# Patient Record
Sex: Female | Born: 1945 | Race: White | Hispanic: No | Smoking: Never smoker
Health system: Southern US, Community
[De-identification: ages and names within clinical notes are randomized; demographics above are authoritative.]

## PROBLEM LIST (undated history)

## (undated) DIAGNOSIS — I471 Supraventricular tachycardia, unspecified: Secondary | ICD-10-CM

## (undated) DIAGNOSIS — N390 Urinary tract infection, site not specified: Secondary | ICD-10-CM

## (undated) DIAGNOSIS — E349 Endocrine disorder, unspecified: Secondary | ICD-10-CM

## (undated) DIAGNOSIS — K5792 Diverticulitis of intestine, part unspecified, without perforation or abscess without bleeding: Secondary | ICD-10-CM

## (undated) DIAGNOSIS — E039 Hypothyroidism, unspecified: Secondary | ICD-10-CM

## (undated) DIAGNOSIS — I340 Nonrheumatic mitral (valve) insufficiency: Secondary | ICD-10-CM

## (undated) DIAGNOSIS — U071 COVID-19: Secondary | ICD-10-CM

## (undated) DIAGNOSIS — A419 Sepsis, unspecified organism: Secondary | ICD-10-CM

## (undated) DIAGNOSIS — B019 Varicella without complication: Secondary | ICD-10-CM

## (undated) DIAGNOSIS — I517 Cardiomegaly: Secondary | ICD-10-CM

## (undated) DIAGNOSIS — I491 Atrial premature depolarization: Secondary | ICD-10-CM

## (undated) DIAGNOSIS — M810 Age-related osteoporosis without current pathological fracture: Secondary | ICD-10-CM

## (undated) DIAGNOSIS — Z923 Personal history of irradiation: Secondary | ICD-10-CM

## (undated) DIAGNOSIS — I493 Ventricular premature depolarization: Secondary | ICD-10-CM

## (undated) DIAGNOSIS — K219 Gastro-esophageal reflux disease without esophagitis: Secondary | ICD-10-CM

## (undated) DIAGNOSIS — E079 Disorder of thyroid, unspecified: Secondary | ICD-10-CM

## (undated) DIAGNOSIS — K209 Esophagitis, unspecified without bleeding: Secondary | ICD-10-CM

## (undated) DIAGNOSIS — E785 Hyperlipidemia, unspecified: Secondary | ICD-10-CM

## (undated) DIAGNOSIS — M1990 Unspecified osteoarthritis, unspecified site: Secondary | ICD-10-CM

## (undated) DIAGNOSIS — C50919 Malignant neoplasm of unspecified site of unspecified female breast: Secondary | ICD-10-CM

## (undated) DIAGNOSIS — I10 Essential (primary) hypertension: Secondary | ICD-10-CM

## (undated) DIAGNOSIS — Z87442 Personal history of urinary calculi: Secondary | ICD-10-CM

## (undated) DIAGNOSIS — K5790 Diverticulosis of intestine, part unspecified, without perforation or abscess without bleeding: Secondary | ICD-10-CM

## (undated) HISTORY — DX: COVID-19: U07.1

## (undated) HISTORY — DX: Urinary tract infection, site not specified: N39.0

## (undated) HISTORY — PX: BONE GRAFT HIP ILIAC CREST: SUR159

## (undated) HISTORY — PX: TONSILLECTOMY: SUR1361

## (undated) HISTORY — DX: Endocrine disorder, unspecified: E34.9

## (undated) HISTORY — PX: HERNIA REPAIR: SHX51

## (undated) HISTORY — PX: TUBAL LIGATION: SHX77

## (undated) HISTORY — DX: Disorder of thyroid, unspecified: E07.9

## (undated) HISTORY — PX: JOINT REPLACEMENT: SHX530

## (undated) HISTORY — DX: Diverticulosis of intestine, part unspecified, without perforation or abscess without bleeding: K57.90

## (undated) HISTORY — DX: Essential (primary) hypertension: I10

## (undated) HISTORY — PX: CARDIAC CATHETERIZATION: SHX172

## (undated) HISTORY — PX: SPINAL FUSION: SHX223

## (undated) HISTORY — PX: SHOULDER SURGERY: SHX246

## (undated) HISTORY — DX: Hyperlipidemia, unspecified: E78.5

## (undated) HISTORY — DX: Esophagitis, unspecified without bleeding: K20.90

## (undated) HISTORY — DX: Gastro-esophageal reflux disease without esophagitis: K21.9

## (undated) HISTORY — DX: Malignant neoplasm of unspecified site of unspecified female breast: C50.919

## (undated) HISTORY — DX: Unspecified osteoarthritis, unspecified site: M19.90

## (undated) HISTORY — PX: OTHER SURGICAL HISTORY: SHX169

## (undated) HISTORY — DX: Varicella without complication: B01.9

## (undated) HISTORY — PX: SPINE SURGERY: SHX786

## (undated) HISTORY — PX: TOTAL SHOULDER REPLACEMENT: SUR1217

## (undated) HISTORY — PX: EYE SURGERY: SHX253

---

## 1999-11-26 HISTORY — PX: CARDIAC CATHETERIZATION: SHX172

## 2007-11-26 DIAGNOSIS — Z923 Personal history of irradiation: Secondary | ICD-10-CM

## 2007-11-26 DIAGNOSIS — C50919 Malignant neoplasm of unspecified site of unspecified female breast: Secondary | ICD-10-CM

## 2007-11-26 HISTORY — PX: BREAST LUMPECTOMY: SHX2

## 2007-11-26 HISTORY — DX: Personal history of irradiation: Z92.3

## 2007-11-26 HISTORY — DX: Malignant neoplasm of unspecified site of unspecified female breast: C50.919

## 2007-11-26 HISTORY — PX: BREAST BIOPSY: SHX20

## 2010-11-25 HISTORY — PX: TOTAL SHOULDER REPLACEMENT: SUR1217

## 2012-06-03 DIAGNOSIS — Z833 Family history of diabetes mellitus: Secondary | ICD-10-CM | POA: Insufficient documentation

## 2012-06-03 DIAGNOSIS — Z8269 Family history of other diseases of the musculoskeletal system and connective tissue: Secondary | ICD-10-CM | POA: Insufficient documentation

## 2012-06-03 DIAGNOSIS — M4310 Spondylolisthesis, site unspecified: Secondary | ICD-10-CM | POA: Insufficient documentation

## 2012-06-03 DIAGNOSIS — C801 Malignant (primary) neoplasm, unspecified: Secondary | ICD-10-CM | POA: Insufficient documentation

## 2012-06-03 DIAGNOSIS — M48061 Spinal stenosis, lumbar region without neurogenic claudication: Secondary | ICD-10-CM | POA: Insufficient documentation

## 2013-02-15 DIAGNOSIS — I10 Essential (primary) hypertension: Secondary | ICD-10-CM | POA: Insufficient documentation

## 2013-02-15 DIAGNOSIS — E039 Hypothyroidism, unspecified: Secondary | ICD-10-CM | POA: Insufficient documentation

## 2013-02-15 DIAGNOSIS — E785 Hyperlipidemia, unspecified: Secondary | ICD-10-CM | POA: Insufficient documentation

## 2013-02-15 DIAGNOSIS — J309 Allergic rhinitis, unspecified: Secondary | ICD-10-CM | POA: Insufficient documentation

## 2014-05-17 LAB — HM COLONOSCOPY

## 2014-06-14 DIAGNOSIS — G609 Hereditary and idiopathic neuropathy, unspecified: Secondary | ICD-10-CM | POA: Insufficient documentation

## 2015-03-29 DIAGNOSIS — N200 Calculus of kidney: Secondary | ICD-10-CM | POA: Insufficient documentation

## 2015-11-08 DIAGNOSIS — M5417 Radiculopathy, lumbosacral region: Secondary | ICD-10-CM | POA: Insufficient documentation

## 2015-11-08 DIAGNOSIS — M47817 Spondylosis without myelopathy or radiculopathy, lumbosacral region: Secondary | ICD-10-CM | POA: Insufficient documentation

## 2015-11-08 DIAGNOSIS — M51369 Other intervertebral disc degeneration, lumbar region without mention of lumbar back pain or lower extremity pain: Secondary | ICD-10-CM | POA: Insufficient documentation

## 2015-11-08 DIAGNOSIS — M5136 Other intervertebral disc degeneration, lumbar region: Secondary | ICD-10-CM | POA: Insufficient documentation

## 2016-11-25 HISTORY — PX: BONE GRAFT HIP ILIAC CREST: SUR159

## 2016-12-06 DIAGNOSIS — I10 Essential (primary) hypertension: Secondary | ICD-10-CM | POA: Diagnosis not present

## 2016-12-06 DIAGNOSIS — R197 Diarrhea, unspecified: Secondary | ICD-10-CM | POA: Diagnosis not present

## 2016-12-06 DIAGNOSIS — K4020 Bilateral inguinal hernia, without obstruction or gangrene, not specified as recurrent: Secondary | ICD-10-CM | POA: Diagnosis not present

## 2016-12-06 DIAGNOSIS — K5790 Diverticulosis of intestine, part unspecified, without perforation or abscess without bleeding: Secondary | ICD-10-CM | POA: Diagnosis not present

## 2016-12-06 DIAGNOSIS — R1030 Lower abdominal pain, unspecified: Secondary | ICD-10-CM | POA: Diagnosis not present

## 2016-12-06 DIAGNOSIS — R1031 Right lower quadrant pain: Secondary | ICD-10-CM | POA: Diagnosis not present

## 2016-12-10 DIAGNOSIS — R1031 Right lower quadrant pain: Secondary | ICD-10-CM | POA: Diagnosis not present

## 2016-12-10 DIAGNOSIS — R197 Diarrhea, unspecified: Secondary | ICD-10-CM | POA: Diagnosis not present

## 2016-12-10 DIAGNOSIS — R1030 Lower abdominal pain, unspecified: Secondary | ICD-10-CM | POA: Diagnosis not present

## 2016-12-11 DIAGNOSIS — S52515D Nondisplaced fracture of left radial styloid process, subsequent encounter for closed fracture with routine healing: Secondary | ICD-10-CM | POA: Diagnosis not present

## 2016-12-11 DIAGNOSIS — S63522D Sprain of radiocarpal joint of left wrist, subsequent encounter: Secondary | ICD-10-CM | POA: Diagnosis not present

## 2016-12-11 DIAGNOSIS — M25532 Pain in left wrist: Secondary | ICD-10-CM | POA: Diagnosis not present

## 2016-12-26 DIAGNOSIS — M545 Low back pain: Secondary | ICD-10-CM | POA: Diagnosis not present

## 2016-12-26 DIAGNOSIS — M542 Cervicalgia: Secondary | ICD-10-CM | POA: Diagnosis not present

## 2016-12-26 DIAGNOSIS — M4186 Other forms of scoliosis, lumbar region: Secondary | ICD-10-CM | POA: Diagnosis not present

## 2016-12-31 DIAGNOSIS — M5416 Radiculopathy, lumbar region: Secondary | ICD-10-CM | POA: Diagnosis not present

## 2016-12-31 DIAGNOSIS — M4720 Other spondylosis with radiculopathy, site unspecified: Secondary | ICD-10-CM | POA: Diagnosis not present

## 2016-12-31 DIAGNOSIS — M5137 Other intervertebral disc degeneration, lumbosacral region: Secondary | ICD-10-CM | POA: Diagnosis not present

## 2017-01-06 DIAGNOSIS — S63522D Sprain of radiocarpal joint of left wrist, subsequent encounter: Secondary | ICD-10-CM | POA: Diagnosis not present

## 2017-01-06 DIAGNOSIS — S52515D Nondisplaced fracture of left radial styloid process, subsequent encounter for closed fracture with routine healing: Secondary | ICD-10-CM | POA: Diagnosis not present

## 2017-01-06 DIAGNOSIS — M1812 Unilateral primary osteoarthritis of first carpometacarpal joint, left hand: Secondary | ICD-10-CM | POA: Diagnosis not present

## 2017-01-08 DIAGNOSIS — L538 Other specified erythematous conditions: Secondary | ICD-10-CM | POA: Diagnosis not present

## 2017-01-08 DIAGNOSIS — D2239 Melanocytic nevi of other parts of face: Secondary | ICD-10-CM | POA: Diagnosis not present

## 2017-01-08 DIAGNOSIS — L814 Other melanin hyperpigmentation: Secondary | ICD-10-CM | POA: Diagnosis not present

## 2017-01-08 DIAGNOSIS — R208 Other disturbances of skin sensation: Secondary | ICD-10-CM | POA: Diagnosis not present

## 2017-01-08 DIAGNOSIS — L218 Other seborrheic dermatitis: Secondary | ICD-10-CM | POA: Diagnosis not present

## 2017-01-08 DIAGNOSIS — L820 Inflamed seborrheic keratosis: Secondary | ICD-10-CM | POA: Diagnosis not present

## 2017-01-08 DIAGNOSIS — L821 Other seborrheic keratosis: Secondary | ICD-10-CM | POA: Diagnosis not present

## 2017-01-16 DIAGNOSIS — M4186 Other forms of scoliosis, lumbar region: Secondary | ICD-10-CM | POA: Diagnosis not present

## 2017-02-14 DIAGNOSIS — M654 Radial styloid tenosynovitis [de Quervain]: Secondary | ICD-10-CM | POA: Diagnosis not present

## 2017-02-28 DIAGNOSIS — E039 Hypothyroidism, unspecified: Secondary | ICD-10-CM | POA: Diagnosis not present

## 2017-02-28 DIAGNOSIS — R1032 Left lower quadrant pain: Secondary | ICD-10-CM | POA: Diagnosis not present

## 2017-02-28 DIAGNOSIS — I10 Essential (primary) hypertension: Secondary | ICD-10-CM | POA: Diagnosis not present

## 2017-02-28 DIAGNOSIS — E785 Hyperlipidemia, unspecified: Secondary | ICD-10-CM | POA: Diagnosis not present

## 2017-03-11 DIAGNOSIS — M545 Low back pain: Secondary | ICD-10-CM | POA: Diagnosis not present

## 2017-03-11 DIAGNOSIS — Z0000 Encounter for general adult medical examination without abnormal findings: Secondary | ICD-10-CM | POA: Diagnosis not present

## 2017-03-11 DIAGNOSIS — E785 Hyperlipidemia, unspecified: Secondary | ICD-10-CM | POA: Diagnosis not present

## 2017-03-11 DIAGNOSIS — I10 Essential (primary) hypertension: Secondary | ICD-10-CM | POA: Diagnosis not present

## 2017-03-11 DIAGNOSIS — E039 Hypothyroidism, unspecified: Secondary | ICD-10-CM | POA: Diagnosis not present

## 2017-03-11 DIAGNOSIS — Z6828 Body mass index (BMI) 28.0-28.9, adult: Secondary | ICD-10-CM | POA: Diagnosis not present

## 2017-03-11 DIAGNOSIS — Z1231 Encounter for screening mammogram for malignant neoplasm of breast: Secondary | ICD-10-CM | POA: Diagnosis not present

## 2017-04-01 DIAGNOSIS — I10 Essential (primary) hypertension: Secondary | ICD-10-CM | POA: Diagnosis not present

## 2017-04-01 DIAGNOSIS — Z7951 Long term (current) use of inhaled steroids: Secondary | ICD-10-CM | POA: Diagnosis not present

## 2017-04-01 DIAGNOSIS — M4316 Spondylolisthesis, lumbar region: Secondary | ICD-10-CM | POA: Diagnosis not present

## 2017-04-01 DIAGNOSIS — M79651 Pain in right thigh: Secondary | ICD-10-CM | POA: Diagnosis not present

## 2017-04-01 DIAGNOSIS — Z79891 Long term (current) use of opiate analgesic: Secondary | ICD-10-CM | POA: Diagnosis not present

## 2017-04-01 DIAGNOSIS — M4184 Other forms of scoliosis, thoracic region: Secondary | ICD-10-CM | POA: Diagnosis not present

## 2017-04-01 DIAGNOSIS — Z8249 Family history of ischemic heart disease and other diseases of the circulatory system: Secondary | ICD-10-CM | POA: Diagnosis not present

## 2017-04-01 DIAGNOSIS — M5136 Other intervertebral disc degeneration, lumbar region: Secondary | ICD-10-CM | POA: Diagnosis not present

## 2017-04-01 DIAGNOSIS — M1990 Unspecified osteoarthritis, unspecified site: Secondary | ICD-10-CM | POA: Diagnosis not present

## 2017-04-01 DIAGNOSIS — M4185 Other forms of scoliosis, thoracolumbar region: Secondary | ICD-10-CM | POA: Diagnosis not present

## 2017-04-01 DIAGNOSIS — Z79899 Other long term (current) drug therapy: Secondary | ICD-10-CM | POA: Diagnosis not present

## 2017-04-01 DIAGNOSIS — Z8262 Family history of osteoporosis: Secondary | ICD-10-CM | POA: Diagnosis not present

## 2017-04-01 DIAGNOSIS — Z87440 Personal history of urinary (tract) infections: Secondary | ICD-10-CM | POA: Diagnosis not present

## 2017-04-01 DIAGNOSIS — M79652 Pain in left thigh: Secondary | ICD-10-CM | POA: Diagnosis not present

## 2017-04-01 DIAGNOSIS — Z809 Family history of malignant neoplasm, unspecified: Secondary | ICD-10-CM | POA: Diagnosis not present

## 2017-04-01 DIAGNOSIS — Z882 Allergy status to sulfonamides status: Secondary | ICD-10-CM | POA: Diagnosis not present

## 2017-04-01 DIAGNOSIS — Z6828 Body mass index (BMI) 28.0-28.9, adult: Secondary | ICD-10-CM | POA: Diagnosis not present

## 2017-04-01 DIAGNOSIS — E079 Disorder of thyroid, unspecified: Secondary | ICD-10-CM | POA: Diagnosis not present

## 2017-04-01 DIAGNOSIS — M545 Low back pain: Secondary | ICD-10-CM | POA: Diagnosis not present

## 2017-04-01 DIAGNOSIS — Z859 Personal history of malignant neoplasm, unspecified: Secondary | ICD-10-CM | POA: Diagnosis not present

## 2017-04-01 DIAGNOSIS — C9001 Multiple myeloma in remission: Secondary | ICD-10-CM | POA: Diagnosis not present

## 2017-04-04 DIAGNOSIS — N390 Urinary tract infection, site not specified: Secondary | ICD-10-CM | POA: Diagnosis not present

## 2017-04-04 DIAGNOSIS — R109 Unspecified abdominal pain: Secondary | ICD-10-CM | POA: Diagnosis not present

## 2017-04-10 DIAGNOSIS — Z1231 Encounter for screening mammogram for malignant neoplasm of breast: Secondary | ICD-10-CM | POA: Diagnosis not present

## 2017-04-10 LAB — HM MAMMOGRAPHY

## 2017-04-29 DIAGNOSIS — Z01419 Encounter for gynecological examination (general) (routine) without abnormal findings: Secondary | ICD-10-CM | POA: Diagnosis not present

## 2017-04-29 DIAGNOSIS — N8110 Cystocele, unspecified: Secondary | ICD-10-CM | POA: Diagnosis not present

## 2017-04-29 DIAGNOSIS — N390 Urinary tract infection, site not specified: Secondary | ICD-10-CM | POA: Diagnosis not present

## 2017-04-29 DIAGNOSIS — Z6828 Body mass index (BMI) 28.0-28.9, adult: Secondary | ICD-10-CM | POA: Diagnosis not present

## 2017-05-05 DIAGNOSIS — N390 Urinary tract infection, site not specified: Secondary | ICD-10-CM | POA: Diagnosis not present

## 2017-05-29 DIAGNOSIS — M48061 Spinal stenosis, lumbar region without neurogenic claudication: Secondary | ICD-10-CM | POA: Diagnosis not present

## 2017-05-29 DIAGNOSIS — G8929 Other chronic pain: Secondary | ICD-10-CM | POA: Diagnosis not present

## 2017-05-29 DIAGNOSIS — M5417 Radiculopathy, lumbosacral region: Secondary | ICD-10-CM | POA: Diagnosis not present

## 2017-05-29 DIAGNOSIS — M47817 Spondylosis without myelopathy or radiculopathy, lumbosacral region: Secondary | ICD-10-CM | POA: Diagnosis not present

## 2017-06-03 DIAGNOSIS — N390 Urinary tract infection, site not specified: Secondary | ICD-10-CM | POA: Diagnosis not present

## 2017-06-16 DIAGNOSIS — M48061 Spinal stenosis, lumbar region without neurogenic claudication: Secondary | ICD-10-CM | POA: Diagnosis not present

## 2017-07-15 DIAGNOSIS — R300 Dysuria: Secondary | ICD-10-CM | POA: Diagnosis not present

## 2017-07-15 DIAGNOSIS — Z87442 Personal history of urinary calculi: Secondary | ICD-10-CM | POA: Diagnosis not present

## 2017-07-15 DIAGNOSIS — N200 Calculus of kidney: Secondary | ICD-10-CM | POA: Diagnosis not present

## 2017-07-15 DIAGNOSIS — Z87440 Personal history of urinary (tract) infections: Secondary | ICD-10-CM | POA: Diagnosis not present

## 2017-07-21 DIAGNOSIS — N390 Urinary tract infection, site not specified: Secondary | ICD-10-CM | POA: Diagnosis not present

## 2017-07-21 DIAGNOSIS — N200 Calculus of kidney: Secondary | ICD-10-CM | POA: Diagnosis not present

## 2017-07-21 DIAGNOSIS — Z87440 Personal history of urinary (tract) infections: Secondary | ICD-10-CM | POA: Diagnosis not present

## 2017-07-21 DIAGNOSIS — R300 Dysuria: Secondary | ICD-10-CM | POA: Diagnosis not present

## 2017-07-30 DIAGNOSIS — N898 Other specified noninflammatory disorders of vagina: Secondary | ICD-10-CM | POA: Diagnosis not present

## 2017-07-30 DIAGNOSIS — Z87440 Personal history of urinary (tract) infections: Secondary | ICD-10-CM | POA: Diagnosis not present

## 2017-07-30 DIAGNOSIS — R8290 Unspecified abnormal findings in urine: Secondary | ICD-10-CM | POA: Diagnosis not present

## 2017-08-02 DIAGNOSIS — Z23 Encounter for immunization: Secondary | ICD-10-CM | POA: Diagnosis not present

## 2017-08-03 IMAGING — US US EXTREM LOW VENOUS*L*
1 series · 13 of 24 positions shown · non-contrast
Comparison: None.

CLINICAL DATA: Left lower extremity swelling and pain



[Series 1: us extrem low venous*left* · 0.06mm/px · 13 of 42 slices shown]
[im 1/42]
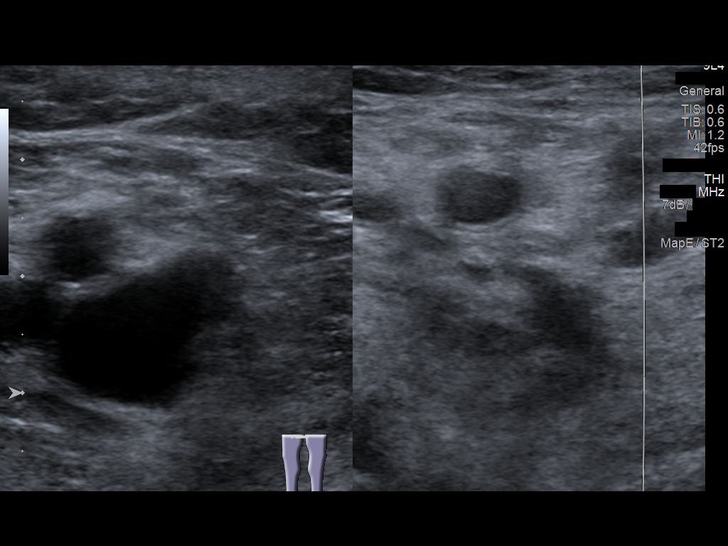
[im 4/42]
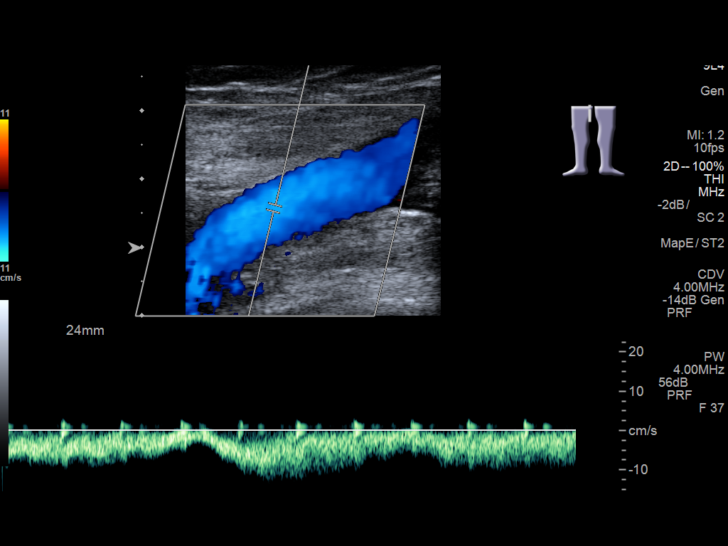
[im 8/42]
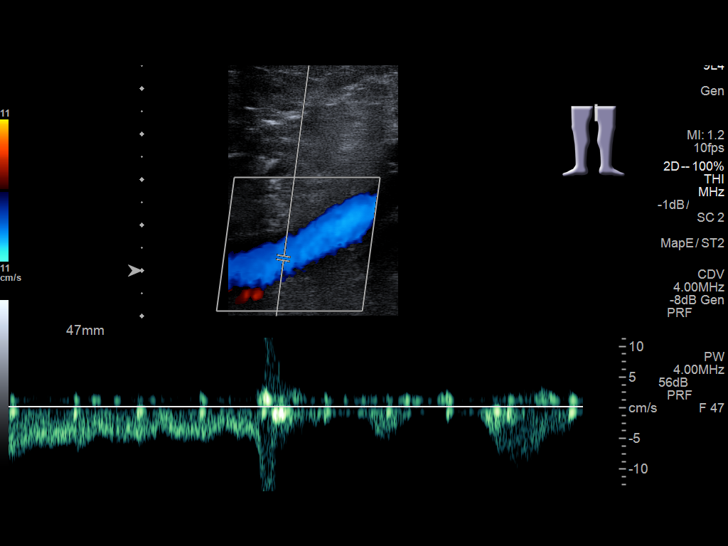
[im 11/42]
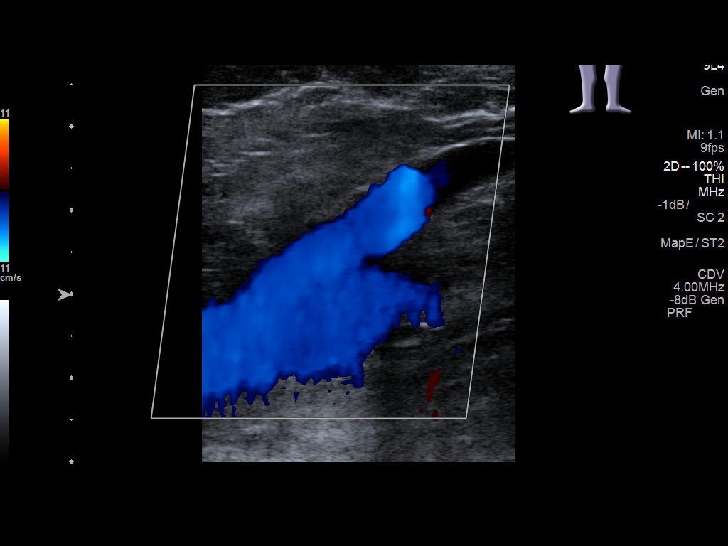
[im 15/42]
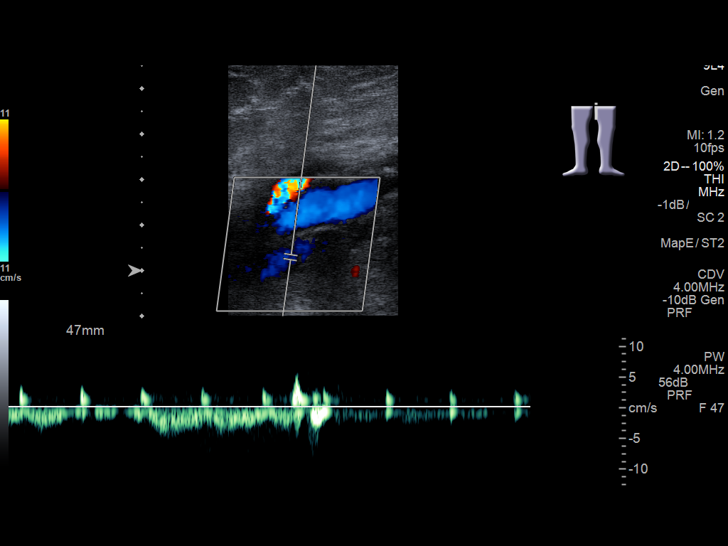
[im 18/42]
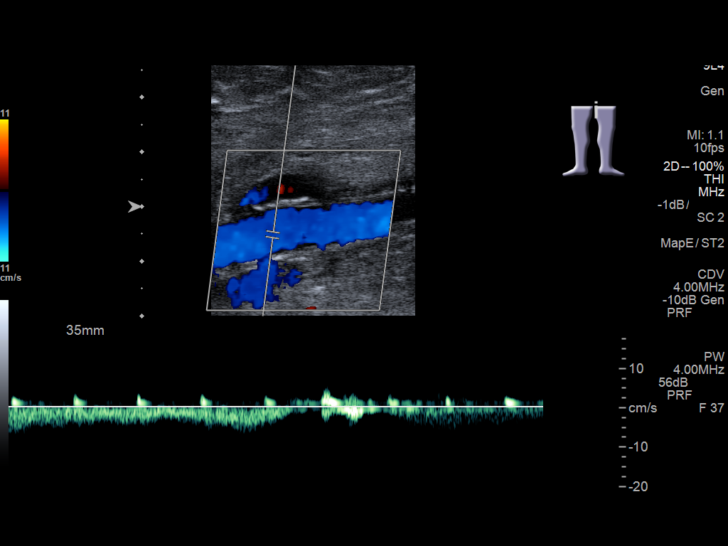
[im 22/42]
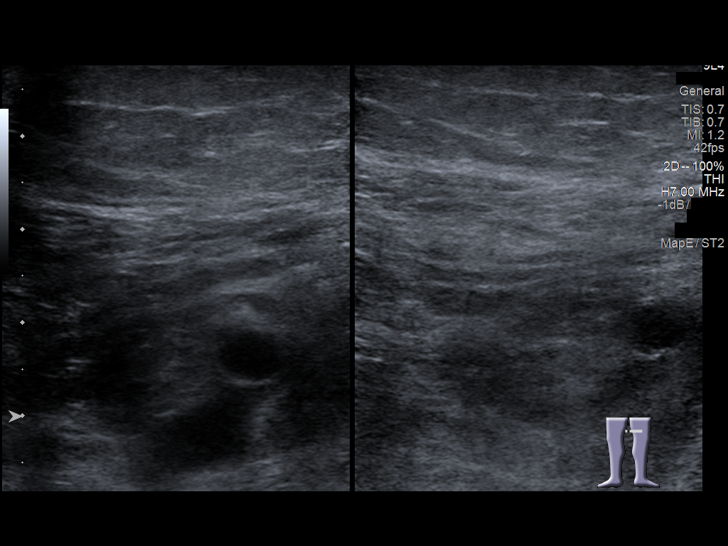
[im 24/42]
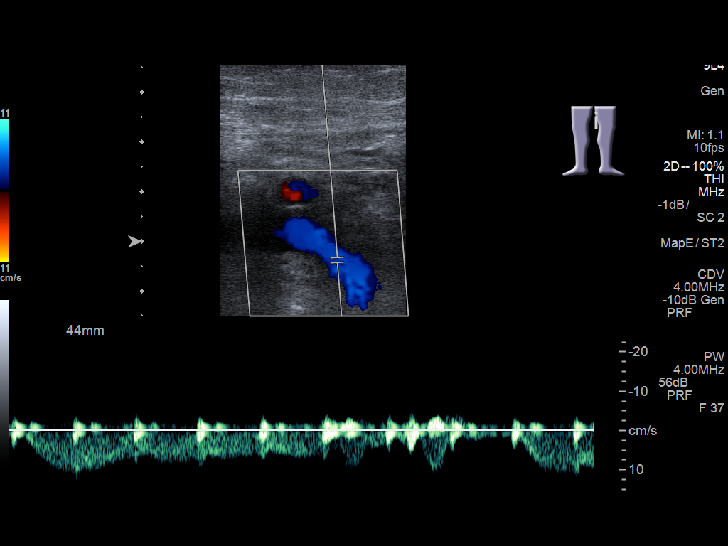
[im 27/42]
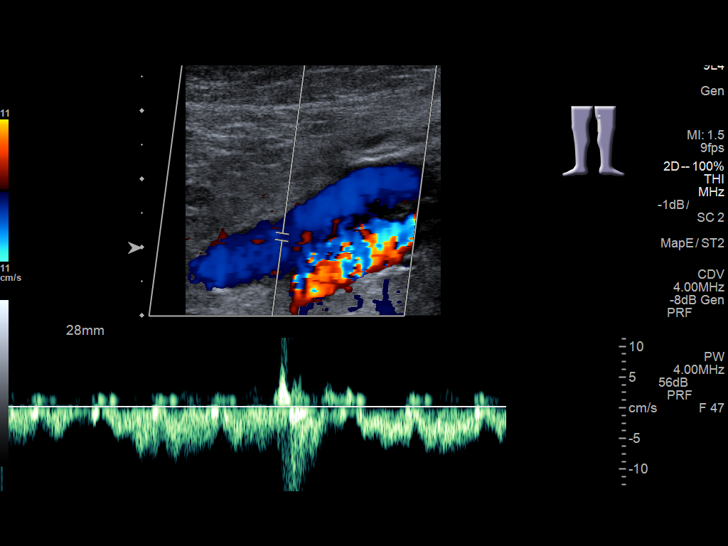
[im 31/42]
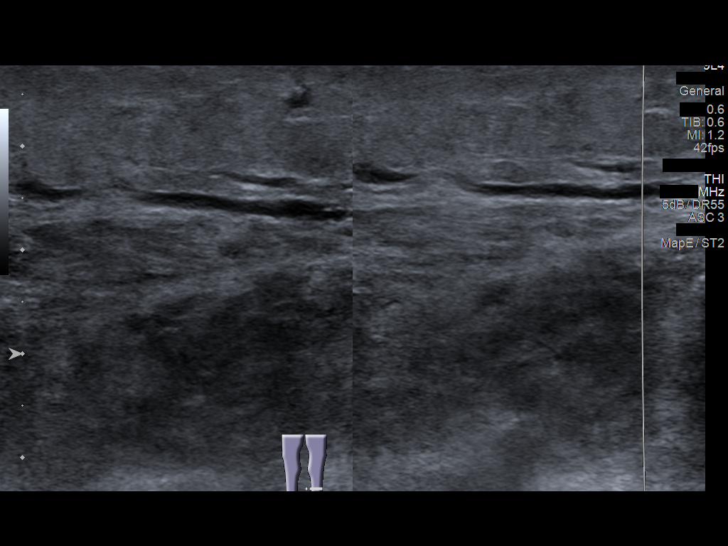
[im 34/42]
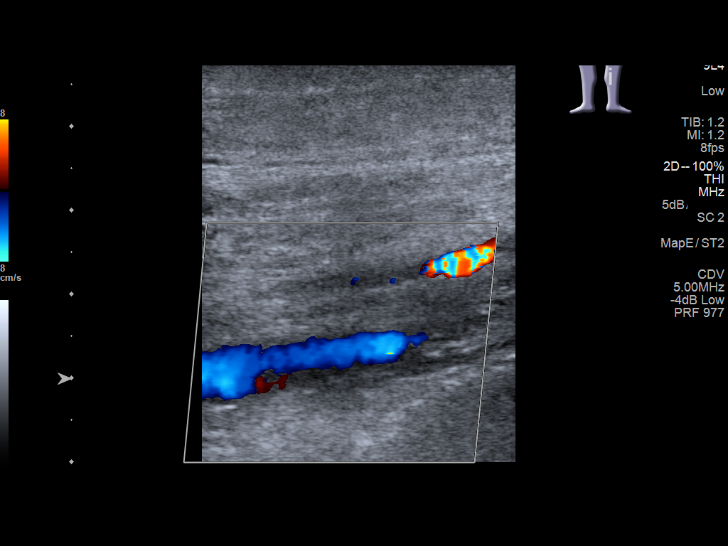
[im 38/42]
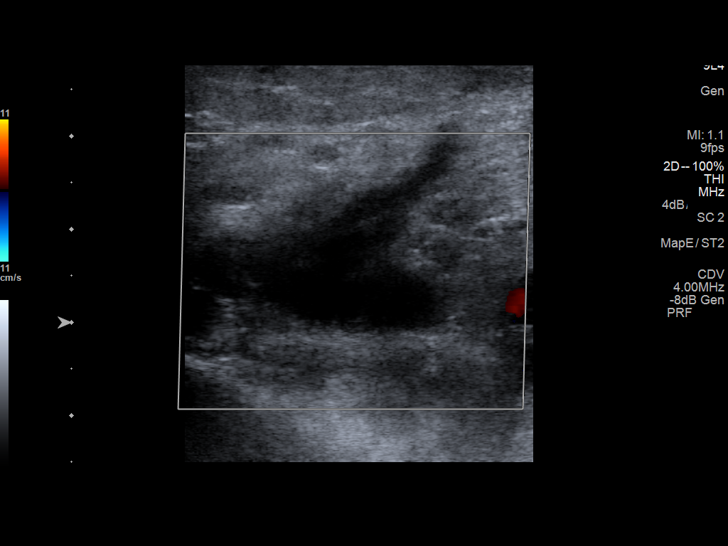
[im 42/42]
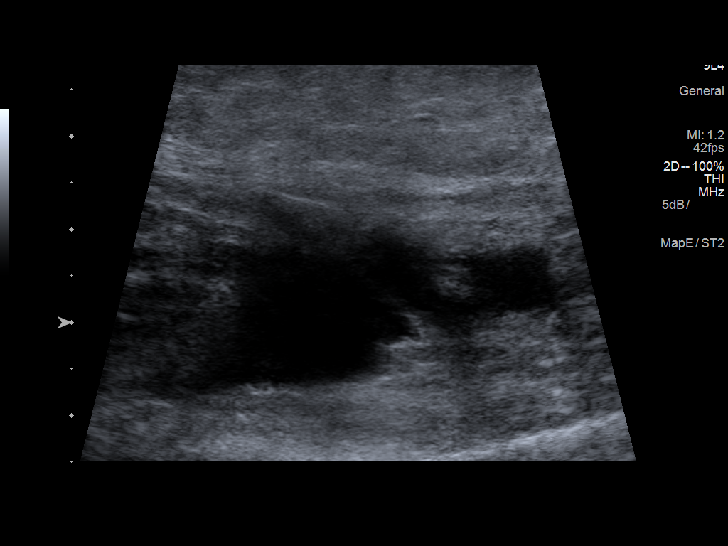

[13 of 24 positions shown; findings below may reference images not displayed]

FINDINGS: Contralateral Common Femoral Vein: Respiratory phasicity is normal
and symmetric with the symptomatic side. No evidence of thrombus.
Normal compressibility.

Common Femoral Vein: No evidence of thrombus. Normal
compressibility, respiratory phasicity and response to augmentation.

Saphenofemoral Junction: No evidence of thrombus. Normal
compressibility and flow on color Doppler imaging.

Profunda Femoral Vein: No evidence of thrombus. Normal
compressibility and flow on color Doppler imaging.

Femoral Vein: No evidence of thrombus. Normal compressibility,
respiratory phasicity and response to augmentation.

Popliteal Vein: No evidence of thrombus. Normal compressibility,
respiratory phasicity and response to augmentation.

Calf Veins: No evidence of thrombus. Normal compressibility and flow
on color Doppler imaging.

Superficial Great Saphenous Vein: No evidence of thrombus. Normal
compressibility.

Venous Reflux:  None.

Other Findings: 4.1 x 1.3 x 2.5 cm complex thick-walled cystic area
in the popliteal fossa compatible with a Bakers cyst.
IMPRESSION: Negative for significant occlusive left lower extremity DVT.

4.1 cm complex left Baker's cyst

## 2017-08-10 DIAGNOSIS — L509 Urticaria, unspecified: Secondary | ICD-10-CM | POA: Diagnosis not present

## 2017-08-12 DIAGNOSIS — E7800 Pure hypercholesterolemia, unspecified: Secondary | ICD-10-CM | POA: Diagnosis not present

## 2017-08-12 DIAGNOSIS — L509 Urticaria, unspecified: Secondary | ICD-10-CM | POA: Diagnosis not present

## 2017-08-12 DIAGNOSIS — R0789 Other chest pain: Secondary | ICD-10-CM | POA: Diagnosis not present

## 2017-08-12 DIAGNOSIS — I10 Essential (primary) hypertension: Secondary | ICD-10-CM | POA: Diagnosis not present

## 2017-09-09 DIAGNOSIS — N200 Calculus of kidney: Secondary | ICD-10-CM | POA: Diagnosis not present

## 2017-09-09 DIAGNOSIS — Z87440 Personal history of urinary (tract) infections: Secondary | ICD-10-CM | POA: Diagnosis not present

## 2017-09-09 DIAGNOSIS — R8290 Unspecified abnormal findings in urine: Secondary | ICD-10-CM | POA: Diagnosis not present

## 2017-09-18 DIAGNOSIS — M4152 Other secondary scoliosis, cervical region: Secondary | ICD-10-CM | POA: Insufficient documentation

## 2017-09-18 DIAGNOSIS — M4182 Other forms of scoliosis, cervical region: Secondary | ICD-10-CM | POA: Insufficient documentation

## 2017-09-29 ENCOUNTER — Ambulatory Visit (INDEPENDENT_AMBULATORY_CARE_PROVIDER_SITE_OTHER): Payer: Medicare Other | Admitting: Urology

## 2017-09-29 ENCOUNTER — Encounter: Payer: Self-pay | Admitting: Urology

## 2017-09-29 VITALS — BP 150/90 | HR 92 | Ht 59.0 in | Wt 132.6 lb

## 2017-09-29 DIAGNOSIS — N390 Urinary tract infection, site not specified: Secondary | ICD-10-CM

## 2017-09-29 LAB — URINALYSIS, COMPLETE
Bilirubin, UA: NEGATIVE
Glucose, UA: NEGATIVE
Ketones, UA: NEGATIVE
Leukocytes, UA: NEGATIVE
Nitrite, UA: NEGATIVE
Protein, UA: NEGATIVE
RBC, UA: NEGATIVE
Specific Gravity, UA: 1.01 (ref 1.005–1.030)
Urobilinogen, Ur: 0.2 mg/dL (ref 0.2–1.0)
pH, UA: 6 (ref 5.0–7.5)

## 2017-09-29 MED ORDER — NITROFURANTOIN MONOHYD MACRO 100 MG PO CAPS
100.0000 mg | ORAL_CAPSULE | Freq: Every day | ORAL | 3 refills | Status: DC
Start: 2017-09-29 — End: 2018-05-28

## 2017-09-29 NOTE — Progress Notes (Signed)
09/29/2017 11:28 AM   Maria Spencer 1945/12/20 [id number]  Referring provider: No referring provider defined for this encounter.  Chief Complaint  Patient presents with  . Recurrent UTI    HPI: The patient was consulted to [place] for recurrent urinary tract infections.  I went through some of her medical records.  She has had positive cultures resistant to antibiotics.  She had a kidney stone in 2016.  I believe in 2015 she had a cystoscopy and renal ultrasound.  She had been placed on trimethoprim for 90 days and had a sling 25 years ago.  She had a negative CAT scan in May 2016.  She is noted to have a small cystocele.  The patient describes pressure and bloating and small volume frequency that generally respond to antibiotics.  She can get an infection every several weeks.  She was on Macrodantin daily for 30 days.  10 days after stopping it she once again had another infection  At baseline she voids every 2 hours and is continent.  She gets up 3-4 times a night and denies ankle edema and does not take Lasix.  She is having back surgery on her lower back at the end of the month  Modifying factors: There are no other modifying factors  Associated signs and symptoms: There are no other associated signs and symptoms Aggravating and relieving factors: There are no other aggravating or relieving factors Severity: Moderate Duration: Persistent     PMH: Past Medical History:  Diagnosis Date  . Arthritis   . Breast cancer ([place])   . GERD (gastroesophageal reflux disease)   . Hormone disorder   . Hyperlipidemia   . Hypertension   . Thyroid disease     Surgical History: Past Surgical History:  Procedure Laterality Date  . BREAST LUMPECTOMY    . TOTAL SHOULDER REPLACEMENT      Home Medications:  Allergies as of 09/29/2017      Reactions   Sulfa Antibiotics Itching      Medication List        Accurate as of 09/29/17 11:28 AM. Always use your most recent med list.          atorvastatin 20 MG tablet Commonly known as:  LIPITOR   fluconazole 150 MG tablet Commonly known as:  DIFLUCAN   levothyroxine 88 MCG tablet Commonly known as:  SYNTHROID, LEVOTHROID Take by mouth.   lisinopril 20 MG tablet Commonly known as:  PRINIVIL,ZESTRIL Take by mouth.   MULTI-VITAMINS Tabs Take by mouth.   saccharomyces boulardii 250 MG capsule Commonly known as:  FLORASTOR Take 250 mg 2 (two) times daily by mouth.   zolpidem 5 MG tablet Commonly known as:  AMBIEN Take by mouth.       Allergies:  Allergies  Allergen Reactions  . Sulfa Antibiotics Itching    Family History: Family History  Problem Relation Age of Onset  . Bladder Cancer Neg Hx   . Kidney cancer Neg Hx     Social History:  reports that  has never smoked. she has never used smokeless tobacco. She reports that she drinks alcohol. She reports that she does not use drugs.  ROS: UROLOGY Frequent Urination?: No Hard to postpone urination?: No Burning/pain with urination?: Yes Get up at night to urinate?: Yes Leakage of urine?: No Urine stream starts and stops?: No Trouble starting stream?: Yes Do you have to strain to urinate?: No Blood in urine?: No Urinary tract infection?: Yes Sexually transmitted disease?: No Injury  to kidneys or bladder?: No Painful intercourse?: No Weak stream?: No Currently pregnant?: No Vaginal bleeding?: No Last menstrual period?: n  Gastrointestinal Nausea?: Yes Vomiting?: No Indigestion/heartburn?: Yes Diarrhea?: Yes Constipation?: No  Constitutional Fever: No Night sweats?: No Weight loss?: No Fatigue?: Yes  Skin Skin rash/lesions?: No Itching?: No  Eyes Blurred vision?: No Double vision?: No  Ears/Nose/Throat Sore throat?: No Sinus problems?: Yes  Hematologic/Lymphatic Swollen glands?: No Easy bruising?: No  Cardiovascular Leg swelling?: No Chest pain?: No  Respiratory Cough?: No Shortness of breath?:  No  Endocrine Excessive thirst?: No  Musculoskeletal Back pain?: Yes Joint pain?: Yes  Neurological Headaches?: Yes Dizziness?: No  Psychologic Depression?: No Anxiety?: No  Physical Exam: BP (!) 150/90 (BP Location: Left Arm, Patient Position: Sitting, Cuff Size: Normal)   Pulse 92   Ht 4\' 11"  (1.499 m)   Wt 132 lb 9.6 oz (60.1 kg)   BMI 26.78 kg/m   Constitutional:  Alert and oriented, No acute distress. HEENT: [place] AT, moist mucus membranes.  Trachea midline, no masses. Cardiovascular: No clubbing, cyanosis, or edema. Respiratory: Normal respiratory effort, no increased work of breathing. GI: Abdomen is soft, nontender, nondistended, no abdominal masses GU: Grade 2 cystocele; cervix descended approximately 2 cm; no stress incontinence Skin: No rashes, bruises or suspicious lesions. Lymph: No cervical or inguinal adenopathy. Neurologic: Grossly intact, no focal deficits, moving all 4 extremities. Psychiatric: Normal mood and affect.  Laboratory Data:  Urinalysis No results found for: COLORURINE, APPEARANCEUR, LABSPEC, PHURINE, GLUCOSEU, HGBUR, BILIRUBINUR, KETONESUR, PROTEINUR, UROBILINOGEN, NITRITE, LEUKOCYTESUR  Pertinent Imaging: none  Assessment & Plan: The pathophysiology of recurrent urinary tract infections in the need for long-term prophylaxis on a daily basis discussed.  I will send her urine for culture today.  In the future I would re-baseline her nighttime frequency and treatment goals.  We talked about resistance issues.  It has been over one year but I do not think she needs another kidney x-ray.  The patient has little to no symptoms and possibly some mild fullness from her cystocele.  The patient thought that maybe trimethoprim did not work as well but I think she was self treating herself as opposed to taking it prophylactically.  Having said that we gave her 75-month supply of daily Macrodantin with 3 refills and I will reassess her in about 10 weeks  after her back surgery.  I forgot to mention estrogen cream but I did mention probiotics   1. Recurrent UTI 2.  Nighttime frequency  - Urinalysis, Complete - CULTURE, URINE COMPREHENSIVE   No Follow-up on file.  Reece Packer, MD  [facility] [address], [place], [place] [address] [phone]

## 2017-10-02 LAB — CULTURE, URINE COMPREHENSIVE

## 2017-10-06 ENCOUNTER — Telehealth: Payer: Self-pay | Admitting: Urology

## 2017-10-06 ENCOUNTER — Other Ambulatory Visit: Payer: Self-pay

## 2017-10-06 NOTE — Telephone Encounter (Signed)
Pt called office stating that her mail order insurance will not cover Macrobid on a long term basis as discussed at her last visit with Dr. Matilde Sprang. Pt asks if the other alternative that was discussed at appointment can be called in to her mail order.  Please call patient @ [phone]. Thanks.

## 2017-10-07 DIAGNOSIS — I10 Essential (primary) hypertension: Secondary | ICD-10-CM | POA: Diagnosis not present

## 2017-10-07 DIAGNOSIS — Z01818 Encounter for other preprocedural examination: Secondary | ICD-10-CM | POA: Diagnosis not present

## 2017-10-07 DIAGNOSIS — M4185 Other forms of scoliosis, thoracolumbar region: Secondary | ICD-10-CM | POA: Diagnosis not present

## 2017-10-07 DIAGNOSIS — Z792 Long term (current) use of antibiotics: Secondary | ICD-10-CM | POA: Diagnosis not present

## 2017-10-07 DIAGNOSIS — M5135 Other intervertebral disc degeneration, thoracolumbar region: Secondary | ICD-10-CM | POA: Diagnosis not present

## 2017-10-07 DIAGNOSIS — Z6827 Body mass index (BMI) 27.0-27.9, adult: Secondary | ICD-10-CM | POA: Diagnosis not present

## 2017-10-07 DIAGNOSIS — E785 Hyperlipidemia, unspecified: Secondary | ICD-10-CM | POA: Diagnosis not present

## 2017-10-07 DIAGNOSIS — M8580 Other specified disorders of bone density and structure, unspecified site: Secondary | ICD-10-CM | POA: Diagnosis not present

## 2017-10-07 DIAGNOSIS — E039 Hypothyroidism, unspecified: Secondary | ICD-10-CM | POA: Diagnosis not present

## 2017-10-07 DIAGNOSIS — Z419 Encounter for procedure for purposes other than remedying health state, unspecified: Secondary | ICD-10-CM | POA: Diagnosis not present

## 2017-10-07 DIAGNOSIS — K219 Gastro-esophageal reflux disease without esophagitis: Secondary | ICD-10-CM | POA: Diagnosis not present

## 2017-10-07 DIAGNOSIS — N390 Urinary tract infection, site not specified: Secondary | ICD-10-CM | POA: Diagnosis not present

## 2017-10-07 DIAGNOSIS — M4605 Spinal enthesopathy, thoracolumbar region: Secondary | ICD-10-CM | POA: Diagnosis not present

## 2017-10-07 DIAGNOSIS — M4152 Other secondary scoliosis, cervical region: Secondary | ICD-10-CM | POA: Diagnosis not present

## 2017-10-08 NOTE — Telephone Encounter (Signed)
Spoke with pt in reference to [place] and pharmacy concerns. Pt voiced understanding.

## 2017-10-20 DIAGNOSIS — Z853 Personal history of malignant neoplasm of breast: Secondary | ICD-10-CM | POA: Diagnosis not present

## 2017-10-20 DIAGNOSIS — M4184 Other forms of scoliosis, thoracic region: Secondary | ICD-10-CM | POA: Diagnosis not present

## 2017-10-20 DIAGNOSIS — I10 Essential (primary) hypertension: Secondary | ICD-10-CM | POA: Diagnosis present

## 2017-10-20 DIAGNOSIS — E785 Hyperlipidemia, unspecified: Secondary | ICD-10-CM | POA: Diagnosis present

## 2017-10-20 DIAGNOSIS — M5416 Radiculopathy, lumbar region: Secondary | ICD-10-CM | POA: Diagnosis not present

## 2017-10-20 DIAGNOSIS — Z981 Arthrodesis status: Secondary | ICD-10-CM | POA: Diagnosis not present

## 2017-10-20 DIAGNOSIS — Z79899 Other long term (current) drug therapy: Secondary | ICD-10-CM | POA: Diagnosis not present

## 2017-10-20 DIAGNOSIS — E039 Hypothyroidism, unspecified: Secondary | ICD-10-CM | POA: Diagnosis present

## 2017-10-20 DIAGNOSIS — M545 Low back pain: Secondary | ICD-10-CM | POA: Diagnosis not present

## 2017-10-20 DIAGNOSIS — K219 Gastro-esophageal reflux disease without esophagitis: Secondary | ICD-10-CM | POA: Diagnosis present

## 2017-10-20 DIAGNOSIS — D649 Anemia, unspecified: Secondary | ICD-10-CM | POA: Diagnosis not present

## 2017-10-20 DIAGNOSIS — M419 Scoliosis, unspecified: Secondary | ICD-10-CM | POA: Diagnosis not present

## 2017-10-20 DIAGNOSIS — M4185 Other forms of scoliosis, thoracolumbar region: Secondary | ICD-10-CM | POA: Diagnosis not present

## 2017-10-20 DIAGNOSIS — M8580 Other specified disorders of bone density and structure, unspecified site: Secondary | ICD-10-CM | POA: Diagnosis present

## 2017-10-20 DIAGNOSIS — N390 Urinary tract infection, site not specified: Secondary | ICD-10-CM | POA: Diagnosis present

## 2017-10-20 DIAGNOSIS — R2689 Other abnormalities of gait and mobility: Secondary | ICD-10-CM | POA: Diagnosis not present

## 2017-10-23 HISTORY — PX: SPINE SURGERY: SHX786

## 2017-10-29 ENCOUNTER — Encounter: Payer: Self-pay | Admitting: Emergency Medicine

## 2017-10-29 ENCOUNTER — Emergency Department
Admission: EM | Admit: 2017-10-29 | Discharge: 2017-10-29 | Disposition: A | Discharge status: Home / Self Care | Payer: Medicare Other | Attending: Emergency Medicine | Admitting: Emergency Medicine

## 2017-10-29 ENCOUNTER — Emergency Department: Payer: Medicare Other

## 2017-10-29 DIAGNOSIS — R600 Localized edema: Secondary | ICD-10-CM | POA: Diagnosis not present

## 2017-10-29 DIAGNOSIS — M79605 Pain in left leg: Secondary | ICD-10-CM | POA: Diagnosis present

## 2017-10-29 DIAGNOSIS — M7122 Synovial cyst of popliteal space [Baker], left knee: Secondary | ICD-10-CM | POA: Diagnosis not present

## 2017-10-29 DIAGNOSIS — I10 Essential (primary) hypertension: Secondary | ICD-10-CM | POA: Diagnosis not present

## 2017-10-29 DIAGNOSIS — Z853 Personal history of malignant neoplasm of breast: Secondary | ICD-10-CM | POA: Insufficient documentation

## 2017-10-29 LAB — BASIC METABOLIC PANEL
Anion gap: 9 (ref 5–15)
BUN: 11 mg/dL (ref 6–20)
CO2: 28 mmol/L (ref 22–32)
Calcium: 8.4 mg/dL — ABNORMAL LOW (ref 8.9–10.3)
Chloride: 99 mmol/L — ABNORMAL LOW (ref 101–111)
Creatinine, Ser: 0.48 mg/dL (ref 0.44–1.00)
GFR calc Af Amer: >60 mL/min (ref >60)
GFR calc non Af Amer: >60 mL/min (ref >60)
Glucose, Bld: 124 mg/dL — ABNORMAL HIGH (ref 65–99)
Potassium: 3.7 mmol/L (ref 3.5–5.1)
Sodium: 136 mmol/L (ref 135–145)

## 2017-10-29 LAB — CBC
HCT: 25.8 % — ABNORMAL LOW (ref 35.0–47.0)
Hemoglobin: 8.5 g/dL — ABNORMAL LOW (ref 12.0–16.0)
MCH: 27.7 pg (ref 26.0–34.0)
MCHC: 32.8 g/dL (ref 32.0–36.0)
MCV: 84.5 fL (ref 80.0–100.0)
Platelets: 404 10*3/uL (ref 150–440)
RBC: 3.06 MIL/uL — ABNORMAL LOW (ref 3.80–5.20)
RDW: 14.9 % — ABNORMAL HIGH (ref 11.5–14.5)
WBC: 7.4 10*3/uL (ref 3.6–11.0)

## 2017-10-29 MED ORDER — OXYCODONE-ACETAMINOPHEN 5-325 MG PO TABS
2.0000 | ORAL_TABLET | Freq: Once | ORAL | Status: AC
  Administered 2017-10-29: 2 via ORAL
  Filled 2017-10-29: qty 2

## 2017-10-29 NOTE — ED Triage Notes (Signed)
Patient is having Left leg pain. Typo in initial triage note.

## 2017-10-29 NOTE — ED Notes (Signed)
Spoke with MD Jimmye Norman about pt presentation, verbal orders for blood work and [place]

## 2017-10-29 NOTE — ED Provider Notes (Signed)
[facility] Emergency Department Provider Note       Time seen: ----------------------------------------- 3:53 PM on 10/29/2017 -----------------------------------------   I have reviewed the triage vital signs and the nursing notes.  HISTORY   Chief Complaint Leg Pain    HPI Maria Spencer is a 71 y.o. female with a history of arthritis, breast cancer, GERD, hypertension and recent spinal fusion surgery who presents to the ED for left leg pain and swelling.  Patient had spinal fusion on 26 November and a bone graft from the left hip.  She denies fevers, chills, chest pain, shortness of breath, vomiting or diarrhea.  Pain is 5 out of 10 in the left leg that she describes as an ache.  Past Medical History:  Diagnosis Date  . Arthritis   . Breast cancer ([place])   . GERD (gastroesophageal reflux disease)   . Hormone disorder   . Hyperlipidemia   . Hypertension   . Thyroid disease     There are no active problems to display for this patient.   Past Surgical History:  Procedure Laterality Date  . BREAST LUMPECTOMY    . TOTAL SHOULDER REPLACEMENT      Allergies Sulfa antibiotics  Social History Social History   Tobacco Use  . Smoking status: Never Smoker  . Smokeless tobacco: Never Used  Substance Use Topics  . Alcohol use: Yes  . Drug use: No    Review of Systems Constitutional: Negative for fever. Cardiovascular: Negative for chest pain. Respiratory: Negative for shortness of breath. Gastrointestinal: Negative for abdominal pain, vomiting and diarrhea. Genitourinary: Negative for dysuria. Musculoskeletal: Positive for left leg pain Skin: Negative for rash. Neurological: Negative for headaches, focal weakness or numbness.  All systems negative/normal/unremarkable except as stated in the HPI  ____________________________________________   PHYSICAL EXAM:  VITAL SIGNS: ED Triage Vitals  Enc Vitals Group     BP 10/29/17 1441  132/82     Pulse Rate 10/29/17 1441 99     Resp 10/29/17 1441 16     Temp 10/29/17 1441 97.6 F (36.4 C)     Temp Source 10/29/17 1441 Oral     SpO2 10/29/17 1441 100 %     Weight 10/29/17 1441 141 lb (64 kg)     Height 10/29/17 1441 4\' 11"  (1.499 m)     Head Circumference --      Peak Flow --      Pain Score 10/29/17 1445 5     Pain Loc --      Pain Edu? --      Excl. in [place]? --     Constitutional: Alert and oriented. Well appearing and in no distress. Eyes: Conjunctivae are normal. Normal extraocular movements. ENT   Head: Normocephalic and atraumatic.   Nose: No congestion/rhinnorhea.   Mouth/Throat: Mucous membranes are moist.   Neck: No stridor. Cardiovascular: Normal rate, regular rhythm. No murmurs, rubs, or gallops. Respiratory: Normal respiratory effort without tachypnea nor retractions. Breath sounds are clear and equal bilaterally. No wheezes/rales/rhonchi. Gastrointestinal: Soft and nontender. Normal bowel sounds Musculoskeletal: Left lower extremity edema compared to right.  Good peripheral pulses Neurologic:  Normal speech and language. No gross focal neurologic deficits are appreciated.  Skin:  Skin is warm, dry and surgical incision sites are clean dry and intact. Psychiatric: Mood and affect are normal. Speech and behavior are normal.  ____________________________________________  ED COURSE:  Pertinent labs & imaging results that were available during my care of the patient were reviewed by  me and considered in my medical decision making (see chart for details). Patient presents for left leg pain post surgery, we will assess with labs and imaging as indicated.   Procedures ____________________________________________   LABS (pertinent positives/negatives)  Labs Reviewed  BASIC METABOLIC PANEL - Abnormal; Notable for the following components:      Result Value   Chloride 99 (*)    Glucose, Bld 124 (*)    Calcium 8.4 (*)    All other  components within normal limits  CBC - Abnormal; Notable for the following components:   RBC 3.06 (*)    Hemoglobin 8.5 (*)    HCT 25.8 (*)    RDW 14.9 (*)    All other components within normal limits    RADIOLOGY Images were viewed by me  Left lower extremity ultrasound is negative for DVT but positive for a Baker's cyst  ____________________________________________  DIFFERENTIAL DIAGNOSIS   DVT, Baker's cyst, cellulitis, surgical disruption of the lymphatic system, immobility  FINAL ASSESSMENT AND PLAN  Edema, Baker's cyst   Plan: Patient had presented for left leg pain postoperatively. Patient's labs are at her baseline with a normal white blood cell count. Patient's imaging did reveal a Baker's cyst.  I will advise compression stockings for her and follow-up with her surgeon for recheck.   Earleen Newport, MD   Note: This note was generated in part or whole with voice recognition software. Voice recognition is usually quite accurate but there are transcription errors that can and very often do occur. I apologize for any typographical errors that were not detected and corrected.     Earleen Newport, MD 10/29/17 1556

## 2017-10-29 NOTE — ED Triage Notes (Signed)
PT to ED via POV with c/o RT leg pain and swelling, pt recently had spinal fusion on 11/26 and bone graft on 11/29. Pt denies SOB. VS stable

## 2017-11-10 DIAGNOSIS — Z4802 Encounter for removal of sutures: Secondary | ICD-10-CM | POA: Diagnosis not present

## 2017-11-10 DIAGNOSIS — Z09 Encounter for follow-up examination after completed treatment for conditions other than malignant neoplasm: Secondary | ICD-10-CM | POA: Diagnosis not present

## 2017-11-12 ENCOUNTER — Encounter: Payer: Self-pay | Admitting: Physical Therapy

## 2017-11-12 ENCOUNTER — Ambulatory Visit: Payer: Medicare Other | Attending: Neurosurgery | Admitting: Physical Therapy

## 2017-11-12 DIAGNOSIS — G8929 Other chronic pain: Secondary | ICD-10-CM

## 2017-11-12 DIAGNOSIS — R293 Abnormal posture: Secondary | ICD-10-CM | POA: Diagnosis not present

## 2017-11-12 DIAGNOSIS — M545 Low back pain, unspecified: Secondary | ICD-10-CM

## 2017-11-12 DIAGNOSIS — R262 Difficulty in walking, not elsewhere classified: Secondary | ICD-10-CM | POA: Diagnosis not present

## 2017-11-12 DIAGNOSIS — M6281 Muscle weakness (generalized): Secondary | ICD-10-CM | POA: Diagnosis not present

## 2017-11-12 NOTE — Therapy (Signed)
[place] MAIN [facility] SERVICES [address] [place], [place], [address] Phone: [phone]   Fax:  [phone]  Physical Therapy Evaluation  Patient Details  Name: Maria Spencer MRN: [id number] Date of Birth: [birth year] Referring Provider: Dr. Glade Nurse   Encounter Date: 11/12/2017  PT End of Session - 11/12/17 1516    Visit Number  1    Number of Visits  25    Date for PT Re-Evaluation  02/04/18    Authorization Type  gcode 1    Authorization Time Period  10    PT Start Time  1350    PT Stop Time  1445    PT Time Calculation (min)  55 min    Equipment Utilized During Treatment  Gait belt    Activity Tolerance  Patient limited by pain    Behavior During Therapy  [facility] for tasks assessed/performed       Past Medical History:  Diagnosis Date  . Arthritis    neck, back, left knee;   . Breast cancer ([place]) 2009  . GERD (gastroesophageal reflux disease)   . Hormone disorder   . Hyperlipidemia   . Hypertension    controlled well with medication;   . Thyroid disease     Past Surgical History:  Procedure Laterality Date  . BREAST LUMPECTOMY    . TOTAL SHOULDER REPLACEMENT      There were no vitals filed for this visit.   Subjective Assessment - 11/12/17 1358    Subjective  "My back is very stiff and I can't bend/twist so I have a hard time doing most of my daily tasks."     Pertinent History  71 yo Female s/p scoliosis surgery with rod/screw placement from T10 to illium; Patient reports having increased pain in back since surgery; Her surgery was on 10/20/17 and on 10/23/17 she had a bone graft; Patient went and got stitches removed on 11/10/17; Patient goes back for a follow up on Januray 15; She presents to therapy with walking stick; Patient reports using walking stick most of the time; However at home she can walk to bedroom/bathroom without the walking stick; She reports needing help with some ADLs including difficulty cleaning  self, lower body dressing; denies any recent falls; She reports having some numbness in anterior left hip which the nurse practioner believes is related to bone graft; She reports having numbness in BLE prior to surgery but since surgery she is having numbness on anterior left leg which limits her ability to lift leg; Patient reports approximately 4 sleep disturbances per night related to back pain;     How long can you sit comfortably?  30 min;     How long can you stand comfortably?  10-15 min;     How long can you walk comfortably?  about 300-500 feet;     Diagnostic tests  Did x-rays which looked good after rod placement;     Patient Stated Goals  "To reduce pain and improve strength/mobility"    Currently in Pain?  Yes    Pain Score  4     Pain Location  Back    Pain Orientation  Upper;Mid    Pain Descriptors / Indicators  Throbbing    Pain Type  Acute pain    Pain Radiating Towards  radiates down low back;     Pain Onset  1 to 4 weeks ago    Pain Frequency  Constant    Aggravating Factors  prolonged sitting/standing; worse in the evening;     Pain Relieving Factors  pain medicine helps;     Effect of Pain on Daily Activities  decreased activity;     Multiple Pain Sites  No         [facility] PT Assessment - 11/12/17 0001      Assessment   Medical Diagnosis  s/p back fusion due to Scoliosis    Referring Provider  Dr. Glade Nurse    Onset Date/Surgical Date  10/20/17    Hand Dominance  Right    Next MD Visit  January15, 2019    Prior Therapy  Denies any PT for this condition; has had back injections, and other treatment;       Precautions   Precautions  Back    Precaution Comments  no lifting, twisting no lifting >10#    Required Braces or Orthoses  -- none;       Restrictions   Weight Bearing Restrictions  No      Balance Screen   Has the patient fallen in the past 6 months  No    Has the patient had a decrease in activity level because of a fear of falling?   No    Is the  patient reluctant to leave their home because of a fear of falling?   No      Home Environment   Additional Comments  lives in single story home, no stairs to enter; husband is there; does need help with cleaning, hygiene, lower body dressing etc;       Prior Function   Level of Independence  Independent    Vocation  Retired    Leisure  hike, fish, likes to stay active;       Cognition   Overall Cognitive Status  Within Functional Limits for tasks assessed      Observation/Other Assessments   Observations  in standing: patient exhibits "S" curve in thoracolumbar spine with right hip slightly higher than left; she reports that the curve is significantly better since surgery;     Modified Oswertry  68% (extreme disability)      Sensation   Light Touch  Appears Intact    Stereognosis  Appears Intact    Proprioception  Appears Intact      Coordination   Gross Motor Movements are Fluid and Coordinated  Yes    Fine Motor Movements are Fluid and Coordinated  Yes      Posture/Postural Control   Posture Comments  sits with erect posture; stands with erect posture; does have "S" curve in thoracolumbar spine;       AROM   Overall AROM Comments  BUE are WFL however decreased overhead lift from past RCR; RLE is WFL; LLE decreased hip flexion due to weakness/pain;       Strength   Overall Strength Comments  BUE grossly 4/5, RLE grossly 4/5 with exception of right hip abductors grossly 3-/5, LLE: hip 3-/5, knee 4-/5, ankle 4-/5      Palpation   Spinal mobility  unable to assess; however exhibits increased stiffness from recent fusion;     Palpation comment  severe tenderness to palpation with very light pressure along upper thoracic throughout lumbar spine; moderate tenderness to left gluteals/piriformis;       Transfers   Comments  able to transfer without pushing on chair;       Ambulation/Gait   Gait Comments  ambulates with walking stick with moderate trendelenburg gait on right  side;  slower gait speed;       Standardized Balance Assessment   Five times sit to stand comments   16.67 sec without HHA (>15 sec indicates increased risk for falls)    10 Meter Walk  1.05 m/s with walking stick; community ambulator;       High Level Balance   High Level Balance Comments  SLS: unable to hold each LE; static standing balance is fair, dynamic standing balance is fair;              Objective measurements completed on examination: See above findings.   TREATMENT: Educated patient in safe toilet assistive device to use to help with cleaning and hygiene; Also educated patient in proper use of reacher to help with ADLs around home without bending over;  Instructed patient in sitting LE strengthening exercise: Red tband around BLE: Hip flexion x10 Hip abduction x10 LAQ x10 All bilaterally; Patient required min-moderate verbal/tactile cues for correct exercise technique.            PT Education - 11/12/17 1515    Education provided  Yes    Education Details  HEP initiated, educated on safe ADL equipment; recommendations;     Person(s) Educated  Patient    Methods  Explanation;Demonstration;Verbal cues;Handout    Comprehension  Verbalized understanding;Returned demonstration;Verbal cues required;Need further instruction       PT Short Term Goals - 11/12/17 1521      PT SHORT TERM GOAL #1   Title  Patient will reduce modified Oswestry score to <40% as to demonstrate less disability with ADLs including improved sleeping tolerance, walking/sitting tolerance etc for better mobility with ADLs.     Time  4    Period  Weeks    Status  New    Target Date  12/10/17      PT SHORT TERM GOAL #2   Title  Patient will increase BLE gross hip strength to 4/5 as to improve functional strength for independent gait, increased standing tolerance and increased ADL ability.    Time  4    Period  Weeks    Status  New    Target Date  12/10/17      PT SHORT TERM GOAL #3    Title  Patient (> 23 years old) will complete five times sit to stand test in < 15 seconds indicating an increased LE strength and improved balance.    Time  4    Period  Weeks    Status  New    Target Date  12/10/17        PT Long Term Goals - 11/12/17 1523      PT LONG TERM GOAL #1   Title  Patient will be independent in home exercise program to improve strength/mobility for better functional independence with ADLs.    Time  12    Period  Weeks    Status  New    Target Date  02/04/18      PT LONG TERM GOAL #2   Title  Patient will tolerate 5 seconds of single leg stance without loss of balance to improve ability to get in and out of shower safely.    Time  12    Period  Weeks    Status  New    Target Date  02/04/18      PT LONG TERM GOAL #3   Title  Patient will increase BLE gross strength to 4+/5 as to improve functional strength  for independent gait, increased standing tolerance and increased ADL ability.    Time  12    Period  Weeks    Status  New    Target Date  02/04/18      PT LONG TERM GOAL #4   Title  Patient will report a worst pain of 3/10 on VAS in low back            to improve tolerance with ADLs and reduced symptoms with activities.     Time  12    Period  Weeks    Status  New    Target Date  02/04/18      PT LONG TERM GOAL #5   Title  Patient will reduce modified Oswestry score to <20 as to demonstrate minimal disability with ADLs including improved sleeping tolerance, walking/sitting tolerance etc for better mobility with ADLs.     Time  12    Period  Weeks    Status  New    Target Date  02/04/18             Plan - 11/18/17 1517    Clinical Impression Statement  71 yo Female s/p recent thoraclumbar fusion to treat thoracolumbar scoliosis on 10/20/17 reports increased stiffness and back pain; Patient presents with walking stick which she states she has been using since surgery; She is on back restrictions of no lifting >10#, no twisting/bending;  Patient reports needing help from husband for some ADLs including hygiene, lower body dressing etc; Patient exhibits increased weakness in BLE particularly in hips. She ambulates with right trendelenburg. She tested as slight risk for falls and exhibits fair standing balance: Patient would benefit from skilled PT intervention to improve core strength, LE strength and overall functional mobility while reducing back pain;     History and Personal Factors relevant to plan of care:  lives in single story home with good caregiver support; negative: older age, severity of deficits, OA progressive condition, increased risk for falls;     Clinical Presentation  Evolving    Clinical Presentation due to:  low back pain which radiates into LE hip pain with increased weakness in LE;     Clinical Decision Making  Moderate    Rehab Potential  Good    Clinical Impairments Affecting Rehab Potential  motivated, recent onset of deficits, negative: chronic pain;     PT Frequency  2x / week    PT Duration  12 weeks    PT Treatment/Interventions  Aquatic Therapy;Cryotherapy;Electrical Stimulation;Moist Heat;DME Instruction;Gait training;Stair training;Functional mobility training;Therapeutic activities;Therapeutic exercise;Manual techniques;Patient/family education;Neuromuscular re-education;Balance training;Passive range of motion;Energy conservation    PT Next Visit Plan  advance HEP    PT Home Exercise Plan  initiated- see patient instructions;     Consulted and Agree with Plan of Care  Patient       Patient will benefit from skilled therapeutic intervention in order to improve the following deficits and impairments:  Abnormal gait, Decreased endurance, Hypomobility, Decreased activity tolerance, Decreased knowledge of use of DME, Decreased strength, Pain, Increased fascial restricitons, Increased muscle spasms, Difficulty walking, Decreased mobility, Decreased balance, Decreased range of motion, Improper body  mechanics, Postural dysfunction, Impaired flexibility  Visit Diagnosis: Chronic midline low back pain without sciatica  Abnormal posture  Difficulty in walking, not elsewhere classified  Muscle weakness (generalized)  G-Codes - 11/18/17 1524    Functional Assessment Tool Used (Outpatient Only)  Modified oswestry, 10 meter walk, 5 times sit<>stand, clinical judgement;  Functional Limitation  Mobility: Walking and moving around    Mobility: Walking and Moving Around Current Status [phone])  At least 40 percent but less than 60 percent impaired, limited or restricted    Mobility: Walking and Moving Around Goal Status [phone])  At least 1 percent but less than 20 percent impaired, limited or restricted        Problem List There are no active problems to display for this patient.   Trotter,Margaret PT, DPT 11/12/2017, 3:25 PM  [place] MAIN [facility] SERVICES [address] [place], [place], [address] Phone: [phone]   Fax:  [phone]  Name: Ireland Virrueta MRN: [id number] Date of Birth: 1946/04/25

## 2017-11-12 NOTE — Patient Instructions (Signed)
ABDUCTION: Sitting - Exercise Ball: Resistance Band (Active)   Sit with feet flat. With band tied around both legs, Lift right leg slightly and, against resistance band, draw it out to side. Complete __2_ sets of __10_ repetitions. Perform _2__ sessions per day.  Copyright  VHI. All rights reserved.  FLEXION: Sitting - Resistance Band (Active)   Sit, both feet flat. Have band tied around both legs above knees, lift right knee toward ceiling.Repeat with other knee Complete _2__ sets of _10__ repetitions. Perform _2__ sessions per day.  http://gtsc.exer.us/21   Knee Extension: Resisted (Sitting)   With band looped around right ankle and under other foot, straighten leg with ankle loop. Keep other leg bent to increase resistance. Repeat _10___ times per set. Do __2__ sets per session. Do _2___ sessions per day.  http://orth.exer.us/691   Copyright  VHI. All rights reserved.   Copyright  VHI. All rights reserved.  Toe / Heel Raise (Sitting)   Sitting, raise heels, then rock back on heels and raise toes. Repeat _10___ times.  Copyright  VHI. All rights reserved.  HIP / KNEE: Extension - Sit to Stand   Sitting, lean chest forward, raise hips up from surface. Straighten hips and knees. Weight bear equally on left and right sides. Backs of legs should not push off surface. __5_ reps per set, __2_ sets per day, _5__ days per week Use assistive device as needed.

## 2017-11-26 ENCOUNTER — Encounter: Payer: Self-pay | Admitting: Physical Therapy

## 2017-11-26 ENCOUNTER — Ambulatory Visit: Payer: Medicare Other | Attending: Neurosurgery | Admitting: Physical Therapy

## 2017-11-26 DIAGNOSIS — M545 Low back pain, unspecified: Secondary | ICD-10-CM

## 2017-11-26 DIAGNOSIS — M6281 Muscle weakness (generalized): Secondary | ICD-10-CM | POA: Insufficient documentation

## 2017-11-26 DIAGNOSIS — G8929 Other chronic pain: Secondary | ICD-10-CM

## 2017-11-26 DIAGNOSIS — R262 Difficulty in walking, not elsewhere classified: Secondary | ICD-10-CM | POA: Diagnosis not present

## 2017-11-26 DIAGNOSIS — R293 Abnormal posture: Secondary | ICD-10-CM | POA: Insufficient documentation

## 2017-11-26 NOTE — Therapy (Signed)
[place] MAIN [facility] SERVICES [address] [place], [place], [address] Phone: [phone]   Fax:  [phone]  Physical Therapy Treatment  Patient Details  Name: Maria Spencer MRN: [id number] Date of Birth: June 23, 1946 Referring Provider: Dr. Glade Nurse   Encounter Date: 11/26/2017  PT End of Session - 11/26/17 0909    Visit Number  2    Number of Visits  25    Date for PT Re-Evaluation  02/04/18    Authorization Type  gcode 2    Authorization Time Period  10    PT Start Time  0900    PT Stop Time  0945    PT Time Calculation (min)  45 min    Equipment Utilized During Treatment  Gait belt    Activity Tolerance  Patient limited by pain    Behavior During Therapy  [facility] for tasks assessed/performed       Past Medical History:  Diagnosis Date  . Arthritis    neck, back, left knee;   . Breast cancer ([place]) 2009  . GERD (gastroesophageal reflux disease)   . Hormone disorder   . Hyperlipidemia   . Hypertension    controlled well with medication;   . Thyroid disease     Past Surgical History:  Procedure Laterality Date  . BREAST LUMPECTOMY    . TOTAL SHOULDER REPLACEMENT      There were no vitals filed for this visit.  Subjective Assessment - 11/26/17 0907    Subjective  patient reports increased pain after last session; She reports, "I think that it was just too agressive last time. I was really sore for a few days. I tried to walk outside but I just couldn't do that. I realized that I can't walk on pavement and have to walk on the rubber track. I have been going to [facility] to walk on the rubber track. She reports going a few times a week.     Pertinent History  72 yo Female s/p scoliosis surgery with rod/screw placement from T10 to illium; Patient reports having increased pain in back since surgery; Her surgery was on 10/20/17 and on 10/23/17 she had a bone graft; Patient went and got stitches removed on 11/10/17; Patient goes back for  a follow up on Januray 15; She presents to therapy with walking stick; Patient reports using walking stick most of the time; However at home she can walk to bedroom/bathroom without the walking stick; She reports needing help with some ADLs including difficulty cleaning self, lower body dressing; denies any recent falls; She reports having some numbness in anterior left hip which the nurse practioner believes is related to bone graft; She reports having numbness in BLE prior to surgery but since surgery she is having numbness on anterior left leg which limits her ability to lift leg; Patient reports approximately 4 sleep disturbances per night related to back pain;     How long can you sit comfortably?  30 min;     How long can you stand comfortably?  10-15 min;     How long can you walk comfortably?  about 300-500 feet;     Diagnostic tests  Did x-rays which looked good after rod placement;     Patient Stated Goals  "To reduce pain and improve strength/mobility"    Currently in Pain?  Yes    Pain Score  2     Pain Location  Back    Pain Orientation  Lower  Pain Descriptors / Indicators  Throbbing    Pain Type  Acute pain;Surgical pain    Pain Radiating Towards  radiates down low back and into right hip;     Pain Onset  1 to 4 weeks ago    Pain Frequency  Intermittent    Aggravating Factors   prolonged sitting/standing; worse in the evening;     Pain Relieving Factors  pain medicine helps;     Effect of Pain on Daily Activities  decreased activity;     Multiple Pain Sites  No           TREATMENT: Warm up on Nustep BUE/BLE level 1 x5 min (unbilled) concurrent with moist heat to low back  Sitting: Diaphragmatic breathing with lower pelvic posterior/anterior rotation to tolerance; Required min Vcs for correct technique and positioning x10 reps; Alternate march with lower abdominal stabilization 2x5 reps; Hip adduction ball squeeze 5 sec hold x10 reps with cues for core stabilization;  Patient reports slight pull in right hip but denies any back pain;   Red tband around BLE ankles: LAQ x10 bilaterally with cues to avoid painful ROM and to keep contralateral limb back for better pull; Hamstring curl, red tband BLE x10 reps each LE;  Sitting: Red tband BUE shoulder scapular retraction 2x10 reps, with cues to increase scapular retraction and reduce shoulder extension for less discomfort;  Posterior shoulder rolls x10 reps;  patient in left sidelying; PT performed soft tissue massage to right posterior hip and lateral LE (gluteals/IT band) with rolling stick to reduce tightness and discomfort x8 min; Patient tolerated well with less tightness and less pain at end of session;   Patient ambulated around gym x80 feet unsupported with right hip trendelenburg due to left hip weakness; Patient reports less hip pain and discomfort when walking as compared to start of session;  Advanced HEP- see patient instructions;                    PT Education - 11/26/17 0909    Education provided  Yes    Education Details  HEP reinforced, LE strengthening, stretches;     Person(s) Educated  Patient    Methods  Explanation;Demonstration;Verbal cues    Comprehension  Verbalized understanding;Returned demonstration;Verbal cues required;Need further instruction       PT Short Term Goals - 11/12/17 1521      PT SHORT TERM GOAL #1   Title  Patient will reduce modified Oswestry score to <40% as to demonstrate less disability with ADLs including improved sleeping tolerance, walking/sitting tolerance etc for better mobility with ADLs.     Time  4    Period  Weeks    Status  New    Target Date  12/10/17      PT SHORT TERM GOAL #2   Title  Patient will increase BLE gross hip strength to 4/5 as to improve functional strength for independent gait, increased standing tolerance and increased ADL ability.    Time  4    Period  Weeks    Status  New    Target Date  12/10/17       PT SHORT TERM GOAL #3   Title  Patient (> 78 years old) will complete five times sit to stand test in < 15 seconds indicating an increased LE strength and improved balance.    Time  4    Period  Weeks    Status  New    Target Date  12/10/17  PT Long Term Goals - 11/12/17 1523      PT LONG TERM GOAL #1   Title  Patient will be independent in home exercise program to improve strength/mobility for better functional independence with ADLs.    Time  12    Period  Weeks    Status  New    Target Date  02/04/18      PT LONG TERM GOAL #2   Title  Patient will tolerate 5 seconds of single leg stance without loss of balance to improve ability to get in and out of shower safely.    Time  12    Period  Weeks    Status  New    Target Date  02/04/18      PT LONG TERM GOAL #3   Title  Patient will increase BLE gross strength to 4+/5 as to improve functional strength for independent gait, increased standing tolerance and increased ADL ability.    Time  12    Period  Weeks    Status  New    Target Date  02/04/18      PT LONG TERM GOAL #4   Title  Patient will report a worst pain of 3/10 on VAS in low back            to improve tolerance with ADLs and reduced symptoms with activities.     Time  12    Period  Weeks    Status  New    Target Date  02/04/18      PT LONG TERM GOAL #5   Title  Patient will reduce modified Oswestry score to <20 as to demonstrate minimal disability with ADLs including improved sleeping tolerance, walking/sitting tolerance etc for better mobility with ADLs.     Time  12    Period  Weeks    Status  New    Target Date  02/04/18            Plan - 11/26/17 0956    Clinical Impression Statement  Patient instructed in advanced LE strengthening and core stabilization exercise; Patient required cues for diaphragmatic breathing to improve lower abdominal support and core stabilization; Patient is hesitant to do a lot of exercise due to increased soreness  after last session; Revised HEP to reduce difficulty; patient asked about aquatic therapy and she declined due to risk of UTIs. Patient would benefit from additional skilled PT intervention to improve core stabilization and reduce back pain;     Rehab Potential  Good    Clinical Impairments Affecting Rehab Potential  motivated, recent onset of deficits, negative: chronic pain;     PT Frequency  2x / week    PT Duration  12 weeks    PT Treatment/Interventions  Aquatic Therapy;Cryotherapy;Electrical Stimulation;Moist Heat;DME Instruction;Gait training;Stair training;Functional mobility training;Therapeutic activities;Therapeutic exercise;Manual techniques;Patient/family education;Neuromuscular re-education;Balance training;Passive range of motion;Energy conservation    PT Next Visit Plan  advance HEP    PT Home Exercise Plan  revised/advanced, see patient instructions;     Consulted and Agree with Plan of Care  Patient       Patient will benefit from skilled therapeutic intervention in order to improve the following deficits and impairments:  Abnormal gait, Decreased endurance, Hypomobility, Decreased activity tolerance, Decreased knowledge of use of DME, Decreased strength, Pain, Increased fascial restricitons, Increased muscle spasms, Difficulty walking, Decreased mobility, Decreased balance, Decreased range of motion, Improper body mechanics, Postural dysfunction, Impaired flexibility  Visit Diagnosis: Chronic midline low back pain  without sciatica  Abnormal posture  Difficulty in walking, not elsewhere classified  Muscle weakness (generalized)     Problem List There are no active problems to display for this patient.   Trotter,Margaret PT, DPT 11/26/2017, 10:25 AM  [place] MAIN [facility] SERVICES [address] [place], [place], [address] Phone: [phone]   Fax:  [phone]  Name: Maria Spencer MRN: [id number] Date of Birth:  10-15-1946

## 2017-11-26 NOTE — Patient Instructions (Addendum)
Posture - Sitting    Sitting in good chair with support: Try to take 10 deep breaths, Inhale- try to expand your belly and arch your back (not a big motion) exhale, pull in abdominal muscles and flatten back into chair (like a "C" curve)- again not a big motion;  DO this 2-3 times a day; Work on slowing down breathing to avoid dizziness and focus on relaxing as you take each breath; Copyright  [employer]. All rights reserved.    Still do marches 2 sets of 5 each leg, each day;   Adduction: Hip - Knees Together (Sitting)    Sit with towel roll/small pillow between knees. Push knees together. Hold for __5_ seconds. Rest for __2_ seconds. Repeat 10___ times. Do _2__ times a day.  Copyright  VHI. All rights reserved.

## 2017-12-01 ENCOUNTER — Encounter: Payer: Self-pay | Admitting: Physical Therapy

## 2017-12-01 ENCOUNTER — Ambulatory Visit: Payer: Medicare Other | Admitting: Physical Therapy

## 2017-12-01 DIAGNOSIS — G8929 Other chronic pain: Secondary | ICD-10-CM | POA: Diagnosis not present

## 2017-12-01 DIAGNOSIS — M545 Low back pain, unspecified: Secondary | ICD-10-CM

## 2017-12-01 DIAGNOSIS — R293 Abnormal posture: Secondary | ICD-10-CM | POA: Diagnosis not present

## 2017-12-01 DIAGNOSIS — M6281 Muscle weakness (generalized): Secondary | ICD-10-CM

## 2017-12-01 DIAGNOSIS — R262 Difficulty in walking, not elsewhere classified: Secondary | ICD-10-CM

## 2017-12-01 NOTE — Therapy (Signed)
[place] MAIN [facility] SERVICES [address] [place], [place], [address] Phone: [phone]   Fax:  [phone]  Physical Therapy Treatment  Patient Details  Name: Maria Spencer MRN: [id number] Date of Birth: Dec 12, 1945 Referring Provider: Dr. Glade Nurse   Encounter Date: 12/01/2017  PT End of Session - 12/01/17 1442    Visit Number  3    Number of Visits  25    Date for PT Re-Evaluation  02/04/18    PT Start Time  1430    PT Stop Time  1515    PT Time Calculation (min)  45 min    Equipment Utilized During Treatment  Gait belt    Activity Tolerance  Patient limited by pain    Behavior During Therapy  [facility] for tasks assessed/performed       Past Medical History:  Diagnosis Date  . Arthritis    neck, back, left knee;   . Breast cancer ([place]) 2009  . GERD (gastroesophageal reflux disease)   . Hormone disorder   . Hyperlipidemia   . Hypertension    controlled well with medication;   . Thyroid disease     Past Surgical History:  Procedure Laterality Date  . BREAST LUMPECTOMY    . TOTAL SHOULDER REPLACEMENT      There were no vitals filed for this visit.  Subjective Assessment - 12/01/17 1440    Subjective  Patient reports feeling good after last session. She reports, "I think the rolling stick really helped." She reports walking on the rubber track. she reports compliance with HEP; Reports feeling a little left hip pain today. "I think its because of how I slept."     Pertinent History  72 yo Female s/p scoliosis surgery with rod/screw placement from T10 to illium; Patient reports having increased pain in back since surgery; Her surgery was on 10/20/17 and on 10/23/17 she had a bone graft; Patient went and got stitches removed on 11/10/17; Patient goes back for a follow up on Januray 15; She presents to therapy with walking stick; Patient reports using walking stick most of the time; However at home she can walk to bedroom/bathroom  without the walking stick; She reports needing help with some ADLs including difficulty cleaning self, lower body dressing; denies any recent falls; She reports having some numbness in anterior left hip which the nurse practioner believes is related to bone graft; She reports having numbness in BLE prior to surgery but since surgery she is having numbness on anterior left leg which limits her ability to lift leg; Patient reports approximately 4 sleep disturbances per night related to back pain;     How long can you sit comfortably?  30 min;     How long can you stand comfortably?  10-15 min;     How long can you walk comfortably?  about 300-500 feet;     Diagnostic tests  Did x-rays which looked good after rod placement;     Patient Stated Goals  "To reduce pain and improve strength/mobility"    Currently in Pain?  Yes    Pain Score  4     Pain Location  Back    Pain Orientation  Left;Lower    Pain Descriptors / Indicators  Aching;Sore;Throbbing    Pain Type  Acute pain;Surgical pain    Pain Onset  1 to 4 weeks ago    Pain Frequency  Intermittent    Aggravating Factors   prolonged sitting/standing; worse in the  evening;     Pain Relieving Factors  pain medicine helps    Effect of Pain on Daily Activities  decreased activity;     Multiple Pain Sites  No          TREATMENT: Warm up on Nustep BUE/BLE level 1 x7 min (unbilled) concurrent with moist heat to low back  Sitting: Diaphragmatic breathing with lower pelvic posterior/anterior rotation to tolerance; Required min Vcs for correct technique and positioning x10 reps; Alternate march with lower abdominal stabilization x10 reps; with cues to increase diaphragmatic breathing for better hip flexion ROM with core activation;  Hip adduction ball squeeze 5 sec hold x15 reps with cues for core stabilization; Patient reports slight pull in right hip but denies any back pain;   Red tband around BLE ankles: Hamstring curl, red tband BLE x12  reps each LE;  Standing: Yellow tband around BLE: Hip abduction x5 reps bilaterally with min A and mod VCs to improve hip positioning for better hip strengthening and stabilization; Patient had significant difficulty lifting LLE due to weakness; Hip extension x5 reps bilaterally with minimal difficulty;  patient in left/right sidelying; PT performed soft tissue massage to right/left posterior hip and lateral LE (gluteals/IT band) with rolling stick to reduce tightness and discomfort x10 min total; Patient tolerated well with less tightness and less pain at end of session;   Patient ambulated around gym x80 feet unsupported with right hip trendelenburg due to left hip weakness; Patient reports less hip pain and discomfort when walking as compared to start of session;                     PT Education - 12/01/17 1442    Education provided  Yes    Education Details  HEP reinforced, LE strengtheing, stretches;     Person(s) Educated  Patient    Methods  Explanation;Demonstration;Verbal cues    Comprehension  Verbalized understanding;Returned demonstration;Verbal cues required;Need further instruction       PT Short Term Goals - 11/12/17 1521      PT SHORT TERM GOAL #1   Title  Patient will reduce modified Oswestry score to <40% as to demonstrate less disability with ADLs including improved sleeping tolerance, walking/sitting tolerance etc for better mobility with ADLs.     Time  4    Period  Weeks    Status  New    Target Date  12/10/17      PT SHORT TERM GOAL #2   Title  Patient will increase BLE gross hip strength to 4/5 as to improve functional strength for independent gait, increased standing tolerance and increased ADL ability.    Time  4    Period  Weeks    Status  New    Target Date  12/10/17      PT SHORT TERM GOAL #3   Title  Patient (> 34 years old) will complete five times sit to stand test in < 15 seconds indicating an increased LE strength and  improved balance.    Time  4    Period  Weeks    Status  New    Target Date  12/10/17        PT Long Term Goals - 11/12/17 1523      PT LONG TERM GOAL #1   Title  Patient will be independent in home exercise program to improve strength/mobility for better functional independence with ADLs.    Time  12    Period  Weeks    Status  New    Target Date  02/04/18      PT LONG TERM GOAL #2   Title  Patient will tolerate 5 seconds of single leg stance without loss of balance to improve ability to get in and out of shower safely.    Time  12    Period  Weeks    Status  New    Target Date  02/04/18      PT LONG TERM GOAL #3   Title  Patient will increase BLE gross strength to 4+/5 as to improve functional strength for independent gait, increased standing tolerance and increased ADL ability.    Time  12    Period  Weeks    Status  New    Target Date  02/04/18      PT LONG TERM GOAL #4   Title  Patient will report a worst pain of 3/10 on VAS in low back            to improve tolerance with ADLs and reduced symptoms with activities.     Time  12    Period  Weeks    Status  New    Target Date  02/04/18      PT LONG TERM GOAL #5   Title  Patient will reduce modified Oswestry score to <20 as to demonstrate minimal disability with ADLs including improved sleeping tolerance, walking/sitting tolerance etc for better mobility with ADLs.     Time  12    Period  Weeks    Status  New    Target Date  02/04/18            Plan - 12/01/17 1640    Clinical Impression Statement  Patient is progressing well. Instructed in her in advanced LE strengthening exercise, progressing to standing exercise. patient had significant difficulty with standing hip abduction due to severe weakness. However she reports less pain at end of session following soft tissue massage with rolling stick. She would benefit from additional skilled PT intervention to improve hip strength and reduce back pain;     Rehab  Potential  Good    Clinical Impairments Affecting Rehab Potential  motivated, recent onset of deficits, negative: chronic pain;     PT Frequency  2x / week    PT Duration  12 weeks    PT Treatment/Interventions  Aquatic Therapy;Cryotherapy;Electrical Stimulation;Moist Heat;DME Instruction;Gait training;Stair training;Functional mobility training;Therapeutic activities;Therapeutic exercise;Manual techniques;Patient/family education;Neuromuscular re-education;Balance training;Passive range of motion;Energy conservation    PT Next Visit Plan  advance HEP    PT Home Exercise Plan  continue as given;     Consulted and Agree with Plan of Care  Patient       Patient will benefit from skilled therapeutic intervention in order to improve the following deficits and impairments:  Abnormal gait, Decreased endurance, Hypomobility, Decreased activity tolerance, Decreased knowledge of use of DME, Decreased strength, Pain, Increased fascial restricitons, Increased muscle spasms, Difficulty walking, Decreased mobility, Decreased balance, Decreased range of motion, Improper body mechanics, Postural dysfunction, Impaired flexibility  Visit Diagnosis: Chronic midline low back pain without sciatica  Abnormal posture  Difficulty in walking, not elsewhere classified  Muscle weakness (generalized)     Problem List There are no active problems to display for this patient.   Trotter,Margaret PT, DPT 12/01/2017, 4:42 PM  [place] MAIN [facility] SERVICES [address] [place], [place], [address] Phone: [phone]   Fax:  [phone]  Name: Maria Spencer MRN: [id number] Date of Birth: 1946-05-11

## 2017-12-04 ENCOUNTER — Ambulatory Visit: Payer: Medicare Other | Admitting: Physical Therapy

## 2017-12-04 ENCOUNTER — Encounter: Payer: Self-pay | Admitting: Physical Therapy

## 2017-12-04 DIAGNOSIS — R262 Difficulty in walking, not elsewhere classified: Secondary | ICD-10-CM | POA: Diagnosis not present

## 2017-12-04 DIAGNOSIS — M6281 Muscle weakness (generalized): Secondary | ICD-10-CM | POA: Diagnosis not present

## 2017-12-04 DIAGNOSIS — G8929 Other chronic pain: Secondary | ICD-10-CM | POA: Diagnosis not present

## 2017-12-04 DIAGNOSIS — R293 Abnormal posture: Secondary | ICD-10-CM | POA: Diagnosis not present

## 2017-12-04 DIAGNOSIS — M545 Low back pain, unspecified: Secondary | ICD-10-CM

## 2017-12-04 NOTE — Therapy (Signed)
[place] MAIN [facility] SERVICES [address] [place], [place], [address] Phone: [phone]   Fax:  [phone]  Physical Therapy Treatment  Patient Details  Name: Maria Spencer MRN: [id number] Date of Birth: 01-20-1946 Referring Provider: Dr. Glade Nurse   Encounter Date: 12/04/2017  PT End of Session - 12/04/17 1437    Visit Number  4    Number of Visits  25    Date for PT Re-Evaluation  02/04/18    PT Start Time  1432    PT Stop Time  1515    PT Time Calculation (min)  43 min    Equipment Utilized During Treatment  Gait belt    Activity Tolerance  Patient limited by pain    Behavior During Therapy  [facility] for tasks assessed/performed       Past Medical History:  Diagnosis Date  . Arthritis    neck, back, left knee;   . Breast cancer ([place]) 2009  . GERD (gastroesophageal reflux disease)   . Hormone disorder   . Hyperlipidemia   . Hypertension    controlled well with medication;   . Thyroid disease     Past Surgical History:  Procedure Laterality Date  . BREAST LUMPECTOMY    . TOTAL SHOULDER REPLACEMENT      There were no vitals filed for this visit.  Subjective Assessment - 12/04/17 1436    Subjective  Patient reports feeling increased soreness in right posterior hip, but states, "Its not too bad." She reports doing well with HEP and has been walking better; She has been walking without the cane;     Pertinent History  72 yo Female s/p scoliosis surgery with rod/screw placement from T10 to illium; Patient reports having increased pain in back since surgery; Her surgery was on 10/20/17 and on 10/23/17 she had a bone graft; Patient went and got stitches removed on 11/10/17; Patient goes back for a follow up on Januray 15; She presents to therapy with walking stick; Patient reports using walking stick most of the time; However at home she can walk to bedroom/bathroom without the walking stick; She reports needing help with some ADLs  including difficulty cleaning self, lower body dressing; denies any recent falls; She reports having some numbness in anterior left hip which the nurse practioner believes is related to bone graft; She reports having numbness in BLE prior to surgery but since surgery she is having numbness on anterior left leg which limits her ability to lift leg; Patient reports approximately 4 sleep disturbances per night related to back pain;     How long can you sit comfortably?  30 min;     How long can you stand comfortably?  10-15 min;     How long can you walk comfortably?  about 300-500 feet;     Diagnostic tests  Did x-rays which looked good after rod placement;     Patient Stated Goals  "To reduce pain and improve strength/mobility"    Currently in Pain?  Yes    Pain Score  3     Pain Location  Hip    Pain Orientation  Right;Lower;Posterior    Pain Descriptors / Indicators  Aching;Sore    Pain Type  Acute pain;Surgical pain    Pain Onset  1 to 4 weeks ago    Pain Frequency  Intermittent    Aggravating Factors   worse with prolonged sitting/standing;     Pain Relieving Factors  pain medicine helps  Effect of Pain on Daily Activities  decreased activity;     Multiple Pain Sites  No        TREATMENT: Warm up on Nustep BUE/BLE level 2x5 min (unbilled) concurrent with moist heat to low back  Sitting with small towel roll behind back: Diaphragmatic breathing with lower pelvic posterior/anterior rotation to tolerance; Required min Vcs for correct technique and positioning x10 reps; Alternate march with lower abdominal stabilization tband x10 reps; with cues to increase diaphragmatic breathing for better hip flexion ROM with core activation;  Hip adduction ball squeeze 5 sec hold x15 reps with cues for core stabilization; Patient reports slight pull in right hip but denies any back pain; Hip abduction red tband 2x5 reps BLE with cues to avoid excessive ROM for better tolerance;    Red tband  around BLE ankles: Hamstring curl, red tband BLE x12 reps each LE;  Standing: Yellow tband around BLE: Hip abduction 2x5 reps bilaterally with min A and mod VCs to improve hip positioning for better hip strengthening and stabilization; Patient had significant difficulty lifting LLE due to weakness; Hip extension 2x5 reps bilaterally with minimal difficulty;  patient in left/right sidelying; PT performed soft tissue massage to right/left posterior hip and lateral LE (gluteals/IT band) with rolling stick to reduce tightness and discomfort x10 min total; Patient tolerated well with less tightness and less pain at end of session;   Sidelying: Clamshells AROM x10 reps bilaterally with min Vcs for positioning to improve hip strengthening;   Patient ambulated around gym x80 feet unsupported with right hip trendelenburg due to left hip weakness; Patient reports less hip pain and discomfort when walking as compared to start of session;                       PT Education - 12/04/17 1437    Education provided  Yes    Education Details  HEP reinforced, LE strengthening, stretches;     Person(s) Educated  Patient    Methods  Explanation;Demonstration;Verbal cues    Comprehension  Verbalized understanding;Returned demonstration;Verbal cues required;Need further instruction       PT Short Term Goals - 11/12/17 1521      PT SHORT TERM GOAL #1   Title  Patient will reduce modified Oswestry score to <40% as to demonstrate less disability with ADLs including improved sleeping tolerance, walking/sitting tolerance etc for better mobility with ADLs.     Time  4    Period  Weeks    Status  New    Target Date  12/10/17      PT SHORT TERM GOAL #2   Title  Patient will increase BLE gross hip strength to 4/5 as to improve functional strength for independent gait, increased standing tolerance and increased ADL ability.    Time  4    Period  Weeks    Status  New    Target Date   12/10/17      PT SHORT TERM GOAL #3   Title  Patient (> 39 years old) will complete five times sit to stand test in < 15 seconds indicating an increased LE strength and improved balance.    Time  4    Period  Weeks    Status  New    Target Date  12/10/17        PT Long Term Goals - 11/12/17 1523      PT LONG TERM GOAL #1   Title  Patient will  be independent in home exercise program to improve strength/mobility for better functional independence with ADLs.    Time  12    Period  Weeks    Status  New    Target Date  02/04/18      PT LONG TERM GOAL #2   Title  Patient will tolerate 5 seconds of single leg stance without loss of balance to improve ability to get in and out of shower safely.    Time  12    Period  Weeks    Status  New    Target Date  02/04/18      PT LONG TERM GOAL #3   Title  Patient will increase BLE gross strength to 4+/5 as to improve functional strength for independent gait, increased standing tolerance and increased ADL ability.    Time  12    Period  Weeks    Status  New    Target Date  02/04/18      PT LONG TERM GOAL #4   Title  Patient will report a worst pain of 3/10 on VAS in low back            to improve tolerance with ADLs and reduced symptoms with activities.     Time  12    Period  Weeks    Status  New    Target Date  02/04/18      PT LONG TERM GOAL #5   Title  Patient will reduce modified Oswestry score to <20 as to demonstrate minimal disability with ADLs including improved sleeping tolerance, walking/sitting tolerance etc for better mobility with ADLs.     Time  12    Period  Weeks    Status  New    Target Date  02/04/18            Plan - 12/04/17 1445    Clinical Impression Statement  Advanced LE strengthening with increased resistance/repetition of LE exercise. Patient requires cues for core stabilization during LE movement for better back support; Patient is progressing well; She is now walking without SPC but still has  trendelenburg gait due to hip weakness; Patient responds well to soft tissue massage with less hip/back pain after exercise. She would benefit from additional skilled PT intervention to improve strength, core stabilization and reduce back pain with ADLs;     Rehab Potential  Good    Clinical Impairments Affecting Rehab Potential  motivated, recent onset of deficits, negative: chronic pain;     PT Frequency  2x / week    PT Duration  12 weeks    PT Treatment/Interventions  Aquatic Therapy;Cryotherapy;Electrical Stimulation;Moist Heat;DME Instruction;Gait training;Stair training;Functional mobility training;Therapeutic activities;Therapeutic exercise;Manual techniques;Patient/family education;Neuromuscular re-education;Balance training;Passive range of motion;Energy conservation    PT Next Visit Plan  advance HEP    PT Home Exercise Plan  continue as given;     Consulted and Agree with Plan of Care  Patient       Patient will benefit from skilled therapeutic intervention in order to improve the following deficits and impairments:  Abnormal gait, Decreased endurance, Hypomobility, Decreased activity tolerance, Decreased knowledge of use of DME, Decreased strength, Pain, Increased fascial restricitons, Increased muscle spasms, Difficulty walking, Decreased mobility, Decreased balance, Decreased range of motion, Improper body mechanics, Postural dysfunction, Impaired flexibility  Visit Diagnosis: Chronic midline low back pain without sciatica  Abnormal posture  Difficulty in walking, not elsewhere classified  Muscle weakness (generalized)     Problem List There are no  active problems to display for this patient.   Trotter,Margaret PT, DPT 12/04/2017, 3:11 PM  [place] MAIN [facility] SERVICES [address] [place], [place], [address] Phone: [phone]   Fax:  [phone]  Name: Emry Tobin MRN: [id number] Date of Birth: [birth year]

## 2017-12-08 ENCOUNTER — Ambulatory Visit: Payer: Medicare Other

## 2017-12-09 ENCOUNTER — Encounter: Payer: Medicare Other | Admitting: Physical Therapy

## 2017-12-09 DIAGNOSIS — Z79899 Other long term (current) drug therapy: Secondary | ICD-10-CM | POA: Diagnosis not present

## 2017-12-09 DIAGNOSIS — Z981 Arthrodesis status: Secondary | ICD-10-CM | POA: Diagnosis not present

## 2017-12-09 DIAGNOSIS — M4185 Other forms of scoliosis, thoracolumbar region: Secondary | ICD-10-CM | POA: Diagnosis not present

## 2017-12-09 DIAGNOSIS — Z882 Allergy status to sulfonamides status: Secondary | ICD-10-CM | POA: Diagnosis not present

## 2017-12-09 DIAGNOSIS — M4317 Spondylolisthesis, lumbosacral region: Secondary | ICD-10-CM | POA: Diagnosis not present

## 2017-12-09 DIAGNOSIS — M50322 Other cervical disc degeneration at C5-C6 level: Secondary | ICD-10-CM | POA: Diagnosis not present

## 2017-12-09 DIAGNOSIS — M25551 Pain in right hip: Secondary | ICD-10-CM | POA: Diagnosis not present

## 2017-12-11 ENCOUNTER — Ambulatory Visit: Payer: Medicare Other | Admitting: Physical Therapy

## 2017-12-11 ENCOUNTER — Encounter: Payer: Self-pay | Admitting: Physical Therapy

## 2017-12-11 DIAGNOSIS — R293 Abnormal posture: Secondary | ICD-10-CM | POA: Diagnosis not present

## 2017-12-11 DIAGNOSIS — R262 Difficulty in walking, not elsewhere classified: Secondary | ICD-10-CM

## 2017-12-11 DIAGNOSIS — G8929 Other chronic pain: Secondary | ICD-10-CM

## 2017-12-11 DIAGNOSIS — M6281 Muscle weakness (generalized): Secondary | ICD-10-CM

## 2017-12-11 DIAGNOSIS — M545 Low back pain, unspecified: Secondary | ICD-10-CM

## 2017-12-11 NOTE — Therapy (Signed)
[place] MAIN [facility] SERVICES [address] [place], [place], [address] Phone: [phone]   Fax:  [phone]  Physical Therapy Treatment  Patient Details  Name: Maria Spencer MRN: [id number] Date of Birth: 07-05-1946 Referring Provider: Dr. Glade Nurse   Encounter Date: 12/11/2017  PT End of Session - 12/11/17 1522    Visit Number  5    Number of Visits  25    Date for PT Re-Evaluation  02/04/18    PT Start Time  4431    PT Stop Time  1600    PT Time Calculation (min)  45 min    Equipment Utilized During Treatment  Gait belt    Activity Tolerance  Patient limited by pain    Behavior During Therapy  [facility] for tasks assessed/performed       Past Medical History:  Diagnosis Date  . Arthritis    neck, back, left knee;   . Breast cancer ([place]) 2009  . GERD (gastroesophageal reflux disease)   . Hormone disorder   . Hyperlipidemia   . Hypertension    controlled well with medication;   . Thyroid disease     Past Surgical History:  Procedure Laterality Date  . BREAST LUMPECTOMY    . TOTAL SHOULDER REPLACEMENT      There were no vitals filed for this visit.  Subjective Assessment - 12/11/17 1521    Subjective  Patient reports doing okay today; She reports some soreness with the cooler weather. Patient to MD who said that everything looks good, the spine is stable. He did recommend that she continue working on hip strengthening;     Pertinent History  72 yo Female s/p scoliosis surgery with rod/screw placement from T10 to illium; Patient reports having increased pain in back since surgery; Her surgery was on 10/20/17 and on 10/23/17 she had a bone graft; Patient went and got stitches removed on 11/10/17; Patient goes back for a follow up on Januray 15; She presents to therapy with walking stick; Patient reports using walking stick most of the time; However at home she can walk to bedroom/bathroom without the walking stick; She reports needing  help with some ADLs including difficulty cleaning self, lower body dressing; denies any recent falls; She reports having some numbness in anterior left hip which the nurse practioner believes is related to bone graft; She reports having numbness in BLE prior to surgery but since surgery she is having numbness on anterior left leg which limits her ability to lift leg; Patient reports approximately 4 sleep disturbances per night related to back pain;     How long can you sit comfortably?  30 min;     How long can you stand comfortably?  10-15 min;     How long can you walk comfortably?  about 300-500 feet;     Diagnostic tests  Did x-rays which looked good after rod placement;     Patient Stated Goals  "To reduce pain and improve strength/mobility"    Currently in Pain?  Yes    Pain Score  3     Pain Location  Back    Pain Orientation  Lower    Pain Descriptors / Indicators  Aching;Sore    Pain Type  Acute pain;Surgical pain    Pain Onset  1 to 4 weeks ago    Pain Frequency  Intermittent    Aggravating Factors   worse with prolonged standing/walking; housework;     Pain Relieving Factors  pain medicine helps    Effect of Pain on Daily Activities  decreased activity;     Multiple Pain Sites  No           TREATMENT: Warm up on Nustep BUE/BLE level 2x34min (unbilled) concurrent with moist heat to low back  Sitting with small towel roll behind back: Diaphragmatic breathing with lower pelvic posterior/anterior rotation to tolerance; Required min Vcs for correct technique and positioning x5 reps; Alternate march with lower abdominal stabilization tband 2x10reps;with cues to increase diaphragmatic breathing for better hip flexion ROM with core activation; Hip adduction ball squeeze 5 sec hold x15reps with cues for core stabilization; Patient reports slight pull in right hip but denies any back pain; Hip abduction green tband 2x10 reps BLE with cues to avoid excessive ROM for better  tolerance;   Standing: Yellow tband around BLE: Hip abduction x10 reps bilaterally with min A and mod VCs to improve hip positioning for better hip strengthening and stabilization; Patient had significant difficulty lifting LLE due to weakness; Hip extension x10 reps bilaterally with minimal difficulty;  patient in left/rightsidelying; PT performed soft tissue massage to right/leftposterior hip and lateral LE (gluteals/IT band) with rolling stick to reduce tightness and discomfort x36min total; Patient tolerated well with less tightness and less pain at end of session;   Sidelying: Clamshells yellow tband AROM 2x5 reps bilaterally with min Vcs for positioning to improve hip strengthening;   Patient ambulated around gym x80 feet unsupported with right hip trendelenburg due to hip weakness; Patient reports less hip pain and discomfort when walking as compared to start of session;                    PT Education - 12/11/17 1522    Education provided  Yes    Education Details  HEP reinforced, LE strengthening, stretches;     Person(s) Educated  Patient    Methods  Explanation;Demonstration;Verbal cues    Comprehension  Verbalized understanding;Returned demonstration;Verbal cues required;Need further instruction       PT Short Term Goals - 11/12/17 1521      PT SHORT TERM GOAL #1   Title  Patient will reduce modified Oswestry score to <40% as to demonstrate less disability with ADLs including improved sleeping tolerance, walking/sitting tolerance etc for better mobility with ADLs.     Time  4    Period  Weeks    Status  New    Target Date  12/10/17      PT SHORT TERM GOAL #2   Title  Patient will increase BLE gross hip strength to 4/5 as to improve functional strength for independent gait, increased standing tolerance and increased ADL ability.    Time  4    Period  Weeks    Status  New    Target Date  12/10/17      PT SHORT TERM GOAL #3   Title  Patient  (> 48 years old) will complete five times sit to stand test in < 15 seconds indicating an increased LE strength and improved balance.    Time  4    Period  Weeks    Status  New    Target Date  12/10/17        PT Long Term Goals - 11/12/17 1523      PT LONG TERM GOAL #1   Title  Patient will be independent in home exercise program to improve strength/mobility for better functional independence with ADLs.  Time  12    Period  Weeks    Status  New    Target Date  02/04/18      PT LONG TERM GOAL #2   Title  Patient will tolerate 5 seconds of single leg stance without loss of balance to improve ability to get in and out of shower safely.    Time  12    Period  Weeks    Status  New    Target Date  02/04/18      PT LONG TERM GOAL #3   Title  Patient will increase BLE gross strength to 4+/5 as to improve functional strength for independent gait, increased standing tolerance and increased ADL ability.    Time  12    Period  Weeks    Status  New    Target Date  02/04/18      PT LONG TERM GOAL #4   Title  Patient will report a worst pain of 3/10 on VAS in low back            to improve tolerance with ADLs and reduced symptoms with activities.     Time  12    Period  Weeks    Status  New    Target Date  02/04/18      PT LONG TERM GOAL #5   Title  Patient will reduce modified Oswestry score to <20 as to demonstrate minimal disability with ADLs including improved sleeping tolerance, walking/sitting tolerance etc for better mobility with ADLs.     Time  12    Period  Weeks    Status  New    Target Date  02/04/18            Plan - 12/11/17 1531    Clinical Impression Statement  Patient instructed in advanced LE strengthening with increased resistance/repetition; patient tolerated well; She was able to exhibit better hip abduction activation in standing; She does continue to have some discomfort with advanced exercise but reports less pain upon sitting; Patient reports no  increase in pain at end of session; She would benefit from additional skilled PT intervention to improve strength, balance and gait safety while reducing back pain;     Rehab Potential  Good    Clinical Impairments Affecting Rehab Potential  motivated, recent onset of deficits, negative: chronic pain;     PT Frequency  2x / week    PT Duration  12 weeks    PT Treatment/Interventions  Aquatic Therapy;Cryotherapy;Electrical Stimulation;Moist Heat;DME Instruction;Gait training;Stair training;Functional mobility training;Therapeutic activities;Therapeutic exercise;Manual techniques;Patient/family education;Neuromuscular re-education;Balance training;Passive range of motion;Energy conservation    PT Next Visit Plan  advance HEP    PT Home Exercise Plan  continue as given;     Consulted and Agree with Plan of Care  Patient       Patient will benefit from skilled therapeutic intervention in order to improve the following deficits and impairments:  Abnormal gait, Decreased endurance, Hypomobility, Decreased activity tolerance, Decreased knowledge of use of DME, Decreased strength, Pain, Increased fascial restricitons, Increased muscle spasms, Difficulty walking, Decreased mobility, Decreased balance, Decreased range of motion, Improper body mechanics, Postural dysfunction, Impaired flexibility  Visit Diagnosis: Chronic midline low back pain without sciatica  Abnormal posture  Difficulty in walking, not elsewhere classified  Muscle weakness (generalized)     Problem List There are no active problems to display for this patient.   Trotter,Margaret PT, DPT 12/11/2017, 4:31 PM  [place] MAIN  [place], [place], [address] Phone: [phone]   Fax:  [phone]  Name: Zakyla Tonche MRN: [id number] Date of Birth: 1946-10-23

## 2017-12-11 NOTE — Patient Instructions (Addendum)
Abduction: Clam (Eccentric) - Side-Lying    Lie on side with knees bent. Lift top knee, keeping feet together. Keep trunk steady. Slowly lower . __10_ reps per set, _1__ sets per day, __5_ days per week. Add ___ lbs when you achieve ___ repetitions.  http://ecce.exer.us/65   Copyright  VHI. All rights reserved.   Balance, Proprioception: Hip Abduction With Tubing   With yellow tubing attached to both ankles, Standing holding onto counter, kick one leg out to side and then Return.  Repeat _10___ times  On each side.  Do ___2_ sessions per day.  http://cc.exer.us/20    Balance, Proprioception: Hip Extension With Tubing   With yellow tubing tied around both legs, holding onto kitchen counter, swing leg back. Return. Repeat _10___ times . Do __2__ sessions per day.  http://cc.exer.us/19    USE GREEN BAND FOR SITTING EXERCISE - DO 1 set of 10-15

## 2017-12-15 ENCOUNTER — Ambulatory Visit: Payer: Medicare Other | Admitting: Physical Therapy

## 2017-12-15 ENCOUNTER — Encounter: Payer: Self-pay | Admitting: Physical Therapy

## 2017-12-15 DIAGNOSIS — M545 Low back pain, unspecified: Secondary | ICD-10-CM

## 2017-12-15 DIAGNOSIS — M6281 Muscle weakness (generalized): Secondary | ICD-10-CM

## 2017-12-15 DIAGNOSIS — R293 Abnormal posture: Secondary | ICD-10-CM | POA: Diagnosis not present

## 2017-12-15 DIAGNOSIS — G8929 Other chronic pain: Secondary | ICD-10-CM

## 2017-12-15 DIAGNOSIS — R262 Difficulty in walking, not elsewhere classified: Secondary | ICD-10-CM

## 2017-12-15 NOTE — Therapy (Signed)
[place] MAIN [facility] SERVICES [address] [place], [place], [address] Phone: [phone]   Fax:  [phone]  Physical Therapy Treatment  Patient Details  Name: Maria Spencer MRN: [id number] Date of Birth: 16-Oct-1946 Referring Provider: Dr. Glade Nurse   Encounter Date: 12/15/2017  PT End of Session - 12/15/17 1122    Visit Number  6    Number of Visits  25    Date for PT Re-Evaluation  02/04/18    PT Start Time  1115    PT Stop Time  1200    PT Time Calculation (min)  45 min    Equipment Utilized During Treatment  Gait belt    Activity Tolerance  Patient limited by pain    Behavior During Therapy  [facility] for tasks assessed/performed       Past Medical History:  Diagnosis Date  . Arthritis    neck, back, left knee;   . Breast cancer ([place]) 2009  . GERD (gastroesophageal reflux disease)   . Hormone disorder   . Hyperlipidemia   . Hypertension    controlled well with medication;   . Thyroid disease     Past Surgical History:  Procedure Laterality Date  . BREAST LUMPECTOMY    . TOTAL SHOULDER REPLACEMENT      There were no vitals filed for this visit.  Subjective Assessment - 12/15/17 1119    Subjective  Patient reports doing well; she states, "I am having a little soreness in my low back. I have noticed more sciatica and that is a little frustrating."     Pertinent History  72 yo Female s/p scoliosis surgery with rod/screw placement from T10 to illium; Patient reports having increased pain in back since surgery; Her surgery was on 10/20/17 and on 10/23/17 she had a bone graft; Patient went and got stitches removed on 11/10/17; Patient goes back for a follow up on Januray 15; She presents to therapy with walking stick; Patient reports using walking stick most of the time; However at home she can walk to bedroom/bathroom without the walking stick; She reports needing help with some ADLs including difficulty cleaning self, lower body  dressing; denies any recent falls; She reports having some numbness in anterior left hip which the nurse practioner believes is related to bone graft; She reports having numbness in BLE prior to surgery but since surgery she is having numbness on anterior left leg which limits her ability to lift leg; Patient reports approximately 4 sleep disturbances per night related to back pain;     How long can you sit comfortably?  30 min;     How long can you stand comfortably?  10-15 min;     How long can you walk comfortably?  about 300-500 feet;     Diagnostic tests  Did x-rays which looked good after rod placement;     Patient Stated Goals  "To reduce pain and improve strength/mobility"    Currently in Pain?  Yes    Pain Score  2     Pain Location  Hip    Pain Orientation  Left;Lower    Pain Descriptors / Indicators  Aching;Sore    Pain Type  Acute pain;Surgical pain    Pain Onset  1 to 4 weeks ago    Pain Frequency  Intermittent    Aggravating Factors   worse with prolonged satnding/walking; housework;     Pain Relieving Factors  pain medicine helps    Effect of Pain  on Daily Activities  decreased activity;     Multiple Pain Sites  No          TREATMENT: Warm up on Nustep BUE/BLE level2x10min (unbilled) concurrent with moist heat to low back  Sittingwith small towel roll behind back: Diaphragmatic breathing with lower pelvic posterior/anterior rotation to tolerance; Required min Vcs for correct technique and positioning x10 reps; Alternate UE lift 1# x10 bilaterally with cues for core stabilization and to avoid painful ROM for better lumbar support;  Alternate march with lower abdominalstabilization tband x12reps;with cues to increase diaphragmatic breathing for better hip flexion ROM with core activation; Hip adduction ball squeeze 5 sec hold x15reps with cues for core stabilization; Patient reports slight pull in right hip but denies any back pain; Hip abduction green tband x12  reps BLE with cues to avoid excessive ROM for better tolerance;  Standing: Yellow tband around BLE: Hip abductionx10 reps bilaterally with min A and mod VCs to improve hip positioning for better hip strengthening and stabilization; Patient had significant difficulty lifting LLE due to weakness; Hip extensionx10 reps bilaterally with minimal difficulty; Hip flexion SLR x10 reps bilaterally with cues for positioning to improve hip strengthening;   patient in left/rightsidelying; PT performed soft tissue massage to right/leftposterior hip and lateral LE (gluteals/IT band) with rolling stick and tennis ball to reduce tightness and discomfort x74min total; Patient tolerated well with less tightness and less pain at end of session;   Sidelying: Clamshells 2x10 reps bilaterally with min Vcs for positioning to improve hip strengthening; UE shoulder ER x10 reps 1# handweight to facilitate better UE strengthening and scapular control;    Patient tolerated session well; She did have some discomfort with weight bearing tasks but reports less pain at rest;                     PT Education - 12/15/17 1122    Education provided  Yes    Education Details  HEP reinforced, LE strengthening, stretches;     Person(s) Educated  Patient    Methods  Explanation;Demonstration;Verbal cues    Comprehension  Verbalized understanding;Returned demonstration;Verbal cues required;Need further instruction       PT Short Term Goals - 11/12/17 1521      PT SHORT TERM GOAL #1   Title  Patient will reduce modified Oswestry score to <40% as to demonstrate less disability with ADLs including improved sleeping tolerance, walking/sitting tolerance etc for better mobility with ADLs.     Time  4    Period  Weeks    Status  New    Target Date  12/10/17      PT SHORT TERM GOAL #2   Title  Patient will increase BLE gross hip strength to 4/5 as to improve functional strength for independent gait,  increased standing tolerance and increased ADL ability.    Time  4    Period  Weeks    Status  New    Target Date  12/10/17      PT SHORT TERM GOAL #3   Title  Patient (> 22 years old) will complete five times sit to stand test in < 15 seconds indicating an increased LE strength and improved balance.    Time  4    Period  Weeks    Status  New    Target Date  12/10/17        PT Long Term Goals - 11/12/17 1523      PT LONG  TERM GOAL #1   Title  Patient will be independent in home exercise program to improve strength/mobility for better functional independence with ADLs.    Time  12    Period  Weeks    Status  New    Target Date  02/04/18      PT LONG TERM GOAL #2   Title  Patient will tolerate 5 seconds of single leg stance without loss of balance to improve ability to get in and out of shower safely.    Time  12    Period  Weeks    Status  New    Target Date  02/04/18      PT LONG TERM GOAL #3   Title  Patient will increase BLE gross strength to 4+/5 as to improve functional strength for independent gait, increased standing tolerance and increased ADL ability.    Time  12    Period  Weeks    Status  New    Target Date  02/04/18      PT LONG TERM GOAL #4   Title  Patient will report a worst pain of 3/10 on VAS in low back            to improve tolerance with ADLs and reduced symptoms with activities.     Time  12    Period  Weeks    Status  New    Target Date  02/04/18      PT LONG TERM GOAL #5   Title  Patient will reduce modified Oswestry score to <20 as to demonstrate minimal disability with ADLs including improved sleeping tolerance, walking/sitting tolerance etc for better mobility with ADLs.     Time  12    Period  Weeks    Status  New    Target Date  02/04/18            Plan - 12/15/17 1200    Clinical Impression Statement  Patient instructed in advanced LE strengthening exercise. Patient reports increased discomfort along posterior hip (especially  right) with increased weight bearing and hip strengthening exercise. However at rest reports less discomfort. PT identified increased tendenress along PSIS bilaterally; Patient educated on importance of soft tissue massage along posterior hip to help reduce sensitivity and tightness. Patient reports less pain at end of session;     Rehab Potential  Good    Clinical Impairments Affecting Rehab Potential  motivated, recent onset of deficits, negative: chronic pain;     PT Frequency  2x / week    PT Duration  12 weeks    PT Treatment/Interventions  Aquatic Therapy;Cryotherapy;Electrical Stimulation;Moist Heat;DME Instruction;Gait training;Stair training;Functional mobility training;Therapeutic activities;Therapeutic exercise;Manual techniques;Patient/family education;Neuromuscular re-education;Balance training;Passive range of motion;Energy conservation    PT Next Visit Plan  advance HEP    PT Home Exercise Plan  continue as given;     Consulted and Agree with Plan of Care  Patient       Patient will benefit from skilled therapeutic intervention in order to improve the following deficits and impairments:  Abnormal gait, Decreased endurance, Hypomobility, Decreased activity tolerance, Decreased knowledge of use of DME, Decreased strength, Pain, Increased fascial restricitons, Increased muscle spasms, Difficulty walking, Decreased mobility, Decreased balance, Decreased range of motion, Improper body mechanics, Postural dysfunction, Impaired flexibility  Visit Diagnosis: Chronic midline low back pain without sciatica  Abnormal posture  Difficulty in walking, not elsewhere classified  Muscle weakness (generalized)     Problem List There are no active problems  to display for this patient.   Trotter,Margaret PT DPT 12/15/2017, 12:02 PM  [place] MAIN [facility] SERVICES [address] [place], [place], [address] Phone: [phone]   Fax:   [phone]  Name: Maria Spencer MRN: [id number] Date of Birth: August 03, 1946

## 2017-12-18 ENCOUNTER — Encounter: Payer: Medicare Other | Admitting: Physical Therapy

## 2017-12-22 ENCOUNTER — Ambulatory Visit: Payer: Medicare Other | Admitting: Physical Therapy

## 2017-12-22 ENCOUNTER — Encounter: Payer: Self-pay | Admitting: Physical Therapy

## 2017-12-22 DIAGNOSIS — R293 Abnormal posture: Secondary | ICD-10-CM

## 2017-12-22 DIAGNOSIS — G8929 Other chronic pain: Secondary | ICD-10-CM | POA: Diagnosis not present

## 2017-12-22 DIAGNOSIS — M6281 Muscle weakness (generalized): Secondary | ICD-10-CM | POA: Diagnosis not present

## 2017-12-22 DIAGNOSIS — M545 Low back pain, unspecified: Secondary | ICD-10-CM

## 2017-12-22 DIAGNOSIS — R262 Difficulty in walking, not elsewhere classified: Secondary | ICD-10-CM | POA: Diagnosis not present

## 2017-12-22 NOTE — Therapy (Signed)
[place] MAIN [facility] SERVICES [address] [place], [place], [address] Phone: [phone]   Fax:  [phone]  Physical Therapy Treatment  Patient Details  Name: Maria Spencer MRN: [id number] Date of Birth: 05-May-1946 Referring Provider: Dr. Glade Nurse   Encounter Date: 12/22/2017  PT End of Session - 12/22/17 1119    Visit Number  7    Number of Visits  25    Date for PT Re-Evaluation  02/04/18    PT Start Time  1115    PT Stop Time  1200    PT Time Calculation (min)  45 min    Equipment Utilized During Treatment  Gait belt    Activity Tolerance  Patient limited by pain    Behavior During Therapy  [facility] for tasks assessed/performed       Past Medical History:  Diagnosis Date  . Arthritis    neck, back, left knee;   . Breast cancer ([place]) 2009  . GERD (gastroesophageal reflux disease)   . Hormone disorder   . Hyperlipidemia   . Hypertension    controlled well with medication;   . Thyroid disease     Past Surgical History:  Procedure Laterality Date  . BREAST LUMPECTOMY    . TOTAL SHOULDER REPLACEMENT      There were no vitals filed for this visit.  Subjective Assessment - 12/22/17 1118    Subjective  Patient reports a little stiffness in low back/right hip; She states, "I hung some curtains this weekend and that could have done it." She reports that she has been walking more but states that she is getting discouraged because she is still leaning to the side due to hip weakness;     Pertinent History  72 yo Female s/p scoliosis surgery with rod/screw placement from T10 to illium; Patient reports having increased pain in back since surgery; Her surgery was on 10/20/17 and on 10/23/17 she had a bone graft; Patient went and got stitches removed on 11/10/17; Patient goes back for a follow up on Januray 15; She presents to therapy with walking stick; Patient reports using walking stick most of the time; However at home she can walk to  bedroom/bathroom without the walking stick; She reports needing help with some ADLs including difficulty cleaning self, lower body dressing; denies any recent falls; She reports having some numbness in anterior left hip which the nurse practioner believes is related to bone graft; She reports having numbness in BLE prior to surgery but since surgery she is having numbness on anterior left leg which limits her ability to lift leg; Patient reports approximately 4 sleep disturbances per night related to back pain;     How long can you sit comfortably?  30 min;     How long can you stand comfortably?  10-15 min;     How long can you walk comfortably?  about 300-500 feet;     Diagnostic tests  Did x-rays which looked good after rod placement;     Patient Stated Goals  "To reduce pain and improve strength/mobility"    Currently in Pain?  Yes    Pain Score  3     Pain Location  Hip    Pain Orientation  Right;Posterior    Pain Descriptors / Indicators  Aching;Sore    Pain Type  Acute pain;Surgical pain    Pain Radiating Towards  radiates into low back;     Pain Onset  1 to 4 weeks ago  Pain Frequency  Intermittent    Aggravating Factors   worse with prolonged standing/walking;     Pain Relieving Factors  pain meds    Effect of Pain on Daily Activities  decreased activity tolerance;     Multiple Pain Sites  No           TREATMENT: Warm up on Nustep BUE/BLE level2x2min (unbilled) concurrent with moist heat to low back  Sittingwith small towel roll behind back: Diaphragmatic breathing with lower pelvic posterior/anterior rotation to tolerance; Required min Vcs for correct technique and positioning x5 reps; Alternate UE lift 1# x10 bilaterally with cues for core stabilization and to avoid painful ROM for better lumbar support;  Alternate march with lower abdominalstabilization green tband x12reps;with cues to increase diaphragmatic breathing for better hip flexion ROM with core  activation; Hip abductiongreentbandx12reps BLE with cues to avoid excessive ROM for better tolerance; Hamstring curl green tband x10 reps bilaterally with cues to avoid painful ROM;   Standing: Yellow tband around BLE: Hip abductionx10reps bilaterally with min A and mod VCs to improve hip positioning for better hip strengthening and stabilization; Patient had significant difficulty lifting LLE due to weakness; Hip extensionx10reps bilaterally with minimal difficulty; Side stepping x10 feet x2 laps each direction with cues to improve hip abduction for better strengthening;   Standing: LLE hip elevation to challenge RLE hip abduction position patient in left/rightsidelying;  Hooklying: Alternate march with yellow tband x10 bilaterally with cues to keep core abdominal stabilization for less pull on low back; Pball double knee to chest stretch 5 sec hold x5 reps to improve lumbopelvic stretch;   PT performed soft tissue massage to right/leftposterior hip and lateral LE (gluteals/IT band) with rolling stick and tennis ball to reduce tightness and discomfort x71min total; Patient tolerated well with less tightness and less pain at end of session;   Sidelying: Clamshellsyellow tband x10 reps bilaterally with min Vcs for positioning to improve hip strengthening;   Patient tolerated session well; She did have some discomfort with weight bearing tasks but reports less pain at rest;                      PT Education - 12/22/17 1119    Education provided  Yes    Education Details  HEP reinforced, LE strengthening, stretches;     Person(s) Educated  Patient    Methods  Explanation;Demonstration;Verbal cues    Comprehension  Verbalized understanding;Returned demonstration;Verbal cues required;Need further instruction       PT Short Term Goals - 11/12/17 1521      PT SHORT TERM GOAL #1   Title  Patient will reduce modified Oswestry score to <40% as to  demonstrate less disability with ADLs including improved sleeping tolerance, walking/sitting tolerance etc for better mobility with ADLs.     Time  4    Period  Weeks    Status  New    Target Date  12/10/17      PT SHORT TERM GOAL #2   Title  Patient will increase BLE gross hip strength to 4/5 as to improve functional strength for independent gait, increased standing tolerance and increased ADL ability.    Time  4    Period  Weeks    Status  New    Target Date  12/10/17      PT SHORT TERM GOAL #3   Title  Patient (> 75 years old) will complete five times sit to stand test in < 15  seconds indicating an increased LE strength and improved balance.    Time  4    Period  Weeks    Status  New    Target Date  12/10/17        PT Long Term Goals - 11/12/17 1523      PT LONG TERM GOAL #1   Title  Patient will be independent in home exercise program to improve strength/mobility for better functional independence with ADLs.    Time  12    Period  Weeks    Status  New    Target Date  02/04/18      PT LONG TERM GOAL #2   Title  Patient will tolerate 5 seconds of single leg stance without loss of balance to improve ability to get in and out of shower safely.    Time  12    Period  Weeks    Status  New    Target Date  02/04/18      PT LONG TERM GOAL #3   Title  Patient will increase BLE gross strength to 4+/5 as to improve functional strength for independent gait, increased standing tolerance and increased ADL ability.    Time  12    Period  Weeks    Status  New    Target Date  02/04/18      PT LONG TERM GOAL #4   Title  Patient will report a worst pain of 3/10 on VAS in low back            to improve tolerance with ADLs and reduced symptoms with activities.     Time  12    Period  Weeks    Status  New    Target Date  02/04/18      PT LONG TERM GOAL #5   Title  Patient will reduce modified Oswestry score to <20 as to demonstrate minimal disability with ADLs including improved  sleeping tolerance, walking/sitting tolerance etc for better mobility with ADLs.     Time  12    Period  Weeks    Status  New    Target Date  02/04/18            Plan - 12/22/17 1236    Clinical Impression Statement  Patient instructed in advanced LE hip strengthening exercise. Increased HEP with standing hip elevation to strengthening hip control and reduce trendelenburg. Patient able to tolerate hooklying position better today with LE exercise. She denies any increase in pain. patient would benefit from additional skilled PT intervention to improe strength, balance and gait safety;     Rehab Potential  Good    Clinical Impairments Affecting Rehab Potential  motivated, recent onset of deficits, negative: chronic pain;     PT Frequency  2x / week    PT Duration  12 weeks    PT Treatment/Interventions  Aquatic Therapy;Cryotherapy;Electrical Stimulation;Moist Heat;DME Instruction;Gait training;Stair training;Functional mobility training;Therapeutic activities;Therapeutic exercise;Manual techniques;Patient/family education;Neuromuscular re-education;Balance training;Passive range of motion;Energy conservation    PT Next Visit Plan  advance HEP    PT Home Exercise Plan  continue as given;     Consulted and Agree with Plan of Care  Patient       Patient will benefit from skilled therapeutic intervention in order to improve the following deficits and impairments:  Abnormal gait, Decreased endurance, Hypomobility, Decreased activity tolerance, Decreased knowledge of use of DME, Decreased strength, Pain, Increased fascial restricitons, Increased muscle spasms, Difficulty walking, Decreased mobility, Decreased balance,  Decreased range of motion, Improper body mechanics, Postural dysfunction, Impaired flexibility  Visit Diagnosis: Chronic midline low back pain without sciatica  Abnormal posture  Difficulty in walking, not elsewhere classified  Muscle weakness (generalized)     Problem  List There are no active problems to display for this patient.   Trotter,Margaret PT, DPT 12/22/2017, 12:40 PM  [place] MAIN [facility] SERVICES [address] [place], [place], [address] Phone: [phone]   Fax:  [phone]  Name: Alauna Hayden MRN: [id number] Date of Birth: Apr 03, 1946

## 2017-12-24 ENCOUNTER — Ambulatory Visit: Payer: Medicare Other | Admitting: Physical Therapy

## 2017-12-24 ENCOUNTER — Encounter: Payer: Self-pay | Admitting: Physical Therapy

## 2017-12-24 DIAGNOSIS — R293 Abnormal posture: Secondary | ICD-10-CM

## 2017-12-24 DIAGNOSIS — R262 Difficulty in walking, not elsewhere classified: Secondary | ICD-10-CM

## 2017-12-24 DIAGNOSIS — M545 Low back pain, unspecified: Secondary | ICD-10-CM

## 2017-12-24 DIAGNOSIS — M6281 Muscle weakness (generalized): Secondary | ICD-10-CM

## 2017-12-24 DIAGNOSIS — G8929 Other chronic pain: Secondary | ICD-10-CM

## 2017-12-24 NOTE — Therapy (Signed)
[place] MAIN [facility] SERVICES [address] [place], [place], [address] Phone: [phone]   Fax:  [phone]  Physical Therapy Treatment  Patient Details  Name: Maria Spencer MRN: [id number] Date of Birth: 1946/10/28 Referring Provider: Dr. Glade Nurse   Encounter Date: 12/24/2017  PT End of Session - 12/24/17 0931    Visit Number  8    Number of Visits  25    Date for PT Re-Evaluation  02/04/18    PT Start Time  0928    PT Stop Time  1010    PT Time Calculation (min)  42 min    Activity Tolerance  Patient tolerated treatment well;No increased pain    Behavior During Therapy  WFL for tasks assessed/performed       Past Medical History:  Diagnosis Date  . Arthritis    neck, back, left knee;   . Breast cancer ([place]) 2009  . GERD (gastroesophageal reflux disease)   . Hormone disorder   . Hyperlipidemia   . Hypertension    controlled well with medication;   . Thyroid disease     Past Surgical History:  Procedure Laterality Date  . BREAST LUMPECTOMY    . TOTAL SHOULDER REPLACEMENT      There were no vitals filed for this visit.  Subjective Assessment - 12/24/17 0926    Subjective  Patient reports doing pretty well. She denies any new changes. She is still having some hip discomfort and low back pain which is worse with prolonged standing/walking; Patient reports feeling increased soreness after last session; She reports doing a lot of housework yesterday but was able to do the standing hip lift exercise;     Pertinent History  72 yo Female s/p scoliosis surgery with rod/screw placement from T10 to illium; Patient reports having increased pain in back since surgery; Her surgery was on 10/20/17 and on 10/23/17 she had a bone graft; Patient went and got stitches removed on 11/10/17; Patient goes back for a follow up on Januray 15; She presents to therapy with walking stick; Patient reports using walking stick most of the time; However at  home she can walk to bedroom/bathroom without the walking stick; She reports needing help with some ADLs including difficulty cleaning self, lower body dressing; denies any recent falls; She reports having some numbness in anterior left hip which the nurse practioner believes is related to bone graft; She reports having numbness in BLE prior to surgery but since surgery she is having numbness on anterior left leg which limits her ability to lift leg; Patient reports approximately 4 sleep disturbances per night related to back pain;     How long can you sit comfortably?  30 min;     How long can you stand comfortably?  10-15 min;     How long can you walk comfortably?  about 300-500 feet;     Diagnostic tests  Did x-rays which looked good after rod placement;     Patient Stated Goals  "To reduce pain and improve strength/mobility"    Currently in Pain?  Yes    Pain Score  3     Pain Location  Back    Pain Orientation  Lower    Pain Descriptors / Indicators  Aching;Sore    Pain Type  Acute pain;Surgical pain    Pain Onset  1 to 4 weeks ago    Pain Frequency  Intermittent    Aggravating Factors   worse with prolonged standing/walking  Pain Relieving Factors  pain meds    Effect of Pain on Daily Activities  decreased activity tolerance;     Multiple Pain Sites  No              TREATMENT: Warm up on Nustep BUE/BLE level2x74min (unbilled) concurrent with moist heat to low back  Sittingwith small towel roll behind back: Diaphragmatic breathing with lower pelvic posterior/anterior rotation to tolerance; Required min Vcs for correct technique and positioning x5 reps; Alternate UE lift 1# x10 bilaterally with cues for core stabilization and to avoid painful ROM for better lumbar support; BUE shoulder flexion 1# x5 reps with cues for core stabilization and to avoid lumbar extension for better tolerance with arm lift;  Alternate UE/LE lift combo with cues for core stabilization to  challenge lumbar control and to improve core strength x10 reps each side;  Hip abductiongreentbandx12reps BLE with cues to avoid excessive ROM for better tolerance;  Standing: Yellow tband around BLE: Hip abductionx10reps bilaterally with min A and mod VCs to improve hip positioning for better hip strengthening and stabilization; Patient had significant difficulty lifting LLE due to weakness; Hip extensionx10reps bilaterally with minimal difficulty; Hip flexion x10 reps bilaterally with cues to improve postural control for better hip flexor strengthening;  Side stepping x10 feet x2 laps each direction with cues to improve hip abduction , larger step out, for better strengthening;   Standing: LLE hip elevation to challenge RLE hip abduction positionx10 reps LLE hip elevation with LLE toe taps to airex pad to facilitate better RLE hip abductor strengthening/motor control x10 reps;   Hooklying: Alternate march with yellow tband x10 bilaterally with cues to keep core abdominal stabilization for less pull on low back; Pball double knee to chest stretch 5 sec hold x5 reps to improve lumbopelvic stretch;   PT performed soft tissue massage to right/leftposterior hip and lateral LE (gluteals/IT band) with rolling stickand tennis ballto reduce tightness and discomfort; also performed soft tissue massage along quadratus lumborum to reduce tightness x80min total; Patient tolerated well with less tightness and less pain at end of session; She was able to exhibit better tissue mobility and less tenderness to palpation;   Sidelying: Clamshellsyellow tband x10 reps bilaterally with min Vcs for positioning to improve hip strengthening;   Patient tolerated session well; She did have some discomfort with weight bearing tasks but reports less pain at rest;                  PT Education - 12/24/17 0931    Education provided  Yes    Education Details  HEP reinforced,  LE strengthening, stretches;     Person(s) Educated  Patient    Methods  Explanation;Demonstration;Verbal cues    Comprehension  Verbalized understanding;Returned demonstration;Verbal cues required;Need further instruction       PT Short Term Goals - 11/12/17 1521      PT SHORT TERM GOAL #1   Title  Patient will reduce modified Oswestry score to <40% as to demonstrate less disability with ADLs including improved sleeping tolerance, walking/sitting tolerance etc for better mobility with ADLs.     Time  4    Period  Weeks    Status  New    Target Date  12/10/17      PT SHORT TERM GOAL #2   Title  Patient will increase BLE gross hip strength to 4/5 as to improve functional strength for independent gait, increased standing tolerance and increased ADL ability.  Time  4    Period  Weeks    Status  New    Target Date  12/10/17      PT SHORT TERM GOAL #3   Title  Patient (> 10 years old) will complete five times sit to stand test in < 15 seconds indicating an increased LE strength and improved balance.    Time  4    Period  Weeks    Status  New    Target Date  12/10/17        PT Long Term Goals - 11/12/17 1523      PT LONG TERM GOAL #1   Title  Patient will be independent in home exercise program to improve strength/mobility for better functional independence with ADLs.    Time  12    Period  Weeks    Status  New    Target Date  02/04/18      PT LONG TERM GOAL #2   Title  Patient will tolerate 5 seconds of single leg stance without loss of balance to improve ability to get in and out of shower safely.    Time  12    Period  Weeks    Status  New    Target Date  02/04/18      PT LONG TERM GOAL #3   Title  Patient will increase BLE gross strength to 4+/5 as to improve functional strength for independent gait, increased standing tolerance and increased ADL ability.    Time  12    Period  Weeks    Status  New    Target Date  02/04/18      PT LONG TERM GOAL #4   Title   Patient will report a worst pain of 3/10 on VAS in low back            to improve tolerance with ADLs and reduced symptoms with activities.     Time  12    Period  Weeks    Status  New    Target Date  02/04/18      PT LONG TERM GOAL #5   Title  Patient will reduce modified Oswestry score to <20 as to demonstrate minimal disability with ADLs including improved sleeping tolerance, walking/sitting tolerance etc for better mobility with ADLs.     Time  12    Period  Weeks    Status  New    Target Date  02/04/18            Plan - 12/24/17 1013    Clinical Impression Statement  Patient instructed in advanced LE hip strengthening exercise. Patient able to tolerate increased repetition and resistance today with less discomfort. She was able to exhibit better hip control with less trendelenburg in her hip during standing tband exercise. Patient does fatigue quickly. She responded well to manual therapy with less tenderness and better tissue extensibility. She would benefit from additional skilled PT intervention to improve strength, balance and gait safety while reducing back pain;     Rehab Potential  Good    Clinical Impairments Affecting Rehab Potential  motivated, recent onset of deficits, negative: chronic pain;     PT Frequency  2x / week    PT Duration  12 weeks    PT Treatment/Interventions  Aquatic Therapy;Cryotherapy;Electrical Stimulation;Moist Heat;DME Instruction;Gait training;Stair training;Functional mobility training;Therapeutic activities;Therapeutic exercise;Manual techniques;Patient/family education;Neuromuscular re-education;Balance training;Passive range of motion;Energy conservation    PT Next Visit Plan  advance HEP  PT Home Exercise Plan  continue as given;     Consulted and Agree with Plan of Care  Patient       Patient will benefit from skilled therapeutic intervention in order to improve the following deficits and impairments:  Abnormal gait, Decreased endurance,  Hypomobility, Decreased activity tolerance, Decreased knowledge of use of DME, Decreased strength, Pain, Increased fascial restricitons, Increased muscle spasms, Difficulty walking, Decreased mobility, Decreased balance, Decreased range of motion, Improper body mechanics, Postural dysfunction, Impaired flexibility  Visit Diagnosis: Chronic midline low back pain without sciatica  Abnormal posture  Difficulty in walking, not elsewhere classified  Muscle weakness (generalized)     Problem List There are no active problems to display for this patient.   Trotter,Margaret PT, DPT 12/24/2017, 10:14 AM  [place] MAIN [facility] SERVICES [address] [place], [place], [address] Phone: [phone]   Fax:  [phone]  Name: Derotha Fishbaugh MRN: [id number] Date of Birth: [birth year]

## 2017-12-25 ENCOUNTER — Encounter: Payer: Medicare Other | Admitting: Physical Therapy

## 2017-12-29 ENCOUNTER — Encounter: Payer: Self-pay | Admitting: Physical Therapy

## 2017-12-29 ENCOUNTER — Encounter: Payer: Self-pay | Admitting: Internal Medicine

## 2017-12-29 ENCOUNTER — Ambulatory Visit: Payer: Medicare Other | Attending: Neurosurgery | Admitting: Physical Therapy

## 2017-12-29 ENCOUNTER — Ambulatory Visit (INDEPENDENT_AMBULATORY_CARE_PROVIDER_SITE_OTHER): Payer: Medicare Other | Admitting: Internal Medicine

## 2017-12-29 VITALS — BP 126/86 | HR 67 | Temp 98.1°F | Ht 59.0 in | Wt 133.0 lb

## 2017-12-29 DIAGNOSIS — K589 Irritable bowel syndrome without diarrhea: Secondary | ICD-10-CM | POA: Insufficient documentation

## 2017-12-29 DIAGNOSIS — K582 Mixed irritable bowel syndrome: Secondary | ICD-10-CM

## 2017-12-29 DIAGNOSIS — E785 Hyperlipidemia, unspecified: Secondary | ICD-10-CM | POA: Diagnosis not present

## 2017-12-29 DIAGNOSIS — G8929 Other chronic pain: Secondary | ICD-10-CM | POA: Diagnosis not present

## 2017-12-29 DIAGNOSIS — R739 Hyperglycemia, unspecified: Secondary | ICD-10-CM | POA: Diagnosis not present

## 2017-12-29 DIAGNOSIS — E039 Hypothyroidism, unspecified: Secondary | ICD-10-CM | POA: Diagnosis not present

## 2017-12-29 DIAGNOSIS — R262 Difficulty in walking, not elsewhere classified: Secondary | ICD-10-CM | POA: Insufficient documentation

## 2017-12-29 DIAGNOSIS — M545 Low back pain, unspecified: Secondary | ICD-10-CM

## 2017-12-29 DIAGNOSIS — R293 Abnormal posture: Secondary | ICD-10-CM | POA: Diagnosis not present

## 2017-12-29 DIAGNOSIS — N390 Urinary tract infection, site not specified: Secondary | ICD-10-CM

## 2017-12-29 DIAGNOSIS — R110 Nausea: Secondary | ICD-10-CM | POA: Diagnosis not present

## 2017-12-29 DIAGNOSIS — Z853 Personal history of malignant neoplasm of breast: Secondary | ICD-10-CM | POA: Diagnosis not present

## 2017-12-29 DIAGNOSIS — Z23 Encounter for immunization: Secondary | ICD-10-CM | POA: Diagnosis not present

## 2017-12-29 DIAGNOSIS — Z1159 Encounter for screening for other viral diseases: Secondary | ICD-10-CM

## 2017-12-29 DIAGNOSIS — K580 Irritable bowel syndrome with diarrhea: Secondary | ICD-10-CM | POA: Diagnosis not present

## 2017-12-29 DIAGNOSIS — Z13818 Encounter for screening for other digestive system disorders: Secondary | ICD-10-CM

## 2017-12-29 DIAGNOSIS — M7122 Synovial cyst of popliteal space [Baker], left knee: Secondary | ICD-10-CM

## 2017-12-29 DIAGNOSIS — I10 Essential (primary) hypertension: Secondary | ICD-10-CM

## 2017-12-29 DIAGNOSIS — R109 Unspecified abdominal pain: Secondary | ICD-10-CM | POA: Insufficient documentation

## 2017-12-29 DIAGNOSIS — M6281 Muscle weakness (generalized): Secondary | ICD-10-CM

## 2017-12-29 DIAGNOSIS — R1032 Left lower quadrant pain: Secondary | ICD-10-CM | POA: Diagnosis not present

## 2017-12-29 MED ORDER — ATORVASTATIN CALCIUM 20 MG PO TABS: 10.0000 mg | ORAL_TABLET | Freq: Every day | ORAL | 0 refills | Status: DC

## 2017-12-29 MED ORDER — LISINOPRIL 20 MG PO TABS: 20.0000 mg | ORAL_TABLET | Freq: Every day | ORAL | 3 refills | Status: DC

## 2017-12-29 MED ORDER — LEVOTHYROXINE SODIUM 88 MCG PO TABS
88.0000 ug | ORAL_TABLET | Freq: Every day | ORAL | 3 refills | Status: DC
Start: 2017-12-29 — End: 2019-02-12

## 2017-12-29 NOTE — Therapy (Signed)
[place] MAIN [facility] SERVICES [address] [place], [place], [address] Phone: [phone]   Fax:  [phone]  Physical Therapy Treatment/Progress Note  Patient Details  Name: Maria Spencer MRN: [id number] Date of Birth: 1946/10/26 Referring Provider: Dr. Glade Nurse   Encounter Date: 12/29/2017  PT End of Session - 12/29/17 1435    Visit Number  9    Number of Visits  25    Date for PT Re-Evaluation  02/04/18    PT Start Time  2426    PT Stop Time  1515    PT Time Calculation (min)  44 min    Activity Tolerance  Patient tolerated treatment well;No increased pain    Behavior During Therapy  WFL for tasks assessed/performed       Past Medical History:  Diagnosis Date  . Arthritis    neck, back, left knee;   . Breast cancer ([place]) 2009  . GERD (gastroesophageal reflux disease)   . Hormone disorder   . Hyperlipidemia   . Hypertension    controlled well with medication;   . Thyroid disease     Past Surgical History:  Procedure Laterality Date  . BREAST LUMPECTOMY    . TOTAL SHOULDER REPLACEMENT      There were no vitals filed for this visit.  Subjective Assessment - 12/29/17 1433    Subjective  Patient reports doing a lot of exercise over the weekend and being more sore. She states, "I rode the nustep and walked 1/2 a mile and then I did the hip abductor/adductor machine and the 1# handweights and boy was I feeling it."     Pertinent History  72 yo Female s/p scoliosis surgery with rod/screw placement from T10 to illium; Patient reports having increased pain in back since surgery; Her surgery was on 10/20/17 and on 10/23/17 she had a bone graft; Patient went and got stitches removed on 11/10/17; Patient goes back for a follow up on Januray 15; She presents to therapy with walking stick; Patient reports using walking stick most of the time; However at home she can walk to bedroom/bathroom without the walking stick; She reports needing  help with some ADLs including difficulty cleaning self, lower body dressing; denies any recent falls; She reports having some numbness in anterior left hip which the nurse practioner believes is related to bone graft; She reports having numbness in BLE prior to surgery but since surgery she is having numbness on anterior left leg which limits her ability to lift leg; Patient reports approximately 4 sleep disturbances per night related to back pain;     How long can you sit comfortably?  30 min;     How long can you stand comfortably?  10-15 min;     How long can you walk comfortably?  about 300-500 feet;     Diagnostic tests  Did x-rays which looked good after rod placement;     Patient Stated Goals  "To reduce pain and improve strength/mobility"    Currently in Pain?  Yes    Pain Score  2     Pain Location  Back    Pain Orientation  Lower    Pain Descriptors / Indicators  Aching;Sore    Pain Type  Acute pain;Surgical pain    Pain Radiating Towards  radiates across low back;     Pain Onset  1 to 4 weeks ago    Pain Frequency  Intermittent    Aggravating Factors   worse  with prolonged standing/walking    Pain Relieving Factors  pain meds    Effect of Pain on Daily Activities  decreased activity tolerance;     Multiple Pain Sites  No         [facility] PT Assessment - 12/29/17 0001      Observation/Other Assessments   Modified Oswertry  36% (moderate disability) improved fro 68% on 11/12/17      Strength   Right Hip Flexion  4/5    Right Hip ABduction  3-/5    Right Hip ADduction  3+/5    Left Hip Flexion  4/5    Left Hip ABduction  3+/5    Left Hip ADduction  3+/5    Right Knee Flexion  4/5    Right Knee Extension  4/5    Left Knee Flexion  4/5    Left Knee Extension  4/5    Right Ankle Dorsiflexion  4/5    Right Ankle Plantar Flexion  4/5      Standardized Balance Assessment   Five times sit to stand comments   7 sec without HHA on chair; improved from 16.67 sec; low fall risk         TREATMENT: Warm up on Nustep BUE/BLE level2x47mn (unbilled) Instructed patient in 5 times sit<>Stand, modified oswestry and strength assessment to address goals, see above;  Sittingwith small towel roll behind back: Diaphragmatic breathing with lower pelvic posterior/anterior rotation to tolerance; Required min Vcs for correct technique and positioning x5reps; Alternate UE/LE lift combo with cues for core stabilization to challenge lumbar control and to improve core strength x10 reps each side;  Hip abductiongreentbandx15reps single LE, bilaterally with cues to avoid excessive ROM for better tolerance;  Standing: Yellow tband around BLE: Hip abductionx12reps bilaterally with min A and mod VCs to improve hip positioning for better hip strengthening and stabilization; Patient had significant difficulty lifting LLE due to weakness; Hip extensionx10reps bilaterally with minimal difficulty; Hip flexion x10 reps bilaterally with cues to improve postural control for better hip flexor strengthening;  Side stepping x10 feet x2 laps each direction with cues to improve hip abduction , larger step out, for better strengthening;   Standing: LLE hip elevation with LLE toe taps to airex pad to facilitate better RLE hip abductor strengthening/motor control x10 reps;   Sidelying: PT performed soft tissue massage to right/leftposterior hip and lateral LE (gluteals/IT band) with rolling stickand tennis ballto reduce tightness and discomfort; also performed soft tissue massage along quadratus lumborum to reduce tightness x12 min total (6 min each leg); Patient tolerated well with less tightness and less pain at end of session; She was able to exhibit better tissue mobility and less tenderness to palpation;   Sidelying: Clamshellsyellow tband 2x10reps bilaterally with min Vcs for positioning to improve hip strengthening;   Patient tolerated session well; Reports less pain at  end of session;                          PT Education - 12/29/17 1434    Education provided  Yes    Education Details  HEP reinforced, LE strengthening, stretches;     Person(s) Educated  Patient    Methods  Explanation;Demonstration;Verbal cues    Comprehension  Verbalized understanding;Returned demonstration;Verbal cues required;Need further instruction       PT Short Term Goals - 12/29/17 1436      PT SHORT TERM GOAL #1   Title  Patient will reduce  modified Oswestry score to <40% as to demonstrate less disability with ADLs including improved sleeping tolerance, walking/sitting tolerance etc for better mobility with ADLs.     Time  4    Period  Weeks    Status  Achieved    Target Date  12/10/17      PT SHORT TERM GOAL #2   Title  Patient will increase BLE gross hip strength to 4/5 as to improve functional strength for independent gait, increased standing tolerance and increased ADL ability.    Time  4    Period  Weeks    Status  Partially Met    Target Date  12/10/17      PT SHORT TERM GOAL #3   Title  Patient (> 87 years old) will complete five times sit to stand test in < 15 seconds indicating an increased LE strength and improved balance.    Time  4    Period  Weeks    Status  Achieved    Target Date  12/10/17        PT Long Term Goals - 12/29/17 1438      PT LONG TERM GOAL #1   Title  Patient will be independent in home exercise program to improve strength/mobility for better functional independence with ADLs.    Time  12    Period  Weeks    Status  On-going    Target Date  02/04/18      PT LONG TERM GOAL #2   Title  Patient will tolerate 5 seconds of single leg stance without loss of balance to improve ability to get in and out of shower safely.    Time  12    Period  Weeks    Status  Achieved    Target Date  02/04/18      PT LONG TERM GOAL #3   Title  Patient will increase BLE gross strength to 4+/5 as to improve functional  strength for independent gait, increased standing tolerance and increased ADL ability.    Time  12    Period  Weeks    Status  Partially Met    Target Date  02/04/18      PT LONG TERM GOAL #4   Title  Patient will report a worst pain of 3/10 on VAS in low back            to improve tolerance with ADLs and reduced symptoms with activities.     Time  12    Period  Weeks    Status  Partially Met    Target Date  02/04/18      PT LONG TERM GOAL #5   Title  Patient will reduce modified Oswestry score to <20 as to demonstrate minimal disability with ADLs including improved sleeping tolerance, walking/sitting tolerance etc for better mobility with ADLs.     Time  12    Period  Weeks    Status  Partially Met    Target Date  02/04/18            Plan - 12/29/17 1517    Clinical Impression Statement  Patient is progressing well. She exhibits improved strength and overall improved mobility. She has met most short term goals and has made progress towards her long term goals. Patient contineus to have weakness in hip abductors. Advanced LE strengthening with increased repetitions/resistance. Patient continues to ambulate with trendelenburg gait pattern but this is less than at initial eval. Patient would  benefit from additional skilled PT intervention to improve strength, balance and gait safety;     Rehab Potential  Good    Clinical Impairments Affecting Rehab Potential  motivated, recent onset of deficits, negative: chronic pain;     PT Frequency  2x / week    PT Duration  12 weeks    PT Treatment/Interventions  Aquatic Therapy;Cryotherapy;Electrical Stimulation;Moist Heat;DME Instruction;Gait training;Stair training;Functional mobility training;Therapeutic activities;Therapeutic exercise;Manual techniques;Patient/family education;Neuromuscular re-education;Balance training;Passive range of motion;Energy conservation    PT Next Visit Plan  advance HEP    PT Home Exercise Plan  continue as  given;     Consulted and Agree with Plan of Care  Patient       Patient will benefit from skilled therapeutic intervention in order to improve the following deficits and impairments:  Abnormal gait, Decreased endurance, Hypomobility, Decreased activity tolerance, Decreased knowledge of use of DME, Decreased strength, Pain, Increased fascial restricitons, Increased muscle spasms, Difficulty walking, Decreased mobility, Decreased balance, Decreased range of motion, Improper body mechanics, Postural dysfunction, Impaired flexibility  Visit Diagnosis: Chronic midline low back pain without sciatica  Abnormal posture  Difficulty in walking, not elsewhere classified  Muscle weakness (generalized)     Problem List There are no active problems to display for this patient.   Trotter,Margaret PT, DPT 12/29/2017, 3:25 PM  [place] MAIN [facility] SERVICES [address] [place], [place], [address] Phone: [phone]   Fax:  [phone]  Name: Justene Jensen MRN: [id number] Date of Birth: [birth year]

## 2017-12-29 NOTE — Patient Instructions (Signed)
Please sch fasting labs in the am and follow up in 02/2018 for your physical  Take care   Diet for Irritable Bowel Syndrome When you have irritable bowel syndrome (IBS), the foods you eat and your eating habits are very important. IBS may cause various symptoms, such as abdominal pain, constipation, or diarrhea. Choosing the right foods can help ease discomfort caused by these symptoms. Work with your health care provider and dietitian to find the best eating plan to help control your symptoms. What general guidelines do I need to follow?  Keep a food diary. This will help you identify foods that cause symptoms. Write down: ? What you eat and when. ? What symptoms you have. ? When symptoms occur in relation to your meals.  Avoid foods that cause symptoms. Talk with your dietitian about other ways to get the same nutrients that are in these foods.  Eat more foods that contain fiber. Take a fiber supplement if directed by your dietitian.  Eat your meals slowly, in a relaxed setting.  Aim to eat 5-6 small meals per day. Do not skip meals.  Drink enough fluids to keep your urine clear or pale yellow.  Ask your health care provider if you should take an over-the-counter probiotic during flare-ups to help restore healthy gut bacteria.  If you have cramping or diarrhea, try making your meals low in fat and high in carbohydrates. Examples of carbohydrates are pasta, rice, whole grain breads and cereals, fruits, and vegetables.  If dairy products cause your symptoms to flare up, try eating less of them. You might be able to handle yogurt better than other dairy products because it contains bacteria that help with digestion. What foods are not recommended? The following are some foods and drinks that may worsen your symptoms:  Fatty foods, such as [place] fries.  Milk products, such as cheese or ice cream.  Chocolate.  Alcohol.  Products with caffeine, such as coffee.  Carbonated drinks,  such as soda.  The items listed above may not be a complete list of foods and beverages to avoid. Contact your dietitian for more information. What foods are good sources of fiber? Your health care provider or dietitian may recommend that you eat more foods that contain fiber. Fiber can help reduce constipation and other IBS symptoms. Add foods with fiber to your diet a little at a time so that your body can get used to them. Too much fiber at once might cause gas and swelling of your abdomen. The following are some foods that are good sources of fiber:  Apples.  Peaches.  Pears.  Berries.  Figs.  Broccoli (raw).  Cabbage.  Carrots.  Raw peas.  Kidney beans.  Lima beans.  Whole grain bread.  Whole grain cereal.  Where to find more information: [employer] for Functional Gastrointestinal Disorders: www.iffgd.[employer] of Diabetes and Digestive and Kidney Diseases: NetworkAffair.co.za.aspx This information is not intended to replace advice given to you by your health care provider. Make sure you discuss any questions you have with your health care provider. Document Released: 02/01/2004 Document Revised: 04/18/2016 Document Reviewed: 02/11/2014 Elsevier Interactive Patient Education  2018 [place].  Irritable Bowel Syndrome, Adult Irritable bowel syndrome (IBS) is not one specific disease. It is a group of symptoms that affects the organs responsible for digestion (gastrointestinal or GI tract). To regulate how your GI tract works, your body sends signals back and forth between your intestines and your brain. If you have IBS,  there may be a problem with these signals. As a result, your GI tract does not function normally. Your intestines may become more sensitive and overreact to certain things. This is especially true when you eat certain foods or when you are under stress. There  are four types of IBS. These may be determined based on the consistency of your stool:  IBS with diarrhea.  IBS with constipation.  Mixed IBS.  Unsubtyped IBS.  It is important to know which type of IBS you have. Some treatments are more likely to be helpful for certain types of IBS. What are the causes? The exact cause of IBS is not known. What increases the risk? You may have a higher risk of IBS if:  You are a woman.  You are younger than 72 years old.  You have a family history of IBS.  You have mental health problems.  You have had bacterial infection of your GI tract.  What are the signs or symptoms? Symptoms of IBS vary from person to person. The main symptom is abdominal pain or discomfort. Additional symptoms usually include one or more of the following:  Diarrhea, constipation, or both.  Abdominal swelling or bloating.  Feeling full or sick after eating a small or regular-size meal.  Frequent gas.  Mucus in the stool.  A feeling of having more stool left after a bowel movement.  Symptoms tend to come and go. They may be associated with stress, psychiatric conditions, or nothing at all. How is this diagnosed? There is no specific test to diagnose IBS. Your health care provider will make a diagnosis based on a physical exam, medical history, and your symptoms. You may have other tests to rule out other conditions that may be causing your symptoms. These may include:  Blood tests.  X-rays.  CT scan.  Endoscopy and colonoscopy. This is a test in which your GI tract is viewed with a long, thin, flexible tube.  How is this treated? There is no cure for IBS, but treatment can help relieve symptoms. IBS treatment often includes:  Changes to your diet, such as: ? Eating more fiber. ? Avoiding foods that cause symptoms. ? Drinking more water. ? Eating regular, medium-sized portioned meals.  Medicines. These may include: ? Fiber supplements if you have  constipation. ? Medicine to control diarrhea (antidiarrheal medicines). ? Medicine to help control muscle spasms in your GI tract (antispasmodic medicines). ? Medicines to help with any mental health issues, such as antidepressants or tranquilizers.  Therapy. ? Talk therapy may help with anxiety, depression, or other mental health issues that can make IBS symptoms worse.  Stress reduction. ? Managing your stress can help keep symptoms under control.  Follow these instructions at home:  Take medicines only as directed by your health care provider.  Eat a healthy diet. ? Avoid foods and drinks with added sugar. ? Include more whole grains, fruits, and vegetables gradually into your diet. This may be especially helpful if you have IBS with constipation. ? Avoid any foods and drinks that make your symptoms worse. These may include dairy products and caffeinated or carbonated drinks. ? Do not eat large meals. ? Drink enough fluid to keep your urine clear or pale yellow.  Exercise regularly. Ask your health care provider for recommendations of good activities for you.  Keep all follow-up visits as directed by your health care provider. This is important. Contact a health care provider if:  You have constant pain.  You have  trouble or pain with swallowing.  You have worsening diarrhea. Get help right away if:  You have severe and worsening abdominal pain.  You have diarrhea and: ? You have a rash, stiff neck, or severe headache. ? You are irritable, sleepy, or difficult to awaken. ? You are weak, dizzy, or extremely thirsty.  You have bright red blood in your stool or you have black tarry stools.  You have unusual abdominal swelling that is painful.  You vomit continuously.  You vomit blood (hematemesis).  You have both abdominal pain and a fever. This information is not intended to replace advice given to you by your health care provider. Make sure you discuss any  questions you have with your health care provider. Document Released: 11/11/2005 Document Revised: 04/12/2016 Document Reviewed: 07/29/2014 Elsevier Interactive Patient Education  2018 [employer].

## 2017-12-29 NOTE — Progress Notes (Signed)
Pre visit review using our clinic review tool, if applicable. No additional management support is needed unless otherwise documented below in the visit note. 

## 2017-12-30 ENCOUNTER — Other Ambulatory Visit (INDEPENDENT_AMBULATORY_CARE_PROVIDER_SITE_OTHER): Payer: Medicare Other

## 2017-12-30 DIAGNOSIS — E039 Hypothyroidism, unspecified: Secondary | ICD-10-CM | POA: Diagnosis not present

## 2017-12-30 DIAGNOSIS — R739 Hyperglycemia, unspecified: Secondary | ICD-10-CM | POA: Diagnosis not present

## 2017-12-30 DIAGNOSIS — Z13818 Encounter for screening for other digestive system disorders: Secondary | ICD-10-CM | POA: Diagnosis not present

## 2017-12-30 DIAGNOSIS — E785 Hyperlipidemia, unspecified: Secondary | ICD-10-CM | POA: Diagnosis not present

## 2017-12-30 DIAGNOSIS — Z1159 Encounter for screening for other viral diseases: Secondary | ICD-10-CM | POA: Diagnosis not present

## 2017-12-30 DIAGNOSIS — I10 Essential (primary) hypertension: Secondary | ICD-10-CM

## 2017-12-30 LAB — HEPATIC FUNCTION PANEL
ALT: 9 U/L (ref 0–35)
AST: 15 U/L (ref 0–37)
Albumin: 4.1 g/dL (ref 3.5–5.2)
Alkaline Phosphatase: 119 U/L — ABNORMAL HIGH (ref 39–117)
Bilirubin, Direct: 0.1 mg/dL (ref 0.0–0.3)
Total Bilirubin: 0.6 mg/dL (ref 0.2–1.2)
Total Protein: 7.5 g/dL (ref 6.0–8.3)

## 2017-12-30 LAB — CBC WITH DIFFERENTIAL/PLATELET
Basophils Absolute: 0.1 10*3/uL (ref 0.0–0.1)
Basophils Relative: 0.9 % (ref 0.0–3.0)
Eosinophils Absolute: 0.2 10*3/uL (ref 0.0–0.7)
Eosinophils Relative: 2.9 % (ref 0.0–5.0)
HCT: 37.5 % (ref 36.0–46.0)
Hemoglobin: 11.9 g/dL — ABNORMAL LOW (ref 12.0–15.0)
Lymphocytes Relative: 14 % (ref 12.0–46.0)
Lymphs Abs: 0.9 10*3/uL (ref 0.7–4.0)
MCHC: 31.7 g/dL (ref 30.0–36.0)
MCV: 79.1 fl (ref 78.0–100.0)
Monocytes Absolute: 0.4 10*3/uL (ref 0.1–1.0)
Monocytes Relative: 6 % (ref 3.0–12.0)
Neutro Abs: 5 10*3/uL (ref 1.4–7.7)
Neutrophils Relative %: 76.2 % (ref 43.0–77.0)
Platelets: 306 10*3/uL (ref 150.0–400.0)
RBC: 4.74 Mil/uL (ref 3.87–5.11)
RDW: 16.2 % — ABNORMAL HIGH (ref 11.5–15.5)
WBC: 6.6 10*3/uL (ref 4.0–10.5)

## 2017-12-30 LAB — LIPID PANEL
Cholesterol: 208 mg/dL — ABNORMAL HIGH (ref 0–200)
HDL: 75.8 mg/dL (ref >39.00)
LDL Cholesterol: 109 mg/dL — ABNORMAL HIGH (ref 0–99)
NonHDL: 132.03
Total CHOL/HDL Ratio: 3
Triglycerides: 115 mg/dL (ref 0.0–149.0)
VLDL: 23 mg/dL (ref 0.0–40.0)

## 2017-12-30 LAB — HEMOGLOBIN A1C: Hgb A1c MFr Bld: 5.8 % (ref 4.6–6.5)

## 2017-12-30 LAB — TSH: TSH: 3.56 u[IU]/mL (ref 0.35–4.50)

## 2017-12-31 ENCOUNTER — Encounter: Payer: Self-pay | Admitting: Internal Medicine

## 2017-12-31 DIAGNOSIS — N390 Urinary tract infection, site not specified: Secondary | ICD-10-CM | POA: Insufficient documentation

## 2017-12-31 LAB — HEPATITIS B SURFACE ANTIGEN: Hepatitis B Surface Ag: NONREACTIVE

## 2017-12-31 LAB — HEPATITIS C ANTIBODY
Hepatitis C Ab: NONREACTIVE
SIGNAL TO CUT-OFF: 0.02 (ref <1.00)

## 2017-12-31 LAB — HEPATITIS B SURFACE ANTIBODY, QUANTITATIVE: Hepatitis B-Post: <5 m[IU]/mL — ABNORMAL LOW (ref >=10)

## 2017-12-31 NOTE — Progress Notes (Signed)
Chief Complaint  Patient presents with  . Establish Care   New patients [place]. [place] doctors. She will be due for physical 02/2018   1. HTN controlled on Lis 20 mg qd  2. HLD unable to tolerate lipitor 20 mg so taking 10 mg qhs  3. 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 She tries Immodium prn and when constipated Miralax and Senokot when she was on narcotics. She also c/o chronic nausea and reduced appetite.   4. s/p back surgery 09/2017 with 9 vertebra fused, 2 rods and 20 screws at [facility] 5. Personal hx of breast cancer s/p right lumpectomy no chemo s/p radiation.   6. H/o recurrent uti saw urology sx's controlled on Macrobid for now     Review of Systems  Constitutional: Negative for weight loss.  HENT: Negative for hearing loss.   Eyes:       No vision problems   Respiratory: Negative for shortness of breath.   Cardiovascular: Negative for chest pain.  Gastrointestinal: Positive for constipation, diarrhea and nausea. Negative for abdominal pain.       +reduced appetite   Genitourinary: Negative for dysuria.  Musculoskeletal: Negative for falls.  Skin: Negative for rash.  Neurological: Negative for headaches.  Psychiatric/Behavioral: Negative for memory loss.   Past Medical History:  Diagnosis Date  . Arthritis    neck, back, left knee;   . Breast cancer ([place]) 2009  . GERD (gastroesophageal reflux disease)   . Hormone disorder   . Hyperlipidemia   . Hypertension    controlled well with medication;   . Thyroid disease    Past Surgical History:  Procedure Laterality Date  . BREAST LUMPECTOMY    . TOTAL SHOULDER REPLACEMENT     Family History  Problem Relation Age of Onset  . Bladder Cancer Neg Hx   . Kidney cancer Neg Hx    Social History   Socioeconomic History  . Marital status: Married    Spouse name: Not on file  . Number of children: Not on file  . Years of education: Not on file  .  Highest education level: Not on file  Social Needs  . Financial resource strain: Not on file  . Food insecurity - worry: Not on file  . Food insecurity - inability: Not on file  . Transportation needs - medical: Not on file  . Transportation needs - non-medical: Not on file  Occupational History  . Not on file  Tobacco Use  . Smoking status: Never Smoker  . Smokeless tobacco: Never Used  Substance and Sexual Activity  . Alcohol use: Yes  . Drug use: No  . Sexual activity: Not on file  Other Topics Concern  . Not on file  Social History Narrative  . Not on file   Current Meds  Medication Sig  . atorvastatin (LIPITOR) 20 MG tablet Take 0.5 tablets (10 mg total) by mouth daily at 6 PM.  . levothyroxine (SYNTHROID, LEVOTHROID) 88 MCG tablet Take 1 tablet (88 mcg total) by mouth daily before breakfast.  . lisinopril (PRINIVIL,ZESTRIL) 20 MG tablet Take 1 tablet (20 mg total) by mouth daily.  . Multiple Vitamin (MULTI-VITAMINS) TABS Take by mouth.  . nitrofurantoin, macrocrystal-monohydrate, (MACROBID) 100 MG capsule Take 1 capsule (100 mg total) daily by mouth.  Marland Kitchen oxycodone (OXY-IR) 5 MG capsule Take 5 mg by mouth every 4 (four) hours as needed.  . saccharomyces boulardii (FLORASTOR) 250 MG  capsule Take 250 mg 2 (two) times daily by mouth.  . zolpidem (AMBIEN) 5 MG tablet Take by mouth.  . [DISCONTINUED] atorvastatin (LIPITOR) 20 MG tablet   . [DISCONTINUED] atorvastatin (LIPITOR) 20 MG tablet Take 0.5 tablets (10 mg total) by mouth daily at 6 PM.  . [DISCONTINUED] levothyroxine (SYNTHROID, LEVOTHROID) 88 MCG tablet Take by mouth.  . [DISCONTINUED] lisinopril (PRINIVIL,ZESTRIL) 20 MG tablet Take by mouth.   Allergies  Allergen Reactions  . Sulfa Antibiotics Itching    Hives anaphylaxis   Recent Results (from the past 2160 hour(s))  Basic metabolic panel     Status: Abnormal   Collection Time: 10/29/17  2:45 PM  Result Value Ref Range   Sodium 136 135 - 145 mmol/L   Potassium  3.7 3.5 - 5.1 mmol/L   Chloride 99 (L) 101 - 111 mmol/L   CO2 28 22 - 32 mmol/L   Glucose, Bld 124 (H) 65 - 99 mg/dL   BUN 11 6 - 20 mg/dL   Creatinine, Ser 0.48 0.44 - 1.00 mg/dL   Calcium 8.4 (L) 8.9 - 10.3 mg/dL   GFR calc non Af Amer >60 >60 mL/min   GFR calc Af Amer >60 >60 mL/min    Comment: (NOTE) The eGFR has been calculated using the CKD EPI equation. This calculation has not been validated in all clinical situations. eGFR's persistently <60 mL/min signify possible Chronic Kidney Disease.    Anion gap 9 5 - 15  CBC     Status: Abnormal   Collection Time: 10/29/17  2:45 PM  Result Value Ref Range   WBC 7.4 3.6 - 11.0 K/uL   RBC 3.06 (L) 3.80 - 5.20 MIL/uL   Hemoglobin 8.5 (L) 12.0 - 16.0 g/dL   HCT 25.8 (L) 35.0 - 47.0 %   MCV 84.5 80.0 - 100.0 fL   MCH 27.7 26.0 - 34.0 pg   MCHC 32.8 32.0 - 36.0 g/dL   RDW 14.9 (H) 11.5 - 14.5 %   Platelets 404 150 - 440 K/uL  Hepatitis C antibody     Status: None   Collection Time: 12/30/17  8:36 AM  Result Value Ref Range   Hepatitis C Ab NON-REACTIVE NON-REACTI   SIGNAL TO CUT-OFF 0.02 <1.00  Hepatitis B surface antigen     Status: None   Collection Time: 12/30/17  8:36 AM  Result Value Ref Range   Hepatitis B Surface Ag NON-REACTIVE NON-REACTI  Hepatitis B surface antibody     Status: Abnormal   Collection Time: 12/30/17  8:36 AM  Result Value Ref Range   Hepatitis B-Post <5 (L) > OR = 10 mIU/mL    Comment: . Patient does not have immunity to hepatitis B virus. . For additional information, please refer to http://education.[facility].com/faq/FAQ105 (This link is being provided for informational/ educational purposes only).   Lipid panel     Status: Abnormal   Collection Time: 12/30/17  8:36 AM  Result Value Ref Range   Cholesterol 208 (H) 0 - 200 mg/dL    Comment: ATP III Classification       Desirable:  < 200 mg/dL               Borderline High:  200 - 239 mg/dL          High:  > = 240 mg/dL   Triglycerides  115.0 0.0 - 149.0 mg/dL    Comment: Normal:  <150 mg/dLBorderline High:  150 - 199 mg/dL   HDL 75.80 >39.00 mg/dL  VLDL 23.0 0.0 - 40.0 mg/dL   LDL Cholesterol 109 (H) 0 - 99 mg/dL   Total CHOL/HDL Ratio 3     Comment:                Men          Women1/2 Average Risk     3.4          3.3Average Risk          5.0          4.42X Average Risk          9.6          7.13X Average Risk          15.0          11.0                       NonHDL 132.03     Comment: NOTE:  Non-HDL goal should be 30 mg/dL higher than patient's LDL goal (i.e. LDL goal of < 70 mg/dL, would have non-HDL goal of < 100 mg/dL)  Hemoglobin A1c     Status: None   Collection Time: 12/30/17  8:36 AM  Result Value Ref Range   Hgb A1c MFr Bld 5.8 4.6 - 6.5 %    Comment: Glycemic Control Guidelines for People with Diabetes:Non Diabetic:  <6%Goal of Therapy: <7%Additional Action Suggested:  >8%   TSH     Status: None   Collection Time: 12/30/17  8:36 AM  Result Value Ref Range   TSH 3.56 0.35 - 4.50 uIU/mL  Hepatic function panel     Status: Abnormal   Collection Time: 12/30/17  8:36 AM  Result Value Ref Range   Total Bilirubin 0.6 0.2 - 1.2 mg/dL   Bilirubin, Direct 0.1 0.0 - 0.3 mg/dL   Alkaline Phosphatase 119 (H) 39 - 117 U/L   AST 15 0 - 37 U/L   ALT 9 0 - 35 U/L   Total Protein 7.5 6.0 - 8.3 g/dL   Albumin 4.1 3.5 - 5.2 g/dL  CBC with Differential/Platelet     Status: Abnormal   Collection Time: 12/30/17  8:36 AM  Result Value Ref Range   WBC 6.6 4.0 - 10.5 K/uL   RBC 4.74 3.87 - 5.11 Mil/uL   Hemoglobin 11.9 (L) 12.0 - 15.0 g/dL   HCT 37.5 36.0 - 46.0 %   MCV 79.1 78.0 - 100.0 fl   MCHC 31.7 30.0 - 36.0 g/dL   RDW 16.2 (H) 11.5 - 15.5 %   Platelets 306.0 150.0 - 400.0 K/uL   Neutrophils Relative % 76.2 43.0 - 77.0 %   Lymphocytes Relative 14.0 12.0 - 46.0 %   Monocytes Relative 6.0 3.0 - 12.0 %   Eosinophils Relative 2.9 0.0 - 5.0 %   Basophils Relative 0.9 0.0 - 3.0 %   Neutro Abs 5.0 1.4 - 7.7 K/uL    Lymphs Abs 0.9 0.7 - 4.0 K/uL   Monocytes Absolute 0.4 0.1 - 1.0 K/uL   Eosinophils Absolute 0.2 0.0 - 0.7 K/uL   Basophils Absolute 0.1 0.0 - 0.1 K/uL   Objective  Body mass index is 26.86 kg/m. Wt Readings from Last 3 Encounters:  12/29/17 133 lb (60.3 kg)  10/29/17 141 lb (64 kg)  09/29/17 132 lb 9.6 oz (60.1 kg)   Temp Readings from Last 3 Encounters:  12/29/17 98.1 F (36.7 C) (Oral)  10/29/17 97.6 F (36.4 C) (Oral)  BP Readings from Last 3 Encounters:  12/29/17 126/86  10/29/17 (!) 118/92  09/29/17 (!) 150/90   Pulse Readings from Last 3 Encounters:  12/29/17 67  10/29/17 91  09/29/17 92   O2 sat room air 98%  Physical Exam  Constitutional: She is oriented to person, place, and time and well-developed, well-nourished, and in no distress. Vital signs are normal.  HENT:  Head: Normocephalic and atraumatic.  Mouth/Throat: Oropharynx is clear and moist and mucous membranes are normal.  Eyes: Conjunctivae are normal. Pupils are equal, round, and reactive to light.  Cardiovascular: Normal rate, regular rhythm and normal heart sounds.  Pulmonary/Chest: Effort normal and breath sounds normal.  Abdominal: Soft. Bowel sounds are normal. There is tenderness in the left lower quadrant.  Neurological: She is alert and oriented to person, place, and time. Gait normal. Gait normal.  Skin: Skin is warm and dry.     Psychiatric: Mood, memory, affect and judgment normal.  Nursing note and vitals reviewed.   Assessment   1. HTN 2. HLD 3. IBS with diarrhea mostly and constipation. LLQ ab pain, chronic nausea 4.h/o breast cancer dx'ed 2009 s/p right lumpectomy  5. Hypothyroidism  6. H/o recurrent UTI  7. HM  Plan   1. Cont Lis 20 refilled today  Check lfts, CBC, TSH, A1C, lipid, hep B/C status  2. Cont lipitor 10 mg qhs  3. Refer to GI Dr. Allen Norris  4. mammo due 03/2018 has not had f/u H/o  5. Check TSH cont levothyroxine  6. Doing well on macrobid  7.   Flu shot had  08/25/17  prevnar 12/29/17  pna 23 last ? 2017 per pt need to find record  Need to disc shingrix Tdap and find record per pt last 2017 Tdap  zostavax had 2012   No h/o abnormal pap DEXA h/o osteopenia wait on repeat  Colonoscopy pending records last 2015 per pt FH colon cancer in father  mammo due 03/2018  Never smoker   Pending records [place], Dr. Phill Mutter [place] PCP of 20 years and colonoscopy  Provider: Dr. Olivia Mackie McLean-Scocuzza-[facility]

## 2018-01-01 ENCOUNTER — Encounter: Payer: Self-pay | Admitting: Physical Therapy

## 2018-01-01 ENCOUNTER — Ambulatory Visit: Payer: Medicare Other | Admitting: Physical Therapy

## 2018-01-01 DIAGNOSIS — M6281 Muscle weakness (generalized): Secondary | ICD-10-CM | POA: Diagnosis not present

## 2018-01-01 DIAGNOSIS — R262 Difficulty in walking, not elsewhere classified: Secondary | ICD-10-CM | POA: Diagnosis not present

## 2018-01-01 DIAGNOSIS — G8929 Other chronic pain: Secondary | ICD-10-CM

## 2018-01-01 DIAGNOSIS — R293 Abnormal posture: Secondary | ICD-10-CM | POA: Diagnosis not present

## 2018-01-01 DIAGNOSIS — M545 Low back pain, unspecified: Secondary | ICD-10-CM

## 2018-01-01 NOTE — Therapy (Signed)
[place] MAIN [facility] SERVICES [address] [place], [place], [address] Phone: [phone]   Fax:  [phone]  Physical Therapy Treatment  Patient Details  Name: Maria Spencer MRN: [id number] Date of Birth: 26-Jul-1946 Referring Provider: Dr. Glade Nurse   Encounter Date: 01/01/2018  PT End of Session - 01/01/18 1441    Visit Number  10    Number of Visits  25    Date for PT Re-Evaluation  02/04/18    PT Start Time  0932    PT Stop Time  1515    PT Time Calculation (min)  41 min    Activity Tolerance  Patient tolerated treatment well;No increased pain    Behavior During Therapy  WFL for tasks assessed/performed       Past Medical History:  Diagnosis Date  . Arthritis    neck, back, left knee;   . Breast cancer [facility]) 2009   right lumpectomy s/p radiation   . Chicken pox   . Chronic UTI    established with urology   . Diverticular disease    -osis and -itis   . GERD (gastroesophageal reflux disease)   . Hormone disorder   . Hyperlipidemia   . Hypertension    controlled well with medication;   . Thyroid disease    hypothyroidism     Past Surgical History:  Procedure Laterality Date  . BONE GRAFT HIP ILIAC CREST     + cage left hip 10/23/17   . BREAST LUMPECTOMY     2009 lumpectomy   . SHOULDER SURGERY     x2 surgeries both shoulders, right shoulder replacement last in 2012   . SPINE SURGERY     spinal 09/2017 h/o scoliosis   . TONSILLECTOMY     age 1 y.o.   . TOTAL SHOULDER REPLACEMENT      There were no vitals filed for this visit.  Subjective Assessment - 01/01/18 1439    Subjective  Patient reports a little soreness today; She reports, "I went hiking yesterday and after a mile my hip was really hurting." She reports not using ice afterwards; Reports compliance with HEP;     Pertinent History  72 yo Female s/p scoliosis surgery with rod/screw placement from T10 to illium; Patient reports having increased pain in back  since surgery; Her surgery was on 10/20/17 and on 10/23/17 she had a bone graft; Patient went and got stitches removed on 11/10/17; Patient goes back for a follow up on Januray 15; She presents to therapy with walking stick; Patient reports using walking stick most of the time; However at home she can walk to bedroom/bathroom without the walking stick; She reports needing help with some ADLs including difficulty cleaning self, lower body dressing; denies any recent falls; She reports having some numbness in anterior left hip which the nurse practioner believes is related to bone graft; She reports having numbness in BLE prior to surgery but since surgery she is having numbness on anterior left leg which limits her ability to lift leg; Patient reports approximately 4 sleep disturbances per night related to back pain;     How long can you sit comfortably?  30 min;     How long can you stand comfortably?  10-15 min;     How long can you walk comfortably?  about 300-500 feet;     Diagnostic tests  Did x-rays which looked good after rod placement;     Patient Stated Goals  "To reduce  pain and improve strength/mobility"    Currently in Pain?  Yes    Pain Score  2     Pain Location  Back    Pain Orientation  Lower    Pain Descriptors / Indicators  Aching;Sore    Pain Type  Acute pain;Surgical pain    Pain Radiating Towards  radiates across low back;     Pain Frequency  Intermittent    Aggravating Factors   worse with prlonged standing/walking    Pain Relieving Factors  pain meds    Effect of Pain on Daily Activities  decreased activity tolerance;     Multiple Pain Sites  No            TREATMENT: Warm up on Nustep BUE/BLE level2x39mn (unbilled)  Sitting: Diaphragmatic breathing with lower pelvic posterior/anterior rotation to tolerance; Required min Vcs for correct technique and positioning x5reps; Alternate UE lift 2# x10 bilaterally with cues for core stabilization and to avoid painful  ROM for better lumbar support;  Alternate UE/LE lift combo (2# ankle/hands) with cues for core stabilization to challenge lumbar control and to improve core strength x10 reps each side;   Standing: Red tband around BLE: Hip abductionx10reps bilaterally with min A and mod VCs to improve hip positioning for better hip strengthening and stabilization; Patient had significant difficulty lifting LLE due to weakness; Hip extensionx10reps bilaterally with minimal difficulty; Side stepping x10 feet x2 laps each direction with cues to improve hip abduction , larger step out, for better strengthening;   Standing: LLE hip elevation to challenge RLE hip abduction positionx15 reps LLE hip elevation with LLE toe taps to airex pad to facilitate better RLE hip abductor strengthening/motor control x10 reps;    PT performed soft tissue massage to right/leftposterior hip and lateral LE (gluteals/IT band) with rolling stickand tennis ballto reduce tightness and discomfort; also performed soft tissue massage along quadratus lumborum to reduce tightness x132m total; PT also performed ice massage to gluteal soft tissue x2 min each side to reduce inflammation and improve tissue extensibility; Patient tolerated well with less tightness and less pain at end of session; She was able to exhibit better tissue mobility and less tenderness to palpation; She did have some discomfort with ice massage; Will assess tolerance to ice next session;   Sidelying: Clamshellsyellow tband 2x12reps bilaterally with min Vcs for positioning to improve hip strengthening;   Patient tolerated session well; She did have some discomfort with weight bearing tasks but reports less pain at rest;                   PT Education - 01/01/18 1440    Education provided  Yes    Education Details  HEP reinforced, LE strengthening, stretches;     Person(s) Educated  Patient    Methods   Explanation;Demonstration;Verbal cues    Comprehension  Verbalized understanding;Returned demonstration;Verbal cues required;Need further instruction       PT Short Term Goals - 12/29/17 1436      PT SHORT TERM GOAL #1   Title  Patient will reduce modified Oswestry score to <40% as to demonstrate less disability with ADLs including improved sleeping tolerance, walking/sitting tolerance etc for better mobility with ADLs.     Time  4    Period  Weeks    Status  Achieved    Target Date  12/10/17      PT SHORT TERM GOAL #2   Title  Patient will increase BLE gross hip strength to  4/5 as to improve functional strength for independent gait, increased standing tolerance and increased ADL ability.    Time  4    Period  Weeks    Status  Partially Met    Target Date  12/10/17      PT SHORT TERM GOAL #3   Title  Patient (> 20 years old) will complete five times sit to stand test in < 15 seconds indicating an increased LE strength and improved balance.    Time  4    Period  Weeks    Status  Achieved    Target Date  12/10/17        PT Long Term Goals - 12/29/17 1438      PT LONG TERM GOAL #1   Title  Patient will be independent in home exercise program to improve strength/mobility for better functional independence with ADLs.    Time  12    Period  Weeks    Status  On-going    Target Date  02/04/18      PT LONG TERM GOAL #2   Title  Patient will tolerate 5 seconds of single leg stance without loss of balance to improve ability to get in and out of shower safely.    Time  12    Period  Weeks    Status  Achieved    Target Date  02/04/18      PT LONG TERM GOAL #3   Title  Patient will increase BLE gross strength to 4+/5 as to improve functional strength for independent gait, increased standing tolerance and increased ADL ability.    Time  12    Period  Weeks    Status  Partially Met    Target Date  02/04/18      PT LONG TERM GOAL #4   Title  Patient will report a worst pain of  3/10 on VAS in low back            to improve tolerance with ADLs and reduced symptoms with activities.     Time  12    Period  Weeks    Status  Partially Met    Target Date  02/04/18      PT LONG TERM GOAL #5   Title  Patient will reduce modified Oswestry score to <20 as to demonstrate minimal disability with ADLs including improved sleeping tolerance, walking/sitting tolerance etc for better mobility with ADLs.     Time  12    Period  Weeks    Status  Partially Met    Target Date  02/04/18            Plan - 01/01/18 1608    Clinical Impression Statement  Patient instructed in advanced LE strengthening exercise. Progressed strengthening to increase resistance and repetition. Patient able to exhibit better hip control with less trendelenburg in stance with hip abductor exercise, but does report increased fatigue with red tband. PT applied ice massage to gluteals to reduce delayed onset muscle soreness x2 min; Patient typically doesn't tolerate cold well but has been very active lately and is having more soreness in hip soft tissue. Patient reports less soreness at end of session; She would benefit from additional skilled PT intervention to improve strength, balance and gait safety;     Rehab Potential  Good    Clinical Impairments Affecting Rehab Potential  motivated, recent onset of deficits, negative: chronic pain;     PT Frequency  2x / week  PT Duration  12 weeks    PT Treatment/Interventions  Aquatic Therapy;Cryotherapy;Electrical Stimulation;Moist Heat;DME Instruction;Gait training;Stair training;Functional mobility training;Therapeutic activities;Therapeutic exercise;Manual techniques;Patient/family education;Neuromuscular re-education;Balance training;Passive range of motion;Energy conservation    PT Next Visit Plan  advance HEP    PT Home Exercise Plan  continue as given;     Consulted and Agree with Plan of Care  Patient       Patient will benefit from skilled therapeutic  intervention in order to improve the following deficits and impairments:  Abnormal gait, Decreased endurance, Hypomobility, Decreased activity tolerance, Decreased knowledge of use of DME, Decreased strength, Pain, Increased fascial restricitons, Increased muscle spasms, Difficulty walking, Decreased mobility, Decreased balance, Decreased range of motion, Improper body mechanics, Postural dysfunction, Impaired flexibility  Visit Diagnosis: Chronic midline low back pain without sciatica  Abnormal posture  Difficulty in walking, not elsewhere classified  Muscle weakness (generalized)     Problem List Patient Active Problem List   Diagnosis Date Noted  . Chronic UTI 12/31/2017  . IBS (irritable bowel syndrome) 12/29/2017  . History of breast cancer 12/29/2017  . Hypothyroidism 12/29/2017  . Baker's cyst of knee, left 12/29/2017  . HTN (hypertension) 12/29/2017  . HLD (hyperlipidemia) 12/29/2017  . Nausea 12/29/2017  . Abdominal pain 12/29/2017    Trotter,Margaret PT, DPT 01/01/2018, 4:10 PM  [place] MAIN [facility] SERVICES [address] [place], [place], [address] Phone: [phone]   Fax:  [phone]  Name: Maria Spencer MRN: [id number] Date of Birth: [birth year]

## 2018-01-05 ENCOUNTER — Encounter: Payer: Self-pay | Admitting: Physical Therapy

## 2018-01-05 ENCOUNTER — Ambulatory Visit: Payer: Medicare Other | Admitting: Physical Therapy

## 2018-01-05 DIAGNOSIS — G8929 Other chronic pain: Secondary | ICD-10-CM | POA: Diagnosis not present

## 2018-01-05 DIAGNOSIS — M6281 Muscle weakness (generalized): Secondary | ICD-10-CM

## 2018-01-05 DIAGNOSIS — R293 Abnormal posture: Secondary | ICD-10-CM

## 2018-01-05 DIAGNOSIS — M545 Low back pain, unspecified: Secondary | ICD-10-CM

## 2018-01-05 DIAGNOSIS — R262 Difficulty in walking, not elsewhere classified: Secondary | ICD-10-CM | POA: Diagnosis not present

## 2018-01-05 NOTE — Therapy (Signed)
[place] MAIN [facility] SERVICES [address] [place], [place], [address] Phone: [phone]   Fax:  [phone]  Physical Therapy Treatment  Patient Details  Name: Maria Spencer MRN: [id number] Date of Birth: [birth year] Referring Provider: Dr. Glade Nurse   Encounter Date: 01/05/2018  PT End of Session - 01/05/18 1441    Visit Number  11    Number of Visits  25    Date for PT Re-Evaluation  02/04/18    PT Start Time  2836    PT Stop Time  1515    PT Time Calculation (min)  43 min    Activity Tolerance  Patient tolerated treatment well;No increased pain    Behavior During Therapy  WFL for tasks assessed/performed       Past Medical History:  Diagnosis Date  . Arthritis    neck, back, left knee;   . Breast cancer [facility]) 2009   right lumpectomy s/p radiation   . Chicken pox   . Chronic UTI    established with urology   . Diverticular disease    -osis and -itis   . GERD (gastroesophageal reflux disease)   . Hormone disorder   . Hyperlipidemia   . Hypertension    controlled well with medication;   . Thyroid disease    hypothyroidism     Past Surgical History:  Procedure Laterality Date  . BONE GRAFT HIP ILIAC CREST     + cage left hip 10/23/17   . BREAST LUMPECTOMY     2009 lumpectomy   . SHOULDER SURGERY     x2 surgeries both shoulders, right shoulder replacement last in 2012   . SPINE SURGERY     spinal 09/2017 h/o scoliosis   . TONSILLECTOMY     age 42 y.o.   . TOTAL SHOULDER REPLACEMENT      There were no vitals filed for this visit.  Subjective Assessment - 01/05/18 1439    Subjective  Patient reports increased stiffness in low back today; She denies any severe pain; She reports a lot of soreness after last session but states that it was better the next day ;     Pertinent History  72 yo Female s/p scoliosis surgery with rod/screw placement from T10 to illium; Patient reports having increased pain in back since  surgery; Her surgery was on 10/20/17 and on 10/23/17 she had a bone graft; Patient went and got stitches removed on 11/10/17; Patient goes back for a follow up on Januray 15; She presents to therapy with walking stick; Patient reports using walking stick most of the time; However at home she can walk to bedroom/bathroom without the walking stick; She reports needing help with some ADLs including difficulty cleaning self, lower body dressing; denies any recent falls; She reports having some numbness in anterior left hip which the nurse practioner believes is related to bone graft; She reports having numbness in BLE prior to surgery but since surgery she is having numbness on anterior left leg which limits her ability to lift leg; Patient reports approximately 4 sleep disturbances per night related to back pain;     How long can you sit comfortably?  30 min;     How long can you stand comfortably?  10-15 min;     How long can you walk comfortably?  about 300-500 feet;     Diagnostic tests  Did x-rays which looked good after rod placement;     Patient Stated Goals  "To  reduce pain and improve strength/mobility"    Currently in Pain?  Yes    Pain Score  2     Pain Location  Back    Pain Orientation  Lower    Pain Descriptors / Indicators  Aching;Sore    Pain Type  Acute pain;Surgical pain    Pain Onset  1 to 4 weeks ago    Pain Frequency  Intermittent    Aggravating Factors   worse with prolonged standing/walkin    Pain Relieving Factors  pain meds    Effect of Pain on Daily Activities  decreased activity tolerance;     Multiple Pain Sites  No         TREATMENT: Warm up on Nustep BUE/BLE level2x69mn (unbilled)  Sitting: Diaphragmatic breathing with lower pelvic posterior/anterior rotation to tolerance; Required min Vcs for correct technique and positioning x10reps; Alternate UE lift 2# x10 bilaterally with cues for core stabilization and to avoid painful ROM for better lumbar  support;  Green tband around BLE: Hip flexion march x15 bilaterally; Hip abduction x15 bilaterally; Patient required min-moderate verbal/tactile cues for correct exercise technique including cues to improve core stabilization with LE lift;  Standing: Red tband around BLE: Hip abductionx10reps bilaterally with min A and mod VCs to improve hip positioning for better hip strengthening and stabilization; Patient had significant difficulty lifting LLE due to weakness; Hip extensionx10reps bilaterally with minimal difficulty; Side stepping x10 feet x2 laps each direction with cues to improve hip abduction, larger step out, for better strengthening;   Standing: LLE hip elevation to challenge RLE hip abduction positionx10 reps LLE hip elevation with LLE stepping over and back 1/2 bolster to challenge RLE hip abduction x10 reps;    PT performed soft tissue massage to right/leftposterior hip and lateral LE (gluteals/IT band) with rolling stickto reduce tightness and discomfort; also performed soft tissue massage along quadratus lumborum to reduce tightnessx112m total;  Patient tolerated well with less tightness and less pain at end of session; She was able to exhibit better tissue mobility and less tenderness to palpation;  Sidelying: Clamshellsyellow tband 2x12reps bilaterally with min Vcs for positioning to improve hip strengthening;  Hooklying: With pball under LEs: -posterior pelvic rock with diaphragmatic breathing x10 reps; with cues to avoid lifting up for less lumbar extension;  -pball double knee to chest to improve lumbar flexion stretch x10 reps;   Patient tolerated session well; reports less stiffness and no pain at end of session;                     PT Education - 01/05/18 1441    Education provided  Yes    Education Details  HEP reinforced, LE strengthening, stretches;     Person(s) Educated  Patient    Methods   Explanation;Demonstration;Verbal cues    Comprehension  Verbalized understanding;Returned demonstration;Verbal cues required;Need further instruction       PT Short Term Goals - 12/29/17 1436      PT SHORT TERM GOAL #1   Title  Patient will reduce modified Oswestry score to <40% as to demonstrate less disability with ADLs including improved sleeping tolerance, walking/sitting tolerance etc for better mobility with ADLs.     Time  4    Period  Weeks    Status  Achieved    Target Date  12/10/17      PT SHORT TERM GOAL #2   Title  Patient will increase BLE gross hip strength to 4/5 as to improve  functional strength for independent gait, increased standing tolerance and increased ADL ability.    Time  4    Period  Weeks    Status  Partially Met    Target Date  12/10/17      PT SHORT TERM GOAL #3   Title  Patient (> 64 years old) will complete five times sit to stand test in < 15 seconds indicating an increased LE strength and improved balance.    Time  4    Period  Weeks    Status  Achieved    Target Date  12/10/17        PT Long Term Goals - 12/29/17 1438      PT LONG TERM GOAL #1   Title  Patient will be independent in home exercise program to improve strength/mobility for better functional independence with ADLs.    Time  12    Period  Weeks    Status  On-going    Target Date  02/04/18      PT LONG TERM GOAL #2   Title  Patient will tolerate 5 seconds of single leg stance without loss of balance to improve ability to get in and out of shower safely.    Time  12    Period  Weeks    Status  Achieved    Target Date  02/04/18      PT LONG TERM GOAL #3   Title  Patient will increase BLE gross strength to 4+/5 as to improve functional strength for independent gait, increased standing tolerance and increased ADL ability.    Time  12    Period  Weeks    Status  Partially Met    Target Date  02/04/18      PT LONG TERM GOAL #4   Title  Patient will report a worst pain of  3/10 on VAS in low back            to improve tolerance with ADLs and reduced symptoms with activities.     Time  12    Period  Weeks    Status  Partially Met    Target Date  02/04/18      PT LONG TERM GOAL #5   Title  Patient will reduce modified Oswestry score to <20 as to demonstrate minimal disability with ADLs including improved sleeping tolerance, walking/sitting tolerance etc for better mobility with ADLs.     Time  12    Period  Weeks    Status  Partially Met    Target Date  02/04/18            Plan - 01/05/18 1518    Clinical Impression Statement  Patient instructed in advanced LE strengthening exercise. Patient able to tolerate better strengthening with improved hip control and less trendelenburg. She does report continued soreness with prolonged SLS. However when walking she exhibits better hip control and less trendelenburg as compared to a few weeks ago. She would benefit from additional skilled PT intervention to improve strength, core abdominal control and reduce back pain;     Rehab Potential  Good    Clinical Impairments Affecting Rehab Potential  motivated, recent onset of deficits, negative: chronic pain;     PT Frequency  2x / week    PT Duration  12 weeks    PT Treatment/Interventions  Aquatic Therapy;Cryotherapy;Electrical Stimulation;Moist Heat;DME Instruction;Gait training;Stair training;Functional mobility training;Therapeutic activities;Therapeutic exercise;Manual techniques;Patient/family education;Neuromuscular re-education;Balance training;Passive range of motion;Energy conservation    PT  Next Visit Plan  advance HEP    PT Home Exercise Plan  continue as given;     Consulted and Agree with Plan of Care  Patient       Patient will benefit from skilled therapeutic intervention in order to improve the following deficits and impairments:  Abnormal gait, Decreased endurance, Hypomobility, Decreased activity tolerance, Decreased knowledge of use of DME,  Decreased strength, Pain, Increased fascial restricitons, Increased muscle spasms, Difficulty walking, Decreased mobility, Decreased balance, Decreased range of motion, Improper body mechanics, Postural dysfunction, Impaired flexibility  Visit Diagnosis: Chronic midline low back pain without sciatica  Abnormal posture  Difficulty in walking, not elsewhere classified  Muscle weakness (generalized)     Problem List Patient Active Problem List   Diagnosis Date Noted  . Chronic UTI 12/31/2017  . IBS (irritable bowel syndrome) 12/29/2017  . History of breast cancer 12/29/2017  . Hypothyroidism 12/29/2017  . Baker's cyst of knee, left 12/29/2017  . HTN (hypertension) 12/29/2017  . HLD (hyperlipidemia) 12/29/2017  . Nausea 12/29/2017  . Abdominal pain 12/29/2017     Trotter,Margaret PT, DPT 01/05/2018, 4:09 PM  [place] MAIN [facility] SERVICES [address] [place], [place], [address] Phone: [phone]   Fax:  [phone]  Name: Maria Spencer MRN: [id number] Date of Birth: [birth year]

## 2018-01-07 ENCOUNTER — Ambulatory Visit (INDEPENDENT_AMBULATORY_CARE_PROVIDER_SITE_OTHER): Payer: Medicare Other | Admitting: Gastroenterology

## 2018-01-07 ENCOUNTER — Other Ambulatory Visit
Admission: RE | Admit: 2018-01-07 | Discharge: 2018-01-07 | Disposition: A | Discharge status: Home / Self Care | Payer: Medicare Other | Source: Ambulatory Visit | Attending: Gastroenterology | Admitting: Gastroenterology

## 2018-01-07 ENCOUNTER — Encounter: Payer: Self-pay | Admitting: Gastroenterology

## 2018-01-07 VITALS — BP 133/78 | HR 98 | Temp 98.2°F | Ht 59.0 in | Wt 133.2 lb

## 2018-01-07 DIAGNOSIS — D649 Anemia, unspecified: Secondary | ICD-10-CM | POA: Diagnosis not present

## 2018-01-07 DIAGNOSIS — C50919 Malignant neoplasm of unspecified site of unspecified female breast: Secondary | ICD-10-CM | POA: Insufficient documentation

## 2018-01-07 DIAGNOSIS — D519 Vitamin B12 deficiency anemia, unspecified: Secondary | ICD-10-CM

## 2018-01-07 DIAGNOSIS — G43909 Migraine, unspecified, not intractable, without status migrainosus: Secondary | ICD-10-CM | POA: Insufficient documentation

## 2018-01-07 DIAGNOSIS — K219 Gastro-esophageal reflux disease without esophagitis: Secondary | ICD-10-CM | POA: Diagnosis not present

## 2018-01-07 DIAGNOSIS — K589 Irritable bowel syndrome without diarrhea: Secondary | ICD-10-CM | POA: Diagnosis not present

## 2018-01-07 DIAGNOSIS — M8580 Other specified disorders of bone density and structure, unspecified site: Secondary | ICD-10-CM | POA: Insufficient documentation

## 2018-01-07 DIAGNOSIS — R110 Nausea: Secondary | ICD-10-CM | POA: Diagnosis not present

## 2018-01-07 DIAGNOSIS — M1990 Unspecified osteoarthritis, unspecified site: Secondary | ICD-10-CM | POA: Insufficient documentation

## 2018-01-07 DIAGNOSIS — G4700 Insomnia, unspecified: Secondary | ICD-10-CM | POA: Insufficient documentation

## 2018-01-07 DIAGNOSIS — D509 Iron deficiency anemia, unspecified: Secondary | ICD-10-CM

## 2018-01-07 LAB — IRON AND TIBC
Iron: 24 ug/dL — ABNORMAL LOW (ref 28–170)
Saturation Ratios: 7 % — ABNORMAL LOW (ref 10.4–31.8)
TIBC: 326 ug/dL (ref 250–450)
UIBC: 302 ug/dL

## 2018-01-07 LAB — VITAMIN B12: Vitamin B-12: 261 pg/mL (ref 180–914)

## 2018-01-07 LAB — FERRITIN: Ferritin: 31 ng/mL (ref 11–307)

## 2018-01-07 MED ORDER — AMITRIPTYLINE HCL 25 MG PO TABS: 25.0000 mg | ORAL_TABLET | Freq: Every day | ORAL | 1 refills | Status: DC

## 2018-01-07 MED ORDER — DICYCLOMINE HCL 10 MG PO CAPS: 10.0000 mg | ORAL_CAPSULE | Freq: Three times a day (TID) | ORAL | 0 refills | Status: DC

## 2018-01-07 MED ORDER — DICYCLOMINE HCL 10 MG PO CAPS: 10.0000 mg | ORAL_CAPSULE | Freq: Three times a day (TID) | ORAL | 0 refills | Status: DC | PRN

## 2018-01-07 NOTE — Progress Notes (Signed)
Cephas Darby, MD [address]  [place]  [place], [place] [address]  Main: [phone]  Fax: [phone]    Gastroenterology Consultation  Referring Provider:     McLean-Scocuzza, Olivia Mackie * Primary Care Physician:  McLean-Scocuzza, Nino Glow, MD Primary Gastroenterologist:  Dr. Cephas Darby Reason for Consultation:     IBS        HPI:   Maria Spencer is a 72 y.o. female referred by Dr. Terese Door, Nino Glow, MD  for consultation & management of IBS. Patient reports having long-standing history of irritable bowel syndrome. She underwent back surgery in November since then she has been experiencing worsening of her IBS symptoms including abdominal cramps, severe gas/bloating which is mostly postprandial, cramps relieved after having a bowel movement. She reports having about 2 bowel movements daily without any blood. She does have bowel movement at night it is associated with straining, usually with flatus, although stools are not hard. She reports that she has been stressed recently with back surgery and also her husband with new diagnosis of metastatic bladder bladder cancer. She became tearful talking about her husband and about the stress. She reports that she tried probiotics which did not help. She tries to eat healthy, avoids red meat, carbonated beverages. She avoids gas producing foods as well. She also reports that she had an attack of diverticulitis last year and was given antibiotics. Her weight has been overall stable. Her hemoglobin was mildly low on most recent labs, normal MCV. She denies nausea, vomiting but reports loss of appetite and early satiety. She denies recent travel or antibiotic use or viral illness  NSAIDs: none  Antiplts/Anticoagulants/Anti thrombotics: none  GI Procedures: she reports having had a colonoscopy in [place] in 2015, was told that she has diverticulosis. Report not available. She was told that there was also stool in her  colon.  Past Medical History:  Diagnosis Date  . Arthritis    neck, back, left knee;   . Breast cancer [facility]) 2009   right lumpectomy s/p radiation   . Chicken pox   . Chronic UTI    established with urology   . Diverticular disease    -osis and -itis   . GERD (gastroesophageal reflux disease)   . Hormone disorder   . Hyperlipidemia   . Hypertension    controlled well with medication;   . Thyroid disease    hypothyroidism     Past Surgical History:  Procedure Laterality Date  . BONE GRAFT HIP ILIAC CREST     + cage left hip 10/23/17   . BREAST LUMPECTOMY     2009 lumpectomy   . SHOULDER SURGERY     x2 surgeries both shoulders, right shoulder replacement last in 2012   . SPINE SURGERY     spinal 09/2017 h/o scoliosis   . TONSILLECTOMY     age 58 y.o.   . TOTAL SHOULDER REPLACEMENT      Prior to Admission medications   Medication Sig Start Date End Date Taking? Authorizing Provider  atorvastatin (LIPITOR) 20 MG tablet Take 0.5 tablets (10 mg total) by mouth daily at 6 PM. 12/29/17  Yes McLean-Scocuzza, Nino Glow, MD  levothyroxine (SYNTHROID, LEVOTHROID) 88 MCG tablet Take 1 tablet (88 mcg total) by mouth daily before breakfast. 12/29/17  Yes McLean-Scocuzza, Nino Glow, MD  lisinopril (PRINIVIL,ZESTRIL) 20 MG tablet Take 1 tablet (20 mg total) by mouth daily. 12/29/17  Yes McLean-Scocuzza, Nino Glow, MD  Multiple Vitamin (MULTI-VITAMINS) TABS Take by  mouth.   Yes [provider]  nitrofurantoin, macrocrystal-monohydrate, (MACROBID) 100 MG capsule Take 1 capsule (100 mg total) daily by mouth. 09/29/17  Yes MacDiarmid, Nicki Reaper, MD  zolpidem (AMBIEN) 5 MG tablet Take by mouth.    [provider]    Family History  Problem Relation Age of Onset  . Arthritis Mother   . Heart disease Mother   . Hyperlipidemia Mother   . Hypertension Mother   . Arthritis Father   . Diabetes Father   . Cancer Father        colon  . Arthritis Sister   . Hyperlipidemia Sister   .  Hypertension Sister   . Cancer Brother        lung, smoker  . Mental retardation Brother   . Drug abuse Daughter   . Arthritis Sister   . Hyperlipidemia Sister   . Bladder Cancer Neg Hx   . Kidney cancer Neg Hx      Social History   Tobacco Use  . Smoking status: Never Smoker  . Smokeless tobacco: Never Used  Substance Use Topics  . Alcohol use: Yes  . Drug use: No    Allergies as of 01/07/2018 - Review Complete 01/07/2018  Allergen Reaction Noted  . Sulfa antibiotics Itching 06/13/2015    Review of Systems:    All systems reviewed and negative except where noted in HPI.   Physical Exam:  BP 133/78   Pulse 98   Temp 98.2 F (36.8 C) (Oral)   Ht 4\' 11"  (1.499 m)   Wt 133 lb 3.2 oz (60.4 kg)   BMI 26.90 kg/m  No LMP recorded. Patient is postmenopausal.  General:   Alert,  Well-developed, well-nourished, pleasant and cooperative in NAD Head:  Normocephalic and atraumatic. Eyes:  Sclera clear, no icterus.   Conjunctiva pink. Ears:  Normal auditory acuity. Nose:  No deformity, discharge, or lesions. Mouth:  No deformity or lesions,oropharynx pink & moist. Neck:  Supple; no masses or thyromegaly. Lungs:  Respirations even and unlabored.  Clear throughout to auscultation.   No wheezes, crackles, or rhonchi. No acute distress. Heart:  Regular rate and rhythm; no murmurs, clicks, rubs, or gallops. Abdomen:  Normal bowel sounds. Soft, mild suprapubic tenderness and non-distended without masses, hepatosplenomegaly or hernias noted.  No guarding or rebound tenderness.   Rectal: Not performed Msk:  Symmetrical without gross deformities. Good, equal movement & strength bilaterally. Pulses:  Normal pulses noted. Extremities:  No clubbing or edema.  No cyanosis. Neurologic:  Alert and oriented x3;  grossly normal neurologically. Skin:  Intact without significant lesions or rashes. No jaundice. Lymph Nodes:  No significant cervical adenopathy. Psych:  Alert and cooperative.  Normal mood and affect.  Imaging Studies: No abdominal imaging  Assessment and Plan:   Maria Spencer is a 72 y.o. female with long-standing history of IBS, recent back surgery presents with abdominal cramps, severe gas/bloating since 09/2017 after back surgery associated with loss of appetite, early satiety and weight loss. She is also going through tremendous stress with her husband's medical condition. She did have a colonoscopy in 2015 which revealed diverticulosis. However, report is not available. We will try to obtain the colonoscopy report. I suspect that she is going to flare up of IBS given significant amount of stress in her life.    Abdominal cramps and bloating: - Recommend amitriptyline 25 mg at bedtime - Recommend trial of IB guard - Recommend dicyclomine 10 mg every 6 hours as needed for abdominal  cramps - low threshold to repeat colonoscopy and perform EGD if her symptoms persist for next 1-2 months despite above measures  Normocytic anemia: Her hemoglobin levels are improving within last 2 months - Check ferritin, B12, iron, TIBC   Follow up in 4 weeks   Cephas Darby, MD

## 2018-01-08 ENCOUNTER — Ambulatory Visit: Payer: Medicare Other | Admitting: Physical Therapy

## 2018-01-08 ENCOUNTER — Encounter: Payer: Self-pay | Admitting: Physical Therapy

## 2018-01-08 DIAGNOSIS — M545 Low back pain, unspecified: Secondary | ICD-10-CM

## 2018-01-08 DIAGNOSIS — M6281 Muscle weakness (generalized): Secondary | ICD-10-CM

## 2018-01-08 DIAGNOSIS — G8929 Other chronic pain: Secondary | ICD-10-CM | POA: Diagnosis not present

## 2018-01-08 DIAGNOSIS — R293 Abnormal posture: Secondary | ICD-10-CM | POA: Diagnosis not present

## 2018-01-08 DIAGNOSIS — R262 Difficulty in walking, not elsewhere classified: Secondary | ICD-10-CM

## 2018-01-08 NOTE — Therapy (Signed)
[place] MAIN [facility] SERVICES [address] [place], [place], [address] Phone: [phone]   Fax:  [phone]  Physical Therapy Treatment  Patient Details  Name: Maria Spencer MRN: [id number] Date of Birth: Jun 06, 1946 Referring Provider: Dr. Glade Nurse   Encounter Date: 01/08/2018  PT End of Session - 01/08/18 1443    Visit Number  12    Number of Visits  25    Date for PT Re-Evaluation  02/04/18    PT Start Time  7893    PT Stop Time  1515    PT Time Calculation (min)  43 min    Activity Tolerance  Patient tolerated treatment well;No increased pain    Behavior During Therapy  WFL for tasks assessed/performed       Past Medical History:  Diagnosis Date  . Arthritis    neck, back, left knee;   . Breast cancer [facility]) 2009   right lumpectomy s/p radiation   . Chicken pox   . Chronic UTI    established with urology   . Diverticular disease    -osis and -itis   . GERD (gastroesophageal reflux disease)   . Hormone disorder   . Hyperlipidemia   . Hypertension    controlled well with medication;   . Thyroid disease    hypothyroidism     Past Surgical History:  Procedure Laterality Date  . BONE GRAFT HIP ILIAC CREST     + cage left hip 10/23/17   . BREAST LUMPECTOMY     2009 lumpectomy   . SHOULDER SURGERY     x2 surgeries both shoulders, right shoulder replacement last in 2012   . SPINE SURGERY     spinal 09/2017 h/o scoliosis   . TONSILLECTOMY     age 72 y.o.   . TOTAL SHOULDER REPLACEMENT      There were no vitals filed for this visit.  Subjective Assessment - 01/08/18 1441    Subjective  Patient reports walking 3/4 of a mile today; She reports doing better with walking and has been exercising more. She does feel increased stiffness today but denies any pain;     Pertinent History  72 yo Female s/p scoliosis surgery with rod/screw placement from T10 to illium; Patient reports having increased pain in back since surgery;  Her surgery was on 10/20/17 and on 10/23/17 she had a bone graft; Patient went and got stitches removed on 11/10/17; Patient goes back for a follow up on Januray 15; She presents to therapy with walking stick; Patient reports using walking stick most of the time; However at home she can walk to bedroom/bathroom without the walking stick; She reports needing help with some ADLs including difficulty cleaning self, lower body dressing; denies any recent falls; She reports having some numbness in anterior left hip which the nurse practioner believes is related to bone graft; She reports having numbness in BLE prior to surgery but since surgery she is having numbness on anterior left leg which limits her ability to lift leg; Patient reports approximately 4 sleep disturbances per night related to back pain;     How long can you sit comfortably?  30 min;     How long can you stand comfortably?  10-15 min;     How long can you walk comfortably?  about 300-500 feet;     Diagnostic tests  Did x-rays which looked good after rod placement;     Patient Stated Goals  "To reduce pain and  improve strength/mobility"    Currently in Pain?  No/denies         TREATMENT: Warm up on Nustep BUE/BLE level2x4mn (unbilled)  Sitting on pball: Gentle bounce x1 min with cues to improve posture for better core stabilization; Posterior/anterior pelvic rocks x10 reps; Side/side rocks x10 reps with cues to avoid painful ROM; Alternate march x10 bilaterally with cues to improve core stabilization for better postural control and balance on pball;  Standing: Redtband around BLE: Hip abductionx12reps bilaterally with min A and mod VCs to improve hip positioning for better hip strengthening and stabilization; Patient had significant difficulty lifting LLE due to weakness; Hip extensionx12reps bilaterally with minimal difficulty; Side stepping x10 feet x2 laps each direction with cues to improve hip abduction, larger  step out, for better strengthening;   Standing: LLE hip elevation with LLE stepping to airex pad, to challenge RLE hip abduction x10 reps;    PT performed soft tissue massage to right/leftposterior hip and lateral LE (gluteals/IT band) with rolling stickto reduce tightness and discomfort; also performed soft tissue massage along quadratus lumborum to reduce tightnessx154m total; Patient tolerated well with less tightness and less pain at end of session; She was able to exhibit better tissue mobility and less tenderness to palpation;  Sidelying: Clamshellsred tband 2x10 reps bilaterally with min Vcs for positioning to improve hip strengthening;  Hooklying: With pball under LEs: -posterior pelvic rock with diaphragmatic breathing x10 reps; with cues to avoid lifting up for less lumbar extension;  -pball double knee to chest to improve lumbar flexion stretch x10 reps;  -Posterior pelvic rocks x5 reps with cues for positioning to improve core abdominal stabilization;   Patient tolerated session well; reports less stiffness and no pain at end of session;                        PT Education - 01/08/18 1441    Education provided  Yes    Education Details  HEP reinforced, LE strengthening, stretches;     Person(s) Educated  Patient    Methods  Explanation;Demonstration;Verbal cues    Comprehension  Verbalized understanding;Returned demonstration;Verbal cues required;Need further instruction       PT Short Term Goals - 12/29/17 1436      PT SHORT TERM GOAL #1   Title  Patient will reduce modified Oswestry score to <40% as to demonstrate less disability with ADLs including improved sleeping tolerance, walking/sitting tolerance etc for better mobility with ADLs.     Time  4    Period  Weeks    Status  Achieved    Target Date  12/10/17      PT SHORT TERM GOAL #2   Title  Patient will increase BLE gross hip strength to 4/5 as to improve functional strength  for independent gait, increased standing tolerance and increased ADL ability.    Time  4    Period  Weeks    Status  Partially Met    Target Date  12/10/17      PT SHORT TERM GOAL #3   Title  Patient (> [age]ears old) will complete five times sit to stand test in < 15 seconds indicating an increased LE strength and improved balance.    Time  4    Period  Weeks    Status  Achieved    Target Date  12/10/17        PT Long Term Goals - 12/29/17 1438  PT LONG TERM GOAL #1   Title  Patient will be independent in home exercise program to improve strength/mobility for better functional independence with ADLs.    Time  12    Period  Weeks    Status  On-going    Target Date  02/04/18      PT LONG TERM GOAL #2   Title  Patient will tolerate 5 seconds of single leg stance without loss of balance to improve ability to get in and out of shower safely.    Time  12    Period  Weeks    Status  Achieved    Target Date  02/04/18      PT LONG TERM GOAL #3   Title  Patient will increase BLE gross strength to 4+/5 as to improve functional strength for independent gait, increased standing tolerance and increased ADL ability.    Time  12    Period  Weeks    Status  Partially Met    Target Date  02/04/18      PT LONG TERM GOAL #4   Title  Patient will report a worst pain of 3/10 on VAS in low back            to improve tolerance with ADLs and reduced symptoms with activities.     Time  12    Period  Weeks    Status  Partially Met    Target Date  02/04/18      PT LONG TERM GOAL #5   Title  Patient will reduce modified Oswestry score to <20 as to demonstrate minimal disability with ADLs including improved sleeping tolerance, walking/sitting tolerance etc for better mobility with ADLs.     Time  12    Period  Weeks    Status  Partially Met    Target Date  02/04/18            Plan - 01/08/18 1607    Clinical Impression Statement  Patient instructed in advanced core abdominal and LE  strengthening. She required cues for correct positioning and to improve weight shift for improved strength. Patient able to exhibit less trendelenburg with tband exercise but does fatigue quickly. Patient reports increased fatigue and soreness with pball exercise. She would benefit from additional skilled PT intervention to improve strength, and reduce back pain;     Rehab Potential  Good    Clinical Impairments Affecting Rehab Potential  motivated, recent onset of deficits, negative: chronic pain;     PT Frequency  2x / week    PT Duration  12 weeks    PT Treatment/Interventions  Aquatic Therapy;Cryotherapy;Electrical Stimulation;Moist Heat;DME Instruction;Gait training;Stair training;Functional mobility training;Therapeutic activities;Therapeutic exercise;Manual techniques;Patient/family education;Neuromuscular re-education;Balance training;Passive range of motion;Energy conservation    PT Next Visit Plan  advance HEP    PT Home Exercise Plan  continue as given;     Consulted and Agree with Plan of Care  Patient       Patient will benefit from skilled therapeutic intervention in order to improve the following deficits and impairments:  Abnormal gait, Decreased endurance, Hypomobility, Decreased activity tolerance, Decreased knowledge of use of DME, Decreased strength, Pain, Increased fascial restricitons, Increased muscle spasms, Difficulty walking, Decreased mobility, Decreased balance, Decreased range of motion, Improper body mechanics, Postural dysfunction, Impaired flexibility  Visit Diagnosis: Chronic midline low back pain without sciatica  Abnormal posture  Difficulty in walking, not elsewhere classified  Muscle weakness (generalized)     Problem List Patient Active  Problem List   Diagnosis Date Noted  . Breast cancer ([place]) 01/07/2018  . GERD (gastroesophageal reflux disease) 01/07/2018  . Insomnia 01/07/2018  . Migraine 01/07/2018  . Osteopenia 01/07/2018  . Arthritis  01/07/2018  . Chronic UTI 12/31/2017  . IBS (irritable bowel syndrome) 12/29/2017  . History of breast cancer 12/29/2017  . Hypothyroidism 12/29/2017  . Baker's cyst of knee, left 12/29/2017  . HTN (hypertension) 12/29/2017  . HLD (hyperlipidemia) 12/29/2017  . Nausea 12/29/2017  . Abdominal pain 12/29/2017  . Scoliosis of cervical region due to degenerative disease of spine in adult 09/18/2017  . Kidney stones 03/29/2015    Trotter,Margaret PT, DPT 01/08/2018, 4:13 PM  [place] MAIN [facility] SERVICES [address] [place], [place], [address] Phone: [phone]   Fax:  [phone]  Name: Maria Spencer MRN: [id number] Date of Birth: 1946-04-12

## 2018-01-12 ENCOUNTER — Other Ambulatory Visit: Payer: Self-pay | Admitting: Internal Medicine

## 2018-01-12 ENCOUNTER — Encounter: Payer: Self-pay | Admitting: Physical Therapy

## 2018-01-12 ENCOUNTER — Ambulatory Visit: Payer: Medicare Other | Admitting: Physical Therapy

## 2018-01-12 DIAGNOSIS — R262 Difficulty in walking, not elsewhere classified: Secondary | ICD-10-CM | POA: Diagnosis not present

## 2018-01-12 DIAGNOSIS — G8929 Other chronic pain: Secondary | ICD-10-CM

## 2018-01-12 DIAGNOSIS — M545 Low back pain, unspecified: Secondary | ICD-10-CM

## 2018-01-12 DIAGNOSIS — M6281 Muscle weakness (generalized): Secondary | ICD-10-CM

## 2018-01-12 DIAGNOSIS — R293 Abnormal posture: Secondary | ICD-10-CM | POA: Diagnosis not present

## 2018-01-12 NOTE — Progress Notes (Signed)
Reviewed prior records Dr. David Stall 02/07/14 cervical median branch nerve block.  01/28/14 occipital nerve block  H/o UTI  Last pap 12/01/12, last mammogram 04/11/17  H/o CT 12/06/16 with left diverticulitis + on CT ab/pelvis  Colonoscopy 05/17/14 severe diverticulosis desc. 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

## 2018-01-12 NOTE — Patient Instructions (Signed)
    Copyright  VHI. All rights reserved. Knee to Chest (Flexion)   Pull knee toward chest. Feel stretch in lower back or buttock area. Breathing deeply, Hold __15__ seconds. Repeat with other knee. Repeat _2-3___ times. Do _2-3___ sessions per day.  http://gt2.exer.us/225   Copyright  VHI. All rights reserved.   Lower Trunk Rotation Stretch  Lying on back with knees bent, Keeping back flat and feet together, rotate knees side to side slowly and in pain free range of motion.  Hold _2___ seconds. Repeat for 1-2 minutes. Do __1__ sets per session. Do __2-3__ sessions per day.  http://orth.exer.us/122

## 2018-01-12 NOTE — Therapy (Signed)
[place] [employer] MAIN [facility] SERVICES [address] [place], [place], [address] Phone: [phone]   Fax:  [phone]  Physical Therapy Treatment  Patient Details  Name: Maria Spencer MRN: [id number] Date of Birth: December 25, 1945 Referring Provider: Dr. Glade Nurse   Encounter Date: 01/12/2018  PT End of Session - 01/12/18 1434    Visit Number  13    Number of Visits  25    Date for PT Re-Evaluation  02/04/18    PT Start Time  0174    PT Stop Time  1515    PT Time Calculation (min)  43 min    Activity Tolerance  Patient tolerated treatment well;No increased pain    Behavior During Therapy  WFL for tasks assessed/performed       Past Medical History:  Diagnosis Date  . Arthritis    neck, back, left knee;   . Breast cancer [facility]) 2009   right lumpectomy s/p radiation   . Chicken pox   . Chronic UTI    established with urology   . Diverticular disease    -osis and -itis   . GERD (gastroesophageal reflux disease)   . Hormone disorder   . Hyperlipidemia   . Hypertension    controlled well with medication;   . Thyroid disease    hypothyroidism     Past Surgical History:  Procedure Laterality Date  . BONE GRAFT HIP ILIAC CREST     + cage left hip 10/23/17   . BREAST LUMPECTOMY     2009 lumpectomy   . SHOULDER SURGERY     x2 surgeries both shoulders, right shoulder replacement last in 2012   . SPINE SURGERY     spinal 09/2017 h/o scoliosis   . TONSILLECTOMY     age 28 y.o.   . TOTAL SHOULDER REPLACEMENT      There were no vitals filed for this visit.  Subjective Assessment - 01/12/18 1433    Subjective  Patient reports doing okay. She reports no back/hip pain but just stiffness. "It feels kind of numb." Patient reports walking 3/4 of a mile today;     Pertinent History  72 yo Female s/p scoliosis surgery with rod/screw placement from T10 to illium; Patient reports having increased pain in back since surgery; Her surgery was on 10/20/17  and on 10/23/17 she had a bone graft; Patient went and got stitches removed on 11/10/17; Patient goes back for a follow up on Januray 15; She presents to therapy with walking stick; Patient reports using walking stick most of the time; However at home she can walk to bedroom/bathroom without the walking stick; She reports needing help with some ADLs including difficulty cleaning self, lower body dressing; denies any recent falls; She reports having some numbness in anterior left hip which the nurse practioner believes is related to bone graft; She reports having numbness in BLE prior to surgery but since surgery she is having numbness on anterior left leg which limits her ability to lift leg; Patient reports approximately 4 sleep disturbances per night related to back pain;     How long can you sit comfortably?  30 min;     How long can you stand comfortably?  10-15 min;     How long can you walk comfortably?  about 300-500 feet;     Diagnostic tests  Did x-rays which looked good after rod placement;     Patient Stated Goals  "To reduce pain and improve strength/mobility"  Currently in Pain?  No/denies    Multiple Pain Sites  No           TREATMENT: Warm up on Nustep BUE/BLE level2x82mn (unbilled)  Sitting on pball: Gentle bounce 2x1 min with cues to improve posture for better core stabilization; Posterior/anterior pelvic rocks x10 reps; Side/side rocks x10 reps with cues to avoid painful ROM; Alternate march/UE lift combo x10 bilaterally with cues to improve core stabilization for better postural control and balance on pball; Alternate LE LAQ with ankle DF for hamstring stretch x10 reps bilaterally with cues to improve core stabilization for less rolling on pball;   Standing: Redtband around BLE: Hip abductionx12reps bilaterally with min A and mod VCs to improve hip positioning for better hip strengthening and stabilization; Patient had significant difficulty lifting LLE due to  weakness; Hip extensionx12reps bilaterally with minimal difficulty;able to exhibit less hip drop as compared to previous sessions;  Side stepping x10 feet x2 laps each direction with cues to improve hip abduction, larger step out, for better strengthening;   Standing; Stepping over 1/2 bolster unsupported forward/backward with cues to avoid RLE hip trendelenburg x10 reps each direction; requires supervision for safety;   Standing: LLE hip elevation, to challenge RLE hip abduction x15 reps;   PT performed soft tissue massage to right/leftposterior hip and lateral LE (gluteals/IT band) with rolling stickto reduce tightness and discomfort; also performed soft tissue massage along quadratus lumborum to reduce tightnessx126m total; Patient tolerated well with less tightness and less pain at end of session; She was able to exhibit better tissue mobility and less tenderness to palpation;  Sidelying: Clamshellsred tband 2x15 reps bilaterally with min Vcs for positioning to improve hip strengthening;  Hooklying: With pball under LEs: -lumbar trunk rotation x1 min each direction with cues to avoid painful ROM;  -pball double knee to chest to improve lumbar flexion stretch x10 reps;  Advanced HEP with hooklying stretches to reduce stiffness at night and in the morning;  Patient tolerated session well;reports less stiffness and no pain at end of session;                    PT Education - 01/12/18 1433    Education provided  Yes    Education Details  HEP reinforced, LE strengthening, core strengthening;     Person(s) Educated  Patient    Methods  Explanation;Demonstration;Verbal cues    Comprehension  Verbalized understanding;Returned demonstration;Verbal cues required;Need further instruction       PT Short Term Goals - 12/29/17 1436      PT SHORT TERM GOAL #1   Title  Patient will reduce modified Oswestry score to <40% as to demonstrate less disability  with ADLs including improved sleeping tolerance, walking/sitting tolerance etc for better mobility with ADLs.     Time  4    Period  Weeks    Status  Achieved    Target Date  12/10/17      PT SHORT TERM GOAL #2   Title  Patient will increase BLE gross hip strength to 4/5 as to improve functional strength for independent gait, increased standing tolerance and increased ADL ability.    Time  4    Period  Weeks    Status  Partially Met    Target Date  12/10/17      PT SHORT TERM GOAL #3   Title  Patient (> 6068ears old) will complete five times sit to stand test in < 15 seconds indicating an  increased LE strength and improved balance.    Time  4    Period  Weeks    Status  Achieved    Target Date  12/10/17        PT Long Term Goals - 12/29/17 1438      PT LONG TERM GOAL #1   Title  Patient will be independent in home exercise program to improve strength/mobility for better functional independence with ADLs.    Time  12    Period  Weeks    Status  On-going    Target Date  02/04/18      PT LONG TERM GOAL #2   Title  Patient will tolerate 5 seconds of single leg stance without loss of balance to improve ability to get in and out of shower safely.    Time  12    Period  Weeks    Status  Achieved    Target Date  02/04/18      PT LONG TERM GOAL #3   Title  Patient will increase BLE gross strength to 4+/5 as to improve functional strength for independent gait, increased standing tolerance and increased ADL ability.    Time  12    Period  Weeks    Status  Partially Met    Target Date  02/04/18      PT LONG TERM GOAL #4   Title  Patient will report a worst pain of 3/10 on VAS in low back            to improve tolerance with ADLs and reduced symptoms with activities.     Time  12    Period  Weeks    Status  Partially Met    Target Date  02/04/18      PT LONG TERM GOAL #5   Title  Patient will reduce modified Oswestry score to <20 as to demonstrate minimal disability with ADLs  including improved sleeping tolerance, walking/sitting tolerance etc for better mobility with ADLs.     Time  12    Period  Weeks    Status  Partially Met    Target Date  02/04/18            Plan - 01/12/18 1459    Clinical Impression Statement  Patient instructed in advanced core abdominal and LE strengthening. She required cues for correct positioning and to improve weight shift for improved strength. Patient able to exhibit less trendelenburg with tband exercise but does fatigue quickly. She required increased cues to improve core stabilization while sitting on pball for better sitting control;  Patient reports increased fatigue and soreness with pball exercise. She would benefit from additional skilled PT intervention to improve strength, and reduce back pain;     Rehab Potential  Good    Clinical Impairments Affecting Rehab Potential  motivated, recent onset of deficits, negative: chronic pain;     PT Frequency  2x / week    PT Duration  12 weeks    PT Treatment/Interventions  Aquatic Therapy;Cryotherapy;Electrical Stimulation;Moist Heat;DME Instruction;Gait training;Stair training;Functional mobility training;Therapeutic activities;Therapeutic exercise;Manual techniques;Patient/family education;Neuromuscular re-education;Balance training;Passive range of motion;Energy conservation    PT Next Visit Plan  advance HEP    PT Home Exercise Plan  continue as given;     Consulted and Agree with Plan of Care  Patient       Patient will benefit from skilled therapeutic intervention in order to improve the following deficits and impairments:  Abnormal gait, Decreased endurance, Hypomobility,  Decreased activity tolerance, Decreased knowledge of use of DME, Decreased strength, Pain, Increased fascial restricitons, Increased muscle spasms, Difficulty walking, Decreased mobility, Decreased balance, Decreased range of motion, Improper body mechanics, Postural dysfunction, Impaired  flexibility  Visit Diagnosis: Chronic midline low back pain without sciatica  Abnormal posture  Difficulty in walking, not elsewhere classified  Muscle weakness (generalized)     Problem List Patient Active Problem List   Diagnosis Date Noted  . Breast cancer ([place]) 01/07/2018  . GERD (gastroesophageal reflux disease) 01/07/2018  . Insomnia 01/07/2018  . Migraine 01/07/2018  . Osteopenia 01/07/2018  . Arthritis 01/07/2018  . Chronic UTI 12/31/2017  . IBS (irritable bowel syndrome) 12/29/2017  . History of breast cancer 12/29/2017  . Hypothyroidism 12/29/2017  . Baker's cyst of knee, left 12/29/2017  . HTN (hypertension) 12/29/2017  . HLD (hyperlipidemia) 12/29/2017  . Nausea 12/29/2017  . Abdominal pain 12/29/2017  . Scoliosis of cervical region due to degenerative disease of spine in adult 09/18/2017  . Kidney stones 03/29/2015    Giorgi Debruin PT, DPT 01/12/2018, 3:21 PM  [place] MAIN [facility] SERVICES [address] [place], [place], [address] Phone: [phone]   Fax:  [phone]  Name: Nimah Uphoff MRN: [id number] Date of Birth: [birth year]

## 2018-01-15 ENCOUNTER — Encounter: Payer: Medicare Other | Admitting: Physical Therapy

## 2018-01-19 ENCOUNTER — Ambulatory Visit: Payer: Medicare Other | Admitting: Physical Therapy

## 2018-01-19 ENCOUNTER — Encounter: Payer: Self-pay | Admitting: Physical Therapy

## 2018-01-19 DIAGNOSIS — M6281 Muscle weakness (generalized): Secondary | ICD-10-CM

## 2018-01-19 DIAGNOSIS — M545 Low back pain, unspecified: Secondary | ICD-10-CM

## 2018-01-19 DIAGNOSIS — R262 Difficulty in walking, not elsewhere classified: Secondary | ICD-10-CM

## 2018-01-19 DIAGNOSIS — G8929 Other chronic pain: Secondary | ICD-10-CM

## 2018-01-19 DIAGNOSIS — R293 Abnormal posture: Secondary | ICD-10-CM

## 2018-01-19 NOTE — Therapy (Signed)
[place] MAIN [facility] SERVICES [address] [place], [place], [address] Phone: [phone]   Fax:  [phone]  Physical Therapy Treatment/Progress Note  Patient Details  Name: Maria Spencer MRN: [id number] Date of Birth: October 08, 1946 Referring Provider: Dr. Glade Nurse   Encounter Date: 01/19/2018  PT End of Session - 01/19/18 1424    Visit Number  14    Number of Visits  25    Date for PT Re-Evaluation  02/04/18    PT Start Time  5638    PT Stop Time  1510    PT Time Calculation (min)  45 min    Activity Tolerance  Patient tolerated treatment well;No increased pain    Behavior During Therapy  WFL for tasks assessed/performed       Past Medical History:  Diagnosis Date  . Arthritis    neck, back, left knee;   . Breast cancer [facility]) 2009   right lumpectomy s/p radiation   . Chicken pox   . Chronic UTI    established with urology   . Diverticular disease    -osis and -itis   . GERD (gastroesophageal reflux disease)   . Hormone disorder   . Hyperlipidemia   . Hypertension    controlled well with medication;   . Thyroid disease    hypothyroidism     Past Surgical History:  Procedure Laterality Date  . BONE GRAFT HIP ILIAC CREST     + cage left hip 10/23/17   . BREAST LUMPECTOMY     2009 lumpectomy   . SHOULDER SURGERY     x2 surgeries both shoulders, right shoulder replacement last in 2012   . SPINE SURGERY     spinal 09/2017 h/o scoliosis   . TONSILLECTOMY     age 36 y.o.   . TOTAL SHOULDER REPLACEMENT      There were no vitals filed for this visit.  Subjective Assessment - 01/19/18 1423    Subjective  Patient reports increased stiffness today; She reports no back/hip pain but just stiffness. She reports that her husband is currently in the hospital with a wound infection. She has been busy trying to help care for him;     Pertinent History  72 yo Female s/p scoliosis surgery with rod/screw placement from T10 to illium;  Patient reports having increased pain in back since surgery; Her surgery was on 10/20/17 and on 10/23/17 she had a bone graft; Patient went and got stitches removed on 11/10/17; Patient goes back for a follow up on Januray 15; She presents to therapy with walking stick; Patient reports using walking stick most of the time; However at home she can walk to bedroom/bathroom without the walking stick; She reports needing help with some ADLs including difficulty cleaning self, lower body dressing; denies any recent falls; She reports having some numbness in anterior left hip which the nurse practioner believes is related to bone graft; She reports having numbness in BLE prior to surgery but since surgery she is having numbness on anterior left leg which limits her ability to lift leg; Patient reports approximately 4 sleep disturbances per night related to back pain;     How long can you sit comfortably?  30 min;     How long can you stand comfortably?  10-15 min;     How long can you walk comfortably?  about 300-500 feet;     Diagnostic tests  Did x-rays which looked good after rod placement;  Patient Stated Goals  "To reduce pain and improve strength/mobility"    Currently in Pain?  No/denies         [facility] PT Assessment - 01/19/18 0001      Observation/Other Assessments   Modified Oswertry  22% (moderate disability, improved from 12/29/17 which was 36%)      Strength   Right Hip Flexion  4/5    Right Hip ABduction  3+/5    Right Hip ADduction  3+/5    Left Hip Flexion  4/5    Left Hip ABduction  4-/5    Left Hip ADduction  4-/5    Right Knee Flexion  4+/5    Right Knee Extension  4+/5    Left Knee Flexion  4+/5    Left Knee Extension  4+/5    Right Ankle Dorsiflexion  4+/5    Right Ankle Plantar Flexion  4+/5        TREATMENT: Warm up on Nustep BUE/BLE level2x27mn (unbilled) PT assessed LE strength and Modified oswestry to address goals, see above;  Standing: Redtband around  BLE: Hip abduction2x10 LLE while standing on foam with RLE to facilitate better RLE hip control, mod VCs to improve hip positioning for better hip strengthening and stabilization; Patient had significant difficulty lifting LLE due to weakness; Hip extensionx10reps bilaterally with single leg on foam to facilitate better hip control and stabilization, with minimal difficulty;able to exhibit less hip drop as compared to previous sessions;  Side stepping x10 feet x2 laps each direction with cues to improve hip abduction, larger step out, for better strengthening;   Standing: LLE hip elevation, with toe tap to foamto challenge RLE hip abduction x15 reps;  Standing at tower: Single leg donkey kick, (gluteal max hip extension) 5# 2x10 bilaterally with chair to hold onto for balance and min VCs for positioning for better hip strengthening;  PT performed soft tissue massage to right/leftposterior hip and lateral LE (gluteals/IT band) with rolling stickto reduce tightness and discomfort; also performed soft tissue massage along quadratus lumborum to reduce tightnessx156m total; Patient tolerated well with less tightness and less pain at end of session; She was able to exhibit better tissue mobility and less tenderness to palpation;  Sidelying: Clamshellsred tband 2x15 reps bilaterally with min Vcs for positioning to improve hip strengthening;  Hooklying: Red tband hip abduction/ER RLE/LLE single leg x15 reps;  Patient tolerated session well;reports less stiffness and no pain at end of session;                     PT Education - 01/19/18 1423    Education provided  Yes    Education Details  HEP reinforced, LE strengthening, core strengthening;     Person(s) Educated  Patient    Methods  Explanation;Demonstration;Verbal cues    Comprehension  Verbalized understanding;Returned demonstration;Verbal cues required;Need further instruction       PT Short Term  Goals - 01/19/18 1429      PT SHORT TERM GOAL #1   Title  Patient will reduce modified Oswestry score to <40% as to demonstrate less disability with ADLs including improved sleeping tolerance, walking/sitting tolerance etc for better mobility with ADLs.     Time  4    Period  Weeks    Status  Achieved      PT SHORT TERM GOAL #2   Title  Patient will increase BLE gross hip strength to 4/5 as to improve functional strength for independent gait, increased standing tolerance  and increased ADL ability.    Time  4    Period  Weeks    Status  Partially Met    Target Date  12/10/17      PT SHORT TERM GOAL #3   Title  Patient (> 56 years old) will complete five times sit to stand test in < 15 seconds indicating an increased LE strength and improved balance.    Time  4    Period  Weeks    Status  Achieved        PT Long Term Goals - 01/19/18 1429      PT LONG TERM GOAL #1   Title  Patient will be independent in home exercise program to improve strength/mobility for better functional independence with ADLs.    Time  12    Period  Weeks    Status  On-going    Target Date  02/04/18      PT LONG TERM GOAL #2   Title  Patient will tolerate 5 seconds of single leg stance without loss of balance to improve ability to get in and out of shower safely.    Time  12    Period  Weeks    Status  Achieved      PT LONG TERM GOAL #3   Title  Patient will increase BLE gross strength to 4+/5 as to improve functional strength for independent gait, increased standing tolerance and increased ADL ability.    Time  12    Period  Weeks    Status  Partially Met    Target Date  02/04/18      PT LONG TERM GOAL #4   Title  Patient will report a worst pain of 3/10 on VAS in low back            to improve tolerance with ADLs and reduced symptoms with activities.     Baseline  2/10 worse pain;     Time  12    Period  Weeks    Status  Achieved      PT LONG TERM GOAL #5   Title  Patient will reduce  modified Oswestry score to <20 as to demonstrate minimal disability with ADLs including improved sleeping tolerance, walking/sitting tolerance etc for better mobility with ADLs.     Time  12    Period  Weeks    Status  Partially Met    Target Date  02/04/18            Plan - 01/19/18 1509    Clinical Impression Statement  Patient exhibits improved LE strength and mobility. However she continues to have weakness in hip abductors/adductors which limits postural control and hip control in standing. Patient continues to have trendelenburg gait due to hip weakness. Overall her back pain has significantly improved. Patient instructed in advanced LE strengthening; She would benefit from additional skilled PT intervention to improve strength and core control;     Rehab Potential  Good    Clinical Impairments Affecting Rehab Potential  motivated, recent onset of deficits, negative: chronic pain;     PT Frequency  2x / week    PT Duration  12 weeks    PT Treatment/Interventions  Aquatic Therapy;Cryotherapy;Electrical Stimulation;Moist Heat;DME Instruction;Gait training;Stair training;Functional mobility training;Therapeutic activities;Therapeutic exercise;Manual techniques;Patient/family education;Neuromuscular re-education;Balance training;Passive range of motion;Energy conservation    PT Next Visit Plan  advance HEP    PT Home Exercise Plan  continue as given;     Consulted and  Agree with Plan of Care  Patient       Patient will benefit from skilled therapeutic intervention in order to improve the following deficits and impairments:  Abnormal gait, Decreased endurance, Hypomobility, Decreased activity tolerance, Decreased knowledge of use of DME, Decreased strength, Pain, Increased fascial restricitons, Increased muscle spasms, Difficulty walking, Decreased mobility, Decreased balance, Decreased range of motion, Improper body mechanics, Postural dysfunction, Impaired flexibility  Visit  Diagnosis: Chronic midline low back pain without sciatica  Abnormal posture  Difficulty in walking, not elsewhere classified  Muscle weakness (generalized)     Problem List Patient Active Problem List   Diagnosis Date Noted  . Breast cancer ([place]) 01/07/2018  . GERD (gastroesophageal reflux disease) 01/07/2018  . Insomnia 01/07/2018  . Migraine 01/07/2018  . Osteopenia 01/07/2018  . Arthritis 01/07/2018  . Chronic UTI 12/31/2017  . IBS (irritable bowel syndrome) 12/29/2017  . History of breast cancer 12/29/2017  . Hypothyroidism 12/29/2017  . Baker's cyst of knee, left 12/29/2017  . HTN (hypertension) 12/29/2017  . HLD (hyperlipidemia) 12/29/2017  . Nausea 12/29/2017  . Abdominal pain 12/29/2017  . Scoliosis of cervical region due to degenerative disease of spine in adult 09/18/2017  . Kidney stones 03/29/2015    Trotter,Margaret 01/19/2018, 3:16 PM  [place] MAIN [facility] SERVICES [address] [place], [place], [address] Phone: [phone]   Fax:  [phone]  Name: Maria Spencer MRN: [id number] Date of Birth: 1946-03-22

## 2018-01-20 DIAGNOSIS — M4185 Other forms of scoliosis, thoracolumbar region: Secondary | ICD-10-CM | POA: Diagnosis not present

## 2018-01-20 DIAGNOSIS — Z981 Arthrodesis status: Secondary | ICD-10-CM | POA: Diagnosis not present

## 2018-01-20 DIAGNOSIS — Z882 Allergy status to sulfonamides status: Secondary | ICD-10-CM | POA: Diagnosis not present

## 2018-01-20 DIAGNOSIS — Z79899 Other long term (current) drug therapy: Secondary | ICD-10-CM | POA: Diagnosis not present

## 2018-01-20 DIAGNOSIS — M419 Scoliosis, unspecified: Secondary | ICD-10-CM | POA: Diagnosis not present

## 2018-01-22 ENCOUNTER — Ambulatory Visit: Payer: Medicare Other | Admitting: Physical Therapy

## 2018-01-26 ENCOUNTER — Encounter: Payer: Self-pay | Admitting: Physical Therapy

## 2018-01-26 ENCOUNTER — Ambulatory Visit: Payer: Medicare Other | Attending: Neurosurgery | Admitting: Physical Therapy

## 2018-01-26 DIAGNOSIS — R293 Abnormal posture: Secondary | ICD-10-CM | POA: Insufficient documentation

## 2018-01-26 DIAGNOSIS — G8929 Other chronic pain: Secondary | ICD-10-CM | POA: Diagnosis not present

## 2018-01-26 DIAGNOSIS — M6281 Muscle weakness (generalized): Secondary | ICD-10-CM | POA: Diagnosis not present

## 2018-01-26 DIAGNOSIS — M545 Low back pain, unspecified: Secondary | ICD-10-CM

## 2018-01-26 DIAGNOSIS — R262 Difficulty in walking, not elsewhere classified: Secondary | ICD-10-CM | POA: Diagnosis not present

## 2018-01-26 NOTE — Patient Instructions (Addendum)
Band Walk: Zig Zag   Tie green band around legs, just above knees. Walk forward _both__ feet in a zig zag pattern. Without turning walk backward to start for one zig zag. Repeat _2-3__ zig zags per session.   http://plyo.exer.us/80   Copyright  VHI. All rights reserved.    Pelvic Tilt  Lying on back with knees bent, Flatten back by tightening stomach muscles and rocking hips back Hold for 5 sec, Repeat __10__ times per set. Do __1__ sets per session. Do __2__ sessions per day.  http://orth.exer.us/134     Hip Flexion - Supine    Lying on back, knees bent, feet on bed, tighten stomach muscles, alternate lifting one knee up and then the other (alternate march) while keeping stomach muscles engaged;  Repeat _10-15__ times. Do __1-2_ times per day.  Copyright  VHI. All rights reserved.  Sit-Up    While lying on back with ARMS BY SIDE.....  Take a deep breath in, when exhaling, push down through arms and curl up slightly (small motion)  Repeat 10 times, 1-2 times a day;  Copyright  VHI. All rights reserved.

## 2018-01-26 NOTE — Therapy (Signed)
[place] MAIN [facility] SERVICES [address] [place], [place], [address] Phone: [phone]   Fax:  [phone]  Physical Therapy Treatment  Patient Details  Name: Maria Spencer MRN: [id number] Date of Birth: Dec 27, 1945 Referring Provider: Dr. Glade Nurse   Encounter Date: 01/26/2018  PT End of Session - 01/26/18 0932    Visit Number  15    Number of Visits  25    Date for PT Re-Evaluation  02/04/18    PT Start Time  0925    PT Stop Time  1010    PT Time Calculation (min)  45 min    Activity Tolerance  Patient tolerated treatment well;No increased pain    Behavior During Therapy  WFL for tasks assessed/performed       Past Medical History:  Diagnosis Date  . Arthritis    neck, back, left knee;   . Breast cancer [facility]) 2009   right lumpectomy s/p radiation   . Chicken pox   . Chronic UTI    established with urology   . Diverticular disease    -osis and -itis   . GERD (gastroesophageal reflux disease)   . Hormone disorder   . Hyperlipidemia   . Hypertension    controlled well with medication;   . Thyroid disease    hypothyroidism     Past Surgical History:  Procedure Laterality Date  . BONE GRAFT HIP ILIAC CREST     + cage left hip 10/23/17   . BREAST LUMPECTOMY     2009 lumpectomy   . SHOULDER SURGERY     x2 surgeries both shoulders, right shoulder replacement last in 2012   . SPINE SURGERY     spinal 09/2017 h/o scoliosis   . TONSILLECTOMY     age 46 y.o.   . TOTAL SHOULDER REPLACEMENT      There were no vitals filed for this visit.  Subjective Assessment - 01/26/18 0929    Subjective  Patient reports continued right hip discomfort. She reports going to MD and has been cleared of all restrictions. She reports, "My husband is back home but he is needing home health services and we just have a lot going on."     Pertinent History  72 yo Female s/p scoliosis surgery with rod/screw placement from T10 to illium; Patient  reports having increased pain in back since surgery; Her surgery was on 10/20/17 and on 10/23/17 she had a bone graft; Patient went and got stitches removed on 11/10/17; Patient goes back for a follow up on Januray 15; She presents to therapy with walking stick; Patient reports using walking stick most of the time; However at home she can walk to bedroom/bathroom without the walking stick; She reports needing help with some ADLs including difficulty cleaning self, lower body dressing; denies any recent falls; She reports having some numbness in anterior left hip which the nurse practioner believes is related to bone graft; She reports having numbness in BLE prior to surgery but since surgery she is having numbness on anterior left leg which limits her ability to lift leg; Patient reports approximately 4 sleep disturbances per night related to back pain;     How long can you sit comfortably?  30 min;     How long can you stand comfortably?  10-15 min;     How long can you walk comfortably?  about 300-500 feet;     Diagnostic tests  Did x-rays which looked good after rod placement;  Patient Stated Goals  "To reduce pain and improve strength/mobility"    Currently in Pain?  Yes    Pain Score  2     Pain Location  Hip    Pain Orientation  Right    Pain Descriptors / Indicators  Aching;Sore    Pain Type  Surgical pain;Acute pain    Pain Onset  1 to 4 weeks ago    Pain Frequency  Intermittent    Aggravating Factors   worse with prolonged standing/walking    Pain Relieving Factors  pain meds    Effect of Pain on Daily Activities  decreased activity tolerance;     Multiple Pain Sites  No           TREATMENT: Warm up on Nustep BUE/BLE level2x62mn (unbilled)  Instructed patient in correct body mechanics and lifting techniques including: sweeping, vacuuming, floor to waist, floor to shoulder height lifting; Patient able to follow instruction with cues to avoid stooping, twisting and to improve  position with lifting;   Standing: Redtband around BLE: Hip abduction x12 bilaterally mod VCs to improve hip positioning for better hip strengthening and stabilization; Patient had significant difficulty lifting LLE due to weakness; Hip extensionx12reps bilaterally able to exhibit less hip drop as compared to previous sessions; Diagonal stepping red tband around BLE ankles x10 feet x2 sets forward/backward with mod VCs for positioning and to reduce right hip trendelenburg with LLE stepping.   Standing: LLE hip elevation, with toe tap to foamto challenge RLE hip abduction x15reps;  PT performed soft tissue massage to right/leftposterior hip and lateral LE (gluteals/IT band) with rolling stickto reduce tightness and discomfort; also performed soft tissue massage along quadratus lumborum to reduce tightnessx84m total; Patient tolerated well with less tightness and less pain at end of session; She was able to exhibit better tissue mobility and less tenderness to palpation;  Sidelying: Clamshells green tband 2x12reps bilaterally with min Vcs for positioning to improve hip strengthening;  Hooklying: Single knee to chest stretch 20 sec hold x2 bilaterally;  Posterior pelvic rock 5 sec hold x10 reps; Posterior pelvic rock with alternate march x10 reps bilaterally with cues to avoid trunk rotation for better core stabilization; Arms by side, push down through arms curl up x10 reps with min Vcs for positioning to improve core stabilization;   Patient tolerated session well;reports less stiffness and no pain at end of session;                    PT Education - 01/26/18 0931    Education provided  Yes    Education Details  HEP reinforced, LE strengthening, lifting techniques;     Person(s) Educated  Patient    Methods  Explanation;Demonstration;Verbal cues    Comprehension  Verbalized understanding;Returned demonstration;Verbal cues required;Need further  instruction       PT Short Term Goals - 01/19/18 1429      PT SHORT TERM GOAL #1   Title  Patient will reduce modified Oswestry score to <40% as to demonstrate less disability with ADLs including improved sleeping tolerance, walking/sitting tolerance etc for better mobility with ADLs.     Time  4    Period  Weeks    Status  Achieved      PT SHORT TERM GOAL #2   Title  Patient will increase BLE gross hip strength to 4/5 as to improve functional strength for independent gait, increased standing tolerance and increased ADL ability.    Time  4  Period  Weeks    Status  Partially Met    Target Date  12/10/17      PT SHORT TERM GOAL #3   Title  Patient (> 63 years old) will complete five times sit to stand test in < 15 seconds indicating an increased LE strength and improved balance.    Time  4    Period  Weeks    Status  Achieved        PT Long Term Goals - 01/19/18 1429      PT LONG TERM GOAL #1   Title  Patient will be independent in home exercise program to improve strength/mobility for better functional independence with ADLs.    Time  12    Period  Weeks    Status  On-going    Target Date  02/04/18      PT LONG TERM GOAL #2   Title  Patient will tolerate 5 seconds of single leg stance without loss of balance to improve ability to get in and out of shower safely.    Time  12    Period  Weeks    Status  Achieved      PT LONG TERM GOAL #3   Title  Patient will increase BLE gross strength to 4+/5 as to improve functional strength for independent gait, increased standing tolerance and increased ADL ability.    Time  12    Period  Weeks    Status  Partially Met    Target Date  02/04/18      PT LONG TERM GOAL #4   Title  Patient will report a worst pain of 3/10 on VAS in low back            to improve tolerance with ADLs and reduced symptoms with activities.     Baseline  2/10 worse pain;     Time  12    Period  Weeks    Status  Achieved      PT LONG TERM GOAL #5    Title  Patient will reduce modified Oswestry score to <20 as to demonstrate minimal disability with ADLs including improved sleeping tolerance, walking/sitting tolerance etc for better mobility with ADLs.     Time  12    Period  Weeks    Status  Partially Met    Target Date  02/04/18            Plan - 01/26/18 1008    Clinical Impression Statement  Patient educated in correct body mechanics and lifting techniques; Patient able to exhibit better positioning with instruction; Provided written handout for better adherence. Instructed patient in advanced core abdominal and hip strengthening. She requires min Vcs for correct position and exercise technique. She responded well to cues with better abdominal control; Patient would benefit from additional skilled PT intervention to improve strength, postural control and reduce hip discomfort;     Rehab Potential  Good    Clinical Impairments Affecting Rehab Potential  motivated, recent onset of deficits, negative: chronic pain;     PT Frequency  2x / week    PT Duration  12 weeks    PT Treatment/Interventions  Aquatic Therapy;Cryotherapy;Electrical Stimulation;Moist Heat;DME Instruction;Gait training;Stair training;Functional mobility training;Therapeutic activities;Therapeutic exercise;Manual techniques;Patient/family education;Neuromuscular re-education;Balance training;Passive range of motion;Energy conservation    PT Next Visit Plan  advance HEP    PT Home Exercise Plan  advanced- see patient instructions;     Consulted and Agree with Plan of Care  Patient       Patient will benefit from skilled therapeutic intervention in order to improve the following deficits and impairments:  Abnormal gait, Decreased endurance, Hypomobility, Decreased activity tolerance, Decreased knowledge of use of DME, Decreased strength, Pain, Increased fascial restricitons, Increased muscle spasms, Difficulty walking, Decreased mobility, Decreased balance, Decreased  range of motion, Improper body mechanics, Postural dysfunction, Impaired flexibility  Visit Diagnosis: Chronic midline low back pain without sciatica  Abnormal posture  Difficulty in walking, not elsewhere classified  Muscle weakness (generalized)     Problem List Patient Active Problem List   Diagnosis Date Noted  . Breast cancer ([place]) 01/07/2018  . GERD (gastroesophageal reflux disease) 01/07/2018  . Insomnia 01/07/2018  . Migraine 01/07/2018  . Osteopenia 01/07/2018  . Arthritis 01/07/2018  . Chronic UTI 12/31/2017  . IBS (irritable bowel syndrome) 12/29/2017  . History of breast cancer 12/29/2017  . Hypothyroidism 12/29/2017  . Baker's cyst of knee, left 12/29/2017  . HTN (hypertension) 12/29/2017  . HLD (hyperlipidemia) 12/29/2017  . Nausea 12/29/2017  . Abdominal pain 12/29/2017  . Scoliosis of cervical region due to degenerative disease of spine in adult 09/18/2017  . Kidney stones 03/29/2015    Trotter,Margaret PT, DPT 01/26/2018, 10:16 AM  [place] MAIN [facility] SERVICES [address] [place], [place], [address] Phone: [phone]   Fax:  [phone]  Name: Maria Spencer MRN: [id number] Date of Birth: Dec 08, 1945

## 2018-02-02 ENCOUNTER — Encounter: Payer: Self-pay | Admitting: Physical Therapy

## 2018-02-02 ENCOUNTER — Ambulatory Visit: Payer: Medicare Other | Admitting: Physical Therapy

## 2018-02-02 DIAGNOSIS — M545 Low back pain, unspecified: Secondary | ICD-10-CM

## 2018-02-02 DIAGNOSIS — G8929 Other chronic pain: Secondary | ICD-10-CM

## 2018-02-02 DIAGNOSIS — R262 Difficulty in walking, not elsewhere classified: Secondary | ICD-10-CM

## 2018-02-02 DIAGNOSIS — R293 Abnormal posture: Secondary | ICD-10-CM | POA: Diagnosis not present

## 2018-02-02 DIAGNOSIS — M6281 Muscle weakness (generalized): Secondary | ICD-10-CM | POA: Diagnosis not present

## 2018-02-02 NOTE — Therapy (Signed)
[place] MAIN [facility] SERVICES [address] [place], [place], [address] Phone: [phone]   Fax:  [phone]  Physical Therapy Treatment and Discharge  Patient Details  Name: Maria Spencer MRN: [id number] Date of Birth: Dec 28, 1945 Referring Provider: Dr. Glade Nurse   Encounter Date: 02/02/2018  PT End of Session - 02/02/18 0843    Visit Number  16    Number of Visits  25    Date for PT Re-Evaluation  02/04/18    PT Start Time  0845    PT Stop Time  0929    PT Time Calculation (min)  44 min    Activity Tolerance  Patient tolerated treatment well;No increased pain    Behavior During Therapy  WFL for tasks assessed/performed       Past Medical History:  Diagnosis Date  . Arthritis    neck, back, left knee;   . Breast cancer [facility]) 2009   right lumpectomy s/p radiation   . Chicken pox   . Chronic UTI    established with urology   . Diverticular disease    -osis and -itis   . GERD (gastroesophageal reflux disease)   . Hormone disorder   . Hyperlipidemia   . Hypertension    controlled well with medication;   . Thyroid disease    hypothyroidism     Past Surgical History:  Procedure Laterality Date  . BONE GRAFT HIP ILIAC CREST     + cage left hip 10/23/17   . BREAST LUMPECTOMY     2009 lumpectomy   . SHOULDER SURGERY     x2 surgeries both shoulders, right shoulder replacement last in 2012   . SPINE SURGERY     spinal 09/2017 h/o scoliosis   . TONSILLECTOMY     age 79 y.o.   . TOTAL SHOULDER REPLACEMENT      There were no vitals filed for this visit.  Subjective Assessment - 02/02/18 0854    Subjective  Pt reports that one of her exercises is too hard (clamshell) and causes pain down RLE which kept her up last night, she would like to review this.  Pt would like today to be her last day due to time constraints.     Pertinent History  72 yo Female s/p scoliosis surgery with rod/screw placement from T10 to illium; Patient  reports having increased pain in back since surgery; Her surgery was on 10/20/17 and on 10/23/17 she had a bone graft; Patient went and got stitches removed on 11/10/17; Patient goes back for a follow up on Januray 15; She presents to therapy with walking stick; Patient reports using walking stick most of the time; However at home she can walk to bedroom/bathroom without the walking stick; She reports needing help with some ADLs including difficulty cleaning self, lower body dressing; denies any recent falls; She reports having some numbness in anterior left hip which the nurse practioner believes is related to bone graft; She reports having numbness in BLE prior to surgery but since surgery she is having numbness on anterior left leg which limits her ability to lift leg; Patient reports approximately 4 sleep disturbances per night related to back pain;     How long can you sit comfortably?  30 min;     How long can you stand comfortably?  10-15 min;     How long can you walk comfortably?  about 300-500 feet;     Diagnostic tests  Did x-rays which looked good  after rod placement;     Patient Stated Goals  "To reduce pain and improve strength/mobility"    Currently in Pain?  No/denies    Pain Onset  --         [facility] PT Assessment - 02/02/18 0901      Strength   Right Hip Flexion  4/5    Right Hip ABduction  4-/5    Right Hip ADduction  4+/5    Left Hip Flexion  4/5    Left Hip ABduction  4/5    Left Hip ADduction  4+/5    Right Knee Flexion  5/5    Right Knee Extension  5/5    Left Knee Flexion  5/5    Left Knee Extension  5/5    Right Ankle Dorsiflexion  4+/5    Right Ankle Plantar Flexion  4+/5       TREATMENT:  Reviewed HEP:  RLE Clamshells with GTB. Cues for proper placement of Bil feet and cues to prevent hip stacking with technique of sensory feedback from hand. Pt performed 1st set x15 reps and 2nd set x10 reps only due to fatigue. Instructed pt to only perform 10 reps at a  time.  LLE clamshells with GTB with cues to use hand for feedback. 2x10.  Hooklying breathing exercises with activation and relaxation of TA.  Upper rectus abdominal curls with 5 second holds x10. Cues to lift chest rather than just her neck to properly activate core.  Single knee to chest with cues to activate core in addition to stretch. x10 each LE.  Modified piriformis stretch with 10 second holds x1 each LE.  Seated single leg hip ER/Abd x10 each direction with GTB. Noticed pt with compensatory trunk leaning at end range. Instructed pt in completing exercise through range in which she does not need to perform compensatory movements. 2x10 each LE.  Marching in sitting with GTB resistance x10 each LE with cues to avoid compensatory trunk lean.  RTB around ankles hip Abd x10 each LE.  Pt verbalized remainder of her HEP exercises and denies any difficulty or questions with these.                     PT Education - 02/02/18 0843    Education provided  Yes    Education Details  Exercise technique; continue HEP as prescribed and modified this session; D/c this date.    Person(s) Educated  Patient    Methods  Explanation;Demonstration;Verbal cues    Comprehension  Verbalized understanding;Returned demonstration;Verbal cues required       PT Short Term Goals - 02/02/18 1126      PT SHORT TERM GOAL #1   Title  Patient will reduce modified Oswestry score to <40% as to demonstrate less disability with ADLs including improved sleeping tolerance, walking/sitting tolerance etc for better mobility with ADLs.     Time  4    Period  Weeks    Status  Achieved      PT SHORT TERM GOAL #2   Title  Patient will increase BLE gross hip strength to 4/5 as to improve functional strength for independent gait, increased standing tolerance and increased ADL ability.    Time  4    Period  Weeks    Status  Partially Met      PT SHORT TERM GOAL #3   Title  Patient (> 72 years old) will  complete five times sit to stand test in <  15 seconds indicating an increased LE strength and improved balance.    Time  4    Period  Weeks    Status  Achieved        PT Long Term Goals - 02/02/18 1025      PT LONG TERM GOAL #1   Title  Patient will be independent in home exercise program to improve strength/mobility for better functional independence with ADLs.    Baseline  Pt has been completing her HEP 1x/day.     Time  12    Period  Weeks    Status  Achieved      PT LONG TERM GOAL #2   Title  Patient will tolerate 5 seconds of single leg stance without loss of balance to improve ability to get in and out of shower safely.    Time  12    Period  Weeks    Status  Achieved      PT LONG TERM GOAL #3   Title  Patient will increase BLE gross strength to 4+/5 as to improve functional strength for independent gait, increased standing tolerance and increased ADL ability.    Baseline  3/11: Improved since eval, see notes.    Time  12    Period  Weeks    Status  Partially Met      PT LONG TERM GOAL #4   Title  Patient will report a worst pain of 3/10 on VAS in low back to improve tolerance with ADLs and reduced symptoms with activities.     Baseline  2/10 worse pain;     Time  12    Period  Weeks    Status  Achieved      PT LONG TERM GOAL #5   Title  Patient will reduce modified Oswestry score to <20 as to demonstrate minimal disability with ADLs including improved sleeping tolerance, walking/sitting tolerance etc for better mobility with ADLs.     Baseline  3/11: 6%    Time  12    Period  Weeks    Status  Achieved            Plan - 02/02/18 0857    Clinical Impression Statement  Pt has met almost all therapy goals and demonstrates compliance with HEP. She did not completely meet her strength goals but demonstrates significant improvement with BLE strength.  Her pain, SLS balance, and strength has improved since starting therapy.  Her mODI suggests a decreased impact of  pt's back pain on her daily activities and showed a significant improvement since inititial evaluation.  Pt completed HEP this session.  She reported that her clamshell at home was causing her some pain.  This was modified and cues provided for proper technique for decreased pain.  Provided intermittent cues with remaining exercises for proper technique.  Pt has met most of her goals and has made significant gains in strength since beginning therapy.  Pt will be discharged at this time.     Rehab Potential  Good    Clinical Impairments Affecting Rehab Potential  motivated, recent onset of deficits, negative: chronic pain;     PT Frequency  -- n/a    PT Duration  -- d/c this visit    PT Treatment/Interventions  Aquatic Therapy;Cryotherapy;Electrical Stimulation;Moist Heat;DME Instruction;Gait training;Stair training;Functional mobility training;Therapeutic activities;Therapeutic exercise;Manual techniques;Patient/family education;Neuromuscular re-education;Balance training;Passive range of motion;Energy conservation    PT Next Visit Plan  d/c this date    PT Home Exercise Plan  continue as given and modified this session    Consulted and Agree with Plan of Care  Patient       Patient will benefit from skilled therapeutic intervention in order to improve the following deficits and impairments:  Abnormal gait, Decreased endurance, Hypomobility, Decreased activity tolerance, Decreased knowledge of use of DME, Decreased strength, Pain, Increased fascial restricitons, Increased muscle spasms, Difficulty walking, Decreased mobility, Decreased balance, Decreased range of motion, Improper body mechanics, Postural dysfunction, Impaired flexibility  Visit Diagnosis: Chronic midline low back pain without sciatica  Abnormal posture  Difficulty in walking, not elsewhere classified  Muscle weakness (generalized)     Problem List Patient Active Problem List   Diagnosis Date Noted  . Breast cancer ([place])  01/07/2018  . GERD (gastroesophageal reflux disease) 01/07/2018  . Insomnia 01/07/2018  . Migraine 01/07/2018  . Osteopenia 01/07/2018  . Arthritis 01/07/2018  . Chronic UTI 12/31/2017  . IBS (irritable bowel syndrome) 12/29/2017  . History of breast cancer 12/29/2017  . Hypothyroidism 12/29/2017  . Baker's cyst of knee, left 12/29/2017  . HTN (hypertension) 12/29/2017  . HLD (hyperlipidemia) 12/29/2017  . Nausea 12/29/2017  . Abdominal pain 12/29/2017  . Scoliosis of cervical region due to degenerative disease of spine in adult 09/18/2017  . Kidney stones 03/29/2015    Collie Siad PT, DPT 02/02/2018, 11:26 AM  [place] MAIN [facility] SERVICES [address] [place], [place], [address] Phone: [phone]   Fax:  [phone]  Name: Maria Spencer MRN: [id number] Date of Birth: January 29, 1946

## 2018-02-04 ENCOUNTER — Ambulatory Visit: Payer: Medicare Other | Admitting: Gastroenterology

## 2018-02-05 ENCOUNTER — Encounter: Payer: Medicare Other | Admitting: Physical Therapy

## 2018-02-09 ENCOUNTER — Encounter: Payer: Medicare Other | Admitting: Physical Therapy

## 2018-02-11 ENCOUNTER — Encounter: Payer: Medicare Other | Admitting: Physical Therapy

## 2018-02-16 ENCOUNTER — Ambulatory Visit: Payer: Medicare Other | Admitting: Physical Therapy

## 2018-02-19 ENCOUNTER — Encounter: Payer: Medicare Other | Admitting: Physical Therapy

## 2018-02-24 ENCOUNTER — Encounter: Payer: Medicare Other | Admitting: Internal Medicine

## 2018-03-02 DIAGNOSIS — M533 Sacrococcygeal disorders, not elsewhere classified: Secondary | ICD-10-CM | POA: Diagnosis not present

## 2018-03-02 DIAGNOSIS — M5431 Sciatica, right side: Secondary | ICD-10-CM | POA: Diagnosis not present

## 2018-03-26 ENCOUNTER — Ambulatory Visit (INDEPENDENT_AMBULATORY_CARE_PROVIDER_SITE_OTHER): Payer: Medicare Other | Admitting: Internal Medicine

## 2018-03-26 ENCOUNTER — Encounter: Payer: Self-pay | Admitting: Internal Medicine

## 2018-03-26 VITALS — BP 140/76 | HR 84 | Temp 98.6°F | Ht 59.0 in | Wt 130.8 lb

## 2018-03-26 DIAGNOSIS — K4090 Unilateral inguinal hernia, without obstruction or gangrene, not specified as recurrent: Secondary | ICD-10-CM | POA: Diagnosis not present

## 2018-03-26 DIAGNOSIS — I10 Essential (primary) hypertension: Secondary | ICD-10-CM | POA: Diagnosis not present

## 2018-03-26 DIAGNOSIS — K469 Unspecified abdominal hernia without obstruction or gangrene: Secondary | ICD-10-CM | POA: Diagnosis not present

## 2018-03-26 DIAGNOSIS — E039 Hypothyroidism, unspecified: Secondary | ICD-10-CM | POA: Diagnosis not present

## 2018-03-26 DIAGNOSIS — D509 Iron deficiency anemia, unspecified: Secondary | ICD-10-CM | POA: Diagnosis not present

## 2018-03-26 DIAGNOSIS — E785 Hyperlipidemia, unspecified: Secondary | ICD-10-CM | POA: Diagnosis not present

## 2018-03-26 DIAGNOSIS — Z0001 Encounter for general adult medical examination with abnormal findings: Secondary | ICD-10-CM | POA: Diagnosis not present

## 2018-03-26 DIAGNOSIS — C50911 Malignant neoplasm of unspecified site of right female breast: Secondary | ICD-10-CM | POA: Diagnosis not present

## 2018-03-26 DIAGNOSIS — R7303 Prediabetes: Secondary | ICD-10-CM

## 2018-03-26 DIAGNOSIS — Z853 Personal history of malignant neoplasm of breast: Secondary | ICD-10-CM | POA: Diagnosis not present

## 2018-03-26 DIAGNOSIS — K582 Mixed irritable bowel syndrome: Secondary | ICD-10-CM

## 2018-03-26 NOTE — Progress Notes (Addendum)
Chief Complaint  Patient presents with  . Annual Exam   Annual physical  1. C/o hernia to left lateral abdomen and left inguinal region with bulge and twing of pain since back surgery end 09/2018. Able to have stools normally no significant pain in abdomen. She does report black stools but she is taking peptobismol  2. Saw GI sx'ed IBS and tried elavil 25 mg qd, and bentyl w/o help  3. Due for mammogram h/o right breast cancer  4. HTN controlled on lis 20 mg   Review of Systems  Constitutional: Positive for weight loss.       Down 3 lbs   HENT: Negative for hearing loss.   Eyes: Negative for blurred vision.  Respiratory: Negative for shortness of breath.   Cardiovascular: Negative for chest pain.  Gastrointestinal:       +bulge in abdomen   Musculoskeletal: Negative for falls.  Skin: Negative for rash.  Neurological: Negative for headaches.  Psychiatric/Behavioral: Negative for depression.   Past Medical History:  Diagnosis Date  . Arthritis    neck, back, left knee;   . Breast cancer [facility] 2009   right lumpectomy s/p radiation   . Chicken pox   . Chronic UTI    established with urology   . Diverticular disease    -osis and -itis   . GERD (gastroesophageal reflux disease)   . Hormone disorder   . Hyperlipidemia   . Hypertension    controlled well with medication;   . Thyroid disease    hypothyroidism    Past Surgical History:  Procedure Laterality Date  . BONE GRAFT HIP ILIAC CREST     + cage left hip 10/23/17   . BREAST LUMPECTOMY     2009 lumpectomy   . SHOULDER SURGERY     x2 surgeries both shoulders, right shoulder replacement last in 2012   . SPINE SURGERY     spinal 09/2017 h/o scoliosis   . TONSILLECTOMY     age 23 y.o.   . TOTAL SHOULDER REPLACEMENT     Family History  Problem Relation Age of Onset  . Arthritis Mother   . Heart disease Mother   . Hyperlipidemia Mother   . Hypertension Mother   . Arthritis Father   . Diabetes Father   . Cancer  Father        colon  . Arthritis Sister   . Hyperlipidemia Sister   . Hypertension Sister   . Cancer Brother        lung, smoker  . Mental retardation Brother   . Drug abuse Daughter   . Arthritis Sister   . Hyperlipidemia Sister   . Bladder Cancer Neg Hx   . Kidney cancer Neg Hx    Social History   Socioeconomic History  . Marital status: Married    Spouse name: Not on file  . Number of children: Not on file  . Years of education: Not on file  . Highest education level: Not on file  Occupational History  . Not on file  Social Needs  . Financial resource strain: Not on file  . Food insecurity:    Worry: Not on file    Inability: Not on file  . Transportation needs:    Medical: Not on file    Non-medical: Not on file  Tobacco Use  . Smoking status: Never Smoker  . Smokeless tobacco: Never Used  Substance and Sexual Activity  . Alcohol use: Yes  . Drug use: No  .  Sexual activity: Not on file  Lifestyle  . Physical activity:    Days per week: Not on file    Minutes per session: Not on file  . Stress: Not on file  Relationships  . Social connections:    Talks on phone: Not on file    Gets together: Not on file    Attends religious service: Not on file    Active member of club or organization: Not on file    Attends meetings of clubs or organizations: Not on file    Relationship status: Not on file  . Intimate partner violence:    Fear of current or ex partner: Not on file    Emotionally abused: Not on file    Physically abused: Not on file    Forced sexual activity: Not on file  Other Topics Concern  . Not on file  Social History Narrative   College grad   Married    Wears seatbelt and safe in relationship    3 kids       Current Meds  Medication Sig  . atorvastatin (LIPITOR) 20 MG tablet Take 0.5 tablets (10 mg total) by mouth daily at 6 PM.  . levothyroxine (SYNTHROID, LEVOTHROID) 88 MCG tablet Take 1 tablet (88 mcg total) by mouth daily before  breakfast.  . lisinopril (PRINIVIL,ZESTRIL) 20 MG tablet Take 1 tablet (20 mg total) by mouth daily.  . Multiple Vitamin (MULTI-VITAMINS) TABS Take by mouth.  . nitrofurantoin, macrocrystal-monohydrate, (MACROBID) 100 MG capsule Take 1 capsule (100 mg total) daily by mouth.  . zolpidem (AMBIEN) 5 MG tablet Take by mouth.  . [DISCONTINUED] amitriptyline (ELAVIL) 25 MG tablet Take 1 tablet (25 mg total) by mouth at bedtime.  . [DISCONTINUED] dicyclomine (BENTYL) 10 MG capsule Take 1 capsule (10 mg total) by mouth 4 (four) times daily -  before meals and at bedtime.   Allergies  Allergen Reactions  . Sulfa Antibiotics Itching    Hives anaphylaxis   Recent Results (from the past 2160 hour(s))  Hepatitis C antibody     Status: None   Collection Time: 12/30/17  8:36 AM  Result Value Ref Range   Hepatitis C Ab NON-REACTIVE NON-REACTI   SIGNAL TO CUT-OFF 0.02 <1.00  Hepatitis B surface antigen     Status: None   Collection Time: 12/30/17  8:36 AM  Result Value Ref Range   Hepatitis B Surface Ag NON-REACTIVE NON-REACTI  Hepatitis B surface antibody     Status: Abnormal   Collection Time: 12/30/17  8:36 AM  Result Value Ref Range   Hepatitis B-Post <5 (L) > OR = 10 mIU/mL    Comment: . Patient does not have immunity to hepatitis B virus. . For additional information, please refer to http://education.questdiagnostics.com/faq/FAQ105 (This link is being provided for informational/ educational purposes only).   Lipid panel     Status: Abnormal   Collection Time: 12/30/17  8:36 AM  Result Value Ref Range   Cholesterol 208 (H) 0 - 200 mg/dL    Comment: ATP III Classification       Desirable:  < 200 mg/dL               Borderline High:  200 - 239 mg/dL          High:  > = 240 mg/dL   Triglycerides 115.0 0.0 - 149.0 mg/dL    Comment: Normal:  <150 mg/dLBorderline High:  150 - 199 mg/dL   HDL 75.80 >39.00 mg/dL   VLDL  23.0 0.0 - 40.0 mg/dL   LDL Cholesterol 109 (H) 0 - 99 mg/dL   Total  CHOL/HDL Ratio 3     Comment:                Men          Women1/2 Average Risk     3.4          3.3Average Risk          5.0          4.42X Average Risk          9.6          7.13X Average Risk          15.0          11.0                       NonHDL 132.03     Comment: NOTE:  Non-HDL goal should be 30 mg/dL higher than patient's LDL goal (i.e. LDL goal of < 70 mg/dL, would have non-HDL goal of < 100 mg/dL)  Hemoglobin A1c     Status: None   Collection Time: 12/30/17  8:36 AM  Result Value Ref Range   Hgb A1c MFr Bld 5.8 4.6 - 6.5 %    Comment: Glycemic Control Guidelines for People with Diabetes:Non Diabetic:  <6%Goal of Therapy: <7%Additional Action Suggested:  >8%   TSH     Status: None   Collection Time: 12/30/17  8:36 AM  Result Value Ref Range   TSH 3.56 0.35 - 4.50 uIU/mL  Hepatic function panel     Status: Abnormal   Collection Time: 12/30/17  8:36 AM  Result Value Ref Range   Total Bilirubin 0.6 0.2 - 1.2 mg/dL   Bilirubin, Direct 0.1 0.0 - 0.3 mg/dL   Alkaline Phosphatase 119 (H) 39 - 117 U/L   AST 15 0 - 37 U/L   ALT 9 0 - 35 U/L   Total Protein 7.5 6.0 - 8.3 g/dL   Albumin 4.1 3.5 - 5.2 g/dL  CBC with Differential/Platelet     Status: Abnormal   Collection Time: 12/30/17  8:36 AM  Result Value Ref Range   WBC 6.6 4.0 - 10.5 K/uL   RBC 4.74 3.87 - 5.11 Mil/uL   Hemoglobin 11.9 (L) 12.0 - 15.0 g/dL   HCT 37.5 36.0 - 46.0 %   MCV 79.1 78.0 - 100.0 fl   MCHC 31.7 30.0 - 36.0 g/dL   RDW 16.2 (H) 11.5 - 15.5 %   Platelets 306.0 150.0 - 400.0 K/uL   Neutrophils Relative % 76.2 43.0 - 77.0 %   Lymphocytes Relative 14.0 12.0 - 46.0 %   Monocytes Relative 6.0 3.0 - 12.0 %   Eosinophils Relative 2.9 0.0 - 5.0 %   Basophils Relative 0.9 0.0 - 3.0 %   Neutro Abs 5.0 1.4 - 7.7 K/uL   Lymphs Abs 0.9 0.7 - 4.0 K/uL   Monocytes Absolute 0.4 0.1 - 1.0 K/uL   Eosinophils Absolute 0.2 0.0 - 0.7 K/uL   Basophils Absolute 0.1 0.0 - 0.1 K/uL  B12     Status: None   Collection Time:  01/07/18  3:38 PM  Result Value Ref Range   Vitamin B-12 261 180 - 914 pg/mL    Comment: (NOTE) This assay is not validated for testing neonatal or myeloproliferative syndrome specimens for Vitamin B12 levels. Performed at [facility], [place]  [address] [place], [place] [address]   Iron and TIBC     Status: Abnormal   Collection Time: 01/07/18  3:38 PM  Result Value Ref Range   Iron 24 (L) 28 - 170 ug/dL   TIBC 326 250 - 450 ug/dL   Saturation Ratios 7 (L) 10.4 - 31.8 %   UIBC 302 ug/dL    Comment: Performed at [facility], [address] [place], [place] [address]  Ferritin     Status: None   Collection Time: 01/07/18  3:38 PM  Result Value Ref Range   Ferritin 31 11 - 307 ng/mL    Comment: Performed at [facility] [address] [place], [place] [address]   Objective  Body mass index is 26.42 kg/m. Wt Readings from Last 3 Encounters:  03/26/18 130 lb 12.8 oz (59.3 kg)  01/07/18 133 lb 3.2 oz (60.4 kg)  12/29/17 133 lb (60.3 kg)   Temp Readings from Last 3 Encounters:  03/26/18 98.6 F (37 C) (Oral)  01/07/18 98.2 F (36.8 C) (Oral)  12/29/17 98.1 F (36.7 C) (Oral)   BP Readings from Last 3 Encounters:  03/26/18 140/76  01/07/18 133/78  12/29/17 126/86   Pulse Readings from Last 3 Encounters:  03/26/18 84  01/07/18 98  12/29/17 67    Physical Exam  Constitutional: She is oriented to person, place, and time. Vital signs are normal. She appears well-developed and well-nourished. She is cooperative.  HENT:  Head: Normocephalic and atraumatic.  Mouth/Throat: Oropharynx is clear and moist and mucous membranes are normal.  Eyes: Pupils are equal, round, and reactive to light. Conjunctivae are normal.  Cardiovascular: Normal rate, regular rhythm and normal heart sounds.  Pulmonary/Chest: Effort normal and breath sounds normal. Right breast exhibits mass. Right breast exhibits no inverted nipple, no nipple discharge, no skin change and  no tenderness. Left breast exhibits no inverted nipple, no mass, no nipple discharge, no skin change and no tenderness. No breast swelling, tenderness, discharge or bleeding. Breasts are symmetrical.    Abdominal: Soft. Bowel sounds are normal. There is tenderness. A hernia is present. Hernia confirmed positive in the left inguinal area.    Bulge in left upper abdomen  Bulge in left inguinal region   Neurological: She is alert and oriented to person, place, and time. Gait normal.  Skin: Skin is warm, dry and intact.  No concerning skin moles    Psychiatric: She has a normal mood and affect. Her speech is normal and behavior is normal. Judgment and thought content normal. Cognition and memory are normal.  Nursing note and vitals reviewed.   Assessment   1. Annual exam  2. C/w hernia lateral abdominal wall left and left inguinal  3. Iron def anemia  4. HTN controlled/HLD/prediabetes 5. H/o right breast cancer  Plan  1.  Breast exam today with 12 oclock scar and ? Scar tissue right breast Referred diagnostic mammo b/l s/p right breast lumpectomy 2009 and radiation only  Get report Kimberly  phone [phone] mammogram last  Pap out of age window no h/o abnormal pap  DEXA h/o osteopenia wait on repeat in future offer  Colonoscopy copy obtained reviewed 01/10/09 grade 1 IH and diverticulosis  rec healthy diet and exercise  Had flu shot  prevnar had  Declines Tdap, shingrix, hep B vaccine for now  zostervax had 2012  Never smoker   2. CT ab/pelvis with contrast  3. Rec take iron otc 325 mg qd  4. Cont lis 20 mg qd, lipitor 20 mg qd  Given cholesterol handout  rec healthy diet and exercise  5. See #1   Reviewed NS notes 05/05/18 [facility] rec add gabapentin 100 tid and consider inc to 300 tid consider pmr referral and f/u in 6 months repeat Xrays  Provider: Dr. Olivia Mackie McLean-Scocuzza-Internal Medicine

## 2018-03-26 NOTE — Patient Instructions (Addendum)
Please call to schedule your mammogram but they may want you to come sign a release of records as well or [place] to get your old mammogram from [facility]   Please take ferrous sulfate 325 mg daily (65 Fe) or make sure its in your multivitamin   Please see me in 3 months    Iron Deficiency Anemia, Adult Iron-deficiency anemia is when you have a low amount of red blood cells or hemoglobin. This happens because you have too little iron in your body. Hemoglobin carries oxygen to parts of the body. Anemia can cause your body to not get enough oxygen. It may or may not cause symptoms. Follow these instructions at home: Medicines  Take over-the-counter and prescription medicines only as told by your doctor. This includes iron pills (supplements) and vitamins.  If you cannot handle taking iron pills by mouth, ask your doctor about getting iron through: ? A vein (intravenously). ? A shot (injection) into a muscle.  Take iron pills when your stomach is empty. If you cannot handle this, take them with food.  Do not drink milk or take antacids at the same time as your iron pills.  To prevent trouble pooping (constipation), eat fiber or take medicine (stool softener) as told by your doctor. Eating and drinking  Talk with your doctor before changing the foods you eat. He or she may tell you to eat foods that have a lot of iron, such as: ? Liver. ? Lowfat (lean) beef. ? Breads and cereals that have iron added to them (fortified breads and cereals). ? Eggs. ? Dried fruit. ? Dark green, leafy vegetables.  Drink enough fluid to keep your pee (urine) clear or pale yellow.  Eat fresh fruits and vegetables that are high in vitamin C. They help your body to use iron. Foods with a lot of vitamin C include: ? Oranges. ? Peppers. ? Tomatoes. ? Mangoes. General instructions  Return to your normal activities as told by your doctor. Ask your doctor what activities are safe for you.  Keep yourself clean,  and keep things clean around you (your surroundings). Anemia can make you get sick more easily.  Keep all follow-up visits as told by your doctor. This is important. Contact a doctor if:  You feel sick to your stomach (nauseous).  You throw up (vomit).  You feel weak.  You are sweating for no clear reason.  You have trouble pooping, such as: ? Pooping (having a bowel movement) less than 3 times a week. ? Straining to poop. ? Having poop that is hard, dry, or larger than normal. ? Feeling full or bloated. ? Pain in the lower belly. ? Not feeling better after pooping. Get help right away if:  You pass out (faint). If this happens, do not drive yourself to the hospital. Call your local emergency services (911 in the [place]  You have chest pain.  You have shortness of breath that: ? Is very bad. ? Gets worse with physical activity.  You have a fast heartbeat.  You get light-headed when getting up from sitting or lying down. This information is not intended to replace advice given to you by your health care provider. Make sure you discuss any questions you have with your health care provider. Document Released: 12/14/2010 Document Revised: 07/31/2016 Document Reviewed: 07/31/2016 Elsevier Interactive Patient Education  2017 [employer], Adult A hernia is the bulging of an organ or tissue through a weak spot in the muscles of the  abdomen (abdominal wall). Hernias develop most often near the navel or groin. There are many kinds of hernias. Common kinds include:  Femoral hernia. This kind of hernia develops under the groin in the upper thigh area.  Inguinal hernia. This kind of hernia develops in the groin or scrotum.  Umbilical hernia. This kind of hernia develops near the navel.  Hiatal hernia. This kind of hernia causes part of the stomach to be pushed up into the chest.  Incisional hernia. This kind of hernia bulges through a scar from an abdominal  surgery.  What are the causes? This condition may be caused by:  Heavy lifting.  Coughing over a long period of time.  Straining to have a bowel movement.  An incision made during an abdominal surgery.  A birth defect (congenital defect).  Excess weight or obesity.  Smoking.  Poor nutrition.  Cystic fibrosis.  Excess fluid in the abdomen.  Undescended testicles.  What are the signs or symptoms? Symptoms of a hernia include:  A lump on the abdomen. This is the first sign of a hernia. The lump may become more obvious with standing, straining, or coughing. It may get bigger over time if it is not treated or if the condition causing it is not treated.  Pain. A hernia is usually painless, but it may become painful over time if treatment is delayed. The pain is usually dull and may get worse with standing or lifting heavy objects.  Sometimes a hernia gets tightly squeezed in the weak spot (strangulated) or stuck there (incarcerated) and causes additional symptoms. These symptoms may include:  Vomiting.  Nausea.  Constipation.  Irritability.  How is this diagnosed? A hernia may be diagnosed with:  A physical exam. During the exam your health care provider may ask you to cough or to make a specific movement, because a hernia is usually more visible when you move.  Imaging tests. These can include: ? X-rays. ? Ultrasound. ? CT scan.  How is this treated? A hernia that is small and painless may not need to be treated. A hernia that is large or painful may be treated with surgery. Inguinal hernias may be treated with surgery to prevent incarceration or strangulation. Strangulated hernias are always treated with surgery, because lack of blood to the trapped organ or tissue can cause it to die. Surgery to treat a hernia involves pushing the bulge back into place and repairing the weak part of the abdomen. Follow these instructions at home:  Avoid straining.  Do not  lift anything heavier than 10 lb (4.5 kg).  Lift with your leg muscles, not your back muscles. This helps avoid strain.  When coughing, try to cough gently.  Prevent constipation. Constipation leads to straining with bowel movements, which can make a hernia worse or cause a hernia repair to break down. You can prevent constipation by: ? Eating a high-fiber diet that includes plenty of fruits and vegetables. ? Drinking enough fluids to keep your urine clear or pale yellow. Aim to drink 6-8 glasses of water per day. ? Using a stool softener as directed by your health care provider.  Lose weight, if you are overweight.  Do not use any tobacco products, including cigarettes, chewing tobacco, or electronic cigarettes. If you need help quitting, ask your health care provider.  Keep all follow-up visits as directed by your health care provider. This is important. Your health care provider may need to monitor your condition. Contact a health care provider if:  You have swelling, redness, and pain in the affected area.  Your bowel habits change. Get help right away if:  You have a fever.  You have abdominal pain that is getting worse.  You feel nauseous or you vomit.  You cannot push the hernia back in place by gently pressing on it while you are lying down.  The hernia: ? Changes in shape or size. ? Is stuck outside the abdomen. ? Becomes discolored. ? Feels hard or tender. This information is not intended to replace advice given to you by your health care provider. Make sure you discuss any questions you have with your health care provider. Document Released: 11/11/2005 Document Revised: 04/10/2016 Document Reviewed: 09/21/2014 Elsevier Interactive Patient Education  2017 [employer].

## 2018-03-26 NOTE — Progress Notes (Signed)
Pre visit review using our clinic review tool, if applicable. No additional management support is needed unless otherwise documented below in the visit note. 

## 2018-03-27 ENCOUNTER — Telehealth: Payer: Self-pay | Admitting: Internal Medicine

## 2018-03-27 ENCOUNTER — Encounter (INDEPENDENT_AMBULATORY_CARE_PROVIDER_SITE_OTHER): Payer: Self-pay

## 2018-03-27 NOTE — Telephone Encounter (Signed)
Pt is scheduled for her CT with contrast on 5/10. She states last time she had to drink the contrast she got severe diarrhea while she was waiting to get her CT. She wants to know if there is something that she can take prior to keep this from upsetting her stomach. Pt cb [phone]

## 2018-03-27 NOTE — Telephone Encounter (Signed)
Please advise 

## 2018-04-01 ENCOUNTER — Encounter: Payer: Self-pay | Admitting: Internal Medicine

## 2018-04-01 NOTE — Telephone Encounter (Signed)
Patient stated she was informed she could take imodium.  Patient does not want anything for nausea.

## 2018-04-01 NOTE — Telephone Encounter (Signed)
She may want to call [facility] CT department and ask this question Does she want me to Rx medication for nausea?    [place]

## 2018-04-03 ENCOUNTER — Ambulatory Visit
Admission: RE | Admit: 2018-04-03 | Discharge: 2018-04-03 | Disposition: A | Discharge status: Home / Self Care | Payer: Medicare Other | Source: Ambulatory Visit | Attending: Internal Medicine | Admitting: Internal Medicine

## 2018-04-03 DIAGNOSIS — K432 Incisional hernia without obstruction or gangrene: Secondary | ICD-10-CM | POA: Diagnosis not present

## 2018-04-03 DIAGNOSIS — K429 Umbilical hernia without obstruction or gangrene: Secondary | ICD-10-CM | POA: Diagnosis not present

## 2018-04-03 DIAGNOSIS — K4090 Unilateral inguinal hernia, without obstruction or gangrene, not specified as recurrent: Secondary | ICD-10-CM | POA: Insufficient documentation

## 2018-04-03 DIAGNOSIS — R921 Mammographic calcification found on diagnostic imaging of breast: Secondary | ICD-10-CM | POA: Diagnosis not present

## 2018-04-03 DIAGNOSIS — K469 Unspecified abdominal hernia without obstruction or gangrene: Secondary | ICD-10-CM | POA: Diagnosis not present

## 2018-04-03 DIAGNOSIS — K5730 Diverticulosis of large intestine without perforation or abscess without bleeding: Secondary | ICD-10-CM | POA: Diagnosis not present

## 2018-04-03 LAB — POCT I-STAT CREATININE: Creatinine, Ser: 0.6 mg/dL (ref 0.44–1.00)

## 2018-04-03 IMAGING — CT CT ABD-PELV W/ CM
2 of 5 series · 16 of 46 positions shown, 18 images · IV contrast (iopamidol)
Comparison: None.

CLINICAL DATA: Abdominal hernia without obstruction.

EXAM:
CT ABDOMEN AND PELVIS WITH CONTRAST
TECHNIQUE: Multidetector CT imaging of the abdomen and pelvis was performed
using the standard protocol following bolus administration of
intravenous contrast.
CONTRAST:  100mL [VI] IOPAMIDOL ([VI]) INJECTION 61%

[Series 2: abd pelvis (person_name) · axial · 0.61mm/px · z∈[-1450,-1045]mm · 13 of 93 slices shown, 15 images (1 of 2)]
[im 6/93  soft-tissue]
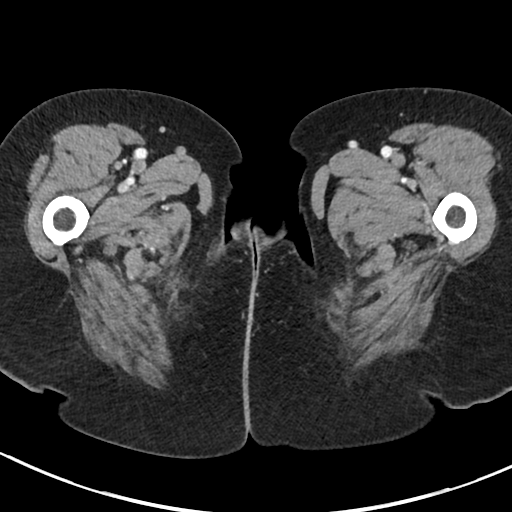
[im 6/93  bone]
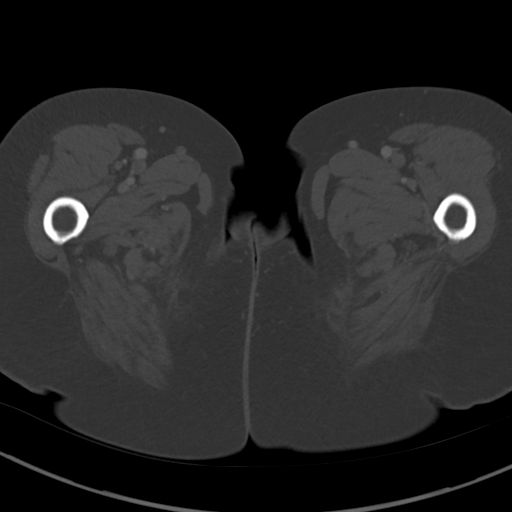
[im 11/93  soft-tissue]
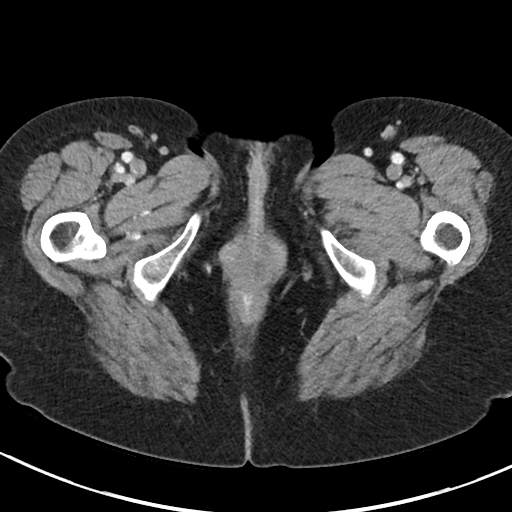
[im 22/93  soft-tissue]
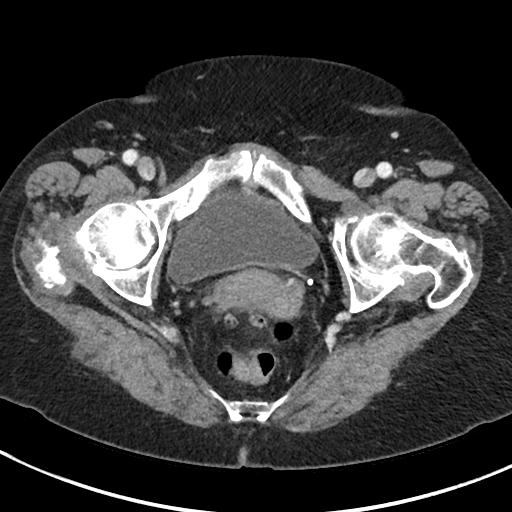
[im 28/93  soft-tissue]
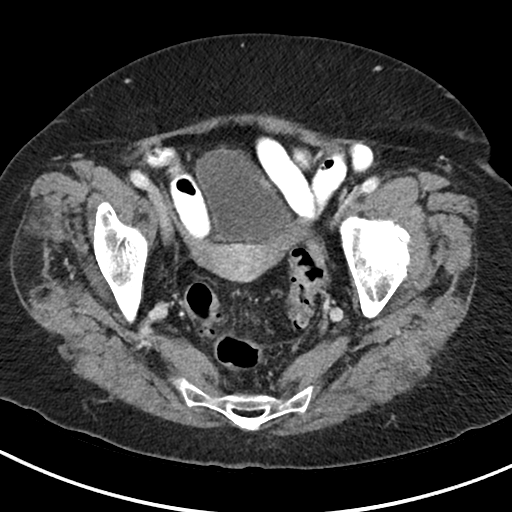
[im 33/93  soft-tissue]
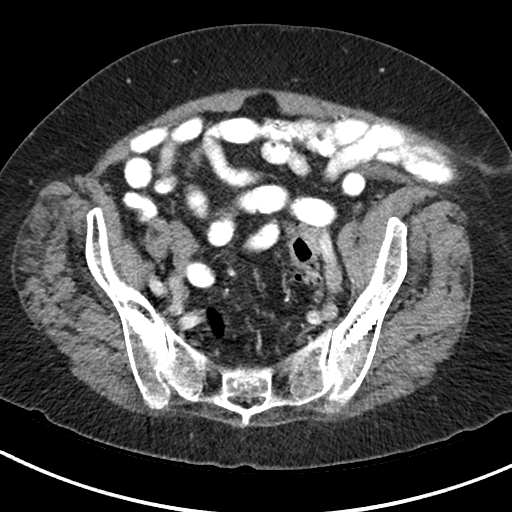
[im 38/93  soft-tissue]
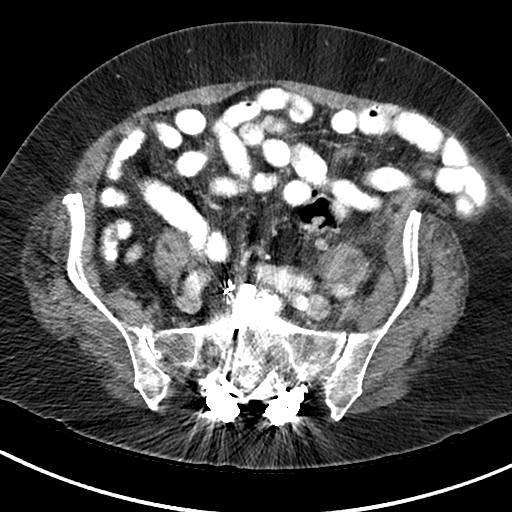
[im 49/93  soft-tissue]
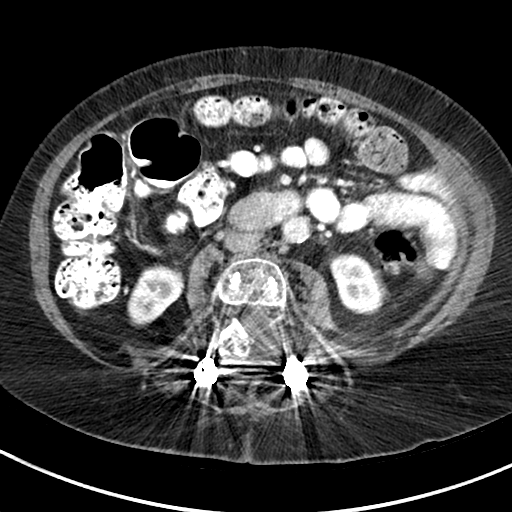
[im 55/93  soft-tissue]
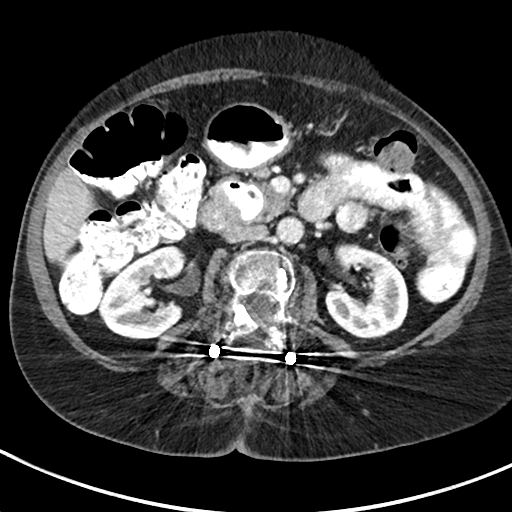
[im 60/93  soft-tissue]
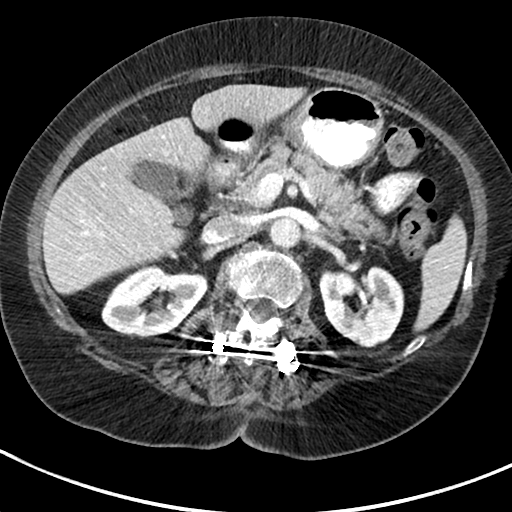
[im 60/93  bone]
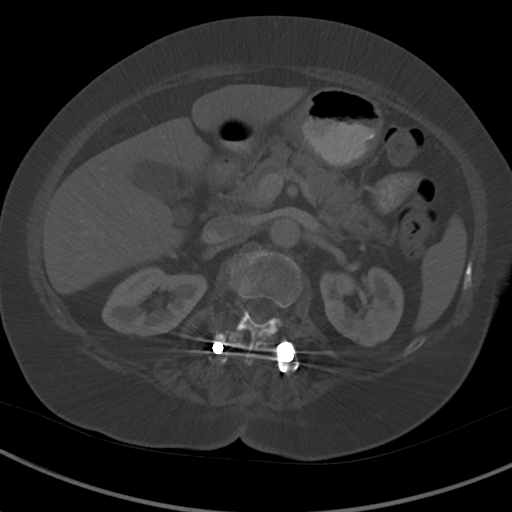
[im 65/93  soft-tissue]
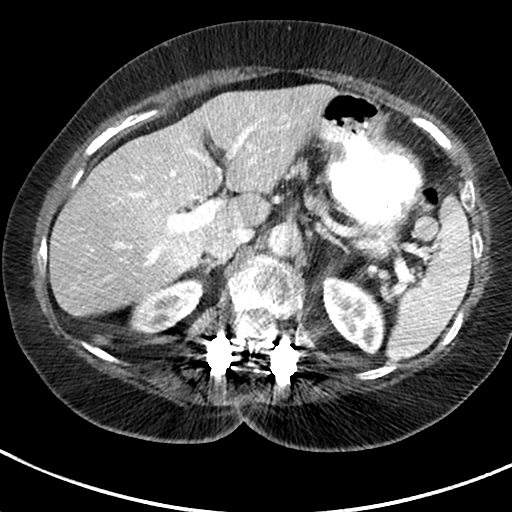
[im 71/93  soft-tissue]
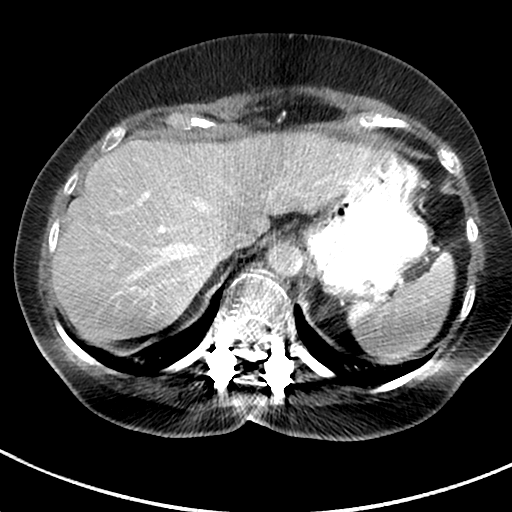
[im 82/93  soft-tissue]
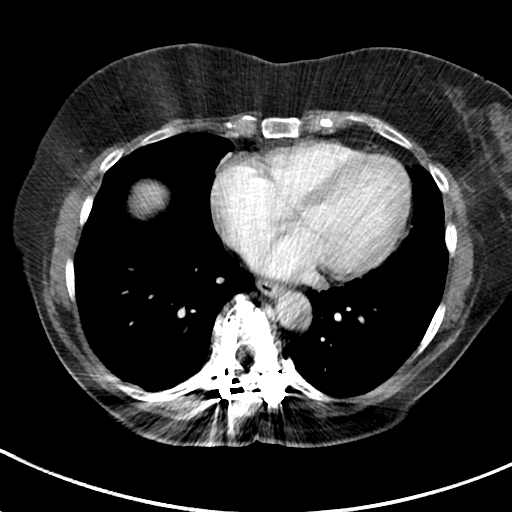
[im 87/93  soft-tissue]
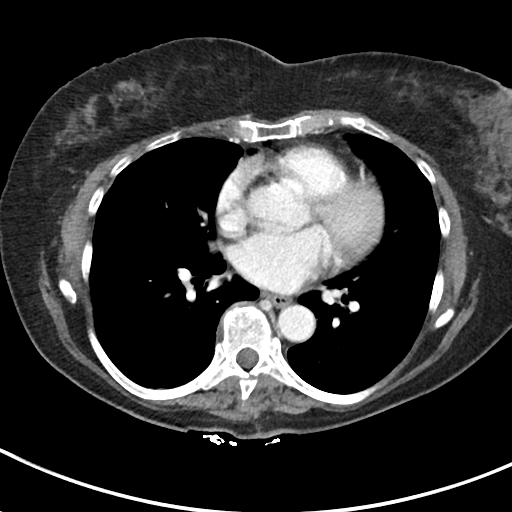

[Series 4: abd pelvis (person_name) · coronal · 0.72mm/px · 3 of 140 slices shown (2 of 2)]
[im 47/140  soft-tissue]
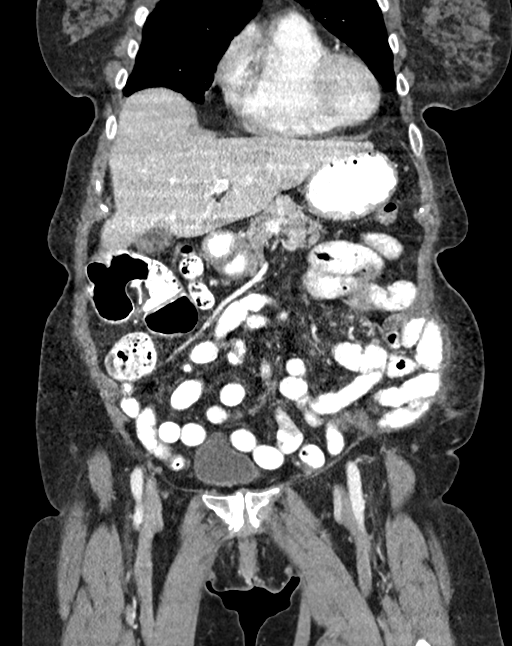
[im 62/140  soft-tissue]
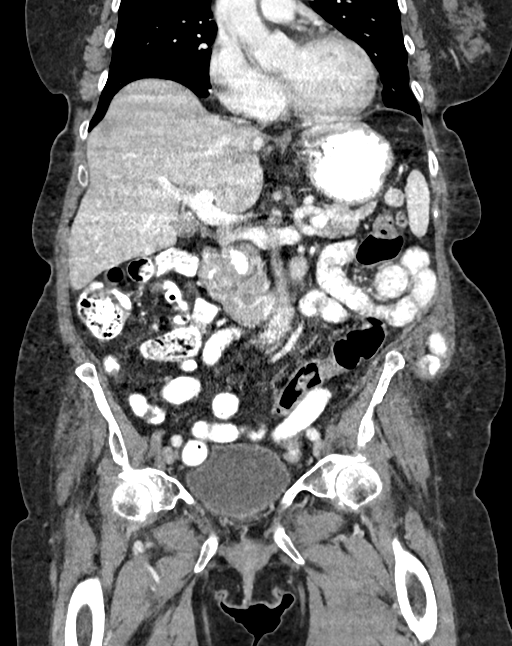
[im 78/140  soft-tissue]
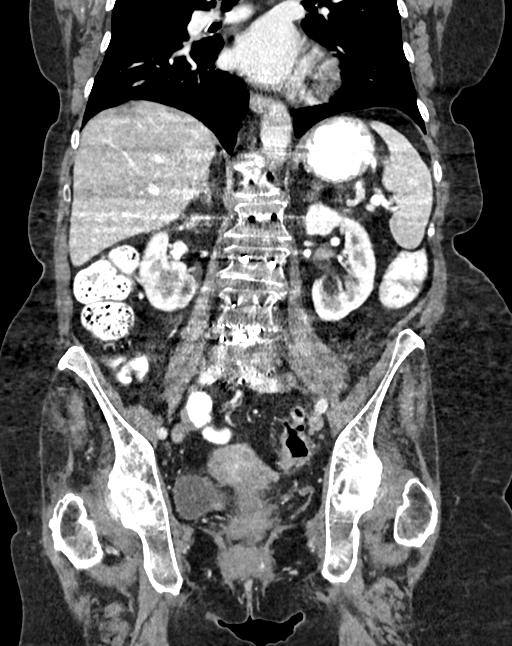

[16 of 46 positions shown; findings below may reference images not displayed]

FINDINGS: Lower chest: No evidence of active disease. There is a small fatty
Bochdalek's hernia on the right. Coarse calcification in the right
breast.

Hepatobiliary: No focal liver abnormality.No evidence of biliary
obstruction or stone.

Pancreas: Unremarkable.

Spleen: Unremarkable.

Adrenals/Urinary Tract: Negative adrenals. No hydronephrosis or
stone. Unremarkable bladder.

Stomach/Bowel: No obstruction. No appendicitis. Duodenal
diverticulum that is uncomplicated, projecting medial from the
second portion. Colonic diverticulosis at the descending and sigmoid
segments.

Vascular/Lymphatic: Overall mild atherosclerotic calcification .
Mild retroperitoneal scarring the level of the pelvis, status post
ALIF.

Reproductive:No pathologic findings.

Other: Small fatty umbilical hernia.

Shallow hernia along the left lateral abdominal wall associated with
a subcutaneous scar. Small bowel bulges through this defect. This
was likely for ALIF access.

Bowel containing left groin hernia, favored inguinal rather than
femoral given proximity to the femoral vessels and medial pointing.

Musculoskeletal: L4-5 and L5-S1 ALIF with solid arthrodesis. There
is posterior fixation hardware from T10 to the pelvis without
visible hardware failure. Bilateral hip osteoarthritis. Osteitis
pubis.
IMPRESSION: 1. Broad, shallow incisional hernia in the left flank containing
small bowel.
2. Shallow small bowel containing left groin hernia, favored
inguinal rather than femoral.
3. Small fatty umbilical hernia.
4. Distal colonic diverticulosis.
5. Coarse calcifications in the right breast, usually benign at this
size. Please ensure patient has continued mammographic follow-up
after remote lumpectomy.

## 2018-04-03 MED ORDER — IOPAMIDOL (ISOVUE-300) INJECTION 61%
100.0000 mL | Freq: Once | INTRAVENOUS | Status: AC | PRN
  Administered 2018-04-03: 100 mL via INTRAVENOUS

## 2018-04-08 ENCOUNTER — Telehealth: Payer: Self-pay | Admitting: Internal Medicine

## 2018-04-08 NOTE — Telephone Encounter (Signed)
FYI

## 2018-04-08 NOTE — Telephone Encounter (Signed)
Please advise 

## 2018-04-08 NOTE — Telephone Encounter (Signed)
Reviewed CT results and physician's note with patient. She is agreeable for surgical consult. She would like to referral to go to [facility].

## 2018-04-09 ENCOUNTER — Other Ambulatory Visit: Payer: Self-pay | Admitting: Internal Medicine

## 2018-04-09 DIAGNOSIS — K469 Unspecified abdominal hernia without obstruction or gangrene: Secondary | ICD-10-CM

## 2018-04-13 ENCOUNTER — Other Ambulatory Visit: Payer: Self-pay | Admitting: *Deleted

## 2018-04-13 ENCOUNTER — Other Ambulatory Visit: Payer: Self-pay | Admitting: Internal Medicine

## 2018-04-13 ENCOUNTER — Inpatient Hospital Stay
Admission: RE | Admit: 2018-04-13 | Discharge: 2018-04-13 | Disposition: A | Discharge status: Home / Self Care | Payer: Self-pay | Source: Ambulatory Visit | Attending: *Deleted | Admitting: *Deleted

## 2018-04-13 DIAGNOSIS — Z9289 Personal history of other medical treatment: Secondary | ICD-10-CM

## 2018-04-13 DIAGNOSIS — Z1231 Encounter for screening mammogram for malignant neoplasm of breast: Secondary | ICD-10-CM

## 2018-04-29 ENCOUNTER — Ambulatory Visit
Admission: RE | Admit: 2018-04-29 | Discharge: 2018-04-29 | Disposition: A | Discharge status: Home / Self Care | Payer: Medicare Other | Source: Ambulatory Visit | Attending: Internal Medicine | Admitting: Internal Medicine

## 2018-04-29 DIAGNOSIS — Z1231 Encounter for screening mammogram for malignant neoplasm of breast: Secondary | ICD-10-CM | POA: Diagnosis not present

## 2018-04-29 HISTORY — DX: Personal history of irradiation: Z92.3

## 2018-04-29 IMAGING — MG MM DIGITAL SCREENING BILAT W/ TOMO W/ CAD
8 series · 8 of 24 positions shown · non-contrast
Comparison: Previous exam(s).

CLINICAL DATA: Screening.

EXAM:
DIGITAL SCREENING BILATERAL MAMMOGRAM WITH TOMO AND CAD

[R CC synth-2D]
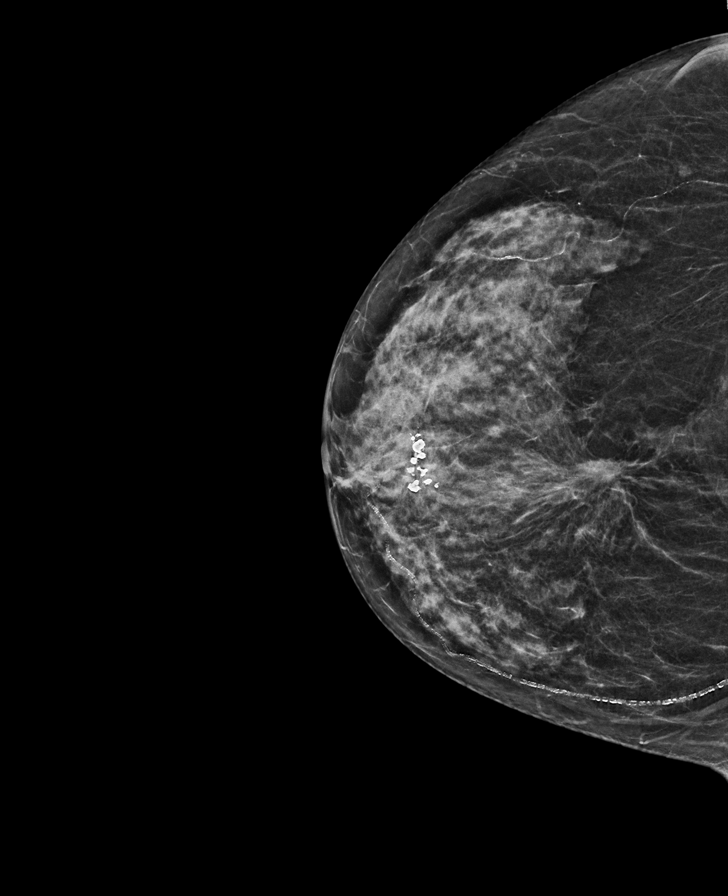

[L CC synth-2D]
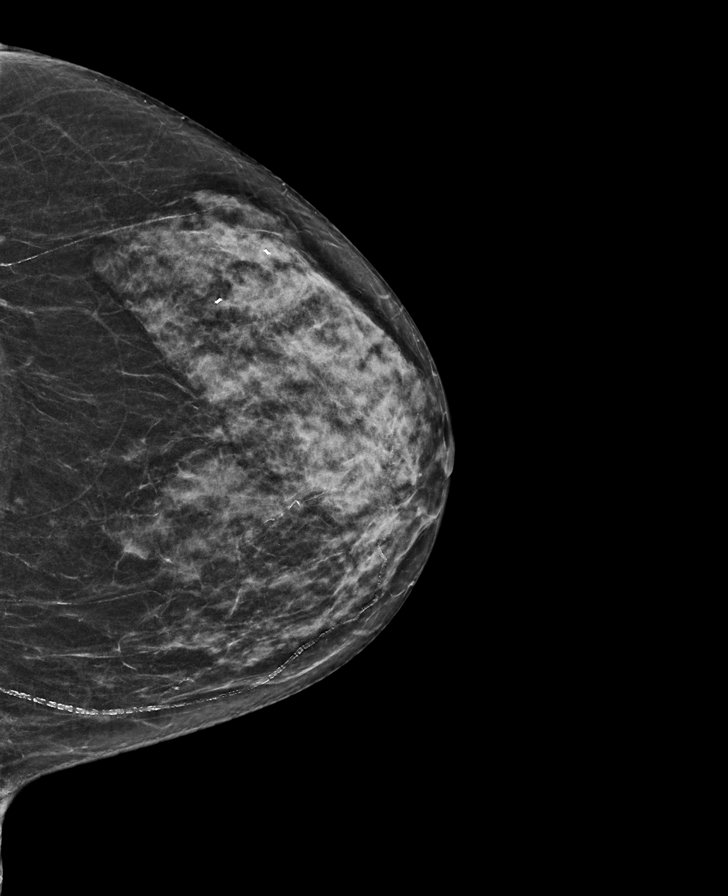

[L MLO synth-2D]
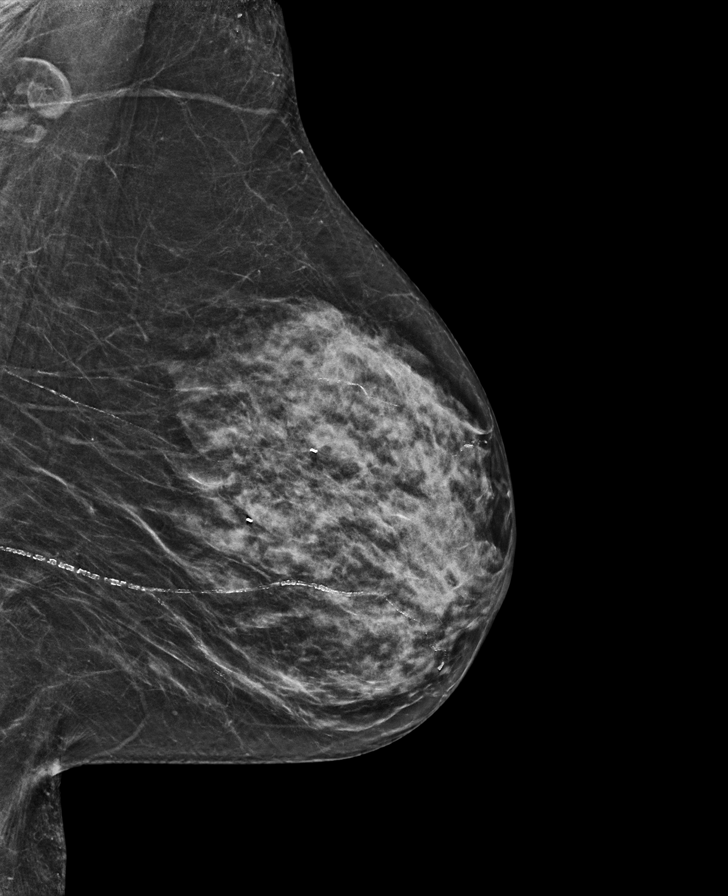

[R MLO synth-2D]
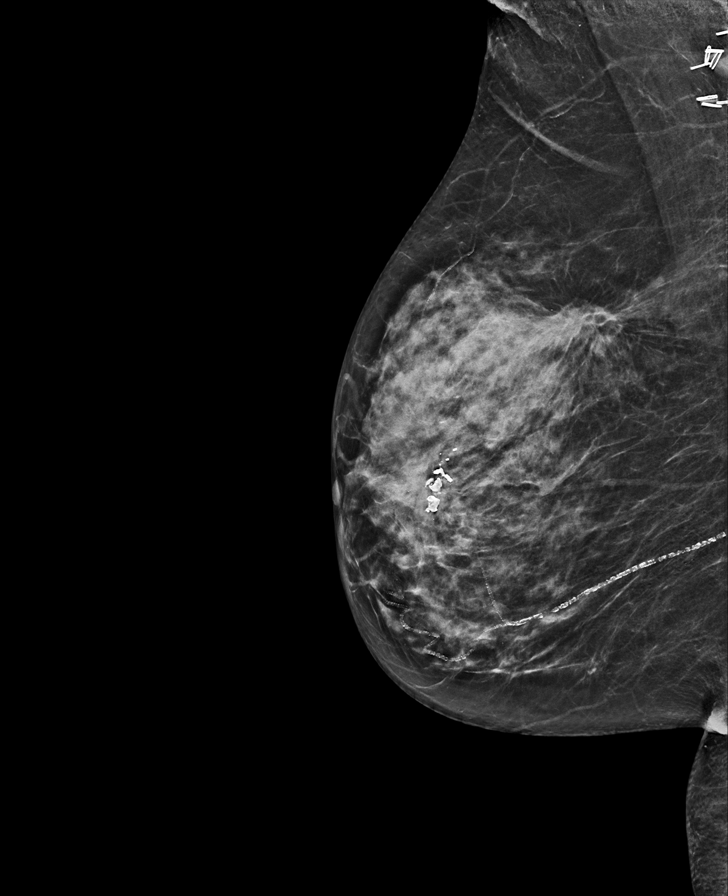

[L CC tomo · tomo slice 29/57.0]
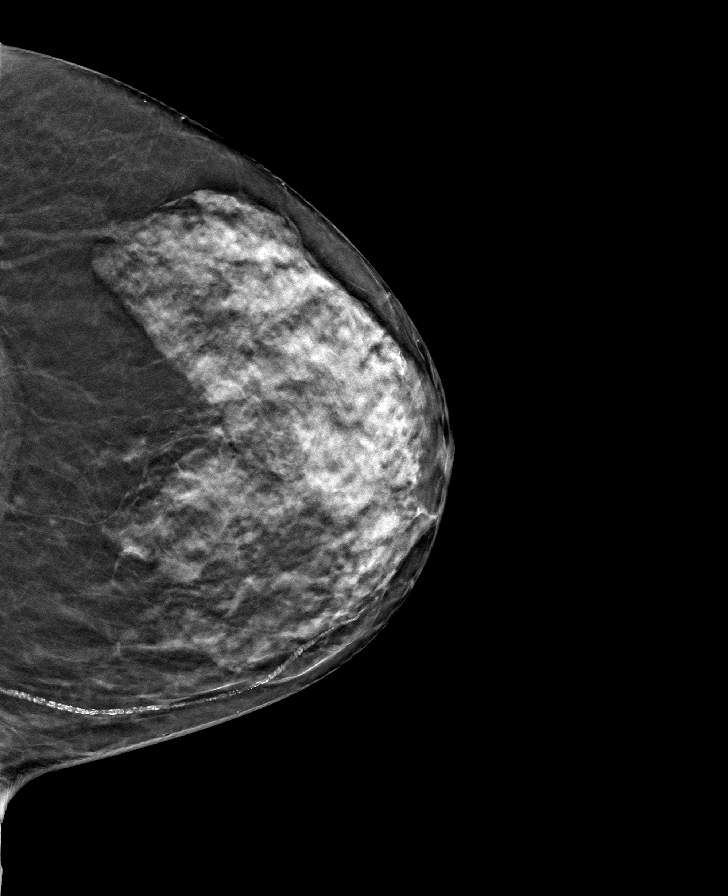

[L MLO tomo · tomo slice 29/56.0]
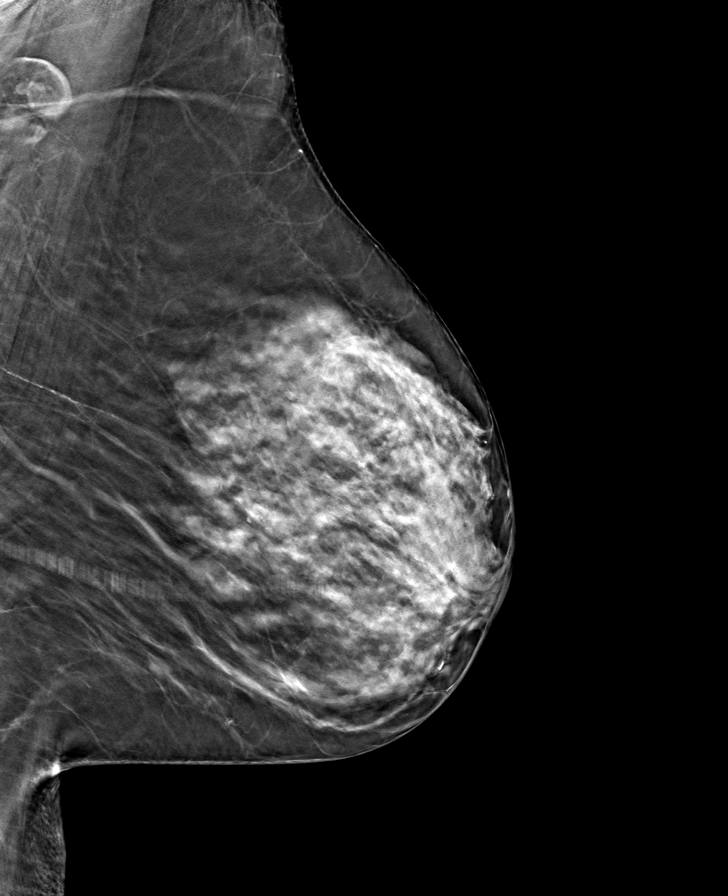

[R CC tomo · tomo slice 27/53.0]
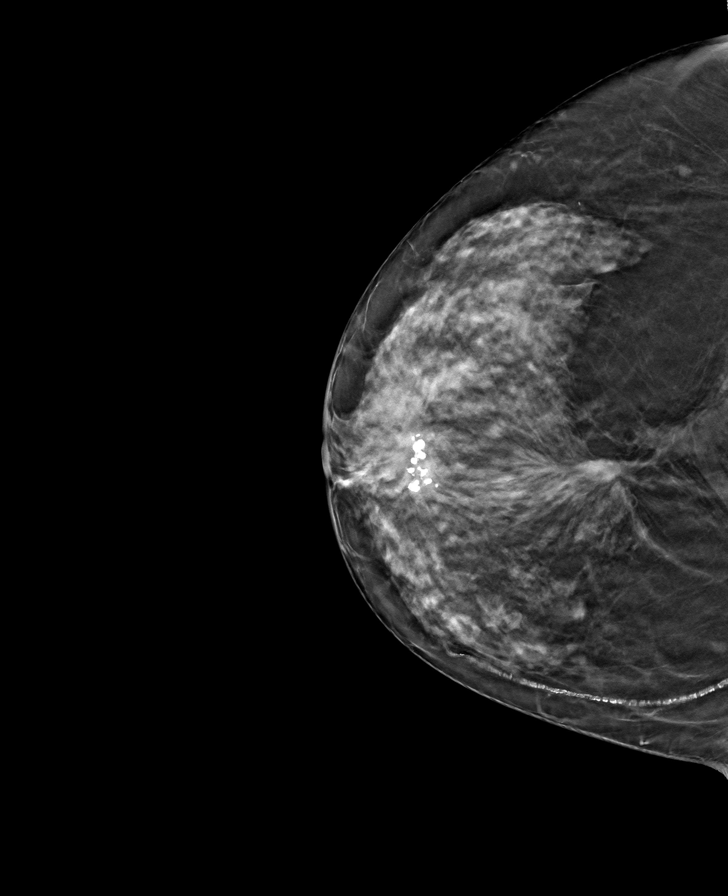

[R MLO tomo · tomo slice 28/55.0]
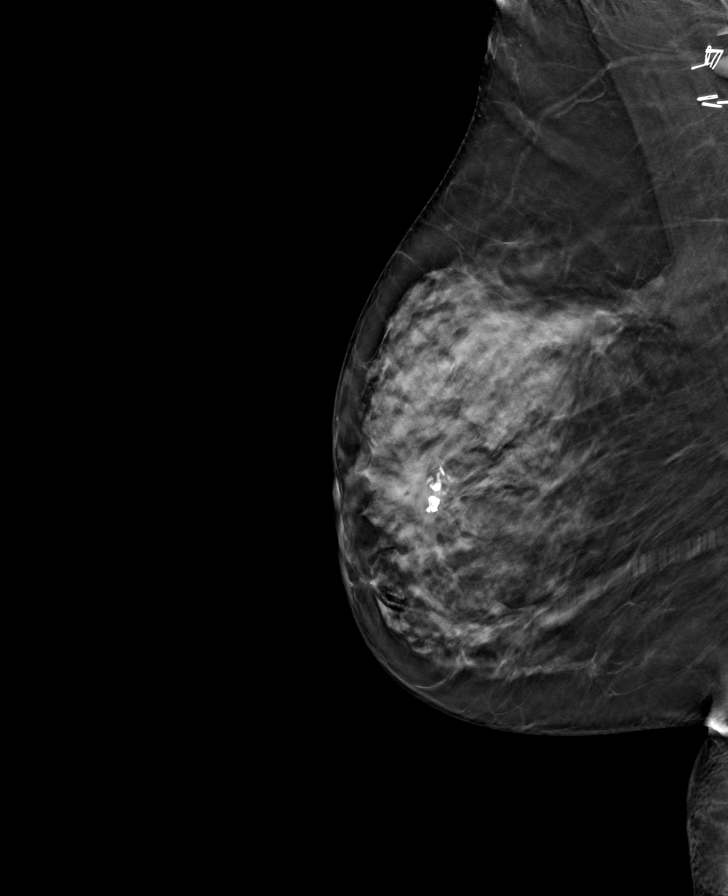

[8 of 24 positions shown; findings below may reference images not displayed]

ACR Breast Density Category c: The breast tissue is heterogeneously
dense, which may obscure small masses.
FINDINGS: There are no findings suspicious for malignancy. Images were
processed with CAD.
IMPRESSION: No mammographic evidence of malignancy. A result letter of this
screening mammogram will be mailed directly to the patient.

RECOMMENDATION:
Screening mammogram in one year. (Code:[5V])

BI-RADS CATEGORY  1: Negative.

## 2018-04-30 DIAGNOSIS — K4090 Unilateral inguinal hernia, without obstruction or gangrene, not specified as recurrent: Secondary | ICD-10-CM | POA: Diagnosis not present

## 2018-04-30 DIAGNOSIS — K458 Other specified abdominal hernia without obstruction or gangrene: Secondary | ICD-10-CM | POA: Diagnosis not present

## 2018-05-05 DIAGNOSIS — Z6826 Body mass index (BMI) 26.0-26.9, adult: Secondary | ICD-10-CM | POA: Diagnosis not present

## 2018-05-05 DIAGNOSIS — Z981 Arthrodesis status: Secondary | ICD-10-CM | POA: Diagnosis not present

## 2018-05-05 DIAGNOSIS — Z79899 Other long term (current) drug therapy: Secondary | ICD-10-CM | POA: Diagnosis not present

## 2018-05-05 DIAGNOSIS — M25511 Pain in right shoulder: Secondary | ICD-10-CM | POA: Diagnosis not present

## 2018-05-05 DIAGNOSIS — M419 Scoliosis, unspecified: Secondary | ICD-10-CM | POA: Diagnosis not present

## 2018-05-05 DIAGNOSIS — Z882 Allergy status to sulfonamides status: Secondary | ICD-10-CM | POA: Diagnosis not present

## 2018-05-05 DIAGNOSIS — M4152 Other secondary scoliosis, cervical region: Secondary | ICD-10-CM | POA: Diagnosis not present

## 2018-05-25 DIAGNOSIS — K458 Other specified abdominal hernia without obstruction or gangrene: Secondary | ICD-10-CM | POA: Diagnosis not present

## 2018-05-26 NOTE — Patient Instructions (Addendum)
Maria Spencer  05/26/2018   Your procedure is scheduled on: 05-29-18   Report to [facility] Main  Entrance    Report to Admitting at 8:00 AM    Call this number if you have problems the morning of surgery [phone]    Remember: NO SOLID FOOD AFTER MIDNIGHT THE NIGHT PRIOR TO SURGERY. NOTHING BY MOUTH EXCEPT CLEAR LIQUIDS UNTIL 3 HOURS PRIOR TO SCHEDULED SURGERY. PLEASE FINISH ENSURE DRINK PER SURGEON ORDER 3 HOURS PRIOR TO SCHEDULED SURGERY TIME WHICH NEEDS TO BE COMPLETED AT 7:00 AM.      CLEAR LIQUID DIET   Foods Allowed                                                                     Foods Excluded  Coffee and tea, regular and decaf                             liquids that you cannot  Plain Jell-O in any flavor                                             see through such as: Fruit ices (not with fruit pulp)                                     milk, soups, orange juice  Iced Popsicles                                    All solid food Carbonated beverages, regular and diet                                    Cranberry, grape and apple juices Sports drinks like Gatorade Lightly seasoned clear broth or consume(fat free) Sugar, honey syrup  Sample Menu Breakfast                                Lunch                                     Supper Cranberry juice                    Beef broth                            Chicken broth Jell-O                                     Grape juice  Apple juice Coffee or tea                        Jell-O                                      Popsicle                                                Coffee or tea                        Coffee or tea  _____________________________________________________________________    Take these medicines the morning of surgery with A SIP OF WATER: Levothyroxine                                 You may not have any metal on your body including hair pins and               piercings  Do not wear jewelry, make-up, lotions, powders or perfumes, deodorant             Do not wear nail polish.  Do not shave  48 hours prior to surgery.               Do not bring valuables to the hospital. [place].  Contacts, dentures or bridgework may not be worn into surgery.  Leave suitcase in the car. After surgery it may be brought to your room.    Special Instructions: N/A              Please read over the following fact sheets you were given: _____________________________________________________________________          [facility] - Preparing for Surgery Before surgery, you can play an important role.  Because skin is not sterile, your skin needs to be as free of germs as possible.  You can reduce the number of germs on your skin by washing with CHG (chlorahexidine gluconate) soap before surgery.  CHG is an antiseptic cleaner which kills germs and bonds with the skin to continue killing germs even after washing. Please DO NOT use if you have an allergy to CHG or antibacterial soaps.  If your skin becomes reddened/irritated stop using the CHG and inform your nurse when you arrive at Short Stay. Do not shave (including legs and underarms) for at least 48 hours prior to the first CHG shower.  You may shave your face/neck. Please follow these instructions carefully:  1.  Shower with CHG Soap the night before surgery and the  morning of Surgery.  2.  If you choose to wash your hair, wash your hair first as usual with your  normal  shampoo.  3.  After you shampoo, rinse your hair and body thoroughly to remove the  shampoo.                           4.  Use CHG as you would any other liquid soap.  You can apply chg directly  to the skin and wash                       Gently with a scrungie or clean washcloth.  5.  Apply the CHG Soap to your body ONLY FROM THE NECK DOWN.   Do not use on face/ open                            Wound or open sores. Avoid contact with eyes, ears mouth and genitals (private parts).                       Wash face,  Genitals (private parts) with your normal soap.             6.  Wash thoroughly, paying special attention to the area where your surgery  will be performed.  7.  Thoroughly rinse your body with warm water from the neck down.  8.  DO NOT shower/wash with your normal soap after using and rinsing off  the CHG Soap.                9.  Pat yourself dry with a clean towel.            10.  Wear clean pajamas.            11.  Place clean sheets on your bed the night of your first shower and do not  sleep with pets. Day of Surgery : Do not apply any lotions/deodorants the morning of surgery.  Please wear clean clothes to the hospital/surgery center.  FAILURE TO FOLLOW THESE INSTRUCTIONS MAY RESULT IN THE CANCELLATION OF YOUR SURGERY PATIENT SIGNATURE_________________________________  NURSE SIGNATURE__________________________________  ________________________________________________________________________

## 2018-05-27 ENCOUNTER — Other Ambulatory Visit: Payer: Self-pay

## 2018-05-27 ENCOUNTER — Encounter (HOSPITAL_COMMUNITY)
Admission: RE | Admit: 2018-05-27 | Discharge: 2018-05-27 | Disposition: A | Discharge status: Home / Self Care | Payer: Medicare Other | Source: Ambulatory Visit | Attending: Surgery | Admitting: Surgery

## 2018-05-27 ENCOUNTER — Encounter (HOSPITAL_COMMUNITY): Payer: Self-pay

## 2018-05-27 DIAGNOSIS — E785 Hyperlipidemia, unspecified: Secondary | ICD-10-CM | POA: Diagnosis not present

## 2018-05-27 DIAGNOSIS — I10 Essential (primary) hypertension: Secondary | ICD-10-CM | POA: Diagnosis not present

## 2018-05-27 DIAGNOSIS — E039 Hypothyroidism, unspecified: Secondary | ICD-10-CM | POA: Diagnosis not present

## 2018-05-27 DIAGNOSIS — Z853 Personal history of malignant neoplasm of breast: Secondary | ICD-10-CM | POA: Diagnosis not present

## 2018-05-27 DIAGNOSIS — Z981 Arthrodesis status: Secondary | ICD-10-CM | POA: Diagnosis not present

## 2018-05-27 DIAGNOSIS — K430 Incisional hernia with obstruction, without gangrene: Secondary | ICD-10-CM | POA: Diagnosis not present

## 2018-05-27 DIAGNOSIS — Z96611 Presence of right artificial shoulder joint: Secondary | ICD-10-CM | POA: Diagnosis not present

## 2018-05-27 DIAGNOSIS — K432 Incisional hernia without obstruction or gangrene: Secondary | ICD-10-CM | POA: Diagnosis not present

## 2018-05-27 DIAGNOSIS — Z79899 Other long term (current) drug therapy: Secondary | ICD-10-CM | POA: Diagnosis not present

## 2018-05-27 DIAGNOSIS — K219 Gastro-esophageal reflux disease without esophagitis: Secondary | ICD-10-CM | POA: Diagnosis not present

## 2018-05-27 DIAGNOSIS — G4700 Insomnia, unspecified: Secondary | ICD-10-CM | POA: Diagnosis not present

## 2018-05-27 DIAGNOSIS — K589 Irritable bowel syndrome without diarrhea: Secondary | ICD-10-CM | POA: Diagnosis not present

## 2018-05-27 DIAGNOSIS — K56609 Unspecified intestinal obstruction, unspecified as to partial versus complete obstruction: Secondary | ICD-10-CM | POA: Diagnosis not present

## 2018-05-27 DIAGNOSIS — Z923 Personal history of irradiation: Secondary | ICD-10-CM | POA: Diagnosis not present

## 2018-05-27 LAB — BASIC METABOLIC PANEL
Anion gap: 7 (ref 5–15)
BUN: 14 mg/dL (ref 8–23)
CO2: 29 mmol/L (ref 22–32)
Calcium: 9.2 mg/dL (ref 8.9–10.3)
Chloride: 103 mmol/L (ref 98–111)
Creatinine, Ser: 0.6 mg/dL (ref 0.44–1.00)
GFR calc Af Amer: >60 mL/min (ref >60)
GFR calc non Af Amer: >60 mL/min (ref >60)
Glucose, Bld: 99 mg/dL (ref 70–99)
Potassium: 4 mmol/L (ref 3.5–5.1)
Sodium: 139 mmol/L (ref 135–145)

## 2018-05-27 LAB — CBC
HCT: 39.7 % (ref 36.0–46.0)
Hemoglobin: 12.8 g/dL (ref 12.0–15.0)
MCH: 27.9 pg (ref 26.0–34.0)
MCHC: 32.2 g/dL (ref 30.0–36.0)
MCV: 86.7 fL (ref 78.0–100.0)
Platelets: 252 10*3/uL (ref 150–400)
RBC: 4.58 MIL/uL (ref 3.87–5.11)
RDW: 14.7 % (ref 11.5–15.5)
WBC: 5.9 10*3/uL (ref 4.0–10.5)

## 2018-05-27 LAB — HEMOGLOBIN A1C
Hgb A1c MFr Bld: 5.8 % — ABNORMAL HIGH (ref 4.8–5.6)
Mean Plasma Glucose: 119.76 mg/dL

## 2018-05-28 ENCOUNTER — Other Ambulatory Visit: Payer: Self-pay

## 2018-05-28 ENCOUNTER — Encounter (HOSPITAL_COMMUNITY): Payer: Self-pay

## 2018-05-28 ENCOUNTER — Inpatient Hospital Stay (HOSPITAL_COMMUNITY)
Admission: EM | Admit: 2018-05-28 | Discharge: 2018-05-30 | DRG: 352 | Disposition: A | Discharge status: Home / Self Care | Payer: Medicare Other | Attending: Surgery | Admitting: Surgery

## 2018-05-28 ENCOUNTER — Emergency Department (HOSPITAL_COMMUNITY): Payer: Medicare Other

## 2018-05-28 DIAGNOSIS — I10 Essential (primary) hypertension: Secondary | ICD-10-CM | POA: Diagnosis present

## 2018-05-28 DIAGNOSIS — K430 Incisional hernia with obstruction, without gangrene: Secondary | ICD-10-CM | POA: Diagnosis not present

## 2018-05-28 DIAGNOSIS — Z96611 Presence of right artificial shoulder joint: Secondary | ICD-10-CM | POA: Diagnosis present

## 2018-05-28 DIAGNOSIS — Z853 Personal history of malignant neoplasm of breast: Secondary | ICD-10-CM

## 2018-05-28 DIAGNOSIS — Z923 Personal history of irradiation: Secondary | ICD-10-CM | POA: Diagnosis not present

## 2018-05-28 DIAGNOSIS — Z79899 Other long term (current) drug therapy: Secondary | ICD-10-CM

## 2018-05-28 DIAGNOSIS — E039 Hypothyroidism, unspecified: Secondary | ICD-10-CM | POA: Diagnosis not present

## 2018-05-28 DIAGNOSIS — K219 Gastro-esophageal reflux disease without esophagitis: Secondary | ICD-10-CM | POA: Diagnosis present

## 2018-05-28 DIAGNOSIS — M419 Scoliosis, unspecified: Secondary | ICD-10-CM

## 2018-05-28 DIAGNOSIS — K4090 Unilateral inguinal hernia, without obstruction or gangrene, not specified as recurrent: Secondary | ICD-10-CM | POA: Diagnosis not present

## 2018-05-28 DIAGNOSIS — K4030 Unilateral inguinal hernia, with obstruction, without gangrene, not specified as recurrent: Secondary | ICD-10-CM | POA: Diagnosis not present

## 2018-05-28 DIAGNOSIS — K432 Incisional hernia without obstruction or gangrene: Secondary | ICD-10-CM | POA: Diagnosis not present

## 2018-05-28 DIAGNOSIS — E785 Hyperlipidemia, unspecified: Secondary | ICD-10-CM | POA: Diagnosis present

## 2018-05-28 DIAGNOSIS — Z981 Arthrodesis status: Secondary | ICD-10-CM

## 2018-05-28 DIAGNOSIS — G4700 Insomnia, unspecified: Secondary | ICD-10-CM | POA: Diagnosis present

## 2018-05-28 DIAGNOSIS — K589 Irritable bowel syndrome without diarrhea: Secondary | ICD-10-CM | POA: Diagnosis present

## 2018-05-28 DIAGNOSIS — K56609 Unspecified intestinal obstruction, unspecified as to partial versus complete obstruction: Secondary | ICD-10-CM | POA: Diagnosis not present

## 2018-05-28 DIAGNOSIS — K450 Other specified abdominal hernia with obstruction, without gangrene: Secondary | ICD-10-CM | POA: Diagnosis not present

## 2018-05-28 LAB — COMPREHENSIVE METABOLIC PANEL
ALT: 22 U/L (ref 0–44)
AST: 30 U/L (ref 15–41)
Albumin: 4.3 g/dL (ref 3.5–5.0)
Alkaline Phosphatase: 94 U/L (ref 38–126)
Anion gap: 14 (ref 5–15)
BUN: 19 mg/dL (ref 8–23)
CO2: 29 mmol/L (ref 22–32)
Calcium: 9.9 mg/dL (ref 8.9–10.3)
Chloride: 101 mmol/L (ref 98–111)
Creatinine, Ser: 0.66 mg/dL (ref 0.44–1.00)
GFR calc Af Amer: >60 mL/min (ref >60)
GFR calc non Af Amer: >60 mL/min (ref >60)
Glucose, Bld: 143 mg/dL — ABNORMAL HIGH (ref 70–99)
Potassium: 4.1 mmol/L (ref 3.5–5.1)
Sodium: 144 mmol/L (ref 135–145)
Total Bilirubin: 0.8 mg/dL (ref 0.3–1.2)
Total Protein: 7.4 g/dL (ref 6.5–8.1)

## 2018-05-28 LAB — LIPASE, BLOOD: Lipase: 23 U/L (ref 11–51)

## 2018-05-28 LAB — SURGICAL PCR SCREEN
MRSA, PCR: NEGATIVE
Staphylococcus aureus: NEGATIVE

## 2018-05-28 LAB — URINALYSIS, ROUTINE W REFLEX MICROSCOPIC
Bacteria, UA: NONE SEEN
Bilirubin Urine: NEGATIVE
Glucose, UA: NEGATIVE mg/dL
Hgb urine dipstick: NEGATIVE
Ketones, ur: 20 mg/dL — AB
Nitrite: POSITIVE — AB
Protein, ur: NEGATIVE mg/dL
Specific Gravity, Urine: >1.046 — ABNORMAL HIGH (ref 1.005–1.030)
pH: 6 (ref 5.0–8.0)

## 2018-05-28 LAB — CBC
HCT: 45 % (ref 36.0–46.0)
Hemoglobin: 14.8 g/dL (ref 12.0–15.0)
MCH: 28.4 pg (ref 26.0–34.0)
MCHC: 32.9 g/dL (ref 30.0–36.0)
MCV: 86.4 fL (ref 78.0–100.0)
Platelets: 257 10*3/uL (ref 150–400)
RBC: 5.21 MIL/uL — ABNORMAL HIGH (ref 3.87–5.11)
RDW: 14.6 % (ref 11.5–15.5)
WBC: 13.4 10*3/uL — ABNORMAL HIGH (ref 4.0–10.5)

## 2018-05-28 IMAGING — CT CT ABD-PELV W/ CM
2 of 5 series · 15 of 46 positions shown, 17 images · IV contrast (ISOVUE)
Comparison: [HOSPITAL] CT Abdomen and Pelvis [DATE].

CLINICAL DATA: 71-year-old female with 2 weeks of progressive left
lower quadrant abdominal pain. Left lower quadrant small bowel
containing hernia. Nausea vomiting. Remote history of breast cancer
([MW]).

EXAM:
CT ABDOMEN AND PELVIS WITH CONTRAST
TECHNIQUE: Multidetector CT imaging of the abdomen and pelvis was performed
using the standard protocol following bolus administration of
intravenous contrast.
CONTRAST:  100mL [MW] IOPAMIDOL ([MW]) INJECTION 61%

[Series 2: axial st · axial · 0.71mm/px · z∈[-334,-4]mm · 12 of 78 slices shown, 14 images]
[im 6/78  soft-tissue]
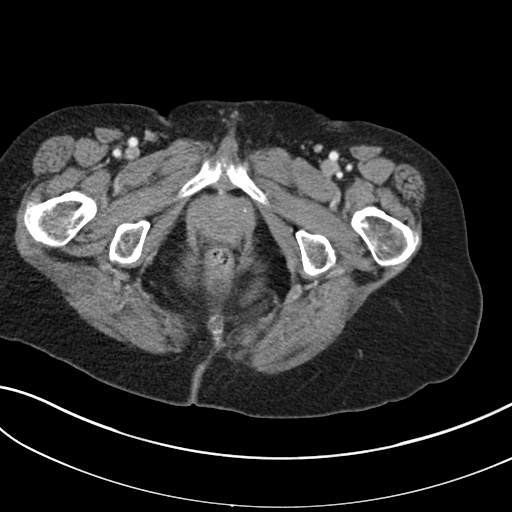
[im 6/78  bone]
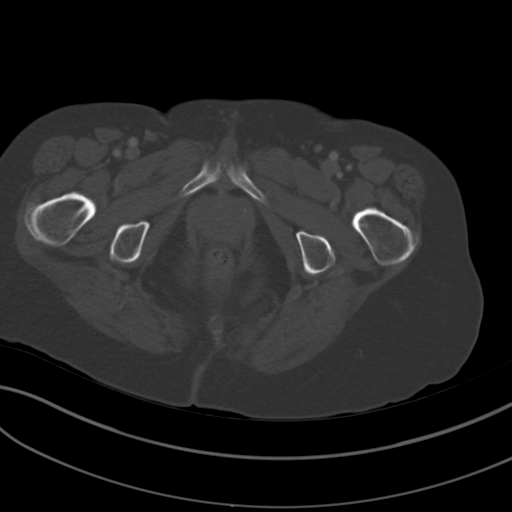
[im 11/78  soft-tissue]
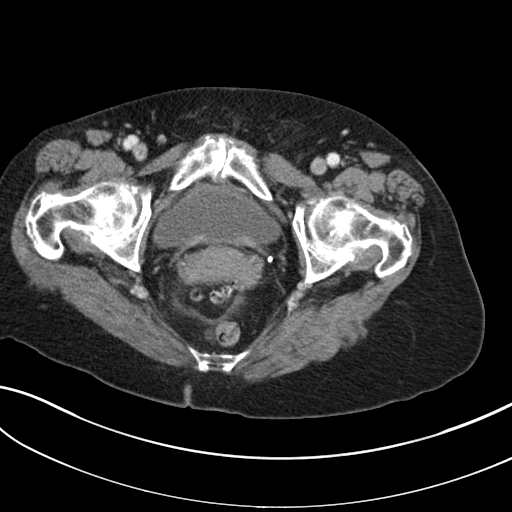
[im 16/78  soft-tissue]
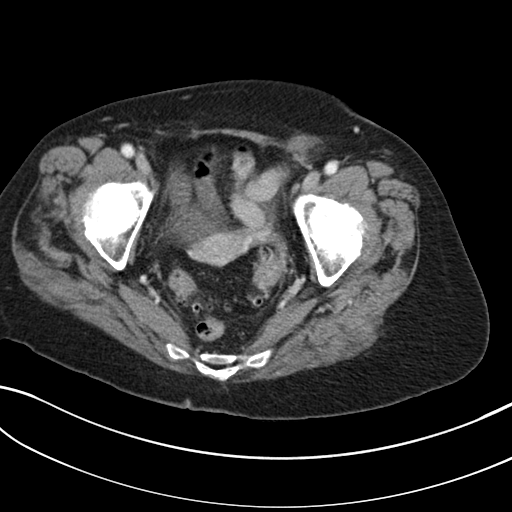
[im 26/78  soft-tissue]
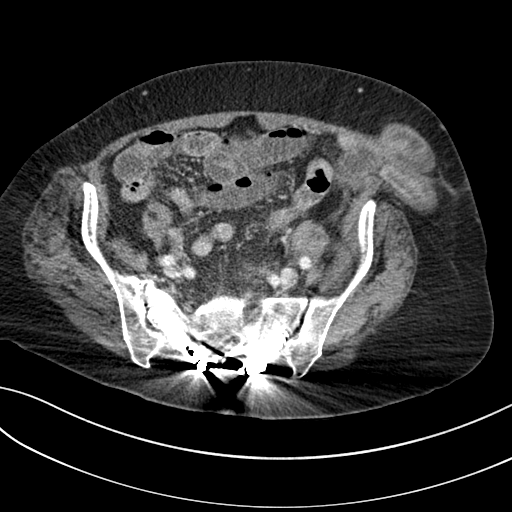
[im 31/78  soft-tissue]
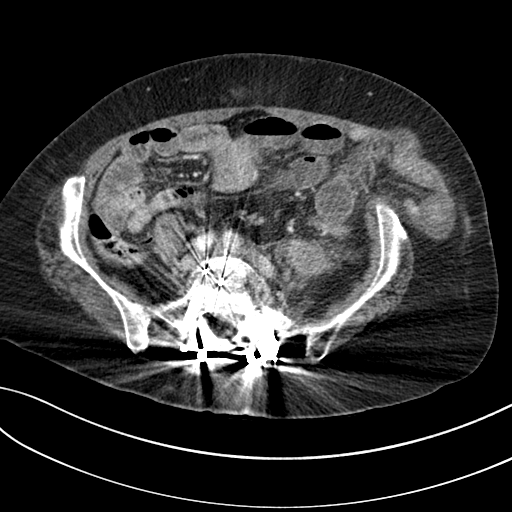
[im 36/78  soft-tissue]
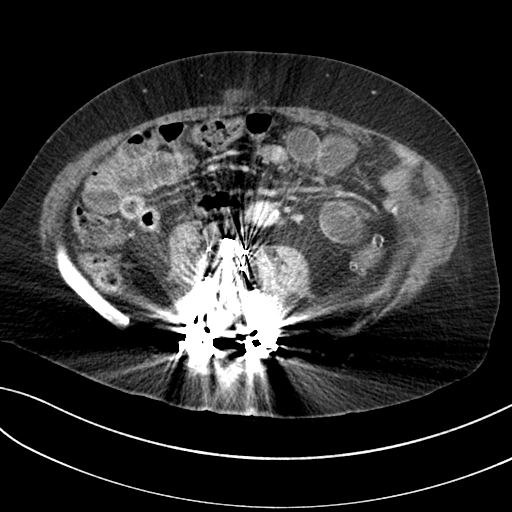
[im 42/78  soft-tissue]
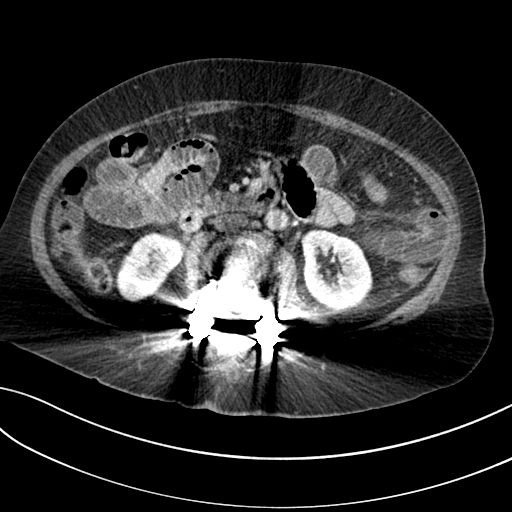
[im 47/78  soft-tissue]
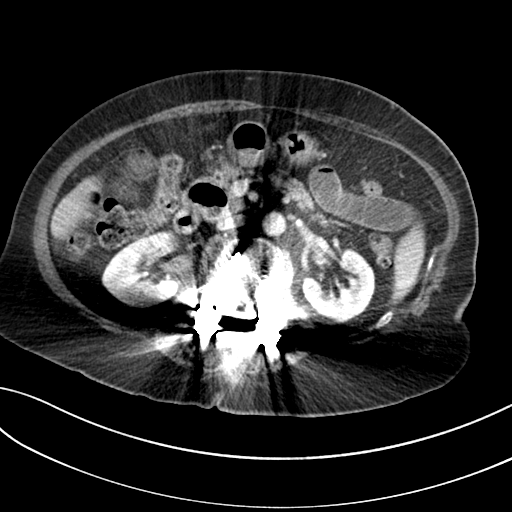
[im 52/78  soft-tissue]
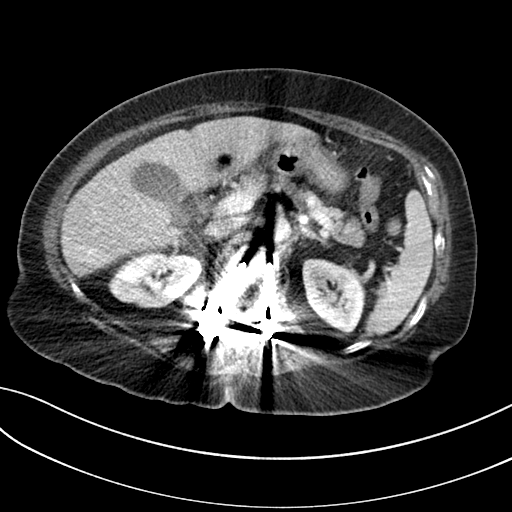
[im 52/78  bone]
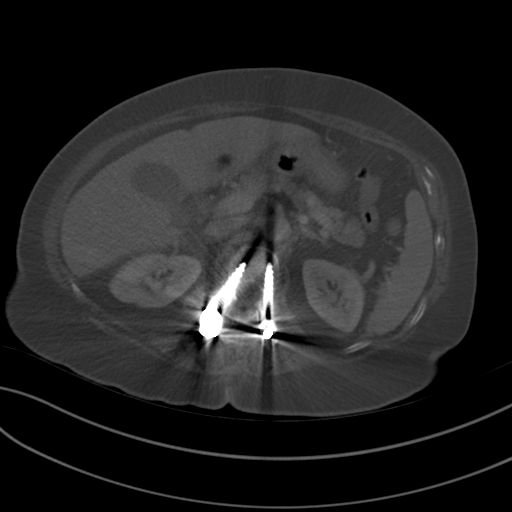
[im 62/78  soft-tissue]
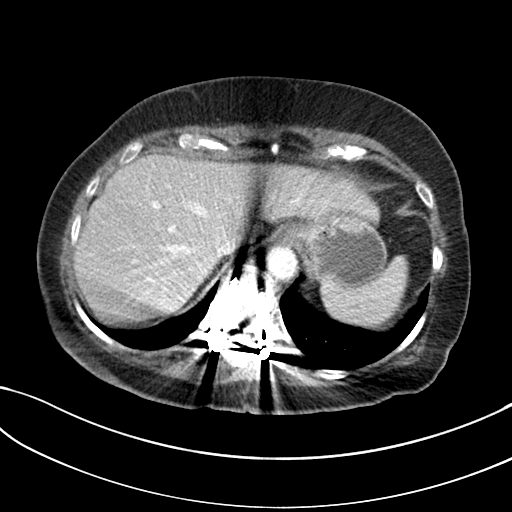
[im 67/78  soft-tissue]
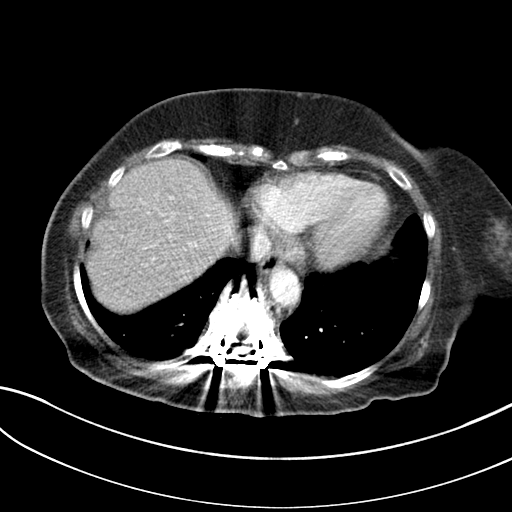
[im 72/78  soft-tissue]
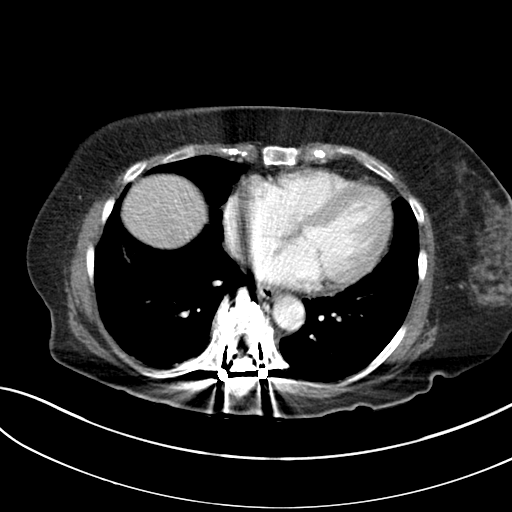

[Series 4: coronal st · coronal · 0.74mm/px · 3 of 81 slices shown]
[im 27/81  soft-tissue]
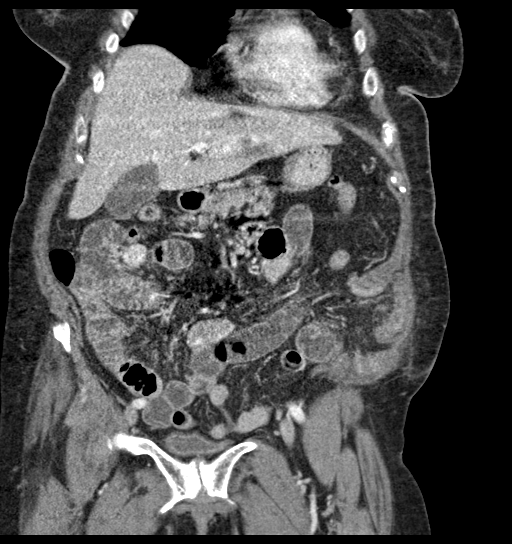
[im 36/81  soft-tissue]
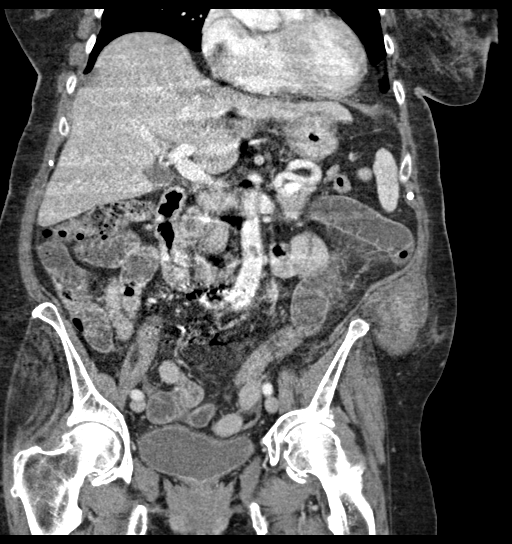
[im 45/81  soft-tissue]
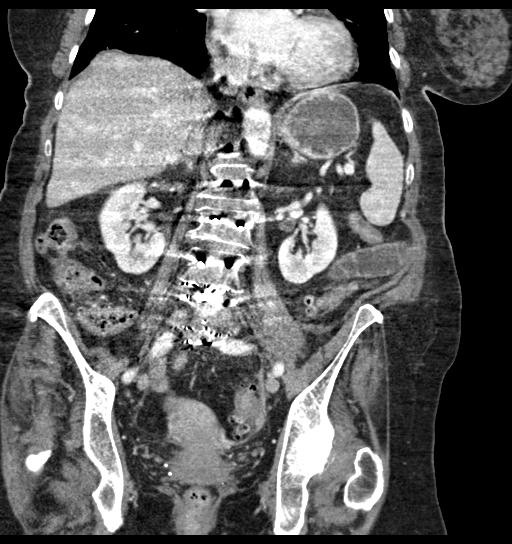

[15 of 46 positions shown; findings below may reference images not displayed]

FINDINGS: Lower chest: No pericardial or pleural effusion. Mild lung base
atelectasis or scarring is stable.

Hepatobiliary: Negative liver and gallbladder.

Pancreas: Negative.

Spleen: Negative.

Adrenals/Urinary Tract: Adrenal glands and kidneys appear stable and
negative with symmetric renal contrast enhancement and excretion.

Diminutive and unremarkable urinary bladder.

Stomach/Bowel: Decompressed rectosigmoid colon. Severe
diverticulosis of the sigmoid, and moderate diverticulosis of the
descending colon. There is trace free fluid in the left pericolic
gutter (series 2, image 35). The distal transverse colon is also
decompressed with mild diverticulosis.

The proximal transverse and right colon are normal. The appendix is
normal on series 2, image 49.

The distal small bowel is mostly decompressed. No oral contrast was
administered. The stomach, duodenum, and proximal jejunum are
decompressed.

Along the left lateral flank there is an abdominal wall spigelian
hernia containing small bowel loops which appear incarcerated and
inflamed (series 2, image 50, coronal image 21 and 34). Loops
upstream of this hernia are mildly dilated and fluid-filled. The
immediate downstream loops are decompressed. The hernia appears
incarcerated despite a relatively broad neck. Mesentery extends into
the hernia as seen on coronal image 29.

A 2nd hernia at the left inguinal level no longer contains small
bowel, but does contain a small volume of fluid or stranding on
series 2, image 65.

There is a 3rd small fat containing infraumbilical hernia which is
stable.

No abdominal free air. No other abdominal free fluid.

Vascular/Lymphatic: Aortoiliac calcified atherosclerosis. Major
arterial structures are patent. Portal venous system is patent.

No lymphadenopathy.

Reproductive: Negative.

Other: Trace pelvic free fluid (series 2, image 68).

Musculoskeletal: Extensive thoracic, lumbar and sacral/pelvic
posterior spinal fusion hardware with streak artifact. Previous
lumbar posterior decompression. Stable visualized osseous
structures.
IMPRESSION: 1. Acute Small Bowel Obstruction due to a left lateral flank hernia
containing small bowel which appear incarcerated/inflamed.
2. Trace free fluid in the left gutter and pelvis.  No free air.
3. No other acute or inflammatory process. Small left inguinal
hernia which no longer contains small bowel. Small fat containing
infraumbilical hernia. Extensive diverticulosis of the distal colon.

## 2018-05-28 MED ORDER — CEFAZOLIN SODIUM-DEXTROSE 2-4 GM/100ML-% IV SOLN
2.0000 g | INTRAVENOUS | Status: DC
  Filled 2018-05-28: qty 100

## 2018-05-28 MED ORDER — METOPROLOL TARTRATE 5 MG/5ML IV SOLN: 5.0000 mg | Freq: Four times a day (QID) | INTRAVENOUS | Status: DC | PRN

## 2018-05-28 MED ORDER — CHLORHEXIDINE GLUCONATE CLOTH 2 % EX PADS: 6.0000 | MEDICATED_PAD | Freq: Once | CUTANEOUS | Status: DC

## 2018-05-28 MED ORDER — DIPHENHYDRAMINE HCL 50 MG/ML IJ SOLN: 12.5000 mg | Freq: Four times a day (QID) | INTRAMUSCULAR | Status: DC | PRN

## 2018-05-28 MED ORDER — HEPARIN SODIUM (PORCINE) 5000 UNIT/ML IJ SOLN: 5000.0000 [IU] | INTRAMUSCULAR | Status: DC

## 2018-05-28 MED ORDER — LACTATED RINGERS IV BOLUS: 1000.0000 mL | Freq: Three times a day (TID) | INTRAVENOUS | Status: DC | PRN

## 2018-05-28 MED ORDER — HYDROCORTISONE 2.5 % RE CREA: 1.0000 "application " | TOPICAL_CREAM | Freq: Four times a day (QID) | RECTAL | Status: DC | PRN

## 2018-05-28 MED ORDER — MORPHINE SULFATE (PF) 4 MG/ML IV SOLN
4.0000 mg | Freq: Once | INTRAVENOUS | Status: AC
  Administered 2018-05-28: 4 mg via INTRAVENOUS
  Filled 2018-05-28: qty 1

## 2018-05-28 MED ORDER — ALUM & MAG HYDROXIDE-SIMETH 200-200-20 MG/5ML PO SUSP: 30.0000 mL | Freq: Four times a day (QID) | ORAL | Status: DC | PRN

## 2018-05-28 MED ORDER — ONDANSETRON 4 MG PO TBDP
4.0000 mg | ORAL_TABLET | Freq: Four times a day (QID) | ORAL | Status: DC | PRN
  Administered 2018-05-30: 4 mg via ORAL
  Filled 2018-05-28: qty 1

## 2018-05-28 MED ORDER — ONDANSETRON HCL 4 MG/2ML IJ SOLN
4.0000 mg | Freq: Once | INTRAMUSCULAR | Status: AC
  Administered 2018-05-28: 4 mg via INTRAVENOUS
  Filled 2018-05-28: qty 2

## 2018-05-28 MED ORDER — ATORVASTATIN CALCIUM 10 MG PO TABS: 10.0000 mg | ORAL_TABLET | Freq: Every day | ORAL | Status: DC

## 2018-05-28 MED ORDER — GABAPENTIN 300 MG PO CAPS: 300.0000 mg | ORAL_CAPSULE | ORAL | Status: DC

## 2018-05-28 MED ORDER — SIMETHICONE 80 MG PO CHEW: 40.0000 mg | CHEWABLE_TABLET | Freq: Four times a day (QID) | ORAL | Status: DC | PRN

## 2018-05-28 MED ORDER — LACTATED RINGERS IV BOLUS: 1000.0000 mL | Freq: Once | INTRAVENOUS | Status: DC

## 2018-05-28 MED ORDER — IOPAMIDOL (ISOVUE-300) INJECTION 61%
100.0000 mL | Freq: Once | INTRAVENOUS | Status: AC | PRN
  Administered 2018-05-28: 100 mL via INTRAVENOUS

## 2018-05-28 MED ORDER — LISINOPRIL 20 MG PO TABS
20.0000 mg | ORAL_TABLET | Freq: Every day | ORAL | Status: DC
  Administered 2018-05-28 – 2018-05-30 (×2): 20 mg via ORAL
  Filled 2018-05-28 (×2): qty 1

## 2018-05-28 MED ORDER — OXYCODONE HCL 5 MG PO TABS
5.0000 mg | ORAL_TABLET | Freq: Two times a day (BID) | ORAL | Status: DC | PRN
  Administered 2018-05-30: 5 mg via ORAL
  Filled 2018-05-28: qty 1

## 2018-05-28 MED ORDER — BUPIVACAINE LIPOSOME 1.3 % IJ SUSP
20.0000 mL | Freq: Once | INTRAMUSCULAR | Status: DC
  Filled 2018-05-28: qty 20

## 2018-05-28 MED ORDER — ONDANSETRON HCL 4 MG/2ML IJ SOLN: 4.0000 mg | Freq: Four times a day (QID) | INTRAMUSCULAR | Status: DC | PRN

## 2018-05-28 MED ORDER — SODIUM CHLORIDE 0.9 % IV BOLUS
1000.0000 mL | Freq: Once | INTRAVENOUS | Status: AC
  Administered 2018-05-28: 1000 mL via INTRAVENOUS

## 2018-05-28 MED ORDER — FLUTICASONE PROPIONATE 50 MCG/ACT NA SUSP: 1.0000 | Freq: Every day | NASAL | Status: DC | PRN

## 2018-05-28 MED ORDER — BISACODYL 10 MG RE SUPP: 10.0000 mg | Freq: Two times a day (BID) | RECTAL | Status: DC | PRN

## 2018-05-28 MED ORDER — ADULT MULTIVITAMIN W/MINERALS CH: 1.0000 | ORAL_TABLET | Freq: Every day | ORAL | Status: DC

## 2018-05-28 MED ORDER — LEVOTHYROXINE SODIUM 88 MCG PO TABS
88.0000 ug | ORAL_TABLET | Freq: Every day | ORAL | Status: DC
  Administered 2018-05-30: 88 ug via ORAL
  Filled 2018-05-28: qty 1

## 2018-05-28 MED ORDER — ENOXAPARIN SODIUM 40 MG/0.4ML ~~LOC~~ SOLN: 40.0000 mg | SUBCUTANEOUS | Status: DC

## 2018-05-28 MED ORDER — ACETAMINOPHEN 325 MG PO TABS: 650.0000 mg | ORAL_TABLET | Freq: Four times a day (QID) | ORAL | Status: DC | PRN

## 2018-05-28 MED ORDER — HYDROCORTISONE 1 % EX CREA: 1.0000 "application " | TOPICAL_CREAM | Freq: Three times a day (TID) | CUTANEOUS | Status: DC | PRN

## 2018-05-28 MED ORDER — LACTATED RINGERS IV SOLN
INTRAVENOUS | Status: DC
  Administered 2018-05-28 – 2018-05-29 (×3): via INTRAVENOUS

## 2018-05-28 MED ORDER — ACETAMINOPHEN 500 MG PO TABS: 1000.0000 mg | ORAL_TABLET | ORAL | Status: DC

## 2018-05-28 MED ORDER — MAGIC MOUTHWASH: 15.0000 mL | Freq: Four times a day (QID) | ORAL | Status: DC | PRN

## 2018-05-28 MED ORDER — IOPAMIDOL (ISOVUE-300) INJECTION 61%
INTRAVENOUS | Status: AC
  Filled 2018-05-28: qty 100

## 2018-05-28 MED ORDER — DIPHENHYDRAMINE HCL 12.5 MG/5ML PO ELIX: 12.5000 mg | ORAL_SOLUTION | Freq: Four times a day (QID) | ORAL | Status: DC | PRN

## 2018-05-28 MED ORDER — HEPARIN SODIUM (PORCINE) 5000 UNIT/ML IJ SOLN
5000.0000 [IU] | Freq: Once | INTRAMUSCULAR | Status: DC
  Filled 2018-05-28: qty 1

## 2018-05-28 MED ORDER — ACETAMINOPHEN 650 MG RE SUPP: 650.0000 mg | Freq: Four times a day (QID) | RECTAL | Status: DC | PRN

## 2018-05-28 MED ORDER — ENALAPRILAT 1.25 MG/ML IV SOLN
0.6250 mg | Freq: Four times a day (QID) | INTRAVENOUS | Status: DC | PRN
  Filled 2018-05-28: qty 1

## 2018-05-28 MED ORDER — PHENOL 1.4 % MT LIQD: 1.0000 | OROMUCOSAL | Status: DC | PRN

## 2018-05-28 MED ORDER — METOCLOPRAMIDE HCL 5 MG/ML IJ SOLN: 10.0000 mg | Freq: Four times a day (QID) | INTRAMUSCULAR | Status: DC | PRN

## 2018-05-28 MED ORDER — LIP MEDEX EX OINT: 1.0000 "application " | TOPICAL_OINTMENT | Freq: Two times a day (BID) | CUTANEOUS | Status: DC

## 2018-05-28 MED ORDER — MENTHOL 3 MG MT LOZG: 1.0000 | LOZENGE | OROMUCOSAL | Status: DC | PRN

## 2018-05-28 MED ORDER — METHOCARBAMOL 1000 MG/10ML IJ SOLN
1000.0000 mg | Freq: Four times a day (QID) | INTRAMUSCULAR | Status: DC | PRN
  Filled 2018-05-28: qty 10

## 2018-05-28 MED ORDER — ZOLPIDEM TARTRATE 5 MG PO TABS
5.0000 mg | ORAL_TABLET | Freq: Every evening | ORAL | Status: DC | PRN
  Administered 2018-05-28: 5 mg via ORAL
  Filled 2018-05-28: qty 1

## 2018-05-28 MED ORDER — PROCHLORPERAZINE EDISYLATE 10 MG/2ML IJ SOLN: 5.0000 mg | INTRAMUSCULAR | Status: DC | PRN

## 2018-05-28 MED ORDER — GUAIFENESIN-DM 100-10 MG/5ML PO SYRP: 10.0000 mL | ORAL_SOLUTION | ORAL | Status: DC | PRN

## 2018-05-28 NOTE — ED Provider Notes (Signed)
[facility] DEPT Provider Note   CSN: [id number] Arrival date & time: 05/28/18  1304     History   Chief Complaint Chief Complaint  Patient presents with  . Emesis  . Abdominal Pain    HPI Maria Spencer is a 72 y.o. female with history of breast cancer, UTI, diverticulosis, GERD, HLD, HTN, hypothyroidism, and arthritis presents today for evaluation of acute onset, per aggressively worsening left-sided abdominal pain since yesterday.  Patient has known hernias to the left side of the abdomen; one is an incisional hernia on the left flank, the other is a small bowel containing left groin hernia thought to be inguinal rather than femoral.  She is scheduled for repair with Dr. Hassell Done tomorrow.  She states that she has had worsening pain of the left side of the abdomen over the past month but the pain acutely worsened last night and she was unable to sleep due to the pain which is unusual for her.  She states that pain is aching and throbbing at baseline but will sometimes be sharp and stabbing specifically with position changes and movement.  Pain radiates to the low back.  This morning when she awoke she had 10-15 episodes of nonbloody nonbilious emesis.  Last vomited 1 hour ago.  Endorses ongoing nausea.  She denies fevers, chills, chest pain, shortness of breath, or urinary symptoms.  She denies any constipation but states that she has been dealing with loose stools for the past month.  Has not had a bowel movement today.  She took 2 tablets of oxycodone last night without relief of her symptoms.  The history is provided by the patient.    Past Medical History:  Diagnosis Date  . Arthritis    neck, back, left knee;   . Breast cancer [facility]) 2009   right lumpectomy s/p radiation   . Chicken pox   . Chronic UTI    established with urology   . Diverticular disease    -osis and -itis   . GERD (gastroesophageal reflux disease)   . Hormone disorder   .  Hyperlipidemia   . Hypertension    controlled well with medication;   . Personal history of radiation therapy 2009   F/U right breast cancer  . Thyroid disease    hypothyroidism     Patient Active Problem List   Diagnosis Date Noted  . Prediabetes 03/26/2018  . Annual visit for general adult medical examination with abnormal findings 03/26/2018  . Iron deficiency anemia 03/26/2018  . Unilateral inguinal hernia without obstruction or gangrene 03/26/2018  . Abdominal hernia without obstruction and without gangrene 03/26/2018  . Breast cancer ([facility]) 01/07/2018  . GERD (gastroesophageal reflux disease) 01/07/2018  . Insomnia 01/07/2018  . Migraine 01/07/2018  . Osteopenia 01/07/2018  . Arthritis 01/07/2018  . Chronic UTI 12/31/2017  . IBS (irritable bowel syndrome) 12/29/2017  . History of breast cancer 12/29/2017  . Hypothyroidism 12/29/2017  . Baker's cyst of knee, left 12/29/2017  . HTN (hypertension) 12/29/2017  . HLD (hyperlipidemia) 12/29/2017  . Nausea 12/29/2017  . Abdominal pain 12/29/2017  . Scoliosis of cervical region due to degenerative disease of spine in adult 09/18/2017  . Kidney stones 03/29/2015    Past Surgical History:  Procedure Laterality Date  . BONE GRAFT HIP ILIAC CREST     + cage left hip 10/23/17   . BREAST BIOPSY Left 2009   clip,benign  . BREAST BIOPSY Right 2009   +  . BREAST LUMPECTOMY  Right 2009   2009 lumpectomy   . SHOULDER SURGERY     x2 surgeries both shoulders, right shoulder replacement last in 2012   . SPINE SURGERY     spinal 09/2017 h/o scoliosis   . TONSILLECTOMY     age 37 y.o.   . TOTAL SHOULDER REPLACEMENT       OB History   None      Home Medications    Prior to Admission medications   Medication Sig Start Date End Date Taking? Authorizing Provider  atorvastatin (LIPITOR) 20 MG tablet Take 0.5 tablets (10 mg total) by mouth daily at 6 PM. 12/29/17  Yes McLean-Scocuzza, Nino Glow, MD  Cholecalciferol (VITAMIN D3 PO)  Take 1 capsule by mouth daily.   Yes [provider]  fluticasone (FLONASE) 50 MCG/ACT nasal spray Place 1 spray into both nostrils as needed for allergies or rhinitis.   Yes [provider]  levothyroxine (SYNTHROID, LEVOTHROID) 88 MCG tablet Take 1 tablet (88 mcg total) by mouth daily before breakfast. 12/29/17  Yes McLean-Scocuzza, Nino Glow, MD  lisinopril (PRINIVIL,ZESTRIL) 20 MG tablet Take 1 tablet (20 mg total) by mouth daily. 12/29/17  Yes McLean-Scocuzza, Nino Glow, MD  Multiple Vitamin (MULTI-VITAMINS) TABS Take 1 tablet by mouth daily.    Yes [provider]  oxyCODONE (OXY IR/ROXICODONE) 5 MG immediate release tablet Take 5 mg by mouth 2 (two) times daily as needed for severe pain.   Yes [provider]  zolpidem (AMBIEN) 5 MG tablet Take 5 mg by mouth at bedtime as needed for sleep.    Yes [provider]  nitrofurantoin, macrocrystal-monohydrate, (MACROBID) 100 MG capsule Take 1 capsule (100 mg total) daily by mouth. Patient not taking: Reported on 05/25/2018 09/29/17   Bjorn Loser, MD    Family History Family History  Problem Relation Age of Onset  . Arthritis Mother   . Heart disease Mother   . Hyperlipidemia Mother   . Hypertension Mother   . Arthritis Father   . Diabetes Father   . Cancer Father        colon  . Arthritis Sister   . Hyperlipidemia Sister   . Hypertension Sister   . Cancer Brother        lung, smoker  . Mental retardation Brother   . Drug abuse Daughter   . Arthritis Sister   . Hyperlipidemia Sister   . Bladder Cancer Neg Hx   . Kidney cancer Neg Hx     Social History Social History   Tobacco Use  . Smoking status: Never Smoker  . Smokeless tobacco: Never Used  Substance Use Topics  . Alcohol use: Yes    Alcohol/week: 0.6 oz    Types: 1 Glasses of wine per week    Comment: nightly  . Drug use: No     Allergies   Sulfa antibiotics   Review of Systems Review of Systems  Constitutional:  Negative for chills and fever.  Respiratory: Negative for shortness of breath.   Cardiovascular: Negative for chest pain.  Gastrointestinal: Positive for abdominal pain, diarrhea, nausea and vomiting. Negative for blood in stool and constipation.  Genitourinary: Negative for dysuria, frequency, hematuria and urgency.  All other systems reviewed and are negative.    Physical Exam Updated Vital Signs BP (!) 142/73 (BP Location: Left Arm)   Pulse 72   Temp 98.1 F (36.7 C) (Oral)   Resp 16   Ht 4\' 11"  (1.499 m)   Wt 59 kg (130  lb)   SpO2 100%   BMI 26.26 kg/m   Physical Exam  Constitutional: She appears well-developed and well-nourished. No distress.  HENT:  Head: Normocephalic and atraumatic.  Eyes: Conjunctivae are normal. Right eye exhibits no discharge. Left eye exhibits no discharge.  Neck: No JVD present. No tracheal deviation present.  Cardiovascular: Normal rate, regular rhythm and normal heart sounds.  Pulmonary/Chest: Effort normal and breath sounds normal.  Abdominal: Soft. Bowel sounds are normal. There is tenderness in the suprapubic area, left upper quadrant and left lower quadrant. There is guarding. There is no rigidity, no rebound, no CVA tenderness, no tenderness at McBurney's point and negative Murphy's sign.  Well-healed surgical incisions to the abdomen.  Slightly decreased bowel sounds on the left, there is a palpable left-sided hernia, unable to reduce secondary to patient's pain.  Musculoskeletal: She exhibits no edema.  Well-healed midline surgical incision.  No midline spine tenderness, bilateral paralumbar muscle tenderness noted.  No deformity, crepitus, or step-off noted.  Neurological: She is alert.  Skin: No erythema.  Psychiatric: She has a normal mood and affect. Her behavior is normal.  Nursing note and vitals reviewed.    ED Treatments / Results  Labs (all labs ordered are listed, but only abnormal results are displayed) Labs Reviewed    COMPREHENSIVE METABOLIC PANEL - Abnormal; Notable for the following components:      Result Value   Glucose, Bld 143 (*)    All other components within normal limits  CBC - Abnormal; Notable for the following components:   WBC 13.4 (*)    RBC 5.21 (*)    All other components within normal limits  LIPASE, BLOOD  URINALYSIS, ROUTINE W REFLEX MICROSCOPIC    EKG None  Radiology Ct Abdomen Pelvis W Contrast  Result Date: 05/28/2018 CLINICAL DATA:  72 year old female with 2 weeks of progressive left lower quadrant abdominal pain. Left lower quadrant small bowel containing hernia. Nausea vomiting. Remote history of breast cancer (2009). EXAM: CT ABDOMEN AND PELVIS WITH CONTRAST TECHNIQUE: Multidetector CT imaging of the abdomen and pelvis was performed using the standard protocol following bolus administration of intravenous contrast. CONTRAST:  192mL ISOVUE-300 IOPAMIDOL (ISOVUE-300) INJECTION 61% COMPARISON:  [facility] CT Abdomen and Pelvis 04/03/2018. FINDINGS: Lower chest: No pericardial or pleural effusion. Mild lung base atelectasis or scarring is stable. Hepatobiliary: Negative liver and gallbladder. Pancreas: Negative. Spleen: Negative. Adrenals/Urinary Tract: Adrenal glands and kidneys appear stable and negative with symmetric renal contrast enhancement and excretion. Diminutive and unremarkable urinary bladder. Stomach/Bowel: Decompressed rectosigmoid colon. Severe diverticulosis of the sigmoid, and moderate diverticulosis of the descending colon. There is trace free fluid in the left pericolic gutter (series 2, image 35). The distal transverse colon is also decompressed with mild diverticulosis. The proximal transverse and right colon are normal. The appendix is normal on series 2, image 49. The distal small bowel is mostly decompressed. No oral contrast was administered. The stomach, duodenum, and proximal jejunum are decompressed. Along the left lateral flank there is an abdominal  wall spigelian hernia containing small bowel loops which appear incarcerated and inflamed (series 2, image 50, coronal image 21 and 34). Loops upstream of this hernia are mildly dilated and fluid-filled. The immediate downstream loops are decompressed. The hernia appears incarcerated despite a relatively broad neck. Mesentery extends into the hernia as seen on coronal image 29. A 2nd hernia at the left inguinal level no longer contains small bowel, but does contain a small volume of fluid or stranding on series  2, image 65. There is a 3rd small fat containing infraumbilical hernia which is stable. No abdominal free air. No other abdominal free fluid. Vascular/Lymphatic: Aortoiliac calcified atherosclerosis. Major arterial structures are patent. Portal venous system is patent. No lymphadenopathy. Reproductive: Negative. Other: Trace pelvic free fluid (series 2, image 68). Musculoskeletal: Extensive thoracic, lumbar and sacral/pelvic posterior spinal fusion hardware with streak artifact. Previous lumbar posterior decompression. Stable visualized osseous structures. IMPRESSION: 1. Acute Small Bowel Obstruction due to a left lateral flank hernia containing small bowel which appear incarcerated/inflamed. 2. Trace free fluid in the left gutter and pelvis.  No free air. 3. No other acute or inflammatory process. Small left inguinal hernia which no longer contains small bowel. Small fat containing infraumbilical hernia. Extensive diverticulosis of the distal colon. Electronically Signed   By: Genevie Ann M.D.   On: 05/28/2018 15:22    Procedures Hernia reduction Date/Time: 05/28/2018 5:03 PM Performed by: Renita Papa, PA-C Authorized by: Renita Papa, PA-C  Consent: Verbal consent obtained. Consent given by: patient Patient understanding: patient states understanding of the procedure being performed Patient consent: the patient's understanding of the procedure matches consent given Relevant documents: relevant  documents present and verified Imaging studies: imaging studies available Required items: required blood products, implants, devices, and special equipment available Patient identity confirmed: verbally with patient Local anesthesia used: no  Anesthesia: Local anesthesia used: no  Sedation: Patient sedated: no  Patient tolerance: Patient tolerated the procedure well with no immediate complications Comments: Ice pack was applied to the abdomen 30 minutes prior to attempt to reduce the hernia.  Patient was put in Trendelenburg position and 4 mg of morphine were given just prior to hernia reduction attempt.  Attempt was performed by myself and Jeannett Senior, PA-C.  There was palpable movement of the incarcerated bowel with possible successful reduction of the hernia.    (including critical care time)  Medications Ordered in ED Medications  iopamidol (ISOVUE-300) 61 % injection (has no administration in time range)  morphine 4 MG/ML injection 4 mg (4 mg Intravenous Given 05/28/18 1430)  ondansetron (ZOFRAN) injection 4 mg (4 mg Intravenous Given 05/28/18 1429)  sodium chloride 0.9 % bolus 1,000 mL (0 mLs Intravenous Stopped 05/28/18 1531)  iopamidol (ISOVUE-300) 61 % injection 100 mL (100 mLs Intravenous Contrast Given 05/28/18 1455)  morphine 4 MG/ML injection 4 mg (4 mg Intravenous Given 05/28/18 1647)     Initial Impression / Assessment and Plan / ED Course  I have reviewed the triage vital signs and the nursing notes.  Pertinent labs & imaging results that were available during my care of the patient were reviewed by me and considered in my medical decision making (see chart for details).     Patient with known left lateral incisional hernia and left inguinal hernia presents for evaluation of acute worsening of abdominal pain and sudden onset persistent nausea and vomiting.  She is afebrile, vital signs are stable.  She is nontoxic in appearance.  No peritoneal signs on examination of  the abdomen although there is a palpable hernia.  Lab work reviewed by me shows mild leukocytosis.  No significant electrolyte abnormalities.  LFTs, creatinine, and lipase are within normal limits.  CT of the abdomen and pelvis shows acute small bowel obstruction due to left lateral flank hernia containing small bowel which appears incarcerated.  No free air.  Her small left inguinal hernia no longer contains small bowel.  Patient notes significant improvement in her pain with morphine. 4:11  PM Spoke with RN for Dr. Johney Maine, he recommends attempting to reduce the hernia and will assess patient shortly.  5:05 PM Hernia reduction was attempted in conjunction with PA Kirichenko. Patient tolerated procedure well and the hernia may have been reduced. Signed out to Carter.  Awaiting UA results and further evaluation recommendations from Dr. Johney Maine.  Final Clinical Impressions(s) / ED Diagnoses   Final diagnoses:  Incarcerated incisional hernia    ED Discharge Orders    None       Renita Papa, PA-C 05/28/18 1707    Fredia Sorrow, MD 05/29/18 [phone]

## 2018-05-28 NOTE — ED Triage Notes (Signed)
Patient reports that she is scheduled for hernia surgery tomorrow. Patient c/o vomiting and increased abdominal pain today.

## 2018-05-28 NOTE — ED Notes (Signed)
ED TO INPATIENT HANDOFF REPORT  Name/Age/Gender Maria Spencer 72 y.o. female  Code Status    Code Status Orders  (From admission, onward)        Start     Ordered   05/28/18 1806  Full code  Continuous     05/28/18 1807    Code Status History    This patient has a current code status but no historical code status.      Home/SNF/Other Home  Chief Complaint Emesis  Level of Care/Admitting Diagnosis ED Disposition    ED Disposition Condition Comment   Admit  Hospital Area: [facility] [[id number]]  Level of Care: Med-Surg [16]  Diagnosis: Incarcerated incisional hernia [[id number]]  Admitting Physician: [place], [place]  Attending Physician: Johnathan Hausen [[id number]]  Estimated length of stay: 3 - 4 days  Certification:: I certify this patient will need inpatient services for at least 2 midnights  Bed request comments: Surgery floor preferred  PT Class (Do Not Modify): Inpatient [101]  PT Acc Code (Do Not Modify): Private [1]       Medical History Past Medical History:  Diagnosis Date  . Arthritis    neck, back, left knee;   . Breast cancer [facility]) 2009   right lumpectomy s/p radiation   . Chicken pox   . Chronic UTI    established with urology   . Diverticular disease    -osis and -itis   . GERD (gastroesophageal reflux disease)   . Hormone disorder   . Hyperlipidemia   . Hypertension    controlled well with medication;   . Personal history of radiation therapy 2009   F/U right breast cancer  . Thyroid disease    hypothyroidism     Allergies Allergies  Allergen Reactions  . Sulfa Antibiotics Anaphylaxis, Hives and Itching    IV Location/Drains/Wounds Patient Lines/Drains/Airways Status   Active Line/Drains/Airways    Name:   Placement date:   Placement time:   Site:   Days:   Peripheral IV 05/28/18 Left Hand   05/28/18    1350    Hand   less than 1          Labs/Imaging Results for orders placed or performed during the  hospital encounter of 05/28/18 (from the past 48 hour(s))  Lipase, blood     Status: None   Collection Time: 05/28/18  1:53 PM  Result Value Ref Range   Lipase 23 11 - 51 U/L    Comment: Performed at [facility], [place] [address], [place], [place] [address]  Comprehensive metabolic panel     Status: Abnormal   Collection Time: 05/28/18  1:53 PM  Result Value Ref Range   Sodium 144 135 - 145 mmol/L   Potassium 4.1 3.5 - 5.1 mmol/L   Chloride 101 98 - 111 mmol/L    Comment: Please note change in reference range.   CO2 29 22 - 32 mmol/L   Glucose, Bld 143 (H) 70 - 99 mg/dL    Comment: Please note change in reference range.   BUN 19 8 - 23 mg/dL    Comment: Please note change in reference range.   Creatinine, Ser 0.66 0.44 - 1.00 mg/dL   Calcium 9.9 8.9 - 10.3 mg/dL   Total Protein 7.4 6.5 - 8.1 g/dL   Albumin 4.3 3.5 - 5.0 g/dL   AST 30 15 - 41 U/L   ALT 22 0 - 44 U/L    Comment: Please note  change in reference range.   Alkaline Phosphatase 94 38 - 126 U/L   Total Bilirubin 0.8 0.3 - 1.2 mg/dL   GFR calc non Af Amer >60 >60 mL/min   GFR calc Af Amer >60 >60 mL/min    Comment: (NOTE) The eGFR has been calculated using the CKD EPI equation. This calculation has not been validated in all clinical situations. eGFR's persistently <60 mL/min signify possible Chronic Kidney Disease.    Anion gap 14 5 - 15    Comment: Performed at [facility], [place] [address], [place], [place] [address]  CBC     Status: Abnormal   Collection Time: 05/28/18  1:53 PM  Result Value Ref Range   WBC 13.4 (H) 4.0 - 10.5 K/uL   RBC 5.21 (H) 3.87 - 5.11 MIL/uL   Hemoglobin 14.8 12.0 - 15.0 g/dL   HCT 45.0 36.0 - 46.0 %   MCV 86.4 78.0 - 100.0 fL   MCH 28.4 26.0 - 34.0 pg   MCHC 32.9 30.0 - 36.0 g/dL   RDW 14.6 11.5 - 15.5 %   Platelets 257 150 - 400 K/uL    Comment: Performed at [facility], [place] [address] [place], [place] [address]  Urinalysis,  Routine w reflex microscopic     Status: Abnormal   Collection Time: 05/28/18  4:45 PM  Result Value Ref Range   Color, Urine YELLOW YELLOW   APPearance CLEAR CLEAR   Specific Gravity, Urine >1.046 (H) 1.005 - 1.030   pH 6.0 5.0 - 8.0   Glucose, UA NEGATIVE NEGATIVE mg/dL   Hgb urine dipstick NEGATIVE NEGATIVE   Bilirubin Urine NEGATIVE NEGATIVE   Ketones, ur 20 (A) NEGATIVE mg/dL   Protein, ur NEGATIVE NEGATIVE mg/dL   Nitrite POSITIVE (A) NEGATIVE   Leukocytes, UA MODERATE (A) NEGATIVE   RBC / HPF 0-5 0 - 5 RBC/hpf   WBC, UA 21-50 0 - 5 WBC/hpf   Bacteria, UA NONE SEEN NONE SEEN   Squamous Epithelial / LPF 0-5 0 - 5   Mucus PRESENT     Comment: Performed at [facility], [place] [address] [place], [place] [address]   Ct Abdomen Pelvis W Contrast  Result Date: 05/28/2018 CLINICAL DATA:  72 year old female with 2 weeks of progressive left lower quadrant abdominal pain. Left lower quadrant small bowel containing hernia. Nausea vomiting. Remote history of breast cancer (2009). EXAM: CT ABDOMEN AND PELVIS WITH CONTRAST TECHNIQUE: Multidetector CT imaging of the abdomen and pelvis was performed using the standard protocol following bolus administration of intravenous contrast. CONTRAST:  115m ISOVUE-300 IOPAMIDOL (ISOVUE-300) INJECTION 61% COMPARISON:  [facility] CT Abdomen and Pelvis 04/03/2018. FINDINGS: Lower chest: No pericardial or pleural effusion. Mild lung base atelectasis or scarring is stable. Hepatobiliary: Negative liver and gallbladder. Pancreas: Negative. Spleen: Negative. Adrenals/Urinary Tract: Adrenal glands and kidneys appear stable and negative with symmetric renal contrast enhancement and excretion. Diminutive and unremarkable urinary bladder. Stomach/Bowel: Decompressed rectosigmoid colon. Severe diverticulosis of the sigmoid, and moderate diverticulosis of the descending colon. There is trace free fluid in the left pericolic gutter (series 2, image  35). The distal transverse colon is also decompressed with mild diverticulosis. The proximal transverse and right colon are normal. The appendix is normal on series 2, image 49. The distal small bowel is mostly decompressed. No oral contrast was administered. The stomach, duodenum, and proximal jejunum are decompressed. Along the left lateral flank there is an abdominal wall spigelian hernia containing small bowel  loops which appear incarcerated and inflamed (series 2, image 50, coronal image 21 and 34). Loops upstream of this hernia are mildly dilated and fluid-filled. The immediate downstream loops are decompressed. The hernia appears incarcerated despite a relatively broad neck. Mesentery extends into the hernia as seen on coronal image 29. A 2nd hernia at the left inguinal level no longer contains small bowel, but does contain a small volume of fluid or stranding on series 2, image 65. There is a 3rd small fat containing infraumbilical hernia which is stable. No abdominal free air. No other abdominal free fluid. Vascular/Lymphatic: Aortoiliac calcified atherosclerosis. Major arterial structures are patent. Portal venous system is patent. No lymphadenopathy. Reproductive: Negative. Other: Trace pelvic free fluid (series 2, image 68). Musculoskeletal: Extensive thoracic, lumbar and sacral/pelvic posterior spinal fusion hardware with streak artifact. Previous lumbar posterior decompression. Stable visualized osseous structures. IMPRESSION: 1. Acute Small Bowel Obstruction due to a left lateral flank hernia containing small bowel which appear incarcerated/inflamed. 2. Trace free fluid in the left gutter and pelvis.  No free air. 3. No other acute or inflammatory process. Small left inguinal hernia which no longer contains small bowel. Small fat containing infraumbilical hernia. Extensive diverticulosis of the distal colon. Electronically Signed   By: Genevie Ann M.D.   On: 05/28/2018 15:22    Pending Labs Unresulted  Labs (From admission, onward)   Start     Ordered   06/04/18 0500  Creatinine, serum  (enoxaparin (LOVENOX)    CrCl >/= 30 ml/min)  Weekly,   R    Comments:  while on enoxaparin therapy    05/28/18 1807      Vitals/Pain Today's Vitals   05/28/18 1315 05/28/18 1316 05/28/18 1713  BP: (!) 142/73    Pulse: 72    Resp: 16    Temp: 98.1 F (36.7 C)    TempSrc: Oral    SpO2: 100%    Weight:  130 lb (59 kg)   Height:  '4\' 11"'$  (1.499 m)   PainSc:  10-Worst pain ever 5     Isolation Precautions No active isolations  Medications Medications  iopamidol (ISOVUE-300) 61 % injection (has no administration in time range)  enoxaparin (LOVENOX) injection 40 mg (has no administration in time range)  lactated ringers infusion (has no administration in time range)  acetaminophen (TYLENOL) tablet 650 mg (has no administration in time range)    Or  acetaminophen (TYLENOL) suppository 650 mg (has no administration in time range)  diphenhydrAMINE (BENADRYL) 12.5 MG/5ML elixir 12.5 mg (has no administration in time range)    Or  diphenhydrAMINE (BENADRYL) injection 12.5 mg (has no administration in time range)  ondansetron (ZOFRAN-ODT) disintegrating tablet 4 mg (has no administration in time range)    Or  ondansetron (ZOFRAN) injection 4 mg (has no administration in time range)  simethicone (MYLICON) chewable tablet 40 mg (has no administration in time range)  Chlorhexidine Gluconate Cloth 2 % PADS 6 each (has no administration in time range)    And  Chlorhexidine Gluconate Cloth 2 % PADS 6 each (has no administration in time range)  acetaminophen (TYLENOL) tablet 1,000 mg (has no administration in time range)  ceFAZolin (ANCEF) IVPB 2g/100 mL premix (has no administration in time range)  gabapentin (NEURONTIN) capsule 300 mg (has no administration in time range)  heparin injection 5,000 Units (has no administration in time range)  atorvastatin (LIPITOR) tablet 10 mg (has no administration in  time range)  fluticasone (FLONASE) 50 MCG/ACT nasal spray 1  spray (has no administration in time range)  levothyroxine (SYNTHROID, LEVOTHROID) tablet 88 mcg (has no administration in time range)  lisinopril (PRINIVIL,ZESTRIL) tablet 20 mg (has no administration in time range)  MULTI-VITAMINS TABS 1 tablet (has no administration in time range)  zolpidem (AMBIEN) tablet 5 mg (has no administration in time range)  oxyCODONE (Oxy IR/ROXICODONE) immediate release tablet 5 mg (has no administration in time range)  lactated ringers bolus 1,000 mL (has no administration in time range)  lactated ringers bolus 1,000 mL (has no administration in time range)  methocarbamol (ROBAXIN) 1,000 mg in dextrose 5 % 50 mL IVPB (has no administration in time range)  prochlorperazine (COMPAZINE) injection 5-10 mg (has no administration in time range)  metoCLOPramide (REGLAN) injection 10 mg (has no administration in time range)  lip balm (CARMEX) ointment 1 application (has no administration in time range)  magic mouthwash (has no administration in time range)  bisacodyl (DULCOLAX) suppository 10 mg (has no administration in time range)  guaiFENesin-dextromethorphan (ROBITUSSIN DM) 100-10 MG/5ML syrup 10 mL (has no administration in time range)  hydrocortisone (ANUSOL-HC) 2.5 % rectal cream 1 application (has no administration in time range)  alum & mag hydroxide-simeth (MAALOX/MYLANTA) 200-200-20 MG/5ML suspension 30 mL (has no administration in time range)  hydrocortisone cream 1 % 1 application (has no administration in time range)  menthol-cetylpyridinium (CEPACOL) lozenge 3 mg (has no administration in time range)  phenol (CHLORASEPTIC) mouth spray 1-2 spray (has no administration in time range)  enalaprilat (VASOTEC) injection 0.625-1.25 mg (has no administration in time range)  metoprolol tartrate (LOPRESSOR) injection 5 mg (has no administration in time range)  morphine 4 MG/ML injection 4 mg (4 mg  Intravenous Given 05/28/18 1430)  ondansetron (ZOFRAN) injection 4 mg (4 mg Intravenous Given 05/28/18 1429)  sodium chloride 0.9 % bolus 1,000 mL (0 mLs Intravenous Stopped 05/28/18 1531)  iopamidol (ISOVUE-300) 61 % injection 100 mL (100 mLs Intravenous Contrast Given 05/28/18 1455)  morphine 4 MG/ML injection 4 mg (4 mg Intravenous Given 05/28/18 1647)    Mobility walks

## 2018-05-28 NOTE — ED Provider Notes (Signed)
Medical screening examination/treatment/procedure(s) were conducted as a shared visit with non-physician practitioner(s) and myself.  I personally evaluated the patient during the encounter.  None   Patient seen by me along with physician assistant.  Patient has a known history of multiple hernias on the left side of her abdomen.  Plans were for admission tomorrow for elective repair of these.  Patient's had increased abdominal pain and vomiting multiple episodes of vomiting today all highly suggestive of perhaps an incarcerated hernia.  Will get CT of abdomen.  Will discuss either way with general surgery for consideration for admission tonight since they were planning to admit her tomorrow anyways.  CT scan will be used to determine whether there is incarcerated hernia or not.  Patient's abdomen a little bit distended.  Several palpable hernias not reducible no erythema to the skin.  Generalized tenderness to palpation over the hernias.   Fredia Sorrow, MD 05/28/18 [phone]
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
## 2018-05-29 ENCOUNTER — Inpatient Hospital Stay (HOSPITAL_COMMUNITY): Admission: RE | Admit: 2018-05-29 | Payer: Medicare Other | Source: Ambulatory Visit | Admitting: Surgery

## 2018-05-29 ENCOUNTER — Inpatient Hospital Stay (HOSPITAL_COMMUNITY): Payer: Medicare Other | Admitting: Anesthesiology

## 2018-05-29 ENCOUNTER — Encounter (HOSPITAL_COMMUNITY)
Admission: EM | Disposition: A | Discharge status: Home / Self Care | Payer: Self-pay | Source: Home / Self Care | Attending: Surgery

## 2018-05-29 ENCOUNTER — Encounter (HOSPITAL_COMMUNITY): Payer: Self-pay | Admitting: Emergency Medicine

## 2018-05-29 HISTORY — PX: INSERTION OF MESH: SHX5868

## 2018-05-29 HISTORY — PX: VENTRAL HERNIA REPAIR: SHX424

## 2018-05-29 LAB — CBC
HCT: 40.1 % (ref 36.0–46.0)
Hemoglobin: 12.8 g/dL (ref 12.0–15.0)
MCH: 27.6 pg (ref 26.0–34.0)
MCHC: 31.9 g/dL (ref 30.0–36.0)
MCV: 86.6 fL (ref 78.0–100.0)
Platelets: 264 10*3/uL (ref 150–400)
RBC: 4.63 MIL/uL (ref 3.87–5.11)
RDW: 14.9 % (ref 11.5–15.5)
WBC: 10.9 10*3/uL — ABNORMAL HIGH (ref 4.0–10.5)

## 2018-05-29 LAB — CREATININE, SERUM
Creatinine, Ser: 0.59 mg/dL (ref 0.44–1.00)
GFR calc Af Amer: >60 mL/min (ref >60)
GFR calc non Af Amer: >60 mL/min (ref >60)

## 2018-05-29 SURGERY — REPAIR, HERNIA, VENTRAL, LAPAROSCOPIC
Anesthesia: General | Site: Abdomen

## 2018-05-29 MED ORDER — PROPOFOL 10 MG/ML IV BOLUS
INTRAVENOUS | Status: DC | PRN
  Administered 2018-05-29: 150 mg via INTRAVENOUS

## 2018-05-29 MED ORDER — HEPARIN SODIUM (PORCINE) 5000 UNIT/ML IJ SOLN
5000.0000 [IU] | Freq: Three times a day (TID) | INTRAMUSCULAR | Status: DC
  Administered 2018-05-29 – 2018-05-30 (×2): 5000 [IU] via SUBCUTANEOUS
  Filled 2018-05-29 (×2): qty 1

## 2018-05-29 MED ORDER — ONDANSETRON HCL 4 MG/2ML IJ SOLN
INTRAMUSCULAR | Status: DC | PRN
  Administered 2018-05-29: 4 mg via INTRAVENOUS

## 2018-05-29 MED ORDER — DEXAMETHASONE SODIUM PHOSPHATE 10 MG/ML IJ SOLN
INTRAMUSCULAR | Status: DC | PRN
  Administered 2018-05-29: 10 mg via INTRAVENOUS

## 2018-05-29 MED ORDER — PROMETHAZINE HCL 25 MG/ML IJ SOLN: 6.2500 mg | INTRAMUSCULAR | Status: DC | PRN

## 2018-05-29 MED ORDER — 0.9 % SODIUM CHLORIDE (POUR BTL) OPTIME
TOPICAL | Status: DC | PRN
  Administered 2018-05-29 (×2): 1000 mL

## 2018-05-29 MED ORDER — ACETAMINOPHEN 500 MG PO TABS: 1000.0000 mg | ORAL_TABLET | ORAL | Status: DC

## 2018-05-29 MED ORDER — LIDOCAINE 2% (20 MG/ML) 5 ML SYRINGE
INTRAMUSCULAR | Status: DC | PRN
  Administered 2018-05-29: 1.5 mg/kg/h via INTRAVENOUS

## 2018-05-29 MED ORDER — ROCURONIUM BROMIDE 100 MG/10ML IV SOLN
INTRAVENOUS | Status: DC | PRN
  Administered 2018-05-29 (×2): 10 mg via INTRAVENOUS
  Administered 2018-05-29: 30 mg via INTRAVENOUS
  Administered 2018-05-29: 10 mg via INTRAVENOUS

## 2018-05-29 MED ORDER — BUPIVACAINE LIPOSOME 1.3 % IJ SUSP
INTRAMUSCULAR | Status: DC | PRN
  Administered 2018-05-29: 20 mL

## 2018-05-29 MED ORDER — KCL IN DEXTROSE-NACL 20-5-0.45 MEQ/L-%-% IV SOLN
INTRAVENOUS | Status: DC
  Administered 2018-05-29: 15:00:00 via INTRAVENOUS
  Filled 2018-05-29 (×3): qty 1000

## 2018-05-29 MED ORDER — SCOPOLAMINE 1 MG/3DAYS TD PT72
MEDICATED_PATCH | TRANSDERMAL | Status: AC
  Filled 2018-05-29: qty 1

## 2018-05-29 MED ORDER — CHLORHEXIDINE GLUCONATE CLOTH 2 % EX PADS
6.0000 | MEDICATED_PAD | Freq: Once | CUTANEOUS | Status: AC
  Administered 2018-05-29: 6 via TOPICAL

## 2018-05-29 MED ORDER — HYDRALAZINE HCL 20 MG/ML IJ SOLN: 10.0000 mg | INTRAMUSCULAR | Status: DC | PRN

## 2018-05-29 MED ORDER — SUGAMMADEX SODIUM 200 MG/2ML IV SOLN
INTRAVENOUS | Status: DC | PRN
  Administered 2018-05-29: 200 mg via INTRAVENOUS

## 2018-05-29 MED ORDER — GABAPENTIN 300 MG PO CAPS: 300.0000 mg | ORAL_CAPSULE | ORAL | Status: DC

## 2018-05-29 MED ORDER — KETOROLAC TROMETHAMINE 30 MG/ML IJ SOLN
30.0000 mg | Freq: Once | INTRAMUSCULAR | Status: AC | PRN
  Administered 2018-05-29: 30 mg via INTRAVENOUS

## 2018-05-29 MED ORDER — FENTANYL CITRATE (PF) 100 MCG/2ML IJ SOLN
INTRAMUSCULAR | Status: DC | PRN
  Administered 2018-05-29 (×6): 50 ug via INTRAVENOUS

## 2018-05-29 MED ORDER — KETOROLAC TROMETHAMINE 30 MG/ML IJ SOLN
INTRAMUSCULAR | Status: AC
  Filled 2018-05-29: qty 1

## 2018-05-29 MED ORDER — FAMOTIDINE IN NACL 20-0.9 MG/50ML-% IV SOLN
20.0000 mg | Freq: Two times a day (BID) | INTRAVENOUS | Status: DC
  Administered 2018-05-29 – 2018-05-30 (×3): 20 mg via INTRAVENOUS
  Filled 2018-05-29 (×3): qty 50

## 2018-05-29 MED ORDER — OXYCODONE-ACETAMINOPHEN 5-325 MG PO TABS
1.0000 | ORAL_TABLET | ORAL | Status: DC | PRN
  Administered 2018-05-29 – 2018-05-30 (×2): 2 via ORAL
  Filled 2018-05-29 (×2): qty 2

## 2018-05-29 MED ORDER — SCOPOLAMINE 1 MG/3DAYS TD PT72
1.0000 | MEDICATED_PATCH | TRANSDERMAL | Status: DC
  Administered 2018-05-29: 1 via TRANSDERMAL

## 2018-05-29 MED ORDER — HYDROMORPHONE HCL 1 MG/ML IJ SOLN
INTRAMUSCULAR | Status: AC
  Filled 2018-05-29: qty 2

## 2018-05-29 MED ORDER — LIDOCAINE 2% (20 MG/ML) 5 ML SYRINGE
INTRAMUSCULAR | Status: DC | PRN
  Administered 2018-05-29: 100 mg via INTRAVENOUS

## 2018-05-29 MED ORDER — CEFOTETAN DISODIUM 2 G IJ SOLR
2.0000 g | INTRAMUSCULAR | Status: AC
  Administered 2018-05-29: 2 g via INTRAVENOUS
  Filled 2018-05-29: qty 2

## 2018-05-29 MED ORDER — CHLORHEXIDINE GLUCONATE CLOTH 2 % EX PADS: 6.0000 | MEDICATED_PAD | Freq: Once | CUTANEOUS | Status: DC

## 2018-05-29 MED ORDER — SUCCINYLCHOLINE CHLORIDE 200 MG/10ML IV SOSY
PREFILLED_SYRINGE | INTRAVENOUS | Status: DC | PRN
  Administered 2018-05-29: 100 mg via INTRAVENOUS

## 2018-05-29 MED ORDER — HEPARIN SODIUM (PORCINE) 5000 UNIT/ML IJ SOLN
5000.0000 [IU] | Freq: Once | INTRAMUSCULAR | Status: AC
  Administered 2018-05-29: 5000 [IU] via SUBCUTANEOUS
  Filled 2018-05-29: qty 1

## 2018-05-29 MED ORDER — HYDROMORPHONE HCL 1 MG/ML IJ SOLN
0.5000 mg | INTRAMUSCULAR | Status: DC | PRN
  Administered 2018-05-29: 0.5 mg via INTRAVENOUS
  Filled 2018-05-29: qty 0.5

## 2018-05-29 MED ORDER — SODIUM CHLORIDE 0.9 % IV SOLN
2.0000 g | Freq: Two times a day (BID) | INTRAVENOUS | Status: AC
  Administered 2018-05-29: 2 g via INTRAVENOUS
  Filled 2018-05-29: qty 2

## 2018-05-29 MED ORDER — BUPIVACAINE-EPINEPHRINE 0.25% -1:200000 IJ SOLN
INTRAMUSCULAR | Status: DC | PRN
  Administered 2018-05-29: 20 mL

## 2018-05-29 MED ORDER — HYDROMORPHONE HCL 1 MG/ML IJ SOLN
0.2500 mg | INTRAMUSCULAR | Status: DC | PRN
  Administered 2018-05-29 (×4): 0.5 mg via INTRAVENOUS

## 2018-05-29 MED ORDER — BUPIVACAINE LIPOSOME 1.3 % IJ SUSP
20.0000 mL | Freq: Once | INTRAMUSCULAR | Status: DC
  Filled 2018-05-29: qty 20

## 2018-05-29 MED ORDER — LACTATED RINGERS IV SOLN
INTRAVENOUS | Status: DC
  Administered 2018-05-29: 10:00:00 via INTRAVENOUS

## 2018-05-29 SURGICAL SUPPLY — 46 items
BINDER ABDOMINAL 12 ML 46-62 (SOFTGOODS) ×2 IMPLANT
CABLE HIGH FREQUENCY MONO STRZ (ELECTRODE) ×2 IMPLANT
COVER SURGICAL LIGHT HANDLE (MISCELLANEOUS) ×2 IMPLANT
DECANTER SPIKE VIAL GLASS SM (MISCELLANEOUS) IMPLANT
DERMABOND ADVANCED (GAUZE/BANDAGES/DRESSINGS) ×1
DERMABOND ADVANCED .7 DNX12 (GAUZE/BANDAGES/DRESSINGS) ×1 IMPLANT
DEVICE SECURE STRAP 25 ABSORB (INSTRUMENTS) IMPLANT
DEVICE TROCAR PUNCTURE CLOSURE (ENDOMECHANICALS) ×2 IMPLANT
DISSECTOR BLUNT TIP ENDO 5MM (MISCELLANEOUS) IMPLANT
DRAPE LAPAROSCOPIC ABDOMINAL (DRAPES) ×2 IMPLANT
DRSG OPSITE POSTOP 3X4 (GAUZE/BANDAGES/DRESSINGS) ×2 IMPLANT
DRSG OPSITE POSTOP 4X8 (GAUZE/BANDAGES/DRESSINGS) ×2 IMPLANT
DRSG TEGADERM 2-3/8X2-3/4 SM (GAUZE/BANDAGES/DRESSINGS) ×4 IMPLANT
DRSG TEGADERM 4X4.75 (GAUZE/BANDAGES/DRESSINGS) ×2 IMPLANT
ELECT PENCIL ROCKER SW 15FT (MISCELLANEOUS) ×2 IMPLANT
ELECT REM PT RETURN 15FT ADLT (MISCELLANEOUS) ×2 IMPLANT
GLOVE BIO SURGEON STRL SZ7.5 (GLOVE) ×2 IMPLANT
GLOVE BIOGEL M 8.0 STRL (GLOVE) ×2 IMPLANT
GLOVE BIOGEL PI IND STRL 6.5 (GLOVE) ×2 IMPLANT
GLOVE BIOGEL PI IND STRL 7.0 (GLOVE) ×2 IMPLANT
GLOVE BIOGEL PI INDICATOR 6.5 (GLOVE) ×2
GLOVE BIOGEL PI INDICATOR 7.0 (GLOVE) ×2
GOWN STRL REUS W/TWL XL LVL3 (GOWN DISPOSABLE) ×8 IMPLANT
KIT BASIN OR (CUSTOM PROCEDURE TRAY) ×2 IMPLANT
MARKER SKIN DUAL TIP RULER LAB (MISCELLANEOUS) ×2 IMPLANT
MESH HERNIA 6X6 BARD (Mesh General) ×1 IMPLANT
MESH HERNIA BARD 6X6 (Mesh General) ×1 IMPLANT
NEEDLE SPNL 22GX3.5 QUINCKE BK (NEEDLE) ×2 IMPLANT
SCRUB TECHNI CARE 4 OZ NO DYE (MISCELLANEOUS) ×2 IMPLANT
SET IRRIG TUBING LAPAROSCOPIC (IRRIGATION / IRRIGATOR) IMPLANT
SHEARS HARMONIC ACE PLUS 45CM (MISCELLANEOUS) IMPLANT
SLEEVE XCEL OPT CAN 5 100 (ENDOMECHANICALS) ×4 IMPLANT
STAPLER VISISTAT 35W (STAPLE) ×2 IMPLANT
SUT MNCRL AB 4-0 PS2 18 (SUTURE) ×4 IMPLANT
SUT NOVA NAB DX-16 0-1 5-0 T12 (SUTURE) ×14 IMPLANT
SUT VIC AB 3-0 SH 18 (SUTURE) ×2 IMPLANT
SUT VIC AB 4-0 SH 18 (SUTURE) ×2 IMPLANT
TACKER 5MM HERNIA 3.5CML NAB (ENDOMECHANICALS) IMPLANT
TOWEL OR 17X26 10 PK STRL BLUE (TOWEL DISPOSABLE) ×2 IMPLANT
TOWEL OR NON WOVEN STRL DISP B (DISPOSABLE) ×2 IMPLANT
TRAY FOLEY W/BAG SLVR 14FR (SET/KITS/TRAYS/PACK) ×2 IMPLANT
TRAY LAPAROSCOPIC (CUSTOM PROCEDURE TRAY) ×2 IMPLANT
TROCAR BLADELESS OPT 5 100 (ENDOMECHANICALS) ×2 IMPLANT
TROCAR XCEL NON-BLD 11X100MML (ENDOMECHANICALS) IMPLANT
TUBING INSUF HEATED (TUBING) ×2 IMPLANT
YANKAUER SUCT BULB TIP 10FT TU (MISCELLANEOUS) ×2 IMPLANT

## 2018-05-29 NOTE — Op Note (Signed)
Maria Spencer  [birth year] 29 May 2018   PCP:  Maria Spencer, Maria Glow, MD   Surgeon: Kaylyn Lim, MD, FACS  Asst:  Star Age, MD, FACS  Anes:  general  Preop Dx: Multiple flank herniae on the left after bone graft harvest Postop Dx: Indirect inguinal left, large left flank hernia  Procedure: Laparoscopic assisted left inguinal hernia repair and repair left flank hernia with marlex type mesh placed exterior to fascia as an onlay Location Surgery: [facility] OR 4 Complications: None noted  EBL:   10 cc  Drains: none  Description of Procedure:  The patient was taken to OR 4 .  After anesthesia was administered and the patient was prepped a timeout was performed.  Access to the abdomen was achieved to the right upper quadrant with a 5 mm Optiview without difficulty.  Patient was placed in head down and rotated slightly to the right and there were 3 defects visualized as reported on the CT scan.  There was a left indirect inguinal hernia which we had a narrow neck and was pretty deep and could have been the side of her bowel obstruction at the time of her presentation and then there was a large flank flank hernia with a hernia inside that hernia.  We elected to address inguinal hernia first making a transverse incision over that hernia cutting down to the subcutaneous tissue and then mobilizing and freeing the hernia sac completely down to the fascia.  At that point the sac was excised with the Bovie, the peritoneum was then closed with a running 3-0 Vicryl and then the internal ring and then the external oblique was closed with interrupted #1 Novafil's.  The ring was obliterated first internal ring was closed and then the fascia was closed over this to completely obliterate that hernia.  We put the scope back in and looked at it in the we did not impinge any bowel and look like we had a good closure.  Next we did address the large flank hernias.  We checked and it lined up pretty well with the  previous incision that been used to harvest her bone.  I excised the scar and then cut down to the subcutaneous tissue until we encountered the actual hernia sac.  We dissected that free from the perimeter of this to hernia defect as best we could and we could feel the edge.  We could feel some fascia really stuck closely to the bone inferiorly which was the most worrisome part for recurrence.  Once we had that freed up I then repaired the hernia within the hernia by just inverting that but the goal was to leave the sac intact and protect the bowel from any mesh that was a bridge for this defect.  We took a piece of 6 x 6 Marlex mesh and then began with horizontal mattress sutures of #1 Novafil placed around the perimeter and we threaded those up through the mesh and once we completely gone around the whole perimeter of this oblique defect then we began tying them down and when we had completed this we look back at it laparoscopically and and there was a good closure.  It did not significantly reduce the size of the flank hernia and there were no other defects noted.  The excess mesh was removed by trimming.  The fascia was injected with some Exparel which been diluted with some Marcaine to 40 cc and this was injected in this area.  The area was  irrigated.  I tacked the excess skin down laterally and I did not place a drain.  Once everything was closed both I closed to the both the inferior and the flank incisions with staples and after decompression with the 5 mm port in the right upper quadrant closed over the staple as well.  Abdominal binder was placed in the patient.  The patient tolerated the procedure well and was taken to the PACU in stable condition.     Matt B. Hassell Done, [place], [address] [facility], [place]

## 2018-05-29 NOTE — Anesthesia Procedure Notes (Signed)
Procedure Name: Intubation Date/Time: 05/29/2018 10:42 AM Performed by: Dione Booze, CRNA Pre-anesthesia Checklist: Suction available, Patient being monitored, Emergency Drugs available and Patient identified Patient Re-evaluated:Patient Re-evaluated prior to induction Oxygen Delivery Method: Circle system utilized Preoxygenation: Pre-oxygenation with 100% oxygen Induction Type: IV induction, Cricoid Pressure applied and Rapid sequence Laryngoscope Size: Mac and 4 Grade View: Grade II Tube type: Oral Tube size: 7.5 mm Number of attempts: 1 Airway Equipment and Method: Stylet Placement Confirmation: ETT inserted through vocal cords under direct vision,  positive ETCO2 and breath sounds checked- equal and bilateral Secured at: 21 cm Tube secured with: Tape Dental Injury: Teeth and Oropharynx as per pre-operative assessment

## 2018-05-29 NOTE — Transfer of Care (Signed)
Immediate Anesthesia Transfer of Care Note  Patient: Maria Spencer  Procedure(s) Performed: LAPAROSCOPIC VENTRAL / LATERAL HERNIA REPAIR (N/A Abdomen) INSERTION OF MESH (N/A Abdomen)  Patient Location: PACU  Anesthesia Type:General  Level of Consciousness: awake, alert  and patient cooperative  Airway & Oxygen Therapy: Patient Spontanous Breathing and Patient connected to face mask oxygen  Post-op Assessment: Report given to RN and Post -op Vital signs reviewed and stable  Post vital signs: Reviewed and stable  Last Vitals:  Vitals Value Taken Time  BP 158/75 05/29/2018  1:03 PM  Temp    Pulse 77 05/29/2018  1:05 PM  Resp 22 05/29/2018  1:05 PM  SpO2 100 % 05/29/2018  1:05 PM  Vitals shown include unvalidated device data.  Last Pain:  Vitals:   05/29/18 0950  TempSrc: Oral  PainSc: 3       Patients Stated Pain Goal: 4 (00/45/99 7741)  Complications: No apparent anesthesia complications

## 2018-05-29 NOTE — Progress Notes (Signed)
Initial Nutrition Assessment  DOCUMENTATION CODES:   (Will assess for malnutrition at follow-up. )  INTERVENTION:  - Diet advancement as medically feasible. - Will complete NFPE at follow-up. - Will assess for need for ONS at follow-up.   NUTRITION DIAGNOSIS:   Inadequate oral intake related to inability to eat as evidenced by NPO status.  GOAL:   Patient will meet greater than or equal to 90% of their needs  MONITOR:   Diet advancement, Weight trends, Labs, Skin  REASON FOR ASSESSMENT:   Malnutrition Screening Tool  ASSESSMENT:   72 y.o. female with history of breast cancer, UTI, diverticulosis, GERD, HLD, HTN, hypothyroidism, and arthritis. She presented to the ED for evaluation of aggressively worsening left-sided abdominal pain since 7/3.  Patient has known hernias to the left side of the abdomen; one is an incisional hernia on the left flank, the other is a small bowel containing left groin hernia thought to be inguinal rather than femoral. She states that she has had worsening pain of the left side of the abdomen over the past month but the pain acutely worsened 7/3 PM and she was unable to sleep due to the pain. The pain is aching and throbbing at baseline but will sometimes be sharp and stabbing specifically with position changes and movement and radiates to the low back. On 7/4 AM she awoke and had 10-15 episodes of nonbloody nonbilious emesis. Last vomited 1 hour PTA and endorses ongoing nausea. She denies any constipation; states that she has been dealing with loose stools for the past month.    BMI indicates overweight status. Pt was on CLD yesterday from 6:00 PM until midnight and then transitioned to NPO for surgery today. Patient has been out of her room, in pre-op/OR since this AM and is now in PACU. Unable to obtain any nutrition-related information at this time.   Per chart review, weight has been stable over the past 2 months. She has lost 3 lbs (2.2% body weight)  since February 2019. This is not significant for time frame.   Medications reviewed; 88 mcg oral Synthroid/day, daily multivitamin with minerals. Labs reviewed. IVF: LR @ 100 mL/hr.      NUTRITION - FOCUSED PHYSICAL EXAM:  Unable to complete; will attempt at follow-up.  Diet Order:   Diet Order           Diet NPO time specified Except for: Sips with Meds, Ice Chips  Diet effective 0500          EDUCATION NEEDS:   No education needs have been identified at this time  Skin:  Skin Assessment: Skin Integrity Issues: Skin Integrity Issues:: Incisions Incisions: abdomen (7/5)  Last BM:  7/3 (PTA)  Height:   Ht Readings from Last 1 Encounters:  05/29/18 4\' 11"  (1.499 m)    Weight:   Wt Readings from Last 1 Encounters:  05/29/18 130 lb (59 kg)    Ideal Body Weight:  42.91 kg  BMI:  Body mass index is 26.26 kg/m.  Estimated Nutritional Needs:   Kcal:  1770-1950 (30-33 kcal/kg)  Protein:  72-83 grams (1.2-1.4 grams/kg)  Fluid:  >/= 1.8 L/day     Jarome Matin, MS, RD, LDN, [facility] Inpatient Clinical Dietitian Pager # [phone] After hours/weekend pager # [phone]

## 2018-05-29 NOTE — Anesthesia Preprocedure Evaluation (Addendum)
Anesthesia Evaluation  Patient identified by MRN, date of birth, ID band Patient awake    Reviewed: Allergy & Precautions, NPO status , Patient's Chart, lab work & pertinent test results  Airway Mallampati: II  TM Distance: >3 FB Neck ROM: Full    Dental no notable dental hx.    Pulmonary neg pulmonary ROS,    Pulmonary exam normal breath sounds clear to auscultation       Cardiovascular hypertension, Normal cardiovascular exam Rhythm:Regular Rate:Normal     Neuro/Psych negative neurological ROS  negative psych ROS   GI/Hepatic Neg liver ROS, GERD  ,  Endo/Other  Hypothyroidism   Renal/GU negative Renal ROS  negative genitourinary   Musculoskeletal negative musculoskeletal ROS (+)   Abdominal   Peds negative pediatric ROS (+)  Hematology negative hematology ROS (+)   Anesthesia Other Findings   Reproductive/Obstetrics negative OB ROS                             Anesthesia Physical Anesthesia Plan  ASA: II  Anesthesia Plan: General   Post-op Pain Management:    Induction: Intravenous, Rapid sequence and Cricoid pressure planned  PONV Risk Score and Plan: 3 and Ondansetron, Dexamethasone, Treatment may vary due to age or medical condition and Scopolamine patch - Pre-op  Airway Management Planned: Oral ETT  Additional Equipment:   Intra-op Plan:   Post-operative Plan: Extubation in OR  Informed Consent: I have reviewed the patients History and Physical, chart, labs and discussed the procedure including the risks, benefits and alternatives for the proposed anesthesia with the patient or authorized representative who has indicated his/her understanding and acceptance.   Dental advisory given  Plan Discussed with: CRNA and Surgeon  Anesthesia Plan Comments:        Anesthesia Quick Evaluation

## 2018-05-30 LAB — COMPREHENSIVE METABOLIC PANEL
ALT: 16 U/L (ref 0–44)
AST: 21 U/L (ref 15–41)
Albumin: 2.9 g/dL — ABNORMAL LOW (ref 3.5–5.0)
Alkaline Phosphatase: 58 U/L (ref 38–126)
Anion gap: 8 (ref 5–15)
BUN: 12 mg/dL (ref 8–23)
CO2: 26 mmol/L (ref 22–32)
Calcium: 8.2 mg/dL — ABNORMAL LOW (ref 8.9–10.3)
Chloride: 101 mmol/L (ref 98–111)
Creatinine, Ser: 0.69 mg/dL (ref 0.44–1.00)
GFR calc Af Amer: >60 mL/min (ref >60)
GFR calc non Af Amer: >60 mL/min (ref >60)
Glucose, Bld: 154 mg/dL — ABNORMAL HIGH (ref 70–99)
Potassium: 4.1 mmol/L (ref 3.5–5.1)
Sodium: 135 mmol/L (ref 135–145)
Total Bilirubin: 0.3 mg/dL (ref 0.3–1.2)
Total Protein: 5.3 g/dL — ABNORMAL LOW (ref 6.5–8.1)

## 2018-05-30 LAB — CBC
HCT: 33 % — ABNORMAL LOW (ref 36.0–46.0)
Hemoglobin: 10.8 g/dL — ABNORMAL LOW (ref 12.0–15.0)
MCH: 28.1 pg (ref 26.0–34.0)
MCHC: 32.7 g/dL (ref 30.0–36.0)
MCV: 85.7 fL (ref 78.0–100.0)
Platelets: 226 10*3/uL (ref 150–400)
RBC: 3.85 MIL/uL — ABNORMAL LOW (ref 3.87–5.11)
RDW: 15.1 % (ref 11.5–15.5)
WBC: 9.3 10*3/uL (ref 4.0–10.5)

## 2018-05-30 MED ORDER — OXYCODONE-ACETAMINOPHEN 5-325 MG PO TABS: 1.0000 | ORAL_TABLET | ORAL | 0 refills | Status: DC | PRN

## 2018-05-30 NOTE — Progress Notes (Signed)
Discharge instructions discussed with patient and family, verbalized agreement and understanding 

## 2018-05-30 NOTE — Discharge Summary (Signed)
Physician Discharge Summary  Patient ID: Maria Spencer MRN: [id number] DOB/AGE: [birth year] 72 y.o.  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 Johney Maine on July 4th and had previously scheduled hernia repair on Friday the 5th.  This was accomplished and the patient was ready for discharge on Saturday.    Consults: none  Significant Diagnostic Studies: none    Discharge Exam: Blood pressure 110/70, pulse (!) 57, temperature (!) 97.3 F (36.3 C), temperature source Oral, resp. rate 16, height 4\' 11"  (1.499 m), weight 59 kg (130 lb), SpO2 95 %. Incisions with staples in place  Disposition: Discharge disposition: 01-Home or Self Care       Discharge Instructions    Call MD for:  persistant nausea and vomiting   Complete by:  As directed    Call MD for:  redness, tenderness, or signs of infection (pain, swelling, redness, odor or green/yellow discharge around incision site)   Complete by:  As directed    Diet - low sodium heart healthy   Complete by:  As directed    Increase activity slowly   Complete by:  As directed      Allergies as of 05/30/2018      Reactions   Sulfa Antibiotics Anaphylaxis, Hives, Itching      Medication List    TAKE these medications   atorvastatin 20 MG tablet Commonly known as:  LIPITOR Take 0.5 tablets (10 mg total) by mouth  daily at 6 PM.   fluticasone 50 MCG/ACT nasal spray Commonly known as:  FLONASE Place 1 spray into both nostrils as needed for allergies or rhinitis.   levothyroxine 88 MCG tablet Commonly known as:  SYNTHROID, LEVOTHROID Take 1 tablet (88 mcg total) by mouth daily before breakfast.   lisinopril 20 MG tablet Commonly known as:  PRINIVIL,ZESTRIL Take 1 tablet (20 mg total) by mouth daily.   MULTI-VITAMINS Tabs Take 1 tablet by mouth daily.   oxyCODONE 5 MG immediate release tablet Commonly known as:  Oxy IR/ROXICODONE Take 5 mg by mouth 2 (two) times daily as needed for severe pain.   oxyCODONE-acetaminophen 5-325 MG tablet Commonly known as:  PERCOCET/ROXICET Take 1-2 tablets by mouth every 4 (four) hours as needed for moderate pain.   VITAMIN D3 PO Take 1 capsule by mouth daily.   zolpidem 5 MG tablet Commonly known as:  AMBIEN Take 5 mg by mouth at bedtime as needed for sleep.        Signed: Pedro Earls 05/30/2018, 10:24 AM

## 2018-05-30 NOTE — Discharge Instructions (Signed)
 Ventral Hernia A ventral hernia is a bulge of tissue from inside the abdomen that pushes through a weak area of the muscles that form the front wall of the abdomen. The tissues inside the abdomen are inside a sac (peritoneum). These tissues include the small intestine, large intestine, and the fatty tissue that covers the intestines (omentum). Sometimes, the bulge that forms a hernia contains intestines. Other hernias contain only fat. Ventral hernias do not go away without surgical treatment. There are several types of ventral hernias. You may have:  A hernia at an incision site from previous abdominal surgery (incisional hernia).  A hernia just above the belly button (epigastric hernia), or at the belly button (umbilical hernia). These types of hernias can develop from heavy lifting or straining.  A hernia that comes and goes (reducible hernia). It may be visible only when you lift or strain. This type of hernia can be pushed back into the abdomen (reduced).  A hernia that traps abdominal tissue inside the hernia (incarcerated hernia). This type of hernia does not reduce.  A hernia that cuts off blood flow to the tissues inside the hernia (strangulated hernia). The tissues can start to die if this happens. This is a very painful bulge that cannot be reduced. A strangulated hernia is a medical emergency.  What are the causes? This condition is caused by abdominal tissue putting pressure on an area of weakness in the abdominal muscles. What increases the risk? The following factors may make you more likely to develop this condition:  Being female.  Being 60 or older.  Being overweight or obese.  Having had previous abdominal surgery, especially if there was an infection after surgery.  Having had an injury to the abdominal wall.  Having had several pregnancies.  Having a buildup of fluid inside the abdomen (ascites).  What are the signs or symptoms? The only symptom of a ventral  hernia may be a painless bulge in the abdomen. A reducible hernia may be visible only when you strain, cough, or lift. Other symptoms may include:  Dull pain.  A feeling of pressure.  Signs and symptoms of a strangulated hernia may include:  Increasing pain.  Nausea and vomiting.  Pain when pressing on the hernia.  The skin over the hernia turning red or purple.  Constipation.  Blood in the stool (feces).  How is this diagnosed? This condition may be diagnosed based on:  Your symptoms.  Your medical history.  A physical exam. You may be asked to cough or strain while standing. These actions increase the pressure inside your abdomen and force the hernia through the opening in your muscles. Your health care provider may try to reduce the hernia by pressing on it.  Imaging studies, such as an ultrasound or CT scan.  How is this treated? This condition is treated with surgery. If you have a strangulated hernia, surgery is done as soon as possible. If your hernia is small and not incarcerated, you may be asked to lose some weight before surgery. Follow these instructions at home:  Follow instructions from your health care provider about eating or drinking restrictions.  If you are overweight, your health care provider may recommend that you increase your activity level and eat a healthier diet.  Do not lift anything that is heavier than 10 lb (4.5 kg).  Return to your normal activities as told by your health care provider. Ask your health care provider what activities are safe for you. You   may need to avoid activities that increase pressure on your hernia.  Take over-the-counter and prescription medicines only as told by your health care provider.  Keep all follow-up visits as told by your health care provider. This is important. Contact a health care provider if:  Your hernia gets larger.  Your hernia becomes painful. Get help right away if:  Your hernia becomes  increasingly painful.  You have pain along with any of the following: ? Changes in skin color in the area of the hernia. ? Nausea. ? Vomiting. ? Fever. Summary  A ventral hernia is a bulge of tissue from inside the abdomen that pushes through a weak area of the muscles that form the front wall of the abdomen.  This condition is treated with surgery, which may be urgent depending on your hernia.  Do not lift anything that is heavier than 10 lb (4.5 kg), and follow activity instructions from your health care provider. This information is not intended to replace advice given to you by your health care provider. Make sure you discuss any questions you have with your health care provider. Document Released: 10/28/2012 Document Revised: 06/28/2016 Document Reviewed: 06/28/2016 Elsevier Interactive Patient Education  2018 [employer]  

## 2018-06-01 ENCOUNTER — Encounter (HOSPITAL_COMMUNITY): Payer: Self-pay | Admitting: Surgery

## 2018-06-02 NOTE — Anesthesia Postprocedure Evaluation (Signed)
Anesthesia Post Note  Patient: Jacquelynne Guedes  Procedure(s) Performed: LAPAROSCOPIC VENTRAL / LATERAL HERNIA REPAIR (N/A Abdomen) INSERTION OF MESH (N/A Abdomen)     Patient location during evaluation: PACU Anesthesia Type: General Level of consciousness: awake and alert Pain management: pain level controlled Vital Signs Assessment: post-procedure vital signs reviewed and stable Respiratory status: spontaneous breathing, nonlabored ventilation, respiratory function stable and patient connected to nasal cannula oxygen Cardiovascular status: blood pressure returned to baseline and stable Postop Assessment: no apparent nausea or vomiting Anesthetic complications: no    Last Vitals:  Vitals:   05/30/18 1042 05/30/18 1322  BP: (!) 150/64 (!) 154/75  Pulse: (!) 59 62  Resp: 14 12  Temp: 37.2 C 36.8 C  SpO2: 97% 100%    Last Pain:  Vitals:   05/30/18 1322  TempSrc: Oral  PainSc:                  ROSE,GEORGE S

## 2018-06-17 ENCOUNTER — Telehealth: Payer: Self-pay | Admitting: Internal Medicine

## 2018-06-17 NOTE — Telephone Encounter (Signed)
Patient states that she went to her post op today and the surgeon was concerned about the sight and wanted to patient to have some labs drawn ( those listed), he asked her if her PCP could do it or he could send her to the hospital to have them done. I informed her that I would ask Dr. Aundra Dubin and someone will get back with her. She states she does have an up coming lab appt and would like to see if these labs could be added.

## 2018-06-17 NOTE — Telephone Encounter (Signed)
Copied from [place] [id number]. Topic: Appointment Scheduling - Scheduling Inquiry for Clinic >> Jun 17, 2018  4:05 PM Mylinda Latina, NT wrote: Reason for CRM: Patient called and states that she seen Dr. Zigmund Daniel after she had surgery ( hernia surgery) she states that she is having swelling around her incision site and she had a fever all weekend. The surgeon wanted her to get labs drawn by her PCP to see what is going on.  Consulted Tri[facility] nurse at [facility] advised to send message to office since surgeon already seen patient He wanted a CBC, Platelet count, and a AUT/ DIFF , Please call patient is you have further questions in reference to the lab orders and to schedule if possible [phone]# [phone]

## 2018-06-17 NOTE — Telephone Encounter (Signed)
Please advise 

## 2018-06-18 ENCOUNTER — Other Ambulatory Visit: Payer: Self-pay | Admitting: Internal Medicine

## 2018-06-18 DIAGNOSIS — R5082 Postprocedural fever: Secondary | ICD-10-CM

## 2018-06-18 NOTE — Telephone Encounter (Signed)
I have ordered urine cultures, blood cultures and CBC Please sch lab visit asap Friday   [place]

## 2018-06-19 NOTE — Telephone Encounter (Signed)
My chart message sent to inform patient.

## 2018-06-23 DIAGNOSIS — Z9889 Other specified postprocedural states: Secondary | ICD-10-CM | POA: Diagnosis not present

## 2018-06-26 ENCOUNTER — Ambulatory Visit: Payer: Medicare Other | Admitting: Internal Medicine

## 2018-08-07 DIAGNOSIS — Z23 Encounter for immunization: Secondary | ICD-10-CM | POA: Diagnosis not present

## 2018-09-08 ENCOUNTER — Encounter: Payer: Self-pay | Admitting: Internal Medicine

## 2018-09-08 ENCOUNTER — Ambulatory Visit (INDEPENDENT_AMBULATORY_CARE_PROVIDER_SITE_OTHER): Payer: Medicare Other | Admitting: Internal Medicine

## 2018-09-08 ENCOUNTER — Ambulatory Visit (INDEPENDENT_AMBULATORY_CARE_PROVIDER_SITE_OTHER): Payer: Medicare Other

## 2018-09-08 ENCOUNTER — Encounter

## 2018-09-08 VITALS — BP 130/72 | HR 83 | Temp 98.3°F | Resp 15 | Ht 59.2 in | Wt 127.8 lb

## 2018-09-08 VITALS — BP 130/72 | HR 83 | Temp 98.3°F | Resp 15 | Ht 59.2 in | Wt 127.0 lb

## 2018-09-08 DIAGNOSIS — H6991 Unspecified Eustachian tube disorder, right ear: Secondary | ICD-10-CM | POA: Diagnosis not present

## 2018-09-08 DIAGNOSIS — Z1231 Encounter for screening mammogram for malignant neoplasm of breast: Secondary | ICD-10-CM

## 2018-09-08 DIAGNOSIS — E559 Vitamin D deficiency, unspecified: Secondary | ICD-10-CM | POA: Diagnosis not present

## 2018-09-08 DIAGNOSIS — E785 Hyperlipidemia, unspecified: Secondary | ICD-10-CM | POA: Diagnosis not present

## 2018-09-08 DIAGNOSIS — Z853 Personal history of malignant neoplasm of breast: Secondary | ICD-10-CM

## 2018-09-08 DIAGNOSIS — E039 Hypothyroidism, unspecified: Secondary | ICD-10-CM

## 2018-09-08 DIAGNOSIS — R300 Dysuria: Secondary | ICD-10-CM | POA: Diagnosis not present

## 2018-09-08 DIAGNOSIS — I10 Essential (primary) hypertension: Secondary | ICD-10-CM

## 2018-09-08 DIAGNOSIS — Z0000 Encounter for general adult medical examination without abnormal findings: Secondary | ICD-10-CM

## 2018-09-08 DIAGNOSIS — N390 Urinary tract infection, site not specified: Secondary | ICD-10-CM

## 2018-09-08 DIAGNOSIS — G4700 Insomnia, unspecified: Secondary | ICD-10-CM | POA: Diagnosis not present

## 2018-09-08 MED ORDER — ZOLPIDEM TARTRATE 5 MG PO TABS: 2.5000 mg | ORAL_TABLET | Freq: Every evening | ORAL | 0 refills | Status: DC | PRN

## 2018-09-08 NOTE — Progress Notes (Signed)
Agree with below note does not want to try antidepressants for now   [place]

## 2018-09-08 NOTE — Progress Notes (Signed)
Agree with below   TMS 

## 2018-09-08 NOTE — Patient Instructions (Addendum)
  Ms. Meenan , Thank you for taking time to come for your Medicare Wellness Visit. I appreciate your ongoing commitment to your health goals. Please review the following plan we discussed and let me know if I can assist you in the future.   These are the goals we discussed: Goals      Patient Stated   . Maintain Healthy Lifestyle (pt-stated)     Stay active Eat a healthy diet        This is a list of the screening recommended for you and due dates:  Health Maintenance  Topic Date Due  . DEXA scan (bone density measurement)  11/25/2011  . Tetanus Vaccine  03/27/2019*  . Pneumonia vaccines (2 of 2 - PPSV23) 12/29/2018  . Colon Cancer Screening  05/18/2019  . Mammogram  04/29/2020  . Flu Shot  Completed  .  Hepatitis C: One time screening is recommended by Center for Disease Control  (CDC) for  adults born from [birth year] through 1965.   Completed  *Topic was postponed. The date shown is not the original due date.

## 2018-09-08 NOTE — Progress Notes (Signed)
Subjective:   Maria Spencer is a 72 y.o. female who presents for an Initial Medicare Annual Wellness Visit.  Review of Systems    No ROS.  Medicare Wellness Visit. Additional risk factors are reflected in the social history.  Cardiac Risk Factors include: advanced age (>29men, >70 women);hypertension     Objective:    Today's Vitals   09/08/18 1334  BP: 130/72  Pulse: 83  Resp: 15  Temp: 98.3 F (36.8 C)  TempSrc: Oral  SpO2: 98%  Weight: 127 lb 12.8 oz (58 kg)  Height: 4' 11.2" (1.504 m)   Body mass index is 25.64 kg/m.  Advanced Directives 09/08/2018 05/28/2018 05/28/2018 05/27/2018 11/12/2017 10/29/2017  Does Patient Have a Medical Advance Directive? Yes - Yes Yes Yes Yes  Type of Paramedic of [place];Living will [place];Living will - [place];Living will Living will Living will  Does patient want to make changes to medical advance directive? No - Patient declined No - Patient declined - No - Patient declined No - Patient declined -  Copy of [place] in Chart? No - copy requested No - copy requested - - - -    Current Medications (verified) Outpatient Encounter Medications as of 09/08/2018  Medication Sig  . atorvastatin (LIPITOR) 20 MG tablet Take 0.5 tablets (10 mg total) by mouth daily at 6 PM.  . Cholecalciferol (VITAMIN D3 PO) Take 1 capsule by mouth daily.  . fluticasone (FLONASE) 50 MCG/ACT nasal spray Place 1 spray into both nostrils as needed for allergies or rhinitis.  Marland Kitchen levothyroxine (SYNTHROID, LEVOTHROID) 88 MCG tablet Take 1 tablet (88 mcg total) by mouth daily before breakfast.  . lisinopril (PRINIVIL,ZESTRIL) 20 MG tablet Take 1 tablet (20 mg total) by mouth daily.  . Multiple Vitamin (MULTI-VITAMINS) TABS Take 1 tablet by mouth daily.   . [DISCONTINUED] oxyCODONE (OXY IR/ROXICODONE) 5 MG immediate release tablet Take 5 mg by mouth 2 (two) times daily as needed for severe  pain.  . [DISCONTINUED] oxyCODONE-acetaminophen (PERCOCET/ROXICET) 5-325 MG tablet Take 1-2 tablets by mouth every 4 (four) hours as needed for moderate pain.  . [DISCONTINUED] zolpidem (AMBIEN) 5 MG tablet Take 2.5 mg by mouth at bedtime as needed for sleep.    No facility-administered encounter medications on file as of 09/08/2018.     Allergies (verified) Sulfa antibiotics   History: Past Medical History:  Diagnosis Date  . Arthritis    neck, back, left knee;   . Breast cancer [facility]) 2009   right lumpectomy s/p radiation   . Chicken pox   . Chronic UTI    established with urology   . Diverticular disease    -osis and -itis   . GERD (gastroesophageal reflux disease)   . Hormone disorder   . Hyperlipidemia   . Hypertension    controlled well with medication;   . Personal history of radiation therapy 2009   F/U right breast cancer  . Thyroid disease    hypothyroidism    Past Surgical History:  Procedure Laterality Date  . BONE GRAFT HIP ILIAC CREST     + cage left hip 10/23/17   . BREAST BIOPSY Left 2009   clip,benign  . BREAST BIOPSY Right 2009   +  . BREAST LUMPECTOMY Right 2009   2009 lumpectomy   . INSERTION OF MESH N/A 05/29/2018   Procedure: INSERTION OF MESH;  Surgeon: Johnathan Hausen, MD;  Location: [facility];  Service: General;  Laterality:  N/A;  . SHOULDER SURGERY     x2 surgeries both shoulders, right shoulder replacement last in 2012   . SPINE SURGERY     spinal 09/2017 h/o scoliosis   . TONSILLECTOMY     age 73 y.o.   . TOTAL SHOULDER REPLACEMENT    . VENTRAL HERNIA REPAIR N/A 05/29/2018   Procedure: LAPAROSCOPIC VENTRAL / [place];  Surgeon: Johnathan Hausen, MD;  Location: [facility];  Service: General;  Laterality: N/A;   Family History  Problem Relation Age of Onset  . Arthritis Mother   . Heart disease Mother   . Hyperlipidemia Mother   . Hypertension Mother   . Arthritis Father   . Diabetes Father   . Cancer Father        colon  .  Arthritis Sister   . Hyperlipidemia Sister   . Hypertension Sister   . Cancer Brother        lung, smoker  . Mental retardation Brother   . Drug abuse Daughter   . Arthritis Sister   . Hyperlipidemia Sister   . Bladder Cancer Neg Hx   . Kidney cancer Neg Hx    Social History   Socioeconomic History  . Marital status: Married    Spouse name: Not on file  . Number of children: Not on file  . Years of education: Not on file  . Highest education level: Not on file  Occupational History  . Not on file  Social Needs  . Financial resource strain: Not hard at all  . Food insecurity:    Worry: Never true    Inability: Never true  . Transportation needs:    Medical: No    Non-medical: No  Tobacco Use  . Smoking status: Never Smoker  . Smokeless tobacco: Never Used  Substance and Sexual Activity  . Alcohol use: Yes    Alcohol/week: 1.0 standard drinks    Types: 1 Glasses of wine per week    Comment: nightly  . Drug use: No  . Sexual activity: Not on file  Lifestyle  . Physical activity:    Days per week: 5 days    Minutes per session: 30 min  . Stress: Rather much  Relationships  . Social connections:    Talks on phone: Not on file    Gets together: Not on file    Attends religious service: Not on file    Active member of club or organization: Not on file    Attends meetings of clubs or organizations: Not on file    Relationship status: Married  Other Topics Concern  . Not on file  Social History Narrative   College grad   Married    Wears seatbelt and safe in relationship    3 kids        Tobacco Counseling Counseling given: Not Answered   Clinical Intake:  Pre-visit preparation completed: Yes  Pain : No/denies pain     Nutritional Status: BMI 25 -29 Overweight Diabetes: No  How often do you need to have someone help you when you read instructions, pamphlets, or other written materials from your doctor or pharmacy?: 1 - Never  Interpreter Needed?:  No      Activities of Daily Living In your present state of health, do you have any difficulty performing the following activities: 09/08/2018 05/28/2018  Hearing? N N  Vision? N N  Difficulty concentrating or making decisions? N N  Walking or climbing stairs? N N  Dressing  or bathing? N N  Doing errands, shopping? N N  Preparing Food and eating ? N -  Using the Toilet? N -  In the past six months, have you accidently leaked urine? N -  Do you have problems with loss of bowel control? N -  Managing your Medications? N -  Managing your Finances? N -  Housekeeping or managing your Housekeeping? N -  Some recent data might be hidden     Immunizations and Health Maintenance Immunization History  Administered Date(s) Administered  . Influenza Split 11/25/2005  . Influenza, High Dose Seasonal PF 08/02/2017, 08/07/2018  . Influenza-Unspecified 08/25/2017  . Pneumococcal Conjugate-13 12/29/2017   Health Maintenance Due  Topic Date Due  . DEXA SCAN  11/25/2011    Patient Care Team: McLean-Scocuzza, Nino Glow, MD as PCP - General (Internal Medicine) Johnathan Hausen, MD as Consulting Physician (General Surgery) Kaleen Mask, NP as Nurse Practitioner (Neurosurgery) [facility], Sonda Rumble, MD as Consulting Physician (Neurosurgery) Earl Many, MD as Consulting Physician (Urology)  Indicate any recent Medical Services you may have received from other than [facility] providers in the past year (date may be approximate).     Assessment:   This is a routine wellness examination for Maria Spencer.  The goal of the wellness visit is to assist the patient how to close the gaps in care and create a preventative care plan for the patient.   The roster of all physicians providing medical care to patient is listed in the Snapshot section of the chart.  Osteopenia. Taking calcium VIT D as appropriate/Osteoporosis risk reviewed.    Safety issues reviewed; Smoke and carbon monoxide detectors in the  home. No firearms in the home. Wears seatbelts when driving or riding with others. No violence in the home.  They do not have excessive sun exposure.  Discussed the need for sun protection: hats, long sleeves and the use of sunscreen if there is significant sun exposure.  Depression- PHQ 2 &9 complete.  Sad regarding husbands health and recent diagnosis. She is the caretaker. Difficulty sleeping, loss of appetite and depressed most times. Verbal report given to CMA; same day appointment with pcp scheduled.    No new identified risk were noted.  No failures at ADL's or IADL's.    BMI- discussed the importance of a healthy diet, water intake and the benefits of aerobic exercise.   Dental- every 12 months.  Eye- Visual acuity not assessed per patient preference since they have regular follow up with the ophthalmologist.  Wears corrective lenses.  Sleep patterns- Sleep is broken through the night. Not resting well.  Deferred to pcp for follow up.   Dexa Scan discussed. She believes it has been more than 3 years since last.  Deferred for follow up with pcp per patient preference.   Hearing/Vision screen Hearing Screening Comments: Patient is able to hear conversational tones without difficulty.  No issues reported.  Followed by Visits every 4 months  Hearing aid, bilateral Vision Screening Comments: Followed by Dr. Silverio Lay Wears corrective lenses Last OV 04/2017 Visual acuity not assessed per patient preference since they have regular follow up with the ophthalmologist  Dietary issues and exercise activities discussed: Current Exercise Habits: Home exercise routine, Type of exercise: walking;yoga, Time (Minutes): 30, Frequency (Times/Week): 5, Weekly Exercise (Minutes/Week): 150, Intensity: Mild  Goals      Patient Stated   . Maintain Healthy Lifestyle (pt-stated)     Stay active Eat a healthy diet  Depression Screen PHQ 2/9 Scores 09/08/2018 03/26/2018 12/29/2017  PHQ - 2  Score 2 0 0  PHQ- 9 Score 11 - -    Fall Risk Fall Risk  09/08/2018 03/26/2018 12/29/2017  Falls in the past year? No No No   Cognitive Function:     6CIT Screen 09/08/2018  What Year? 0 points  What month? 0 points  What time? 0 points  Count back from 20 0 points  Months in reverse 0 points  Repeat phrase 0 points  Total Score 0    Screening Tests Health Maintenance  Topic Date Due  . DEXA SCAN  11/25/2011  . TETANUS/TDAP  03/27/2019 (Originally 11/24/1965)  . PNA vac Low Risk Adult (2 of 2 - PPSV23) 12/29/2018  . COLONOSCOPY  05/18/2019  . MAMMOGRAM  04/29/2020  . INFLUENZA VACCINE  Completed  . Hepatitis C Screening  Completed     Plan:    End of life planning; Advance aging; Advanced directives discussed. Copy of current HCPOA/Living Will requested.    I have personally reviewed and noted the following in the patient's chart:   . Medical and social history . Use of alcohol, tobacco or illicit drugs  . Current medications and supplements . Functional ability and status . Nutritional status . Physical activity . Advanced directives . List of other physicians . Hospitalizations, surgeries, and ER visits in previous 12 months . Vitals . Screenings to include cognitive, depression, and falls . Referrals and appointments  In addition, I have reviewed and discussed with patient certain preventive protocols, quality metrics, and best practice recommendations. A written personalized care plan for preventive services as well as general preventive health recommendations were provided to patient.     Varney Biles, LPN   [id number]

## 2018-09-08 NOTE — Patient Instructions (Addendum)
Consider iron 325 mg daily with food  Vitamin D3 at least 1000 IU daily  Calcium at least 600 mg but rec 600 mg 2x per day   Try Cranberry with D mannose   Try Allegra, claritin, zyrtec or Xyzal at night to see if this helps your ears with Flonase   Eustachian Tube Dysfunction The eustachian tube connects the middle ear to the back of the nose. It regulates air pressure in the middle ear by allowing air to move between the ear and nose. It also helps to drain fluid from the middle ear space. When the eustachian tube does not function properly, air pressure, fluid, or both can build up in the middle ear. Eustachian tube dysfunction can affect one or both ears. What are the causes? This condition happens when the eustachian tube becomes blocked or cannot open normally. This may result from:  Ear infections.  Colds and other upper respiratory infections.  Allergies.  Irritation, such as from cigarette smoke or acid from the stomach coming up into the esophagus (gastroesophageal reflux).  Sudden changes in air pressure, such as from descending in an airplane.  Abnormal growths in the nose or throat, such as nasal polyps, tumors, or enlarged tissue at the back of the throat (adenoids).  What increases the risk? This condition may be more likely to develop in people who smoke and people who are overweight. Eustachian tube dysfunction may also be more likely to develop in children, especially children who have:  Certain birth defects of the mouth, such as cleft palate.  Large tonsils and adenoids.  What are the signs or symptoms? Symptoms of this condition may include:  A feeling of fullness in the ear.  Ear pain.  Clicking or popping noises in the ear.  Ringing in the ear.  Hearing loss.  Loss of balance.  Symptoms may get worse when the air pressure around you changes, such as when you travel to an area of high elevation or fly on an airplane. How is this diagnosed? This  condition may be diagnosed based on:  Your symptoms.  A physical exam of your ear, nose, and throat.  Tests, such as those that measure: ? The movement of your eardrum (tympanogram). ? Your hearing (audiometry).  How is this treated? Treatment depends on the cause and severity of your condition. If your symptoms are mild, you may be able to relieve your symptoms by moving air into ("popping") your ears. If you have symptoms of fluid in your ears, treatment may include:  Decongestants.  Antihistamines.  Nasal sprays or ear drops that contain medicines that reduce swelling (steroids).  In some cases, you may need to have a procedure to drain the fluid in your eardrum (myringotomy). In this procedure, a small tube is placed in the eardrum to:  Drain the fluid.  Restore the air in the middle ear space.  Follow these instructions at home:  Take over-the-counter and prescription medicines only as told by your health care provider.  Use techniques to help pop your ears as recommended by your health care provider. These may include: ? Chewing gum. ? Yawning. ? Frequent, forceful swallowing. ? Closing your mouth, holding your nose closed, and gently blowing as if you are trying to blow air out of your nose.  Do not do any of the following until your health care provider approves: ? Travel to high altitudes. ? Fly in airplanes. ? Work in a Pension scheme manager or room. ? Scuba dive.  Keep your ears dry. Dry your ears completely after showering or bathing.  Do not smoke.  Keep all follow-up visits as told by your health care provider. This is important. Contact a health care provider if:  Your symptoms do not go away after treatment.  Your symptoms come back after treatment.  You are unable to pop your ears.  You have: ? A fever. ? Pain in your ear. ? Pain in your head or neck. ? Fluid draining from your ear.  Your hearing suddenly changes.  You become very  dizzy.  You lose your balance. This information is not intended to replace advice given to you by your health care provider. Make sure you discuss any questions you have with your health care provider. Document Released: 12/08/2015 Document Revised: 04/18/2016 Document Reviewed: 11/30/2014 Elsevier Interactive Patient Education  2018 [employer].  Urinary Tract Infection, Adult A urinary tract infection (UTI) is an infection of any part of the urinary tract, which includes the kidneys, ureters, bladder, and urethra. These organs make, store, and get rid of urine in the body. UTI can be a bladder infection (cystitis) or kidney infection (pyelonephritis). What are the causes? This infection may be caused by fungi, viruses, or bacteria. Bacteria are the most common cause of UTIs. This condition can also be caused by repeated incomplete emptying of the bladder during urination. What increases the risk? This condition is more likely to develop if:  You ignore your need to urinate or hold urine for long periods of time.  You do not empty your bladder completely during urination.  You wipe back to front after urinating or having a bowel movement, if you are female.  You are uncircumcised, if you are female.  You are constipated.  You have a urinary catheter that stays in place (indwelling).  You have a weak defense (immune) system.  You have a medical condition that affects your bowels, kidneys, or bladder.  You have diabetes.  You take antibiotic medicines frequently or for long periods of time, and the antibiotics no longer work well against certain types of infections (antibiotic resistance).  You take medicines that irritate your urinary tract.  You are exposed to chemicals that irritate your urinary tract.  You are female.  What are the signs or symptoms? Symptoms of this condition include:  Fever.  Frequent urination or passing small amounts of urine frequently.  Needing  to urinate urgently.  Pain or burning with urination.  Urine that smells bad or unusual.  Cloudy urine.  Pain in the lower abdomen or back.  Trouble urinating.  Blood in the urine.  Vomiting or being less hungry than normal.  Diarrhea or abdominal pain.  Vaginal discharge, if you are female.  How is this diagnosed? This condition is diagnosed with a medical history and physical exam. You will also need to provide a urine sample to test your urine. Other tests may be done, including:  Blood tests.  Sexually transmitted disease (STD) testing.  If you have had more than one UTI, a cystoscopy or imaging studies may be done to determine the cause of the infections. How is this treated? Treatment for this condition often includes a combination of two or more of the following:  Antibiotic medicine.  Other medicines to treat less common causes of UTI.  Over-the-counter medicines to treat pain.  Drinking enough water to stay hydrated.  Follow these instructions at home:  Take over-the-counter and prescription medicines only as told by your health  care provider.  If you were prescribed an antibiotic, take it as told by your health care provider. Do not stop taking the antibiotic even if you start to feel better.  Avoid alcohol, caffeine, tea, and carbonated beverages. They can irritate your bladder.  Drink enough fluid to keep your urine clear or pale yellow.  Keep all follow-up visits as told by your health care provider. This is important.  Make sure to: ? Empty your bladder often and completely. Do not hold urine for long periods of time. ? Empty your bladder before and after sex. ? Wipe from front to back after a bowel movement if you are female. Use each tissue one time when you wipe. Contact a health care provider if:  You have back pain.  You have a fever.  You feel nauseous or vomit.  Your symptoms do not get better after 3 days.  Your symptoms go away  and then return. Get help right away if:  You have severe back pain or lower abdominal pain.  You are vomiting and cannot keep down any medicines or water. This information is not intended to replace advice given to you by your health care provider. Make sure you discuss any questions you have with your health care provider. Document Released: 08/21/2005 Document Revised: 04/24/2016 Document Reviewed: 10/02/2015 Elsevier Interactive Patient Education  [employer].

## 2018-09-08 NOTE — Progress Notes (Signed)
Chief Complaint  Patient presents with  . Follow-up   F/u  1. Depression + but declines medications due to having to take care of husband. Insomnia wants refill of ambien 2.5 mg qd prn  2. Right ear abnormal sensation h/o allergies  3. C/o dysuria h/o UTI check urine today  4. HTN controlled on liso 20 mg qd   Review of Systems  Constitutional: Negative for weight loss.  HENT: Negative for ear pain and hearing loss.   Eyes: Negative for blurred vision.  Respiratory: Negative for shortness of breath.   Cardiovascular: Negative for chest pain.  Gastrointestinal: Negative for abdominal pain.  Skin: Negative for rash.  Neurological: Negative for headaches.  Psychiatric/Behavioral: Positive for depression. The patient has insomnia.    Past Medical History:  Diagnosis Date  . Arthritis    neck, back, left knee;   . Breast cancer [facility]) 2009   right lumpectomy s/p radiation   . Chicken pox   . Chronic UTI    established with urology   . Diverticular disease    -osis and -itis   . GERD (gastroesophageal reflux disease)   . Hormone disorder   . Hyperlipidemia   . Hypertension    controlled well with medication;   . Personal history of radiation therapy 2009   F/U right breast cancer  . Thyroid disease    hypothyroidism    Past Surgical History:  Procedure Laterality Date  . BONE GRAFT HIP ILIAC CREST     + cage left hip 10/23/17   . BREAST BIOPSY Left 2009   clip,benign  . BREAST BIOPSY Right 2009   +  . BREAST LUMPECTOMY Right 2009   2009 lumpectomy   . INSERTION OF MESH N/A 05/29/2018   Procedure: INSERTION OF MESH;  Surgeon: Johnathan Hausen, MD;  Location: [facility];  Service: General;  Laterality: N/A;  . SHOULDER SURGERY     x2 surgeries both shoulders, right shoulder replacement last in 2012   . SPINE SURGERY     spinal 09/2017 h/o scoliosis   . TONSILLECTOMY     age 47 y.o.   . TOTAL SHOULDER REPLACEMENT    . VENTRAL HERNIA REPAIR N/A 05/29/2018   Procedure:  LAPAROSCOPIC VENTRAL / [place];  Surgeon: Johnathan Hausen, MD;  Location: [facility];  Service: General;  Laterality: N/A;   Family History  Problem Relation Age of Onset  . Arthritis Mother   . Heart disease Mother   . Hyperlipidemia Mother   . Hypertension Mother   . Arthritis Father   . Diabetes Father   . Cancer Father        colon  . Arthritis Sister   . Hyperlipidemia Sister   . Hypertension Sister   . Cancer Brother        lung, smoker  . Mental retardation Brother   . Drug abuse Daughter   . Arthritis Sister   . Hyperlipidemia Sister   . Bladder Cancer Neg Hx   . Kidney cancer Neg Hx    Social History   Socioeconomic History  . Marital status: Married    Spouse name: Not on file  . Number of children: Not on file  . Years of education: Not on file  . Highest education level: Not on file  Occupational History  . Not on file  Social Needs  . Financial resource strain: Not hard at all  . Food insecurity:    Worry: Never true    Inability: Never  true  . Transportation needs:    Medical: No    Non-medical: No  Tobacco Use  . Smoking status: Never Smoker  . Smokeless tobacco: Never Used  Substance and Sexual Activity  . Alcohol use: Yes    Alcohol/week: 1.0 standard drinks    Types: 1 Glasses of wine per week    Comment: nightly  . Drug use: No  . Sexual activity: Not on file  Lifestyle  . Physical activity:    Days per week: 5 days    Minutes per session: 30 min  . Stress: Rather much  Relationships  . Social connections:    Talks on phone: Not on file    Gets together: Not on file    Attends religious service: Not on file    Active member of club or organization: Not on file    Attends meetings of clubs or organizations: Not on file    Relationship status: Married  . Intimate partner violence:    Fear of current or ex partner: No    Emotionally abused: No    Physically abused: No    Forced sexual activity: No  Other Topics Concern   . Not on file  Social History Narrative   College grad   Married    Wears seatbelt and safe in relationship    3 kids       Current Meds  Medication Sig  . atorvastatin (LIPITOR) 20 MG tablet Take 0.5 tablets (10 mg total) by mouth daily at 6 PM.  . Cholecalciferol (VITAMIN D3 PO) Take 1 capsule by mouth daily.  . fluticasone (FLONASE) 50 MCG/ACT nasal spray Place 1 spray into both nostrils as needed for allergies or rhinitis.  Marland Kitchen levothyroxine (SYNTHROID, LEVOTHROID) 88 MCG tablet Take 1 tablet (88 mcg total) by mouth daily before breakfast.  . lisinopril (PRINIVIL,ZESTRIL) 20 MG tablet Take 1 tablet (20 mg total) by mouth daily.  . Multiple Vitamin (MULTI-VITAMINS) TABS Take 1 tablet by mouth daily.   Marland Kitchen zolpidem (AMBIEN) 5 MG tablet Take 2.5 mg by mouth at bedtime as needed for sleep.    Allergies  Allergen Reactions  . Sulfa Antibiotics Anaphylaxis, Hives and Itching   No results found for this or any previous visit (from the past 2160 hour(s)). Objective  There is no height or weight on file to calculate BMI. Wt Readings from Last 3 Encounters:  09/08/18 127 lb 12.8 oz (58 kg)  05/29/18 130 lb (59 kg)  05/27/18 130 lb 8 oz (59.2 kg)   Temp Readings from Last 3 Encounters:  09/08/18 98.3 F (36.8 C) (Oral)  05/30/18 98.2 F (36.8 C) (Oral)  05/27/18 98.1 F (36.7 C) (Oral)   BP Readings from Last 3 Encounters:  09/08/18 130/72  05/30/18 (!) 154/75  05/27/18 (!) 163/77   Pulse Readings from Last 3 Encounters:  09/08/18 83  05/30/18 62  05/27/18 73    Physical Exam  Constitutional: She is oriented to person, place, and time. Vital signs are normal. She appears well-developed and well-nourished. She is cooperative.  HENT:  Head: Normocephalic and atraumatic.  Mouth/Throat: Oropharynx is clear and moist and mucous membranes are normal.  Fluid in ears b/l mild   Eyes: Pupils are equal, round, and reactive to light. Conjunctivae are normal.  Cardiovascular:  Normal rate, regular rhythm and normal heart sounds.  Pulmonary/Chest: Effort normal and breath sounds normal.  Neurological: She is alert and oriented to person, place, and time. Gait normal.  Skin: Skin is  warm, dry and intact.  Psychiatric: She has a normal mood and affect. Her speech is normal and behavior is normal. Judgment and thought content normal. Cognition and memory are normal.  Nursing note and vitals reviewed.   Assessment   1. Depression  2. Eustachian tube dysfunction  3. H/o UTI  4. HTN controlled  5. HM Plan   1. Declines medications for now  2. Disc otc AH and flonase  3. UA and culture today  4. Cont lis 20 mg qd  5.  Had flu shot 08/07/18  prevnar had  Declines Tdap, shingrix, hep B vaccine for now  zostervax had 2012  Never smoker    s/p right breast lumpectomy 2009 and radiation only  -mammogram 04/29/18 negative  Pap out of age window no h/o abnormal pap  DEXA h/o osteopenia wait on repeat in future offer disc'ed again today  Colonoscopy copy obtained reviewed 01/10/09 grade 1 IH and diverticulosis  -colonoscopy due at f/u  rec healthy diet and exercise   Of note changing to [facility] 11/2018  Provider: Dr. Olivia Mackie McLean-Scocuzza-[facility] Medicine

## 2018-09-08 NOTE — Progress Notes (Signed)
Pre visit review using our clinic review tool, if applicable. No additional management support is needed unless otherwise documented below in the visit note. 

## 2018-09-10 ENCOUNTER — Other Ambulatory Visit: Payer: Self-pay | Admitting: Internal Medicine

## 2018-09-10 DIAGNOSIS — N3000 Acute cystitis without hematuria: Secondary | ICD-10-CM

## 2018-09-10 LAB — URINALYSIS, ROUTINE W REFLEX MICROSCOPIC
Bilirubin Urine: NEGATIVE
Glucose, UA: NEGATIVE
Hgb urine dipstick: NEGATIVE
Hyaline Cast: NONE SEEN /LPF
Ketones, ur: NEGATIVE
Nitrite: POSITIVE — AB
Protein, ur: NEGATIVE
Specific Gravity, Urine: 1.019 (ref 1.001–1.03)
WBC, UA: >=60 /HPF — AB (ref 0–5)
pH: 6 (ref 5.0–8.0)

## 2018-09-10 LAB — URINE CULTURE
MICRO NUMBER:: 91238539
SPECIMEN QUALITY:: ADEQUATE

## 2018-09-10 MED ORDER — CIPROFLOXACIN HCL 500 MG PO TABS: 500.0000 mg | ORAL_TABLET | Freq: Two times a day (BID) | ORAL | 0 refills | Status: DC

## 2018-09-15 ENCOUNTER — Other Ambulatory Visit (INDEPENDENT_AMBULATORY_CARE_PROVIDER_SITE_OTHER): Payer: Medicare Other

## 2018-09-15 ENCOUNTER — Other Ambulatory Visit: Payer: Self-pay | Admitting: Internal Medicine

## 2018-09-15 DIAGNOSIS — E559 Vitamin D deficiency, unspecified: Secondary | ICD-10-CM | POA: Diagnosis not present

## 2018-09-15 DIAGNOSIS — E039 Hypothyroidism, unspecified: Secondary | ICD-10-CM

## 2018-09-15 DIAGNOSIS — E785 Hyperlipidemia, unspecified: Secondary | ICD-10-CM | POA: Diagnosis not present

## 2018-09-15 DIAGNOSIS — N3000 Acute cystitis without hematuria: Secondary | ICD-10-CM

## 2018-09-15 DIAGNOSIS — I10 Essential (primary) hypertension: Secondary | ICD-10-CM | POA: Diagnosis not present

## 2018-09-15 DIAGNOSIS — N179 Acute kidney failure, unspecified: Secondary | ICD-10-CM

## 2018-09-15 LAB — COMPREHENSIVE METABOLIC PANEL
ALT: 13 U/L (ref 0–35)
AST: 17 U/L (ref 0–37)
Albumin: 4.1 g/dL (ref 3.5–5.2)
Alkaline Phosphatase: 77 U/L (ref 39–117)
BUN: 52 mg/dL — ABNORMAL HIGH (ref 6–23)
CO2: 27 mEq/L (ref 19–32)
Calcium: 9.2 mg/dL (ref 8.4–10.5)
Chloride: 106 mEq/L (ref 96–112)
Creatinine, Ser: 2.3 mg/dL — ABNORMAL HIGH (ref 0.40–1.20)
GFR: 22.17 mL/min — ABNORMAL LOW (ref >60.00)
Glucose, Bld: 106 mg/dL — ABNORMAL HIGH (ref 70–99)
Potassium: 4 mEq/L (ref 3.5–5.1)
Sodium: 142 mEq/L (ref 135–145)
Total Bilirubin: 0.6 mg/dL (ref 0.2–1.2)
Total Protein: 6.9 g/dL (ref 6.0–8.3)

## 2018-09-15 LAB — CBC WITH DIFFERENTIAL/PLATELET
Basophils Absolute: 0.1 10*3/uL (ref 0.0–0.1)
Basophils Relative: 0.9 % (ref 0.0–3.0)
Eosinophils Absolute: 0.2 10*3/uL (ref 0.0–0.7)
Eosinophils Relative: 3.3 % (ref 0.0–5.0)
HCT: 39.6 % (ref 36.0–46.0)
Hemoglobin: 13 g/dL (ref 12.0–15.0)
Lymphocytes Relative: 12.7 % (ref 12.0–46.0)
Lymphs Abs: 0.8 10*3/uL (ref 0.7–4.0)
MCHC: 32.8 g/dL (ref 30.0–36.0)
MCV: 83.3 fl (ref 78.0–100.0)
Monocytes Absolute: 0.5 10*3/uL (ref 0.1–1.0)
Monocytes Relative: 7.6 % (ref 3.0–12.0)
Neutro Abs: 5 10*3/uL (ref 1.4–7.7)
Neutrophils Relative %: 75.5 % (ref 43.0–77.0)
Platelets: 209 10*3/uL (ref 150.0–400.0)
RBC: 4.75 Mil/uL (ref 3.87–5.11)
RDW: 17.6 % — ABNORMAL HIGH (ref 11.5–15.5)
WBC: 6.6 10*3/uL (ref 4.0–10.5)

## 2018-09-15 LAB — LIPID PANEL
Cholesterol: 169 mg/dL (ref 0–200)
HDL: 70.1 mg/dL (ref >39.00)
LDL Cholesterol: 83 mg/dL (ref 0–99)
NonHDL: 98.56
Total CHOL/HDL Ratio: 2
Triglycerides: 78 mg/dL (ref 0.0–149.0)
VLDL: 15.6 mg/dL (ref 0.0–40.0)

## 2018-09-15 LAB — VITAMIN D 25 HYDROXY (VIT D DEFICIENCY, FRACTURES): VITD: 43.23 ng/mL (ref 30.00–100.00)

## 2018-09-15 LAB — TSH: TSH: 2.43 u[IU]/mL (ref 0.35–4.50)

## 2018-09-22 ENCOUNTER — Other Ambulatory Visit (INDEPENDENT_AMBULATORY_CARE_PROVIDER_SITE_OTHER): Payer: Medicare Other

## 2018-09-22 ENCOUNTER — Other Ambulatory Visit: Payer: Medicare Other

## 2018-09-22 DIAGNOSIS — N3000 Acute cystitis without hematuria: Secondary | ICD-10-CM

## 2018-09-22 DIAGNOSIS — N179 Acute kidney failure, unspecified: Secondary | ICD-10-CM

## 2018-09-22 LAB — URINALYSIS, ROUTINE W REFLEX MICROSCOPIC
Bilirubin Urine: NEGATIVE
Hgb urine dipstick: NEGATIVE
Ketones, ur: NEGATIVE
Leukocytes, UA: NEGATIVE
Nitrite: NEGATIVE
RBC / HPF: NONE SEEN (ref 0–?)
Specific Gravity, Urine: 1.015 (ref 1.000–1.030)
Total Protein, Urine: NEGATIVE
Urine Glucose: NEGATIVE
Urobilinogen, UA: 0.2 (ref 0.0–1.0)
pH: 5.5 (ref 5.0–8.0)

## 2018-09-22 LAB — BASIC METABOLIC PANEL
BUN: 18 mg/dL (ref 6–23)
CO2: 28 mEq/L (ref 19–32)
Calcium: 9.4 mg/dL (ref 8.4–10.5)
Chloride: 103 mEq/L (ref 96–112)
Creatinine, Ser: 0.92 mg/dL (ref 0.40–1.20)
GFR: 63.81 mL/min (ref >60.00)
Glucose, Bld: 113 mg/dL — ABNORMAL HIGH (ref 70–99)
Potassium: 3.5 mEq/L (ref 3.5–5.1)
Sodium: 139 mEq/L (ref 135–145)

## 2018-09-23 LAB — URINE CULTURE
MICRO NUMBER:: 91300246
SPECIMEN QUALITY:: ADEQUATE

## 2018-09-24 ENCOUNTER — Other Ambulatory Visit: Payer: Self-pay | Admitting: Internal Medicine

## 2018-09-24 DIAGNOSIS — I10 Essential (primary) hypertension: Secondary | ICD-10-CM

## 2018-09-24 MED ORDER — AMLODIPINE BESYLATE 5 MG PO TABS: 5.0000 mg | ORAL_TABLET | Freq: Every day | ORAL | 1 refills | Status: DC

## 2018-10-09 ENCOUNTER — Encounter: Payer: Self-pay | Admitting: Radiology

## 2018-10-09 ENCOUNTER — Observation Stay
Admission: EM | Admit: 2018-10-09 | Discharge: 2018-10-10 | Disposition: A | Discharge status: Home / Self Care | Payer: Medicare Other | Attending: Internal Medicine | Admitting: Internal Medicine

## 2018-10-09 ENCOUNTER — Other Ambulatory Visit: Payer: Self-pay

## 2018-10-09 ENCOUNTER — Emergency Department: Payer: Medicare Other

## 2018-10-09 DIAGNOSIS — Z79890 Hormone replacement therapy (postmenopausal): Secondary | ICD-10-CM | POA: Insufficient documentation

## 2018-10-09 DIAGNOSIS — Z923 Personal history of irradiation: Secondary | ICD-10-CM | POA: Insufficient documentation

## 2018-10-09 DIAGNOSIS — C50919 Malignant neoplasm of unspecified site of unspecified female breast: Secondary | ICD-10-CM | POA: Diagnosis present

## 2018-10-09 DIAGNOSIS — Z79899 Other long term (current) drug therapy: Secondary | ICD-10-CM | POA: Diagnosis not present

## 2018-10-09 DIAGNOSIS — Z853 Personal history of malignant neoplasm of breast: Secondary | ICD-10-CM | POA: Diagnosis not present

## 2018-10-09 DIAGNOSIS — I10 Essential (primary) hypertension: Secondary | ICD-10-CM | POA: Diagnosis not present

## 2018-10-09 DIAGNOSIS — R0789 Other chest pain: Secondary | ICD-10-CM | POA: Diagnosis not present

## 2018-10-09 DIAGNOSIS — E785 Hyperlipidemia, unspecified: Secondary | ICD-10-CM | POA: Diagnosis not present

## 2018-10-09 DIAGNOSIS — J329 Chronic sinusitis, unspecified: Secondary | ICD-10-CM | POA: Diagnosis not present

## 2018-10-09 DIAGNOSIS — Z96611 Presence of right artificial shoulder joint: Secondary | ICD-10-CM | POA: Insufficient documentation

## 2018-10-09 DIAGNOSIS — R079 Chest pain, unspecified: Secondary | ICD-10-CM | POA: Diagnosis not present

## 2018-10-09 DIAGNOSIS — N390 Urinary tract infection, site not specified: Secondary | ICD-10-CM | POA: Diagnosis not present

## 2018-10-09 DIAGNOSIS — E876 Hypokalemia: Secondary | ICD-10-CM | POA: Insufficient documentation

## 2018-10-09 DIAGNOSIS — Z23 Encounter for immunization: Secondary | ICD-10-CM | POA: Diagnosis not present

## 2018-10-09 DIAGNOSIS — M940 Chondrocostal junction syndrome [Tietze]: Secondary | ICD-10-CM | POA: Diagnosis present

## 2018-10-09 DIAGNOSIS — E039 Hypothyroidism, unspecified: Secondary | ICD-10-CM | POA: Insufficient documentation

## 2018-10-09 LAB — BASIC METABOLIC PANEL
Anion gap: 9 (ref 5–15)
BUN: 14 mg/dL (ref 8–23)
CO2: 29 mmol/L (ref 22–32)
Calcium: 9.5 mg/dL (ref 8.9–10.3)
Chloride: 102 mmol/L (ref 98–111)
Creatinine, Ser: 0.54 mg/dL (ref 0.44–1.00)
GFR calc Af Amer: >60 mL/min (ref >60)
GFR calc non Af Amer: >60 mL/min (ref >60)
Glucose, Bld: 111 mg/dL — ABNORMAL HIGH (ref 70–99)
Potassium: 3.4 mmol/L — ABNORMAL LOW (ref 3.5–5.1)
Sodium: 140 mmol/L (ref 135–145)

## 2018-10-09 LAB — CBC
HCT: 43.2 % (ref 36.0–46.0)
Hemoglobin: 13.6 g/dL (ref 12.0–15.0)
MCH: 26.9 pg (ref 26.0–34.0)
MCHC: 31.5 g/dL (ref 30.0–36.0)
MCV: 85.5 fL (ref 80.0–100.0)
Platelets: 208 10*3/uL (ref 150–400)
RBC: 5.05 MIL/uL (ref 3.87–5.11)
RDW: 15.9 % — ABNORMAL HIGH (ref 11.5–15.5)
WBC: 5.7 10*3/uL (ref 4.0–10.5)
nRBC: 0 % (ref 0.0–0.2)

## 2018-10-09 LAB — TROPONIN I
Troponin I: <0.03 ng/mL (ref <0.03)
Troponin I: <0.03 ng/mL (ref <0.03)

## 2018-10-09 IMAGING — CT CT ANGIO CHEST
2 of 6 series · 18 of 46 positions shown · IV contrast (APPLIED)
Comparison: CXR [DATE]

CLINICAL DATA: Chest pressure and discomfort for 3 weeks

EXAM:
CT ANGIOGRAPHY CHEST WITH CONTRAST
TECHNIQUE: Multidetector CT imaging of the chest was performed using the
standard protocol during bolus administration of intravenous
contrast. Multiplanar CT image reconstructions and MIPs were
obtained to evaluate the vascular anatomy.
CONTRAST:  75mL OMNIPAQUE IOHEXOL 350 MG/ML SOLN

[Series 5: thins · axial · 0.61mm/px · z∈[-656,-442]mm · 16 of 236 slices shown]
[im 11/236  lung]
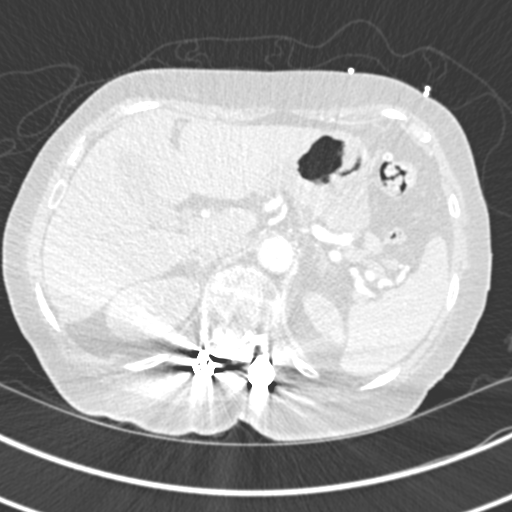
[im 31/236  soft-tissue]
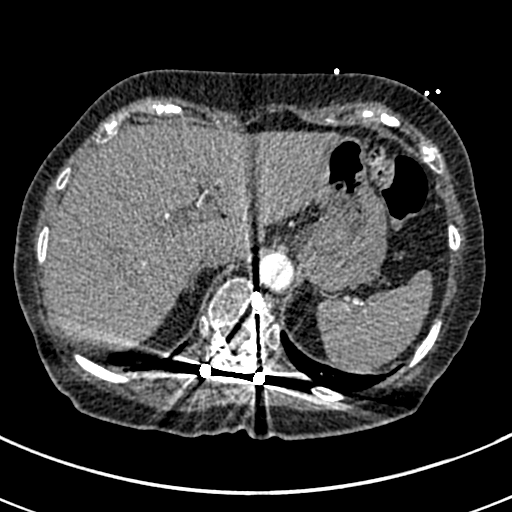
[im 41/236  lung]
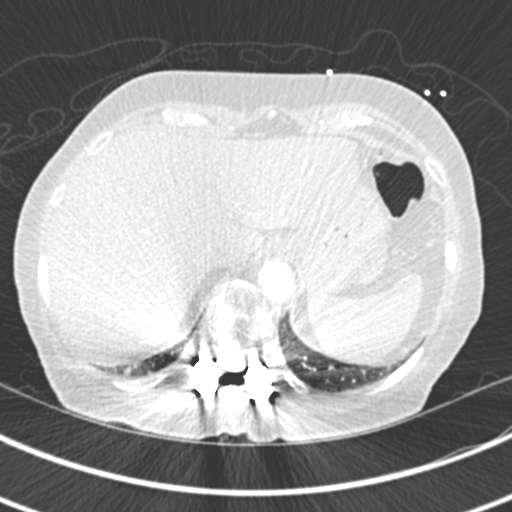
[im 52/236  soft-tissue]
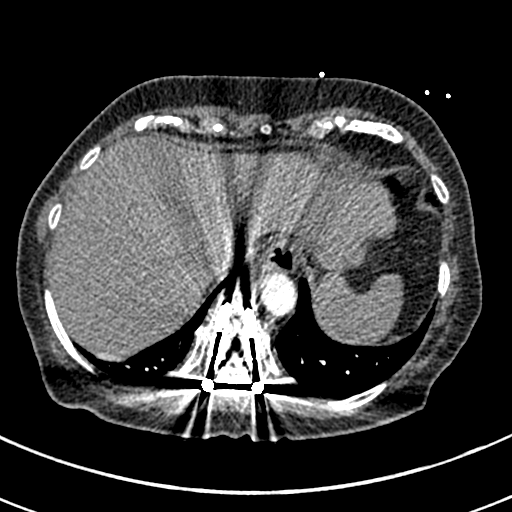
[im 72/236  lung]
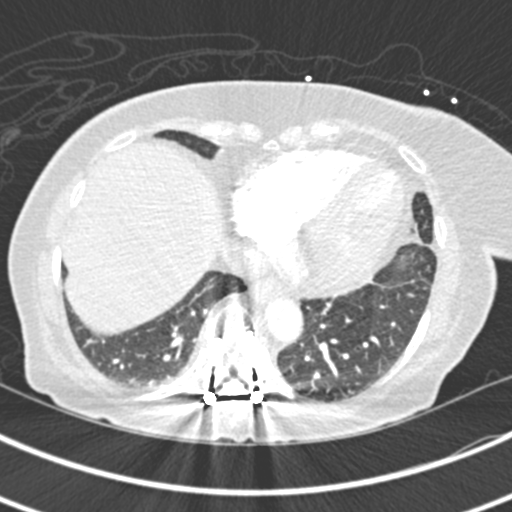
[im 82/236  soft-tissue]
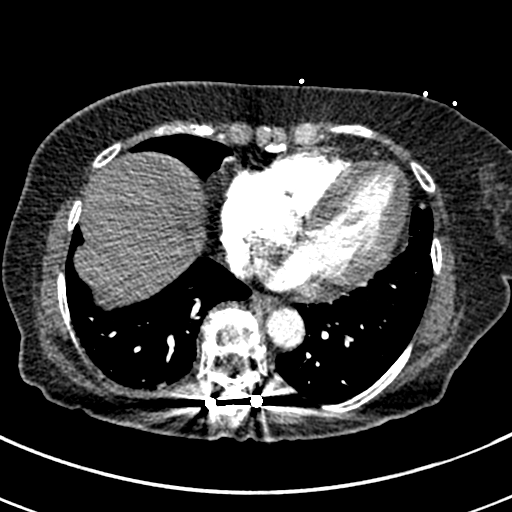
[im 92/236  lung]
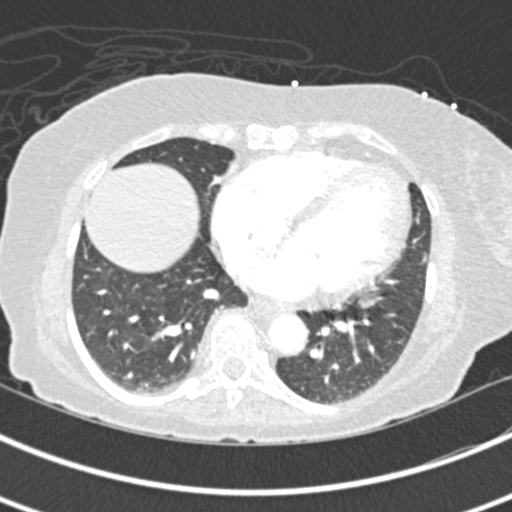
[im 113/236  soft-tissue]
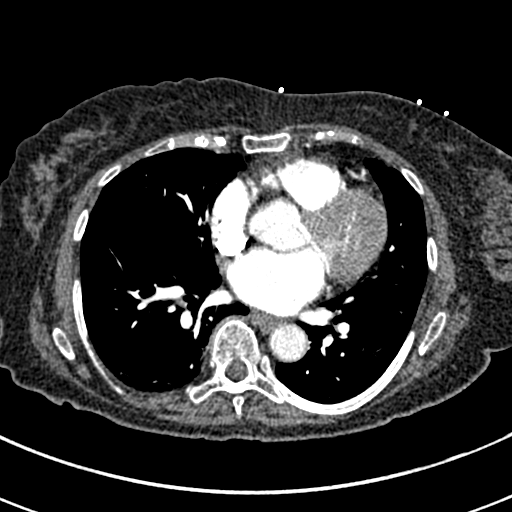
[im 123/236  lung]
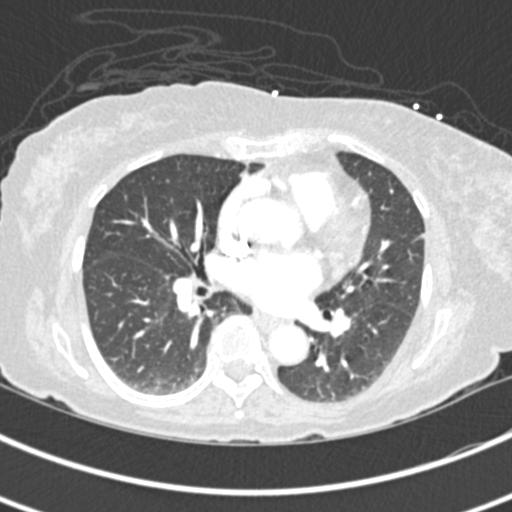
[im 144/236  soft-tissue]
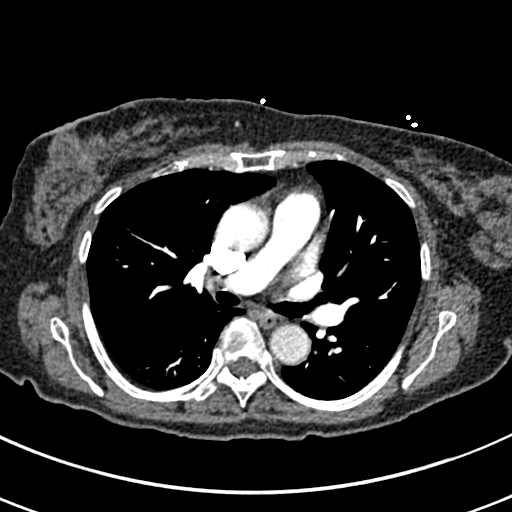
[im 154/236  lung]
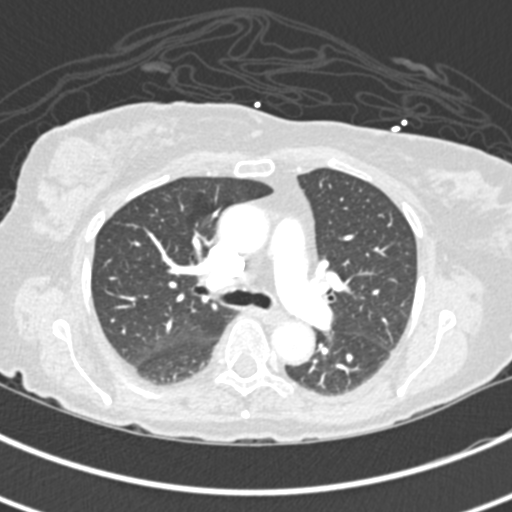
[im 164/236  soft-tissue]
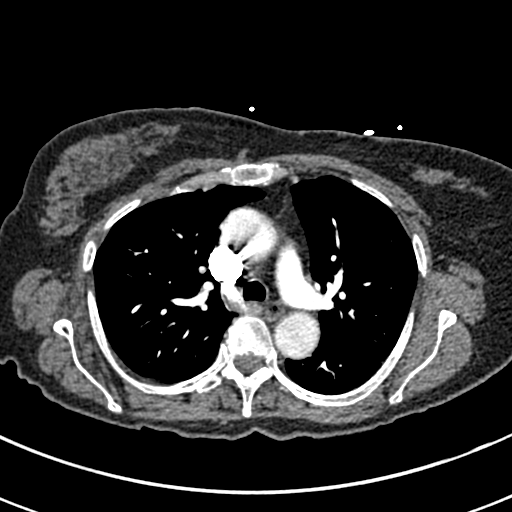
[im 184/236  lung]
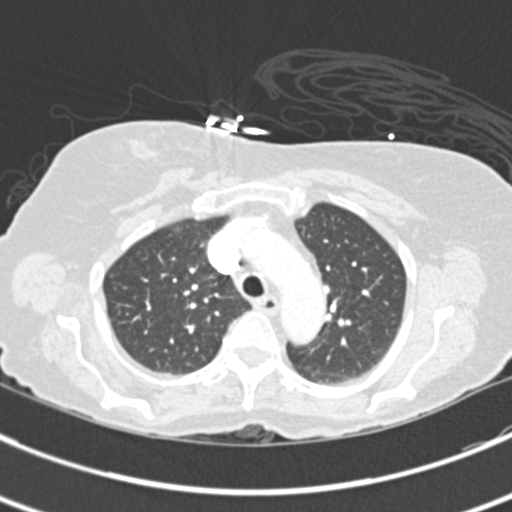
[im 195/236  soft-tissue]
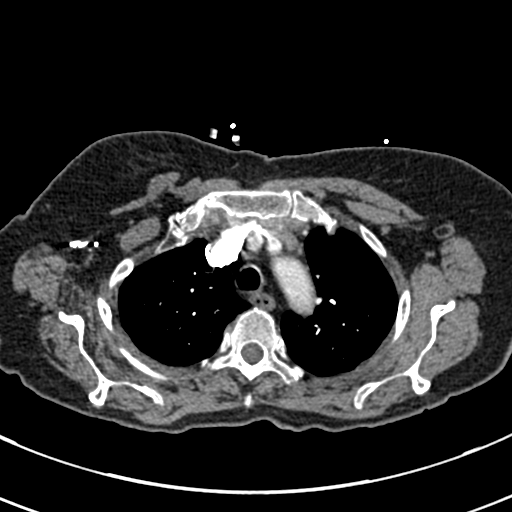
[im 205/236  lung]
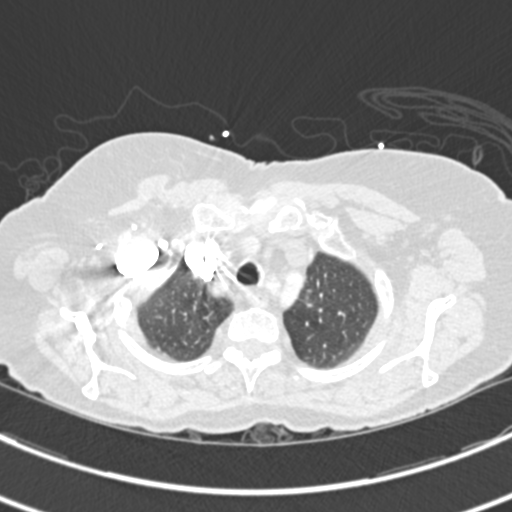
[im 225/236  soft-tissue]
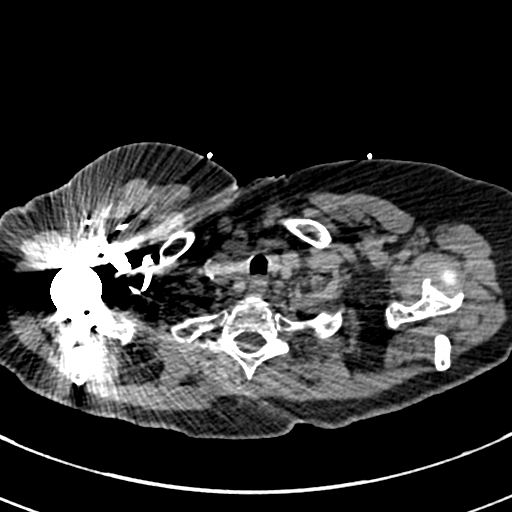

[Series 7: coronal mpr · coronal · 0.49mm/px · 2 of 69 slices shown]
[im 23/69  soft-tissue]
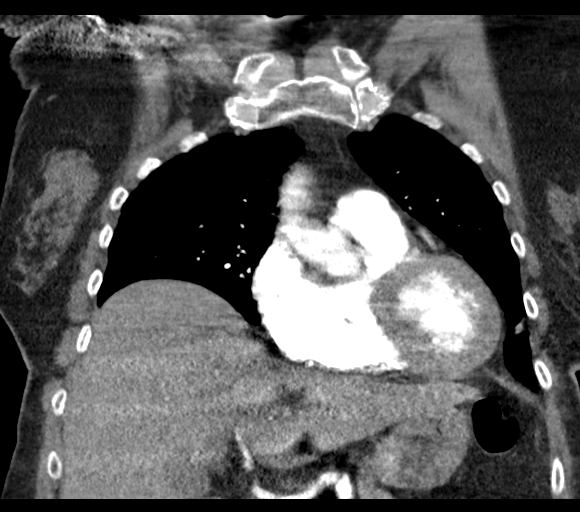
[im 46/69  soft-tissue]
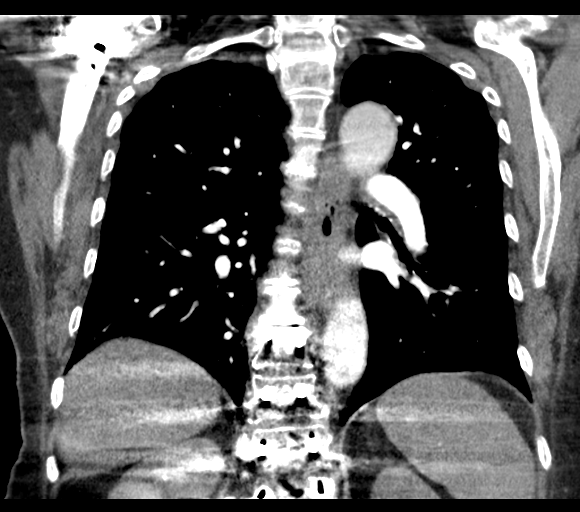

[18 of 46 positions shown; findings below may reference images not displayed]

FINDINGS: Cardiovascular: Borderline cardiomegaly without pericardial
effusion. Nonaneurysmal atherosclerotic aorta without dissection.
Conventional branch pattern of the great vessels without stenosis.
Satisfactory opacification of pulmonary arteries without acute
pulmonary embolus.

Mediastinum/Nodes: No enlarged mediastinal, hilar, or axillary lymph
nodes. Thyroid gland, trachea, and esophagus demonstrate no
significant findings.

Lungs/Pleura: Minimal right apical pleuroparenchymal scarring. No
dominant mass, pulmonary consolidation or pneumothorax. No effusion.
Dependent atelectasis at the right base. Subsegmental atelectasis
noted in the lingula and left lower lobe.

Upper Abdomen: No acute abnormality.

Musculoskeletal: Posterior spinal fusion hardware noted of the lower
thoracic spine extending caudad into the lumbar spine. There is
thoracolumbar spondylosis noted without acute osseous appearing
abnormality. Right shoulder arthroplasty is partially included.
Macroscopic calcifications noted within the the right breast
possibly representing stigmata of degenerating fibroadenomas.
Correlation with the patient's mammograms recommended for further
disposition.

Review of the MIP images confirms the above findings.
IMPRESSION: 1. No acute pulmonary embolus, aortic aneurysm or dissection.
2. No active pulmonary disease.

Aortic Atherosclerosis ([EY]-[EY]).

## 2018-10-09 IMAGING — CR DG CHEST 2V
2 series · 2 of 2 positions shown · non-contrast
Comparison: None.

CLINICAL DATA: One month history of chest pain. Sinus congestion
since [REDACTED]. History of hypertension, nonsmoker.

EXAM:
CHEST - 2 VIEW

[chest pa]
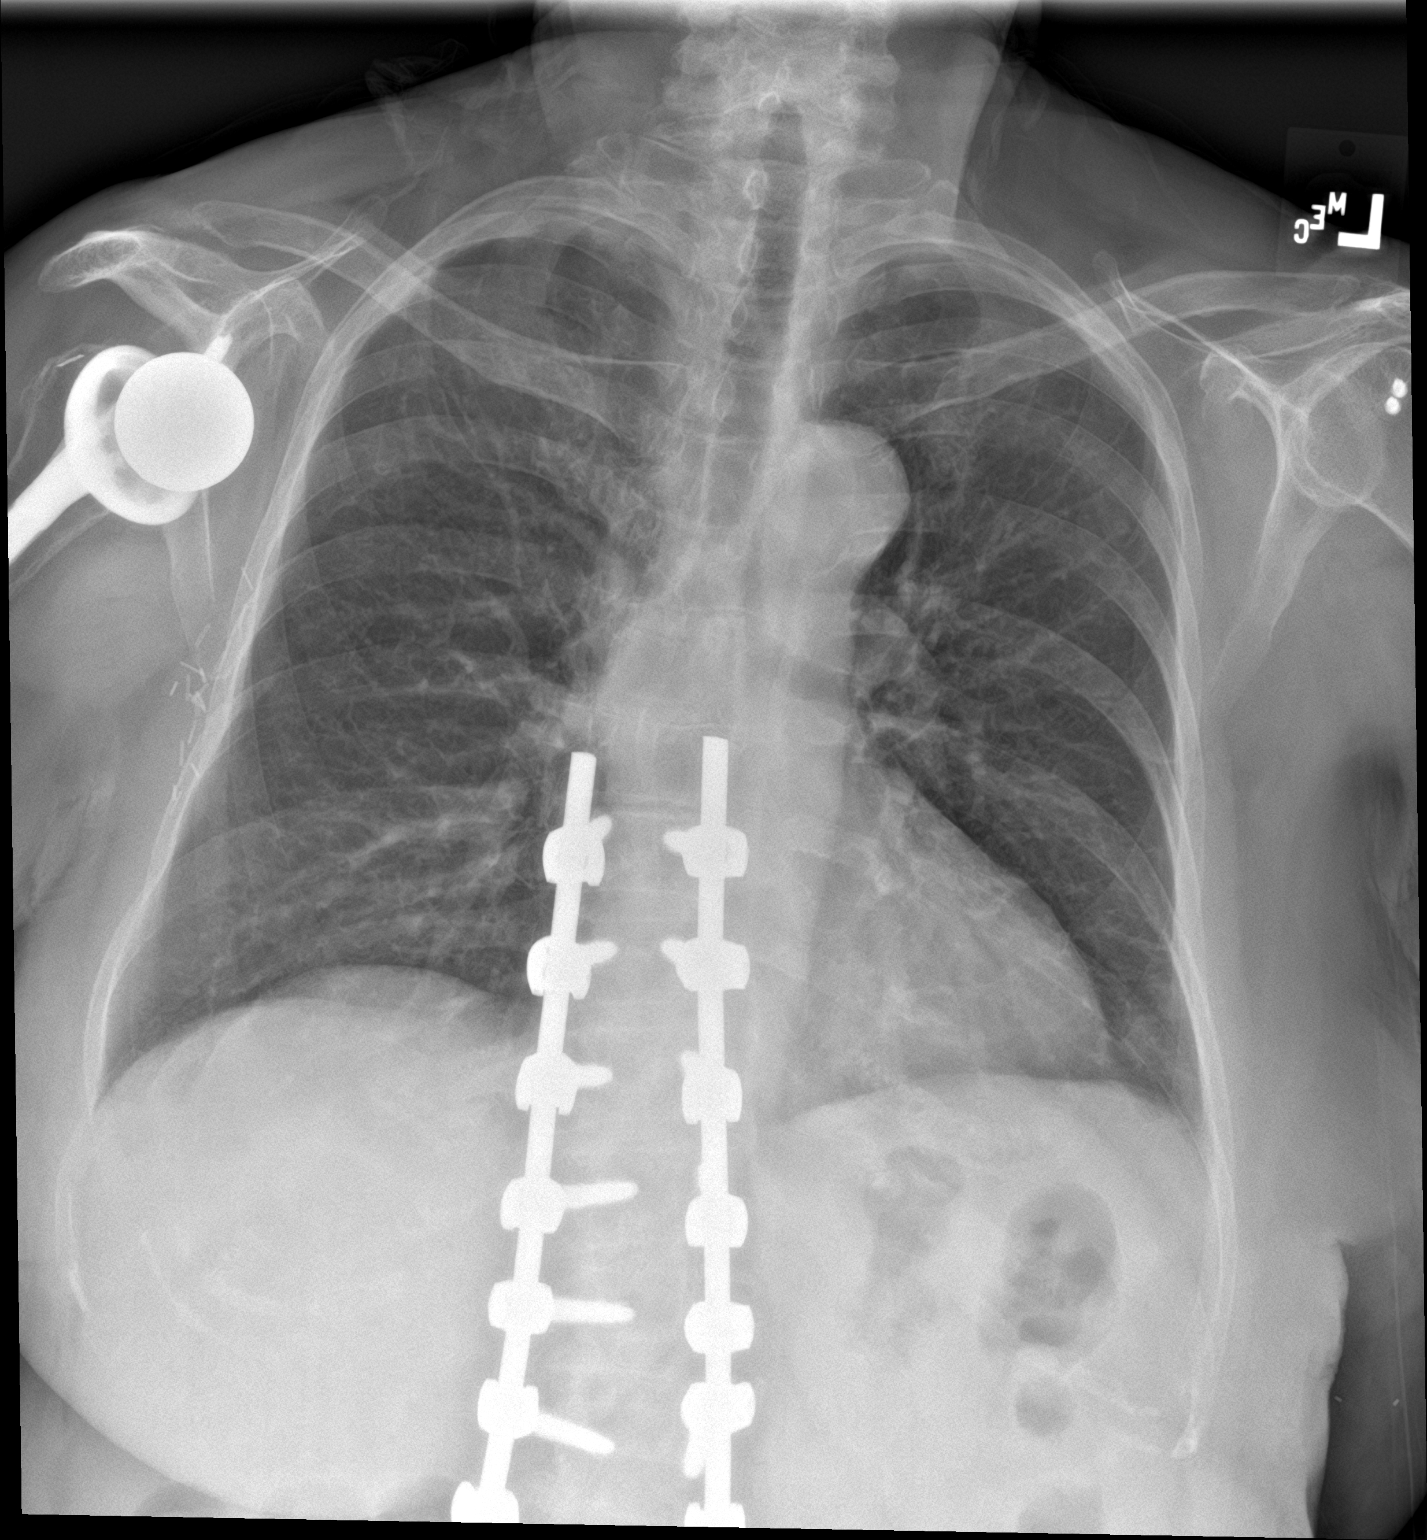

[chest lat]
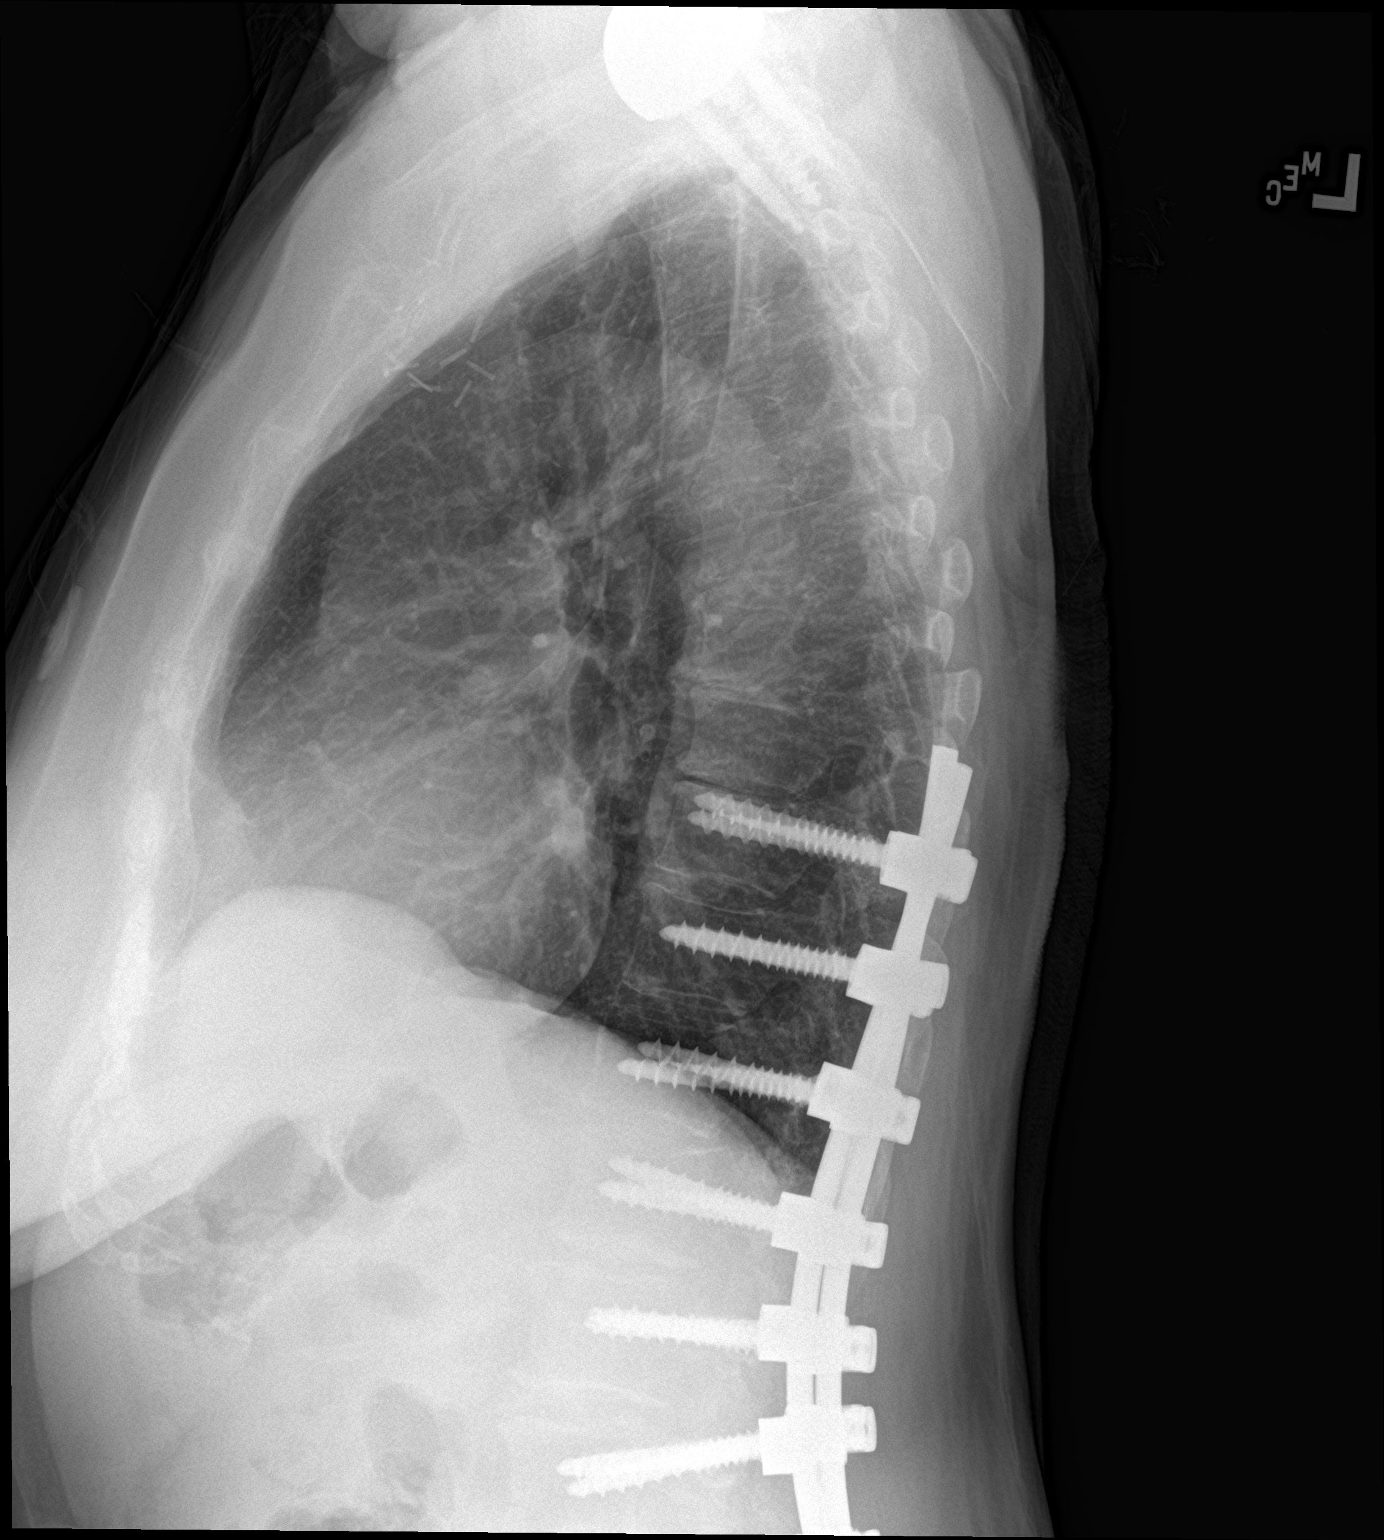

[2 of 2 positions shown; findings below may reference images not displayed]

FINDINGS: The lungs are adequately inflated. There is no focal infiltrate.
There is no pleural effusion. The heart and pulmonary vascularity
are normal. The mediastinum is normal in width. There is
calcification in the wall of the aortic arch. There is a prosthetic
right shoulder joint. The patient has undergone posterior fusion
from T9 inferiorly.
IMPRESSION: There is no acute cardiopulmonary abnormality.

Thoracic aortic atherosclerosis.

## 2018-10-09 MED ORDER — PNEUMOCOCCAL VAC POLYVALENT 25 MCG/0.5ML IJ INJ
0.5000 mL | INJECTION | INTRAMUSCULAR | Status: AC
  Administered 2018-10-10: 0.5 mL via INTRAMUSCULAR
  Filled 2018-10-09: qty 0.5

## 2018-10-09 MED ORDER — KETOROLAC TROMETHAMINE 30 MG/ML IJ SOLN
30.0000 mg | Freq: Once | INTRAMUSCULAR | Status: AC
  Administered 2018-10-09: 30 mg via INTRAVENOUS
  Filled 2018-10-09: qty 1

## 2018-10-09 MED ORDER — ATORVASTATIN CALCIUM 10 MG PO TABS: 10.0000 mg | ORAL_TABLET | Freq: Every day | ORAL | Status: DC

## 2018-10-09 MED ORDER — ZOLPIDEM TARTRATE 5 MG PO TABS
2.5000 mg | ORAL_TABLET | Freq: Every evening | ORAL | Status: DC | PRN
  Administered 2018-10-09: 2.5 mg via ORAL
  Filled 2018-10-09: qty 1

## 2018-10-09 MED ORDER — POTASSIUM CHLORIDE CRYS ER 20 MEQ PO TBCR
40.0000 meq | EXTENDED_RELEASE_TABLET | Freq: Once | ORAL | Status: AC
  Administered 2018-10-09: 40 meq via ORAL
  Filled 2018-10-09: qty 2

## 2018-10-09 MED ORDER — AMLODIPINE BESYLATE 5 MG PO TABS
5.0000 mg | ORAL_TABLET | Freq: Every day | ORAL | Status: DC
  Administered 2018-10-10: 5 mg via ORAL
  Filled 2018-10-09: qty 1

## 2018-10-09 MED ORDER — NITROGLYCERIN 0.4 MG SL SUBL
0.4000 mg | SUBLINGUAL_TABLET | SUBLINGUAL | Status: DC | PRN
  Administered 2018-10-09: 0.4 mg via SUBLINGUAL
  Filled 2018-10-09: qty 1

## 2018-10-09 MED ORDER — ACETAMINOPHEN 325 MG PO TABS: 650.0000 mg | ORAL_TABLET | ORAL | Status: DC | PRN

## 2018-10-09 MED ORDER — FLUTICASONE PROPIONATE 50 MCG/ACT NA SUSP
1.0000 | NASAL | Status: DC | PRN
  Filled 2018-10-09: qty 16

## 2018-10-09 MED ORDER — IOHEXOL 350 MG/ML SOLN
75.0000 mL | Freq: Once | INTRAVENOUS | Status: AC | PRN
  Administered 2018-10-09: 75 mL via INTRAVENOUS

## 2018-10-09 MED ORDER — ASPIRIN EC 325 MG PO TBEC
325.0000 mg | DELAYED_RELEASE_TABLET | Freq: Every day | ORAL | Status: DC
  Administered 2018-10-09 – 2018-10-10 (×2): 325 mg via ORAL
  Filled 2018-10-09 (×2): qty 1

## 2018-10-09 MED ORDER — NITROGLYCERIN 0.4 MG SL SUBL
0.4000 mg | SUBLINGUAL_TABLET | Freq: Once | SUBLINGUAL | Status: AC
  Administered 2018-10-09: 0.4 mg via SUBLINGUAL
  Filled 2018-10-09: qty 1

## 2018-10-09 MED ORDER — ONDANSETRON HCL 4 MG/2ML IJ SOLN: 4.0000 mg | Freq: Four times a day (QID) | INTRAMUSCULAR | Status: DC | PRN

## 2018-10-09 MED ORDER — CIPROFLOXACIN HCL 500 MG PO TABS
500.0000 mg | ORAL_TABLET | Freq: Two times a day (BID) | ORAL | Status: DC
  Administered 2018-10-09 – 2018-10-10 (×2): 500 mg via ORAL
  Filled 2018-10-09 (×3): qty 1

## 2018-10-09 MED ORDER — ENOXAPARIN SODIUM 40 MG/0.4ML ~~LOC~~ SOLN
40.0000 mg | SUBCUTANEOUS | Status: DC
  Administered 2018-10-09: 40 mg via SUBCUTANEOUS
  Filled 2018-10-09: qty 0.4

## 2018-10-09 MED ORDER — LEVOTHYROXINE SODIUM 88 MCG PO TABS
88.0000 ug | ORAL_TABLET | Freq: Every day | ORAL | Status: DC
  Administered 2018-10-10: 88 ug via ORAL
  Filled 2018-10-09: qty 1

## 2018-10-09 MED ORDER — ALUM & MAG HYDROXIDE-SIMETH 200-200-20 MG/5ML PO SUSP: 30.0000 mL | Freq: Once | ORAL | Status: DC

## 2018-10-09 MED ORDER — LIDOCAINE VISCOUS HCL 2 % MT SOLN: 15.0000 mL | Freq: Once | OROMUCOSAL | Status: DC

## 2018-10-09 NOTE — ED Triage Notes (Signed)
Pt states intermittent midsternal pressure the past few weeks, husband diagnosed with a brain tumor and the past few months have been stressful, tearful in triage.

## 2018-10-09 NOTE — H&P (Addendum)
PCP:   McLean-Scocuzza, Nino Glow, MD   Chief Complaint:  Chest pains  HPI: this is a 72 y/o female who presents with c/o chest pain for several weeks. She is under a lot of stress because her husband has metastatic brain tumor. She believes her stress finally got her plus she has this head infection going on.  Her chest pain is located on the left side. Her chest pain has been ongoing apporoximately one month and has been getting pretty intense lately. Her chest pain radiates occasionally to right breast. She has a hard time laying on her left breast over the last month. She denies GERD type symptoms. She denies SOB, LE edema, diaphoresis with the chest pains or nausea. She reports occasional heart palpitations and light headedness. Her chest pain has been continuous over the last month especially at night. It  Is not affected by movement. She help her ill husband with physical assistance when needed. She describes the pain as heavy. A its worse her pain is 6/10, currently 4/10.  She has also been fighting a sinus infection since Wednesday. She reports cough, stuffiness, ears are clogged. She denies feves or chills.  Her last stress test was in the remote past it was positive. She had a normal LHC.  History provided by the patient   Review of Systems:   anorexia, fever, weight loss, sinus congestion, vision loss, decreased hearing, hoarseness, chest pain, syncope, dyspnea on exertion, cough, peripheral edema, balance deficits, hemoptysis, abdominal pain, melena, hematochezia, severe indigestion/heartburn, hematuria, incontinence, genital sores, muscle weakness, suspicious skin lesions, transient blindness, difficulty walking, depression, unusual weight change, abnormal bleeding, enlarged lymph nodes, angioedema, and breast masses.  Past Medical History: Past Medical History:  Diagnosis Date  . Arthritis    neck, back, left knee;   . Breast cancer [facility]) 2009   right lumpectomy s/p radiation    . Chicken pox   . Chronic UTI    established with urology   . Diverticular disease    -osis and -itis   . GERD (gastroesophageal reflux disease)   . Hormone disorder   . Hyperlipidemia   . Hypertension    controlled well with medication;   . Personal history of radiation therapy 2009   F/U right breast cancer  . Thyroid disease    hypothyroidism    Past Surgical History:  Procedure Laterality Date  . BONE GRAFT HIP ILIAC CREST     + cage left hip 10/23/17   . BREAST BIOPSY Left 2009   clip,benign  . BREAST BIOPSY Right 2009   +  . BREAST LUMPECTOMY Right 2009   2009 lumpectomy   . INSERTION OF MESH N/A 05/29/2018   Procedure: INSERTION OF MESH;  Surgeon: Johnathan Hausen, MD;  Location: [facility];  Service: General;  Laterality: N/A;  . SHOULDER SURGERY     x2 surgeries both shoulders, right shoulder replacement last in 2012   . SPINE SURGERY     spinal 09/2017 h/o scoliosis   . TONSILLECTOMY     age 78 y.o.   . TOTAL SHOULDER REPLACEMENT    . VENTRAL HERNIA REPAIR N/A 05/29/2018   Procedure: LAPAROSCOPIC VENTRAL / [place];  Surgeon: Johnathan Hausen, MD;  Location: [facility];  Service: General;  Laterality: N/A;    Medications: Prior to Admission medications   Medication Sig Start Date End Date Taking? Authorizing Provider  amLODipine (NORVASC) 5 MG tablet Take 1 tablet (5 mg total) by mouth daily. 09/24/18  McLean-Scocuzza, Nino Glow, MD  atorvastatin (LIPITOR) 20 MG tablet Take 0.5 tablets (10 mg total) by mouth daily at 6 PM. 12/29/17   McLean-Scocuzza, Nino Glow, MD  Cholecalciferol (VITAMIN D3 PO) Take 1 capsule by mouth daily.    [provider]  ciprofloxacin (CIPRO) 500 MG tablet Take 1 tablet (500 mg total) by mouth 2 (two) times daily. With food 09/10/18   McLean-Scocuzza, Nino Glow, MD  fluticasone (FLONASE) 50 MCG/ACT nasal spray Place 1 spray into both nostrils as needed for allergies or rhinitis.    [provider]  levothyroxine (SYNTHROID,  LEVOTHROID) 88 MCG tablet Take 1 tablet (88 mcg total) by mouth daily before breakfast. 12/29/17   McLean-Scocuzza, Nino Glow, MD  Multiple Vitamin (MULTI-VITAMINS) TABS Take 1 tablet by mouth daily.     [provider]  zolpidem (AMBIEN) 5 MG tablet Take 0.5 tablets (2.5 mg total) by mouth at bedtime as needed for sleep. 09/08/18   McLean-Scocuzza, Nino Glow, MD    Allergies:   Allergies  Allergen Reactions  . Sulfa Antibiotics Anaphylaxis, Hives and Itching    Social History:  reports that she has never smoked. She has never used smokeless tobacco. She reports that she drinks about 1.0 standard drinks of alcohol per week. She reports that she does not use drugs.  Family History: Family History  Problem Relation Age of Onset  . Arthritis Mother   . Heart disease Mother   . Hyperlipidemia Mother   . Hypertension Mother   . Arthritis Father   . Diabetes Father   . Cancer Father        colon  . Arthritis Sister   . Hyperlipidemia Sister   . Hypertension Sister   . Cancer Brother        lung, smoker  . Mental retardation Brother   . Drug abuse Daughter   . Arthritis Sister   . Hyperlipidemia Sister   . Bladder Cancer Neg Hx   . Kidney cancer Neg Hx     Physical Exam: Vitals:   10/09/18 1610 10/09/18 1715 10/09/18 1745 10/09/18 1800  BP: (!) 169/87     Pulse: 100 (!) 110 78 79  Resp: 18 13 14 19   Temp: 98.3 F (36.8 C)     TempSrc: Oral     SpO2: 100% 95% 98% 97%  Weight:      Height:        General:  Alert and oriented times three, well developed and nourished, no acute distress Eyes: PERRLA, pink conjunctiva, no scleral icterus ENT: Moist oral mucosa, neck supple, no thyromegaly Lungs: clear to ascultation, no wheeze, no crackles, no use of accessory muscles Cardiovascular: regular rate and rhythm, no regurgitation, no gallops, no murmurs. No carotid bruits, no JVD Abdomen: soft, positive BS, non-tender, non-distended, no organomegaly, not an acute  abdomen GU: not examined Neuro: CN II - XII grossly intact, sensation intact Musculoskeletal: strength 5/5 all extremities, no clubbing, cyanosis or edema Skin: no rash, no subcutaneous crepitation, no decubitus Psych: appropriate patient Chest wall: Reproducible chest wall pain left-sided   Labs on Admission:  Recent Labs    10/09/18 1613  NA 140  K 3.4*  CL 102  CO2 29  GLUCOSE 111*  BUN 14  CREATININE 0.54  CALCIUM 9.5   No results for input(s): AST, ALT, ALKPHOS, BILITOT, PROT, ALBUMIN in the last 72 hours. No results for input(s): LIPASE, AMYLASE in the last 72 hours. Recent Labs    10/09/18 1613  WBC 5.7  HGB 13.6  HCT 43.2  MCV 85.5  PLT 208   Recent Labs    10/09/18 1613  TROPONINI <0.03   Invalid input(s): POCBNP No results for input(s): DDIMER in the last 72 hours. No results for input(s): HGBA1C in the last 72 hours. No results for input(s): CHOL, HDL, LDLCALC, TRIG, CHOLHDL, LDLDIRECT in the last 72 hours. No results for input(s): TSH, T4TOTAL, T3FREE, THYROIDAB in the last 72 hours.  Invalid input(s): FREET3 No results for input(s): VITAMINB12, FOLATE, FERRITIN, TIBC, IRON, RETICCTPCT in the last 72 hours.  Micro Results: No results found for this or any previous visit (from the past 240 hour(s)).   Radiological Exams on Admission: Dg Chest 2 View  Result Date: 10/09/2018 CLINICAL DATA:  One month history of chest pain. Sinus congestion since Wednesday. History of hypertension, nonsmoker. EXAM: CHEST - 2 VIEW COMPARISON:  None. FINDINGS: The lungs are adequately inflated. There is no focal infiltrate. There is no pleural effusion. The heart and pulmonary vascularity are normal. The mediastinum is normal in width. There is calcification in the wall of the aortic arch. There is a prosthetic right shoulder joint. The patient has undergone posterior fusion from T9 inferiorly. IMPRESSION: There is no acute cardiopulmonary abnormality. Thoracic aortic  atherosclerosis. Electronically Signed   By: David  Martinique M.D.   On: 10/09/2018 16:36   Ct Angio Chest Pe W And/or Wo Contrast  Result Date: 10/09/2018 CLINICAL DATA:  Chest pressure and discomfort for 3 weeks EXAM: CT ANGIOGRAPHY CHEST WITH CONTRAST TECHNIQUE: Multidetector CT imaging of the chest was performed using the standard protocol during bolus administration of intravenous contrast. Multiplanar CT image reconstructions and MIPs were obtained to evaluate the vascular anatomy. CONTRAST:  26mL OMNIPAQUE IOHEXOL 350 MG/ML SOLN COMPARISON:  CXR 10/09/2018 FINDINGS: Cardiovascular: Borderline cardiomegaly without pericardial effusion. Nonaneurysmal atherosclerotic aorta without dissection. Conventional branch pattern of the great vessels without stenosis. Satisfactory opacification of pulmonary arteries without acute pulmonary embolus. Mediastinum/Nodes: No enlarged mediastinal, hilar, or axillary lymph nodes. Thyroid gland, trachea, and esophagus demonstrate no significant findings. Lungs/Pleura: Minimal right apical pleuroparenchymal scarring. No dominant mass, pulmonary consolidation or pneumothorax. No effusion. Dependent atelectasis at the right base. Subsegmental atelectasis noted in the lingula and left lower lobe. Upper Abdomen: No acute abnormality. Musculoskeletal: Posterior spinal fusion hardware noted of the lower thoracic spine extending caudad into the lumbar spine. There is thoracolumbar spondylosis noted without acute osseous appearing abnormality. Right shoulder arthroplasty is partially included. Macroscopic calcifications noted within the the right breast possibly representing stigmata of degenerating fibroadenomas. Correlation with the patient's mammograms recommended for further disposition. Review of the MIP images confirms the above findings. IMPRESSION: 1. No acute pulmonary embolus, aortic aneurysm or dissection. 2. No active pulmonary disease. Aortic Atherosclerosis (ICD10-I70.0).  Electronically Signed   By: Ashley Royalty M.D.   On: 10/09/2018 18:01    Assessment/Plan Present on Admission: . Chest pain -Plan for overnight observation with telemetry -Chest pain orderset initiated, second cardiac enzymes, aspirin daily -IV Toradol x1 -2D echo in a.m., lilkely outpatient stress test given the fact that this weekend -Suspect costochondritis given the reproducibility of her chest pains but patient does have risk factors  Sinus infection -P.o. antibiotics and decongestions will be ordered  Mild hypokalemia -Replete p.o., BMP in a.m.  . HLD (hyperlipidemia) -Stable, home meds resumed  . HTN (hypertension) -Stable, home meds resumed  . Hypothyroidism -Stable, home meds resumed  . h/o Breast cancer ([place]) [place]  . Chronic UTI -Aware  CROSLEY, DEBBY 10/09/2018, 6:30 PM

## 2018-10-09 NOTE — ED Notes (Signed)
Unable to call report due to protected time. Will call at [facility]

## 2018-10-09 NOTE — ED Provider Notes (Signed)
[facility] Emergency Department Provider Note   ____________________________________________    I have reviewed the triage vital signs and the nursing notes.   HISTORY  Chief Complaint Chest Pain     HPI Maria Spencer is a 72 y.o. female who presents with complaints of intermittent chest pressure which is apparently become more constant over the last week.  Is not worse with exertion.  Her chest is tender to palpation.  She reports over the last 2 days she has developed a sinus infection with postnasal drainage as well.  Denies fevers or chills.  No shortness of breath.  No calf pain or swelling.  No nausea vomiting or diaphoresis.  Has been caring for her sick husband   Past Medical History:  Diagnosis Date  . Arthritis    neck, back, left knee;   . Breast cancer [facility]) 2009   right lumpectomy s/p radiation   . Chicken pox   . Chronic UTI    established with urology   . Diverticular disease    -osis and -itis   . GERD (gastroesophageal reflux disease)   . Hormone disorder   . Hyperlipidemia   . Hypertension    controlled well with medication;   . Personal history of radiation therapy 2009   F/U right breast cancer  . Thyroid disease    hypothyroidism     Patient Active Problem List   Diagnosis Date Noted  . Chest pain 10/09/2018  . 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 Scoliosis of thoracolumbar spine s/p T10-ilium fusion 10/20/2017 05/28/2018  . Prediabetes 03/26/2018  . Annual visit for general adult medical examination with abnormal findings 03/26/2018  . Iron deficiency anemia 03/26/2018  . Unilateral inguinal hernia without obstruction or gangrene 03/26/2018  . Abdominal hernia without obstruction and without gangrene 03/26/2018  . Breast cancer ([place]) 01/07/2018  . GERD (gastroesophageal reflux disease) 01/07/2018  . Insomnia 01/07/2018  . Migraine 01/07/2018  . Osteopenia  01/07/2018  . Arthritis 01/07/2018  . Chronic UTI 12/31/2017  . IBS (irritable bowel syndrome) 12/29/2017  . History of breast cancer 12/29/2017  . Hypothyroidism 12/29/2017  . Baker's cyst of knee, left 12/29/2017  . HTN (hypertension) 12/29/2017  . HLD (hyperlipidemia) 12/29/2017  . Nausea 12/29/2017  . Abdominal pain 12/29/2017  . Scoliosis of cervical region due to degenerative disease of spine in adult 09/18/2017  . Kidney stones 03/29/2015    Past Surgical History:  Procedure Laterality Date  . BONE GRAFT HIP ILIAC CREST     + cage left hip 10/23/17   . BREAST BIOPSY Left 2009   clip,benign  . BREAST BIOPSY Right 2009   +  . BREAST LUMPECTOMY Right 2009   2009 lumpectomy   . INSERTION OF MESH N/A 05/29/2018   Procedure: INSERTION OF MESH;  Surgeon: Johnathan Hausen, MD;  Location: [facility];  Service: General;  Laterality: N/A;  . SHOULDER SURGERY     x2 surgeries both shoulders, right shoulder replacement last in 2012   . SPINE SURGERY     spinal 09/2017 h/o scoliosis   . TONSILLECTOMY     age 89 y.o.   . TOTAL SHOULDER REPLACEMENT    . VENTRAL HERNIA REPAIR N/A 05/29/2018   Procedure: LAPAROSCOPIC VENTRAL / [place];  Surgeon: Johnathan Hausen, MD;  Location: [facility];  Service: General;  Laterality: N/A;    Prior to Admission medications   Medication Sig Start Date End Date Taking?  Authorizing Provider  amLODipine (NORVASC) 5 MG tablet Take 1 tablet (5 mg total) by mouth daily. 09/24/18   McLean-Scocuzza, Nino Glow, MD  atorvastatin (LIPITOR) 20 MG tablet Take 0.5 tablets (10 mg total) by mouth daily at 6 PM. 12/29/17   McLean-Scocuzza, Nino Glow, MD  Cholecalciferol (VITAMIN D3 PO) Take 1 capsule by mouth daily.    [provider]  ciprofloxacin (CIPRO) 500 MG tablet Take 1 tablet (500 mg total) by mouth 2 (two) times daily. With food 09/10/18   McLean-Scocuzza, Nino Glow, MD  fluticasone (FLONASE) 50 MCG/ACT nasal spray Place 1 spray into both nostrils as  needed for allergies or rhinitis.    [provider]  levothyroxine (SYNTHROID, LEVOTHROID) 88 MCG tablet Take 1 tablet (88 mcg total) by mouth daily before breakfast. 12/29/17   McLean-Scocuzza, Nino Glow, MD  Multiple Vitamin (MULTI-VITAMINS) TABS Take 1 tablet by mouth daily.     [provider]  zolpidem (AMBIEN) 5 MG tablet Take 0.5 tablets (2.5 mg total) by mouth at bedtime as needed for sleep. 09/08/18   McLean-Scocuzza, Nino Glow, MD     Allergies Sulfa antibiotics  Family History  Problem Relation Age of Onset  . Arthritis Mother   . Heart disease Mother   . Hyperlipidemia Mother   . Hypertension Mother   . Arthritis Father   . Diabetes Father   . Cancer Father        colon  . Arthritis Sister   . Hyperlipidemia Sister   . Hypertension Sister   . Cancer Brother        lung, smoker  . Mental retardation Brother   . Drug abuse Daughter   . Arthritis Sister   . Hyperlipidemia Sister   . Bladder Cancer Neg Hx   . Kidney cancer Neg Hx     Social History Social History   Tobacco Use  . Smoking status: Never Smoker  . Smokeless tobacco: Never Used  Substance Use Topics  . Alcohol use: Yes    Alcohol/week: 1.0 standard drinks    Types: 1 Glasses of wine per week    Comment: nightly  . Drug use: No    Review of Systems  Constitutional: No fever/chills Eyes: No visual changes.  ENT: Postnasal drip Cardiovascular: As above Respiratory: Denies shortness of breath.  No cough Gastrointestinal: No abdominal pain.  No nausea, no vomiting.   Genitourinary: Negative for dysuria. Musculoskeletal: Chronic back pain Skin: Negative for rash. Neurological: Negative for headaches    ____________________________________________   PHYSICAL EXAM:  VITAL SIGNS: ED Triage Vitals  Enc Vitals Group     BP 10/09/18 1610 (!) 169/87     Pulse Rate 10/09/18 1610 100     Resp 10/09/18 1608 18     Temp 10/09/18 1608 98.3 F (36.8 C)     Temp Source 10/09/18  1608 Oral     SpO2 10/09/18 1610 100 %     Weight 10/09/18 1608 57.2 kg (126 lb)     Height 10/09/18 1608 1.499 m (4\' 11" )     Head Circumference --      Peak Flow --      Pain Score 10/09/18 1611 6     Pain Loc --      Pain Edu? --      Excl. in [place]? --     Constitutional: Alert and oriented Eyes: Conjunctivae are normal.   Nose: No congestion/rhinnorhea. Mouth/Throat: Mucous membranes are moist.    Cardiovascular: Normal rate,  regular rhythm. Grossly normal heart sounds.  Good peripheral circulation.  There is palpation along the inferior bilateral sternum, no rash or erythema Respiratory: Normal respiratory effort.  No retractions. Lungs CTAB. Gastrointestinal: Soft and nontender. No distention.  No CVA tenderness.  Musculoskeletal: Back: No vertebral tenderness to palpation warm and well perfused Neurologic:  Normal speech and language. No gross focal neurologic deficits are appreciated.  Skin:  Skin is warm, dry and intact.  Psychiatric: Mood and affect are normal. Speech and behavior are normal.  ____________________________________________   LABS (all labs ordered are listed, but only abnormal results are displayed)  Labs Reviewed  BASIC METABOLIC PANEL - Abnormal; Notable for the following components:      Result Value   Potassium 3.4 (*)    Glucose, Bld 111 (*)    All other components within normal limits  CBC - Abnormal; Notable for the following components:   RDW 15.9 (*)    All other components within normal limits  TROPONIN I   ____________________________________________  EKG  ED ECG REPORT I, Lavonia Drafts, the attending physician, personally viewed and interpreted this ECG.  Date: 10/09/2018  Rhythm: normal sinus rhythm QRS Axis: normal Intervals: normal ST/T Wave abnormalities: Nonspecific ST changes Narrative Interpretation: No significant change from prior  ____________________________________________  RADIOLOGY  Chest x-ray no acute  distress CT angiography unremarkable ____________________________________________   PROCEDURES  Procedure(s) performed: No  Procedures   Critical Care performed: No ____________________________________________   INITIAL IMPRESSION / ASSESSMENT AND PLAN / ED COURSE  Pertinent labs & imaging results that were available during my care of the patient were reviewed by me and considered in my medical decision making (see chart for details).  Patient presents with chest pressure initially intermittent, now more constant.  She has had this for nearly a week.  She is tender to palpation of the central chest wall.  EKG is unchanged from prior  CT angiography is reassuring.  Patient continues to have intermittent chest "heaviness ".  Will admit to the hospital service for further evaluation    ____________________________________________   FINAL CLINICAL IMPRESSION(S) / ED DIAGNOSES  Final diagnoses:  Chest pain, unspecified type        Note:  This document was prepared using Dragon voice recognition software and may include unintentional dictation errors.    Lavonia Drafts, MD 10/09/18 [phone]

## 2018-10-09 NOTE — ED Notes (Addendum)
FIRST NURSE NOTE:  Received call from [place] PA at [facility], pt has had c/o of chest pressure and discomfort x [redacted] weeks along with sinus congestion, pt has hx of HTN and takes Lisinopril daily.  EKG at [facility] showed L ventricular hypertrophy  BP at [facility] - 171/92. Pt did not have any aspirin today.

## 2018-10-09 NOTE — ED Notes (Signed)
Patient transported to X-ray 

## 2018-10-10 ENCOUNTER — Observation Stay
Admit: 2018-10-10 | Discharge: 2018-10-10 | Disposition: A | Discharge status: Home / Self Care | Payer: Medicare Other | Attending: Cardiovascular Disease | Admitting: Cardiovascular Disease

## 2018-10-10 DIAGNOSIS — I340 Nonrheumatic mitral (valve) insufficiency: Secondary | ICD-10-CM

## 2018-10-10 DIAGNOSIS — E039 Hypothyroidism, unspecified: Secondary | ICD-10-CM | POA: Diagnosis not present

## 2018-10-10 DIAGNOSIS — E785 Hyperlipidemia, unspecified: Secondary | ICD-10-CM | POA: Diagnosis not present

## 2018-10-10 DIAGNOSIS — I10 Essential (primary) hypertension: Secondary | ICD-10-CM | POA: Diagnosis not present

## 2018-10-10 DIAGNOSIS — R9431 Abnormal electrocardiogram [ECG] [EKG]: Secondary | ICD-10-CM | POA: Diagnosis not present

## 2018-10-10 DIAGNOSIS — M940 Chondrocostal junction syndrome [Tietze]: Secondary | ICD-10-CM | POA: Diagnosis present

## 2018-10-10 DIAGNOSIS — R079 Chest pain, unspecified: Secondary | ICD-10-CM | POA: Diagnosis not present

## 2018-10-10 LAB — BASIC METABOLIC PANEL
Anion gap: 7 (ref 5–15)
BUN: 15 mg/dL (ref 8–23)
CO2: 27 mmol/L (ref 22–32)
Calcium: 8.7 mg/dL — ABNORMAL LOW (ref 8.9–10.3)
Chloride: 105 mmol/L (ref 98–111)
Creatinine, Ser: 0.66 mg/dL (ref 0.44–1.00)
GFR calc Af Amer: >60 mL/min (ref >60)
GFR calc non Af Amer: >60 mL/min (ref >60)
Glucose, Bld: 135 mg/dL — ABNORMAL HIGH (ref 70–99)
Potassium: 3.5 mmol/L (ref 3.5–5.1)
Sodium: 139 mmol/L (ref 135–145)

## 2018-10-10 LAB — CBC
HCT: 36.7 % (ref 36.0–46.0)
Hemoglobin: 11.6 g/dL — ABNORMAL LOW (ref 12.0–15.0)
MCH: 26.9 pg (ref 26.0–34.0)
MCHC: 31.6 g/dL (ref 30.0–36.0)
MCV: 85 fL (ref 80.0–100.0)
Platelets: 154 10*3/uL (ref 150–400)
RBC: 4.32 MIL/uL (ref 3.87–5.11)
RDW: 15.8 % — ABNORMAL HIGH (ref 11.5–15.5)
WBC: 4 10*3/uL (ref 4.0–10.5)
nRBC: 0 % (ref 0.0–0.2)

## 2018-10-10 LAB — ECHOCARDIOGRAM COMPLETE
Height: 57 in
Weight: 2016 oz

## 2018-10-10 LAB — MAGNESIUM: Magnesium: 2.1 mg/dL (ref 1.7–2.4)

## 2018-10-10 LAB — LIPID PANEL
Cholesterol: 156 mg/dL (ref 0–200)
HDL: 58 mg/dL (ref >40)
LDL Cholesterol: 74 mg/dL (ref 0–99)
Total CHOL/HDL Ratio: 2.7 RATIO
Triglycerides: 122 mg/dL (ref <150)
VLDL: 24 mg/dL (ref 0–40)

## 2018-10-10 LAB — TROPONIN I: Troponin I: <0.03 ng/mL (ref <0.03)

## 2018-10-10 MED ORDER — IBUPROFEN 200 MG PO TABS: 400.0000 mg | ORAL_TABLET | Freq: Three times a day (TID) | ORAL | Status: AC

## 2018-10-10 MED ORDER — ASPIRIN EC 81 MG PO TBEC: 81.0000 mg | DELAYED_RELEASE_TABLET | Freq: Every day | ORAL | Status: DC

## 2018-10-10 NOTE — Care Management Obs Status (Signed)
[place] NOTIFICATION   Patient Details  Name: Avaley Coop MRN: [id number] Date of Birth: 1946/04/14   Medicare Observation Status Notification Given:  Yes    Josh A Simser, RN 10/10/2018, 10:02 AM

## 2018-10-14 ENCOUNTER — Other Ambulatory Visit: Payer: Self-pay | Admitting: Internal Medicine

## 2018-10-14 ENCOUNTER — Ambulatory Visit: Payer: Self-pay | Admitting: *Deleted

## 2018-10-14 DIAGNOSIS — F329 Major depressive disorder, single episode, unspecified: Secondary | ICD-10-CM

## 2018-10-14 DIAGNOSIS — F32A Depression, unspecified: Secondary | ICD-10-CM

## 2018-10-14 DIAGNOSIS — F419 Anxiety disorder, unspecified: Secondary | ICD-10-CM

## 2018-10-14 MED ORDER — SERTRALINE HCL 50 MG PO TABS: 50.0000 mg | ORAL_TABLET | Freq: Every day | ORAL | 0 refills | Status: DC

## 2018-10-14 MED ORDER — LORAZEPAM 0.5 MG PO TABS: 0.2500 mg | ORAL_TABLET | Freq: Two times a day (BID) | ORAL | 2 refills | Status: DC | PRN

## 2018-10-14 NOTE — Telephone Encounter (Signed)
Sent zoloft to take daily in am and ativan 1/2 pill 1-2x as needed per day  Please inform pt   tMS

## 2018-10-14 NOTE — Telephone Encounter (Signed)
Maria Spencer is dealing with her husband's brain cancer and continued medical complications. She is requesting for an anti anxiety prescription to help her deal with the anxiousness of the situation. She feels her anxiety lever has increased since her most recent OV with the Dr. On 09/08/18 which she was offered an anti anxiety medication and declined at that time. Pharmacy-[facility]. Routing to PCP for consideration.   Reason for Disposition . Symptoms interfere with sleep  Answer Assessment - Initial Assessment Questions 1. CONCERN: "What happened that made you call today?"     Feeling stressed with her husband's cancer condition. 2. ANXIETY SYMPTOM SCREENING: "Can you describe how you have been feeling?"  (e.g., tense, restless, panicky, anxious, keyed up, trouble sleeping, trouble concentrating)     Feels stressed, anxious and trouble sleeping. 3. ONSET: "How long have you been feeling this way?"    A couple of weeks 4. RECURRENT: "Have you felt this way before?"  If yes: "What happened that time?" "What helped these feelings go away in the past?"      Anxiety has increased with her husband's medical condition of brain cancer. 5. RISK OF HARM - SUICIDAL IDEATION:  "Do you ever have thoughts of hurting or killing yourself?"  (e.g., yes, no, no but preoccupation with thoughts about death) NO   - INTENT:  "Do you have thoughts of hurting or killing yourself right NOW?" (e.g., yes, no, N/A) NO   - PLAN: "Do you have a specific plan for how you would do this?" (e.g., gun, knife, overdose, no plan, N/A)     NA 6. RISK OF HARM - HOMICIDAL IDEATION:  "Do you ever have thoughts of hurting or killing someone else?"  (e.g., yes, no, no but preoccupation with thoughts about death) NO   - INTENT:  "Do you have thoughts of hurting or killing someone right NOW?" (e.g., yes, no, N/A)  NO   - PLAN: "Do you have a specific plan for how you would do this?" (e.g., gun, knife, no plan, N/A)      NA 7.  FUNCTIONAL IMPAIRMENT: "How have things been going for you overall in your life? Have you had any more difficulties than usual doing your normal daily activities?"  (e.g., better, same, worse; self-care, school, work, interactions)    Continued complications with her husband's cancer 8. SUPPORT: "Who is with you now?" "Who do you live with?" "Do you have family or friends nearby who you can talk to?"      It is just herself and husband. 9. THERAPIST: "Do you have a counselor or therapist? Name?"     no 10. STRESSORS: "Has there been any new stress or recent changes in your life?"       Husband's cancer 11. CAFFEINE ABUSE: "Do you drink caffeinated beverages, and how much each day?" (e.g., coffee, tea, colas)       no 12. SUBSTANCE ABUSE: "Do you use any illegal drugs or alcohol?"       no 13. OTHER SYMPTOMS: "Do you have any other physical symptoms right now?" (e.g., chest pain, palpitations, difficulty breathing, fever)       none 14. PREGNANCY: "Is there any chance you are pregnant?" "When was your last menstrual period?"       no  Protocols used: ANXIETY AND PANIC ATTACK-A-AH

## 2018-10-14 NOTE — Telephone Encounter (Signed)
Please advise 

## 2018-10-24 NOTE — Discharge Summary (Signed)
[place] [facility] [place] NAME: Maria Spencer    MR#:  [id number]  DATE OF BIRTH:  1946-07-17  DATE OF ADMISSION:  10/09/2018 ADMITTING PHYSICIAN: Quintella Baton, MD  DATE OF DISCHARGE: 10/10/2018  1:42 PM  PRIMARY CARE PHYSICIAN: McLean-Scocuzza, Nino Glow, MD   ADMISSION DIAGNOSIS:  Chest pain, unspecified type [R07.9]  DISCHARGE DIAGNOSIS:  Principal Problem:   Chest pain Active Problems:   History of breast cancer   Hypothyroidism   Chronic UTI   HTN (hypertension)   HLD (hyperlipidemia)   Breast cancer (HCC)   Costochondritis   SECONDARY DIAGNOSIS:   Past Medical History:  Diagnosis Date  . Arthritis    neck, back, left knee;   . Breast cancer [facility]) 2009   right lumpectomy s/p radiation   . Chicken pox   . Chronic UTI    established with urology   . Diverticular disease    -osis and -itis   . Hormone disorder   . Hyperlipidemia   . Hypertension    controlled well with medication;   . Personal history of radiation therapy 2009   F/U right breast cancer  . Thyroid disease    hypothyroidism      ADMITTING HISTORY  HPI: this is a 72 y/o female who presents with c/o chest pain for several weeks. She is under a lot of stress because her husband has metastatic brain tumor. She believes her stress finally got her plus she has this head infection going on.  Her chest pain is located on the left side. Her chest pain has been ongoing apporoximately one month and has been getting pretty intense lately. Her chest pain radiates occasionally to right breast. She has a hard time laying on her left breast over the last month. She denies GERD type symptoms. She denies SOB, LE edema, diaphoresis with the chest pains or nausea. She reports occasional heart palpitations and light headedness. Her chest pain has been continuous over the last month especially at night. It  Is not affected by movement. She help her ill husband with physical  assistance when needed. She describes the pain as heavy. A its worse her pain is 6/10, currently 4/10.  She has also been fighting a sinus infection since Wednesday. She reports cough, stuffiness, ears are clogged. She denies feves or chills.  Her last stress test was in the remote past it was positive. She had a normal LHC.  History provided by the patient.  HOSPITAL COURSE:   *Chest pain, atypical.  Admitted to telemetry floor.  Troponin checked 3 times and remained normal.  No arrhythmias on cardiac monitoring.  Patient had CTA of the chest which showed no pulmonary embolism.  An echocardiogram was checked which showed normal ejection fraction with no acute abnormalities.  With atypical chest pain which has improved patient is being discharged home with NSAIDs.  Advised patient to call her physician or return to ED if any worsening symptoms.  CONSULTS OBTAINED:  Treatment Team:  Nicholes Mango, MD  DRUG ALLERGIES:   Allergies  Allergen Reactions  . Sulfa Antibiotics Anaphylaxis, Hives and Itching    DISCHARGE MEDICATIONS:   Allergies as of 10/10/2018      Reactions   Sulfa Antibiotics Anaphylaxis, Hives, Itching      Medication List    STOP taking these medications   ciprofloxacin 500 MG tablet Commonly known as:  CIPRO     TAKE these medications   amLODipine 5 MG tablet  Commonly known as:  NORVASC Take 1 tablet (5 mg total) by mouth daily.   aspirin EC 81 MG tablet Take 1 tablet (81 mg total) by mouth daily.   atorvastatin 20 MG tablet Commonly known as:  LIPITOR Take 0.5 tablets (10 mg total) by mouth daily at 6 PM.   fluticasone 50 MCG/ACT nasal spray Commonly known as:  FLONASE Place 1 spray into both nostrils as needed for allergies or rhinitis.   levothyroxine 88 MCG tablet Commonly known as:  SYNTHROID, LEVOTHROID Take 1 tablet (88 mcg total) by mouth daily before breakfast.   lisinopril 20 MG tablet Commonly known as:  PRINIVIL,ZESTRIL Take 20  mg by mouth daily.   MULTI-VITAMINS Tabs Take 1 tablet by mouth daily.   VITAMIN D3 PO Take 1 capsule by mouth daily.   zolpidem 5 MG tablet Commonly known as:  AMBIEN Take 0.5 tablets (2.5 mg total) by mouth at bedtime as needed for sleep.     ASK your doctor about these medications   ibuprofen 200 MG tablet Commonly known as:  ADVIL,MOTRIN Take 2 tablets (400 mg total) by mouth 3 (three) times daily for 3 days. Ask about: Should I take this medication?       Today   VITAL SIGNS:  Blood pressure 131/76, pulse 80, temperature 98 F (36.7 C), temperature source Oral, resp. rate 18, height 4\' 9"  (1.448 m), weight 57.2 kg, SpO2 97 %.  I/O:  No intake or output data in the 24 hours ending 10/24/18 1347  PHYSICAL EXAMINATION:  Physical Exam  GENERAL:  72 y.o.-year-old patient lying in the bed with no acute distress.  LUNGS: Normal breath sounds bilaterally, no wheezing, rales,rhonchi or crepitation. No use of accessory muscles of respiration.  CARDIOVASCULAR: S1, S2 normal. No murmurs, rubs, or gallops.  ABDOMEN: Soft, non-tender, non-distended. Bowel sounds present. No organomegaly or mass.  NEUROLOGIC: Moves all 4 extremities. PSYCHIATRIC: The patient is alert and oriented x 3.  SKIN: No obvious rash, lesion, or ulcer.   DATA REVIEW:   CBC No results for input(s): WBC, HGB, HCT, PLT in the last 168 hours.  Chemistries  No results for input(s): NA, K, CL, CO2, GLUCOSE, BUN, CREATININE, CALCIUM, MG, AST, ALT, ALKPHOS, BILITOT in the last 168 hours.  Invalid input(s): GFRCGP  Cardiac Enzymes No results for input(s): TROPONINI in the last 168 hours.  Microbiology Results  Results for orders placed or performed in visit on 09/22/18  Urine Culture     Status: None   Collection Time: 09/22/18  2:16 PM  Result Value Ref Range Status   MICRO NUMBER: [id number]  Final   SPECIMEN QUALITY: ADEQUATE  Final   Sample Source URINE  Final   STATUS: FINAL  Final   ISOLATE 1:    Final    Three or more organisms present, each greater than 10,000 cu/mL. May represent normal flora contamination from external genitalia. No further testing is required.    RADIOLOGY:  No results found.  Follow up with PCP in 1 week.  Management plans discussed with the patient, family and they are in agreement.  CODE STATUS:  Code Status History    Date Active Date Inactive Code Status Order ID Comments User Context   10/09/2018 2012 10/10/2018 1653 Full Code [id number]  Quintella Baton, MD Inpatient   05/28/2018 1807 05/30/2018 1717 Full Code [id number]  Michael Boston, MD ED    Advance Directive Documentation     Most Recent Value  Type of Advance Directive  Living will, Healthcare Power of Attorney  Pre-existing out of facility DNR order (yellow form or pink MOST form)  -  "MOST" Form in Place?  -      TOTAL TIME TAKING CARE OF THIS PATIENT ON DAY OF DISCHARGE: more than 30 minutes.   Leia Alf Sudini M.D on 10/24/2018 at 1:47 PM  Between 7am to 6pm - Pager - [phone]  After 6pm go to [email or web address] - password [email or web address] [facility]  Office  [phone]  CC: Primary care physician; McLean-Scocuzza, Nino Glow, MD  Note: This dictation was prepared with Dragon dictation along with smaller phrase technology. Any transcriptional errors that result from this process are unintentional.

## 2018-11-10 ENCOUNTER — Other Ambulatory Visit: Payer: Self-pay | Admitting: Internal Medicine

## 2018-11-10 DIAGNOSIS — G4700 Insomnia, unspecified: Secondary | ICD-10-CM

## 2018-11-10 MED ORDER — ZOLPIDEM TARTRATE 5 MG PO TABS: 2.5000 mg | ORAL_TABLET | Freq: Every evening | ORAL | 2 refills | Status: DC | PRN

## 2018-12-15 DIAGNOSIS — M419 Scoliosis, unspecified: Secondary | ICD-10-CM | POA: Diagnosis not present

## 2018-12-15 DIAGNOSIS — Z6825 Body mass index (BMI) 25.0-25.9, adult: Secondary | ICD-10-CM | POA: Diagnosis not present

## 2018-12-15 DIAGNOSIS — M4152 Other secondary scoliosis, cervical region: Secondary | ICD-10-CM | POA: Diagnosis not present

## 2018-12-15 DIAGNOSIS — Z981 Arthrodesis status: Secondary | ICD-10-CM | POA: Diagnosis not present

## 2018-12-15 DIAGNOSIS — Z882 Allergy status to sulfonamides status: Secondary | ICD-10-CM | POA: Diagnosis not present

## 2019-01-10 ENCOUNTER — Other Ambulatory Visit: Payer: Self-pay | Admitting: Internal Medicine

## 2019-01-10 DIAGNOSIS — F419 Anxiety disorder, unspecified: Secondary | ICD-10-CM

## 2019-01-10 DIAGNOSIS — F329 Major depressive disorder, single episode, unspecified: Secondary | ICD-10-CM

## 2019-01-10 DIAGNOSIS — F32A Depression, unspecified: Secondary | ICD-10-CM

## 2019-01-10 MED ORDER — SERTRALINE HCL 50 MG PO TABS: 50.0000 mg | ORAL_TABLET | Freq: Every day | ORAL | 3 refills | Status: DC

## 2019-01-12 ENCOUNTER — Ambulatory Visit: Payer: Medicare Other | Admitting: Internal Medicine

## 2019-01-20 ENCOUNTER — Ambulatory Visit: Payer: Medicare Other | Admitting: Internal Medicine

## 2019-01-26 ENCOUNTER — Telehealth: Payer: Self-pay | Admitting: Internal Medicine

## 2019-01-26 ENCOUNTER — Other Ambulatory Visit: Payer: Self-pay | Admitting: Internal Medicine

## 2019-01-26 DIAGNOSIS — G4700 Insomnia, unspecified: Secondary | ICD-10-CM

## 2019-01-26 MED ORDER — ZOLPIDEM TARTRATE 5 MG PO TABS: 5.0000 mg | ORAL_TABLET | Freq: Every evening | ORAL | 0 refills | Status: DC | PRN

## 2019-01-26 NOTE — Telephone Encounter (Unsigned)
Copied from [place] [id number]. Topic: Quick Communication - Rx Refill/Question >> Jan 26, 2019 11:14 AM Maria Spencer wrote: Medication: zolpidem (AMBIEN) 5 MG tablet - Pt takes a whole tablet instead of half due to situation with her husband and not being able to sleep   Has the patient contacted their pharmacy? Pt switched to [facility]  Preferred Pharmacy (with phone number or street name): [facility], [place] [phone] (Phone) [phone] (Fax)    Agent: Please be advised that RX refills may take up to 3 business days. We ask that you follow-up with your pharmacy.

## 2019-01-27 ENCOUNTER — Ambulatory Visit (INDEPENDENT_AMBULATORY_CARE_PROVIDER_SITE_OTHER): Payer: Medicare Other

## 2019-01-27 ENCOUNTER — Ambulatory Visit: Payer: Medicare Other | Admitting: Internal Medicine

## 2019-01-27 ENCOUNTER — Encounter: Payer: Self-pay | Admitting: Internal Medicine

## 2019-01-27 ENCOUNTER — Other Ambulatory Visit: Payer: Self-pay | Admitting: Internal Medicine

## 2019-01-27 ENCOUNTER — Ambulatory Visit (INDEPENDENT_AMBULATORY_CARE_PROVIDER_SITE_OTHER): Payer: Medicare Other | Admitting: Internal Medicine

## 2019-01-27 VITALS — BP 115/80 | HR 88 | Temp 98.1°F | Ht 59.0 in | Wt 122.4 lb

## 2019-01-27 DIAGNOSIS — R0781 Pleurodynia: Secondary | ICD-10-CM

## 2019-01-27 DIAGNOSIS — N3000 Acute cystitis without hematuria: Secondary | ICD-10-CM

## 2019-01-27 DIAGNOSIS — S299XXA Unspecified injury of thorax, initial encounter: Secondary | ICD-10-CM | POA: Diagnosis not present

## 2019-01-27 IMAGING — DX DG RIBS 2V*L*
2 series · 2 of 2 positions shown · non-contrast
Comparison: CT [DATE]

CLINICAL DATA: Left rib pain.  Injury 1 week ago.

EXAM:
LEFT RIBS - 2 VIEW

[chest pa]
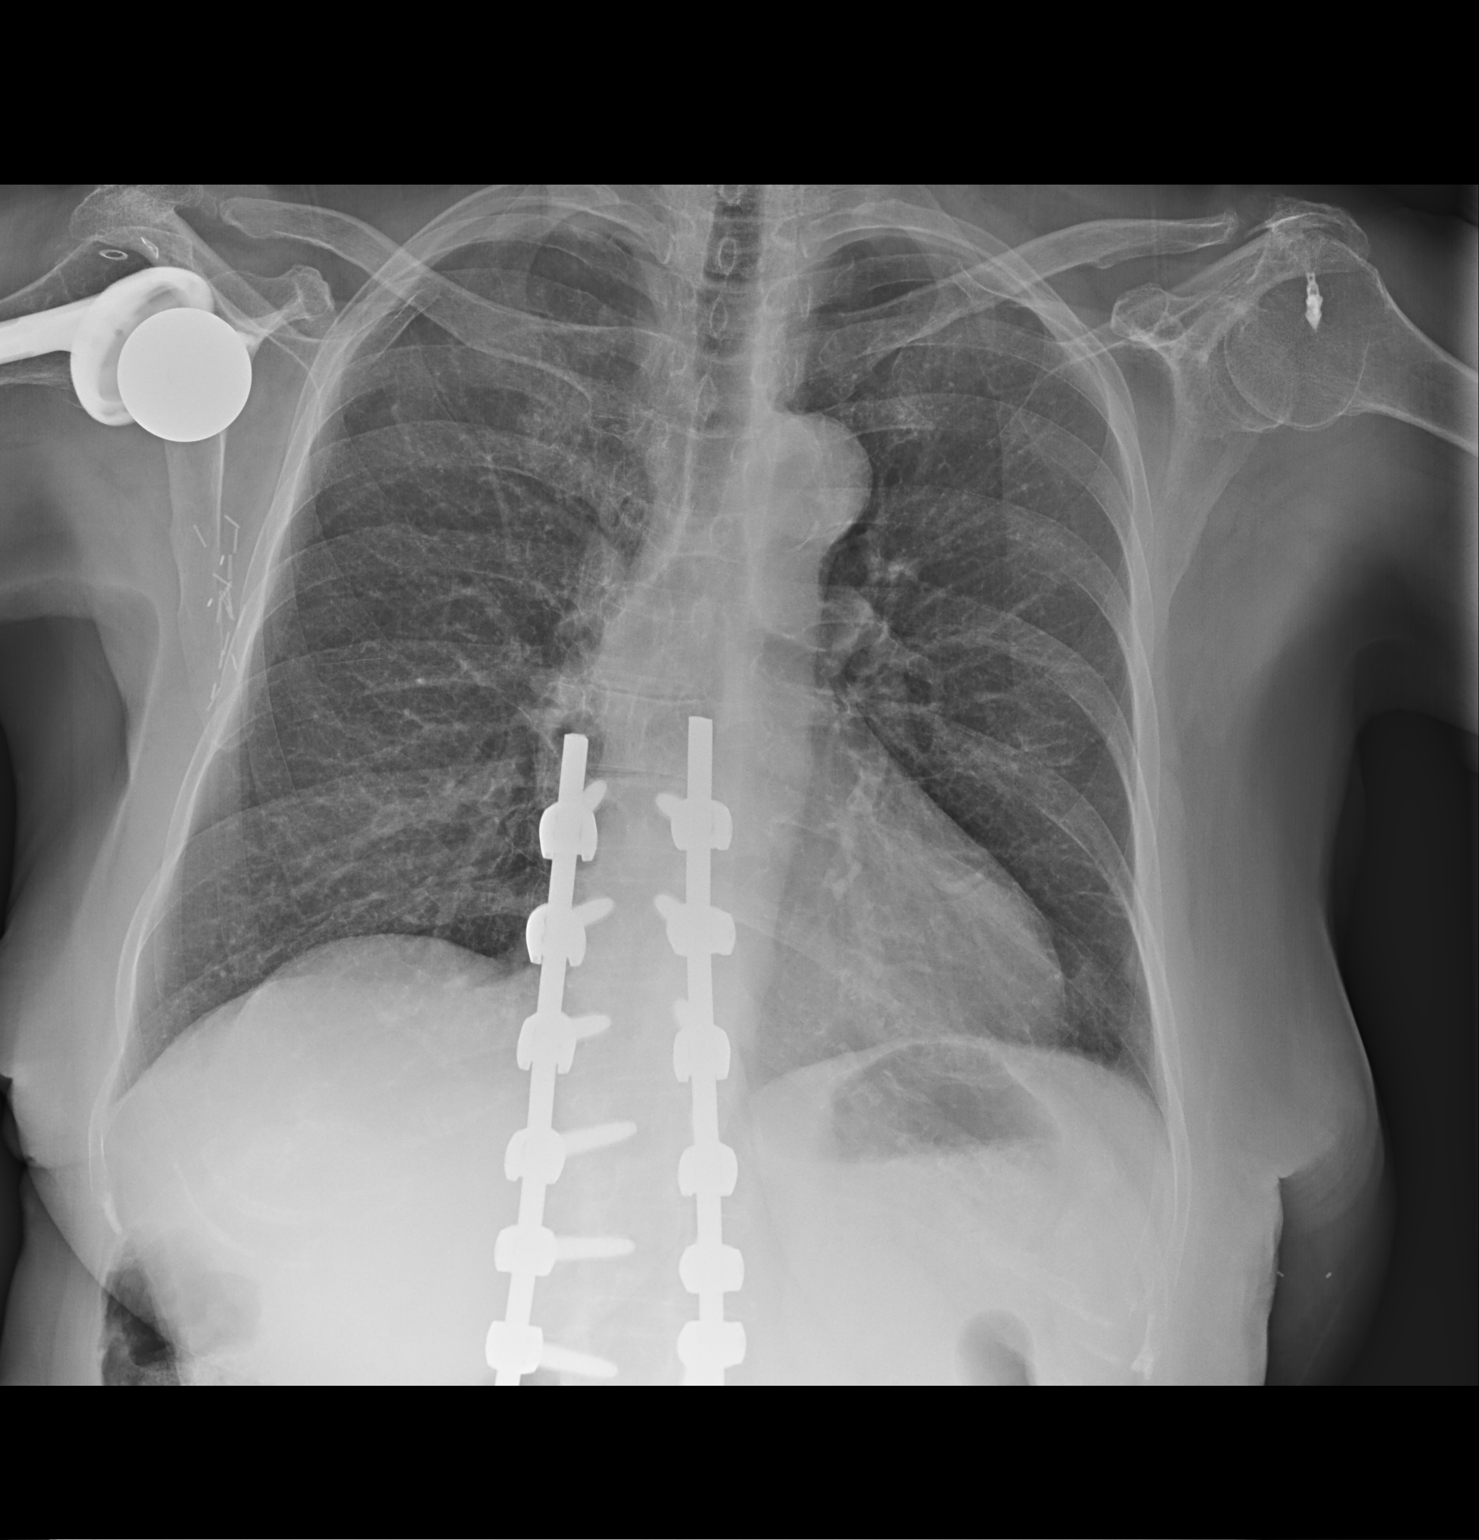

[oblique ribs obl (oblique)]
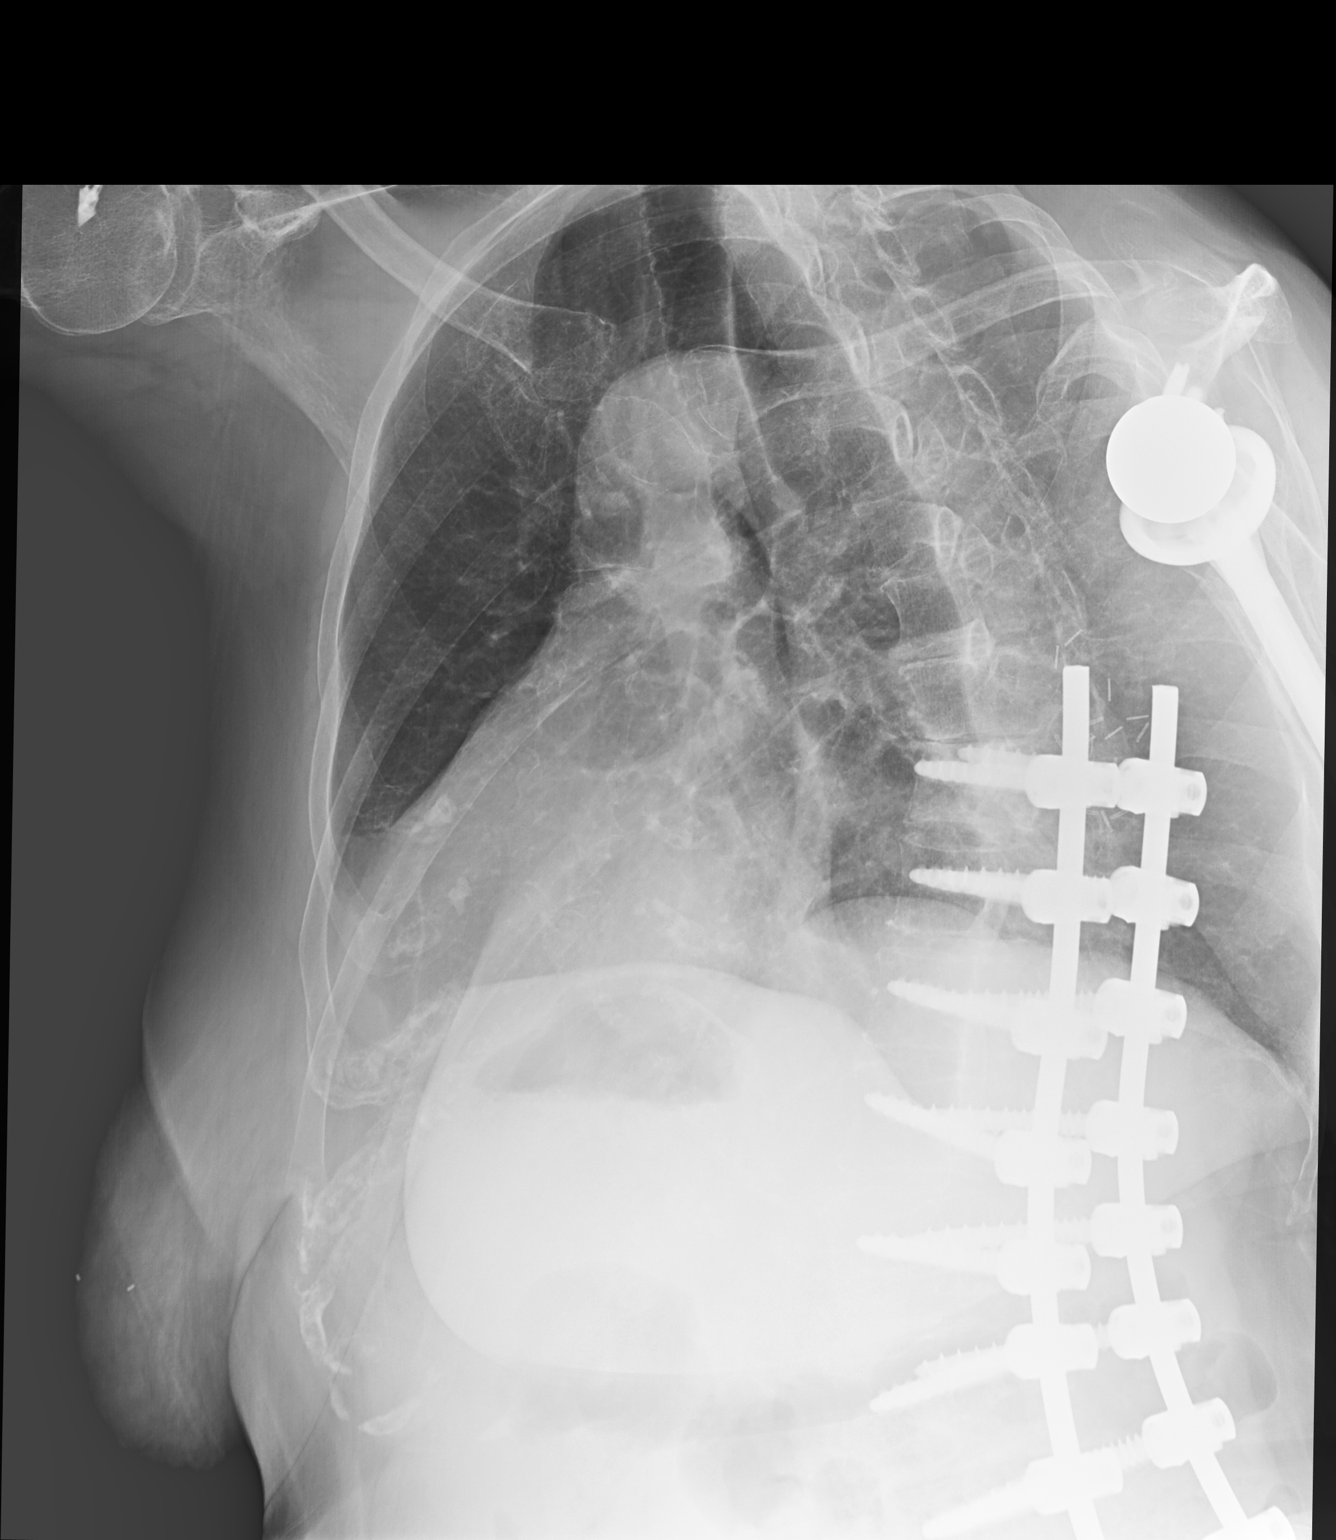

[2 of 2 positions shown; findings below may reference images not displayed]

FINDINGS: Heart is normal size. No confluent airspace opacities, effusions or
pneumothorax. No acute bony abnormality. No visible left rib
fracture. Postoperative changes from right shoulder replacement and
posterior spinal fusion.
IMPRESSION: No active cardiopulmonary disease. No visible rib fracture or
pneumothorax.

## 2019-01-27 MED ORDER — CIPROFLOXACIN HCL 500 MG PO TABS: 500.0000 mg | ORAL_TABLET | Freq: Two times a day (BID) | ORAL | 0 refills | Status: DC

## 2019-01-27 NOTE — Addendum Note (Signed)
Addended by: Leeanne Rio on: 01/27/2019 04:53 PM   Modules accepted: Orders

## 2019-01-27 NOTE — Progress Notes (Addendum)
Chief Complaint  Patient presents with  . Acute Visit    pain with urination   Acute visit with husband  1.c/o suprapubic pain x 2-4 days and burning with urination tried AZO stopped macrobid at night qhs weeks ago b/c was not helping. H/o recurrent uti she does wear a pad  2. Left rib pain x 1-2 weeks after lifting heavy hospital bed and equipment. It hurts to move and take a deep breath nothing tried.    Review of Systems  Constitutional: Negative for weight loss.  HENT: Negative for hearing loss.   Eyes: Negative for blurred vision.  Respiratory: Negative for shortness of breath.   Cardiovascular: Negative for chest pain.  Gastrointestinal: Negative for abdominal pain.  Genitourinary: Positive for dysuria.       +hesistancy    Musculoskeletal:       Left rib pain   Skin: Negative for rash.   Past Medical History:  Diagnosis Date  . Arthritis    neck, back, left knee;   . Breast cancer [facility]) 2009   right lumpectomy s/p radiation   . Chicken pox   . Chronic UTI    established with urology   . Diverticular disease    -osis and -itis   . Hormone disorder   . Hyperlipidemia   . Hypertension    controlled well with medication;   . Personal history of radiation therapy 2009   F/U right breast cancer  . Thyroid disease    hypothyroidism    Past Surgical History:  Procedure Laterality Date  . BONE GRAFT HIP ILIAC CREST     + cage left hip 10/23/17   . BREAST BIOPSY Left 2009   clip,benign  . BREAST BIOPSY Right 2009   +  . BREAST LUMPECTOMY Right 2009   2009 lumpectomy   . INSERTION OF MESH N/A 05/29/2018   Procedure: INSERTION OF MESH;  Surgeon: Johnathan Hausen, MD;  Location: [facility];  Service: General;  Laterality: N/A;  . SHOULDER SURGERY     x2 surgeries both shoulders, right shoulder replacement last in 2012   . SPINE SURGERY     spinal 09/2017 h/o scoliosis   . TONSILLECTOMY     age 83 y.o.   . TOTAL SHOULDER REPLACEMENT    . VENTRAL HERNIA REPAIR N/A  05/29/2018   Procedure: LAPAROSCOPIC VENTRAL / [place];  Surgeon: Johnathan Hausen, MD;  Location: [facility];  Service: General;  Laterality: N/A;   Family History  Problem Relation Age of Onset  . Arthritis Mother   . Heart disease Mother   . Hyperlipidemia Mother   . Hypertension Mother   . Arthritis Father   . Diabetes Father   . Cancer Father        colon  . Arthritis Sister   . Hyperlipidemia Sister   . Hypertension Sister   . Cancer Brother        lung, smoker  . Mental retardation Brother   . Drug abuse Daughter   . Arthritis Sister   . Hyperlipidemia Sister   . Bladder Cancer Neg Hx   . Kidney cancer Neg Hx    Social History   Socioeconomic History  . Marital status: Married    Spouse name: Not on file  . Number of children: Not on file  . Years of education: Not on file  . Highest education level: Not on file  Occupational History  . Not on file  Social Needs  . 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 Food insecurity:    Worry: Never true    Inability: Never true  . Transportation needs:    Medical: No    Non-medical: No  Tobacco Use  . Smoking status: Never Smoker  . Smokeless tobacco: Never Used  Substance and Sexual Activity  . Alcohol use: Not Currently    Alcohol/week: 1.0 standard drinks    Types: 1 Glasses of wine per week    Comment: nightly  . Drug use: No  . Sexual activity: Not on file  Lifestyle  . Physical activity:    Days per week: 5 days    Minutes per session: 30 min  . Stress: Rather much  Relationships  . Social connections:    Talks on phone: Not on file    Gets together: Not on file    Attends religious service: Not on file    Active member of club or organization: Not on file    Attends meetings of clubs or organizations: Not on file    Relationship status: Married  . Intimate partner violence:    Fear of current or ex partner: No    Emotionally abused: No    Physically abused: No    Forced sexual  activity: No  Other Topics Concern  . Not on file  Social History Narrative   College grad   Married    Wears seatbelt and safe in relationship    3 kids       No outpatient medications have been marked as taking for the 01/27/19 encounter (Office Visit) with McLean-Scocuzza, Nino Glow, MD.   Allergies  Allergen Reactions  . Sulfa Antibiotics Anaphylaxis, Hives and Itching   No results found for this or any previous visit (from the past 2160 hour(s)). Objective  Body mass index is 24.72 kg/m. Wt Readings from Last 3 Encounters:  01/27/19 122 lb 6.4 oz (55.5 kg)  10/09/18 126 lb (57.2 kg)  09/08/18 127 lb (57.6 kg)   Temp Readings from Last 3 Encounters:  01/27/19 98.1 F (36.7 C) (Oral)  10/10/18 98 F (36.7 C) (Oral)  09/08/18 98.3 F (36.8 C)   BP Readings from Last 3 Encounters:  01/27/19 115/80  10/10/18 131/76  09/08/18 130/72   Pulse Readings from Last 3 Encounters:  01/27/19 88  10/10/18 80  09/08/18 83    Physical Exam Vitals signs and nursing note reviewed.  Constitutional:      Appearance: Normal appearance. She is well-developed and well-groomed.  HENT:     Head: Normocephalic and atraumatic.     Nose: Nose normal.     Mouth/Throat:     Mouth: Mucous membranes are moist.     Pharynx: Oropharynx is clear.  Eyes:     Conjunctiva/sclera: Conjunctivae normal.     Pupils: Pupils are equal, round, and reactive to light.  Cardiovascular:     Rate and Rhythm: Normal rate and regular rhythm.     Heart sounds: Murmur present.  Abdominal:     Tenderness: There is abdominal tenderness in the suprapubic area. There is no right CVA tenderness or left CVA tenderness.     Hernia: A hernia is present.  Skin:    General: Skin is warm and dry.  Neurological:     General: No focal deficit present.     Mental Status: She is alert and oriented to person, place, and time. Mental status is at baseline.     Gait: Gait normal.  Psychiatric:  Attention and  Perception: Attention and perception normal.        Mood and Affect: Mood and affect normal.        Speech: Speech normal.        Behavior: Behavior normal. Behavior is cooperative.        Thought Content: Thought content normal.        Cognition and Memory: Cognition and memory normal.        Judgment: Judgment normal.     Assessment   1. Suspected UTI 2. Left rib pain pleuritic with deep breath likely MSK r/o rib fracture 3. HM Plan  1.  UA and culture today  Cipro 500 mg bid  Disc replens  2.  Xray left rib Prn Tylenol until Xray resulted  3. Had flu shot 08/07/18  prevnar had  pna 23 utd  Tdap utd  Declines shingrix, hep B vaccine for now  zostervax had 2012  Never smoker    s/p right breast lumpectomy 2009 and radiation only  -mammogram 04/29/18 negative ordered for 04/2019  Pap out of age window no h/o abnormal pap  DEXA h/o osteopenia wait on repeat in future offer disc'ed again today  Colonoscopy copy obtained reviewed 01/10/09 grade 1 IH and diverticulosis  -colonoscopy due at f/u  rec healthy diet and exercise  Provider: Dr. Olivia Mackie McLean-Scocuzza-Internal Medicine

## 2019-01-27 NOTE — Patient Instructions (Signed)
Try Replens otc for vaginal dryness   Urinary Tract Infection, Adult  A urinary tract infection (UTI) is an infection of any part of the urinary tract. The urinary tract includes the kidneys, ureters, bladder, and urethra. These organs make, store, and get rid of urine in the body. Your health care provider may use other names to describe the infection. An upper UTI affects the ureters and kidneys (pyelonephritis). A lower UTI affects the bladder (cystitis) and urethra (urethritis). What are the causes? Most urinary tract infections are caused by bacteria in your genital area, around the entrance to your urinary tract (urethra). These bacteria grow and cause inflammation of your urinary tract. What increases the risk? You are more likely to develop this condition if:  You have a urinary catheter that stays in place (indwelling).  You are not able to control when you urinate or have a bowel movement (you have incontinence).  You are female and you: ? Use a spermicide or diaphragm for birth control. ? Have low estrogen levels. ? Are pregnant.  You have certain genes that increase your risk (genetics).  You are sexually active.  You take antibiotic medicines.  You have a condition that causes your flow of urine to slow down, such as: ? An enlarged prostate, if you are female. ? Blockage in your urethra (stricture). ? A kidney stone. ? A nerve condition that affects your bladder control (neurogenic bladder). ? Not getting enough to drink, or not urinating often.  You have certain medical conditions, such as: ? Diabetes. ? A weak disease-fighting system (immunesystem). ? Sickle cell disease. ? Gout. ? Spinal cord injury. What are the signs or symptoms? Symptoms of this condition include:  Needing to urinate right away (urgently).  Frequent urination or passing small amounts of urine frequently.  Pain or burning with urination.  Blood in the urine.  Urine that smells bad or  unusual.  Trouble urinating.  Cloudy urine.  Vaginal discharge, if you are female.  Pain in the abdomen or the lower back. You may also have:  Vomiting or a decreased appetite.  Confusion.  Irritability or tiredness.  A fever.  Diarrhea. The first symptom in older adults may be confusion. In some cases, they may not have any symptoms until the infection has worsened. How is this diagnosed? This condition is diagnosed based on your medical history and a physical exam. You may also have other tests, including:  Urine tests.  Blood tests.  Tests for sexually transmitted infections (STIs). If you have had more than one UTI, a cystoscopy or imaging studies may be done to determine the cause of the infections. How is this treated? Treatment for this condition includes:  Antibiotic medicine.  Over-the-counter medicines to treat discomfort.  Drinking enough water to stay hydrated. If you have frequent infections or have other conditions such as a kidney stone, you may need to see a health care provider who specializes in the urinary tract (urologist). In rare cases, urinary tract infections can cause sepsis. Sepsis is a life-threatening condition that occurs when the body responds to an infection. Sepsis is treated in the hospital with IV antibiotics, fluids, and other medicines. Follow these instructions at home:  Medicines  Take over-the-counter and prescription medicines only as told by your health care provider.  If you were prescribed an antibiotic medicine, take it as told by your health care provider. Do not stop using the antibiotic even if you start to feel better. General instructions  Make  sure you: ? Empty your bladder often and completely. Do not hold urine for long periods of time. ? Empty your bladder after sex. ? Wipe from front to back after a bowel movement if you are female. Use each tissue one time when you wipe.  Drink enough fluid to keep your urine  pale yellow.  Keep all follow-up visits as told by your health care provider. This is important. Contact a health care provider if:  Your symptoms do not get better after 1-2 days.  Your symptoms go away and then return. Get help right away if you have:  Severe pain in your back or your lower abdomen.  A fever.  Nausea or vomiting. Summary  A urinary tract infection (UTI) is an infection of any part of the urinary tract, which includes the kidneys, ureters, bladder, and urethra.  Most urinary tract infections are caused by bacteria in your genital area, around the entrance to your urinary tract (urethra).  Treatment for this condition often includes antibiotic medicines.  If you were prescribed an antibiotic medicine, take it as told by your health care provider. Do not stop using the antibiotic even if you start to feel better.  Keep all follow-up visits as told by your health care provider. This is important. This information is not intended to replace advice given to you by your health care provider. Make sure you discuss any questions you have with your health care provider. Document Released: 08/21/2005 Document Revised: 05/21/2018 Document Reviewed: 05/21/2018 Elsevier Interactive Patient Education  2019 [employer].

## 2019-01-28 LAB — URINALYSIS, ROUTINE W REFLEX MICROSCOPIC
Bilirubin, UA: NEGATIVE
Glucose, UA: NEGATIVE
Ketones, UA: NEGATIVE
Nitrite, UA: NEGATIVE
Protein, UA: NEGATIVE
RBC, UA: NEGATIVE
Specific Gravity, UA: 1.028 (ref 1.005–1.030)
Urobilinogen, Ur: 0.2 mg/dL (ref 0.2–1.0)
pH, UA: 5 (ref 5.0–7.5)

## 2019-01-28 LAB — MICROSCOPIC EXAMINATION
Casts: NONE SEEN /lpf
WBC, UA: >30 /hpf — AB (ref 0–5)

## 2019-01-30 LAB — URINE CULTURE

## 2019-02-12 ENCOUNTER — Other Ambulatory Visit: Payer: Self-pay | Admitting: Internal Medicine

## 2019-02-12 DIAGNOSIS — I10 Essential (primary) hypertension: Secondary | ICD-10-CM

## 2019-02-12 DIAGNOSIS — E039 Hypothyroidism, unspecified: Secondary | ICD-10-CM

## 2019-02-12 MED ORDER — LEVOTHYROXINE SODIUM 88 MCG PO TABS: 88.0000 ug | ORAL_TABLET | Freq: Every day | ORAL | 3 refills | Status: DC

## 2019-02-12 MED ORDER — ATORVASTATIN CALCIUM 20 MG PO TABS: 10.0000 mg | ORAL_TABLET | Freq: Every day | ORAL | 0 refills | Status: DC

## 2019-02-12 MED ORDER — AMLODIPINE BESYLATE 5 MG PO TABS: 5.0000 mg | ORAL_TABLET | Freq: Every day | ORAL | 1 refills | Status: DC

## 2019-02-12 NOTE — Telephone Encounter (Signed)
Copied from [place] [id number]. Topic: Quick Communication - Rx Refill/Question >> Feb 12, 2019  9:33 AM Leward Quan A wrote: Medication: amLODipine (NORVASC) 5 MG tablet, levothyroxine (SYNTHROID, LEVOTHROID) 88 MCG tablet, atorvastatin (LIPITOR) 20 MG tablet  Has the patient contacted their pharmacy? Yes.   (Agent: If no, request that the patient contact the pharmacy for the refill.) (Agent: If yes, when and what did the pharmacy advise?)  Preferred Pharmacy (with phone number or street name): [place], [place] [phone] (Phone) [phone] (Fax)   Agent: Please be advised that RX refills may take up to 3 business days. We ask that you follow-up with your pharmacy.

## 2019-02-16 ENCOUNTER — Ambulatory Visit: Payer: Medicare Other | Admitting: Internal Medicine

## 2019-03-24 ENCOUNTER — Ambulatory Visit: Payer: Medicare Other | Admitting: Internal Medicine

## 2019-05-04 ENCOUNTER — Ambulatory Visit: Payer: Medicare Other

## 2019-05-14 ENCOUNTER — Other Ambulatory Visit: Payer: Self-pay | Admitting: Internal Medicine

## 2019-05-14 ENCOUNTER — Telehealth: Payer: Self-pay | Admitting: Internal Medicine

## 2019-05-14 ENCOUNTER — Ambulatory Visit: Payer: Self-pay | Admitting: Internal Medicine

## 2019-05-14 ENCOUNTER — Telehealth (INDEPENDENT_AMBULATORY_CARE_PROVIDER_SITE_OTHER): Payer: Medicare Other | Admitting: Internal Medicine

## 2019-05-14 DIAGNOSIS — H109 Unspecified conjunctivitis: Secondary | ICD-10-CM

## 2019-05-14 DIAGNOSIS — H1033 Unspecified acute conjunctivitis, bilateral: Secondary | ICD-10-CM

## 2019-05-14 DIAGNOSIS — J329 Chronic sinusitis, unspecified: Secondary | ICD-10-CM

## 2019-05-14 DIAGNOSIS — J321 Chronic frontal sinusitis: Secondary | ICD-10-CM | POA: Diagnosis not present

## 2019-05-14 MED ORDER — DOXYCYCLINE HYCLATE 100 MG PO TABS: 100.0000 mg | ORAL_TABLET | Freq: Two times a day (BID) | ORAL | 0 refills | Status: DC

## 2019-05-14 NOTE — Telephone Encounter (Signed)
Pt called wanting to set up a Doxy appt for Pink eye There are no appt available today here or at [facility] pt stated that she cant go to Urgent Care because her husband is on Hospice. Please advise pt if we are available to work her in

## 2019-05-14 NOTE — Telephone Encounter (Signed)
Pt no longer needs eye drop. Pt was able to get rx call into her pharm by eye doctor her brother

## 2019-05-14 NOTE — Telephone Encounter (Signed)
Husband was in [facility] in [place] was there and now home due to [place] care at home now lungs filling with fluid, more brain tumors and legs swollen   Patient had L red eye and c/w COVID 19, right eye is swollen underneath and both eyes slightly swollen, sensitive to light. Eye is crusty and feels like sand left, right eye underneath is swollen and feels like leather, appears she has big red bags under eyes  Brother in law eye MD in [place] he called tobramyacin/dexamethasone qid b/l x 9 days  No new cosmetic products  She changes husband due to him being incontinence ? 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 Conjunctivitis L>R  Tobramycin/DMS drops  Add Doxycycline 100 mg bid x 7-10 days with food  No COVID 19 sx's at this time   [place] Time spent 10 minutes telephone

## 2019-05-14 NOTE — Telephone Encounter (Signed)
If agreeable I can send medication for eye and charge a mychart/telephone fee to her insurance since no appts today  Would she like this?   [place]

## 2019-05-14 NOTE — Telephone Encounter (Signed)
Left message for patient to return call back. P[facility] may give information.  

## 2019-05-14 NOTE — Telephone Encounter (Signed)
  Pt. Reports she had had eye symptoms, redness, drainage x 1 week. Concerned about COVID 19. Husband is on hospice and does not want to endanger him. Warm transfer to [place] in the practice for virtual visit. Answer Assessment - Initial Assessment Questions 1. COVID-19 DIAGNOSIS: "Who made your Coronavirus (COVID-19) diagnosis?" "Was it confirmed by a positive lab test?" If not diagnosed by a HCP, ask "Are there lots of cases (community spread) where you live?" (See public health department website, if unsure)     No test 2. ONSET: "When did the COVID-19 symptoms start?"      Started 1 week ago 3. WORST SYMPTOM: "What is your worst symptom?" (e.g., cough, fever, shortness of breath, muscle aches)     Eyes drainage, redness 4. COUGH: "Do you have a cough?" If so, ask: "How bad is the cough?"       No 5. FEVER: "Do you have a fever?" If so, ask: "What is your temperature, how was it measured, and when did it start?"     No 6. RESPIRATORY STATUS: "Describe your breathing?" (e.g., shortness of breath, wheezing, unable to speak)      No 7. BETTER-SAME-WORSE: "Are you getting better, staying the same or getting worse compared to yesterday?"  If getting worse, ask, "In what way?"     Same 8. HIGH RISK DISEASE: "Do you have any chronic medical problems?" (e.g., asthma, heart or lung disease, weak immune system, etc.)     No 9. PREGNANCY: "Is there any chance you are pregnant?" "When was your last menstrual period?"     No 10. OTHER SYMPTOMS: "Do you have any other symptoms?"  (e.g., chills, fatigue, headache, loss of smell or taste, muscle pain, sore throat)       No  Protocols used: CORONAVIRUS (COVID-19) DIAGNOSED OR SUSPECTED-A-AH

## 2019-05-14 NOTE — Telephone Encounter (Signed)
  Telephone note   Pt informed me husband Maria Spencer was in Hospice center in [place] was there and now home due to [place] care at home now lungs filling with fluid, more brain tumors and legs swollen so will not be long   Patient had L red eye and c/w COVID 19 today, right eye is swollen underneath and both eyes slightly swollen, sensitive to light. Eye is crusty and feels like sand left, right eye underneath is swollen and feels like leather, appears she has big red bags under both eyse  Brother in law eye MD in [place] he called tobramyacin/dexamethasone qid b/l x 9 days  No new cosmetic products  She changes husband due to him being incontinent? If bacterial infection near her eye as he gets UTI chronically   She tried warm water washing her eye and compress on her eyes    She thinks she has pressure h/a in eye and had chronic sinusitis   A/P  1. Conjunctivitis L>R/sinusitis  Tobramycin/DMS drops  Add Doxycycline 100 mg bid x 7-10 days with food  No COVID 19 sx's at this time   [place] Time spent 10 minutes telephone  Pt agrees to billing

## 2019-05-17 ENCOUNTER — Telehealth: Payer: Self-pay | Admitting: Internal Medicine

## 2019-05-17 ENCOUNTER — Other Ambulatory Visit: Payer: Self-pay | Admitting: Internal Medicine

## 2019-05-17 ENCOUNTER — Telehealth: Payer: Self-pay

## 2019-05-17 DIAGNOSIS — G4700 Insomnia, unspecified: Secondary | ICD-10-CM

## 2019-05-17 MED ORDER — ATORVASTATIN CALCIUM 10 MG PO TABS: 10.0000 mg | ORAL_TABLET | Freq: Every day | ORAL | 3 refills | Status: DC

## 2019-05-17 MED ORDER — ZOLPIDEM TARTRATE 5 MG PO TABS: 5.0000 mg | ORAL_TABLET | Freq: Every evening | ORAL | 1 refills | Status: DC | PRN

## 2019-05-17 NOTE — Telephone Encounter (Signed)
Copied from [place] [id number]. Topic: Quick Communication - Appointment Cancellation >> May 17, 2019  9:36 AM Rainey Pines A wrote: Patient called to cancel appointment scheduled for 05/18/2019. Patient spoke with provider last week.

## 2019-05-17 NOTE — Telephone Encounter (Signed)
Last OV:  09/08/18  Last Refill:  01/26/19

## 2019-05-17 NOTE — Telephone Encounter (Signed)
Medication Refill - Medication:  atorvastatin (LIPITOR) 20 MG tablet ,zolpidem (AMBIEN) 5 MG tablet    Has the patient contacted their pharmacy? Yes (Agent: If no, request that the patient contact the pharmacy for the refill.) (Agent: If yes, when and what did the pharmacy advise?)Contact PCP  Preferred Pharmacy (with phone number or street name):  [facility] [place] [phone] (Phone) [phone] (Fax)     Agent: Please be advised that RX refills may take up to 3 business days. We ask that you follow-up with your pharmacy.

## 2019-05-17 NOTE — Telephone Encounter (Signed)
Called and spoke to patient.  Patient said that she spoke to Dr. Aundra Dubin last week who called in a Rx and told patient that if Rx worked that she could cancel her appt.  Pt said that Rx was working and requested to cancel appt.  Appt for 05/18/19 was cancelled.

## 2019-05-18 ENCOUNTER — Ambulatory Visit: Payer: Medicare Other | Admitting: Internal Medicine

## 2019-05-31 ENCOUNTER — Ambulatory Visit
Admission: RE | Admit: 2019-05-31 | Discharge: 2019-05-31 | Disposition: A | Discharge status: Home / Self Care | Payer: Medicare Other | Source: Ambulatory Visit | Attending: Internal Medicine | Admitting: Internal Medicine

## 2019-05-31 DIAGNOSIS — Z1231 Encounter for screening mammogram for malignant neoplasm of breast: Secondary | ICD-10-CM | POA: Diagnosis not present

## 2019-05-31 IMAGING — MG DIGITAL SCREENING BILATERAL MAMMOGRAM WITH TOMO AND CAD
8 series · 8 of 24 positions shown · non-contrast
Comparison: Previous exam(s).

CLINICAL DATA: Screening.

EXAM:
DIGITAL SCREENING BILATERAL MAMMOGRAM WITH TOMO AND CAD

[L CC synth-2D]
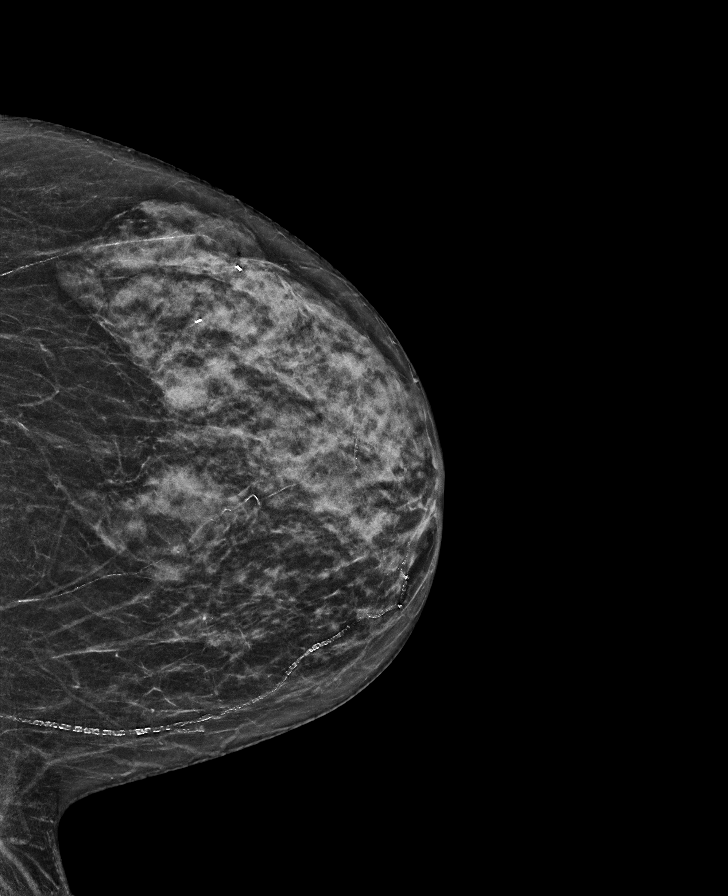

[R CC synth-2D]
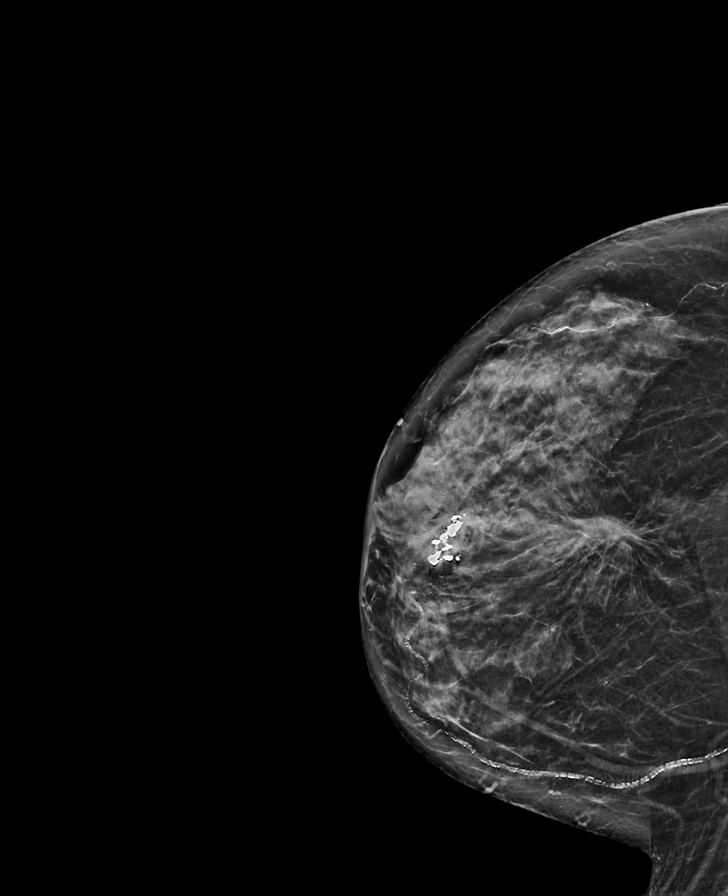

[R MLO synth-2D]
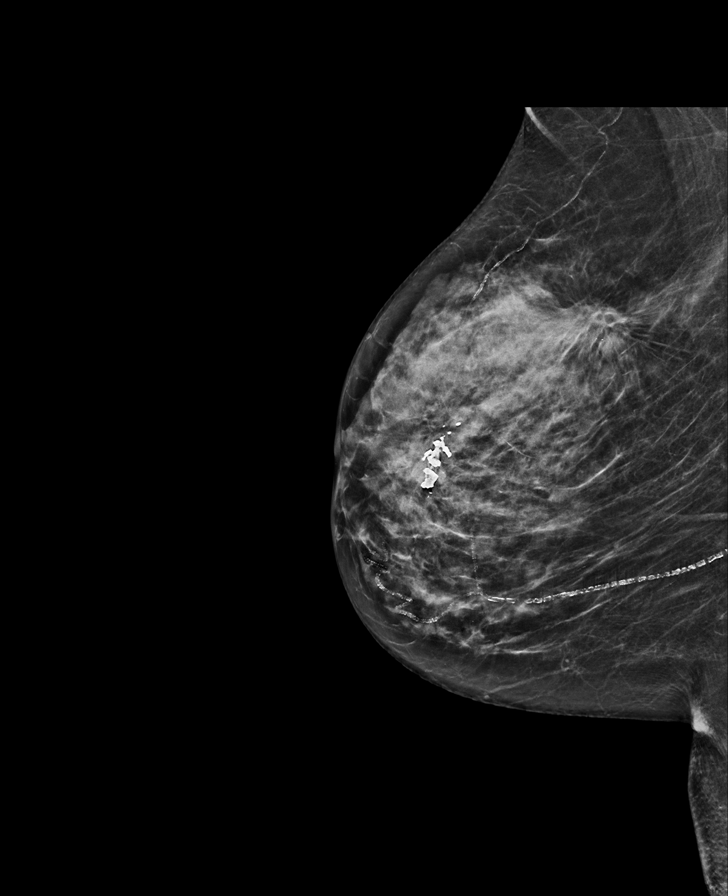

[L MLO synth-2D]
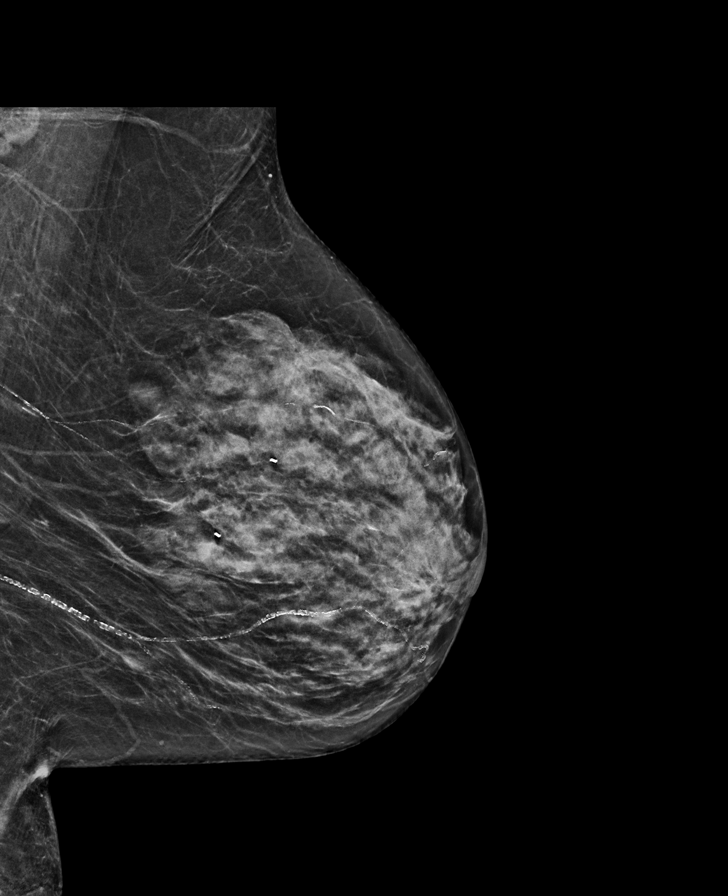

[L MLO tomo · tomo slice 30/59.0]
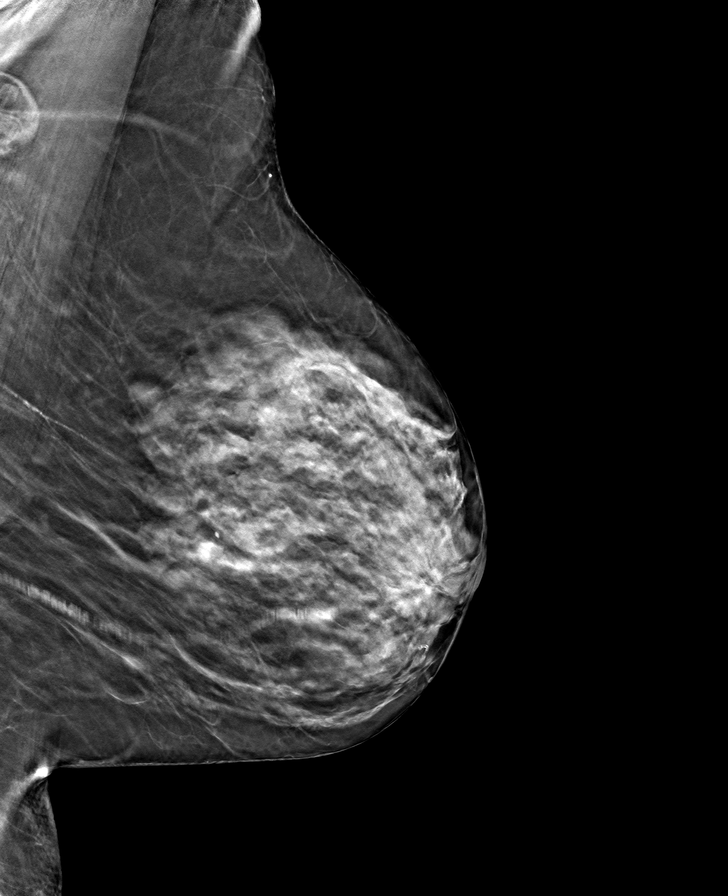

[R MLO tomo · tomo slice 29/58.0]
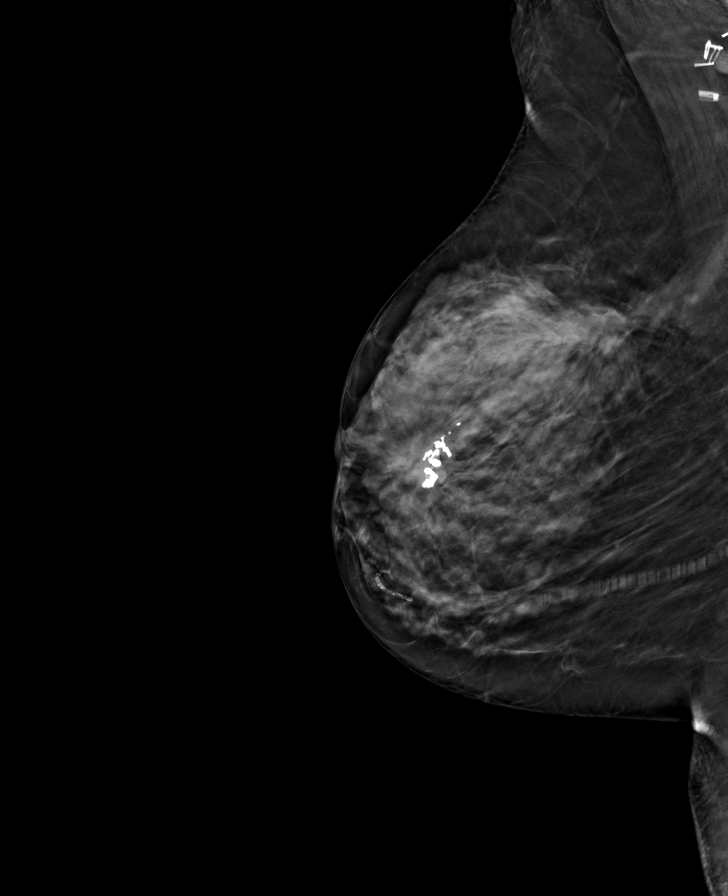

[R CC tomo · tomo slice 35/68.0]
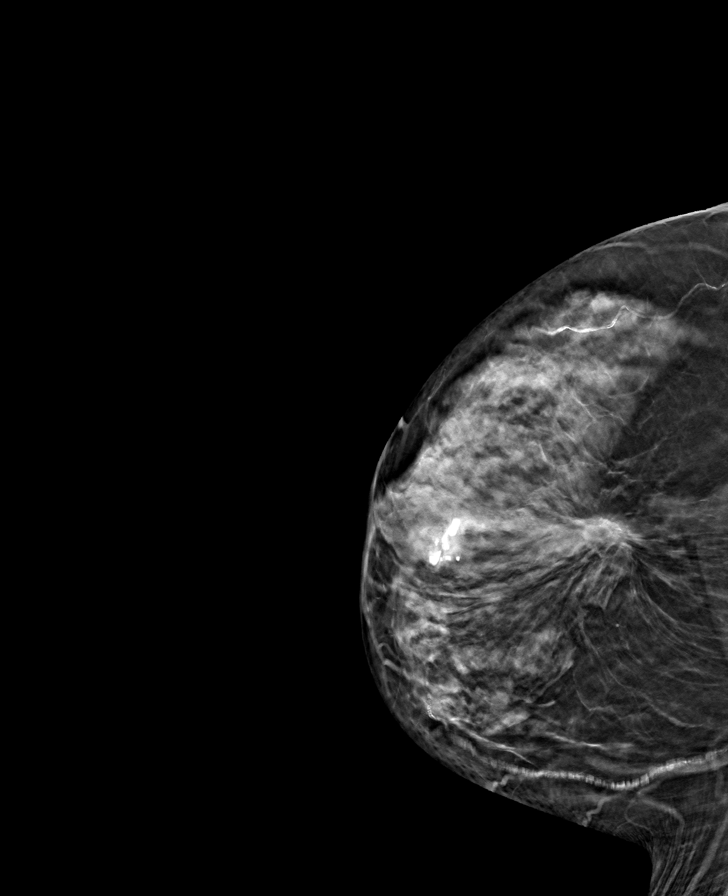

[L CC tomo · tomo slice 28/55.0]
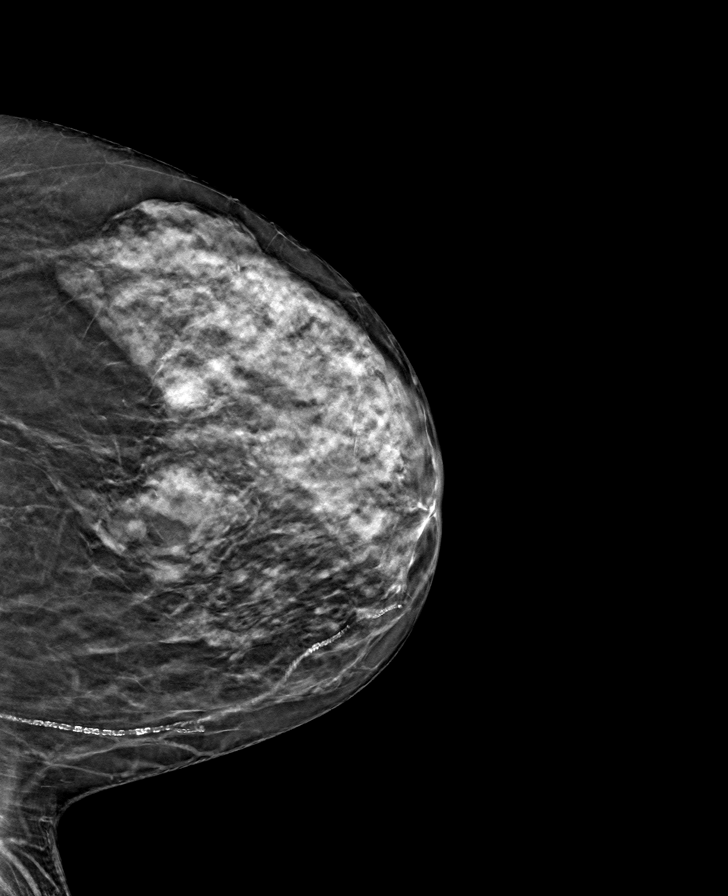

[8 of 24 positions shown; findings below may reference images not displayed]

ACR Breast Density Category c: The breast tissue is heterogeneously
dense, which may obscure small masses.
FINDINGS: There are no findings suspicious for malignancy. Images were
processed with CAD.
IMPRESSION: No mammographic evidence of malignancy. A result letter of this
screening mammogram will be mailed directly to the patient.

RECOMMENDATION:
Screening mammogram in one year. (Code:[5V])

BI-RADS CATEGORY  1: Negative.

## 2019-06-09 ENCOUNTER — Telehealth: Payer: Self-pay

## 2019-06-09 ENCOUNTER — Other Ambulatory Visit: Payer: Self-pay | Admitting: Internal Medicine

## 2019-06-09 DIAGNOSIS — H109 Unspecified conjunctivitis: Secondary | ICD-10-CM

## 2019-06-09 DIAGNOSIS — H5789 Other specified disorders of eye and adnexa: Secondary | ICD-10-CM

## 2019-06-09 NOTE — Telephone Encounter (Signed)
mychart sent to inform patient.

## 2019-06-09 NOTE — Telephone Encounter (Signed)
She needs to see an eye doctor Would she like referral?  [place]

## 2019-06-09 NOTE — Telephone Encounter (Signed)
Copied from [place] [id number]. Topic: General - Other >> Jun 09, 2019 10:01 AM Jodie Echevaria wrote: Reason for CRM: Patient called to ask Dr Olivia Mackie if she can send an Rx to the Pharmacy for her due to eyes being infected again. She stated that she may have Eye lid dermatitis she say she may have gotten too close to a bug spray she been using. Please call patient at Ph# [phone]

## 2019-07-20 ENCOUNTER — Ambulatory Visit (INDEPENDENT_AMBULATORY_CARE_PROVIDER_SITE_OTHER): Payer: Medicare Other | Admitting: Internal Medicine

## 2019-07-20 ENCOUNTER — Other Ambulatory Visit: Payer: Self-pay

## 2019-07-20 ENCOUNTER — Encounter: Payer: Self-pay | Admitting: Internal Medicine

## 2019-07-20 DIAGNOSIS — H60331 Swimmer's ear, right ear: Secondary | ICD-10-CM | POA: Diagnosis not present

## 2019-07-20 MED ORDER — CIPROFLOXACIN HCL 500 MG PO TABS: 500.0000 mg | ORAL_TABLET | Freq: Two times a day (BID) | ORAL | 0 refills | Status: DC

## 2019-07-20 MED ORDER — CIPRODEX 0.3-0.1 % OT SUSP: 4.0000 [drp] | Freq: Two times a day (BID) | OTIC | 0 refills | Status: DC

## 2019-07-20 NOTE — Progress Notes (Signed)
Virtual Visit via Video Note  I connected with Maria Spencer  on 07/20/19 at 11:30 AM EDT by a video enabled telemedicine application and verified that I am speaking with the correct person using two identifiers.  Location patient: home Location provider:work or home office Persons participating in the virtual visit: patient, provider  I discussed the limitations of evaluation and management by telemedicine and the availability of in person appointments. The patient expressed understanding and agreed to proceed.   HPI: C/o right ear pain since last week when she and her son from [facility] went swimming in the pool, ear is mildly painful warm and prev had ear infection after swimming which took 1 month to resolve years ago. Denies fever but she does have some PND+. She feels like water is in her ear and has tried manuevers I.e beating her head to help.   Grief husband recently died going to [place] 09[place]07-[place] to take his ashes and have memorial appt with hospice 07/26/19 she is dealing with this but death is new and has a hard time at times    B/l Eye redness and irritation eye lids she thinks 2/2 crying a lot using hc otc to help per her brother in law who is eye MD in [facility] appt with [facility] not had yet but has another appt pending   ROS: See pertinent positives and negatives per HPI.  Past Medical History:  Diagnosis Date  . Arthritis    neck, back, left knee;   . Breast cancer [facility]) 2009   right lumpectomy s/p radiation   . Chicken pox   . Chronic UTI    established with urology   . Diverticular disease    -osis and -itis   . Hormone disorder   . Hyperlipidemia   . Hypertension    controlled well with medication;   . Personal history of radiation therapy 2009   F/U right breast cancer  . Thyroid disease    hypothyroidism     Past Surgical History:  Procedure Laterality Date  . BONE GRAFT HIP ILIAC CREST     + cage left hip 10/23/17   . BREAST BIOPSY Left 2009   clip,benign  . BREAST  BIOPSY Right 2009   +  . BREAST LUMPECTOMY Right 2009   2009 lumpectomy   . INSERTION OF MESH N/A 05/29/2018   Procedure: INSERTION OF MESH;  Surgeon: Johnathan Hausen, MD;  Location: [facility];  Service: General;  Laterality: N/A;  . SHOULDER SURGERY     x2 surgeries both shoulders, right shoulder replacement last in 2012   . SPINE SURGERY     spinal 09/2017 h/o scoliosis   . TONSILLECTOMY     age 73 y.o.   . TOTAL SHOULDER REPLACEMENT    . VENTRAL HERNIA REPAIR N/A 05/29/2018   Procedure: LAPAROSCOPIC VENTRAL / [place];  Surgeon: Johnathan Hausen, MD;  Location: [facility];  Service: General;  Laterality: N/A;    Family History  Problem Relation Age of Onset  . Arthritis Mother   . Heart disease Mother   . Hyperlipidemia Mother   . Hypertension Mother   . Arthritis Father   . Diabetes Father   . Cancer Father        colon  . Arthritis Sister   . Hyperlipidemia Sister   . Hypertension Sister   . Cancer Brother        lung, smoker  . Mental retardation Brother   . Drug abuse Daughter   .  Arthritis Sister   . Hyperlipidemia Sister   . Bladder Cancer Neg Hx   . Kidney cancer Neg Hx     SOCIAL HX: widowed    Current Outpatient Medications:  .  amLODipine (NORVASC) 5 MG tablet, Take 1 tablet (5 mg total) by mouth daily., Disp: 90 tablet, Rfl: 1 .  aspirin EC 81 MG tablet, Take 1 tablet (81 mg total) by mouth daily., Disp: , Rfl:  .  atorvastatin (LIPITOR) 10 MG tablet, Take 1 tablet (10 mg total) by mouth daily at 6 PM. Note 10 mg no need to cut in 1/2 as before you have 20 mg taking 1/2 pill =10 mg, Disp: 90 tablet, Rfl: 3 .  Cholecalciferol (VITAMIN D3 PO), Take 1 capsule by mouth daily., Disp: , Rfl:  .  ciprofloxacin (CIPRO) 500 MG tablet, Take 1 tablet (500 mg total) by mouth 2 (two) times daily. With food, Disp: 14 tablet, Rfl: 0 .  ciprofloxacin-dexamethasone (CIPRODEX) OTIC suspension, Place 4 drops into the right ear 2 (two) times daily. X 1 week, Disp: 7.5 mL,  Rfl: 0 .  doxycycline (VIBRA-TABS) 100 MG tablet, Take 1 tablet (100 mg total) by mouth 2 (two) times daily. X 7 to 10 days with food, Disp: 20 tablet, Rfl: 0 .  fluticasone (FLONASE) 50 MCG/ACT nasal spray, Place 1 spray into both nostrils as needed for allergies or rhinitis., Disp: , Rfl:  .  levothyroxine (SYNTHROID, LEVOTHROID) 88 MCG tablet, Take 1 tablet (88 mcg total) by mouth daily before breakfast., Disp: 90 tablet, Rfl: 3 .  lisinopril (PRINIVIL,ZESTRIL) 20 MG tablet, Take 20 mg by mouth daily., Disp: , Rfl:  .  LORazepam (ATIVAN) 0.5 MG tablet, Take 0.5 tablets (0.25 mg total) by mouth 2 (two) times daily as needed for anxiety., Disp: 15 tablet, Rfl: 2 .  Multiple Vitamin (MULTI-VITAMINS) TABS, Take 1 tablet by mouth daily. , Disp: , Rfl:  .  sertraline (ZOLOFT) 50 MG tablet, Take 1 tablet (50 mg total) by mouth daily. In am, Disp: 90 tablet, Rfl: 3 .  zolpidem (AMBIEN) 5 MG tablet, Take 1 tablet (5 mg total) by mouth at bedtime as needed for sleep., Disp: 90 tablet, Rfl: 1  EXAM:  VITALS per patient if applicable:  GENERAL: alert, oriented, appears well and in no acute distress  HEENT: atraumatic, conjunttiva clear, no obvious abnormalities on inspection of external nose and ears  NECK: normal movements of the head and neck  LUNGS: on inspection no signs of respiratory distress, breathing rate appears normal, no obvious gross SOB, gasping or wheezing  CV: no obvious cyanosis  MS: moves all visible extremities without noticeable abnormality  PSYCH/NEURO: pleasant and cooperative, no obvious depression or anxiety, speech and thought processing grossly intact  ASSESSMENT AND PLAN:  Discussed the following assessment and plan:  Acute swimmer's ear of right side - Plan: ciprofloxacin-dexamethasone (CIPRODEX) OTIC suspension, ciprofloxacin (CIPRO) 500 MG tablet Also dis debrox for swimmers ear otc [facility] to use in future if not better  HM Had flu shot 08/07/18  prevnar had   Declines Tdap, shingrix, hep B vaccine for now  zostervax had 2012  Never smoker    s/p right breast lumpectomy 2009 and radiation only  -mammogram 05/31/19 negative  Pap out of age window no h/o abnormal pap  DEXA h/o osteopenia wait on repeat in future offer disc'ed again at f/u appt  Colonoscopy copy obtained reviewed 01/10/09 grade 1 IH and diverticulosis  -colonoscopy due at f/u  rec healthy  diet and exercise    I discussed the assessment and treatment plan with the patient. The patient was provided an opportunity to ask questions and all were answered. The patient agreed with the plan and demonstrated an understanding of the instructions.   The patient was advised to call back or seek an in-person evaluation if the symptoms worsen or if the condition fails to improve as anticipated.  Time spent 15 minutes  Delorise Jackson, MD

## 2019-07-20 NOTE — Addendum Note (Signed)
Addended by: Orland Mustard on: 07/20/2019 05:54 PM   Modules accepted: Orders

## 2019-07-21 ENCOUNTER — Other Ambulatory Visit: Payer: Self-pay | Admitting: Internal Medicine

## 2019-07-21 ENCOUNTER — Telehealth: Payer: Self-pay

## 2019-07-21 DIAGNOSIS — E039 Hypothyroidism, unspecified: Secondary | ICD-10-CM

## 2019-07-21 DIAGNOSIS — H9201 Otalgia, right ear: Secondary | ICD-10-CM

## 2019-07-21 MED ORDER — NEOMYCIN-POLYMYXIN-HC 3.5-10000-1 OT SOLN: 4.0000 [drp] | Freq: Four times a day (QID) | OTIC | 0 refills | Status: DC

## 2019-07-21 MED ORDER — LEVOTHYROXINE SODIUM 88 MCG PO TABS: 88.0000 ug | ORAL_TABLET | Freq: Every day | ORAL | 3 refills | Status: DC

## 2019-07-21 NOTE — Telephone Encounter (Signed)
The generic is $211. Pt would like something different sent in

## 2019-07-21 NOTE — Telephone Encounter (Signed)
Copied from [place] [id number]. Topic: General - Other >> Jul 21, 2019  9:09 AM Leward Quan A wrote: Reason for CRM: Patient called to say that the Rx sent to the pharmacy for ciprofloxacin-dexamethasone (CIPRODEX) OTIC suspension because it is $240 and she cannot afford that. She stated thatt the pharmacist told her that there was a generic brand available. Patient would like a new Rx sent to the pharmacy please. Call back # [phone]

## 2019-07-21 NOTE — Telephone Encounter (Signed)
Call pharmacy ok to dispense generic   Maria Spencer

## 2019-08-09 DIAGNOSIS — Z23 Encounter for immunization: Secondary | ICD-10-CM | POA: Diagnosis not present

## 2019-08-10 DIAGNOSIS — M7061 Trochanteric bursitis, right hip: Secondary | ICD-10-CM | POA: Insufficient documentation

## 2019-08-10 DIAGNOSIS — M545 Low back pain: Secondary | ICD-10-CM | POA: Diagnosis not present

## 2019-08-17 DIAGNOSIS — H40033 Anatomical narrow angle, bilateral: Secondary | ICD-10-CM | POA: Diagnosis not present

## 2019-08-17 DIAGNOSIS — H26493 Other secondary cataract, bilateral: Secondary | ICD-10-CM | POA: Diagnosis not present

## 2019-08-19 DIAGNOSIS — H2512 Age-related nuclear cataract, left eye: Secondary | ICD-10-CM | POA: Insufficient documentation

## 2019-08-19 DIAGNOSIS — Z9889 Other specified postprocedural states: Secondary | ICD-10-CM | POA: Diagnosis not present

## 2019-08-19 DIAGNOSIS — H2511 Age-related nuclear cataract, right eye: Secondary | ICD-10-CM | POA: Insufficient documentation

## 2019-08-19 DIAGNOSIS — H2513 Age-related nuclear cataract, bilateral: Secondary | ICD-10-CM | POA: Diagnosis not present

## 2019-08-27 ENCOUNTER — Telehealth: Payer: Self-pay | Admitting: Internal Medicine

## 2019-08-27 ENCOUNTER — Other Ambulatory Visit: Payer: Self-pay | Admitting: Internal Medicine

## 2019-08-27 DIAGNOSIS — F419 Anxiety disorder, unspecified: Secondary | ICD-10-CM

## 2019-08-27 MED ORDER — LORAZEPAM 0.5 MG PO TABS: 0.2500 mg | ORAL_TABLET | Freq: Two times a day (BID) | ORAL | 2 refills | Status: DC | PRN

## 2019-08-27 NOTE — Telephone Encounter (Signed)
Pt husband passed away on 06-29-2019 and pt would like a refill on lorazepam. [facility] [address]. Pt did not take med in nov when it was prescribed

## 2019-08-27 NOTE — Telephone Encounter (Signed)
Sent ativan 0.25-0.5 qd to bid prn    TMS

## 2019-09-01 DIAGNOSIS — M25559 Pain in unspecified hip: Secondary | ICD-10-CM | POA: Diagnosis not present

## 2019-09-01 DIAGNOSIS — E168 Other specified disorders of pancreatic internal secretion: Secondary | ICD-10-CM | POA: Diagnosis not present

## 2019-09-01 DIAGNOSIS — Z981 Arthrodesis status: Secondary | ICD-10-CM | POA: Diagnosis not present

## 2019-09-01 DIAGNOSIS — M5416 Radiculopathy, lumbar region: Secondary | ICD-10-CM | POA: Diagnosis not present

## 2019-09-01 DIAGNOSIS — Z6825 Body mass index (BMI) 25.0-25.9, adult: Secondary | ICD-10-CM | POA: Diagnosis not present

## 2019-09-01 DIAGNOSIS — M419 Scoliosis, unspecified: Secondary | ICD-10-CM | POA: Diagnosis not present

## 2019-09-01 DIAGNOSIS — M4185 Other forms of scoliosis, thoracolumbar region: Secondary | ICD-10-CM | POA: Diagnosis not present

## 2019-09-01 DIAGNOSIS — M25551 Pain in right hip: Secondary | ICD-10-CM | POA: Diagnosis not present

## 2019-09-02 ENCOUNTER — Other Ambulatory Visit: Payer: Self-pay | Admitting: Nurse Practitioner

## 2019-09-02 DIAGNOSIS — M5416 Radiculopathy, lumbar region: Secondary | ICD-10-CM

## 2019-09-08 ENCOUNTER — Other Ambulatory Visit: Payer: Self-pay

## 2019-09-10 ENCOUNTER — Ambulatory Visit (INDEPENDENT_AMBULATORY_CARE_PROVIDER_SITE_OTHER): Payer: Medicare Other

## 2019-09-10 ENCOUNTER — Ambulatory Visit (INDEPENDENT_AMBULATORY_CARE_PROVIDER_SITE_OTHER): Payer: Medicare Other | Admitting: Internal Medicine

## 2019-09-10 ENCOUNTER — Other Ambulatory Visit: Payer: Self-pay

## 2019-09-10 VITALS — BP 152/90 | HR 78 | Temp 98.1°F | Ht 59.0 in | Wt 126.4 lb

## 2019-09-10 DIAGNOSIS — M546 Pain in thoracic spine: Secondary | ICD-10-CM | POA: Diagnosis not present

## 2019-09-10 DIAGNOSIS — E2839 Other primary ovarian failure: Secondary | ICD-10-CM | POA: Diagnosis not present

## 2019-09-10 DIAGNOSIS — M25551 Pain in right hip: Secondary | ICD-10-CM

## 2019-09-10 DIAGNOSIS — Z0000 Encounter for general adult medical examination without abnormal findings: Secondary | ICD-10-CM

## 2019-09-10 DIAGNOSIS — R079 Chest pain, unspecified: Secondary | ICD-10-CM

## 2019-09-10 DIAGNOSIS — H269 Unspecified cataract: Secondary | ICD-10-CM | POA: Diagnosis not present

## 2019-09-10 DIAGNOSIS — K219 Gastro-esophageal reflux disease without esophagitis: Secondary | ICD-10-CM | POA: Diagnosis not present

## 2019-09-10 DIAGNOSIS — F419 Anxiety disorder, unspecified: Secondary | ICD-10-CM | POA: Diagnosis not present

## 2019-09-10 DIAGNOSIS — F4321 Adjustment disorder with depressed mood: Secondary | ICD-10-CM | POA: Diagnosis not present

## 2019-09-10 DIAGNOSIS — M549 Dorsalgia, unspecified: Secondary | ICD-10-CM | POA: Diagnosis not present

## 2019-09-10 DIAGNOSIS — R739 Hyperglycemia, unspecified: Secondary | ICD-10-CM | POA: Diagnosis not present

## 2019-09-10 DIAGNOSIS — R002 Palpitations: Secondary | ICD-10-CM

## 2019-09-10 DIAGNOSIS — K209 Esophagitis, unspecified without bleeding: Secondary | ICD-10-CM

## 2019-09-10 DIAGNOSIS — I10 Essential (primary) hypertension: Secondary | ICD-10-CM | POA: Diagnosis not present

## 2019-09-10 DIAGNOSIS — Z1329 Encounter for screening for other suspected endocrine disorder: Secondary | ICD-10-CM | POA: Diagnosis not present

## 2019-09-10 IMAGING — DX DG THORACIC SPINE 2V
3 series · 3 of 3 positions shown · non-contrast
Comparison: CTA chest [DATE] multilevel discogenic and facet
degenerative changes are present in thoracic spine. Upper thoracic
levels

CLINICAL DATA: Mid back pain post surgery

EXAM:
THORACIC SPINE 2 VIEWS

[thoracic spine ap]
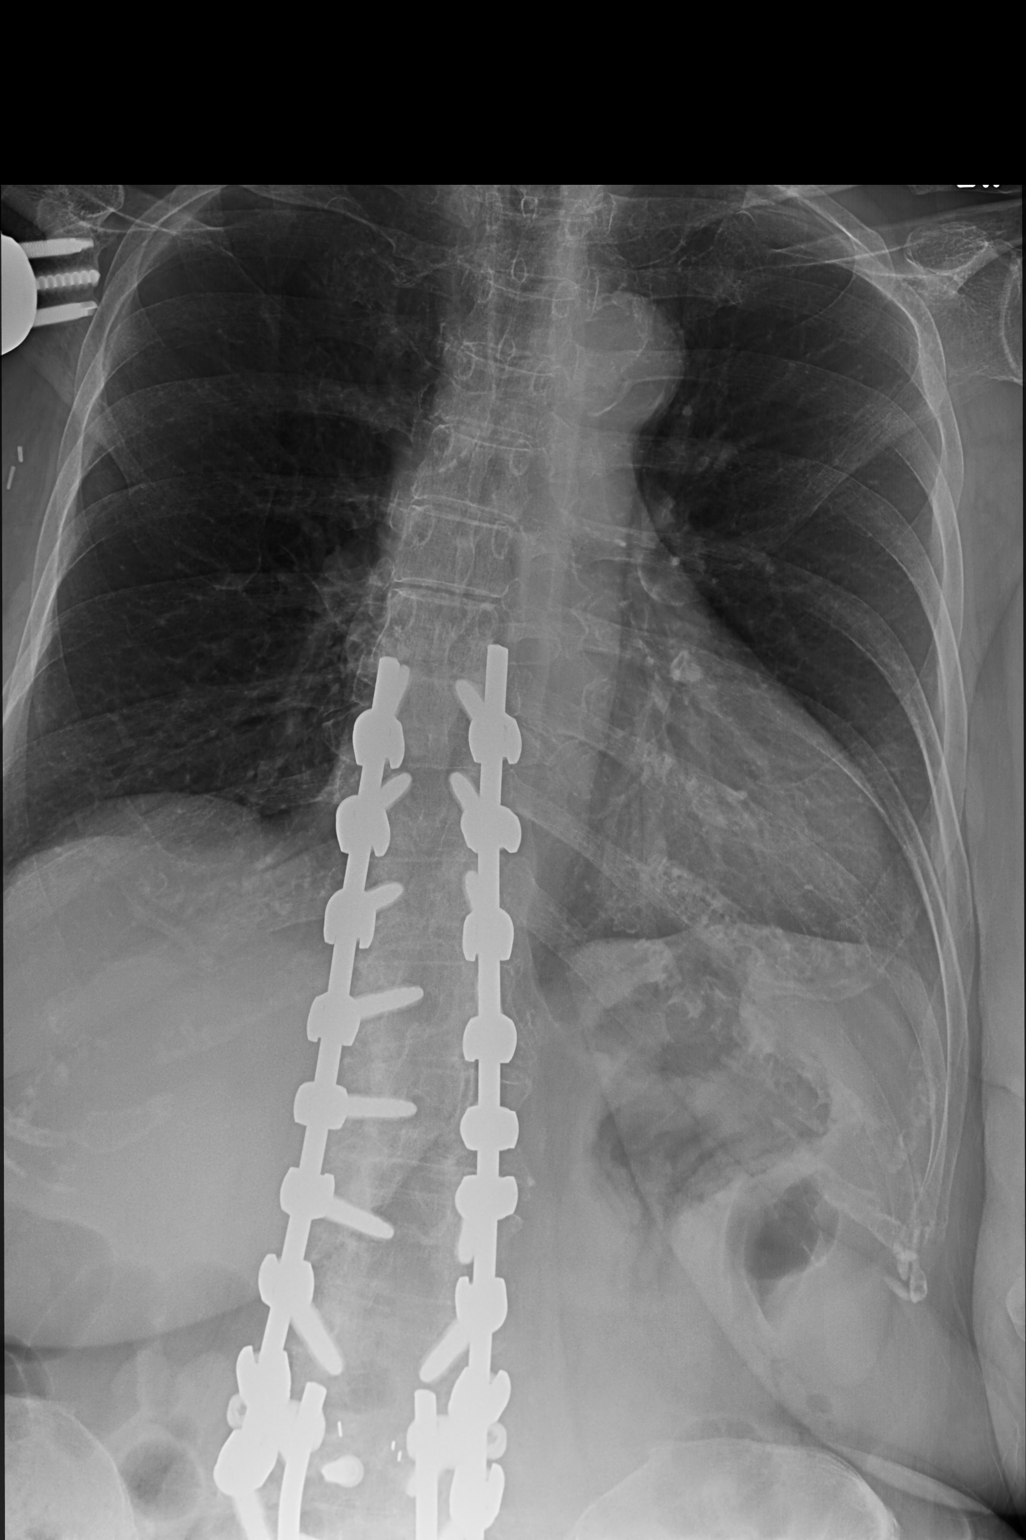

[thoracic spine lat]
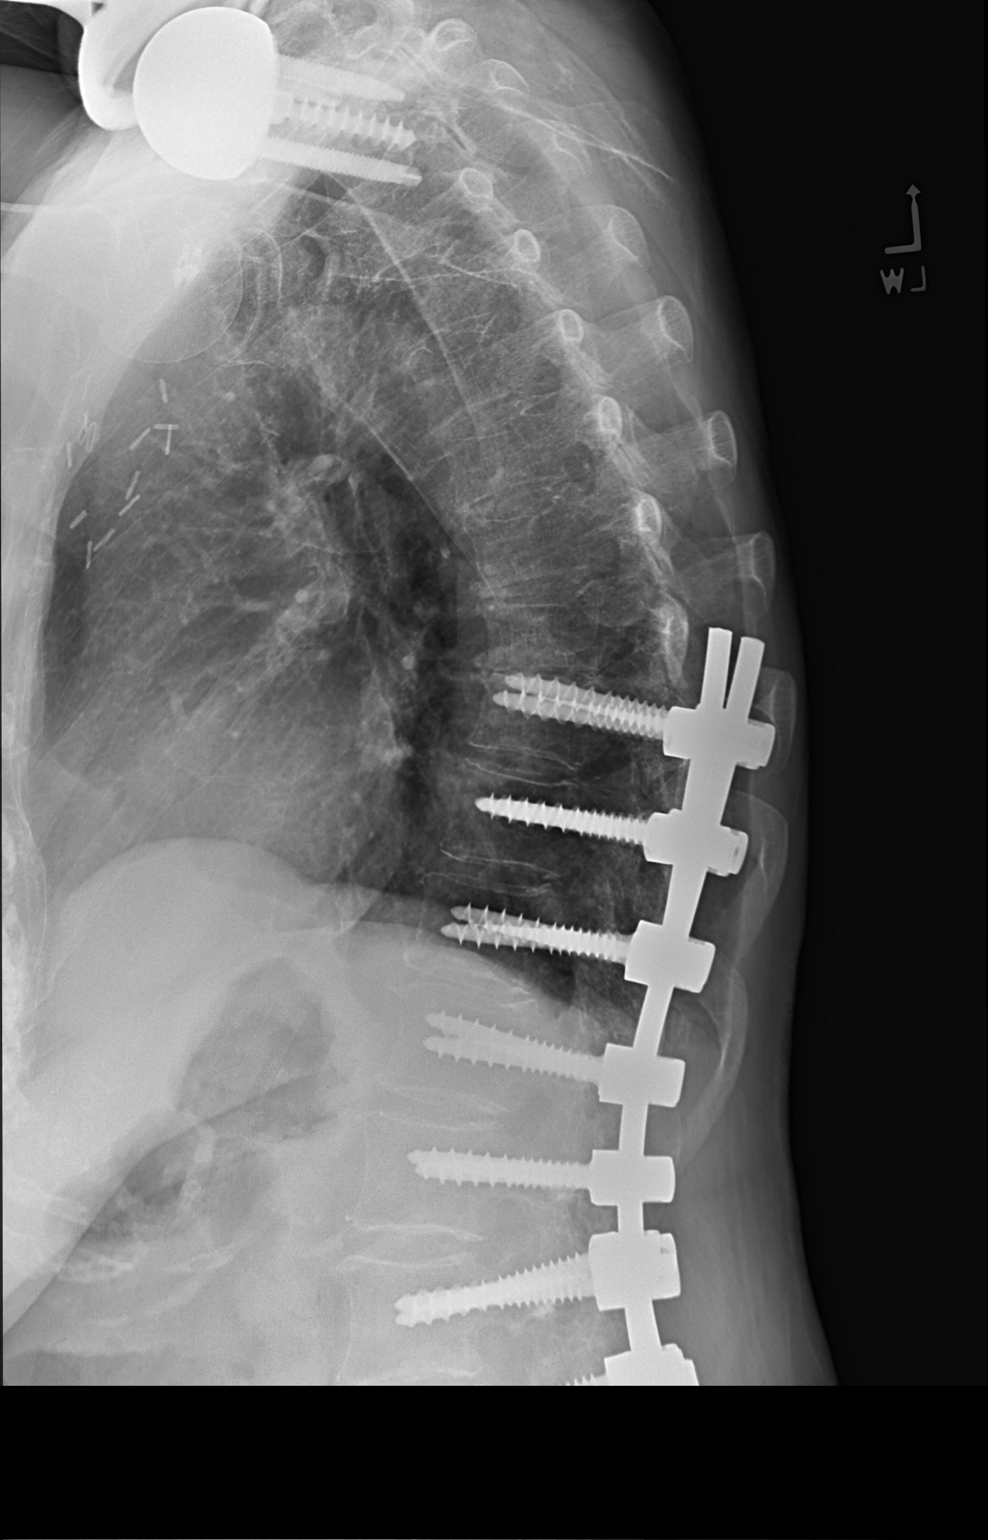

[swimmers lat]
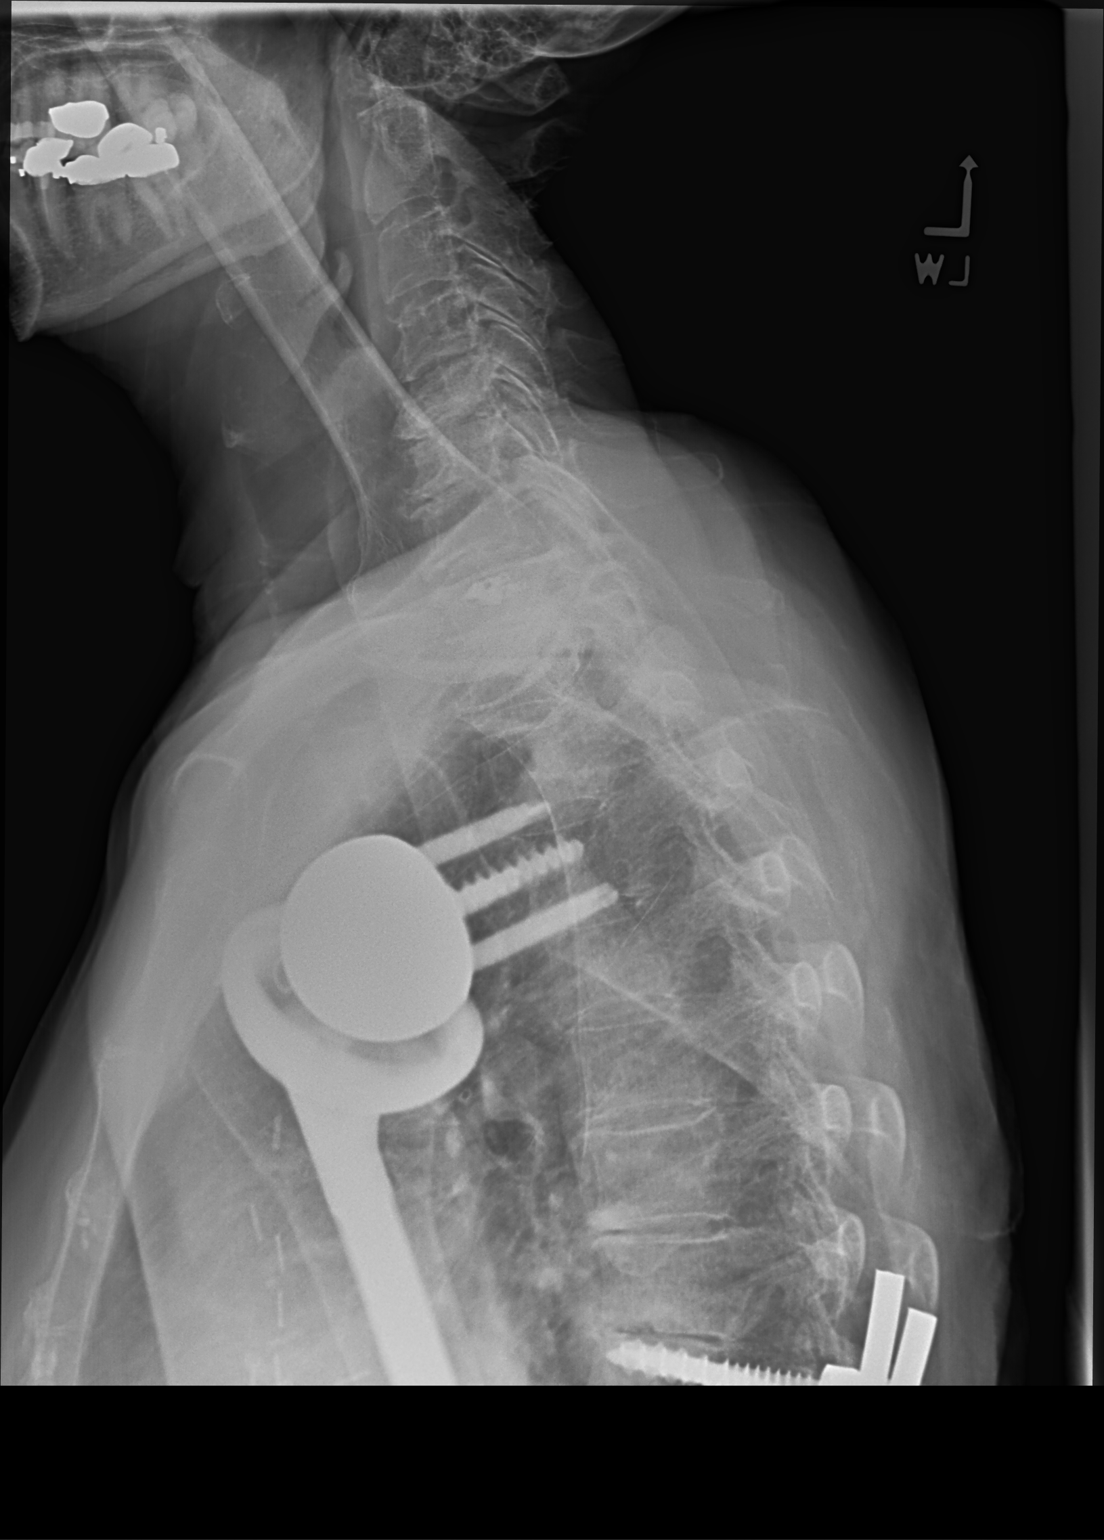

[3 of 3 positions shown; findings below may reference images not displayed]

FINDINGS: Partially imaged T10-S1 long segment spinal fusion is visualized. No
evidence of acute hardware fracture or complication in the included
portions of the hardware. There are postsurgical changes from
reverse right shoulder hemiarthroplasty with additional surgical
clips in the right axilla. No acute fracture or vertebral body
height loss is evident. Multilevel discogenic changes are present in
the thoracic spine at the non instrumented levels. Mild adjacent
segment disease at T9.
IMPRESSION: 1. Partially imaged T10-S1 long segment spinal fusion without
evidence of hardware complication.
2. Mild adjacent segment disease at T9-10.
3. No acute fracture or traumatic malalignment.
4. Multilevel discogenic changes elsewhere throughout the thoracic
spine.

## 2019-09-10 IMAGING — DX DG HIP (WITH OR WITHOUT PELVIS) 2-3V*R*
3 series · 3 of 3 positions shown · non-contrast
Comparison: CT abdomen pelvis [DATE]

CLINICAL DATA: Right hip pain post surgery

EXAM:
DG HIP (WITH OR WITHOUT PELVIS) 2-3V RIGHT

[pelvis ap]
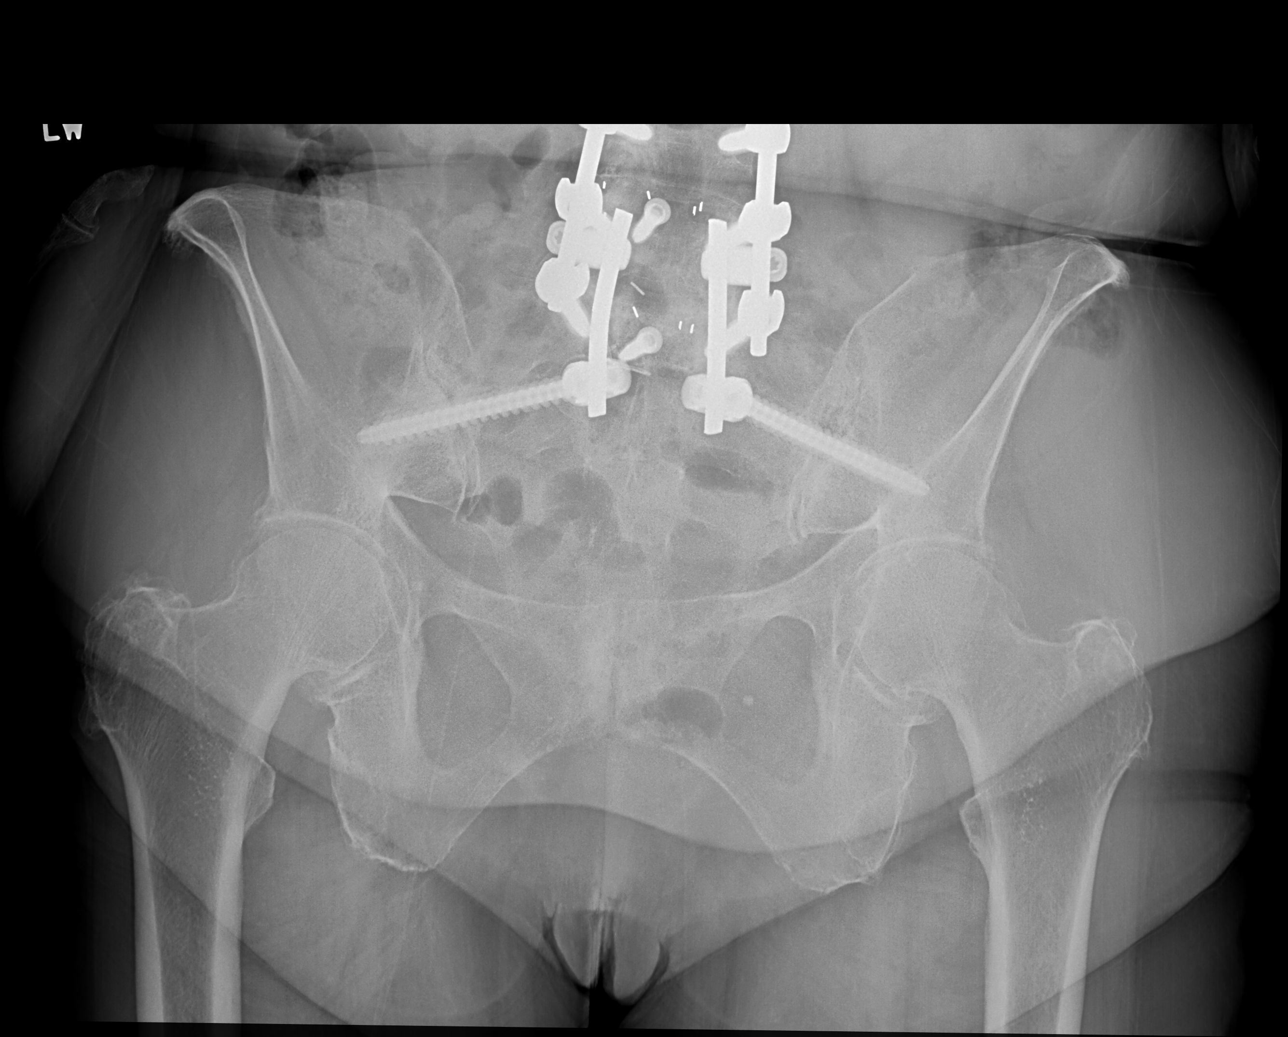

[hip joint ap]
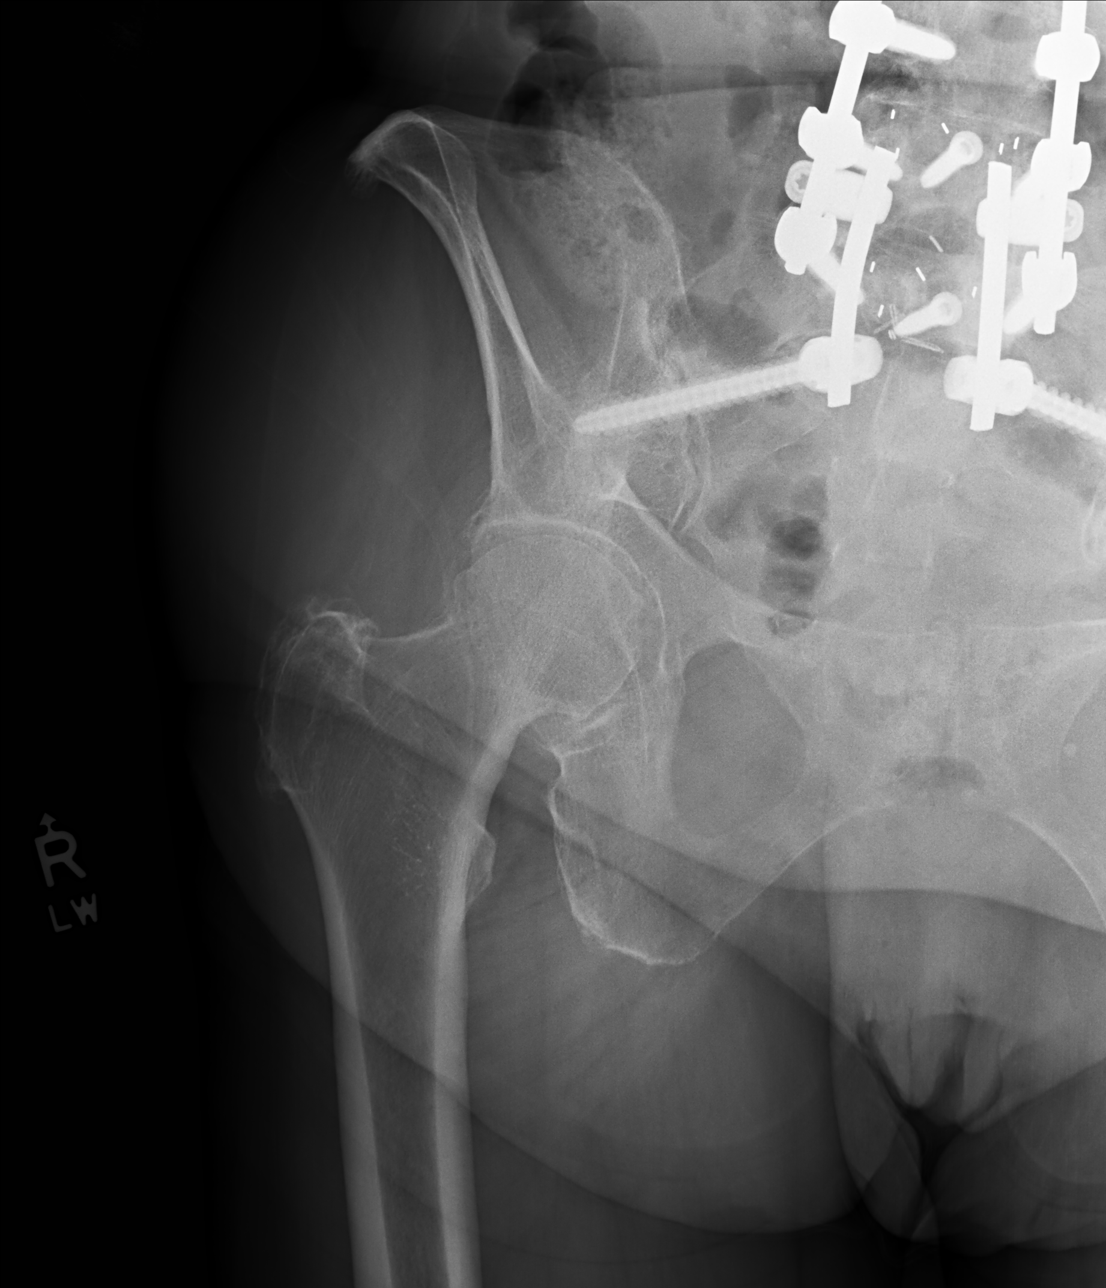

[ap frog obl (oblique)]
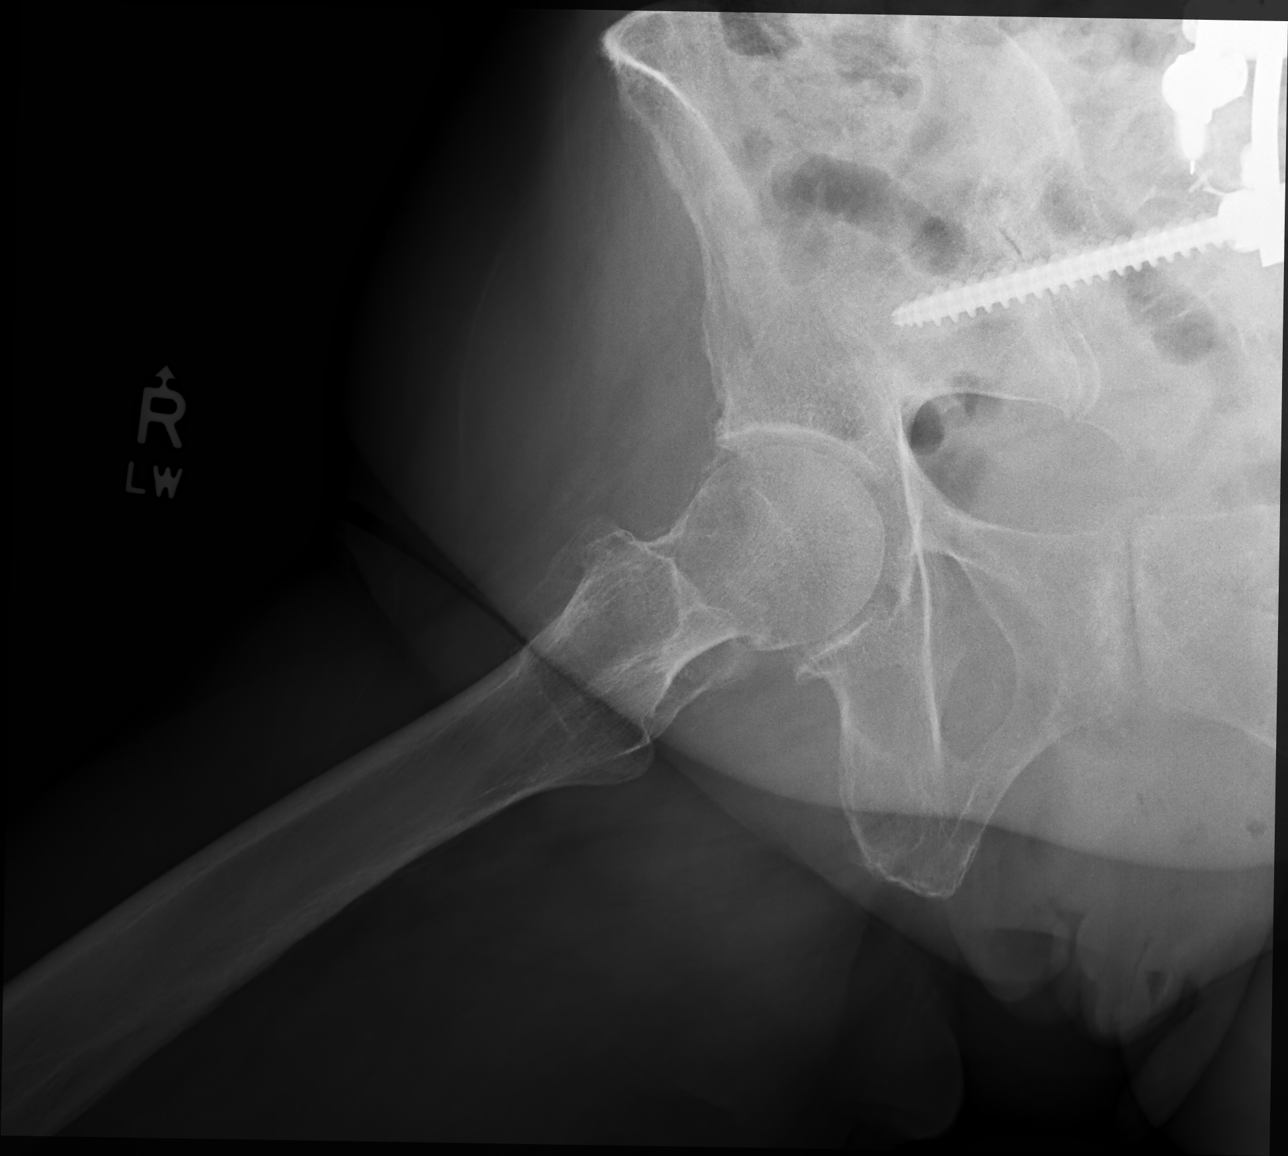

[3 of 3 positions shown; findings below may reference images not displayed]

FINDINGS: Inferior extent of the patient's along segment thoracic to sacral
fusion is seen. There is some periprosthetic lucency about the
sacral fusion screws not clearly present on comparison CT of the
abdomen and pelvis [DATE]. Degenerative changes are noted at the
SI joints and both hips and at the symphysis pubis. No acute
fracture or or traumatic malalignment of the hips or pelvis are
seen. Soft tissues are unremarkable.
IMPRESSION: Inferior extent of the patient's thoracic to sacral fusion is seen.
No hardware fracture is seen.

Question increasing periprosthetic lucency about the sacral fusion
screws, not clearly present on comparison CT of the abdomen and
pelvis [DATE]. could reflect early features of hardware
loosening.

No acute fracture or traumatic malalignment.

## 2019-09-10 MED ORDER — OMEPRAZOLE 40 MG PO CPDR: 40.0000 mg | DELAYED_RELEASE_CAPSULE | Freq: Every day | ORAL | 3 refills | Status: DC

## 2019-09-10 MED ORDER — LISINOPRIL 30 MG PO TABS: 30.0000 mg | ORAL_TABLET | Freq: Every day | ORAL | 3 refills | Status: DC

## 2019-09-10 MED ORDER — FLUTICASONE PROPIONATE 50 MCG/ACT NA SUSP: 2.0000 | Freq: Every day | NASAL | 4 refills | Status: DC

## 2019-09-10 NOTE — Progress Notes (Signed)
No chief complaint on file.  F/u multiple issues  1. Grief x 2 months after death of husband and feels guilty he was in hospice and she was not at bedside when he died. She has had increased anxiety since his death but has not tried ativan due to her daughter died of an overdose 2 years ago and another relative died of an overdose recently. She is undecided if will move back to [place] closer to family or back to [place] where she came before [place]. Only close relatives she has are her BIL in [place] who she has been less in contact with since husbands death.    2. C/o heartburn and chest pressure to mid back at times associated with sob with exertion and palpitations.  Of note she has a h/o esophagitis noted 05/17/14 egd and is not taking prilosec or any ppi  Sxs worse since her husband died--> explained today could be uncontrolled anxiety, heartburn need to r/o cardiac cause   3. HTN elevated today on lis 20 mg qd  4. C/o mid back pain s/p lumbar spine surgery MRI of L spine upcoming 09/13/19 will Xray mid back today. Pain radiates to right hip and affect her gait and unable to exercise. 6/10 constant lidocaine topically does not help, tylenol no help  Recent visit with [facility] referred to ortho and PM&R in [facility] but pt prefers to go locally   5. Cataracts b/l eyes surgery 10/2019 at [facility] in [place] with coordinated help of another BIL who is eye doctor in [place]  Constitutional: Negative for weight loss.  HENT: Negative for hearing loss.   Eyes: Negative for blurred vision.  Respiratory: Positive for shortness of breath.   Cardiovascular: Positive for chest pain and palpitations.  Gastrointestinal: Positive for heartburn.  Musculoskeletal: Positive for back pain and joint pain.       +abnormal gait    Skin: Negative for rash.  Neurological: Negative for headaches.  Psychiatric/Behavioral: The patient is nervous/anxious.        +grief    Past Medical History:   Diagnosis Date  . Arthritis    neck, back, left knee;   . Breast cancer [facility]) 2009   right lumpectomy s/p radiation   . Chicken pox   . Chronic UTI    established with urology   . Diverticular disease    -osis and -itis   . Esophagitis    egd 05/17/14 see report scanned into chart  . Hormone disorder   . Hyperlipidemia   . Hypertension    controlled well with medication;   . Personal history of radiation therapy 2009   F/U right breast cancer  . Thyroid disease    hypothyroidism    Past Surgical History:  Procedure Laterality Date  . BONE GRAFT HIP ILIAC CREST     + cage left hip 10/23/17   . BREAST BIOPSY Left 2009   clip,benign  . BREAST BIOPSY Right 2009   +  . BREAST LUMPECTOMY Right 2009   2009 lumpectomy   . EYE SURGERY     b/l cataracts sch 10/2019 in [place]  . INSERTION OF MESH N/A 05/29/2018   Procedure: INSERTION OF MESH;  Surgeon: Johnathan Hausen, MD;  [facility];  Service: General;  Laterality: N/A;  . SHOULDER SURGERY     x2 surgeries both shoulders, right shoulder replacement last in 2012   . SPINE SURGERY  10/23/2017   spinal 09/2017 h/o  scoliosis [facility] L4-S1 OLIF T10 to ililum fusion  . TONSILLECTOMY     age 29 y.o.   . TOTAL SHOULDER REPLACEMENT    . VENTRAL HERNIA REPAIR N/A 05/29/2018   Procedure: LAPAROSCOPIC VENTRAL / [place];  Surgeon: Johnathan Hausen, MD;  Location: [facility];  Service: General;  Laterality: N/A;   Family History  Problem Relation Age of Onset  . Arthritis Mother   . Heart disease Mother   . Hyperlipidemia Mother   . Hypertension Mother   . Arthritis Father   . Diabetes Father   . Cancer Father        colon  . Arthritis Sister   . Hyperlipidemia Sister   . Hypertension Sister   . Cancer Brother        lung, smoker  . Mental retardation Brother   . Drug abuse Daughter        overdose in 2018   . Arthritis Sister   . Hyperlipidemia Sister   . Bladder Cancer Neg Hx   . Kidney cancer Neg Hx    Social  History   Socioeconomic History  . Marital status: Widowed    Spouse name: Not on file  . Number of children: Not on file  . Years of education: Not on file  . Highest education level: Not on file  Occupational History  . Not on file  Social Needs  . Financial resource strain: Not hard at all  . Food insecurity    Worry: Never true    Inability: Never true  . Transportation needs    Medical: No    Non-medical: No  Tobacco Use  . Smoking status: Never Smoker  . Smokeless tobacco: Never Used  Substance and Sexual Activity  . Alcohol use: Not Currently    Alcohol/week: 1.0 standard drinks    Types: 1 Glasses of wine per week    Comment: nightly  . Drug use: No  . Sexual activity: Not on file  Lifestyle  . Physical activity    Days per week: 5 days    Minutes per session: 30 min  . Stress: Rather much  Relationships  . Social Herbalist on phone: Not on file    Gets together: Not on file    Attends religious service: Not on file    Active member of club or organization: Not on file    Attends meetings of clubs or organizations: Not on file    Relationship status: Widowed  . Intimate partner violence    Fear of current or ex partner: No    Emotionally abused: No    Physically abused: No    Forced sexual activity: No  Other Topics Concern  . Not on file  Social History Narrative   College grad   Married husband died 2019/08/18    Wears seatbelt and safe in relationship    3 kids       [place]   Current Meds  Medication Sig  . aspirin EC 81 MG tablet Take 1 tablet (81 mg total) by mouth daily.  Marland Kitchen atorvastatin (LIPITOR) 10 MG tablet Take 1 tablet (10 mg total) by mouth daily at 6 PM. Note 10 mg no need to cut in 1/2 as before you have 20 mg taking 1/2 pill =10 mg  . Cholecalciferol (VITAMIN D3 PO) Take 1 capsule by mouth daily.  . fluticasone (FLONASE) 50 MCG/ACT nasal spray Place 2 sprays into both nostrils daily. Max  b/l nostrils  .  levothyroxine (SYNTHROID) 88 MCG tablet Take 1 tablet (88 mcg total) by mouth daily before breakfast. 30 minutes  . lisinopril (ZESTRIL) 30 MG tablet Take 1 tablet (30 mg total) by mouth daily.  . Multiple Vitamin (MULTI-VITAMINS) TABS Take 1 tablet by mouth daily.   Marland Kitchen neomycin-polymyxin-hydrocortisone (CORTISPORIN) OTIC solution Place 4 drops into the right ear 4 (four) times daily. X 1 week  . zolpidem (AMBIEN) 5 MG tablet Take 1 tablet (5 mg total) by mouth at bedtime as needed for sleep.  . [DISCONTINUED] fluticasone (FLONASE) 50 MCG/ACT nasal spray Place 1 spray into both nostrils as needed for allergies or rhinitis.  . [DISCONTINUED] lisinopril (PRINIVIL,ZESTRIL) 20 MG tablet Take 20 mg by mouth daily.  . [DISCONTINUED] LORazepam (ATIVAN) 0.5 MG tablet Take 0.5-1 tablets (0.25-0.5 mg total) by mouth 2 (two) times daily as needed for anxiety.   Allergies  Allergen Reactions  . Sulfa Antibiotics Anaphylaxis, Hives and Itching  . Norvasc [Amlodipine Besylate]     Gas, bloating     Recent Results (from the past 2160 hour(s))  Comprehensive metabolic panel     Status: Abnormal   Collection Time: 09/10/19  2:56 PM  Result Value Ref Range   Glucose, Bld 90 65 - 99 mg/dL    Comment: .            Fasting reference interval .    BUN 16 7 - 25 mg/dL   Creat 0.57 (L) 0.60 - 0.93 mg/dL    Comment: For patients >64 years of age, the reference limit for Creatinine is approximately 13% higher for people identified as African-American. .    BUN/Creatinine Ratio 28 (H) 6 - 22 (calc)   Sodium 137 135 - 146 mmol/L   Potassium 4.1 3.5 - 5.3 mmol/L   Chloride 99 98 - 110 mmol/L   CO2 26 20 - 32 mmol/L   Calcium 9.6 8.6 - 10.4 mg/dL   Total Protein 6.7 6.1 - 8.1 g/dL   Albumin 4.3 3.6 - 5.1 g/dL   Globulin 2.4 1.9 - 3.7 g/dL (calc)   AG Ratio 1.8 1.0 - 2.5 (calc)   Total Bilirubin 0.6 0.2 - 1.2 mg/dL   Alkaline phosphatase (APISO) 92 37 - 153 U/L   AST 20 10 - 35 U/L   ALT 15 6 - 29 U/L   CBC with Differential/Platelet     Status: None   Collection Time: 09/10/19  2:56 PM  Result Value Ref Range   WBC 5.8 3.8 - 10.8 Thousand/uL   RBC 4.60 3.80 - 5.10 Million/uL   Hemoglobin 13.4 11.7 - 15.5 g/dL   HCT 40.2 35.0 - 45.0 %   MCV 87.4 80.0 - 100.0 fL   MCH 29.1 27.0 - 33.0 pg   MCHC 33.3 32.0 - 36.0 g/dL   RDW 13.1 11.0 - 15.0 %   Platelets 250 140 - 400 Thousand/uL   MPV 10.6 7.5 - 12.5 fL   Neutro Abs 4,077 1,500 - 7,800 cells/uL   Lymphs Abs 1,235 850 - 3,900 cells/uL   Absolute Monocytes 319 200 - 950 cells/uL   Eosinophils Absolute 139 15 - 500 cells/uL   Basophils Absolute 29 0 - 200 cells/uL   Neutrophils Relative % 70.3 %   Total Lymphocyte 21.3 %   Monocytes Relative 5.5 %   Eosinophils Relative 2.4 %   Basophils Relative 0.5 %  Lipid panel     Status: Abnormal   Collection Time: 09/10/19  2:56  PM  Result Value Ref Range   Cholesterol 241 (H) <200 mg/dL   HDL 82 > OR = 50 mg/dL   Triglycerides 106 <150 mg/dL   LDL Cholesterol (Calc) 138 (H) mg/dL (calc)    Comment: Reference range: <100 . Desirable range <100 mg/dL for primary prevention;   <70 mg/dL for patients with CHD or diabetic patients  with > or = 2 CHD risk factors. Marland Kitchen LDL-C is now calculated using the Martin-Hopkins  calculation, which is a validated novel method providing  better accuracy than the Friedewald equation in the  estimation of LDL-C.  Cresenciano Genre et al. Annamaria Helling. [id number]): 2061-2068  (http://education.[employer].com/faq/FAQ164)    Total CHOL/HDL Ratio 2.9 <5.0 (calc)   Non-HDL Cholesterol (Calc) 159 (H) <130 mg/dL (calc)    Comment: For patients with diabetes plus 1 major ASCVD risk  factor, treating to a non-HDL-C goal of <100 mg/dL  (LDL-C of <70 mg/dL) is considered a therapeutic  option.   TSH     Status: None   Collection Time: 09/10/19  2:56 PM  Result Value Ref Range   TSH 2.63 0.40 - 4.50 mIU/L   Objective  Body mass index is 25.53 kg/m. Wt Readings  from Last 3 Encounters:  09/10/19 126 lb 6.4 oz (57.3 kg)  01/27/19 122 lb 6.4 oz (55.5 kg)  10/09/18 126 lb (57.2 kg)   Temp Readings from Last 3 Encounters:  09/10/19 98.1 F (36.7 C) (Oral)  01/27/19 98.1 F (36.7 C) (Oral)  10/10/18 98 F (36.7 C) (Oral)   BP Readings from Last 3 Encounters:  09/10/19 (!) 152/90  01/27/19 115/80  10/10/18 131/76   Pulse Readings from Last 3 Encounters:  09/10/19 78  01/27/19 88  10/10/18 80    Physical Exam Vitals signs and nursing note reviewed.  Constitutional:      Appearance: Normal appearance. She is well-developed and well-groomed.  HENT:     Head: Normocephalic and atraumatic.     Comments: +mask on   Eyes:     Conjunctiva/sclera: Conjunctivae normal.     Pupils: Pupils are equal, round, and reactive to light.  Cardiovascular:     Rate and Rhythm: Normal rate and regular rhythm.     Heart sounds: Normal heart sounds. No murmur.  Pulmonary:     Effort: Pulmonary effort is normal.     Breath sounds: Normal breath sounds.  Musculoskeletal:     Right hip: She exhibits tenderness.     Thoracic back: She exhibits tenderness.  Skin:    General: Skin is warm and dry.  Neurological:     General: No focal deficit present.     Mental Status: She is alert and oriented to person, place, and time. Mental status is at baseline.     Gait: Gait abnormal.     Comments: Slowed gait today w/o walking device and limping    Psychiatric:        Attention and Perception: Attention and perception normal.        Mood and Affect: Mood and affect normal.        Speech: Speech normal.        Behavior: Behavior normal. Behavior is cooperative.        Thought Content: Thought content normal.        Cognition and Memory: Cognition and memory normal.        Judgment: Judgment normal.     Assessment  Plan  Essential hypertension - Plan: Comprehensive metabolic panel, CBC with  Differential/Platelet, Lipid panel Increase lis to 30 mg qd f/u  12/2019   Esophagitis - Plan: omeprazole (PRILOSEC) 40 MG capsule rec take if continues to have heartburn since last egd 05/17/14 rec GI referral in future  Gastroesophageal reflux disease  Right hip pain - Plan: DG Hip Unilat W OR W/O Pelvis 2-3 Views Right, Xray mid back today Ambulatory referral to Physical Medicine Rehab Dr. Sharlet Salina, Ambulatory referral to Orthopedic Surgery Dr. Loralyn Freshwater  Mid back pain - Plan: DG Thoracic Spine 2 View, Ambulatory referral to Physical Medicine Rehab Dr. Sharlet Salina  Pt prefers to stay locally instead of [facility] network advised to cancel ortho [facility] and pm&R referrals by [facility] [place] [facility]  Grief/Anxiety -consider low dose celexa 10 mg never tried Ativan due to her daughters history  Will sent to pharmacy if pt wants to get and given info to read  Chest pain, unspecified type Palpitations -refer to cards r/o cardiac etiology may need holter  Control anxiety and GERD  Cataract of both eyes, unspecified cataract type Pending surgery 10/2019 [facility] in [place]   HM Had flu shot9/14/20 prevnar had 12/29/17, pna 23 10/10/18  Tdap had 12/09/11 Declines shingrix, hep B vaccine prev.  zostervax had 2012  Never smoker   s/p right breast lumpectomy 2009 and radiation only  -mammogram 05/31/19 negative  Pap out of age window no h/o abnormal pap  DEXA had 04/28/13 h/o osteopenia wait on repeat in future offereddisc'ed again 09/10/19 will hold for now Colonoscopy copy obtained reviewed 01/10/09 grade 1 IH and diverticulosis -colonoscopy due at f/uwill hold for now with other multiple appts  rec healthy diet and exercise  Provider: Dr. Olivia Mackie McLean-Scocuzza-Internal Medicine

## 2019-09-10 NOTE — Patient Instructions (Addendum)
[name] , Thank you for taking time to come for your Medicare Wellness Visit. I appreciate your ongoing commitment to your health goals. Please review the following plan we discussed and let me know if I can assist you in the future.   These are the goals we discussed: Goals      Patient Stated   . Maintain Healthy Lifestyle (pt-stated)     Stay active Eat a healthy diet        This is a list of the screening recommended for you and due dates:  Health Maintenance  Topic Date Due  . DEXA scan (bone density measurement)  11/25/2011  . Flu Shot  02/23/2020*  . Colon Cancer Screening  09/09/2020*  . Mammogram  05/30/2021  . Tetanus Vaccine  12/08/2021  .  Hepatitis C: One time screening is recommended by Center for Disease Control  (CDC) for  adults born from [birth year] through 1965.   Completed  . Pneumonia vaccines  Completed  *Topic was postponed. The date shown is not the original due date.    Bone Density Test The bone density test uses a special type of X-ray to measure the amount of calcium and other minerals in your bones. It can measure bone density in the hip and the spine. The test procedure is similar to having a regular X-ray. This test may also be called:  Bone densitometry.  Bone mineral density test.  Dual-energy X-ray absorptiometry (DEXA). You may have this test to:  Diagnose a condition that causes weak or thin bones (osteoporosis).  Screen you for osteoporosis.  Predict your risk for a broken bone (fracture).  Determine how well your osteoporosis treatment is working. Tell a health care provider about:  Any allergies you have.  All medicines you are taking, including vitamins, herbs, eye drops, creams, and over-the-counter medicines.  Any problems you or family members have had with anesthetic medicines.  Any blood disorders you have.  Any surgeries you have had.  Any medical conditions you have.  Whether you are pregnant or may be pregnant.   Any medical tests you have had within the past 14 days that used contrast material. What are the risks? Generally, this is a safe procedure. However, it does expose you to a small amount of radiation, which can slightly increase your cancer risk. What happens before the procedure?  Do not take any calcium supplements starting 24 hours before your test.  Remove all metal jewelry, eyeglasses, dental appliances, and any other metal objects. What happens during the procedure?   You will lie down on an exam table. There will be an X-ray generator below you and an imaging device above you.  Other devices, such as boxes or braces, may be used to position your body properly for the scan.  The machine will slowly scan your body. You will need to keep still.  The images will show up on a screen in the room. Images will be examined by a specialist after your test is done. The procedure may vary among health care providers and hospitals. What happens after the procedure?  It is up to you to get your test results. Ask your health care provider, or the department that is doing the test, when your results will be ready. Summary  A bone density test is an imaging test that uses a type of X-ray to measure the amount of calcium and other minerals in your bones.  The test may be used to diagnose or  screen you for a condition that causes weak or thin bones (osteoporosis), predict your risk for a broken bone (fracture), or determine how well your osteoporosis treatment is working.  Do not take any calcium supplements starting 24 hours before your test.  Ask your health care provider, or the department that is doing the test, when your results will be ready. This information is not intended to replace advice given to you by your health care provider. Make sure you discuss any questions you have with your health care provider. Document Released: 12/03/2004 Document Revised: 11/27/2017 Document Reviewed:  09/15/2017 Elsevier Patient Education  2020 [employer].

## 2019-09-10 NOTE — Patient Instructions (Addendum)
10/12/2019 Office Visit Orthopedic Surgery Laqueta Jean, [place]  [address], [place] [address]  [phone]  [phone] (Fax)    10/29/2019 Office Visit Physical Medicine and Rehabilitation Raymondo Band, MD  [address]  [place] [id number] [place], [place] [address]  [phone]  [phone] (Fax)      Call to cancel appts  Citalopram tablets What is this medicine? CITALOPRAM (sye TAL oh pram) is a medicine for depression. This medicine may be used for other purposes; ask your health care provider or pharmacist if you have questions. COMMON BRAND NAME(S): Celexa What should I tell my health care provider before I take this medicine? They need to know if you have any of these conditions:  bleeding disorders  bipolar disorder or a family history of bipolar disorder  glaucoma  heart disease  history of irregular heartbeat  kidney disease  liver disease  low levels of magnesium or potassium in the blood  receiving electroconvulsive therapy  seizures  suicidal thoughts, plans, or attempt; a previous suicide attempt by you or a family member  take medicines that treat or prevent blood clots  thyroid disease  an unusual or allergic reaction to citalopram, escitalopram, other medicines, foods, dyes, or preservatives  pregnant or trying to become pregnant  breast-feeding How should I use this medicine? Take this medicine by mouth with a glass of water. Follow the directions on the prescription label. You can take it with or without food. Take your medicine at regular intervals. Do not take your medicine more often than directed. Do not stop taking this medicine suddenly except upon the advice of your doctor. Stopping this medicine too quickly may cause serious side effects or your condition may worsen. A special MedGuide will be given to you by the pharmacist with each prescription and refill. Be sure to  read this information carefully each time. Talk to your pediatrician regarding the use of this medicine in children. Special care may be needed. Patients over 38 years old may have a stronger reaction and need a smaller dose. Overdosage: If you think you have taken too much of this medicine contact a poison control center or emergency room at once. NOTE: This medicine is only for you. Do not share this medicine with others. What if I miss a dose? If you miss a dose, take it as soon as you can. If it is almost time for your next dose, take only that dose. Do not take double or extra doses. What may interact with this medicine? Do not take this medicine with any of the following medications:  certain medicines for fungal infections like fluconazole, itraconazole, ketoconazole, posaconazole, voriconazole  cisapride  dronedarone  escitalopram  linezolid  MAOIs like Carbex, Eldepryl, Marplan, Nardil, and Parnate  methylene blue (injected into a vein)  pimozide  thioridazine This medicine may also interact with the following medications:  alcohol  amphetamines  aspirin and aspirin-like medicines  carbamazepine  certain medicines for depression, anxiety, or psychotic disturbances  certain medicines for infections like chloroquine, clarithromycin, erythromycin, furazolidone, isoniazid, pentamidine  certain medicines for migraine headaches like almotriptan, eletriptan, frovatriptan, naratriptan, rizatriptan, sumatriptan, zolmitriptan  certain medicines for sleep  certain medicines that treat or prevent blood clots like dalteparin, enoxaparin, warfarin  cimetidine  diuretics  dofetilide  fentanyl  lithium  methadone  metoprolol  NSAIDs, medicines for pain and inflammation, like ibuprofen or naproxen  omeprazole  other medicines that prolong the  QT interval (cause an abnormal heart rhythm)  procarbazine  rasagiline  supplements like St. John's wort, kava  kava, valerian  tramadol  tryptophan  ziprasidone This list may not describe all possible interactions. Give your health care provider a list of all the medicines, herbs, non-prescription drugs, or dietary supplements you use. Also tell them if you smoke, drink alcohol, or use illegal drugs. Some items may interact with your medicine. What should I watch for while using this medicine? Tell your doctor if your symptoms do not get better or if they get worse. Visit your doctor or health care professional for regular checks on your progress. Because it may take several weeks to see the full effects of this medicine, it is important to continue your treatment as prescribed by your doctor. Patients and their families should watch out for new or worsening thoughts of suicide or depression. Also watch out for sudden changes in feelings such as feeling anxious, agitated, panicky, irritable, hostile, aggressive, impulsive, severely restless, overly excited and hyperactive, or not being able to sleep. If this happens, especially at the beginning of treatment or after a change in dose, call your health care professional. Dennis Bast may get drowsy or dizzy. Do not drive, use machinery, or do anything that needs mental alertness until you know how this medicine affects you. Do not stand or sit up quickly, especially if you are an older patient. This reduces the risk of dizzy or fainting spells. Alcohol may interfere with the effect of this medicine. Avoid alcoholic drinks. Your mouth may get dry. Chewing sugarless gum or sucking hard candy, and drinking plenty of water will help. Contact your doctor if the problem does not go away or is severe. What side effects may I notice from receiving this medicine? Side effects that you should report to your doctor or health care professional as soon as possible:  allergic reactions like skin rash, itching or hives, swelling of the face, lips, or tongue  anxious  black, tarry  stools  breathing problems  changes in vision  chest pain  confusion  elevated mood, decreased need for sleep, racing thoughts, impulsive behavior  eye pain  fast, irregular heartbeat  feeling faint or lightheaded, falls  feeling agitated, angry, or irritable  hallucination, loss of contact with reality  loss of balance or coordination  loss of memory  painful or prolonged erections  restlessness, pacing, inability to keep still  seizures  stiff muscles  suicidal thoughts or other mood changes  trouble sleeping  unusual bleeding or bruising  unusually weak or tired  vomiting Side effects that usually do not require medical attention (report to your doctor or health care professional if they continue or are bothersome):  change in appetite or weight  change in sex drive or performance  dizziness  headache  increased sweating  indigestion, nausea  tremors This list may not describe all possible side effects. Call your doctor for medical advice about side effects. You may report side effects to FDA at 1-800-FDA-1088. Where should I keep my medicine? Keep out of reach of children. Store at room temperature between 15 and 30 degrees C (59 and 86 degrees F). Throw away any unused medicine after the expiration date. NOTE: This sheet is a summary. It may not cover all possible information. If you have questions about this medicine, talk to your doctor, pharmacist, or health care provider.  2020 Elsevier/Gold Standard (2018-11-02 09:05:36) Citalopram tablets What is this medicine? CITALOPRAM (sye TAL oh pram)  is a medicine for depression. This medicine may be used for other purposes; ask your health care provider or pharmacist if you have questions. COMMON BRAND NAME(S): Celexa What should I tell my health care provider before I take this medicine? They need to know if you have any of these conditions:  bleeding disorders  bipolar disorder or a family  history of bipolar disorder  glaucoma  heart disease  history of irregular heartbeat  kidney disease  liver disease  low levels of magnesium or potassium in the blood  receiving electroconvulsive therapy  seizures  suicidal thoughts, plans, or attempt; a previous suicide attempt by you or a family member  take medicines that treat or prevent blood clots  thyroid disease  an unusual or allergic reaction to citalopram, escitalopram, other medicines, foods, dyes, or preservatives  pregnant or trying to become pregnant  breast-feeding How should I use this medicine? Take this medicine by mouth with a glass of water. Follow the directions on the prescription label. You can take it with or without food. Take your medicine at regular intervals. Do not take your medicine more often than directed. Do not stop taking this medicine suddenly except upon the advice of your doctor. Stopping this medicine too quickly may cause serious side effects or your condition may worsen. A special MedGuide will be given to you by the pharmacist with each prescription and refill. Be sure to read this information carefully each time. Talk to your pediatrician regarding the use of this medicine in children. Special care may be needed. Patients over 58 years old may have a stronger reaction and need a smaller dose. Overdosage: If you think you have taken too much of this medicine contact a poison control center or emergency room at once. NOTE: This medicine is only for you. Do not share this medicine with others. What if I miss a dose? If you miss a dose, take it as soon as you can. If it is almost time for your next dose, take only that dose. Do not take double or extra doses. What may interact with this medicine? Do not take this medicine with any of the following medications:  certain medicines for fungal infections like fluconazole, itraconazole, ketoconazole, posaconazole,  voriconazole  cisapride  dronedarone  escitalopram  linezolid  MAOIs like Carbex, Eldepryl, Marplan, Nardil, and Parnate  methylene blue (injected into a vein)  pimozide  thioridazine This medicine may also interact with the following medications:  alcohol  amphetamines  aspirin and aspirin-like medicines  carbamazepine  certain medicines for depression, anxiety, or psychotic disturbances  certain medicines for infections like chloroquine, clarithromycin, erythromycin, furazolidone, isoniazid, pentamidine  certain medicines for migraine headaches like almotriptan, eletriptan, frovatriptan, naratriptan, rizatriptan, sumatriptan, zolmitriptan  certain medicines for sleep  certain medicines that treat or prevent blood clots like dalteparin, enoxaparin, warfarin  cimetidine  diuretics  dofetilide  fentanyl  lithium  methadone  metoprolol  NSAIDs, medicines for pain and inflammation, like ibuprofen or naproxen  omeprazole  other medicines that prolong the QT interval (cause an abnormal heart rhythm)  procarbazine  rasagiline  supplements like St. John's wort, kava kava, valerian  tramadol  tryptophan  ziprasidone This list may not describe all possible interactions. Give your health care provider a list of all the medicines, herbs, non-prescription drugs, or dietary supplements you use. Also tell them if you smoke, drink alcohol, or use illegal drugs. Some items may interact with your medicine. What should I watch for while using this  medicine? Tell your doctor if your symptoms do not get better or if they get worse. Visit your doctor or health care professional for regular checks on your progress. Because it may take several weeks to see the full effects of this medicine, it is important to continue your treatment as prescribed by your doctor. Patients and their families should watch out for new or worsening thoughts of suicide or depression. Also  watch out for sudden changes in feelings such as feeling anxious, agitated, panicky, irritable, hostile, aggressive, impulsive, severely restless, overly excited and hyperactive, or not being able to sleep. If this happens, especially at the beginning of treatment or after a change in dose, call your health care professional. Dennis Bast may get drowsy or dizzy. Do not drive, use machinery, or do anything that needs mental alertness until you know how this medicine affects you. Do not stand or sit up quickly, especially if you are an older patient. This reduces the risk of dizzy or fainting spells. Alcohol may interfere with the effect of this medicine. Avoid alcoholic drinks. Your mouth may get dry. Chewing sugarless gum or sucking hard candy, and drinking plenty of water will help. Contact your doctor if the problem does not go away or is severe. What side effects may I notice from receiving this medicine? Side effects that you should report to your doctor or health care professional as soon as possible:  allergic reactions like skin rash, itching or hives, swelling of the face, lips, or tongue  anxious  black, tarry stools  breathing problems  changes in vision  chest pain  confusion  elevated mood, decreased need for sleep, racing thoughts, impulsive behavior  eye pain  fast, irregular heartbeat  feeling faint or lightheaded, falls  feeling agitated, angry, or irritable  hallucination, loss of contact with reality  loss of balance or coordination  loss of memory  painful or prolonged erections  restlessness, pacing, inability to keep still  seizures  stiff muscles  suicidal thoughts or other mood changes  trouble sleeping  unusual bleeding or bruising  unusually weak or tired  vomiting Side effects that usually do not require medical attention (report to your doctor or health care professional if they continue or are bothersome):  change in appetite or  weight  change in sex drive or performance  dizziness  headache  increased sweating  indigestion, nausea  tremors This list may not describe all possible side effects. Call your doctor for medical advice about side effects. You may report side effects to FDA at 1-800-FDA-1088. Where should I keep my medicine? Keep out of reach of children. Store at room temperature between 15 and 30 degrees C (59 and 86 degrees F). Throw away any unused medicine after the expiration date. NOTE: This sheet is a summary. It may not cover all possible information. If you have questions about this medicine, talk to your doctor, pharmacist, or health care provider.  2020 Elsevier/Gold Standard (2018-11-02 09:05:36)

## 2019-09-10 NOTE — Progress Notes (Addendum)
Subjective:   Maria Spencer is a 73 y.o. female who presents for Medicare Annual (Subsequent) preventive examination.  Review of Systems:  No ROS.  Medicare Wellness Virtual Visit.  Visual/audio telehealth visit, UTA vital signs.   See social history for additional risk factors.   Cardiac Risk Factors include: advanced age (>39men, >85 women);hypertension     Objective:     Vitals: There were no vitals taken for this visit.  There is no height or weight on file to calculate BMI.  Advanced Directives 09/10/2019 10/09/2018 10/09/2018 10/09/2018 10/09/2018 09/08/2018 05/28/2018  Does Patient Have a Medical Advance Directive? Yes Yes - Yes No Yes -  Type of Advance Directive [place];Living will - Living will;Healthcare Power of Attorney Living will;Healthcare Power of [place];Living will [place];Living will  Does patient want to make changes to medical advance directive? No - Patient declined No - Patient declined - - - No - Patient declined No - Patient declined  Copy of [place] in Chart? No - copy requested - No - copy requested No - copy requested - No - copy requested No - copy requested    Tobacco Social History   Tobacco Use  Smoking Status Never Smoker  Smokeless Tobacco Never Used     Counseling given: Not Answered   Clinical Intake:  Pre-visit preparation completed: Yes        Diabetes: No  How often do you need to have someone help you when you read instructions, pamphlets, or other written materials from your doctor or pharmacy?: 1 - Never  Interpreter Needed?: No     Past Medical History:  Diagnosis Date  . Arthritis    neck, back, left knee;   . Breast cancer [facility]) 2009   right lumpectomy s/p radiation   . Chicken pox   . Chronic UTI    established with urology   . Diverticular disease    -osis and -itis   . Hormone disorder   . Hyperlipidemia   .  Hypertension    controlled well with medication;   . Personal history of radiation therapy 2009   F/U right breast cancer  . Thyroid disease    hypothyroidism    Past Surgical History:  Procedure Laterality Date  . BONE GRAFT HIP ILIAC CREST     + cage left hip 10/23/17   . BREAST BIOPSY Left 2009   clip,benign  . BREAST BIOPSY Right 2009   +  . BREAST LUMPECTOMY Right 2009   2009 lumpectomy   . INSERTION OF MESH N/A 05/29/2018   Procedure: INSERTION OF MESH;  Surgeon: Johnathan Hausen, MD;  Location: [facility];  Service: General;  Laterality: N/A;  . SHOULDER SURGERY     x2 surgeries both shoulders, right shoulder replacement last in 2012   . SPINE SURGERY     spinal 09/2017 h/o scoliosis   . TONSILLECTOMY     age 71 y.o.   . TOTAL SHOULDER REPLACEMENT    . VENTRAL HERNIA REPAIR N/A 05/29/2018   Procedure: LAPAROSCOPIC VENTRAL / [place];  Surgeon: Johnathan Hausen, MD;  Location: [facility];  Service: General;  Laterality: N/A;   Family History  Problem Relation Age of Onset  . Arthritis Mother   . Heart disease Mother   . Hyperlipidemia Mother   . Hypertension Mother   . Arthritis Father   . Diabetes Father   . Cancer Father  colon  . Arthritis Sister   . Hyperlipidemia Sister   . Hypertension Sister   . Cancer Brother        lung, smoker  . Mental retardation Brother   . Drug abuse Daughter   . Arthritis Sister   . Hyperlipidemia Sister   . Bladder Cancer Neg Hx   . Kidney cancer Neg Hx    Social History   Socioeconomic History  . Marital status: Widowed    Spouse name: Not on file  . Number of children: Not on file  . Years of education: Not on file  . Highest education level: Not on file  Occupational History  . Not on file  Social Needs  . Financial resource strain: Not hard at all  . Food insecurity    Worry: Never true    Inability: Never true  . Transportation needs    Medical: No    Non-medical: No  Tobacco Use  . Smoking status:  Never Smoker  . Smokeless tobacco: Never Used  Substance and Sexual Activity  . Alcohol use: Not Currently    Alcohol/week: 1.0 standard drinks    Types: 1 Glasses of wine per week    Comment: nightly  . Drug use: No  . Sexual activity: Not on file  Lifestyle  . Physical activity    Days per week: 5 days    Minutes per session: 30 min  . Stress: Rather much  Relationships  . Social Herbalist on phone: Not on file    Gets together: Not on file    Attends religious service: Not on file    Active member of club or organization: Not on file    Attends meetings of clubs or organizations: Not on file    Relationship status: Widowed  Other Topics Concern  . Not on file  Social History Narrative   College grad   Married husband died 07/27/19    Wears seatbelt and safe in relationship    3 kids       [place]    Outpatient Encounter Medications as of 09/10/2019  Medication Sig  . aspirin EC 81 MG tablet Take 1 tablet (81 mg total) by mouth daily.  Marland Kitchen atorvastatin (LIPITOR) 10 MG tablet Take 1 tablet (10 mg total) by mouth daily at 6 PM. Note 10 mg no need to cut in 1/2 as before you have 20 mg taking 1/2 pill =10 mg  . Cholecalciferol (VITAMIN D3 PO) Take 1 capsule by mouth daily.  . fluticasone (FLONASE) 50 MCG/ACT nasal spray Place 1 spray into both nostrils as needed for allergies or rhinitis.  Marland Kitchen levothyroxine (SYNTHROID) 88 MCG tablet Take 1 tablet (88 mcg total) by mouth daily before breakfast. 30 minutes  . lisinopril (PRINIVIL,ZESTRIL) 20 MG tablet Take 20 mg by mouth daily.  . Multiple Vitamin (MULTI-VITAMINS) TABS Take 1 tablet by mouth daily.   Marland Kitchen zolpidem (AMBIEN) 5 MG tablet Take 1 tablet (5 mg total) by mouth at bedtime as needed for sleep.  Marland Kitchen LORazepam (ATIVAN) 0.5 MG tablet Take 0.5-1 tablets (0.25-0.5 mg total) by mouth 2 (two) times daily as needed for anxiety. (Patient not taking: Reported on 09/10/2019)  . neomycin-polymyxin-hydrocortisone  (CORTISPORIN) OTIC solution Place 4 drops into the right ear 4 (four) times daily. X 1 week (Patient not taking: Reported on 09/10/2019)  . [DISCONTINUED] amLODipine (NORVASC) 5 MG tablet Take 1 tablet (5 mg total) by mouth daily.  . [DISCONTINUED] ciprofloxacin (  CIPRO) 500 MG tablet Take 1 tablet (500 mg total) by mouth 2 (two) times daily. With food  . [DISCONTINUED] sertraline (ZOLOFT) 50 MG tablet Take 1 tablet (50 mg total) by mouth daily. In am   No facility-administered encounter medications on file as of 09/10/2019.     Activities of Daily Living In your present state of health, do you have any difficulty performing the following activities: 09/10/2019 10/09/2018  Hearing? N N  Vision? Y N  Comment She has cataract surgery scheduled -  Difficulty concentrating or making decisions? N N  Walking or climbing stairs? Y N  Dressing or bathing? N N  Doing errands, shopping? N N  Preparing Food and eating ? N -  Using the Toilet? N -  In the past six months, have you accidently leaked urine? N -  Do you have problems with loss of bowel control? N -  Managing your Medications? N -  Managing your Finances? N -  Housekeeping or managing your Housekeeping? N -  Some recent data might be hidden    Patient Care Team: McLean-Scocuzza, Nino Glow, MD as PCP - General (Internal Medicine) Johnathan Hausen, MD as Consulting Physician (General Surgery) Kaleen Mask, NP as Nurse Practitioner (Neurosurgery) [facility], Sonda Rumble, MD as Consulting Physician (Neurosurgery) Earl Many, MD as Consulting Physician (Urology)    Assessment:   This is a routine wellness examination for Keishana.  I connected with patient 09/10/19 at  9:30 AM EDT by an audio enabled telemedicine application and verified that I am speaking with the correct person using two identifiers. Patient stated full name and DOB. Patient gave permission to continue with virtual visit. Patient's location was at home and Nurse's  location was at [place] office.   Health Maintenance Due: -Dexa Scan- ordered but declined for now 09/10/2019  -Colonoscopy- postponed per patient preference Update all pending maintenance due as appropriate.   See completed HM at the end of note.   Eye: Visual acuity not assessed. Virtual visit. Wears corrective lenses. Followed by their ophthalmologist every 12 months. Cataract surgery scheduled.   Dental: Visits every 6 months.    Hearing: Demonstrates normal hearing during visit.  Safety:  Patient feels safe at home- yes Patient does have smoke detectors at home- yes Patient does wear sunscreen or protective clothing when in direct sunlight - yes Patient does wear seat belt when in a moving vehicle - yes Patient drives- yes Adequate lighting in walkways free from debris- yes Grab bars and handrails used as appropriate- yes Ambulates with no assistive device Cell phone on person when ambulating outside of the home- yes  Social: Alcohol intake - no    Smoking history- never   Smokers in home? none Illicit drug use? none  Depression: PHQ 2 &9 complete. See screening below. Hospice bereavement counselor every other week.   Falls: See screening below.    Medication: Amlodipine removed  Covid-19: Precautions and sickness symptoms discussed. Wears mask, social distancing, hand hygiene as appropriate.   Activities of Daily Living Patient denies needing assistance with: household chores, feeding themselves, getting from bed to chair, getting to the toilet, bathing/showering, dressing, managing money, or preparing meals.   Memory: Patient is alert. Patient likes to read, and complete puzzles for brain stimulation.  BMI- discussed the importance of a healthy diet, water intake and the benefits of aerobic exercise.  Educational material provided.  Physical activity-  Walking, yoga  Diet: regular Water: good intake Caffeine: 2 cups of coffee  Other Providers  Patient Care Team: McLean-Scocuzza, Nino Glow, MD as PCP - General (Internal Medicine) Johnathan Hausen, MD as Consulting Physician (General Surgery) Kaleen Mask, NP as Nurse Practitioner (Neurosurgery) [facility], Sonda Rumble, MD as Consulting Physician (Neurosurgery) Earl Many, MD as Consulting Physician (Urology)  Exercise Activities and Dietary recommendations Current Exercise Habits: Home exercise routine, Type of exercise: yoga;walking, Intensity: Mild  Goals      Patient Stated   . Maintain Healthy Lifestyle (pt-stated)     Stay active Eat a healthy diet        Fall Risk Fall Risk  09/10/2019 09/08/2018 03/26/2018 12/29/2017  Falls in the past year? 0 No No No   Timed Get Up and Go performed: no, virtual visit  Depression Screen PHQ 2/9 Scores 09/10/2019 09/08/2018 03/26/2018 12/29/2017  PHQ - 2 Score 0 2 0 0  PHQ- 9 Score - 11 - -     Cognitive Function     6CIT Screen 09/08/2018  What Year? 0 points  What month? 0 points  What time? 0 points  Count back from 20 0 points  Months in reverse 0 points  Repeat phrase 0 points  Total Score 0    Immunization History  Administered Date(s) Administered  . Influenza Split 11/25/2005, 08/25/2012  . Influenza, High Dose Seasonal PF 08/13/2016, 08/02/2017, 08/07/2018, 08/09/2019  . Influenza,inj,Quad PF,6+ Mos 08/25/2014, 08/26/2015  . Influenza-Unspecified 08/25/2017  . Pneumococcal Conjugate-13 01/30/2015, 12/29/2017  . Pneumococcal Polysaccharide-23 11/26/2011, 12/09/2011, 10/10/2018  . Tdap 12/09/2011  . Zoster 11/26/2010   Screening Tests Health Maintenance  Topic Date Due  . DEXA SCAN  11/25/2011  . INFLUENZA VACCINE  02/23/2020 (Originally 06/26/2019)  . COLONOSCOPY  09/09/2020 (Originally 05/18/2019)  . MAMMOGRAM  05/30/2021  . TETANUS/TDAP  12/08/2021  . Hepatitis C Screening  Completed  . PNA vac Low Risk Adult  Completed      Plan:   Keep all routine maintenance appointments.   Follow up today  at 2:00 pm  Medicare Attestation I have personally reviewed: The patient's medical and social history Their use of alcohol, tobacco or illicit drugs Their current medications and supplements The patient's functional ability including ADLs,fall risks, home safety risks, cognitive, and hearing and visual impairment Diet and physical activities Evidence for depression   In addition, I have reviewed and discussed with patient certain preventive protocols, quality metrics, and best practice recommendations. A written personalized care plan for preventive services as well as general preventive health recommendations were provided to patient via mail.     Varney Biles, LPN  [id number]  Agree  TMS

## 2019-09-13 ENCOUNTER — Other Ambulatory Visit: Payer: Self-pay | Admitting: Internal Medicine

## 2019-09-13 ENCOUNTER — Ambulatory Visit
Admission: RE | Admit: 2019-09-13 | Discharge: 2019-09-13 | Disposition: A | Discharge status: Home / Self Care | Payer: Medicare Other | Source: Ambulatory Visit | Attending: Nurse Practitioner | Admitting: Nurse Practitioner

## 2019-09-13 ENCOUNTER — Encounter: Payer: Self-pay | Admitting: Internal Medicine

## 2019-09-13 ENCOUNTER — Other Ambulatory Visit: Payer: Self-pay

## 2019-09-13 DIAGNOSIS — M5416 Radiculopathy, lumbar region: Secondary | ICD-10-CM | POA: Insufficient documentation

## 2019-09-13 DIAGNOSIS — R002 Palpitations: Secondary | ICD-10-CM | POA: Insufficient documentation

## 2019-09-13 DIAGNOSIS — G8929 Other chronic pain: Secondary | ICD-10-CM | POA: Insufficient documentation

## 2019-09-13 DIAGNOSIS — F419 Anxiety disorder, unspecified: Secondary | ICD-10-CM | POA: Insufficient documentation

## 2019-09-13 DIAGNOSIS — R739 Hyperglycemia, unspecified: Secondary | ICD-10-CM

## 2019-09-13 DIAGNOSIS — M545 Low back pain: Secondary | ICD-10-CM | POA: Diagnosis not present

## 2019-09-13 DIAGNOSIS — M25551 Pain in right hip: Secondary | ICD-10-CM | POA: Insufficient documentation

## 2019-09-13 DIAGNOSIS — M549 Dorsalgia, unspecified: Secondary | ICD-10-CM | POA: Insufficient documentation

## 2019-09-13 DIAGNOSIS — H269 Unspecified cataract: Secondary | ICD-10-CM | POA: Insufficient documentation

## 2019-09-13 DIAGNOSIS — F4321 Adjustment disorder with depressed mood: Secondary | ICD-10-CM | POA: Insufficient documentation

## 2019-09-13 IMAGING — CT CT L SPINE W/O CM
3 of 4 series · 9 of 33 positions shown, 11 images · non-contrast
Comparison: CT abdomen and pelvis [DATE]

CLINICAL DATA: Worsening right low back pain radiating down the
right leg. History of lumbar fusion and breast cancer.

EXAM:
CT LUMBAR SPINE WITHOUT CONTRAST
TECHNIQUE: Multidetector CT imaging of the lumbar spine was performed without
intravenous contrast administration. Multiplanar CT image
reconstructions were also generated.

[Series 3: (person_name) · axial · 0.35mm/px · z∈[-1172,-1172]mm · 1 of 153 slices shown, 2 images]
[im 77/153  soft-tissue]
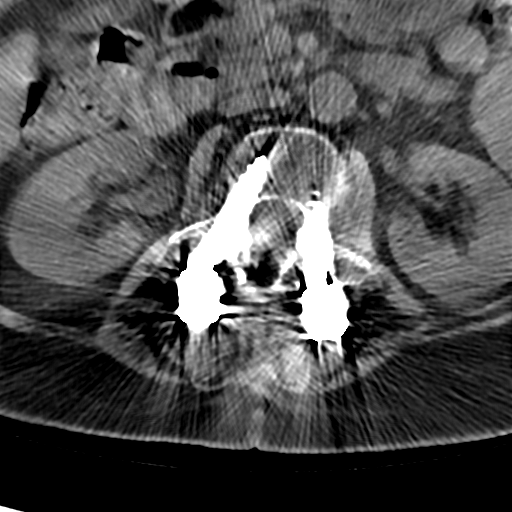
[im 77/153  bone]
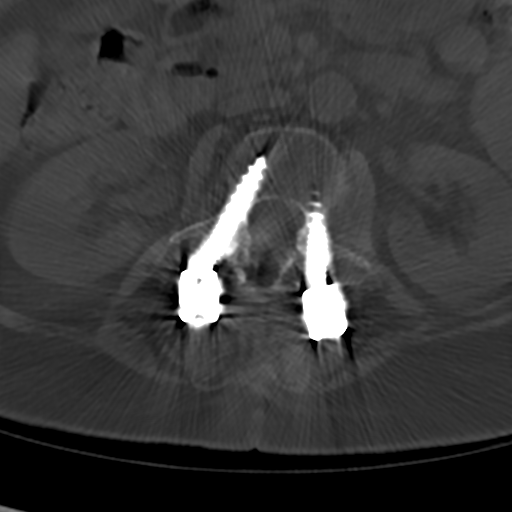

[Series 5: sag bone l-spine · sagittal · 0.29mm/px · 5 of 89 slices shown, 6 images]
[im 30/89  bone]
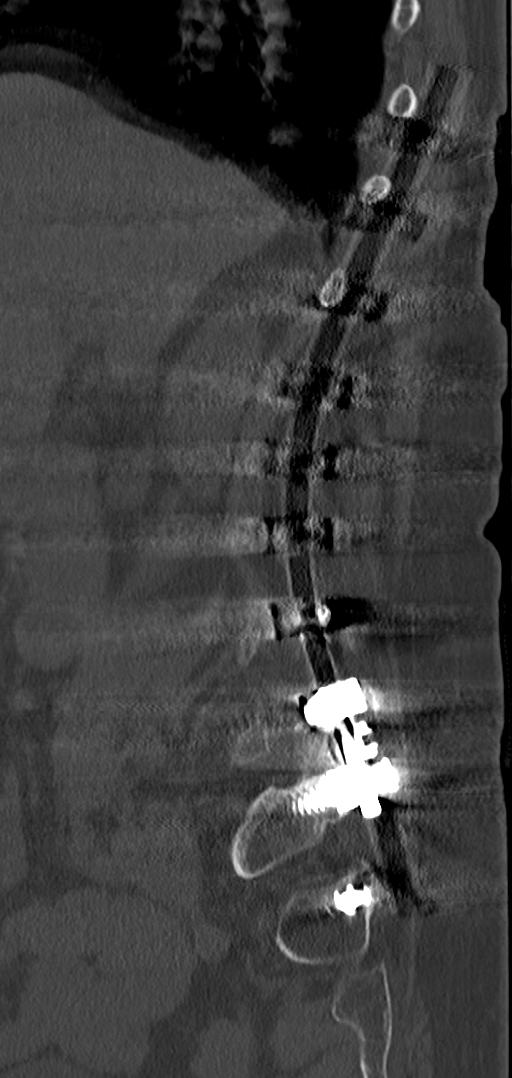
[im 37/89  bone]
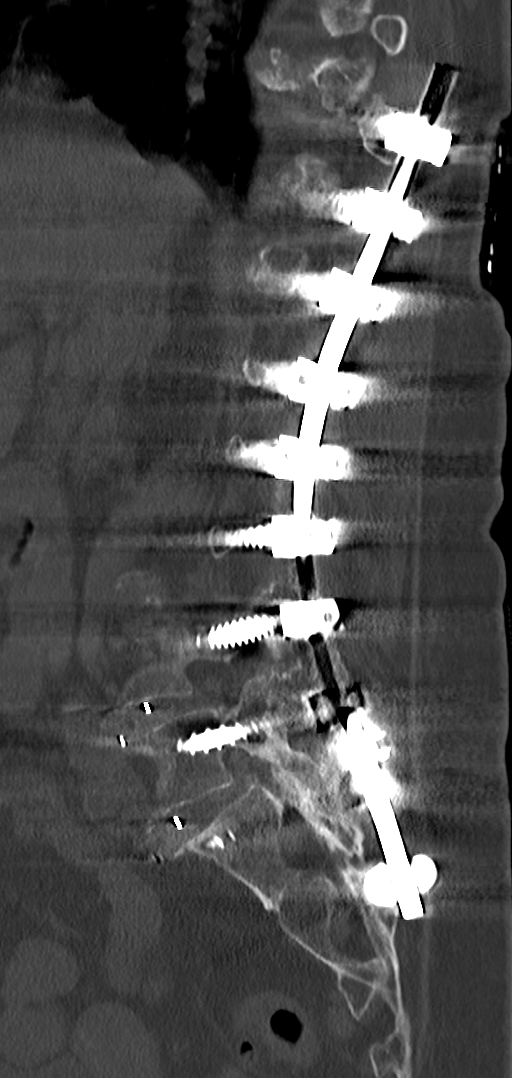
[im 45/89  soft-tissue]
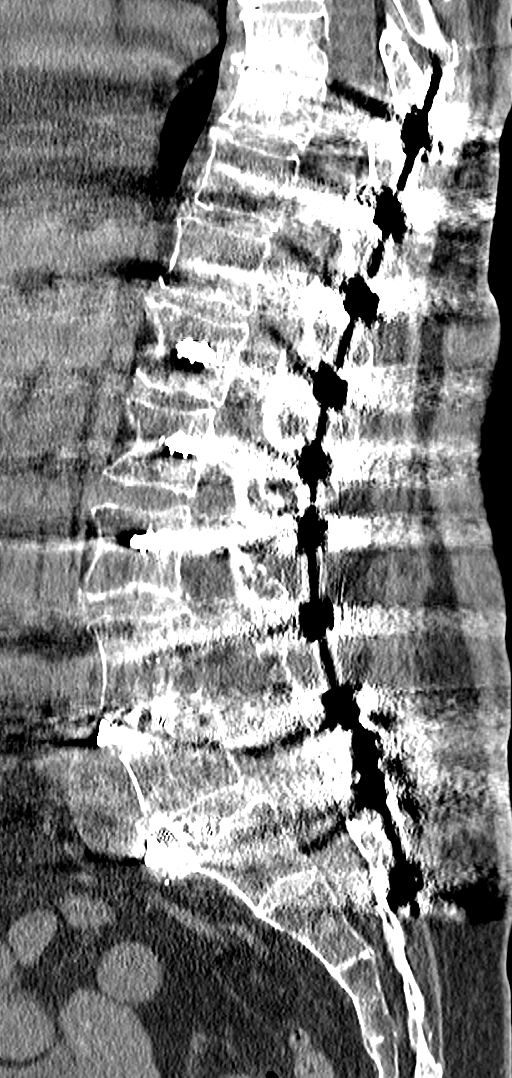
[im 45/89  bone]
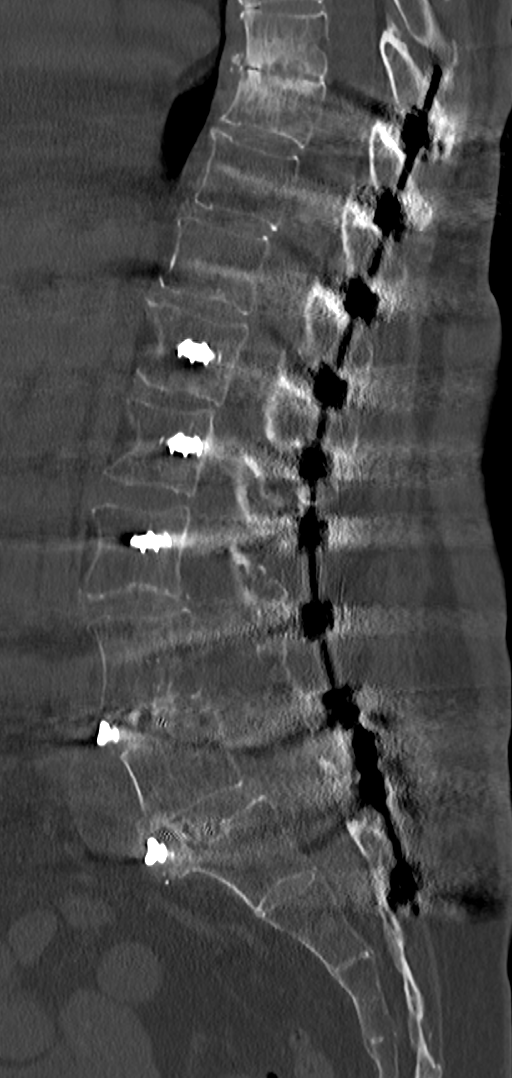
[im 52/89  bone]
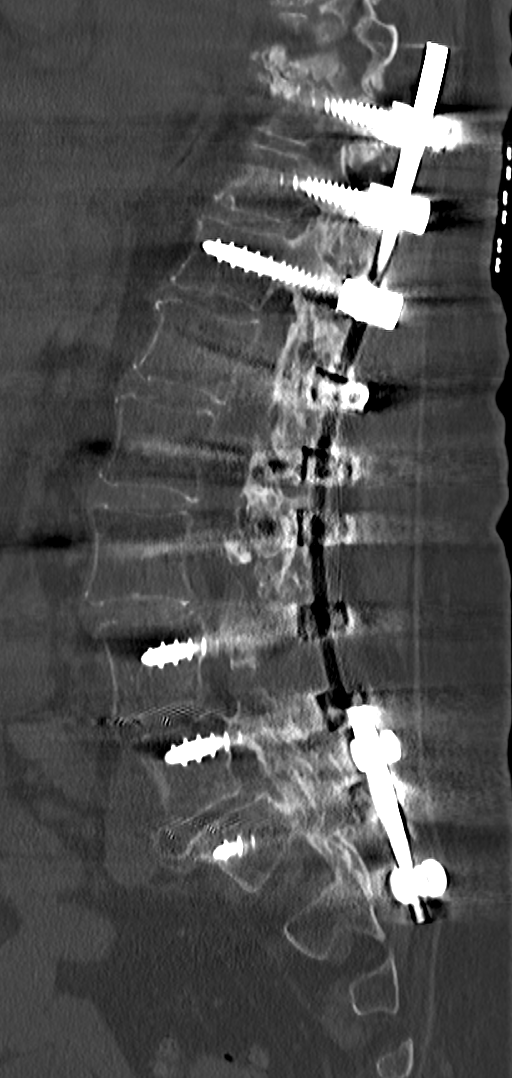
[im 59/89  bone]
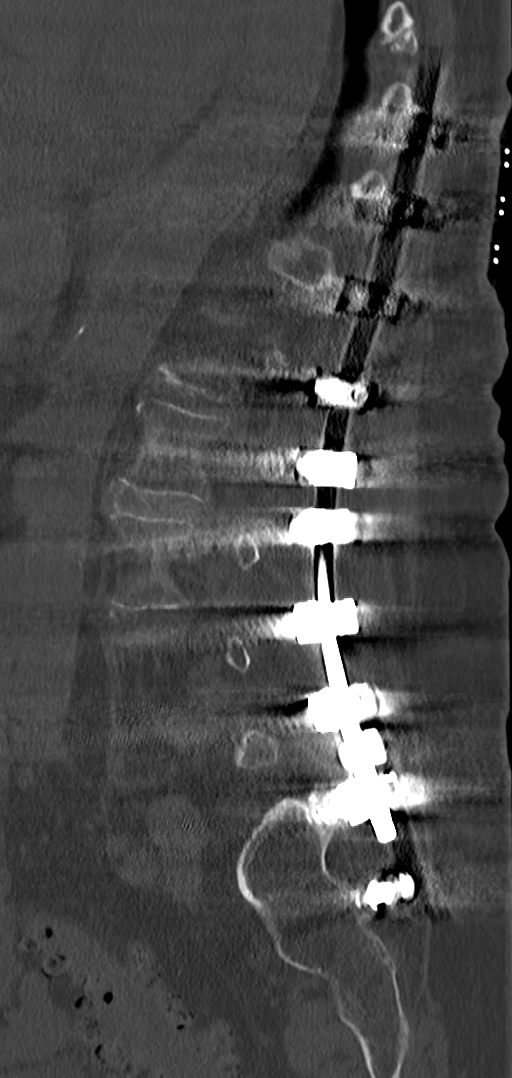

[Series 7: cor bone l-spine · coronal · 0.35mm/px · 3 of 73 slices shown]
[im 15/73  bone]
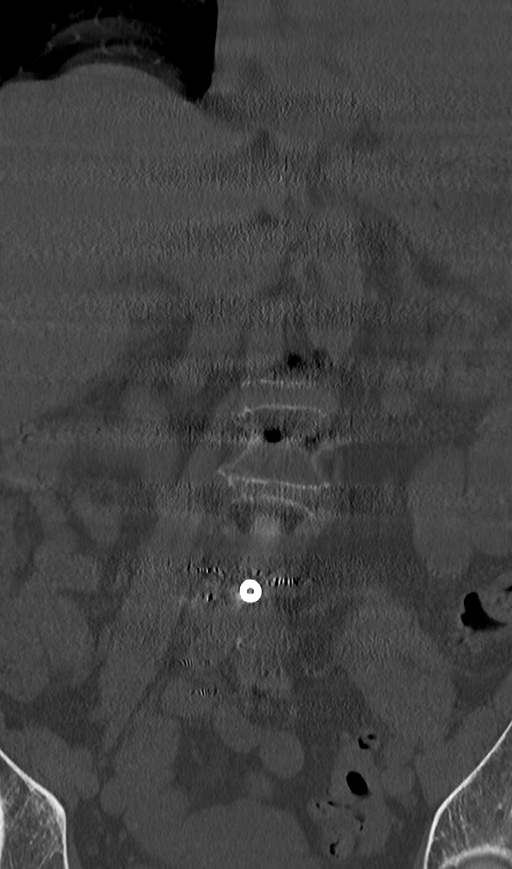
[im 29/73  bone]
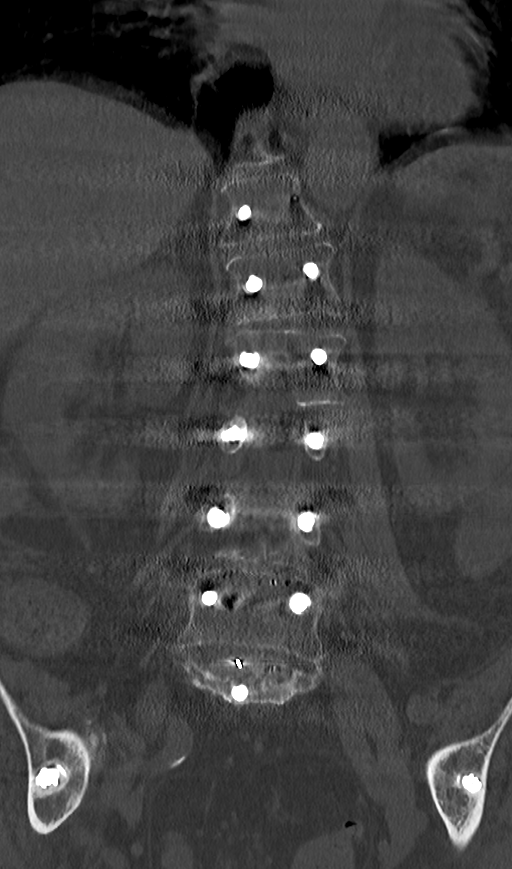
[im 44/73  bone]
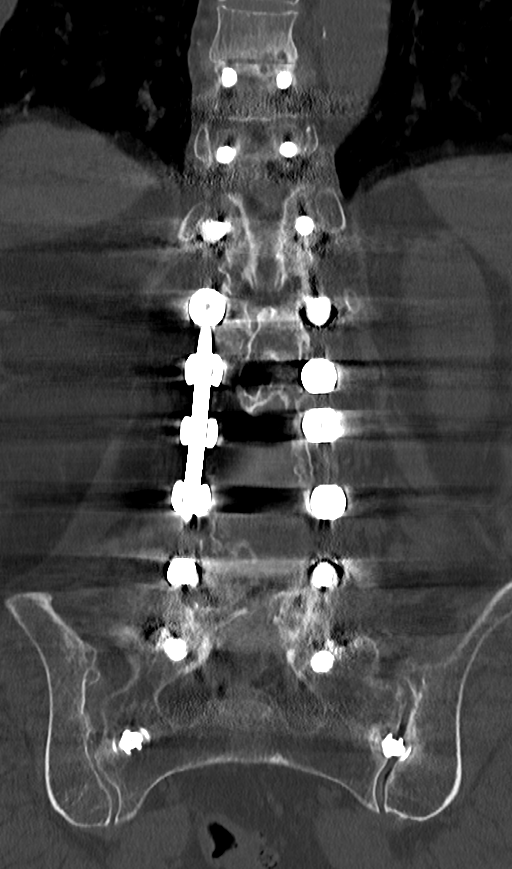

[9 of 33 positions shown; findings below may reference images not displayed]

FINDINGS: Segmentation: 5 lumbar type vertebrae.

Alignment: Mild lumbar dextroscoliosis crash that mild lumbar
levoscoliosis. Trace retrolisthesis of T11 on T12. Trace
anterolisthesis of L3 on L4, L4 on L5, and L5 on S1.

Vertebrae: Sequelae of posterior fusion are again identified
extending from T10 to the sacrum with evidence of solid posterior
osseous fusion at each level. Pedicle screws are present bilaterally
from T10-S1 without evidence of loosening. Screws also extend across
the SI joints bilaterally. There has also been prior anterior fusion
at L4-5 and L5-S1 with anterior screws and interbody cages in place
and evidence of solid arthrodesis at both levels. No acute fracture
or suspicious osseous lesion is identified.

Paraspinal and other soft tissues: Partially visualized left-sided
colonic diverticulosis. Mild aortic atherosclerosis.

Disc levels:

T9-10: New severe disc space narrowing with prominent degenerative
endplate irregularity and sclerosis. No stenosis.

T10-11 through L5-S1: Previous posterior fusion with multilevel
posterior decompression in the lumbar spine. Spinal canal
intermittently obscured by streak artifact. No significant stenosis
identified.
IMPRESSION: 1. Solid posterior fusion from T10 to the sacrum. No evidence of
hardware complication.
2. New severe adjacent segment disc degeneration at T9-10 without
evidence of stenosis.
3. Aortic Atherosclerosis ([EQ]-[EQ]).

## 2019-09-13 MED ORDER — CITALOPRAM HYDROBROMIDE 10 MG PO TABS: 10.0000 mg | ORAL_TABLET | Freq: Every day | ORAL | 3 refills | Status: DC

## 2019-09-13 MED ORDER — ATORVASTATIN CALCIUM 10 MG PO TABS: 10.0000 mg | ORAL_TABLET | ORAL | 3 refills | Status: DC

## 2019-09-13 NOTE — Progress Notes (Signed)
Dr Suzi Roots Glade Nurse, [place], [facility]. Phone [phone]. Patient would like the xrays. She is having MRI on back.

## 2019-09-14 LAB — COMPREHENSIVE METABOLIC PANEL
AG Ratio: 1.8 (calc) (ref 1.0–2.5)
ALT: 15 U/L (ref 6–29)
AST: 20 U/L (ref 10–35)
Albumin: 4.3 g/dL (ref 3.6–5.1)
Alkaline phosphatase (APISO): 92 U/L (ref 37–153)
BUN/Creatinine Ratio: 28 (calc) — ABNORMAL HIGH (ref 6–22)
BUN: 16 mg/dL (ref 7–25)
CO2: 26 mmol/L (ref 20–32)
Calcium: 9.6 mg/dL (ref 8.6–10.4)
Chloride: 99 mmol/L (ref 98–110)
Creat: 0.57 mg/dL — ABNORMAL LOW (ref 0.60–0.93)
Globulin: 2.4 g/dL (calc) (ref 1.9–3.7)
Glucose, Bld: 90 mg/dL (ref 65–99)
Potassium: 4.1 mmol/L (ref 3.5–5.3)
Sodium: 137 mmol/L (ref 135–146)
Total Bilirubin: 0.6 mg/dL (ref 0.2–1.2)
Total Protein: 6.7 g/dL (ref 6.1–8.1)

## 2019-09-14 LAB — CBC WITH DIFFERENTIAL/PLATELET
Absolute Monocytes: 319 cells/uL (ref 200–950)
Basophils Absolute: 29 cells/uL (ref 0–200)
Basophils Relative: 0.5 %
Eosinophils Absolute: 139 cells/uL (ref 15–500)
Eosinophils Relative: 2.4 %
HCT: 40.2 % (ref 35.0–45.0)
Hemoglobin: 13.4 g/dL (ref 11.7–15.5)
Lymphs Abs: 1235 cells/uL (ref 850–3900)
MCH: 29.1 pg (ref 27.0–33.0)
MCHC: 33.3 g/dL (ref 32.0–36.0)
MCV: 87.4 fL (ref 80.0–100.0)
MPV: 10.6 fL (ref 7.5–12.5)
Monocytes Relative: 5.5 %
Neutro Abs: 4077 cells/uL (ref 1500–7800)
Neutrophils Relative %: 70.3 %
Platelets: 250 10*3/uL (ref 140–400)
RBC: 4.6 10*6/uL (ref 3.80–5.10)
RDW: 13.1 % (ref 11.0–15.0)
Total Lymphocyte: 21.3 %
WBC: 5.8 10*3/uL (ref 3.8–10.8)

## 2019-09-14 LAB — HEMOGLOBIN A1C
Hgb A1c MFr Bld: 5.6 % of total Hgb (ref <5.7)
Mean Plasma Glucose: 114 (calc)
eAG (mmol/L): 6.3 (calc)

## 2019-09-14 LAB — LIPID PANEL
Cholesterol: 241 mg/dL — ABNORMAL HIGH (ref <200)
HDL: 82 mg/dL (ref >=50)
LDL Cholesterol (Calc): 138 mg/dL (calc) — ABNORMAL HIGH
Non-HDL Cholesterol (Calc): 159 mg/dL (calc) — ABNORMAL HIGH (ref <130)
Total CHOL/HDL Ratio: 2.9 (calc) (ref <5.0)
Triglycerides: 106 mg/dL (ref <150)

## 2019-09-14 LAB — TEST AUTHORIZATION

## 2019-09-14 LAB — TSH: TSH: 2.63 mIU/L (ref 0.40–4.50)

## 2019-09-17 ENCOUNTER — Encounter: Payer: Self-pay | Admitting: Internal Medicine

## 2019-09-17 ENCOUNTER — Other Ambulatory Visit: Payer: Self-pay

## 2019-09-17 ENCOUNTER — Ambulatory Visit (INDEPENDENT_AMBULATORY_CARE_PROVIDER_SITE_OTHER): Payer: Medicare Other | Admitting: Internal Medicine

## 2019-09-17 VITALS — BP 160/80 | HR 86 | Ht 59.0 in | Wt 125.2 lb

## 2019-09-17 DIAGNOSIS — R002 Palpitations: Secondary | ICD-10-CM

## 2019-09-17 DIAGNOSIS — I10 Essential (primary) hypertension: Secondary | ICD-10-CM

## 2019-09-17 DIAGNOSIS — R079 Chest pain, unspecified: Secondary | ICD-10-CM | POA: Diagnosis not present

## 2019-09-17 DIAGNOSIS — R072 Precordial pain: Secondary | ICD-10-CM

## 2019-09-17 DIAGNOSIS — I700 Atherosclerosis of aorta: Secondary | ICD-10-CM | POA: Diagnosis not present

## 2019-09-17 DIAGNOSIS — E785 Hyperlipidemia, unspecified: Secondary | ICD-10-CM | POA: Diagnosis not present

## 2019-09-17 DIAGNOSIS — R0609 Other forms of dyspnea: Secondary | ICD-10-CM

## 2019-09-17 DIAGNOSIS — R0600 Dyspnea, unspecified: Secondary | ICD-10-CM

## 2019-09-17 MED ORDER — METOPROLOL TARTRATE 25 MG PO TABS: 25.0000 mg | ORAL_TABLET | Freq: Two times a day (BID) | ORAL | 3 refills | Status: DC

## 2019-09-17 MED ORDER — NITROGLYCERIN 0.4 MG SL SUBL: 0.4000 mg | SUBLINGUAL_TABLET | SUBLINGUAL | 3 refills | Status: DC | PRN

## 2019-09-17 NOTE — Patient Instructions (Addendum)
Medication Instructions:  - Your physician has recommended you make the following change in your medication:   1) Start metoprolol tartrate 25 mg- take 1 tablet by mouth twice daily  2) Start nitroglycerin 0.4 mg sublingual tablets- place 1 tablet under the tongue every 5 minutes up to 3 doses at a time as needed for chest pain/pressure  *If you need a refill on your cardiac medications before your next appointment, please call your pharmacy*  Lab Work: - none ordered  If you have labs (blood work) drawn today and your tests are completely normal, you will receive your results only by: Marland Kitchen MyChart Message (if you have MyChart) OR . A paper copy in the mail If you have any lab test that is abnormal or we need to change your treatment, we will call you to review the results.  Testing/Procedures: - Your physician has recommended that you have a Cardiac CT scan  - Your cardiac CT will be scheduled at one of the below locations:   [facility] [address] [place], [place] [address] (336) [place] [address] [employer] [place],  [address] [phone]  If scheduled at [facility], please arrive at the [facility] main entrance of [facility] 30-45 minutes prior to test start time. Proceed to the [facility] Radiology Department (first floor) to check-in and test prep.  If scheduled at [facility], please arrive 15 mins early for check-in and test prep.  Please follow these instructions carefully (unless otherwise directed):  Hold all erectile dysfunction medications at least 3 days (72 hrs) prior to test.  On the Night Before the Test: . Be sure to Drink plenty of water. . Do not consume any caffeinated/decaffeinated beverages or chocolate 12 hours prior to your test. . Do not take any antihistamines 12 hours prior to your test.  On the Day of the Test: . Drink  plenty of water. Do not drink any water within one hour of the test. . Do not eat any food 4 hours prior to the test. . You may take your regular medications prior to the test.  . Take metoprolol (Lopressor) two hours prior to test. . HOLD Furosemide/Hydrochlorothiazide morning of the test. . FEMALES- please wear underwire-free bra if available       After the Test: . Drink plenty of water. . After receiving IV contrast, you may experience a mild flushed feeling. This is normal. . On occasion, you may experience a mild rash up to 24 hours after the test. This is not dangerous. If this occurs, you can take Benadryl 25 mg and increase your fluid intake. . If you experience trouble breathing, this can be serious. If it is severe call 911 IMMEDIATELY. If it is mild, please call our office. . If you take any of these medications: Glipizide/Metformin, Avandament, Glucavance, please do not take 48 hours after completing test unless otherwise instructed.   Once we have confirmed authorization from your insurance company, we will call you to set up a date and time for your test.   For non-scheduling related questions, please contact the cardiac imaging nurse navigator should you have any questions/concerns: Marchia Bond, RN Navigator Cardiac Imaging [facility] [phone] Office     Follow-Up: At [facility], you and your health needs are our priority.  As part of our continuing mission to provide you with exceptional heart care, we  have created designated Provider Care Teams.  These Care Teams include your primary Cardiologist (physician) and Advanced Practice Providers (APPs -  Physician Assistants and Nurse Practitioners) who all work together to provide you with the care you need, when you need it.  Your next appointment:   1 month  The format for your next appointment:   In Person  Provider:    You may see [name], MD or one of the following  Advanced Practice Providers on your designated Care Team:    Murray Hodgkins, NP  Christell Faith, PA-C  Marrianne Mood, PA-C   Other Instructions  - If your symptoms worsen at any time, please report to the ER for further evaluation and possible treatment.

## 2019-09-17 NOTE — Progress Notes (Signed)
New Outpatient Visit Date: 09/17/2019  Referring Provider: McLean-Scocuzza, Nino Glow, MD [place],  [place] [address]  Chief Complaint: Chest pain, shortness of breath, and palpitations  HPI:  Ms. Hagemeister is a 73 y.o. female who is being seen today for the evaluation of chest pain, shortness of breath, and palpitations at the request of Dr. Terese Door. She has a history of hypertension, hyperlipidemia, right breast cancer status post lumpectomy and radiation, hypothyroidism, and esophagitis.  She saw Dr. Terese Door last week and noted intermittent chest discomfort, shortness of breath, and palpitations.  Of note, Ms. Henrine Screws is husband passed away 2 months ago.  Today, Ms. Rebeck reports that she has been feeling a number of symptoms over the last year.  These include chest heaviness that comes on randomly and lasts about 15 minutes.  There are no exacerbating or alleviating factors.  She also experiences palpitations in which it feels like her heart races.  These episodes often happen at rest and last about 15 minutes before subsiding on their own.  There are no associated symptoms either.  The chest pain and palpitations do not correspond temporally.  She has tried walking some recently and feels like she gets more out of breath with modest exertion.  She denies orthopnea and edema.  Ms. Staber underwent stress testing in [place], [place], 10 to 15 years ago.  She does not know exactly what the reason for the test was but ultimately required catheterization due to an abnormality on the stress test.  She reports that the catheterization was clean.  She has not had any other cardiac testing.  She presented to the [facility] emergency department last November with similar chest discomfort.  She was admitted and ruled out by enzymes.  CTA of the chest was negative for PE.  No ischemia evaluation was performed at that time.  Ms. Prior was recently placed on omeprazole by her PCP but does not  think that she has been on it long enough to tell a difference in any of her symptoms.  --------------------------------------------------------------------------------------------------  Cardiovascular History & Procedures: Cardiovascular Problems:  Chest pain  Shortness of breath  Palpitations  Risk Factors:  Hypertension, hyperlipidemia, age greater than 39  Cath/PCI:  None available.  Patient reports clean catheterization 10 to 15 years ago in [place], [place].  CV Surgery:  None  EP Procedures and Devices:  None  Non-Invasive Evaluation(s):  None available.  Patient reports abnormal stress test attributed to breast attenuation 10 to 15 years ago in [place], [place].  Recent CV Pertinent Labs: Lab Results  Component Value Date   CHOL 241 (H) 09/10/2019   HDL 82 09/10/2019   LDLCALC 138 (H) 09/10/2019   TRIG 106 09/10/2019   CHOLHDL 2.9 09/10/2019   K 4.1 09/10/2019   MG 2.1 10/10/2018   BUN 16 09/10/2019   CREATININE 0.57 (L) 09/10/2019    --------------------------------------------------------------------------------------------------  Past Medical History:  Diagnosis Date   Arthritis    neck, back, left knee;    Breast cancer ([facility]) 2009   right lumpectomy s/p radiation    Chicken pox    Chronic UTI    established with urology    Diverticular disease    -osis and -itis    Esophagitis    egd 05/17/14 see report scanned into chart   Hormone disorder    Hyperlipidemia    Hypertension    controlled well with medication;    Personal history of radiation therapy 2009   F/U right breast  cancer   Thyroid disease    hypothyroidism     Past Surgical History:  Procedure Laterality Date   BONE GRAFT HIP ILIAC CREST     + cage left hip 10/23/17    BREAST BIOPSY Left 2009   clip,benign   BREAST BIOPSY Right 2009   +   BREAST LUMPECTOMY Right 2009   2009 lumpectomy    CARDIAC CATHETERIZATION     No stents; [facility] doesn't recall  facility +20yrs ago   EYE SURGERY     b/l cataracts sch 10/2019 in [place] N/A 05/29/2018   Procedure: INSERTION OF MESH;  Surgeon: Johnathan Hausen, MD;  Location: [facility];  Service: General;  Laterality: N/A;   SHOULDER SURGERY     x2 surgeries both shoulders, right shoulder replacement last in 2012    [place]  10/23/2017   spinal 09/2017 h/o scoliosis [facility] L4-S1 OLIF T10 to ililum fusion   TONSILLECTOMY     age 59 y.o.    TOTAL SHOULDER REPLACEMENT     VENTRAL HERNIA REPAIR N/A 05/29/2018   Procedure: LAPAROSCOPIC VENTRAL / LATERAL HERNIA REPAIR;  Surgeon: Johnathan Hausen, MD;  Location: [facility];  Service: General;  Laterality: N/A;    Current Meds  Medication Sig   aspirin EC 81 MG tablet Take 1 tablet (81 mg total) by mouth daily.   atorvastatin (LIPITOR) 10 MG tablet Take 1 tablet (10 mg total) by mouth every other day. Note 10 mg no need to cut in 1/2 as before you have 20 mg taking 1/2 pill =10 mg   Cholecalciferol (VITAMIN D3 PO) Take 1 capsule by mouth daily.   fluticasone (FLONASE) 50 MCG/ACT nasal spray Place 2 sprays into both nostrils daily. Max b/l nostrils   levothyroxine (SYNTHROID) 88 MCG tablet Take 1 tablet (88 mcg total) by mouth daily before breakfast. 30 minutes   lisinopril (ZESTRIL) 30 MG tablet Take 1 tablet (30 mg total) by mouth daily.   Multiple Vitamin (MULTI-VITAMINS) TABS Take 1 tablet by mouth daily.    neomycin-polymyxin-hydrocortisone (CORTISPORIN) OTIC solution Place 4 drops into the right ear 4 (four) times daily. X 1 week   omeprazole (PRILOSEC) 40 MG capsule Take 1 capsule (40 mg total) by mouth daily. 30 minutes before food   zolpidem (AMBIEN) 5 MG tablet Take 1 tablet (5 mg total) by mouth at bedtime as needed for sleep.    Allergies: Sulfa antibiotics and Norvasc [amlodipine besylate]  Social History   Tobacco Use   Smoking status: Never Smoker   Smokeless tobacco: Never Used  Substance Use Topics    Alcohol use: Not Currently    Alcohol/week: 1.0 standard drinks    Types: 1 Glasses of wine per week    Comment: nightly   Drug use: No    Family History  Problem Relation Age of Onset   Arthritis Mother    Heart disease Mother    Hyperlipidemia Mother    Hypertension Mother    Arthritis Father    Diabetes Father    Cancer Father        colon   Arthritis Sister    Hyperlipidemia Sister    Hypertension Sister    Cancer Brother        lung, smoker   Mental retardation Brother    Drug abuse Daughter        overdose in 2018    Arthritis Sister    Hyperlipidemia Sister    Bladder Cancer  Neg Hx    Kidney cancer Neg Hx     Review of Systems: A 12-system review of systems was performed and was negative except as noted in the HPI.  --------------------------------------------------------------------------------------------------  Physical Exam: BP (!) 160/80 (BP Location: Left Arm, Patient Position: Sitting, Cuff Size: Normal)    Pulse 86    Ht 4\' 11"  (1.499 m)    Wt 125 lb 4 oz (56.8 kg)    SpO2 98%    BMI 25.30 kg/m   General: Mildly anxious appearing. HEENT: No conjunctival pallor or scleral icterus.  Facemask in place. Neck: Supple without lymphadenopathy, thyromegaly, JVD, or HJR. No carotid bruit. Lungs: Normal work of breathing. Clear to auscultation bilaterally without wheezes or crackles. Heart: Regular rate and rhythm without murmurs, rubs, or gallops. Non-displaced PMI. Abd: Bowel sounds present. Soft, NT/ND without hepatosplenomegaly Ext: No lower extremity edema. Radial, PT, and DP pulses are 2+ bilaterally Skin: Warm and dry without rash. Neuro: CNIII-XII intact. Strength and fine-touch sensation intact in upper and lower extremities bilaterally. Psych: Normal mood and affect.  EKG: Normal sinus rhythm with nonspecific ST changes, less pronounced than prior EKG from 10/09/2018.  Lab Results  Component Value Date   WBC 5.8 09/10/2019   HGB  13.4 09/10/2019   HCT 40.2 09/10/2019   MCV 87.4 09/10/2019   PLT 250 09/10/2019    Lab Results  Component Value Date   NA 137 09/10/2019   K 4.1 09/10/2019   CL 99 09/10/2019   CO2 26 09/10/2019   BUN 16 09/10/2019   CREATININE 0.57 (L) 09/10/2019   GLUCOSE 90 09/10/2019   ALT 15 09/10/2019    Lab Results  Component Value Date   CHOL 241 (H) 09/10/2019   HDL 82 09/10/2019   LDLCALC 138 (H) 09/10/2019   TRIG 106 09/10/2019   CHOLHDL 2.9 09/10/2019     --------------------------------------------------------------------------------------------------  ASSESSMENT AND PLAN: Chest pain and dyspnea on exertion: Chest pain has both typical and atypical consult opponents, as it is a heavy sensation but unrelated to activity.  Exertional dyspnea is notable but could be due to a number of factors.  While it is possible that stress is playing into her symptoms, she has several cardiac risk factors.  Given that she has been having some heaviness even today, I recommended evaluation in the emergency department.  However, she declines.  I advised her to seek immediate medical attention should her chest pain persist or become worse.  In the meantime, we will start metoprolol tartrate 25 mg daily.  I have also given her prescription for sublingual nitroglycerin to be used as needed for chest pain.  She should continue aspirin 81 mg daily and atorvastatin 10 mg daily, though further escalation will likely need to be performed in the future.  We will make arrangements for a cardiac CTA for further assessment of her chest pain.  I have also asked her to continue taking omeprazole for possible component of GERD.  Palpitations: Rapid heartbeat seem to be independent of aforementioned chest pain and shortness of breath.  We may need to consider ambulatory cardiac monitoring but will defer this until after aforementioned ischemia evaluation.  It is possible that metoprolol may help with some of her  palpitations as well.  Recent electrolytes and TSH were normal.  Hypertension: Blood pressure poorly controlled today.  We will continue lisinopril 30 mg daily and add metoprolol tartrate 25 mg twice daily.  Hyperlipidemia: LDL suboptimally controlled in the setting of aortic atherosclerosis  noted on prior CT of the chest as well as concern for possible CAD.  We will need to consider escalation of atorvastatin, though we will defer medication changes for now.  Aortic atherosclerosis: Incidentally noted on recent lumbar spine CT.  Ms. Ciraolo will need blood pressure and lipid control, as outlined above.  We will continue low-dose aspirin as well.  Follow-up: Return to clinic in 1 month.  [name], MD 09/17/2019 10:08 AM

## 2019-09-24 ENCOUNTER — Encounter: Payer: Self-pay | Admitting: Internal Medicine

## 2019-09-24 ENCOUNTER — Telehealth: Payer: Self-pay | Admitting: Internal Medicine

## 2019-09-24 NOTE — Telephone Encounter (Signed)
err

## 2019-09-29 NOTE — Addendum Note (Signed)
Addended by: Verlon Au on: 09/29/2019 02:51 PM   Modules accepted: Orders

## 2019-10-11 ENCOUNTER — Telehealth (HOSPITAL_COMMUNITY): Payer: Self-pay | Admitting: Emergency Medicine

## 2019-10-11 NOTE — Telephone Encounter (Signed)
Left message on voicemail with name and callback number Sara Wallace RN Navigator Cardiac Imaging [facility] [phone] Office [phone] Cell  

## 2019-10-13 ENCOUNTER — Ambulatory Visit
Admission: RE | Admit: 2019-10-13 | Discharge: 2019-10-13 | Disposition: A | Discharge status: Home / Self Care | Payer: Medicare Other | Source: Ambulatory Visit | Attending: Internal Medicine | Admitting: Internal Medicine

## 2019-10-13 ENCOUNTER — Other Ambulatory Visit: Payer: Self-pay

## 2019-10-13 DIAGNOSIS — R072 Precordial pain: Secondary | ICD-10-CM | POA: Insufficient documentation

## 2019-10-13 IMAGING — CT CT HEART MORP W/ CTA COR W/ SCORE W/ CA W/CM &/OR W/O CM
2 of 9 series · 9 of 20 positions shown, 11 images · non-contrast
Comparison: [DATE].

Addendum:
CLINICAL DATA: Chest pain

EXAM:
Cardiac CTA
MEDICATIONS:
Sub lingual nitro. 4mg and lopressor 5mg
TECHNIQUE: The patient was scanned on a Siemens Force [REDACTED]ice scanner. Gantry
rotation speed was 250 msecs. Collimation was .6 mm. A 100 kV
prospective scan was triggered in the ascending thoracic aorta at
140 HU's Full mA was used between 35% and 75% of the R-R interval.
Average HR during the scan was 59 bpm. The 3D data set was
interpreted on a dedicated work station using MPR, MIP and VRT
modes. A total of 80cc of contrast was used.

[Series 21: multiphase % cta coronary (person_name) 0.60 · axial · 0.33mm/px · z∈[-1459,-1388]mm · 4 of 2051 slices shown]
[im 411/2051  vessel]
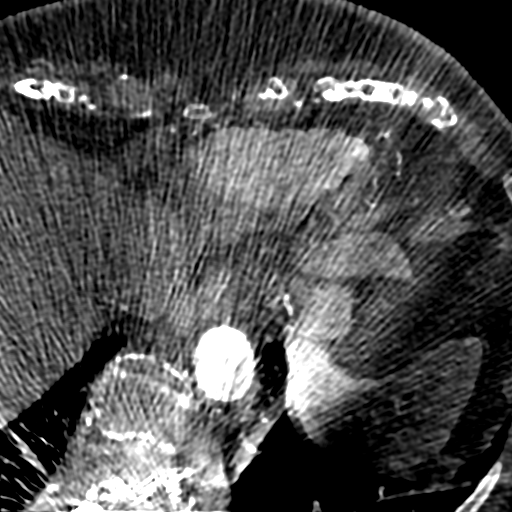
[im 821/2051  vessel]
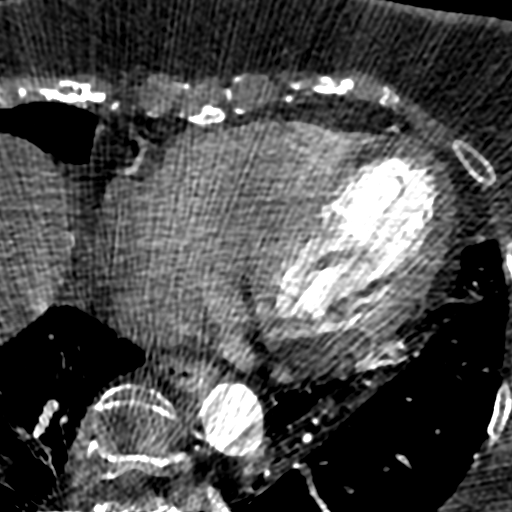
[im 1231/2051  vessel]
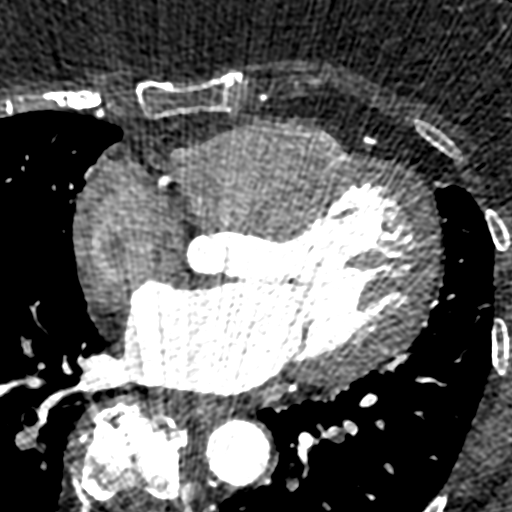
[im 1641/2051  vessel]
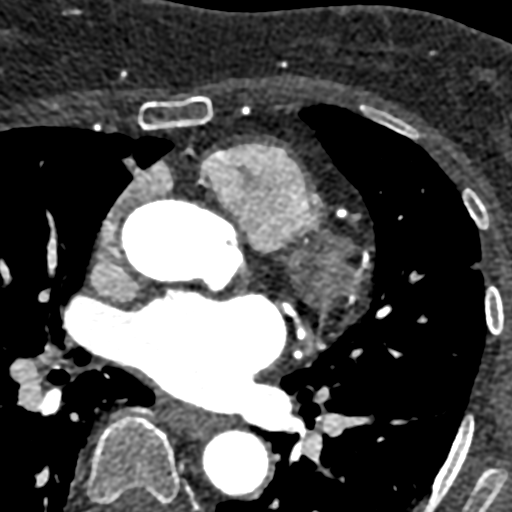

[Series 25: ms multiphase cta coronary (person_name) 0.60 · axial · 0.33mm/px · z∈[-1463,-1384]mm · 5 of 2051 slices shown, 7 images]
[im 342/2051  vessel]
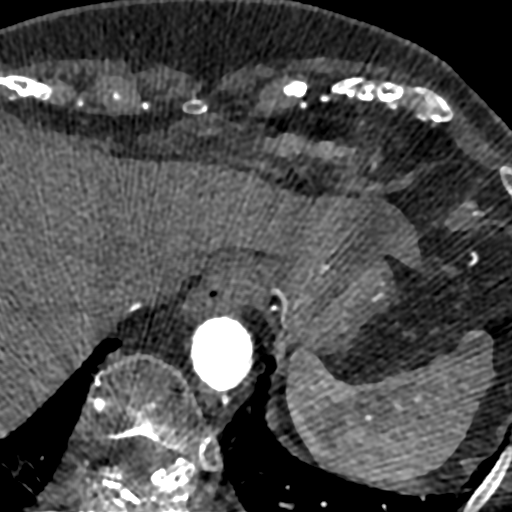
[im 342/2051  lung]
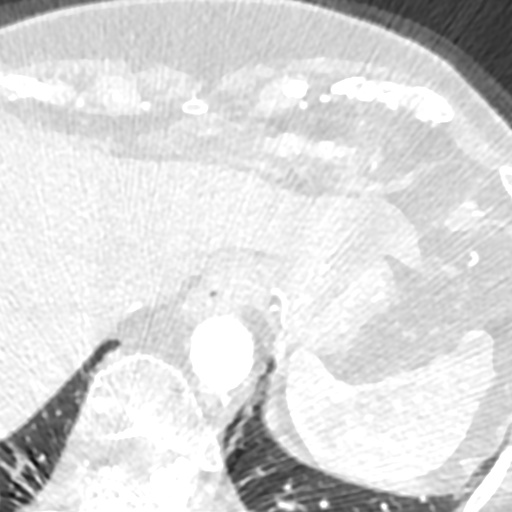
[im 684/2051  vessel]
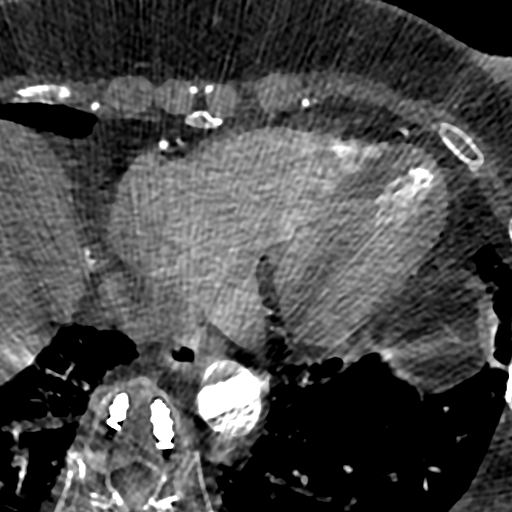
[im 1026/2051  vessel]
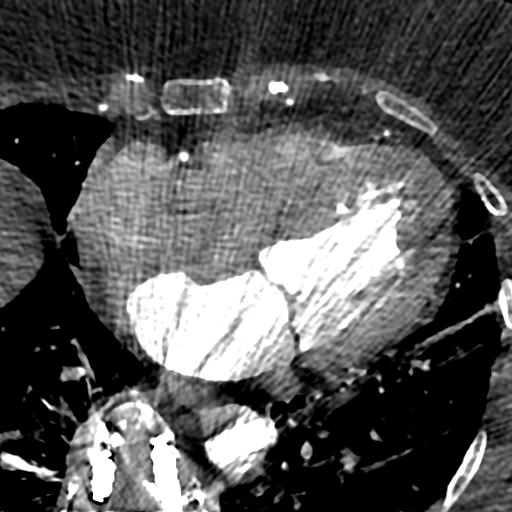
[im 1367/2051  vessel]
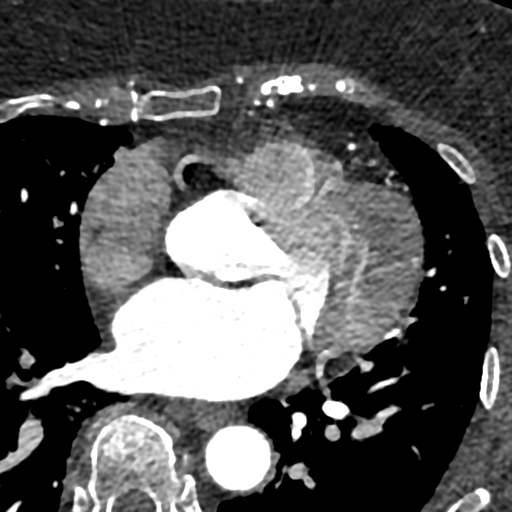
[im 1709/2051  vessel]
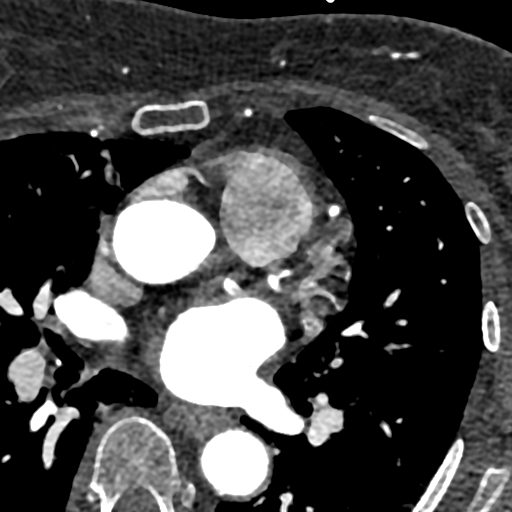
[im 1709/2051  lung]
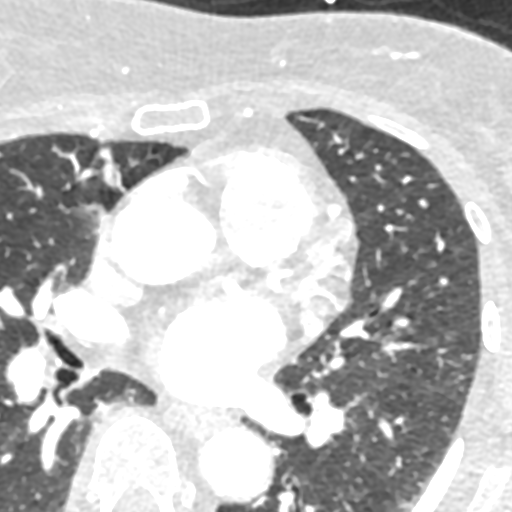

[9 of 20 positions shown; findings below may reference images not displayed]

FINDINGS: Non-cardiac: See separate report from [REDACTED]. No
significant findings on limited lung and soft tissue windows.

Calcium Score: No calcium noted

Coronary Arteries: Right dominant with no anomalies

LM: Normal

LAD:  Normal

IM: Normal

D1: Normal

D2: Normal

Circumflex: Normal

OM1: Normal

OM2: Normal

RCA: Normal

PDA: Normal

PLA: Normal
IMPRESSION: 1. Normal aortic root 3.0 cm

2.  Calcium score 0

3. Normal right dominant coronary arteries Significant motion
artifact

IOKUAN

EXAM:
OVER-READ INTERPRETATION  CT CHEST

The following report is an over-read performed by radiologist Dr.
IOKUAN [REDACTED] on [DATE]. This
over-read does not include interpretation of cardiac or coronary
anatomy or pathology. The interpretation by the cardiologist is
attached.
FINDINGS: There is extensive streak artifact from thoracolumbar spinal
hardware. Mild central bronchiectasis. There is a new rounded,
possibly fat density, subpleural nodule in the posteroinferior right
lower lobe, measuring at least 0.8 x 1.6 cm, incompletely imaged. 4
mm peripheral right lower lobe nodule (7/32), unchanged and
considered benign. Mild scarring in the right middle lobe, lingula
and left lower lobe. No pleural fluid. Visualized upper abdomen and
bones are grossly unremarkable.
IMPRESSION: New rounded subpleural nodule in the inferior right lower lobe,
possibly fat in density but incompletely imaged and somewhat
obscured by streak artifact from adjacent spinal hardware. Chest CT
without contrast may be helpful in further evaluation, as clinically
indicated.

*** End of Addendum ***
FINDINGS: Non-cardiac: See separate report from [REDACTED]. No
significant findings on limited lung and soft tissue windows.

Calcium Score: No calcium noted

Coronary Arteries: Right dominant with no anomalies

LM: Normal

LAD:  Normal

IM: Normal

D1: Normal

D2: Normal

Circumflex: Normal

OM1: Normal

OM2: Normal

RCA: Normal

PDA: Normal

PLA: Normal
IMPRESSION: 1. Normal aortic root 3.0 cm

2.  Calcium score 0

3. Normal right dominant coronary arteries Significant motion
artifact

IOKUAN

## 2019-10-13 MED ORDER — IOHEXOL 350 MG/ML SOLN
75.0000 mL | Freq: Once | INTRAVENOUS | Status: AC | PRN
  Administered 2019-10-13: 75 mL via INTRAVENOUS

## 2019-10-13 MED ORDER — NITROGLYCERIN 0.4 MG SL SUBL
0.8000 mg | SUBLINGUAL_TABLET | Freq: Once | SUBLINGUAL | Status: AC
  Administered 2019-10-13: 0.8 mg via SUBLINGUAL

## 2019-10-13 MED ORDER — METOPROLOL TARTRATE 5 MG/5ML IV SOLN: 5.0000 mg | INTRAVENOUS | Status: DC | PRN

## 2019-10-13 NOTE — Progress Notes (Signed)
Patient tolerated CT without incident. Patient drank a soda after. Ambulatory to exit.

## 2019-10-14 ENCOUNTER — Telehealth: Payer: Self-pay

## 2019-10-14 NOTE — Telephone Encounter (Signed)
Call to patient to discuss results and POC following cardiac CT.   Pt verbalized understanding and will reach out to PCP regarding lung nodule.   Advised pt to call for any further questions or concerns.   Upcoming appt confirmed.

## 2019-10-14 NOTE — Telephone Encounter (Signed)
-----   Message from [name], MD sent at 10/14/2019 10:15 AM EST ----- Please let Ms. Frith know that her cardiac CTA shows normal coronary arteries without evidence of blockage.  Incidental note was made of a small nodule in her right lower lobe; the radiologist recommended that a dedicated chest CT may be helpful to further assess this.  I will defer ordering this to her PCP, Dr. Terese Door (copied on this message).  We will follow-up as planned to reassess her symptoms.

## 2019-10-17 ENCOUNTER — Other Ambulatory Visit: Payer: Self-pay | Admitting: Internal Medicine

## 2019-10-17 DIAGNOSIS — R931 Abnormal findings on diagnostic imaging of heart and coronary circulation: Secondary | ICD-10-CM

## 2019-10-17 DIAGNOSIS — R918 Other nonspecific abnormal finding of lung field: Secondary | ICD-10-CM

## 2019-10-17 DIAGNOSIS — R911 Solitary pulmonary nodule: Secondary | ICD-10-CM

## 2019-10-20 ENCOUNTER — Telehealth (INDEPENDENT_AMBULATORY_CARE_PROVIDER_SITE_OTHER): Payer: Medicare Other | Admitting: Internal Medicine

## 2019-10-20 ENCOUNTER — Telehealth: Payer: Self-pay | Admitting: Internal Medicine

## 2019-10-20 ENCOUNTER — Other Ambulatory Visit (INDEPENDENT_AMBULATORY_CARE_PROVIDER_SITE_OTHER): Payer: Medicare Other

## 2019-10-20 ENCOUNTER — Other Ambulatory Visit: Payer: Self-pay | Admitting: Internal Medicine

## 2019-10-20 ENCOUNTER — Other Ambulatory Visit: Payer: Self-pay

## 2019-10-20 DIAGNOSIS — N3000 Acute cystitis without hematuria: Secondary | ICD-10-CM

## 2019-10-20 MED ORDER — CIPROFLOXACIN HCL 500 MG PO TABS: 500.0000 mg | ORAL_TABLET | Freq: Two times a day (BID) | ORAL | 0 refills | Status: DC

## 2019-10-20 NOTE — Telephone Encounter (Signed)
Copied from [place] [id number]. Topic: Appointment Scheduling - Scheduling Inquiry for Clinic >> Oct 20, 2019 10:15 AM Yvette Rack wrote: Reason for CRM: Pt would like to be seen today for possible UTI. Attempted to transfer pt to the office but was asked to send a message. Pt requests call back

## 2019-10-20 NOTE — Telephone Encounter (Signed)
Please ad labs and patient agreeable to treatment Via My chart please ad labs.

## 2019-10-20 NOTE — Telephone Encounter (Addendum)
Please ad labs and patient agreeable to treatment Via My chart please ad labs.      Documentation      She needs an appt or we can address via my chart for fee  -agreeable?   She will need a urine labs test  Does want to go here or [facility]?       Documentation      Patient say she gets UTIs alot has not see Urology in year and half has been taking some Macrobid thaT she has had from past infection has been taking some year and half old Macrobid  100 mg once daily that has not helped , increased water intake . Still having symptoms .  Patient does not want to  Go to UC for fear of COVID .      Documentation      Copied from [place] [id number]. Topic: Appointment Scheduling - Scheduling Inquiry for Clinic >> Oct 20, 2019 10:15 AM Yvette Rack wrote: Reason for CRM: Pt would like to be seen today for possible UTI. Attempted to transfer pt to the office but was asked to send a message. Pt requests call back     S: Friday of Sat had UTI like sxs with trouble emptying increased urgency, pain with urination though not painful to pee and abdomen feels hard this is similar to previous UTI   A/P UA and culture  Rx cipro 500 mg bid x 5-7 days  Increase hydration call back if not better  If not better let me know  Time spent 5-10 minutes pt agreeable to fee telephone consult  [place] (see documentation below as well    Telephone Note  I connected with Mrs. Babic   on 10/20/19 at 7:00 pm  by a telephone and verified that I am speaking with the correct person using two identifiers.  Location patient: home Location provider:work or home office Persons participating in the virtual visit: patient, provider  I discussed the limitations of evaluation and management by telemedicine and the availability of in person appointments. The patient expressed understanding and agreed to proceed.   HPI: Friday of Sat had UTI like sxs with trouble emptying increased urgency, pain with urination though  not painful to pee and abdomen feels hard this is similar to previous UTI      ROS: See pertinent positives and negatives per HPI.  Past Medical History:  Diagnosis Date  . Arthritis    neck, back, left knee;   . Breast cancer [facility]) 2009   right lumpectomy s/p radiation   . Chicken pox   . Chronic UTI    established with urology   . Diverticular disease    -osis and -itis   . Esophagitis    egd 05/17/14 see report scanned into chart  . Hormone disorder   . Hyperlipidemia   . Hypertension    controlled well with medication;   . Personal history of radiation therapy 2009   F/U right breast cancer  . Thyroid disease    hypothyroidism     Past Surgical History:  Procedure Laterality Date  . BONE GRAFT HIP ILIAC CREST     + cage left hip 10/23/17   . BREAST BIOPSY Left 2009   clip,benign  . BREAST BIOPSY Right 2009   +  . BREAST LUMPECTOMY Right 2009   2009 lumpectomy   . CARDIAC CATHETERIZATION     No stents; [facility] doesn't recall facility +69yrs ago  . EYE SURGERY  b/l cataracts sch 10/2019 in [place]  . INSERTION OF MESH N/A 05/29/2018   Procedure: INSERTION OF MESH;  Surgeon: Johnathan Hausen, MD;  Location: [facility];  Service: General;  Laterality: N/A;  . SHOULDER SURGERY     x2 surgeries both shoulders, right shoulder replacement last in 2012   . SPINE SURGERY  10/23/2017   spinal 09/2017 h/o scoliosis [facility] L4-S1 OLIF T10 to ililum fusion  . TONSILLECTOMY     age 74 y.o.   . TOTAL SHOULDER REPLACEMENT    . VENTRAL HERNIA REPAIR N/A 05/29/2018   Procedure: LAPAROSCOPIC VENTRAL / [place];  Surgeon: Johnathan Hausen, MD;  Location: [facility];  Service: General;  Laterality: N/A;    Family History  Problem Relation Age of Onset  . Arthritis Mother   . Heart disease Mother 19  . Hyperlipidemia Mother   . Hypertension Mother   . Coronary artery disease Mother 62  . Arthritis Father   . Diabetes Father   . Cancer Father        colon  . AAA  (abdominal aortic aneurysm) Father   . Arthritis Sister   . Hyperlipidemia Sister   . Hypertension Sister   . Cancer Brother        lung, smoker  . Mental retardation Brother   . Drug abuse Daughter        overdose in 2018   . Arthritis Sister   . Hyperlipidemia Sister   . Bladder Cancer Neg Hx   . Kidney cancer Neg Hx     SOCIAL HX: widowed lives alone    Current Outpatient Medications:  .  amoxicillin-clavulanate (AUGMENTIN) 875-125 MG tablet, Take 1 tablet by mouth 2 (two) times daily. With food stop cipro, Disp: 14 tablet, Rfl: 0 .  aspirin EC 81 MG tablet, Take 1 tablet (81 mg total) by mouth daily., Disp: , Rfl:  .  atorvastatin (LIPITOR) 10 MG tablet, Take 1 tablet (10 mg total) by mouth every other day. Note 10 mg no need to cut in 1/2 as before you have 20 mg taking 1/2 pill =10 mg, Disp: 90 tablet, Rfl: 3 .  Cholecalciferol (VITAMIN D3 PO), Take 1 capsule by mouth daily., Disp: , Rfl:  .  ciprofloxacin (CIPRO) 500 MG tablet, Take 1 tablet (500 mg total) by mouth 2 (two) times daily. X 5-7 days with food, Disp: 14 tablet, Rfl: 0 .  fluticasone (FLONASE) 50 MCG/ACT nasal spray, Place 2 sprays into both nostrils daily. Max b/l nostrils, Disp: 48 g, Rfl: 4 .  levothyroxine (SYNTHROID) 88 MCG tablet, Take 1 tablet (88 mcg total) by mouth daily before breakfast. 30 minutes, Disp: 90 tablet, Rfl: 3 .  lisinopril (ZESTRIL) 30 MG tablet, Take 1 tablet (30 mg total) by mouth daily., Disp: 90 tablet, Rfl: 3 .  metoprolol tartrate (LOPRESSOR) 25 MG tablet, Take 1 tablet (25 mg total) by mouth 2 (two) times daily., Disp: 180 tablet, Rfl: 3 .  Multiple Vitamin (MULTI-VITAMINS) TABS, Take 1 tablet by mouth daily. , Disp: , Rfl:  .  nitroGLYCERIN (NITROSTAT) 0.4 MG SL tablet, Place 1 tablet (0.4 mg total) under the tongue every 5 (five) minutes as needed for chest pain., Disp: 25 tablet, Rfl: 3 .  omeprazole (PRILOSEC) 40 MG capsule, Take 1 capsule (40 mg total) by mouth daily. 30 minutes  before food, Disp: 90 capsule, Rfl: 3 .  zolpidem (AMBIEN) 5 MG tablet, Take 1 tablet (5 mg total) by mouth at bedtime as  needed for sleep., Disp: 90 tablet, Rfl: 1  EXAM:  VITALS per patient if applicable:  GENERAL: alert, oriented, appears well and in no acute distress  PSYCH/NEURO: pleasant and cooperative, no obvious depression or anxiety, speech and thought processing grossly intact  ASSESSMENT AND PLAN:  Discussed the following assessment and plan:  Acute cystitis without hematuria - Plan: ciprofloxacin (CIPRO) 500 MG tablet UA and culture  Rx cipro 500 mg bid x 5-7 days culture came back resistant change to Augmentin 10/25/2019  Increase hydration call back if not better  If not better let me know  rec f/u urology Dr. Allen Kell if keeps getting sx's or uti/urology   Time spent 5-10 minutes pt agreeable to fee telephone consult   -we discussed possible serious and likely etiologies, options for evaluation and workup, limitations of telemedicine visit vs in person visit, treatment, treatment risks and precautions. Pt prefers to treat via telemedicine empirically rather then risking or undertaking an in person visit at this moment. Patient agrees to seek prompt in person care if worsening, new symptoms arise, or if is not improving with treatment.   I discussed the assessment and treatment plan with the patient. The patient was provided an opportunity to ask questions and all were answered. The patient agreed with the plan and demonstrated an understanding of the instructions.   The patient was advised to call back or seek an in-person evaluation if the symptoms worsen or if the condition fails to improve as anticipated.  Time spent 5-10 min telephone Delorise Jackson, MD

## 2019-10-20 NOTE — Telephone Encounter (Signed)
Copied from [place] [id number]. Topic: Appointment Scheduling - Scheduling Inquiry for Clinic >> Oct 20, 2019 10:15 AM Yvette Rack wrote: Reason for CRM: Pt would like to be seen today for possible UTI. Attempted to transfer pt to the office but was asked to send a message. Pt requests call back

## 2019-10-20 NOTE — Telephone Encounter (Signed)
She needs an appt or we can address via my chart for fee  -agreeable?   She will need a urine labs test  Does want to go here or [facility]?

## 2019-10-20 NOTE — Telephone Encounter (Signed)
Patient say she gets UTIs alot has not see Urology in year and half has been taking some Macrobid thaT she has had from past infection has been taking some year and half old Macrobid  100 mg once daily that has not helped , increased water intake . Still having symptoms .  Patient does not want to  Go to UC for fear of COVID .

## 2019-10-22 LAB — URINALYSIS, ROUTINE W REFLEX MICROSCOPIC
Bilirubin Urine: NEGATIVE
Glucose, UA: NEGATIVE
Nitrite: POSITIVE — AB
Specific Gravity, Urine: 1.018 (ref 1.001–1.03)
pH: 5.5 (ref 5.0–8.0)

## 2019-10-22 LAB — URINE CULTURE
MICRO NUMBER:: 1139431
SPECIMEN QUALITY:: ADEQUATE

## 2019-10-25 ENCOUNTER — Other Ambulatory Visit: Payer: Self-pay | Admitting: Internal Medicine

## 2019-10-25 DIAGNOSIS — N3000 Acute cystitis without hematuria: Secondary | ICD-10-CM

## 2019-10-25 MED ORDER — AMOXICILLIN-POT CLAVULANATE 875-125 MG PO TABS: 1.0000 | ORAL_TABLET | Freq: Two times a day (BID) | ORAL | 0 refills | Status: DC

## 2019-10-26 ENCOUNTER — Telehealth: Payer: Self-pay

## 2019-10-26 ENCOUNTER — Other Ambulatory Visit: Payer: Self-pay | Admitting: Internal Medicine

## 2019-10-26 DIAGNOSIS — B373 Candidiasis of vulva and vagina: Secondary | ICD-10-CM

## 2019-10-26 DIAGNOSIS — B3731 Acute candidiasis of vulva and vagina: Secondary | ICD-10-CM

## 2019-10-26 HISTORY — PX: CATARACT EXTRACTION, BILATERAL: SHX1313

## 2019-10-26 MED ORDER — FLUCONAZOLE 150 MG PO TABS: 150.0000 mg | ORAL_TABLET | Freq: Once | ORAL | 0 refills | Status: AC

## 2019-10-26 NOTE — Telephone Encounter (Signed)
Patient stated she believe she has a yeast infection due to her ABX.

## 2019-10-26 NOTE — Telephone Encounter (Signed)
Diflucan sent

## 2019-10-27 ENCOUNTER — Ambulatory Visit (INDEPENDENT_AMBULATORY_CARE_PROVIDER_SITE_OTHER): Payer: Medicare Other | Admitting: Internal Medicine

## 2019-10-27 ENCOUNTER — Encounter: Payer: Self-pay | Admitting: Internal Medicine

## 2019-10-27 ENCOUNTER — Other Ambulatory Visit: Payer: Self-pay

## 2019-10-27 VITALS — BP 148/80 | HR 88 | Ht 59.0 in | Wt 129.5 lb

## 2019-10-27 DIAGNOSIS — R0789 Other chest pain: Secondary | ICD-10-CM

## 2019-10-27 DIAGNOSIS — R002 Palpitations: Secondary | ICD-10-CM | POA: Diagnosis not present

## 2019-10-27 DIAGNOSIS — I10 Essential (primary) hypertension: Secondary | ICD-10-CM | POA: Diagnosis not present

## 2019-10-27 DIAGNOSIS — Z79899 Other long term (current) drug therapy: Secondary | ICD-10-CM | POA: Diagnosis not present

## 2019-10-27 MED ORDER — FAMOTIDINE 20 MG PO TABS: 20.0000 mg | ORAL_TABLET | Freq: Two times a day (BID) | ORAL | Status: DC

## 2019-10-27 MED ORDER — FAMOTIDINE 20 MG PO TABS: 20.0000 mg | ORAL_TABLET | Freq: Two times a day (BID) | ORAL | 3 refills | Status: DC

## 2019-10-27 MED ORDER — LISINOPRIL 40 MG PO TABS: 40.0000 mg | ORAL_TABLET | Freq: Every day | ORAL | 3 refills | Status: DC

## 2019-10-27 NOTE — Patient Instructions (Addendum)
Medication Instructions:  Your physician has recommended you make the following change in your medication:  1- STOP Aspirin. 2- STOP Omeprazole. 3- START Famotidine 20 mg by mouth two times a day. 4- INCREASE Lisinopril to 40 mg by mouth once a day.  *If you need a refill on your cardiac medications before your next appointment, please call your pharmacy*  Lab Work: Your physician recommends that you return for lab work in: 1 week (12/9 or 12/10). BMET - Please go to the [facility]. You will check in at the front desk to the right as you walk into the atrium. Valet Parking is offered if needed. - No appointment needed. You may go any day between 7 am and 6 pm.  If you have labs (blood work) drawn today and your tests are completely normal, you will receive your results only by: Marland Kitchen MyChart Message (if you have MyChart) OR . A paper copy in the mail If you have any lab test that is abnormal or we need to change your treatment, we will call you to review the results.  Testing/Procedures: Your physician has recommended that you wear an 14 day ZIO event monitor. Event monitors are medical devices that record the heart's electrical activity. Doctors most often [place] these monitors to diagnose arrhythmias. Arrhythmias are problems with the speed or rhythm of the heartbeat. The monitor is a small, portable device. You can wear one while you do your normal daily activities. This is usually used to diagnose what is causing palpitations/syncope (passing out). This will be directly shipped to you from [facility]- they will call you just prior to receipt of the monitor or when you actually receive the monitor.  If you get a call from an 800# or a 224 area code in the next 2-5 business days, then please answer the call.    Follow-Up: At [facility], you and your health needs are our priority.  As part of our continuing mission to provide you with exceptional heart care, we have created designated  Provider Care Teams.  These Care Teams include your primary Cardiologist (physician) and Advanced Practice Providers (APPs -  Physician Assistants and Nurse Practitioners) who all work together to provide you with the care you need, when you need it.  Your next appointment:   2 month(s)  The format for your next appointment:   Either In Person or Virtual  Provider:    You may see DR Harrell Gave END or one of the following Advanced Practice Providers on your designated Care Team:    Murray Hodgkins, NP  Christell Faith, PA-C  Marrianne Mood, PA-C

## 2019-10-27 NOTE — Progress Notes (Addendum)
Follow-up Outpatient Visit Date: 10/27/2019  Primary Care Provider: McLean-Scocuzza, Nino Glow, MD [place] [address]  Chief Complaint: Palpitations and chest pain  HPI:  Ms. Nienhuis is a 73 y.o. female with history of hypertension, hyperlipidemia, right breast cancer status post lumpectomy and radiation, hypothyroidism, and esophagitis, who presents for follow-up of chest pain.  I met her in late October, at which time Ms. Courter complained of intermittent chest heaviness as well as palpitations.  Subsequent cardiac CTA showed no significant CAD.  Incidental note of a new right lower lobe subpleural nodule was made.  Dedicated chest CT ordered by her PCP is pending for next week.  Today, Ms. Aramburo reports that she feels about the same as at our last visit.  She still has almost daily chest heaviness accompanied by palpitations.  This most often occurs later in the day when she sits or lies down.  She does not have any significant dyspnea.  She felt as though her palpitations may have improved a little bit with metoprolol, though she did not tolerate this medication due to gas and upset stomach.  She notes that multiple medications in the past have upset her stomach, including amlodipine and omeprazole.  Ms. Wimbish is scheduled for cataract surgery later this month at [facility] and will be spending several weeks with her parents in [place].  --------------------------------------------------------------------------------------------------  Cardiovascular History & Procedures: Cardiovascular Problems:  Chest pain  Shortness of breath  Palpitations  Risk Factors:  Hypertension, hyperlipidemia, age greater than 2  Cath/PCI:  None available.  Patient reports clean catheterization 10 to 15 years ago in [place], [place].  CV Surgery:  None  EP Procedures and Devices:  None  Non-Invasive Evaluation(s):  Cardiac CTA (10/13/2019): No significant CAD.  CAC score  = 0.  Incidental note made of incompletely imaged subpleural nodule in the right lower lobe.  TTE (10/10/2018): Mild focal basal hypertrophy of the septum.  Normal LV size with LVEF 65%.  Grade 1 diastolic dysfunction. Mild MR and TR.  Mild LAE.  Patient reports abnormal stress test attributed to breast attenuation 10 to 15 years ago in [place], [place].  Recent CV Pertinent Labs: Lab Results  Component Value Date   CHOL 241 (H) 09/10/2019   HDL 82 09/10/2019   LDLCALC 138 (H) 09/10/2019   TRIG 106 09/10/2019   CHOLHDL 2.9 09/10/2019   K 4.1 09/10/2019   MG 2.1 10/10/2018   BUN 16 09/10/2019   CREATININE 0.57 (L) 09/10/2019    Past medical and surgical history were reviewed and updated in EPIC.  Current Meds  Medication Sig  . amoxicillin-clavulanate (AUGMENTIN) 875-125 MG tablet Take 1 tablet by mouth 2 (two) times daily. With food stop cipro  . aspirin EC 81 MG tablet Take 1 tablet (81 mg total) by mouth daily.  Marland Kitchen atorvastatin (LIPITOR) 10 MG tablet Take 1 tablet (10 mg total) by mouth every other day. Note 10 mg no need to cut in 1/2 as before you have 20 mg taking 1/2 pill =10 mg  . Cholecalciferol (VITAMIN D3 PO) Take 1 capsule by mouth daily.  . fluticasone (FLONASE) 50 MCG/ACT nasal spray Place 2 sprays into both nostrils daily. Max b/l nostrils  . levothyroxine (SYNTHROID) 88 MCG tablet Take 1 tablet (88 mcg total) by mouth daily before breakfast. 30 minutes  . lisinopril (ZESTRIL) 30 MG tablet Take 1 tablet (30 mg total) by mouth daily.  . Multiple Vitamin (MULTI-VITAMINS) TABS Take 1 tablet by mouth  daily.   . nitroGLYCERIN (NITROSTAT) 0.4 MG SL tablet Place 1 tablet (0.4 mg total) under the tongue every 5 (five) minutes as needed for chest pain.  Marland Kitchen zolpidem (AMBIEN) 5 MG tablet Take 1 tablet (5 mg total) by mouth at bedtime as needed for sleep.    Allergies: Sulfa antibiotics and Norvasc [amlodipine besylate]  Social History   Tobacco Use  . Smoking status: Never  Smoker  . Smokeless tobacco: Never Used  Substance Use Topics  . Alcohol use: Not Currently    Alcohol/week: 7.0 standard drinks    Types: 7 Glasses of wine per week  . Drug use: No    Family History  Problem Relation Age of Onset  . Arthritis Mother   . Heart disease Mother 86  . Hyperlipidemia Mother   . Hypertension Mother   . Coronary artery disease Mother 80  . Arthritis Father   . Diabetes Father   . Cancer Father        colon  . AAA (abdominal aortic aneurysm) Father   . Arthritis Sister   . Hyperlipidemia Sister   . Hypertension Sister   . Cancer Brother        lung, smoker  . Mental retardation Brother   . Drug abuse Daughter        overdose in 2018   . Arthritis Sister   . Hyperlipidemia Sister   . Bladder Cancer Neg Hx   . Kidney cancer Neg Hx     Review of Systems: A 12-system review of systems was performed and was negative except as noted in the HPI.  --------------------------------------------------------------------------------------------------  Physical Exam: BP (!) 148/80 (BP Location: Left Arm, Patient Position: Sitting, Cuff Size: Normal)   Pulse 88   Ht '4\' 11"'$  (1.499 m)   Wt 129 lb 8 oz (58.7 kg)   SpO2 99%   BMI 26.16 kg/m   General: NAD. HEENT: No conjunctival pallor or scleral icterus. Facemask in place. Neck: Supple without lymphadenopathy, thyromegaly, JVD, or HJR. Lungs: Normal work of breathing. Clear to auscultation bilaterally without wheezes or crackles. Heart: Regular rate and rhythm without murmurs, rubs, or gallops. Non-displaced PMI. Abd: Bowel sounds present. Soft, NT/ND without hepatosplenomegaly Ext: No lower extremity edema. Skin: Warm and dry without rash.  EKG: Normal sinus rhythm with nonspecific ST segment changes.  Lab Results  Component Value Date   WBC 5.8 09/10/2019   HGB 13.4 09/10/2019   HCT 40.2 09/10/2019   MCV 87.4 09/10/2019   PLT 250 09/10/2019    Lab Results  Component Value Date   NA 137  09/10/2019   K 4.1 09/10/2019   CL 99 09/10/2019   CO2 26 09/10/2019   BUN 16 09/10/2019   CREATININE 0.57 (L) 09/10/2019   GLUCOSE 90 09/10/2019   ALT 15 09/10/2019    Lab Results  Component Value Date   CHOL 241 (H) 09/10/2019   HDL 82 09/10/2019   LDLCALC 138 (H) 09/10/2019   TRIG 106 09/10/2019   CHOLHDL 2.9 09/10/2019    --------------------------------------------------------------------------------------------------  ASSESSMENT AND PLAN: Atypical chest pain and palpitations: Chest pain is unchanged since our last visit and seems to be worsened by sitting/lying down towards the end of the day.  I wonder if there could be a component of GERD.  Recent CTA of the chest showed no significant CAD.  Given associated palpitations, I have recommended that we obtain a 14-day event monitor for further assessment.  I have also recommended an empiric trial of  famotidine 20 mg twice daily, as Ms. Dehne did not tolerate omeprazole in the past.  If symptoms persist an event monitor is unrevealing, it may be worthwhile to pursue GI consultation.  Hypertension: Blood pressure suboptimally controlled today.  We will increase lisinopril to 40 mg daily and obtain a basic metabolic panel in about 1 week.  Follow-up: Return visit (virtual or in person) in 2 months.  [name], MD 10/27/2019 8:19 AM

## 2019-10-29 ENCOUNTER — Ambulatory Visit: Payer: Medicare Other | Admitting: Internal Medicine

## 2019-11-01 ENCOUNTER — Other Ambulatory Visit
Admission: RE | Admit: 2019-11-01 | Discharge: 2019-11-01 | Disposition: A | Discharge status: Home / Self Care | Payer: Medicare Other | Source: Ambulatory Visit | Attending: Internal Medicine | Admitting: Internal Medicine

## 2019-11-01 ENCOUNTER — Other Ambulatory Visit: Payer: Self-pay

## 2019-11-01 ENCOUNTER — Ambulatory Visit
Admission: RE | Admit: 2019-11-01 | Discharge: 2019-11-01 | Disposition: A | Discharge status: Home / Self Care | Payer: Medicare Other | Source: Ambulatory Visit | Attending: Internal Medicine | Admitting: Internal Medicine

## 2019-11-01 DIAGNOSIS — R918 Other nonspecific abnormal finding of lung field: Secondary | ICD-10-CM

## 2019-11-01 DIAGNOSIS — R931 Abnormal findings on diagnostic imaging of heart and coronary circulation: Secondary | ICD-10-CM | POA: Insufficient documentation

## 2019-11-01 DIAGNOSIS — R911 Solitary pulmonary nodule: Secondary | ICD-10-CM | POA: Insufficient documentation

## 2019-11-01 DIAGNOSIS — Z9889 Other specified postprocedural states: Secondary | ICD-10-CM | POA: Insufficient documentation

## 2019-11-01 LAB — BASIC METABOLIC PANEL
Anion gap: 8 (ref 5–15)
BUN: 23 mg/dL (ref 8–23)
CO2: 26 mmol/L (ref 22–32)
Calcium: 9.1 mg/dL (ref 8.9–10.3)
Chloride: 104 mmol/L (ref 98–111)
Creatinine, Ser: 0.51 mg/dL (ref 0.44–1.00)
GFR calc Af Amer: >60 mL/min (ref >60)
GFR calc non Af Amer: >60 mL/min (ref >60)
Glucose, Bld: 99 mg/dL (ref 70–99)
Potassium: 3.7 mmol/L (ref 3.5–5.1)
Sodium: 138 mmol/L (ref 135–145)

## 2019-11-01 IMAGING — CT CT CHEST W/O CM
2 of 4 series · 15 of 36 positions shown, 18 images · non-contrast
Comparison: Correlation made with cardiac CT [DATE]

CLINICAL DATA: Follow-up lung nodule

EXAM:
CT CHEST WITHOUT CONTRAST
TECHNIQUE: Multidetector CT imaging of the chest was performed following the
standard protocol without IV contrast.

[Series 2: chest (person_name) 2.00 · axial · 0.57mm/px · z∈[-1151,-909]mm · 12 of 145 slices shown, 15 images]
[im 12/145  mediastinal]
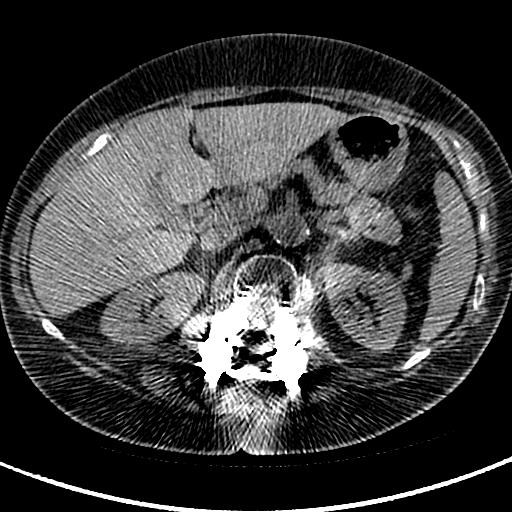
[im 12/145  lung]
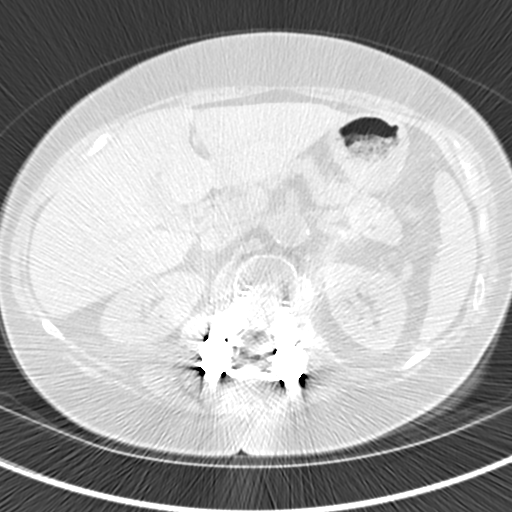
[im 23/145  lung]
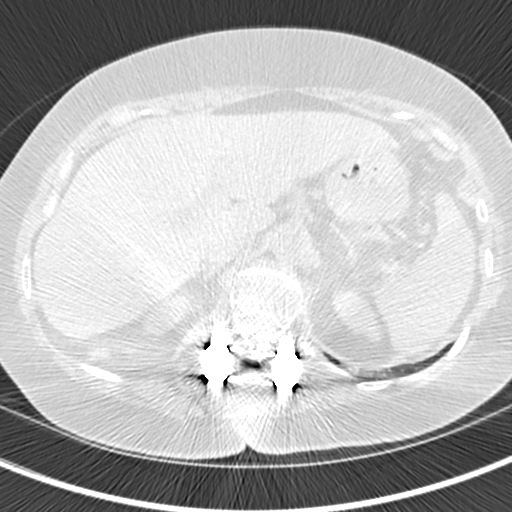
[im 34/145  lung]
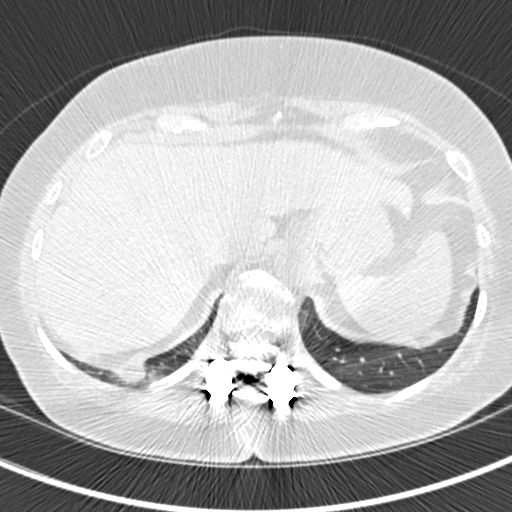
[im 45/145  lung]
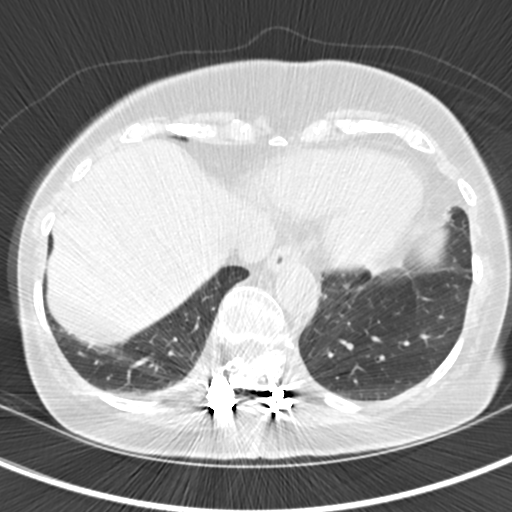
[im 56/145  mediastinal]
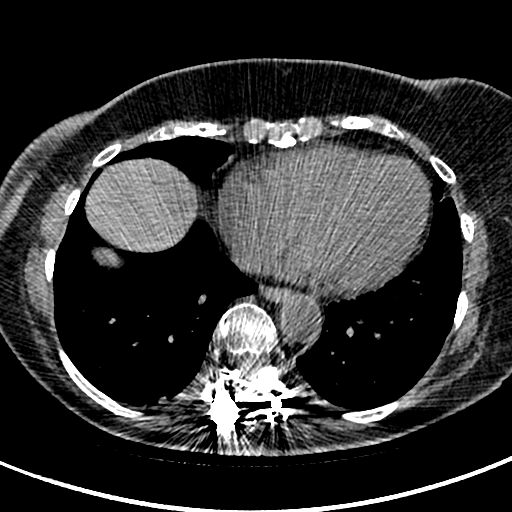
[im 56/145  lung]
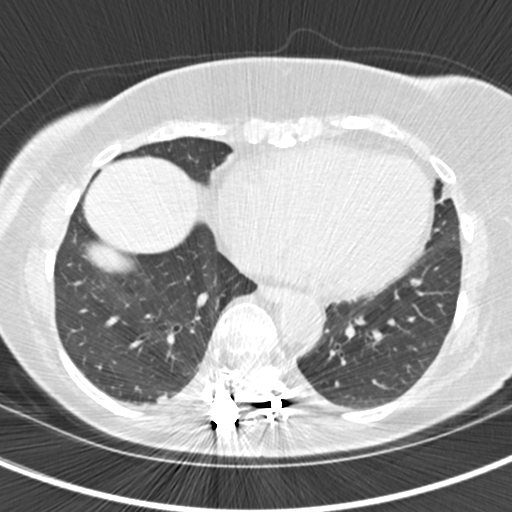
[im 67/145  lung]
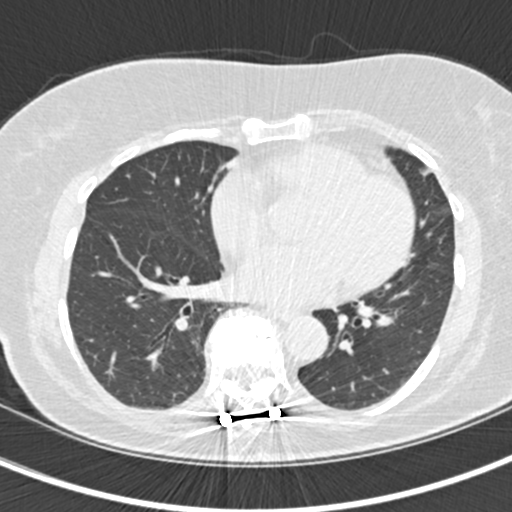
[im 78/145  lung]
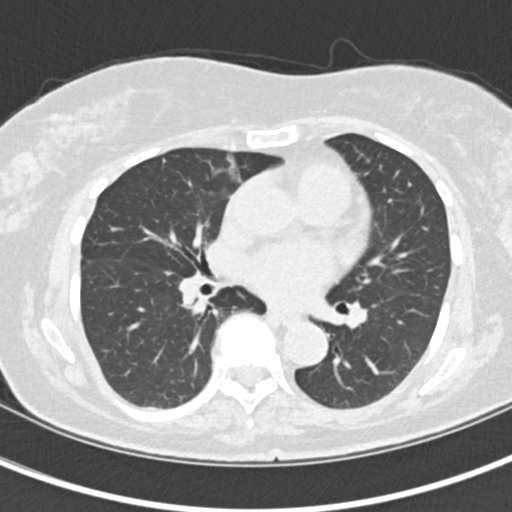
[im 89/145  lung]
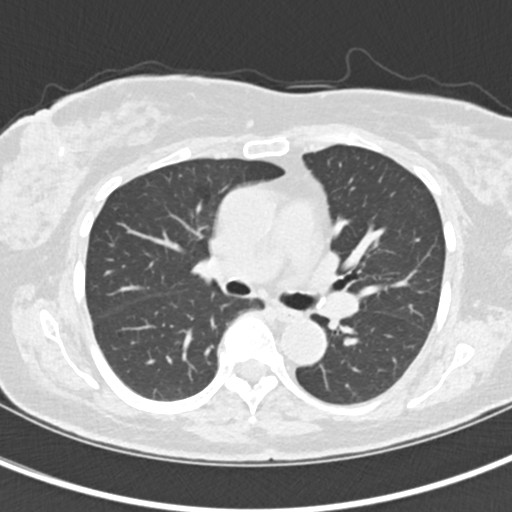
[im 100/145  mediastinal]
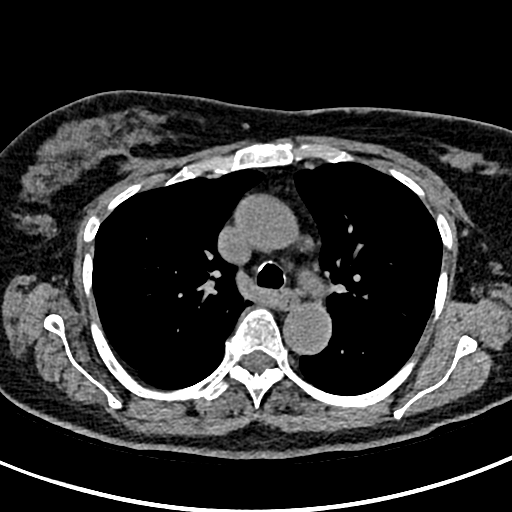
[im 100/145  lung]
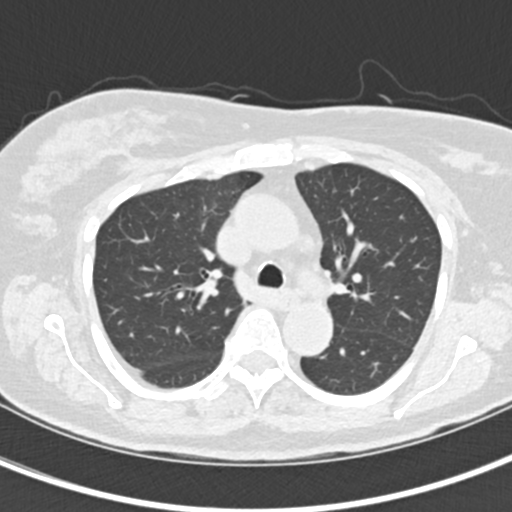
[im 111/145  lung]
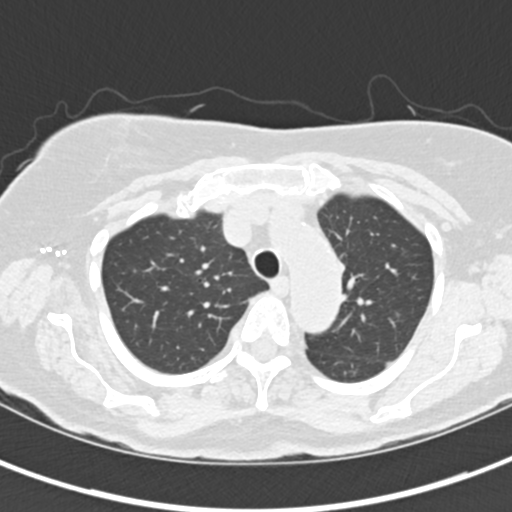
[im 122/145  lung]
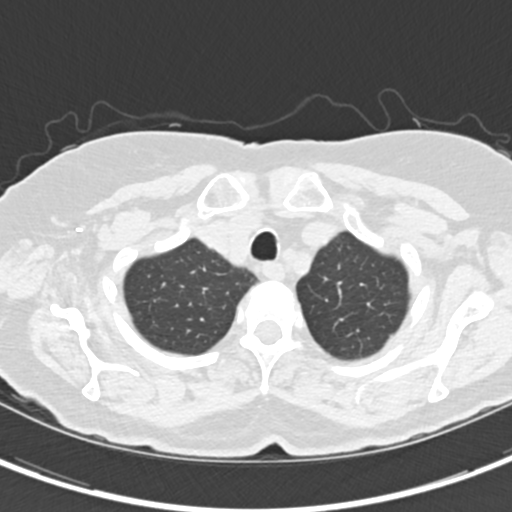
[im 133/145  lung]
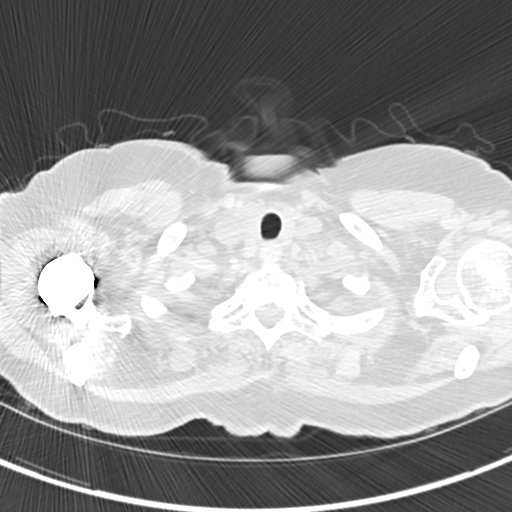

[Series 5: coronals chest (person_name) 2.00 cor · coronal · 0.57mm/px · 3 of 123 slices shown]
[im 25/123  lung]
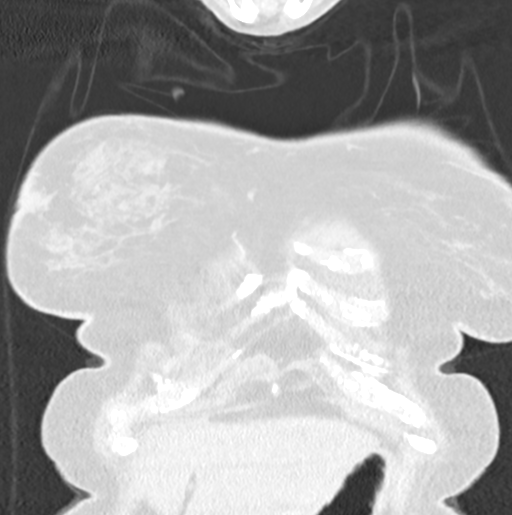
[im 49/123  lung]
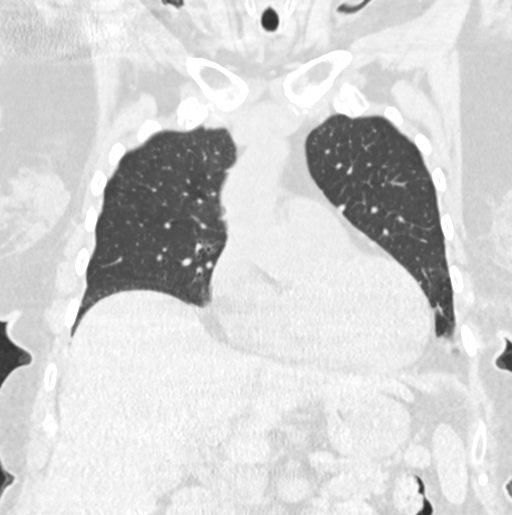
[im 74/123  lung]
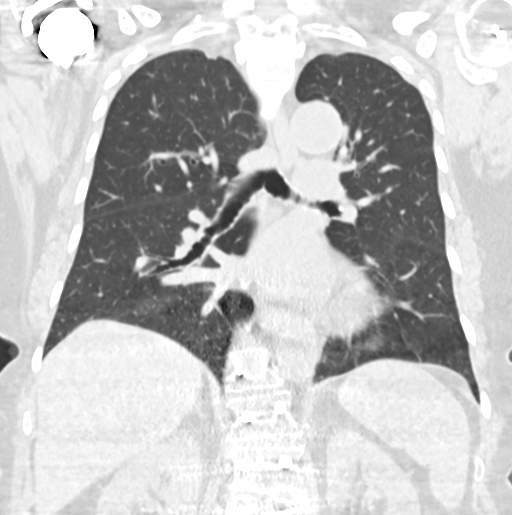

[15 of 36 positions shown; findings below may reference images not displayed]

FINDINGS: Cardiovascular: Minimal calcified along the thoracic aorta. Normal
heart size. No pericardial effusion.

Mediastinum/Nodes: No enlarged lymph nodes. Thyroid is unremarkable.

Lungs/Pleura: Stable 4 mm peripheral right lower lobe, which is
considered benign. Questioned rounded subpleural nodule at the right
lung base corresponds to a small fat containing Bochdalek hernia.
Minimal bilateral scarring/atelectasis. No pleural effusion.

Upper Abdomen: No acute abnormality.

Musculoskeletal: Partially imaged thoracolumbar fusion construct and
right shoulder arthroplasty with associated streak artifact.
IMPRESSION: Questioned right lung base nodule on prior study corresponds to a
small fat containing Bochdalek hernia. No new findings.

## 2019-11-04 DIAGNOSIS — Z01818 Encounter for other preprocedural examination: Secondary | ICD-10-CM | POA: Diagnosis not present

## 2019-11-15 DIAGNOSIS — H2511 Age-related nuclear cataract, right eye: Secondary | ICD-10-CM | POA: Diagnosis not present

## 2019-11-20 ENCOUNTER — Ambulatory Visit (INDEPENDENT_AMBULATORY_CARE_PROVIDER_SITE_OTHER): Payer: Medicare Other

## 2019-11-20 DIAGNOSIS — R002 Palpitations: Secondary | ICD-10-CM | POA: Diagnosis not present

## 2019-11-30 ENCOUNTER — Telehealth: Payer: Self-pay | Admitting: Internal Medicine

## 2019-11-30 NOTE — Telephone Encounter (Signed)
Pt called about needing a refill for zolpidem (AMBIEN) 5 MG tablet no refills avail. Pt has a appt scheduled for 01/14/20. Please advise and Thank you!  Pharmacy is [facility] [place]  Call pt @ [phone]

## 2019-12-01 ENCOUNTER — Other Ambulatory Visit: Payer: Self-pay | Admitting: Internal Medicine

## 2019-12-01 ENCOUNTER — Other Ambulatory Visit: Payer: Self-pay

## 2019-12-01 DIAGNOSIS — G4700 Insomnia, unspecified: Secondary | ICD-10-CM

## 2019-12-01 MED ORDER — ZOLPIDEM TARTRATE 5 MG PO TABS: 5.0000 mg | ORAL_TABLET | Freq: Every evening | ORAL | 1 refills | Status: DC | PRN

## 2019-12-01 MED ORDER — LISINOPRIL 40 MG PO TABS: 40.0000 mg | ORAL_TABLET | Freq: Every day | ORAL | 0 refills | Status: DC

## 2019-12-09 ENCOUNTER — Telehealth: Payer: Self-pay | Admitting: Internal Medicine

## 2019-12-09 NOTE — Telephone Encounter (Signed)
instruct patient to see UC? Please advise.

## 2019-12-09 NOTE — Telephone Encounter (Signed)
Pt thinks she has another UTI. No appointments at this time are available with any provider.

## 2019-12-09 NOTE — Telephone Encounter (Signed)
Does [facility] have anything?  Or urology  -recurrent UTI  She really needs to see urology    [place]

## 2019-12-10 NOTE — Telephone Encounter (Signed)
Patient has appointment Monday @ [place].

## 2019-12-10 NOTE — Telephone Encounter (Signed)
Please advise patient.  

## 2019-12-13 ENCOUNTER — Ambulatory Visit: Payer: Medicare Other | Admitting: Family Medicine

## 2019-12-13 DIAGNOSIS — R002 Palpitations: Secondary | ICD-10-CM | POA: Diagnosis not present

## 2019-12-13 DIAGNOSIS — M4327 Fusion of spine, lumbosacral region: Secondary | ICD-10-CM | POA: Diagnosis not present

## 2019-12-13 DIAGNOSIS — M4727 Other spondylosis with radiculopathy, lumbosacral region: Secondary | ICD-10-CM | POA: Insufficient documentation

## 2019-12-17 ENCOUNTER — Telehealth: Payer: Self-pay

## 2019-12-17 NOTE — Telephone Encounter (Signed)
Call to patient to discuss zio results. No further questions or orders at this time. She did request that I cancel upcoming feb appt because she has another appt for 2nd covid shot. She will return call to the office to reschedule.   Advised pt to call for any further questions or concerns.

## 2019-12-17 NOTE — Telephone Encounter (Signed)
-----   Message from [name], MD sent at 12/17/2019 11:07 AM EST ----- Please let [name] know that her event monitor showed rare extra beats but no significant arrhythmia to explain her symptoms.  I do not recommend further testing at this time.  We will follow-up as previously discussed.

## 2019-12-23 DIAGNOSIS — M419 Scoliosis, unspecified: Secondary | ICD-10-CM | POA: Diagnosis not present

## 2019-12-23 DIAGNOSIS — M8580 Other specified disorders of bone density and structure, unspecified site: Secondary | ICD-10-CM | POA: Diagnosis not present

## 2019-12-23 DIAGNOSIS — M519 Unspecified thoracic, thoracolumbar and lumbosacral intervertebral disc disorder: Secondary | ICD-10-CM | POA: Diagnosis not present

## 2019-12-23 DIAGNOSIS — M2170 Unequal limb length (acquired), unspecified site: Secondary | ICD-10-CM | POA: Diagnosis not present

## 2019-12-23 DIAGNOSIS — M4807 Spinal stenosis, lumbosacral region: Secondary | ICD-10-CM | POA: Diagnosis not present

## 2019-12-23 DIAGNOSIS — M818 Other osteoporosis without current pathological fracture: Secondary | ICD-10-CM | POA: Diagnosis not present

## 2019-12-23 DIAGNOSIS — H43813 Vitreous degeneration, bilateral: Secondary | ICD-10-CM | POA: Diagnosis not present

## 2019-12-24 ENCOUNTER — Other Ambulatory Visit: Payer: Self-pay

## 2019-12-24 ENCOUNTER — Ambulatory Visit
Admission: RE | Admit: 2019-12-24 | Discharge: 2019-12-24 | Disposition: A | Discharge status: Home / Self Care | Payer: Medicare Other | Source: Ambulatory Visit | Attending: Neurosurgery | Admitting: Neurosurgery

## 2019-12-24 ENCOUNTER — Other Ambulatory Visit: Payer: Self-pay | Admitting: Neurosurgery

## 2019-12-24 ENCOUNTER — Ambulatory Visit
Admission: RE | Admit: 2019-12-24 | Discharge: 2019-12-24 | Disposition: A | Discharge status: Home / Self Care | Payer: Medicare Other | Attending: Neurosurgery | Admitting: Neurosurgery

## 2019-12-24 DIAGNOSIS — M2170 Unequal limb length (acquired), unspecified site: Secondary | ICD-10-CM

## 2019-12-24 DIAGNOSIS — M4184 Other forms of scoliosis, thoracic region: Secondary | ICD-10-CM | POA: Diagnosis not present

## 2019-12-24 DIAGNOSIS — M419 Scoliosis, unspecified: Secondary | ICD-10-CM

## 2019-12-24 DIAGNOSIS — M955 Acquired deformity of pelvis: Secondary | ICD-10-CM | POA: Diagnosis not present

## 2019-12-24 DIAGNOSIS — M1712 Unilateral primary osteoarthritis, left knee: Secondary | ICD-10-CM | POA: Diagnosis not present

## 2019-12-24 DIAGNOSIS — M810 Age-related osteoporosis without current pathological fracture: Secondary | ICD-10-CM | POA: Diagnosis not present

## 2019-12-24 IMAGING — CR DG BONE LENGTH
1 series · 4 of 4 positions shown · non-contrast
Comparison: None.

CLINICAL DATA: Leg length discrepancy.  Scoliosis.

EXAM:
BONE LENGTH

[Series 1: long bone ap · 0.14mm/px · 4 of 4 slices shown]
[im 1/4]
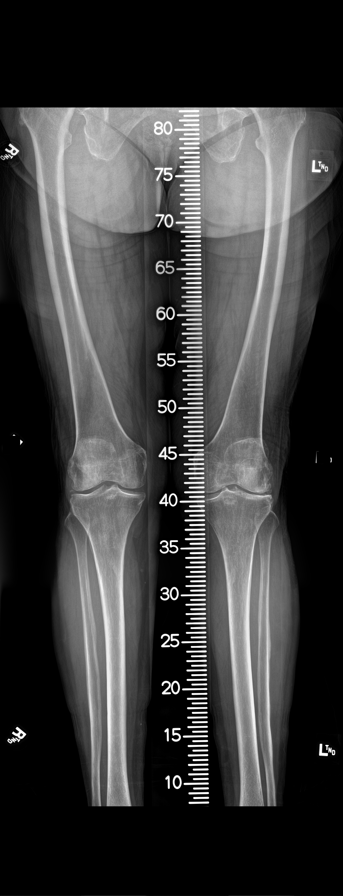
[im 2/4]
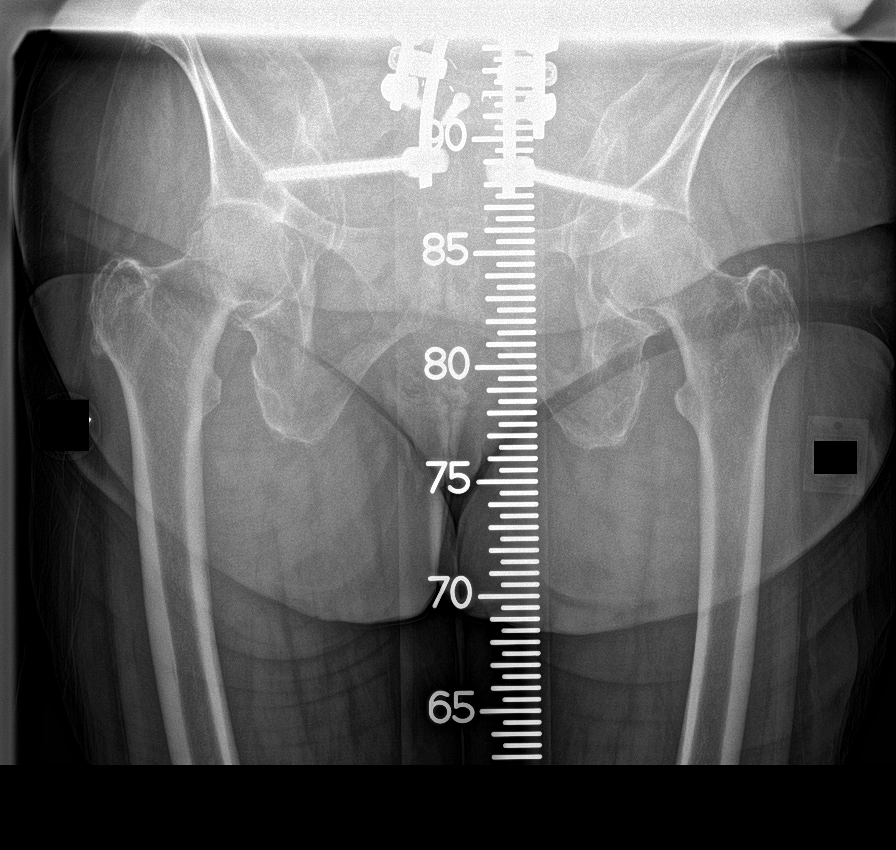
[im 3/4]
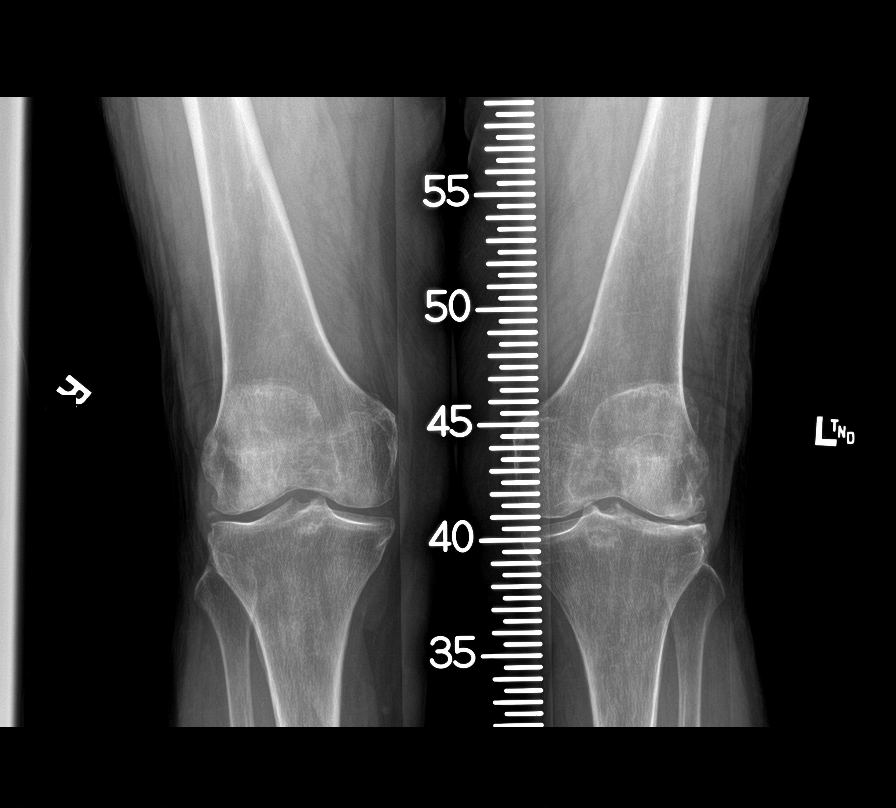
[im 4/4]
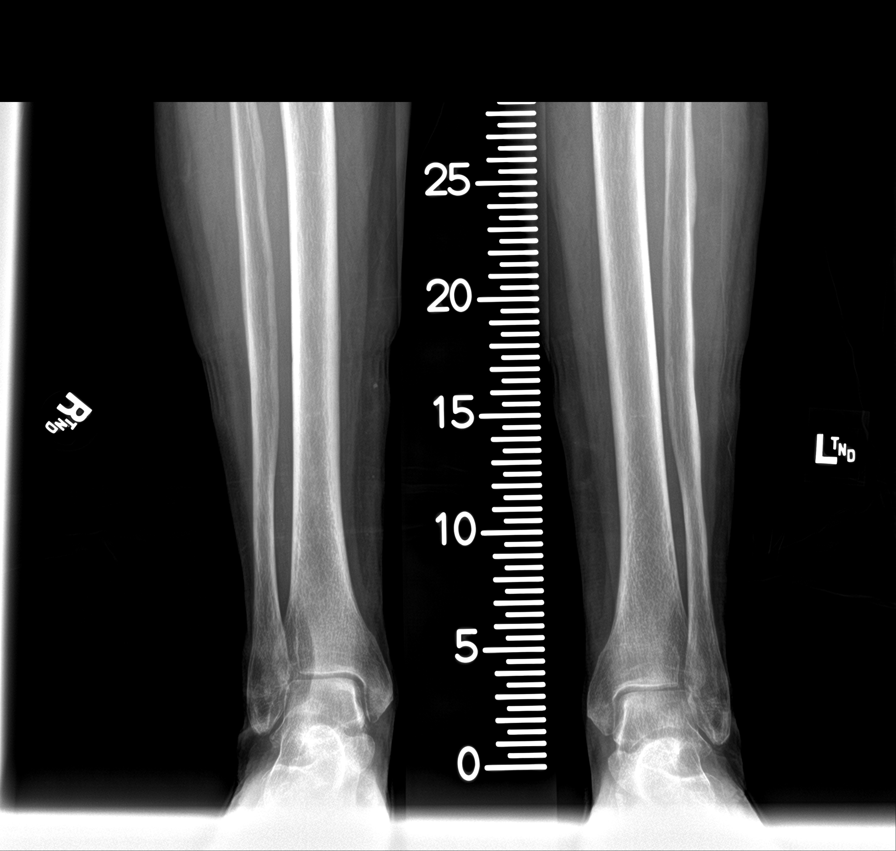

[4 of 4 positions shown; findings below may reference images not displayed]

FINDINGS: The femurs are of the same length, 50 cm bilaterally. The right
tibia is shorter than the left tibia by 3 mm. Asymmetric
degenerative changes are noted in the left knee.
IMPRESSION: 1. Leg length discrepancy.
2. The right leg is 3 mm longer than the left, entirely in the
tibia.
3. Left pelvic downward tilt.

## 2019-12-24 IMAGING — CR DG SCOLIOSIS EVAL COMPLETE SPINE 2-3V
2 series · 6 of 6 positions shown · non-contrast
Comparison: In [DZ]

CLINICAL DATA: Acquired scoliosis, surgery

EXAM:
DG SCOLIOSIS EVAL COMPLETE SPINE 2-3V

[Series 1: whole body ap · 0.14mm/px · 3 of 3 slices shown]
[im 1/3]
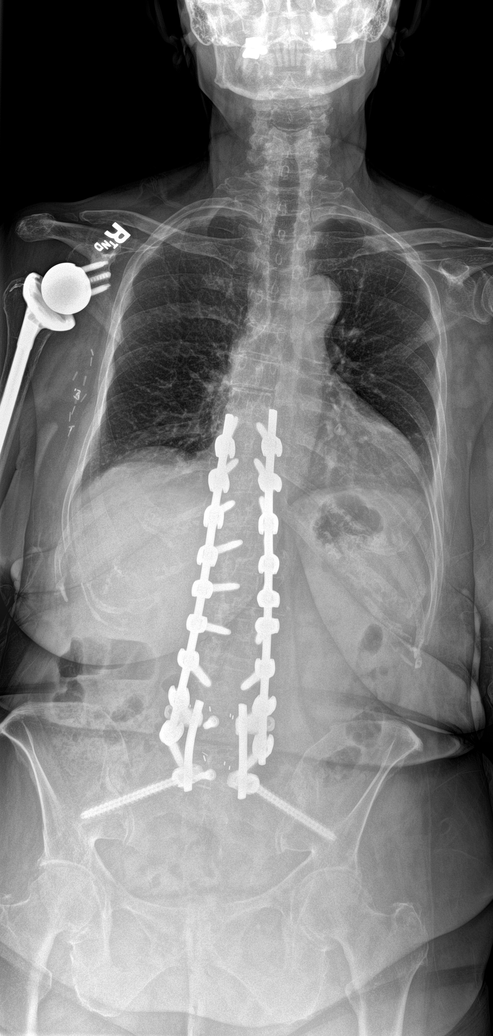
[im 2/3]
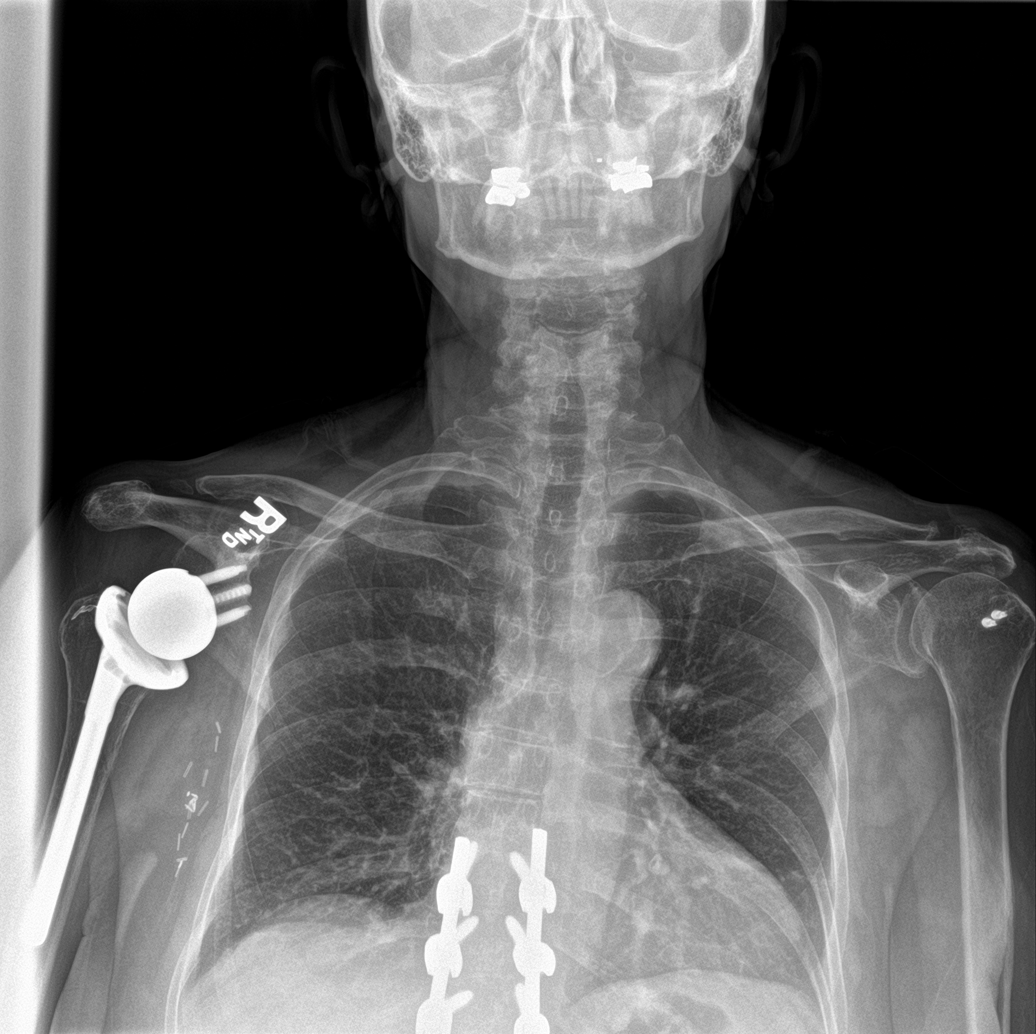
[im 3/3]
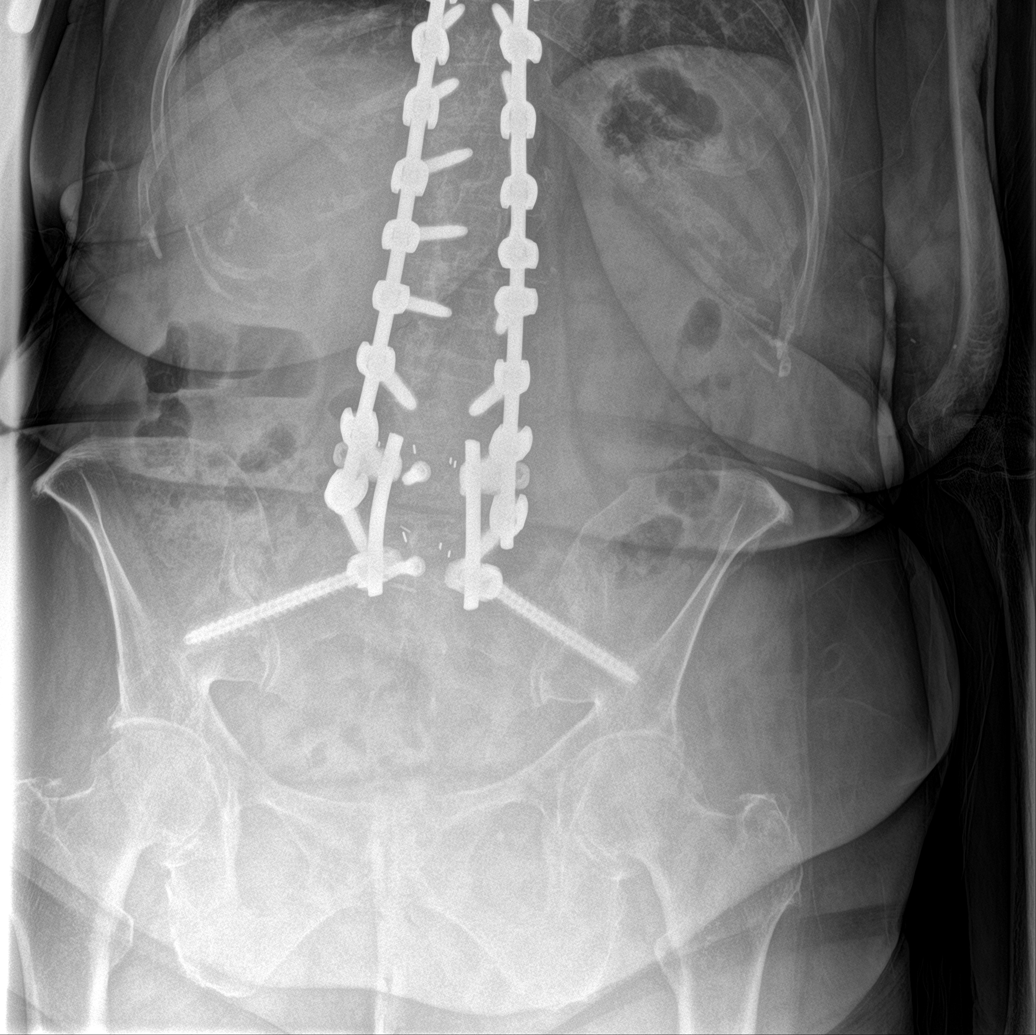

[Series 2: whole body lat · 0.14mm/px · 3 of 3 slices shown]
[im 1/3]
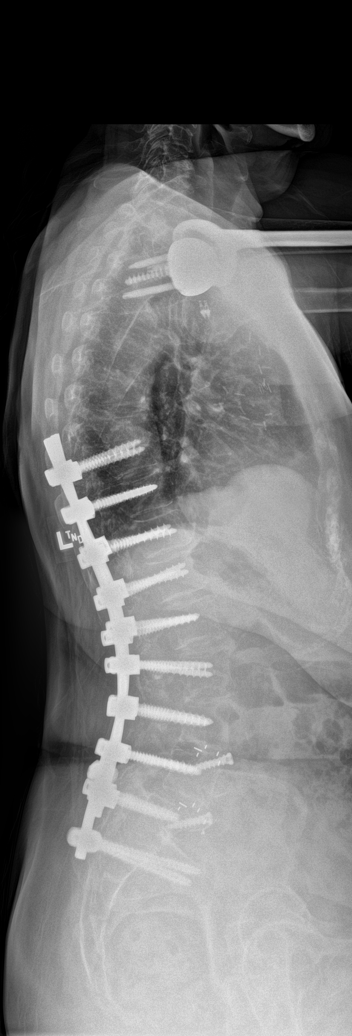
[im 2/3]
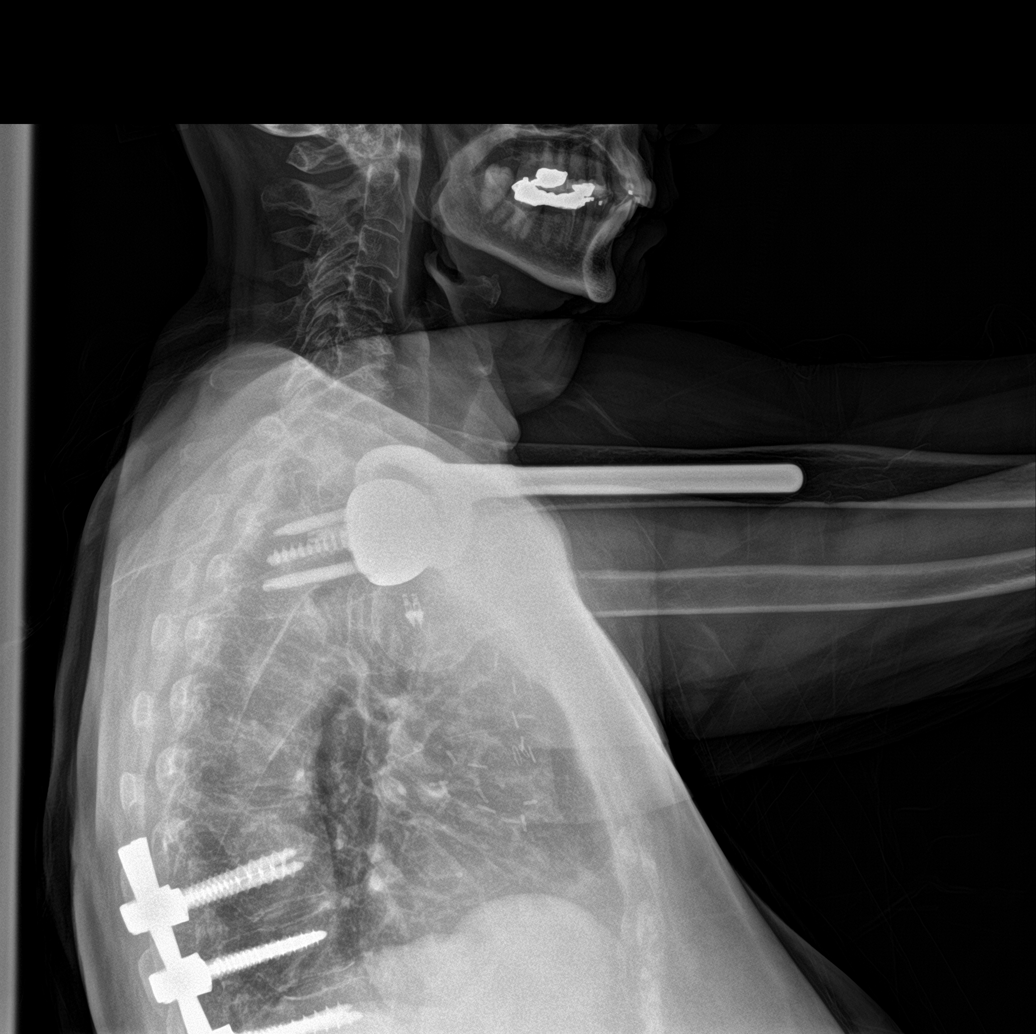
[im 3/3]
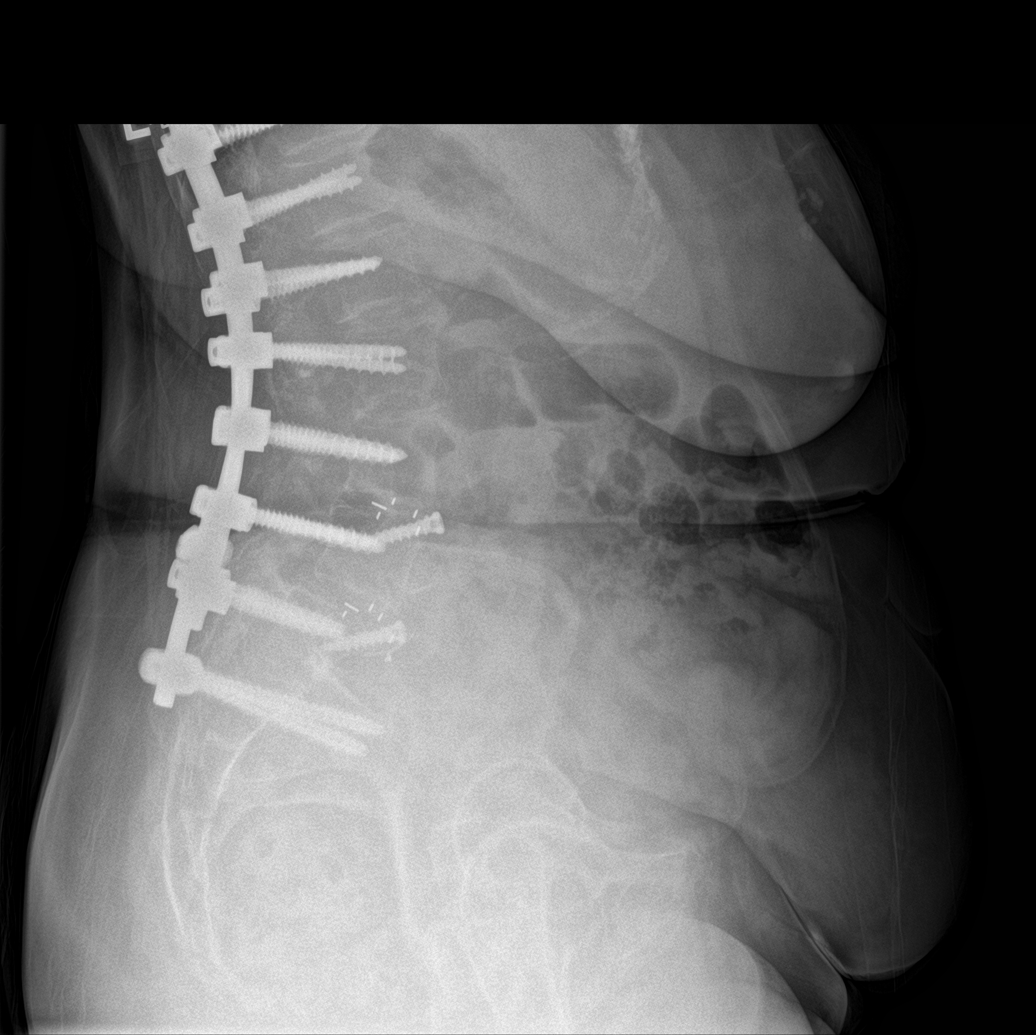

[6 of 6 positions shown; findings below may reference images not displayed]

FINDINGS: Osseous demineralization.

Eleven pairs of ribs.

She spinal fixation rods and BILATERAL pedicle screws extend from T9
through sacrum.

Screws traverse the SI joints bilaterally.

Vertebral body heights maintained without fracture or subluxation.

Minimal levoconvex upper thoracic scoliosis measured at 5 degrees
superior T2 to superior T8.

No vertebral fractures identified.

Disc prostheses at adjacent levels of the lower lumbar spine.

Narrowing of the hip joints bilaterally.

Atherosclerotic calcification aorta.
IMPRESSION: Minimal levoconvex upper thoracic scoliosis of 5 degrees from T2-T8.

Prior thoracolumbar spinal fixation.

Osseous demineralization without acute bony abnormalities.

## 2019-12-28 ENCOUNTER — Other Ambulatory Visit: Payer: Self-pay | Admitting: Neurosurgery

## 2019-12-28 DIAGNOSIS — M419 Scoliosis, unspecified: Secondary | ICD-10-CM

## 2019-12-28 DIAGNOSIS — M4807 Spinal stenosis, lumbosacral region: Secondary | ICD-10-CM

## 2019-12-28 DIAGNOSIS — M519 Unspecified thoracic, thoracolumbar and lumbosacral intervertebral disc disorder: Secondary | ICD-10-CM

## 2019-12-31 DIAGNOSIS — M419 Scoliosis, unspecified: Secondary | ICD-10-CM | POA: Diagnosis not present

## 2019-12-31 DIAGNOSIS — M545 Low back pain: Secondary | ICD-10-CM | POA: Diagnosis not present

## 2019-12-31 DIAGNOSIS — M519 Unspecified thoracic, thoracolumbar and lumbosacral intervertebral disc disorder: Secondary | ICD-10-CM | POA: Diagnosis not present

## 2019-12-31 DIAGNOSIS — M546 Pain in thoracic spine: Secondary | ICD-10-CM | POA: Diagnosis not present

## 2020-01-05 ENCOUNTER — Ambulatory Visit
Admission: RE | Admit: 2020-01-05 | Discharge: 2020-01-05 | Disposition: A | Discharge status: Home / Self Care | Payer: Medicare Other | Source: Ambulatory Visit | Attending: Neurosurgery | Admitting: Neurosurgery

## 2020-01-05 ENCOUNTER — Other Ambulatory Visit: Payer: Self-pay

## 2020-01-05 DIAGNOSIS — M419 Scoliosis, unspecified: Secondary | ICD-10-CM

## 2020-01-05 DIAGNOSIS — M545 Low back pain: Secondary | ICD-10-CM | POA: Diagnosis not present

## 2020-01-05 DIAGNOSIS — M519 Unspecified thoracic, thoracolumbar and lumbosacral intervertebral disc disorder: Secondary | ICD-10-CM

## 2020-01-05 DIAGNOSIS — G8929 Other chronic pain: Secondary | ICD-10-CM | POA: Diagnosis not present

## 2020-01-05 DIAGNOSIS — Z981 Arthrodesis status: Secondary | ICD-10-CM | POA: Diagnosis not present

## 2020-01-05 DIAGNOSIS — M4807 Spinal stenosis, lumbosacral region: Secondary | ICD-10-CM

## 2020-01-05 DIAGNOSIS — M5124 Other intervertebral disc displacement, thoracic region: Secondary | ICD-10-CM | POA: Diagnosis not present

## 2020-01-05 DIAGNOSIS — M546 Pain in thoracic spine: Secondary | ICD-10-CM | POA: Diagnosis not present

## 2020-01-05 LAB — CBC WITH DIFFERENTIAL/PLATELET
Abs Immature Granulocytes: 0.01 10*3/uL (ref 0.00–0.07)
Basophils Absolute: 0 10*3/uL (ref 0.0–0.1)
Basophils Relative: 1 %
Eosinophils Absolute: 0.2 10*3/uL (ref 0.0–0.5)
Eosinophils Relative: 4 %
HCT: 43.1 % (ref 36.0–46.0)
Hemoglobin: 13.7 g/dL (ref 12.0–15.0)
Immature Granulocytes: 0 %
Lymphocytes Relative: 20 %
Lymphs Abs: 1 10*3/uL (ref 0.7–4.0)
MCH: 28 pg (ref 26.0–34.0)
MCHC: 31.8 g/dL (ref 30.0–36.0)
MCV: 88 fL (ref 80.0–100.0)
Monocytes Absolute: 0.3 10*3/uL (ref 0.1–1.0)
Monocytes Relative: 6 %
Neutro Abs: 3.3 10*3/uL (ref 1.7–7.7)
Neutrophils Relative %: 69 %
Platelets: 211 10*3/uL (ref 150–400)
RBC: 4.9 MIL/uL (ref 3.87–5.11)
RDW: 13.6 % (ref 11.5–15.5)
WBC: 4.8 10*3/uL (ref 4.0–10.5)
nRBC: 0 % (ref 0.0–0.2)

## 2020-01-05 LAB — PROTIME-INR
INR: 0.9 (ref 0.8–1.2)
Prothrombin Time: 11.9 seconds (ref 11.4–15.2)

## 2020-01-05 LAB — APTT: aPTT: 27 seconds (ref 24–36)

## 2020-01-05 IMAGING — CT CT T SPINE W/ CM
3 series · 9 of 33 positions shown, 10 images · non-contrast
Comparison: [DATE] and previous

CLINICAL DATA: Back pain, scoliosis, postop. History of breast
carcinoma.

EXAM:
CT MYELOGRAPHY THORACIC SPINE
CT MYELOGRAPHY LUMBAR SPINE
TECHNIQUE: CT imaging of the lumbar and thoracic spine was performed after
intrathecal contrast administration by radiologist Dr. ABDULGHAFOR.
Multiplanar CT image reconstructions were also generated.

[Series 4: t spine soft (person_name) · axial · 0.28mm/px · z∈[-175,-175]mm · 1 of 127 slices shown, 2 images]
[im 68/127  soft-tissue]
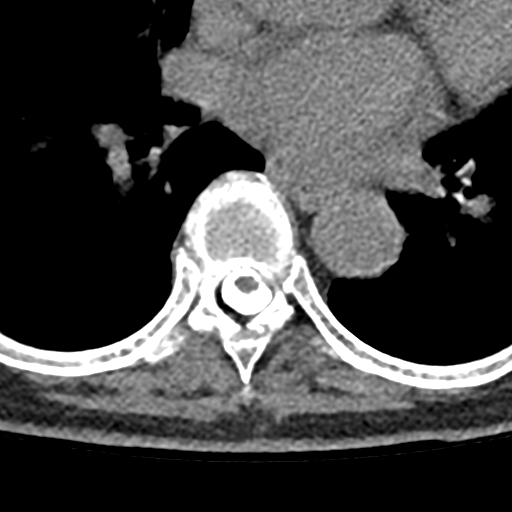
[im 68/127  bone]
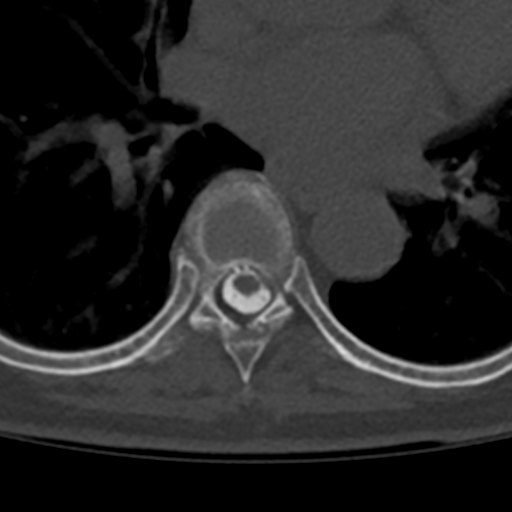

[Series 6: sagittal bone · sagittal · 0.26mm/px · 5 of 55 slices shown]
[im 19/55  bone]
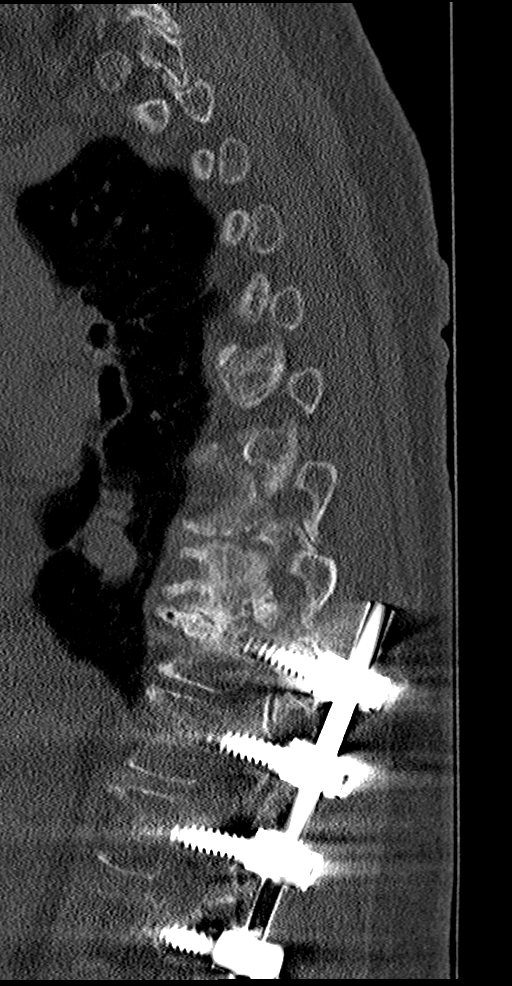
[im 23/55  bone]
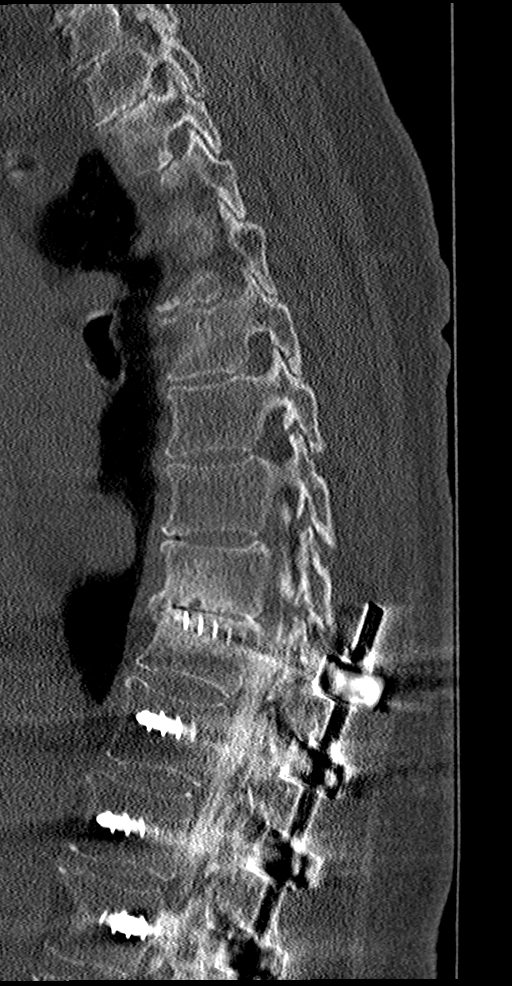
[im 28/55  bone]
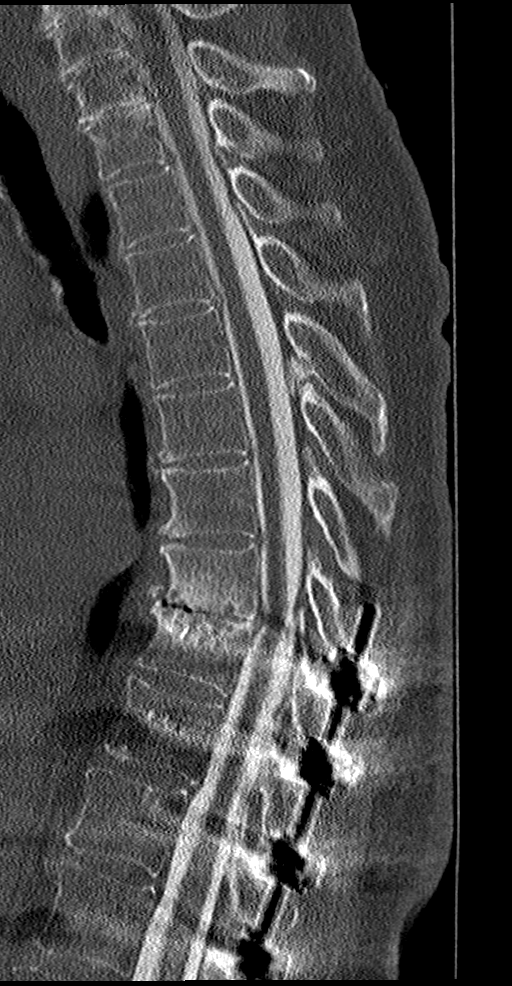
[im 32/55  bone]
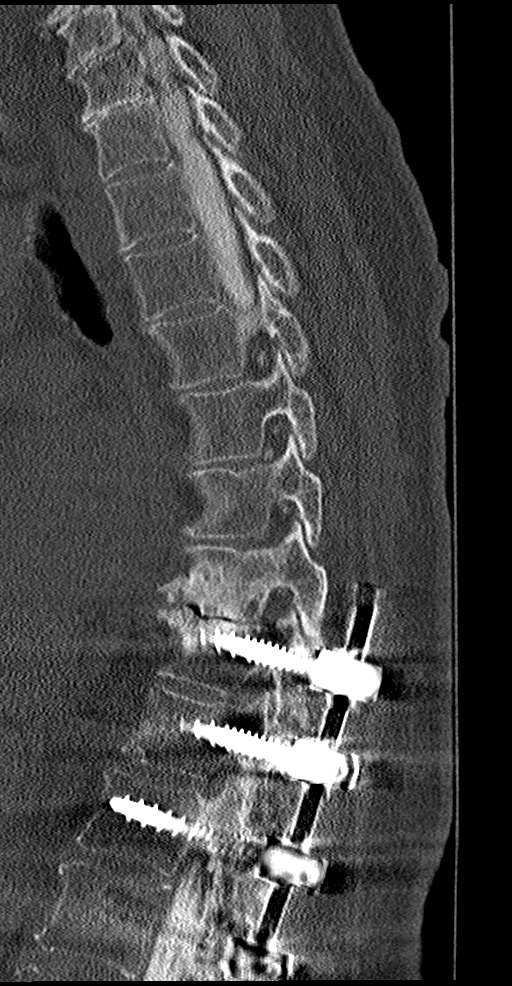
[im 37/55  bone]
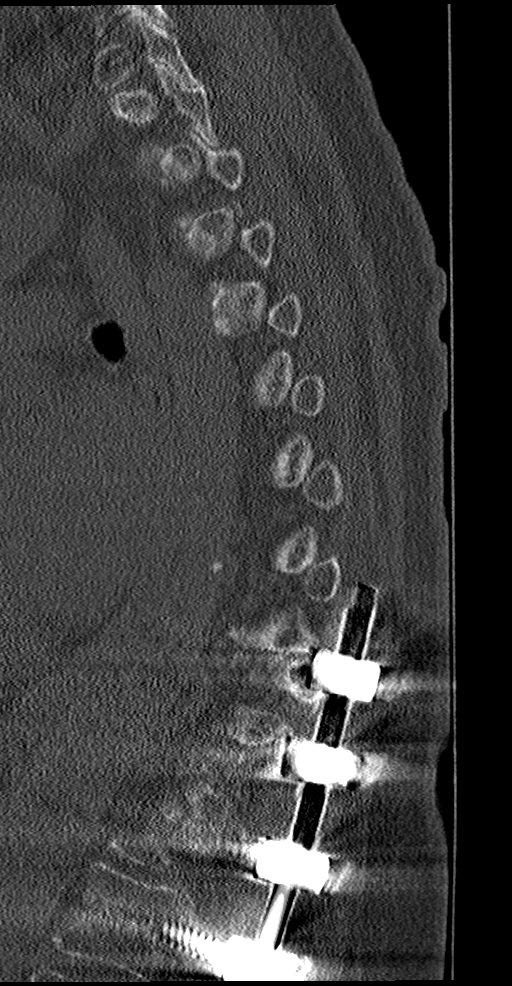

[Series 7: coronal bone · coronal · 0.21mm/px · 3 of 67 slices shown]
[im 14/67  bone]
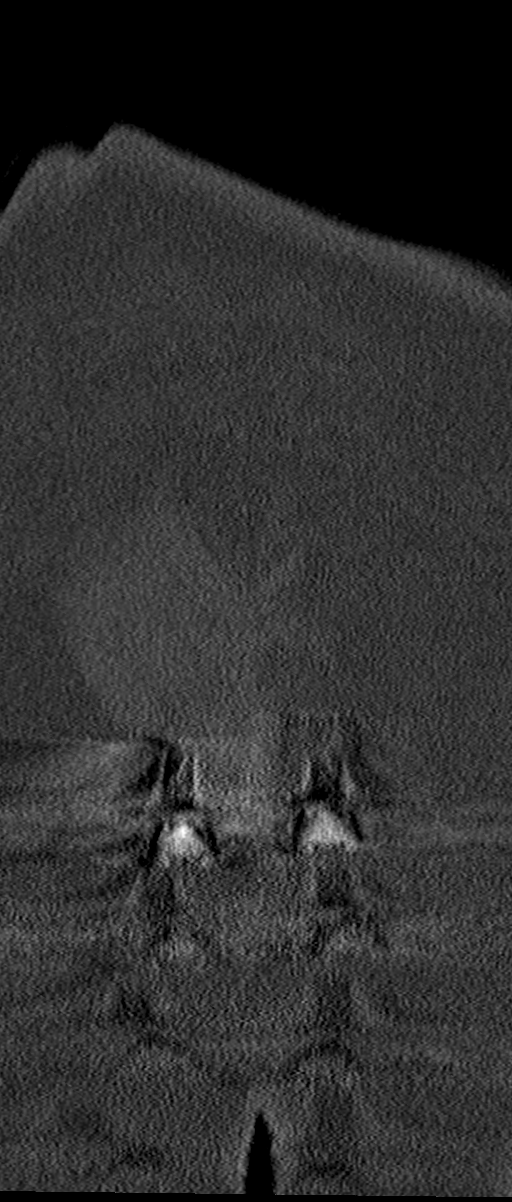
[im 27/67  bone]
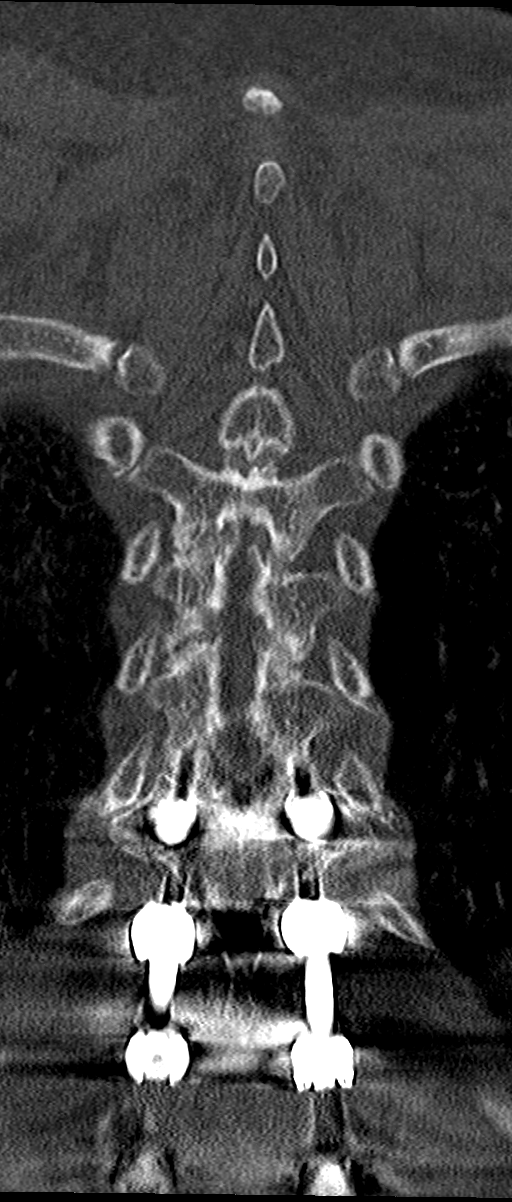
[im 40/67  bone]
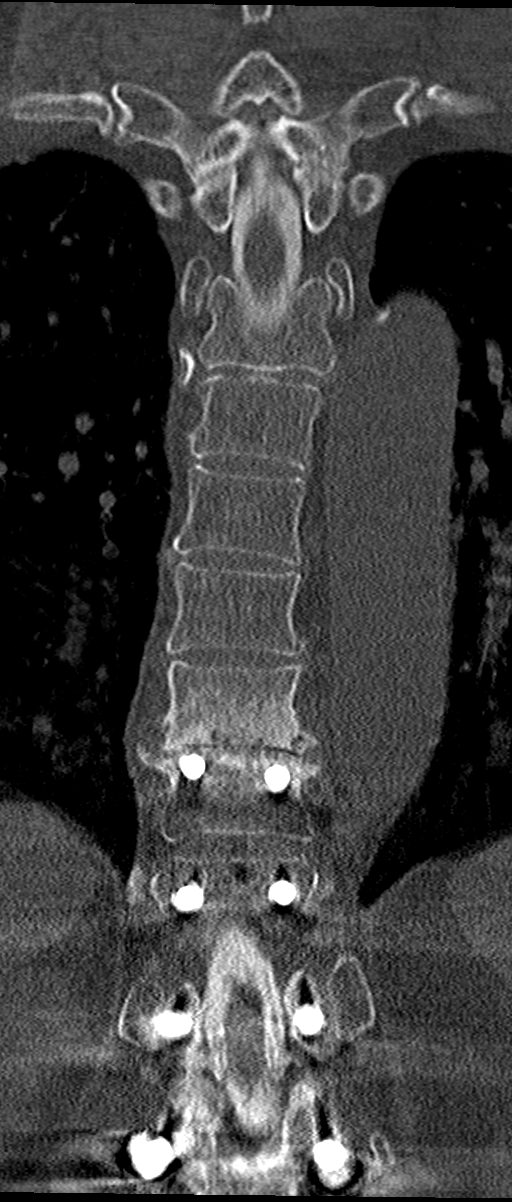

[9 of 33 positions shown; findings below may reference images not displayed]

FINDINGS: Segmentation: There are 11 rib-bearing thoracic segments and 5
non-rib-bearing lumbar segments assigned L1-L5 as before.

Alignment: There is a mild thoracic dextroscoliosis apex T8, and a
mild compensatory lumbar levoscoliosis apex L2. There is a mild
focal kyphosis at T8-9.

Vertebrae: No fracture, suggestion of discitis, or focal bone
lesion. Posterior spinal fixation hardware with bilateral pedicle
screws T9-S1, with intact interconnecting hardware. Additional
fixation hardware extends across bilateral sacroiliac joints.
Anterior fixation hardware at L4-5 and L5-S1 with interbody graft at
both levels, solid-appearing fusion. No significant surrounding
lucency.

Conus medullaris: Extends to the L2 level and appears normal.

Paraspinal and other soft tissues: Aortic Atherosclerosis
([BI]-170.0). Linear metallic fragment anterior to the L5 vertebral
body just inferior to the aortic bifurcation, stable since
[DATE] otherwise unremarkable.

Disc levels:

C6-7: Incompletely visualized, moderate narrowing with anterior
endplate spurring.

C7-T1: Moderate narrowing with anterior endplate spurring. No spinal
stenosis.

T1-2: Moderate narrowing of the interspace with small anterior
posterior endplate spurs. No spinal stenosis or cord distortion.

T2-3: Shallow broad posterior disc bulge without stenosis or cord
distortion or contact.

T3-4: Unremarkable

T4-5: Small posterior disc bulge with minimal anterior cord
flattening. No spinal stenosis.

T5-6: Unremarkable

T6-7: Left posterolateral disc protrusion without cord distortion or
spinal stenosis.

T7-8: Mild disc bulge without cord distortion or stenosis.

T8-9: Marked narrowing of the interspace with discogenic sclerosis
in small Schmorl's nodes in the adjacent vertebral bodies. There is
a mild wedge deformity of the T8 vertebral body which had developed
since [DATE], stable since [DATE]. There is a moderate broad
disc protrusion which contacts but does not displace or distort the
thoracic cord.

T9-10: Unremarkable post surgical fixation.

T10-11: Unremarkable post surgical fixation.

T11-T1: Unremarkable, post surgical fixation.

L1-2: Unremarkable, post surgical fixation.

L2-3: Unremarkable, solid-appearing surgical fixation.

L3-4: Solid-appearing posterior fusion

L4-5: Posterior decompression with solid-appearing instrumented
anterior and posterior fusion.

L5-S1: Solid-appearing instrumented anterior posterior spinal
fusion.
IMPRESSION: 1. Endplate spurring and disc bulges C6-T3 without compressive
pathology. Solid-appearing instrumented fusion from T9 to the
pelvis.
2. Small disc bulge T4-5 with minimal anterior cord flattening.
3. Left posterolateral disc protrusion T6-7.
4. Chronic mild T8 compression deformity, with advanced degenerative
disc disease and broad posterior disc protrusion T8-9.
5. Posterior instrumented spinal fusion from T9 to the pelvis,
without apparent complication.

## 2020-01-05 IMAGING — RF DG MYELOGRAM LUMBAR
9 series · 9 of 9 positions shown · non-contrast
Comparison: none

CLINICAL DATA: Back pain

[Series 1: fluoro_myelogram_singleshot_bw · 0.09mm/px · 1 of 1 slices shown (1 of 9)]
[im 1/1]
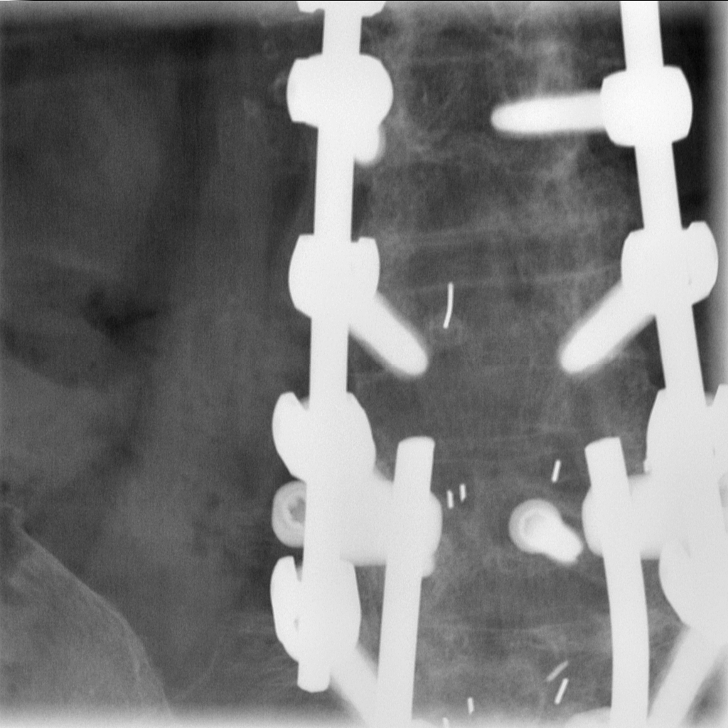

[Series 2: fluoro_myelogram_singleshot_bw · 0.09mm/px · 1 of 1 slices shown (2 of 9)]
[im 1/1]
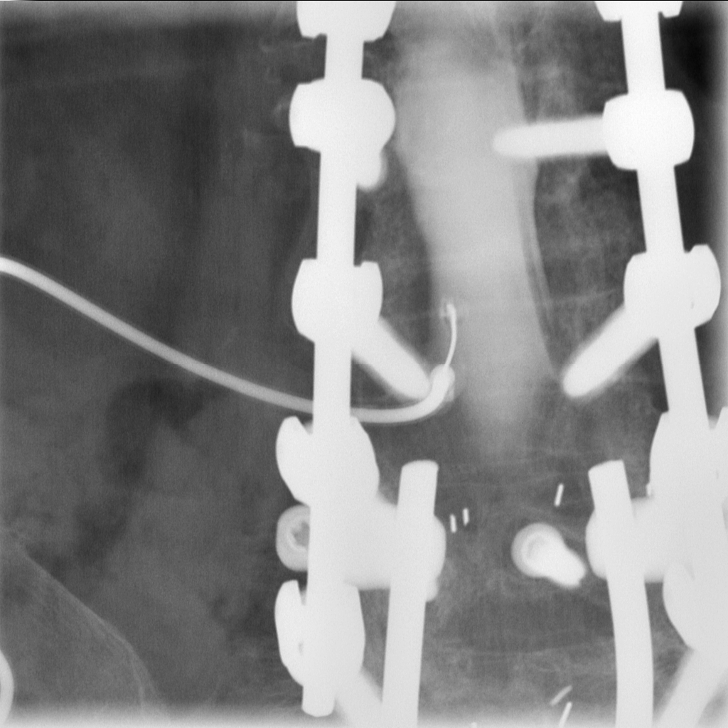

[Series 3: fluoro_myelogram_singleshot_bw · 0.19mm/px · 1 of 1 slices shown (3 of 9)]
[im 1/1]
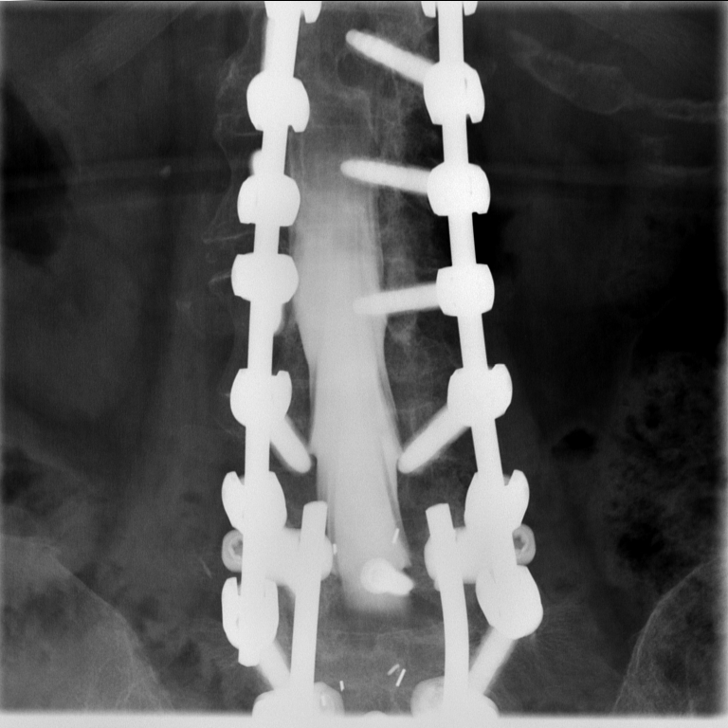

[Series 4: fluoro_myelogram_singleshot_bw · 0.19mm/px · 1 of 1 slices shown (4 of 9)]
[im 1/1]
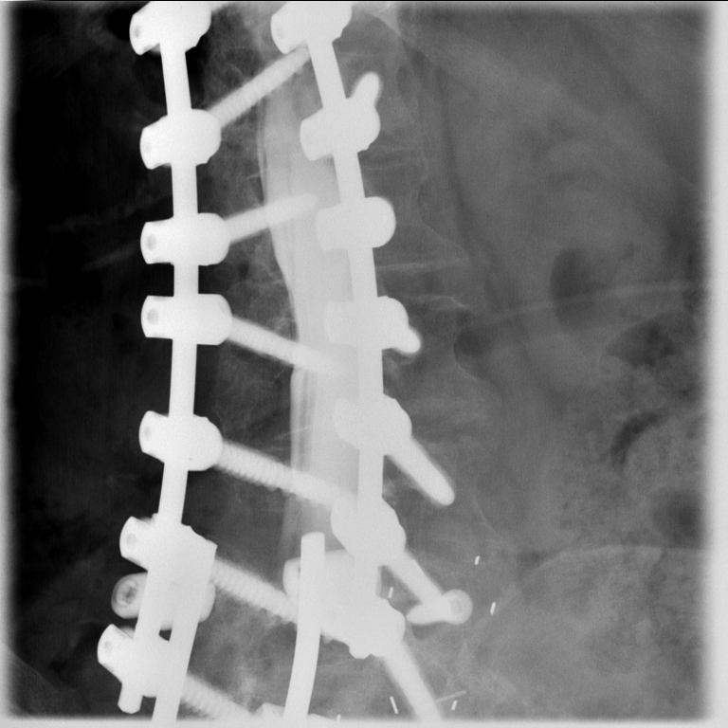

[Series 5: fluoro_myelogram_singleshot_bw · 0.19mm/px · 1 of 1 slices shown (5 of 9)]
[im 1/1]
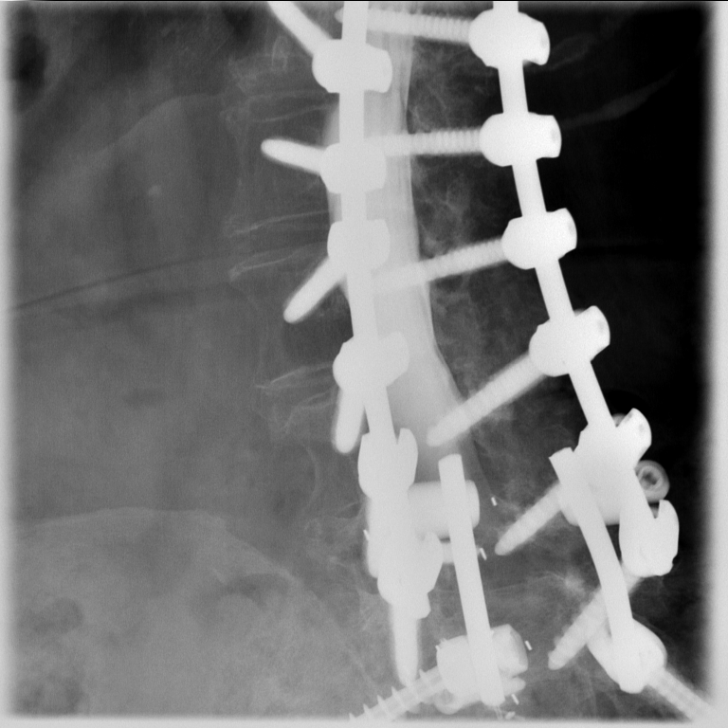

[Series 6: fluoro_myelogram_singleshot_bw · 0.18mm/px · 1 of 1 slices shown (6 of 9)]
[im 1/1]
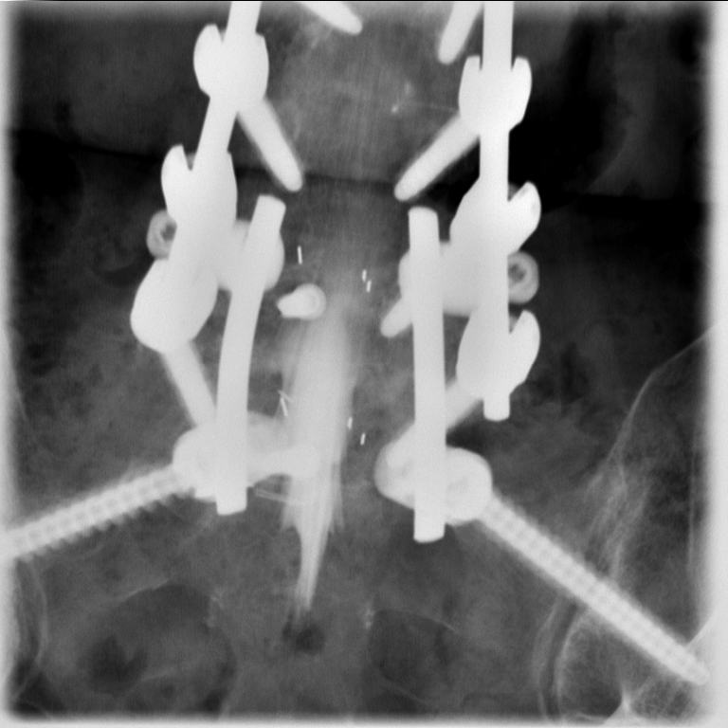

[Series 7: fluoro_myelogram_singleshot_bw · 0.19mm/px · 1 of 1 slices shown (7 of 9)]
[im 1/1]
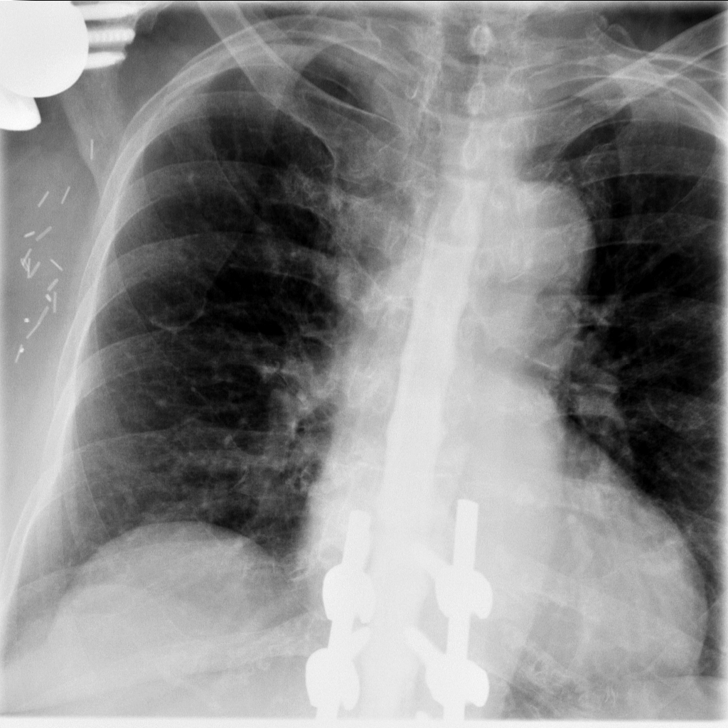

[Series 8: fluoro_myelogram_singleshot_bw · 0.19mm/px · 1 of 1 slices shown (8 of 9)]
[im 1/1]
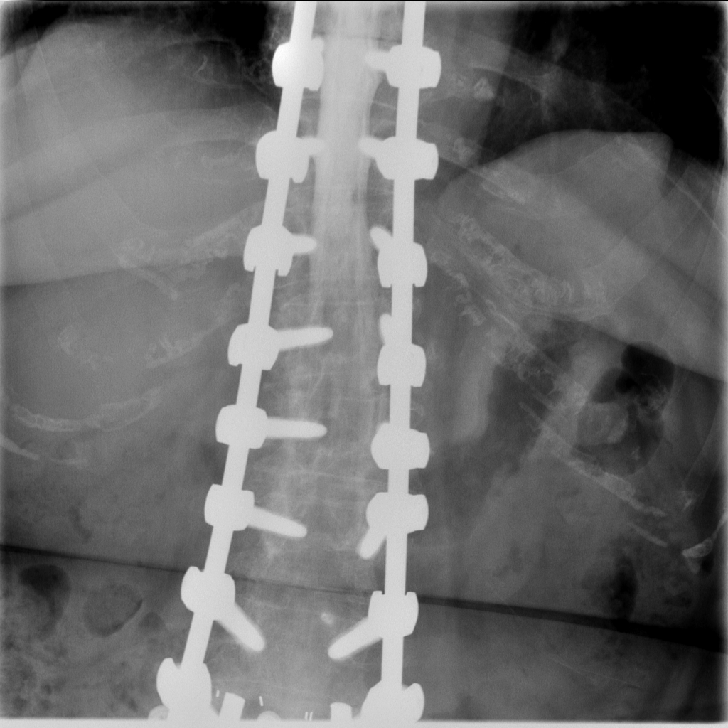

[Series 9: fluoro_myelogram_singleshot_bw · 0.19mm/px · 1 of 1 slices shown (9 of 9)]
[im 1/1]
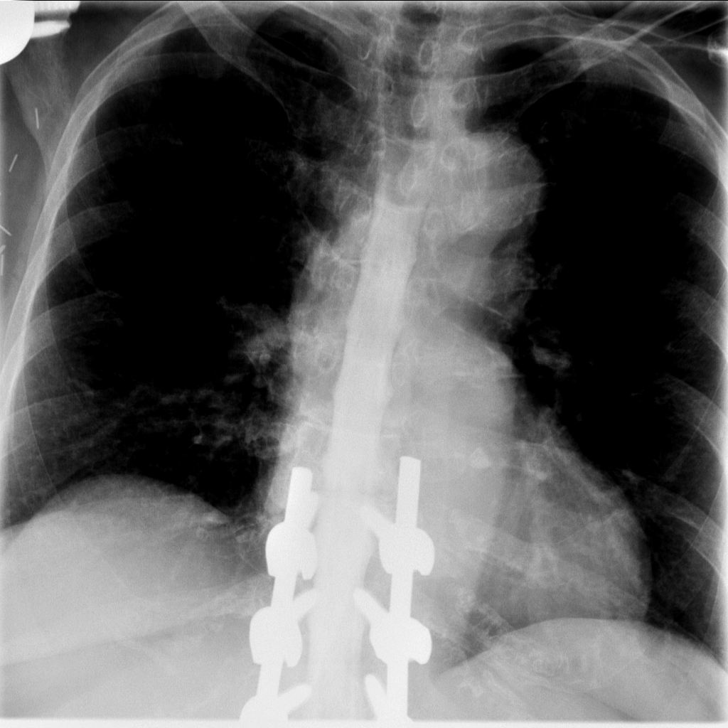

[9 of 9 positions shown; findings below may reference images not displayed]

FLUOROSCOPY TIME:  2 minutes 6 seconds

PROCEDURE:
LUMBAR PUNCTURE FOR THORACIC AND LUMBAR MYELOGRAM

After thorough discussion of risks and benefits of the procedure
including bleeding, infection, injury to nerves, blood vessels,
adjacent structures as well as headache and CSF leak, written and
oral informed consent was obtained. Consent was obtained by Dr. CEESAY
CEESAY.

Patient was positioned prone on the fluoroscopy table. Local
anesthesia was provided with 1% lidocaine without epinephrine after
prepped and draped in the usual sterile fashion. Puncture was
performed at L4 using a 3 1/2 inch 22-gauge spinal needle via
paramedian approach. Using a single pass through the dura, the
needle was placed within the thecal sac, with return of clear CSF.
13 mL of Omnipaque 300 was injected into the thecal sac, with normal
opacification of the nerve roots and cauda equina consistent with
free flow within the subarachnoid space. The patient was then moved
to the trendelenburg position and contrast flowed into the Thoracic
spine region.

I personally performed the lumbar puncture and administered the
intrathecal contrast. I also personally supervised acquisition of
the myelogram images.
FINDINGS: Free flow contrast material is noted into the lumbar thecal sac. No
significant spinal stenosis is noted.

Contrast material flowed freely into the thoracic thecal sac. No
central canal stenosis is noted.

Orthopedic hardware is noted extending from T10-S2. No hardware
failure is noted.
IMPRESSION: No evidence of significant central canal stenosis. Nerve root
impingement will be better assessed on upcoming CT.

## 2020-01-05 IMAGING — CT CT L SPINE W/ CM
3 series · 9 of 33 positions shown, 11 images · non-contrast
Comparison: [DATE] and previous

CLINICAL DATA: Back pain, scoliosis, postop. History of breast
carcinoma.

EXAM:
CT MYELOGRAPHY THORACIC SPINE
CT MYELOGRAPHY LUMBAR SPINE
TECHNIQUE: CT imaging of the lumbar and thoracic spine was performed after
intrathecal contrast administration by radiologist Dr. ABDULGHAFOR.
Multiplanar CT image reconstructions were also generated.

[Series 5: l spine soft (person_name) · axial · 0.28mm/px · z∈[-368,-368]mm · 1 of 124 slices shown, 2 images]
[im 67/124  soft-tissue]
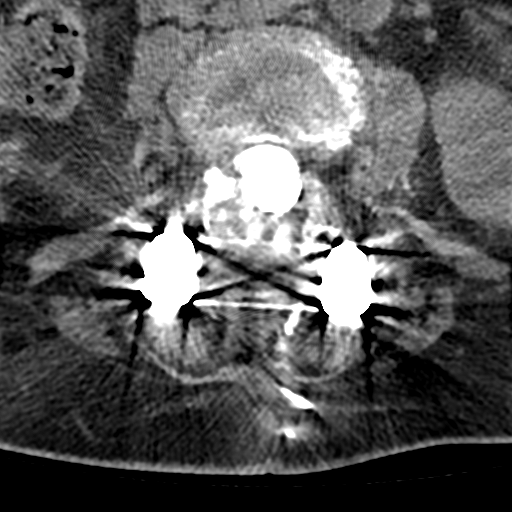
[im 67/124  bone]
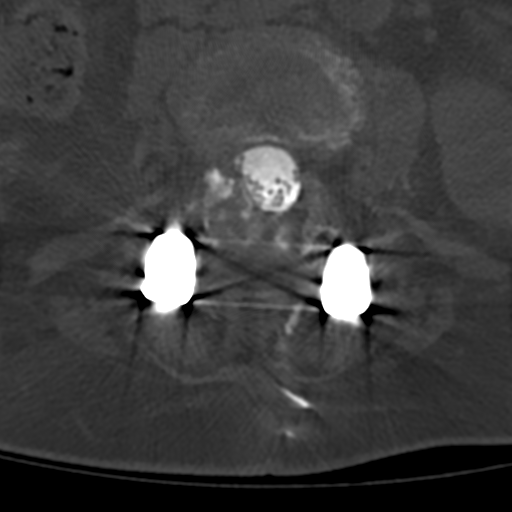

[Series 8: sagittal bone · sagittal · 0.29mm/px · 5 of 74 slices shown, 6 images]
[im 25/74  bone]
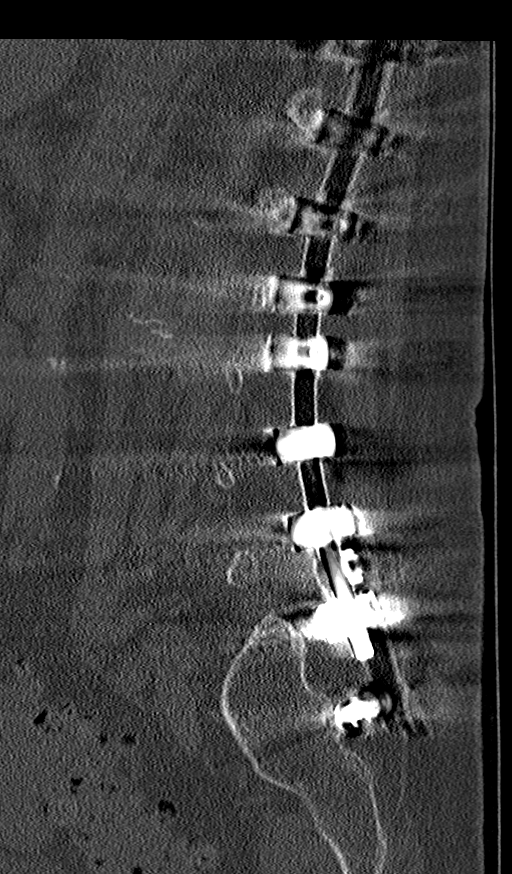
[im 31/74  bone]
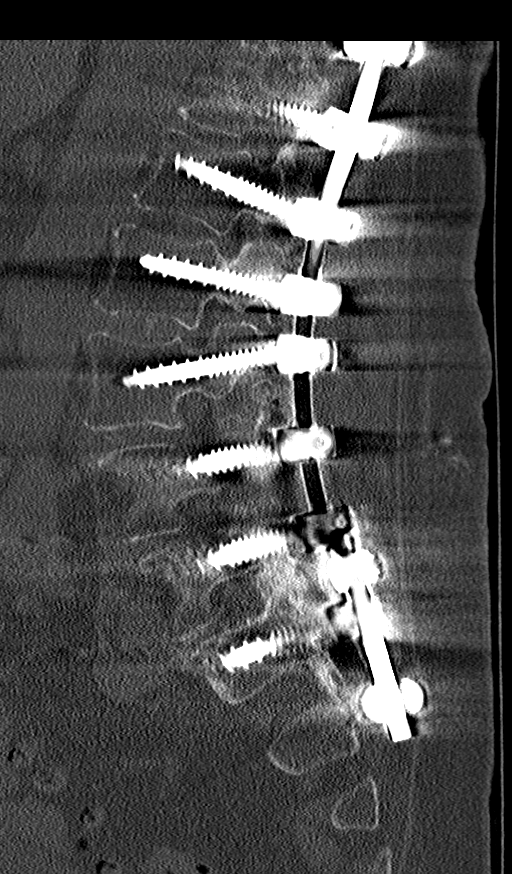
[im 37/74  soft-tissue]
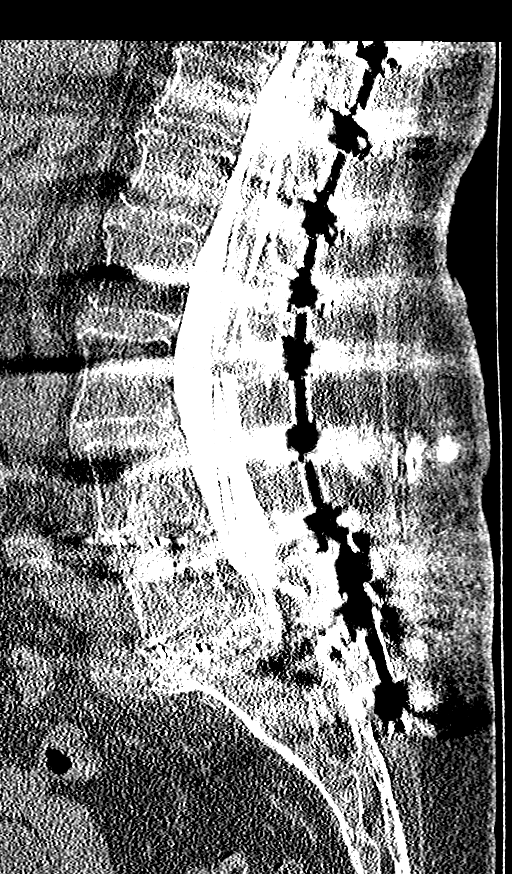
[im 37/74  bone]
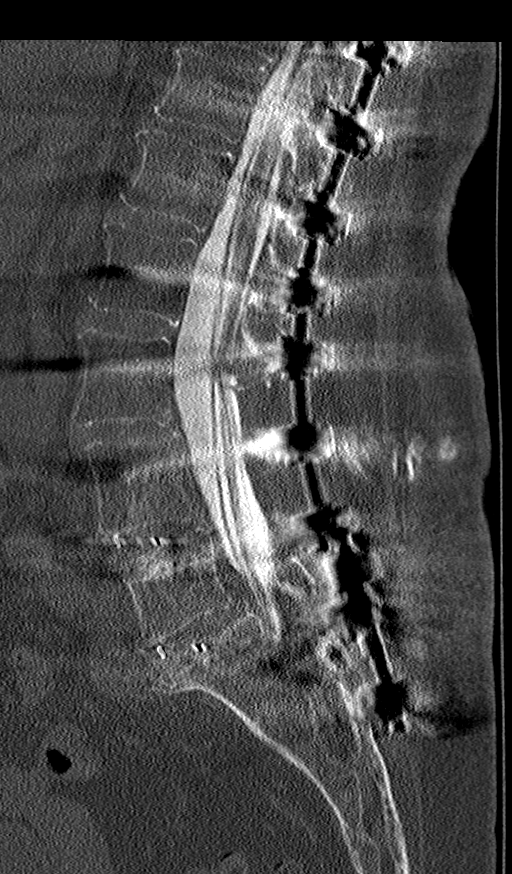
[im 43/74  bone]
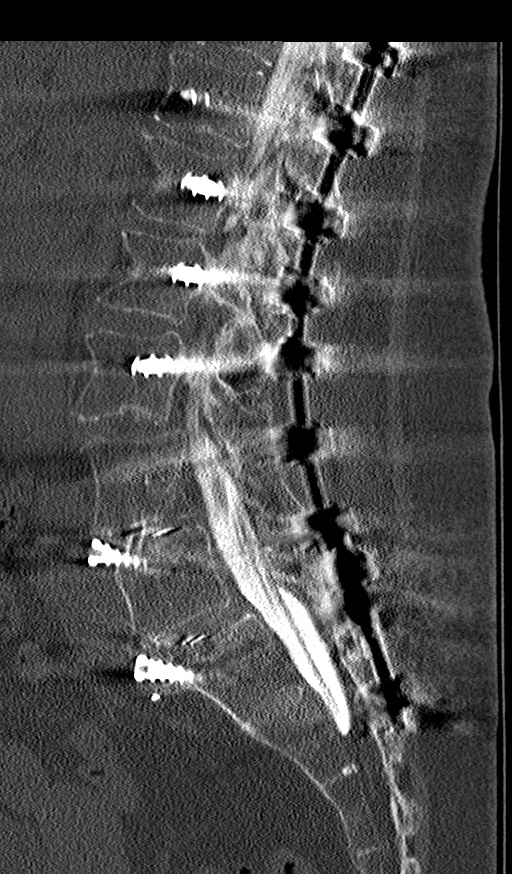
[im 49/74  bone]
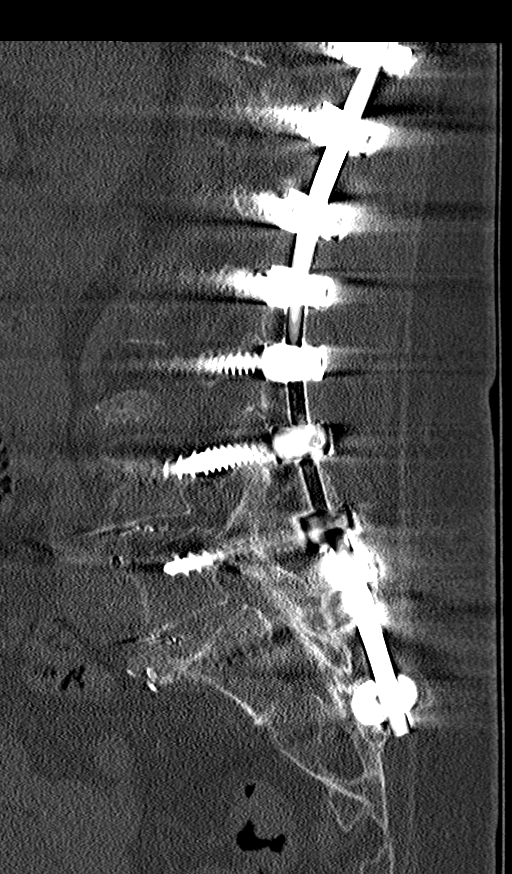

[Series 9: coronal bone · coronal · 0.29mm/px · 3 of 76 slices shown]
[im 16/76  bone]
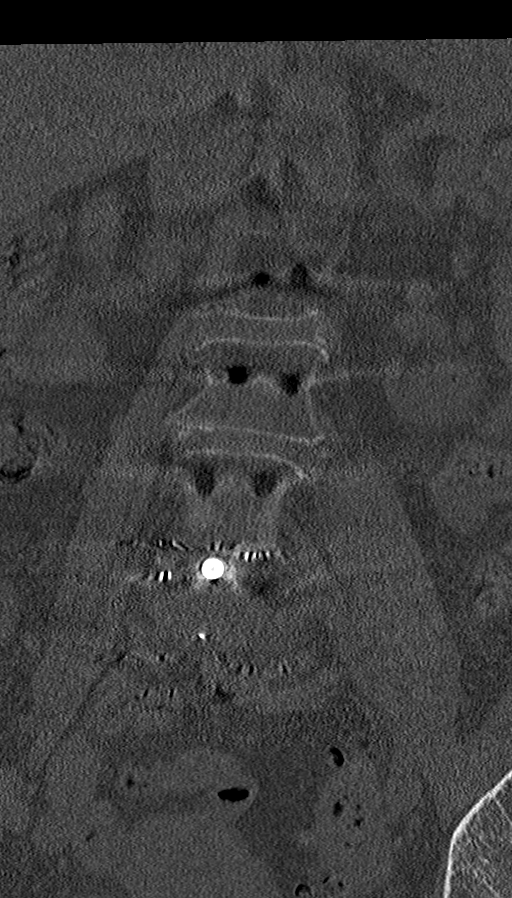
[im 31/76  bone]
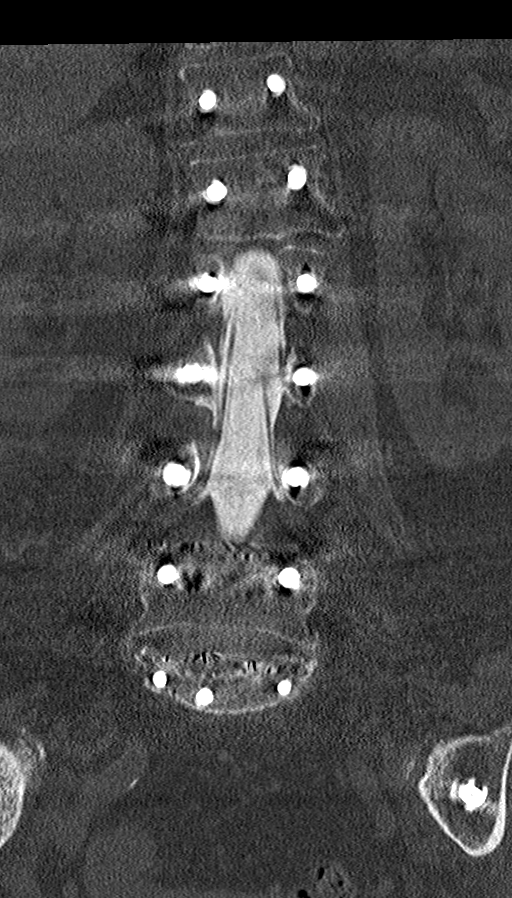
[im 46/76  bone]
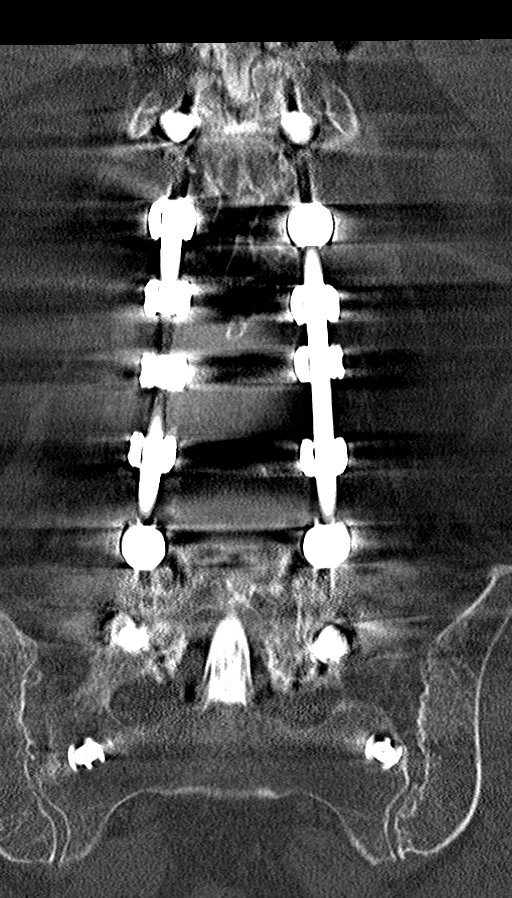

[9 of 33 positions shown; findings below may reference images not displayed]

FINDINGS: Segmentation: There are 11 rib-bearing thoracic segments and 5
non-rib-bearing lumbar segments assigned L1-L5 as before.

Alignment: There is a mild thoracic dextroscoliosis apex T8, and a
mild compensatory lumbar levoscoliosis apex L2. There is a mild
focal kyphosis at T8-9.

Vertebrae: No fracture, suggestion of discitis, or focal bone
lesion. Posterior spinal fixation hardware with bilateral pedicle
screws T9-S1, with intact interconnecting hardware. Additional
fixation hardware extends across bilateral sacroiliac joints.
Anterior fixation hardware at L4-5 and L5-S1 with interbody graft at
both levels, solid-appearing fusion. No significant surrounding
lucency.

Conus medullaris: Extends to the L2 level and appears normal.

Paraspinal and other soft tissues: Aortic Atherosclerosis
([BI]-170.0). Linear metallic fragment anterior to the L5 vertebral
body just inferior to the aortic bifurcation, stable since
[DATE] otherwise unremarkable.

Disc levels:

C6-7: Incompletely visualized, moderate narrowing with anterior
endplate spurring.

C7-T1: Moderate narrowing with anterior endplate spurring. No spinal
stenosis.

T1-2: Moderate narrowing of the interspace with small anterior
posterior endplate spurs. No spinal stenosis or cord distortion.

T2-3: Shallow broad posterior disc bulge without stenosis or cord
distortion or contact.

T3-4: Unremarkable

T4-5: Small posterior disc bulge with minimal anterior cord
flattening. No spinal stenosis.

T5-6: Unremarkable

T6-7: Left posterolateral disc protrusion without cord distortion or
spinal stenosis.

T7-8: Mild disc bulge without cord distortion or stenosis.

T8-9: Marked narrowing of the interspace with discogenic sclerosis
in small Schmorl's nodes in the adjacent vertebral bodies. There is
a mild wedge deformity of the T8 vertebral body which had developed
since [DATE], stable since [DATE]. There is a moderate broad
disc protrusion which contacts but does not displace or distort the
thoracic cord.

T9-10: Unremarkable post surgical fixation.

T10-11: Unremarkable post surgical fixation.

T11-T1: Unremarkable, post surgical fixation.

L1-2: Unremarkable, post surgical fixation.

L2-3: Unremarkable, solid-appearing surgical fixation.

L3-4: Solid-appearing posterior fusion

L4-5: Posterior decompression with solid-appearing instrumented
anterior and posterior fusion.

L5-S1: Solid-appearing instrumented anterior posterior spinal
fusion.
IMPRESSION: 1. Endplate spurring and disc bulges C6-T3 without compressive
pathology. Solid-appearing instrumented fusion from T9 to the
pelvis.
2. Small disc bulge T4-5 with minimal anterior cord flattening.
3. Left posterolateral disc protrusion T6-7.
4. Chronic mild T8 compression deformity, with advanced degenerative
disc disease and broad posterior disc protrusion T8-9.
5. Posterior instrumented spinal fusion from T9 to the pelvis,
without apparent complication.

## 2020-01-05 MED ORDER — LIDOCAINE HCL (PF) 1 % IJ SOLN
5.0000 mL | Freq: Once | INTRAMUSCULAR | Status: AC
  Administered 2020-01-05: 5 mL
  Filled 2020-01-05: qty 5

## 2020-01-05 MED ORDER — IOHEXOL 300 MG/ML  SOLN
50.0000 mL | Freq: Once | INTRAMUSCULAR | Status: AC | PRN
  Administered 2020-01-05: 20 mL

## 2020-01-05 MED ORDER — SODIUM CHLORIDE (PF) 0.9 % IJ SOLN: 10.0000 mL | INTRAMUSCULAR | Status: DC | PRN

## 2020-01-05 NOTE — Discharge Instructions (Signed)
Myelogram  A myelogram is an imaging test. This test checks for problems in the spinal cord and the places where nerves attach to the spinal cord (nerve roots). A dye (contrast material) is put into your spine before the X-ray. This provides a clearer image for your doctor to see. You may need this test if you have a spinal cord problem that cannot be diagnosed with other imaging tests. You may also have this test to check your spine after surgery. Tell a doctor about:  Any allergies you have, especially to iodine.  All medicines you are taking, including vitamins, herbs, eye drops, creams, and over-the-counter medicines.  Any problems you or family members have had with anesthetic medicines or dye.  Any blood disorders you have.  Any surgeries you have had.  Any medical conditions you have or have had, including asthma.  Whether you are pregnant or may be pregnant. What are the risks? Generally, this is a safe procedure. However, problems may occur, including:  Infection.  Bleeding.  Allergic reaction to medicines or dyes.  Damage to your spinal cord or nerves.  Leaking of spinal fluid. This can cause a headache.  Damage to kidneys.  Seizures. This is rare. What happens before the procedure?  Follow instructions from your doctor about what you cannot eat or drink. You may be asked to drink more fluids.  Ask your doctor about changing or stopping your normal medicines. This is important if you take diabetes medicines or blood thinners.  Plan to have someone take you home from the hospital or clinic.  If you will be going home right after the procedure, plan to have someone with you for 24 hours. What happens during the procedure?  You will lie face down on a table.  Your doctor will find the best injection site on your spine. This is most often in the lower back.  This area will be washed with soap.  You will be given a medicine to numb the area (local  anesthetic).  Your doctor will place a long needle into the space around your spinal cord.  A sample of spinal fluid may be taken. This may be sent to the lab for testing.  The dye will be injected into the space around your spinal cord.  The exam table may be tilted. This helps the dye flow up or down your spine.  The X-ray will take images of your spinal cord.  A bandage (dressing) may be placed over the area where the dye was injected. The procedure may vary among doctors and hospitals. What can I expect after the procedure?  You may be monitored until you leave the hospital or clinic. This includes checking your blood pressure, heart rate, breathing rate, and blood oxygen level.  You may feel sore at the injection site. You may have a mild headache.  You will be told to lie flat with your head raised (elevated). This lowers the risk of a headache.  It is up to you to get the results of your procedure. Ask your doctor, or the department that is doing the procedure, when your results will be ready. Follow these instructions at home:   Rest as told by your doctor. Lie flat with your head slightly elevated.  Do not bend, lift, or do hard work for 24-48 hours, or as told by your doctor.  Take over-the-counter and prescription medicines only as told by your doctor.  Take care of your bandage as told by your   doctor.  Drink enough fluid to keep your pee (urine) pale yellow.  Bathe or shower as told by your doctor. Contact a doctor if:  You have a fever.  You have a headache that lasts longer than 24 hours.  You feel sick to your stomach (nauseous).  You vomit.  Your neck is stiff.  Your legs feel numb.  You cannot pee.  You cannot poop (no bowel movement).  You have a rash.  You are itchy or sneezing. Get help right away if:  You have new symptoms or your symptoms get worse.  You have a seizure.  You have trouble breathing. Summary  A myelogram is an  imaging test that checks for problems in the spinal cord and the places where nerves attach to the spinal cord (nerve roots).  Before the procedure, follow instructions from your doctor. You will be told what not to eat or drink, or what medicines to change or stop.  After the procedure, you will be told to lie flat with your head raised (elevated). This will lower your risk of a headache.  Do not bend, lift, or do any hard work for 24-48 hours, or as told by your doctor.  Contact a doctor if you have a stiff neck or numb legs. Get help right away if your symptoms get worse, or you have a seizure or trouble breathing. This information is not intended to replace advice given to you by your health care provider. Make sure you discuss any questions you have with your health care provider. Document Revised: 01/20/2019 Document Reviewed: 01/21/2019 Elsevier Patient Education  2020 [employer]  

## 2020-01-05 NOTE — Procedures (Signed)
T and L myelogram performed without difficulty following single stick at L4  Complications:  None  Blood Loss: none  See dictation in canopy pacs

## 2020-01-07 ENCOUNTER — Telehealth: Payer: Medicare Other | Admitting: Internal Medicine

## 2020-01-07 DIAGNOSIS — M545 Low back pain: Secondary | ICD-10-CM | POA: Diagnosis not present

## 2020-01-07 DIAGNOSIS — M546 Pain in thoracic spine: Secondary | ICD-10-CM | POA: Diagnosis not present

## 2020-01-13 ENCOUNTER — Telehealth (INDEPENDENT_AMBULATORY_CARE_PROVIDER_SITE_OTHER): Payer: Medicare Other | Admitting: Internal Medicine

## 2020-01-13 ENCOUNTER — Telehealth: Payer: Self-pay

## 2020-01-13 ENCOUNTER — Other Ambulatory Visit: Payer: Self-pay

## 2020-01-13 ENCOUNTER — Encounter: Payer: Self-pay | Admitting: Internal Medicine

## 2020-01-13 VITALS — BP 157/84 | HR 77 | Ht 59.0 in | Wt 126.0 lb

## 2020-01-13 DIAGNOSIS — I10 Essential (primary) hypertension: Secondary | ICD-10-CM

## 2020-01-13 DIAGNOSIS — R0789 Other chest pain: Secondary | ICD-10-CM | POA: Diagnosis not present

## 2020-01-13 DIAGNOSIS — R0600 Dyspnea, unspecified: Secondary | ICD-10-CM | POA: Diagnosis not present

## 2020-01-13 DIAGNOSIS — R0609 Other forms of dyspnea: Secondary | ICD-10-CM

## 2020-01-13 MED ORDER — CARVEDILOL 6.25 MG PO TABS: 6.2500 mg | ORAL_TABLET | Freq: Two times a day (BID) | ORAL | 3 refills | Status: DC

## 2020-01-13 NOTE — Telephone Encounter (Signed)

## 2020-01-13 NOTE — Patient Instructions (Signed)
Medication Instructions:  Your physician has recommended you make the following change in your medication:  1- START Carvedilol 6.25 mg by mouth two times a day.  *If you need a refill on your cardiac medications before your next appointment, please call your pharmacy*  Lab Work: none If you have labs (blood work) drawn today and your tests are completely normal, you will receive your results only by: Marland Kitchen MyChart Message (if you have MyChart) OR . A paper copy in the mail If you have any lab test that is abnormal or we need to change your treatment, we will call you to review the results.  Testing/Procedures: none  Follow-Up: At [facility], you and your health needs are our priority.  As part of our continuing mission to provide you with exceptional heart care, we have created designated Provider Care Teams.  These Care Teams include your primary Cardiologist (physician) and Advanced Practice Providers (APPs -  Physician Assistants and Nurse Practitioners) who all work together to provide you with the care you need, when you need it.  Your next appointment:   4-6 week(s)   Appointment scheduled.   The format for your next appointment:   In Person  Provider:    You may see DR Harrell Gave END or one of the following Advanced Practice Providers on your designated Care Team:    Murray Hodgkins, NP  Christell Faith, PA-C  Marrianne Mood, PA-C

## 2020-01-13 NOTE — Progress Notes (Signed)
Virtual Visit via Video Note   This visit type was conducted due to national recommendations for restrictions regarding the COVID-19 Pandemic (e.g. social distancing) in an effort to limit this patient's exposure and mitigate transmission in our community.  Due to her co-morbid illnesses, this patient is at least at moderate risk for complications without adequate follow up.  This format is felt to be most appropriate for this patient at this time.  All issues noted in this document were discussed and addressed.  A limited physical exam was performed with this format.  Please refer to the patient's chart for her consent to telehealth for [facility].   Date:  01/13/2020   ID:  Maria Spencer, DOB [birth year], MRN [id number]  Patient Location: Home Provider Location: Home  PCP:  McLean-Scocuzza, Nino Glow, MD  Cardiologist:  [name], MD Electrophysiologist:  None   Evaluation Performed:  Follow-Up Visit  Chief Complaint: Shortness of breath and elevated blood pressure  History of Present Illness:    Maria Spencer is a 74 y.o. female with history of hypertension, hyperlipidemia, right breast cancer status post lumpectomy and radiation, hypothyroidism, and esophagitis.  I last saw Maria Spencer in 10/2019 for follow-up of chest pain and palpitations.  Preceding cardiac CTA showed no significant CAD.  She reported continued daily chest heaviness as well as palpitations.  Symptoms improved a little with addition of metoprolol.  We agreed to start famotidine 20 mg BID and increase lisinopril to 40 mg daily, given suboptimal blood pressure control.  Today, Maria Spencer reports that she continues to have exertional dyspnea with modest activity.  Her blood pressure has also been labile with some readings up to 170/110.  She is tolerating lisinopril well but has had issues with other medications including metoprolol and omeprazole, both of which were stopped due to GI intolerance.  She notes that  she has a "sensitive stomach."  Maria Spencer endorses occasional pain in the left breast, though this has improved from prior visits.  She denies edema, palpitations, and lightheadedness.  The patient does not have symptoms concerning for COVID-19 infection (fever, chills, cough, or new shortness of breath).    Past Medical History:  Diagnosis Date  . Arthritis    neck, back, left knee;   . Breast cancer [facility]) 2009   right lumpectomy s/p radiation   . Chicken pox   . Chronic UTI    established with urology   . Diverticular disease    -osis and -itis   . Esophagitis    egd 05/17/14 see report scanned into chart  . Hormone disorder   . Hyperlipidemia   . Hypertension    controlled well with medication;   . Personal history of radiation therapy 2009   F/U right breast cancer  . Thyroid disease    hypothyroidism    Past Surgical History:  Procedure Laterality Date  . BONE GRAFT HIP ILIAC CREST     + cage left hip 10/23/17   . BREAST BIOPSY Left 2009   clip,benign  . BREAST BIOPSY Right 2009   +  . BREAST LUMPECTOMY Right 2009   2009 lumpectomy   . CARDIAC CATHETERIZATION     No stents; [place] doesn't recall facility +61yrs ago  . EYE SURGERY     b/l cataracts sch 10/2019 in [place]  . INSERTION OF MESH N/A 05/29/2018   Procedure: INSERTION OF MESH;  Surgeon: Johnathan Hausen, MD;  Location: [facility] ORS;  Service: General;  Laterality: N/A;  .  SHOULDER SURGERY     x2 surgeries both shoulders, right shoulder replacement last in 2012   . SPINE SURGERY  10/23/2017   spinal 09/2017 h/o scoliosis [facility] L4-S1 OLIF T10 to ililum fusion  . TONSILLECTOMY     age 65 y.o.   . TOTAL SHOULDER REPLACEMENT    . VENTRAL HERNIA REPAIR N/A 05/29/2018   Procedure: LAPAROSCOPIC VENTRAL / LATERAL HERNIA REPAIR;  Surgeon: Johnathan Hausen, MD;  Location: [facility] ORS;  Service: General;  Laterality: N/A;     Current Meds  Medication Sig  . atorvastatin (LIPITOR) 10 MG tablet Take 1 tablet (10 mg total) by  mouth every other day. Note 10 mg no need to cut in 1/2 as before you have 20 mg taking 1/2 pill =10 mg  . Cholecalciferol (VITAMIN D3 PO) Take 1 capsule by mouth daily.  . fluticasone (FLONASE) 50 MCG/ACT nasal spray Place 2 sprays into both nostrils daily. Max b/l nostrils  . levothyroxine (SYNTHROID) 88 MCG tablet Take 1 tablet (88 mcg total) by mouth daily before breakfast. 30 minutes  . lisinopril (ZESTRIL) 40 MG tablet Take 1 tablet (40 mg total) by mouth daily.  . Multiple Vitamin (MULTI-VITAMINS) TABS Take 1 tablet by mouth daily.   . nitroGLYCERIN (NITROSTAT) 0.4 MG SL tablet Place 1 tablet (0.4 mg total) under the tongue every 5 (five) minutes as needed for chest pain.  Marland Kitchen zolpidem (AMBIEN) 5 MG tablet Take 1 tablet (5 mg total) by mouth at bedtime as needed for sleep.     Allergies:   Sulfa antibiotics, Nitrofuran derivatives, Amlodipine besylate, and Nitrofurantoin   Social History   Tobacco Use  . Smoking status: Never Smoker  . Smokeless tobacco: Never Used  Substance Use Topics  . Alcohol use: Not Currently    Alcohol/week: 7.0 standard drinks    Types: 7 Glasses of wine per week  . Drug use: No     Family Hx: The patient's family history includes AAA (abdominal aortic aneurysm) in her father; Arthritis in her father, mother, sister, and sister; Cancer in her brother and father; Coronary artery disease (age of onset: 78) in her mother; Diabetes in her father; Drug abuse in her daughter; Heart disease (age of onset: 87) in her mother; Hyperlipidemia in her mother, sister, and sister; Hypertension in her mother and sister; Mental retardation in her brother. There is no history of Bladder Cancer or Kidney cancer.  ROS:   Please see the history of present illness.   All other systems reviewed and are negative.   Prior CV studies:   The following studies were reviewed today:   Cardiac CTA (10/13/2019): No significant CAD.  CAC score = 0.  Incidental note made of  incompletely imaged subpleural nodule in the right lower lobe.  TTE (10/10/2018): Mild focal basal hypertrophy of the septum.  Normal LV size with LVEF 65%.  Grade 1 diastolic dysfunction. Mild MR and TR.  Mild LAE.  Patient reports abnormal stress test attributed to breast attenuation 10 to 15 years ago in [place], [place].  Labs/Other Tests and Data Reviewed:    EKG:  No ECG reviewed.  Recent Labs: 09/10/2019: ALT 15; TSH 2.63 11/01/2019: BUN 23; Creatinine, Ser 0.51; Potassium 3.7; Sodium 138 01/05/2020: Hemoglobin 13.7; Platelets 211   Recent Lipid Panel Lab Results  Component Value Date/Time   CHOL 241 (H) 09/10/2019 02:56 PM   TRIG 106 09/10/2019 02:56 PM   HDL 82 09/10/2019 02:56 PM   CHOLHDL 2.9 09/10/2019 02:56 PM  [place] 138 (H) 09/10/2019 02:56 PM    Wt Readings from Last 3 Encounters:  01/13/20 126 lb (57.2 kg)  01/05/20 127 lb (57.6 kg)  10/27/19 129 lb 8 oz (58.7 kg)     Objective:    Vital Signs:  BP (!) 157/84   Pulse 77   Ht 4\' 11"  (1.499 m)   Wt 126 lb (57.2 kg)   BMI 25.45 kg/m    VITAL SIGNS:  reviewed GEN:  no acute distress  ASSESSMENT & PLAN:    Dyspnea on exertion: This is likely multifactorial, including components of diastolic dysfunction and uncontrolled hypertension.  Underlying pulmonary etiology cannot be excluded, given the fact that echo and cardiac CTA were largely unrevealing except for grade 1 diastolic dysfunction.  Given her medication intolerances, it may be somewhat challenging to optimize her medical therapy.  However, we have agreed to a trial of carvedilol 6.25 mg twice daily.  We could consider adding a thiazide diuretic, but given her history of severe sulfa allergy, I am somewhat reluctant to do this.  Atypical chest pain: Improved from prior visits.  Cardiac CTA was notable for no coronary artery disease.  No further work-up at this time.  Hypertension: Blood pressure suboptimally controlled.  We will add carvedilol 6.25  mg twice daily.  Maria Spencer should continue on lisinopril 40 mg daily.  I encouraged her to continue with a low-sodium diet.  Time:   Today, I have spent 18 minutes with the patient with telehealth technology discussing the above problems.     Medication Adjustments/Labs and Tests Ordered: Current medicines are reviewed at length with the patient today.  Concerns regarding medicines are outlined above.   Tests Ordered: None.  Medication Changes: Meds ordered this encounter  Medications  . carvedilol (COREG) 6.25 MG tablet    Sig: Take 1 tablet (6.25 mg total) by mouth 2 (two) times daily.    Dispense:  60 tablet    Refill:  3    Follow Up:  In Person in 4-6 week(s)  Signed, [name], MD  01/13/2020 2:49 PM    [place]

## 2020-01-14 ENCOUNTER — Ambulatory Visit (INDEPENDENT_AMBULATORY_CARE_PROVIDER_SITE_OTHER): Payer: Medicare Other | Admitting: Internal Medicine

## 2020-01-14 ENCOUNTER — Other Ambulatory Visit: Payer: Self-pay

## 2020-01-14 ENCOUNTER — Encounter: Payer: Self-pay | Admitting: Internal Medicine

## 2020-01-14 VITALS — BP 157/84 | HR 77 | Ht 59.0 in | Wt 126.0 lb

## 2020-01-14 DIAGNOSIS — M546 Pain in thoracic spine: Secondary | ICD-10-CM | POA: Diagnosis not present

## 2020-01-14 DIAGNOSIS — F4321 Adjustment disorder with depressed mood: Secondary | ICD-10-CM

## 2020-01-14 DIAGNOSIS — N3000 Acute cystitis without hematuria: Secondary | ICD-10-CM

## 2020-01-14 DIAGNOSIS — I10 Essential (primary) hypertension: Secondary | ICD-10-CM | POA: Diagnosis not present

## 2020-01-14 DIAGNOSIS — M810 Age-related osteoporosis without current pathological fracture: Secondary | ICD-10-CM | POA: Diagnosis not present

## 2020-01-14 DIAGNOSIS — M5440 Lumbago with sciatica, unspecified side: Secondary | ICD-10-CM | POA: Diagnosis not present

## 2020-01-14 DIAGNOSIS — G8929 Other chronic pain: Secondary | ICD-10-CM

## 2020-01-14 DIAGNOSIS — E039 Hypothyroidism, unspecified: Secondary | ICD-10-CM | POA: Diagnosis not present

## 2020-01-14 DIAGNOSIS — E785 Hyperlipidemia, unspecified: Secondary | ICD-10-CM | POA: Diagnosis not present

## 2020-01-14 NOTE — Progress Notes (Signed)
Virtual Visit via Video Note  I connected with Maria Spencer   on 01/14/20 at  2:20 PM EST by a video enabled telemedicine application and verified that I am speaking with the correct person using two identifiers.  Location patient: home Location provider:work or home office Persons participating in the virtual visit: patient, provider  I discussed the limitations of evaluation and management by telemedicine and the availability of in person appointments. The patient expressed understanding and agreed to proceed.   HPI: 1. dexa +osteoporosis and endocrine kc appt 02/07/20 she may need back surgery and Dr.Yarbourough wants her on meds prior to surgery   2. Chronic pain pain s/p back surgery 6/10 at times trouble walking and pain down legs  She declines narcotics due to 02/01/2017 daughter died of drug overdose and this is coming up on the anniversary of death tried advil/aleve PT made back pain worse but she is still doing some exercises for her mid and low back at home and using a heat shawl  Abnormal CT myelogram with changes at T8/T9 and will need surgery with Dr. Daun Peacock but see # 1  3. HTN still elevated 157/84, 166/92, 213/84, 173/106 HR 67, 77 today and 86  On lis 40 mg qd but has not picked up coreg 6.25 bid sent yesterday by cards to pharmacy   4. Bereavement since death of husband and family lives in [place] her son may move to [place] in 1 year and she does not have a lot of friends in the area. In grief therapy with hospice individual Q3 weeks and tried a group therapy which is helping declines meds for now  Her sister lives in [place] and dad 39 living in [place]   5. H/o UTI no UTI sx's today or in 1.5 months tries D Mannose with cranberry qd    ROS: See pertinent positives and negatives per HPI.  Past Medical History:  Diagnosis Date  . Arthritis    neck, back, left knee;   . Breast cancer [facility]) 2009   right lumpectomy s/p radiation   . Chicken pox   . Chronic UTI    established with  urology   . Diverticular disease    -osis and -itis   . Esophagitis    egd 05/17/14 see report scanned into chart  . Hormone disorder   . Hyperlipidemia   . Hypertension    controlled well with medication;   . Personal history of radiation therapy 2009   F/U right breast cancer  . Thyroid disease    hypothyroidism     Past Surgical History:  Procedure Laterality Date  . BONE GRAFT HIP ILIAC CREST     + cage left hip 10/23/17   . BREAST BIOPSY Left 2009   clip,benign  . BREAST BIOPSY Right 2009   +  . BREAST LUMPECTOMY Right 2009   2009 lumpectomy   . CARDIAC CATHETERIZATION     No stents; [facility] doesn't recall facility +73yrs ago  . EYE SURGERY     b/l cataracts sch 10/2019 in [place]  . INSERTION OF MESH N/A 05/29/2018   Procedure: INSERTION OF MESH;  Surgeon: Johnathan Hausen, MD;  Location: [facility];  Service: General;  Laterality: N/A;  . SHOULDER SURGERY     x2 surgeries both shoulders, right shoulder replacement last in 2012   . SPINE SURGERY  10/23/2017   spinal 09/2017 h/o scoliosis [facility] L4-S1 OLIF T10 to ililum fusion  . TONSILLECTOMY     age  44 y.o.   Marland Kitchen TOTAL SHOULDER REPLACEMENT    . VENTRAL HERNIA REPAIR N/A 05/29/2018   Procedure: LAPAROSCOPIC VENTRAL / [place];  Surgeon: Johnathan Hausen, MD;  Location: [facility];  Service: General;  Laterality: N/A;    Family History  Problem Relation Age of Onset  . Arthritis Mother   . Heart disease Mother 63  . Hyperlipidemia Mother   . Hypertension Mother   . Coronary artery disease Mother 54  . Arthritis Father   . Diabetes Father   . Cancer Father        colon  . AAA (abdominal aortic aneurysm) Father   . Arthritis Sister   . Hyperlipidemia Sister   . Hypertension Sister   . Cancer Brother        lung, smoker  . Mental retardation Brother   . Drug abuse Daughter        overdose in 2018   . Arthritis Sister   . Hyperlipidemia Sister   . Bladder Cancer Neg Hx   . Kidney cancer Neg Hx      SOCIAL HX:  College grad widowed husband died 2019/08/11  Wears seatbelt and safe in relationship  3 kids  [place]   Current Outpatient Medications:  .  atorvastatin (LIPITOR) 10 MG tablet, Take 1 tablet (10 mg total) by mouth every other day. Note 10 mg no need to cut in 1/2 as before you have 20 mg taking 1/2 pill =10 mg, Disp: 90 tablet, Rfl: 3 .  carvedilol (COREG) 6.25 MG tablet, Take 1 tablet (6.25 mg total) by mouth 2 (two) times daily., Disp: 60 tablet, Rfl: 3 .  Cholecalciferol (VITAMIN D3 PO), Take 1 capsule by mouth daily., Disp: , Rfl:  .  fluticasone (FLONASE) 50 MCG/ACT nasal spray, Place 2 sprays into both nostrils daily. Max b/l nostrils, Disp: 48 g, Rfl: 4 .  levothyroxine (SYNTHROID) 88 MCG tablet, Take 1 tablet (88 mcg total) by mouth daily before breakfast. 30 minutes, Disp: 90 tablet, Rfl: 3 .  lisinopril (ZESTRIL) 40 MG tablet, Take 1 tablet (40 mg total) by mouth daily., Disp: 90 tablet, Rfl: 0 .  Multiple Vitamin (MULTI-VITAMINS) TABS, Take 1 tablet by mouth daily. , Disp: , Rfl:  .  zolpidem (AMBIEN) 5 MG tablet, Take 1 tablet (5 mg total) by mouth at bedtime as needed for sleep., Disp: 90 tablet, Rfl: 1 .  nitroGLYCERIN (NITROSTAT) 0.4 MG SL tablet, Place 1 tablet (0.4 mg total) under the tongue every 5 (five) minutes as needed for chest pain., Disp: 25 tablet, Rfl: 3  EXAM:  VITALS per patient if applicable:  GENERAL: alert, oriented, appears well and in no acute distress  HEENT: atraumatic, conjunttiva clear, no obvious abnormalities on inspection of external nose and ears  NECK: normal movements of the head and neck  LUNGS: on inspection no signs of respiratory distress, breathing rate appears normal, no obvious gross SOB, gasping or wheezing  CV: no obvious cyanosis  MS: moves all visible extremities without noticeable abnormality  PSYCH/NEURO: pleasant and cooperative, no obvious depression or anxiety, speech and thought processing  grossly intact  ASSESSMENT AND PLAN:  Discussed the following assessment and plan:  Uncontrolled hypertension Encouraged pt to pickup coreg 6.25 mg bid sent cards to pharm 01/13/20  Cont lis 40 mg qd  Consider renal US in future if BP cont to elevated  Fasting labs 6 or 05/2020   Hypothyroidism, unspecified type Cont meds  Labs as above  Hyperlipidemia LDL goal <70  Chronic low back pain with sciatica, sciatica laterality unspecified s/p prior back surgery in [facility] Chronic thoracic back pain with abnormal CT myelogram T8/9 abnormalities  -f/u Dr. Daun Peacock will likely need surgery in future but tx osteoporosis 1st at least x 3 months   Osteoporosis, unspecified osteoporosis type, unspecified pathological fracture presence Appt 02/07/20 with [facility] endocrine disc tx could be forteo, reclast, evenity transition to prolia  Calcium and vitamin D3 rec calcium 600 mg 2x per day, vitamin D3 2000 Iu daily   Grief F/u with hospice grief therapy   Acute cystitis without hematuria Urine culture in future and consider urology if has ongoing UTIs   HM Had flu shot9/14/20 prevnar had 12/29/17, pna 23 10/10/18  Tdap had 12/09/11 Declines shingrix, hep B vaccine prev.  zostervax had 2012  covid 19 vx pfizer 2/2 had last 01/07/20 Never smoker   s/p right breast lumpectomy 2009 and radiation only  -mammogram7/6/20 negative Pap out of age window no h/o abnormal pap  DEXA had 04/28/13 h/o osteopenia, +osteoporosis 12/27/19  Colonoscopy copy obtained reviewed 01/10/09 grade 1 IH and diverticulosis -colonoscopy due at f/uwill hold for now with other multiple appts  rec healthy diet and exercise  -we discussed possible serious and likely etiologies, options for evaluation and workup, limitations of telemedicine visit vs in person visit, treatment, treatment risks and precautions. Pt prefers to treat via telemedicine empirically rather then risking or undertaking an in person visit at this  moment. Patient agrees to seek prompt in person care if worsening, new symptoms arise, or if is not improving with treatment.   I discussed the assessment and treatment plan with the patient. The patient was provided an opportunity to ask questions and all were answered. The patient agreed with the plan and demonstrated an understanding of the instructions.   The patient was advised to call back or seek an in-person evaluation if the symptoms worsen or if the condition fails to improve as anticipated.  Time spent 20 minutes  Delorise Jackson, MD

## 2020-01-24 ENCOUNTER — Telehealth: Payer: Self-pay | Admitting: Internal Medicine

## 2020-01-24 ENCOUNTER — Ambulatory Visit (INDEPENDENT_AMBULATORY_CARE_PROVIDER_SITE_OTHER): Payer: Medicare Other | Admitting: Family Medicine

## 2020-01-24 ENCOUNTER — Ambulatory Visit
Admission: RE | Admit: 2020-01-24 | Discharge: 2020-01-24 | Disposition: A | Discharge status: Home / Self Care | Payer: Medicare Other | Source: Ambulatory Visit | Attending: Family Medicine | Admitting: Family Medicine

## 2020-01-24 ENCOUNTER — Other Ambulatory Visit: Payer: Self-pay

## 2020-01-24 ENCOUNTER — Encounter: Payer: Self-pay | Admitting: Family Medicine

## 2020-01-24 VITALS — BP 124/82 | HR 89 | Temp 98.0°F | Wt 131.8 lb

## 2020-01-24 DIAGNOSIS — K5732 Diverticulitis of large intestine without perforation or abscess without bleeding: Secondary | ICD-10-CM | POA: Diagnosis not present

## 2020-01-24 DIAGNOSIS — K429 Umbilical hernia without obstruction or gangrene: Secondary | ICD-10-CM | POA: Diagnosis not present

## 2020-01-24 DIAGNOSIS — R1011 Right upper quadrant pain: Secondary | ICD-10-CM | POA: Diagnosis not present

## 2020-01-24 DIAGNOSIS — N3090 Cystitis, unspecified without hematuria: Secondary | ICD-10-CM

## 2020-01-24 DIAGNOSIS — R109 Unspecified abdominal pain: Secondary | ICD-10-CM | POA: Insufficient documentation

## 2020-01-24 DIAGNOSIS — R3915 Urgency of urination: Secondary | ICD-10-CM | POA: Diagnosis not present

## 2020-01-24 DIAGNOSIS — N200 Calculus of kidney: Secondary | ICD-10-CM | POA: Diagnosis not present

## 2020-01-24 DIAGNOSIS — Z87442 Personal history of urinary calculi: Secondary | ICD-10-CM

## 2020-01-24 DIAGNOSIS — R3129 Other microscopic hematuria: Secondary | ICD-10-CM | POA: Diagnosis not present

## 2020-01-24 LAB — POCT URINALYSIS DIPSTICK
Bilirubin, UA: NEGATIVE
Glucose, UA: NEGATIVE
Ketones, UA: NEGATIVE
Protein, UA: NEGATIVE
Spec Grav, UA: 1.015 (ref 1.010–1.025)
Urobilinogen, UA: 1 E.U./dL
pH, UA: 6 (ref 5.0–8.0)

## 2020-01-24 IMAGING — CT CT RENAL STONE PROTOCOL
2 of 3 series · 12 of 36 positions shown, 15 images · non-contrast
Comparison: [DATE] from [HOSPITAL]

CLINICAL DATA: Severe right-sided flank and abdominal pain for 3
days. Microscopic hematuria. Nausea.

EXAM:
CT ABDOMEN AND PELVIS WITHOUT CONTRAST
TECHNIQUE: Multidetector CT imaging of the abdomen and pelvis was performed
following the standard protocol without IV contrast.

[Series 2: axial st · axial · 0.52mm/px · z∈[-886,-531]mm · 11 of 82 slices shown, 13 images]
[im 7/82  soft-tissue]
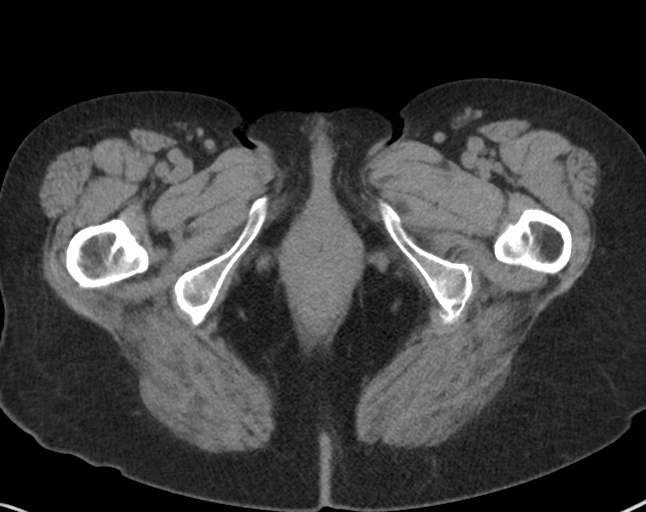
[im 7/82  bone]
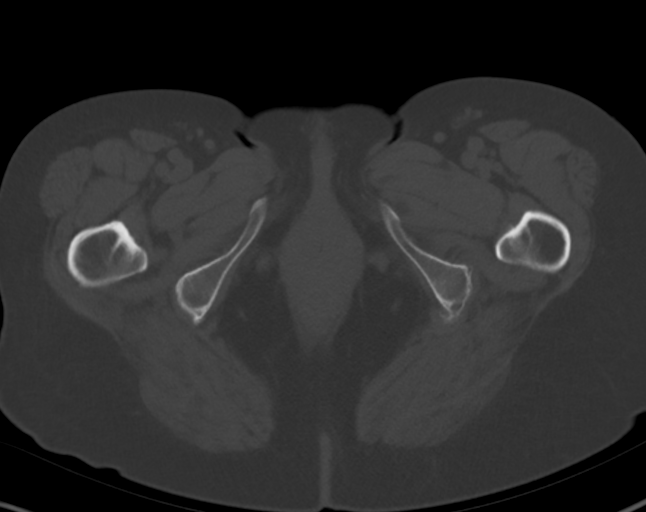
[im 17/82  soft-tissue]
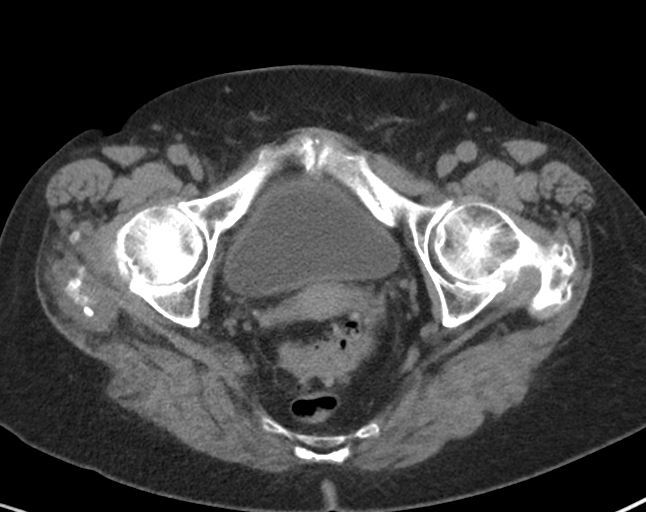
[im 26/82  soft-tissue]
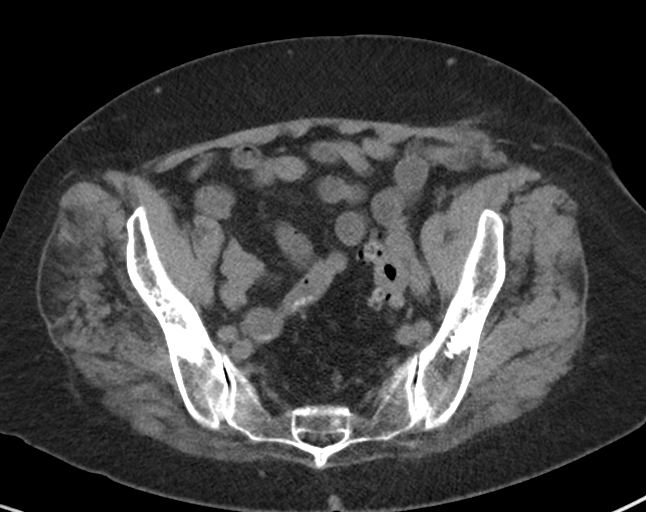
[im 36/82  soft-tissue]
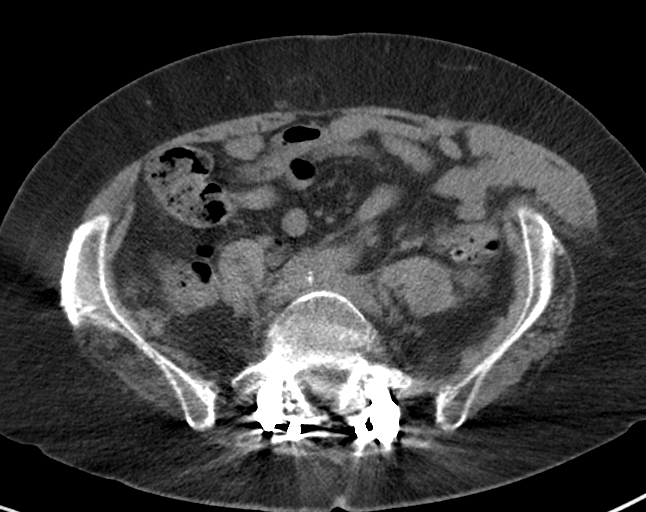
[im 46/82  soft-tissue]
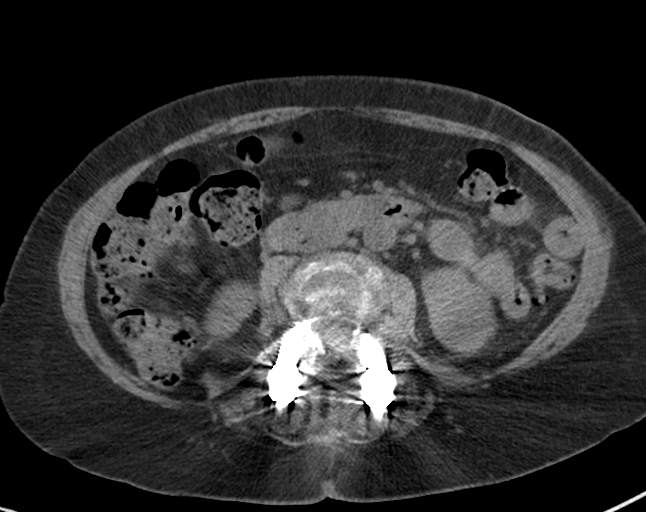
[im 56/82  soft-tissue]
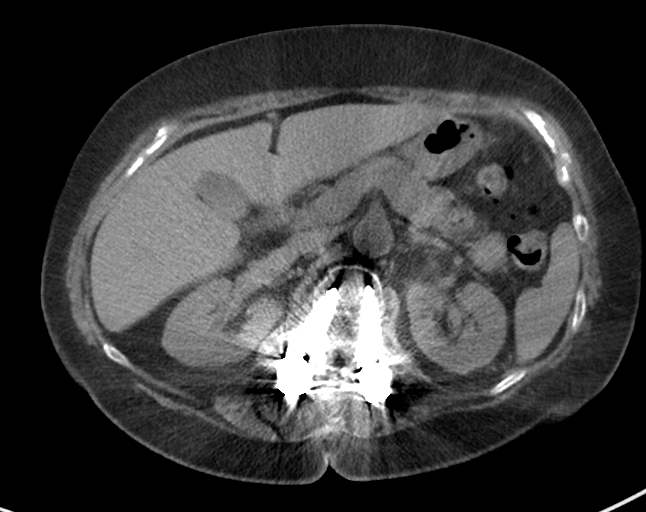
[im 65/82  soft-tissue]
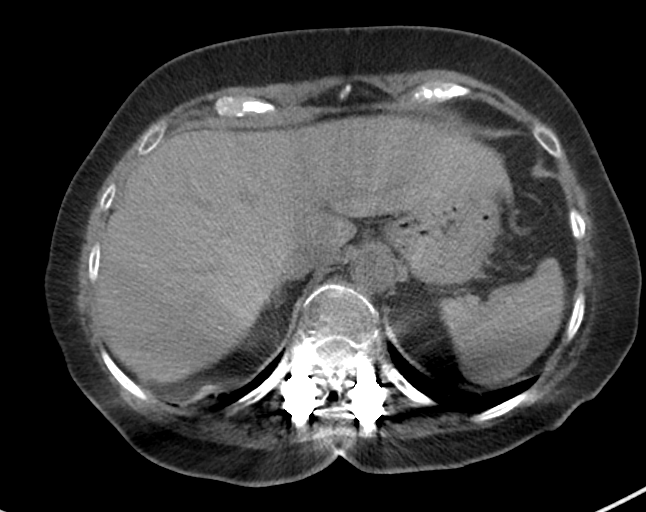
[im 69/82  lung]
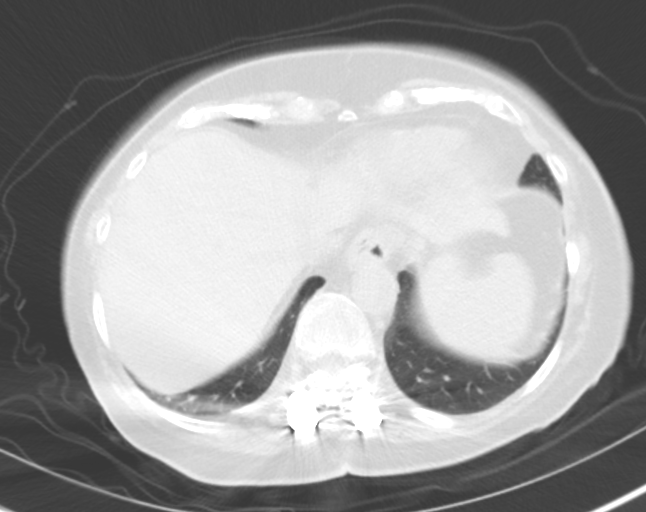
[im 72/82  lung]
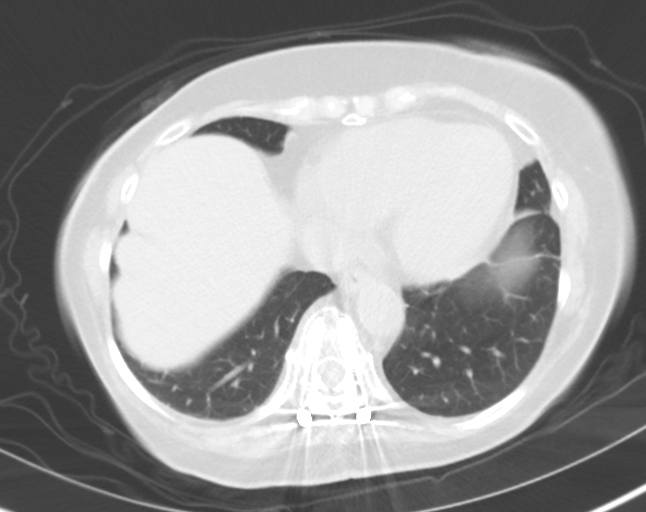
[im 75/82  soft-tissue]
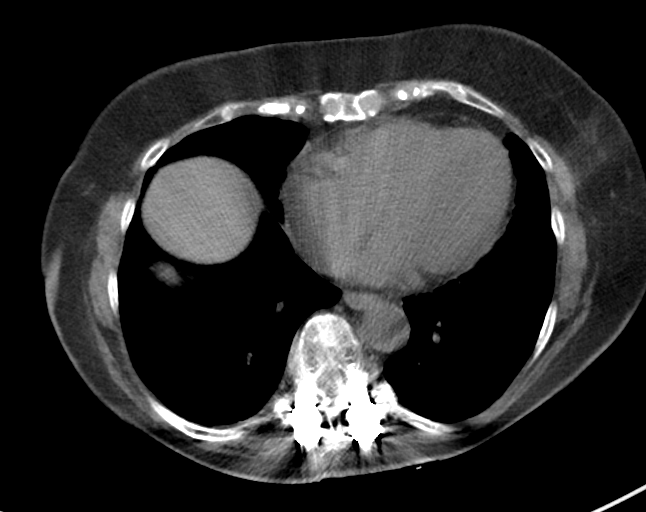
[im 75/82  lung]
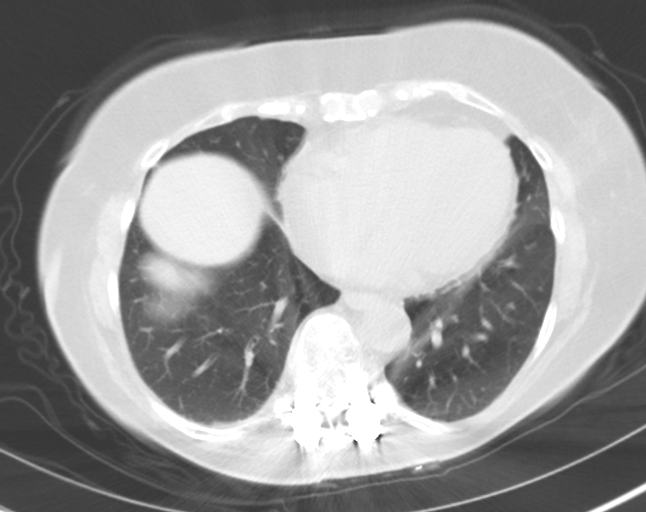
[im 78/82  lung]
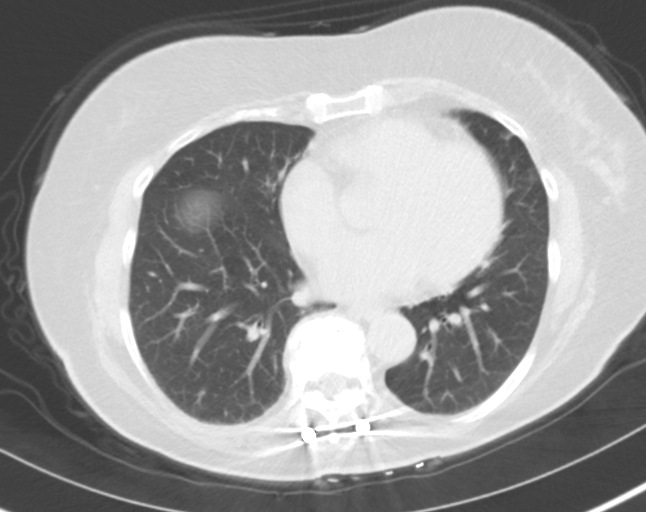

[Series 6: sagittal st · sagittal · 0.61mm/px · 1 of 105 slices shown, 2 images]
[im 35/105  soft-tissue]
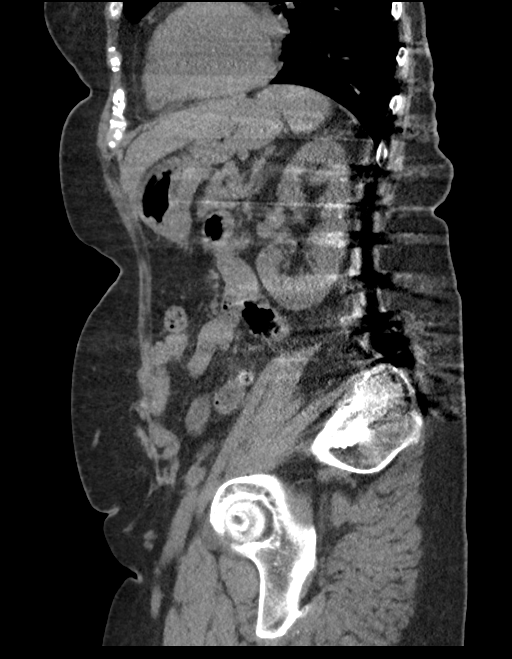
[im 35/105  bone]
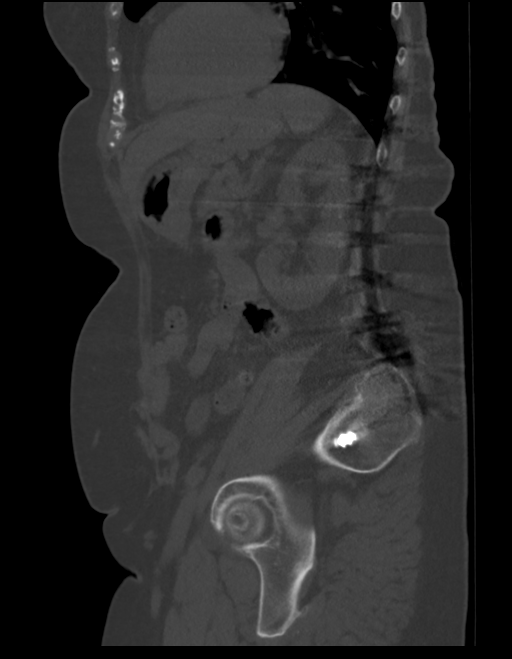

[12 of 36 positions shown; findings below may reference images not displayed]

FINDINGS: Lower chest: No acute findings.

Hepatobiliary: No mass visualized on this unenhanced exam.
Gallbladder is unremarkable. No evidence of biliary ductal
dilatation.

Pancreas: No mass or inflammatory process visualized on this
unenhanced exam.

Spleen:  Within normal limits in size.

Adrenals/Urinary tract: No evidence of urolithiasis or
hydronephrosis.

Stomach/Bowel: Normal appendix visualized. Colonic diverticulosis is
again seen. Mild pericolonic inflammatory changes are seen in the
left lower quadrant near the junction of the descending and sigmoid
colon, consistent with mild diverticulitis. No evidence of abscess
or extraluminal air. No evidence of bowel obstruction.

Vascular/Lymphatic: No pathologically enlarged lymph nodes
identified. No evidence of abdominal aortic aneurysm.

Reproductive:  No mass or other significant abnormality.

Other: A tiny right paraumbilical abdominal wall hernia is again
seen which contains only fat, without significant change.

Musculoskeletal: No suspicious bone lesions identified. Posterior
spinal fixation rods again noted.
IMPRESSION: Mild diverticulitis involving the junction of the descending and
sigmoid colon. No evidence of abscess or other complication.

Stable small right paraumbilical hernia containing only fat.

## 2020-01-24 MED ORDER — CEPHALEXIN 500 MG PO CAPS: 500.0000 mg | ORAL_CAPSULE | Freq: Two times a day (BID) | ORAL | 0 refills | Status: DC

## 2020-01-24 MED ORDER — FLUCONAZOLE 150 MG PO TABS: 150.0000 mg | ORAL_TABLET | Freq: Once | ORAL | 0 refills | Status: AC

## 2020-01-24 NOTE — Progress Notes (Signed)
Subjective:    Patient ID: Maria Spencer, female    DOB: May 16, 1946, 74 y.o.   MRN: [id number]  HPI Chief Complaint  Patient presents with  . Urinary Tract Infection    Urology cannot see patient until 02/07/20. Pt believes that she passed a kidney stone Saturday night. Urinary issues started on Thursday night 2/25 - Pt has been using AZO, drinking Cranberry juice. Pt c/o urgency, frequency, right flank pain and lower abdominal discomfort. Pt cannot take Macrobid (does not work as well as it used to) and last UTI was given Amox. Pt notes having a lot of diarrhea after passing the stone.    This is a 74 yo female who presents today with above cc. Has had urinary frequency, lower abdominal pain x 5 days. Diarrhea x 2 days, resolved now. Saw urology several years ago.  Right sided back pain and right sided cramping. Had episode of severe pain and now with more crampy pain. No unusual stream. Saw black thing in toilet 3 days ago and thinks it was a kidney stone. No fever, some nausea, no vomiting. Tylenol for pain with some improvement.    Review of Systems Per HPI    Objective:   Physical Exam Vitals reviewed.  Constitutional:      General: She is not in acute distress.    Appearance: Normal appearance. She is normal weight. She is not ill-appearing, toxic-appearing or diaphoretic.  HENT:     Head: Normocephalic and atraumatic.  Cardiovascular:     Rate and Rhythm: Normal rate and regular rhythm.     Heart sounds: Normal heart sounds.  Pulmonary:     Effort: Pulmonary effort is normal.     Breath sounds: Normal breath sounds.  Abdominal:     General: Abdomen is flat. Bowel sounds are normal. There is no distension.     Palpations: Abdomen is soft. There is no mass.     Tenderness: There is abdominal tenderness in the suprapubic area. There is no right CVA tenderness, left CVA tenderness, guarding or rebound.     Hernia: No hernia is present.  Musculoskeletal:     Cervical back:  Normal range of motion and neck supple.  Skin:    General: Skin is warm and dry.  Neurological:     Mental Status: She is alert and oriented to person, place, and time.       BP 124/82 (BP Location: Left Arm, Patient Position: Sitting, Cuff Size: Normal)   Pulse 89   Temp 98 F (36.7 C) (Temporal)   Wt 131 lb 12.8 oz (59.8 kg)   SpO2 97%   BMI 26.62 kg/m  Wt Readings from Last 3 Encounters:  01/24/20 131 lb 12.8 oz (59.8 kg)  01/14/20 126 lb (57.2 kg)  01/13/20 126 lb (57.2 kg)       Assessment & Plan:  1. Urgency of urination - POC Urinalysis Dipstick - Urine Culture  2. Flank pain - POC Urinalysis Dipstick - Urine Culture - CT RENAL STONE STUDY; Future  3. Kidney stone - POC Urinalysis Dipstick - Urine Culture  4. Right upper quadrant abdominal pain - CT RENAL STONE STUDY; Future  5. Cystitis - cephALEXin (KEFLEX) 500 MG capsule; Take 1 capsule (500 mg total) by mouth 2 (two) times daily.  Dispense: 14 capsule; Refill: 0 - fluconazole (DIFLUCAN) 150 MG tablet; Take 1 tablet (150 mg total) by mouth once for 1 dose. Repeat if needed  Dispense: 2 tablet; Refill: 0  6.  History of nephrolithiasis - CT RENAL STONE STUDY; Future   Clarene Reamer, FNP-BC  [facility]  01/26/2020 5:56 AM

## 2020-01-24 NOTE — Patient Instructions (Signed)
I have sent in an antibiotic and diflucan to your pharmacy We will notify you of urine culture results  Kidney Stones Kidney stones are rock-like masses that form inside of the kidneys. Kidneys are organs that make pee (urine). A kidney stone may move into other parts of the urinary tract, including:  The tubes that connect the kidneys to the bladder (ureters).  The bladder.  The tube that carries urine out of the body (urethra). Kidney stones can cause very bad pain and can block the flow of pee. The stone usually leaves your body (passes) through your pee. You may need to have a doctor take out the stone. What are the causes? Kidney stones may be caused by:  A condition in which certain glands make too much parathyroid hormone (primary hyperparathyroidism).  A buildup of a type of crystals in the bladder made of a chemical called uric acid. The body makes uric acid when you eat certain foods.  Narrowing (stricture) of one or both of the ureters.  A kidney blockage that you were born with.  Past surgery on the kidney or the ureters, such as gastric bypass surgery. What increases the risk? You are more likely to develop this condition if:  You have had a kidney stone in the past.  You have a family history of kidney stones.  You do not drink enough water.  You eat a diet that is high in protein, salt (sodium), or sugar.  You are overweight or very overweight (obese). What are the signs or symptoms? Symptoms of a kidney stone may include:  Pain in the side of the belly, right below the ribs (flank pain). Pain usually spreads (radiates) to the groin.  Needing to pee often or right away (urgently).  Pain when going pee (urinating).  Blood in your pee (hematuria).  Feeling like you may vomit (nauseous).  Vomiting.  Fever and chills. How is this treated? Treatment depends on the size, location, and makeup of the kidney stones. The stones will often pass out of the body  through peeing. You may need to:  Drink more fluid to help pass the stone. In some cases, you may be given fluids through an IV tube put into one of your veins at the hospital.  Take medicine for pain.  Make changes in your diet to help keep kidney stones from coming back. Sometimes, medical procedures are needed to remove a kidney stone. This may involve:  A procedure to break up kidney stones using a beam of light (laser) or shock waves.  Surgery to remove the kidney stones. Follow these instructions at home: Medicines  Take over-the-counter and prescription medicines only as told by your doctor.  Ask your doctor if the medicine prescribed to you requires you to avoid driving or using heavy machinery. Eating and drinking  Drink enough fluid to keep your pee pale yellow. You may be told to drink at least 8-10 glasses of water each day. This will help you pass the stone.  If told by your doctor, change your diet. This may include: ? Limiting how much salt you eat. ? Eating more fruits and vegetables. ? Limiting how much meat, poultry, fish, and eggs you eat.  Follow instructions from your doctor about eating or drinking restrictions. General instructions  Collect pee samples as told by your doctor. You may need to collect a pee sample: ? 24 hours after a stone comes out. ? 8-12 weeks after a stone comes out, and every  6-12 months after that.  Strain your pee every time you pee (urinate), for as long as told. Use the strainer that your doctor recommends.  Do not throw out the stone. Keep it so that it can be tested by your doctor.  Keep all follow-up visits as told by your doctor. This is important. You may need follow-up tests. How is this prevented? To prevent another kidney stone:  Drink enough fluid to keep your pee pale yellow. This is the best way to prevent kidney stones.  Eat healthy foods.  Avoid certain foods as told by your doctor. You may be told to eat less  protein.  Stay at a healthy weight. Where to find more information  [place] ([facility]): www.kidney.[facility]): www.urologyhealth.org Contact a doctor if:  You have pain that gets worse or does not get better with medicine. Get help right away if:  You have a fever or chills.  You get very bad pain.  You get new pain in your belly (abdomen).  You pass out (faint).  You cannot pee. Summary  Kidney stones are rock-like masses that form inside of the kidneys.  Kidney stones can cause very bad pain and can block the flow of pee.  The stones will often pass out of the body through peeing.  Drink enough fluid to keep your pee pale yellow. This information is not intended to replace advice given to you by your health care provider. Make sure you discuss any questions you have with your health care provider. Document Revised: 03/30/2019 Document Reviewed: 03/30/2019 Elsevier Patient Education  [place].

## 2020-01-24 NOTE — Telephone Encounter (Signed)
Patient thinks she passed a kidney stone over weekend. She tried to get into her urologist, it will be until March 15th until she can get in. She also thinks she has a UTI and would like to do a urine sample. No appointments available in office today.

## 2020-01-24 NOTE — Telephone Encounter (Signed)
Made appointment @ [facility] with Gessner,FNP for today.

## 2020-01-26 LAB — URINE CULTURE
MICRO NUMBER:: 10199564
SPECIMEN QUALITY:: ADEQUATE

## 2020-02-07 ENCOUNTER — Other Ambulatory Visit: Payer: Self-pay

## 2020-02-07 ENCOUNTER — Ambulatory Visit (INDEPENDENT_AMBULATORY_CARE_PROVIDER_SITE_OTHER): Payer: Medicare Other | Admitting: Urology

## 2020-02-07 ENCOUNTER — Encounter: Payer: Self-pay | Admitting: Urology

## 2020-02-07 VITALS — BP 169/87 | HR 92 | Ht 59.0 in | Wt 131.0 lb

## 2020-02-07 DIAGNOSIS — M810 Age-related osteoporosis without current pathological fracture: Secondary | ICD-10-CM | POA: Diagnosis not present

## 2020-02-07 DIAGNOSIS — N3020 Other chronic cystitis without hematuria: Secondary | ICD-10-CM | POA: Diagnosis not present

## 2020-02-07 DIAGNOSIS — N390 Urinary tract infection, site not specified: Secondary | ICD-10-CM

## 2020-02-07 DIAGNOSIS — K219 Gastro-esophageal reflux disease without esophagitis: Secondary | ICD-10-CM | POA: Diagnosis not present

## 2020-02-07 DIAGNOSIS — E039 Hypothyroidism, unspecified: Secondary | ICD-10-CM | POA: Diagnosis not present

## 2020-02-07 DIAGNOSIS — E559 Vitamin D deficiency, unspecified: Secondary | ICD-10-CM | POA: Diagnosis not present

## 2020-02-07 LAB — BLADDER SCAN AMB NON-IMAGING: Scan Result: 45

## 2020-02-07 MED ORDER — CEPHALEXIN 250 MG PO CAPS: 250.0000 mg | ORAL_CAPSULE | Freq: Every day | ORAL | 11 refills | Status: DC

## 2020-02-07 NOTE — Progress Notes (Signed)
02/07/2020 2:16 PM   Maria Spencer 1946-07-18 [id number]  Referring provider: McLean-Scocuzza, Nino Glow, MD [place],  [place] [address]  No chief complaint on file.   HPI: The patient was consulted to [place] for recurrent urinary tract infections.  I went through some of her medical records.  She has had positive cultures resistant to antibiotics.   She had been placed on trimethoprim for 90 days and had a sling 25 years ago.  She had a negative CAT scan in May 2016.  She is noted to have a small cystocele.  The patient describes pressure and bloating and small volume frequency that generally respond to antibiotics.  She can get an infection every several weeks.  She was on Macrodantin daily for 30 days.  10 days after stopping it she once again had another infection  At baseline she voids every 2 hours and is continent.  She gets up 3-4 times a night and denies ankle edema and does not take Lasix.  The pathophysiology of recurrent urinary tract infections in the need for long-term prophylaxis on a daily basis discussed.  I will send her urine for culture today.  In the future I would re-baseline her nighttime frequency and treatment goals.    The patient has little to no symptoms and possibly some mild fullness from her cystocele.  The patient thought that maybe trimethoprim did not work as well but I think she was self treating herself as opposed to taking it prophylactically.  Having said that we gave her 57-month supply of daily Macrodantin with 3 refills and I will reassess her in about 10 weeks after her back surgery.  I forgot to mention estrogen cream but I did mention probiotics  Today Patient describes 6 or 7 bladder infections in the last year.  She will get difficulty voiding and burning and heaviness that respond usually to antibiotics.  She has finished an antibiotic and she is not for certain if it is totally cleared.  She describes breakthrough infections on the  daily Macrodantin so was asked to stop it due to breakthrough resistant organisms  I reviewed medical records and she has had multiple positive cultures.  She had a recent CT scan of the abdomen and normal kidneys  She has a severe reaction to sulfa drug and I think is best not to try trimethoprim which may have worked in the past but she was not necessarily taking it daily.  And now looks like she might be allergic to Macrodantin when I reviewed her allergies.  She understands her relative treatment options and we talked about daily Keflex.  I also mentioned local estrogen cream probiotics and cranberry       PMH: Past Medical History:  Diagnosis Date  . Arthritis    neck, back, left knee;   . Breast cancer [facility]) 2009   right lumpectomy s/p radiation   . Chicken pox   . Chronic UTI    established with urology   . Diverticular disease    -osis and -itis   . Esophagitis    egd 05/17/14 see report scanned into chart  . Hormone disorder   . Hyperlipidemia   . Hypertension    controlled well with medication;   . Personal history of radiation therapy 2009   F/U right breast cancer  . Thyroid disease    hypothyroidism     Surgical History: Past Surgical History:  Procedure Laterality Date  . BONE GRAFT HIP ILIAC CREST     +  cage left hip 10/23/17   . BREAST BIOPSY Left 2009   clip,benign  . BREAST BIOPSY Right 2009   +  . BREAST LUMPECTOMY Right 2009   2009 lumpectomy   . CARDIAC CATHETERIZATION     No stents; [facility] doesn't recall facility +37yrs ago  . EYE SURGERY     b/l cataracts sch 10/2019 in [place]  . INSERTION OF MESH N/A 05/29/2018   Procedure: INSERTION OF MESH;  Surgeon: Johnathan Hausen, MD;  Location: [facility];  Service: General;  Laterality: N/A;  . SHOULDER SURGERY     x2 surgeries both shoulders, right shoulder replacement last in 2012   . SPINE SURGERY  10/23/2017   spinal 09/2017 h/o scoliosis [facility] L4-S1 OLIF T10 to ililum fusion  . TONSILLECTOMY       age 14 y.o.   . TOTAL SHOULDER REPLACEMENT    . VENTRAL HERNIA REPAIR N/A 05/29/2018   Procedure: LAPAROSCOPIC VENTRAL / [place];  Surgeon: Johnathan Hausen, MD;  Location: [facility];  Service: General;  Laterality: N/A;    Home Medications:  Allergies as of 02/07/2020      Reactions   Sulfa Antibiotics Anaphylaxis, Hives, Itching, Shortness Of Breath, Swelling   Lips & throat swelled   Nitrofuran Derivatives Swelling   Lip swelling; "eyes get red and puffy"   Amlodipine Besylate Other (See Comments)   Gas, bloating  Gas, bloating    Nitrofurantoin Hives, Other (See Comments)      Medication List       Accurate as of February 07, 2020  2:16 PM. If you have any questions, ask your nurse or doctor.        atorvastatin 10 MG tablet Commonly known as: LIPITOR Take 1 tablet (10 mg total) by mouth every other day. Note 10 mg no need to cut in 1/2 as before you have 20 mg taking 1/2 pill =10 mg   carvedilol 6.25 MG tablet Commonly known as: COREG Take 1 tablet (6.25 mg total) by mouth 2 (two) times daily.   cephALEXin 500 MG capsule Commonly known as: KEFLEX Take 1 capsule (500 mg total) by mouth 2 (two) times daily.   fluticasone 50 MCG/ACT nasal spray Commonly known as: FLONASE Place 2 sprays into both nostrils daily. Max b/l nostrils   levothyroxine 88 MCG tablet Commonly known as: SYNTHROID Take 1 tablet (88 mcg total) by mouth daily before breakfast. 30 minutes   lisinopril 40 MG tablet Commonly known as: ZESTRIL Take 1 tablet (40 mg total) by mouth daily.   Multi-Vitamins Tabs Take 1 tablet by mouth daily.   nitroGLYCERIN 0.4 MG SL tablet Commonly known as: NITROSTAT Place 1 tablet (0.4 mg total) under the tongue every 5 (five) minutes as needed for chest pain.   VITAMIN D3 PO Take 1 capsule by mouth daily.   zolpidem 5 MG tablet Commonly known as: AMBIEN Take 1 tablet (5 mg total) by mouth at bedtime as needed for sleep.       Allergies:   Allergies  Allergen Reactions  . Sulfa Antibiotics Anaphylaxis, Hives, Itching, Shortness Of Breath and Swelling    Lips & throat swelled  . Nitrofuran Derivatives Swelling    Lip swelling; "eyes get red and puffy"  . Amlodipine Besylate Other (See Comments)    Gas, bloating   Gas, bloating   . Nitrofurantoin Hives and Other (See Comments)    Family History: Family History  Problem Relation Age of Onset  . Arthritis Mother   .  Heart disease Mother 85  . Hyperlipidemia Mother   . Hypertension Mother   . Coronary artery disease Mother 20  . Arthritis Father   . Diabetes Father   . Cancer Father        colon  . AAA (abdominal aortic aneurysm) Father   . Arthritis Sister   . Hyperlipidemia Sister   . Hypertension Sister   . Cancer Brother        lung, smoker  . Mental retardation Brother   . Drug abuse Daughter        overdose in 2018   . Arthritis Sister   . Hyperlipidemia Sister   . Bladder Cancer Neg Hx   . Kidney cancer Neg Hx     Social History:  reports that she has never smoked. She has never used smokeless tobacco. She reports previous alcohol use of about 7.0 standard drinks of alcohol per week. She reports that she does not use drugs.  ROS:                                        Physical Exam: There were no vitals taken for this visit.  Constitutional:  Alert and oriented, No acute distress.   Laboratory Data: Lab Results  Component Value Date   WBC 4.8 01/05/2020   HGB 13.7 01/05/2020   HCT 43.1 01/05/2020   MCV 88.0 01/05/2020   PLT 211 01/05/2020    Lab Results  Component Value Date   CREATININE 0.51 11/01/2019    No results found for: PSA  No results found for: TESTOSTERONE  Lab Results  Component Value Date   HGBA1C 5.6 09/10/2019    Urinalysis    Component Value Date/Time   COLORURINE YELLOW 10/20/2019 1400   APPEARANCEUR CLEAR 10/20/2019 1400   APPEARANCEUR Turbid (A) 01/27/2019 1653   LABSPEC 1.018  10/20/2019 1400   PHURINE 5.5 10/20/2019 1400   GLUCOSEU NEGATIVE 10/20/2019 1400   GLUCOSEU NEGATIVE 09/22/2018 1416   HGBUR TRACE (A) 10/20/2019 1400   BILIRUBINUR neg 01/24/2020 1107   BILIRUBINUR Negative 01/27/2019 1653   KETONESUR TRACE (A) 10/20/2019 1400   PROTEINUR Negative 01/24/2020 1107   PROTEINUR TRACE (A) 10/20/2019 1400   UROBILINOGEN 1.0 01/24/2020 1107   UROBILINOGEN 0.2 09/22/2018 1416   NITRITE + 01/24/2020 1107   NITRITE POSITIVE (A) 10/20/2019 1400   LEUKOCYTESUR Moderate (2+) (A) 01/24/2020 1107   LEUKOCYTESUR 2+ (A) 10/20/2019 1400    Pertinent Imaging:   Assessment & Plan: Reassess in 8 weeks on daily Keflex.  Call if urine culture is positive.  She might be interested in taking estrogen cream but will speak to her breast cancer provider before every doing so  1. Recurrent UTI  - Urinalysis, Complete - BLADDER SCAN AMB NON-IMAGING   No follow-ups on file.  Reece Packer, MD  [facility] [address] [place], [place] [address] [phone]

## 2020-02-08 LAB — URINALYSIS, COMPLETE
Bilirubin, UA: NEGATIVE
Glucose, UA: NEGATIVE
Ketones, UA: NEGATIVE
Nitrite, UA: NEGATIVE
Protein,UA: NEGATIVE
Specific Gravity, UA: 1.025 (ref 1.005–1.030)
Urobilinogen, Ur: 0.2 mg/dL (ref 0.2–1.0)
pH, UA: 5 (ref 5.0–7.5)

## 2020-02-08 LAB — MICROSCOPIC EXAMINATION

## 2020-02-09 LAB — CULTURE, URINE COMPREHENSIVE

## 2020-02-11 IMAGING — CR DG SI JOINTS 3+V
3 series · 3 of 3 positions shown · non-contrast
Comparison: None.

CLINICAL DATA: Right hip pain

EXAM:
BILATERAL SACROILIAC JOINTS - 3+ VIEW

[si joints ap]
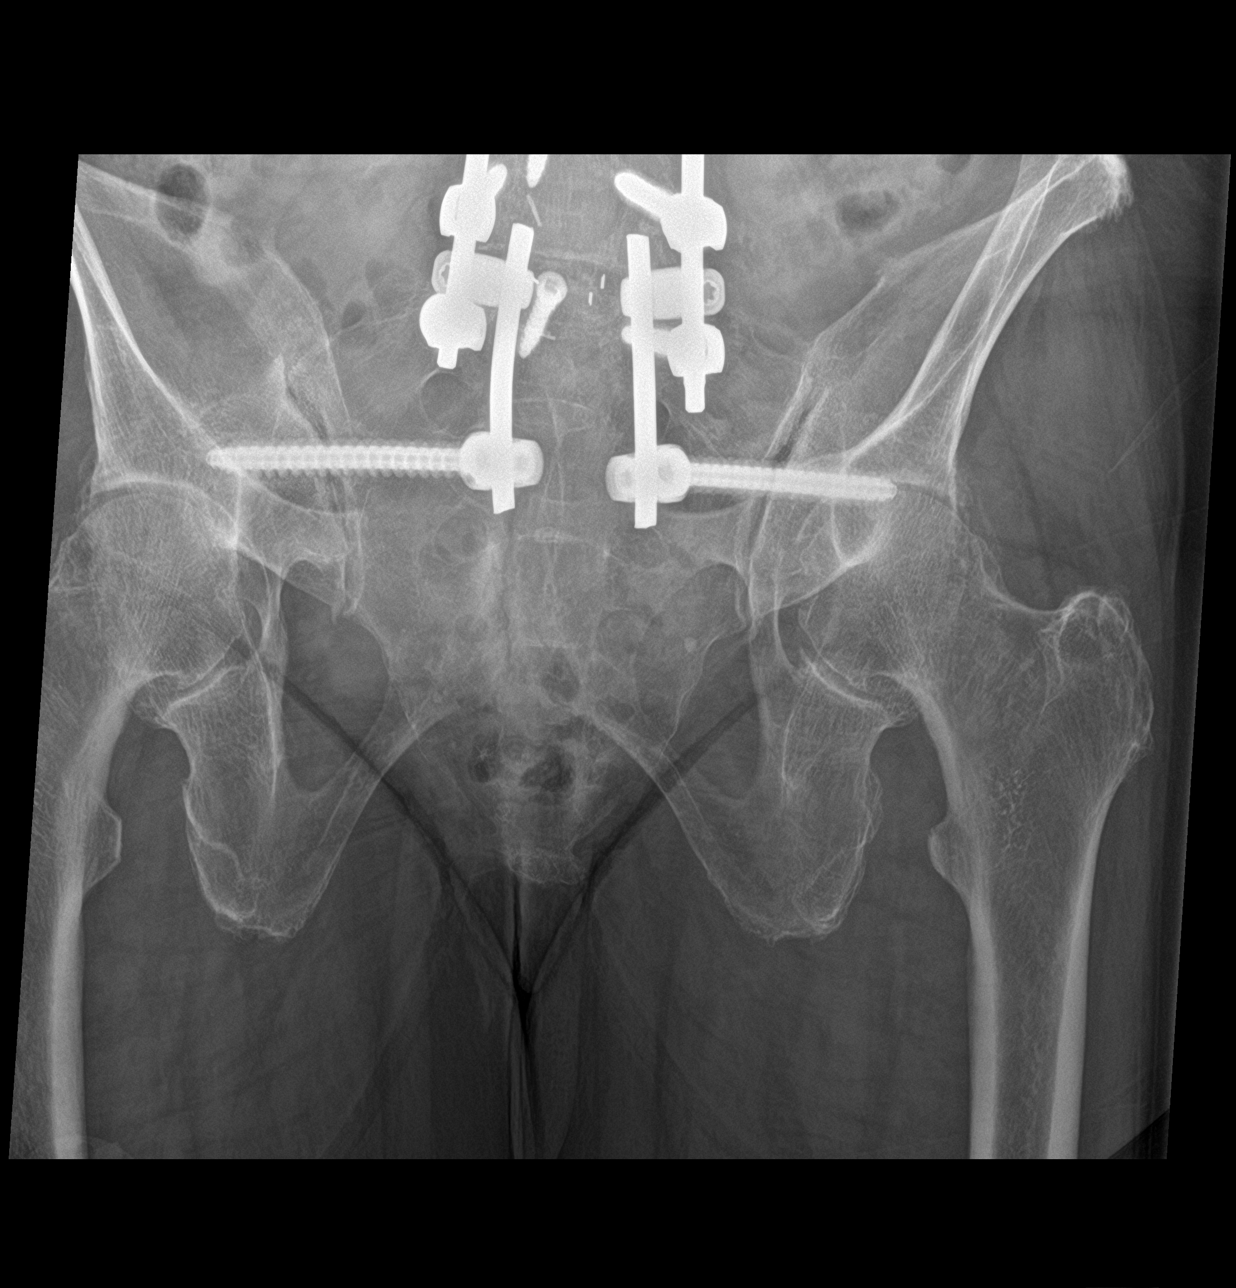

[si joints obl (1 of 2)]
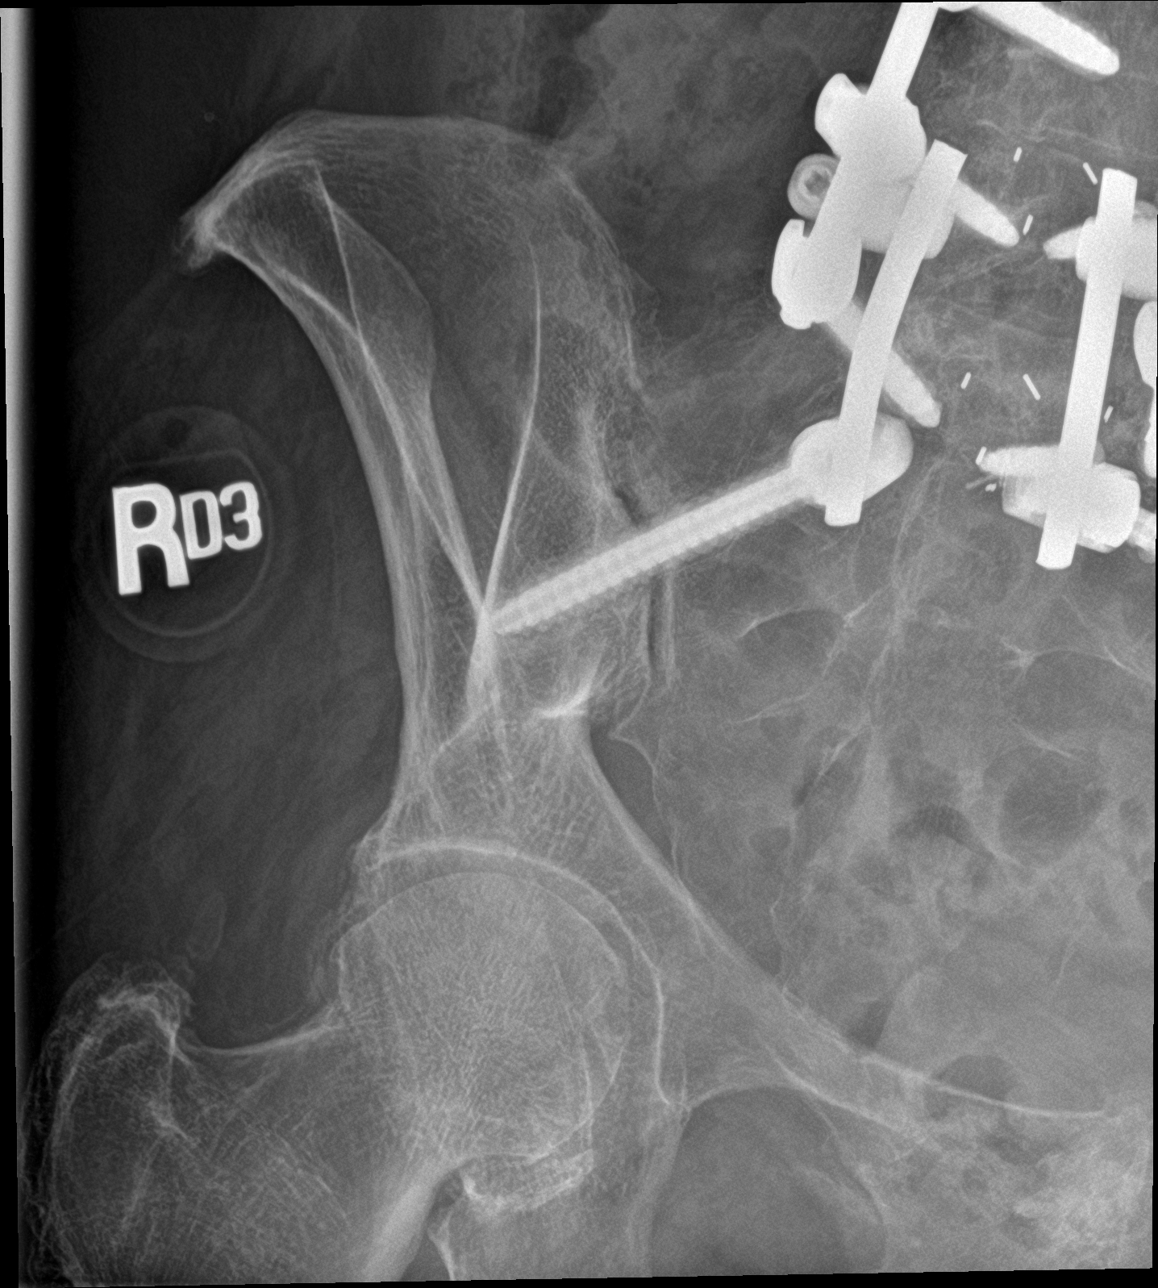

[si joints obl (2 of 2)]
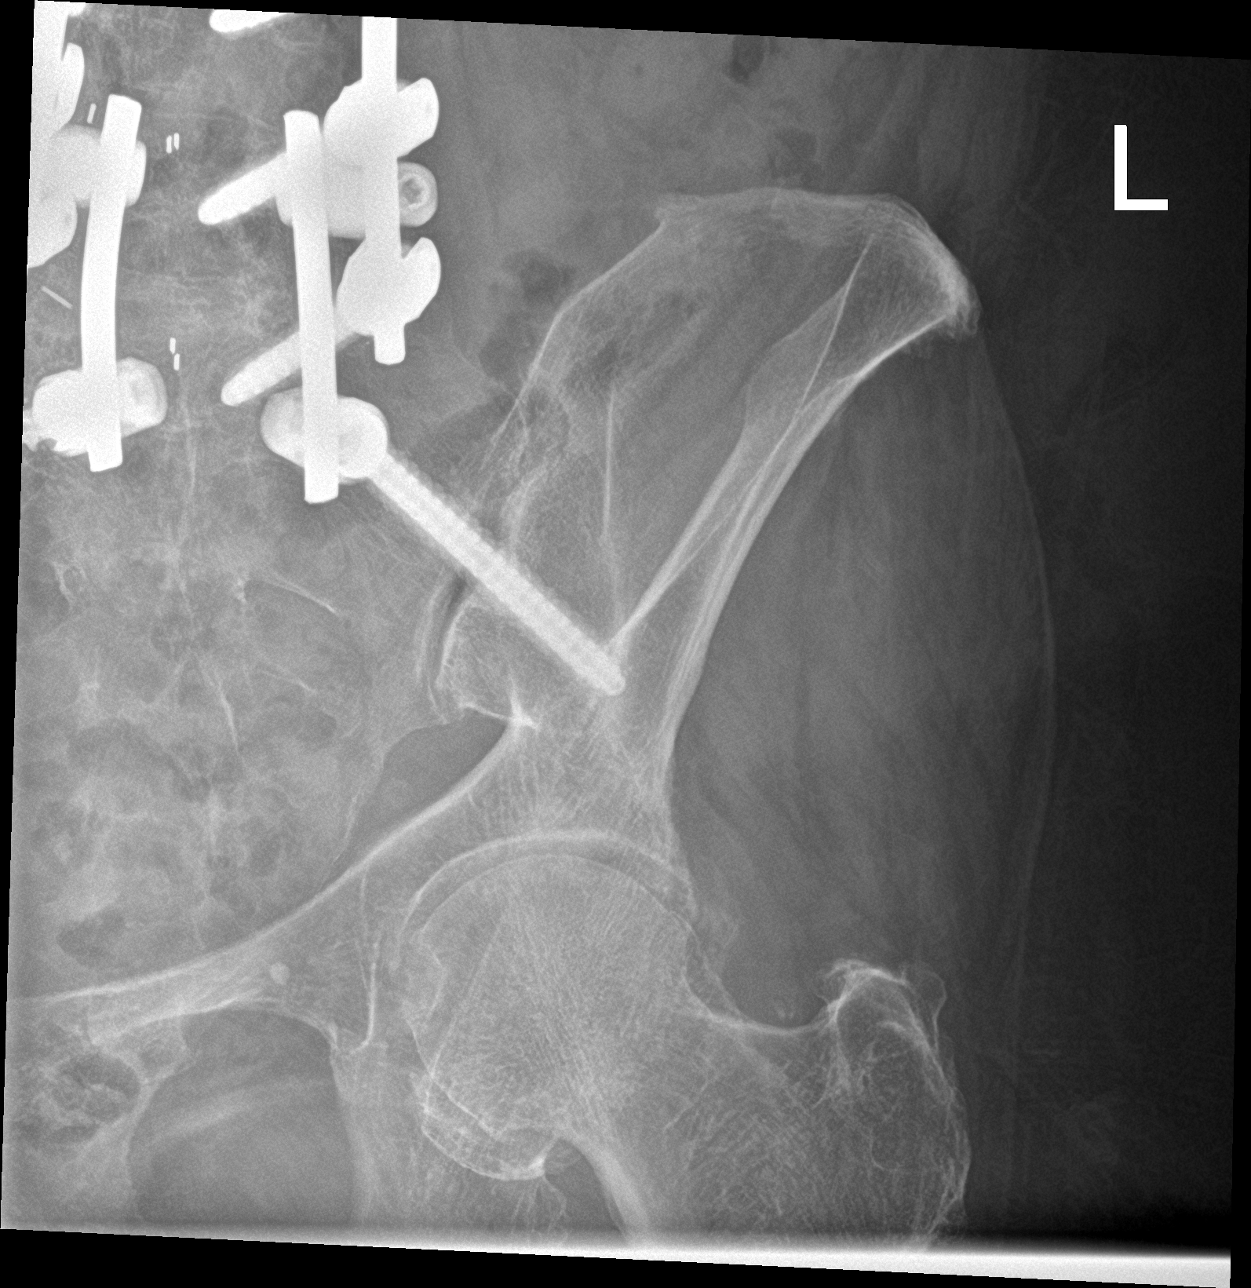

[3 of 3 positions shown; findings below may reference images not displayed]

FINDINGS: Postoperative changes in the lower lumbar spine with bilateral SI
screws. Degenerative changes in the hips bilaterally. No acute bony
abnormality. Specifically, no fracture, subluxation, or dislocation.
IMPRESSION: No acute bony abnormality.

## 2020-02-18 ENCOUNTER — Ambulatory Visit: Payer: Medicare Other | Admitting: Internal Medicine

## 2020-02-22 ENCOUNTER — Other Ambulatory Visit: Payer: Self-pay

## 2020-02-22 ENCOUNTER — Encounter: Payer: Self-pay | Admitting: Gastroenterology

## 2020-02-22 ENCOUNTER — Ambulatory Visit (INDEPENDENT_AMBULATORY_CARE_PROVIDER_SITE_OTHER): Payer: Medicare Other | Admitting: Gastroenterology

## 2020-02-22 VITALS — BP 132/78 | HR 92 | Temp 98.9°F | Ht 59.0 in | Wt 132.2 lb

## 2020-02-22 DIAGNOSIS — R1032 Left lower quadrant pain: Secondary | ICD-10-CM | POA: Diagnosis not present

## 2020-02-22 DIAGNOSIS — K5792 Diverticulitis of intestine, part unspecified, without perforation or abscess without bleeding: Secondary | ICD-10-CM | POA: Diagnosis not present

## 2020-02-22 DIAGNOSIS — Z1211 Encounter for screening for malignant neoplasm of colon: Secondary | ICD-10-CM | POA: Diagnosis not present

## 2020-02-22 DIAGNOSIS — G8929 Other chronic pain: Secondary | ICD-10-CM

## 2020-02-22 MED ORDER — METRONIDAZOLE 500 MG PO TABS: 500.0000 mg | ORAL_TABLET | Freq: Two times a day (BID) | ORAL | 0 refills | Status: AC

## 2020-02-22 MED ORDER — CIPROFLOXACIN HCL 500 MG PO TABS: 500.0000 mg | ORAL_TABLET | Freq: Two times a day (BID) | ORAL | 0 refills | Status: AC

## 2020-02-22 NOTE — Progress Notes (Signed)
Cephas Darby, MD [address]  [place]  [place], [place] [address]  Main: [phone]  Fax: [phone]    Gastroenterology Consultation  Referring Provider:     McLean-Scocuzza, Olivia Mackie * Primary Care Physician:  McLean-Scocuzza, Nino Glow, MD Primary Gastroenterologist:  Dr. Cephas Darby Reason for Consultation:     IBS, left lower quadrant pain        HPI:   Maria Spencer is a 74 y.o. female referred by Dr. Terese Door, Nino Glow, MD  for consultation & management of IBS. Patient reports having long-standing history of irritable bowel syndrome. She underwent back surgery in November since then she has been experiencing worsening of her IBS symptoms including abdominal cramps, severe gas/bloating which is mostly postprandial, cramps relieved after having a bowel movement. She reports having about 2 bowel movements daily without any blood. She does have bowel movement at night it is associated with straining, usually with flatus, although stools are not hard. She reports that she has been stressed recently with back surgery and also her husband with new diagnosis of metastatic bladder bladder cancer. She became tearful talking about her husband and about the stress. She reports that she tried probiotics which did not help. She tries to eat healthy, avoids red meat, carbonated beverages. She avoids gas producing foods as well. She also reports that she had an attack of diverticulitis last year and was given antibiotics. Her weight has been overall stable. Her hemoglobin was mildly low on most recent labs, normal MCV. She denies nausea, vomiting but reports loss of appetite and early satiety. She denies recent travel or antibiotic use or viral illness  Follow-up visit 02/22/2020 Patient reports that for last 6 months to 1 year, she has been experiencing postprandial abdominal cramps, bloating and urgency.  She reports having soft bowel movements to the abdomen after she eats.  Patient underwent CT renal stone study on 01/24/2020 for right-sided abdominal pain, microscopic hematuria and was found to have mild left-sided diverticulitis.  As she was not experiencing any symptoms, treatment was deferred at that time.  Today, she complains of left lower quadrant pain.  She denies nausea, vomiting.  Patient's husband passed away from cancer last year.  Currently, she lives alone in [place].  Her children live out of state.  NSAIDs: none  Antiplts/Anticoagulants/Anti thrombotics: none  GI Procedures:  Upper endoscopy 05/14/2014, LA grade A esophagitis, gastric erythema, biopsies of the GE junction and duodenum were performed Biopsies were unremarkable  Colonoscopy 01/10/2009 Diverticulosis, hemorrhoids Normal colon mucosa, biopsies performed Pathology report not available  Past Medical History:  Diagnosis Date  . Arthritis    neck, back, left knee;   . Breast cancer [facility]) 2009   right lumpectomy s/p radiation   . Chicken pox   . Chronic UTI    established with urology   . Diverticular disease    -osis and -itis   . Esophagitis    egd 05/17/14 see report scanned into chart  . Hormone disorder   . Hyperlipidemia   . Hypertension    controlled well with medication;   . Personal history of radiation therapy 2009   F/U right breast cancer  . Thyroid disease    hypothyroidism     Past Surgical History:  Procedure Laterality Date  . BONE GRAFT HIP ILIAC CREST     + cage left hip 10/23/17   . BREAST BIOPSY Left 2009   clip,benign  . BREAST BIOPSY Right 2009   +  .  BREAST LUMPECTOMY Right 2009   2009 lumpectomy   . CARDIAC CATHETERIZATION     No stents; [facility] doesn't recall facility +59yrs ago  . EYE SURGERY     b/l cataracts sch 10/2019 in [place]  . INSERTION OF MESH N/A 05/29/2018   Procedure: INSERTION OF MESH;  Surgeon: Johnathan Hausen, MD;  Location: [facility];  Service: General;  Laterality: N/A;  . SHOULDER SURGERY     x2 surgeries both  shoulders, right shoulder replacement last in 2012   . SPINE SURGERY  10/23/2017   spinal 09/2017 h/o scoliosis [facility] L4-S1 OLIF T10 to ililum fusion  . TONSILLECTOMY     age 66 y.o.   . TOTAL SHOULDER REPLACEMENT    . VENTRAL HERNIA REPAIR N/A 05/29/2018   Procedure: LAPAROSCOPIC VENTRAL / [place];  Surgeon: Johnathan Hausen, MD;  Location: [facility];  Service: General;  Laterality: N/A;    Current Outpatient Medications:  .  atorvastatin (LIPITOR) 10 MG tablet, Take 1 tablet (10 mg total) by mouth every other day. Note 10 mg no need to cut in 1/2 as before you have 20 mg taking 1/2 pill =10 mg, Disp: 90 tablet, Rfl: 3 .  Cholecalciferol (VITAMIN D3 PO), Take 1 capsule by mouth daily., Disp: , Rfl:  .  fluticasone (FLONASE) 50 MCG/ACT nasal spray, Place 2 sprays into both nostrils daily. Max b/l nostrils, Disp: 48 g, Rfl: 4 .  levothyroxine (SYNTHROID) 88 MCG tablet, Take 1 tablet (88 mcg total) by mouth daily before breakfast. 30 minutes, Disp: 90 tablet, Rfl: 3 .  lisinopril (ZESTRIL) 40 MG tablet, Take 1 tablet (40 mg total) by mouth daily., Disp: 90 tablet, Rfl: 0 .  Multiple Vitamin (MULTI-VITAMINS) TABS, Take 1 tablet by mouth daily. , Disp: , Rfl:  .  zolpidem (AMBIEN) 5 MG tablet, Take 1 tablet (5 mg total) by mouth at bedtime as needed for sleep., Disp: 90 tablet, Rfl: 1 .  ciprofloxacin (CIPRO) 500 MG tablet, Take 1 tablet (500 mg total) by mouth 2 (two) times daily for 14 days., Disp: 28 tablet, Rfl: 0 .  metroNIDAZOLE (FLAGYL) 500 MG tablet, Take 1 tablet (500 mg total) by mouth 2 (two) times daily for 14 days., Disp: 28 tablet, Rfl: 0 .  nitroGLYCERIN (NITROSTAT) 0.4 MG SL tablet, Place 1 tablet (0.4 mg total) under the tongue every 5 (five) minutes as needed for chest pain., Disp: 25 tablet, Rfl: 3   Family History  Problem Relation Age of Onset  . Arthritis Mother   . Heart disease Mother 100  . Hyperlipidemia Mother   . Hypertension Mother   . Coronary artery disease  Mother 37  . Arthritis Father   . Diabetes Father   . Cancer Father        colon  . AAA (abdominal aortic aneurysm) Father   . Arthritis Sister   . Hyperlipidemia Sister   . Hypertension Sister   . Cancer Brother        lung, smoker  . Mental retardation Brother   . Drug abuse Daughter        overdose in 2018   . Arthritis Sister   . Hyperlipidemia Sister   . Bladder Cancer Neg Hx   . Kidney cancer Neg Hx      Social History   Tobacco Use  . Smoking status: Never Smoker  . Smokeless tobacco: Never Used  Substance Use Topics  . Alcohol use: Not Currently    Alcohol/week: 7.0 standard  drinks    Types: 7 Glasses of wine per week  . Drug use: No    Allergies as of 02/22/2020 - Review Complete 02/07/2020  Allergen Reaction Noted  . Sulfa antibiotics Anaphylaxis, Hives, Itching, Shortness Of Breath, and Swelling 06/13/2015  . Nitrofuran derivatives Swelling 01/05/2020  . Amlodipine besylate Other (See Comments) 09/10/2019  . Nitrofurantoin Hives and Other (See Comments) 06/13/2011    Review of Systems:    All systems reviewed and negative except where noted in HPI.   Physical Exam:  BP 132/78   Pulse 92   Temp 98.9 F (37.2 C) (Temporal)   Ht 4\' 11"  (1.499 m)   Wt 132 lb 3.2 oz (60 kg)   BMI 26.70 kg/m  No LMP recorded. Patient is postmenopausal.  General:   Alert,  Well-developed, well-nourished, pleasant and cooperative in NAD Head:  Normocephalic and atraumatic. Eyes:  Sclera clear, no icterus.   Conjunctiva pink. Ears:  Normal auditory acuity. Nose:  No deformity, discharge, or lesions. Mouth:  No deformity or lesions,oropharynx pink & moist. Neck:  Supple; no masses or thyromegaly. Lungs:  Respirations even and unlabored.  Clear throughout to auscultation.   No wheezes, crackles, or rhonchi. No acute distress. Heart:  Regular rate and rhythm; no murmurs, clicks, rubs, or gallops. Abdomen:  Normal bowel sounds. Soft, mild suprapubic tenderness and  non-distended without masses, hepatosplenomegaly or hernias noted.  No guarding or rebound tenderness.   Rectal: Not performed Msk:  Symmetrical without gross deformities. Good, equal movement & strength bilaterally. Pulses:  Normal pulses noted. Extremities:  No clubbing or edema.  No cyanosis. Neurologic:  Alert and oriented x3;  grossly normal neurologically. Skin:  Intact without significant lesions or rashes. No jaundice. Lymph Nodes:  No significant cervical adenopathy. Psych:  Alert and cooperative. Normal mood and affect.  Imaging Studies: No abdominal imaging  Assessment and Plan:   Ashleylynn Bezanson is a 74 y.o. female with long-standing history of IBS, back surgery presents with postprandial abdominal cramps, severe gas/bloating, left lower quadrant pain, postprandial urgency.  Recent CT revealed left-sided diverticulitis  Recommend GI profile PCR to evaluate for any infectious etiology Will start 2 weeks course of Cipro and Flagyl to treat left-sided diverticulitis since patient is symptomatic after patient provides stool sample Recommend screening colonoscopy end of April or early May after treatment of diverticulitis  Recheck iron studies and B12 placed folate during next visit  Follow up in 2 months   Cephas Darby, MD

## 2020-02-23 ENCOUNTER — Other Ambulatory Visit: Payer: Self-pay

## 2020-02-23 DIAGNOSIS — R1013 Epigastric pain: Secondary | ICD-10-CM | POA: Diagnosis not present

## 2020-02-23 DIAGNOSIS — Z1211 Encounter for screening for malignant neoplasm of colon: Secondary | ICD-10-CM

## 2020-02-23 DIAGNOSIS — K5792 Diverticulitis of intestine, part unspecified, without perforation or abscess without bleeding: Secondary | ICD-10-CM | POA: Diagnosis not present

## 2020-02-25 LAB — GI PROFILE, STOOL, PCR

## 2020-03-01 DIAGNOSIS — M25512 Pain in left shoulder: Secondary | ICD-10-CM | POA: Diagnosis not present

## 2020-03-01 DIAGNOSIS — M1712 Unilateral primary osteoarthritis, left knee: Secondary | ICD-10-CM | POA: Diagnosis not present

## 2020-03-12 DIAGNOSIS — M25551 Pain in right hip: Secondary | ICD-10-CM | POA: Diagnosis not present

## 2020-03-12 DIAGNOSIS — S7001XA Contusion of right hip, initial encounter: Secondary | ICD-10-CM | POA: Diagnosis not present

## 2020-03-12 DIAGNOSIS — M1611 Unilateral primary osteoarthritis, right hip: Secondary | ICD-10-CM | POA: Diagnosis not present

## 2020-03-20 ENCOUNTER — Ambulatory Visit: Payer: Self-pay | Admitting: Urology

## 2020-03-27 ENCOUNTER — Other Ambulatory Visit: Payer: Self-pay

## 2020-03-27 ENCOUNTER — Ambulatory Visit (INDEPENDENT_AMBULATORY_CARE_PROVIDER_SITE_OTHER): Payer: Medicare Other | Admitting: Urology

## 2020-03-27 ENCOUNTER — Encounter: Payer: Self-pay | Admitting: Urology

## 2020-03-27 VITALS — BP 156/82 | HR 86 | Ht 59.0 in | Wt 132.0 lb

## 2020-03-27 DIAGNOSIS — N3020 Other chronic cystitis without hematuria: Secondary | ICD-10-CM

## 2020-03-27 LAB — URINALYSIS, COMPLETE
Bilirubin, UA: NEGATIVE
Glucose, UA: NEGATIVE
Ketones, UA: NEGATIVE
Nitrite, UA: NEGATIVE
Protein,UA: NEGATIVE
Specific Gravity, UA: 1.025 (ref 1.005–1.030)
Urobilinogen, Ur: 0.2 mg/dL (ref 0.2–1.0)
pH, UA: 7 (ref 5.0–7.5)

## 2020-03-27 LAB — MICROSCOPIC EXAMINATION
Bacteria, UA: NONE SEEN
WBC, UA: >30 /hpf — AB (ref 0–5)

## 2020-03-27 NOTE — Progress Notes (Signed)
03/27/2020 9:33 AM   Maria Spencer 12/12/1945 [id number]  Referring provider: McLean-Scocuzza, Nino Glow, MD [place],  [place] [address]  Chief Complaint  Patient presents with  . Follow-up    HPI: The patient was consulted to [place] for recurrent urinary tract infections. I went through some of her medical records. She has had positive cultures resistant to antibiotics. She had been placed on trimethoprim for 90 days and had a sling 25 years ago. She had a negative CAT scan in May 2016. She is noted to have a small cystocele.  The patient describes pressure and bloating and small volume frequency that generally respond to antibiotics. She can get an infection every several weeks. She was on Macrodantin daily for 30 days. 10 days after stopping it she once again had another infection  At baseline she voids every 2 hours and is continent. She gets up 3-4 times a night and denies ankle edema and does not take Lasix.  The pathophysiology of recurrent urinary tract infections in the need for long-term prophylaxis on a daily basis discussed. I will send her urine for culture today. In the future I would re-baseline her nighttime frequency and treatment goals.   The patient has little to no symptoms and possibly some mild fullness from her cystocele. The patient thought that maybe trimethoprim did not work as well but I think she was self treating herself as opposed to taking it prophylactically. Having said that we gave her 80-month supply of daily Macrodantin with 3 refills and I will reassess her in about 10 weeks after her back surgery.I forgot to mention estrogen cream but I did mention probiotics  Today Patient describes 6 or 7 bladder infections in the last year.  She will get difficulty voiding and burning and heaviness that respond usually to antibiotics.  She has finished an antibiotic and she is not for certain if it is totally cleared.  She describes  breakthrough infections on the daily Macrodantin so was asked to stop it due to breakthrough resistant organisms  I reviewed medical records and she has had multiple positive cultures.  She had a recent CT scan of the abdomen and normal kidneys  She has a severe reaction to sulfa drug and I think is best not to try trimethoprim which may have worked in the past but she was not necessarily taking it daily.  And now looks like she might be allergic to Macrodantin when I reviewed her allergies.  She understands her relative treatment options and we talked about daily Keflex.  I also mentioned local estrogen cream probiotics and cranberry  Reassess in 8 weeks on daily Keflex.  Call if urine culture is positive.  She might be interested in taking estrogen cream but will speak to her breast cancer provider before every doing so  Today Frequency stable.  Last urine culture negative Clinically was infection free on Keflex.  She developed thrush and treated herself with diet changes and mouthwash.  Last night she thought she was getting infected and went back on the daily Keflex and feels good today.  If she is able to give a sample we will send it for culture.  Otherwise I will check on her in 4 months.  Asymptomatic today for infection    PMH: Past Medical History:  Diagnosis Date  . Arthritis    neck, back, left knee;   . Breast cancer [facility]) 2009   right lumpectomy s/p radiation   . Chicken pox   .  Chronic UTI    established with urology   . Diverticular disease    -osis and -itis   . Esophagitis    egd 05/17/14 see report scanned into chart  . Hormone disorder   . Hyperlipidemia   . Hypertension    controlled well with medication;   . Personal history of radiation therapy 2009   F/U right breast cancer  . Thyroid disease    hypothyroidism     Surgical History: Past Surgical History:  Procedure Laterality Date  . BONE GRAFT HIP ILIAC CREST     + cage left hip 10/23/17   . BREAST  BIOPSY Left 2009   clip,benign  . BREAST BIOPSY Right 2009   +  . BREAST LUMPECTOMY Right 2009   2009 lumpectomy   . CARDIAC CATHETERIZATION     No stents; [facility] doesn't recall facility +32yrs ago  . EYE SURGERY     b/l cataracts sch 10/2019 in [place]  . INSERTION OF MESH N/A 05/29/2018   Procedure: INSERTION OF MESH;  Surgeon: Johnathan Hausen, MD;  Location: [facility];  Service: General;  Laterality: N/A;  . SHOULDER SURGERY     x2 surgeries both shoulders, right shoulder replacement last in 2012   . SPINE SURGERY  10/23/2017   spinal 09/2017 h/o scoliosis [facility] L4-S1 OLIF T10 to ililum fusion  . TONSILLECTOMY     age 36 y.o.   . TOTAL SHOULDER REPLACEMENT    . VENTRAL HERNIA REPAIR N/A 05/29/2018   Procedure: LAPAROSCOPIC VENTRAL / [employer];  Surgeon: Johnathan Hausen, MD;  Location: [facility];  Service: General;  Laterality: N/A;    Home Medications:  Allergies as of 03/27/2020      Reactions   Sulfa Antibiotics Anaphylaxis, Hives, Itching, Shortness Of Breath, Swelling   Lips & throat swelled   Nitrofuran Derivatives Swelling   Lip swelling; "eyes get red and puffy"   Amlodipine Besylate Other (See Comments)   Gas, bloating  Gas, bloating    Nitrofurantoin Hives, Other (See Comments)      Medication List       Accurate as of Mar 27, 2020  9:33 AM. If you have any questions, ask your nurse or doctor.        STOP taking these medications   nitroGLYCERIN 0.4 MG SL tablet Commonly known as: NITROSTAT Stopped by: Reece Packer, MD   Tymlos 3120 MCG/1.56ML Sopn Generic drug: Abaloparatide Stopped by: Reece Packer, MD     TAKE these medications   atorvastatin 10 MG tablet Commonly known as: LIPITOR Take 1 tablet (10 mg total) by mouth every other day. Note 10 mg no need to cut in 1/2 as before you have 20 mg taking 1/2 pill =10 mg   cephALEXin 250 MG capsule Commonly known as: KEFLEX Take 250 mg by mouth daily.   fluticasone 50 MCG/ACT nasal  spray Commonly known as: FLONASE Place 2 sprays into both nostrils daily. Max b/l nostrils   Forteo 600 MCG/2.4ML Sopn Generic drug: Teriparatide (Recombinant) What changed: Another medication with the same name was removed. Continue taking this medication, and follow the directions you see here. Changed by: Reece Packer, MD   levothyroxine 88 MCG tablet Commonly known as: SYNTHROID Take 1 tablet (88 mcg total) by mouth daily before breakfast. 30 minutes   lisinopril 40 MG tablet Commonly known as: ZESTRIL Take 1 tablet (40 mg total) by mouth daily.   Multi-Vitamins Tabs Take 1 tablet by mouth daily.  VITAMIN D3 PO Take 1 capsule by mouth daily.   zolpidem 5 MG tablet Commonly known as: AMBIEN Take 1 tablet (5 mg total) by mouth at bedtime as needed for sleep.       Allergies:  Allergies  Allergen Reactions  . Sulfa Antibiotics Anaphylaxis, Hives, Itching, Shortness Of Breath and Swelling    Lips & throat swelled  . Nitrofuran Derivatives Swelling    Lip swelling; "eyes get red and puffy"  . Amlodipine Besylate Other (See Comments)    Gas, bloating   Gas, bloating   . Nitrofurantoin Hives and Other (See Comments)    Family History: Family History  Problem Relation Age of Onset  . Arthritis Mother   . Heart disease Mother 58  . Hyperlipidemia Mother   . Hypertension Mother   . Coronary artery disease Mother 11  . Arthritis Father   . Diabetes Father   . Cancer Father        colon  . AAA (abdominal aortic aneurysm) Father   . Arthritis Sister   . Hyperlipidemia Sister   . Hypertension Sister   . Cancer Brother        lung, smoker  . Mental retardation Brother   . Drug abuse Daughter        overdose in 2018   . Arthritis Sister   . Hyperlipidemia Sister   . Bladder Cancer Neg Hx   . Kidney cancer Neg Hx     Social History:  reports that she has never smoked. She has never used smokeless tobacco. She reports previous alcohol use of about 7.0  standard drinks of alcohol per week. She reports that she does not use drugs.  ROS:                                        Physical Exam: BP (!) 156/82   Pulse 86   Ht 4\' 11"  (1.499 m)   Wt 59.9 kg   BMI 26.66 kg/m   Constitutional:  Alert and oriented, No acute distress.   Laboratory Data: Lab Results  Component Value Date   WBC 4.8 01/05/2020   HGB 13.7 01/05/2020   HCT 43.1 01/05/2020   MCV 88.0 01/05/2020   PLT 211 01/05/2020    Lab Results  Component Value Date   CREATININE 0.51 11/01/2019    No results found for: PSA  No results found for: TESTOSTERONE  Lab Results  Component Value Date   HGBA1C 5.6 09/10/2019    Urinalysis    Component Value Date/Time   COLORURINE YELLOW 10/20/2019 1400   APPEARANCEUR Hazy (A) 02/07/2020 1421   LABSPEC 1.018 10/20/2019 1400   PHURINE 5.5 10/20/2019 1400   GLUCOSEU Negative 02/07/2020 1421   GLUCOSEU NEGATIVE 09/22/2018 1416   HGBUR TRACE (A) 10/20/2019 1400   BILIRUBINUR Negative 02/07/2020 1421   KETONESUR TRACE (A) 10/20/2019 1400   PROTEINUR Negative 02/07/2020 1421   PROTEINUR TRACE (A) 10/20/2019 1400   UROBILINOGEN 1.0 01/24/2020 1107   UROBILINOGEN 0.2 09/22/2018 1416   NITRITE Negative 02/07/2020 1421   NITRITE POSITIVE (A) 10/20/2019 1400   LEUKOCYTESUR Trace (A) 02/07/2020 1421   LEUKOCYTESUR 2+ (A) 10/20/2019 1400    Pertinent Imaging:   Assessment & Plan: Reassess 4 months  There are no diagnoses linked to this encounter.  No follow-ups on file.  Reece Packer, MD  [place] [address],  [place], [place] [address] [phone]

## 2020-03-27 NOTE — Progress Notes (Signed)
BP elevated  Is she agreeable to beta blocker to control BP further?  Please sch f/u in 3 months  [place]

## 2020-03-28 ENCOUNTER — Other Ambulatory Visit: Payer: Self-pay | Admitting: Internal Medicine

## 2020-03-28 ENCOUNTER — Telehealth: Payer: Self-pay | Admitting: Internal Medicine

## 2020-03-28 DIAGNOSIS — I10 Essential (primary) hypertension: Secondary | ICD-10-CM

## 2020-03-28 MED ORDER — METOPROLOL SUCCINATE ER 25 MG PO TB24: 25.0000 mg | ORAL_TABLET | Freq: Every day | ORAL | 3 refills | Status: DC

## 2020-03-28 NOTE — Telephone Encounter (Signed)
Sent toprol 25 mg xl daily  [place]

## 2020-03-28 NOTE — Telephone Encounter (Signed)
-----   Message from Delorise Jackson, MD sent at 03/27/2020 10:22 AM EDT -----   ----- Message ----- From: Bjorn Loser, MD Sent: 03/27/2020   9:45 AM EDT To: Nino Glow McLean-Scocuzza, MD

## 2020-03-28 NOTE — Telephone Encounter (Signed)
BP elevated  Is she agreeable to beta blocker to control BP further?  Please sch f/u in 3 months  [place]  Left message to return call.

## 2020-03-28 NOTE — Telephone Encounter (Signed)
Pt is agreeable to take a Beta blocker along with her other BP medication. She would like a90 day supply sent to her mail order pharmacy.   Pt scheduled for 8/17 at 9:30 am.

## 2020-03-29 LAB — CULTURE, URINE COMPREHENSIVE

## 2020-04-01 ENCOUNTER — Other Ambulatory Visit: Payer: Self-pay | Admitting: Internal Medicine

## 2020-04-14 ENCOUNTER — Other Ambulatory Visit: Payer: Self-pay | Admitting: Internal Medicine

## 2020-04-14 MED ORDER — ATORVASTATIN CALCIUM 10 MG PO TABS: 10.0000 mg | ORAL_TABLET | ORAL | 3 refills | Status: DC

## 2020-04-18 ENCOUNTER — Ambulatory Visit: Payer: Medicare Other | Admitting: Gastroenterology

## 2020-04-21 DIAGNOSIS — M25551 Pain in right hip: Secondary | ICD-10-CM | POA: Diagnosis not present

## 2020-04-21 DIAGNOSIS — M7071 Other bursitis of hip, right hip: Secondary | ICD-10-CM | POA: Diagnosis not present

## 2020-04-25 ENCOUNTER — Other Ambulatory Visit: Payer: Self-pay | Admitting: Internal Medicine

## 2020-04-25 ENCOUNTER — Telehealth: Payer: Self-pay | Admitting: Internal Medicine

## 2020-04-25 DIAGNOSIS — G4700 Insomnia, unspecified: Secondary | ICD-10-CM

## 2020-04-25 DIAGNOSIS — Z1231 Encounter for screening mammogram for malignant neoplasm of breast: Secondary | ICD-10-CM

## 2020-04-25 MED ORDER — ZOLPIDEM TARTRATE 5 MG PO TABS: 5.0000 mg | ORAL_TABLET | Freq: Every evening | ORAL | 1 refills | Status: DC | PRN

## 2020-04-25 NOTE — Telephone Encounter (Signed)
Refill request for Maria Spencer, last seen 01-14-20, last filled 12-01-19.  Please advise.

## 2020-04-25 NOTE — Telephone Encounter (Signed)
Pt needs a refill on zolpidem (AMBIEN) 5 MG tablet sent [facility]

## 2020-05-07 NOTE — Progress Notes (Signed)
Patient: Maria Spencer  Service Category: E/M  Provider: Gaspar Cola, MD  DOB: [birth year]  DOS: 05/08/2020  Referring Provider: Meade Maw, MD  MRN: [id number]  Setting: Ambulatory outpatient  PCP: McLean-Scocuzza, Nino Glow, MD  Type: New Patient  Specialty: Interventional Pain Management    Location: Office  Delivery: Face-to-face     Primary Reason(s) for Visit: Encounter for initial evaluation of one or more chronic problems (new to examiner) potentially causing chronic pain, and posing a threat to normal musculoskeletal function. (Level of risk: High) CC: New Patient (Initial Visit)  HPI  Maria Spencer is a 74 y.o. year old, female patient, who comes today to see [place] for the first time for an initial evaluation of her chronic pain. 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/p reduction; Scoliosis of thoracolumbar spine s/p T10-ilium fusion 10/20/2017; Chronic hip pain (2ry area of Pain) (Bilateral) (R>L); Mid back pain; Aortic atherosclerosis ([place]); Osteoporosis; Chronic low back pain with sciatica; Chronic thoracic back pain (1ry area of Pain) (Bilateral) (R>L); Age-related nuclear cataract of right eye; Fusion of spine of lumbosacral region; Lumbosacral spondylosis with radiculopathy; S/P LASIK (laser assisted in situ keratomileusis) of both eyes; Age-related nuclear cataract of left eye; Chronic pain syndrome; Pharmacologic therapy; Disorder of skeletal system; Problems influencing health status; Elevated blood pressure reading with diagnosis of hypertension; Allergic rhinitis; Essential hypertension; Chronic  lower extremity pain (3ry area of Pain) (Right); Chronic knee pain (4th area of Pain) (Posterior) (Left); Closed wedge compression fracture of T8 vertebra ([place]); Thoracic facet syndrome; and Chronic sacroiliac joint pain (Bilateral) on their problem list. Today she comes in for evaluation of her New Patient (Initial Visit)  Pain Assessment: Location: Mid Back Radiating: From lower back into right leg/side of right leg to ankle. Onset: More than a month ago Duration: Chronic pain Quality: Burning, Constant, Discomfort, Other (Comment) (exhausting, feeling of constriction) Severity: 6 /10 (subjective, self-reported pain score)  Note: Reported level is compatible with observation.                         When using our objective Pain Scale, levels between 6 and 10/10 are said to belong in an emergency room, as it progressively worsens from a 6/10, described as severely limiting, requiring emergency care not usually available at an outpatient pain management facility. At a 6/10 level, communication becomes difficult and requires great effort. Assistance to reach the emergency department may be required. Facial flushing and profuse sweating along with potentially dangerous increases in heart rate and blood pressure will be evident. Effect on ADL: "pretty bad, unable too do anything anymore. trip over my feet when walking" Timing: Constant Modifying factors: streching BP: (!) 147/71  HR: 87  Onset and Duration: Gradual Cause of pain: Unknown Severity: Getting worse, NAS-11 at its worse: 7/10, NAS-11 at its best: 3/10, NAS-11 now: 5/10 and NAS-11 on the average: 4/10 Timing: Not influenced by the time of the day Aggravating Factors: Bowel movements, Climbing, Surgery made it worse, Twisting and Walking Alleviating Factors: Stretching Associated Problems: Fatigue, Weakness and Pain that wakes patient up Quality of Pain: Constant, Deep, Disabling, Exhausting and Feeling of constriction Previous  Examinations or  Tests: Bone scan, CT scan, Ct-Myelogram and Orthopedic evaluation Previous Treatments: Epidural steroid injections  The patient comes into the clinics today for the first time for a chronic pain management evaluation.  According to the patient the primary area of pain is that of the mid thoracic region, just below her bra.  She describes the pain to be bilateral (R>L).  The patient does admit to a right thoracic radicular pain that seems to be sporadic.  The patient admits to thoracolumbar surgery for the purpose of correcting her scoliosis.  She denies any interventional pain management treatments.  Her history is significant for having mid thoracic back pain, low back pain, right lower extremity pain, left knee pain.  She indicates having to start using a cane approximately 1-1/2 months ago due to balance problems.  She refers currently awaiting for surgery to improve her back pain further.  However, she was found to have some severe osteoporosis where she is apparently -3.7 and they need her to be -2.0.  Her surgeon is Dr. Dixie Dials and she has been getting daily injections for the treatment of her osteoporosis.  She thinks that she might have messed her disc just above the fusion and she has been told that she will need to have an extension of her current fusion which to start with is rather extensive.  This initial fusion was done for the purpose of correcting her scoliosis.  She did try some physical therapy, but it did not work and she was given a back brace which had to be refitted 3 times and still does not work well for her secondary to her size.  She indicates not wanting to use any type of pain medication since her daughter apparently died of an overdose using fentanyl.  She does admit having had some shots done in [facility] by [facility] but according to her they "hurt like hell".  However, she does indicate that the first 1 did help for a while but the second 1 did not  and that when she had the surgery.  The surgery was done at [facility] but she states that she does not want to go back to that surgeon.  That particular surgery was done in 2018 and she refers having done well after that surgery.  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  According to the patient because of her extensive hardware in the back, she could not have an MRI and ended up having a lumbar CT myelogram.  The patient's secondary area pain is that of the right hip and buttocks.  She denies any hip surgery and approximately 1 month ago she had an injection done by [facility] urgent care on a Sunday.  She states that it did not help.  The patient's third area pain is that of the right lower extremity where the pain goes down but does not reach her foot.  It apparently goes down to the ankle through the back of the knee then it wraps around to the lateral aspect of the distal leg down to the ankle where it stops.  The patient's fourth area pain is that of the left knee with the pain being strictly behind the knee and she was told that she has a Bakers cyst.  The patient also indicates having had a left knee injection done recently by urgent care [facility] [address].  She states that  that one seem to have helped considerably.  Provocative exams done today show the patient to have pain coming from both hips when performing the [facility] maneuver with significant decreased range of motion of both hips.  She denies any type of hip arthropathies.  In addition, the patient has decreased straight leg raise bilaterally negative for radiculopathy but positive for pain in the area of the hips.  DTRs were within normal limits and equal bilaterally for the Achilles tendon and the patellar tendon.  Hyperextension on rotation proved to be positive with exact reproduction of the pain, bilaterally  and the area of the mid back.  In reviewing the patient's x-rays I noticed that she had x-rays of the thoracolumbar spine where he was diagnosed as the patient not having any vertebral body fractures however, 4 months later on the patient's CT scan they describe a T8 vertebral body fracture, that they initially described as chronic however findings that can be adjudicated to a chronic versus acute fracture usually seen better on MRI than on CT.  The fact that there was no description of a T8 fracture or any other type of fractures in the thoracic spine on the x-rays and then there later described on the CT would suggest that the T8 vertebral body fracture is actually fairly new.  This would explain some of the patient's pain in that area and if this is the case, she may not necessarily need a fusion of the area but more so a T8 kyphoplasty.  In any case, the patient is looking for an alternative treatment for her thoracic back pain and at this point I will start by doing some diagnostic thoracic facet blocks and if that does not help, we will consider a thoracic epidural steroid injection.  However, I do believe that the patient should also have this reevaluated by neurosurgery to see if they want to go ahead and do a kyphoplasty of T8 which should help stabilize that fracture and at the same time the exothermic reaction from the methylmethacrylate may help eliminate her pain.  Today I took the time to provide the patient with information regarding my pain practice. The patient was informed that my practice is divided into two sections: an interventional pain management section, as well as a completely separate and distinct medication management section. I explained that I have procedure days for my interventional therapies, and evaluation days for follow-ups and medication management. Because of the amount of documentation required during both, they are kept separated. This means that there is the possibility  that she may be scheduled for a procedure on one day, and medication management the next. I have also informed her that because of staffing and facility limitations, I no longer take patients for medication management only. To illustrate the reasons for this, I gave the patient the example of surgeons, and how inappropriate it would be to refer a patient to his/her care, just to write for the post-surgical antibiotics on a surgery done by a different surgeon.   Because interventional pain management is my board-certified specialty, the patient was informed that joining my practice means that they are open to any and all interventional therapies. I made it clear that this does not mean that they will be forced to have any procedures done. What this means is that I believe interventional therapies to be essential part of the diagnosis and proper management of chronic pain conditions. Therefore, patients not interested in these interventional alternatives will be better  served under the care of a different practitioner.  The patient was also made aware of my Comprehensive Pain Management Safety Guidelines where by joining my practice, they limit all of their nerve blocks and joint injections to those done by our practice, for as long as we are retained to manage their care.   Historic Controlled Substance Pharmacotherapy Review  PMP and historical list of controlled substances: Zolpidem 5 mg; lorazepam 0.5 mg; oxycodone/APAP 5/325 Highest opioid analgesic regimen found: Oxycodone/APAP 5/325, 10 tablets/day x 3 days #30 (50 mg/day of oxycodone) (75 MME/day) (last prescribed on 05/30/2018)  Most recent opioid analgesic: Oxycodone/APAP 5/325, 10 tablets/day x 3 days #30 (50 mg/day of oxycodone) (75 MME/day) (last prescribed on 05/30/2018)   Current opioid analgesics:  None Highest recorded MME/day: 75 mg/day MME/day: 0 mg/day  Medications: The patient did not bring the medication(s) to the appointment, as  requested in our "New Patient Package" Pharmacodynamics: Desired effects: Analgesia: The patient reports >50% benefit. Reported improvement in function: The patient reports medication allows her to accomplish basic ADLs. Clinically meaningful improvement in function (CMIF): Sustained CMIF goals met Perceived effectiveness: Described as relatively effective, allowing for increase in activities of daily living (ADL) Undesirable effects: Side-effects or Adverse reactions: None reported Historical Monitoring: The patient  reports no history of drug use. List of all UDS Test(s): No results found for: MDMA, COCAINSCRNUR, [place], [place], CANNABQUANT, THCU, [place] List of other Serum/Urine Drug Screening Test(s):  No results found for: AMPHSCRSER, BARBSCRSER, BENZOSCRSER, COCAINSCRSER, COCAINSCRNUR, PCPSCRSER, PCPQUANT, THCSCRSER, THCU, CANNABQUANT, OPIATESCRSER, OXYSCRSER, PROPOXSCRSER, ETH Historical Background Evaluation: [place] PMP: PDMP reviewed during this encounter. Six (6) year initial data search conducted.             PMP NARX Score Report:  Narcotic: 110 Sedative: 181 Stimulant: 000  Department of public safety, offender search: Editor, commissioning Information) Non-contributory Risk Assessment Profile: Aberrant behavior: None observed or detected today Risk factors for fatal opioid overdose: None identified today PMP NARX Overdose Risk Score: 370 Fatal overdose hazard ratio (HR): Calculation deferred Non-fatal overdose hazard ratio (HR): Calculation deferred Risk of opioid abuse or dependence: 0.7-3.0% with doses ? 36 MME/day and 6.1-26% with doses ? 120 MME/day. Substance use disorder (SUD) risk level: See below Personal History of Substance Abuse (SUD-Substance use disorder):  Alcohol: Negative  Illegal Drugs: Negative  Rx Drugs: Negative  ORT Risk Level calculation: Low Risk  Opioid Risk Tool - 05/08/20 1004      Family History of Substance Abuse   Alcohol Negative    Illegal Drugs  Positive Female    Rx Drugs Negative      Personal History of Substance Abuse   Alcohol Negative    Illegal Drugs Negative    Rx Drugs Negative      Age   Age between 38-45 years  No      History of Preadolescent Sexual Abuse   History of Preadolescent Sexual Abuse Negative or Female      Psychological Disease   Psychological Disease Negative    Depression Negative      Total Score   Opioid Risk Tool Scoring 2    Opioid Risk Interpretation Low Risk          ORT Scoring interpretation table:  Score <3 = Low Risk for SUD  Score between 4-7 = Moderate Risk for SUD  Score >8 = High Risk for Opioid Abuse   PHQ-2 Depression Scale:  Total score: 0  PHQ-2 Scoring interpretation table: (Score and probability of  major depressive disorder)  Score 0 = No depression  Score 1 = 15.4% Probability  Score 2 = 21.1% Probability  Score 3 = 38.4% Probability  Score 4 = 45.5% Probability  Score 5 = 56.4% Probability  Score 6 = 78.6% Probability   PHQ-9 Depression Scale:  Total score: 0  PHQ-9 Scoring interpretation table:  Score 0-4 = No depression  Score 5-9 = Mild depression  Score 10-14 = Moderate depression  Score 15-19 = Moderately severe depression  Score 20-27 = Severe depression (2.4 times higher risk of SUD and 2.89 times higher risk of overuse)   Pharmacologic Plan: As per protocol, I have not taken over any controlled substance management, pending the results of ordered tests and/or consults.            Initial impression: Pending review of available data and ordered tests.  Meds   Current Outpatient Medications:  .  atorvastatin (LIPITOR) 10 MG tablet, Take 1 tablet (10 mg total) by mouth every other day. Note 10 mg no need to cut in 1/2 as before you have 20 mg taking 1/2 pill =10 mg, Disp: 90 tablet, Rfl: 3 .  cephALEXin (KEFLEX) 250 MG capsule, Take 250 mg by mouth daily., Disp: , Rfl:  .  Cholecalciferol (VITAMIN D3 PO), Take 1 capsule by mouth daily., Disp: , Rfl:   .  fluticasone (FLONASE) 50 MCG/ACT nasal spray, Place 2 sprays into both nostrils daily. Max b/l nostrils, Disp: 48 g, Rfl: 4 .  FORTEO 600 MCG/2.4ML SOPN, , Disp: , Rfl:  .  levothyroxine (SYNTHROID) 88 MCG tablet, Take 1 tablet (88 mcg total) by mouth daily before breakfast. 30 minutes, Disp: 90 tablet, Rfl: 3 .  lisinopril (ZESTRIL) 40 MG tablet, TAKE 1 TABLET DAILY, Disp: 90 tablet, Rfl: 0 .  metoprolol succinate (TOPROL-XL) 25 MG 24 hr tablet, Take 1 tablet (25 mg total) by mouth daily., Disp: 90 tablet, Rfl: 3 .  Multiple Vitamin (MULTI-VITAMINS) TABS, Take 1 tablet by mouth daily. , Disp: , Rfl:  .  nitroGLYCERIN (NITROSTAT) 0.4 MG SL tablet, Place under the tongue., Disp: , Rfl:  .  zolpidem (AMBIEN) 5 MG tablet, Take 1 tablet (5 mg total) by mouth at bedtime as needed for sleep., Disp: 90 tablet, Rfl: 1 .  SUPREP BOWEL PREP KIT 17.5-3.13-1.6 GM/177ML SOLN, SMARTSIG:354 Milliliter(s) By Mouth As Directed, Disp: , Rfl:   Imaging Review  Cervical Imaging: Cervical MR wo contrast: No results found for this or any previous visit.  Cervical MR wo contrast: No valid procedures specified. Cervical MR w/wo contrast: No results found for this or any previous visit.  Cervical MR w contrast: No results found for this or any previous visit.  Cervical CT wo contrast: No results found for this or any previous visit.  Cervical CT w/wo contrast: No results found for this or any previous visit.  Cervical CT w/wo contrast: No results found for this or any previous visit.  Cervical CT w contrast: No results found for this or any previous visit.  Cervical CT outside: No results found for this or any previous visit.  Cervical DG 1 view: No results found for this or any previous visit.  Cervical DG 2-3 views: No results found for this or any previous visit.  Cervical DG F/E views: No results found for this or any previous visit.  Cervical DG 2-3 clearing views: No results found for this or  any previous visit.  Cervical DG Bending/F/E views: No results found  for this or any previous visit.  Cervical DG complete: No results found for this or any previous visit.  Cervical DG Myelogram views: No results found for this or any previous visit.  Cervical DG Myelogram views: No results found for this or any previous visit.  Cervical Discogram views: No results found for this or any previous visit.   Shoulder Imaging: Shoulder-R MR w contrast: No results found for this or any previous visit.  Shoulder-L MR w contrast: No results found for this or any previous visit.  Shoulder-R MR w/wo contrast: No results found for this or any previous visit.  Shoulder-L MR w/wo contrast: No results found for this or any previous visit.  Shoulder-R MR wo contrast: No results found for this or any previous visit.  Shoulder-L MR wo contrast: No results found for this or any previous visit.  Shoulder-R CT w contrast: No results found for this or any previous visit.  Shoulder-L CT w contrast: No results found for this or any previous visit.  Shoulder-R CT w/wo contrast: No results found for this or any previous visit.  Shoulder-L CT w/wo contrast: No results found for this or any previous visit.  Shoulder-R CT wo contrast: No results found for this or any previous visit.  Shoulder-L CT wo contrast: No results found for this or any previous visit.  Shoulder-R DG Arthrogram: No results found for this or any previous visit.  Shoulder-L DG Arthrogram: No results found for this or any previous visit.  Shoulder-R DG 1 view: No results found for this or any previous visit.  Shoulder-L DG 1 view: No results found for this or any previous visit.  Shoulder-R DG: No results found for this or any previous visit.  Shoulder-L DG: No results found for this or any previous visit.   Thoracic Imaging: Thoracic MR wo contrast: No results found for this or any previous visit.  Thoracic MR wo  contrast: No valid procedures specified. Thoracic MR w/wo contrast: No results found for this or any previous visit.  Thoracic MR w contrast: No results found for this or any previous visit.  Thoracic CT wo contrast: No results found for this or any previous visit.  Thoracic CT w/wo contrast: No results found for this or any previous visit.  Thoracic CT w/wo contrast: No results found for this or any previous visit.  Thoracic CT w contrast: Results for orders placed during the hospital encounter of 01/05/20  CT THORACIC SPINE W CONTRAST  Narrative CLINICAL DATA:  Back pain, scoliosis, postop. History of breast carcinoma.  EXAM: CT MYELOGRAPHY THORACIC SPINE  CT MYELOGRAPHY LUMBAR SPINE  TECHNIQUE: CT imaging of the lumbar and thoracic spine was performed after intrathecal contrast administration by radiologist Dr. Golden Circle. Multiplanar CT image reconstructions were also generated.  COMPARISON:  09/13/2019 and previous  FINDINGS: Segmentation: There are 11 rib-bearing thoracic segments and 5 non-rib-bearing lumbar segments assigned L1-L5 as before.  Alignment: There is a mild thoracic dextroscoliosis apex T8, and a mild compensatory lumbar levoscoliosis apex L2. There is a mild focal kyphosis at T8-9.  Vertebrae: No fracture, suggestion of discitis, or focal bone lesion. Posterior spinal fixation hardware with bilateral pedicle screws T9-S1, with intact interconnecting hardware. Additional fixation hardware extends across bilateral sacroiliac joints. Anterior fixation hardware at L4-5 and L5-S1 with interbody graft at both levels, solid-appearing fusion. No significant surrounding lucency.  Conus medullaris: Extends to the L2 level and appears normal.  Paraspinal and other soft tissues: Aortic Atherosclerosis (ICD10-170.0). Linear metallic  fragment anterior to the L5 vertebral body just inferior to the aortic bifurcation, stable since 05/28/2018 otherwise  unremarkable.  Disc levels:  C6-7: Incompletely visualized, moderate narrowing with anterior endplate spurring.  C7-T1: Moderate narrowing with anterior endplate spurring. No spinal stenosis.  T1-2: Moderate narrowing of the interspace with small anterior posterior endplate spurs. No spinal stenosis or cord distortion.  T2-3: Shallow broad posterior disc bulge without stenosis or cord distortion or contact.  T3-4: Unremarkable  T4-5: Small posterior disc bulge with minimal anterior cord flattening. No spinal stenosis.  T5-6: Unremarkable  T6-7: Left posterolateral disc protrusion without cord distortion or spinal stenosis.  T7-8: Mild disc bulge without cord distortion or stenosis.  T8-9: Marked narrowing of the interspace with discogenic sclerosis in small Schmorl's nodes in the adjacent vertebral bodies. There is a mild wedge deformity of the T8 vertebral body which had developed since 10/09/2018, stable since 09/13/2019. There is a moderate broad disc protrusion which contacts but does not displace or distort the thoracic cord.  T9-10: Unremarkable post surgical fixation.  T10-11: Unremarkable post surgical fixation.  T11-T1: Unremarkable, post surgical fixation.  L1-2: Unremarkable, post surgical fixation.  L2-3: Unremarkable, solid-appearing surgical fixation.  L3-4: Solid-appearing posterior fusion  L4-5: Posterior decompression with solid-appearing instrumented anterior and posterior fusion.  L5-S1: Solid-appearing instrumented anterior posterior spinal fusion.  IMPRESSION: 1. Endplate spurring and disc bulges C6-T3 without compressive pathology. Solid-appearing instrumented fusion from T9 to the pelvis. 2. Small disc bulge T4-5 with minimal anterior cord flattening. 3. Left posterolateral disc protrusion T6-7. 4. Chronic mild T8 compression deformity, with advanced degenerative disc disease and broad posterior disc protrusion T8-9. 5. Posterior  instrumented spinal fusion from T9 to the pelvis, without apparent complication.   Electronically Signed By: Lucrezia Europe M.D. On: 01/05/2020 15:17  Thoracic DG 2-3 views: Results for orders placed in visit on 09/10/19  DG Thoracic Spine 2 View  Narrative CLINICAL DATA:  Mid back pain post surgery  EXAM: THORACIC SPINE 2 VIEWS  COMPARISON:  CTA chest 10/09/2018 multilevel discogenic and facet degenerative changes are present in thoracic spine. Upper thoracic levels  FINDINGS: Partially imaged T10-S1 long segment spinal fusion is visualized. No evidence of acute hardware fracture or complication in the included portions of the hardware. There are postsurgical changes from reverse right shoulder hemiarthroplasty with additional surgical clips in the right axilla. No acute fracture or vertebral body height loss is evident. Multilevel discogenic changes are present in the thoracic spine at the non instrumented levels. Mild adjacent segment disease at T9.  IMPRESSION: 1. Partially imaged T10-S1 long segment spinal fusion without evidence of hardware complication. 2. Mild adjacent segment disease at T9-10. 3. No acute fracture or traumatic malalignment. 4. Multilevel discogenic changes elsewhere throughout the thoracic spine.   Electronically Signed By: Lovena Le M.D. On: 09/10/2019 20:24  Thoracic DG 4 views: No results found for this or any previous visit.  Thoracic DG: No results found for this or any previous visit.  Thoracic DG w/swimmers view: No results found for this or any previous visit.  Thoracic DG Myelogram views: Results for orders placed during the hospital encounter of 01/05/20  DG Myelogram Thoracic  Narrative CLINICAL DATA:  Back pain  FLUOROSCOPY TIME:  2 minutes 6 seconds  PROCEDURE: LUMBAR PUNCTURE FOR THORACIC AND LUMBAR MYELOGRAM  After thorough discussion of risks and benefits of the procedure including bleeding, infection, injury to  nerves, blood vessels, adjacent structures as well as headache and CSF leak, written and oral  informed consent was obtained. Consent was obtained by Dr. Inez Catalina.  Patient was positioned prone on the fluoroscopy table. Local anesthesia was provided with 1% lidocaine without epinephrine after prepped and draped in the usual sterile fashion. Puncture was performed at L4 using a 3 1/2 inch 22-gauge spinal needle via paramedian approach. Using a single pass through the dura, the needle was placed within the thecal sac, with return of clear CSF. 13 mL of Omnipaque 300 was injected into the thecal sac, with normal opacification of the nerve roots and cauda equina consistent with free flow within the subarachnoid space. The patient was then moved to the trendelenburg position and contrast flowed into the Thoracic spine region.  I personally performed the lumbar puncture and administered the intrathecal contrast. I also personally supervised acquisition of the myelogram images.  FINDINGS: Free flow contrast material is noted into the lumbar thecal sac. No significant spinal stenosis is noted.  Contrast material flowed freely into the thoracic thecal sac. No central canal stenosis is noted.  Orthopedic hardware is noted extending from T10-S2. No hardware failure is noted.  IMPRESSION: No evidence of significant central canal stenosis. Nerve root impingement will be better assessed on upcoming CT.   Electronically Signed By: Inez Catalina M.D. On: 01/05/2020 10:17  Thoracic DG Myelogram views: No results found for this or any previous visit.   Lumbosacral Imaging: Lumbar MR wo contrast: No results found for this or any previous visit.  Lumbar MR wo contrast: No valid procedures specified. Lumbar MR w/wo contrast: No results found for this or any previous visit.  Lumbar MR w/wo contrast: No results found for this or any previous visit.  Lumbar MR w contrast: No results found  for this or any previous visit.  Lumbar CT wo contrast: Results for orders placed during the hospital encounter of 09/13/19  CT LUMBAR SPINE WO CONTRAST  Narrative CLINICAL DATA:  Worsening right low back pain radiating down the right leg. History of lumbar fusion and breast cancer.  EXAM: CT LUMBAR SPINE WITHOUT CONTRAST  TECHNIQUE: Multidetector CT imaging of the lumbar spine was performed without intravenous contrast administration. Multiplanar CT image reconstructions were also generated.  COMPARISON:  CT abdomen and pelvis 05/28/2018  FINDINGS: Segmentation: 5 lumbar type vertebrae.  Alignment: Mild lumbar dextroscoliosis crash that mild lumbar levoscoliosis. Trace retrolisthesis of T11 on T12. Trace anterolisthesis of L3 on L4, L4 on L5, and L5 on S1.  Vertebrae: Sequelae of posterior fusion are again identified extending from T10 to the sacrum with evidence of solid posterior osseous fusion at each level. Pedicle screws are present bilaterally from T10-S1 without evidence of loosening. Screws also extend across the SI joints bilaterally. There has also been prior anterior fusion at L4-5 and L5-S1 with anterior screws and interbody cages in place and evidence of solid arthrodesis at both levels. No acute fracture or suspicious osseous lesion is identified.  Paraspinal and other soft tissues: Partially visualized left-sided colonic diverticulosis. Mild aortic atherosclerosis.  Disc levels:  T9-10: New severe disc space narrowing with prominent degenerative endplate irregularity and sclerosis. No stenosis.  T10-11 through L5-S1: Previous posterior fusion with multilevel posterior decompression in the lumbar spine. Spinal canal intermittently obscured by streak artifact. No significant stenosis identified.  IMPRESSION: 1. Solid posterior fusion from T10 to the sacrum. No evidence of hardware complication. 2. New severe adjacent segment disc degeneration at T9-10  without evidence of stenosis. 3. Aortic Atherosclerosis (ICD10-I70.0).   Electronically Signed By: Seymour Bars.D.  On: 09/13/2019 14:10  Lumbar CT w/wo contrast: No results found for this or any previous visit.  Lumbar CT w/wo contrast: No results found for this or any previous visit.  Lumbar CT w contrast: Results for orders placed during the hospital encounter of 01/05/20  CT LUMBAR SPINE W CONTRAST  Narrative CLINICAL DATA:  Back pain, scoliosis, postop. History of breast carcinoma.  EXAM: CT MYELOGRAPHY THORACIC SPINE  CT MYELOGRAPHY LUMBAR SPINE  TECHNIQUE: CT imaging of the lumbar and thoracic spine was performed after intrathecal contrast administration by radiologist Dr. Golden Circle. Multiplanar CT image reconstructions were also generated.  COMPARISON:  09/13/2019 and previous  FINDINGS: Segmentation: There are 11 rib-bearing thoracic segments and 5 non-rib-bearing lumbar segments assigned L1-L5 as before.  Alignment: There is a mild thoracic dextroscoliosis apex T8, and a mild compensatory lumbar levoscoliosis apex L2. There is a mild focal kyphosis at T8-9.  Vertebrae: No fracture, suggestion of discitis, or focal bone lesion. Posterior spinal fixation hardware with bilateral pedicle screws T9-S1, with intact interconnecting hardware. Additional fixation hardware extends across bilateral sacroiliac joints. Anterior fixation hardware at L4-5 and L5-S1 with interbody graft at both levels, solid-appearing fusion. No significant surrounding lucency.  Conus medullaris: Extends to the L2 level and appears normal.  Paraspinal and other soft tissues: Aortic Atherosclerosis (ICD10-170.0). Linear metallic fragment anterior to the L5 vertebral body just inferior to the aortic bifurcation, stable since 05/28/2018 otherwise unremarkable.  Disc levels:  C6-7: Incompletely visualized, moderate narrowing with anterior endplate spurring.  C7-T1: Moderate narrowing  with anterior endplate spurring. No spinal stenosis.  T1-2: Moderate narrowing of the interspace with small anterior posterior endplate spurs. No spinal stenosis or cord distortion.  T2-3: Shallow broad posterior disc bulge without stenosis or cord distortion or contact.  T3-4: Unremarkable  T4-5: Small posterior disc bulge with minimal anterior cord flattening. No spinal stenosis.  T5-6: Unremarkable  T6-7: Left posterolateral disc protrusion without cord distortion or spinal stenosis.  T7-8: Mild disc bulge without cord distortion or stenosis.  T8-9: Marked narrowing of the interspace with discogenic sclerosis in small Schmorl's nodes in the adjacent vertebral bodies. There is a mild wedge deformity of the T8 vertebral body which had developed since 10/09/2018, stable since 09/13/2019. There is a moderate broad disc protrusion which contacts but does not displace or distort the thoracic cord.  T9-10: Unremarkable post surgical fixation.  T10-11: Unremarkable post surgical fixation.  T11-T1: Unremarkable, post surgical fixation.  L1-2: Unremarkable, post surgical fixation.  L2-3: Unremarkable, solid-appearing surgical fixation.  L3-4: Solid-appearing posterior fusion  L4-5: Posterior decompression with solid-appearing instrumented anterior and posterior fusion.  L5-S1: Solid-appearing instrumented anterior posterior spinal fusion.  IMPRESSION: 1. Endplate spurring and disc bulges C6-T3 without compressive pathology. Solid-appearing instrumented fusion from T9 to the pelvis. 2. Small disc bulge T4-5 with minimal anterior cord flattening. 3. Left posterolateral disc protrusion T6-7. 4. Chronic mild T8 compression deformity, with advanced degenerative disc disease and broad posterior disc protrusion T8-9. 5. Posterior instrumented spinal fusion from T9 to the pelvis, without apparent complication.   Electronically Signed By: Lucrezia Europe M.D. On: 01/05/2020  15:17  Lumbar DG 1V: No results found for this or any previous visit.  Lumbar DG 1V (Clearing): No results found for this or any previous visit.  Lumbar DG 2-3V (Clearing): No results found for this or any previous visit.  Lumbar DG 2-3 views: No results found for this or any previous visit.  Lumbar DG (Complete) 4+V: No results found for this or any  previous visit.        Lumbar DG F/E views: No results found for this or any previous visit.        Lumbar DG Bending views: No results found for this or any previous visit.        Lumbar DG Myelogram views: Results for orders placed during the hospital encounter of 01/05/20  DG Myelogram Lumbar  Narrative CLINICAL DATA:  Back pain  FLUOROSCOPY TIME:  2 minutes 6 seconds  PROCEDURE: LUMBAR PUNCTURE FOR THORACIC AND LUMBAR MYELOGRAM  After thorough discussion of risks and benefits of the procedure including bleeding, infection, injury to nerves, blood vessels, adjacent structures as well as headache and CSF leak, written and oral informed consent was obtained. Consent was obtained by Dr. Inez Catalina.  Patient was positioned prone on the fluoroscopy table. Local anesthesia was provided with 1% lidocaine without epinephrine after prepped and draped in the usual sterile fashion. Puncture was performed at L4 using a 3 1/2 inch 22-gauge spinal needle via paramedian approach. Using a single pass through the dura, the needle was placed within the thecal sac, with return of clear CSF. 13 mL of Omnipaque 300 was injected into the thecal sac, with normal opacification of the nerve roots and cauda equina consistent with free flow within the subarachnoid space. The patient was then moved to the trendelenburg position and contrast flowed into the Thoracic spine region.  I personally performed the lumbar puncture and administered the intrathecal contrast. I also personally supervised acquisition of the myelogram  images.  FINDINGS: Free flow contrast material is noted into the lumbar thecal sac. No significant spinal stenosis is noted.  Contrast material flowed freely into the thoracic thecal sac. No central canal stenosis is noted.  Orthopedic hardware is noted extending from T10-S2. No hardware failure is noted.  IMPRESSION: No evidence of significant central canal stenosis. Nerve root impingement will be better assessed on upcoming CT.   Electronically Signed By: Inez Catalina M.D. On: 01/05/2020 10:17  Lumbar DG Myelogram: No results found for this or any previous visit.  Lumbar DG Myelogram: No results found for this or any previous visit.  Lumbar DG Myelogram: No results found for this or any previous visit.  Lumbar DG Myelogram Lumbosacral: No results found for this or any previous visit.  Lumbar DG Diskogram views: No results found for this or any previous visit.  Lumbar DG Diskogram views: No results found for this or any previous visit.  Lumbar DG Epidurogram OP: No results found for this or any previous visit.  Lumbar DG Epidurogram IP: No results found for this or any previous visit.   Sacroiliac Joint Imaging: Sacroiliac Joint DG: No results found for this or any previous visit.  Sacroiliac Joint MR w/wo contrast: No results found for this or any previous visit.  Sacroiliac Joint MR wo contrast: No results found for this or any previous visit.   Spine Imaging: Whole Spine DG Myelogram views: No results found for this or any previous visit.  Whole Spine MR Mets screen: No results found for this or any previous visit.  Whole Spine MR Mets screen: No results found for this or any previous visit.  Whole Spine MR w/wo: No results found for this or any previous visit.  MRA Spinal Canal w/ cm: No results found for this or any previous visit.  MRA Spinal Canal wo/ cm: No valid procedures specified. MRA Spinal Canal w/wo cm: No results found for this or any  previous  visit.  Spine Outside MR Films: No results found for this or any previous visit.  Spine Outside CT Films: No results found for this or any previous visit.  CT-Guided Biopsy: No results found for this or any previous visit.  CT-Guided Needle Placement: No results found for this or any previous visit.  DG Spine outside: No results found for this or any previous visit.  IR Spine outside: No results found for this or any previous visit.  NM Spine outside: No results found for this or any previous visit.   Hip Imaging: Hip-R MR w contrast: No results found for this or any previous visit.  Hip-L MR w contrast: No results found for this or any previous visit.  Hip-R MR w/wo contrast: No results found for this or any previous visit.  Hip-L MR w/wo contrast: No results found for this or any previous visit.  Hip-R MR wo contrast: No results found for this or any previous visit.  Hip-L MR wo contrast: No results found for this or any previous visit.  Hip-R CT w contrast: No results found for this or any previous visit.  Hip-L CT w contrast: No results found for this or any previous visit.  Hip-R CT w/wo contrast: No results found for this or any previous visit.  Hip-L CT w/wo contrast: No results found for this or any previous visit.  Hip-R CT wo contrast: No results found for this or any previous visit.  Hip-L CT wo contrast: No results found for this or any previous visit.  Hip-R DG 2-3 views: Results for orders placed in visit on 09/10/19  DG Hip Unilat W OR W/O Pelvis 2-3 Views Right  Narrative CLINICAL DATA:  Right hip pain post surgery  EXAM: DG HIP (WITH OR WITHOUT PELVIS) 2-3V RIGHT  COMPARISON:  CT abdomen pelvis 05/28/2018  FINDINGS: Inferior extent of the patient's along segment thoracic to sacral fusion is seen. There is some periprosthetic lucency about the sacral fusion screws not clearly present on comparison CT of the abdomen and pelvis  05/28/2018. Degenerative changes are noted at the SI joints and both hips and at the symphysis pubis. No acute fracture or or traumatic malalignment of the hips or pelvis are seen. Soft tissues are unremarkable.  IMPRESSION: Inferior extent of the patient's thoracic to sacral fusion is seen. No hardware fracture is seen.  Question increasing periprosthetic lucency about the sacral fusion screws, not clearly present on comparison CT of the abdomen and pelvis 05/28/2018. could reflect early features of hardware loosening.  No acute fracture or traumatic malalignment.   Electronically Signed By: Lovena Le M.D. On: 09/10/2019 20:26  Hip-L DG 2-3 views: No results found for this or any previous visit.  Hip-R DG Arthrogram: No results found for this or any previous visit.  Hip-L DG Arthrogram: No results found for this or any previous visit.  Hip-B DG Bilateral: No results found for this or any previous visit.   Knee Imaging: Knee-R MR w contrast: No results found for this or any previous visit.  Knee-L MR w contrast: No results found for this or any previous visit.  Knee-R MR w/wo contrast: No results found for this or any previous visit.  Knee-L MR w/wo contrast: No results found for this or any previous visit.  Knee-R MR wo contrast: No results found for this or any previous visit.  Knee-L MR wo contrast: No results found for this or any previous visit.  Knee-R CT w contrast: No results  found for this or any previous visit.  Knee-L CT w contrast: No results found for this or any previous visit.  Knee-R CT w/wo contrast: No results found for this or any previous visit.  Knee-L CT w/wo contrast: No results found for this or any previous visit.  Knee-R CT wo contrast: No results found for this or any previous visit.  Knee-L CT wo contrast: No results found for this or any previous visit.  Knee-R DG 1-2 views: No results found for this or any previous  visit.  Knee-L DG 1-2 views: No results found for this or any previous visit.  Knee-R DG 3 views: No results found for this or any previous visit.  Knee-L DG 3 views: No results found for this or any previous visit.  Knee-R DG 4 views: No results found for this or any previous visit.  Knee-L DG 4 views: No results found for this or any previous visit.  Knee-R DG Arthrogram: No results found for this or any previous visit.  Knee-L DG Arthrogram: No results found for this or any previous visit.   Ankle Imaging: Ankle-R DG Complete: No results found for this or any previous visit.  Ankle-L DG Complete: No results found for this or any previous visit.   Foot Imaging: Foot-R DG Complete: No results found for this or any previous visit.  Foot-L DG Complete: No results found for this or any previous visit.   Elbow Imaging: Elbow-R DG Complete: No results found for this or any previous visit.  Elbow-L DG Complete: No results found for this or any previous visit.   Wrist Imaging: Wrist-R DG Complete: No results found for this or any previous visit.  Wrist-L DG Complete: No results found for this or any previous visit.   Hand Imaging: Hand-R DG Complete: No results found for this or any previous visit.  Hand-L DG Complete: No results found for this or any previous visit.   Complexity Note: Imaging results reviewed. Results shared with Maria Spencer, using Layman's terms.                         ROS  Cardiovascular: Daily Aspirin intake and High blood pressure Pulmonary or Respiratory: No reported pulmonary signs or symptoms such as wheezing and difficulty taking a deep full breath (Asthma), difficulty blowing air out (Emphysema), coughing up mucus (Bronchitis), persistent dry cough, or temporary stoppage of breathing during sleep Neurological: Curved spine Psychological-Psychiatric: No reported psychological or psychiatric signs or symptoms such as difficulty sleeping,  anxiety, depression, delusions or hallucinations (schizophrenial), mood swings (bipolar disorders) or suicidal ideations or attempts Gastrointestinal: Reflux or heatburn and Alternating episodes iof diarrhea and constipation (IBS-Irritable bowe syndrome) Genitourinary: Recurrent Urinary Tract infections Hematological: No reported hematological signs or symptoms such as prolonged bleeding, low or poor functioning platelets, bruising or bleeding easily, hereditary bleeding problems, low energy levels due to low hemoglobin or being anemic Endocrine: Slow thyroid Rheumatologic: Joint aches and or swelling due to excess weight (Osteoarthritis) Musculoskeletal: Negative for myasthenia gravis, muscular dystrophy, multiple sclerosis or malignant hyperthermia Work History: Retired  Allergies  Maria Spencer is allergic to sulfa antibiotics, nitrofuran derivatives, amlodipine besylate, and nitrofurantoin.  Laboratory Chemistry Profile   Renal Lab Results  Component Value Date   BUN 24 (H) 05/08/2020   CREATININE 0.54 05/08/2020   BCR 28 (H) 09/10/2019   GFR 63.81 09/22/2018   GFRAA >60 05/08/2020   GFRNONAA >60 05/08/2020   SPECGRAV 1.025 03/27/2020  PHUR 7.0 03/27/2020   PROTEINUR Negative 03/27/2020     Electrolytes Lab Results  Component Value Date   NA 138 05/08/2020   K 4.0 05/08/2020   CL 102 05/08/2020   CALCIUM 9.5 05/08/2020   MG 2.0 05/08/2020     Hepatic Lab Results  Component Value Date   AST 19 05/08/2020   ALT 15 05/08/2020   ALBUMIN 4.4 05/08/2020   ALKPHOS 97 05/08/2020   LIPASE 23 05/28/2018     ID Lab Results  Component Value Date   STAPHAUREUS NEGATIVE 05/28/2018   MRSAPCR NEGATIVE 05/28/2018     Bone Lab Results  Component Value Date   VD25OH 43.23 09/15/2018     Endocrine Lab Results  Component Value Date   GLUCOSE 102 (H) 05/08/2020   GLUCOSEU Negative 03/27/2020   HGBA1C 5.6 09/10/2019   TSH 2.63 09/10/2019     Neuropathy Lab Results   Component Value Date   VITAMINB12 261 01/07/2018   HGBA1C 5.6 09/10/2019     CNS No results found for: COLORCSF, APPEARCSF, RBCCOUNTCSF, WBCCSF, POLYSCSF, LYMPHSCSF, EOSCSF, PROTEINCSF, GLUCCSF, JCVIRUS, CSFOLI, IGGCSF, LABACHR, ACETBL, LABACHR, ACETBL   Inflammation (CRP: Acute  ESR: Chronic) Lab Results  Component Value Date   ESRSEDRATE 5 05/08/2020     Rheumatology No results found for: RF, ANA, LABURIC, URICUR, LYMEIGGIGMAB, LYMEABIGMQN, HLAB27   Coagulation Lab Results  Component Value Date   INR 0.9 01/05/2020   LABPROT 11.9 01/05/2020   APTT 27 01/05/2020   PLT 211 01/05/2020     Cardiovascular Lab Results  Component Value Date   TROPONINI <0.03 10/10/2018   HGB 13.7 01/05/2020   HCT 43.1 01/05/2020     Screening Lab Results  Component Value Date   STAPHAUREUS NEGATIVE 05/28/2018   MRSAPCR NEGATIVE 05/28/2018     Cancer No results found for: CEA, CA125, LABCA2   Allergens No results found for: ALMOND, APPLE, ASPARAGUS, AVOCADO, BANANA, BARLEY, BASIL, BAYLEAF, GREENBEAN, LIMABEAN, WHITEBEAN, BEEFIGE, REDBEET, BLUEBERRY, BROCCOLI, CABBAGE, MELON, CARROT, CASEIN, CASHEWNUT, CAULIFLOWER, CELERY     Note: Lab results reviewed.   PFSH  Drug: Maria Spencer  reports no history of drug use. Alcohol:  reports previous alcohol use of about 7.0 standard drinks of alcohol per week. Tobacco:  reports that she has never smoked. She has never used smokeless tobacco. Medical:  has a past medical history of Arthritis, Breast cancer ([place]) (2009), Chicken pox, Chronic UTI, Diverticular disease, Esophagitis, Hormone disorder, Hyperlipidemia, Hypertension, Personal history of radiation therapy (2009), and Thyroid disease. Family: family history includes AAA (abdominal aortic aneurysm) in her father; Arthritis in her father, mother, sister, and sister; Cancer in her brother and father; Coronary artery disease (age of onset: 71) in her mother; Diabetes in her father; Drug abuse in  her daughter; Heart disease (age of onset: 76) in her mother; Hyperlipidemia in her mother, sister, and sister; Hypertension in her mother and sister; Mental retardation in her brother.  Past Surgical History:  Procedure Laterality Date  . BONE GRAFT HIP ILIAC CREST     + cage left hip 10/23/17   . BREAST BIOPSY Left 2009   clip,benign  . BREAST BIOPSY Right 2009   +  . BREAST LUMPECTOMY Right 2009   2009 lumpectomy   . CARDIAC CATHETERIZATION     No stents; [facility] doesn't recall facility +78yr ago  . EYE SURGERY     [place] [facility] . INSERTION OF MESH N/A 05/29/2018   Procedure: INSERTION  OF MESH;  Surgeon: Johnathan Hausen, MD;  Location: [facility];  Service: General;  Laterality: N/A;  . SHOULDER SURGERY     x2 surgeries both shoulders, right shoulder replacement last in 2012   . SPINE SURGERY  10/23/2017   spinal 09/2017 h/o scoliosis [facility] L4-S1 OLIF T10 to ililum fusion  . TONSILLECTOMY     age 37 y.o.   . TOTAL SHOULDER REPLACEMENT    . VENTRAL HERNIA REPAIR N/A 05/29/2018   Procedure: LAPAROSCOPIC VENTRAL / [place];  Surgeon: Johnathan Hausen, MD;  Location: [facility];  Service: General;  Laterality: N/A;   Active Ambulatory Problems    Diagnosis Date Noted  . IBS (irritable bowel syndrome) 12/29/2017  . History of breast cancer 12/29/2017  . Hypothyroidism 02/15/2013  . Chronic UTI 12/31/2017  . Baker's cyst of knee, left 12/29/2017  . Hyperlipidemia 02/15/2013  . Abdominal pain 12/29/2017  . Kidney stones 03/29/2015  . Scoliosis of cervical region due to degenerative disease of spine in adult 09/18/2017  . Arthritis 01/07/2018  . Unilateral inguinal hernia without obstruction or gangrene 03/26/2018  . Abdominal hernia without obstruction and without gangrene 03/26/2018  . 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 Scoliosis of thoracolumbar spine s/p T10-ilium fusion 10/20/2017 05/28/2018  . Chronic  hip pain (2ry area of Pain) (Bilateral) (R>L) 09/13/2019  . Mid back pain 09/13/2019  . Aortic atherosclerosis ([place]) 09/17/2019  . Osteoporosis 01/14/2020  . Chronic low back pain with sciatica 01/14/2020  . Chronic thoracic back pain (1ry area of Pain) (Bilateral) (R>L) 01/14/2020  . Age-related nuclear cataract of right eye 08/19/2019  . Fusion of spine of lumbosacral region 12/13/2019  . Lumbosacral spondylosis with radiculopathy 12/13/2019  . S/P LASIK (laser assisted in situ keratomileusis) of both eyes 11/01/2019  . Age-related nuclear cataract of left eye 08/19/2019  . Chronic pain syndrome 05/08/2020  . Pharmacologic therapy 05/08/2020  . Disorder of skeletal system 05/08/2020  . Problems influencing health status 05/08/2020  . Elevated blood pressure reading with diagnosis of hypertension 02/15/2013  . Allergic rhinitis 02/15/2013  . Essential hypertension 02/15/2013  . Chronic lower extremity pain (3ry area of Pain) (Right) 05/08/2020  . Chronic knee pain (4th area of Pain) (Posterior) (Left) 05/08/2020  . Closed wedge compression fracture of T8 vertebra ([place]) 05/08/2020  . Thoracic facet syndrome 05/08/2020  . Chronic sacroiliac joint pain (Bilateral) 05/08/2020   Resolved Ambulatory Problems    Diagnosis Date Noted  . Incarcerated incisional hernia 05/28/2018   Past Medical History:  Diagnosis Date  . Breast cancer ([place]) 2009  . Chicken pox   . Diverticular disease   . Esophagitis   . Hormone disorder   . Hypertension   . Personal history of radiation therapy 2009  . Thyroid disease    Constitutional Exam  General appearance: Well nourished, well developed, and well hydrated. In no apparent acute distress Vitals:   05/08/20 0952  BP: (!) 147/71  Pulse: 87  Resp: 16  Temp: 97.9 F (36.6 C)  SpO2: 98%  Weight: 125 lb (56.7 kg)  Height: '4\' 11"'  (1.499 m)   BMI Assessment: Estimated body mass index is 25.25 kg/m as calculated from the following:   Height  as of this encounter: '4\' 11"'  (1.499 m).   Weight as of this encounter: 125 lb (56.7 kg).  BMI interpretation table: BMI level Category Range association with higher incidence of chronic pain  <18 kg/m2 Underweight   18.5-24.9 kg/m2 Ideal  body weight   25-29.9 kg/m2 Overweight Increased incidence by 20%  30-34.9 kg/m2 Obese (Class I) Increased incidence by 68%  35-39.9 kg/m2 Severe obesity (Class II) Increased incidence by 136%  >40 kg/m2 Extreme obesity (Class III) Increased incidence by 254%   Patient's current BMI Ideal Body weight  Body mass index is 25.25 kg/m. Patient must be at least 60 in tall to calculate ideal body weight   BMI Readings from Last 4 Encounters:  05/08/20 25.25 kg/m  03/27/20 26.66 kg/m  02/22/20 26.70 kg/m  02/07/20 26.46 kg/m   Wt Readings from Last 4 Encounters:  05/08/20 125 lb (56.7 kg)  03/27/20 132 lb (59.9 kg)  02/22/20 132 lb 3.2 oz (60 kg)  02/07/20 131 lb (59.4 kg)    Psych/Mental status: Alert, oriented x 3 (person, place, & time)       Eyes: PERLA Respiratory: No evidence of acute respiratory distress  Cervical Spine Exam  Skin & Axial Inspection: No masses, redness, edema, swelling, or associated skin lesions Alignment: Symmetrical Functional ROM: Unrestricted ROM      Stability: No instability detected Muscle Tone/Strength: Functionally intact. No obvious neuro-muscular anomalies detected. Sensory (Neurological): Unimpaired Palpation: No palpable anomalies              Upper Extremity (UE) Exam    Side: Right upper extremity  Side: Left upper extremity  Skin & Extremity Inspection: Skin color, temperature, and hair growth are WNL. No peripheral edema or cyanosis. No masses, redness, swelling, asymmetry, or associated skin lesions. No contractures.  Skin & Extremity Inspection: Skin color, temperature, and hair growth are WNL. No peripheral edema or cyanosis. No masses, redness, swelling, asymmetry, or associated skin lesions. No  contractures.  Functional ROM: Unrestricted ROM          Functional ROM: Unrestricted ROM          Muscle Tone/Strength: Functionally intact. No obvious neuro-muscular anomalies detected.  Muscle Tone/Strength: Functionally intact. No obvious neuro-muscular anomalies detected.  Sensory (Neurological): Unimpaired          Sensory (Neurological): Unimpaired          Palpation: No palpable anomalies              Palpation: No palpable anomalies              Provocative Test(s):  Phalen's test: deferred Tinel's test: deferred Apley's scratch test (touch opposite shoulder):  Action 1 (Across chest): deferred Action 2 (Overhead): deferred Action 3 (LB reach): deferred   Provocative Test(s):  Phalen's test: deferred Tinel's test: deferred Apley's scratch test (touch opposite shoulder):  Action 1 (Across chest): deferred Action 2 (Overhead): deferred Action 3 (LB reach): deferred    Thoracic Spine Area Exam  Skin & Axial Inspection: No masses, redness, or swelling Alignment: Symmetrical Functional ROM: Unrestricted ROM Stability: No instability detected Muscle Tone/Strength: Functionally intact. No obvious neuro-muscular anomalies detected. Sensory (Neurological): Unimpaired Muscle strength & Tone: No palpable anomalies  Lumbar Exam  Skin & Axial Inspection: No masses, redness, or swelling Alignment: Symmetrical Functional ROM: Unrestricted ROM       Stability: No instability detected Muscle Tone/Strength: Functionally intact. No obvious neuro-muscular anomalies detected. Sensory (Neurological): Unimpaired Palpation: No palpable anomalies       Provocative Tests: Hyperextension/rotation test: deferred today       Lumbar quadrant test (Kemp's test): deferred today       Lateral bending test: deferred today       Patrick's Maneuver: deferred today  FABER* test: deferred today                   S-I anterior distraction/compression test: deferred today         S-I  lateral compression test: deferred today         S-I Thigh-thrust test: deferred today         S-I Gaenslen's test: deferred today         *(Flexion, ABduction and External Rotation)  Gait & Posture Assessment  Ambulation: Unassisted Gait: Relatively normal for age and body habitus Posture: WNL   Lower Extremity Exam    Side: Right lower extremity  Side: Left lower extremity  Stability: No instability observed          Stability: No instability observed          Skin & Extremity Inspection: Skin color, temperature, and hair growth are WNL. No peripheral edema or cyanosis. No masses, redness, swelling, asymmetry, or associated skin lesions. No contractures.  Skin & Extremity Inspection: Skin color, temperature, and hair growth are WNL. No peripheral edema or cyanosis. No masses, redness, swelling, asymmetry, or associated skin lesions. No contractures.  Functional ROM: Unrestricted ROM                  Functional ROM: Unrestricted ROM                  Muscle Tone/Strength: Functionally intact. No obvious neuro-muscular anomalies detected.  Muscle Tone/Strength: Functionally intact. No obvious neuro-muscular anomalies detected.  Sensory (Neurological): Unimpaired        Sensory (Neurological): Unimpaired        DTR: Patellar: deferred today Achilles: deferred today Plantar: deferred today  DTR: Patellar: deferred today Achilles: deferred today Plantar: deferred today  Palpation: No palpable anomalies  Palpation: No palpable anomalies   Assessment  Primary Diagnosis & Pertinent Problem List: The primary encounter diagnosis was Chronic pain syndrome. Diagnoses of Pharmacologic therapy, Disorder of skeletal system, Problems influencing health status, Chronic thoracic back pain (1ry area of Pain) (Bilateral) (R>L), Chronic hip pain (2ry area of Pain) (Bilateral) (R>L), Chronic lower extremity pain (3ry area of Pain) (Right), Chronic knee pain (4th area of Pain) (Posterior) (Left), Closed  wedge compression fracture of T8 vertebra, sequela, Thoracic facet syndrome, and Chronic sacroiliac joint pain (Bilateral) were also pertinent to this visit.  Visit Diagnosis (New problems to examiner): 1. Chronic pain syndrome   2. Pharmacologic therapy   3. Disorder of skeletal system   4. Problems influencing health status   5. Chronic thoracic back pain (1ry area of Pain) (Bilateral) (R>L)   6. Chronic hip pain (2ry area of Pain) (Bilateral) (R>L)   7. Chronic lower extremity pain (3ry area of Pain) (Right)   8. Chronic knee pain (4th area of Pain) (Posterior) (Left)   9. Closed wedge compression fracture of T8 vertebra, sequela   10. Thoracic facet syndrome   11. Chronic sacroiliac joint pain (Bilateral)    Plan of Care (Initial workup plan)  Note: Maria Spencer was reminded that as per protocol, today's visit has been an evaluation only. We have not taken over the patient's controlled substance management.  Problem-specific plan: No problem-specific Assessment & Plan notes found for this encounter.   Lab Orders     Comp. Metabolic Panel (12)     Magnesium     Vitamin B12     Sedimentation rate     25-Hydroxy vitamin D Lcms D2+D3  C-reactive protein  Imaging Orders     DG HIP UNILAT W OR W/O PELVIS 2-3 VIEWS RIGHT     DG HIP UNILAT W OR W/O PELVIS 2-3 VIEWS LEFT     DG Si Joints Referral Orders  No referral(s) requested today    Procedure Orders     THORACIC FACET BLOCK Pharmacotherapy (current): Medications ordered:  No orders of the defined types were placed in this encounter.  Medications administered during this visit: Maria Dross "Maria Spencer" had no medications administered during this visit.   Pharmacological management options:  Opioid Analgesics: The patient was informed that there is no guarantee that she would be a candidate for opioid analgesics. The decision will be made following CDC guidelines. This decision will be based on the results of diagnostic  studies, as well as Ms. Schoeneck's risk profile.   Membrane stabilizer: To be determined at a later time  Muscle relaxant: To be determined at a later time  NSAID: To be determined at a later time  Other analgesic(s): To be determined at a later time   Interventional management options: Maria Spencer was informed that there is no guarantee that she would be a candidate for interventional therapies. The decision will be based on the results of diagnostic studies, as well as Maria Spencer's risk profile.  Procedure(s) under consideration:  Diagnostic bilateral thoracic facet block #1 (levels to be determined based on the patient's pain pattern and x-rays delineating the upper and lower borders of the pain) Further consideration should be given at a possible kyphoplasty of T8    Provider-requested follow-up: Return for Procedure (w/ sedation): (B) T-FCT BLK #1.  Future Appointments  Date Time Provider [place]  05/11/2020  9:15 AM Milinda Pointer, MD [facility] None  06/05/2020  9:20 AM [facility]  06/06/2020  2:15 PM Lin Landsman, MD [facility] None  07/11/2020  9:30 AM McLean-Scocuzza, Nino Glow, MD [facility]  08/07/2020  9:00 AM Bjorn Loser, MD [facility] None  09/12/2020  9:30 AM O'Brien-Blaney, Bryson Corona, LPN [facility]    Note by: Gaspar Cola, MD Date: 05/08/2020; Time: 3:42 PM

## 2020-05-08 ENCOUNTER — Other Ambulatory Visit: Payer: Self-pay

## 2020-05-08 ENCOUNTER — Ambulatory Visit (HOSPITAL_BASED_OUTPATIENT_CLINIC_OR_DEPARTMENT_OTHER): Payer: Medicare Other | Admitting: Pain Medicine

## 2020-05-08 ENCOUNTER — Ambulatory Visit
Admission: RE | Admit: 2020-05-08 | Discharge: 2020-05-08 | Disposition: A | Discharge status: Home / Self Care | Payer: Medicare Other | Source: Ambulatory Visit | Attending: Pain Medicine | Admitting: Pain Medicine

## 2020-05-08 ENCOUNTER — Other Ambulatory Visit
Admission: RE | Admit: 2020-05-08 | Discharge: 2020-05-08 | Disposition: A | Discharge status: Home / Self Care | Payer: Medicare Other | Source: Home / Self Care | Attending: Pain Medicine | Admitting: Pain Medicine

## 2020-05-08 ENCOUNTER — Ambulatory Visit
Admission: RE | Admit: 2020-05-08 | Discharge: 2020-05-08 | Disposition: A | Discharge status: Home / Self Care | Payer: Medicare Other | Attending: Pain Medicine | Admitting: Pain Medicine

## 2020-05-08 ENCOUNTER — Encounter: Payer: Self-pay | Admitting: Pain Medicine

## 2020-05-08 VITALS — BP 147/71 | HR 87 | Temp 97.9°F | Resp 16 | Ht 59.0 in | Wt 125.0 lb

## 2020-05-08 DIAGNOSIS — M899 Disorder of bone, unspecified: Secondary | ICD-10-CM | POA: Diagnosis not present

## 2020-05-08 DIAGNOSIS — M25551 Pain in right hip: Secondary | ICD-10-CM | POA: Insufficient documentation

## 2020-05-08 DIAGNOSIS — M546 Pain in thoracic spine: Secondary | ICD-10-CM

## 2020-05-08 DIAGNOSIS — Z79899 Other long term (current) drug therapy: Secondary | ICD-10-CM | POA: Diagnosis not present

## 2020-05-08 DIAGNOSIS — M25552 Pain in left hip: Secondary | ICD-10-CM | POA: Insufficient documentation

## 2020-05-08 DIAGNOSIS — G894 Chronic pain syndrome: Secondary | ICD-10-CM | POA: Insufficient documentation

## 2020-05-08 DIAGNOSIS — M160 Bilateral primary osteoarthritis of hip: Secondary | ICD-10-CM | POA: Diagnosis not present

## 2020-05-08 DIAGNOSIS — M25562 Pain in left knee: Secondary | ICD-10-CM

## 2020-05-08 DIAGNOSIS — G8929 Other chronic pain: Secondary | ICD-10-CM | POA: Insufficient documentation

## 2020-05-08 DIAGNOSIS — M79604 Pain in right leg: Secondary | ICD-10-CM

## 2020-05-08 DIAGNOSIS — M533 Sacrococcygeal disorders, not elsewhere classified: Secondary | ICD-10-CM | POA: Diagnosis not present

## 2020-05-08 DIAGNOSIS — M47894 Other spondylosis, thoracic region: Secondary | ICD-10-CM | POA: Insufficient documentation

## 2020-05-08 DIAGNOSIS — Z1211 Encounter for screening for malignant neoplasm of colon: Secondary | ICD-10-CM | POA: Insufficient documentation

## 2020-05-08 DIAGNOSIS — Z789 Other specified health status: Secondary | ICD-10-CM | POA: Insufficient documentation

## 2020-05-08 DIAGNOSIS — S22060S Wedge compression fracture of T7-T8 vertebra, sequela: Secondary | ICD-10-CM | POA: Diagnosis not present

## 2020-05-08 LAB — COMPREHENSIVE METABOLIC PANEL
ALT: 15 U/L (ref 0–44)
AST: 19 U/L (ref 15–41)
Albumin: 4.4 g/dL (ref 3.5–5.0)
Alkaline Phosphatase: 97 U/L (ref 38–126)
Anion gap: 10 (ref 5–15)
BUN: 24 mg/dL — ABNORMAL HIGH (ref 8–23)
CO2: 26 mmol/L (ref 22–32)
Calcium: 9.5 mg/dL (ref 8.9–10.3)
Chloride: 102 mmol/L (ref 98–111)
Creatinine, Ser: 0.54 mg/dL (ref 0.44–1.00)
GFR calc Af Amer: >60 mL/min (ref >60)
GFR calc non Af Amer: >60 mL/min (ref >60)
Glucose, Bld: 102 mg/dL — ABNORMAL HIGH (ref 70–99)
Potassium: 4 mmol/L (ref 3.5–5.1)
Sodium: 138 mmol/L (ref 135–145)
Total Bilirubin: 0.9 mg/dL (ref 0.3–1.2)
Total Protein: 7.2 g/dL (ref 6.5–8.1)

## 2020-05-08 LAB — C-REACTIVE PROTEIN: CRP: 0.6 mg/dL (ref <1.0)

## 2020-05-08 LAB — SEDIMENTATION RATE: Sed Rate: 5 mm/hr (ref 0–30)

## 2020-05-08 LAB — VITAMIN D 25 HYDROXY (VIT D DEFICIENCY, FRACTURES): Vit D, 25-Hydroxy: 19.57 ng/mL — ABNORMAL LOW (ref 30–100)

## 2020-05-08 LAB — MAGNESIUM: Magnesium: 2 mg/dL (ref 1.7–2.4)

## 2020-05-08 LAB — VITAMIN B12: Vitamin B-12: 278 pg/mL (ref 180–914)

## 2020-05-08 IMAGING — CR DG HIP (WITH OR WITHOUT PELVIS) 2-3V*R*
3 series · 3 of 3 positions shown · non-contrast
Comparison: None.

CLINICAL DATA: Chronic bilateral hip pain

EXAM:
DG HIP (WITH OR WITHOUT PELVIS) 2-3V RIGHT

[pelvis ap]
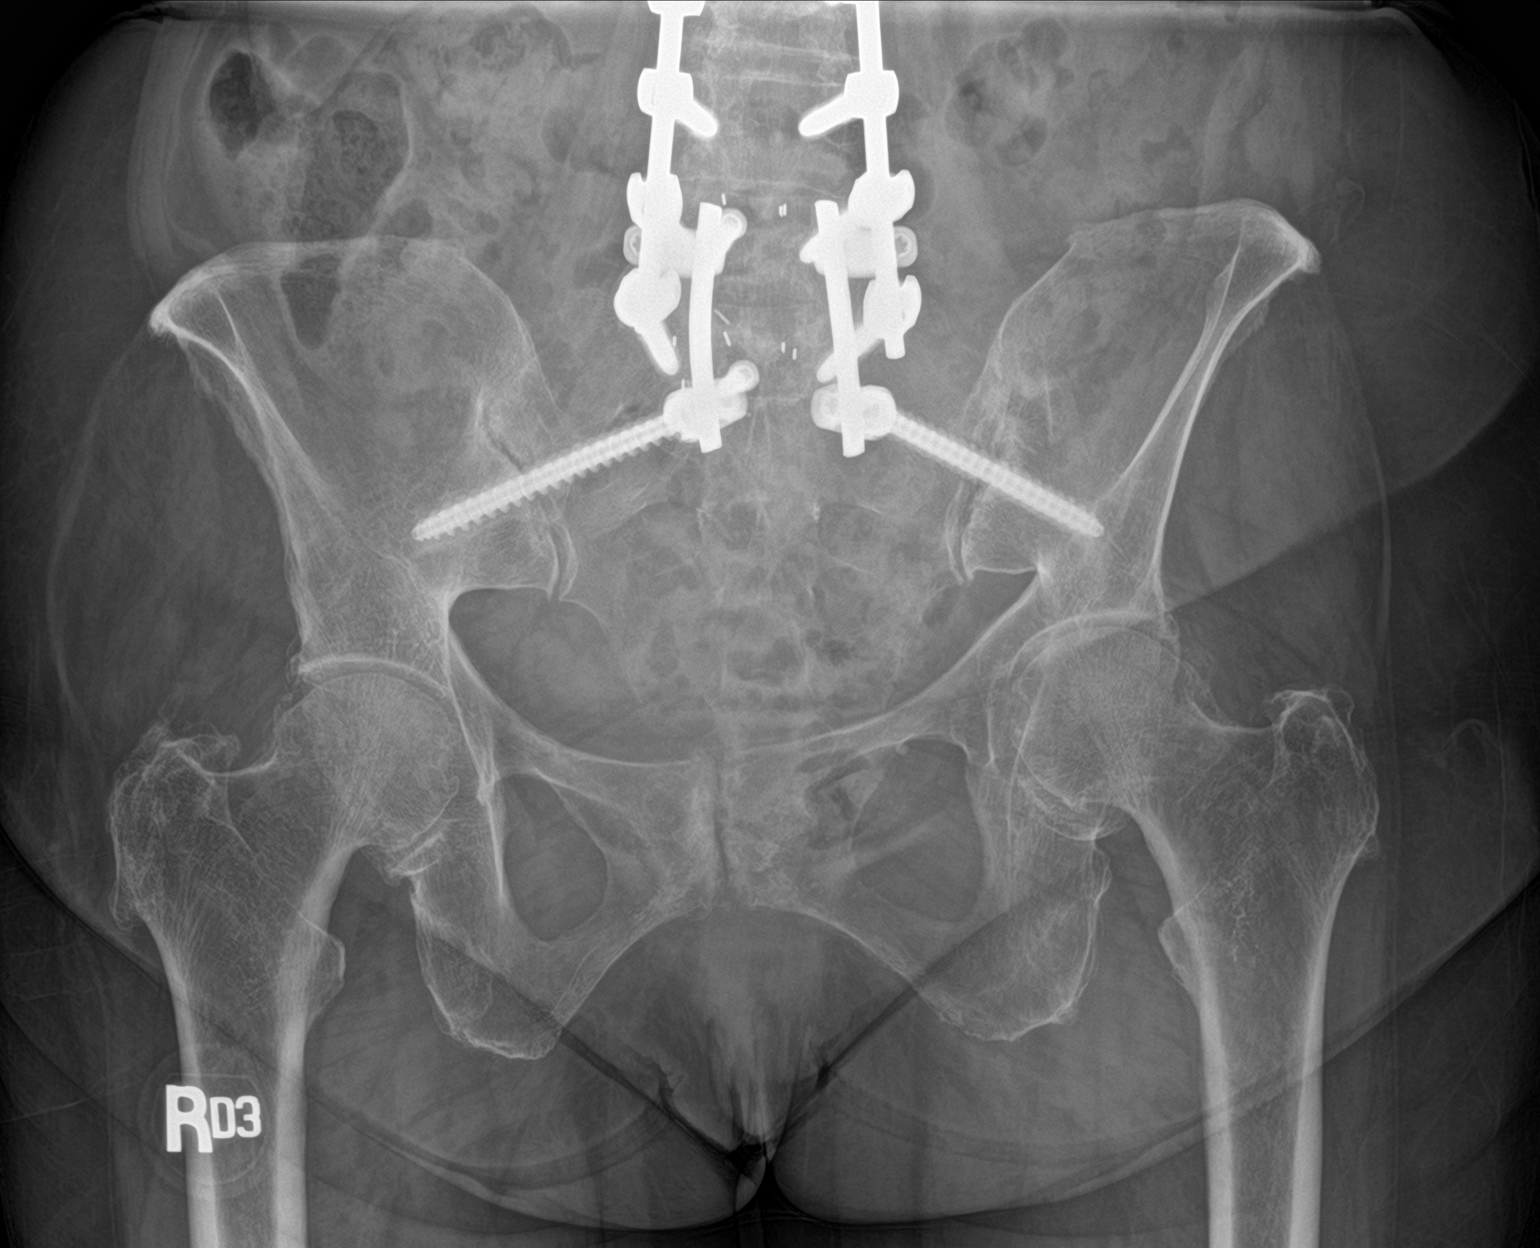

[hip ap]
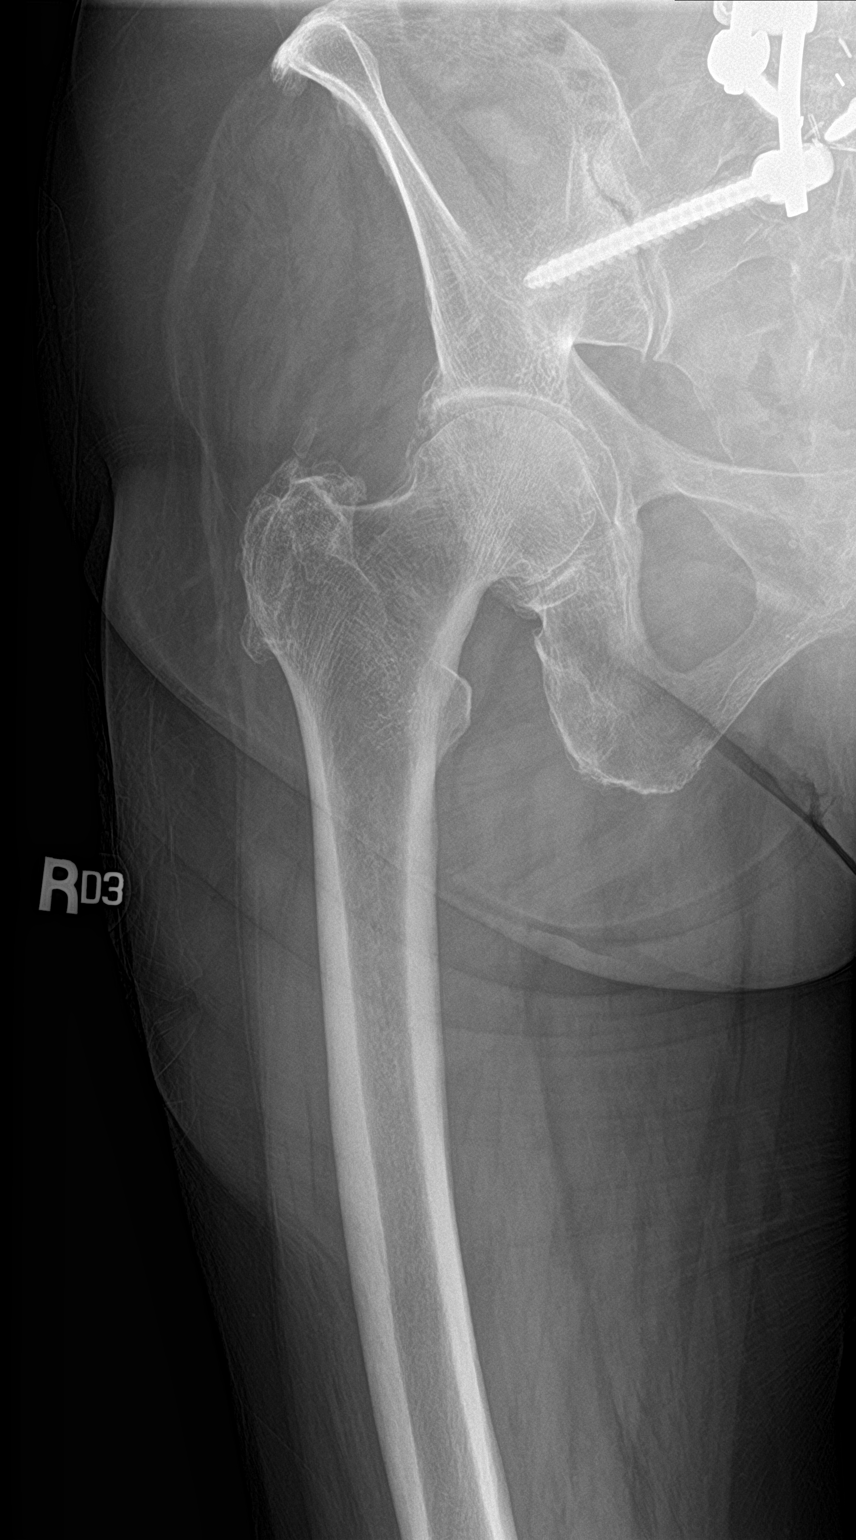

[hip lat]
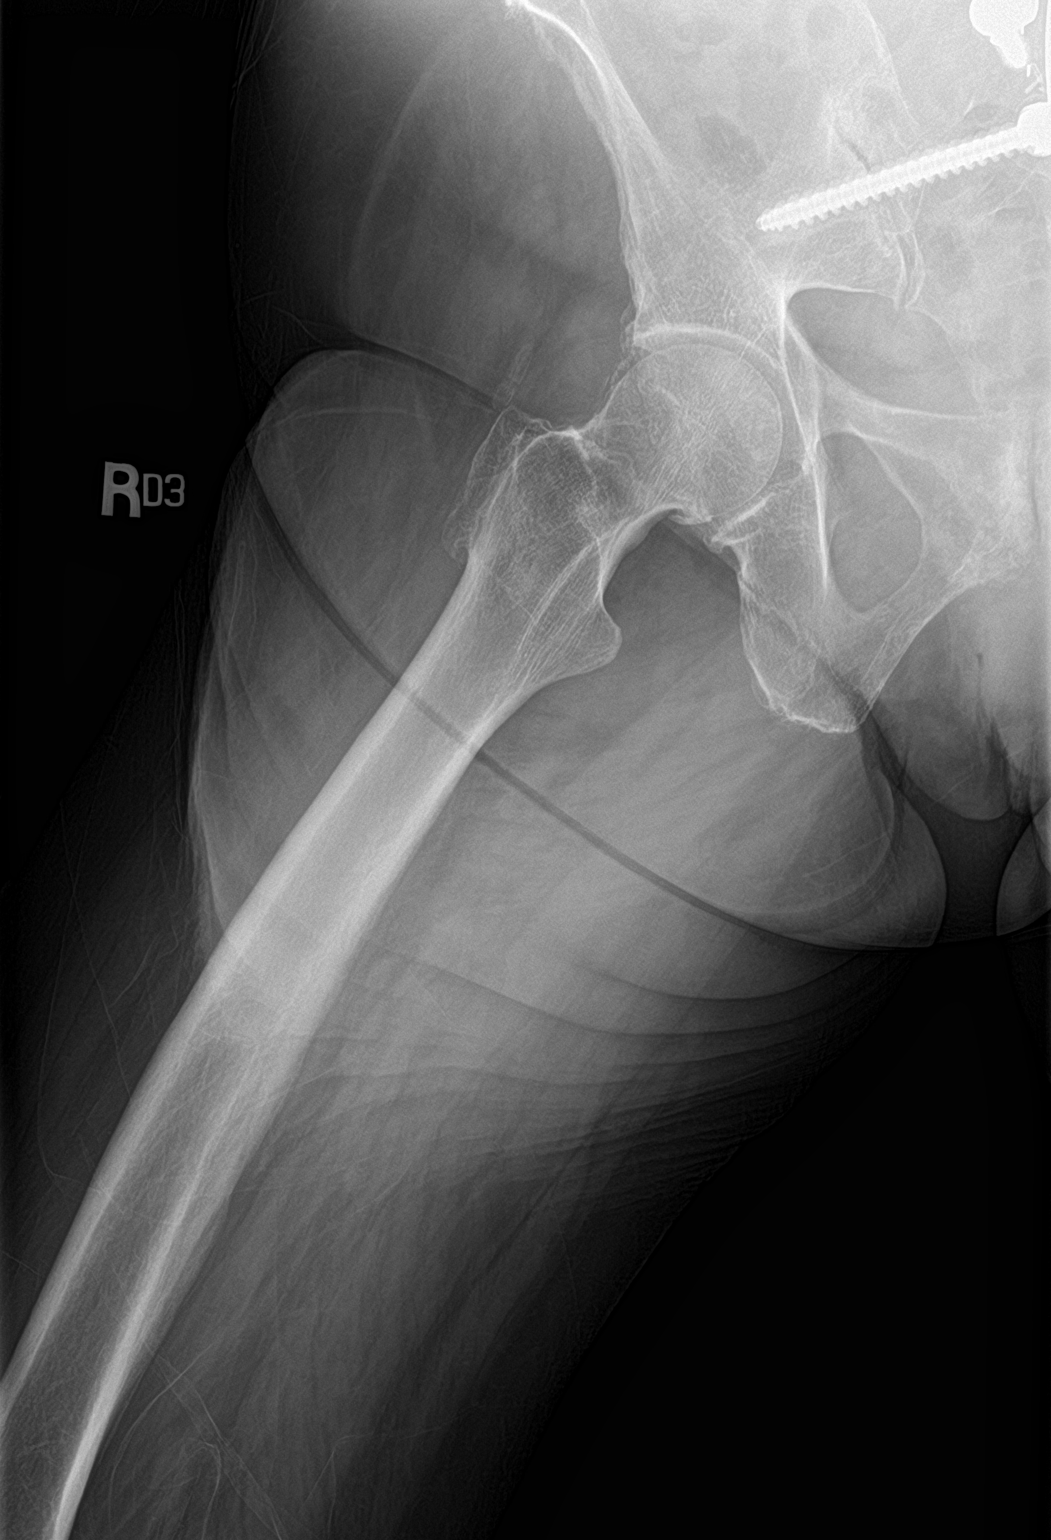

[3 of 3 positions shown; findings below may reference images not displayed]

FINDINGS: Postoperative changes in the lower lumbar spine. Bilateral SI screws
noted. Mild symmetric degenerative changes in the hips with joint
space narrowing and spurring. No acute bony abnormality.
Specifically, no fracture, subluxation, or dislocation.
IMPRESSION: Mild symmetric degenerative changes in the hips. No acute bony
abnormality.

## 2020-05-08 IMAGING — CR DG HIP (WITH OR WITHOUT PELVIS) 2-3V*L*
3 series · 3 of 3 positions shown · non-contrast
Comparison: Right hip performed today.

CLINICAL DATA: Right hip pain

EXAM:
DG HIP (WITH OR WITHOUT PELVIS) 2-3V LEFT

[hip ap]
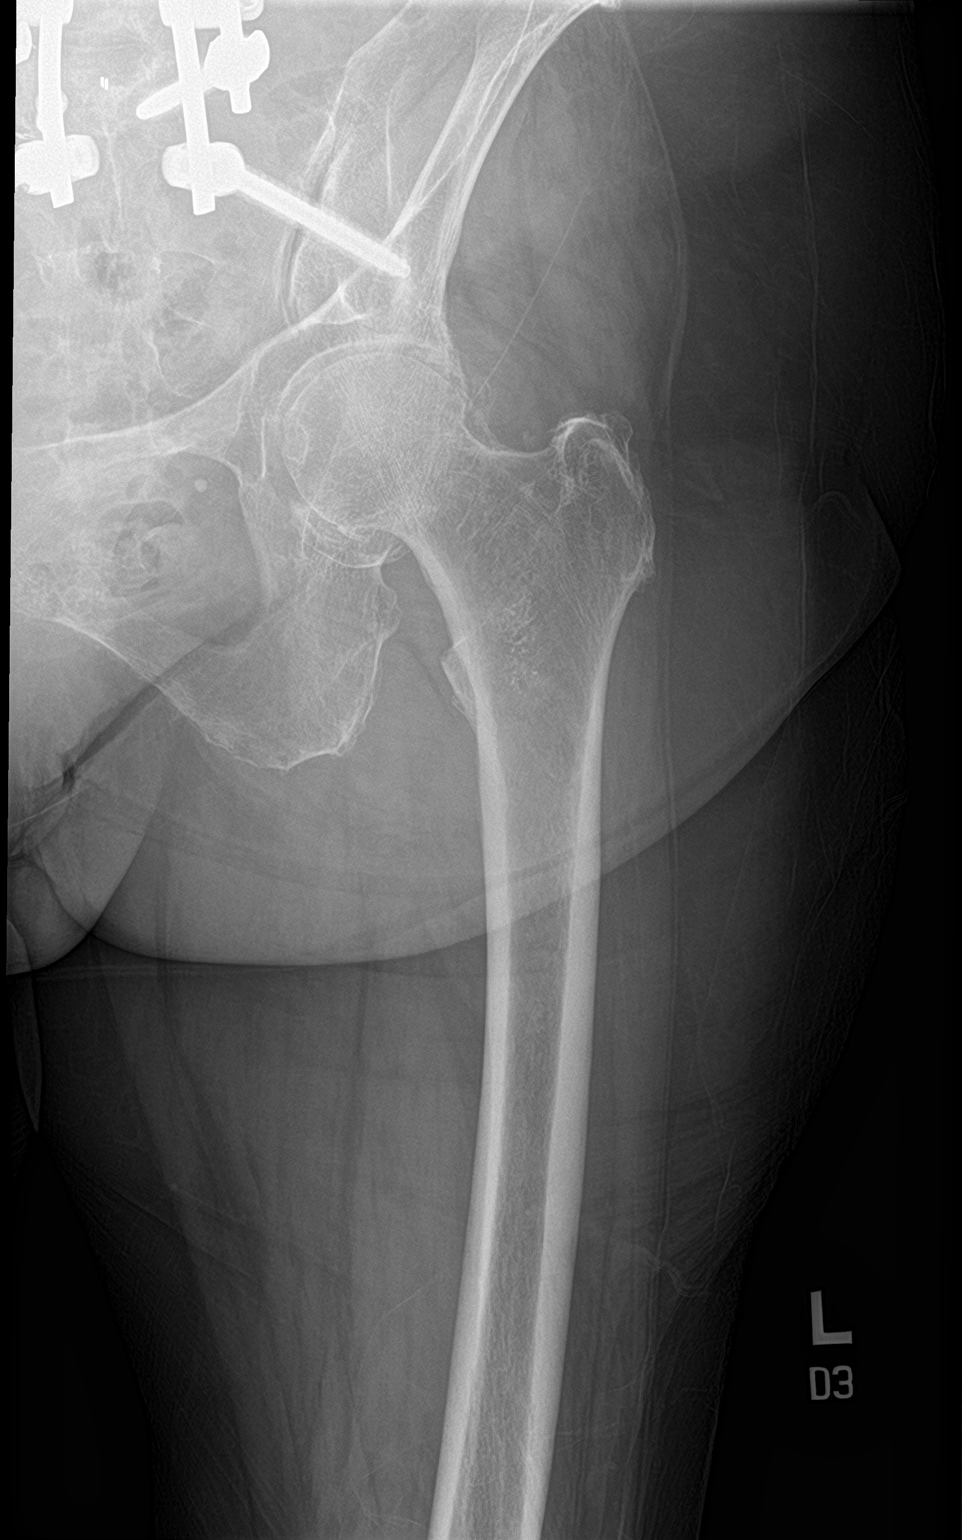

[hip lat]
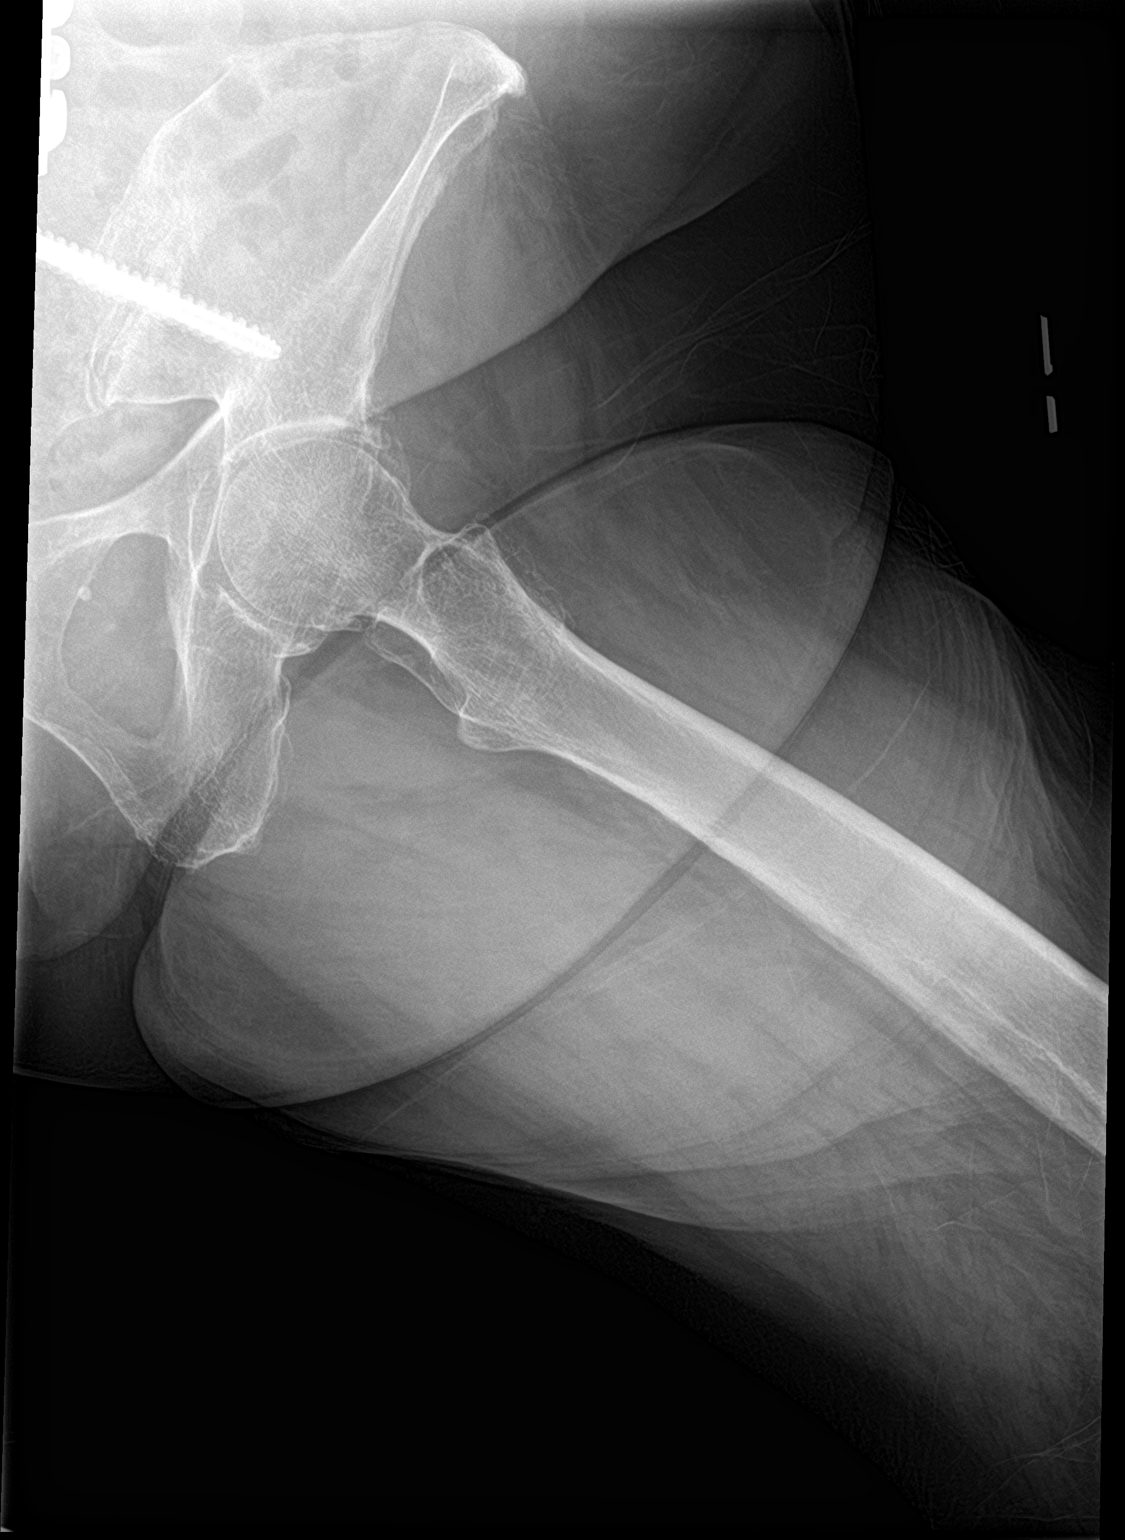

[pelvis ap]
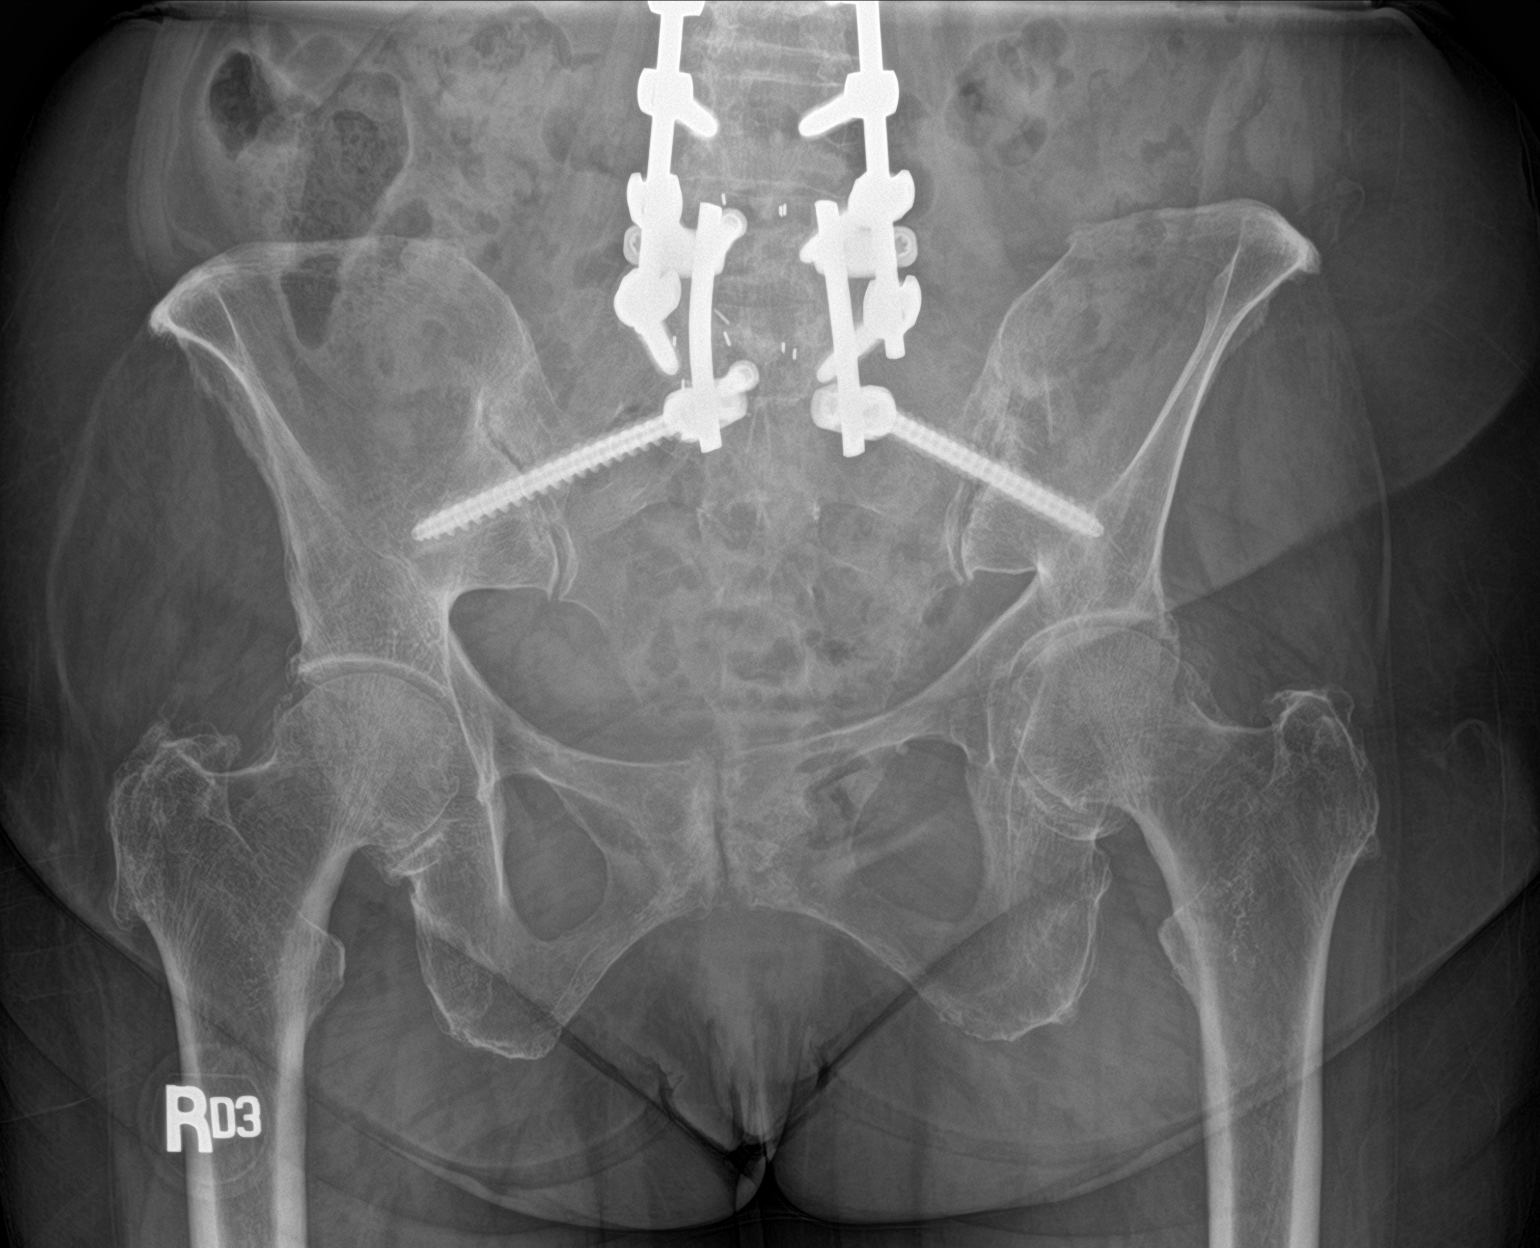

[3 of 3 positions shown; findings below may reference images not displayed]

FINDINGS: Postoperative changes in the lower lumbar spine. Bilateral SI screws
noted. Mild symmetric degenerative changes in the hips with joint
space narrowing and spurring. No acute bony abnormality.
Specifically, no fracture, subluxation, or dislocation.
IMPRESSION: Mild symmetric degenerative changes in the hips. No acute bony
abnormality.

## 2020-05-08 NOTE — Patient Instructions (Addendum)
____________________________________________________________________________________________  General Risks and Possible Complications  Patient Responsibilities: It is important that you read this as it is part of your informed consent. It is our duty to inform you of the risks and possible complications associated with treatments offered to you. It is your responsibility as a patient to read this and to ask questions about anything that is not clear or that you believe was not covered in this document.  Patient's Rights: You have the right to refuse treatment. You also have the right to change your mind, even after initially having agreed to have the treatment done. However, under this last option, if you wait until the last second to change your mind, you may be charged for the materials used up to that point.  Introduction: Medicine is not an exact science. Everything in Medicine, including the lack of treatment(s), carries the potential for danger, harm, or loss (which is by definition: Risk). In Medicine, a complication is a secondary problem, condition, or disease that can aggravate an already existing one. All treatments carry the risk of possible complications. The fact that a side effects or complications occurs, does not imply that the treatment was conducted incorrectly. It must be clearly understood that these can happen even when everything is done following the highest safety standards.  No treatment: You can choose not to proceed with the proposed treatment alternative. The "PRO(s)" would include: avoiding the risk of complications associated with the therapy. The "CON(s)" would include: not getting any of the treatment benefits. These benefits fall under one of three categories: diagnostic; therapeutic; and/or palliative. Diagnostic benefits include: getting information which can ultimately lead to improvement of the disease or symptom(s). Therapeutic benefits are those associated with the  successful treatment of the disease. Finally, palliative benefits are those related to the decrease of the primary symptoms, without necessarily curing the condition (example: decreasing the pain from a flare-up of a chronic condition, such as incurable terminal cancer).  General Risks and Complications: These are associated to most interventional treatments. They can occur alone, or in combination. They fall under one of the following six (6) categories: no benefit or worsening of symptoms; bleeding; infection; nerve damage; allergic reactions; and/or death. 1. No benefits or worsening of symptoms: In Medicine there are no guarantees, only probabilities. No healthcare provider can ever guarantee that a medical treatment will work, they can only state the probability that it may. Furthermore, there is always the possibility that the condition may worsen, either directly, or indirectly, as a consequence of the treatment. 2. Bleeding: This is more common if the patient is taking a blood thinner, either prescription or over the counter (example: Goody Powders, Fish oil, Aspirin, Garlic, etc.), or if suffering a condition associated with impaired coagulation (example: Hemophilia, cirrhosis of the liver, low platelet counts, etc.). However, even if you do not have one on these, it can still happen. If you have any of these conditions, or take one of these drugs, make sure to notify your treating physician. 3. Infection: This is more common in patients with a compromised immune system, either due to disease (example: diabetes, cancer, human immunodeficiency virus [HIV], etc.), or due to medications or treatments (example: therapies used to treat cancer and rheumatological diseases). However, even if you do not have one on these, it can still happen. If you have any of these conditions, or take one of these drugs, make sure to notify your treating physician. 4. Nerve Damage: This is more common when the   treatment is  an invasive one, but it can also happen with the use of medications, such as those used in the treatment of cancer. The damage can occur to small secondary nerves, or to large primary ones, such as those in the spinal cord and brain. This damage may be temporary or permanent and it may lead to impairments that can range from temporary numbness to permanent paralysis and/or brain death. 5. Allergic Reactions: Any time a substance or material comes in contact with our body, there is the possibility of an allergic reaction. These can range from a mild skin rash (contact dermatitis) to a severe systemic reaction (anaphylactic reaction), which can result in death. 6. Death: In general, any medical intervention can result in death, most of the time due to an unforeseen complication. ____________________________________________________________________________________________  ____________________________________________________________________________________________  Preparing for Procedure with Sedation  Procedure appointments are limited to planned procedures: . No Prescription Refills. . No disability issues will be discussed. . No medication changes will be discussed.  Instructions: . Oral Intake: Do not eat or drink anything for at least 8 hours prior to your procedure. (Exception: Blood Pressure Medication. See below.) . Transportation: Unless otherwise stated by your physician, you may drive yourself after the procedure. . Blood Pressure Medicine: Do not forget to take your blood pressure medicine with a sip of water the morning of the procedure. If your Diastolic (lower reading)is above 100 mmHg, elective cases will be cancelled/rescheduled. . Blood thinners: These will need to be stopped for procedures. Notify our staff if you are taking any blood thinners. Depending on which one you take, there will be specific instructions on how and when to stop it. . Diabetics on insulin: Notify the staff  so that you can be scheduled 1st case in the morning. If your diabetes requires high dose insulin, take only  of your normal insulin dose the morning of the procedure and notify the staff that you have done so. . Preventing infections: Shower with an antibacterial soap the morning of your procedure. . Build-up your immune system: Take 1000 mg of Vitamin C with every meal (3 times a day) the day prior to your procedure. . Antibiotics: Inform the staff if you have a condition or reason that requires you to take antibiotics before dental procedures. . Pregnancy: If you are pregnant, call and cancel the procedure. . Sickness: If you have a cold, fever, or any active infections, call and cancel the procedure. . Arrival: You must be in the facility at least 30 minutes prior to your scheduled procedure. . Children: Do not bring children with you. . Dress appropriately: Bring dark clothing that you would not mind if they get stained. . Valuables: Do not bring any jewelry or valuables.  Reasons to call and reschedule or cancel your procedure: (Following these recommendations will minimize the risk of a serious complication.) . Surgeries: Avoid having procedures within 2 weeks of any surgery. (Avoid for 2 weeks before or after any surgery). . Flu Shots: Avoid having procedures within 2 weeks of a flu shots or . (Avoid for 2 weeks before or after immunizations). . Barium: Avoid having a procedure within 7-10 days after having had a radiological study involving the use of radiological contrast. (Myelograms, Barium swallow or enema study). . Heart attacks: Avoid any elective procedures or surgeries for the initial 6 months after a "Myocardial Infarction" (Heart Attack). . Blood thinners: It is imperative that you stop these medications before procedures. Let us know if you   if you take any blood thinner.  . Infection: Avoid procedures during or within two weeks of an infection (including chest colds or  gastrointestinal problems). Symptoms associated with infections include: Localized redness, fever, chills, night sweats or profuse sweating, burning sensation when voiding, cough, congestion, stuffiness, runny nose, sore throat, diarrhea, nausea, vomiting, cold or Flu symptoms, recent or current infections. It is specially important if the infection is over the area that we intend to treat. Marland Kitchen Heart and lung problems: Symptoms that may suggest an active cardiopulmonary problem include: cough, chest pain, breathing difficulties or shortness of breath, dizziness, ankle swelling, uncontrolled high or unusually low blood pressure, and/or palpitations. If you are experiencing any of these symptoms, cancel your procedure and contact your primary care physician for an evaluation.  Remember:  Regular Business hours are:  Monday to Thursday 8:00 AM to 4:00 PM  Provider's Schedule: Milinda Pointer, MD:  Procedure days: Tuesday and Thursday 7:30 AM to 4:00 PM  Gillis Santa, MD:  Procedure days: Monday and Wednesday 7:30 AM to 4:00 PM ____________________________________________________________________________________________  ____________________________________________________________________________________________  Preparing for Procedure with Sedation  Procedure appointments are limited to planned procedures: . No Prescription Refills. . No disability issues will be discussed. . No medication changes will be discussed.  Instructions: . Oral Intake: Do not eat or drink anything for at least 8 hours prior to your procedure. (Exception: Blood Pressure Medication. See below.) . Transportation: Unless otherwise stated by your physician, you may drive yourself after the procedure. . Blood Pressure Medicine: Do not forget to take your blood pressure medicine with a sip of water the morning of the procedure. If your Diastolic (lower reading)is above 100 mmHg, elective cases will be  cancelled/rescheduled. . Blood thinners: These will need to be stopped for procedures. Notify our staff if you are taking any blood thinners. Depending on which one you take, there will be specific instructions on how and when to stop it. . Diabetics on insulin: Notify the staff so that you can be scheduled 1st case in the morning. If your diabetes requires high dose insulin, take only  of your normal insulin dose the morning of the procedure and notify the staff that you have done so. . Preventing infections: Shower with an antibacterial soap the morning of your procedure. . Build-up your immune system: Take 1000 mg of Vitamin C with every meal (3 times a day) the day prior to your procedure. Marland Kitchen Antibiotics: Inform the staff if you have a condition or reason that requires you to take antibiotics before dental procedures. . Pregnancy: If you are pregnant, call and cancel the procedure. . Sickness: If you have a cold, fever, or any active infections, call and cancel the procedure. . Arrival: You must be in the facility at least 30 minutes prior to your scheduled procedure. . Children: Do not bring children with you. . Dress appropriately: Bring dark clothing that you would not mind if they get stained. . Valuables: Do not bring any jewelry or valuables.  Reasons to call and reschedule or cancel your procedure: (Following these recommendations will minimize the risk of a serious complication.) . Surgeries: Avoid having procedures within 2 weeks of any surgery. (Avoid for 2 weeks before or after any surgery). . Flu Shots: Avoid having procedures within 2 weeks of a flu shots or . (Avoid for 2 weeks before or after immunizations). . Barium: Avoid having a procedure within 7-10 days after having had a radiological study involving the use of radiological contrast. (  Myelograms, Barium swallow or enema study). . Heart attacks: Avoid any elective procedures or surgeries for the initial 6 months after a  "Myocardial Infarction" (Heart Attack). . Blood thinners: It is imperative that you stop these medications before procedures. Let us know if you if you take any blood thinner.  . Infection: Avoid procedures during or within two weeks of an infection (including chest colds or gastrointestinal problems). Symptoms associated with infections include: Localized redness, fever, chills, night sweats or profuse sweating, burning sensation when voiding, cough, congestion, stuffiness, runny nose, sore throat, diarrhea, nausea, vomiting, cold or Flu symptoms, recent or current infections. It is specially important if the infection is over the area that we intend to treat. Marland Kitchen Heart and lung problems: Symptoms that may suggest an active cardiopulmonary problem include: cough, chest pain, breathing difficulties or shortness of breath, dizziness, ankle swelling, uncontrolled high or unusually low blood pressure, and/or palpitations. If you are experiencing any of these symptoms, cancel your procedure and contact your primary care physician for an evaluation.  Remember:  Regular Business hours are:  Monday to Thursday 8:00 AM to 4:00 PM  Provider's Schedule: Milinda Pointer, MD:  Procedure days: Tuesday and Thursday 7:30 AM to 4:00 PM  Gillis Santa, MD:  Procedure days: Monday and Wednesday 7:30 AM to 4:00 PM ____________________________________________________________________________________________  Facet Blocks Patient Information  Description: The facets are joints in the spine between the vertebrae.  Like any joints in the body, facets can become irritated and painful.  Arthritis can also effect the facets.  By injecting steroids and local anesthetic in and around these joints, we can temporarily block the nerve supply to them.  Steroids act directly on irritated nerves and tissues to reduce selling and inflammation which often leads to decreased pain.  Facet blocks may be done anywhere along the spine  from the neck to the low back depending upon the location of your pain.   After numbing the skin with local anesthetic (like Novocaine), a small needle is passed onto the facet joints under x-ray guidance.  You may experience a sensation of pressure while this is being done.  The entire block usually lasts about 15-25 minutes.   Conditions which may be treated by facet blocks:   Low back/buttock pain  Neck/shoulder pain  Certain types of headaches  Preparation for the injection:  1. Do not eat any solid food or dairy products within 6 hours of your appointment. 2. You may drink clear liquid up to 3 hours before appointment.  Clear liquids include water, black coffee, juice or soda.  No milk or cream please. 3. You may take your regular medication, including pain medications, with a sip of water before your appointment.  Diabetics should hold regular insulin (if taken separately) and take 1/2 normal NPH dose the morning of the procedure.  Carry some sugar containing items with you to your appointment. 4. A driver must accompany you and be prepared to drive you home after your procedure. 5. Bring all your current medications with you. 6. An IV may be inserted and sedation may be given at the discretion of the physician. 7. A blood pressure cuff, EKG and other monitors will often be applied during the procedure.  Some patients may need to have extra oxygen administered for a short period. 8. You will be asked to provide medical information, including your allergies and medications, prior to the procedure.  We must know immediately if you are taking blood thinners (like Coumadin/Warfarin) or if  you are allergic to IV iodine contrast (dye).  We must know if you could possible be pregnant.  Possible side-effects:   Bleeding from needle site  Infection (rare, may require surgery)  Nerve injury (rare)  Numbness & tingling (temporary)  Difficulty urinating (rare, temporary)  Spinal  headache (a headache worse with upright posture)  Light-headedness (temporary)  Pain at injection site (serveral days)  Decreased blood pressure (rare, temporary)  Weakness in arm/leg (temporary)  Pressure sensation in back/neck (temporary)   Call if you experience:   Fever/chills associated with headache or increased back/neck pain  Headache worsened by an upright position  New onset, weakness or numbness of an extremity below the injection site  Hives or difficulty breathing (go to the emergency room)  Inflammation or drainage at the injection site(s)  Severe back/neck pain greater than usual  New symptoms which are concerning to you  Please note:  Although the local anesthetic injected can often make your back or neck feel good for several hours after the injection, the pain will likely return. It takes 3-7 days for steroids to work.  You may not notice any pain relief for at least one week.  If effective, we will often do a series of 2-3 injections spaced 3-6 weeks apart to maximally decrease your pain.  After the initial series, you may be a candidate for a more permanent nerve block of the facets.  If you have any questions, please call #[phone]) [facility]

## 2020-05-08 NOTE — Progress Notes (Signed)
Safety precautions to be maintained throughout the outpatient stay will include: orient to surroundings, keep bed in low position, maintain call bell within reach at all times, provide assistance with transfer out of bed and ambulation.  

## 2020-05-11 ENCOUNTER — Other Ambulatory Visit: Payer: Self-pay

## 2020-05-11 ENCOUNTER — Ambulatory Visit
Admission: RE | Admit: 2020-05-11 | Discharge: 2020-05-11 | Disposition: A | Discharge status: Home / Self Care | Payer: Medicare Other | Source: Ambulatory Visit | Attending: Pain Medicine | Admitting: Pain Medicine

## 2020-05-11 ENCOUNTER — Ambulatory Visit (HOSPITAL_BASED_OUTPATIENT_CLINIC_OR_DEPARTMENT_OTHER): Payer: Medicare Other | Admitting: Pain Medicine

## 2020-05-11 ENCOUNTER — Other Ambulatory Visit
Admission: RE | Admit: 2020-05-11 | Discharge: 2020-05-11 | Disposition: A | Discharge status: Home / Self Care | Payer: Medicare Other | Source: Ambulatory Visit | Attending: Gastroenterology | Admitting: Gastroenterology

## 2020-05-11 ENCOUNTER — Encounter: Payer: Self-pay | Admitting: Pain Medicine

## 2020-05-11 VITALS — BP 171/92 | HR 70 | Temp 97.0°F | Resp 13 | Ht 59.0 in | Wt 125.0 lb

## 2020-05-11 DIAGNOSIS — M546 Pain in thoracic spine: Secondary | ICD-10-CM | POA: Insufficient documentation

## 2020-05-11 DIAGNOSIS — M47814 Spondylosis without myelopathy or radiculopathy, thoracic region: Secondary | ICD-10-CM | POA: Insufficient documentation

## 2020-05-11 DIAGNOSIS — R937 Abnormal findings on diagnostic imaging of other parts of musculoskeletal system: Secondary | ICD-10-CM | POA: Insufficient documentation

## 2020-05-11 DIAGNOSIS — E559 Vitamin D deficiency, unspecified: Secondary | ICD-10-CM | POA: Insufficient documentation

## 2020-05-11 DIAGNOSIS — G8929 Other chronic pain: Secondary | ICD-10-CM | POA: Insufficient documentation

## 2020-05-11 DIAGNOSIS — M160 Bilateral primary osteoarthritis of hip: Secondary | ICD-10-CM | POA: Insufficient documentation

## 2020-05-11 DIAGNOSIS — M47894 Other spondylosis, thoracic region: Secondary | ICD-10-CM

## 2020-05-11 DIAGNOSIS — S22060S Wedge compression fracture of T7-T8 vertebra, sequela: Secondary | ICD-10-CM

## 2020-05-11 DIAGNOSIS — Z20822 Contact with and (suspected) exposure to covid-19: Secondary | ICD-10-CM | POA: Diagnosis not present

## 2020-05-11 DIAGNOSIS — S22060A Wedge compression fracture of T7-T8 vertebra, initial encounter for closed fracture: Secondary | ICD-10-CM | POA: Insufficient documentation

## 2020-05-11 LAB — SARS CORONAVIRUS 2 (TAT 6-24 HRS): SARS Coronavirus 2: NEGATIVE

## 2020-05-11 MED ORDER — ROPIVACAINE HCL 2 MG/ML IJ SOLN
18.0000 mL | Freq: Once | INTRAMUSCULAR | Status: DC
  Filled 2020-05-11: qty 20

## 2020-05-11 MED ORDER — DEXAMETHASONE SODIUM PHOSPHATE 10 MG/ML IJ SOLN
20.0000 mg | Freq: Once | INTRAMUSCULAR | Status: DC
  Filled 2020-05-11: qty 2

## 2020-05-11 MED ORDER — LACTATED RINGERS IV SOLN: 1000.0000 mL | Freq: Once | INTRAVENOUS | Status: DC

## 2020-05-11 MED ORDER — ERGOCALCIFEROL 1.25 MG (50000 UT) PO CAPS: 50000.0000 [IU] | ORAL_CAPSULE | ORAL | 0 refills | Status: AC

## 2020-05-11 MED ORDER — MIDAZOLAM HCL 5 MG/5ML IJ SOLN: 1.0000 mg | INTRAMUSCULAR | Status: DC | PRN

## 2020-05-11 MED ORDER — TRIAMCINOLONE ACETONIDE 40 MG/ML IJ SUSP
INTRAMUSCULAR | Status: AC
  Filled 2020-05-11: qty 1

## 2020-05-11 MED ORDER — ROPIVACAINE HCL 2 MG/ML IJ SOLN
18.0000 mL | Freq: Once | INTRAMUSCULAR | Status: AC
  Administered 2020-05-11: 18 mL via PERINEURAL

## 2020-05-11 MED ORDER — LIDOCAINE HCL 2 % IJ SOLN
20.0000 mL | Freq: Once | INTRAMUSCULAR | Status: AC
  Administered 2020-05-11: 400 mg
  Filled 2020-05-11: qty 20

## 2020-05-11 MED ORDER — FENTANYL CITRATE (PF) 100 MCG/2ML IJ SOLN: 25.0000 ug | INTRAMUSCULAR | Status: DC | PRN

## 2020-05-11 MED ORDER — VITAMIN D3 125 MCG (5000 UT) PO CAPS: 1.0000 | ORAL_CAPSULE | Freq: Every day | ORAL | 5 refills | Status: DC

## 2020-05-11 MED ORDER — TRIAMCINOLONE ACETONIDE 40 MG/ML IJ SUSP
80.0000 mg | Freq: Once | INTRAMUSCULAR | Status: AC
  Administered 2020-05-11: 80 mg

## 2020-05-11 NOTE — Progress Notes (Signed)
PROVIDER NOTE: Information contained herein reflects review and annotations entered in association with encounter. Interpretation of such information and data should be left to medically-trained personnel. Information provided to patient can be located elsewhere in the medical record under "Patient Instructions". Document created using STT-dictation technology, any transcriptional errors that may result from process are unintentional.    Patient: Maria Spencer  Service Category: Procedure  Provider: Gaspar Cola, MD  DOB: [birth year]  DOS: 05/11/2020  Location: [facility]  MRN: [id number]  Setting: Ambulatory - outpatient  Referring Provider: McLean-Scocuzza, Olivia Mackie *  Type: Established Patient  Specialty: Interventional Pain Management  PCP: McLean-Scocuzza, Nino Glow, MD   Primary Reason for Visit: Interventional Pain Management Treatment. CC: Back Pain (mid)  Procedure:          Anesthesia, Analgesia, Anxiolysis:  Type: Diagnostic Medial Branch Facet Block  #1  Region:Thoracic Level: T9, T10, T11, T12  Medial Branch Level(s) Laterality: Bilateral  Type: Local Anesthesia Indication(s): Analgesia         Route: Infiltration ([place]/IM) IV Access: Declined Sedation: Declined  Local Anesthetic: Lidocaine 1-2%  Position: Prone   Indications: 1. Thoracic facet syndrome (Bilateral)   2. Spondylosis without myelopathy or radiculopathy, thoracic region   3. Chronic thoracic back pain (1ry area of Pain) (Bilateral) (R>L)   4. Closed wedge compression fracture of T8 vertebra, sequela   5. Osteoarthritis of hips (Bilateral)   6. Abnormal thoracolumbar CT myelogram (01/05/2020)    Pain Score: Pre-procedure: 6 /10 Post-procedure: 0-No pain/10   Pre-op Assessment:  Maria Spencer is a 74 y.o. (year old), female patient, seen today for interventional treatment. She  has a past surgical history that includes Total shoulder replacement; Shoulder surgery; Tonsillectomy; Bone graft hip  iliac crest; Spine surgery (10/23/2017); Breast lumpectomy (Right, 2009); Ventral hernia repair (N/A, 05/29/2018); Insertion of mesh (N/A, 05/29/2018); Breast biopsy (Left, 2009); Breast biopsy (Right, 2009); Eye surgery; and Cardiac catheterization. Maria Spencer has a current medication list which includes the following prescription(s): atorvastatin, cephalexin, fluticasone, forteo, levothyroxine, lisinopril, metoprolol succinate, multi-vitamins, nitroglycerin, suprep bowel prep kit, zolpidem, vitamin d3, and ergocalciferol, and the following Facility-Administered Medications: dexamethasone and ropivacaine (pf) 2 mg/ml (0.2%). Her primarily concern today is the Back Pain (mid)  Initial Vital Signs:  Pulse/HCG Rate: 90  Temp: (!) 97 F (36.1 C) Resp: 18 BP: 133/74 SpO2: 100 %  BMI: Estimated body mass index is 25.25 kg/m as calculated from the following:   Height as of this encounter: '4\' 11"'  (1.499 m).   Weight as of this encounter: 125 lb (56.7 kg).  Risk Assessment: Allergies: Reviewed. She is allergic to sulfa antibiotics, nitrofuran derivatives, amlodipine besylate, and nitrofurantoin.  Allergy Precautions: None required Coagulopathies: Reviewed. None identified.  Blood-thinner therapy: None at this time Active Infection(s): Reviewed. None identified. Maria Spencer is afebrile  Site Confirmation: Maria Spencer was asked to confirm the procedure and laterality before marking the site Procedure checklist: Completed Consent: Before the procedure and under the influence of no sedative(s), amnesic(s), or anxiolytics, the patient was informed of the treatment options, risks and possible complications. To fulfill our ethical and legal obligations, as recommended by the American Medical Association's Code of Ethics, I have informed the patient of my clinical impression; the nature and purpose of the treatment or procedure; the risks, benefits, and possible complications of the intervention; the alternatives,  including doing nothing; the risk(s) and benefit(s) of the alternative treatment(s) or procedure(s); and the risk(s) and benefit(s) of doing nothing. The patient  was provided information about the general risks and possible complications associated with the procedure. These may include, but are not limited to: failure to achieve desired goals, infection, bleeding, organ or nerve damage, allergic reactions, paralysis, and death. In addition, the patient was informed of those risks and complications associated to Spine-related procedures, such as failure to decrease pain; infection (i.e.: Meningitis, epidural or intraspinal abscess); bleeding (i.e.: epidural hematoma, subarachnoid hemorrhage, or any other type of intraspinal or peri-dural bleeding); organ or nerve damage (i.e.: Any type of peripheral nerve, nerve root, or spinal cord injury) with subsequent damage to sensory, motor, and/or autonomic systems, resulting in permanent pain, numbness, and/or weakness of one or several areas of the body; allergic reactions; (i.e.: anaphylactic reaction); and/or death. Furthermore, the patient was informed of those risks and complications associated with the medications. These include, but are not limited to: allergic reactions (i.e.: anaphylactic or anaphylactoid reaction(s)); adrenal axis suppression; blood sugar elevation that in diabetics may result in ketoacidosis or comma; water retention that in patients with history of congestive heart failure may result in shortness of breath, pulmonary edema, and decompensation with resultant heart failure; weight gain; swelling or edema; medication-induced neural toxicity; particulate matter embolism and blood vessel occlusion with resultant organ, and/or nervous system infarction; and/or aseptic necrosis of one or more joints. Finally, the patient was informed that Medicine is not an exact science; therefore, there is also the possibility of unforeseen or unpredictable risks  and/or possible complications that may result in a catastrophic outcome. The patient indicated having understood very clearly. We have given the patient no guarantees and we have made no promises. Enough time was given to the patient to ask questions, all of which were answered to the patient's satisfaction. Ms. Noyes has indicated that she wanted to continue with the procedure. Attestation: I, the ordering provider, attest that I have discussed with the patient the benefits, risks, side-effects, alternatives, likelihood of achieving goals, and potential problems during recovery for the procedure that I have provided informed consent. Date  Time: 05/11/2020  8:46 AM  Pre-Procedure Preparation:  Monitoring: As per clinic protocol. Respiration, ETCO2, SpO2, BP, heart rate and rhythm monitor placed and checked for adequate function Safety Precautions: Patient was assessed for positional comfort and pressure points before starting the procedure. Time-out: I initiated and conducted the "Time-out" before starting the procedure, as per protocol. The patient was asked to participate by confirming the accuracy of the "Time Out" information. Verification of the correct person, site, and procedure were performed and confirmed by me, the nursing staff, and the patient. "Time-out" conducted as per Joint Commission's Universal Protocol (UP.01.01.01). Time: 3748  Description of Procedure:          Target Area: The target area is a superior and lateral portion of the thoracic transverse processes more distal to the joint itself then and the lumbar region. Approach: Paraspinal approach. Area Prepped: Entire Posterior Thoracic Region DuraPrep (Iodine Povacrylex [0.7% available iodine] and Isopropyl Alcohol, 74% w/w) Safety Precautions: Aspiration looking for blood return was conducted prior to all injections. At no point did we inject any substances, as a needle was being advanced. No attempts were made at seeking any  paresthesias. Safe injection practices and needle disposal techniques used. Medications properly checked for expiration dates. SDV (single dose vial) medications used. Description of the Procedure: Protocol guidelines were followed. The patient was placed in position over the fluoroscopy table. The target area was identified and the area prepped in the usual manner.  Skin & deeper tissues infiltrated with local anesthetic. Appropriate amount of time allowed to pass for local anesthetics to take effect. The procedure needles were then advanced to the target area. Proper needle placement secured. Negative aspiration confirmed. Solution injected in intermittent fashion, asking for systemic symptoms every 0.5cc of injectate. The needles were then removed and the area cleansed, making sure to leave some of the prepping solution back to take advantage of its long term bactericidal properties.       Vitals:   05/11/20 0842 05/11/20 0955 05/11/20 1005 05/11/20 1015  BP: 133/74 (!) 159/68 (!) 168/99 (!) 171/92  Pulse: 90 73 69 70  Resp: '18 16 12 13  ' Temp: (!) 97 F (36.1 C)     SpO2: 100% 98% 98% 97%  Weight: 125 lb (56.7 kg)     Height: '4\' 11"'  (1.499 m)       Start Time: 0958 hrs. End Time: 1015 hrs. Imaging Guidance (Spinal):          Type of Imaging Technique: Fluoroscopy Guidance (Spinal) Indication(s): Assistance in needle guidance and placement for procedures requiring needle placement in or near specific anatomical locations not easily accessible without such assistance. Exposure Time: Please see nurses notes. Contrast: None used. Fluoroscopic Guidance: I was personally present during the use of fluoroscopy. "Tunnel Vision Technique" used to obtain the best possible view of the target area. Parallax error corrected before commencing the procedure. "Direction-depth-direction" technique used to introduce the needle under continuous pulsed fluoroscopy. Once target was reached, antero-posterior,  oblique, and lateral fluoroscopic projection used confirm needle placement in all planes. Images permanently stored in EMR. Interpretation: No contrast injected. I personally interpreted the imaging intraoperatively. Adequate needle placement confirmed in multiple planes. Permanent images saved into the patient's record.  Antibiotic Prophylaxis:   Anti-infectives (From admission, onward)   None     Indication(s): None identified  Post-operative Assessment:  Post-procedure Vital Signs:  Pulse/HCG Rate: 70  Temp: (!) 97 F (36.1 C) Resp: 13 BP: (!) 171/92 SpO2: 97 %  EBL: None  Complications: No immediate post-treatment complications observed by team, or reported by patient.  Note: The patient tolerated the entire procedure well. A repeat set of vitals were taken after the procedure and the patient was kept under observation following institutional policy, for this type of procedure. Post-procedural neurological assessment was performed, showing return to baseline, prior to discharge. The patient was provided with post-procedure discharge instructions, including a section on how to identify potential problems. Should any problems arise concerning this procedure, the patient was given instructions to immediately contact us, at any time, without hesitation. In any case, we plan to contact the patient by telephone for a follow-up status report regarding this interventional procedure.  Comments:  No additional relevant information.  Plan of Care  Orders:  Orders Placed This Encounter  Procedures  . THORACIC FACET BLOCK    Scheduling Instructions:     Thoracic Medial Branch Block     Side: Bilateral     Sedation: Patient's choice.     Timeframe: Today    Order Specific Question:   Where will this procedure be performed?    Answer:   [facility]  . DG [facility] C-ARM 1-60 MIN NO REPORT    Intraoperative interpretation by procedural physician at [place].     Standing Status:   Standing    Number of Occurrences:   1    Order Specific Question:   Reason for exam:  Answer:   Assistance in needle guidance and placement for procedures requiring needle placement in or near specific anatomical locations not easily accessible without such assistance.  . Informed Consent Details: Physician/Practitioner Attestation; Transcribe to consent form and obtain patient signature    Provider Attestation: I, [place] Dossie Arbour, MD, (Pain Management Specialist), the physician/practitioner, attest that I have discussed with the patient the benefits, risks, side effects, alternatives, likelihood of achieving goals and potential problems during recovery for the procedure that I have provided informed consent.    Scheduling Instructions:     Nursing Order: Transcribe to consent form and obtain patient signature.     Procedure: Thoracic medial branch facet block     Indication/reason: Thoracic upper back pain associated with thoracic facet syndrome (V67.014) and thoracic spondylosis without myelopathy or radiculopathy (D03.013)     Note: Always confirm laterality of pain with Ms. Arington, before procedure.  . Provide equipment / supplies at bedside    Equipment required: Single use, disposable, "Block Tray"    Standing Status:   Standing    Number of Occurrences:   1    Order Specific Question:   Specify    Answer:   Block Tray   Chronic Opioid Analgesic:  None Highest recorded MME/day: 75 mg/day MME/day: 0 mg/day   Medications ordered for procedure: Meds ordered this encounter  Medications  . lidocaine (XYLOCAINE) 2 % (with pres) injection 400 mg  . DISCONTD: lactated ringers infusion 1,000 mL  . DISCONTD: midazolam (VERSED) 5 MG/5ML injection 1-2 mg    Make sure Flumazenil is available in the pyxis when using this medication. If oversedation occurs, administer 0.2 mg IV over 15 sec. If after 45 sec no response, administer 0.2 mg again over 1 min; may repeat at 1  min intervals; not to exceed 4 doses (1 mg)  . DISCONTD: fentaNYL (SUBLIMAZE) injection 25-50 mcg    Make sure Narcan is available in the pyxis when using this medication. In the event of respiratory depression (RR< 8/min): Titrate NARCAN (naloxone) in increments of 0.1 to 0.2 mg IV at 2-3 minute intervals, until desired degree of reversal.  . ropivacaine (PF) 2 mg/mL (0.2%) (NAROPIN) injection 18 mL  . dexamethasone (DECADRON) injection 20 mg  . ergocalciferol (VITAMIN D2) 1.25 MG (50000 UT) capsule    Sig: Take 1 capsule (50,000 Units total) by mouth 2 (two) times a week. X 6 weeks.    Dispense:  12 capsule    Refill:  0    Fill one day early if pharmacy is closed on scheduled refill date. May substitute for generic, or similar, if available.  . Cholecalciferol (VITAMIN D3) 125 MCG (5000 UT) CAPS    Sig: Take 1 capsule (5,000 Units total) by mouth daily with breakfast. Take along with calcium and magnesium.    Dispense:  30 capsule    Refill:  5    Fill one day early if pharmacy is closed on scheduled refill date. May substitute for generic, or similar, if available.  . ropivacaine (PF) 2 mg/mL (0.2%) (NAROPIN) injection 18 mL  . triamcinolone acetonide (KENALOG-40) injection 80 mg   Medications administered: We administered lidocaine, ropivacaine (PF) 2 mg/mL (0.2%), and triamcinolone acetonide.  See the medical record for exact dosing, route, and time of administration.  Follow-up plan:   Return in about 2 weeks (around 05/25/2020) for (VV), (PP).       Interventional treatment options: Planned, scheduled, and/or pending:   Diagnostic bilateral T9, T10, T11, and  T12 MMB thoracic facet block #1 (today)   Under consideration:   Diagnostic bilateral thoracic facet block #2 (levels to be determined based on the patient's pain pattern and x-rays delineating the upper and lower borders of the pain)   Therapeutic/palliative (PRN):   None at this time     Recent Visits Date Type  Provider Dept  05/08/20 Office Visit Milinda Pointer, MD [facility]  Showing recent visits within past 90 days and meeting all other requirements Today's Visits Date Type Provider Dept  05/11/20 Procedure visit Milinda Pointer, MD [facility]  Showing today's visits and meeting all other requirements Future Appointments Date Type Provider Dept  06/01/20 Appointment Milinda Pointer, MD [facility]  Showing future appointments within next 90 days and meeting all other requirements  Disposition: Discharge home  Discharge (Date  Time): 05/11/2020; 1027 hrs.   Primary Care Physician: McLean-Scocuzza, Nino Glow, MD Location: [facility] Note by: Gaspar Cola, MD Date: 05/11/2020; Time: 5:35 PM  Disclaimer:  Medicine is not an Chief Strategy Officer. The only guarantee in medicine is that nothing is guaranteed. It is important to note that the decision to proceed with this intervention was based on the information collected from the patient. The Data and conclusions were drawn from the patient's questionnaire, the interview, and the physical examination. Because the information was provided in large part by the patient, it cannot be guaranteed that it has not been purposely or unconsciously manipulated. Every effort has been made to obtain as much relevant data as possible for this evaluation. It is important to note that the conclusions that lead to this procedure are derived in large part from the available data. Always take into account that the treatment will also be dependent on availability of resources and existing treatment guidelines, considered by other Pain Management Practitioners as being common knowledge and practice, at the time of the intervention. For Medico-Legal purposes, it is also important to point out that variation in procedural techniques and pharmacological choices are the acceptable norm. The indications,  contraindications, technique, and results of the above procedure should only be interpreted and judged by a Board-Certified Interventional Pain Specialist with extensive familiarity and expertise in the same exact procedure and technique.

## 2020-05-11 NOTE — Progress Notes (Signed)
Safety precautions to be maintained throughout the outpatient stay will include: orient to surroundings, keep bed in low position, maintain call bell within reach at all times, provide assistance with transfer out of bed and ambulation.  

## 2020-05-11 NOTE — Patient Instructions (Signed)

## 2020-05-12 ENCOUNTER — Telehealth: Payer: Self-pay

## 2020-05-12 NOTE — Telephone Encounter (Signed)
Post procedure phone call.  Patient states she is doing fine.  

## 2020-05-15 ENCOUNTER — Ambulatory Visit: Payer: Medicare Other | Admitting: Certified Registered Nurse Anesthetist

## 2020-05-15 ENCOUNTER — Encounter
Admission: RE | Disposition: A | Discharge status: Home / Self Care | Payer: Self-pay | Source: Home / Self Care | Attending: Gastroenterology

## 2020-05-15 ENCOUNTER — Encounter: Payer: Self-pay | Admitting: Gastroenterology

## 2020-05-15 ENCOUNTER — Ambulatory Visit
Admission: RE | Admit: 2020-05-15 | Discharge: 2020-05-15 | Disposition: A | Discharge status: Home / Self Care | Payer: Medicare Other | Attending: Gastroenterology | Admitting: Gastroenterology

## 2020-05-15 ENCOUNTER — Other Ambulatory Visit: Payer: Self-pay

## 2020-05-15 DIAGNOSIS — Z853 Personal history of malignant neoplasm of breast: Secondary | ICD-10-CM | POA: Insufficient documentation

## 2020-05-15 DIAGNOSIS — Z79899 Other long term (current) drug therapy: Secondary | ICD-10-CM | POA: Diagnosis not present

## 2020-05-15 DIAGNOSIS — Z923 Personal history of irradiation: Secondary | ICD-10-CM | POA: Diagnosis not present

## 2020-05-15 DIAGNOSIS — E785 Hyperlipidemia, unspecified: Secondary | ICD-10-CM | POA: Insufficient documentation

## 2020-05-15 DIAGNOSIS — Z79890 Hormone replacement therapy (postmenopausal): Secondary | ICD-10-CM | POA: Insufficient documentation

## 2020-05-15 DIAGNOSIS — K635 Polyp of colon: Secondary | ICD-10-CM

## 2020-05-15 DIAGNOSIS — E039 Hypothyroidism, unspecified: Secondary | ICD-10-CM | POA: Insufficient documentation

## 2020-05-15 DIAGNOSIS — K644 Residual hemorrhoidal skin tags: Secondary | ICD-10-CM | POA: Insufficient documentation

## 2020-05-15 DIAGNOSIS — D123 Benign neoplasm of transverse colon: Secondary | ICD-10-CM | POA: Diagnosis not present

## 2020-05-15 DIAGNOSIS — Z1211 Encounter for screening for malignant neoplasm of colon: Secondary | ICD-10-CM | POA: Diagnosis not present

## 2020-05-15 DIAGNOSIS — K5730 Diverticulosis of large intestine without perforation or abscess without bleeding: Secondary | ICD-10-CM | POA: Insufficient documentation

## 2020-05-15 DIAGNOSIS — I10 Essential (primary) hypertension: Secondary | ICD-10-CM | POA: Diagnosis not present

## 2020-05-15 DIAGNOSIS — Z96619 Presence of unspecified artificial shoulder joint: Secondary | ICD-10-CM | POA: Diagnosis not present

## 2020-05-15 DIAGNOSIS — K5790 Diverticulosis of intestine, part unspecified, without perforation or abscess without bleeding: Secondary | ICD-10-CM | POA: Diagnosis not present

## 2020-05-15 HISTORY — PX: COLONOSCOPY WITH PROPOFOL: SHX5780

## 2020-05-15 SURGERY — COLONOSCOPY WITH PROPOFOL
Anesthesia: General

## 2020-05-15 MED ORDER — PROPOFOL 500 MG/50ML IV EMUL
INTRAVENOUS | Status: DC | PRN
  Administered 2020-05-15: 160 ug/kg/min via INTRAVENOUS

## 2020-05-15 MED ORDER — SODIUM CHLORIDE 0.9 % IV SOLN: INTRAVENOUS | Status: DC

## 2020-05-15 MED ORDER — MIDAZOLAM HCL 2 MG/2ML IJ SOLN
INTRAMUSCULAR | Status: AC
  Filled 2020-05-15: qty 2

## 2020-05-15 MED ORDER — PROPOFOL 10 MG/ML IV BOLUS
INTRAVENOUS | Status: DC | PRN
  Administered 2020-05-15: 30 mg via INTRAVENOUS
  Administered 2020-05-15: 50 mg via INTRAVENOUS
  Administered 2020-05-15 (×2): 20 mg via INTRAVENOUS

## 2020-05-15 MED ORDER — MIDAZOLAM HCL 2 MG/2ML IJ SOLN
INTRAMUSCULAR | Status: DC | PRN
  Administered 2020-05-15: 2 mg via INTRAVENOUS

## 2020-05-15 NOTE — Transfer of Care (Signed)
Immediate Anesthesia Transfer of Care Note  Patient: Maria Spencer  Procedure(s) Performed: COLONOSCOPY WITH PROPOFOL (N/A )  Patient Location: PACU  Anesthesia Type:General  Level of Consciousness: awake and alert   Airway & Oxygen Therapy: Patient Spontanous Breathing and Patient connected to nasal cannula oxygen  Post-op Assessment: Report given to RN and Post -op Vital signs reviewed and stable  Post vital signs: Reviewed and stable  Last Vitals:  Vitals Value Taken Time  BP 158/71 05/15/20 0912  Temp 36.1 C 05/15/20 0912  Pulse 75 05/15/20 0912  Resp 19 05/15/20 0912  SpO2 98 % 05/15/20 0912  Vitals shown include unvalidated device data.  Last Pain:  Vitals:   05/15/20 0912  TempSrc: Tympanic  PainSc: 0-No pain         Complications: No complications documented.

## 2020-05-15 NOTE — Anesthesia Preprocedure Evaluation (Signed)
Anesthesia Evaluation  Patient identified by MRN, date of birth, ID band Patient awake    Reviewed: Allergy & Precautions, H&P , NPO status , Patient's Chart, lab work & pertinent test results, reviewed documented beta blocker date and time   History of Anesthesia Complications Negative for: history of anesthetic complications  Airway Mallampati: II  TM Distance: >3 FB Neck ROM: full    Dental  (+) Dental Advidsory Given, Caps, Teeth Intact, Missing   Pulmonary neg pulmonary ROS,    Pulmonary exam normal breath sounds clear to auscultation       Cardiovascular Exercise Tolerance: Good hypertension, (-) angina(-) Past MI and (-) Cardiac Stents Normal cardiovascular exam(-) dysrhythmias (-) Valvular Problems/Murmurs Rhythm:regular Rate:Normal     Neuro/Psych negative neurological ROS  negative psych ROS   GI/Hepatic negative GI ROS, Neg liver ROS,   Endo/Other  neg diabetesHypothyroidism   Renal/GU Renal disease (kidney stones)  negative genitourinary   Musculoskeletal   Abdominal   Peds  Hematology negative hematology ROS (+)   Anesthesia Other Findings Past Medical History: No date: Arthritis     Comment:  neck, back, left knee;  2009: Breast cancer ([place])     Comment:  right lumpectomy s/p radiation  No date: Chicken pox No date: Chronic UTI     Comment:  established with urology  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  No date: Esophagitis     Comment:  egd 05/17/14 see report scanned into chart No date: Hormone disorder No date: Hyperlipidemia No date: Hypertension     Comment:  controlled well with medication;  2009: Personal history of radiation therapy     Comment:  F/U right breast cancer No date: Thyroid disease     Comment:  hypothyroidism    Reproductive/Obstetrics negative OB ROS                             Anesthesia Physical Anesthesia  Plan  ASA: II  Anesthesia Plan: General   Post-op Pain Management:    Induction: Intravenous  PONV Risk Score and Plan: 3 and Propofol infusion and TIVA  Airway Management Planned: Natural Airway and Nasal Cannula  Additional Equipment:   Intra-op Plan:   Post-operative Plan:   Informed Consent: I have reviewed the patients History and Physical, chart, labs and discussed the procedure including the risks, benefits and alternatives for the proposed anesthesia with the patient or authorized representative who has indicated his/her understanding and acceptance.     Dental Advisory Given  Plan Discussed with: Anesthesiologist, CRNA and Surgeon  Anesthesia Plan Comments:         Anesthesia Quick Evaluation

## 2020-05-15 NOTE — Anesthesia Postprocedure Evaluation (Signed)
Anesthesia Post Note  Patient: Maria Spencer  Procedure(s) Performed: COLONOSCOPY WITH PROPOFOL (N/A )  Patient location during evaluation: Endoscopy Anesthesia Type: General Level of consciousness: awake and alert Pain management: pain level controlled Vital Signs Assessment: post-procedure vital signs reviewed and stable Respiratory status: spontaneous breathing, nonlabored ventilation, respiratory function stable and patient connected to nasal cannula oxygen Cardiovascular status: blood pressure returned to baseline and stable Postop Assessment: no apparent nausea or vomiting Anesthetic complications: no   No complications documented.   Last Vitals:  Vitals:   05/15/20 0932 05/15/20 0942  BP: (!) 165/83 (!) 143/64  Pulse: 60 69  Resp: 14 20  Temp:    SpO2: 99% 99%    Last Pain:  Vitals:   05/15/20 0942  TempSrc:   PainSc: 0-No pain                 Martha Clan

## 2020-05-15 NOTE — H&P (Signed)
Maria Darby, MD [address]  [place], [place] [address]  Main: [phone]  Fax: [phone] Pager: [phone]  Primary Care Physician:  McLean-Scocuzza, Nino Glow, MD Primary Gastroenterologist:  Dr. Cephas Spencer  Pre-Procedure History & Physical: HPI:  Maria Spencer is a 74 y.o. female is here for an colonoscopy.   Past Medical History:  Diagnosis Date  . Arthritis    neck, back, left knee;   . Breast cancer [facility]) 2009   right lumpectomy s/p radiation   . Chicken pox   . Chronic UTI    established with urology   . Diverticular disease    -osis and -itis   . Esophagitis    egd 05/17/14 see report scanned into chart  . Hormone disorder   . Hyperlipidemia   . Hypertension    controlled well with medication;   . Personal history of radiation therapy 2009   F/U right breast cancer  . Thyroid disease    hypothyroidism     Past Surgical History:  Procedure Laterality Date  . BONE GRAFT HIP ILIAC CREST     + cage left hip 10/23/17   . BREAST BIOPSY Left 2009   clip,benign  . BREAST BIOPSY Right 2009   +  . BREAST LUMPECTOMY Right 2009   2009 lumpectomy   . CARDIAC CATHETERIZATION     No stents; [facility] doesn't recall facility +53yr ago  . EYE SURGERY     b/[place]acts [place] [facility] . INSERTION OF MESH N/A 05/29/2018   Procedure: INSERTION OF MESH;  Surgeon: MJohnathan Hausen MD;  Location: [facility];  Service: General;  Laterality: N/A;  . SHOULDER SURGERY     x2 surgeries both shoulders, right shoulder replacement last in 2012   . SPINE SURGERY  10/23/2017   spinal 09/2017 h/o scoliosis [facility] L4-S1 OLIF T10 to ililum fusion  . TONSILLECTOMY     age 74y.o.   . TOTAL SHOULDER REPLACEMENT    . VENTRAL HERNIA REPAIR N/A 05/29/2018   Procedure: LAPAROSCOPIC VENTRAL / [facility]  Surgeon: MJohnathan Hausen MD;  Location: [facility];  Service: General;  Laterality: N/A;    Prior to Admission medications   Medication Sig Start  Date End Date Taking? Authorizing Provider  atorvastatin (LIPITOR) 10 MG tablet Take 1 tablet (10 mg total) by mouth every other day. Note 10 mg no need to cut in 1/2 as before you have 20 mg taking 1/2 pill =10 mg 04/14/20  Yes McLean-Scocuzza, TNino Glow MD  Cholecalciferol (VITAMIN D3) 125 MCG (5000 UT) CAPS Take 1 capsule (5,000 Units total) by mouth daily with breakfast. Take along with calcium and magnesium. 05/11/20 11/07/20 Yes NMilinda Pointer MD  ergocalciferol (VITAMIN D2) 1.25 MG (50000 UT) capsule Take 1 capsule (50,000 Units total) by mouth 2 (two) times a week. X 6 weeks. 05/11/20 06/22/20 Yes NMilinda Pointer MD  fluticasone (FLONASE) 50 MCG/ACT nasal spray Place 2 sprays into both nostrils daily. Max b/l nostrils 09/10/19  Yes McLean-Scocuzza, TNino Glow MD  FORTEO 600 MCG/2.4ML SOPN  02/21/20  Yes [provider]  levothyroxine (SYNTHROID) 88 MCG tablet Take 1 tablet (88 mcg total) by mouth daily before breakfast. 30 minutes 07/21/19  Yes McLean-Scocuzza, TNino Glow MD  lisinopril (ZESTRIL) 40 MG tablet TAKE 1 TABLET DAILY 04/03/20  Yes End, CHarrell Gave MD  Multiple Vitamin (MULTI-VITAMINS) TABS Take 1 tablet by mouth daily.    Yes [provider]  zolpidem (Lorrin Mais  5 MG tablet Take 1 tablet (5 mg total) by mouth at bedtime as needed for sleep. 04/25/20  Yes McLean-Scocuzza, Nino Glow, MD  cephALEXin (KEFLEX) 250 MG capsule Take 250 mg by mouth daily. Patient not taking: Reported on 05/15/2020    [provider]  metoprolol succinate (TOPROL-XL) 25 MG 24 hr tablet Take 1 tablet (25 mg total) by mouth daily. Patient not taking: Reported on 05/15/2020 03/28/20   McLean-Scocuzza, Nino Glow, MD  nitroGLYCERIN (NITROSTAT) 0.4 MG SL tablet Place under the tongue. 09/17/19   [provider]  [place] KIT 17.5-3.13-1.6 GM/177ML SOLN [id number] Milliliter(s) By Mouth As Directed 04/28/20   [provider]    Allergies as of 02/23/2020 - Review Complete  02/07/2020  Allergen Reaction Noted  . Sulfa antibiotics Anaphylaxis, Hives, Itching, Shortness Of Breath, and Swelling 06/13/2015  . Nitrofuran derivatives Swelling 01/05/2020  . Amlodipine besylate Other (See Comments) 09/10/2019  . Nitrofurantoin Hives and Other (See Comments) 06/13/2011    Family History  Problem Relation Age of Onset  . Arthritis Mother   . Heart disease Mother 46  . Hyperlipidemia Mother   . Hypertension Mother   . Coronary artery disease Mother 59  . Arthritis Father   . Diabetes Father   . Cancer Father        colon  . AAA (abdominal aortic aneurysm) Father   . Arthritis Sister   . Hyperlipidemia Sister   . Hypertension Sister   . Cancer Brother        lung, smoker  . Mental retardation Brother   . Drug abuse Daughter        overdose in 2018   . Arthritis Sister   . Hyperlipidemia Sister   . Bladder Cancer Neg Hx   . Kidney cancer Neg Hx     Social History   Socioeconomic History  . Marital status: Widowed    Spouse name: Not on file  . Number of children: Not on file  . Years of education: Not on file  . Highest education level: Not on file  Occupational History  . Not on file  Tobacco Use  . Smoking status: Never Smoker  . Smokeless tobacco: Never Used  Vaping Use  . Vaping Use: Never used  Substance and Sexual Activity  . Alcohol use: Not Currently    Alcohol/week: 7.0 standard drinks    Types: 7 Glasses of wine per week  . Drug use: No  . Sexual activity: Not on file  Other Topics Concern  . Not on file  Social History Narrative   College grad   widowed husband died 07-02-2019    Wears seatbelt and safe in relationship    3 kids       [place]   Social Determinants of Health   Financial Resource Strain:   . Difficulty of Paying Living Expenses:   Food Insecurity:   . Worried About Charity fundraiser in the Last Year:   . Arboriculturist in the Last Year:   Transportation Needs:   . Film/video editor  (Medical):   Marland Kitchen Lack of Transportation (Non-Medical):   Physical Activity:   . Days of Exercise per Week:   . Minutes of Exercise per Session:   Stress:   . Feeling of Stress :   Social Connections: Unknown  . Frequency of Communication with Friends and Family: Not on file  . Frequency of Social Gatherings with Friends and Family: Not  on file  . Attends Religious Services: Not on file  . Active Member of Clubs or Organizations: Not on file  . Attends Archivist Meetings: Not on file  . Marital Status: Widowed  Intimate Partner Violence:   . Fear of Current or Ex-Partner:   . Emotionally Abused:   Marland Kitchen Physically Abused:   . Sexually Abused:     Review of Systems: See HPI, otherwise negative ROS  Physical Exam: BP (!) 161/78   Temp 97.9 F (36.6 C) (Temporal)   Resp 18   Ht '4\' 11"'  (1.499 m)   Wt 55.8 kg   SpO2 98%   BMI 24.84 kg/m  General:   Alert,  pleasant and cooperative in NAD Head:  Normocephalic and atraumatic. Neck:  Supple; no masses or thyromegaly. Lungs:  Clear throughout to auscultation.    Heart:  Regular rate and rhythm. Abdomen:  Soft, nontender and nondistended. Normal bowel sounds, without guarding, and without rebound.   Neurologic:  Alert and  oriented x4;  grossly normal neurologically.  Impression/Plan: Maria Spencer is here for an colonoscopy to be performed for colon cancer screening  Risks, benefits, limitations, and alternatives regarding  colonoscopy have been reviewed with the patient.  Questions have been answered.  All parties agreeable.   Sherri Sear, MD  05/15/2020, 8:28 AM

## 2020-05-15 NOTE — Op Note (Signed)
[facility] Gastroenterology Patient Name: Maria Spencer Procedure Date: 05/15/2020 8:44 AM MRN: [id number] Account #: [id number] Date of Birth: October 01, 1946 Admit Type: Outpatient Age: 74 Room: [facility] ENDO ROOM 1 Gender: Female Note Status: Finalized Procedure:             Colonoscopy Indications:           Screening for colorectal malignant neoplasm Providers:             Lin Landsman MD, MD Referring MD:          Nino Glow Mclean-Scocuzza MD, MD (Referring MD) Medicines:             Monitored Anesthesia Care Complications:         No immediate complications. Estimated blood loss: None. Procedure:             Pre-Anesthesia Assessment:                        - Prior to the procedure, a History and Physical was                         performed, and patient medications and allergies were                         reviewed. The patient is competent. The risks and                         benefits of the procedure and the sedation options and                         risks were discussed with the patient. All questions                         were answered and informed consent was obtained.                         Patient identification and proposed procedure were                         verified by the physician, the nurse, the                         anesthesiologist, the anesthetist and the technician                         in the pre-procedure area in the procedure room in the                         endoscopy suite. Mental Status Examination: alert and                         oriented. Airway Examination: normal oropharyngeal                         airway and neck mobility. Respiratory Examination:                         clear to auscultation. CV Examination: normal.  Prophylactic Antibiotics: The patient does not require                         prophylactic antibiotics. Prior Anticoagulants: The                         patient has  taken no previous anticoagulant or                         antiplatelet agents. ASA Grade Assessment: II - A                         patient with mild systemic disease. After reviewing                         the risks and benefits, the patient was deemed in                         satisfactory condition to undergo the procedure. The                         anesthesia plan was to use monitored anesthesia care                         (MAC). Immediately prior to administration of                         medications, the patient was re-assessed for adequacy                         to receive sedatives. The heart rate, respiratory                         rate, oxygen saturations, blood pressure, adequacy of                         pulmonary ventilation, and response to care were                         monitored throughout the procedure. The physical                         status of the patient was re-assessed after the                         procedure.                        After obtaining informed consent, the colonoscope was                         passed under direct vision. Throughout the procedure,                         the patient's blood pressure, pulse, and oxygen                         saturations were monitored continuously. The  Colonoscope was introduced through the anus and                         advanced to the the cecum, identified by appendiceal                         orifice and ileocecal valve. The colonoscopy was                         performed with moderate difficulty due to multiple                         diverticula in the colon. Successful completion of the                         procedure was aided by applying abdominal pressure.                         The patient tolerated the procedure fairly well. The                         quality of the bowel preparation was evaluated using                         the BBPS [facility] Bowel  Preparation Scale) with scores                         of: Right Colon = 3, Transverse Colon = 3 and Left                         Colon = 3 (entire mucosa seen well with no residual                         staining, small fragments of stool or opaque liquid).                         The total BBPS score equals 9. Findings:      Pediatric colonoscope was used      The perianal and digital rectal examinations were normal. Pertinent       negatives include normal sphincter tone and no palpable rectal lesions.      A 4 mm polyp was found in the transverse colon. The polyp was sessile.       The polyp was removed with a cold snare. Resection and retrieval were       complete.      Multiple diverticula were found in the sigmoid colon.      Non-bleeding external hemorrhoids were found during retroflexion. The       hemorrhoids were medium-sized. Impression:            - One 4 mm polyp in the transverse colon, removed with                         a cold snare. Resected and retrieved.                        - Diverticulosis in the sigmoid colon.                        -  Non-bleeding external hemorrhoids. Recommendation:        - Discharge patient to home (with escort).                        - High fiber diet today.                        - Continue present medications.                        - Await pathology results.                        - Repeat colonoscopy in 7-10 years for surveillance                         based on pathology results. Procedure Code(s):     --- Professional ---                        [phone], Colonoscopy, flexible; with removal of                         tumor(s), polyp(s), or other lesion(s) by snare                         technique Diagnosis Code(s):     --- Professional ---                        K63.5, Polyp of colon                        Z12.11, Encounter for screening for malignant neoplasm                         of colon                        K64.4, Residual  hemorrhoidal skin tags                        K57.30, Diverticulosis of large intestine without                         perforation or abscess without bleeding CPT copyright 2019 American Medical Association. All rights reserved. The codes documented in this report are preliminary and upon coder review may  be revised to meet current compliance requirements. Dr. Ulyess Mort Lin Landsman MD, MD 05/15/2020 9:10:15 AM This report has been signed electronically. Number of Addenda: 0 Note Initiated On: 05/15/2020 8:44 AM Scope Withdrawal Time: 0 hours 8 minutes 18 seconds  Total Procedure Duration: 0 hours 11 minutes 50 seconds  Estimated Blood Loss:  Estimated blood loss: none.      [facility]

## 2020-05-16 ENCOUNTER — Encounter: Payer: Self-pay | Admitting: Gastroenterology

## 2020-05-16 LAB — SURGICAL PATHOLOGY

## 2020-05-19 ENCOUNTER — Encounter: Payer: Self-pay | Admitting: Gastroenterology

## 2020-05-23 ENCOUNTER — Ambulatory Visit: Payer: Medicare Other | Admitting: Gastroenterology

## 2020-05-31 ENCOUNTER — Encounter: Payer: Self-pay | Admitting: Pain Medicine

## 2020-05-31 NOTE — Progress Notes (Signed)
Patient: Maria Spencer  Service Category: E/M  Provider: Gaspar Cola, MD  DOB: October 25, 1946  DOS: 06/01/2020  Location: Office  MRN: [id number]  Setting: Ambulatory outpatient  Referring Provider: McLean-Scocuzza, Maria Mackie *  Type: Established Patient  Specialty: Interventional Pain Management  PCP: Spencer, Maria Glow, MD  Location: Remote location  Delivery: TeleHealth     Virtual Encounter - Pain Management PROVIDER NOTE: Information contained herein reflects review and annotations entered in association with encounter. Interpretation of such information and data should be left to medically-trained personnel. Information provided to patient can be located elsewhere in the medical record under "Patient Instructions". Document created using STT-dictation technology, any transcriptional errors that may result from process are unintentional.    Contact & Pharmacy Preferred: [place]: [phone] (home) Mobile: [phone] (mobile) E-mail: [email or web address]  [id number]: [phone]: [phone] [facility] - [place] [place]Phone: [phone]: [phone]  Pre-screening  Maria Spencer offered "in-person" vs "virtual" encounter. She indicated preferring virtual for this encounter.   Reason COVID-19*  Social distancing based on CDC and AMA recommendations.   I contacted [name] 06/01/2020 via telephone.      I clearly identified myself as FGaspar Cola MD. I verified that I was speaking with the correct person using two identifiers (Name: [name] and date of birth: [birth year].  Consent I sought verbal advanced consent from [name] virtual visit interactions. I informed Maria Spencer of possible security and privacy concerns, risks, and limitations associated  with providing "not-in-person" medical evaluation and management services. I also informed Maria Spencer of the availability of "in-person" appointments. Finally, I informed her that there would be a charge for the virtual visit and that she could be  personally, fully or partially, financially responsible for it. Ms. BKoltzexpressed understanding and agreed to proceed.   Historic Elements   Ms. PKristilyn Spencer a 74y.o. year old, female patient evaluated today after her last contact with our practice on 05/12/2020. [name] has a past medical history of Arthritis, [place]), Chicken pox, Chronic UTI, Diverticular disease, Esophagitis, Hormone disorder, Hyperlipidemia, Hypertension, Personal history of radiation therapy (2009), and Thyroid disease. She also  has a past surgical history that includes Total shoulder replacement; Shoulder surgery; Tonsillectomy; Bone graft hip iliac crest; Spine surgery (10/23/2017); Ventral hernia repair (N/A, 05/29/2018); Insertion of mesh (N/A, 05/29/2018); Eye surgery; Cardiac catheterization; Breast lumpectomy (Right, 2009); Breast biopsy (Left, 2009); Breast biopsy (Right, 2009); and Colonoscopy with propofol (N/A, 05/15/2020). Ms. BFalzonhas a current medication list which includes the following prescription(s): atorvastatin, vitamin d3, ergocalciferol, fluticasone, forteo, levothyroxine, lisinopril, multi-vitamins, nitroglycerin, and zolpidem. She  reports that she has never smoked. She has never used smokeless tobacco. She reports previous alcohol use of about 7.0 standard drinks of alcohol per week. She reports that she does not use drugs. Ms. BColsonis allergic to sulfa antibiotics, nitrofuran derivatives, amlodipine besylate, and nitrofurantoin.   HPI  Today, she is being contacted for a post-procedure assessment. According to the patient she attained 100% relief of the pain for the duration of the local anesthetic during the diagnostic bilateral T9-T12 facet  medial branches. Please see procedure x-rays below. According to this results, the patient's mid back pain appears to have a significant component coming from those facet joints.  Today I have explained this to the patient and I have also explained to her that in view of those results, she would be a candidate for a second diagnostic thoracic facet block under fluoroscopic guidance and IV sedation. Should she again have the same thyroid results as those seen after the first one, then she may be a good candidate for radiofrequency ablation of those facet medial branches.  Interestingly enough, this bilateral thoracic facet block ended up also providing the patient with 100% relief of the pain that she was experiencing through the back of her leg. It has not helped the radicular component over both of her thighs suggesting the possibility of an upper lumbar radiculitis.  The patient indicated that she is pending to see [name] this next week for possible extension on her back fusion, and assuming that the bone density is adequate to sustain that type of surgery. I recommended to the patient to keep that visit and to share the information from this visit with [name] so that he can provide her with an update on his recommendations.  At this point, I am not scheduling her for a second diagnostic injection, but I have entered a PRN order for this procedure should she decide to have it done.  Post-Procedure Evaluation  Procedure: (05/11/2020) diagnostic bilateral thoracic facet medial branch block (T9, T10, T11, T12) #1 under fluoroscopic guidance, no sedation Pre-procedure pain level: 6/10 Post-procedure: 0/10 (100% relief)  Sedation: None.  Effectiveness during initial hour after procedure(Ultra-Short Term Relief): 100 %.  Local anesthetic used: Long-acting (4-6 hours) Effectiveness: Defined as any analgesic benefit obtained secondary to the administration of local anesthetics. This carries  significant diagnostic value as to the etiological location, or anatomical origin, of the pain. Duration of benefit is expected to coincide with the duration of the local anesthetic used.  Effectiveness during initial 4-6 hours after procedure(Short-Term Relief): 100 %.  Long-term benefit: Defined as any relief past the pharmacologic duration of the local anesthetics.  Effectiveness past the initial 6 hours after procedure(Long-Term Relief): 0 % (remained numb for a couple of days and then the pain returned at same intensity.  states the sciatic pain is gone in the right leg.).  Current benefits: Defined as benefit that persist at this time.   Analgesia:  Back to baseline when it comes to the mid back pain, but 100% ongoing relief of the lower extremity pain to the back of the legs. Function: Back to baseline ROM: Back to baseline  Pharmacotherapy Assessment  Analgesic: None Highest recorded MME/day: 75 mg/day MME/day: 0 mg/day   Monitoring: [place] PMP: PDMP reviewed during this encounter.       Pharmacotherapy: No side-effects or adverse reactions reported. Compliance: No problems identified. Effectiveness: Clinically acceptable. Plan: Refer to "POC".  UDS: No results found for: SUMMARY  Laboratory Chemistry Profile   Renal Lab Results  Component Value Date   BUN 24 (H) 05/08/2020   CREATININE 0.54 05/08/2020   BCR 28 (H) 09/10/2019   GFR 63.81 09/22/2018   GFRAA >60 05/08/2020   GFRNONAA >60 05/08/2020     Hepatic Lab Results  Component Value Date   AST 19 05/08/2020   ALT 15 05/08/2020   ALBUMIN 4.4 05/08/2020   ALKPHOS 97 05/08/2020   LIPASE 23 05/28/2018     Electrolytes Lab Results  Component Value Date   NA 138 05/08/2020   K 4.0 05/08/2020   CL 102 05/08/2020   CALCIUM 9.5 05/08/2020   MG 2.0  05/08/2020     Bone Lab Results  Component Value Date   VD25OH 19.57 (L) 05/08/2020     Inflammation (CRP: Acute Phase) (ESR: Chronic Phase) Lab Results    Component Value Date   CRP 0.6 05/08/2020   ESRSEDRATE 5 05/08/2020       Note: Above Lab results reviewed.   Imaging  DG PAIN CLINIC C-ARM 1-60 MIN NO REPORT Fluoro was used, but no Radiologist interpretation will be provided.  Please refer to "NOTES" tab for provider progress note.        Assessment  The primary encounter diagnosis was Thoracic facet syndrome (Bilateral). Diagnoses of Chronic thoracic back pain (1ry area of Pain) (Bilateral) (R>L) and Closed wedge compression fracture of T8 vertebra, sequela were also pertinent to this visit.  Plan of Care  Problem-specific:  No problem-specific Assessment & Plan notes found for this encounter.  Maria Spencer has a current medication list which includes the following long-term medication(s): atorvastatin, fluticasone, levothyroxine, lisinopril, and zolpidem.  Pharmacotherapy (Medications Ordered): No orders of the defined types were placed in this encounter.  Orders:  Orders Placed This Encounter  Procedures  . THORACIC FACET BLOCK    Standing Status:   Standing    Number of Occurrences:   1    Standing Expiration Date:   08/02/2020    Scheduling Instructions:     Thoracic Medial Branch Block (T9, T10, T11, T12)     Side: Bilateral     Sedation: Patient's choice.     Timeframe: Patient will call    Order Specific Question:   Where will this procedure be performed?    Answer:   [facility]   Follow-up plan:   Return if symptoms worsen or fail to improve, for PRN Procedure(s).      Interventional treatment options: Planned, scheduled, and/or pending:      Under consideration:   Diagnostic bilateral T9, T10, T11, and T12 MMB thoracic facet RFA    Therapeutic/palliative (PRN):   Diagnostic bilateral T9, T10, T11, and T12 MMB thoracic facet block #2     Recent Visits Date Type Provider Dept  05/11/20 Procedure visit Milinda Pointer, MD [facility]  05/08/20 Office Visit Milinda Pointer, MD [facility]  Showing recent visits within past 90 days and meeting all other requirements Today's Visits Date Type Provider Dept  06/01/20 Telemedicine Milinda Pointer, MD [facility]  Showing today's visits and meeting all other requirements Future Appointments No visits were found meeting these conditions. Showing future appointments within next 90 days and meeting all other requirements  I discussed the assessment and treatment plan with the patient. The patient was provided an opportunity to ask questions and all were answered. The patient agreed with the plan and demonstrated an understanding of the instructions.  Patient advised to call back or seek an in-person evaluation if the symptoms or condition worsens.  Duration of encounter: 15 minutes.  Note by: Maria Cola, MD Date: 06/01/2020; Time: 8:49 AM

## 2020-06-01 ENCOUNTER — Other Ambulatory Visit: Payer: Self-pay

## 2020-06-01 ENCOUNTER — Ambulatory Visit: Payer: Medicare Other | Attending: Pain Medicine | Admitting: Pain Medicine

## 2020-06-01 DIAGNOSIS — G8929 Other chronic pain: Secondary | ICD-10-CM | POA: Diagnosis not present

## 2020-06-01 DIAGNOSIS — M47894 Other spondylosis, thoracic region: Secondary | ICD-10-CM | POA: Diagnosis not present

## 2020-06-01 DIAGNOSIS — M546 Pain in thoracic spine: Secondary | ICD-10-CM

## 2020-06-01 DIAGNOSIS — S22060S Wedge compression fracture of T7-T8 vertebra, sequela: Secondary | ICD-10-CM

## 2020-06-01 NOTE — Patient Instructions (Signed)
____________________________________________________________________________________________  Preparing for Procedure with Sedation  Procedure appointments are limited to planned procedures: . No Prescription Refills. . No disability issues will be discussed. . No medication changes will be discussed.  Instructions: . Oral Intake: Do not eat or drink anything for at least 8 hours prior to your procedure. (Exception: Blood Pressure Medication. See below.) . Transportation: Unless otherwise stated by your physician, you may drive yourself after the procedure. . Blood Pressure Medicine: Do not forget to take your blood pressure medicine with a sip of water the morning of the procedure. If your Diastolic (lower reading)is above 100 mmHg, elective cases will be cancelled/rescheduled. . Blood thinners: These will need to be stopped for procedures. Notify our staff if you are taking any blood thinners. Depending on which one you take, there will be specific instructions on how and when to stop it. . Diabetics on insulin: Notify the staff so that you can be scheduled 1st case in the morning. If your diabetes requires high dose insulin, take only  of your normal insulin dose the morning of the procedure and notify the staff that you have done so. . Preventing infections: Shower with an antibacterial soap the morning of your procedure. . Build-up your immune system: Take 1000 mg of Vitamin C with every meal (3 times a day) the day prior to your procedure. . Antibiotics: Inform the staff if you have a condition or reason that requires you to take antibiotics before dental procedures. . Pregnancy: If you are pregnant, call and cancel the procedure. . Sickness: If you have a cold, fever, or any active infections, call and cancel the procedure. . Arrival: You must be in the facility at least 30 minutes prior to your scheduled procedure. . Children: Do not bring children with you. . Dress appropriately:  Bring dark clothing that you would not mind if they get stained. . Valuables: Do not bring any jewelry or valuables.  Reasons to call and reschedule or cancel your procedure: (Following these recommendations will minimize the risk of a serious complication.) . Surgeries: Avoid having procedures within 2 weeks of any surgery. (Avoid for 2 weeks before or after any surgery). . Flu Shots: Avoid having procedures within 2 weeks of a flu shots or . (Avoid for 2 weeks before or after immunizations). . Barium: Avoid having a procedure within 7-10 days after having had a radiological study involving the use of radiological contrast. (Myelograms, Barium swallow or enema study). . Heart attacks: Avoid any elective procedures or surgeries for the initial 6 months after a "Myocardial Infarction" (Heart Attack). . Blood thinners: It is imperative that you stop these medications before procedures. Let us know if you if you take any blood thinner.  . Infection: Avoid procedures during or within two weeks of an infection (including chest colds or gastrointestinal problems). Symptoms associated with infections include: Localized redness, fever, chills, night sweats or profuse sweating, burning sensation when voiding, cough, congestion, stuffiness, runny nose, sore throat, diarrhea, nausea, vomiting, cold or Flu symptoms, recent or current infections. It is specially important if the infection is over the area that we intend to treat. . Heart and lung problems: Symptoms that may suggest an active cardiopulmonary problem include: cough, chest pain, breathing difficulties or shortness of breath, dizziness, ankle swelling, uncontrolled high or unusually low blood pressure, and/or palpitations. If you are experiencing any of these symptoms, cancel your procedure and contact your primary care physician for an evaluation.  Remember:  Regular Business hours are:    Monday to Thursday 8:00 AM to 4:00 PM  Provider's  Schedule: Anetta Olvera, MD:  Procedure days: Tuesday and Thursday 7:30 AM to 4:00 PM  Bilal Lateef, MD:  Procedure days: Monday and Wednesday 7:30 AM to 4:00 PM ____________________________________________________________________________________________    

## 2020-06-02 DIAGNOSIS — M810 Age-related osteoporosis without current pathological fracture: Secondary | ICD-10-CM | POA: Diagnosis not present

## 2020-06-05 ENCOUNTER — Ambulatory Visit
Admission: RE | Admit: 2020-06-05 | Discharge: 2020-06-05 | Disposition: A | Discharge status: Home / Self Care | Payer: Medicare Other | Source: Ambulatory Visit | Attending: Internal Medicine | Admitting: Internal Medicine

## 2020-06-05 DIAGNOSIS — Z1231 Encounter for screening mammogram for malignant neoplasm of breast: Secondary | ICD-10-CM | POA: Insufficient documentation

## 2020-06-05 IMAGING — MG DIGITAL SCREENING BILAT W/ TOMO W/ CAD
8 series · 8 of 24 positions shown · non-contrast
Comparison: Previous exam(s).

CLINICAL DATA: Screening.

EXAM:
DIGITAL SCREENING BILATERAL MAMMOGRAM WITH TOMO AND CAD

[L MLO synth-2D]
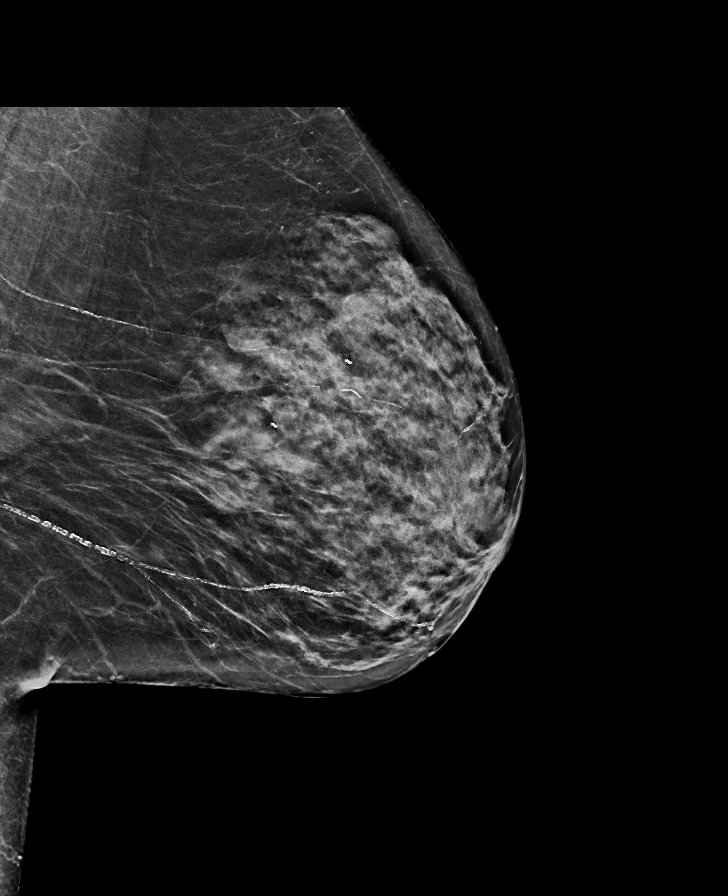

[L CC synth-2D]
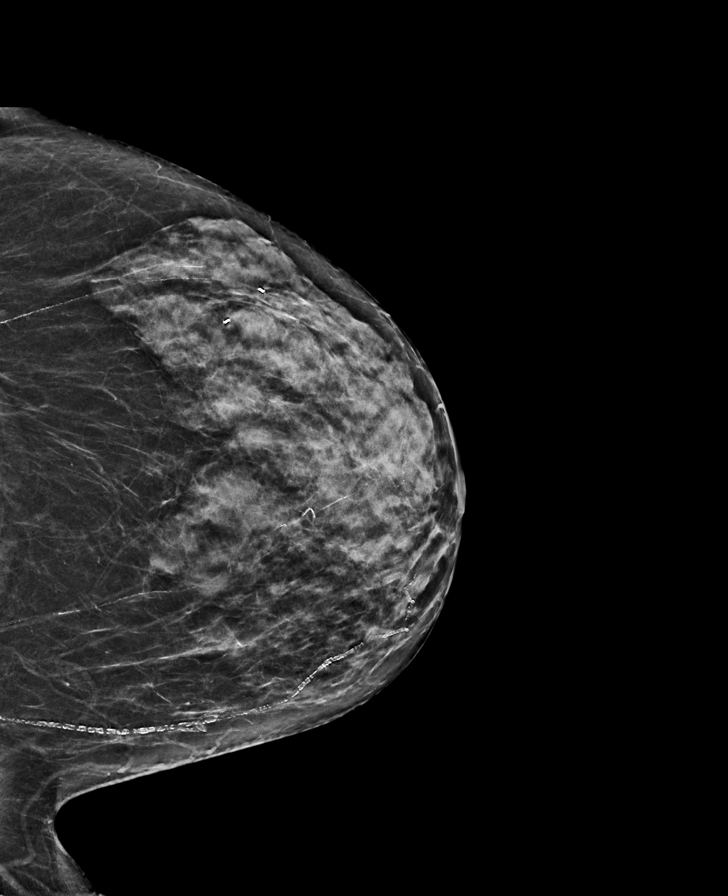

[R CC synth-2D]
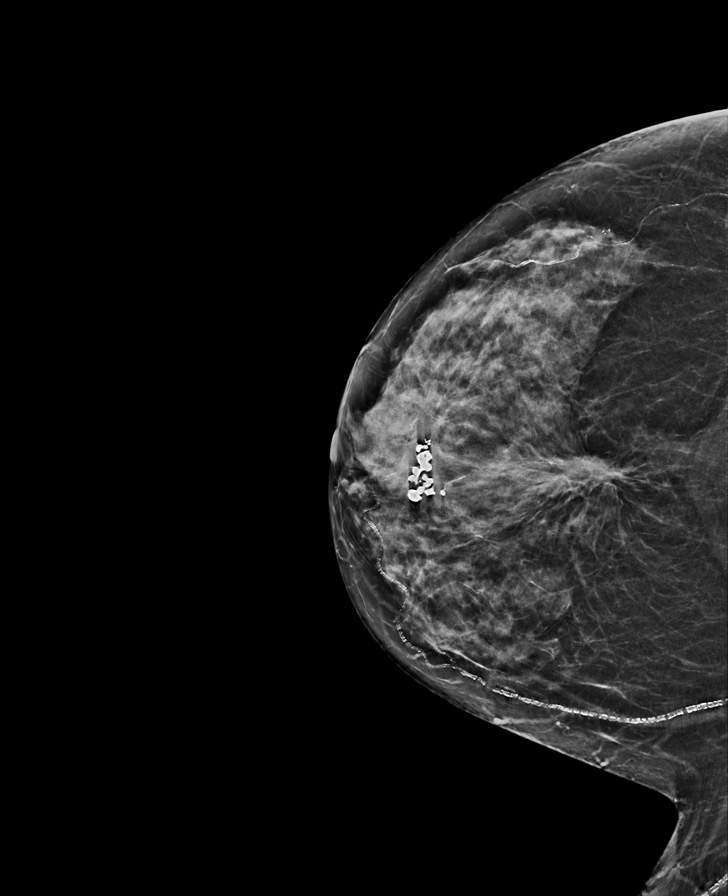

[R MLO synth-2D]
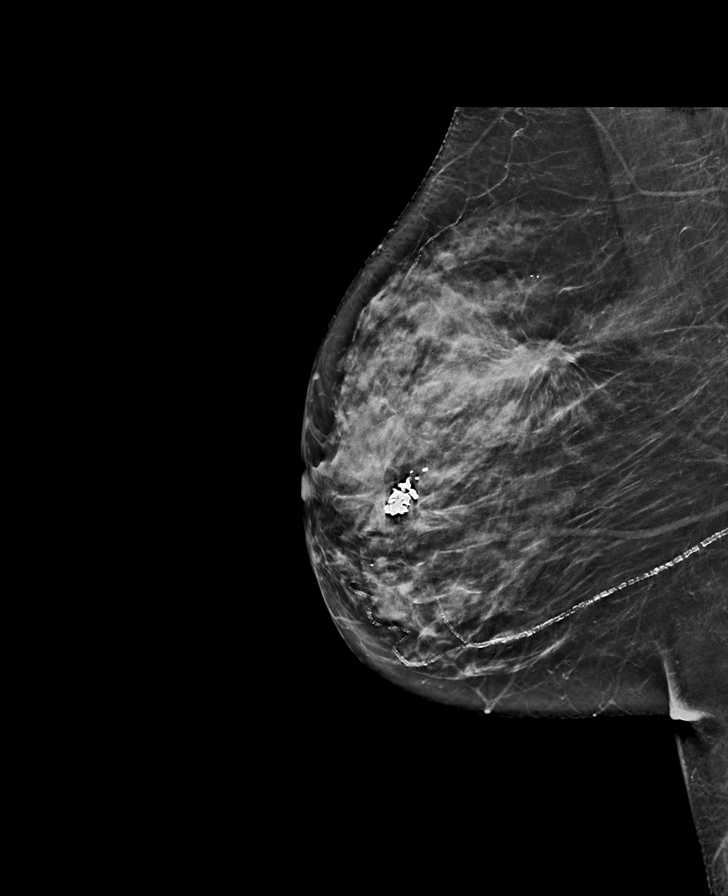

[L MLO tomo · tomo slice 31/60.0]
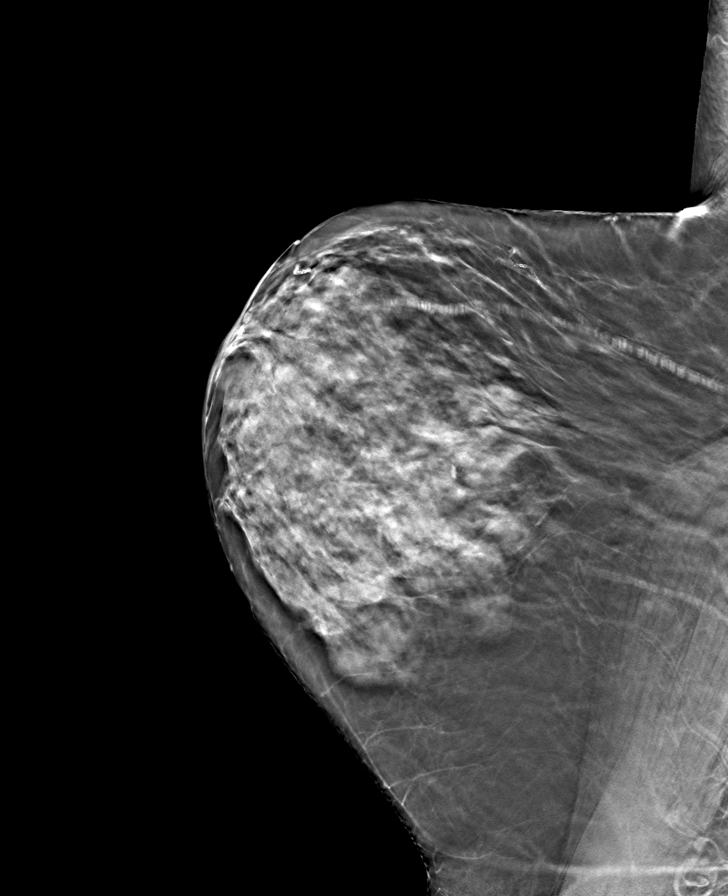

[R MLO tomo · tomo slice 31/60.0]
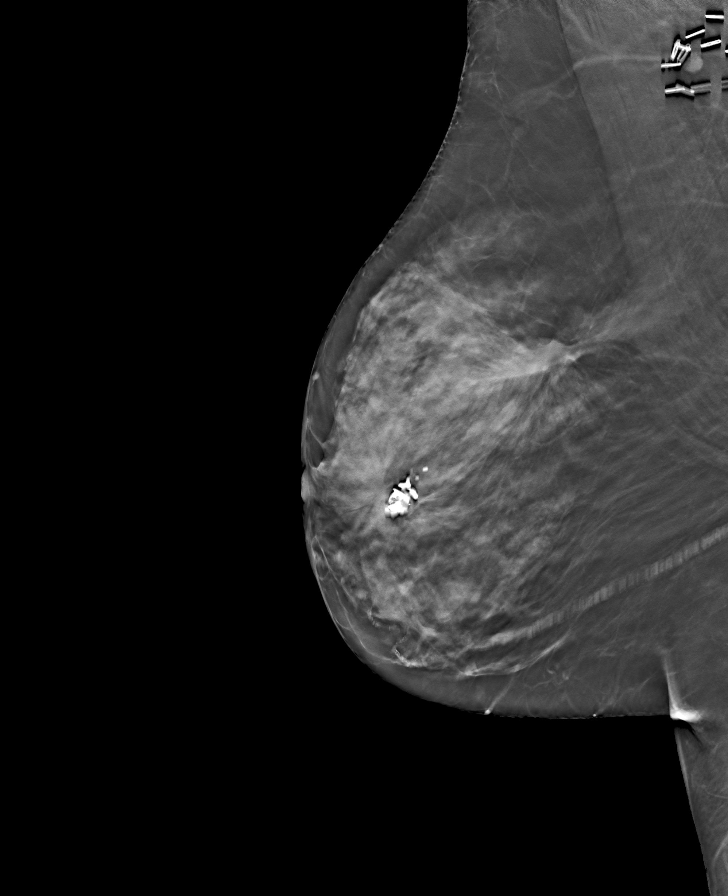

[R CC tomo · tomo slice 29/57.0]
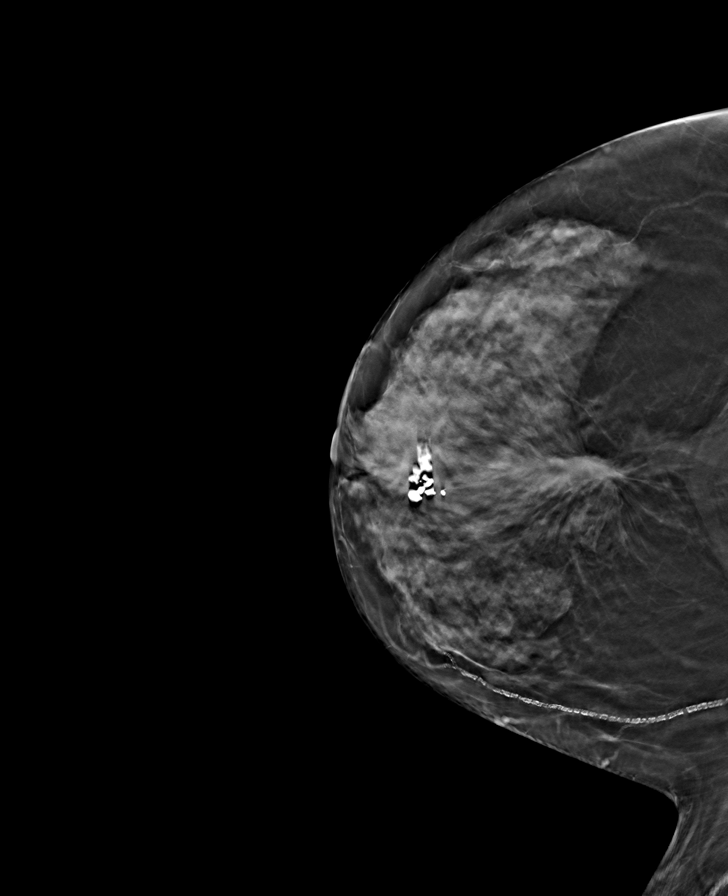

[L CC tomo · tomo slice 31/60.0]
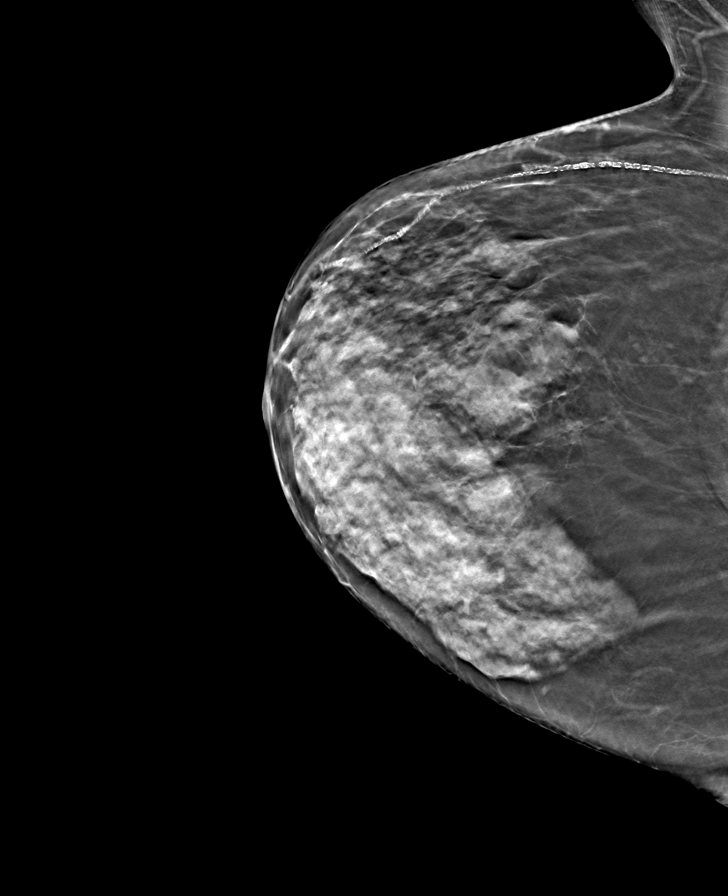

[8 of 24 positions shown; findings below may reference images not displayed]

ACR Breast Density Category d: The breast tissue is extremely dense,
which lowers the sensitivity of mammography
FINDINGS: There are no findings suspicious for malignancy. Images were
processed with CAD.
IMPRESSION: No mammographic evidence of malignancy. A result letter of this
screening mammogram will be mailed directly to the patient.

RECOMMENDATION:
Screening mammogram in one year. (Code:[5I])

BI-RADS CATEGORY  1: Negative.

## 2020-06-05 NOTE — Progress Notes (Signed)
PROVIDER NOTE: Information contained herein reflects review and annotations entered in association with encounter. Interpretation of such information and data should be left to medically-trained personnel. Information provided to patient can be located elsewhere in the medical record under "Patient Instructions". Document created using STT-dictation technology, any transcriptional errors that may result from process are unintentional.    Patient: Maria Spencer  Service Category: Procedure  Provider: Gaspar Cola, MD  DOB: 04/04/1946  DOS: 06/06/2020  Location: [facility]  MRN: [id number]  Setting: Ambulatory - outpatient  Referring Provider: McLean-Scocuzza, Olivia Mackie *  Type: Established Patient  Specialty: Interventional Pain Management  PCP: McLean-Scocuzza, Nino Glow, MD   Primary Reason for Visit: Interventional Pain Management Treatment. CC: Back Pain (bra line)  Procedure:          Anesthesia, Analgesia, Anxiolysis:  Type: Therapeutic Medial Branch Facet Block  #2  Region:Thoracic Level: T9, T10, T11, T12 Medial Branch Level(s) Laterality: Bilateral  Type: Local Anesthesia Indication(s): Analgesia         Route: Infiltration (Gilbert/IM) IV Access: Declined Sedation: Declined  Local Anesthetic: Lidocaine 1-2%  Position: Prone   Indications: 1. Spondylosis without myelopathy or radiculopathy, thoracic region   2. Thoracic facet syndrome (Bilateral)   3. Scoliosis of thoracolumbar spine, unspecified scoliosis type   4. 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 Closed wedge compression fracture of T8 vertebra, sequela    Pain Score: Pre-procedure: 7 /10 Post-procedure: 0-No pain/10   Pre-op Assessment:  Maria Spencer is a 74 y.o. (year old), female patient, seen today for interventional treatment. She  has a past surgical history that includes Total shoulder replacement; Shoulder surgery; Tonsillectomy; Bone graft hip iliac crest; Spine  surgery (10/23/2017); Ventral hernia repair (N/A, 05/29/2018); Insertion of mesh (N/A, 05/29/2018); Eye surgery; Cardiac catheterization; Breast lumpectomy (Right, 2009); Breast biopsy (Left, 2009); Breast biopsy (Right, 2009); and Colonoscopy with propofol (N/A, 05/15/2020). Maria Spencer has a current medication list which includes the following prescription(s): atorvastatin, vitamin d3, ergocalciferol, fluticasone, forteo, levothyroxine, lisinopril, multi-vitamins, nitroglycerin, and zolpidem. Her primarily concern today is the Back Pain (bra line)  Initial Vital Signs:  Pulse/HCG Rate: 87ECG Heart Rate: 79 Temp: 98 F (36.7 C) Resp: 18 BP: (!) 146/77 SpO2: 99 %  BMI: Estimated body mass index is 25.45 kg/m as calculated from the following:   Height as of this encounter: 4\' 11"  (1.499 m).   Weight as of this encounter: 126 lb (57.2 kg).  Risk Assessment: Allergies: Reviewed. She is allergic to sulfa antibiotics, nitrofuran derivatives, amlodipine besylate, and nitrofurantoin.  Allergy Precautions: None required Coagulopathies: Reviewed. None identified.  Blood-thinner therapy: None at this time Active Infection(s): Reviewed. None identified. [name] is afebrile  Site Confirmation: Maria Spencer was asked to confirm the procedure and laterality before marking the site Procedure checklist: Completed Consent: Before the procedure and under the influence of no sedative(s), amnesic(s), or anxiolytics, the patient was informed of the treatment options, risks and possible complications. To fulfill our ethical and legal obligations, as recommended by the American Medical Association's Code of Ethics, I have informed the patient of my clinical impression; the nature and purpose of the treatment or procedure; the risks, benefits, and possible complications of the intervention; the alternatives, including doing nothing; the risk(s) and benefit(s) of the alternative treatment(s) or procedure(s); and the risk(s)  and benefit(s) of doing nothing. The patient was provided information about the general risks and possible complications associated with the procedure. These may  include, but are not limited to: failure to achieve desired goals, infection, bleeding, organ or nerve damage, allergic reactions, paralysis, and death. In addition, the patient was informed of those risks and complications associated to Spine-related procedures, such as failure to decrease pain; infection (i.e.: Meningitis, epidural or intraspinal abscess); bleeding (i.e.: epidural hematoma, subarachnoid hemorrhage, or any other type of intraspinal or peri-dural bleeding); organ or nerve damage (i.e.: Any type of peripheral nerve, nerve root, or spinal cord injury) with subsequent damage to sensory, motor, and/or autonomic systems, resulting in permanent pain, numbness, and/or weakness of one or several areas of the body; allergic reactions; (i.e.: anaphylactic reaction); and/or death. Furthermore, the patient was informed of those risks and complications associated with the medications. These include, but are not limited to: allergic reactions (i.e.: anaphylactic or anaphylactoid reaction(s)); adrenal axis suppression; blood sugar elevation that in diabetics may result in ketoacidosis or comma; water retention that in patients with history of congestive heart failure may result in shortness of breath, pulmonary edema, and decompensation with resultant heart failure; weight gain; swelling or edema; medication-induced neural toxicity; particulate matter embolism and blood vessel occlusion with resultant organ, and/or nervous system infarction; and/or aseptic necrosis of one or more joints. Finally, the patient was informed that Medicine is not an exact science; therefore, there is also the possibility of unforeseen or unpredictable risks and/or possible complications that may result in a catastrophic outcome. The patient indicated having understood very  clearly. We have given the patient no guarantees and we have made no promises. Enough time was given to the patient to ask questions, all of which were answered to the patient's satisfaction. Ms. Toutant has indicated that she wanted to continue with the procedure. Attestation: I, the ordering provider, attest that I have discussed with the patient the benefits, risks, side-effects, alternatives, likelihood of achieving goals, and potential problems during recovery for the procedure that I have provided informed consent. Date  Time: 06/06/2020 10:03 AM  Pre-Procedure Preparation:  Monitoring: As per clinic protocol. Respiration, ETCO2, SpO2, BP, heart rate and rhythm monitor placed and checked for adequate function Safety Precautions: Patient was assessed for positional comfort and pressure points before starting the procedure. Time-out: I initiated and conducted the "Time-out" before starting the procedure, as per protocol. The patient was asked to participate by confirming the accuracy of the "Time Out" information. Verification of the correct person, site, and procedure were performed and confirmed by me, the nursing staff, and the patient. "Time-out" conducted as per Joint Commission's Universal Protocol (UP.01.01.01). Time: 1101  Description of Procedure:          Target Area: The target area is a superior and lateral portion of the thoracic transverse processes more distal to the joint itself then and the lumbar region. Approach: Paraspinal approach. Area Prepped: Entire Posterior Thoracic Region DuraPrep (Iodine Povacrylex [0.7% available iodine] and Isopropyl Alcohol, 74% w/w) Safety Precautions: Aspiration looking for blood return was conducted prior to all injections. At no point did we inject any substances, as a needle was being advanced. No attempts were made at seeking any paresthesias. Safe injection practices and needle disposal techniques used. Medications properly checked for  expiration dates. SDV (single dose vial) medications used. Description of the Procedure: Protocol guidelines were followed. The patient was placed in position over the fluoroscopy table. The target area was identified and the area prepped in the usual manner. Skin & deeper tissues infiltrated with local anesthetic. Appropriate amount of time allowed to pass for local  anesthetics to take effect. The procedure needles were then advanced to the target area. Proper needle placement secured. Negative aspiration confirmed. Solution injected in intermittent fashion, asking for systemic symptoms every 0.5cc of injectate. The needles were then removed and the area cleansed, making sure to leave some of the prepping solution back to take advantage of its long term bactericidal properties.       Vitals:   06/06/20 1104 06/06/20 1109 06/06/20 1113 06/06/20 1114  BP: (!) 168/97 (!) 168/95 (!) 167/91 (!) 159/90  Pulse:      Resp: 12 15 17 14   Temp:      SpO2: 99% 98% 98% 98%  Weight:      Height:        Start Time: 1101 hrs. End Time: 1114 hrs. Imaging Guidance (Spinal):          Type of Imaging Technique: Fluoroscopy Guidance (Spinal) Indication(s): Assistance in needle guidance and placement for procedures requiring needle placement in or near specific anatomical locations not easily accessible without such assistance. Exposure Time: Please see nurses notes. Contrast: None used. Fluoroscopic Guidance: I was personally present during the use of fluoroscopy. "Tunnel Vision Technique" used to obtain the best possible view of the target area. Parallax error corrected before commencing the procedure. "Direction-depth-direction" technique used to introduce the needle under continuous pulsed fluoroscopy. Once target was reached, antero-posterior, oblique, and lateral fluoroscopic projection used confirm needle placement in all planes. Images permanently stored in EMR. Interpretation: No contrast injected. I  personally interpreted the imaging intraoperatively. Adequate needle placement confirmed in multiple planes. Permanent images saved into the patient's record.  Antibiotic Prophylaxis:   Anti-infectives (From admission, onward)   None     Indication(s): None identified  Post-operative Assessment:  Post-procedure Vital Signs:  Pulse/HCG Rate: 8777 Temp: 98 F (36.7 C) Resp: 14 BP: (!) 159/90 SpO2: 98 %  EBL: None  Complications: No immediate post-treatment complications observed by team, or reported by patient.  Note: The patient tolerated the entire procedure well. A repeat set of vitals were taken after the procedure and the patient was kept under observation following institutional policy, for this type of procedure. Post-procedural neurological assessment was performed, showing return to baseline, prior to discharge. The patient was provided with post-procedure discharge instructions, including a section on how to identify potential problems. Should any problems arise concerning this procedure, the patient was given instructions to immediately contact us, at any time, without hesitation. In any case, we plan to contact the patient by telephone for a follow-up status report regarding this interventional procedure.  Comments:  No additional relevant information.  Plan of Care  Orders:  Orders Placed This Encounter  Procedures  . THORACIC FACET BLOCK    Scheduling Instructions:     Thoracic Medial Branch Block     Side: Bilateral     Sedation: Patient's choice.     Timeframe: Today    Order Specific Question:   Where will this procedure be performed?    Answer:   [facility]  . DG [facility] C-ARM 1-60 MIN NO REPORT    Intraoperative interpretation by procedural physician at [place].    Standing Status:   Standing    Number of Occurrences:   1    Order Specific Question:   Reason for exam:    Answer:   Assistance in needle guidance and placement for  procedures requiring needle placement in or near specific anatomical locations not easily accessible without such  assistance.  . Informed Consent Details: Physician/Practitioner Attestation; Transcribe to consent form and obtain patient signature    Provider Attestation: I, [place] Dossie Arbour, MD, (Pain Management Specialist), the physician/practitioner, attest that I have discussed with the patient the benefits, risks, side effects, alternatives, likelihood of achieving goals and potential problems during recovery for the procedure that I have provided informed consent.    Scheduling Instructions:     Nursing Order: Transcribe to consent form and obtain patient signature.     Procedure: Thoracic medial branch facet block     Indication/reason: Thoracic upper back pain associated with thoracic facet syndrome (E09.233) and thoracic spondylosis without myelopathy or radiculopathy (A07.622)     Note: Always confirm laterality of pain with Ms. Sanks, before procedure.  . Provide equipment / supplies at bedside    Equipment required: Single use, disposable, "Block Tray"    Standing Status:   Standing    Number of Occurrences:   1    Order Specific Question:   Specify    Answer:   Block Tray   Chronic Opioid Analgesic:  None Highest recorded MME/day: 75 mg/day MME/day: 0 mg/day   Medications ordered for procedure: Meds ordered this encounter  Medications  . lidocaine (XYLOCAINE) 2 % (with pres) injection 400 mg  . DISCONTD: lactated ringers infusion 1,000 mL  . DISCONTD: midazolam (VERSED) 5 MG/5ML injection 1-2 mg    Make sure Flumazenil is available in the pyxis when using this medication. If oversedation occurs, administer 0.2 mg IV over 15 sec. If after 45 sec no response, administer 0.2 mg again over 1 min; may repeat at 1 min intervals; not to exceed 4 doses (1 mg)  . DISCONTD: fentaNYL (SUBLIMAZE) injection 25-50 mcg    Make sure Narcan is available in the pyxis when using this  medication. In the event of respiratory depression (RR< 8/min): Titrate NARCAN (naloxone) in increments of 0.1 to 0.2 mg IV at 2-3 minute intervals, until desired degree of reversal.  . ropivacaine (PF) 2 mg/mL (0.2%) (NAROPIN) injection 18 mL  . dexamethasone (DECADRON) injection 20 mg   Medications administered: We administered lidocaine, ropivacaine (PF) 2 mg/mL (0.2%), and dexamethasone.  See the medical record for exact dosing, route, and time of administration.  Follow-up plan:   Return in about 2 weeks (around 06/20/2020) for VV(15-min), (PP), pm on procedure day.       Interventional treatment options: Planned, scheduled, and/or pending:      Under consideration:   Diagnostic bilateral T9, T10, T11, and T12 MMB thoracic facet RFA (NOT A GOOD CANDIDATE DUE TO HARDWARE)    Therapeutic/palliative (PRN):   Diagnostic bilateral T9, T10, T11, and T12 MMB thoracic facet block #3     Recent Visits Date Type Provider Dept  06/01/20 Telemedicine Milinda Pointer, MD [facility]  05/11/20 Procedure visit Milinda Pointer, MD [facility]  05/08/20 Office Visit Milinda Pointer, MD [facility]  Showing recent visits within past 90 days and meeting all other requirements Today's Visits Date Type Provider Dept  06/06/20 Procedure visit Milinda Pointer, MD [facility]  Showing today's visits and meeting all other requirements Future Appointments Date Type Provider Dept  06/22/20 Appointment Milinda Pointer, MD [facility]  Showing future appointments within next 90 days and meeting all other requirements  Disposition: Discharge home  Discharge (Date  Time): 06/06/2020; 1125 hrs.   Primary Care Physician: McLean-Scocuzza, Nino Glow, MD Location: [facility] Note by: Kathlen Brunswick  Dossie Arbour, MD Date: 06/06/2020; Time: 1:06 PM  Disclaimer:  Medicine is not an Chief Strategy Officer. The only guarantee in  medicine is that nothing is guaranteed. It is important to note that the decision to proceed with this intervention was based on the information collected from the patient. The Data and conclusions were drawn from the patient's questionnaire, the interview, and the physical examination. Because the information was provided in large part by the patient, it cannot be guaranteed that it has not been purposely or unconsciously manipulated. Every effort has been made to obtain as much relevant data as possible for this evaluation. It is important to note that the conclusions that lead to this procedure are derived in large part from the available data. Always take into account that the treatment will also be dependent on availability of resources and existing treatment guidelines, considered by other Pain Management Practitioners as being common knowledge and practice, at the time of the intervention. For Medico-Legal purposes, it is also important to point out that variation in procedural techniques and pharmacological choices are the acceptable norm. The indications, contraindications, technique, and results of the above procedure should only be interpreted and judged by a Board-Certified Interventional Pain Specialist with extensive familiarity and expertise in the same exact procedure and technique.

## 2020-06-06 ENCOUNTER — Other Ambulatory Visit: Payer: Self-pay

## 2020-06-06 ENCOUNTER — Ambulatory Visit: Payer: Medicare Other | Admitting: Gastroenterology

## 2020-06-06 ENCOUNTER — Ambulatory Visit (HOSPITAL_BASED_OUTPATIENT_CLINIC_OR_DEPARTMENT_OTHER): Payer: Medicare Other | Admitting: Pain Medicine

## 2020-06-06 ENCOUNTER — Encounter: Payer: Self-pay | Admitting: Pain Medicine

## 2020-06-06 ENCOUNTER — Ambulatory Visit
Admission: RE | Admit: 2020-06-06 | Discharge: 2020-06-06 | Disposition: A | Discharge status: Home / Self Care | Payer: Medicare Other | Source: Ambulatory Visit | Attending: Pain Medicine | Admitting: Pain Medicine

## 2020-06-06 VITALS — BP 159/90 | HR 87 | Temp 98.0°F | Resp 14 | Ht 59.0 in | Wt 126.0 lb

## 2020-06-06 DIAGNOSIS — M47814 Spondylosis without myelopathy or radiculopathy, thoracic region: Secondary | ICD-10-CM | POA: Diagnosis not present

## 2020-06-06 DIAGNOSIS — G8929 Other chronic pain: Secondary | ICD-10-CM | POA: Diagnosis not present

## 2020-06-06 DIAGNOSIS — M47894 Other spondylosis, thoracic region: Secondary | ICD-10-CM | POA: Insufficient documentation

## 2020-06-06 DIAGNOSIS — M546 Pain in thoracic spine: Secondary | ICD-10-CM | POA: Diagnosis not present

## 2020-06-06 DIAGNOSIS — S22060S Wedge compression fracture of T7-T8 vertebra, sequela: Secondary | ICD-10-CM | POA: Insufficient documentation

## 2020-06-06 DIAGNOSIS — M419 Scoliosis, unspecified: Secondary | ICD-10-CM

## 2020-06-06 DIAGNOSIS — X58XXXS Exposure to other specified factors, sequela: Secondary | ICD-10-CM | POA: Diagnosis not present

## 2020-06-06 MED ORDER — LIDOCAINE HCL 2 % IJ SOLN
20.0000 mL | Freq: Once | INTRAMUSCULAR | Status: AC
  Administered 2020-06-06: 400 mg
  Filled 2020-06-06: qty 20

## 2020-06-06 MED ORDER — FENTANYL CITRATE (PF) 100 MCG/2ML IJ SOLN: 25.0000 ug | INTRAMUSCULAR | Status: DC | PRN

## 2020-06-06 MED ORDER — MIDAZOLAM HCL 5 MG/5ML IJ SOLN: 1.0000 mg | INTRAMUSCULAR | Status: DC | PRN

## 2020-06-06 MED ORDER — DEXAMETHASONE SODIUM PHOSPHATE 10 MG/ML IJ SOLN
20.0000 mg | Freq: Once | INTRAMUSCULAR | Status: AC
  Administered 2020-06-06: 20 mg
  Filled 2020-06-06: qty 2

## 2020-06-06 MED ORDER — ROPIVACAINE HCL 2 MG/ML IJ SOLN
18.0000 mL | Freq: Once | INTRAMUSCULAR | Status: AC
  Administered 2020-06-06: 18 mL via PERINEURAL
  Filled 2020-06-06: qty 20

## 2020-06-06 MED ORDER — LACTATED RINGERS IV SOLN: 1000.0000 mL | Freq: Once | INTRAVENOUS | Status: DC

## 2020-06-06 NOTE — Progress Notes (Signed)
Safety precautions to be maintained throughout the outpatient stay will include: orient to surroundings, keep bed in low position, maintain call bell within reach at all times, provide assistance with transfer out of bed and ambulation.  

## 2020-06-06 NOTE — Patient Instructions (Signed)

## 2020-06-07 ENCOUNTER — Telehealth: Payer: Self-pay | Admitting: *Deleted

## 2020-06-07 NOTE — Telephone Encounter (Signed)
No problems post procedure. 

## 2020-06-08 DIAGNOSIS — M419 Scoliosis, unspecified: Secondary | ICD-10-CM | POA: Diagnosis not present

## 2020-06-08 DIAGNOSIS — M5134 Other intervertebral disc degeneration, thoracic region: Secondary | ICD-10-CM | POA: Diagnosis not present

## 2020-06-14 ENCOUNTER — Other Ambulatory Visit: Payer: Self-pay | Admitting: Neurosurgery

## 2020-06-14 ENCOUNTER — Other Ambulatory Visit (HOSPITAL_COMMUNITY): Payer: Self-pay | Admitting: Neurosurgery

## 2020-06-14 DIAGNOSIS — M519 Unspecified thoracic, thoracolumbar and lumbosacral intervertebral disc disorder: Secondary | ICD-10-CM

## 2020-06-21 ENCOUNTER — Encounter: Payer: Self-pay | Admitting: Pain Medicine

## 2020-06-22 ENCOUNTER — Ambulatory Visit (HOSPITAL_BASED_OUTPATIENT_CLINIC_OR_DEPARTMENT_OTHER): Payer: Medicare Other | Admitting: Pain Medicine

## 2020-06-22 ENCOUNTER — Other Ambulatory Visit: Payer: Self-pay

## 2020-06-22 ENCOUNTER — Ambulatory Visit
Admission: RE | Admit: 2020-06-22 | Discharge: 2020-06-22 | Disposition: A | Discharge status: Home / Self Care | Payer: Medicare Other | Source: Ambulatory Visit | Attending: Neurosurgery | Admitting: Neurosurgery

## 2020-06-22 DIAGNOSIS — M25562 Pain in left knee: Secondary | ICD-10-CM | POA: Diagnosis not present

## 2020-06-22 DIAGNOSIS — M5124 Other intervertebral disc displacement, thoracic region: Secondary | ICD-10-CM | POA: Diagnosis not present

## 2020-06-22 DIAGNOSIS — M546 Pain in thoracic spine: Secondary | ICD-10-CM | POA: Diagnosis not present

## 2020-06-22 DIAGNOSIS — M51369 Other intervertebral disc degeneration, lumbar region without mention of lumbar back pain or lower extremity pain: Secondary | ICD-10-CM

## 2020-06-22 DIAGNOSIS — M79604 Pain in right leg: Secondary | ICD-10-CM | POA: Diagnosis not present

## 2020-06-22 DIAGNOSIS — M519 Unspecified thoracic, thoracolumbar and lumbosacral intervertebral disc disorder: Secondary | ICD-10-CM | POA: Insufficient documentation

## 2020-06-22 DIAGNOSIS — M25552 Pain in left hip: Secondary | ICD-10-CM

## 2020-06-22 DIAGNOSIS — Z981 Arthrodesis status: Secondary | ICD-10-CM | POA: Diagnosis not present

## 2020-06-22 DIAGNOSIS — M4325 Fusion of spine, thoracolumbar region: Secondary | ICD-10-CM | POA: Diagnosis not present

## 2020-06-22 DIAGNOSIS — M4324 Fusion of spine, thoracic region: Secondary | ICD-10-CM | POA: Diagnosis not present

## 2020-06-22 DIAGNOSIS — M4854XS Collapsed vertebra, not elsewhere classified, thoracic region, sequela of fracture: Secondary | ICD-10-CM | POA: Diagnosis not present

## 2020-06-22 DIAGNOSIS — M47894 Other spondylosis, thoracic region: Secondary | ICD-10-CM | POA: Diagnosis not present

## 2020-06-22 DIAGNOSIS — M5030 Other cervical disc degeneration, unspecified cervical region: Secondary | ICD-10-CM | POA: Diagnosis not present

## 2020-06-22 DIAGNOSIS — M5136 Other intervertebral disc degeneration, lumbar region: Secondary | ICD-10-CM

## 2020-06-22 DIAGNOSIS — M25551 Pain in right hip: Secondary | ICD-10-CM | POA: Diagnosis not present

## 2020-06-22 DIAGNOSIS — M5134 Other intervertebral disc degeneration, thoracic region: Secondary | ICD-10-CM | POA: Diagnosis not present

## 2020-06-22 DIAGNOSIS — C50919 Malignant neoplasm of unspecified site of unspecified female breast: Secondary | ICD-10-CM | POA: Insufficient documentation

## 2020-06-22 DIAGNOSIS — G8929 Other chronic pain: Secondary | ICD-10-CM

## 2020-06-22 IMAGING — CT CT T SPINE W/O CM
3 of 4 series · 10 of 33 positions shown, 12 images · non-contrast
Comparison: [DATE] myelogram

CLINICAL DATA: Mid back pain

EXAM:
CT THORACIC SPINE WITHOUT CONTRAST
TECHNIQUE: Multidetector CT images of the thoracic were obtained using the
standard protocol without intravenous contrast.

[Series 3: (person_name) 2.00 ax · axial · 0.27mm/px · z∈[-1044,-960]mm · 2 of 128 slices shown, 3 images]
[im 43/128  soft-tissue]
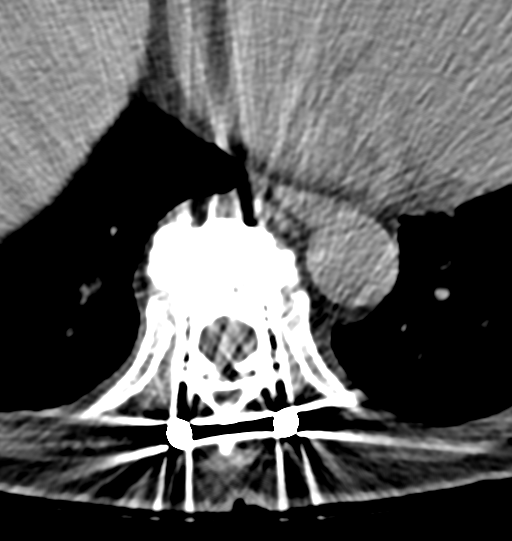
[im 43/128  bone]
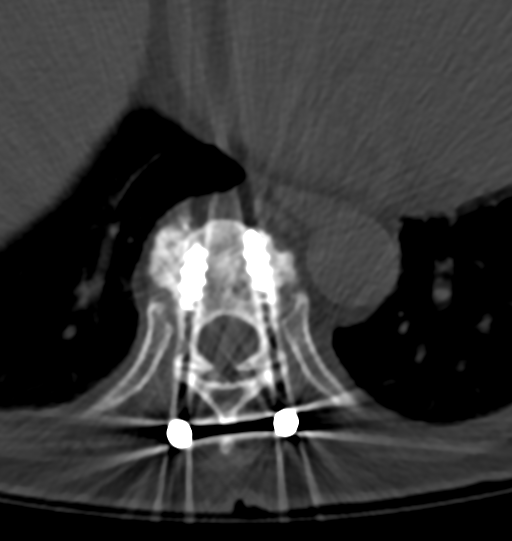
[im 85/128  bone]
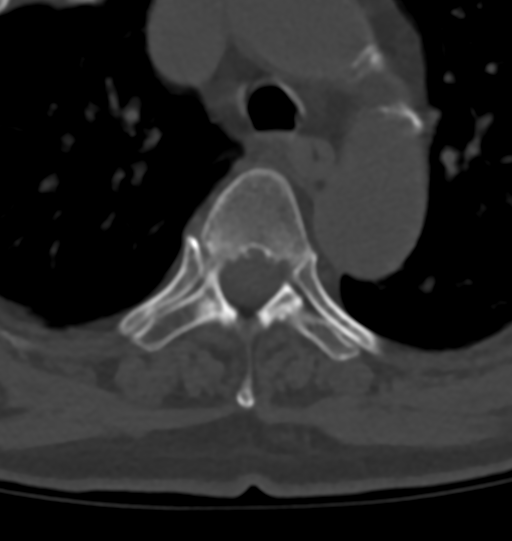

[Series 7: t-spine 2.00 sag · sagittal · 0.28mm/px · 5 of 68 slices shown, 6 images]
[im 23/68  bone]
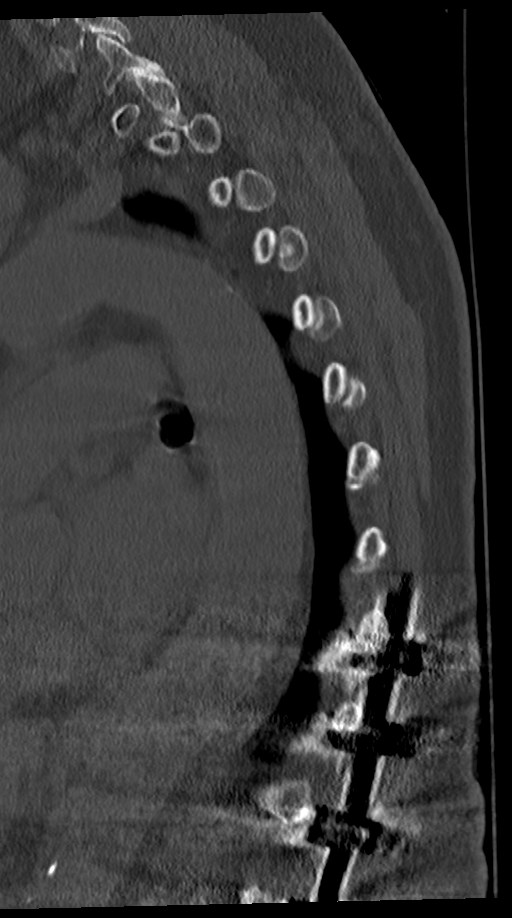
[im 28/68  bone]
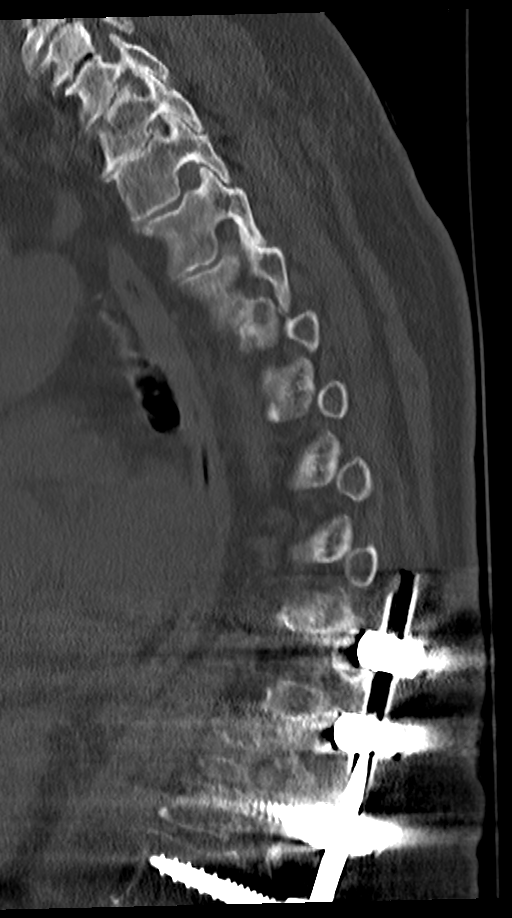
[im 34/68  soft-tissue]
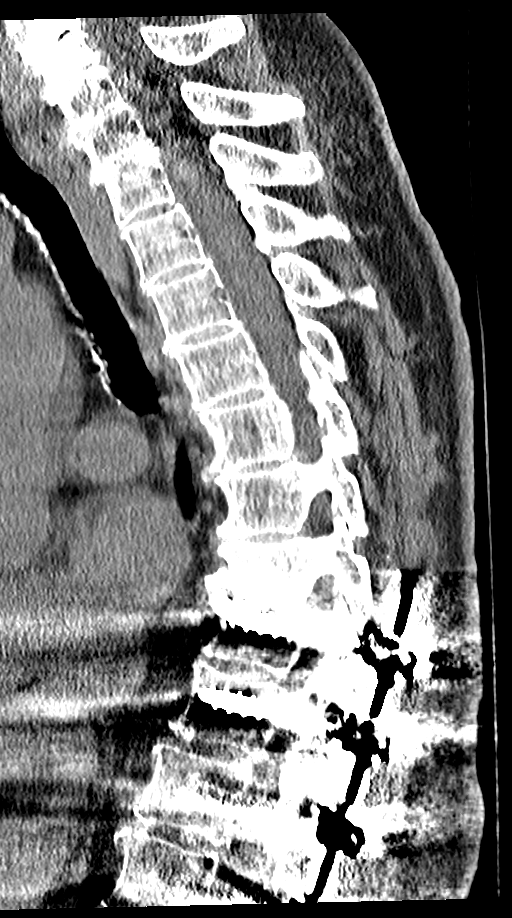
[im 34/68  bone]
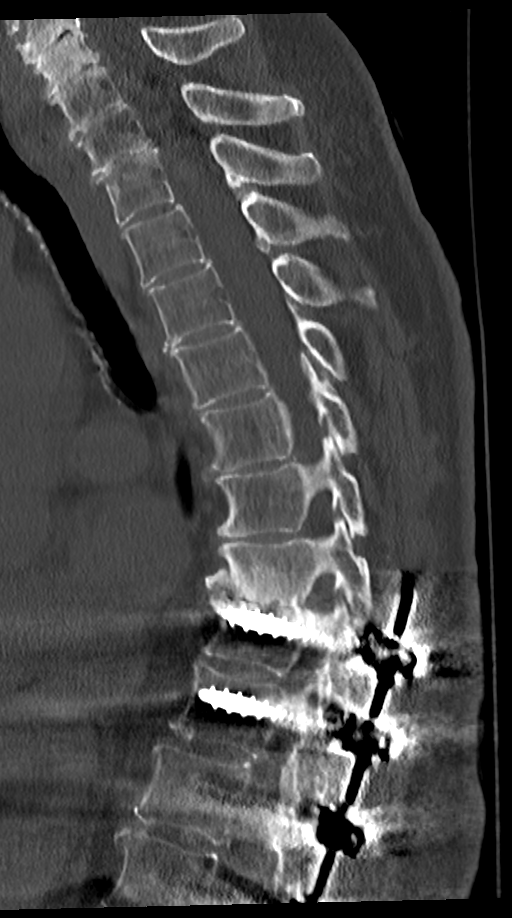
[im 40/68  bone]
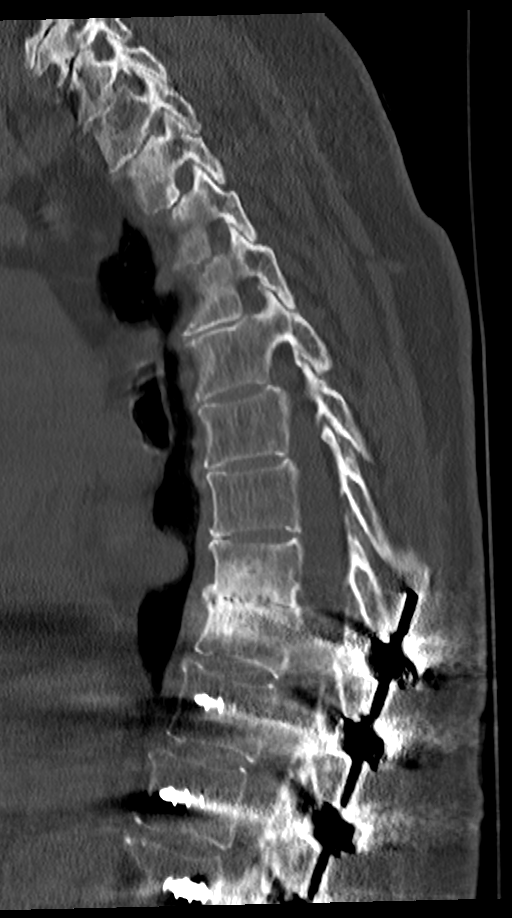
[im 45/68  bone]
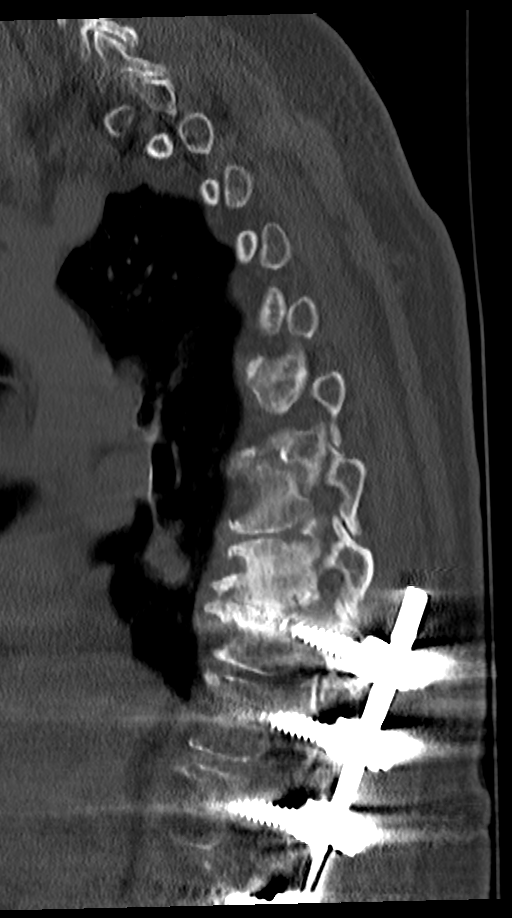

[Series 9: t-spine 2.00 cor · coronal · 0.27mm/px · 3 of 71 slices shown]
[im 15/71  bone]
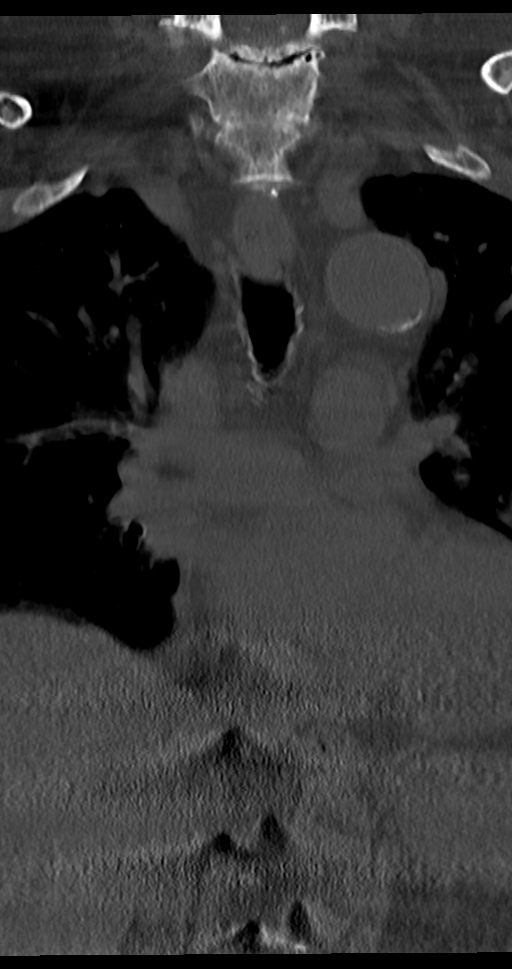
[im 29/71  bone]
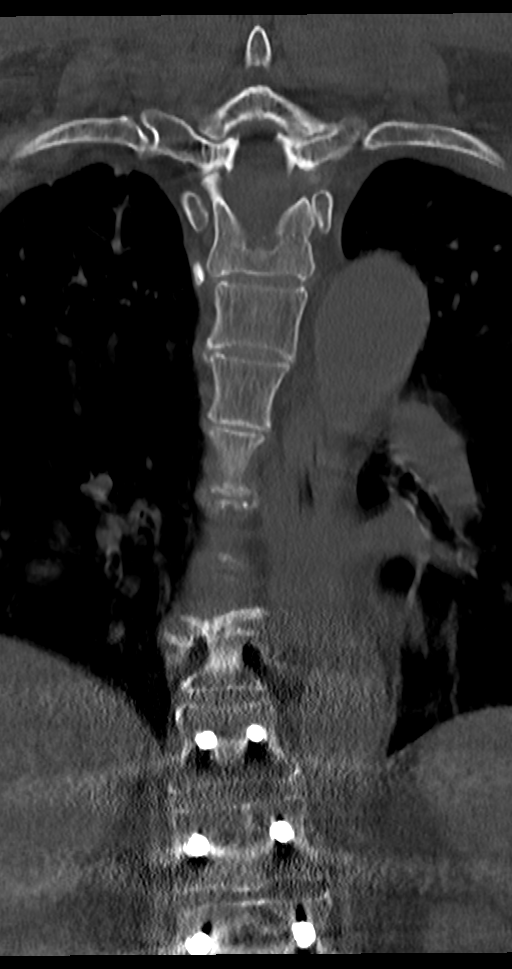
[im 43/71  bone]
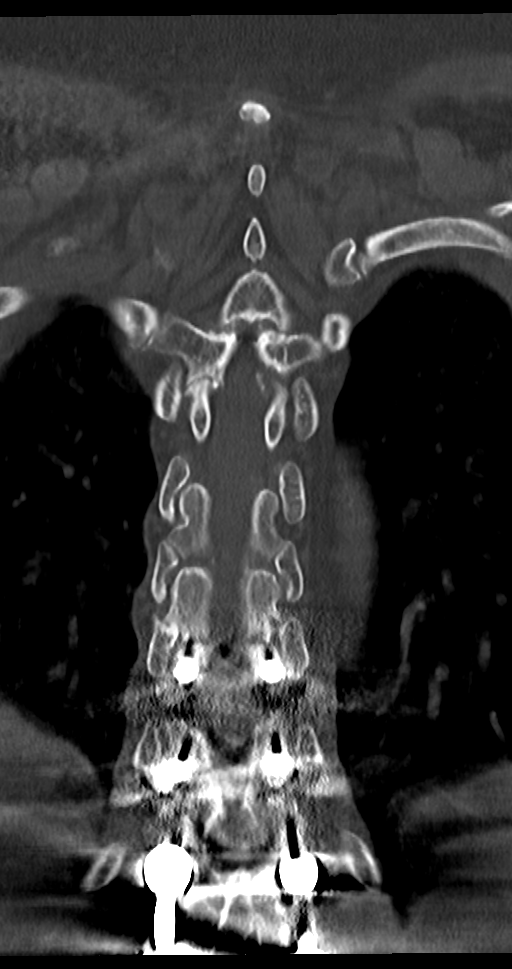

[10 of 33 positions shown; findings below may reference images not displayed]

FINDINGS: Alignment: Stable.

Vertebrae: Partially imaged posterior fusion hardware with rod and
pedicle fixation extending caudally T9 with associated streak
artifact. Appearance is stable without evidence of complication.
Vertebral body heights are unchanged.

Paraspinal and other soft tissues: No new abnormality.

Disc levels: Marked disc height loss with endplate irregularity and
sclerosis again identified at T8-T9. Disc bulge/protrusion at this
level on myelogram cannot be evaluated.
IMPRESSION: Stable appearance of partially imaged posterior fusion. Adjacent
level changes at T8-T9 appears similar to prior myelogram although
the spinal canal is obscured at this level.

## 2020-06-22 NOTE — Patient Instructions (Signed)
____________________________________________________________________________________________  Preparing for Procedure with Sedation  Procedure appointments are limited to planned procedures: . No Prescription Refills. . No disability issues will be discussed. . No medication changes will be discussed.  Instructions: . Oral Intake: Do not eat or drink anything for at least 8 hours prior to your procedure. (Exception: Blood Pressure Medication. See below.) . Transportation: Unless otherwise stated by your physician, you may drive yourself after the procedure. . Blood Pressure Medicine: Do not forget to take your blood pressure medicine with a sip of water the morning of the procedure. If your Diastolic (lower reading)is above 100 mmHg, elective cases will be cancelled/rescheduled. . Blood thinners: These will need to be stopped for procedures. Notify our staff if you are taking any blood thinners. Depending on which one you take, there will be specific instructions on how and when to stop it. . Diabetics on insulin: Notify the staff so that you can be scheduled 1st case in the morning. If your diabetes requires high dose insulin, take only  of your normal insulin dose the morning of the procedure and notify the staff that you have done so. . Preventing infections: Shower with an antibacterial soap the morning of your procedure. . Build-up your immune system: Take 1000 mg of Vitamin C with every meal (3 times a day) the day prior to your procedure. . Antibiotics: Inform the staff if you have a condition or reason that requires you to take antibiotics before dental procedures. . Pregnancy: If you are pregnant, call and cancel the procedure. . Sickness: If you have a cold, fever, or any active infections, call and cancel the procedure. . Arrival: You must be in the facility at least 30 minutes prior to your scheduled procedure. . Children: Do not bring children with you. . Dress appropriately:  Bring dark clothing that you would not mind if they get stained. . Valuables: Do not bring any jewelry or valuables.  Reasons to call and reschedule or cancel your procedure: (Following these recommendations will minimize the risk of a serious complication.) . Surgeries: Avoid having procedures within 2 weeks of any surgery. (Avoid for 2 weeks before or after any surgery). . Flu Shots: Avoid having procedures within 2 weeks of a flu shots or . (Avoid for 2 weeks before or after immunizations). . Barium: Avoid having a procedure within 7-10 days after having had a radiological study involving the use of radiological contrast. (Myelograms, Barium swallow or enema study). . Heart attacks: Avoid any elective procedures or surgeries for the initial 6 months after a "Myocardial Infarction" (Heart Attack). . Blood thinners: It is imperative that you stop these medications before procedures. Let us know if you if you take any blood thinner.  . Infection: Avoid procedures during or within two weeks of an infection (including chest colds or gastrointestinal problems). Symptoms associated with infections include: Localized redness, fever, chills, night sweats or profuse sweating, burning sensation when voiding, cough, congestion, stuffiness, runny nose, sore throat, diarrhea, nausea, vomiting, cold or Flu symptoms, recent or current infections. It is specially important if the infection is over the area that we intend to treat. . Heart and lung problems: Symptoms that may suggest an active cardiopulmonary problem include: cough, chest pain, breathing difficulties or shortness of breath, dizziness, ankle swelling, uncontrolled high or unusually low blood pressure, and/or palpitations. If you are experiencing any of these symptoms, cancel your procedure and contact your primary care physician for an evaluation.  Remember:  Regular Business hours are:    Monday to Thursday 8:00 AM to 4:00 PM  Provider's  Schedule: Synthia Fairbank, MD:  Procedure days: Tuesday and Thursday 7:30 AM to 4:00 PM  Bilal Lateef, MD:  Procedure days: Monday and Wednesday 7:30 AM to 4:00 PM ____________________________________________________________________________________________   ____________________________________________________________________________________________  General Risks and Possible Complications  Patient Responsibilities: It is important that you read this as it is part of your informed consent. It is our duty to inform you of the risks and possible complications associated with treatments offered to you. It is your responsibility as a patient to read this and to ask questions about anything that is not clear or that you believe was not covered in this document.  Patient's Rights: You have the right to refuse treatment. You also have the right to change your mind, even after initially having agreed to have the treatment done. However, under this last option, if you wait until the last second to change your mind, you may be charged for the materials used up to that point.  Introduction: Medicine is not an exact science. Everything in Medicine, including the lack of treatment(s), carries the potential for danger, harm, or loss (which is by definition: Risk). In Medicine, a complication is a secondary problem, condition, or disease that can aggravate an already existing one. All treatments carry the risk of possible complications. The fact that a side effects or complications occurs, does not imply that the treatment was conducted incorrectly. It must be clearly understood that these can happen even when everything is done following the highest safety standards.  No treatment: You can choose not to proceed with the proposed treatment alternative. The "PRO(s)" would include: avoiding the risk of complications associated with the therapy. The "CON(s)" would include: not getting any of the treatment  benefits. These benefits fall under one of three categories: diagnostic; therapeutic; and/or palliative. Diagnostic benefits include: getting information which can ultimately lead to improvement of the disease or symptom(s). Therapeutic benefits are those associated with the successful treatment of the disease. Finally, palliative benefits are those related to the decrease of the primary symptoms, without necessarily curing the condition (example: decreasing the pain from a flare-up of a chronic condition, such as incurable terminal cancer).  General Risks and Complications: These are associated to most interventional treatments. They can occur alone, or in combination. They fall under one of the following six (6) categories: no benefit or worsening of symptoms; bleeding; infection; nerve damage; allergic reactions; and/or death. 1. No benefits or worsening of symptoms: In Medicine there are no guarantees, only probabilities. No healthcare provider can ever guarantee that a medical treatment will work, they can only state the probability that it may. Furthermore, there is always the possibility that the condition may worsen, either directly, or indirectly, as a consequence of the treatment. 2. Bleeding: This is more common if the patient is taking a blood thinner, either prescription or over the counter (example: Goody Powders, Fish oil, Aspirin, Garlic, etc.), or if suffering a condition associated with impaired coagulation (example: Hemophilia, cirrhosis of the liver, low platelet counts, etc.). However, even if you do not have one on these, it can still happen. If you have any of these conditions, or take one of these drugs, make sure to notify your treating physician. 3. Infection: This is more common in patients with a compromised immune system, either due to disease (example: diabetes, cancer, human immunodeficiency virus [HIV], etc.), or due to medications or treatments (example: therapies used to treat  cancer and   rheumatological diseases). However, even if you do not have one on these, it can still happen. If you have any of these conditions, or take one of these drugs, make sure to notify your treating physician. 4. Nerve Damage: This is more common when the treatment is an invasive one, but it can also happen with the use of medications, such as those used in the treatment of cancer. The damage can occur to small secondary nerves, or to large primary ones, such as those in the spinal cord and brain. This damage may be temporary or permanent and it may lead to impairments that can range from temporary numbness to permanent paralysis and/or brain death. 5. Allergic Reactions: Any time a substance or material comes in contact with our body, there is the possibility of an allergic reaction. These can range from a mild skin rash (contact dermatitis) to a severe systemic reaction (anaphylactic reaction), which can result in death. 6. Death: In general, any medical intervention can result in death, most of the time due to an unforeseen complication. ____________________________________________________________________________________________   

## 2020-06-22 NOTE — Progress Notes (Signed)
Patient: Maria Spencer  Service Category: E/M  Provider: Gaspar Cola, MD  DOB: 12-Jan-1946  DOS: 06/22/2020  Location: Office  MRN: [id number]  Setting: Ambulatory outpatient  Referring Provider: McLean-Scocuzza, Olivia Mackie *  Type: Established Patient  Specialty: Interventional Pain Management  PCP: McLean-Scocuzza, Nino Glow, MD  Location: Remote location  Delivery: TeleHealth     Virtual Encounter - Pain Management PROVIDER NOTE: Information contained herein reflects review and annotations entered in association with encounter. Interpretation of such information and data should be left to medically-trained personnel. Information provided to patient can be located elsewhere in the medical record under "Patient Instructions". Document created using STT-dictation technology, any transcriptional errors that may result from process are unintentional.    Contact & Pharmacy Preferred: [place]: [phone] (home) Mobile: [phone] (mobile) E-mail: [email or web address]  [id number] [place]: [phone]: [phone] [facility] - [place]: [phone]: [phone]  Pre-screening  Maria Spencer offered "in-person" vs "virtual" encounter. She indicated preferring virtual for this encounter.   Reason COVID-19*  Social distancing based on CDC and AMA recommendations.   I contacted [name] 06/22/2020 via telephone.      I clearly identified myself as FGaspar Cola MD. I verified that I was speaking with the correct person using two identifiers (Name: PSevilla Murtagh and date of birth: 1Nov 05, 1947.  Consent I sought verbal advanced consent from PValere Drossfor virtual visit interactions. I informed [name] of possible security and privacy concerns, risks, and limitations  associated with providing "not-in-person" medical evaluation and management services. I also informed [name] of the availability of "in-person" appointments. Finally, I informed her that there would be a charge for the virtual visit and that she could be  personally, fully or partially, financially responsible for it. Ms. BSkousenexpressed understanding and agreed to proceed.   Historic Elements   Ms. PRyiah Bellissimois a 74y.o. year old, female patient evaluated today after her last contact with our practice on 06/07/2020. Maria Spencer has a past medical history of Arthritis, Breast cancer ([place] (2009), Chicken pox, Chronic UTI, Diverticular disease, Esophagitis, Hormone disorder, Hyperlipidemia, Hypertension, Personal history of radiation therapy (2009), and Thyroid disease. She also  has a past surgical history that includes Total shoulder replacement; Shoulder surgery; Tonsillectomy; Bone graft hip iliac crest; Spine surgery (10/23/2017); Ventral hernia repair (N/A, 05/29/2018); Insertion of mesh (N/A, 05/29/2018); Eye surgery; Cardiac catheterization; Breast lumpectomy (Right, 2009); Breast biopsy (Left, 2009); Breast biopsy (Right, 2009); and Colonoscopy with propofol (N/A, 05/15/2020). Ms. BFurnarihas a current medication list which includes the following prescription(s): atorvastatin, vitamin d3, ergocalciferol, fluticasone, forteo, levothyroxine, lisinopril, multi-vitamins, nitroglycerin, and zolpidem. She  reports that she has never smoked. She has never used smokeless tobacco. She reports previous alcohol use of about 7.0 standard drinks of alcohol per week. She reports that she does not use drugs. Ms. BGiaimois allergic to sulfa antibiotics, nitrofuran derivatives, amlodipine besylate, and nitrofurantoin.   HPI  Today, she is being contacted for a post-procedure assessment.  The patient indicated that once again she had good relief of the pain while the local anesthetic was working, but no long-term benefit.   In reviewing her chart and her imaging, I see that there is a CT myelogram that was done in February were it shows a bulging disc/protrusion  at the T8-9 level, on the right side.  Interestingly enough, the patient refers that her primary pain right now is on the right side of her thoracic spine and it seems to travel towards her right thoracic cage area/flank.  This would suggest a radicular component to her pain, in which case it would be intraspinal pathology, the one responsible for it.  Because of this, today I have suggested to the patient going in and doing a right-sided T9-10 thoracic epidural steroid injection under fluoroscopic guidance, to see if this provides her with better relief of the pain.  If it does, it would suggest that they approach here would be to perhaps going in and remove that this material.  She is pending to have the CT scan repeated today, which I believe is a good idea since he refers having difficulty ambulating, which would suggest that her condition has progressed.  She continues to be followed by [name], order the CT scan and who may be planning on doing some further surgery for her.  Post-Procedure Evaluation  Procedure (06/06/2020): Therapeutic bilateral T9, T10, T11, T12 MBB, thoracic facet block #2 under fluoroscopic guidance, no IV sedation Pre-procedure pain level: 7/10 Post-procedure: 0/10 (100% relief)  Sedation: None.  Effectiveness during initial hour after procedure(Ultra-Short Term Relief): 100 %.  Local anesthetic used: Long-acting (4-6 hours) Effectiveness: Defined as any analgesic benefit obtained secondary to the administration of local anesthetics. This carries significant diagnostic value as to the etiological location, or anatomical origin, of the pain. Duration of benefit is expected to coincide with the duration of the local anesthetic used.  Effectiveness during initial 4-6 hours after procedure(Short-Term Relief): 100 %.  Long-term  benefit: Defined as any relief past the pharmacologic duration of the local anesthetics.  Effectiveness past the initial 6 hours after procedure(Long-Term Relief): 0 %.  Current benefits: Defined as benefit that persist at this time.   Analgesia:  Back to baseline Function: Back to baseline ROM: Back to baseline  Pharmacotherapy Assessment  Analgesic: None Highest recorded MME/day: 75 mg/day MME/day: 0 mg/day   Monitoring: [place] PMP: PDMP reviewed during this encounter.       Pharmacotherapy: No side-effects or adverse reactions reported. Compliance: No problems identified. Effectiveness: Clinically acceptable. Plan: Refer to "POC".  UDS: No results found for: SUMMARY  Laboratory Chemistry Profile   Renal Lab Results  Component Value Date   BUN 24 (H) 05/08/2020   CREATININE 0.54 05/08/2020   BCR 28 (H) 09/10/2019   GFR 63.81 09/22/2018   GFRAA >60 05/08/2020   GFRNONAA >60 05/08/2020     Hepatic Lab Results  Component Value Date   AST 19 05/08/2020   ALT 15 05/08/2020   ALBUMIN 4.4 05/08/2020   ALKPHOS 97 05/08/2020   LIPASE 23 05/28/2018     Electrolytes Lab Results  Component Value Date   NA 138 05/08/2020   K 4.0 05/08/2020   CL 102 05/08/2020   CALCIUM 9.5 05/08/2020   MG 2.0 05/08/2020     Bone Lab Results  Component Value Date   VD25OH 19.57 (L) 05/08/2020     Inflammation (CRP: Acute Phase) (ESR: Chronic Phase) Lab Results  Component Value Date   CRP 0.6 05/08/2020   ESRSEDRATE 5 05/08/2020       Note: Above Lab results reviewed.   Imaging  DG PAIN CLINIC C-ARM 1-60 MIN NO REPORT Fluoro was used, but no Radiologist interpretation will be provided.  Please refer to "NOTES" tab for provider progress note.  Assessment  The primary encounter diagnosis was Chronic thoracic back pain (1ry area of Pain) (Bilateral) (R>L). 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  Plan of Care  Problem-specific:  No problem-specific Assessment & Plan notes found for this encounter.  Ms. Elveta Rape has a current medication list which includes the following long-term medication(s): atorvastatin, fluticasone, levothyroxine, lisinopril, and zolpidem.  Pharmacotherapy (Medications Ordered): No orders of the defined types were placed in this encounter.  Orders:  No orders of the defined types were placed in this encounter.  Follow-up plan:   Return for Procedure (w/ sedation): (R) T9-10 TESI #1.      Interventional treatment options: Planned, scheduled, and/or pending:   Diagnostic right T9-10 thoracic ESI #1    Under consideration:   Diagnostic right T9-10 thoracic ESI #1  Diagnostic bilateral T9, T10, T11, and T12 MMB thoracic facet RFA (NOT A GOOD CANDIDATE DUE TO HARDWARE)    Therapeutic/palliative (PRN):   Diagnostic bilateral T9, T10, T11, and T12 MMB thoracic facet block #3     Recent Visits Date Type Provider Dept  06/06/20 Procedure visit Milinda Pointer, MD [facility]  06/01/20 Telemedicine Milinda Pointer, MD [facility]  05/11/20 Procedure visit Milinda Pointer, MD [facility]  05/08/20 Office Visit Milinda Pointer, MD [facility]  Showing recent visits within past 90 days and meeting all other requirements Today's Visits Date Type Provider Dept  06/22/20 Telemedicine Milinda Pointer, MD [facility]  Showing today's  visits and meeting all other requirements Future Appointments No visits were found meeting these conditions. Showing future appointments within next 90 days and meeting all other requirements  I discussed the assessment and treatment plan with the patient. The patient was provided an opportunity to ask questions and all were answered. The patient agreed with the plan and demonstrated an understanding of the instructions.  Patient advised to call back or seek an in-person evaluation if the symptoms or condition worsens.  Duration of encounter: 18 minutes.  Note by: Gaspar Cola, MD Date: 06/22/2020; Time: 8:21 AM

## 2020-06-25 ENCOUNTER — Other Ambulatory Visit: Payer: Self-pay | Admitting: Pain Medicine

## 2020-06-25 DIAGNOSIS — E559 Vitamin D deficiency, unspecified: Secondary | ICD-10-CM

## 2020-07-02 ENCOUNTER — Other Ambulatory Visit: Payer: Self-pay | Admitting: Internal Medicine

## 2020-07-03 ENCOUNTER — Encounter: Payer: Self-pay | Admitting: Physician Assistant

## 2020-07-03 ENCOUNTER — Other Ambulatory Visit: Payer: Self-pay

## 2020-07-03 ENCOUNTER — Ambulatory Visit (INDEPENDENT_AMBULATORY_CARE_PROVIDER_SITE_OTHER): Payer: Medicare Other | Admitting: Physician Assistant

## 2020-07-03 VITALS — BP 137/71 | HR 108 | Temp 97.2°F | Ht 59.0 in | Wt 125.0 lb

## 2020-07-03 DIAGNOSIS — N390 Urinary tract infection, site not specified: Secondary | ICD-10-CM | POA: Diagnosis not present

## 2020-07-03 DIAGNOSIS — N3001 Acute cystitis with hematuria: Secondary | ICD-10-CM | POA: Diagnosis not present

## 2020-07-03 LAB — URINALYSIS, COMPLETE
Bilirubin, UA: NEGATIVE
Glucose, UA: NEGATIVE
Ketones, UA: NEGATIVE
Nitrite, UA: POSITIVE — AB
Specific Gravity, UA: 1.025 (ref 1.005–1.030)
Urobilinogen, Ur: 0.2 mg/dL (ref 0.2–1.0)
pH, UA: 5.5 (ref 5.0–7.5)

## 2020-07-03 LAB — MICROSCOPIC EXAMINATION: WBC, UA: >30 /hpf — AB (ref 0–5)

## 2020-07-03 LAB — BLADDER SCAN AMB NON-IMAGING: Scan Result: 44 mL

## 2020-07-03 MED ORDER — AMOXICILLIN-POT CLAVULANATE 875-125 MG PO TABS: 1.0000 | ORAL_TABLET | Freq: Two times a day (BID) | ORAL | 0 refills | Status: AC

## 2020-07-03 NOTE — Patient Instructions (Signed)
I have prescribed Augmentin today to treat your current UTI. Stop daily Keflex while you are taking Augmentin and you may restart it once you complete Augmentin. I will send your urine for culture today. I will call you if I need to switch your antibiotics based on your results. I would like you to return to clinic in one week to provide another urine sample so that we can make sure the blood in your urine has resolved.

## 2020-07-03 NOTE — Progress Notes (Signed)
07/03/2020 2:45 PM   Maria Spencer Jul 01, 1946 [id number]  CC: Chief Complaint  Patient presents with  . Recurrent UTI    HPI: Maria Spencer is a 74 y.o. female with PMH rUTI on 250mg  Keflex daily prophylaxis who presents today for evaluation of possible UTI. She is an established [facility] patient who last saw Dr. Matilde Sprang on 03/27/2020 for routine follow up of the above.  Today she reports onset of dysuria and difficulty urinating approximately 9 days ago. She increased her prophylactic Keflex to 500mg  BID x2 days before going out of town and leaving her medication at home. She reports improvement in her urinary symptoms after that initial treatment period. She states her symptoms returned 2-3 days ago and have not improved despite another 2-day course of Keflex 500mg  BID.  In-office UA today positive for 1+ blood, 2+ protein, nitrites, and 2+ leukocyte esterase; urine microscopy with >30 WBCs/HPF, 3-10 RBCs/HPF, and many bacteria. PVR 24mL.  PMH: Past Medical History:  Diagnosis Date  . Arthritis    neck, back, left knee;   . Breast cancer [facility]) 2009   right lumpectomy s/p radiation   . Chicken pox   . Chronic UTI    established with urology   . Diverticular disease    -osis and -itis   . Esophagitis    egd 05/17/14 see report scanned into chart  . Hormone disorder   . Hyperlipidemia   . Hypertension    controlled well with medication;   . Personal history of radiation therapy 2009   F/U right breast cancer  . Thyroid disease    hypothyroidism     Surgical History: Past Surgical History:  Procedure Laterality Date  . BONE GRAFT HIP ILIAC CREST     + cage left hip 10/23/17   . BREAST BIOPSY Left 2009   clip,benign  . BREAST BIOPSY Right 2009   +  . BREAST LUMPECTOMY Right 2009   2009 lumpectomy   . CARDIAC CATHETERIZATION     No stents; [place] doesn't recall facility +16yrs ago  . COLONOSCOPY WITH PROPOFOL N/A 05/15/2020   Procedure: COLONOSCOPY WITH  PROPOFOL;  Surgeon: Lin Landsman, MD;  Location: [facility] ENDOSCOPY;  Service: Gastroenterology;  Laterality: N/A;  . EYE SURGERY     b/l cataracts sch 10/2019 in [place]  . INSERTION OF MESH N/A 05/29/2018   Procedure: INSERTION OF MESH;  Surgeon: Johnathan Hausen, MD;  Location: [facility] ORS;  Service: General;  Laterality: N/A;  . SHOULDER SURGERY     x2 surgeries both shoulders, right shoulder replacement last in 2012   . SPINE SURGERY  10/23/2017   spinal 09/2017 h/o scoliosis [facility] L4-S1 OLIF T10 to ililum fusion  . TONSILLECTOMY     age 39 y.o.   . TOTAL SHOULDER REPLACEMENT    . VENTRAL HERNIA REPAIR N/A 05/29/2018   Procedure: LAPAROSCOPIC VENTRAL / Joy;  Surgeon: Johnathan Hausen, MD;  Location: [facility] OR[facility];  Service: General;  Laterality: N/A;    Home Medications:  Allergies as of 07/03/2020      Reactions   Sulfa Antibiotics Anaphylaxis, Hives, Itching, Shortness Of Breath, Swelling   Lips & throat swelled   Nitrofuran Derivatives Swelling   Lip swelling; "eyes get red and puffy"   Amlodipine Besylate Other (See Comments)   Gas, bloating  Gas, bloating    Nitrofurantoin Hives, Other (See Comments)      Medication List       Accurate as of July 03, 2020  2:45 PM. If you have any questions, ask your nurse or doctor.        amoxicillin-clavulanate 875-125 MG tablet Commonly known as: AUGMENTIN Take 1 tablet by mouth 2 (two) times daily for 5 days. Started by: Debroah Loop, PA-C   atorvastatin 10 MG tablet Commonly known as: LIPITOR Take 1 tablet (10 mg total) by mouth every other day. Note 10 mg no need to cut in 1/2 as before you have 20 mg taking 1/2 pill =10 mg   cephALEXin 250 MG capsule Commonly known as: KEFLEX Take 250 mg by mouth daily.   fluticasone 50 MCG/ACT nasal spray Commonly known as: FLONASE Place 2 sprays into both nostrils daily. Max b/l nostrils   Forteo 600 MCG/2.4ML Sopn Generic drug: Teriparatide (Recombinant)     levothyroxine 88 MCG tablet Commonly known as: SYNTHROID Take 1 tablet (88 mcg total) by mouth daily before breakfast. 30 minutes   lisinopril 40 MG tablet Commonly known as: ZESTRIL TAKE 1 TABLET DAILY   Multi-Vitamins Tabs Take 1 tablet by mouth daily.   nitroGLYCERIN 0.4 MG SL tablet Commonly known as: NITROSTAT Place under the tongue.   tiZANidine 2 MG tablet Commonly known as: ZANAFLEX Take 2 mg by mouth 3 (three) times daily as needed.   Vitamin D3 125 MCG (5000 UT) Caps Take 1 capsule (5,000 Units total) by mouth daily with breakfast. Take along with calcium and magnesium.   zolpidem 5 MG tablet Commonly known as: AMBIEN Take 1 tablet (5 mg total) by mouth at bedtime as needed for sleep.       Allergies:  Allergies  Allergen Reactions  . Sulfa Antibiotics Anaphylaxis, Hives, Itching, Shortness Of Breath and Swelling    Lips & throat swelled  . Nitrofuran Derivatives Swelling    Lip swelling; "eyes get red and puffy"  . Amlodipine Besylate Other (See Comments)    Gas, bloating   Gas, bloating   . Nitrofurantoin Hives and Other (See Comments)    Family History: Family History  Problem Relation Age of Onset  . Arthritis Mother   . Heart disease Mother 12  . Hyperlipidemia Mother   . Hypertension Mother   . Coronary artery disease Mother 56  . Arthritis Father   . Diabetes Father   . Cancer Father        colon  . AAA (abdominal aortic aneurysm) Father   . Arthritis Sister   . Hyperlipidemia Sister   . Hypertension Sister   . Cancer Brother        lung, smoker  . Mental retardation Brother   . Drug abuse Daughter        overdose in 2018   . Arthritis Sister   . Hyperlipidemia Sister   . Bladder Cancer Neg Hx   . Kidney cancer Neg Hx   . Breast cancer Neg Hx     Social History:   reports that she has never smoked. She has never used smokeless tobacco. She reports previous alcohol use of about 7.0 standard drinks of alcohol per week. She  reports that she does not use drugs.  Physical Exam: BP 137/71 (BP Location: Left Arm, Patient Position: Sitting, Cuff Size: Large)   Pulse (!) 108   Temp (!) 97.2 F (36.2 C) (Oral)   Ht 4\' 11"  (1.499 m)   Wt 125 lb (56.7 kg)   BMI 25.25 kg/m   Constitutional:  Alert and oriented, no acute distress, nontoxic appearing HEENT: [place], AT Cardiovascular: No clubbing, cyanosis, or  edema Respiratory: Normal respiratory effort, no increased work of breathing Skin: No rashes, bruises or suspicious lesions Neurologic: Grossly intact, no focal deficits, moving all 4 extremities Psychiatric: Normal mood and affect  Laboratory Data: Results for orders placed or performed in visit on 07/03/20  Microscopic Examination   Urine  Result Value Ref Range   WBC, UA >30 (A) 0 - 5 /hpf   RBC 3-10 (A) 0 - 2 /hpf   Epithelial Cells (non renal) 0-10 0 - 10 /hpf   Bacteria, UA Many (A) None seen/Few  Urinalysis, Complete  Result Value Ref Range   Specific Gravity, UA 1.025 1.005 - 1.030   pH, UA 5.5 5.0 - 7.5   Color, UA Yellow Yellow   Appearance Ur Cloudy (A) Clear   Leukocytes,UA 2+ (A) Negative   Protein,UA 2+ (A) Negative/Trace   Glucose, UA Negative Negative   Ketones, UA Negative Negative   RBC, UA 1+ (A) Negative   Bilirubin, UA Negative Negative   Urobilinogen, Ur 0.2 0.2 - 1.0 mg/dL   Nitrite, UA Positive (A) Negative   Microscopic Examination See below:   Bladder Scan (Post Void Residual) in office  Result Value Ref Range   Scan Result 44 mL   Assessment & Plan:   1. Acute cystitis with hematuria 74 year old female with recurrent UTI on 250 mg Keflex daily prophylaxis presents with grossly infected UA despite intermittent use of Keflex self start 500 mg twice daily dosing for a total of 4 days.  We will start empiric Augmentin and send for culture for further evaluation.  She will need repeat lab visit for UA to prove resolution of MH in approximately 1 week.  She expressed  understanding. - Urinalysis, Complete - CULTURE, URINE COMPREHENSIVE - amoxicillin-clavulanate (AUGMENTIN) 875-125 MG tablet; Take 1 tablet by mouth 2 (two) times daily for 5 days.  Dispense: 10 tablet; Refill: 0 - Urinalysis, Complete; Future - Bladder Scan (Post Void Residual) in office   Return in about 1 week (around 07/10/2020) for Lab visit for UA.  Debroah Loop, PA-C  [facility] [address] [place], [place] [address] [phone]

## 2020-07-04 ENCOUNTER — Ambulatory Visit: Payer: Medicare Other | Admitting: Pain Medicine

## 2020-07-08 LAB — CULTURE, URINE COMPREHENSIVE

## 2020-07-10 ENCOUNTER — Other Ambulatory Visit: Payer: Medicare Other

## 2020-07-10 ENCOUNTER — Telehealth: Payer: Self-pay | Admitting: Physician Assistant

## 2020-07-10 ENCOUNTER — Other Ambulatory Visit: Payer: Self-pay

## 2020-07-10 DIAGNOSIS — N3001 Acute cystitis with hematuria: Secondary | ICD-10-CM | POA: Diagnosis not present

## 2020-07-10 LAB — URINALYSIS, COMPLETE
Bilirubin, UA: NEGATIVE
Glucose, UA: NEGATIVE
Ketones, UA: NEGATIVE
Nitrite, UA: NEGATIVE
Protein,UA: NEGATIVE
RBC, UA: NEGATIVE
Specific Gravity, UA: 1.02 (ref 1.005–1.030)
Urobilinogen, Ur: 0.2 mg/dL (ref 0.2–1.0)
pH, UA: 7 (ref 5.0–7.5)

## 2020-07-10 LAB — MICROSCOPIC EXAMINATION
Bacteria, UA: NONE SEEN
RBC, Urine: NONE SEEN /hpf (ref 0–2)

## 2020-07-10 MED ORDER — DOXYCYCLINE HYCLATE 100 MG PO CAPS
100.0000 mg | ORAL_CAPSULE | Freq: Two times a day (BID) | ORAL | 0 refills | Status: AC
Start: 2020-07-10 — End: 2020-07-17

## 2020-07-10 NOTE — Telephone Encounter (Signed)
Notified patient as advised. Patient voiced understanding.

## 2020-07-10 NOTE — Telephone Encounter (Signed)
Please contact the patient and inform her that her urine culture has finalized.  The bacteria she is growing is resistant to the Augmentin that I prescribed.  Please counsel her to stop Augmentin at this time.  I have sent doxycycline 100 mg twice daily x7 days to the [facility] in [place].  I had like her to start this ASAP.

## 2020-07-11 ENCOUNTER — Ambulatory Visit (INDEPENDENT_AMBULATORY_CARE_PROVIDER_SITE_OTHER): Payer: Medicare Other | Admitting: Internal Medicine

## 2020-07-11 ENCOUNTER — Encounter: Payer: Self-pay | Admitting: Internal Medicine

## 2020-07-11 ENCOUNTER — Telehealth: Payer: Self-pay | Admitting: Physician Assistant

## 2020-07-11 VITALS — BP 144/82 | HR 82 | Temp 98.6°F | Ht 59.0 in | Wt 129.0 lb

## 2020-07-11 DIAGNOSIS — E039 Hypothyroidism, unspecified: Secondary | ICD-10-CM

## 2020-07-11 DIAGNOSIS — R5383 Other fatigue: Secondary | ICD-10-CM

## 2020-07-11 DIAGNOSIS — M549 Dorsalgia, unspecified: Secondary | ICD-10-CM | POA: Diagnosis not present

## 2020-07-11 DIAGNOSIS — M810 Age-related osteoporosis without current pathological fracture: Secondary | ICD-10-CM | POA: Diagnosis not present

## 2020-07-11 DIAGNOSIS — E559 Vitamin D deficiency, unspecified: Secondary | ICD-10-CM

## 2020-07-11 DIAGNOSIS — E785 Hyperlipidemia, unspecified: Secondary | ICD-10-CM | POA: Diagnosis not present

## 2020-07-11 DIAGNOSIS — E611 Iron deficiency: Secondary | ICD-10-CM | POA: Diagnosis not present

## 2020-07-11 DIAGNOSIS — G4700 Insomnia, unspecified: Secondary | ICD-10-CM | POA: Insufficient documentation

## 2020-07-11 DIAGNOSIS — I10 Essential (primary) hypertension: Secondary | ICD-10-CM

## 2020-07-11 DIAGNOSIS — N3000 Acute cystitis without hematuria: Secondary | ICD-10-CM | POA: Diagnosis not present

## 2020-07-11 MED ORDER — METOPROLOL SUCCINATE ER 25 MG PO TB24: 25.0000 mg | ORAL_TABLET | Freq: Every day | ORAL | 3 refills | Status: DC

## 2020-07-11 MED ORDER — CHOLECALCIFEROL 1.25 MG (50000 UT) PO CAPS: 50000.0000 [IU] | ORAL_CAPSULE | ORAL | 1 refills | Status: DC

## 2020-07-11 MED ORDER — LISINOPRIL 40 MG PO TABS: 40.0000 mg | ORAL_TABLET | Freq: Every day | ORAL | 3 refills | Status: DC

## 2020-07-11 MED ORDER — LEVOTHYROXINE SODIUM 88 MCG PO TABS: 88.0000 ug | ORAL_TABLET | Freq: Every day | ORAL | 3 refills | Status: DC

## 2020-07-11 MED ORDER — ZOLPIDEM TARTRATE 5 MG PO TABS: 5.0000 mg | ORAL_TABLET | Freq: Every evening | ORAL | 1 refills | Status: DC | PRN

## 2020-07-11 NOTE — Progress Notes (Signed)
Chief Complaint  Patient presents with  . Hypertension  . Medication Management   F/u  1. HTN uncontrolled off metoprolol 25 mg qd on lis 40 mg qd she is having heart palpitations will restart BB and monitor BP  2. Mid back pain and back surgery sch 07/24/20 Dr. Daun Peacock she is walking with cane now took forteo x 4 months due to osteoporosis but ? Tolerability she reports feels heart is racing on this medication after dose  7/10 pain today 3. Osteoporosis she wants to see Dr. Honor Junes instead of NP/PA with [facility]  4. Recurrent UTI 07/03/20 enterococcus took Augmentin but resistant agreeable to take doxycyline and was getting UTIs on keflex so stopped this  F/u urology 5. C/o fatigue with iron def recheck labs on levo 88 mcg 01/2020 tsh ~3.15 normal    Review of Systems  Constitutional: Positive for malaise/fatigue.  HENT: Negative for hearing loss.   Eyes: Negative for blurred vision.  Respiratory: Negative for wheezing.   Cardiovascular: Negative for chest pain.  Musculoskeletal: Positive for back pain. Negative for falls.  Neurological: Negative for headaches.  Psychiatric/Behavioral: Negative for depression.   Past Medical History:  Diagnosis Date  . Arthritis    neck, back, left knee;   . Breast cancer [facility]) 2009   right lumpectomy s/p radiation   . Chicken pox   . Chronic UTI    established with urology   . Diverticular disease    -osis and -itis   . Esophagitis    egd 05/17/14 see report scanned into chart  . Hormone disorder   . Hyperlipidemia   . Hypertension    controlled well with medication;   . Personal history of radiation therapy 2009   F/U right breast cancer  . Thyroid disease    hypothyroidism    Past Surgical History:  Procedure Laterality Date  . BONE GRAFT HIP ILIAC CREST     + cage left hip 10/23/17   . BREAST BIOPSY Left 2009   clip,benign  . BREAST BIOPSY Right 2009   +  . BREAST LUMPECTOMY Right 2009   2009 lumpectomy   . CARDIAC  CATHETERIZATION     No stents; [place] doesn't recall facility +67yrs ago  . COLONOSCOPY WITH PROPOFOL N/A 05/15/2020   Procedure: COLONOSCOPY WITH PROPOFOL;  Surgeon: Lin Landsman, MD;  [facility] ENDOSCOPY;  Service: Gastroenterology;  Laterality: N/A;  . EYE SURGERY     b/l cataracts sch 10/2019 in [place]  . INSERTION OF MESH N/A 05/29/2018   Procedure: INSERTION OF MESH;  Surgeon: Johnathan Hausen, MD;  Location: [facility] ORS;  Service: General;  Laterality: N/A;  . SHOULDER SURGERY     x2 surgeries both shoulders, right shoulder replacement last in 2012   . SPINE SURGERY  10/23/2017   spinal 09/2017 h/o scoliosis [facility] L4-S1 OLIF T10 to ililum fusion  . TONSILLECTOMY     age 24 y.o.   . TOTAL SHOULDER REPLACEMENT    . VENTRAL HERNIA REPAIR N/A 05/29/2018   Procedure: LAPAROSCOPIC VENTRAL / [place];  Surgeon: Johnathan Hausen, MD;  Location: [facility] ORS;  Service: General;  Laterality: N/A;   Family History  Problem Relation Age of Onset  . Arthritis Mother   . Heart disease Mother 47  . Hyperlipidemia Mother   . Hypertension Mother   . Coronary artery disease Mother 70  . Arthritis Father   . Diabetes Father   . Cancer Father  colon  . AAA (abdominal aortic aneurysm) Father   . Arthritis Sister   . Hyperlipidemia Sister   . Hypertension Sister   . Cancer Brother        lung, smoker  . Mental retardation Brother   . Drug abuse Daughter        overdose in 2018   . Arthritis Sister   . Hyperlipidemia Sister   . Bladder Cancer Neg Hx   . Kidney cancer Neg Hx   . Breast cancer Neg Hx    Social History   Socioeconomic History  . Marital status: Widowed    Spouse name: Not on file  . Number of children: Not on file  . Years of education: Not on file  . Highest education level: Not on file  Occupational History  . Not on file  Tobacco Use  . Smoking status: Never Smoker  . Smokeless tobacco: Never Used  Vaping Use  . Vaping Use: Never used   Substance and Sexual Activity  . Alcohol use: Not Currently    Alcohol/week: 7.0 standard drinks    Types: 7 Glasses of wine per week  . Drug use: No  . Sexual activity: Yes    Birth control/protection: Post-menopausal  Other Topics Concern  . Not on file  Social History Narrative   College grad   widowed husband died August 06, 2019    Wears seatbelt and safe in relationship    3 kids       [place]   Social Determinants of Health   Financial Resource Strain:   . Difficulty of Paying Living Expenses:   Food Insecurity:   . Worried About Charity fundraiser in the Last Year:   . Arboriculturist in the Last Year:   Transportation Needs:   . Film/video editor (Medical):   Marland Kitchen Lack of Transportation (Non-Medical):   Physical Activity:   . Days of Exercise per Week:   . Minutes of Exercise per Session:   Stress:   . Feeling of Stress :   Social Connections: Unknown  . Frequency of Communication with Friends and Family: Not on file  . Frequency of Social Gatherings with Friends and Family: Not on file  . Attends Religious Services: Not on file  . Active Member of Clubs or Organizations: Not on file  . Attends Archivist Meetings: Not on file  . Marital Status: Widowed  Intimate Partner Violence:   . Fear of Current or Ex-Partner:   . Emotionally Abused:   Marland Kitchen Physically Abused:   . Sexually Abused:    Current Meds  Medication Sig  . atorvastatin (LIPITOR) 10 MG tablet Take 1 tablet (10 mg total) by mouth every other day. Note 10 mg no need to cut in 1/2 as before you have 20 mg taking 1/2 pill =10 mg  . fluticasone (FLONASE) 50 MCG/ACT nasal spray Place 2 sprays into both nostrils daily. Max b/l nostrils  . levothyroxine (SYNTHROID) 88 MCG tablet Take 1 tablet (88 mcg total) by mouth daily before breakfast. 30 minutes  . lisinopril (ZESTRIL) 40 MG tablet Take 1 tablet (40 mg total) by mouth daily.  . Multiple Vitamin (MULTI-VITAMINS) TABS Take 1 tablet  by mouth daily.   . nitroGLYCERIN (NITROSTAT) 0.4 MG SL tablet Place under the tongue.  Marland Kitchen zolpidem (AMBIEN) 5 MG tablet Take 1 tablet (5 mg total) by mouth at bedtime as needed for sleep.  . [DISCONTINUED] Cholecalciferol (VITAMIN D3) 125 MCG (  5000 UT) CAPS Take 1 capsule (5,000 Units total) by mouth daily with breakfast. Take along with calcium and magnesium.  . [DISCONTINUED] levothyroxine (SYNTHROID) 88 MCG tablet Take 1 tablet (88 mcg total) by mouth daily before breakfast. 30 minutes  . [DISCONTINUED] lisinopril (ZESTRIL) 40 MG tablet TAKE 1 TABLET DAILY (NEED TO CONTACT OFFICE TO SCHEDULE FUTURE APPOINTMENT AND REFILLS)  . [DISCONTINUED] zolpidem (AMBIEN) 5 MG tablet Take 1 tablet (5 mg total) by mouth at bedtime as needed for sleep.   Allergies  Allergen Reactions  . Sulfa Antibiotics Anaphylaxis, Hives, Itching, Shortness Of Breath and Swelling    Lips & throat swelled  . Nitrofuran Derivatives Swelling    Lip swelling; "eyes get red and puffy"  . Amlodipine Besylate Other (See Comments)    Gas, bloating   Gas, bloating   . Nitrofurantoin Hives and Other (See Comments)   Recent Results (from the past 2160 hour(s))  C-reactive protein     Status: None   Collection Time: 05/08/20 11:43 AM  Result Value Ref Range   CRP 0.6 <1.0 mg/dL    Comment: Performed at [facility] [address] [place], [place] [address]  VITAMIN D 25 Hydroxy (Vit-D Deficiency, Fractures)     Status: Abnormal   Collection Time: 05/08/20 11:43 AM  Result Value Ref Range   Vit D, 25-Hydroxy 19.57 (L) 30 - 100 ng/mL    Comment: (NOTE) Vitamin D deficiency has been defined by the Institute of Medicine  and an Endocrine Society practice guideline as a level of serum 25-OH  vitamin D less than 20 ng/mL (1,2). The Endocrine Society went on to  further define vitamin D insufficiency as a level between 21 and 29  ng/mL (2).  1. IOM (Institute of Medicine). 2010. Dietary reference intakes for  calcium  and D. [place]: The [employer]. 2. Holick MF, Binkley [place], Bischoff-Ferrari HA, et al. Evaluation,  treatment, and prevention of vitamin D deficiency: an Endocrine  Society clinical practice guideline, JCEM. 2011 Jul; 96(7): 1911-30.  Performed at [facility], [place] [address] [place], [place] [address]   Sedimentation rate     Status: None   Collection Time: 05/08/20 11:43 AM  Result Value Ref Range   Sed Rate 5 0 - 30 mm/hr    Comment: Performed at [facility], [address] [place], [place] [address]  Vitamin B12     Status: None   Collection Time: 05/08/20 11:43 AM  Result Value Ref Range   Vitamin B-12 278 180 - 914 pg/mL    Comment: (NOTE) This assay is not validated for testing neonatal or myeloproliferative syndrome specimens for Vitamin B12 levels. Performed at [facility], [place] [address] [place], [place] [address]   Magnesium     Status: None   Collection Time: 05/08/20 11:43 AM  Result Value Ref Range   Magnesium 2.0 1.7 - 2.4 mg/dL    Comment: Performed at [facility], [address] [place], [place] [address]  Comprehensive metabolic panel     Status: Abnormal   Collection Time: 05/08/20 11:43 AM  Result Value Ref Range   Sodium 138 135 - 145 mmol/L   Potassium 4.0 3.5 - 5.1 mmol/L   Chloride 102 98 - 111 mmol/L   CO2 26 22 - 32 mmol/L   Glucose, Bld 102 (H) 70 - 99 mg/dL    Comment: Glucose reference range applies only to samples taken after fasting for  at least 8 hours.   BUN 24 (H) 8 - 23 mg/dL   Creatinine, Ser 0.54 0.44 - 1.00 mg/dL   Calcium 9.5 8.9 - 10.3 mg/dL   Total Protein 7.2 6.5 - 8.1 g/dL   Albumin 4.4 3.5 - 5.0 g/dL   AST 19 15 - 41 U/L   ALT 15 0 - 44 U/L   Alkaline Phosphatase 97 38 - 126 U/L   Total Bilirubin 0.9 0.3 - 1.2 mg/dL   GFR calc non Af Amer >60 >60 mL/min   GFR calc Af Amer >60 >60 mL/min   Anion gap 10 5 - 15    Comment: Performed at [facility], [address] [place], [place] [address]  SARS CORONAVIRUS 2 (TAT 6-24 HRS) Nasopharyngeal Nasopharyngeal Swab     Status: None   Collection Time: 05/11/20  9:02 AM   Specimen: Nasopharyngeal Swab  Result Value Ref Range   SARS Coronavirus 2 NEGATIVE NEGATIVE    Comment: (NOTE) SARS-CoV-2 target nucleic acids are NOT DETECTED.  The SARS-CoV-2 RNA is generally detectable in upper and lower respiratory specimens during the acute phase of infection. Negative results do not preclude SARS-CoV-2 infection, do not rule out co-infections with other pathogens, and should not be used as the sole basis for treatment or other patient management decisions. Negative results must be combined with clinical observations, patient history, and epidemiological information. The expected result is Negative.  Fact Sheet for Patients: SugarRoll.be  Fact Sheet for Healthcare Providers: https://www.woods-mathews.com/  This test is not yet approved or cleared by the [place] FDA and  has been authorized for detection and/or diagnosis of SARS-CoV-2 by FDA under an Emergency Use Authorization (EUA). This EUA will remain  in effect (meaning this test can be used) for the duration of the COVID-19 declaration under Se ction 564(b)(1) of the Act, 21 U.S.C. section 360bbb-3(b)(1), unless the authorization is terminated or revoked sooner.  Performed at [facility], [place] [address] [place], [place] [address]   Surgical pathology     Status: None   Collection Time: 05/15/20  9:03 AM  Result Value Ref Range   SURGICAL PATHOLOGY      SURGICAL PATHOLOGY CASE: [id number] PATIENT: Bryley Stiverson Surgical Pathology Report     Specimen Submitted: A. Colon polyp, transverse; cold snare  Clinical History: Screening Z12.11.  Diverticulosis, colon polyp, external hemorrhoids     DIAGNOSIS: A.  COLON POLYP, TRANSVERSE; COLD SNARE: - TUBULAR ADENOMA. - NEGATIVE  FOR HIGH-GRADE DYSPLASIA AND MALIGNANCY.  GROSS DESCRIPTION: A. Labeled: Transverse colon polyp, cold snare x1 Received: Formalin Tissue fragment(s): Multiple Size: Aggregate, 1.5 x 1 x 0.3 cm Description: Tan soft tissue fragments with admixed fecal matter Entirely submitted in 1 cassette.    Final Diagnosis performed by Bryan Lemma, MD.   Electronically signed 05/16/2020 6:54:26PM The electronic signature indicates that the named Attending Pathologist has evaluated the specimen Technical component performed at [facility], [address] [place], [place] [address] Lab: [phone] Dir: Rush Farmer, MD, MMM  Professional component p erformed at [facility], [facility], [place], [place], [place] [address] Lab: [phone] Dir: Dellia Nims. Rubinas, MD   Urinalysis, Complete     Status: Abnormal   Collection Time: 07/03/20  2:03 PM  Result Value Ref Range   Specific Gravity, UA 1.025 1.005 - 1.030   pH, UA 5.5 5.0 - 7.5   Color, UA Yellow Yellow   Appearance Ur Cloudy (A) Clear   Leukocytes,UA 2+ (  A) Negative   Protein,UA 2+ (A) Negative/Trace   Glucose, UA Negative Negative   Ketones, UA Negative Negative   RBC, UA 1+ (A) Negative   Bilirubin, UA Negative Negative   Urobilinogen, Ur 0.2 0.2 - 1.0 mg/dL   Nitrite, UA Positive (A) Negative   Microscopic Examination See below:   Microscopic Examination     Status: Abnormal   Collection Time: 07/03/20  2:03 PM   Urine  Result Value Ref Range   WBC, UA >30 (A) 0 - 5 /hpf   RBC 3-10 (A) 0 - 2 /hpf   Epithelial Cells (non renal) 0-10 0 - 10 /hpf   Bacteria, UA Many (A) None seen/Few  CULTURE, URINE COMPREHENSIVE     Status: Abnormal   Collection Time: 07/03/20  2:32 PM   Specimen: Urine   UR  Result Value Ref Range   Urine Culture, Comprehensive Final report (A)    Organism ID, Bacteria Comment (A)     Comment: Enterobacter cloacae complex 25,000-50,000 colony forming units per mL    Organism ID,  Bacteria Comment     Comment: Mixed urogenital flora 10,000-25,000 colony forming units per mL    ANTIMICROBIAL SUSCEPTIBILITY Comment     Comment:       ** S = Susceptible; I = Intermediate; R = Resistant **                    P = Positive; N = Negative             MICS are expressed in micrograms per mL    Antibiotic                 RSLT#1    RSLT#2    RSLT#3    RSLT#4 Amoxicillin/Clavulanic Acid    R Cefazolin                      R Cefepime                       S Cefuroxime                     R Ciprofloxacin                  S Gentamicin                     S Imipenem                       S Levofloxacin                   S Meropenem                      S Nitrofurantoin                 S Tetracycline                   S Tobramycin                     S Trimethoprim/Sulfa             S   Bladder Scan (Post Void Residual) in office     Status: None   Collection Time: 07/03/20  2:44 PM  Result Value Ref Range   Scan Result 44 mL  Urinalysis, Complete  Status: Abnormal   Collection Time: 07/10/20  1:28 PM  Result Value Ref Range   Specific Gravity, UA 1.020 1.005 - 1.030   pH, UA 7.0 5.0 - 7.5   Color, UA Yellow Yellow   Appearance Ur Clear Clear   Leukocytes,UA Trace (A) Negative   Protein,UA Negative Negative/Trace   Glucose, UA Negative Negative   Ketones, UA Negative Negative   RBC, UA Negative Negative   Bilirubin, UA Negative Negative   Urobilinogen, Ur 0.2 0.2 - 1.0 mg/dL   Nitrite, UA Negative Negative   Microscopic Examination See below:   Microscopic Examination     Status: None   Collection Time: 07/10/20  1:28 PM   Urine  Result Value Ref Range   WBC, UA 0-5 0 - 5 /hpf   RBC None seen 0 - 2 /hpf   Epithelial Cells (non renal) 0-10 0 - 10 /hpf   Bacteria, UA None seen None seen/Few   Objective  Body mass index is 26.05 kg/m. Wt Readings from Last 3 Encounters:  07/11/20 129 lb (58.5 kg)  07/03/20 125 lb (56.7 kg)  06/06/20 126 lb (57.2 kg)    Temp Readings from Last 3 Encounters:  07/11/20 98.6 F (37 C) (Oral)  07/03/20 (!) 97.2 F (36.2 C) (Oral)  06/06/20 98 F (36.7 C)   BP Readings from Last 3 Encounters:  07/11/20 (!) 144/82  07/03/20 137/71  06/06/20 (!) 159/90   Pulse Readings from Last 3 Encounters:  07/11/20 82  07/03/20 (!) 108  06/06/20 87    Physical Exam Vitals and nursing note reviewed.  Constitutional:      Appearance: Normal appearance. She is well-developed and well-groomed.  HENT:     Head: Normocephalic and atraumatic.  Eyes:     Conjunctiva/sclera: Conjunctivae normal.     Pupils: Pupils are equal, round, and reactive to light.  Cardiovascular:     Rate and Rhythm: Normal rate and regular rhythm.     Heart sounds: Normal heart sounds. No murmur heard.   Pulmonary:     Effort: Pulmonary effort is normal.     Breath sounds: Normal breath sounds.  Skin:    General: Skin is warm and dry.  Neurological:     General: No focal deficit present.     Mental Status: She is alert and oriented to person, place, and time. Mental status is at baseline.     Gait: Gait abnormal.     Comments: Walking with cane today  Psychiatric:        Attention and Perception: Attention and perception normal.        Mood and Affect: Mood and affect normal.        Speech: Speech normal.        Behavior: Behavior normal. Behavior is cooperative.        Thought Content: Thought content normal.        Cognition and Memory: Cognition and memory normal.        Judgment: Judgment normal.     Assessment  Plan  Essential hypertension - Plan: metoprolol succinate (TOPROL-XL) 25 MG 24 hr tablet, lisinopril (ZESTRIL) 40 MG tablet  Vitamin D deficiency - Plan: Cholecalciferol 1.25 MG (50000 UT) capsule  Acute cystitis without hematuria F/u urology  rec pick up doxycycline   Hyperlipidemia, unspecified hyperlipidemia type - Plan: Lipid panel  Iron deficiency - Plan: Iron, TIBC and Ferritin Panel  Insomnia,  unspecified type - Plan: zolpidem (AMBIEN) 5 MG tablet  Hypothyroidism, unspecified  type - Plan: levothyroxine (SYNTHROID) 88 MCG tablet tsh checked 01/2020   Fatigue, unspecified type Check iron   Mid back pain Mid back surgery sch 07/24/20  Osteoporosis, unspecified osteoporosis type, unspecified pathological fracture presence  F/u [facility] endocrine ? Tolerance with forteo pt reports palpitations with this medication  F/u Dr. Honor Junes only requested not PA/NP She has taking forteo x 4 months in prep for mid back surgery   HM Had flu shot9/14/20 prevnar had2/4/19, pna 23 10/10/18 Tdaphad 12/09/11 Declinesshingrix, hep B vaccineprev. zostervax had 2012  covid 64 vx pfizer 2/2 Never smoker  Consider shingrix vaccine given Rx 07/11/20  s/p right breast lumpectomy 2009 and radiation only  -mammogram7/6/20 negative, 06/05/20 negative   Pap out of age window no h/o abnormal pap  DEXAhad 6/4/14h/o osteopenia, +osteoporosis 12/27/19  06/02/20 T -3.0, -3.2, -3.7  Colonoscopy copy obtained reviewed 01/10/09 grade 1 IH and diverticulosis -colonoscopy due at f/uwill hold for now with other multiple appts 05/15/20 tubular adenoma FH father colon cancer dx'ed 14 y.o  rec healthy diet and exercise   Provider: Dr. Olivia Mackie McLean-Scocuzza-Internal Medicine

## 2020-07-11 NOTE — Telephone Encounter (Signed)
Please contact the patient and inform her that her repeat UA from yesterday shows resolution of the blood in her urine.  This is great news. Additionally, it does look like her infection has cleared up despite urine culture reports indicating a resistant infection.  If her symptoms have improved, she can discontinue antibiotics.

## 2020-07-11 NOTE — Patient Instructions (Addendum)
Goal blood pressure <130/<80 [facility]  [place], [place] [address]  [phone]    Consider shingrix vaccine x 2 doses   Zoster Vaccine, Recombinant injection What is this medicine? ZOSTER VACCINE (ZOS ter vak SEEN) is used to prevent shingles in adults 74 years old and over. This vaccine is not used to treat shingles or nerve pain from shingles. This medicine may be used for other purposes; ask your health care provider or pharmacist if you have questions. COMMON BRAND NAME(S): [facility] What should I tell my health care provider before I take this medicine? They need to know if you have any of these conditions:  blood disorders or disease  cancer like leukemia or lymphoma  immune system problems or therapy  an unusual or allergic reaction to vaccines, other medications, foods, dyes, or preservatives  pregnant or trying to get pregnant  breast-feeding How should I use this medicine? This vaccine is for injection in a muscle. It is given by a health care professional. Talk to your pediatrician regarding the use of this medicine in children. This medicine is not approved for use in children. Overdosage: If you think you have taken too much of this medicine contact a poison control center or emergency room at once. NOTE: This medicine is only for you. Do not share this medicine with others. What if I miss a dose? Keep appointments for follow-up (booster) doses as directed. It is important not to miss your dose. Call your doctor or health care professional if you are unable to keep an appointment. What may interact with this medicine?  medicines that suppress your immune system  medicines to treat cancer  steroid medicines like prednisone or cortisone This list may not describe all possible interactions. Give your health care provider a list of all the medicines, herbs, non-prescription drugs, or dietary supplements you use. Also  tell them if you smoke, drink alcohol, or use illegal drugs. Some items may interact with your medicine. What should I watch for while using this medicine? Visit your doctor for regular check ups. This vaccine, like all vaccines, may not fully protect everyone. What side effects may I notice from receiving this medicine? Side effects that you should report to your doctor or health care professional as soon as possible:  allergic reactions like skin rash, itching or hives, swelling of the face, lips, or tongue  breathing problems Side effects that usually do not require medical attention (report these to your doctor or health care professional if they continue or are bothersome):  chills  headache  fever  nausea, vomiting  redness, warmth, pain, swelling or itching at site where injected  tiredness This list may not describe all possible side effects. Call your doctor for medical advice about side effects. You may report side effects to FDA at 1-800-FDA-1088. Where should I keep my medicine? This vaccine is only given in a clinic, pharmacy, doctor's office, or other health care setting and will not be stored at home. NOTE: This sheet is a summary. It may not cover all possible information. If you have questions about this medicine, talk to your doctor, pharmacist, or health care provider.  2020 Elsevier/Gold Standard (2017-06-23 13:20:30)

## 2020-07-11 NOTE — Progress Notes (Signed)
Patient presenting for elevated blood pressure and medication management.   Blood pressure was 144/82 today. States she was put on metoprolol 25 mg daily but has not been taking this due to how her heart felt. States this is also why she has stopped the Forteo.   Patient has stopped taking Forteo, Metoprolol, Keflex, and has not yet started taking Doxycycline.  Would like to discuss these medications.

## 2020-07-12 ENCOUNTER — Other Ambulatory Visit (INDEPENDENT_AMBULATORY_CARE_PROVIDER_SITE_OTHER): Payer: Medicare Other

## 2020-07-12 ENCOUNTER — Other Ambulatory Visit: Payer: Self-pay

## 2020-07-12 DIAGNOSIS — E611 Iron deficiency: Secondary | ICD-10-CM | POA: Diagnosis not present

## 2020-07-12 DIAGNOSIS — E785 Hyperlipidemia, unspecified: Secondary | ICD-10-CM

## 2020-07-12 NOTE — Telephone Encounter (Signed)
Notified patient as advised. Patient reports improved symptoms/no symptoms. Patient in agreement with discontinuing ABX.

## 2020-07-12 NOTE — Addendum Note (Signed)
Addended by: Tor Netters I on: 07/12/2020 09:17 AM   Modules accepted: Orders

## 2020-07-13 DIAGNOSIS — R002 Palpitations: Secondary | ICD-10-CM | POA: Diagnosis not present

## 2020-07-13 DIAGNOSIS — Z01818 Encounter for other preprocedural examination: Secondary | ICD-10-CM | POA: Diagnosis not present

## 2020-07-13 DIAGNOSIS — E785 Hyperlipidemia, unspecified: Secondary | ICD-10-CM | POA: Diagnosis not present

## 2020-07-13 DIAGNOSIS — M79604 Pain in right leg: Secondary | ICD-10-CM | POA: Diagnosis not present

## 2020-07-13 DIAGNOSIS — K5790 Diverticulosis of intestine, part unspecified, without perforation or abscess without bleeding: Secondary | ICD-10-CM | POA: Insufficient documentation

## 2020-07-13 DIAGNOSIS — J309 Allergic rhinitis, unspecified: Secondary | ICD-10-CM | POA: Diagnosis not present

## 2020-07-13 DIAGNOSIS — K2100 Gastro-esophageal reflux disease with esophagitis, without bleeding: Secondary | ICD-10-CM | POA: Diagnosis not present

## 2020-07-13 DIAGNOSIS — R0789 Other chest pain: Secondary | ICD-10-CM | POA: Diagnosis not present

## 2020-07-13 DIAGNOSIS — N390 Urinary tract infection, site not specified: Secondary | ICD-10-CM | POA: Diagnosis not present

## 2020-07-13 DIAGNOSIS — R0600 Dyspnea, unspecified: Secondary | ICD-10-CM | POA: Insufficient documentation

## 2020-07-13 DIAGNOSIS — Z853 Personal history of malignant neoplasm of breast: Secondary | ICD-10-CM | POA: Diagnosis not present

## 2020-07-13 DIAGNOSIS — M4327 Fusion of spine, lumbosacral region: Secondary | ICD-10-CM | POA: Diagnosis not present

## 2020-07-13 DIAGNOSIS — M5134 Other intervertebral disc degeneration, thoracic region: Secondary | ICD-10-CM | POA: Diagnosis not present

## 2020-07-13 DIAGNOSIS — K219 Gastro-esophageal reflux disease without esophagitis: Secondary | ICD-10-CM | POA: Insufficient documentation

## 2020-07-13 DIAGNOSIS — R0609 Other forms of dyspnea: Secondary | ICD-10-CM | POA: Insufficient documentation

## 2020-07-13 DIAGNOSIS — M810 Age-related osteoporosis without current pathological fracture: Secondary | ICD-10-CM | POA: Diagnosis not present

## 2020-07-13 DIAGNOSIS — I10 Essential (primary) hypertension: Secondary | ICD-10-CM | POA: Diagnosis not present

## 2020-07-13 DIAGNOSIS — R0602 Shortness of breath: Secondary | ICD-10-CM | POA: Insufficient documentation

## 2020-07-13 DIAGNOSIS — M419 Scoliosis, unspecified: Secondary | ICD-10-CM | POA: Insufficient documentation

## 2020-07-13 DIAGNOSIS — R079 Chest pain, unspecified: Secondary | ICD-10-CM | POA: Insufficient documentation

## 2020-07-13 DIAGNOSIS — Z9189 Other specified personal risk factors, not elsewhere classified: Secondary | ICD-10-CM | POA: Diagnosis not present

## 2020-07-13 DIAGNOSIS — M79605 Pain in left leg: Secondary | ICD-10-CM | POA: Diagnosis not present

## 2020-07-13 DIAGNOSIS — G4700 Insomnia, unspecified: Secondary | ICD-10-CM | POA: Diagnosis not present

## 2020-07-13 DIAGNOSIS — K589 Irritable bowel syndrome without diarrhea: Secondary | ICD-10-CM | POA: Diagnosis not present

## 2020-07-13 LAB — LIPID PANEL
Chol/HDL Ratio: 2.8 ratio (ref 0.0–4.4)
Cholesterol, Total: 208 mg/dL — ABNORMAL HIGH (ref 100–199)
HDL: 74 mg/dL (ref >39)
LDL Chol Calc (NIH): 112 mg/dL — ABNORMAL HIGH (ref 0–99)
Triglycerides: 127 mg/dL (ref 0–149)
VLDL Cholesterol Cal: 22 mg/dL (ref 5–40)

## 2020-07-13 LAB — IRON,TIBC AND FERRITIN PANEL
Ferritin: 112 ng/mL (ref 15–150)
Iron Saturation: 25 % (ref 15–55)
Iron: 82 ug/dL (ref 27–139)
Total Iron Binding Capacity: 331 ug/dL (ref 250–450)
UIBC: 249 ug/dL (ref 118–369)

## 2020-07-21 DIAGNOSIS — Z01818 Encounter for other preprocedural examination: Secondary | ICD-10-CM | POA: Diagnosis not present

## 2020-07-24 DIAGNOSIS — Z428 Encounter for other plastic and reconstructive surgery following medical procedure or healed injury: Secondary | ICD-10-CM | POA: Diagnosis not present

## 2020-07-24 DIAGNOSIS — Z800 Family history of malignant neoplasm of digestive organs: Secondary | ICD-10-CM | POA: Diagnosis not present

## 2020-07-24 DIAGNOSIS — N390 Urinary tract infection, site not specified: Secondary | ICD-10-CM | POA: Diagnosis not present

## 2020-07-24 DIAGNOSIS — M4327 Fusion of spine, lumbosacral region: Secondary | ICD-10-CM | POA: Diagnosis not present

## 2020-07-24 DIAGNOSIS — M4317 Spondylolisthesis, lumbosacral region: Secondary | ICD-10-CM | POA: Diagnosis not present

## 2020-07-24 DIAGNOSIS — M810 Age-related osteoporosis without current pathological fracture: Secondary | ICD-10-CM | POA: Diagnosis not present

## 2020-07-24 DIAGNOSIS — G4700 Insomnia, unspecified: Secondary | ICD-10-CM | POA: Diagnosis not present

## 2020-07-24 DIAGNOSIS — I10 Essential (primary) hypertension: Secondary | ICD-10-CM | POA: Diagnosis not present

## 2020-07-24 DIAGNOSIS — M5134 Other intervertebral disc degeneration, thoracic region: Secondary | ICD-10-CM | POA: Diagnosis not present

## 2020-07-24 DIAGNOSIS — K5900 Constipation, unspecified: Secondary | ICD-10-CM | POA: Diagnosis not present

## 2020-07-24 DIAGNOSIS — R0789 Other chest pain: Secondary | ICD-10-CM | POA: Diagnosis not present

## 2020-07-24 DIAGNOSIS — Z981 Arthrodesis status: Secondary | ICD-10-CM | POA: Diagnosis not present

## 2020-07-24 DIAGNOSIS — J309 Allergic rhinitis, unspecified: Secondary | ICD-10-CM | POA: Diagnosis not present

## 2020-07-24 DIAGNOSIS — M40209 Unspecified kyphosis, site unspecified: Secondary | ICD-10-CM | POA: Diagnosis not present

## 2020-07-24 DIAGNOSIS — R0600 Dyspnea, unspecified: Secondary | ICD-10-CM | POA: Diagnosis not present

## 2020-07-24 DIAGNOSIS — K2100 Gastro-esophageal reflux disease with esophagitis, without bleeding: Secondary | ICD-10-CM | POA: Diagnosis not present

## 2020-07-24 DIAGNOSIS — K219 Gastro-esophageal reflux disease without esophagitis: Secondary | ICD-10-CM | POA: Diagnosis not present

## 2020-07-24 DIAGNOSIS — Z87440 Personal history of urinary (tract) infections: Secondary | ICD-10-CM | POA: Diagnosis not present

## 2020-07-24 DIAGNOSIS — Z833 Family history of diabetes mellitus: Secondary | ICD-10-CM | POA: Diagnosis not present

## 2020-07-24 DIAGNOSIS — Z96611 Presence of right artificial shoulder joint: Secondary | ICD-10-CM | POA: Diagnosis not present

## 2020-07-24 DIAGNOSIS — Z882 Allergy status to sulfonamides status: Secondary | ICD-10-CM | POA: Diagnosis not present

## 2020-07-24 DIAGNOSIS — E876 Hypokalemia: Secondary | ICD-10-CM | POA: Diagnosis not present

## 2020-07-24 DIAGNOSIS — Z8249 Family history of ischemic heart disease and other diseases of the circulatory system: Secondary | ICD-10-CM | POA: Diagnosis not present

## 2020-07-24 DIAGNOSIS — M419 Scoliosis, unspecified: Secondary | ICD-10-CM | POA: Diagnosis not present

## 2020-07-24 DIAGNOSIS — Z801 Family history of malignant neoplasm of trachea, bronchus and lung: Secondary | ICD-10-CM | POA: Diagnosis not present

## 2020-07-24 DIAGNOSIS — Z79899 Other long term (current) drug therapy: Secondary | ICD-10-CM | POA: Diagnosis not present

## 2020-07-24 DIAGNOSIS — E785 Hyperlipidemia, unspecified: Secondary | ICD-10-CM | POA: Diagnosis not present

## 2020-07-24 DIAGNOSIS — R079 Chest pain, unspecified: Secondary | ICD-10-CM | POA: Diagnosis not present

## 2020-07-24 DIAGNOSIS — Z8262 Family history of osteoporosis: Secondary | ICD-10-CM | POA: Diagnosis not present

## 2020-07-24 HISTORY — PX: SPINAL FUSION: SHX223

## 2020-08-01 DIAGNOSIS — R2681 Unsteadiness on feet: Secondary | ICD-10-CM | POA: Diagnosis not present

## 2020-08-01 DIAGNOSIS — Z853 Personal history of malignant neoplasm of breast: Secondary | ICD-10-CM | POA: Diagnosis not present

## 2020-08-01 DIAGNOSIS — Z87440 Personal history of urinary (tract) infections: Secondary | ICD-10-CM | POA: Diagnosis not present

## 2020-08-01 DIAGNOSIS — M810 Age-related osteoporosis without current pathological fracture: Secondary | ICD-10-CM | POA: Diagnosis not present

## 2020-08-01 DIAGNOSIS — Z4789 Encounter for other orthopedic aftercare: Secondary | ICD-10-CM | POA: Diagnosis not present

## 2020-08-01 DIAGNOSIS — K5790 Diverticulosis of intestine, part unspecified, without perforation or abscess without bleeding: Secondary | ICD-10-CM | POA: Diagnosis not present

## 2020-08-01 DIAGNOSIS — M5124 Other intervertebral disc displacement, thoracic region: Secondary | ICD-10-CM | POA: Diagnosis not present

## 2020-08-01 DIAGNOSIS — I10 Essential (primary) hypertension: Secondary | ICD-10-CM | POA: Diagnosis not present

## 2020-08-03 DIAGNOSIS — M810 Age-related osteoporosis without current pathological fracture: Secondary | ICD-10-CM | POA: Diagnosis not present

## 2020-08-03 DIAGNOSIS — K5790 Diverticulosis of intestine, part unspecified, without perforation or abscess without bleeding: Secondary | ICD-10-CM | POA: Diagnosis not present

## 2020-08-03 DIAGNOSIS — R2681 Unsteadiness on feet: Secondary | ICD-10-CM | POA: Diagnosis not present

## 2020-08-03 DIAGNOSIS — I10 Essential (primary) hypertension: Secondary | ICD-10-CM | POA: Diagnosis not present

## 2020-08-03 DIAGNOSIS — M5124 Other intervertebral disc displacement, thoracic region: Secondary | ICD-10-CM | POA: Diagnosis not present

## 2020-08-03 DIAGNOSIS — Z4789 Encounter for other orthopedic aftercare: Secondary | ICD-10-CM | POA: Diagnosis not present

## 2020-08-04 DIAGNOSIS — M5124 Other intervertebral disc displacement, thoracic region: Secondary | ICD-10-CM | POA: Diagnosis not present

## 2020-08-04 DIAGNOSIS — Z4789 Encounter for other orthopedic aftercare: Secondary | ICD-10-CM | POA: Diagnosis not present

## 2020-08-04 DIAGNOSIS — M810 Age-related osteoporosis without current pathological fracture: Secondary | ICD-10-CM | POA: Diagnosis not present

## 2020-08-04 DIAGNOSIS — I10 Essential (primary) hypertension: Secondary | ICD-10-CM | POA: Diagnosis not present

## 2020-08-04 DIAGNOSIS — R2681 Unsteadiness on feet: Secondary | ICD-10-CM | POA: Diagnosis not present

## 2020-08-04 DIAGNOSIS — K5790 Diverticulosis of intestine, part unspecified, without perforation or abscess without bleeding: Secondary | ICD-10-CM | POA: Diagnosis not present

## 2020-08-07 ENCOUNTER — Ambulatory Visit: Payer: Self-pay | Admitting: Urology

## 2020-08-07 DIAGNOSIS — M810 Age-related osteoporosis without current pathological fracture: Secondary | ICD-10-CM | POA: Diagnosis not present

## 2020-08-07 DIAGNOSIS — R2681 Unsteadiness on feet: Secondary | ICD-10-CM | POA: Diagnosis not present

## 2020-08-07 DIAGNOSIS — Z4789 Encounter for other orthopedic aftercare: Secondary | ICD-10-CM | POA: Diagnosis not present

## 2020-08-07 DIAGNOSIS — I10 Essential (primary) hypertension: Secondary | ICD-10-CM | POA: Diagnosis not present

## 2020-08-07 DIAGNOSIS — K5790 Diverticulosis of intestine, part unspecified, without perforation or abscess without bleeding: Secondary | ICD-10-CM | POA: Diagnosis not present

## 2020-08-07 DIAGNOSIS — M5124 Other intervertebral disc displacement, thoracic region: Secondary | ICD-10-CM | POA: Diagnosis not present

## 2020-08-09 DIAGNOSIS — M810 Age-related osteoporosis without current pathological fracture: Secondary | ICD-10-CM | POA: Diagnosis not present

## 2020-08-09 DIAGNOSIS — R2681 Unsteadiness on feet: Secondary | ICD-10-CM | POA: Diagnosis not present

## 2020-08-09 DIAGNOSIS — M5124 Other intervertebral disc displacement, thoracic region: Secondary | ICD-10-CM | POA: Diagnosis not present

## 2020-08-09 DIAGNOSIS — I10 Essential (primary) hypertension: Secondary | ICD-10-CM | POA: Diagnosis not present

## 2020-08-09 DIAGNOSIS — Z4789 Encounter for other orthopedic aftercare: Secondary | ICD-10-CM | POA: Diagnosis not present

## 2020-08-09 DIAGNOSIS — K5790 Diverticulosis of intestine, part unspecified, without perforation or abscess without bleeding: Secondary | ICD-10-CM | POA: Diagnosis not present

## 2020-08-11 DIAGNOSIS — M5124 Other intervertebral disc displacement, thoracic region: Secondary | ICD-10-CM | POA: Diagnosis not present

## 2020-08-11 DIAGNOSIS — M810 Age-related osteoporosis without current pathological fracture: Secondary | ICD-10-CM | POA: Diagnosis not present

## 2020-08-11 DIAGNOSIS — K5790 Diverticulosis of intestine, part unspecified, without perforation or abscess without bleeding: Secondary | ICD-10-CM | POA: Diagnosis not present

## 2020-08-11 DIAGNOSIS — I10 Essential (primary) hypertension: Secondary | ICD-10-CM | POA: Diagnosis not present

## 2020-08-11 DIAGNOSIS — R2681 Unsteadiness on feet: Secondary | ICD-10-CM | POA: Diagnosis not present

## 2020-08-11 DIAGNOSIS — Z4789 Encounter for other orthopedic aftercare: Secondary | ICD-10-CM | POA: Diagnosis not present

## 2020-08-14 DIAGNOSIS — M810 Age-related osteoporosis without current pathological fracture: Secondary | ICD-10-CM | POA: Diagnosis not present

## 2020-08-14 DIAGNOSIS — R2681 Unsteadiness on feet: Secondary | ICD-10-CM | POA: Diagnosis not present

## 2020-08-14 DIAGNOSIS — Z4789 Encounter for other orthopedic aftercare: Secondary | ICD-10-CM | POA: Diagnosis not present

## 2020-08-14 DIAGNOSIS — I10 Essential (primary) hypertension: Secondary | ICD-10-CM | POA: Diagnosis not present

## 2020-08-14 DIAGNOSIS — K5790 Diverticulosis of intestine, part unspecified, without perforation or abscess without bleeding: Secondary | ICD-10-CM | POA: Diagnosis not present

## 2020-08-14 DIAGNOSIS — M5124 Other intervertebral disc displacement, thoracic region: Secondary | ICD-10-CM | POA: Diagnosis not present

## 2020-08-16 DIAGNOSIS — K5790 Diverticulosis of intestine, part unspecified, without perforation or abscess without bleeding: Secondary | ICD-10-CM | POA: Diagnosis not present

## 2020-08-16 DIAGNOSIS — M810 Age-related osteoporosis without current pathological fracture: Secondary | ICD-10-CM | POA: Diagnosis not present

## 2020-08-16 DIAGNOSIS — Z4789 Encounter for other orthopedic aftercare: Secondary | ICD-10-CM | POA: Diagnosis not present

## 2020-08-16 DIAGNOSIS — R2681 Unsteadiness on feet: Secondary | ICD-10-CM | POA: Diagnosis not present

## 2020-08-16 DIAGNOSIS — I10 Essential (primary) hypertension: Secondary | ICD-10-CM | POA: Diagnosis not present

## 2020-08-16 DIAGNOSIS — M5124 Other intervertebral disc displacement, thoracic region: Secondary | ICD-10-CM | POA: Diagnosis not present

## 2020-08-22 DIAGNOSIS — M810 Age-related osteoporosis without current pathological fracture: Secondary | ICD-10-CM | POA: Diagnosis not present

## 2020-08-22 DIAGNOSIS — K5790 Diverticulosis of intestine, part unspecified, without perforation or abscess without bleeding: Secondary | ICD-10-CM | POA: Diagnosis not present

## 2020-08-22 DIAGNOSIS — Z4789 Encounter for other orthopedic aftercare: Secondary | ICD-10-CM | POA: Diagnosis not present

## 2020-08-22 DIAGNOSIS — M5124 Other intervertebral disc displacement, thoracic region: Secondary | ICD-10-CM | POA: Diagnosis not present

## 2020-08-22 DIAGNOSIS — R2681 Unsteadiness on feet: Secondary | ICD-10-CM | POA: Diagnosis not present

## 2020-08-22 DIAGNOSIS — I10 Essential (primary) hypertension: Secondary | ICD-10-CM | POA: Diagnosis not present

## 2020-08-23 DIAGNOSIS — Z23 Encounter for immunization: Secondary | ICD-10-CM | POA: Diagnosis not present

## 2020-08-24 DIAGNOSIS — R2681 Unsteadiness on feet: Secondary | ICD-10-CM | POA: Diagnosis not present

## 2020-08-24 DIAGNOSIS — K5790 Diverticulosis of intestine, part unspecified, without perforation or abscess without bleeding: Secondary | ICD-10-CM | POA: Diagnosis not present

## 2020-08-24 DIAGNOSIS — I10 Essential (primary) hypertension: Secondary | ICD-10-CM | POA: Diagnosis not present

## 2020-08-24 DIAGNOSIS — Z4789 Encounter for other orthopedic aftercare: Secondary | ICD-10-CM | POA: Diagnosis not present

## 2020-08-24 DIAGNOSIS — M810 Age-related osteoporosis without current pathological fracture: Secondary | ICD-10-CM | POA: Diagnosis not present

## 2020-08-24 DIAGNOSIS — M5124 Other intervertebral disc displacement, thoracic region: Secondary | ICD-10-CM | POA: Diagnosis not present

## 2020-08-25 DIAGNOSIS — R2681 Unsteadiness on feet: Secondary | ICD-10-CM | POA: Diagnosis not present

## 2020-08-25 DIAGNOSIS — M5124 Other intervertebral disc displacement, thoracic region: Secondary | ICD-10-CM | POA: Diagnosis not present

## 2020-08-25 DIAGNOSIS — M810 Age-related osteoporosis without current pathological fracture: Secondary | ICD-10-CM | POA: Diagnosis not present

## 2020-08-25 DIAGNOSIS — I10 Essential (primary) hypertension: Secondary | ICD-10-CM | POA: Diagnosis not present

## 2020-08-25 DIAGNOSIS — K5790 Diverticulosis of intestine, part unspecified, without perforation or abscess without bleeding: Secondary | ICD-10-CM | POA: Diagnosis not present

## 2020-08-25 DIAGNOSIS — Z4789 Encounter for other orthopedic aftercare: Secondary | ICD-10-CM | POA: Diagnosis not present

## 2020-08-28 ENCOUNTER — Ambulatory Visit: Payer: Medicare Other | Admitting: Urology

## 2020-09-04 DIAGNOSIS — M2560 Stiffness of unspecified joint, not elsewhere classified: Secondary | ICD-10-CM | POA: Diagnosis not present

## 2020-09-06 DIAGNOSIS — M4327 Fusion of spine, lumbosacral region: Secondary | ICD-10-CM | POA: Diagnosis not present

## 2020-09-06 DIAGNOSIS — M4727 Other spondylosis with radiculopathy, lumbosacral region: Secondary | ICD-10-CM | POA: Diagnosis not present

## 2020-09-06 DIAGNOSIS — M25551 Pain in right hip: Secondary | ICD-10-CM | POA: Diagnosis not present

## 2020-09-06 DIAGNOSIS — M7061 Trochanteric bursitis, right hip: Secondary | ICD-10-CM | POA: Diagnosis not present

## 2020-09-06 DIAGNOSIS — S76011A Strain of muscle, fascia and tendon of right hip, initial encounter: Secondary | ICD-10-CM | POA: Diagnosis not present

## 2020-09-06 DIAGNOSIS — G8929 Other chronic pain: Secondary | ICD-10-CM | POA: Diagnosis not present

## 2020-09-07 DIAGNOSIS — M4324 Fusion of spine, thoracic region: Secondary | ICD-10-CM | POA: Diagnosis not present

## 2020-09-07 DIAGNOSIS — M4326 Fusion of spine, lumbar region: Secondary | ICD-10-CM | POA: Diagnosis not present

## 2020-09-07 DIAGNOSIS — M542 Cervicalgia: Secondary | ICD-10-CM | POA: Diagnosis not present

## 2020-09-07 DIAGNOSIS — Z981 Arthrodesis status: Secondary | ICD-10-CM | POA: Diagnosis not present

## 2020-09-08 ENCOUNTER — Other Ambulatory Visit: Payer: Self-pay | Admitting: Neurosurgery

## 2020-09-08 ENCOUNTER — Telehealth: Payer: Self-pay | Admitting: *Deleted

## 2020-09-08 DIAGNOSIS — M4802 Spinal stenosis, cervical region: Secondary | ICD-10-CM

## 2020-09-08 NOTE — Telephone Encounter (Signed)
Attempted to call patient. No answer. Left message to call us back if needed.

## 2020-09-08 NOTE — Telephone Encounter (Signed)
Patient called back and was inquiring about her appt with pain in neck. She does have a future appt for a MRI . She will come and talk with Dr Maria Spencer to hear some of his options.

## 2020-09-11 DIAGNOSIS — M2560 Stiffness of unspecified joint, not elsewhere classified: Secondary | ICD-10-CM | POA: Diagnosis not present

## 2020-09-12 ENCOUNTER — Ambulatory Visit (INDEPENDENT_AMBULATORY_CARE_PROVIDER_SITE_OTHER): Payer: Medicare Other

## 2020-09-12 VITALS — Ht 59.0 in | Wt 126.0 lb

## 2020-09-12 DIAGNOSIS — Z0000 Encounter for general adult medical examination without abnormal findings: Secondary | ICD-10-CM

## 2020-09-12 NOTE — Progress Notes (Addendum)
Subjective:   Maria Spencer is a 74 y.o. female who presents for Medicare Annual (Subsequent) preventive examination.  Review of Systems    No ROS.  Medicare Wellness Virtual Visit.    Cardiac Risk Factors include: advanced age (>65men, >61 women);hypertension     Objective:    Today's Vitals   09/12/20 0931  Weight: 126 lb (57.2 kg)  Height: 4\' 11"  (1.499 m)   Body mass index is 25.45 kg/m.  Advanced Directives 09/12/2020 05/15/2020 05/11/2020 05/08/2020 09/10/2019 10/09/2018 10/09/2018  Does Patient Have a Medical Advance Directive? Yes Yes Yes Yes Yes Yes -  Type of Paramedic of [place];Living will [place];Living will [place];Living will Living will;Healthcare Power of [place];Living will - Living will;Healthcare Power of Attorney  Does patient want to make changes to medical advance directive? No - Patient declined - - - No - Patient declined No - Patient declined -  Copy of [place] in Chart? Yes - validated most recent copy scanned in chart (See row information) No - copy requested - - No - copy requested - No - copy requested    Current Medications (verified) Outpatient Encounter Medications as of 09/12/2020  Medication Sig  . gabapentin (NEURONTIN) 100 MG capsule Take by mouth.  Marland Kitchen atorvastatin (LIPITOR) 10 MG tablet Take 1 tablet (10 mg total) by mouth every other day. Note 10 mg no need to cut in 1/2 as before you have 20 mg taking 1/2 pill =10 mg  . Cholecalciferol 1.25 MG (50000 UT) capsule Take 1 capsule (50,000 Units total) by mouth once a week.  . fluticasone (FLONASE) 50 MCG/ACT nasal spray Place 2 sprays into both nostrils daily. Max b/l nostrils  . FORTEO 600 MCG/2.4ML SOPN  (Patient not taking: Reported on 07/11/2020)  . gabapentin (NEURONTIN) 100 MG capsule Take by mouth.  . levothyroxine (SYNTHROID) 88 MCG tablet Take 1 tablet (88 mcg total) by  mouth daily before breakfast. 30 minutes  . lisinopril (ZESTRIL) 40 MG tablet Take 1 tablet (40 mg total) by mouth daily.  . metoprolol succinate (TOPROL-XL) 25 MG 24 hr tablet Take 1 tablet (25 mg total) by mouth daily.  . Multiple Vitamin (MULTI-VITAMINS) TABS Take 1 tablet by mouth daily.   . nitroGLYCERIN (NITROSTAT) 0.4 MG SL tablet Place under the tongue.  Marland Kitchen zolpidem (AMBIEN) 5 MG tablet Take 1 tablet (5 mg total) by mouth at bedtime as needed for sleep.  . [DISCONTINUED] metoprolol tartrate (LOPRESSOR) 25 MG tablet Take 25 mg by mouth daily in the afternoon. (Patient not taking: Reported on 07/11/2020)   No facility-administered encounter medications on file as of 09/12/2020.    Allergies (verified) Sulfa antibiotics, Nitrofuran derivatives, Amlodipine besylate, and Nitrofurantoin   History: Past Medical History:  Diagnosis Date  . Arthritis    neck, back, left knee;   . Breast cancer [facility]) 2009   right lumpectomy s/p radiation   . Chicken pox   . Chronic UTI    established with urology   . Diverticular disease    -osis and -itis   . Esophagitis    egd 05/17/14 see report scanned into chart  . Hormone disorder   . Hyperlipidemia   . Hypertension    controlled well with medication;   . Personal history of radiation therapy 2009   F/U right breast cancer  . Thyroid disease    hypothyroidism    Past Surgical History:  Procedure Laterality  Date  . BONE GRAFT HIP ILIAC CREST     + cage left hip 10/23/17   . BREAST BIOPSY Left 2009   clip,benign  . BREAST BIOPSY Right 2009   +  . BREAST LUMPECTOMY Right 2009   2009 lumpectomy   . CARDIAC CATHETERIZATION     No stents; [place] doesn't recall facility +74yrs ago  . COLONOSCOPY WITH PROPOFOL N/A 05/15/2020   Procedure: COLONOSCOPY WITH PROPOFOL;  Surgeon: Lin Landsman, MD;  Location: [facility] ENDOSCOPY;  Service: Gastroenterology;  Laterality: N/A;  . EYE SURGERY     b/l cataracts sch 10/2019 in [place]  .  INSERTION OF MESH N/A 05/29/2018   Procedure: INSERTION OF MESH;  Surgeon: Johnathan Hausen, MD;  Location: [facility] ORS;  Service: General;  Laterality: N/A;  . SHOULDER SURGERY     x2 surgeries both shoulders, right shoulder replacement last in 2012   . SPINE SURGERY  10/23/2017   spinal 09/2017 h/o scoliosis [facility] L4-S1 OLIF T10 to ililum fusion  . TONSILLECTOMY     age 76 y.o.   . TOTAL SHOULDER REPLACEMENT    . VENTRAL HERNIA REPAIR N/A 05/29/2018   Procedure: LAPAROSCOPIC VENTRAL / [place];  Surgeon: Johnathan Hausen, MD;  Location: [facility] ORS;  Service: General;  Laterality: N/A;   Family History  Problem Relation Age of Onset  . Arthritis Mother   . Heart disease Mother 40  . Hyperlipidemia Mother   . Hypertension Mother   . Coronary artery disease Mother 1  . Arthritis Father   . Diabetes Father   . Cancer Father        colon  . AAA (abdominal aortic aneurysm) Father   . Arthritis Sister   . Hyperlipidemia Sister   . Hypertension Sister   . Cancer Brother        lung, smoker  . Mental retardation Brother   . Drug abuse Daughter        overdose in 2018   . Arthritis Sister   . Hyperlipidemia Sister   . Bladder Cancer Neg Hx   . Kidney cancer Neg Hx   . Breast cancer Neg Hx    Social History   Socioeconomic History  . Marital status: Widowed    Spouse name: Not on file  . Number of children: Not on file  . Years of education: Not on file  . Highest education level: Not on file  Occupational History  . Not on file  Tobacco Use  . Smoking status: Never Smoker  . Smokeless tobacco: Never Used  Vaping Use  . Vaping Use: Never used  Substance and Sexual Activity  . Alcohol use: Not Currently    Alcohol/week: 7.0 standard drinks    Types: 7 Glasses of wine per week  . Drug use: No  . Sexual activity: Yes    Birth control/protection: Post-menopausal  Other Topics Concern  . Not on file  Social History Narrative   College grad   widowed husband died 2019/07/18     Wears seatbelt and safe in relationship    3 kids       [place]   Social Determinants of Health   Financial Resource Strain: Low Risk   . Difficulty of Paying Living Expenses: Not hard at all  Food Insecurity: No Food Insecurity  . Worried About Charity fundraiser in the Last Year: Never true  . Ran Out of Food in the Last Year: Never true  Transportation  Needs: No Transportation Needs  . Lack of Transportation (Medical): No  . Lack of Transportation (Non-Medical): No  Physical Activity: Insufficiently Active  . Days of Exercise per Week: 2 days  . Minutes of Exercise per Session: 60 min  Stress:   . Feeling of Stress : Not on file  Social Connections: Unknown  . Frequency of Communication with Friends and Family: More than three times a week  . Frequency of Social Gatherings with Friends and Family: Not on file  . Attends Religious Services: Not on file  . Active Member of Clubs or Organizations: Yes  . Attends Archivist Meetings: More than 4 times per year  . Marital Status: Widowed    Tobacco Counseling Counseling given: Not Answered   Clinical Intake:  Pre-visit preparation completed: Yes        Diabetes: No  How often do you need to have someone help you when you read instructions, pamphlets, or other written materials from your doctor or pharmacy?: 1 - Never Interpreter Needed?: No      Activities of Daily Living In your present state of health, do you have any difficulty performing the following activities: 09/12/2020  Hearing? N  Vision? N  Difficulty concentrating or making decisions? N  Walking or climbing stairs? N  Dressing or bathing? N  Doing errands, shopping? N  Preparing Food and eating ? N  Using the Toilet? N  In the past six months, have you accidently leaked urine? N  Do you have problems with loss of bowel control? N  Managing your Medications? N  Managing your Finances? N  Housekeeping or managing your  Housekeeping? N  Some recent data might be hidden    Patient Care Team: McLean-Scocuzza, Nino Glow, MD as PCP - General (Internal Medicine) Johnathan Hausen, MD as Consulting Physician (General Surgery) Kaleen Mask, NP as Nurse Practitioner (Neurosurgery) [facility], Sonda Rumble, MD as Consulting Physician (Neurosurgery) Earl Many, MD as Consulting Physician (Urology)  Indicate any recent [place] you may have received from other than [facility] providers in the past year (date may be approximate).     Assessment:   This is a routine wellness examination for B[place].  I connected with Avalyn today by telephone and verified that I am speaking with the correct person using two identifiers. Location patient: home Location provider: work Persons participating in the virtual visit: patient, Marine scientist.    I discussed the limitations, risks, security and privacy concerns of performing an evaluation and management service by telephone and the availability of in person appointments. The patient expressed understanding and verbally consented to this telephonic visit.    Interactive audio and video telecommunications were attempted between this provider and patient, however failed, due to patient having technical difficulties OR patient did not have access to video capability.  We continued and completed visit with audio only.  Some vital signs may be absent or patient reported.   Hearing/Vision screen  Hearing Screening   125Hz  250Hz  500Hz  1000Hz  2000Hz  3000Hz  4000Hz  6000Hz  8000Hz   Right ear:           Left ear:           Comments: Patient is able to hear conversational tones without difficulty. No issues reported.  Vision Screening Comments: Visual acuity not assessed, virtual visit.   Dietary issues and exercise activities discussed: Current Exercise Habits: Home exercise routine, Time (Minutes): 60, Frequency (Times/Week): 2, Weekly Exercise (Minutes/Week): 120, Intensity: Mild   Healthy diet Good water  intake  Goals      Patient Stated   .  Maintain Healthy Lifestyle (pt-stated)      Stay active Eat a healthy diet       Depression Screen PHQ 2/9 Scores 09/12/2020 06/06/2020 05/11/2020 05/08/2020 01/14/2020 09/10/2019 09/08/2018  PHQ - 2 Score 0 0 0 0 0 0 2  PHQ- 9 Score - - - - - - 11    Fall Risk Fall Risk  09/12/2020 07/11/2020 06/06/2020 05/11/2020 05/08/2020  Falls in the past year? 0 0 0 0 1  Number falls in past yr: 0 0 - - 1  Injury with Fall? - 0 - - 1  Comment - - - - fell on right hip  Risk for fall due to : - - - - Other (Comment)  Risk for fall due to: Comment - - - - fell on slippery surface.  Follow up Falls evaluation completed Falls evaluation completed - - -   Handrails in use when climbing stairs? Yes Home free of loose throw rugs in walkways, pet beds, electrical cords, etc? Yes  Adequate lighting in your home to reduce risk of falls? Yes   ASSISTIVE DEVICES UTILIZED TO PREVENT FALLS: Use of a cane, walker or w/c? Yes  Grab bars in the bathroom? Yes  Shower chair or bench in shower? Yes   TIMED UP AND GO: Was the test performed? No . Virtual visit.   Cognitive Function:  Patient is alert and oriented x3.  Denies difficulty focusing, making decisions, memory loss.  Enjoys reading and crossword puzzles for brain stimulation.    6CIT Screen 09/08/2018  What Year? 0 points  What month? 0 points  What time? 0 points  Count back from 20 0 points  Months in reverse 0 points  Repeat phrase 0 points  Total Score 0    Immunizations Immunization History  Administered Date(s) Administered  . Influenza Split 11/25/2005, 08/25/2012  . Influenza, High Dose Seasonal PF 08/13/2016, 08/02/2017, 08/07/2018, 08/09/2019, 08/29/2020  . Influenza,inj,Quad PF,6+ Mos 08/25/2014, 08/26/2015  . Influenza-Unspecified 08/25/2017  . PFIZER SARS-COV-2 Vaccination 12/24/2019, 01/07/2020, 08/29/2020  . Pneumococcal Conjugate-13 01/30/2015,  12/29/2017  . Pneumococcal Polysaccharide-23 11/26/2011, 12/09/2011, 10/10/2018  . Tdap 12/09/2011  . Zoster 11/26/2010   Health Maintenance Health Maintenance  Topic Date Due  . Samul Dada  12/08/2021  . MAMMOGRAM  06/05/2022  . COLONOSCOPY  05/16/2027  . INFLUENZA VACCINE  Completed  . DEXA SCAN  Completed  . COVID-19 Vaccine  Completed  . Hepatitis C Screening  Completed  . PNA vac Low Risk Adult  Completed    Dental Screening: Recommended annual dental exams for proper oral hygiene  Community Resource Referral / Chronic Care Management: CRR required this visit?  No   CCM required this visit?  No      Plan:   Keep all routine maintenance appointments.   Follow up 01/11/21 @ 9:30  I have personally reviewed and noted the following in the patient's chart:   . Medical and social history . Use of alcohol, tobacco or illicit drugs  . Current medications and supplements . Functional ability and status . Nutritional status . Physical activity . Advanced directives . List of other physicians . Hospitalizations, surgeries, and ER visits in previous 12 months . Vitals . Screenings to include cognitive, depression, and falls . Referrals and appointments  In addition, I have reviewed and discussed with patient certain preventive protocols, quality metrics, and best practice recommendations. A written personalized care plan for  preventive services as well as general preventive health recommendations were provided to patient via mychart.     Varney Biles, LPN   [phone]

## 2020-09-12 NOTE — Patient Instructions (Addendum)
Maria Spencer , Thank you for taking time to come for your Medicare Wellness Visit. I appreciate your ongoing commitment to your health goals. Please review the following plan we discussed and let me know if I can assist you in the future.   These are the goals we discussed: Goals      Patient Stated   .  Maintain Healthy Lifestyle (pt-stated)      Stay active Eat a healthy diet        This is a list of the screening recommended for you and due dates:  Health Maintenance  Topic Date Due  . Tetanus Vaccine  12/08/2021  . Mammogram  06/05/2022  . Colon Cancer Screening  05/16/2027  . Flu Shot  Completed  . DEXA scan (bone density measurement)  Completed  . COVID-19 Vaccine  Completed  .  Hepatitis C: One time screening is recommended by Center for Disease Control  (CDC) for  adults born from 18 through 1965.   Completed  . Pneumonia vaccines  Completed    Immunizations Immunization History  Administered Date(s) Administered  . Influenza Split 11/25/2005, 08/25/2012  . Influenza, High Dose Seasonal PF 08/13/2016, 08/02/2017, 08/07/2018, 08/09/2019  . Influenza,inj,Quad PF,6+ Mos 08/25/2014, 08/26/2015  . Influenza-Unspecified 08/25/2017  . PFIZER SARS-COV-2 Vaccination 12/24/2019, 01/07/2020  . Pneumococcal Conjugate-13 01/30/2015, 12/29/2017  . Pneumococcal Polysaccharide-23 11/26/2011, 12/09/2011, 10/10/2018  . Tdap 12/09/2011  . Zoster 11/26/2010   Keep all routine maintenance appointments.   Follow up 01/11/21 @ 9:30  Advanced directives: completed  Conditions/risks identified: none new  Follow up in one year for your annual wellness visit.   Preventive Care 28 Years and Older, Female Preventive care refers to lifestyle choices and visits with your health care provider that can promote health and wellness. What does preventive care include?  A yearly physical exam. This is also called an annual well check.  Dental exams once or twice a year.  Routine eye  exams. Ask your health care provider how often you should have your eyes checked.  Personal lifestyle choices, including:  Daily care of your teeth and gums.  Regular physical activity.  Eating a healthy diet.  Avoiding tobacco and drug use.  Limiting alcohol use.  Practicing safe sex.  Taking low-dose aspirin every day.  Taking vitamin and mineral supplements as recommended by your health care provider. What happens during an annual well check? The services and screenings done by your health care provider during your annual well check will depend on your age, overall health, lifestyle risk factors, and family history of disease. Counseling  Your health care provider may ask you questions about your:  Alcohol use.  Tobacco use.  Drug use.  Emotional well-being.  Home and relationship well-being.  Sexual activity.  Eating habits.  History of falls.  Memory and ability to understand (cognition).  Work and work Statistician.  Reproductive health. Screening  You may have the following tests or measurements:  Height, weight, and BMI.  Blood pressure.  Lipid and cholesterol levels. These may be checked every 5 years, or more frequently if you are over 65 years old.  Skin check.  Lung cancer screening. You may have this screening every year starting at age 32 if you have a 30-pack-year history of smoking and currently smoke or have quit within the past 15 years.  Fecal occult blood test (FOBT) of the stool. You may have this test every year starting at age 29.  Flexible sigmoidoscopy or colonoscopy. You may  have a sigmoidoscopy every 5 years or a colonoscopy every 10 years starting at age 38.  Hepatitis C blood test.  Hepatitis B blood test.  Sexually transmitted disease (STD) testing.  Diabetes screening. This is done by checking your blood sugar (glucose) after you have not eaten for a while (fasting). You may have this done every 1-3 years.  Bone  density scan. This is done to screen for osteoporosis. You may have this done starting at age 43.  Mammogram. This may be done every 1-2 years. Talk to your health care provider about how often you should have regular mammograms. Talk with your health care provider about your test results, treatment options, and if necessary, the need for more tests. Vaccines  Your health care provider may recommend certain vaccines, such as:  Influenza vaccine. This is recommended every year.  Tetanus, diphtheria, and acellular pertussis (Tdap, Td) vaccine. You may need a Td booster every 10 years.  Zoster vaccine. You may need this after age 25.  Pneumococcal 13-valent conjugate (PCV13) vaccine. One dose is recommended after age 6.  Pneumococcal polysaccharide (PPSV23) vaccine. One dose is recommended after age 35. Talk to your health care provider about which screenings and vaccines you need and how often you need them. This information is not intended to replace advice given to you by your health care provider. Make sure you discuss any questions you have with your health care provider. Document Released: 12/08/2015 Document Revised: 07/31/2016 Document Reviewed: 09/12/2015 Elsevier Interactive Patient Education  2017 [employer] Prevention in the Home Falls can cause injuries. They can happen to people of all ages. There are many things you can do to make your home safe and to help prevent falls. What can I do on the outside of my home?  Regularly fix the edges of walkways and driveways and fix any cracks.  Remove anything that might make you trip as you walk through a door, such as a raised step or threshold.  Trim any bushes or trees on the path to your home.  Use bright outdoor lighting.  Clear any walking paths of anything that might make someone trip, such as rocks or tools.  Regularly check to see if handrails are loose or broken. Make sure that both sides of any steps have  handrails.  Any raised decks and porches should have guardrails on the edges.  Have any leaves, snow, or ice cleared regularly.  Use sand or salt on walking paths during winter.  Clean up any spills in your garage right away. This includes oil or grease spills. What can I do in the bathroom?  Use night lights.  Install grab bars by the toilet and in the tub and shower. Do not use towel bars as grab bars.  Use non-skid mats or decals in the tub or shower.  If you need to sit down in the shower, use a plastic, non-slip stool.  Keep the floor dry. Clean up any water that spills on the floor as soon as it happens.  Remove soap buildup in the tub or shower regularly.  Attach bath mats securely with double-sided non-slip rug tape.  Do not have throw rugs and other things on the floor that can make you trip. What can I do in the bedroom?  Use night lights.  Make sure that you have a light by your bed that is easy to reach.  Do not use any sheets or blankets that are too big for your  bed. They should not hang down onto the floor.  Have a firm chair that has side arms. You can use this for support while you get dressed.  Do not have throw rugs and other things on the floor that can make you trip. What can I do in the kitchen?  Clean up any spills right away.  Avoid walking on wet floors.  Keep items that you use a lot in easy-to-reach places.  If you need to reach something above you, use a strong step stool that has a grab bar.  Keep electrical cords out of the way.  Do not use floor polish or wax that makes floors slippery. If you must use wax, use non-skid floor wax.  Do not have throw rugs and other things on the floor that can make you trip. What can I do with my stairs?  Do not leave any items on the stairs.  Make sure that there are handrails on both sides of the stairs and use them. Fix handrails that are broken or loose. Make sure that handrails are as long as  the stairways.  Check any carpeting to make sure that it is firmly attached to the stairs. Fix any carpet that is loose or worn.  Avoid having throw rugs at the top or bottom of the stairs. If you do have throw rugs, attach them to the floor with carpet tape.  Make sure that you have a light switch at the top of the stairs and the bottom of the stairs. If you do not have them, ask someone to add them for you. What else can I do to help prevent falls?  Wear shoes that:  Do not have high heels.  Have rubber bottoms.  Are comfortable and fit you well.  Are closed at the toe. Do not wear sandals.  If you use a stepladder:  Make sure that it is fully opened. Do not climb a closed stepladder.  Make sure that both sides of the stepladder are locked into place.  Ask someone to hold it for you, if possible.  Clearly mark and make sure that you can see:  Any grab bars or handrails.  First and last steps.  Where the edge of each step is.  Use tools that help you move around (mobility aids) if they are needed. These include:  Canes.  Walkers.  Scooters.  Crutches.  Turn on the lights when you go into a dark area. Replace any light bulbs as soon as they burn out.  Set up your furniture so you have a clear path. Avoid moving your furniture around.  If any of your floors are uneven, fix them.  If there are any pets around you, be aware of where they are.  Review your medicines with your doctor. Some medicines can make you feel dizzy. This can increase your chance of falling. Ask your doctor what other things that you can do to help prevent falls. This information is not intended to replace advice given to you by your health care provider. Make sure you discuss any questions you have with your health care provider. Document Released: 09/07/2009 Document Revised: 04/18/2016 Document Reviewed: 12/16/2014 Elsevier Interactive Patient Education  2017 [employer].

## 2020-09-12 NOTE — Progress Notes (Signed)
PROVIDER NOTE: Information contained herein reflects review and annotations entered in association with encounter. Interpretation of such information and data should be left to medically-trained personnel. Information provided to patient can be located elsewhere in the medical record under "Patient Instructions". Document created using STT-dictation technology, any transcriptional errors that may result from process are unintentional.    Patient: Maria Spencer  Service Category: E/M  Provider: Gaspar Cola, MD  DOB: 29-May-1946  DOS: 09/13/2020  Specialty: Interventional Pain Management  MRN: [id number]  Setting: Ambulatory outpatient  PCP: McLean-Scocuzza, Nino Glow, MD  Type: Established Patient    Referring Provider: Orland Mustard *  Location: Office  Delivery: Face-to-face     HPI  [name], a 74 y.o. year old female, is here today because of her Chronic pain syndrome [G89.4]. Ms. Bacus primary complain today is Neck Pain and Shoulder Pain (bilateral) Last encounter: [name] was on 06/25/2020. 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/20/2017); Chronic hip pain (2ry area of Pain) (Bilateral) (R>L); Mid back pain; Chronic low back pain with sciatica; Chronic thoracic back pain (1ry area of Pain) (Bilateral) (R>L); Fusion of spine of lumbosacral region; Lumbosacral spondylosis with radiculopathy; Chronic pain syndrome; Chronic lower extremity pain (3ry area of Pain) (Right); Chronic knee pain (4th area of Pain) (Posterior) (Left); Thoracic facet syndrome (Bilateral); Chronic sacroiliac joint pain (Bilateral); Spondylosis without myelopathy or radiculopathy, thoracic region; Closed wedge compression fracture of T8 vertebra ([place]); Osteoarthritis of hips  (Bilateral); Abnormal thoracolumbar CT myelogram (01/05/2020); Breast cancer ([place]); 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 Pain Assessment: Severity of Chronic pain is reported as a 4 /10. Location: Neck  /pins and needles in right arm and headaches. Onset: More than a month ago. Quality: Sharp, Pins and needles. Timing: Constant. Modifying factor(s): denies. Vitals:  height is $RemoveB'4\' 11"'AuHBvhwA$  (1.499 m) and weight is 126 lb (57.2 kg). Her temperature is 96.9 F (36.1 C) (abnormal). Her blood pressure is 159/70 (abnormal) and her pulse is 91. Her respiration is 16 and oxygen saturation is 100%.   Reason for encounter: worsening of previously known (established) problem.  Currently I am not prescribing any medications for this patient.  The patient indicates that since her last visit, she had a fusion of the upper portion of her thoracic spine in August and although that pain is better, since the fusion she has developed neck pain, upper back pain, bilateral shoulder pain, and pain going to the right upper extremity all the way into all of her fingers but the worst is the middle finger (C7 dermatomal distribution).  She recently had some x-rays of the cervical spine done at a [facility] facility which indicate that the patient has degenerative disc disease of the cervical spine primarily affecting the C4-5, C5-6, and C7-T1 area.  The patient has been seen by [name] for this problem.  He has order a cervical spine MRI that is scheduled for November 3.  At this point, based on the  patient's clinical  symptoms I have ordered a right-sided cervical epidural steroid injection under fluoroscopic guidance to see if we can help her with some of this pain.  The patient has elected to have the procedure done without sedation since she refers not having anybody to drive her.  Physical exam today showed exquisite tenderness to palpation over the upper back and lower neck region.    Pharmacotherapy Assessment   Analgesic: None Highest recorded MME/day: 75 mg/day MME/day: 0 mg/day   Monitoring: [place] PMP: PDMP reviewed during this encounter.       Pharmacotherapy: No side-effects or adverse reactions reported. Compliance: No problems identified. Effectiveness: Clinically acceptable.  Dewayne Shorter, RN  09/13/2020 10:56 AM  Signed Safety precautions to be maintained throughout the outpatient stay will include: orient to surroundings, keep bed in low position, maintain call bell within reach at all times, provide assistance with transfer out of bed and ambulation.    UDS: No results found for: SUMMARY   ROS  Constitutional: Denies any fever or chills Gastrointestinal: No reported hemesis, hematochezia, vomiting, or acute GI distress Musculoskeletal: Denies any acute onset joint swelling, redness, loss of ROM, or weakness Neurological: No reported episodes of acute onset apraxia, aphasia, dysarthria, agnosia, amnesia, paralysis, loss of coordination, or loss of consciousness  Medication Review  Multi-Vitamins, Teriparatide (Recombinant), acetaminophen, atorvastatin, baclofen, ergocalciferol, fluticasone, gabapentin, levothyroxine, lisinopril, metoprolol succinate, nitroGLYCERIN, oxyCODONE, pantoprazole, traMADol, and zolpidem  History Review  Allergy: Maria Spencer is allergic to sulfa antibiotics, nitrofuran derivatives, amlodipine besylate, and nitrofurantoin. Drug: Maria Spencer  reports no history of drug use. Alcohol:  reports previous alcohol use of about 7.0 standard drinks of  alcohol per week. Tobacco:  reports that she has never smoked. She has never used smokeless tobacco. Social: Maria Spencer  reports that she has never smoked. She has never used smokeless tobacco. She reports previous alcohol use of about 7.0 standard drinks of alcohol per week. She reports that she does not use drugs. Medical:  has a past medical history of Arthritis, Breast cancer ([place]) (2009), Chicken pox, Chronic UTI, Diverticular disease, Esophagitis, Hormone disorder, Hyperlipidemia, Hypertension, Personal history of radiation therapy (2009), and Thyroid disease. Surgical: Ms. Schrodt  has a past surgical history that includes Total shoulder replacement; Shoulder surgery; Tonsillectomy; Bone graft hip iliac crest; Spine surgery (10/23/2017); Ventral hernia repair (N/A, 05/29/2018); Insertion of mesh (N/A, 05/29/2018); Eye surgery; Cardiac catheterization; Breast lumpectomy (Right, 2009); Breast biopsy (Left, 2009); Breast biopsy (Right, 2009); Colonoscopy with propofol (N/A, 05/15/2020); and Spinal fusion. Family: family history includes AAA (abdominal aortic aneurysm) in her father; Arthritis in her father, mother, sister, and sister; Cancer in her brother and father; Coronary artery disease (age of onset: 80) in her mother; Diabetes in her father; Drug abuse in her daughter; Heart disease (age of onset: 55) in her mother; Hyperlipidemia in her mother, sister, and sister; Hypertension in her mother and sister; Mental retardation in her brother.  Laboratory Chemistry Profile   Renal Lab Results  Component Value Date   BUN 24 (H) 05/08/2020   CREATININE 0.54 05/08/2020   BCR 28 (H) 09/10/2019   GFR 63.81 09/22/2018   GFRAA >60 05/08/2020   GFRNONAA >60 05/08/2020     Hepatic Lab Results  Component Value Date   AST 19 05/08/2020   ALT 15 05/08/2020   ALBUMIN 4.4 05/08/2020   ALKPHOS 97 05/08/2020   LIPASE 23 05/28/2018     Electrolytes Lab Results  Component Value Date   NA 138 05/08/2020  K 4.0 05/08/2020   CL 102 05/08/2020   CALCIUM 9.5 05/08/2020   MG 2.0 05/08/2020     Bone Lab Results  Component Value Date   VD25OH 19.57 (L) 05/08/2020     Inflammation (CRP: Acute Phase) (ESR: Chronic Phase) Lab Results  Component Value Date   CRP 0.6 05/08/2020   ESRSEDRATE 5 05/08/2020       Note: Above Lab results reviewed.  Recent Imaging Review  CT THORACIC SPINE WO CONTRAST CLINICAL DATA:  Mid back pain  EXAM: CT THORACIC SPINE WITHOUT CONTRAST  TECHNIQUE: Multidetector CT images of the thoracic were obtained using the standard protocol without intravenous contrast.  COMPARISON:  01/05/2020 myelogram  FINDINGS: Alignment: Stable.  Vertebrae: Partially imaged posterior fusion hardware with rod and pedicle fixation extending caudally T9 with associated streak artifact. Appearance is stable without evidence of complication. Vertebral body heights are unchanged.  Paraspinal and other soft tissues: No new abnormality.  Disc levels: Marked disc height loss with endplate irregularity and sclerosis again identified at T8-T9. Disc bulge/protrusion at this level on myelogram cannot be evaluated.  IMPRESSION: Stable appearance of partially imaged posterior fusion. Adjacent level changes at T8-T9 appears similar to prior myelogram although the spinal canal is obscured at this level.  Electronically Signed   By: Macy Mis M.D.   On: 06/23/2020 08:23 Note: Reviewed        Physical Exam  General appearance: Well nourished, well developed, and well hydrated. In no apparent acute distress Mental status: Alert, oriented x 3 (person, place, & time)       Respiratory: No evidence of acute respiratory distress Eyes: PERLA Vitals: BP (!) 159/70   Pulse 91   Temp (!) 96.9 F (36.1 C)   Resp 16   Ht $R'4\' 11"'ss$  (1.499 m)   Wt 126 lb (57.2 kg)   SpO2 100%   BMI 25.45 kg/m  BMI: Estimated body mass index is 25.45 kg/m as calculated from the following:    Height as of this encounter: $RemoveBeforeD'4\' 11"'QoEGgtPXyHsMIh$  (1.499 m).   Weight as of this encounter: 126 lb (57.2 kg). Ideal: Patient must be at least 60 in tall to calculate ideal body weight  Assessment   Status Diagnosis  Reoccurring Worsening Worsened 1. Chronic pain syndrome   2. Cervicalgia   3. Cervical spondylosis with radiculopathy (Right)   4. Cervical radiculopathy at C7 (Right)   5. Chronic thoracic back pain (1ry area of Pain) (Bilateral) (R>L)   6. Chronic hip pain (2ry area of Pain) (Bilateral) (R>L)   7. Chronic lower extremity pain (3ry area of Pain) (Right)   8. Chronic knee pain (4th area of Pain) (Posterior) (Left)   9. Closed wedge compression fracture of T8 vertebra, sequela   10. DDD (degenerative disc disease), thoracic   11. Displacement of thoracic intervertebral disc (C6-T3, T4-5, T6-7, T8-9)   12. Thoracic facet syndrome (Bilateral)   13. 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 DDD (degenerative disc disease), cervical   15. Chronic upper extremity pain (Right)      Updated Problems: Problem  Cervicalgia  Chronic upper extremity pain (Right)  Cervical spondylosis with radiculopathy (Right)  Cervical radiculopathy at C7 (Right)    Plan of Care  Problem-specific:  No problem-specific Assessment & Plan notes found for this encounter.  Ms. Dudley Cooley has a current medication list which includes the following long-term medication(s): atorvastatin, fluticasone, levothyroxine, lisinopril, metoprolol succinate, and zolpidem.  Pharmacotherapy (Medications Ordered): No orders of  the defined types were placed in this encounter.  Orders:  Orders Placed This Encounter  Procedures  . Cervical Epidural Injection    Level(s): C7-T1 Laterality: Right-sided Purpose: Diagnostic/Therapeutic Indication(s): Radiculitis and cervicalgia associater with cervical degenerative disc disease.    Standing Status:   Future    Standing Expiration Date:   10/14/2020     Scheduling Instructions:     Procedure: Cervical Epidural Steroid Injection/Block     Sedation: No Sedation.     Timeframe: As soon as schedule allows    Order Specific Question:   Where will this procedure be performed?    Answer:   [facility]    Comments:   by Dr. Dossie Arbour   Follow-up plan:   Return for Procedure (no sedation): (R) CESI #1.      Interventional treatment options: Planned, scheduled, and/or pending:   Diagnostic right T9-10 thoracic ESI #1    Under consideration:   Diagnostic right T9-10 thoracic ESI #1  Diagnostic bilateral T9, T10, T11, and T12 MMB thoracic facet RFA (DIFICULT CANDIDATE DUE TO HARDWARE)    Therapeutic/palliative (PRN):   Diagnostic bilateral T9, T10, T11, and T12 MMB thoracic facet block #3     Recent Visits Date Type Provider Dept  06/22/20 Telemedicine Milinda Pointer, MD [facility]  Showing recent visits within past 90 days and meeting all other requirements Today's Visits Date Type Provider Dept  09/13/20 Office Visit Milinda Pointer, MD [facility]  Showing today's visits and meeting all other requirements Future Appointments Date Type Provider Dept  09/14/20 Appointment Milinda Pointer, MD [facility]  Showing future appointments within next 90 days and meeting all other requirements  I discussed the assessment and treatment plan with the patient. The patient was provided an opportunity to ask questions and all were answered. The patient agreed with the plan and demonstrated an understanding of the instructions.  Patient advised to call back or seek an in-person evaluation if the symptoms or condition worsens.  Duration of encounter: 30 minutes.  Note by: Gaspar Cola, MD Date: 09/13/2020; Time: 11:22 AM

## 2020-09-13 ENCOUNTER — Other Ambulatory Visit: Payer: Self-pay

## 2020-09-13 ENCOUNTER — Other Ambulatory Visit: Payer: Self-pay | Admitting: Student

## 2020-09-13 ENCOUNTER — Encounter: Payer: Self-pay | Admitting: Pain Medicine

## 2020-09-13 ENCOUNTER — Ambulatory Visit: Payer: Medicare Other | Attending: Pain Medicine | Admitting: Pain Medicine

## 2020-09-13 VITALS — BP 159/70 | HR 91 | Temp 96.9°F | Resp 16 | Ht 59.0 in | Wt 126.0 lb

## 2020-09-13 DIAGNOSIS — M5124 Other intervertebral disc displacement, thoracic region: Secondary | ICD-10-CM | POA: Diagnosis not present

## 2020-09-13 DIAGNOSIS — M25551 Pain in right hip: Secondary | ICD-10-CM

## 2020-09-13 DIAGNOSIS — M25562 Pain in left knee: Secondary | ICD-10-CM

## 2020-09-13 DIAGNOSIS — M4722 Other spondylosis with radiculopathy, cervical region: Secondary | ICD-10-CM | POA: Insufficient documentation

## 2020-09-13 DIAGNOSIS — G8929 Other chronic pain: Secondary | ICD-10-CM | POA: Diagnosis not present

## 2020-09-13 DIAGNOSIS — M4325 Fusion of spine, thoracolumbar region: Secondary | ICD-10-CM | POA: Insufficient documentation

## 2020-09-13 DIAGNOSIS — M25552 Pain in left hip: Secondary | ICD-10-CM | POA: Diagnosis not present

## 2020-09-13 DIAGNOSIS — M7061 Trochanteric bursitis, right hip: Secondary | ICD-10-CM

## 2020-09-13 DIAGNOSIS — S22060S Wedge compression fracture of T7-T8 vertebra, sequela: Secondary | ICD-10-CM | POA: Insufficient documentation

## 2020-09-13 DIAGNOSIS — M5134 Other intervertebral disc degeneration, thoracic region: Secondary | ICD-10-CM | POA: Diagnosis not present

## 2020-09-13 DIAGNOSIS — M5412 Radiculopathy, cervical region: Secondary | ICD-10-CM | POA: Diagnosis not present

## 2020-09-13 DIAGNOSIS — M546 Pain in thoracic spine: Secondary | ICD-10-CM | POA: Insufficient documentation

## 2020-09-13 DIAGNOSIS — M79604 Pain in right leg: Secondary | ICD-10-CM | POA: Diagnosis not present

## 2020-09-13 DIAGNOSIS — M5030 Other cervical disc degeneration, unspecified cervical region: Secondary | ICD-10-CM | POA: Insufficient documentation

## 2020-09-13 DIAGNOSIS — M542 Cervicalgia: Secondary | ICD-10-CM | POA: Diagnosis not present

## 2020-09-13 DIAGNOSIS — M79601 Pain in right arm: Secondary | ICD-10-CM | POA: Diagnosis not present

## 2020-09-13 DIAGNOSIS — S76011A Strain of muscle, fascia and tendon of right hip, initial encounter: Secondary | ICD-10-CM

## 2020-09-13 DIAGNOSIS — M47894 Other spondylosis, thoracic region: Secondary | ICD-10-CM | POA: Diagnosis not present

## 2020-09-13 DIAGNOSIS — G894 Chronic pain syndrome: Secondary | ICD-10-CM | POA: Insufficient documentation

## 2020-09-13 NOTE — Progress Notes (Signed)
Safety precautions to be maintained throughout the outpatient stay will include: orient to surroundings, keep bed in low position, maintain call bell within reach at all times, provide assistance with transfer out of bed and ambulation.  

## 2020-09-13 NOTE — Patient Instructions (Addendum)
____________________________________________________________________________________________  Preparing for your procedure (without sedation)  Procedure appointments are limited to planned procedures: . No Prescription Refills. . No disability issues will be discussed. . No medication changes will be discussed.  Instructions: . Oral Intake: Do not eat or drink anything for at least 6 hours prior to your procedure. (Exception: Blood Pressure Medication. See below.) . Transportation: Unless otherwise stated by your physician, you may drive yourself after the procedure. . Blood Pressure Medicine: Do not forget to take your blood pressure medicine with a sip of water the morning of the procedure. If your Diastolic (lower reading)is above 100 mmHg, elective cases will be cancelled/rescheduled. . Blood thinners: These will need to be stopped for procedures. Notify our staff if you are taking any blood thinners. Depending on which one you take, there will be specific instructions on how and when to stop it. . Diabetics on insulin: Notify the staff so that you can be scheduled 1st case in the morning. If your diabetes requires high dose insulin, take only  of your normal insulin dose the morning of the procedure and notify the staff that you have done so. . Preventing infections: Shower with an antibacterial soap the morning of your procedure.  . Build-up your immune system: Take 1000 mg of Vitamin C with every meal (3 times a day) the day prior to your procedure. . Antibiotics: Inform the staff if you have a condition or reason that requires you to take antibiotics before dental procedures. . Pregnancy: If you are pregnant, call and cancel the procedure. . Sickness: If you have a cold, fever, or any active infections, call and cancel the procedure. . Arrival: You must be in the facility at least 30 minutes prior to your scheduled procedure. . Children: Do not bring any children with you. . Dress  appropriately: Bring dark clothing that you would not mind if they get stained. . Valuables: Do not bring any jewelry or valuables.  Reasons to call and reschedule or cancel your procedure: (Following these recommendations will minimize the risk of a serious complication.) . Surgeries: Avoid having procedures within 2 weeks of any surgery. (Avoid for 2 weeks before or after any surgery). . Flu Shots: Avoid having procedures within 2 weeks of a flu shots or . (Avoid for 2 weeks before or after immunizations). . Barium: Avoid having a procedure within 7-10 days after having had a radiological study involving the use of radiological contrast. (Myelograms, Barium swallow or enema study). . Heart attacks: Avoid any elective procedures or surgeries for the initial 6 months after a "Myocardial Infarction" (Heart Attack). . Blood thinners: It is imperative that you stop these medications before procedures. Let us know if you if you take any blood thinner.  . Infection: Avoid procedures during or within two weeks of an infection (including chest colds or gastrointestinal problems). Symptoms associated with infections include: Localized redness, fever, chills, night sweats or profuse sweating, burning sensation when voiding, cough, congestion, stuffiness, runny nose, sore throat, diarrhea, nausea, vomiting, cold or Flu symptoms, recent or current infections. It is specially important if the infection is over the area that we intend to treat. . Heart and lung problems: Symptoms that may suggest an active cardiopulmonary problem include: cough, chest pain, breathing difficulties or shortness of breath, dizziness, ankle swelling, uncontrolled high or unusually low blood pressure, and/or palpitations. If you are experiencing any of these symptoms, cancel your procedure and contact your primary care physician for an evaluation.  Remember:  Regular   Business hours are:  Monday to Thursday 8:00 AM to 4:00  PM  Provider's Schedule: Milinda Pointer, MD:  Procedure days: Tuesday and Thursday 7:30 AM to 4:00 PM  Gillis Santa, MD:  Procedure days: Monday and Wednesday 7:30 AM to 4:00 PM ____________________________________________________________________________________________    Epidural Steroid Injection Patient Information  Description: The epidural space surrounds the nerves as they exit the spinal cord.  In some patients, the nerves can be compressed and inflamed by a bulging disc or a tight spinal canal (spinal stenosis).  By injecting steroids into the epidural space, we can bring irritated nerves into direct contact with a potentially helpful medication.  These steroids act directly on the irritated nerves and can reduce swelling and inflammation which often leads to decreased pain.  Epidural steroids may be injected anywhere along the spine and from the neck to the low back depending upon the location of your pain.   After numbing the skin with local anesthetic (like Novocaine), a small needle is passed into the epidural space slowly.  You may experience a sensation of pressure while this is being done.  The entire block usually last less than 10 minutes.  Conditions which may be treated by epidural steroids:   Low back and leg pain  Neck and arm pain  Spinal stenosis  Post-laminectomy syndrome  Herpes zoster (shingles) pain  Pain from compression fractures  Preparation for the injection:  1. Do not eat any solid food or dairy products within 8 hours of your appointment.  2. You may drink clear liquids up to 3 hours before appointment.  Clear liquids include water, black coffee, juice or soda.  No milk or cream please. 3. You may take your regular medication, including pain medications, with a sip of water before your appointment  Diabetics should hold regular insulin (if taken separately) and take 1/2 normal NPH dos the morning of the procedure.  Carry some sugar containing  items with you to your appointment. 4. A driver must accompany you and be prepared to drive you home after your procedure.  5. Bring all your current medications with your. 6. An IV may be inserted and sedation may be given at the discretion of the physician.   7. A blood pressure cuff, EKG and other monitors will often be applied during the procedure.  Some patients may need to have extra oxygen administered for a short period. 8. You will be asked to provide medical information, including your allergies, prior to the procedure.  We must know immediately if you are taking blood thinners (like Coumadin/Warfarin)  Or if you are allergic to IV iodine contrast (dye). We must know if you could possible be pregnant.  Possible side-effects:  Bleeding from needle site  Infection (rare, may require surgery)  Nerve injury (rare)  Numbness & tingling (temporary)  Difficulty urinating (rare, temporary)  Spinal headache ( a headache worse with upright posture)  Light -headedness (temporary)  Pain at injection site (several days)  Decreased blood pressure (temporary)  Weakness in arm/leg (temporary)  Pressure sensation in back/neck (temporary)  Call if you experience:  Fever/chills associated with headache or increased back/neck pain.  Headache worsened by an upright position.  New onset weakness or numbness of an extremity below the injection site  Hives or difficulty breathing (go to the emergency room)  Inflammation or drainage at the infection site  Severe back/neck pain  Any new symptoms which are concerning to you  Please note:  Although the local anesthetic  injected can often make your back or neck feel good for several hours after the injection, the pain will likely return.  It takes 3-7 days for steroids to work in the epidural space.  You may not notice any pain relief for at least that one week.  If effective, we will often do a series of three injections spaced 3-6  weeks apart to maximally decrease your pain.  After the initial series, we generally will wait several months before considering a repeat injection of the same type.  If you have any questions, please call [phone] [facility]

## 2020-09-14 ENCOUNTER — Other Ambulatory Visit: Payer: Self-pay

## 2020-09-14 ENCOUNTER — Ambulatory Visit
Admission: RE | Admit: 2020-09-14 | Discharge: 2020-09-14 | Disposition: A | Discharge status: Home / Self Care | Payer: Medicare Other | Source: Ambulatory Visit | Attending: Pain Medicine | Admitting: Pain Medicine

## 2020-09-14 ENCOUNTER — Encounter: Payer: Self-pay | Admitting: Pain Medicine

## 2020-09-14 ENCOUNTER — Ambulatory Visit (HOSPITAL_BASED_OUTPATIENT_CLINIC_OR_DEPARTMENT_OTHER): Payer: Medicare Other | Admitting: Pain Medicine

## 2020-09-14 VITALS — BP 129/73 | HR 80 | Temp 97.0°F | Resp 16 | Ht 59.0 in | Wt 126.0 lb

## 2020-09-14 DIAGNOSIS — M4722 Other spondylosis with radiculopathy, cervical region: Secondary | ICD-10-CM | POA: Insufficient documentation

## 2020-09-14 DIAGNOSIS — M5030 Other cervical disc degeneration, unspecified cervical region: Secondary | ICD-10-CM | POA: Insufficient documentation

## 2020-09-14 DIAGNOSIS — M5412 Radiculopathy, cervical region: Secondary | ICD-10-CM | POA: Diagnosis not present

## 2020-09-14 DIAGNOSIS — M542 Cervicalgia: Secondary | ICD-10-CM | POA: Diagnosis not present

## 2020-09-14 MED ORDER — LIDOCAINE HCL 2 % IJ SOLN
20.0000 mL | Freq: Once | INTRAMUSCULAR | Status: AC
  Administered 2020-09-14: 400 mg
  Filled 2020-09-14: qty 40

## 2020-09-14 MED ORDER — IOHEXOL 180 MG/ML  SOLN
10.0000 mL | Freq: Once | INTRAMUSCULAR | Status: AC
  Administered 2020-09-14: 10 mL via EPIDURAL

## 2020-09-14 MED ORDER — DEXAMETHASONE SODIUM PHOSPHATE 10 MG/ML IJ SOLN
10.0000 mg | Freq: Once | INTRAMUSCULAR | Status: AC
  Administered 2020-09-14: 10 mg
  Filled 2020-09-14: qty 1

## 2020-09-14 MED ORDER — ROPIVACAINE HCL 2 MG/ML IJ SOLN
1.0000 mL | Freq: Once | INTRAMUSCULAR | Status: AC
  Administered 2020-09-14: 1 mL via EPIDURAL
  Filled 2020-09-14: qty 10

## 2020-09-14 MED ORDER — SODIUM CHLORIDE 0.9% FLUSH
1.0000 mL | Freq: Once | INTRAVENOUS | Status: AC
  Administered 2020-09-14: 1 mL

## 2020-09-14 NOTE — Progress Notes (Signed)
Safety precautions to be maintained throughout the outpatient stay will include: orient to surroundings, keep bed in low position, maintain call bell within reach at all times, provide assistance with transfer out of bed and ambulation.  

## 2020-09-14 NOTE — Progress Notes (Signed)
PROVIDER NOTE: Information contained herein reflects review and annotations entered in association with encounter. Interpretation of such information and data should be left to medically-trained personnel. Information provided to patient can be located elsewhere in the medical record under "Patient Instructions". Document created using STT-dictation technology, any transcriptional errors that may result from process are unintentional.    Patient: Maria Spencer  Service Category: Procedure  Provider: Gaspar Cola, MD  DOB: 12-Aug-1946  DOS: 09/14/2020  Location: [facility]  MRN: [id number]  Setting: Ambulatory - outpatient  Referring Provider: McLean-Scocuzza, Olivia Mackie *  Type: Established Patient  Specialty: Interventional Pain Management  PCP: McLean-Scocuzza, Nino Glow, MD   Primary Reason for Visit: Interventional Pain Management Treatment. CC: Neck Pain (right is worse )  Procedure:          Anesthesia, Analgesia, Anxiolysis:  Type: Diagnostic, Inter-Laminar, Cervical Epidural Steroid Injection  #1  Region: Posterior Cervico-thoracic Region Level: C7-T1 Laterality: Right-Sided Paramedial  Type: Local Anesthesia Indication(s): Analgesia         Route: Infiltration ([place]/IM) IV Access: Declined Sedation: Declined  Local Anesthetic: Lidocaine 1-2%  Position: Prone with head of the table was raised to facilitate breathing.   Indications: 1. Cervical radiculopathy at C7 (Right)   2. Cervical spondylosis with radiculopathy (Right)   3. Cervicalgia   4. DDD (degenerative disc disease), cervical    Pain Score: Pre-procedure: 4 /10 Post-procedure: 0-No pain/10   Pre-op Assessment:  Ms. Asebedo is a 74 y.o. (year old), female patient, seen today for interventional treatment. She  has a past surgical history that includes Total shoulder replacement; Shoulder surgery; Tonsillectomy; Bone graft hip iliac crest; Spine surgery (10/23/2017); Ventral hernia repair (N/A,  05/29/2018); Insertion of mesh (N/A, 05/29/2018); Eye surgery; Cardiac catheterization; Breast lumpectomy (Right, 2009); Breast biopsy (Left, 2009); Breast biopsy (Right, 2009); Colonoscopy with propofol (N/A, 05/15/2020); and Spinal fusion. Ms. Beddow has a current medication list which includes the following prescription(s): acetaminophen, atorvastatin, ergocalciferol, fluticasone, forteo, gabapentin, levothyroxine, lisinopril, metoprolol succinate, multi-vitamins, nitroglycerin, tramadol, zolpidem, baclofen, gabapentin, oxycodone, and pantoprazole. Her primarily concern today is the Neck Pain (right is worse )  Initial Vital Signs:  Pulse/HCG Rate: 80ECG Heart Rate: 71 Temp: (!) 97 F (36.1 C) Resp: 16 BP: (!) 154/75 SpO2: 100 %  BMI: Estimated body mass index is 25.45 kg/m as calculated from the following:   Height as of this encounter: 4\' 11"  (1.499 m).   Weight as of this encounter: 126 lb (57.2 kg).  Risk Assessment: Allergies: Reviewed. She is allergic to sulfa antibiotics, nitrofuran derivatives, amlodipine besylate, and nitrofurantoin.  Allergy Precautions: None required Coagulopathies: Reviewed. None identified.  Blood-thinner therapy: None at this time Active Infection(s): Reviewed. None identified. Ms. Heslin is afebrile  Site Confirmation: Ms. Monda was asked to confirm the procedure and laterality before marking the site Procedure checklist: Completed Consent: Before the procedure and under the influence of no sedative(s), amnesic(s), or anxiolytics, the patient was informed of the treatment options, risks and possible complications. To fulfill our ethical and legal obligations, as recommended by the American Medical Association's Code of Ethics, I have informed the patient of my clinical impression; the nature and purpose of the treatment or procedure; the risks, benefits, and possible complications of the intervention; the alternatives, including doing nothing; the risk(s) and  benefit(s) of the alternative treatment(s) or procedure(s); and the risk(s) and benefit(s) of doing nothing. The patient was provided information about the general risks and possible complications associated with the procedure. These may  include, but are not limited to: failure to achieve desired goals, infection, bleeding, organ or nerve damage, allergic reactions, paralysis, and death. In addition, the patient was informed of those risks and complications associated to Spine-related procedures, such as failure to decrease pain; infection (i.e.: Meningitis, epidural or intraspinal abscess); bleeding (i.e.: epidural hematoma, subarachnoid hemorrhage, or any other type of intraspinal or peri-dural bleeding); organ or nerve damage (i.e.: Any type of peripheral nerve, nerve root, or spinal cord injury) with subsequent damage to sensory, motor, and/or autonomic systems, resulting in permanent pain, numbness, and/or weakness of one or several areas of the body; allergic reactions; (i.e.: anaphylactic reaction); and/or death. Furthermore, the patient was informed of those risks and complications associated with the medications. These include, but are not limited to: allergic reactions (i.e.: anaphylactic or anaphylactoid reaction(s)); adrenal axis suppression; blood sugar elevation that in diabetics may result in ketoacidosis or comma; water retention that in patients with history of congestive heart failure may result in shortness of breath, pulmonary edema, and decompensation with resultant heart failure; weight gain; swelling or edema; medication-induced neural toxicity; particulate matter embolism and blood vessel occlusion with resultant organ, and/or nervous system infarction; and/or aseptic necrosis of one or more joints. Finally, the patient was informed that Medicine is not an exact science; therefore, there is also the possibility of unforeseen or unpredictable risks and/or possible complications that may  result in a catastrophic outcome. The patient indicated having understood very clearly. We have given the patient no guarantees and we have made no promises. Enough time was given to the patient to ask questions, all of which were answered to the patient's satisfaction. Ms. Schaus has indicated that she wanted to continue with the procedure. Attestation: I, the ordering provider, attest that I have discussed with the patient the benefits, risks, side-effects, alternatives, likelihood of achieving goals, and potential problems during recovery for the procedure that I have provided informed consent. Date  Time: 09/14/2020 11:12 AM  Pre-Procedure Preparation:  Monitoring: As per clinic protocol. Respiration, ETCO2, SpO2, BP, heart rate and rhythm monitor placed and checked for adequate function Safety Precautions: Patient was assessed for positional comfort and pressure points before starting the procedure. Time-out: I initiated and conducted the "Time-out" before starting the procedure, as per protocol. The patient was asked to participate by confirming the accuracy of the "Time Out" information. Verification of the correct person, site, and procedure were performed and confirmed by me, the nursing staff, and the patient. "Time-out" conducted as per Joint Commission's Universal Protocol (UP.01.01.01). Time: 1154  Description of Procedure:          Target Area: For Epidural Steroid injections the target is the interlaminar space, initially targeting the lower border of the superior vertebral body lamina. Approach: Paramedial approach. Area Prepped: Entire PosteriorCervical Region DuraPrep (Iodine Povacrylex [0.7% available iodine] and Isopropyl Alcohol, 74% w/w) Safety Precautions: Aspiration looking for blood return was conducted prior to all injections. At no point did we inject any substances, as a needle was being advanced. No attempts were made at seeking any paresthesias. Safe injection practices  and needle disposal techniques used. Medications properly checked for expiration dates. SDV (single dose vial) medications used. Description of the Procedure: Protocol guidelines were followed. The procedure needle was introduced through the skin, ipsilateral to the reported pain, and advanced to the target area. Bone was contacted and the needle walked caudad, until the lamina was cleared. The epidural space was identified using "loss-of-resistance technique" with 2-3 ml of PF-NaCl (  0.9% NSS), in a 5cc LOR glass syringe. Vitals:   09/14/20 1111 09/14/20 1152 09/14/20 1158 09/14/20 1200  BP: (!) 154/75 128/75 131/71 129/73  Pulse: 80     Resp: 16 16 16 16   Temp: (!) 97 F (36.1 C)     TempSrc: Temporal     SpO2: 100% 100% 100% 100%  Weight: 126 lb (57.2 kg)     Height: 4\' 11"  (1.499 m)       Start Time: 1154 hrs. End Time: 1200 hrs. Materials:  Needle(s) Type: Epidural needle Gauge: 17G Length: 3.5-in Medication(s): Please see orders for medications and dosing details.  Imaging Guidance (Spinal):          Type of Imaging Technique: Fluoroscopy Guidance (Spinal) Indication(s): Assistance in needle guidance and placement for procedures requiring needle placement in or near specific anatomical locations not easily accessible without such assistance. Exposure Time: Please see nurses notes. Contrast: Before injecting any contrast, we confirmed that the patient did not have an allergy to iodine, shellfish, or radiological contrast. Once satisfactory needle placement was completed at the desired level, radiological contrast was injected. Contrast injected under live fluoroscopy. No contrast complications. See chart for type and volume of contrast used. Fluoroscopic Guidance: I was personally present during the use of fluoroscopy. "Tunnel Vision Technique" used to obtain the best possible view of the target area. Parallax error corrected before commencing the procedure. "Direction-depth-direction"  technique used to introduce the needle under continuous pulsed fluoroscopy. Once target was reached, antero-posterior, oblique, and lateral fluoroscopic projection used confirm needle placement in all planes. Images permanently stored in EMR. Interpretation: I personally interpreted the imaging intraoperatively. Adequate needle placement confirmed in multiple planes. Appropriate spread of contrast into desired area was observed. No evidence of afferent or efferent intravascular uptake. No intrathecal or subarachnoid spread observed. Permanent images saved into the patient's record.  Antibiotic Prophylaxis:   Anti-infectives (From admission, onward)   None     Indication(s): None identified  Post-operative Assessment:  Post-procedure Vital Signs:  Pulse/HCG Rate: 8076 Temp: (!) 97 F (36.1 C) Resp: 16 BP: 129/73 SpO2: 100 %  EBL: None  Complications: No immediate post-treatment complications observed by team, or reported by patient.  Note: The patient tolerated the entire procedure well. A repeat set of vitals were taken after the procedure and the patient was kept under observation following institutional policy, for this type of procedure. Post-procedural neurological assessment was performed, showing return to baseline, prior to discharge. The patient was provided with post-procedure discharge instructions, including a section on how to identify potential problems. Should any problems arise concerning this procedure, the patient was given instructions to immediately contact us, at any time, without hesitation. In any case, we plan to contact the patient by telephone for a follow-up status report regarding this interventional procedure.  Comments:  No additional relevant information.  Plan of Care  Orders:  Orders Placed This Encounter  Procedures  . Cervical Epidural Injection    Procedure: Cervical Epidural Steroid Injection/Block Purpose: Diagnostic Indication(s): Radiculitis  and cervicalgia associater with cervical degenerative disc disease.    Scheduling Instructions:     Level(s): C7-T1     Laterality: Right-sided     Sedation: Patient's choice.     Timeframe: Today    Order Specific Question:   Where will this procedure be performed?    Answer:   [facility]    Comments:   by Dr. Dossie Arbour  . [place] 1-60 MIN NO  REPORT    Intraoperative interpretation by procedural physician at [place].    Standing Status:   Standing    Number of Occurrences:   1    Order Specific Question:   Reason for exam:    Answer:   Assistance in needle guidance and placement for procedures requiring needle placement in or near specific anatomical locations not easily accessible without such assistance.  . Informed Consent Details: Physician/Practitioner Attestation; Transcribe to consent form and obtain patient signature    Nursing Order: Transcribe to consent form and obtain patient signature. Note: Always confirm laterality of pain with Ms. Strickler, before procedure.    Order Specific Question:   Physician/Practitioner attestation of informed consent for procedure/surgical case    Answer:   I, the physician/practitioner, attest that I have discussed with the patient the benefits, risks, side effects, alternatives, likelihood of achieving goals and potential problems during recovery for the procedure that I have provided informed consent.    Order Specific Question:   Procedure    Answer:   Cervical Epidural Steroid Injection (CESI) under fluoroscopic guidance    Order Specific Question:   Physician/Practitioner performing the procedure    Answer:   Randall Colden A. Dossie Arbour MD    Order Specific Question:   Indication/Reason    Answer:   Cervicalgia (Neck Pain) with or without Cervical Radiculopathy/Radiculitis (Arm/Shoulder Pain, Numbness, and/or weakness), secondary to Cervical and/or Cervicothoracic Degenerative Disc Disease (DDD), with or without  Intervertebral Disc Displacement (IVD  . Provide equipment / supplies at bedside    "Epidural Tray" (Disposable  single use) Catheter: NOT required    Standing Status:   Standing    Number of Occurrences:   1    Order Specific Question:   Specify    Answer:   Epidural Tray   Chronic Opioid Analgesic:  None Highest recorded MME/day: 75 mg/day MME/day: 0 mg/day   Medications ordered for procedure: Meds ordered this encounter  Medications  . iohexol (OMNIPAQUE) 180 MG/ML injection 10 mL    Must be Myelogram-compatible. If not available, you may substitute with a water-soluble, non-ionic, hypoallergenic, myelogram-compatible radiological contrast medium.  Marland Kitchen lidocaine (XYLOCAINE) 2 % (with pres) injection 400 mg  . sodium chloride flush (NS) 0.9 % injection 1 mL  . ropivacaine (PF) 2 mg/mL (0.2%) (NAROPIN) injection 1 mL  . dexamethasone (DECADRON) injection 10 mg   Medications administered: We administered iohexol, lidocaine, sodium chloride flush, ropivacaine (PF) 2 mg/mL (0.2%), and dexamethasone.  See the medical record for exact dosing, route, and time of administration.  Follow-up plan:   Return for (F2F), (PP) Follow-up.       Interventional treatment options: Planned, scheduled, and/or pending:   Diagnostic right T9-10 thoracic ESI #1    Under consideration:   Diagnostic right T9-10 thoracic ESI #1  Diagnostic bilateral T9, T10, T11, and T12 MMB thoracic facet RFA (DIFICULT CANDIDATE DUE TO HARDWARE)    Therapeutic/palliative (PRN):   Diagnostic bilateral T9, T10, T11, and T12 MMB thoracic facet block #3      Recent Visits Date Type Provider Dept  09/13/20 Office Visit Milinda Pointer, [facility]  06/22/20 Telemedicine Milinda Pointer, MD [facility]  Showing recent visits within past 90 days and meeting all other requirements Today's Visits Date Type Provider Dept  09/14/20 Procedure visit Milinda Pointer, MD [facility]   Showing today's visits and meeting all other requirements Future Appointments Date Type Provider Dept  10/02/20 Appointment Milinda Pointer, MD [facility]  Showing future appointments within next 90 days and meeting all other requirements  Disposition: Discharge home  Discharge (Date  Time): 09/14/2020; 1210 hrs.   Primary Care Physician: McLean-Scocuzza, Nino Glow, MD Location: [facility] Note by: Gaspar Cola, MD Date: 09/14/2020; Time: 12:11 PM  Disclaimer:  Medicine is not an Chief Strategy Officer. The only guarantee in medicine is that nothing is guaranteed. It is important to note that the decision to proceed with this intervention was based on the information collected from the patient. The Data and conclusions were drawn from the patient's questionnaire, the interview, and the physical examination. Because the information was provided in large part by the patient, it cannot be guaranteed that it has not been purposely or unconsciously manipulated. Every effort has been made to obtain as much relevant data as possible for this evaluation. It is important to note that the conclusions that lead to this procedure are derived in large part from the available data. Always take into account that the treatment will also be dependent on availability of resources and existing treatment guidelines, considered by other Pain Management Practitioners as being common knowledge and practice, at the time of the intervention. For Medico-Legal purposes, it is also important to point out that variation in procedural techniques and pharmacological choices are the acceptable norm. The indications, contraindications, technique, and results of the above procedure should only be interpreted and judged by a Board-Certified Interventional Pain Specialist with extensive familiarity and expertise in the same exact procedure and technique.

## 2020-09-14 NOTE — Patient Instructions (Addendum)
____________________________________________________________________________________________  Post-Procedure Discharge Instructions  Instructions:  Apply ice:   Purpose: This will minimize any swelling and discomfort after procedure.   When: Day of procedure, as soon as you get home.  How: Fill a plastic sandwich bag with crushed ice. Cover it with a small towel and apply to injection site.  How long: (15 min on, 15 min off) Apply for 15 minutes then remove x 15 minutes.  Repeat sequence on day of procedure, until you go to bed.  Apply heat:   Purpose: To treat any soreness and discomfort from the procedure.  When: Starting the next day after the procedure.  How: Apply heat to procedure site starting the day following the procedure.  How long: May continue to repeat daily, until discomfort goes away.  Food intake: Start with clear liquids (like water) and advance to regular food, as tolerated.   Physical activities: Keep activities to a minimum for the first 8 hours after the procedure. After that, then as tolerated.  Driving: If you have received any sedation, be responsible and do not drive. You are not allowed to drive for 24 hours after having sedation.  Blood thinner: (Applies only to those taking blood thinners) You may restart your blood thinner 6 hours after your procedure.  Insulin: (Applies only to Diabetic patients taking insulin) As soon as you can eat, you may resume your normal dosing schedule.  Infection prevention: Keep procedure site clean and dry. Shower daily and clean area with soap and water.  Post-procedure Pain Diary: Extremely important that this be done correctly and accurately. Recorded information will be used to determine the next step in treatment. For the purpose of accuracy, follow these rules:  Evaluate only the area treated. Do not report or include pain from an untreated area. For the purpose of this evaluation, ignore all other areas of pain,  except for the treated area.  After your procedure, avoid taking a long nap and attempting to complete the pain diary after you wake up. Instead, set your alarm clock to go off every hour, on the hour, for the initial 8 hours after the procedure. Document the duration of the numbing medicine, and the relief you are getting from it.  Do not go to sleep and attempt to complete it later. It will not be accurate. If you received sedation, it is likely that you were given a medication that may cause amnesia. Because of this, completing the diary at a later time may cause the information to be inaccurate. This information is needed to plan your care.  Follow-up appointment: Keep your post-procedure follow-up evaluation appointment after the procedure (usually 2 weeks for most procedures, 6 weeks for radiofrequencies). DO NOT FORGET to bring you pain diary with you.   Expect: (What should I expect to see with my procedure?)  From numbing medicine (AKA: Local Anesthetics): Numbness or decrease in pain. You may also experience some weakness, which if present, could last for the duration of the local anesthetic.  Onset: Full effect within 15 minutes of injected.  Duration: It will depend on the type of local anesthetic used. On the average, 1 to 8 hours.   From steroids (Applies only if steroids were used): Decrease in swelling or inflammation. Once inflammation is improved, relief of the pain will follow.  Onset of benefits: Depends on the amount of swelling present. The more swelling, the longer it will take for the benefits to be seen. In some cases, up to 10 days.    Duration: Steroids will stay in the system x 2 weeks. Duration of benefits will depend on multiple posibilities including persistent irritating factors.  Side-effects: If present, they may typically last 2 weeks (the duration of the steroids).  Frequent: Cramps (if they occur, drink Gatorade and take over-the-counter Magnesium 450-500 mg  once to twice a day); water retention with temporary weight gain; increases in blood sugar; decreased immune system response; increased appetite.  Occasional: Facial flushing (red, warm cheeks); mood swings; menstrual changes.  Uncommon: Long-term decrease or suppression of natural hormones; bone thinning. (These are more common with higher doses or more frequent use. This is why we prefer that our patients avoid having any injection therapies in other practices.)   Very Rare: Severe mood changes; psychosis; aseptic necrosis.  From procedure: Some discomfort is to be expected once the numbing medicine wears off. This should be minimal if ice and heat are applied as instructed.  Call if: (When should I call?)  You experience numbness and weakness that gets worse with time, as opposed to wearing off.  New onset bowel or bladder incontinence. (Applies only to procedures done in the spine)  Emergency Numbers:  Durning business hours (Monday - Thursday, 8:00 AM - 4:00 PM) (Friday, 9:00 AM - 12:00 Noon): [phone]  After hours: [phone]  NOTE: If you are having a problem and are unable connect with, or to talk to a provider, then go to your nearest urgent care or emergency department. If the problem is serious and urgent, please call 911. ____________________________________________________________________________________________   Pain Management Discharge Instructions  General Discharge Instructions :  If you need to reach your doctor call: Monday-Friday 8:00 am - 4:00 pm at [phone] or toll free [phone].  After clinic hours [phone] to have operator reach doctor.  Bring all of your medication bottles to all your appointments in the pain clinic.  To cancel or reschedule your appointment with Pain Management please remember to call 24 hours in advance to avoid a fee.  Refer to the educational materials which you have been given on: General Risks, I had my  Procedure. Discharge Instructions, Post Sedation.  Post Procedure Instructions:  The drugs you were given will stay in your system until tomorrow, so for the next 24 hours you should not drive, make any legal decisions or drink any alcoholic beverages.  You may eat anything you prefer, but it is better to start with liquids then soups and crackers, and gradually work up to solid foods.  Please notify your doctor immediately if you have any unusual bleeding, trouble breathing or pain that is not related to your normal pain.  Depending on the type of procedure that was done, some parts of your body may feel week and/or numb.  This usually clears up by tonight or the next day.  Walk with the use of an assistive device or accompanied by an adult for the 24 hours.  You may use ice on the affected area for the first 24 hours.  Put ice in a Ziploc bag and cover with a towel and place against area 15 minutes on 15 minutes off.  You may switch to heat after 24 hours.Epidural Steroid Injection Patient Information  Description: The epidural space surrounds the nerves as they exit the spinal cord.  In some patients, the nerves can be compressed and inflamed by a bulging disc or a tight spinal canal (spinal stenosis).  By injecting steroids into the epidural space, we can bring irritated nerves   into direct contact with a potentially helpful medication.  These steroids act directly on the irritated nerves and can reduce swelling and inflammation which often leads to decreased pain.  Epidural steroids may be injected anywhere along the spine and from the neck to the low back depending upon the location of your pain.   After numbing the skin with local anesthetic (like Novocaine), a small needle is passed into the epidural space slowly.  You may experience a sensation of pressure while this is being done.  The entire block usually last less than 10 minutes.  Conditions which may be treated by epidural  steroids:   Low back and leg pain  Neck and arm pain  Spinal stenosis  Post-laminectomy syndrome  Herpes zoster (shingles) pain  Pain from compression fractures  Preparation for the injection:  1. Do not eat any solid food or dairy products within 8 hours of your appointment.  2. You may drink clear liquids up to 3 hours before appointment.  Clear liquids include water, black coffee, juice or soda.  No milk or cream please. 3. You may take your regular medication, including pain medications, with a sip of water before your appointment  Diabetics should hold regular insulin (if taken separately) and take 1/2 normal NPH dos the morning of the procedure.  Carry some sugar containing items with you to your appointment. 4. A driver must accompany you and be prepared to drive you home after your procedure.  5. Bring all your current medications with your. 6. An IV may be inserted and sedation may be given at the discretion of the physician.   7. A blood pressure cuff, EKG and other monitors will often be applied during the procedure.  Some patients may need to have extra oxygen administered for a short period. 8. You will be asked to provide medical information, including your allergies, prior to the procedure.  We must know immediately if you are taking blood thinners (like Coumadin/Warfarin)  Or if you are allergic to IV iodine contrast (dye). We must know if you could possible be pregnant.  Possible side-effects:  Bleeding from needle site  Infection (rare, may require surgery)  Nerve injury (rare)  Numbness & tingling (temporary)  Difficulty urinating (rare, temporary)  Spinal headache ( a headache worse with upright posture)  Light -headedness (temporary)  Pain at injection site (several days)  Decreased blood pressure (temporary)  Weakness in arm/leg (temporary)  Pressure sensation in back/neck (temporary)  Call if you experience:  Fever/chills associated with  headache or increased back/neck pain.  Headache worsened by an upright position.  New onset weakness or numbness of an extremity below the injection site  Hives or difficulty breathing (go to the emergency room)  Inflammation or drainage at the infection site  Severe back/neck pain  Any new symptoms which are concerning to you  Please note:  Although the local anesthetic injected can often make your back or neck feel good for several hours after the injection, the pain will likely return.  It takes 3-7 days for steroids to work in the epidural space.  You may not notice any pain relief for at least that one week.  If effective, we will often do a series of three injections spaced 3-6 weeks apart to maximally decrease your pain.  After the initial series, we generally will wait several months before considering a repeat injection of the same type.  If you have any questions, please call [phone] [facility] 

## 2020-09-15 ENCOUNTER — Telehealth: Payer: Self-pay

## 2020-09-15 NOTE — Telephone Encounter (Signed)
Post procedure phone call.  Patient states she is doing well.  

## 2020-09-18 DIAGNOSIS — M2560 Stiffness of unspecified joint, not elsewhere classified: Secondary | ICD-10-CM | POA: Diagnosis not present

## 2020-09-20 DIAGNOSIS — M2560 Stiffness of unspecified joint, not elsewhere classified: Secondary | ICD-10-CM | POA: Diagnosis not present

## 2020-09-21 ENCOUNTER — Other Ambulatory Visit: Payer: Self-pay | Admitting: Neurosurgery

## 2020-09-21 DIAGNOSIS — M4324 Fusion of spine, thoracic region: Secondary | ICD-10-CM

## 2020-09-21 DIAGNOSIS — M546 Pain in thoracic spine: Secondary | ICD-10-CM

## 2020-09-25 ENCOUNTER — Ambulatory Visit: Payer: Medicare Other | Admitting: Urology

## 2020-09-26 ENCOUNTER — Ambulatory Visit
Admission: RE | Admit: 2020-09-26 | Discharge: 2020-09-26 | Disposition: A | Discharge status: Home / Self Care | Payer: Medicare Other | Source: Ambulatory Visit | Attending: Neurosurgery | Admitting: Neurosurgery

## 2020-09-26 ENCOUNTER — Other Ambulatory Visit: Payer: Self-pay

## 2020-09-26 ENCOUNTER — Ambulatory Visit
Admission: RE | Admit: 2020-09-26 | Discharge: 2020-09-26 | Disposition: A | Discharge status: Home / Self Care | Payer: Medicare Other | Source: Ambulatory Visit | Attending: Student | Admitting: Student

## 2020-09-26 DIAGNOSIS — M50222 Other cervical disc displacement at C5-C6 level: Secondary | ICD-10-CM | POA: Diagnosis not present

## 2020-09-26 DIAGNOSIS — M4802 Spinal stenosis, cervical region: Secondary | ICD-10-CM | POA: Insufficient documentation

## 2020-09-26 DIAGNOSIS — M7061 Trochanteric bursitis, right hip: Secondary | ICD-10-CM

## 2020-09-26 DIAGNOSIS — Z981 Arthrodesis status: Secondary | ICD-10-CM | POA: Insufficient documentation

## 2020-09-26 DIAGNOSIS — S76011A Strain of muscle, fascia and tendon of right hip, initial encounter: Secondary | ICD-10-CM

## 2020-09-26 DIAGNOSIS — M25551 Pain in right hip: Secondary | ICD-10-CM | POA: Diagnosis not present

## 2020-09-26 DIAGNOSIS — G8929 Other chronic pain: Secondary | ICD-10-CM

## 2020-09-26 DIAGNOSIS — S76311A Strain of muscle, fascia and tendon of the posterior muscle group at thigh level, right thigh, initial encounter: Secondary | ICD-10-CM | POA: Diagnosis not present

## 2020-09-26 DIAGNOSIS — M769 Unspecified enthesopathy, lower limb, excluding foot: Secondary | ICD-10-CM | POA: Diagnosis not present

## 2020-09-26 DIAGNOSIS — X58XXXA Exposure to other specified factors, initial encounter: Secondary | ICD-10-CM | POA: Insufficient documentation

## 2020-09-26 IMAGING — RF DG FLUORO GUIDE NDL PLC/BX
4 series · 5 of 5 positions shown · non-contrast
Comparison: none

CLINICAL DATA: 73-year-old with right hip pain and needs an
intra-articular contrast injection for hip MRI.

[Series 2: fluoro_iodine 2fps_bw · 0.19mm/px · 1 of 1 slices shown (1 of 4)]
[im 1/1]
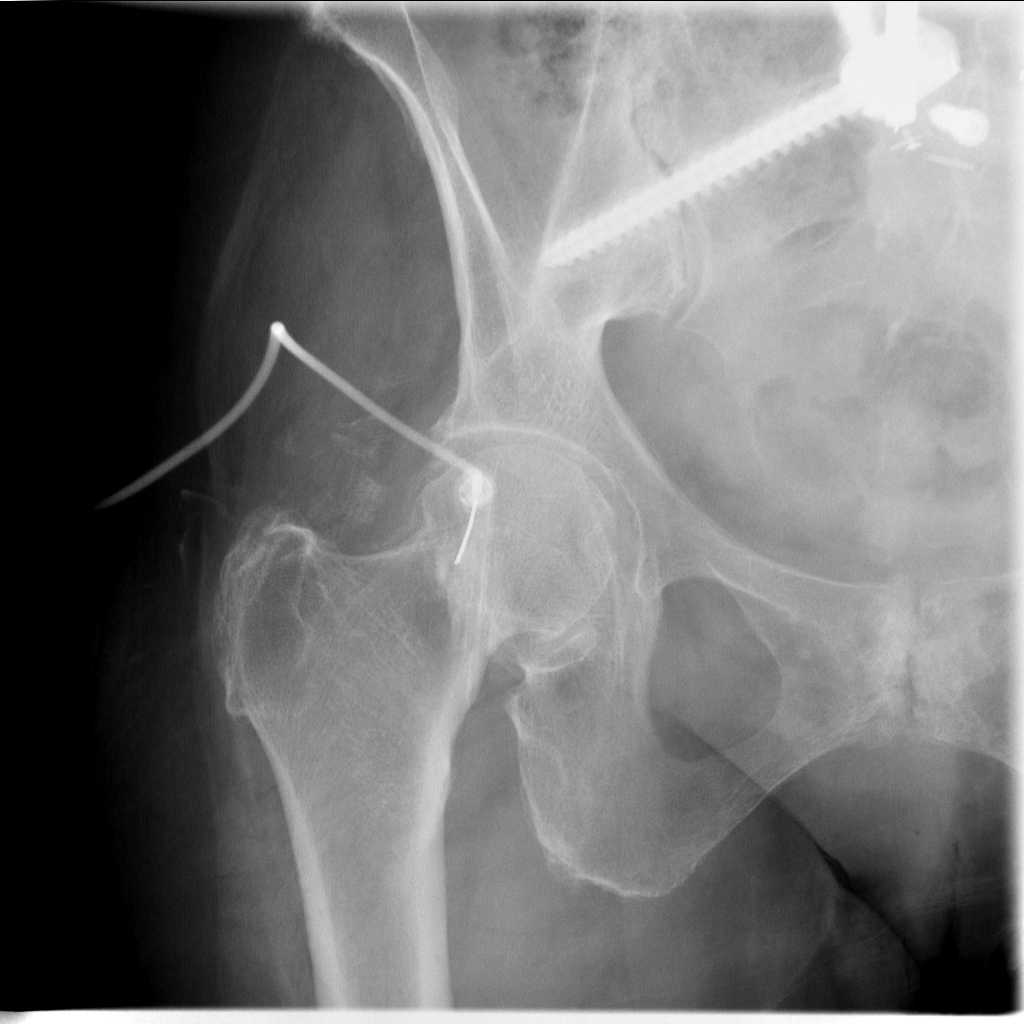

[Series 3: fluoro_iodine 2fps_bw · 0.19mm/px · 1 of 1 slices shown (2 of 4)]
[im 1/1]
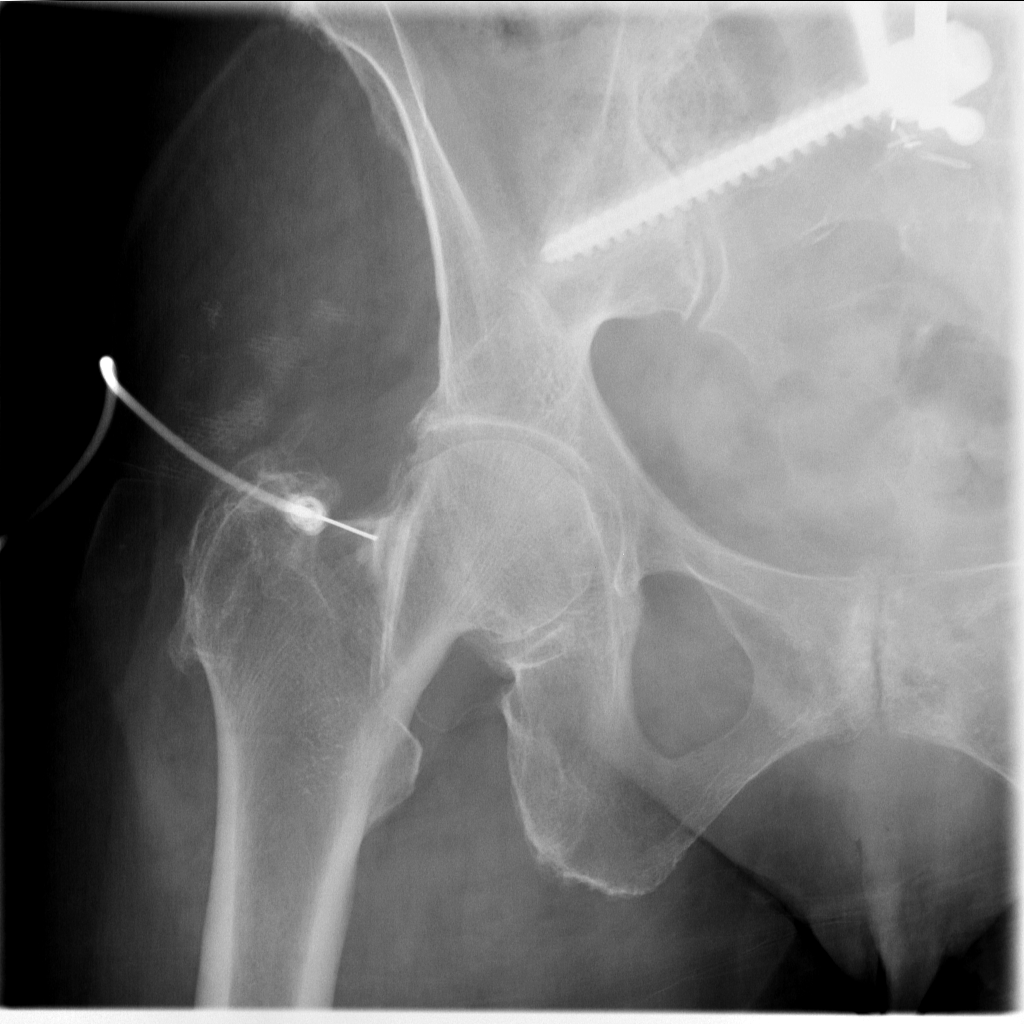

[Series 6: fluoro_iodine 2fps_bw · 0.19mm/px · 1 of 1 slices shown (3 of 4)]
[im 1/1]
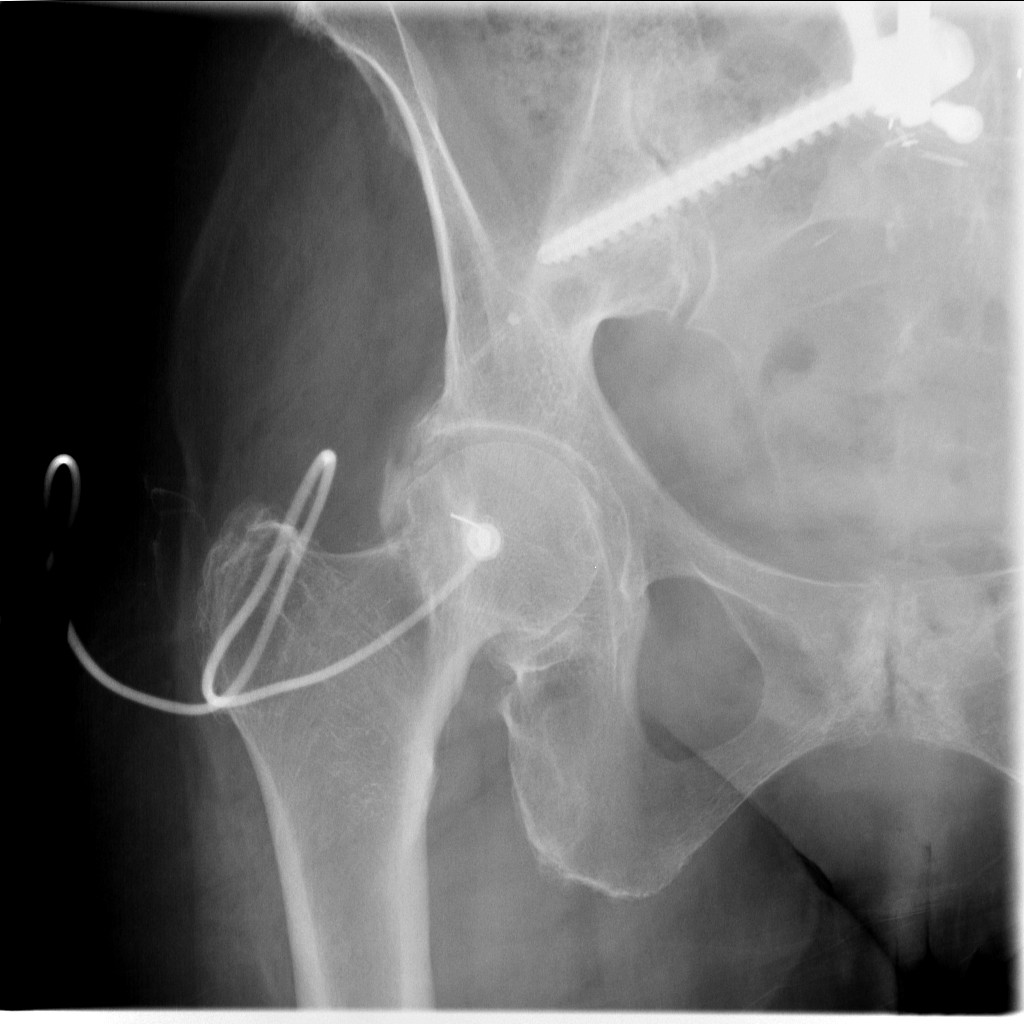

[Series 7: fluoro_iodine 2fps_bw · 0.19mm/px · 2 of 2 frames shown (4 of 4)]
[frame 1/2]
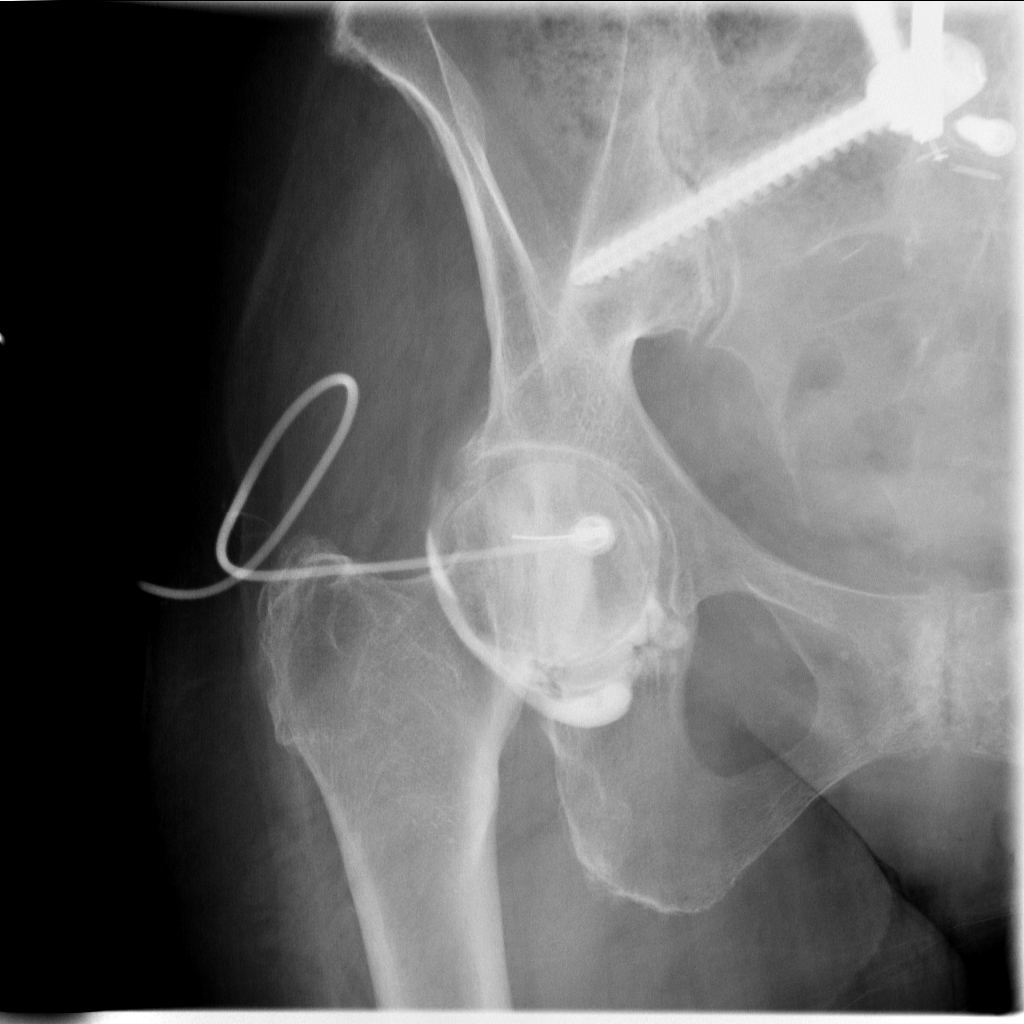
[frame 2/2]
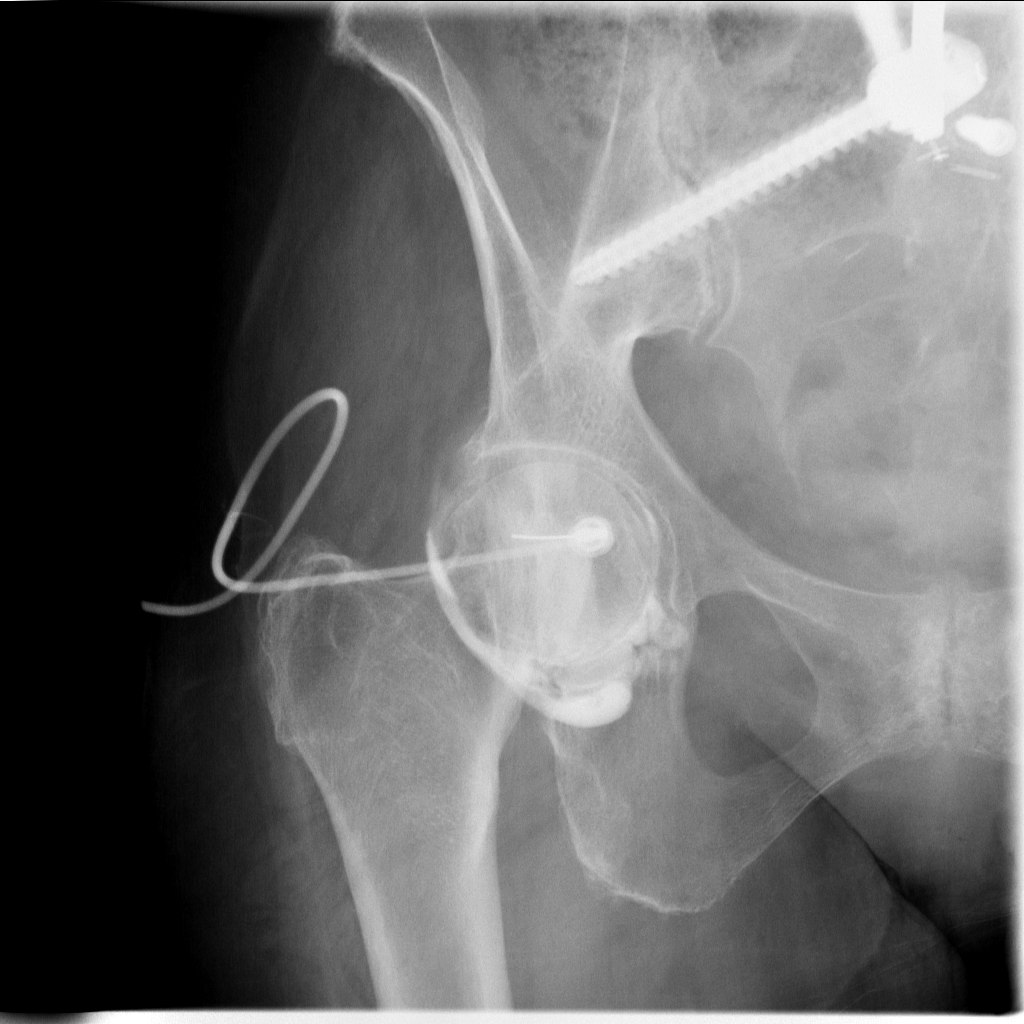

[5 of 5 positions shown; findings below may reference images not displayed]

EXAM:
RIGHT HIP INJECTION UNDER FLUOROSCOPY

FLUOROSCOPY TIME:  Fluoroscopy Time:  4 minutes and 12 seconds

Radiation Exposure Index (if provided by the fluoroscopic device):
38.5

Number of Acquired Spot Images: 4

PROCEDURE:
The procedure was explained to the patient. The risks and benefits
of the procedure were discussed and the patient's questions were
addressed. Informed consent was obtained from the patient. Right
common femoral artery was pulse was identified. The lateral right
femoral head-neck junction was identified with fluoroscopy and
marked on the skin. Overlying skin prepped with Betadine, draped in
the usual sterile fashion, and infiltrated locally with 1% buffered
Lidocaine. Curved 20 gauge spinal needle advanced to the
superolateral margin of the right femoral head-neck junction.
Contrast cocktail including 15 mL of Omnipaque 180, 5 mL of sterile
saline and 0.05 mL of Gadavist was injected into the right hip
joint. Initially, contrast was preferentially filling the anterior
muscular space. 20 gauge needle was switched for a 22 gauge needle.
Needle was directed more onto the lateral aspect of the femoral
head. Fluoroscopy confirmed filling of the right hip joint space.
Approximately 13 mL of contrast was injected. Fluoroscopic images
were obtained during injection.
IMPRESSION: Technically successful injection of contrast into the right hip
joint for an MRI study.

## 2020-09-26 IMAGING — MR MR CERVICAL SPINE W/O CM
5 series · 37 of 48 positions shown · non-contrast
Comparison: [DATE] scoliosis radiographs. [DATE] cervical
spine radiographs report.

CLINICAL DATA: Neck with bilateral shoulder/arm pain.

EXAM:
MRI CERVICAL SPINE WITHOUT CONTRAST
TECHNIQUE: Multiplanar, multisequence MR imaging of the cervical spine was
performed. No intravenous contrast was administered.

[Series 5: T2 · sagittal · 3.0mm · 0.62mm/px · 6 of 15 slices shown (1 of 2)]
[im 1/15]
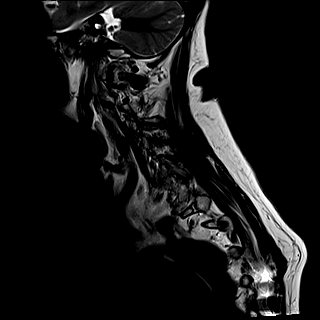
[im 3/15]
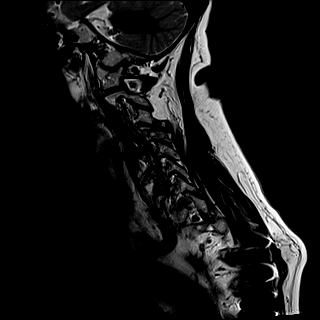
[im 6/15]
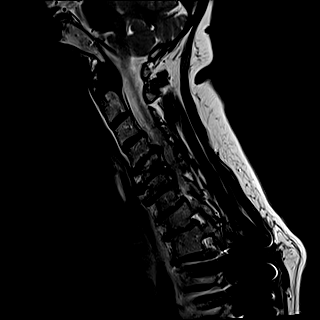
[im 9/15]
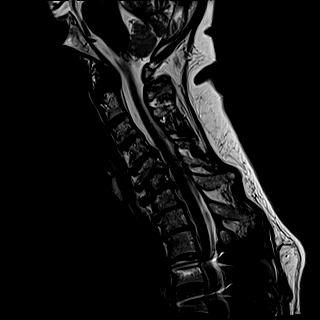
[im 12/15]
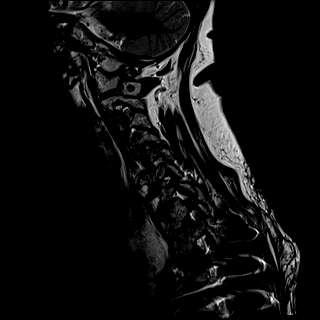
[im 15/15]
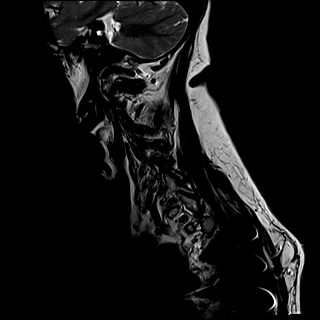

[Series 6: FLAIR · sagittal · 3.0mm · 0.78mm/px · 7 of 15 slices shown]
[im 1/15]
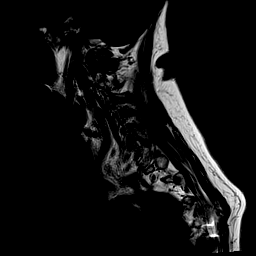
[im 3/15]
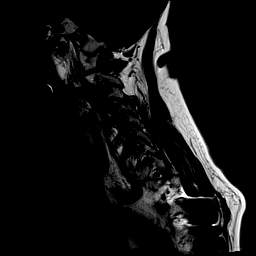
[im 5/15]
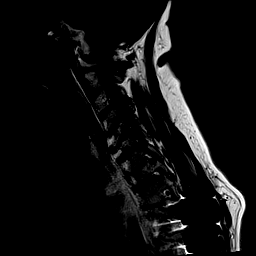
[im 8/15]
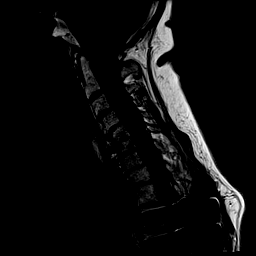
[im 10/15]
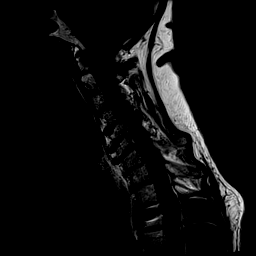
[im 12/15]
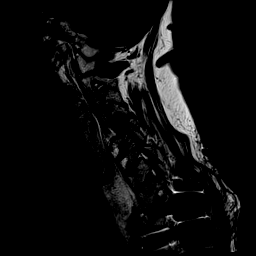
[im 15/15]
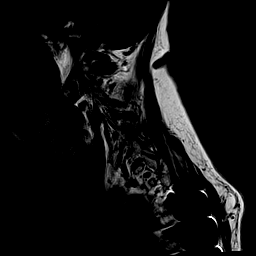

[Series 7: STIR · sagittal · 3.0mm · 0.62mm/px · 7 of 15 slices shown]
[im 1/15]
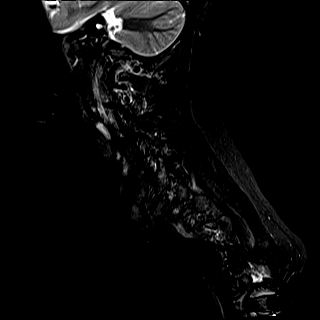
[im 3/15]
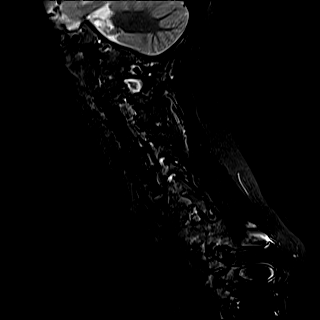
[im 5/15]
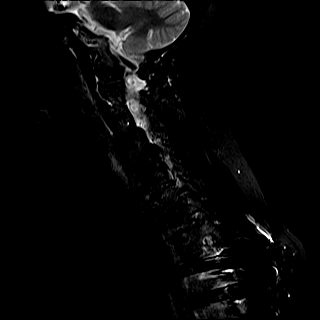
[im 8/15]
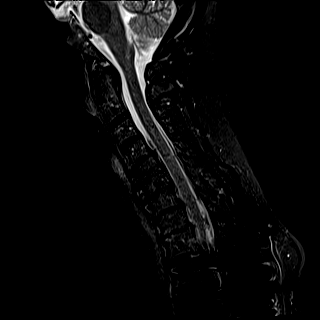
[im 10/15]
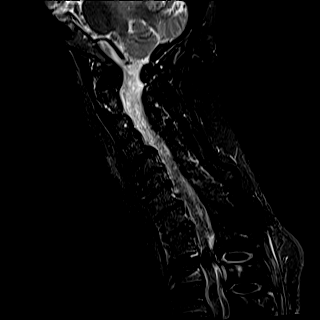
[im 12/15]
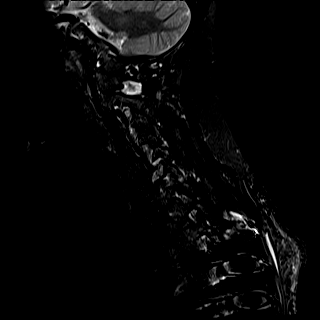
[im 15/15]
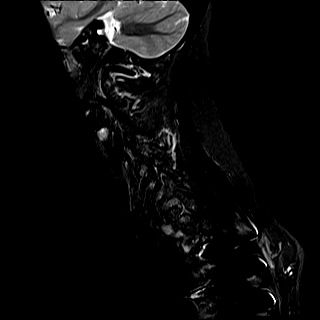

[Series 8: T2 · axial · 3.0mm · 0.70mm/px · z∈[-123,-31]mm · 9 of 29 slices shown (2 of 2)]
[im 1/29]
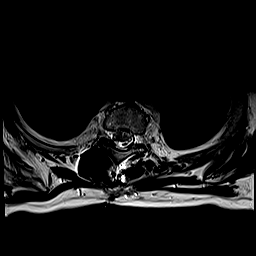
[im 3/29]
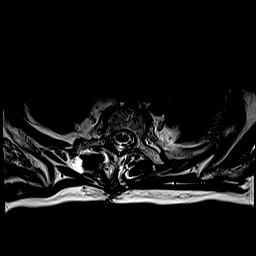
[im 5/29]
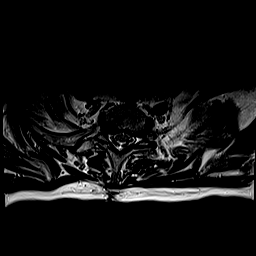
[im 9/29]
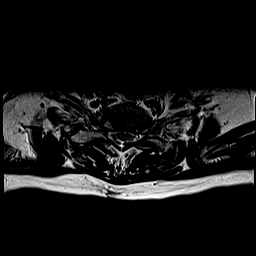
[im 13/29]
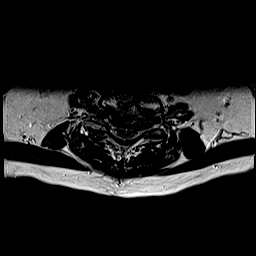
[im 16/29]
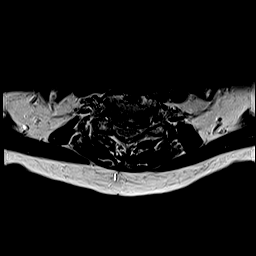
[im 20/29]
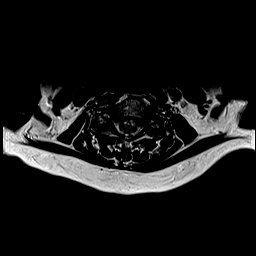
[im 24/29]
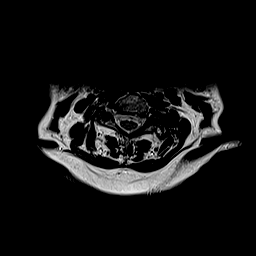
[im 29/29]
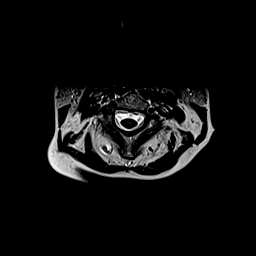

[Series 9: ax mpgr · axial · 3.0mm · 0.35mm/px · z∈[-123,-31]mm · 8 of 29 slices shown]
[im 1/29]
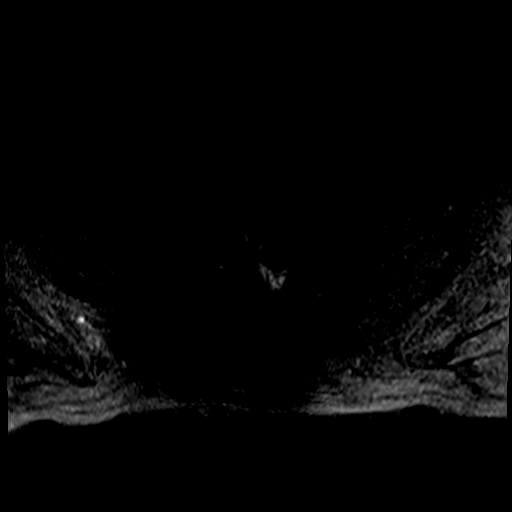
[im 5/29]
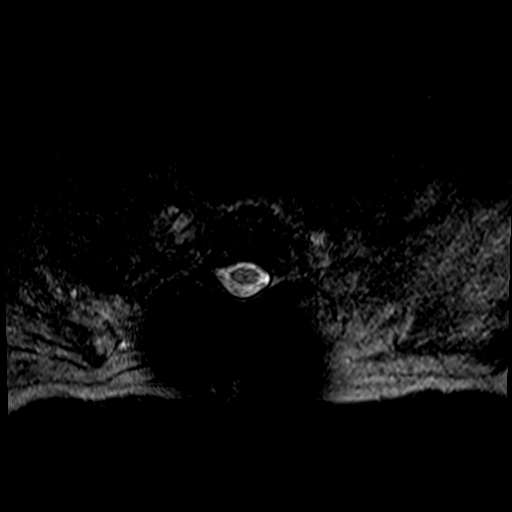
[im 9/29]
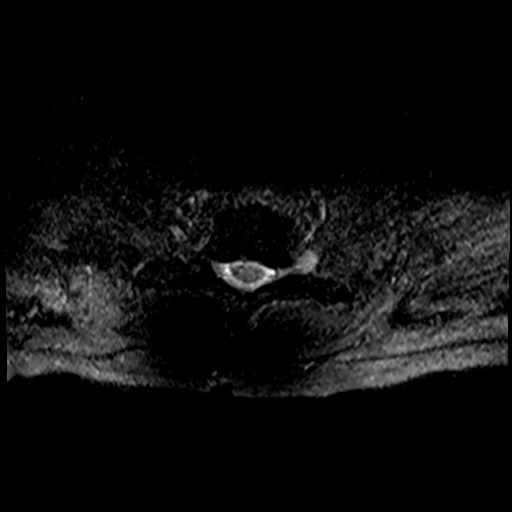
[im 13/29]
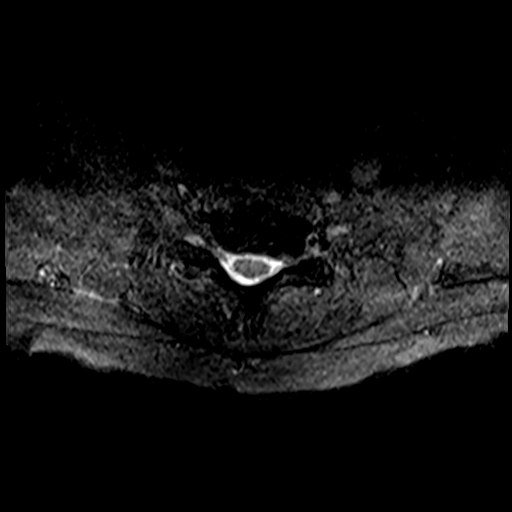
[im 16/29]
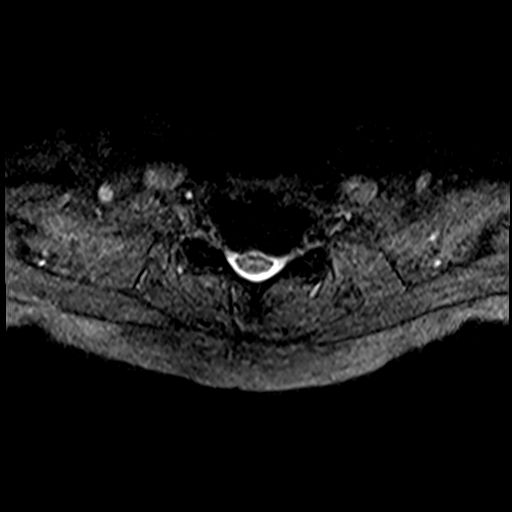
[im 20/29]
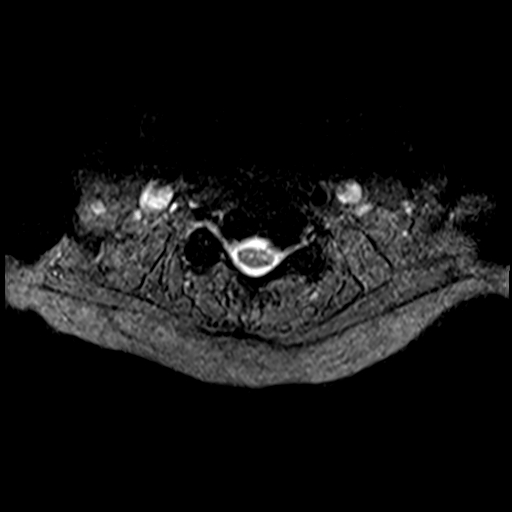
[im 24/29]
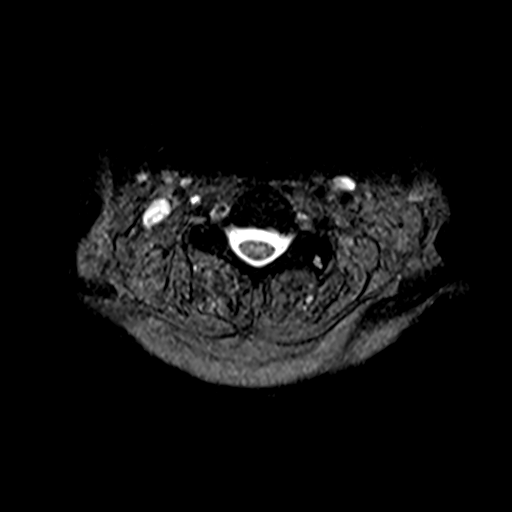
[im 29/29]
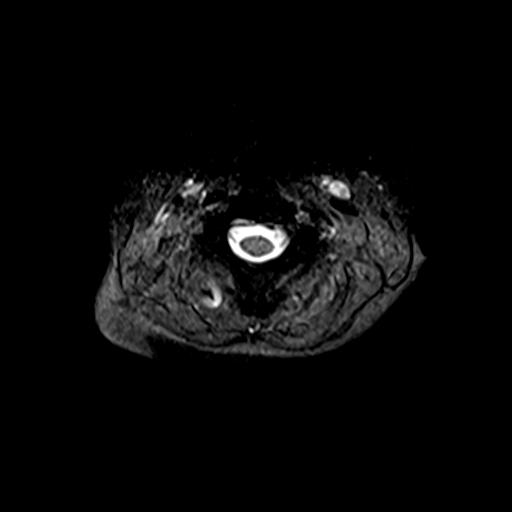

[37 of 48 positions shown; findings below may reference images not displayed]

FINDINGS: Alignment: Straightening of lordosis. Minimal grade 1 C4-5
anterolisthesis. Minimal grade 1 anterolisthesis at the C7-T1 and
T1-T2 levels.

Vertebrae: Multilevel Modic type 2 endplate degenerative changes
most prominent at the C5-7 level. Small left facet degenerative
geode at the C3 level ([DATE]).

Partially imaged posterior spinal fusion hardware starting at the T3
level and extending inferiorly, terminating outside field view.
Associated susceptibility artifact limits evaluation.

Cord: Normal signal and morphology.

Posterior Fossa, vertebral arteries: Negative.

Disc levels: Multilevel desiccation and disc space loss.

C2-3: No significant disc bulge, spinal canal or neural foraminal
narrowing.

C3-4: Small disc osteophyte complex with uncovertebral degenerative
spurring. Patent spinal canal. Mild bilateral neural foraminal
narrowing.

C4-5: Disc osteophyte complex with superimposed bilateral
subarticular protrusions, uncovertebral and facet hypertrophy.
Partial effacement of the ventral CSF containing spaces. Patent
spinal canal. Moderate left and mild right neural foraminal
narrowing.

C5-6: Disc osteophyte complex with shallow central protrusion.
Uncovertebral and facet hypertrophy. Mild spinal canal and bilateral
neural foraminal narrowing.

C6-7: Disc osteophyte complex with uncovertebral and facet
hypertrophy. Mild spinal canal and bilateral neural foraminal
narrowing.

C7-T1: Disc osteophyte complex with uncovertebral and facet
hypertrophy. Partial effacement of the ventral CSF containing
spaces. Patent spinal canal. Mild left greater than right neural
foraminal narrowing.

T1-2: Disc osteophyte complex with uncovertebral and facet
hypertrophy. Patent spinal canal and left neural foramen. Mild right
neural foraminal narrowing.

Paraspinal tissues: Postsurgical appearance of the upper thoracic
paraspinal soft tissues.
IMPRESSION: Mild C5-7 spinal canal narrowing. Moderate left C4-5 neural
foraminal narrowing.

Mild bilateral C3-4, right C4-5, bilateral C5-T1 and right T1-2
neural foraminal narrowing.

Partially imaged thoracic fusion hardware.

## 2020-09-26 IMAGING — MR MR HIP*R* W/CM
5 of 6 series · 29 of 40 positions shown · IV contrast (agent unspecified)
Comparison: CT [DATE]. Radiographs [DATE]. Injection images
same date.

CLINICAL DATA: Right hip pain for 1 year following lumbar fusion.
Worsening pain after slipping 7 months ago. Limited weight bearing.

EXAM:
MRI OF THE RIGHT HIP WITH CONTRAST (MR Arthrogram)
TECHNIQUE: Multiplanar, multisequence MR imaging of the hip was performed
immediately following contrast injection into the hip joint under
fluoroscopic guidance. No intravenous contrast was administered.

[Series 8: T1 · coronal · right · 4.0mm · 0.74mm/px · 7 of 38 slices shown (1 of 2)]
[im 1/38]
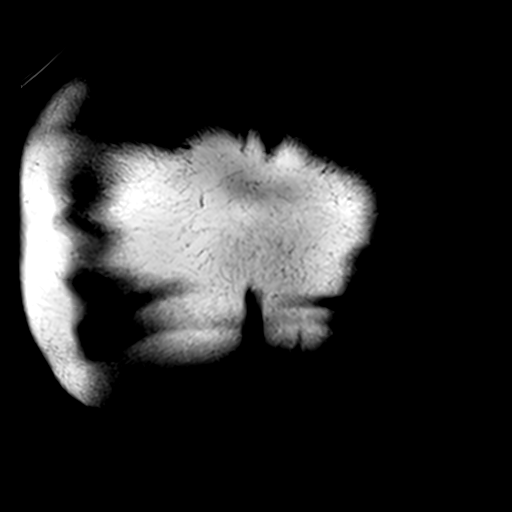
[im 7/38]
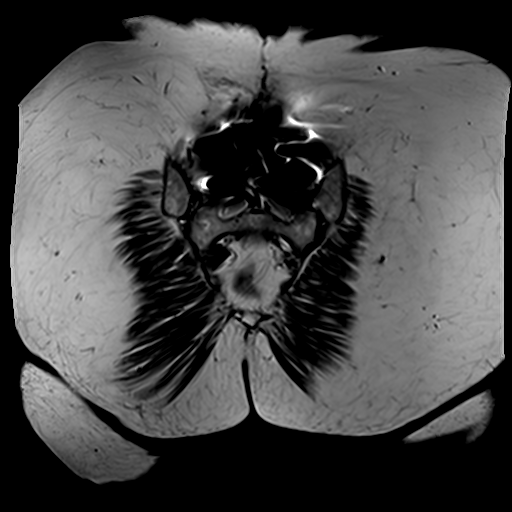
[im 13/38]
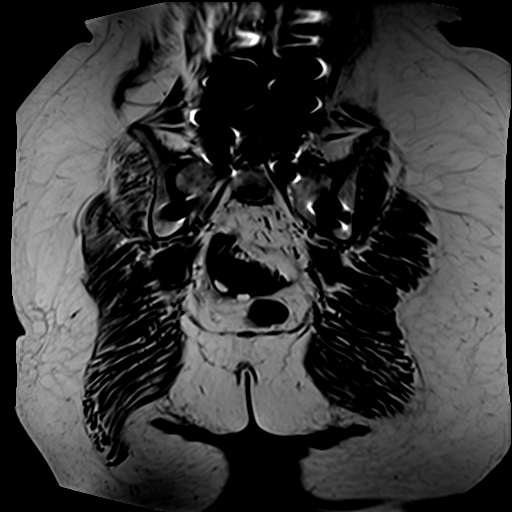
[im 19/38]
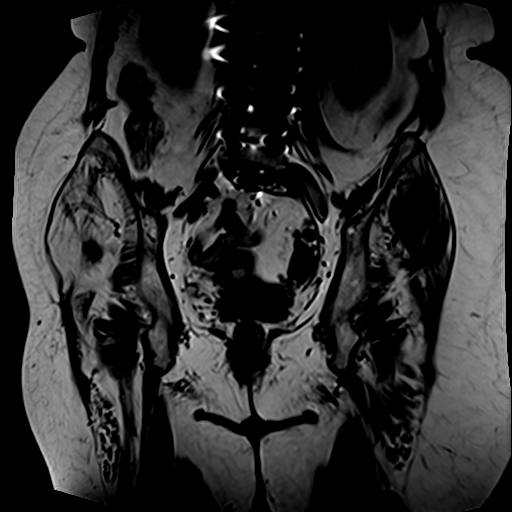
[im 25/38]
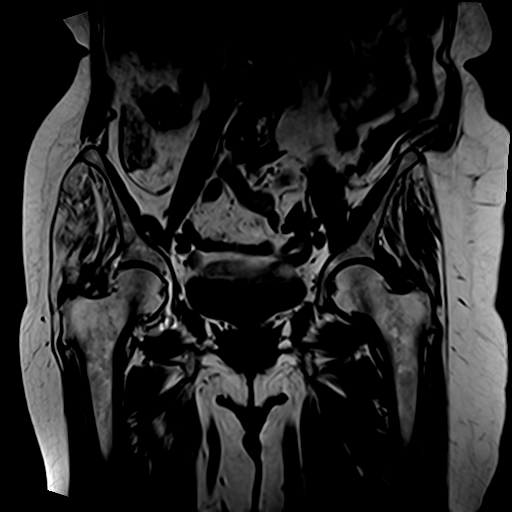
[im 31/38]
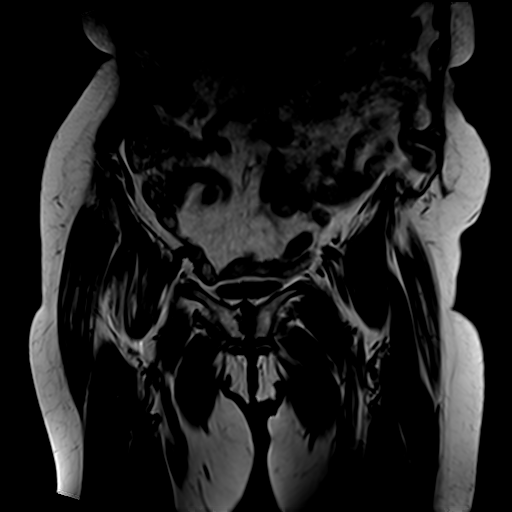
[im 38/38]
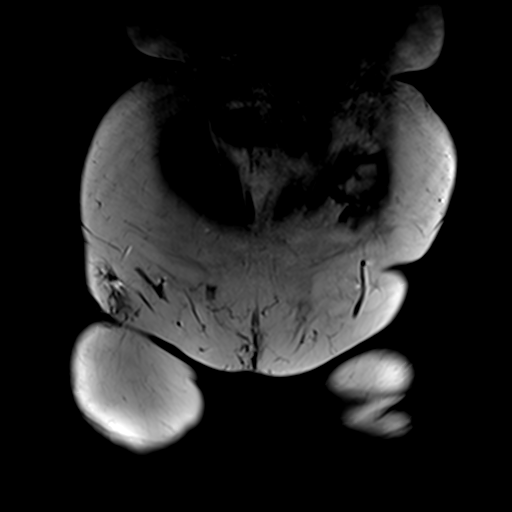

[Series 10: T2 fat-sat · axial · right · 4.0mm · 0.70mm/px · z∈[-31,+114]mm · 6 of 30 slices shown]
[im 1/30]
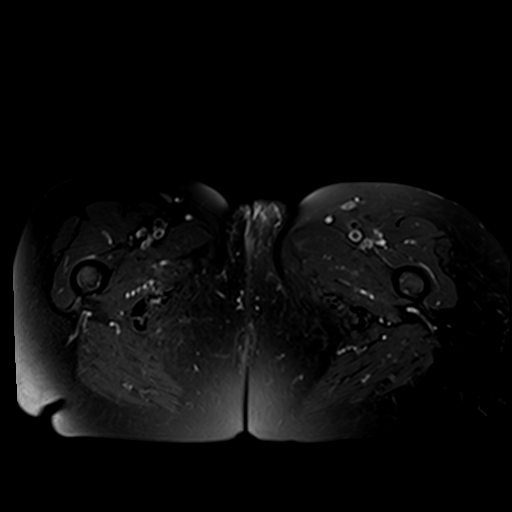
[im 6/30]
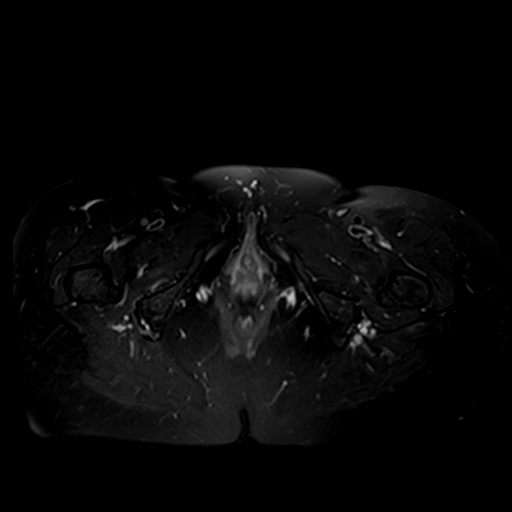
[im 12/30]
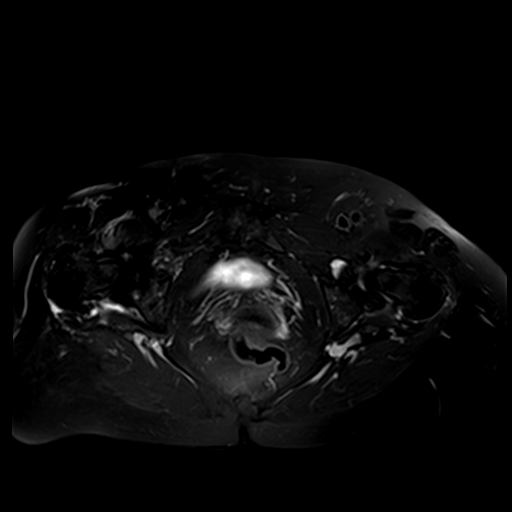
[im 18/30]
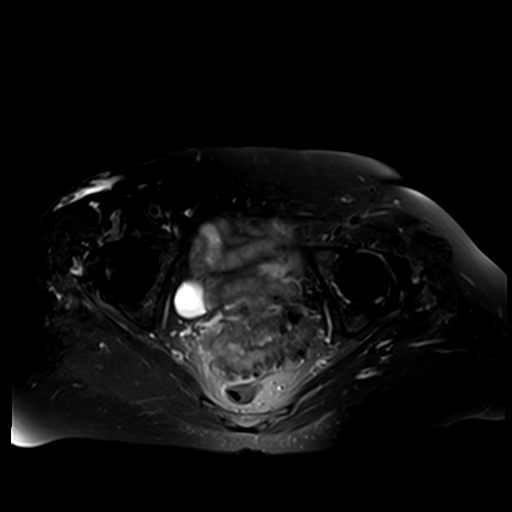
[im 24/30]
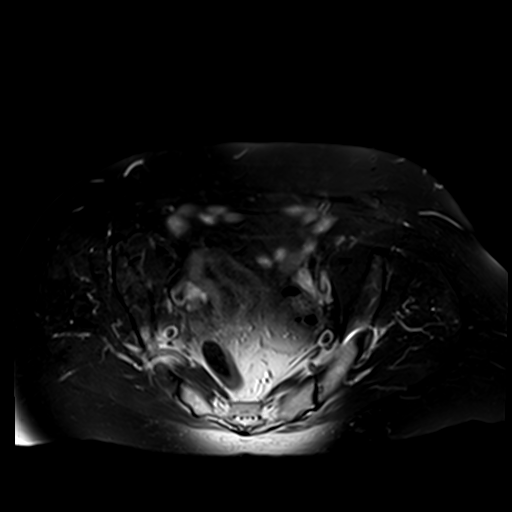
[im 30/30]
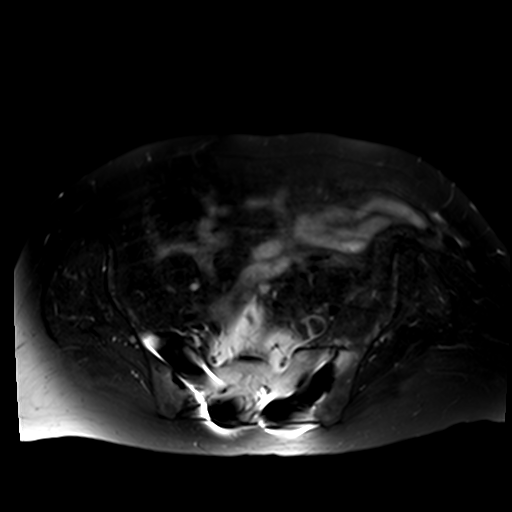

[Series 11: T1 · axial · right · 4.0mm · 0.70mm/px · z∈[-61,+60]mm · 6 of 30 slices shown (2 of 2)]
[im 1/30]
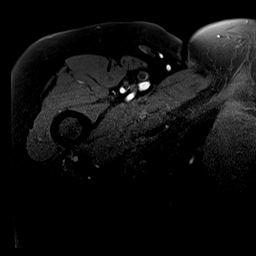
[im 6/30]
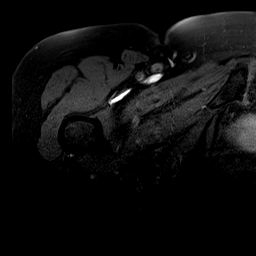
[im 12/30]
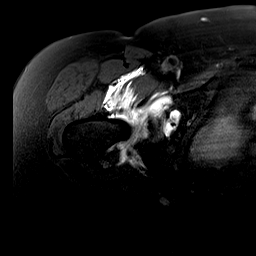
[im 18/30]
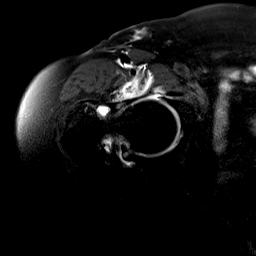
[im 24/30]
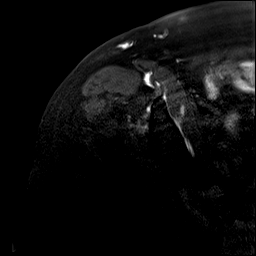
[im 30/30]
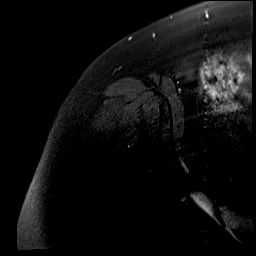

[Series 12: T1 fat-sat · sagittal · right · 4.0mm · 0.77mm/px · 5 of 25 slices shown (1 of 2)]
[im 1/25]
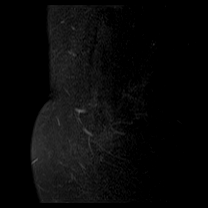
[im 7/25]
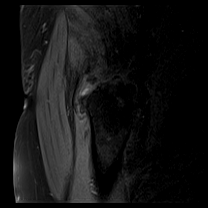
[im 13/25]
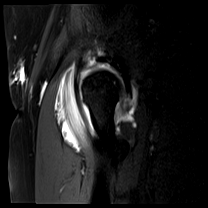
[im 19/25]
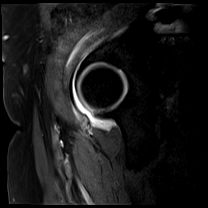
[im 25/25]
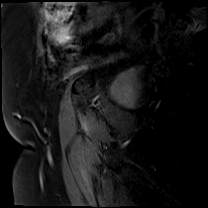

[Series 13: T1 fat-sat · coronal · right · 4.0mm · 0.47mm/px · 5 of 40 slices shown (2 of 2)]
[im 1/40]
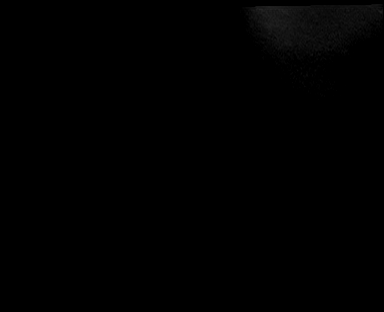
[im 6/40]
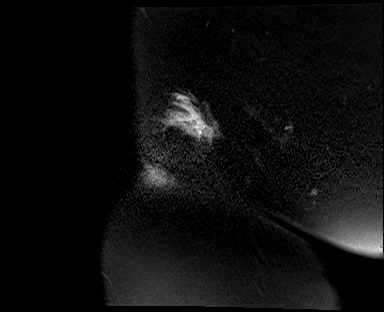
[im 12/40]
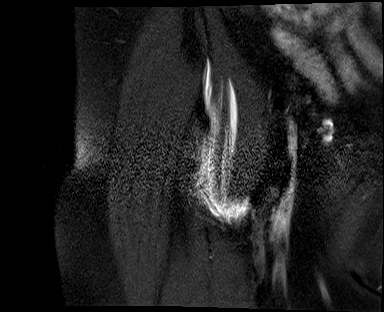
[im 17/40]
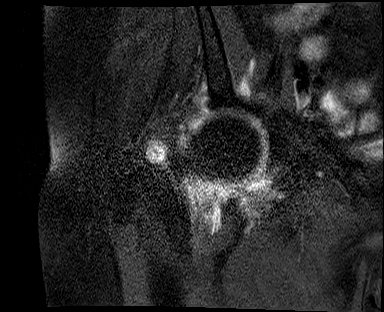
[im 23/40]
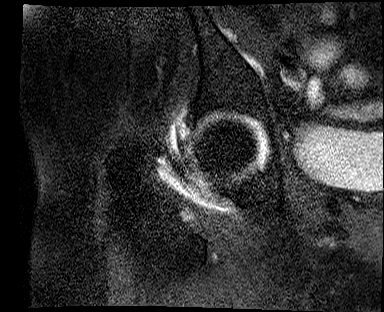

[29 of 40 positions shown; findings below may reference images not displayed]

FINDINGS: Bones: There is no evidence of acute fracture, dislocation or
avascular necrosis. The visualized bony pelvis appears normal.
Patient is status post lower lumbosacral fusion with single screws
traversing both sacroiliac joints. There are mild underlying
degenerative changes at the sacroiliac joints and symphysis pubis.

Articular cartilage and labrum

Articular cartilage: Fairly symmetric degenerative changes at both
hips with chondral thinning and osteophytes.

Labrum: Right labral degeneration with probable degenerative tearing
posteriorly, best seen on the oblique axial images. No significant
paralabral cyst on the right. Possible 9 mm paralabral cyst on the
left, best seen on coronal image [DATE]. Underlying labral
chondrocalcinosis bilaterally as correlated with previous CT.

Joint or bursal effusion

Joint effusion: The right hip joint is adequately distended with
contrast. There is some leakage from the joint at the injection
site.

Bursae: Small amount of asymmetric fluid in the right trochanteric
bursa.

Muscles and tendons

Muscles and tendons: Chronic advanced fatty atrophy of the right
gluteus minimus and medius muscles. The right gluteus minimus tendon
is torn and partially retracted with adjacent fluid, best seen on
the coronal T2 weighted images. Bilateral common hamstring
tendinosis. The piriformis muscles appear symmetric.

Other findings

Miscellaneous: Diverticular changes throughout the sigmoid colon.
IMPRESSION: 1. No acute findings or explanation for the patient's symptoms.
2. Chronic tear of the right gluteus minimus tendon with advanced
fatty atrophy of the right gluteus minimus and medius muscles.
3. Fairly symmetric degenerative changes at both hips with probable
degenerative tearing of the right acetabular labrum posteriorly.
Possible 9 mm paralabral cyst on the left.
4. Bilateral common hamstring tendinosis.
5. Postsurgical changes from lower lumbosacral fusion.

## 2020-09-26 MED ORDER — SODIUM CHLORIDE (PF) 0.9 % IJ SOLN
10.0000 mL | Freq: Once | INTRAMUSCULAR | Status: AC
  Administered 2020-09-26: 10 mL

## 2020-09-26 MED ORDER — GADOBUTROL 1 MMOL/ML IV SOLN
0.1000 mL | Freq: Once | INTRAVENOUS | Status: AC | PRN
  Administered 2020-09-26: 0.5 mL

## 2020-09-26 MED ORDER — LIDOCAINE HCL (PF) 1 % IJ SOLN
5.0000 mL | Freq: Once | INTRAMUSCULAR | Status: AC
  Administered 2020-09-26: 5 mL
  Filled 2020-09-26: qty 5

## 2020-09-26 MED ORDER — IOHEXOL 180 MG/ML  SOLN
20.0000 mL | Freq: Once | INTRAMUSCULAR | Status: AC
  Administered 2020-09-26: 15 mL

## 2020-09-27 DIAGNOSIS — M810 Age-related osteoporosis without current pathological fracture: Secondary | ICD-10-CM | POA: Diagnosis not present

## 2020-09-29 ENCOUNTER — Other Ambulatory Visit: Payer: Self-pay

## 2020-09-29 ENCOUNTER — Ambulatory Visit
Admission: RE | Admit: 2020-09-29 | Discharge: 2020-09-29 | Disposition: A | Discharge status: Home / Self Care | Payer: Medicare Other | Source: Ambulatory Visit | Attending: Neurosurgery | Admitting: Neurosurgery

## 2020-09-29 DIAGNOSIS — M47812 Spondylosis without myelopathy or radiculopathy, cervical region: Secondary | ICD-10-CM | POA: Diagnosis not present

## 2020-09-29 DIAGNOSIS — Z853 Personal history of malignant neoplasm of breast: Secondary | ICD-10-CM | POA: Diagnosis not present

## 2020-09-29 DIAGNOSIS — M546 Pain in thoracic spine: Secondary | ICD-10-CM | POA: Insufficient documentation

## 2020-09-29 DIAGNOSIS — M47814 Spondylosis without myelopathy or radiculopathy, thoracic region: Secondary | ICD-10-CM | POA: Diagnosis not present

## 2020-09-29 DIAGNOSIS — M2560 Stiffness of unspecified joint, not elsewhere classified: Secondary | ICD-10-CM | POA: Diagnosis not present

## 2020-09-29 DIAGNOSIS — M4324 Fusion of spine, thoracic region: Secondary | ICD-10-CM | POA: Diagnosis not present

## 2020-09-29 IMAGING — CT CT T SPINE W/O CM
3 of 4 series · 10 of 33 positions shown, 11 images · non-contrast
Comparison: CT thoracic spine [DATE]

CLINICAL DATA: Thoracic spine fusion. Acute left-sided thoracic
back pain. History of breast cancer.

EXAM:
CT THORACIC SPINE WITHOUT CONTRAST
TECHNIQUE: Multidetector CT images of the thoracic were obtained using the
standard protocol without intravenous contrast.

[Series 3: (person_name) 2.00 ax · axial · 0.27mm/px · z∈[-933,-841]mm · 2 of 139 slices shown, 3 images]
[im 47/139  soft-tissue]
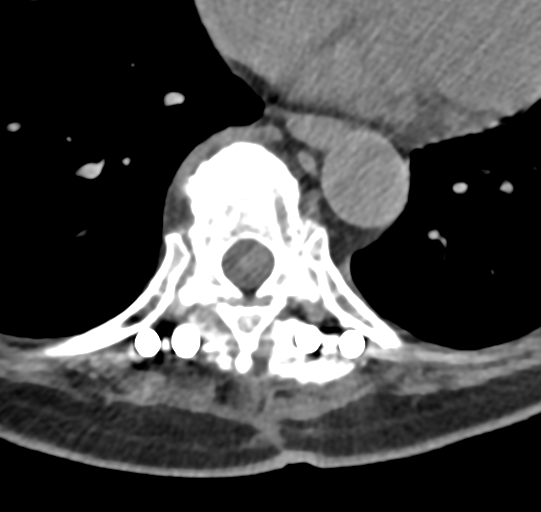
[im 47/139  bone]
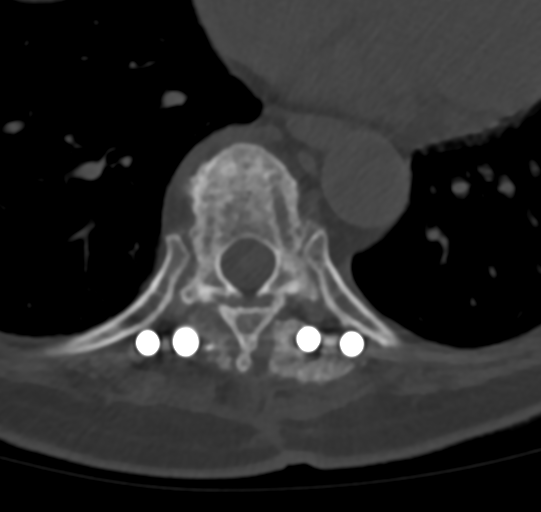
[im 93/139  bone]
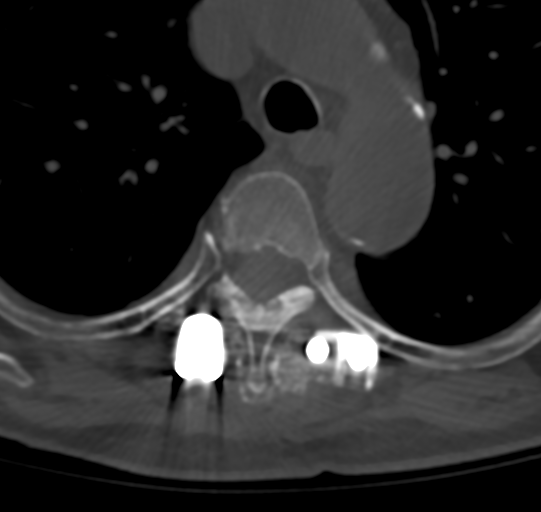

[Series 7: t-spine 2.00 sag · sagittal · 0.27mm/px · 5 of 74 slices shown]
[im 25/74  bone]
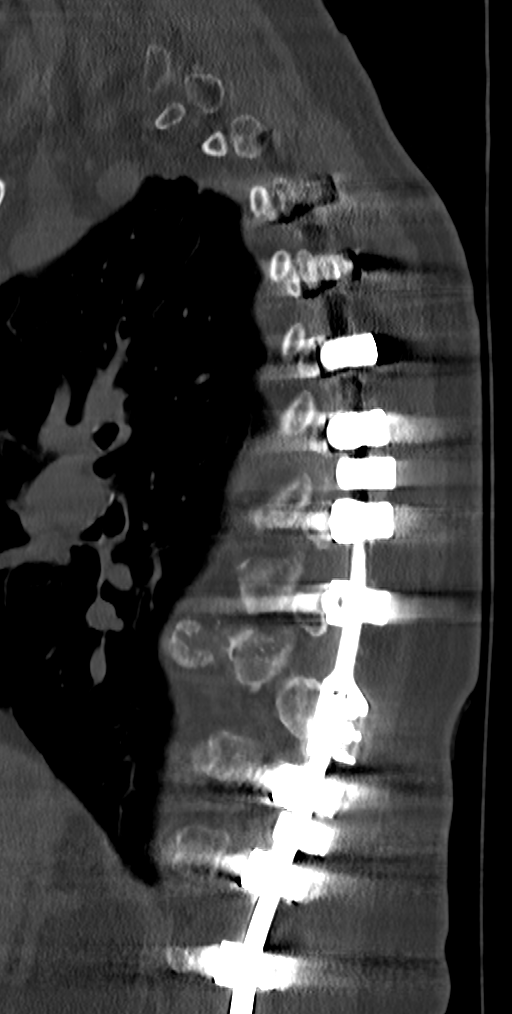
[im 31/74  bone]
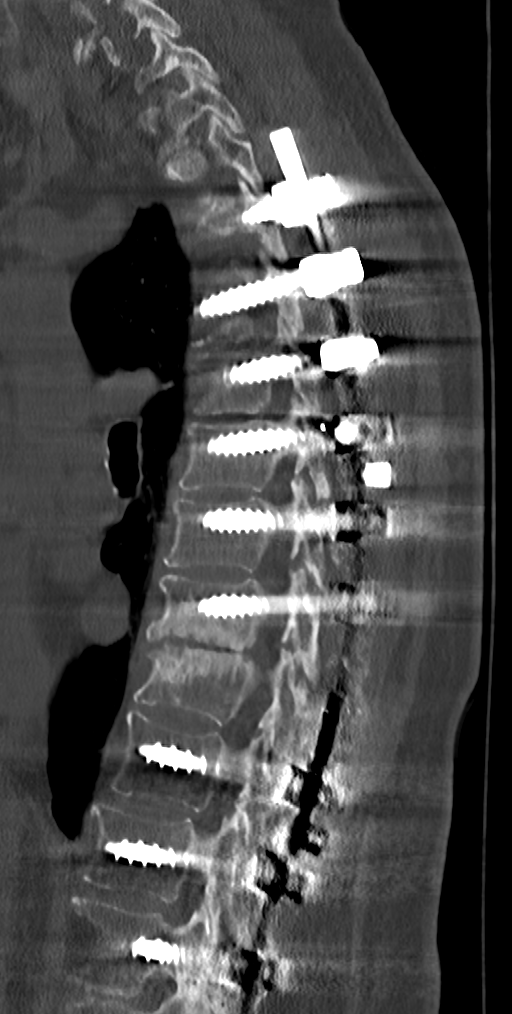
[im 37/74  bone]
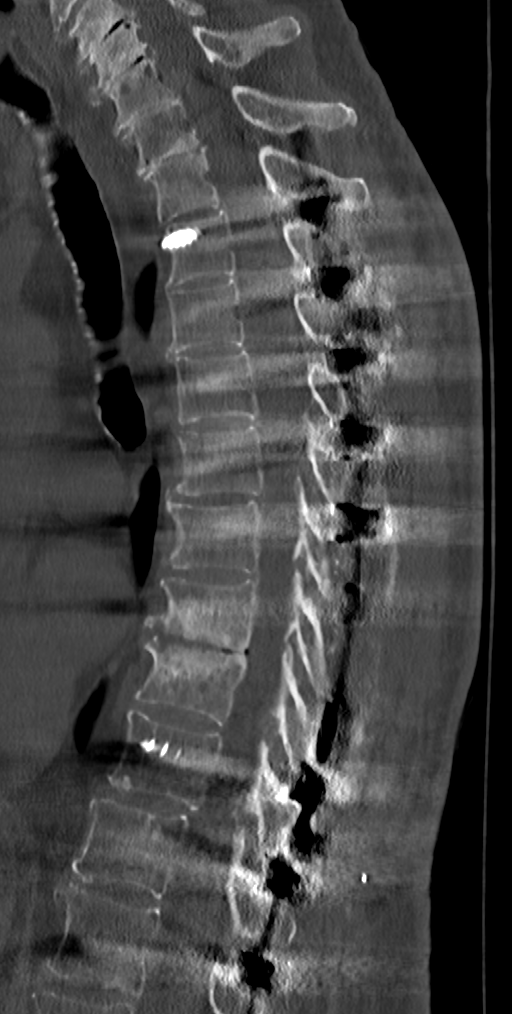
[im 43/74  bone]
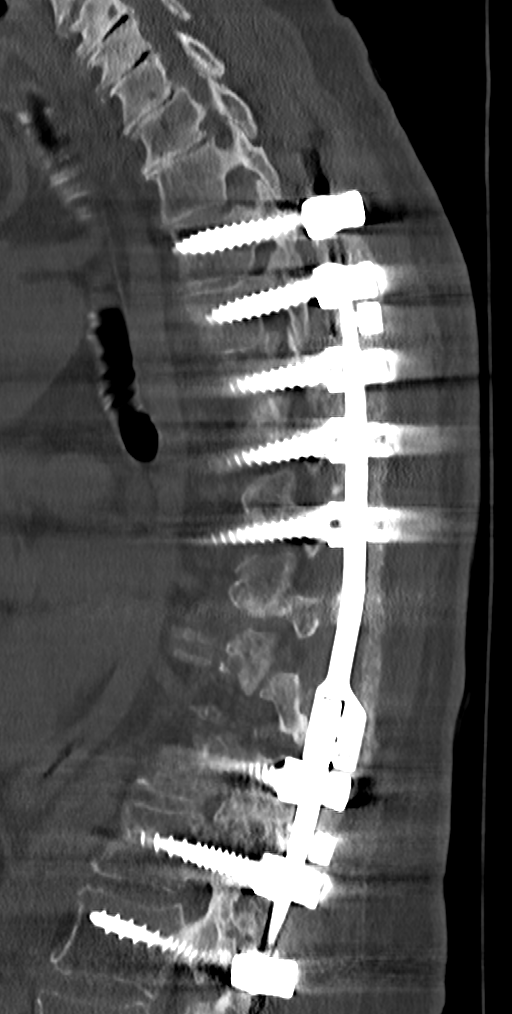
[im 49/74  bone]
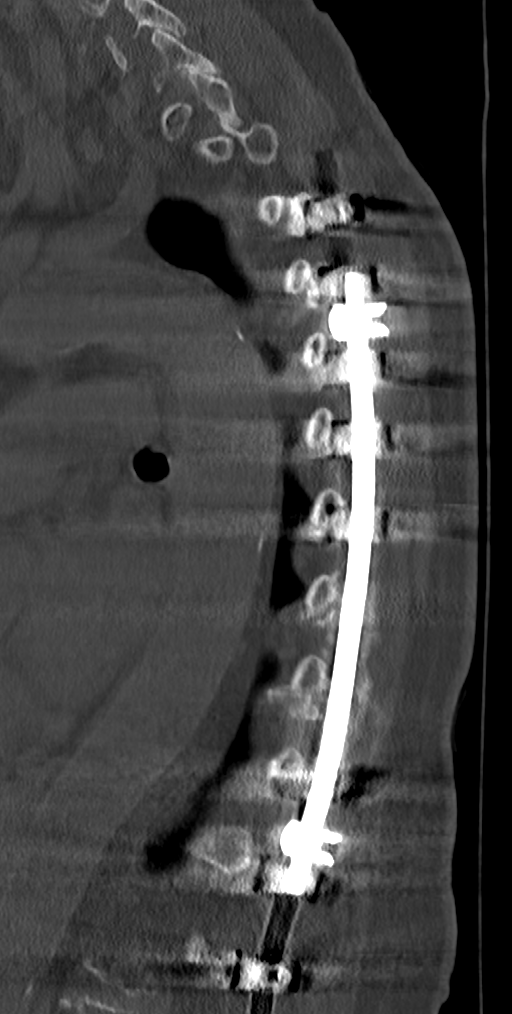

[Series 9: t-spine 2.00 cor · coronal · 0.29mm/px · 3 of 70 slices shown]
[im 14/70  bone]
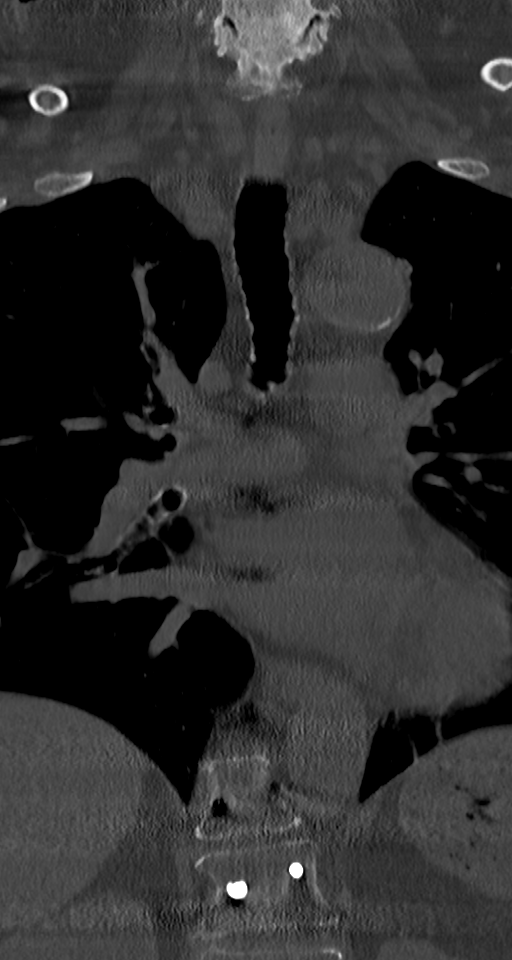
[im 28/70  bone]
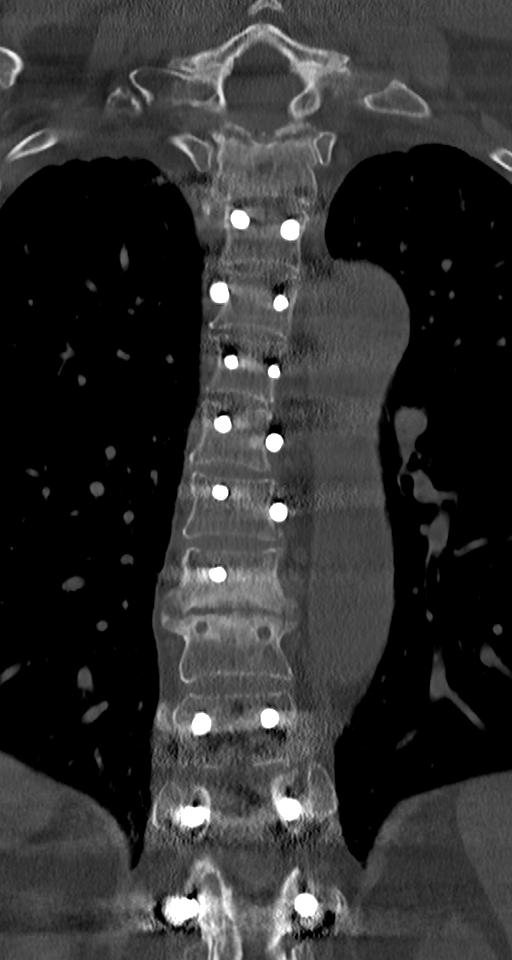
[im 42/70  bone]
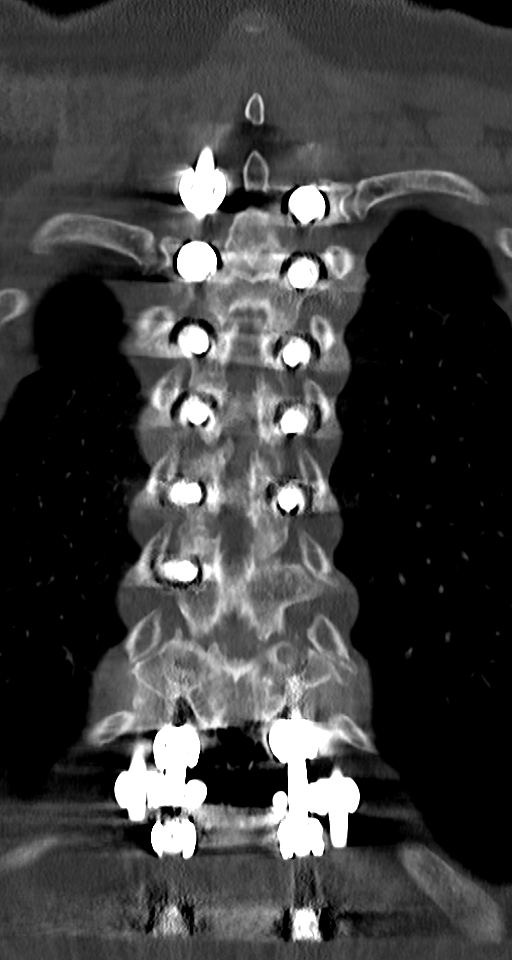

[10 of 33 positions shown; findings below may reference images not displayed]

FINDINGS: Alignment: Mild anterolisthesis T2-3.  Slight retrolisthesis T6-7.

Vertebrae: Negative for fracture or mass.

Hardware: Bilateral pedicle screw fusion extending from T3 into the
lumbar spine. Interval removal of screws at T9. No screw placed on
the left at T8. Hardware in satisfactory position. No hardware
failure. Bone graft material in the posterior soft tissues
throughout the fusion levels.

Paraspinal and other soft tissues: Negative for paraspinous mass or
hematoma. Visualized lungs clear. Mild atherosclerotic calcification
aortic arch.

Disc levels: Advanced cervical spondylosis with disc degeneration
and spurring C5-6 and C6-7 and C7-T1.

T1-2: Disc degeneration with diffuse endplate spurring. Mild facet
degeneration. Mild left foraminal narrowing due to spurring.

T2-3: Negative

T3-4: Mild facet degeneration.  Negative for stenosis

T4-5: Mild facet degeneration.  Negative for stenosis

T5-6: Mild facet degeneration.  Negative for stenosis.

T6-7: Slight retrolisthesis with disc and facet degeneration. Right
foraminal narrowing due to spurring.

T7-8: Bilateral facet degeneration.  Negative for stenosis.

T8-9: Advanced disc degeneration with disc space narrowing and
diffuse endplate sclerosis similar to the prior study. Mild facet
degeneration. No significant spinal or foraminal stenosis

T9-10: Negative for stenosis

T10-11: Negative for stenosis.  Solid posterior fusion.

T11-12: Solid posterior bony fusion.  Negative for stenosis.
IMPRESSION: Interval extension of thoracic fusion. Bilateral pedicle screw
fusion extends from T3 into the lumbar spine. Hardware in
satisfactory position. Screws have been removed from the T9
vertebral body since the prior study.

Multilevel degenerative change in the thoracic spine. No significant
thoracic stenosis.

Advanced cervical spondylosis.

## 2020-10-02 ENCOUNTER — Other Ambulatory Visit: Payer: Self-pay

## 2020-10-02 ENCOUNTER — Ambulatory Visit
Admission: RE | Admit: 2020-10-02 | Discharge: 2020-10-02 | Disposition: A | Discharge status: Home / Self Care | Payer: Medicare Other | Source: Ambulatory Visit | Attending: Pain Medicine | Admitting: Pain Medicine

## 2020-10-02 ENCOUNTER — Encounter: Payer: Self-pay | Admitting: Pain Medicine

## 2020-10-02 ENCOUNTER — Ambulatory Visit (HOSPITAL_BASED_OUTPATIENT_CLINIC_OR_DEPARTMENT_OTHER): Payer: Medicare Other | Admitting: Pain Medicine

## 2020-10-02 VITALS — BP 142/62 | HR 90 | Temp 97.7°F | Resp 16 | Ht 59.0 in | Wt 127.0 lb

## 2020-10-02 DIAGNOSIS — M25551 Pain in right hip: Secondary | ICD-10-CM

## 2020-10-02 DIAGNOSIS — M25512 Pain in left shoulder: Secondary | ICD-10-CM

## 2020-10-02 DIAGNOSIS — Z96619 Presence of unspecified artificial shoulder joint: Secondary | ICD-10-CM

## 2020-10-02 DIAGNOSIS — Z9889 Other specified postprocedural states: Secondary | ICD-10-CM | POA: Diagnosis not present

## 2020-10-02 DIAGNOSIS — M546 Pain in thoracic spine: Secondary | ICD-10-CM

## 2020-10-02 DIAGNOSIS — M25562 Pain in left knee: Secondary | ICD-10-CM

## 2020-10-02 DIAGNOSIS — M25511 Pain in right shoulder: Secondary | ICD-10-CM

## 2020-10-02 DIAGNOSIS — M5412 Radiculopathy, cervical region: Secondary | ICD-10-CM | POA: Insufficient documentation

## 2020-10-02 DIAGNOSIS — M79604 Pain in right leg: Secondary | ICD-10-CM | POA: Insufficient documentation

## 2020-10-02 DIAGNOSIS — G8929 Other chronic pain: Secondary | ICD-10-CM | POA: Insufficient documentation

## 2020-10-02 DIAGNOSIS — G894 Chronic pain syndrome: Secondary | ICD-10-CM

## 2020-10-02 DIAGNOSIS — Z471 Aftercare following joint replacement surgery: Secondary | ICD-10-CM | POA: Diagnosis not present

## 2020-10-02 DIAGNOSIS — M19012 Primary osteoarthritis, left shoulder: Secondary | ICD-10-CM | POA: Diagnosis not present

## 2020-10-02 DIAGNOSIS — M25519 Pain in unspecified shoulder: Secondary | ICD-10-CM | POA: Insufficient documentation

## 2020-10-02 DIAGNOSIS — M542 Cervicalgia: Secondary | ICD-10-CM | POA: Insufficient documentation

## 2020-10-02 DIAGNOSIS — M25552 Pain in left hip: Secondary | ICD-10-CM | POA: Insufficient documentation

## 2020-10-02 DIAGNOSIS — M25712 Osteophyte, left shoulder: Secondary | ICD-10-CM | POA: Diagnosis not present

## 2020-10-02 IMAGING — CR DG SHOULDER 2+V*R*
1 series · 3 of 3 positions shown · non-contrast
Comparison: None.

CLINICAL DATA: Chronic pain in both shoulders. No known injury.

EXAM:
RIGHT SHOULDER - 2+ VIEW

[Series 1: dg shoulder right · 0.14mm/px · 3 of 3 slices shown]
[im 1/3]
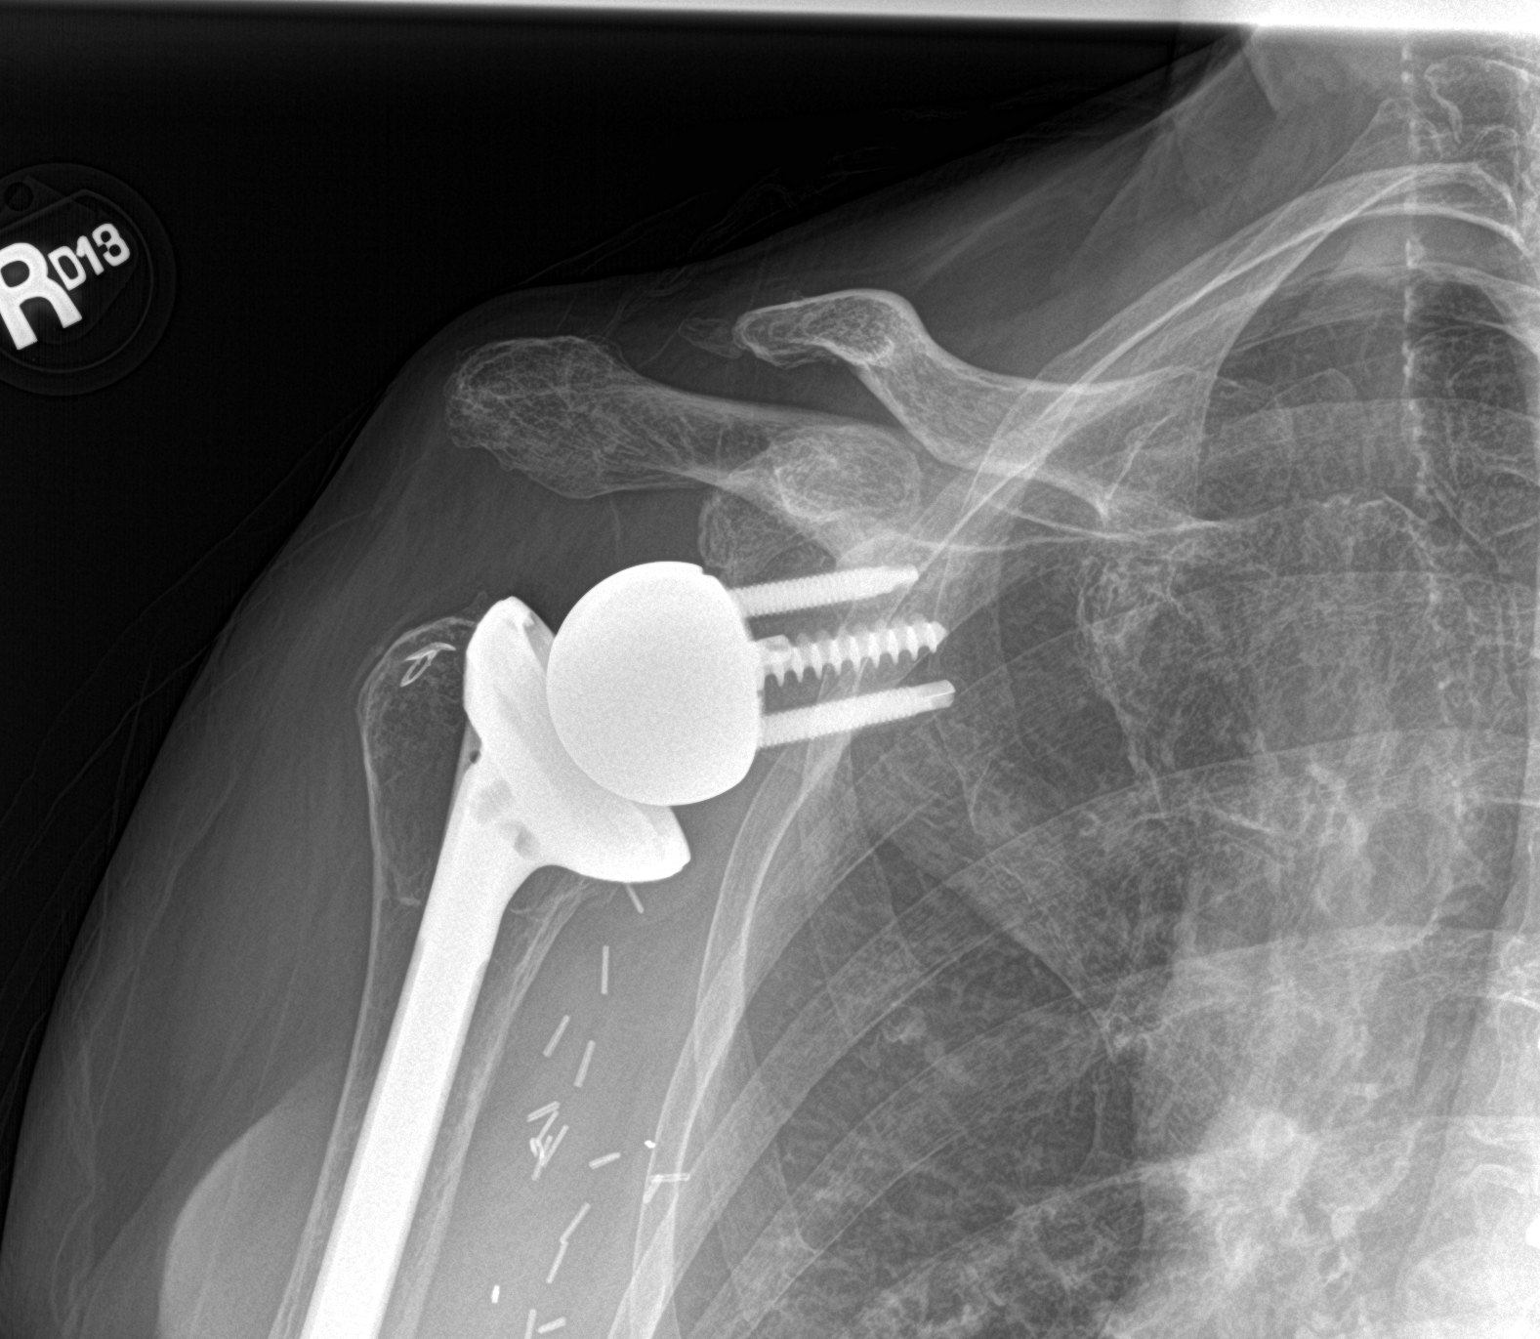
[im 2/3]
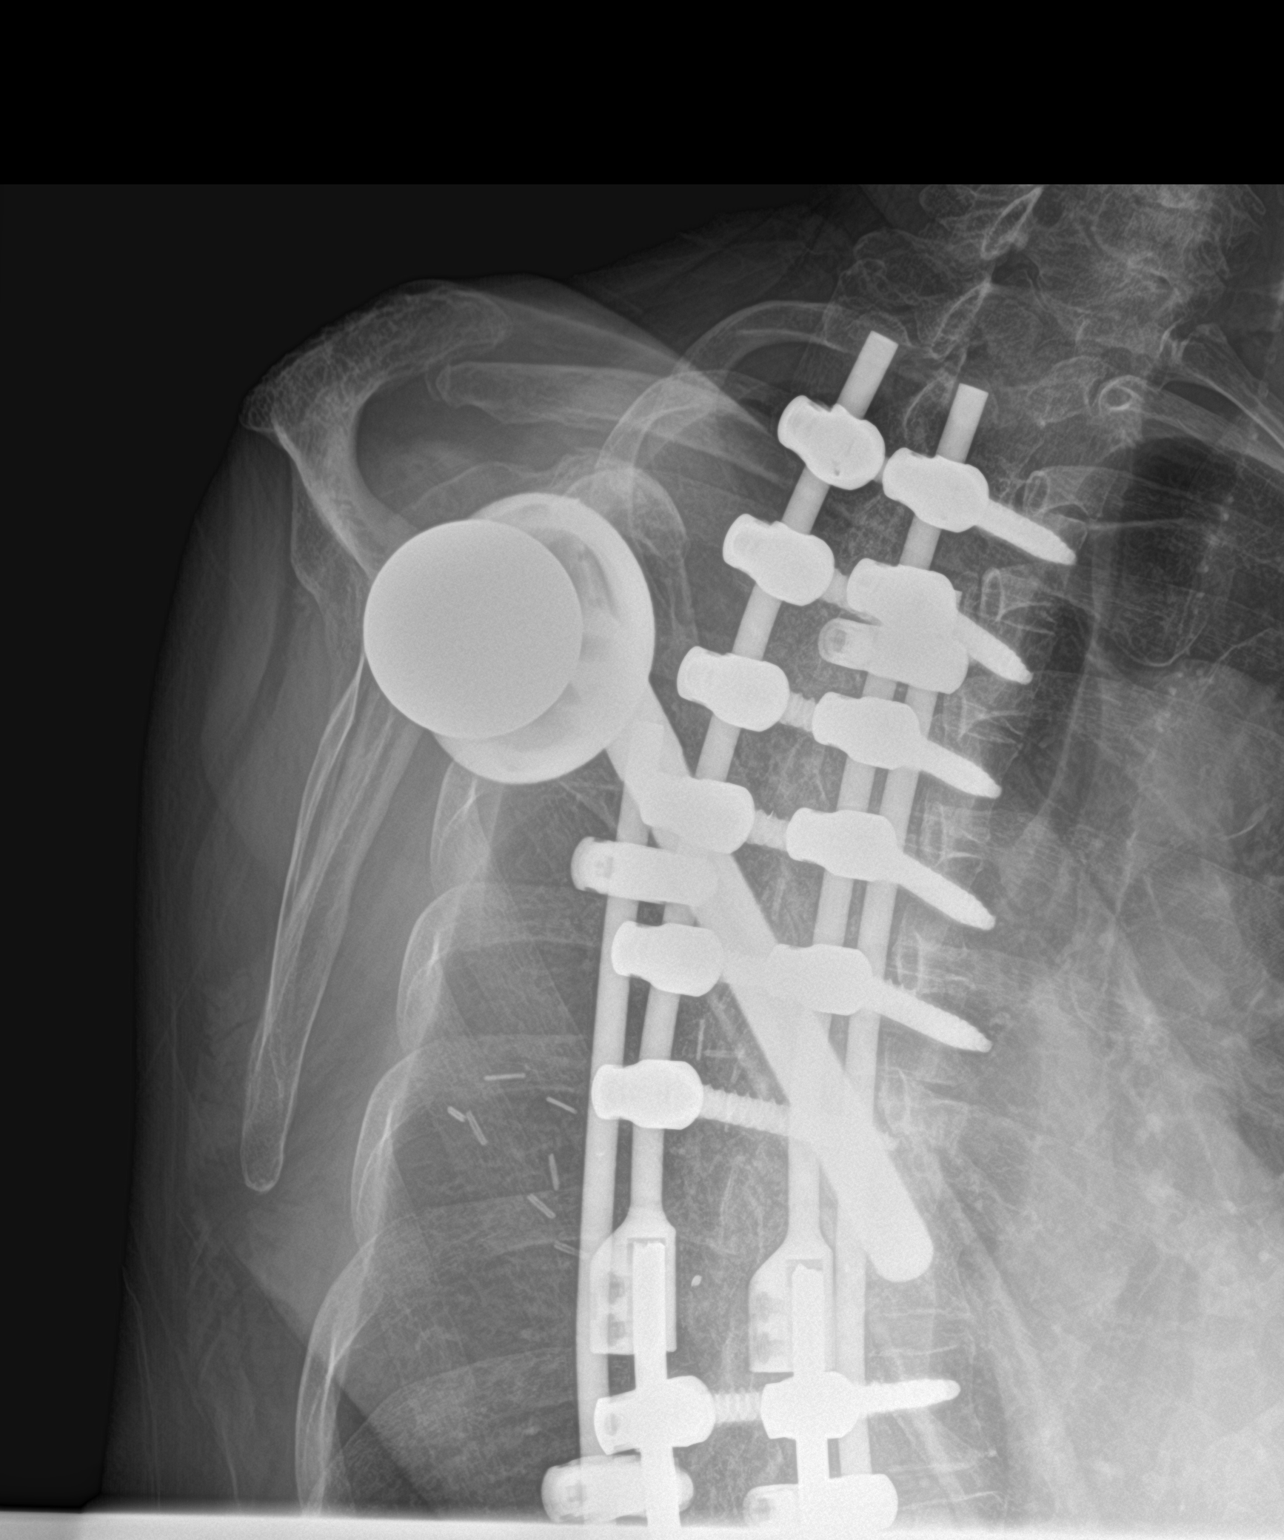
[im 3/3]
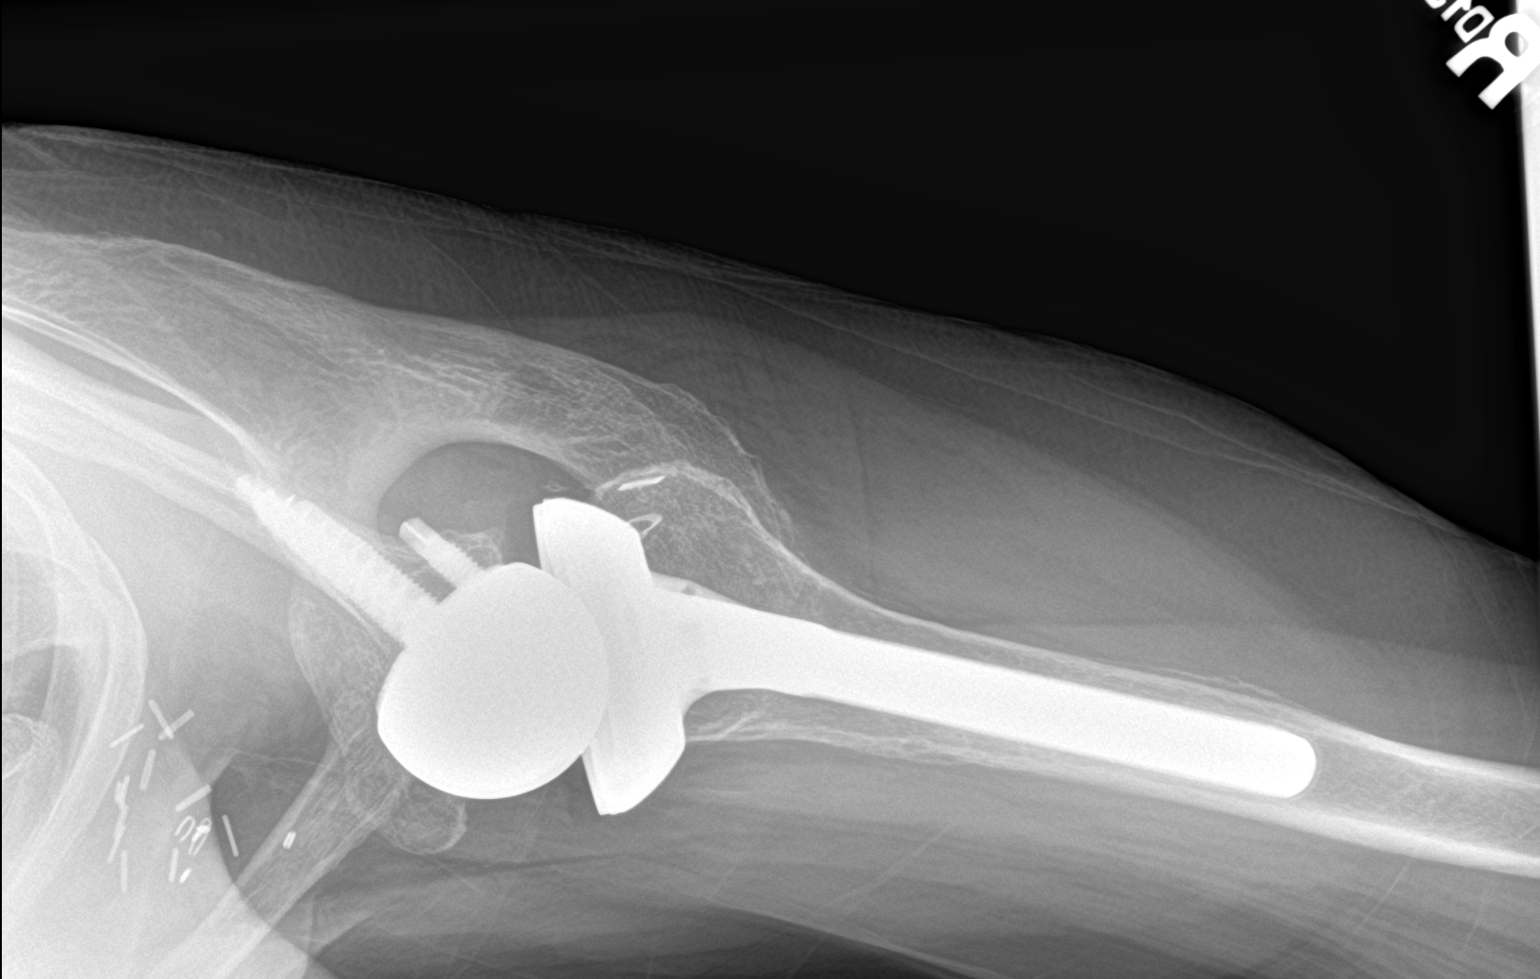

[3 of 3 positions shown; findings below may reference images not displayed]

FINDINGS: Reverse shoulder arthroplasty. No periprosthetic lucency or
fracture. The humeral stem is midline. Widening of the
acromioclavicular joint is presumably postsurgical. There is no
evidence of focal bone lesion or bony destruction. Multiple surgical
clips in the axilla.
IMPRESSION: 1. Reverse shoulder arthroplasty without evidence of complication.
2. Widening of the acromioclavicular joint is presumably
postsurgical.

## 2020-10-02 IMAGING — CR DG SHOULDER 2+V*L*
1 series · 3 of 3 positions shown · non-contrast
Comparison: None.

CLINICAL DATA: Chronic pain in both shoulders. No known injury.

EXAM:
LEFT SHOULDER - 2+ VIEW

[Series 1: dg shoulder left · 0.14mm/px · 3 of 3 slices shown]
[im 1/3]
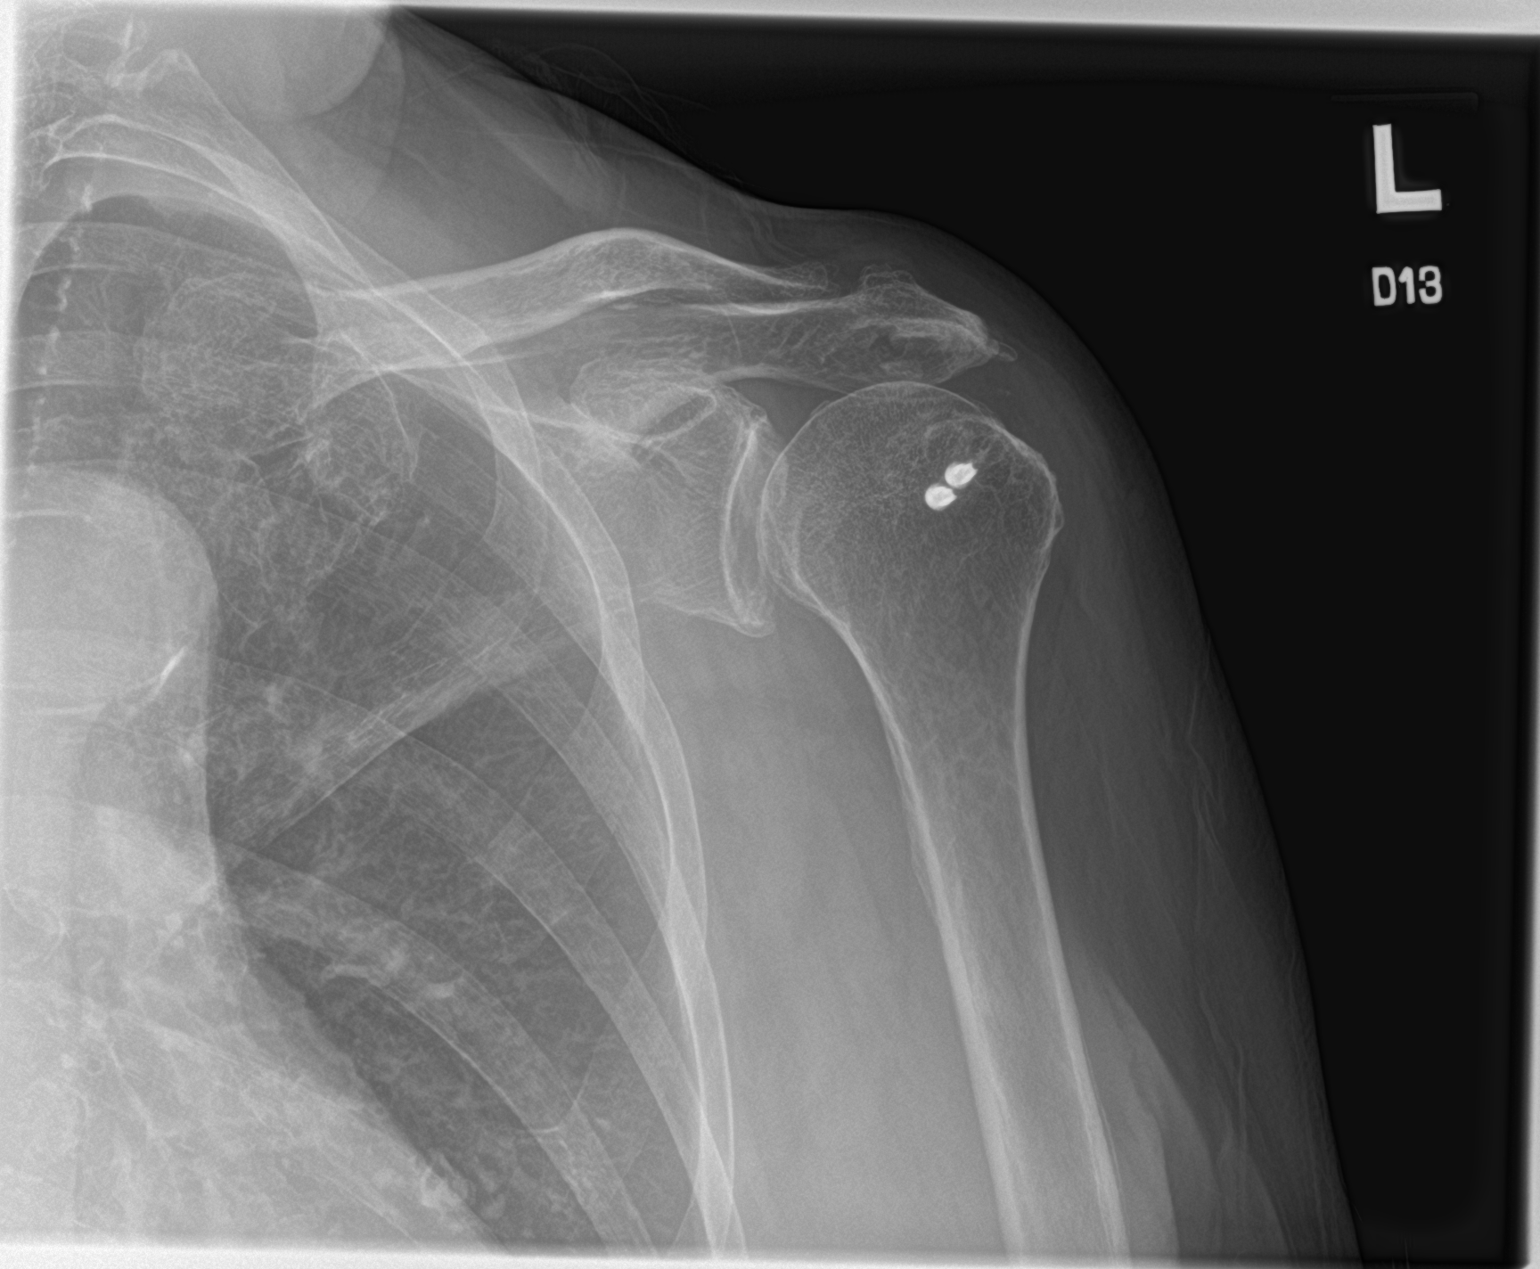
[im 2/3]
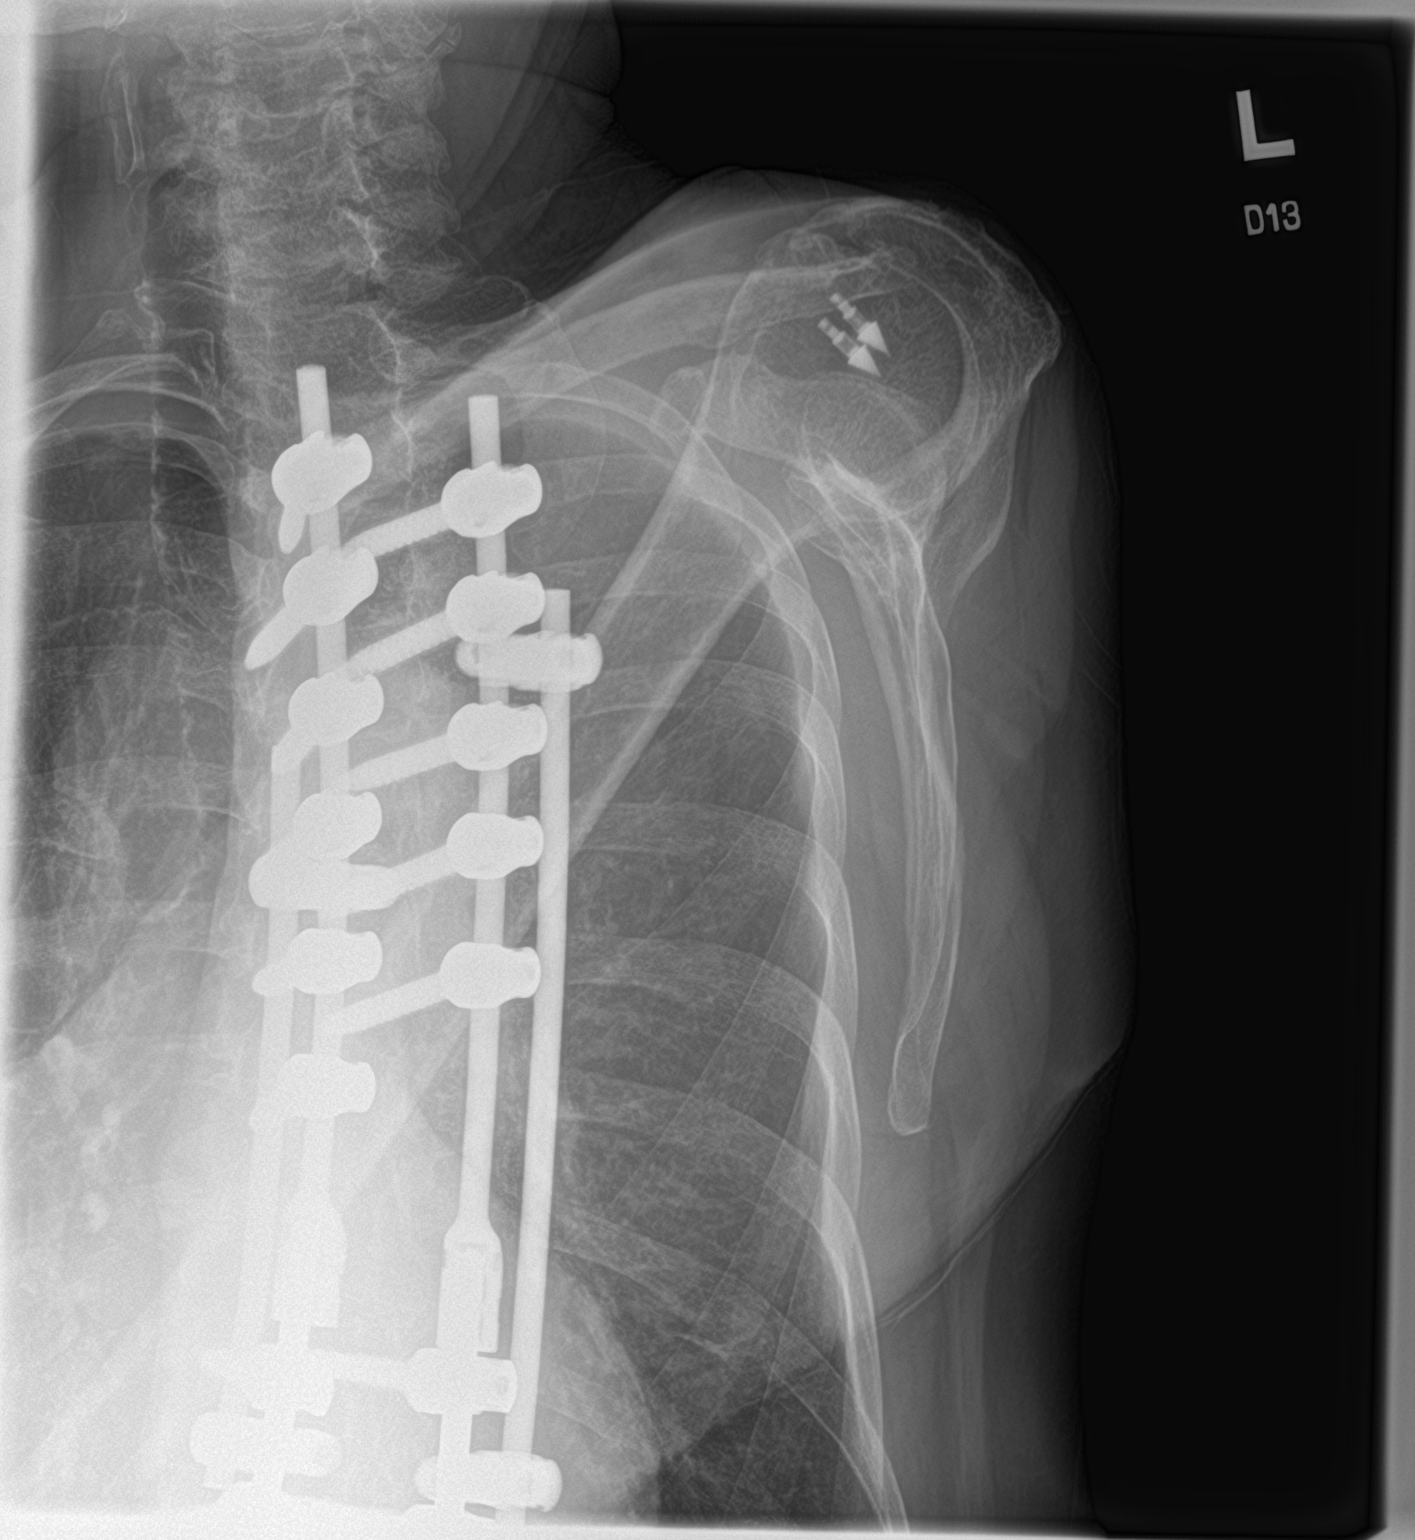
[im 3/3]
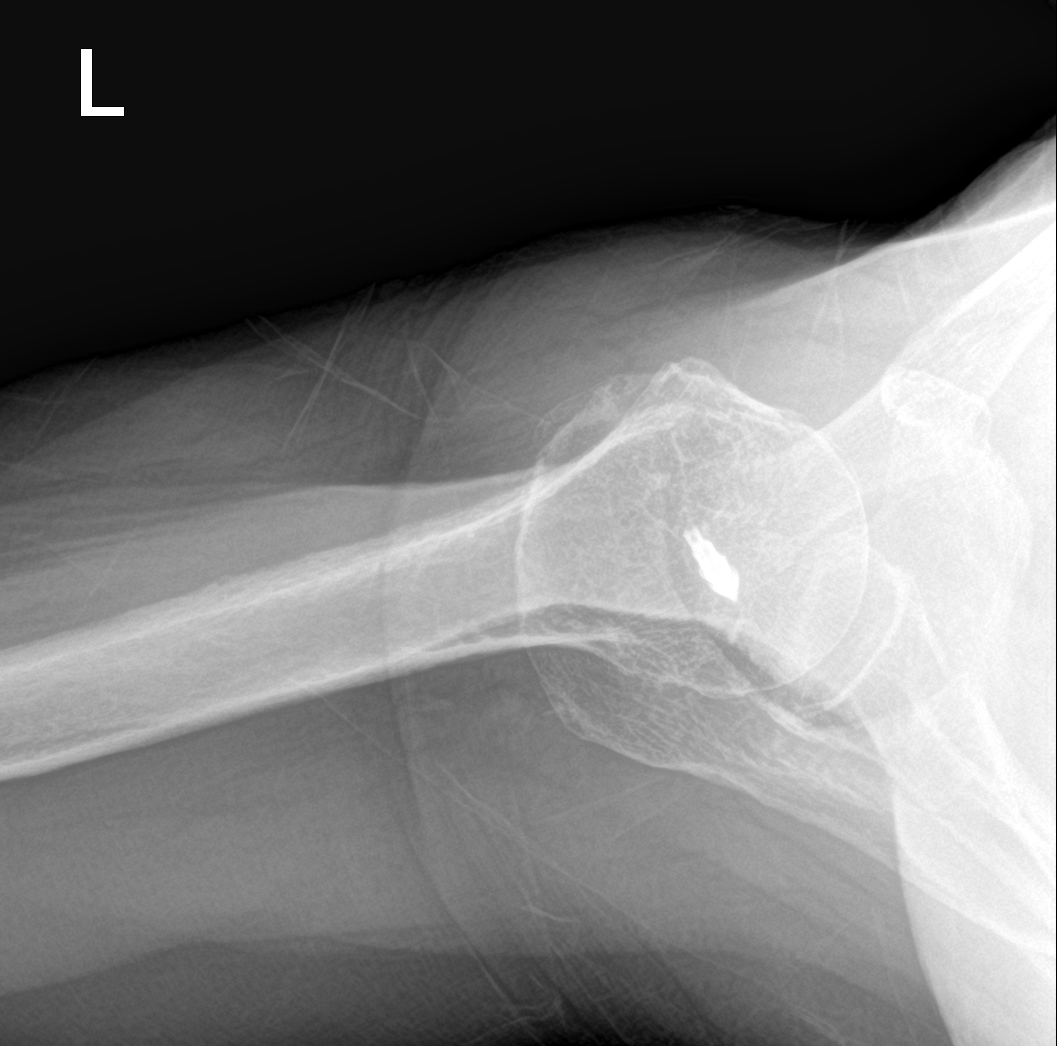

[3 of 3 positions shown; findings below may reference images not displayed]

FINDINGS: Tacks in the humeral head from presumed rotator cuff repair. There
is mild acromioclavicular spurring with inferior osteophytes and
small subacromial spur. Mild glenoid spurring with preservation of
joint space. No erosion, evidence of focal bone lesion or fracture.
Soft tissues are unremarkable.
IMPRESSION: 1. Mild degenerative acromioclavicular and glenohumeral
osteoarthritis. Small subacromial spur.
2. Tacks in the humeral head from presumed rotator cuff repair.

## 2020-10-02 NOTE — Patient Instructions (Signed)
____________________________________________________________________________________________  Preparing for your procedure (without sedation)  Procedure appointments are limited to planned procedures: . No Prescription Refills. . No disability issues will be discussed. . No medication changes will be discussed.  Instructions: . Oral Intake: Do not eat or drink anything for at least 6 hours prior to your procedure. (Exception: Blood Pressure Medication. See below.) . Transportation: Unless otherwise stated by your physician, you may drive yourself after the procedure. . Blood Pressure Medicine: Do not forget to take your blood pressure medicine with a sip of water the morning of the procedure. If your Diastolic (lower reading)is above 100 mmHg, elective cases will be cancelled/rescheduled. . Blood thinners: These will need to be stopped for procedures. Notify our staff if you are taking any blood thinners. Depending on which one you take, there will be specific instructions on how and when to stop it. . Diabetics on insulin: Notify the staff so that you can be scheduled 1st case in the morning. If your diabetes requires high dose insulin, take only  of your normal insulin dose the morning of the procedure and notify the staff that you have done so. . Preventing infections: Shower with an antibacterial soap the morning of your procedure.  . Build-up your immune system: Take 1000 mg of Vitamin C with every meal (3 times a day) the day prior to your procedure. . Antibiotics: Inform the staff if you have a condition or reason that requires you to take antibiotics before dental procedures. . Pregnancy: If you are pregnant, call and cancel the procedure. . Sickness: If you have a cold, fever, or any active infections, call and cancel the procedure. . Arrival: You must be in the facility at least 30 minutes prior to your scheduled procedure. . Children: Do not bring any children with you. . Dress  appropriately: Bring dark clothing that you would not mind if they get stained. . Valuables: Do not bring any jewelry or valuables.  Reasons to call and reschedule or cancel your procedure: (Following these recommendations will minimize the risk of a serious complication.) . Surgeries: Avoid having procedures within 2 weeks of any surgery. (Avoid for 2 weeks before or after any surgery). . Flu Shots: Avoid having procedures within 2 weeks of a flu shots or . (Avoid for 2 weeks before or after immunizations). . Barium: Avoid having a procedure within 7-10 days after having had a radiological study involving the use of radiological contrast. (Myelograms, Barium swallow or enema study). . Heart attacks: Avoid any elective procedures or surgeries for the initial 6 months after a "Myocardial Infarction" (Heart Attack). . Blood thinners: It is imperative that you stop these medications before procedures. Let us know if you if you take any blood thinner.  . Infection: Avoid procedures during or within two weeks of an infection (including chest colds or gastrointestinal problems). Symptoms associated with infections include: Localized redness, fever, chills, night sweats or profuse sweating, burning sensation when voiding, cough, congestion, stuffiness, runny nose, sore throat, diarrhea, nausea, vomiting, cold or Flu symptoms, recent or current infections. It is specially important if the infection is over the area that we intend to treat. . Heart and lung problems: Symptoms that may suggest an active cardiopulmonary problem include: cough, chest pain, breathing difficulties or shortness of breath, dizziness, ankle swelling, uncontrolled high or unusually low blood pressure, and/or palpitations. If you are experiencing any of these symptoms, cancel your procedure and contact your primary care physician for an evaluation.  Remember:  Regular   Business hours are:  Monday to Thursday 8:00 AM to 4:00  PM  Provider's Schedule: Faydra Korman, MD:  Procedure days: Tuesday and Thursday 7:30 AM to 4:00 PM  Bilal Lateef, MD:  Procedure days: Monday and Wednesday 7:30 AM to 4:00 PM ____________________________________________________________________________________________    

## 2020-10-02 NOTE — Progress Notes (Signed)
Safety precautions to be maintained throughout the outpatient stay will include: orient to surroundings, keep bed in low position, maintain call bell within reach at all times, provide assistance with transfer out of bed and ambulation.  

## 2020-10-02 NOTE — Progress Notes (Signed)
PROVIDER NOTE: Information contained herein reflects review and annotations entered in association with encounter. Interpretation of such information and data should be left to medically-trained personnel. Information provided to patient can be located elsewhere in the medical record under "Patient Instructions". Document created using STT-dictation technology, any transcriptional errors that may result from process are unintentional.    Patient: Maria Spencer  Service Category: E/M  Provider: Gaspar Cola, MD  DOB: 1946/04/11  DOS: 10/02/2020  Specialty: Interventional Pain Management  MRN: [id number]  Setting: Ambulatory outpatient  PCP: McLean-Scocuzza, Nino Glow, MD  Type: Established Patient    Referring Provider: Orland Mustard *  Location: Office  Delivery: Face-to-face     HPI  [name], a 74 y.o. year old female, is here today because of her Chronic pain syndrome [G89.4]. [name] primary complain today is Neck Pain and Shoulder Pain Last encounter: [name] was on 09/14/2020. Pertinent problems: Ms. 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); Chronic hip pain (2ry area of Pain) (Bilateral) (R>L); Mid back pain; Chronic low back pain with sciatica; Chronic thoracic back pain (1ry area of Pain) (Bilateral) (R>L); Fusion of spine of lumbosacral region; Lumbosacral spondylosis with radiculopathy; Chronic pain syndrome; Chronic lower extremity pain (3ry area of Pain) (Right); Chronic knee pain (4th area of Pain) (Posterior) (Left); Thoracic facet syndrome (Bilateral); Chronic sacroiliac joint pain (Bilateral); Spondylosis without myelopathy or radiculopathy, thoracic region; Closed wedge compression fracture of T8 vertebra ([place]); Osteoarthritis of hips (Bilateral); Abnormal  thoracolumbar CT myelogram (01/05/2020); Breast cancer ([place]); 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 Pain Assessment: Severity of Chronic pain is reported as a 5 /10. Location: Neck  /radiates into both shoudlers. Onset: More than a month ago. Quality: Constant, Discomfort. Timing: Constant. Modifying factor(s): nothing. Vitals:  height is $RemoveB'4\' 11"'HdwuslnM$  (1.499 m) and weight is 127 lb (57.6 kg). Her temperature is 97.7 F (36.5 C). Her blood pressure is 142/62 (abnormal) and her pulse is 90. Her respiration is 16 and oxygen saturation is 99%.   Reason for encounter: post-procedure assessment.  Today the patient returns and with her she is brought back the official reports of her cervical MRI, hip MRI, and thoracic CT.  Since I already had seen those and have access to those reports, I do do opportunity to take copies that she brought in and I went over each one of them going over the findings I explained them to the patient one by one.  Initially she had indicated that she had 100% relief of the pain for the duration of the local anesthetic after her right cervical epidural and then she had indicated no long-term benefit.  However when I went into evaluating the results in more detail, it turns out that she has recurrence of the axial pain, which I do not expect for the cervical epidural to completely eliminate that since the treatment was intraspinal and therefore what I was  looking for  is for the patient to get some benefit in terms of the extremity pain.  As it turns out, the right-sided cervical epidural steroid injection has provided her with 100% relief of her right upper extremity symptoms which have not returned.  This would mean that the cervical epidural was successful in eliminating her radiculitis/radiculopathy on the right arm.  However, she does still have pain that is axial but this is secondary to her facet disease.  This was illustrated to the patient when we went over the results of her cervical MRI.  She is still experiencing bilateral shoulder pain with the right being worse than the left and I believe that this is coming from the facet joints.  She is also reporting having had a right shoulder replacement and a left rotator cuff surgery.  She indicates that between the shoulder and neck pain, the shoulder pain is worse and therefore I will go ahead and schedule her to return for a diagnostic bilateral suprascapular nerve block to determine if this pain is peripheral or is more central and perhaps coming from the facet joints or the L4/L5 nerve roots.  Depending on the results of her diagnostic bilateral suprascapular nerve block, we may move later on to a diagnostic bilateral cervical facet block.  If the diagnostic injection helps with the axial neck pain and the shooting pains that she is describing is going to the top of her head, then will proceed with radiofrequency for the purpose of providing the patient with longer lasting benefit.  Post-Procedure Evaluation  Procedure (09/14/2020): Diagnostic right cervical ESI #1 under fluoroscopic guidance, no sedation Pre-procedure pain level: 4/10 Post-procedure: 0/10 (100% relief)  Sedation: None.  Effectiveness during initial hour after procedure(Ultra-Short Term Relief): 100 %.  Local anesthetic used: Long-acting (4-6 hours) Effectiveness: Defined as any analgesic benefit obtained secondary to the  administration of local anesthetics. This carries significant diagnostic value as to the etiological location, or anatomical origin, of the pain. Duration of benefit is expected to coincide with the duration of the local anesthetic used.  Effectiveness during initial 4-6 hours after procedure(Short-Term Relief): 100 %.  Long-term benefit: Defined as any relief past the pharmacologic duration of the local anesthetics.  Effectiveness past the initial 6 hours after procedure(Long-Term Relief): 100 %.  Complete relief of the right upper extremity pain that seems to still be ongoing.  She did have return of the neck pain and shoulder pain.  Current benefits: Defined as benefit that persist at this time.   Analgesia:  90-100% better in terms of the right upper extremity pain.  However the cervicalgia has returned. Function: Ms. Walsworth reports improvement in function ROM: Ms. Criado reports improvement in ROM  Pharmacotherapy Assessment   Analgesic: No opioid analgesics prescribed by our practice. Highest recorded MME/day: 75 mg/day MME/day: 0 mg/day   Monitoring: [place] PMP: PDMP reviewed during this encounter.       Pharmacotherapy: No side-effects or adverse reactions reported. Compliance: No problems identified. Effectiveness: Clinically acceptable.  Dewayne Shorter, RN  10/02/2020  9:20 AM  Signed Safety precautions to be maintained throughout the outpatient stay will include: orient to surroundings, keep bed in low position, maintain call bell within reach at all times, provide assistance with transfer out of bed and ambulation.    UDS: No results found for: SUMMARY   ROS  Constitutional: Denies any fever or chills Gastrointestinal: No reported hemesis, hematochezia, vomiting, or acute GI distress Musculoskeletal: Denies any acute onset joint swelling, redness, loss of  ROM, or weakness Neurological: No reported episodes of acute onset apraxia, aphasia, dysarthria, agnosia, amnesia, paralysis,  loss of coordination, or loss of consciousness  Medication Review  Cholecalciferol, Multi-Vitamins, Teriparatide (Recombinant), acetaminophen, atorvastatin, ergocalciferol, fluticasone, gabapentin, levothyroxine, lisinopril, metoprolol succinate, nitroGLYCERIN, and zolpidem  History Review  Allergy: Ms. Munce is allergic to sulfa antibiotics, nitrofuran derivatives, amlodipine besylate, and nitrofurantoin. Drug: Ms. Sabina  reports no history of drug use. Alcohol:  reports previous alcohol use of about 7.0 standard drinks of alcohol per week. Tobacco:  reports that she has never smoked. She has never used smokeless tobacco. Social: Ms. Laidlaw  reports that she has never smoked. She has never used smokeless tobacco. She reports previous alcohol use of about 7.0 standard drinks of alcohol per week. She reports that she does not use drugs. Medical:  has a past medical history of Arthritis, Breast cancer (HCC) (2009), Chicken pox, Chronic UTI, Diverticular disease, Esophagitis, Hormone disorder, Hyperlipidemia, Hypertension, Personal history of radiation therapy (2009), and Thyroid disease. Surgical: Ms. Meline  has a past surgical history that includes Total shoulder replacement; Shoulder surgery; Tonsillectomy; Bone graft hip iliac crest; Spine surgery (10/23/2017); Ventral hernia repair (N/A, 05/29/2018); Insertion of mesh (N/A, 05/29/2018); Eye surgery; Cardiac catheterization; Breast lumpectomy (Right, 2009); Breast biopsy (Left, 2009); Breast biopsy (Right, 2009); Colonoscopy with propofol (N/A, 05/15/2020); and Spinal fusion. Family: family history includes AAA (abdominal aortic aneurysm) in her father; Arthritis in her father, mother, sister, and sister; Cancer in her brother and father; Coronary artery disease (age of onset: 63) in her mother; Diabetes in her father; Drug abuse in her daughter; Heart disease (age of onset: 7) in her mother; Hyperlipidemia in her mother, sister, and sister; Hypertension in  her mother and sister; Mental retardation in her brother.  Laboratory Chemistry Profile   Renal Lab Results  Component Value Date   BUN 24 (H) 05/08/2020   CREATININE 0.54 05/08/2020   BCR 28 (H) 09/10/2019   GFR 63.81 09/22/2018   GFRAA >60 05/08/2020   GFRNONAA >60 05/08/2020     Hepatic Lab Results  Component Value Date   AST 19 05/08/2020   ALT 15 05/08/2020   ALBUMIN 4.4 05/08/2020   ALKPHOS 97 05/08/2020   LIPASE 23 05/28/2018     Electrolytes Lab Results  Component Value Date   NA 138 05/08/2020   K 4.0 05/08/2020   CL 102 05/08/2020   CALCIUM 9.5 05/08/2020   MG 2.0 05/08/2020     Bone Lab Results  Component Value Date   VD25OH 19.57 (L) 05/08/2020     Inflammation (CRP: Acute Phase) (ESR: Chronic Phase) Lab Results  Component Value Date   CRP 0.6 05/08/2020   ESRSEDRATE 5 05/08/2020       Note: Above Lab results reviewed.  Recent Imaging Review  DG Shoulder Left CLINICAL DATA:  Chronic pain in both shoulders. No known injury.  EXAM: LEFT SHOULDER - 2+ VIEW  COMPARISON:  None.  FINDINGS: Tacks in the humeral head from presumed rotator cuff repair. There is mild acromioclavicular spurring with inferior osteophytes and small subacromial spur. Mild glenoid spurring with preservation of joint space. No erosion, evidence of focal bone lesion or fracture. Soft tissues are unremarkable.  IMPRESSION: 1. Mild degenerative acromioclavicular and glenohumeral osteoarthritis. Small subacromial spur. 2. Tacks in the humeral head from presumed rotator cuff repair.  Electronically Signed   By: Narda Rutherford M.D.   On: 10/02/2020 18:21 DG Shoulder Right CLINICAL DATA:  Chronic pain in both shoulders. No  known injury.  EXAM: RIGHT SHOULDER - 2+ VIEW  COMPARISON:  None.  FINDINGS: Reverse shoulder arthroplasty. No periprosthetic lucency or fracture. The humeral stem is midline. Widening of the acromioclavicular joint is presumably  postsurgical. There is no evidence of focal bone lesion or bony destruction. Multiple surgical clips in the axilla.  IMPRESSION: 1. 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 2. Widening of the acromioclavicular joint is presumably postsurgical.  Electronically Signed   By: Keith Rake M.D.   On: 10/02/2020 18:20 Note: Reviewed        Physical Exam  General appearance: Well nourished, well developed, and well hydrated. In no apparent acute distress Mental status: Alert, oriented x 3 (person, place, & time)       Respiratory: No evidence of acute respiratory distress Eyes: PERLA Vitals: BP (!) 142/62   Pulse 90   Temp 97.7 F (36.5 C)   Resp 16   Ht $R'4\' 11"'Md$  (1.499 m)   Wt 127 lb (57.6 kg)   SpO2 99%   BMI 25.65 kg/m  BMI: Estimated body mass index is 25.65 kg/m as calculated from the following:   Height as of this encounter: $RemoveBeforeD'4\' 11"'[id number]$  (1.499 m).   Weight as of this encounter: 127 lb (57.6 kg). Ideal: Patient must be at least 60 in tall to calculate ideal body weight  Assessment   Status Diagnosis  Controlled Controlled Controlled 1. Chronic pain syndrome   2. Cervicalgia   3. Cervical radiculopathy at C7 (Right)   4. 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 Chronic hip pain (2ry area of Pain) (Bilateral) (R>L)   6. Chronic lower extremity pain (3ry area of Pain) (Right)   7. Chronic knee pain (4th area of Pain) (Posterior) (Left)   8. Chronic shoulder pain (Bilateral) (R>L)   9. Pain in shoulder region after shoulder replacement (Right)      Updated Problems: Problem  Chronic shoulder pain (Bilateral) (R>L)  Pain in shoulder region after shoulder replacement (Right)    Plan of Care  Problem-specific:  No problem-specific Assessment & Plan notes found for this encounter.  Ms. Jenel Gierke has a current medication list which includes the following long-term medication(s): atorvastatin, fluticasone,  levothyroxine, lisinopril, metoprolol succinate, and zolpidem.  Pharmacotherapy (Medications Ordered): No orders of the defined types were placed in this encounter.  Orders:  Orders Placed This Encounter  Procedures  . SUPRASCAPULAR NERVE BLOCK    Standing Status:   Future    Standing Expiration Date:   12/02/2020    Order Specific Question:   Where will this procedure be performed?    Answer:   [facility]  . DG Shoulder Right    Standing Status:   Future    Number of Occurrences:   1    Standing Expiration Date:   11/01/2020    Scheduling Instructions:     Imaging must be done as soon as possible. Inform patient that order will expire within 30 days and I will not renew it.    Order Specific Question:   Reason for Exam (SYMPTOM  OR DIAGNOSIS REQUIRED)    Answer:   Right shoulder pain    Order Specific Question:   Preferred imaging location?    Answer:   [facility]    Order Specific Question:   Call Results- Best Contact Number?    Answer:   [phone] ([facility])    Order Specific Question:   Release to  patient    Answer:   Immediate  . DG Shoulder Left    Standing Status:   Future    Number of Occurrences:   1    Standing Expiration Date:   11/01/2020    Scheduling Instructions:     Imaging must be done as soon as possible. Inform patient that order will expire within 30 days and I will not renew it.    Order Specific Question:   Reason for Exam (SYMPTOM  OR DIAGNOSIS REQUIRED)    Answer:   Left shoulder pain    Order Specific Question:   Preferred imaging location?    Answer:   [facility]    Order Specific Question:   Call Results- Best Contact Number?    Answer:   [phone] ([facility])    Order Specific Question:   Release to patient    Answer:   Immediate   Follow-up plan:   Return for Procedure (no sedation): (B) SSNB #1.      Interventional treatment options: Planned, scheduled, and/or pending:   Diagnostic right T9-10  thoracic ESI #1    Under consideration:   Diagnostic right T9-10 thoracic ESI #1  Diagnostic bilateral T9, T10, T11, and T12 MMB thoracic facet RFA (DIFICULT CANDIDATE DUE TO HARDWARE)    Therapeutic/palliative (PRN):   Diagnostic bilateral T9, T10, T11, and T12 MMB thoracic facet block #3       Recent Visits Date Type Provider Dept  09/14/20 Procedure visit Milinda Pointer, MD [facility]  09/13/20 Office Visit Milinda Pointer, MD [facility]  Showing recent visits within past 90 days and meeting all other requirements Today's Visits Date Type Provider Dept  10/02/20 Office Visit Milinda Pointer, MD [facility]  Showing today's visits and meeting all other requirements Future Appointments Date Type Provider Dept  10/05/20 Appointment Milinda Pointer, MD [facility]  Showing future appointments within next 90 days and meeting all other requirements  I discussed the assessment and treatment plan with the patient. The patient was provided an opportunity to ask questions and all were answered. The patient agreed with the plan and demonstrated an understanding of the instructions.  Patient advised to call back or seek an in-person evaluation if the symptoms or condition worsens.  Duration of encounter: 48 minutes.  Note by: Gaspar Cola, MD Date: 10/02/2020; Time: 7:05 PM

## 2020-10-04 NOTE — Progress Notes (Addendum)
PROVIDER NOTE: Information contained herein reflects review and annotations entered in association with encounter. Interpretation of such information and data should be left to medically-trained personnel. Information provided to patient can be located elsewhere in the medical record under "Patient Instructions". Document created using STT-dictation technology, any transcriptional errors that may result from process are unintentional.    Patient: Maria Spencer  Service Category: Procedure  Provider: Gaspar Cola, Spencer  DOB: 02/25/1946  DOS: 10/05/2020  Location: [facility]  MRN: [id number]  Setting: Ambulatory - outpatient  Referring Provider: McLean-Scocuzza, Olivia Spencer *  Type: Established Patient  Specialty: Interventional Pain Management  PCP: McLean-Scocuzza, Maria Spencer   Primary Reason for Visit: Interventional Pain Management Treatment. CC: Shoulder Pain  Procedure:          Anesthesia, Analgesia, Anxiolysis:  Type: Diagnostic Suprascapular nerve Block #1  Primary Purpose: Diagnostic Region: Posterior Shoulder & Scapular Areas Level: Superior to the scapular spine, in the lateral aspect of the supraspinatus fossa (Suprescapular notch). Target Area: Suprascapular nerve as it passes thru the lower portion of the suprascapular notch. Approach: Posterior percutaneous approach. Laterality: Bilateral  Type: Moderate (Conscious) Sedation combined with Local Anesthesia Indication(s): Analgesia and Anxiety Route: Intravenous (IV) IV Access: Secured Sedation: Meaningful verbal contact was maintained at all times during the procedure  Local Anesthetic: Lidocaine 1-2%  Position: Prone   Indications: 1. 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 Osteoarthritis of AC (acromioclavicular) joints (Bilateral)   3. 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 Chronic Acromioclavicular (AC) joint pain (Bilateral)   5. Osteoarthritis of glenohumeral joint  (Left)    Pain Score: Pre-procedure: 7 /10 Post-procedure: 3 /10   Pre-op H&P:  Maria Spencer is a 74 y.o. (year old), female patient, seen today for interventional treatment. She  has a past surgical history that includes Total shoulder replacement; Shoulder surgery; Tonsillectomy; Bone graft hip iliac crest; Spine surgery (10/23/2017); Ventral hernia repair (N/A, 05/29/2018); Insertion of mesh (N/A, 05/29/2018); Eye surgery; Cardiac catheterization; Breast lumpectomy (Right, 2009); Breast biopsy (Left, 2009); Breast biopsy (Right, 2009); Colonoscopy with propofol (N/A, 05/15/2020); and Spinal fusion. Maria Spencer has a current medication list which includes the following prescription(s): acetaminophen, atorvastatin, cholecalciferol, ergocalciferol, fluticasone, forteo, gabapentin, levothyroxine, lisinopril, metoprolol succinate, multi-vitamins, nitroglycerin, and zolpidem, and the following Facility-Administered Medications: fentanyl, lactated ringers, and midazolam. Her primarily concern today is the Shoulder Pain  Initial Vital Signs:  Pulse/HCG Rate: 84ECG Heart Rate: 74 Temp: (!) 97.1 F (36.2 C) Resp: 11 BP: (!) 164/88 SpO2: 100 %  BMI: Estimated body mass index is 17.71 kg/m as calculated from the following:   Height as of this encounter: 5\' 11"  (1.803 m).   Weight as of this encounter: 127 lb (57.6 kg).  Risk Assessment: Allergies: Reviewed. She is allergic to sulfa antibiotics, nitrofuran derivatives, amlodipine besylate, and nitrofurantoin.  Allergy Precautions: None required Coagulopathies: Reviewed. None identified.  Blood-thinner therapy: None at this time Active Infection(s): Reviewed. None identified. Maria Spencer is afebrile  Site Confirmation: Maria Spencer was asked to confirm the procedure and laterality before marking the site Procedure checklist: Completed Consent: Before the procedure and under the influence of no sedative(s), amnesic(s), or anxiolytics, the patient was informed of  the treatment options, risks and possible complications. To fulfill our ethical and legal obligations, as recommended by the American Medical Association's Code of Ethics, I have informed the patient of my clinical impression; the nature and purpose of the treatment or procedure; the risks, benefits,  and possible complications of the intervention; the alternatives, including doing nothing; the risk(s) and benefit(s) of the alternative treatment(s) or procedure(s); and the risk(s) and benefit(s) of doing nothing. The patient was provided information about the general risks and possible complications associated with the procedure. These may include, but are not limited to: failure to achieve desired goals, infection, bleeding, organ or nerve damage, allergic reactions, paralysis, and death. In addition, the patient was informed of those risks and complications associated to the procedure, such as failure to decrease pain; infection; bleeding; organ or nerve damage with subsequent damage to sensory, motor, and/or autonomic systems, resulting in permanent pain, numbness, and/or weakness of one or several areas of the body; allergic reactions; (i.e.: anaphylactic reaction); and/or death. Furthermore, the patient was informed of those risks and complications associated with the medications. These include, but are not limited to: allergic reactions (i.e.: anaphylactic or anaphylactoid reaction(s)); adrenal axis suppression; blood sugar elevation that in diabetics may result in ketoacidosis or comma; water retention that in patients with history of congestive heart failure may result in shortness of breath, pulmonary edema, and decompensation with resultant heart failure; weight gain; swelling or edema; medication-induced neural toxicity; particulate matter embolism and blood vessel occlusion with resultant organ, and/or nervous system infarction; and/or aseptic necrosis of one or more joints. Finally, the patient was  informed that Medicine is not an exact science; therefore, there is also the possibility of unforeseen or unpredictable risks and/or possible complications that may result in a catastrophic outcome. The patient indicated having understood very clearly. We have given the patient no guarantees and we have made no promises. Enough time was given to the patient to ask questions, all of which were answered to the patient's satisfaction. Ms. Ha has indicated that she wanted to continue with the procedure. Attestation: I, the ordering provider, attest that I have discussed with the patient the benefits, risks, side-effects, alternatives, likelihood of achieving goals, and potential problems during recovery for the procedure that I have provided informed consent. Date  Time: 10/05/2020 10:36 AM  Pre-Procedure Preparation:  Monitoring: As per clinic protocol. Respiration, ETCO2, SpO2, BP, heart rate and rhythm monitor placed and checked for adequate function Safety Precautions: Patient was assessed for positional comfort and pressure points before starting the procedure. Time-out: I initiated and conducted the "Time-out" before starting the procedure, as per protocol. The patient was asked to participate by confirming the accuracy of the "Time Out" information. Verification of the correct person, site, and procedure were performed and confirmed by me, the nursing staff, and the patient. "Time-out" conducted as per Joint Commission's Universal Protocol (UP.01.01.01). Time: 1208  Description of Procedure:          Area Prepped: Entire shoulder Area DuraPrep (Iodine Povacrylex [0.7% available iodine] and Isopropyl Alcohol, 74% w/w) Safety Precautions: Aspiration looking for blood return was conducted prior to all injections. At no point did we inject any substances, as a needle was being advanced. No attempts were made at seeking any paresthesias. Safe injection practices and needle disposal techniques used.  Medications properly checked for expiration dates. SDV (single dose vial) medications used. Description of the Procedure: Protocol guidelines were followed. The patient was placed in position over the procedure table. The target area was identified and the area prepped in the usual manner. Skin & deeper tissues infiltrated with local anesthetic. Appropriate amount of time allowed to pass for local anesthetics to take effect. The procedure needles were then advanced to the target area. Proper needle  placement secured. Negative aspiration confirmed. Solution injected in intermittent fashion, asking for systemic symptoms every 0.5cc of injectate. The needles were then removed and the area cleansed, making sure to leave some of the prepping solution back to take advantage of its long term bactericidal properties.  Vitals:   10/05/20 1034 10/05/20 1208 10/05/20 1213 10/05/20 1215  BP: (!) 164/88 (!) 161/95 (!) 175/107 (!) 168/96  Pulse: 84     Resp:  11 14 15   Temp: (!) 97.1 F (36.2 C)     SpO2: 100% 99% 100% 96%  Weight: 127 lb (57.6 kg)     Height: 5\' 11"  (1.803 m)       Start Time: 1208 hrs. End Time: 1215 hrs. Materials:  Needle(s) Type: Spinal Needle Gauge: 22G Length: 3.5-in Medication(s): Please see orders for medications and dosing details.  Imaging Guidance (Non-Spinal):          Type of Imaging Technique: Fluoroscopy Guidance (Non-Spinal) Indication(s): Assistance in needle guidance and placement for procedures requiring needle placement in or near specific anatomical locations not easily accessible without such assistance. Exposure Time: Please see nurses notes. Contrast: Before injecting any contrast, we confirmed that the patient did not have an allergy to iodine, shellfish, or radiological contrast. Once satisfactory needle placement was completed at the desired level, radiological contrast was injected. Contrast injected under live fluoroscopy. No contrast complications. See  chart for type and volume of contrast used. Fluoroscopic Guidance: I was personally present during the use of fluoroscopy. "Tunnel Vision Technique" used to obtain the best possible view of the target area. Parallax error corrected before commencing the procedure. "Direction-depth-direction" technique used to introduce the needle under continuous pulsed fluoroscopy. Once target was reached, antero-posterior, oblique, and lateral fluoroscopic projection used confirm needle placement in all planes. Images permanently stored in EMR. Interpretation: I personally interpreted the imaging intraoperatively. Adequate needle placement confirmed in multiple planes. Appropriate spread of contrast into desired area was observed. No evidence of afferent or efferent intravascular uptake. Permanent images saved into the patient's record.  Antibiotic Prophylaxis:   Anti-infectives (From admission, onward)   None     Indication(s): None identified  Post-operative Assessment:  Post-procedure Vital Signs:  Pulse/HCG Rate: 8479 Temp: (!) 97.1 F (36.2 C) Resp: 15 BP: (!) 168/96 SpO2: 96 %  EBL: None  Complications: No immediate post-treatment complications observed by team, or reported by patient.  Note: The patient tolerated the entire procedure well. A repeat set of vitals were taken after the procedure and the patient was kept under observation following institutional policy, for this type of procedure. Post-procedural neurological assessment was performed, showing return to baseline, prior to discharge. The patient was provided with post-procedure discharge instructions, including a section on how to identify potential problems. Should any problems arise concerning this procedure, the patient was given instructions to immediately contact us, at any time, without hesitation. In any case, we plan to contact the patient by telephone for a follow-up status report regarding this interventional  procedure.  Comments:  No additional relevant information.  Plan of Care  Orders:  Orders Placed This Encounter  Procedures  . SUPRASCAPULAR NERVE BLOCK    Order Specific Question:   Where will this procedure be performed?    Answer:   [facility]  . DG [facility] C-ARM 1-60 MIN NO REPORT    Intraoperative interpretation by procedural physician at [place].    Standing Status:   Standing    Number of Occurrences:  1    Order Specific Question:   Reason for exam:    Answer:   Assistance in needle guidance and placement for procedures requiring needle placement in or near specific anatomical locations not easily accessible without such assistance.  . Informed Consent Details: Physician/Practitioner Attestation; Transcribe to consent form and obtain patient signature    Note: Always confirm laterality of pain with Ms. Mcquerry, before procedure.    Order Specific Question:   Physician/Practitioner attestation of informed consent for procedure/surgical case    Answer:   I, the physician/practitioner, attest that I have discussed with the patient the benefits, risks, side effects, alternatives, likelihood of achieving goals and potential problems during recovery for the procedure that I have provided informed consent.    Order Specific Question:   Procedure    Answer:   Suprascapular Nerve Block    Order Specific Question:   Physician/Practitioner performing the procedure    Answer:   Bay Jarquin A. Dossie Arbour, Spencer    Order Specific Question:   Indication/Reason    Answer:   Chronic shoulder pain (arthralgia)  . Provide equipment / supplies at bedside    "Block Tray" (Disposable  single use) Needle type: SpinalSpinal Amount/quantity: 2 Size: Regular (3-3.5-inch) Gauge: 22G    Standing Status:   Standing    Number of Occurrences:   1    Order Specific Question:   Specify    Answer:   Block Tray   Chronic Opioid Analgesic:  No opioid analgesics prescribed by our  practice. Highest recorded MME/day: 75 mg/day MME/day: 0 mg/day   Medications ordered for procedure: Meds ordered this encounter  Medications  . lidocaine (XYLOCAINE) 2 % (with pres) injection 400 mg  . lactated ringers infusion 1,000 mL  . midazolam (VERSED) 5 MG/5ML injection 1-2 mg    Make sure Flumazenil is available in the pyxis when using this medication. If oversedation occurs, administer 0.2 mg IV over 15 sec. If after 45 sec no response, administer 0.2 mg again over 1 min; may repeat at 1 min intervals; not to exceed 4 doses (1 mg)  . fentaNYL (SUBLIMAZE) injection 25-50 mcg    Make sure Narcan is available in the pyxis when using this medication. In the event of respiratory depression (RR< 8/min): Titrate NARCAN (naloxone) in increments of 0.1 to 0.2 mg IV at 2-3 minute intervals, until desired degree of reversal.  . methylPREDNISolone acetate (DEPO-MEDROL) injection 80 mg  . ropivacaine (PF) 2 mg/mL (0.2%) (NAROPIN) injection 9 mL   Medications administered: We administered lidocaine, methylPREDNISolone acetate, and ropivacaine (PF) 2 mg/mL (0.2%).  See the medical record for exact dosing, route, and time of administration.  Follow-up plan:   Return in about 2 weeks (around 10/19/2020) for (F2F), (PP) Follow-up.       Interventional treatment options: Planned, scheduled, and/or pending:   Diagnostic right T9-10 thoracic ESI #1    Under consideration:   Diagnostic right T9-10 thoracic ESI #1  Diagnostic bilateral T9, T10, T11, and T12 MMB thoracic facet RFA (DIFICULT CANDIDATE DUE TO HARDWARE)    Therapeutic/palliative (PRN):   Diagnostic bilateral T9, T10, T11, and T12 MMB thoracic facet block #3        Recent Visits Date Type Provider Dept  10/02/20 Office Visit Milinda Pointer, [facility]  09/14/20 Procedure visit Milinda Pointer, [facility]  09/13/20 Office Visit Milinda Pointer, [facility]  Showing recent visits  within past 90 days and meeting all other requirements Today's  Visits Date Type Provider Dept  10/05/20 Procedure visit Milinda Pointer, [facility]  Showing today's visits and meeting all other requirements Future Appointments Date Type Provider Dept  10/23/20 Appointment Milinda Pointer, [facility]  Showing future appointments within next 90 days and meeting all other requirements  Disposition: Discharge home  Discharge (Date  Time): 10/05/2020; 1220 hrs.   Primary Care Physician: McLean-Scocuzza, Maria Spencer Location: [facility] Note by: Gaspar Cola, Spencer Date: 10/05/2020; Time: 2:45 PM  Disclaimer:  Medicine is not an Chief Strategy Officer. The only guarantee in medicine is that nothing is guaranteed. It is important to note that the decision to proceed with this intervention was based on the information collected from the patient. The Data and conclusions were drawn from the patient's questionnaire, the interview, and the physical examination. Because the information was provided in large part by the patient, it cannot be guaranteed that it has not been purposely or unconsciously manipulated. Every effort has been made to obtain as much relevant data as possible for this evaluation. It is important to note that the conclusions that lead to this procedure are derived in large part from the available data. Always take into account that the treatment will also be dependent on availability of resources and existing treatment guidelines, considered by other Pain Management Practitioners as being common knowledge and practice, at the time of the intervention. For Medico-Legal purposes, it is also important to point out that variation in procedural techniques and pharmacological choices are the acceptable norm. The indications, contraindications, technique, and results of the above procedure should only be interpreted and judged by a  Board-Certified Interventional Pain Specialist with extensive familiarity and expertise in the same exact procedure and technique.

## 2020-10-05 ENCOUNTER — Encounter: Payer: Self-pay | Admitting: Pain Medicine

## 2020-10-05 ENCOUNTER — Ambulatory Visit (HOSPITAL_BASED_OUTPATIENT_CLINIC_OR_DEPARTMENT_OTHER): Payer: Medicare Other | Admitting: Pain Medicine

## 2020-10-05 ENCOUNTER — Other Ambulatory Visit: Payer: Self-pay

## 2020-10-05 ENCOUNTER — Ambulatory Visit
Admission: RE | Admit: 2020-10-05 | Discharge: 2020-10-05 | Disposition: A | Discharge status: Home / Self Care | Payer: Medicare Other | Source: Ambulatory Visit | Attending: Pain Medicine | Admitting: Pain Medicine

## 2020-10-05 VITALS — BP 168/96 | HR 84 | Temp 97.1°F | Resp 15 | Ht 71.0 in | Wt 127.0 lb

## 2020-10-05 DIAGNOSIS — Z96619 Presence of unspecified artificial shoulder joint: Secondary | ICD-10-CM

## 2020-10-05 DIAGNOSIS — M19011 Primary osteoarthritis, right shoulder: Secondary | ICD-10-CM | POA: Insufficient documentation

## 2020-10-05 DIAGNOSIS — M25519 Pain in unspecified shoulder: Secondary | ICD-10-CM | POA: Insufficient documentation

## 2020-10-05 DIAGNOSIS — G8929 Other chronic pain: Secondary | ICD-10-CM | POA: Diagnosis not present

## 2020-10-05 DIAGNOSIS — M25512 Pain in left shoulder: Secondary | ICD-10-CM

## 2020-10-05 DIAGNOSIS — M25511 Pain in right shoulder: Secondary | ICD-10-CM

## 2020-10-05 DIAGNOSIS — M19012 Primary osteoarthritis, left shoulder: Secondary | ICD-10-CM | POA: Diagnosis not present

## 2020-10-05 MED ORDER — ROPIVACAINE HCL 2 MG/ML IJ SOLN
9.0000 mL | Freq: Once | INTRAMUSCULAR | Status: AC
  Administered 2020-10-05: 10 mL via PERINEURAL

## 2020-10-05 MED ORDER — METHYLPREDNISOLONE ACETATE 80 MG/ML IJ SUSP
INTRAMUSCULAR | Status: AC
  Filled 2020-10-05: qty 1

## 2020-10-05 MED ORDER — LIDOCAINE HCL 2 % IJ SOLN
20.0000 mL | Freq: Once | INTRAMUSCULAR | Status: AC
  Administered 2020-10-05: 400 mg

## 2020-10-05 MED ORDER — LIDOCAINE HCL 2 % IJ SOLN
INTRAMUSCULAR | Status: AC
  Filled 2020-10-05: qty 20

## 2020-10-05 MED ORDER — MIDAZOLAM HCL 5 MG/5ML IJ SOLN: 1.0000 mg | INTRAMUSCULAR | Status: DC | PRN

## 2020-10-05 MED ORDER — LACTATED RINGERS IV SOLN: 1000.0000 mL | Freq: Once | INTRAVENOUS | Status: DC

## 2020-10-05 MED ORDER — ROPIVACAINE HCL 2 MG/ML IJ SOLN
INTRAMUSCULAR | Status: AC
  Filled 2020-10-05: qty 10

## 2020-10-05 MED ORDER — FENTANYL CITRATE (PF) 100 MCG/2ML IJ SOLN: 25.0000 ug | INTRAMUSCULAR | Status: DC | PRN

## 2020-10-05 MED ORDER — METHYLPREDNISOLONE ACETATE 80 MG/ML IJ SUSP
80.0000 mg | Freq: Once | INTRAMUSCULAR | Status: AC
  Administered 2020-10-05: 80 mg

## 2020-10-05 NOTE — Patient Instructions (Signed)

## 2020-10-05 NOTE — Progress Notes (Signed)
Safety precautions to be maintained throughout the outpatient stay will include: orient to surroundings, keep bed in low position, maintain call bell within reach at all times, provide assistance with transfer out of bed and ambulation.  

## 2020-10-06 ENCOUNTER — Telehealth: Payer: Self-pay

## 2020-10-06 DIAGNOSIS — S76011A Strain of muscle, fascia and tendon of right hip, initial encounter: Secondary | ICD-10-CM | POA: Diagnosis present

## 2020-10-06 NOTE — Telephone Encounter (Signed)
Post procedure phone call.  Patient states she is doing ok but that the shot didn't really work,.

## 2020-10-09 ENCOUNTER — Other Ambulatory Visit: Payer: Self-pay

## 2020-10-09 ENCOUNTER — Ambulatory Visit (INDEPENDENT_AMBULATORY_CARE_PROVIDER_SITE_OTHER): Payer: Medicare Other | Admitting: Urology

## 2020-10-09 VITALS — BP 186/95 | HR 88

## 2020-10-09 DIAGNOSIS — N3020 Other chronic cystitis without hematuria: Secondary | ICD-10-CM | POA: Diagnosis not present

## 2020-10-09 DIAGNOSIS — R300 Dysuria: Secondary | ICD-10-CM

## 2020-10-09 NOTE — Progress Notes (Signed)
Pt BP elevated have her schedule appt with me in person or cardiology in person to try to get on meds to reduce this to goal <130/<80  Multiple office visit outside BP elevated   Thanks

## 2020-10-09 NOTE — Progress Notes (Signed)
10/09/2020 9:07 AM   Maria Spencer Feb 24, 1946 [id number]  Referring provider: McLean-Scocuzza, Nino Glow, MD [place],  [place] [address]  Chief Complaint  Patient presents with  . Dysuria    HPI: The patient was consulted to [place] for recurrent urinary tract infections. I went through some of her medical records. She has had positive cultures resistant to antibiotics. She had been placed on trimethoprim for 90 days and had a sling 25 years ago. She had a negative CAT scan in May 2016.   The patient describes pressure and bloating and small volume frequency that generally respond to antibiotics. She can get an infection every several weeks. She was on Macrodantin daily for 30 days. 10 days after stopping it she once again had another infection  At baseline she voids every 2 hours and is continent. She gets up 3-4 times a night   The patient has little to no symptoms and possibly some mild fullness from her cystocele.   Patient describes 6 or 7 bladder infections in the last year. She will get difficulty voiding and burning and heaviness that respond usually to antibiotics. She has finished an antibiotic and she is not for certain if it is totally cleared. She describes breakthrough infections on the daily Macrodantin so was asked to stop it due to breakthrough resistant organisms  I reviewed medical records and she has had multiple positive cultures.   She has a severe reaction to sulfa drug and I think is best not to try trimethoprim which may have worked in the past but she was not necessarily taking it daily. And now looks like she might be allergic to Macrodantin when I reviewed her allergies. She understands her relative treatment options and we talked about daily Keflex. I also mentioned local estrogen cream probiotics and cranberry  Reassess in 8 weeks on daily Keflex. Call if urine culture is positive.She might be interested in taking  estrogen cream but will speak to her breast cancer provider before every doing so  Clinically was infection free on Keflex.  She developed thrush and treated herself with diet changes and mouthwash.  Last night she thought she was getting infected and went back on the daily Keflex and feels good today.  If she is able to give a sample we will send it for culture.  Otherwise I will check on her in 4 months.  Asymptomatic today for infection  Today Frequency stable.  Culture in May when I saw her negative.  Came in with breakthrough symptoms and a positive culture and saw a nurse practitioner a few months ago.  Antibiotic switch to doxycycline.  Patient had stopped daily Keflex because of a lot of hospital issues with her back neck and hip.  Last Thursday she started having strong smelling urine and pressure.  She put herself back on daily Keflex with d-mannose AZO and increase water intake and symptoms are improving.  Still on the daily Keflex.     PMH: Past Medical History:  Diagnosis Date  . Arthritis    neck, back, left knee;   . Breast cancer [facility]) 2009   right lumpectomy s/p radiation   . Chicken pox   . Chronic UTI    established with urology   . Diverticular disease    -osis and -itis   . Esophagitis    egd 05/17/14 see report scanned into chart  . Hormone disorder   . Hyperlipidemia   . Hypertension    controlled well  with medication;   . Personal history of radiation therapy 2009   F/U right breast cancer  . Thyroid disease    hypothyroidism     Surgical History: Past Surgical History:  Procedure Laterality Date  . BONE GRAFT HIP ILIAC CREST     + cage left hip 10/23/17   . BREAST BIOPSY Left 2009   clip,benign  . BREAST BIOPSY Right 2009   +  . BREAST LUMPECTOMY Right 2009   2009 lumpectomy   . CARDIAC CATHETERIZATION     No stents; [facility] doesn't recall facility +44yrs ago  . COLONOSCOPY WITH PROPOFOL N/A 05/15/2020   Procedure: COLONOSCOPY WITH  PROPOFOL;  Surgeon: Lin Landsman, MD;  Location: [facility] ENDOSCOPY;  Service: Gastroenterology;  Laterality: N/A;  . EYE SURGERY     b/l cataracts sch 10/2019 in [place]  . INSERTION OF MESH N/A 05/29/2018   Procedure: INSERTION OF MESH;  Surgeon: Johnathan Hausen, MD;  Location: [facility] ORS;  Service: General;  Laterality: N/A;  . SHOULDER SURGERY     x2 surgeries both shoulders, right shoulder replacement last in 2012   . SPINAL FUSION    . SPINE SURGERY  10/23/2017   spinal 09/2017 h/o scoliosis [facility] L4-S1 OLIF T10 to ililum fusion  . TONSILLECTOMY     age 74 y.o.   . TOTAL SHOULDER REPLACEMENT    . VENTRAL HERNIA REPAIR N/A 05/29/2018   Procedure: LAPAROSCOPIC VENTRAL / [place];  Surgeon: Johnathan Hausen, MD;  Location: [facility] ORS;  Service: General;  Laterality: N/A;    Home Medications:  Allergies as of 10/09/2020      Reactions   Sulfa Antibiotics Anaphylaxis, Hives, Itching, Shortness Of Breath, Swelling   Lips & throat swelled   Nitrofuran Derivatives Swelling   Lip swelling; "eyes get red and puffy"   Amlodipine Besylate Other (See Comments)   Gas, bloating  Gas, bloating    Nitrofurantoin Hives, Other (See Comments)      Medication List       Accurate as of October 09, 2020  9:07 AM. If you have any questions, ask your nurse or doctor.        STOP taking these medications   acetaminophen 500 MG tablet Commonly known as: TYLENOL Stopped by: Reece Packer, MD   nitroGLYCERIN 0.4 MG SL tablet Commonly known as: NITROSTAT Stopped by: Reece Packer, MD     TAKE these medications   atorvastatin 10 MG tablet Commonly known as: LIPITOR Take 1 tablet (10 mg total) by mouth every other day. Note 10 mg no need to cut in 1/2 as before you have 20 mg taking 1/2 pill =10 mg   cephALEXin 250 MG capsule Commonly known as: KEFLEX Take 250 mg by mouth daily.   Cholecalciferol 25 MCG (1000 UT) tablet Take by mouth.   ergocalciferol 1.25 MG (50000 UT)  capsule Commonly known as: VITAMIN D2 Take by mouth.   fluticasone 50 MCG/ACT nasal spray Commonly known as: FLONASE Place 2 sprays into both nostrils daily. Max b/l nostrils   Forteo 600 MCG/2.4ML Sopn Generic drug: Teriparatide (Recombinant)   gabapentin 100 MG capsule Commonly known as: NEURONTIN Take 200 mg by mouth 3 (three) times daily.   levothyroxine 88 MCG tablet Commonly known as: SYNTHROID Take 1 tablet (88 mcg total) by mouth daily before breakfast. 30 minutes   lisinopril 40 MG tablet Commonly known as: ZESTRIL Take 1 tablet (40 mg total) by mouth daily.   metoprolol succinate 25  MG 24 hr tablet Commonly known as: TOPROL-XL Take 1 tablet (25 mg total) by mouth daily.   Multi-Vitamins Tabs Take 1 tablet by mouth daily.   zolpidem 5 MG tablet Commonly known as: AMBIEN Take 1 tablet (5 mg total) by mouth at bedtime as needed for sleep.       Allergies:  Allergies  Allergen Reactions  . Sulfa Antibiotics Anaphylaxis, Hives, Itching, Shortness Of Breath and Swelling    Lips & throat swelled  . Nitrofuran Derivatives Swelling    Lip swelling; "eyes get red and puffy"  . Amlodipine Besylate Other (See Comments)    Gas, bloating   Gas, bloating   . Nitrofurantoin Hives and Other (See Comments)    Family History: Family History  Problem Relation Age of Onset  . Arthritis Mother   . Heart disease Mother 48  . Hyperlipidemia Mother   . Hypertension Mother   . Coronary artery disease Mother 31  . Arthritis Father   . Diabetes Father   . Cancer Father        colon  . AAA (abdominal aortic aneurysm) Father   . Arthritis Sister   . Hyperlipidemia Sister   . Hypertension Sister   . Cancer Brother        lung, smoker  . Mental retardation Brother   . Drug abuse Daughter        overdose in 2018   . Arthritis Sister   . Hyperlipidemia Sister   . Bladder Cancer Neg Hx   . Kidney cancer Neg Hx   . Breast cancer Neg Hx     Social History:  reports  that she has never smoked. She has never used smokeless tobacco. She reports previous alcohol use of about 7.0 standard drinks of alcohol per week. She reports that she does not use drugs.  ROS:                                        Physical Exam: BP (!) 186/95   Pulse 88   Constitutional:  Alert and oriented, No acute distress.  Laboratory Data: Lab Results  Component Value Date   WBC 4.8 01/05/2020   HGB 13.7 01/05/2020   HCT 43.1 01/05/2020   MCV 88.0 01/05/2020   PLT 211 01/05/2020    Lab Results  Component Value Date   CREATININE 0.54 05/08/2020    No results found for: PSA  No results found for: TESTOSTERONE  Lab Results  Component Value Date   HGBA1C 5.6 09/10/2019    Urinalysis    Component Value Date/Time   COLORURINE YELLOW 10/20/2019 1400   APPEARANCEUR Clear 07/10/2020 1328   LABSPEC 1.018 10/20/2019 1400   PHURINE 5.5 10/20/2019 1400   GLUCOSEU Negative 07/10/2020 1328   GLUCOSEU NEGATIVE 09/22/2018 1416   HGBUR TRACE (A) 10/20/2019 1400   BILIRUBINUR Negative 07/10/2020 1328   KETONESUR TRACE (A) 10/20/2019 1400   PROTEINUR Negative 07/10/2020 1328   PROTEINUR TRACE (A) 10/20/2019 1400   UROBILINOGEN 1.0 01/24/2020 1107   UROBILINOGEN 0.2 09/22/2018 1416   NITRITE Negative 07/10/2020 1328   NITRITE POSITIVE (A) 10/20/2019 1400   LEUKOCYTESUR Trace (A) 07/10/2020 1328   LEUKOCYTESUR 2+ (A) 10/20/2019 1400    Pertinent Imaging: Urine reviewed.  Chart reviewed.  Urine sent for culture  Assessment & Plan: Patient understands treatment tools are limited.  She has a breast cancer history.  Go go back and stay on daily Keflex hoping it will help some.  Call if urine culture is positive.  Treat breakthrough infections as needed  She is having trouble walking right now and needs right hip surgery to reattach the tendon.  Reassess in 6 months on daily Keflex.  Estrogen described in detail  1. Dysuria  - Urinalysis, Complete    No follow-ups on file.  Reece Packer, MD  [facility] [address] [place], [place] [address] [phone]

## 2020-10-10 LAB — URINALYSIS, COMPLETE
Bilirubin, UA: NEGATIVE
Glucose, UA: NEGATIVE
Ketones, UA: NEGATIVE
Nitrite, UA: NEGATIVE
Protein,UA: NEGATIVE
RBC, UA: NEGATIVE
Specific Gravity, UA: 1.02 (ref 1.005–1.030)
Urobilinogen, Ur: 0.2 mg/dL (ref 0.2–1.0)
pH, UA: 5.5 (ref 5.0–7.5)

## 2020-10-10 LAB — MICROSCOPIC EXAMINATION

## 2020-10-12 LAB — CULTURE, URINE COMPREHENSIVE

## 2020-10-13 ENCOUNTER — Telehealth: Payer: Self-pay

## 2020-10-13 MED ORDER — CEFDINIR 300 MG PO CAPS: 300.0000 mg | ORAL_CAPSULE | Freq: Two times a day (BID) | ORAL | 0 refills | Status: AC

## 2020-10-13 NOTE — Telephone Encounter (Signed)
Patient aware and verbalized understanding. Sent medication to pharmacy

## 2020-10-13 NOTE — Telephone Encounter (Signed)
-----   Message from Chrystie Nose, [place] sent at 10/13/2020 10:21 AM EST -----  ----- Message ----- From: Bjorn Loser, MD Sent: 10/13/2020   9:53 AM EST To: Chrystie Nose, CMA  Omnicef 300 mg bid for 7 days Then go back on daily Keflex  ----- Message ----- From: Chrystie Nose, CMA Sent: 10/12/2020   7:48 AM EST To: Bjorn Loser, MD   ----- Message ----- From: Interface, [facility] Lab Results In Sent: 10/10/2020  11:37 AM EST To: [name] Clinical

## 2020-10-22 NOTE — Progress Notes (Signed)
PROVIDER NOTE: Information contained herein reflects review and annotations entered in association with encounter. Interpretation of such information and data should be left to medically-trained personnel. Information provided to patient can be located elsewhere in the medical record under "Patient Instructions". Document created using STT-dictation technology, any transcriptional errors that may result from process are unintentional.    Patient: Maria Spencer  Service Category: E/M  Provider: Gaspar Cola, MD  DOB: [birth year]  DOS: 10/23/2020  Specialty: Interventional Pain Management  MRN: [id number]  Setting: Ambulatory outpatient  PCP: McLean-Scocuzza, Nino Glow, MD  Type: Established Patient    Referring Provider: Orland Mustard *  Location: Office  Delivery: Face-to-face     HPI  Maria Spencer, a 74 y.o. year old female, is here today because of her Chronic pain syndrome [G89.4]. Maria Spencer primary complain today is Neck Pain Last encounter: [name] was on 10/05/2020. 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/20/2017); Chronic hip pain (2ry area of Pain) (Bilateral) (R>L); Mid back pain; Chronic low back pain with sciatica; Chronic thoracic back pain (1ry area of Pain) (Bilateral) (R>L); Fusion of spine of lumbosacral region; Lumbosacral spondylosis with radiculopathy; Chronic pain syndrome; Chronic lower extremity pain (3ry area of Pain) (Right); Chronic knee pain (4th area of Pain) (Posterior) (Left); Thoracic facet syndrome (Bilateral); Chronic sacroiliac joint pain (Bilateral); Spondylosis without myelopathy or radiculopathy, thoracic region; Closed wedge compression fracture of T8 vertebra ([place]); Osteoarthritis of hips (Bilateral); Abnormal thoracolumbar CT  myelogram (01/05/2020); Breast cancer ([place]); 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); DDD (degenerative disc disease), cervical; DDD (degenerative disc disease), thoracic; DDD (degenerative disc disease), lumbar; Displacement of thoracic intervertebral disc (C6-T3, T4-5, T6-7, T8-9); Non-traumatic compression fracture of T8 thoracic vertebra, sequela; Cervicalgia; Chronic upper extremity pain (Right); Cervical spondylosis with radiculopathy (Right); Cervical radiculopathy at C7 (Right); Chronic shoulder pain (Bilateral) (R>L); Chronic shoulder pain after shoulder replacement (Right); Osteoarthritis of AC (acromioclavicular) joints (Bilateral); Chronic Acromioclavicular (AC) joint pain (Bilateral); Osteoarthritis of glenohumeral joint (Left); Abnormal MRI, cervical spine (09/26/2020); Cervical facet syndrome (Bilateral); Cervicogenic headache (Bilateral); Occipital neuralgia (greater occipital nerve) (Bilateral); Cervico-occipital neuralgia (Bilateral); and Cervical facet hypertrophy (Multilevel) (Bilateral) on their pertinent problem list. Pain Assessment: Severity of Chronic pain is reported as a 5 /10. Location: Neck Lower/pain radiaties down to her shoulders. Onset: More than a month ago. Quality: Aching, Burning, Constant. Timing: Constant. Modifying factor(s): nothing. Vitals:  height is _0  (1.499 m) and weight is 127 lb (57.6 kg). Her temperature is 97.2 F (36.2 C) (abnormal). Her blood pressure is 139/80 and her pulse is 79. Her oxygen saturation is 95%.   Reason for encounter: post-procedure assessment.  The patient returns to the clinic today indicating that the diagnostic bilateral suprascapular nerve block "did not work".  However, when we look at the patient expectations, she was expecting getting 100% relief of the pain, permanently.  If we look at it that way, then it did not work.  However, if we divide the diagnostic block into its components we see that the steroids did not provide  her with any significant long-term benefit suggesting either a mechanical compression/irritation predominating over an inflammatory process.  If we then look at the results of the local anesthetics, we have that the patient attained 100% relief of the pain in that shoulder, for  the duration of the local anesthetic.  That would suggest that there is a certain degree of involvement of the shoulder and the patient's pathology.  However, she only attained relief of the shoulder pain but no pain relief over the area between the deltoid and the cervical spine, no relief of the cervicalgia, and no relief of the cervicogenic headaches.  She also did not get any relief of the numbness that she is experiencing in her right hand.  She describes this numbness as being over the area of the thumb, index finger, and middle finger, suggesting at least a C6 and C7 radiculopathy on the right side.  The patient has an MRI of the cervical spine that confirms multilevel, facet hypertrophy suggesting the possibility of a bilateral cervical facet syndrome.  A cervical facet syndrome affecting the lower cervical region would explain the patient's neck pain and shoulder pain.  Cervical facet pain affecting the upper cervical region would also explain the patient's cervicogenic headache over the distribution of the occipital nerve, bilaterally.  That would suggest involvement of C2 and C3/TON.  The patient indicates having consulted with [name] who thinks that her shoulder pain, upper extremity, and neck problems are all associated with cervical radiculopathies.  Again, I would agree with this, but I think there is also a significant cervical facet component to this.  Because of this I have scheduled the patient for a diagnostic bilateral cervical facet block under fluoroscopic guidance and IV sedation.  I do not think that this would help the numbness in the radicular symptoms down the right upper extremity, but it may provide the  patient with relief of the pain in the neck area and the cervicogenic headaches.  If that is the case, then we will consider radiofrequency ablation.  However, I still think that there is a radicular component that we will need to address, perhaps with a right-sided cervical epidural steroid injection.  My opinion and the plan was shared with the patient who understood and accepted.  Post-Procedure Evaluation  Procedure (10/05/2020): Diagnostic bilateral suprascapular nerve block #1 under fluoroscopic guidance and IV sedation Pre-procedure pain level: 7/10 Post-procedure: 3/10 (100% relief)  Sedation: Please see nurses note.  Effectiveness during initial hour after procedure(Ultra-Short Term Relief): 100 %.  Local anesthetic used: Long-acting (4-6 hours) Effectiveness: Defined as any analgesic benefit obtained secondary to the administration of local anesthetics. This carries significant diagnostic value as to the etiological location, or anatomical origin, of the pain. Duration of benefit is expected to coincide with the duration of the local anesthetic used.  Effectiveness during initial 4-6 hours after procedure(Short-Term Relief): 100 %.  Long-term benefit: Defined as any relief past the pharmacologic duration of the local anesthetics.  Effectiveness past the initial 6 hours after procedure(Long-Term Relief): 100 % (only one day).  Current benefits: Defined as benefit that persist at this time.   Analgesia:  Back to baseline Function: Back to baseline ROM: Back to baseline  Pharmacotherapy Assessment   Analgesic: No opioid analgesics prescribed by our practice. Highest recorded MME/day: 75 mg/day MME/day: 0 mg/day   Monitoring: [place] PMP: PDMP reviewed during this encounter.       Pharmacotherapy: No side-effects or adverse reactions reported. Compliance: No problems identified. Effectiveness: Clinically acceptable.  Chauncey Fischer, RN  10/23/2020  8:21 AM  Sign when Signing  Visit Safety precautions to be maintained throughout the outpatient stay will include: orient to surroundings, keep bed in low position, maintain call bell within reach at  all times, provide assistance with transfer out of bed and ambulation.     UDS: No results found for: SUMMARY   ROS  Constitutional: Denies any fever or chills Gastrointestinal: No reported hemesis, hematochezia, vomiting, or acute GI distress Musculoskeletal: Denies any acute onset joint swelling, redness, loss of ROM, or weakness Neurological: No reported episodes of acute onset apraxia, aphasia, dysarthria, agnosia, amnesia, paralysis, loss of coordination, or loss of consciousness  Medication Review  Cholecalciferol, Multi-Vitamins, Teriparatide (Recombinant), atorvastatin, cephALEXin, ergocalciferol, fluticasone, gabapentin, levothyroxine, lisinopril, metoprolol succinate, and zolpidem  History Review  Allergy: Maria Spencer is allergic to sulfa antibiotics, nitrofuran derivatives, amlodipine besylate, and nitrofurantoin. Drug: Maria Spencer  reports no history of drug use. Alcohol:  reports previous alcohol use of about 7.0 standard drinks of alcohol per week. Tobacco:  reports that she has never smoked. She has never used smokeless tobacco. Social: Maria Spencer  reports that she has never smoked. She has never used smokeless tobacco. She reports previous alcohol use of about 7.0 standard drinks of alcohol per week. She reports that she does not use drugs. Medical:  has a past medical history of Arthritis, Breast cancer ([place]) (2009), Chicken pox, Chronic UTI, Diverticular disease, Esophagitis, Hormone disorder, Hyperlipidemia, Hypertension, Personal history of radiation therapy (2009), and Thyroid disease. Surgical: Maria Spencer  has a past surgical history that includes Total shoulder replacement; Shoulder surgery; Tonsillectomy; Bone graft hip iliac crest; Spine surgery (10/23/2017); Ventral hernia repair (N/A, 05/29/2018); Insertion  of mesh (N/A, 05/29/2018); Eye surgery; Cardiac catheterization; Breast lumpectomy (Right, 2009); Breast biopsy (Left, 2009); Breast biopsy (Right, 2009); Colonoscopy with propofol (N/A, 05/15/2020); and Spinal fusion. Family: family history includes AAA (abdominal aortic aneurysm) in her father; Arthritis in her father, mother, sister, and sister; Cancer in her brother and father; Coronary artery disease (age of onset: 91) in her mother; Diabetes in her father; Drug abuse in her daughter; Heart disease (age of onset: 72) in her mother; Hyperlipidemia in her mother, sister, and sister; Hypertension in her mother and sister; Mental retardation in her brother.  Laboratory Chemistry Profile   Renal Lab Results  Component Value Date   BUN 24 (H) 05/08/2020   CREATININE 0.54 05/08/2020   BCR 28 (H) 09/10/2019   GFR 63.81 09/22/2018   GFRAA >60 05/08/2020   GFRNONAA >60 05/08/2020     Hepatic Lab Results  Component Value Date   AST 19 05/08/2020   ALT 15 05/08/2020   ALBUMIN 4.4 05/08/2020   ALKPHOS 97 05/08/2020   LIPASE 23 05/28/2018     Electrolytes Lab Results  Component Value Date   NA 138 05/08/2020   K 4.0 05/08/2020   CL 102 05/08/2020   CALCIUM 9.5 05/08/2020   MG 2.0 05/08/2020     Bone Lab Results  Component Value Date   VD25OH 19.57 (L) 05/08/2020     Inflammation (CRP: Acute Phase) (ESR: Chronic Phase) Lab Results  Component Value Date   CRP 0.6 05/08/2020   ESRSEDRATE 5 05/08/2020       Note: Above Lab results reviewed.  Recent Imaging Review  DG PAIN CLINIC C-ARM 1-60 MIN NO REPORT Fluoro was used, but no Radiologist interpretation will be provided.  Please refer to "NOTES" tab for provider progress note. Note: Reviewed        Physical Exam  General appearance: Well nourished, well developed, and well hydrated. In no apparent acute distress Mental status: Alert, oriented x 3 (person, place, & time)       Respiratory:  No evidence of acute respiratory  distress Eyes: PERLA Vitals: BP 139/80   Pulse 79   Temp (!) 97.2 F (36.2 C)   Ht _0  (1.499 m)   Wt 127 lb (57.6 kg)   SpO2 95%   BMI 25.65 kg/m  BMI: Estimated body mass index is 25.65 kg/m as calculated from the following:   Height as of this encounter: _1  (1.499 m).   Weight as of this encounter: 127 lb (57.6 kg). Ideal: Patient must be at least 60 in tall to calculate ideal body weight  Assessment   Status Diagnosis  Persistent Unimproved Unimproved 1. Chronic pain syndrome   2. Chronic shoulder pain (Bilateral) (R>L)   3. Cervicalgia   4. Cervical facet syndrome (Bilateral)   5. Cervical facet hypertrophy (Multilevel) (Bilateral)   6. Cervicogenic headache (Bilateral)   7. Cervico-occipital neuralgia (Bilateral)   8. Occipital neuralgia (greater occipital nerve) (Bilateral)   9. Chronic shoulder pain after shoulder replacement (Right)   10. Cervical spondylosis with radiculopathy (Right)   11. Cervical radiculopathy at C7 (Right)   12. DDD (degenerative disc disease), cervical   13. Abnormal MRI, cervical spine (09/26/2020)      Updated Problems: Problem  Abnormal MRI, cervical spine (09/26/2020)   FINDINGS: Alignment: Straightening of lordosis. Minimal grade 1 C4-5 anterolisthesis. Minimal grade 1 anterolisthesis at the C7-T1 and T1-T2 levels.  Vertebrae: Multilevel Modic type 2 endplate degenerative changes most prominent at the C5-7 level. Small left facet degenerative geode at the C3 level (7:14).  Partially imaged posterior spinal fusion hardware starting at the T3 level and extending inferiorly, terminating outside field view. Associated susceptibility artifact limits evaluation.  Cord: Normal signal and morphology.  Posterior Fossa, vertebral arteries: Negative.  Disc levels: Multilevel desiccation and disc space loss.  C2-3: No significant disc bulge, spinal canal or neural foraminal narrowing.  C3-4: Small disc osteophyte  complex with uncovertebral degenerative spurring. Patent spinal canal. Mild bilateral neural foraminal narrowing.  C4-5: Disc osteophyte complex with superimposed bilateral subarticular protrusions, uncovertebral and facet hypertrophy. Partial effacement of the ventral CSF containing spaces. Patent spinal canal. Moderate left and mild right neural foraminal narrowing.  C5-6: Disc osteophyte complex with shallow central protrusion. Uncovertebral and facet hypertrophy. Mild spinal canal and bilateral neural foraminal narrowing.  C6-7: Disc osteophyte complex with uncovertebral and facet hypertrophy. Mild spinal canal and bilateral neural foraminal narrowing.  C7-T1: Disc osteophyte complex with uncovertebral and facet hypertrophy. Partial effacement of the ventral CSF containing spaces. Patent spinal canal. Mild left greater than right neural foraminal narrowing.  T1-2: Disc osteophyte complex with uncovertebral and facet hypertrophy. Patent spinal canal and left neural foramen. Mild right neural foraminal narrowing.  Paraspinal tissues: Postsurgical appearance of the upper thoracic paraspinal soft tissues.  IMPRESSION: Mild C5-7 spinal canal narrowing. Moderate left C4-5 neural foraminal narrowing.  Mild bilateral C3-4, right C4-5, bilateral C5-T1 and right T1-2 neural foraminal narrowing.  Partially imaged thoracic fusion hardware.   Electronically Signed   By: Primitivo Gauze M.D.   On: 09/26/2020 16:08   Cervical facet syndrome (Bilateral)  Cervicogenic headache (Bilateral)  Occipital neuralgia (greater occipital nerve) (Bilateral)  Cervico-occipital neuralgia (Bilateral)  Cervical facet hypertrophy (Multilevel) (Bilateral)    Plan of Care  Problem-specific:  No problem-specific Assessment & Plan notes found for this encounter.  Maria Spencer has a current medication list which includes the following long-term medication(s): atorvastatin,  fluticasone, levothyroxine, lisinopril, metoprolol succinate, and zolpidem.  Pharmacotherapy (Medications Ordered): No orders of the  defined types were placed in this encounter.  Orders:  Orders Placed This Encounter  Procedures  . CERVICAL FACET (MEDIAL BRANCH NERVE BLOCK)     Standing Status:   Future    Standing Expiration Date:   11/22/2020    Scheduling Instructions:     Side: Bilateral     Level: C3-4, C4-5, C5-6 Facet joints (C3, C4, C5, C6, & C7 Medial Branch Nerves)     Sedation: Patient's choice.     Timeframe: As soon as schedule allows    Order Specific Question:   Where will this procedure be performed?    Answer:   [facility]   Follow-up plan:   Return for Procedure (w/ sedation): (B) C-FCT BLK #1.      Interventional treatment options: Planned, scheduled, and/or pending:   Diagnostic right T9-10 thoracic ESI #1    Under consideration:   Diagnostic right T9-10 thoracic ESI #1  Diagnostic bilateral T9, T10, T11, and T12 MMB thoracic facet RFA (DIFICULT CANDIDATE DUE TO HARDWARE)    Therapeutic/palliative (PRN):   Diagnostic bilateral T9, T10, T11, and T12 MMB thoracic facet block #3         Recent Visits Date Type Provider Dept  10/05/20 Procedure visit Milinda Pointer, MD [facility]  10/02/20 Office Visit Milinda Pointer, MD [facility]  09/14/20 Procedure visit Milinda Pointer, MD [facility]  09/13/20 Office Visit Milinda Pointer, MD [facility]  Showing recent visits within past 90 days and meeting all other requirements Today's Visits Date Type Provider Dept  10/23/20 Office Visit Milinda Pointer, MD [facility]  Showing today's visits and meeting all other requirements Future Appointments Date Type Provider Dept  10/26/20 Appointment Milinda Pointer, MD [facility]  Showing future appointments within next 90 days and meeting all other requirements  I discussed  the assessment and treatment plan with the patient. The patient was provided an opportunity to ask questions and all were answered. The patient agreed with the plan and demonstrated an understanding of the instructions.  Patient advised to call back or seek an in-person evaluation if the symptoms or condition worsens.  Duration of encounter: 37 minutes.  Note by: Gaspar Cola, MD Date: 10/23/2020; Time: 4:34 PM

## 2020-10-23 ENCOUNTER — Encounter: Payer: Self-pay | Admitting: Pain Medicine

## 2020-10-23 ENCOUNTER — Ambulatory Visit: Payer: Medicare Other | Attending: Pain Medicine | Admitting: Pain Medicine

## 2020-10-23 ENCOUNTER — Other Ambulatory Visit: Payer: Self-pay

## 2020-10-23 VITALS — BP 139/80 | HR 79 | Temp 97.2°F | Ht 59.0 in | Wt 127.0 lb

## 2020-10-23 DIAGNOSIS — R937 Abnormal findings on diagnostic imaging of other parts of musculoskeletal system: Secondary | ICD-10-CM

## 2020-10-23 DIAGNOSIS — G894 Chronic pain syndrome: Secondary | ICD-10-CM

## 2020-10-23 DIAGNOSIS — G8929 Other chronic pain: Secondary | ICD-10-CM | POA: Diagnosis not present

## 2020-10-23 DIAGNOSIS — M25519 Pain in unspecified shoulder: Secondary | ICD-10-CM | POA: Diagnosis not present

## 2020-10-23 DIAGNOSIS — Z96619 Presence of unspecified artificial shoulder joint: Secondary | ICD-10-CM | POA: Diagnosis not present

## 2020-10-23 DIAGNOSIS — M5030 Other cervical disc degeneration, unspecified cervical region: Secondary | ICD-10-CM | POA: Diagnosis not present

## 2020-10-23 DIAGNOSIS — M4722 Other spondylosis with radiculopathy, cervical region: Secondary | ICD-10-CM | POA: Diagnosis not present

## 2020-10-23 DIAGNOSIS — M47812 Spondylosis without myelopathy or radiculopathy, cervical region: Secondary | ICD-10-CM | POA: Diagnosis not present

## 2020-10-23 DIAGNOSIS — M542 Cervicalgia: Secondary | ICD-10-CM | POA: Diagnosis not present

## 2020-10-23 DIAGNOSIS — G4486 Cervicogenic headache: Secondary | ICD-10-CM | POA: Diagnosis not present

## 2020-10-23 DIAGNOSIS — M25512 Pain in left shoulder: Secondary | ICD-10-CM | POA: Insufficient documentation

## 2020-10-23 DIAGNOSIS — M5412 Radiculopathy, cervical region: Secondary | ICD-10-CM | POA: Diagnosis not present

## 2020-10-23 DIAGNOSIS — M25511 Pain in right shoulder: Secondary | ICD-10-CM

## 2020-10-23 DIAGNOSIS — M5481 Occipital neuralgia: Secondary | ICD-10-CM

## 2020-10-23 NOTE — Progress Notes (Signed)
Safety precautions to be maintained throughout the outpatient stay will include: orient to surroundings, keep bed in low position, maintain call bell within reach at all times, provide assistance with transfer out of bed and ambulation.  

## 2020-10-23 NOTE — Patient Instructions (Signed)
____________________________________________________________________________________________  Preparing for Procedure with Sedation  Procedure appointments are limited to planned procedures: . No Prescription Refills. . No disability issues will be discussed. . No medication changes will be discussed.  Instructions: . Oral Intake: Do not eat or drink anything for at least 8 hours prior to your procedure. (Exception: Blood Pressure Medication. See below.) . Transportation: Unless otherwise stated by your physician, you may drive yourself after the procedure. . Blood Pressure Medicine: Do not forget to take your blood pressure medicine with a sip of water the morning of the procedure. If your Diastolic (lower reading)is above 100 mmHg, elective cases will be cancelled/rescheduled. . Blood thinners: These will need to be stopped for procedures. Notify our staff if you are taking any blood thinners. Depending on which one you take, there will be specific instructions on how and when to stop it. . Diabetics on insulin: Notify the staff so that you can be scheduled 1st case in the morning. If your diabetes requires high dose insulin, take only  of your normal insulin dose the morning of the procedure and notify the staff that you have done so. . Preventing infections: Shower with an antibacterial soap the morning of your procedure. . Build-up your immune system: Take 1000 mg of Vitamin C with every meal (3 times a day) the day prior to your procedure. . Antibiotics: Inform the staff if you have a condition or reason that requires you to take antibiotics before dental procedures. . Pregnancy: If you are pregnant, call and cancel the procedure. . Sickness: If you have a cold, fever, or any active infections, call and cancel the procedure. . Arrival: You must be in the facility at least 30 minutes prior to your scheduled procedure. . Children: Do not bring children with you. . Dress appropriately:  Bring dark clothing that you would not mind if they get stained. . Valuables: Do not bring any jewelry or valuables.  Reasons to call and reschedule or cancel your procedure: (Following these recommendations will minimize the risk of a serious complication.) . Surgeries: Avoid having procedures within 2 weeks of any surgery. (Avoid for 2 weeks before or after any surgery). . Flu Shots: Avoid having procedures within 2 weeks of a flu shots or . (Avoid for 2 weeks before or after immunizations). . Barium: Avoid having a procedure within 7-10 days after having had a radiological study involving the use of radiological contrast. (Myelograms, Barium swallow or enema study). . Heart attacks: Avoid any elective procedures or surgeries for the initial 6 months after a "Myocardial Infarction" (Heart Attack). . Blood thinners: It is imperative that you stop these medications before procedures. Let us know if you if you take any blood thinner.  . Infection: Avoid procedures during or within two weeks of an infection (including chest colds or gastrointestinal problems). Symptoms associated with infections include: Localized redness, fever, chills, night sweats or profuse sweating, burning sensation when voiding, cough, congestion, stuffiness, runny nose, sore throat, diarrhea, nausea, vomiting, cold or Flu symptoms, recent or current infections. It is specially important if the infection is over the area that we intend to treat. . Heart and lung problems: Symptoms that may suggest an active cardiopulmonary problem include: cough, chest pain, breathing difficulties or shortness of breath, dizziness, ankle swelling, uncontrolled high or unusually low blood pressure, and/or palpitations. If you are experiencing any of these symptoms, cancel your procedure and contact your primary care physician for an evaluation.  Remember:  Regular Business hours are:    Monday to Thursday 8:00 AM to 4:00 PM  Provider's  Schedule: Kamareon Sciandra, MD:  Procedure days: Tuesday and Thursday 7:30 AM to 4:00 PM  Bilal Lateef, MD:  Procedure days: Monday and Wednesday 7:30 AM to 4:00 PM ____________________________________________________________________________________________    

## 2020-10-25 ENCOUNTER — Ambulatory Visit (INDEPENDENT_AMBULATORY_CARE_PROVIDER_SITE_OTHER): Payer: Medicare Other | Admitting: Gastroenterology

## 2020-10-25 ENCOUNTER — Telehealth: Payer: Self-pay | Admitting: Gastroenterology

## 2020-10-25 ENCOUNTER — Encounter: Payer: Self-pay | Admitting: Gastroenterology

## 2020-10-25 ENCOUNTER — Other Ambulatory Visit: Payer: Self-pay

## 2020-10-25 VITALS — BP 155/81 | HR 89 | Temp 98.4°F | Ht 59.0 in | Wt 132.2 lb

## 2020-10-25 DIAGNOSIS — R1032 Left lower quadrant pain: Secondary | ICD-10-CM

## 2020-10-25 DIAGNOSIS — K5909 Other constipation: Secondary | ICD-10-CM

## 2020-10-25 DIAGNOSIS — G8929 Other chronic pain: Secondary | ICD-10-CM | POA: Diagnosis not present

## 2020-10-25 MED ORDER — METRONIDAZOLE 500 MG PO TABS: 500.0000 mg | ORAL_TABLET | Freq: Two times a day (BID) | ORAL | 0 refills | Status: DC

## 2020-10-25 MED ORDER — CIPROFLOXACIN HCL 500 MG PO TABS: 500.0000 mg | ORAL_TABLET | Freq: Two times a day (BID) | ORAL | 0 refills | Status: AC

## 2020-10-25 NOTE — Telephone Encounter (Signed)
Called and left a message for call back  

## 2020-10-25 NOTE — Progress Notes (Signed)
Cephas Darby, MD [address]  [place]  [place], [place] [address]  Main: [phone]  Fax: [phone]    Gastroenterology Consultation  Referring Provider:     McLean-Scocuzza, Olivia Mackie * Primary Care Physician:  McLean-Scocuzza, Nino Glow, MD Primary Gastroenterologist:  Dr. Cephas Darby Reason for Consultation:     IBS, left lower quadrant pain        HPI:   Maria Spencer is a 74 y.o. female referred by Dr. Terese Door, Nino Glow, MD  for consultation & management of IBS. Patient reports having long-standing history of irritable bowel syndrome. She underwent back surgery in November since then she has been experiencing worsening of her IBS symptoms including abdominal cramps, severe gas/bloating which is mostly postprandial, cramps relieved after having a bowel movement. She reports having about 2 bowel movements daily without any blood. She does have bowel movement at night it is associated with straining, usually with flatus, although stools are not hard. She reports that she has been stressed recently with back surgery and also her husband with new diagnosis of metastatic bladder bladder cancer. She became tearful talking about her husband and about the stress. She reports that she tried probiotics which did not help. She tries to eat healthy, avoids red meat, carbonated beverages. She avoids gas producing foods as well. She also reports that she had an attack of diverticulitis last year and was given antibiotics. Her weight has been overall stable. Her hemoglobin was mildly low on most recent labs, normal MCV. She denies nausea, vomiting but reports loss of appetite and early satiety. She denies recent travel or antibiotic use or viral illness  Follow-up visit 02/22/2020 Patient reports that for last 6 months to 1 year, she has been experiencing postprandial abdominal cramps, bloating and urgency.  She reports having soft bowel movements to the abdomen after she  eats. Patient underwent CT renal stone study on 01/24/2020 for right-sided abdominal pain, microscopic hematuria and was found to have mild left-sided diverticulitis.  As she was not experiencing any symptoms, treatment was deferred at that time.  Today, she complains of left lower quadrant pain.  She denies nausea, vomiting.  Patient's husband passed away from cancer last year.  Currently, she lives alone in [place].  Her children live out of state.  Follow-up visit 10/25/2020 About a week ago, since Thanksgiving patient developed worsening of constipation that has resulted in worsening of lower abdominal pain, predominantly in the left lower quadrant.  She also felt nauseous.  She denied fever, chills.  She took Senokot in last couple of days that has resulted in soft stool.  Due to abdominal pain, patient called our office today and made an urgent visit.  Patient generally has chronic constipation, does not take any stool softeners regularly.  NSAIDs: none  Antiplts/Anticoagulants/Anti thrombotics: none  GI Procedures:  Upper endoscopy 05/14/2014, LA grade A esophagitis, gastric erythema, biopsies of the GE junction and duodenum were performed Biopsies were unremarkable  Colonoscopy 01/10/2009 Diverticulosis, hemorrhoids Normal colon mucosa, biopsies performed Pathology report not available  Colonoscopy 05/15/2020 - One 4 mm polyp in the transverse colon, removed with a cold snare. Resected and retrieved. - Diverticulosis in the sigmoid colon. - Non-bleeding external hemorrhoids. DIAGNOSIS:  A. COLON POLYP, TRANSVERSE; COLD SNARE:  - TUBULAR ADENOMA.  - NEGATIVE FOR HIGH-GRADE DYSPLASIA AND MALIGNANCY.   Past Medical History:  Diagnosis Date  . Arthritis    neck, back, left knee;   . Breast cancer ([place]) 2009  right lumpectomy s/p radiation   . Chicken pox   . Chronic UTI    established with urology   . Diverticular disease    -osis and -itis   . Esophagitis    egd 05/17/14  see report scanned into chart  . Hormone disorder   . Hyperlipidemia   . Hypertension    controlled well with medication;   . Personal history of radiation therapy 2009   F/U right breast cancer  . Thyroid disease    hypothyroidism     Past Surgical History:  Procedure Laterality Date  . BONE GRAFT HIP ILIAC CREST     + cage left hip 10/23/17   . BREAST BIOPSY Left 2009   clip,benign  . BREAST BIOPSY Right 2009   +  . BREAST LUMPECTOMY Right 2009   2009 lumpectomy   . CARDIAC CATHETERIZATION     No stents; [facility] doesn't recall facility +51yrs ago  . COLONOSCOPY WITH PROPOFOL N/A 05/15/2020   Procedure: COLONOSCOPY WITH PROPOFOL;  Surgeon: Lin Landsman, MD;  Location: [facility] ENDOSCOPY;  Service: Gastroenterology;  Laterality: N/A;  . EYE SURGERY     b/l cataracts sch 10/2019 in [place]  . INSERTION OF MESH N/A 05/29/2018   Procedure: INSERTION OF MESH;  Surgeon: Johnathan Hausen, MD;  Location: [facility] ORS;  Service: General;  Laterality: N/A;  . SHOULDER SURGERY     x2 surgeries both shoulders, right shoulder replacement last in 2012   . SPINAL FUSION    . SPINE SURGERY  10/23/2017   spinal 09/2017 h/o scoliosis [facility] L4-S1 OLIF T10 to ililum fusion  . TONSILLECTOMY     age 65 y.o.   . TOTAL SHOULDER REPLACEMENT    . VENTRAL HERNIA REPAIR N/A 05/29/2018   Procedure: LAPAROSCOPIC VENTRAL / [place];  Surgeon: Johnathan Hausen, MD;  Location: [facility] ORS;  Service: General;  Laterality: N/A;    Current Outpatient Medications:  .  atorvastatin (LIPITOR) 10 MG tablet, Take 1 tablet (10 mg total) by mouth every other day. Note 10 mg no need to cut in 1/2 as before you have 20 mg taking 1/2 pill =10 mg (Patient taking differently: Take 10 mg by mouth every other day. Note 10 mg no need to cut in 1/2 as before you have 20 mg taking 1/2 pill =10 mg), Disp: 90 tablet, Rfl: 3 .  Cholecalciferol 25 MCG (1000 UT) tablet, Take by mouth., Disp: , Rfl:  .  ergocalciferol (VITAMIN D2)  1.25 MG (50000 UT) capsule, Take by mouth., Disp: , Rfl:  .  fluticasone (FLONASE) 50 MCG/ACT nasal spray, Place 2 sprays into both nostrils daily. Max b/l nostrils, Disp: 48 g, Rfl: 4 .  FORTEO 600 MCG/2.4ML SOPN, , Disp: , Rfl:  .  gabapentin (NEURONTIN) 100 MG capsule, Take 200 mg by mouth 3 (three) times daily. , Disp: , Rfl:  .  levothyroxine (SYNTHROID) 88 MCG tablet, Take 1 tablet (88 mcg total) by mouth daily before breakfast. 30 minutes (Patient taking differently: Take 88 mcg by mouth daily before breakfast. 30 minutes), Disp: 90 tablet, Rfl: 3 .  lisinopril (ZESTRIL) 40 MG tablet, Take 1 tablet (40 mg total) by mouth daily., Disp: 90 tablet, Rfl: 3 .  metoprolol succinate (TOPROL-XL) 25 MG 24 hr tablet, Take 1 tablet (25 mg total) by mouth daily., Disp: 90 tablet, Rfl: 3 .  Multiple Vitamin (MULTI-VITAMINS) TABS, Take 1 tablet by mouth daily. , Disp: , Rfl:  .  zolpidem (AMBIEN) 5  MG tablet, Take 1 tablet (5 mg total) by mouth at bedtime as needed for sleep. (Patient taking differently: Take 5 mg by mouth at bedtime as needed for sleep. ), Disp: 90 tablet, Rfl: 1 .  ciprofloxacin (CIPRO) 500 MG tablet, Take 1 tablet (500 mg total) by mouth 2 (two) times daily for 7 days., Disp: 14 tablet, Rfl: 0 .  metroNIDAZOLE (FLAGYL) 500 MG tablet, Take 1 tablet (500 mg total) by mouth 2 (two) times daily., Disp: 14 tablet, Rfl: 0   Family History  Problem Relation Age of Onset  . Arthritis Mother   . Heart disease Mother 59  . Hyperlipidemia Mother   . Hypertension Mother   . Coronary artery disease Mother 27  . Arthritis Father   . Diabetes Father   . Cancer Father        colon  . AAA (abdominal aortic aneurysm) Father   . Arthritis Sister   . Hyperlipidemia Sister   . Hypertension Sister   . Cancer Brother        lung, smoker  . Mental retardation Brother   . Drug abuse Daughter        overdose in 2018   . Arthritis Sister   . Hyperlipidemia Sister   . Bladder Cancer Neg Hx   .  Kidney cancer Neg Hx   . Breast cancer Neg Hx      Social History   Tobacco Use  . Smoking status: Never Smoker  . Smokeless tobacco: Never Used  Vaping Use  . Vaping Use: Never used  Substance Use Topics  . Alcohol use: Not Currently    Alcohol/week: 7.0 standard drinks    Types: 7 Glasses of wine per week  . Drug use: No    Allergies as of 10/25/2020 - Review Complete 10/25/2020  Allergen Reaction Noted  . Sulfa antibiotics Anaphylaxis, Hives, Itching, Shortness Of Breath, and Swelling 06/13/2015  . Nitrofuran derivatives Swelling 01/05/2020  . Amlodipine besylate Other (See Comments) 09/10/2019  . Nitrofurantoin Hives and Other (See Comments) 06/13/2011    Review of Systems:    All systems reviewed and negative except where noted in HPI.   Physical Exam:  BP (!) 155/81 (BP Location: Left Arm, Patient Position: Sitting, Cuff Size: Normal)   Pulse 89   Temp 98.4 F (36.9 C) (Oral)   Ht 4\' 11"  (1.499 m)   Wt 132 lb 4 oz (60 kg)   BMI 26.71 kg/m  No LMP recorded. Patient is postmenopausal.  General:   Alert,  Well-developed, well-nourished, pleasant and cooperative in NAD Head:  Normocephalic and atraumatic. Eyes:  Sclera clear, no icterus.   Conjunctiva pink. Ears:  Normal auditory acuity. Nose:  No deformity, discharge, or lesions. Mouth:  No deformity or lesions,oropharynx pink & moist. Neck:  Supple; no masses or thyromegaly. Lungs:  Respirations even and unlabored.  Clear throughout to auscultation.   No wheezes, crackles, or rhonchi. No acute distress. Heart:  Regular rate and rhythm; no murmurs, clicks, rubs, or gallops. Abdomen:  Normal bowel sounds. Soft, lower abdominal tenderness and non-distended without masses, hepatosplenomegaly or hernias noted.  No guarding or rebound tenderness.   Rectal: Not performed Msk:  Symmetrical without gross deformities. Good, equal movement & strength bilaterally. Pulses:  Normal pulses noted. Extremities:  No clubbing or  edema.  No cyanosis. Neurologic:  Alert and oriented x3;  grossly normal neurologically. Skin:  Intact without significant lesions or rashes. No jaundice. Psych:  Alert and cooperative. Normal mood and  affect.  Imaging Studies: No abdominal imaging  Assessment and Plan:   Maria Spencer is a 74 y.o. female with long-standing history of IBS, back surgery, chronic constipation, history of uncomplicated left-sided diverticulitis is seen in consultation for 1 week history of worsening constipation associated with lower abdominal pain and bloating  Lower abdominal pain, constipation and bloating Likely secondary to worsening of constipation Recommended trial of MiraLAX 1-2 times daily, avoid Senokot Discussed about high-fiber diet, samples provided Also, given her a prescription for Cipro and Flagyl for 7 days if abdominal pain worsens despite treating constipation.  Discussed with patient about return to ER if symptoms worsen despite being on antibiotics   Follow up as needed   Cephas Darby, MD

## 2020-10-25 NOTE — Telephone Encounter (Signed)
Patient has had lower abdominal cramping. He states when symptoms first started she had constipation. She states now she is having painful bowel movements. Made appointment today at 3:15

## 2020-10-25 NOTE — Telephone Encounter (Signed)
Patient states she thinks she is having a a diverticulosis flare up. Her symptoms of abd pain and diarrhea started over the weekend. She has tried the simple diet of rice and apple sauce but it doesn't seem to help. Pt wanting to know if something can be called in or if she needs to come in and be seen. Pt last seen 6.2021

## 2020-10-26 ENCOUNTER — Ambulatory Visit (HOSPITAL_BASED_OUTPATIENT_CLINIC_OR_DEPARTMENT_OTHER): Payer: Medicare Other | Admitting: Pain Medicine

## 2020-10-26 ENCOUNTER — Other Ambulatory Visit: Payer: Self-pay

## 2020-10-26 ENCOUNTER — Encounter: Payer: Self-pay | Admitting: Pain Medicine

## 2020-10-26 ENCOUNTER — Ambulatory Visit
Admission: RE | Admit: 2020-10-26 | Discharge: 2020-10-26 | Disposition: A | Discharge status: Home / Self Care | Payer: Medicare Other | Source: Ambulatory Visit | Attending: Pain Medicine | Admitting: Pain Medicine

## 2020-10-26 VITALS — BP 148/94 | HR 80 | Temp 97.2°F | Resp 16 | Ht 59.0 in | Wt 132.0 lb

## 2020-10-26 DIAGNOSIS — M47812 Spondylosis without myelopathy or radiculopathy, cervical region: Secondary | ICD-10-CM | POA: Diagnosis not present

## 2020-10-26 DIAGNOSIS — M542 Cervicalgia: Secondary | ICD-10-CM | POA: Insufficient documentation

## 2020-10-26 DIAGNOSIS — M5030 Other cervical disc degeneration, unspecified cervical region: Secondary | ICD-10-CM

## 2020-10-26 DIAGNOSIS — M5382 Other specified dorsopathies, cervical region: Secondary | ICD-10-CM | POA: Diagnosis not present

## 2020-10-26 MED ORDER — MIDAZOLAM HCL 5 MG/5ML IJ SOLN: 1.0000 mg | INTRAMUSCULAR | Status: DC | PRN

## 2020-10-26 MED ORDER — FENTANYL CITRATE (PF) 100 MCG/2ML IJ SOLN: 25.0000 ug | INTRAMUSCULAR | Status: DC | PRN

## 2020-10-26 MED ORDER — FENTANYL CITRATE (PF) 100 MCG/2ML IJ SOLN
INTRAMUSCULAR | Status: AC
  Filled 2020-10-26: qty 2

## 2020-10-26 MED ORDER — ROPIVACAINE HCL 2 MG/ML IJ SOLN
18.0000 mL | Freq: Once | INTRAMUSCULAR | Status: AC
  Administered 2020-10-26: 18 mL via PERINEURAL

## 2020-10-26 MED ORDER — DEXAMETHASONE SODIUM PHOSPHATE 10 MG/ML IJ SOLN
20.0000 mg | Freq: Once | INTRAMUSCULAR | Status: AC
  Administered 2020-10-26: 20 mg

## 2020-10-26 MED ORDER — LIDOCAINE HCL 2 % IJ SOLN
20.0000 mL | Freq: Once | INTRAMUSCULAR | Status: AC
  Administered 2020-10-26: 400 mg

## 2020-10-26 MED ORDER — LIDOCAINE HCL (PF) 2 % IJ SOLN
INTRAMUSCULAR | Status: AC
  Filled 2020-10-26: qty 10

## 2020-10-26 MED ORDER — ROPIVACAINE HCL 2 MG/ML IJ SOLN
INTRAMUSCULAR | Status: AC
  Filled 2020-10-26: qty 20

## 2020-10-26 MED ORDER — LACTATED RINGERS IV SOLN: 1000.0000 mL | Freq: Once | INTRAVENOUS | Status: DC

## 2020-10-26 MED ORDER — TRIAMCINOLONE ACETONIDE 40 MG/ML IJ SUSP
INTRAMUSCULAR | Status: AC
  Filled 2020-10-26: qty 2

## 2020-10-26 MED ORDER — MIDAZOLAM HCL 5 MG/5ML IJ SOLN
INTRAMUSCULAR | Status: AC
  Filled 2020-10-26: qty 5

## 2020-10-26 MED ORDER — DEXAMETHASONE SODIUM PHOSPHATE 10 MG/ML IJ SOLN
INTRAMUSCULAR | Status: AC
  Filled 2020-10-26: qty 12

## 2020-10-26 NOTE — Patient Instructions (Signed)

## 2020-10-26 NOTE — Progress Notes (Signed)
Safety precautions to be maintained throughout the outpatient stay will include: orient to surroundings, keep bed in low position, maintain call bell within reach at all times, provide assistance with transfer out of bed and ambulation.  

## 2020-10-26 NOTE — Progress Notes (Signed)
PROVIDER NOTE: Information contained herein reflects review and annotations entered in association with encounter. Interpretation of such information and data should be left to medically-trained personnel. Information provided to patient can be located elsewhere in the medical record under "Patient Instructions". Document created using STT-dictation technology, any transcriptional errors that may result from process are unintentional.    Patient: Maria Spencer  Service Category: Procedure  Provider: Gaspar Cola, MD  DOB: 1946-08-25  DOS: 10/26/2020  Location: [facility]  MRN: [id number]  Setting: Ambulatory - outpatient  Referring Provider: McLean-Scocuzza, Olivia Mackie *  Type: Established Patient  Specialty: Interventional Pain Management  PCP: McLean-Scocuzza, Nino Glow, MD   Primary Reason for Visit: Interventional Pain Management Treatment. CC: Neck Pain (bilateral )  Procedure:          Anesthesia, Analgesia, Anxiolysis:  Type: Cervical Facet Medial Branch Block(s)  #1  Primary Purpose: Diagnostic Region: Posterolateral cervical spine Level: C3, C4, C5, C6, & C7 Medial Branch Level(s). Injecting these levels blocks the C3-4, C4-5, C5-6, and C6-7 cervical facet joints. Laterality: Bilateral  Type: Local Anesthesia Indication(s): Analgesia         Route: Infiltration ([place]/IM) IV Access: Declined Sedation: Declined  Local Anesthetic: Lidocaine 1-2%  Position: Prone with head of the table raised to facilitate breathing.   Indications: 1. Cervical facet syndrome (Bilateral)   2. Cervicalgia   3. Spondylosis without myelopathy or radiculopathy, cervical region   4. DDD (degenerative disc disease), cervical   5. Cervical facet hypertrophy (Multilevel) (Bilateral)    Pain Score: Pre-procedure: 5 /10 Post-procedure: 2 /10   Pre-op H&P Assessment:  Maria Spencer is a 74 y.o. (year old), female patient, seen today for interventional treatment. She  has a past surgical  history that includes Total shoulder replacement; Shoulder surgery; Tonsillectomy; Bone graft hip iliac crest; Spine surgery (10/23/2017); Ventral hernia repair (N/A, 05/29/2018); Insertion of mesh (N/A, 05/29/2018); Eye surgery; Cardiac catheterization; Breast lumpectomy (Right, 2009); Breast biopsy (Left, 2009); Breast biopsy (Right, 2009); Colonoscopy with propofol (N/A, 05/15/2020); and Spinal fusion. [name] has a current medication list which includes the following prescription(s): atorvastatin, cholecalciferol, ergocalciferol, fluticasone, forteo, gabapentin, levothyroxine, lisinopril, metoprolol succinate, metronidazole, multi-vitamins, zolpidem, and ciprofloxacin. Her primarily concern today is the Neck Pain (bilateral )  Initial Vital Signs:  Pulse/HCG Rate: 75  Temp: (!) 97.2 F (36.2 C) Resp: 16 BP: (!) 157/82 SpO2: 98 %  BMI: Estimated body mass index is 26.66 kg/m as calculated from the following:   Height as of this encounter: 4\' 11"  (1.499 m).   Weight as of this encounter: 132 lb (59.9 kg).  Risk Assessment: Allergies: Reviewed. She is allergic to sulfa antibiotics, nitrofuran derivatives, amlodipine besylate, and nitrofurantoin.  Allergy Precautions: None required Coagulopathies: Reviewed. None identified.  Blood-thinner therapy: None at this time Active Infection(s): Reviewed. None identified. Maria Spencer is afebrile  Site Confirmation: Maria Spencer was asked to confirm the procedure and laterality before marking the site Procedure checklist: Completed Consent: Before the procedure and under the influence of no sedative(s), amnesic(s), or anxiolytics, the patient was informed of the treatment options, risks and possible complications. To fulfill our ethical and legal obligations, as recommended by the American Medical Association's Code of Ethics, I have informed the patient of my clinical impression; the nature and purpose of the treatment or procedure; the risks, benefits, and  possible complications of the intervention; the alternatives, including doing nothing; the risk(s) and benefit(s) of the alternative treatment(s) or procedure(s); and the risk(s) and benefit(s)  of doing nothing. The patient was provided information about the general risks and possible complications associated with the procedure. These may include, but are not limited to: failure to achieve desired goals, infection, bleeding, organ or nerve damage, allergic reactions, paralysis, and death. In addition, the patient was informed of those risks and complications associated to Spine-related procedures, such as failure to decrease pain; infection (i.e.: Meningitis, epidural or intraspinal abscess); bleeding (i.e.: epidural hematoma, subarachnoid hemorrhage, or any other type of intraspinal or peri-dural bleeding); organ or nerve damage (i.e.: Any type of peripheral nerve, nerve root, or spinal cord injury) with subsequent damage to sensory, motor, and/or autonomic systems, resulting in permanent pain, numbness, and/or weakness of one or several areas of the body; allergic reactions; (i.e.: anaphylactic reaction); and/or death. Furthermore, the patient was informed of those risks and complications associated with the medications. These include, but are not limited to: allergic reactions (i.e.: anaphylactic or anaphylactoid reaction(s)); adrenal axis suppression; blood sugar elevation that in diabetics may result in ketoacidosis or comma; water retention that in patients with history of congestive heart failure may result in shortness of breath, pulmonary edema, and decompensation with resultant heart failure; weight gain; swelling or edema; medication-induced neural toxicity; particulate matter embolism and blood vessel occlusion with resultant organ, and/or nervous system infarction; and/or aseptic necrosis of one or more joints. Finally, the patient was informed that Medicine is not an exact science; therefore, there  is also the possibility of unforeseen or unpredictable risks and/or possible complications that may result in a catastrophic outcome. The patient indicated having understood very clearly. We have given the patient no guarantees and we have made no promises. Enough time was given to the patient to ask questions, all of which were answered to the patient's satisfaction. Ms. Chovan has indicated that she wanted to continue with the procedure. Attestation: I, the ordering provider, attest that I have discussed with the patient the benefits, risks, side-effects, alternatives, likelihood of achieving goals, and potential problems during recovery for the procedure that I have provided informed consent. Date  Time: 10/26/2020  9:24 AM  Pre-Procedure Preparation:  Monitoring: As per clinic protocol. Respiration, ETCO2, SpO2, BP, heart rate and rhythm monitor placed and checked for adequate function Safety Precautions: Patient was assessed for positional comfort and pressure points before starting the procedure. Time-out: I initiated and conducted the "Time-out" before starting the procedure, as per protocol. The patient was asked to participate by confirming the accuracy of the "Time Out" information. Verification of the correct person, site, and procedure were performed and confirmed by me, the nursing staff, and the patient. "Time-out" conducted as per Joint Commission's Universal Protocol (UP.01.01.01). Time: 0945  Description of Procedure:          Laterality: Bilateral. The procedure was performed in identical fashion on both sides. Level: C3, C4, C5, C6, & C7 Medial Branch Level(s). Area Prepped: Posterior Cervico-thoracic Region DuraPrep (Iodine Povacrylex [0.7% available iodine] and Isopropyl Alcohol, 74% w/w) Safety Precautions: Aspiration looking for blood return was conducted prior to all injections. At no point did we inject any substances, as a needle was being advanced. Before injecting, the  patient was told to immediately notify me if she was experiencing any new onset of "ringing in the ears, or metallic taste in the mouth". No attempts were made at seeking any paresthesias. Safe injection practices and needle disposal techniques used. Medications properly checked for expiration dates. SDV (single dose vial) medications used. After the completion of the procedure, all  disposable equipment used was discarded in the proper designated medical waste containers. Local Anesthesia: Protocol guidelines were followed. The patient was positioned over the fluoroscopy table. The area was prepped in the usual manner. The time-out was completed. The target area was identified using fluoroscopy. A 12-in long, straight, sterile hemostat was used with fluoroscopic guidance to locate the targets for each level blocked. Once located, the skin was marked with an approved surgical skin marker. Once all sites were marked, the skin (epidermis, dermis, and hypodermis), as well as deeper tissues (fat, connective tissue and muscle) were infiltrated with a small amount of a short-acting local anesthetic, loaded on a 10cc syringe with a 25G, 1.5-in  Needle. An appropriate amount of time was allowed for local anesthetics to take effect before proceeding to the next step. Local Anesthetic: Lidocaine 2.0% The unused portion of the local anesthetic was discarded in the proper designated containers. Technical explanation of process:  C3 Medial Branch Nerve Block (MBB): The target area for the C3 dorsal medial articular branch is the lateral concave waist of the articular pillar of C3. Under fluoroscopic guidance, a Quincke needle was inserted until contact was made with os over the postero-lateral aspect of the articular pillar of C3 (target area). After negative aspiration for blood, 0.5 mL of the nerve block solution was injected without difficulty or complication. The needle was removed intact. C4 Medial Branch Nerve Block  (MBB): The target area for the C4 dorsal medial articular branch is the lateral concave waist of the articular pillar of C4. Under fluoroscopic guidance, a Quincke needle was inserted until contact was made with os over the postero-lateral aspect of the articular pillar of C4 (target area). After negative aspiration for blood, 0.5 mL of the nerve block solution was injected without difficulty or complication. The needle was removed intact. C5 Medial Branch Nerve Block (MBB): The target area for the C5 dorsal medial articular branch is the lateral concave waist of the articular pillar of C5. Under fluoroscopic guidance, a Quincke needle was inserted until contact was made with os over the postero-lateral aspect of the articular pillar of C5 (target area). After negative aspiration for blood, 0.5 mL of the nerve block solution was injected without difficulty or complication. The needle was removed intact. C6 Medial Branch Nerve Block (MBB): The target area for the C6 dorsal medial articular branch is the lateral concave waist of the articular pillar of C6. Under fluoroscopic guidance, a Quincke needle was inserted until contact was made with os over the postero-lateral aspect of the articular pillar of C6 (target area). After negative aspiration for blood, 0.5 mL of the nerve block solution was injected without difficulty or complication. The needle was removed intact. C7 Medial Branch Nerve Block (MBB): The target for the C7 dorsal medial articular branch lies on the superior-medial tip of the C7 transverse process. Under fluoroscopic guidance, a Quincke needle was inserted until contact was made with os over the postero-lateral aspect of the articular pillar of C7 (target area). After negative aspiration for blood, 0.5 mL of the nerve block solution was injected without difficulty or complication. The needle was removed intact. Procedural Needles: 22-gauge, 3.5-inch, Quincke needles used for all levels. Nerve  block solution: 0.2% PF-Ropivacaine + Triamcinolone (40 mg/mL) diluted to a final concentration of 4 mg of Triamcinolone/mL of Ropivacaine The unused portion of the solution was discarded in the proper designated containers.  Once the entire procedure was completed, the treated area was cleaned, making sure to  leave some of the prepping solution back to take advantage of its long term bactericidal properties.  Anatomy Reference Guide:       Vitals:   10/26/20 0940 10/26/20 0950 10/26/20 1000 10/26/20 1005  BP: (!) 148/88 (!) 133/100 (!) 157/101 (!) 148/94  Pulse: 76 80 79 80  Resp: 15 16 14 16   Temp:      TempSrc:      SpO2: 99% 98% 99% 98%  Weight:      Height:        Start Time: 0945 hrs. End Time: 0959 hrs.  Imaging Guidance (Spinal):          Type of Imaging Technique: Fluoroscopy Guidance (Spinal) Indication(s): Assistance in needle guidance and placement for procedures requiring needle placement in or near specific anatomical locations not easily accessible without such assistance. Exposure Time: Please see nurses notes. Contrast: None used. Fluoroscopic Guidance: I was personally present during the use of fluoroscopy. "Tunnel Vision Technique" used to obtain the best possible view of the target area. Parallax error corrected before commencing the procedure. "Direction-depth-direction" technique used to introduce the needle under continuous pulsed fluoroscopy. Once target was reached, antero-posterior, oblique, and lateral fluoroscopic projection used confirm needle placement in all planes. Images permanently stored in EMR. Interpretation: No contrast injected. I personally interpreted the imaging intraoperatively. Adequate needle placement confirmed in multiple planes. Permanent images saved into the patient's record.  Antibiotic Prophylaxis:   Anti-infectives (From admission, onward)   None     Indication(s): None identified  Post-operative Assessment:  Post-procedure  Vital Signs:  Pulse/HCG Rate: 80  Temp: (!) 97.2 F (36.2 C) Resp: 16 BP: (!) 148/94 SpO2: 98 %  EBL: None  Complications: No immediate post-treatment complications observed by team, or reported by patient.  Note: The patient tolerated the entire procedure well. A repeat set of vitals were taken after the procedure and the patient was kept under observation following institutional policy, for this type of procedure. Post-procedural neurological assessment was performed, showing return to baseline, prior to discharge. The patient was provided with post-procedure discharge instructions, including a section on how to identify potential problems. Should any problems arise concerning this procedure, the patient was given instructions to immediately contact us, at any time, without hesitation. In any case, we plan to contact the patient by telephone for a follow-up status report regarding this interventional procedure.  Comments:  No additional relevant information.  Plan of Care  Orders:  Orders Placed This Encounter  Procedures  . CERVICAL FACET (MEDIAL BRANCH NERVE BLOCK)     Scheduling Instructions:     Side: Bilateral     Level: C3-4, C4-5, C5-6 Facet joints (C3, C4, C5, C6, & C7 Medial Branch Nerves)     Sedation: Patient's choice.     Timeframe: Today    Order Specific Question:   Where will this procedure be performed?    Answer:   [facility]  . [facility] C-ARM 1-60 MIN NO REPORT    Intraoperative interpretation by procedural physician at [place].    Standing Status:   Standing    Number of Occurrences:   1    Order Specific Question:   Reason for exam:    Answer:   Assistance in needle guidance and placement for procedures requiring needle placement in or near specific anatomical locations not easily accessible without such assistance.  . Provide equipment / supplies at bedside    "Block Tray" (Disposable  single use) Needle  type:  SpinalSpinal Amount/quantity: 5 Size: Regular (3.5-inch) Gauge: 22G    Standing Status:   Standing    Number of Occurrences:   1    Order Specific Question:   Specify    Answer:   Block Tray  . Informed Consent Details: Physician/Practitioner Attestation; Transcribe to consent form and obtain patient signature    Note: Always confirm laterality of pain with Ms. Walthall, before procedure. Transcribe to consent form and obtain patient signature.    Order Specific Question:   Physician/Practitioner attestation of informed consent for procedure/surgical case    Answer:   I, the physician/practitioner, attest that I have discussed with the patient the benefits, risks, side effects, alternatives, likelihood of achieving goals and potential problems during recovery for the procedure that I have provided informed consent.    Order Specific Question:   Procedure    Answer:   Bilateral Cervical facet block under fluoroscopic guidance.    Order Specific Question:   Physician/Practitioner performing the procedure    Answer:   Beautifull Cisar A. 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 Highest recorded MME/day: 75 mg/day MME/day: 0 mg/day   Medications ordered for procedure: Meds ordered this encounter  Medications  . lidocaine (XYLOCAINE) 2 % (with pres) injection 400 mg  . DISCONTD: lactated ringers infusion 1,000 mL  . DISCONTD: midazolam (VERSED) 5 MG/5ML injection 1-2 mg    Make sure Flumazenil is available in the pyxis when using this medication. If oversedation occurs, administer 0.2 mg IV over 15 sec. If after 45 sec no response, administer 0.2 mg again over 1 min; may repeat at 1 min intervals; not to exceed 4 doses (1 mg)  . DISCONTD: fentaNYL (SUBLIMAZE) injection 25-50 mcg    Make sure Narcan is available in the pyxis when using  this medication. In the event of respiratory depression (RR< 8/min): Titrate NARCAN (naloxone) in increments of 0.1 to 0.2 mg IV at 2-3 minute intervals, until desired degree of reversal.  . ropivacaine (PF) 2 mg/mL (0.2%) (NAROPIN) injection 18 mL  . dexamethasone (DECADRON) injection 20 mg   Medications administered: We administered lidocaine, ropivacaine (PF) 2 mg/mL (0.2%), and dexamethasone.  See the medical record for exact dosing, route, and time of administration.  Follow-up plan:   Return in about 2 weeks (around 11/09/2020) for (F2F), (PP) Follow-up.       Interventional treatment options: Planned, scheduled, and/or pending:   Diagnostic right T9-10 thoracic ESI #1    Under consideration:   Diagnostic right T9-10 thoracic ESI #1  Diagnostic bilateral T9, T10, T11, and T12 MMB thoracic facet RFA (DIFICULT CANDIDATE DUE TO HARDWARE)    Therapeutic/palliative (PRN):   Diagnostic bilateral T9, T10, T11, and T12 MMB thoracic facet block #3     Recent Visits Date Type Provider Dept  10/23/20 Office Visit Milinda Pointer, [facility]  10/05/20 Procedure visit Milinda Pointer, MD [facility]  10/02/20 Office Visit Milinda Pointer, MD [facility]  09/14/20 Procedure visit Milinda Pointer, MD [facility]  09/13/20 Office Visit Milinda Pointer, MD [facility]  Showing recent visits within past 90 days and meeting all other requirements Today's Visits Date Type Provider Dept  10/26/20 Procedure visit Milinda Pointer, MD [facility]  Showing today's visits and meeting all other requirements Future  Appointments Date Type Provider Dept  11/08/20 Appointment Milinda Pointer, MD [facility]  Showing future appointments within next 90 days and meeting all other requirements  Disposition: Discharge home  Discharge (Date  Time): 10/26/2020; 1010 hrs.   Primary Care Physician:  McLean-Scocuzza, Nino Glow, MD Location: [facility] Note by: Gaspar Cola, MD Date: 10/26/2020; Time: 1:02 PM  Disclaimer:  Medicine is not an Chief Strategy Officer. The only guarantee in medicine is that nothing is guaranteed. It is important to note that the decision to proceed with this intervention was based on the information collected from the patient. The Data and conclusions were drawn from the patient's questionnaire, the interview, and the physical examination. Because the information was provided in large part by the patient, it cannot be guaranteed that it has not been purposely or unconsciously manipulated. Every effort has been made to obtain as much relevant data as possible for this evaluation. It is important to note that the conclusions that lead to this procedure are derived in large part from the available data. Always take into account that the treatment will also be dependent on availability of resources and existing treatment guidelines, considered by other Pain Management Practitioners as being common knowledge and practice, at the time of the intervention. For Medico-Legal purposes, it is also important to point out that variation in procedural techniques and pharmacological choices are the acceptable norm. The indications, contraindications, technique, and results of the above procedure should only be interpreted and judged by a Board-Certified Interventional Pain Specialist with extensive familiarity and expertise in the same exact procedure and technique.

## 2020-10-27 ENCOUNTER — Telehealth: Payer: Self-pay | Admitting: *Deleted

## 2020-10-27 NOTE — Telephone Encounter (Signed)
Spoke with patient after procedure on yesterday.  Other than stiffness denies any questions or concerns.  She did use ice on yesterday.  I told her that if she feels it would benefit her she could move to heat today.  Patient verbalizes u/o information.

## 2020-10-31 ENCOUNTER — Other Ambulatory Visit: Payer: Self-pay | Admitting: Surgery

## 2020-10-31 DIAGNOSIS — M25559 Pain in unspecified hip: Secondary | ICD-10-CM

## 2020-11-02 ENCOUNTER — Other Ambulatory Visit: Payer: Self-pay | Admitting: Pain Medicine

## 2020-11-02 DIAGNOSIS — M795 Residual foreign body in soft tissue: Secondary | ICD-10-CM | POA: Diagnosis not present

## 2020-11-02 DIAGNOSIS — Z981 Arthrodesis status: Secondary | ICD-10-CM | POA: Diagnosis not present

## 2020-11-02 DIAGNOSIS — M47816 Spondylosis without myelopathy or radiculopathy, lumbar region: Secondary | ICD-10-CM | POA: Diagnosis not present

## 2020-11-02 DIAGNOSIS — I878 Other specified disorders of veins: Secondary | ICD-10-CM | POA: Diagnosis not present

## 2020-11-02 DIAGNOSIS — M4324 Fusion of spine, thoracic region: Secondary | ICD-10-CM | POA: Diagnosis not present

## 2020-11-02 DIAGNOSIS — M4326 Fusion of spine, lumbar region: Secondary | ICD-10-CM | POA: Diagnosis not present

## 2020-11-02 NOTE — Progress Notes (Unsigned)
Today I have communicated with [name] who recommended that the patient consider repeating the cervical epidural steroid injection.  I will have my staff contact her to see if she would like to make arrangements for that.

## 2020-11-05 NOTE — Progress Notes (Signed)
PROVIDER NOTE: Information contained herein reflects review and annotations entered in association with encounter. Interpretation of such information and data should be left to medically-trained personnel. Information provided to patient can be located elsewhere in the medical record under "Patient Instructions". Document created using STT-dictation technology, any transcriptional errors that may result from process are unintentional.    Patient: Maria Spencer  Service Category: E/M  Provider: Gaspar Cola, MD  DOB: 1946-02-11  DOS: 11/08/2020  Specialty: Interventional Pain Management  MRN: [id number]  Setting: Ambulatory outpatient  PCP: McLean-Scocuzza, Nino Glow, MD  Type: Established Patient    Referring Provider: Orland Mustard *  Location: Office  Delivery: Face-to-face     HPI  Ms. Alica Shellhammer, a 74 y.o. year old female, is here today because of her Chronic pain syndrome [G89.4]. Ms. Rutt primary complain today is Neck Pain Last encounter: [name] was on 10/26/2020. Pertinent problems: Ms. 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; Arthritis; Chronic hip pain (2ry area of Pain) (Bilateral) (R>L); Mid back pain; Chronic low back pain with sciatica; Chronic thoracic back pain (1ry area of Pain) (Bilateral) (R>L); Fusion of spine of lumbosacral region; Lumbosacral spondylosis with radiculopathy; Chronic pain syndrome; Chronic lower extremity pain (3ry area of Pain) (Right); Chronic knee pain (4th area of Pain) (Posterior) (Left); Thoracic facet syndrome (Bilateral); Chronic sacroiliac joint pain (Bilateral); Spondylosis without myelopathy or radiculopathy, thoracic region; Closed wedge compression fracture of T8 vertebra ([place]); Osteoarthritis of hips (Bilateral); Abnormal thoracolumbar CT myelogram (01/05/2020); Breast cancer ([place]); Fusion of  thoracolumbar spine (T9 to Pelvis); DDD (degenerative disc disease), cervical; DDD (degenerative disc disease), thoracic; DDD (degenerative disc disease), lumbar; Displacement of thoracic intervertebral disc (C6-T3, T4-5, T6-7, T8-9); Non-traumatic compression fracture of T8 thoracic vertebra, sequela; Atypical chest pain; Cervicalgia; Chronic upper extremity pain (Right); Cervical spondylosis with radiculopathy (Right); Cervical radiculopathy at C7 (Right); Chronic shoulder pain (Bilateral) (R>L); Chronic shoulder pain after shoulder replacement (Right); Osteoarthritis of AC (acromioclavicular) joints (Bilateral); Chronic Acromioclavicular (AC) joint pain (Bilateral); Osteoarthritis of glenohumeral joint (Left); Tear of right gluteus minimus tendon; Abnormal MRI, cervical spine (09/26/2020); Cervical facet syndrome (Bilateral); Cervicogenic headache (Bilateral); Occipital neuralgia (greater occipital nerve) (Bilateral); Cervico-occipital neuralgia (Bilateral); Cervical facet hypertrophy (Multilevel) (Bilateral); Spondylosis without myelopathy or radiculopathy, cervical region; Scoliosis of thoracic spine, unspecified scoliosis type; and 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 Pain Assessment: Severity of Chronic pain is reported as a 5  (5/10 shoulder, 2/10 top of head)/10. Location: Neck Right,Left/shoulders bilaterally and up to top of head. Onset: More than a month ago. Quality: Sharp. Timing: Constant. Modifying factor(s): Nothing. Vitals:  height is 4' 11" (1.499 m) and weight is 127 lb (57.6 kg). Her temperature is 97 F (36.1 C) (abnormal). Her blood pressure is 145/86 (abnormal) and her pulse is 85. Her respiration is 16 and oxygen saturation is 99%.   Reason for encounter: post-procedure assessment.  This patient has a very complicated chronic pain history with spinal pain affecting the cervical, thoracic, and lumbar regions.  She has a history  of thoracolumbar scoliosis and she also seems to be having facet arthropathy and facet arthralgia affecting her cervical, thoracic, and lumbar regions.  The patient initially indicated that her first diagnostic bilateral cervical facet block provided her with 75% relief of the pain for the first hour, followed by a decrease to 50% improvement  for the following 4 to 6 hours and then it went to 0% improvement (back to baseline) after those initial 4 to 6 hours.  The patient had the procedure done without any sedation and on the day of the procedure she indicated that her initial pain score was a 5/10 and after the procedure he had gone down to a 2/10.  This would represent about a 60% improvement, before she left the office.  Today I took an order amount of time to explain to the patient the process involved with the diagnostic spinal injections and what information we want to gather from those.  I explained in detail the purpose of the steroids as well as the local anesthetics and what each 1 of those will reveal to [place] on follow-up.  Prior to the block, the patient had complaints of bilateral cervicalgia, bilateral cervical occipital neuralgia, and bilateral cervicogenic headaches.  She also had a complaint of bilateral shoulder pain despite having a right total shoulder replacement.  Initially she also seemed to be having a right C7 radiculopathy.  I personally went over again the results of the injection and now that the patient understands the process she was able to provide me with better, more reliable information.  She indicated that for the initial 4 to 6 hours she had complete relief of the neck pain and her headaches.  These headaches are located bilaterally in the occipital region and go to the top of the head following the distribution of the greater occipital nerve, bilaterally.  While the local anesthetic was in place she was experiencing no headaches.  With regards to the shoulder pain she  indicated also having attained 100% relief of that for the duration of the local anesthetic.  Once the local anesthetic wore off, she states that the headaches are better and the neck pain is also better, and she has been able to enjoy any increase range of motion of the cervical spine which was extremely limited prior to the procedure.  She is still experiencing sharp pain in the right upper back over the area of the trapezius muscle, that appears not to have improved significantly with the diagnostic injection but did go away while the local anesthetic was in place.  She continues to have numbness in the middle finger of her right hand, which started after she had her right total shoulder replacement done.  This has not gotten any better.  The patient was recently evaluated by [name] and she has been referred to Dr. Manuella Ghazi.  She is not sure why she has been referred to Dr. Manuella Ghazi, but I believe this is likely to be the purpose of doing an upper extremity EMG/PNCV to assess whether the apparent C7 radiculopathy is coming from the cervical region or if it is more distal.  According to the patient, [name] has recommended trying some cervical epidural steroid injections for her pain.  Since I first evaluated this patient on 05/08/2020, we have done to bilateral thoracic facet blocks prior to the extension of her thoracic fusion.  The first bilateral thoracic medial branch block (T9, T10, T11, and T12) was done on 05/11/2020 and the second one on 06/06/2020.  Follow-up evaluation of the first diagnostic thoracic medial branch block done on 06/01/2020 revealed that the patient had attained 100% relief of the pain for the duration of the local anesthetic with the mid back pain going back to baseline but attaining 100% relief of her low back pain which was  ongoing at the time of this follow-up evaluation.  Follow-up evaluation of the second thoracic facet medial branch block on 06/22/2020 revealed similar results.   After that the patient went on to have an extension on her thoracic fusion.  The patient indicates having had her second thoracic surgery on 07/24/2020.  On 09/14/2020 we went ahead and did a right-sided C7-T1 cervical epidural steroid injection under fluoroscopic guidance, no sedation.  Follow-up evaluation on 10/03/2019 one of the right cervical epidural steroid injection done on 09/14/2020 revealed that the patient had attained 100% relief of the pain for the duration of the local anesthetic which then extended to a 90 to 100% relief of her right upper extremity pain.  However, the cervicalgia returned after the 2 weeks.  This would suggest and confirms that the patient's right upper extremity symptoms are in fact coming from pathology in the inner portion of the spine while the cervicalgia was likely to be secondary to the posterior elements.   She continued to complain about pain in the shoulder region (area between the cervical spine and the shoulder proper).  On 10/05/2020 I went ahead and did a diagnostic bilateral suprascapular nerve block under fluoroscopic guidance and IV sedation.  The patient continued to complain of the bilateral shoulder pain which she was convinced was coming from the shoulder joint itself.  To rule this out we then went ahead and proceeded with the above-mentioned diagnostic bilateral suprascapular nerve block.  On follow-up on 10/23/2020 the patient indicated that the diagnostic bilateral suprascapular nerve block have provided her with 100% relief of the pain for the duration of the local anesthetic which lasted only 1 additional day after that.  This again suggested to me that the pain was not originating in the suprascapular region but that there was definitely a component of the pain coming from the shoulder joints.  Finally, on 10/26/2020 we did a bilateral cervical facet block under fluoroscopic guidance, no sedation.  This is what the patient is coming to the clinic today  to evaluate.  Follow-up evaluation of the bilateral cervical facet block has yielded the above-mentioned results.   Today I have spent a great deal of time with the patient explaining to her the difference between pathology coming from the inner spine versus pathology and pain coming from the posterior elements of the spine.  I also spent a lot of time explaining to her about our diagnostic nerve block so that she would understand better what were looking for and be specific as to what she reports back.  Based on the results from this injection, it is my impression that the patient may have involvement of the thoracic facets in the area between the cervical spine and where she had her last thoracic fusion done.  To test this I have recommended to bring the patient back for a diagnostic right T1-T5 thoracic facet medial branch block.  The patient is also pending to have right hip surgery to reattach some torn tendons in the hip area.  She is concerned about being able to tolerate being in her back for a prolonged period time with all these pains that she is having.  The patient is not willing to take any pain medication due to the fact that her daughter died from a drug overdose.  Post-Procedure Evaluation  Procedure (10/26/2020): Diagnostic bilateral cervical facet block #1 under fluoroscopic guidance, no sedation Pre-procedure pain level: 5/10 Post-procedure: 2/10 (> 50% relief)  Sedation: None.  Effectiveness during initial  hour after procedure(Ultra-Short Term Relief): 75 %.  Upon evaluating this myself, the patient has confirmed that she attained 100% relief of her cervicogenic headache, cervicalgia, and upper back and shoulder pain between the neck and the shoulder proper.  Local anesthetic used: Long-acting (4-6 hours) Effectiveness: Defined as any analgesic benefit obtained secondary to the administration of local anesthetics. This carries significant diagnostic value as to the etiological  location, or anatomical origin, of the pain. Duration of benefit is expected to coincide with the duration of the local anesthetic used.  Effectiveness during initial 4-6 hours after procedure(Short-Term Relief): 50 %.  Upon personally evaluating these results, the patient has confirmed that she continue to have 100% relief of the pain of the above-mentioned structures during the initial 4 to 6 hours.  Long-term benefit: Defined as any relief past the pharmacologic duration of the local anesthetics.  Effectiveness past the initial 6 hours after procedure(Long-Term Relief): 0 %.  Current benefits: Defined as benefit that persist at this time.   Analgesia:  The patient indicates that the cervicalgia and the cervicogenic headaches do seem to have improved as well as the range of motion of her cervical spine however, she still having quite a bit of pain in the upper back region. Function: Ms. Willig reports improvement in function ROM: Ms. Ashworth reports improvement in ROM  Pharmacotherapy Assessment   Analgesic: No opioid analgesics prescribed by our practice. Highest recorded MME/day: 75 mg/day MME/day: 0 mg/day   Monitoring: [place] PMP: PDMP reviewed during this encounter.       Pharmacotherapy: No side-effects or adverse reactions reported. Compliance: No problems identified. Effectiveness: Clinically acceptable.  Ignatius Specking, RN  11/08/2020  8:23 AM  Sign when Signing Visit Safety precautions to be maintained throughout the outpatient stay will include: orient to surroundings, keep bed in low position, maintain call bell within reach at all times, provide assistance with transfer out of bed and ambulation.     UDS: No results found for: SUMMARY   ROS  Constitutional: Denies any fever or chills Gastrointestinal: No reported hemesis, hematochezia, vomiting, or acute GI distress Musculoskeletal: Denies any acute onset joint swelling, redness, loss of ROM, or weakness Neurological: No  reported episodes of acute onset apraxia, aphasia, dysarthria, agnosia, amnesia, paralysis, loss of coordination, or loss of consciousness  Medication Review  Cholecalciferol, Multi-Vitamins, Teriparatide (Recombinant), atorvastatin, ergocalciferol, fluticasone, gabapentin, levothyroxine, lisinopril, metoprolol succinate, metroNIDAZOLE, and zolpidem  History Review  Allergy: Ms. Neaves is allergic to sulfa antibiotics, nitrofuran derivatives, amlodipine besylate, and nitrofurantoin. Drug: Ms. Maka  reports no history of drug use. Alcohol:  reports previous alcohol use of about 7.0 standard drinks of alcohol per week. Tobacco:  reports that she has never smoked. She has never used smokeless tobacco. Social: Ms. Craw  reports that she has never smoked. She has never used smokeless tobacco. She reports previous alcohol use of about 7.0 standard drinks of alcohol per week. She reports that she does not use drugs. Medical:  has a past medical history of Arthritis, Breast cancer ([place]) (2009), Chicken pox, Chronic UTI, Diverticular disease, Esophagitis, Hormone disorder, Hyperlipidemia, Hypertension, Personal history of radiation therapy (2009), and Thyroid disease. Surgical: Ms. Messner  has a past surgical history that includes Total shoulder replacement; Shoulder surgery; Tonsillectomy; Bone graft hip iliac crest; Spine surgery (10/23/2017); Ventral hernia repair (N/A, 05/29/2018); Insertion of mesh (N/A, 05/29/2018); Eye surgery; Cardiac catheterization; Breast lumpectomy (Right, 2009); Breast biopsy (Left, 2009); Breast biopsy (Right, 2009); Colonoscopy with propofol (N/A, 05/15/2020); and  Spinal fusion. Family: family history includes AAA (abdominal aortic aneurysm) in her father; Arthritis in her father, mother, sister, and sister; Cancer in her brother and father; Coronary artery disease (age of onset: 68) in her mother; Diabetes in her father; Drug abuse in her daughter; Heart disease (age of onset: 6) in  her mother; Hyperlipidemia in her mother, sister, and sister; Hypertension in her mother and sister; Mental retardation in her brother.  Laboratory Chemistry Profile   Renal Lab Results  Component Value Date   BUN 24 (H) 05/08/2020   CREATININE 0.54 05/08/2020   BCR 28 (H) 09/10/2019   GFR 63.81 09/22/2018   GFRAA >60 05/08/2020   GFRNONAA >60 05/08/2020     Hepatic Lab Results  Component Value Date   AST 19 05/08/2020   ALT 15 05/08/2020   ALBUMIN 4.4 05/08/2020   ALKPHOS 97 05/08/2020   LIPASE 23 05/28/2018     Electrolytes Lab Results  Component Value Date   NA 138 05/08/2020   K 4.0 05/08/2020   CL 102 05/08/2020   CALCIUM 9.5 05/08/2020   MG 2.0 05/08/2020     Bone Lab Results  Component Value Date   VD25OH 19.57 (L) 05/08/2020     Inflammation (CRP: Acute Phase) (ESR: Chronic Phase) Lab Results  Component Value Date   CRP 0.6 05/08/2020   ESRSEDRATE 5 05/08/2020       Note: Above Lab results reviewed.  Recent Imaging Review  MR HIP LEFT WO CONTRAST CLINICAL DATA:  Left hip and buttock pain for 2 months. Right gluteus minimus tendon tear  EXAM: MR OF THE LEFT HIP WITHOUT CONTRAST  TECHNIQUE: Multiplanar, multisequence MR imaging was performed. No intravenous contrast was administered.  COMPARISON:  MRI 09/26/2020  FINDINGS: Bones: No acute fracture. No dislocation. No femoral head avascular necrosis. Status post lumbosacral fusion with fusion screws traversing the bilateral SI joints. Advanced arthropathy of the pubic symphysis. No marrow replacing lesion.  Articular cartilage and labrum  Articular cartilage: Mild-moderate chondral thinning and surface irregularity of the left hip joint. There are degenerative subchondral marrow signal changes within the acetabulum. Findings are fairly symmetric to the contralateral right hip.  Labrum: Extensive degenerative tearing of the labrum of the left hip. Multilobulated cystic structure along  the anterior inferior aspect of the left acetabulum measuring up to 2.9 x 1.2 cm, likely paralabral cyst (series 5, image 8; series 4, image 14). Visualized labrum of the right hip appears extensively degenerated.  Joint or bursal effusion  Joint effusion:  No joint effusion.  Bursae: Large right peritrochanteric bursal fluid collection measuring 7.3 x 2.5 x 3.0 cm (series 3, image 19). No left peritrochanteric bursal fluid.  Muscles and tendons  Muscles and tendons: There is a new full-thickness tear of the right gluteus medius tendon with approximately 1.3 cm of retraction. 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 Left gluteal tendons are intact. Tendinosis of the bilateral hamstring tendon origins with partial-thickness tear on the left (series 3, image 19). Chronic atrophy and fatty infiltration of the right gluteus medius and minimus muscles. Left gluteus minimus muscle is mildly atrophic.  Other findings  Miscellaneous:   Colonic diverticulosis.  IMPRESSION: 1. No acute osseous abnormality of the left hip. 2. 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 3. New full-thickness tear of the RIGHT gluteus medius tendon with 1.3 cm of retraction. 4. Large RIGHT peritrochanteric bursal fluid collection. 5. Tendinosis of the bilateral hamstring tendon origins with partial-thickness tear on the left.  Electronically  Signed   By: Davina Poke D.O.   On: 11/08/2020 08:33 Note: Reviewed        Physical Exam  General appearance: Well nourished, well developed, and well hydrated. In no apparent acute distress Mental status: Alert, oriented x 3 (person, place, & time)       Respiratory: No evidence of acute respiratory distress Eyes: PERLA Vitals: BP (!) 145/86   Pulse 85   Temp (!) 97 F (36.1 C)   Resp 16   Ht 4' 11" (1.499 m)   Wt 127 lb (57.6 kg)   SpO2 99%   BMI 25.65 kg/m  BMI: Estimated body mass index is 25.65 kg/m as calculated from  the following:   Height as of this encounter: 4' 11" (1.499 m).   Weight as of this encounter: 127 lb (57.6 kg). Ideal: Patient must be at least 60 in tall to calculate ideal body weight  Assessment   Status Diagnosis  Controlled Controlled Controlled 1. Chronic pain syndrome   2. Cervicalgia   3. Cervical facet syndrome (Bilateral)   4. Cervicogenic headache (Bilateral)   5. Chronic shoulder pain (Bilateral) (R>L)   6. Chronic upper extremity pain (Right)   7. Thoracic facet syndrome (Bilateral)   8. Scoliosis of thoracic spine, unspecified scoliosis type   9. 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 h/o esophagitis noted 05/17/14 EGD   Iron Deficiency (Resolved)    Plan of Care  Problem-specific:  No problem-specific Assessment & Plan notes found for this encounter.  Ms. Marybeth Dandy has a current medication list which includes the following long-term medication(s): atorvastatin, fluticasone, levothyroxine, lisinopril, metoprolol succinate, and zolpidem.  Pharmacotherapy (Medications Ordered): No orders of the defined types were placed in this encounter.  Orders:  Orders Placed This Encounter  Procedures  . THORACIC FACET BLOCK    Standing Status:   Future    Standing Expiration Date:   12/08/2020    Scheduling Instructions:     Thoracic Medial Branch Block (Upper) T1-T5     Side: Right-sided     Sedation: Patient's choice.     Timeframe: ASAA    Order Specific Question:   Where will this procedure be performed?    Answer:   [facility]   Follow-up  plan:   Return for Procedure (no sedation): (R) T1-5 T-FCT BLK #1.      Interventional treatment options: Planned, scheduled, and/or pending:   Diagnostic right T9-10 thoracic ESI #1    Under consideration:   Diagnostic right T9-10 thoracic ESI #1  Diagnostic bilateral T9, T10, T11, and T12 MMB thoracic facet RFA (DIFICULT CANDIDATE DUE TO HARDWARE)    Therapeutic/palliative (PRN):   Diagnostic bilateral T9, T10, T11, and T12 MMB thoracic facet block #3      Recent Visits Date Type Provider Dept  10/26/20 Procedure visit Milinda Pointer, MD [facility]  10/23/20 Office Visit Milinda Pointer, MD [facility]  10/05/20 Procedure visit Milinda Pointer, MD [facility]  10/02/20 Office Visit Milinda Pointer, MD [facility]  09/14/20 Procedure visit Milinda Pointer, [facility]  09/13/20  Office Visit Milinda Pointer, MD [facility]  Showing recent visits within past 90 days and meeting all other requirements Today's Visits Date Type Provider Dept  11/08/20 Office Visit Milinda Pointer, MD [facility]  Showing today's visits and meeting all other requirements Future Appointments Date Type Provider Dept  11/09/20 Appointment Milinda Pointer, MD [facility]  Showing future appointments within next 90 days and meeting all other requirements  I discussed the assessment and treatment plan with the patient. The patient was provided an opportunity to ask questions and all were answered. The patient agreed with the plan and demonstrated an understanding of the instructions.  Patient advised to call back or seek an in-person evaluation if the symptoms or condition worsens.  Duration of encounter: >62 minutes.  Note by: Gaspar Cola, MD Date: 11/08/2020; Time: 10:50 AM

## 2020-11-06 DIAGNOSIS — M1712 Unilateral primary osteoarthritis, left knee: Secondary | ICD-10-CM | POA: Diagnosis not present

## 2020-11-07 ENCOUNTER — Ambulatory Visit
Admission: RE | Admit: 2020-11-07 | Discharge: 2020-11-07 | Disposition: A | Discharge status: Home / Self Care | Payer: Medicare Other | Source: Ambulatory Visit | Attending: Surgery | Admitting: Surgery

## 2020-11-07 ENCOUNTER — Other Ambulatory Visit: Payer: Self-pay

## 2020-11-07 DIAGNOSIS — S73192A Other sprain of left hip, initial encounter: Secondary | ICD-10-CM | POA: Diagnosis not present

## 2020-11-07 DIAGNOSIS — M1612 Unilateral primary osteoarthritis, left hip: Secondary | ICD-10-CM | POA: Diagnosis not present

## 2020-11-07 DIAGNOSIS — S76312A Strain of muscle, fascia and tendon of the posterior muscle group at thigh level, left thigh, initial encounter: Secondary | ICD-10-CM | POA: Diagnosis not present

## 2020-11-07 DIAGNOSIS — M25559 Pain in unspecified hip: Secondary | ICD-10-CM | POA: Diagnosis not present

## 2020-11-07 DIAGNOSIS — M6258 Muscle wasting and atrophy, not elsewhere classified, other site: Secondary | ICD-10-CM | POA: Diagnosis not present

## 2020-11-07 IMAGING — MR MR HIP*L* W/O CM
4 of 5 series · 26 of 40 positions shown · non-contrast
Comparison: MRI [DATE]

CLINICAL DATA: Left hip and buttock pain for 2 months. Right
gluteus minimus tendon tear

EXAM:
MR OF THE LEFT HIP WITHOUT CONTRAST
TECHNIQUE: Multiplanar, multisequence MR imaging was performed. No intravenous
contrast was administered.

[Series 2: T1 · coronal · left · 4.0mm · 0.59mm/px · 5 of 38 slices shown]
[im 1/38]
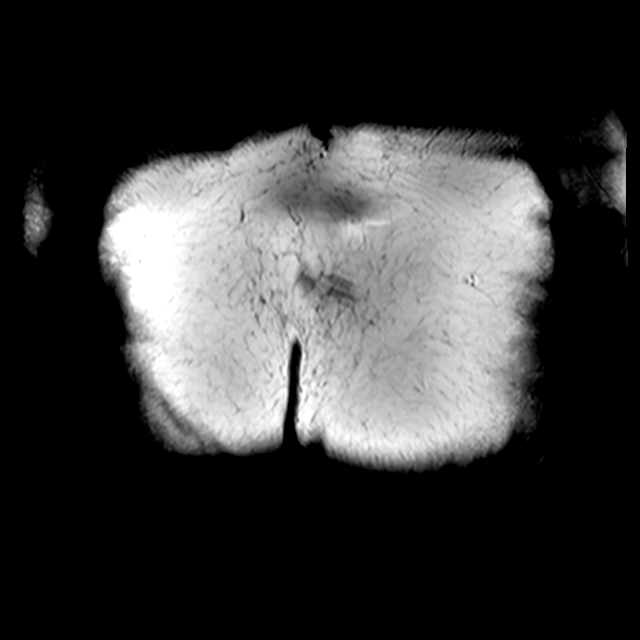
[im 5/38]
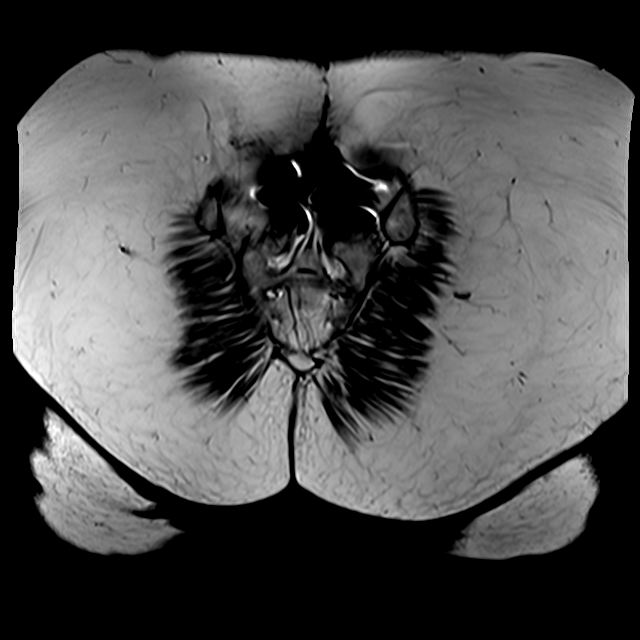
[im 13/38]
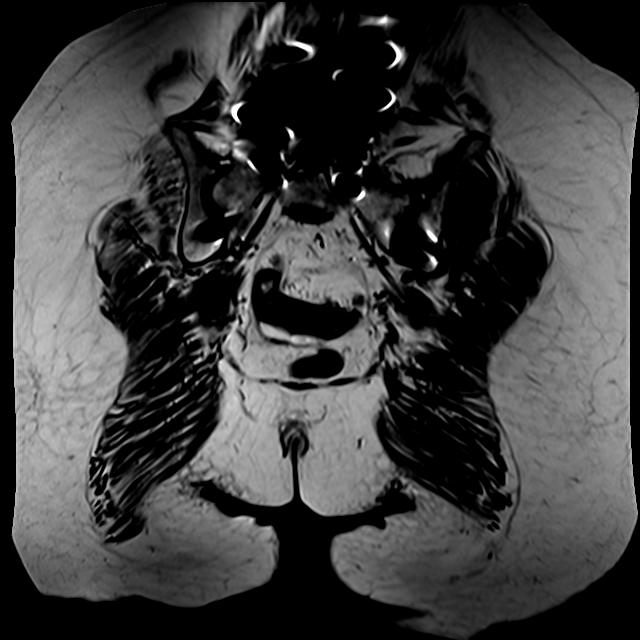
[im 21/38]
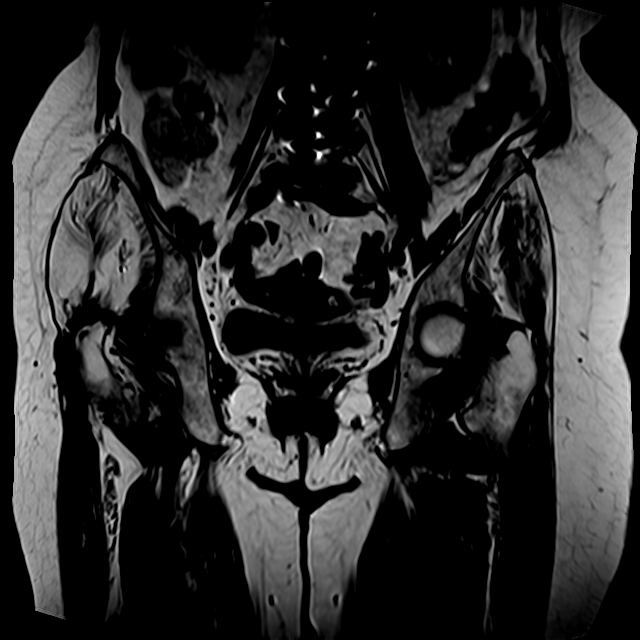
[im 33/38]
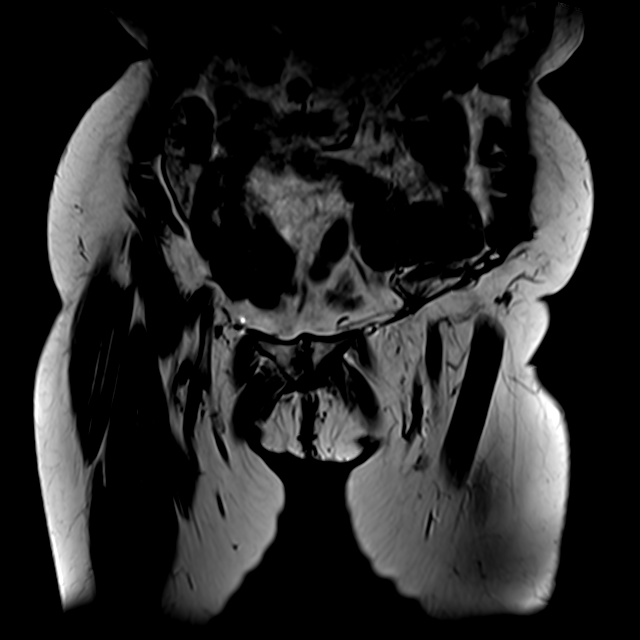

[Series 4: T2 fat-sat · axial · left · 4.0mm · 0.70mm/px · z∈[-119,+26]mm · 7 of 30 slices shown]
[im 1/30]
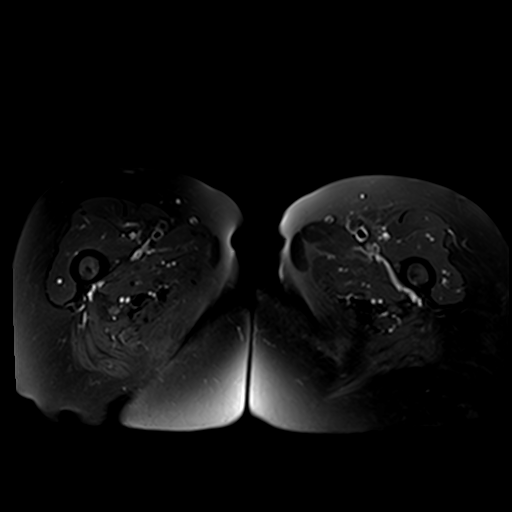
[im 5/30]
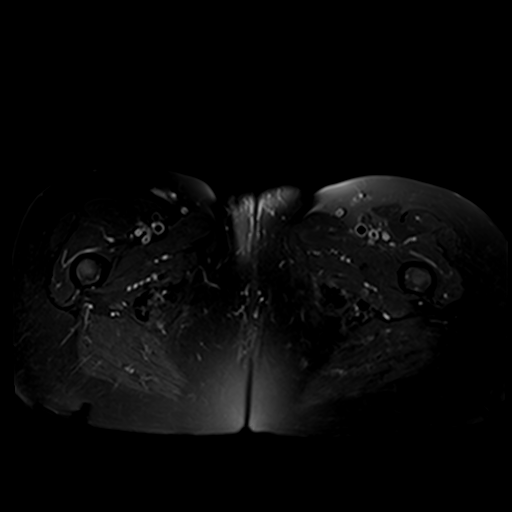
[im 10/30]
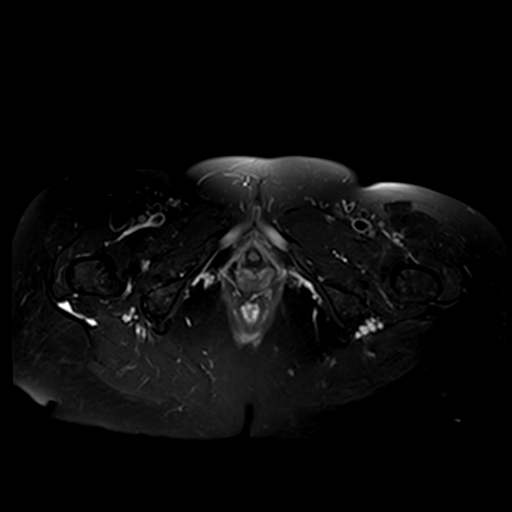
[im 15/30]
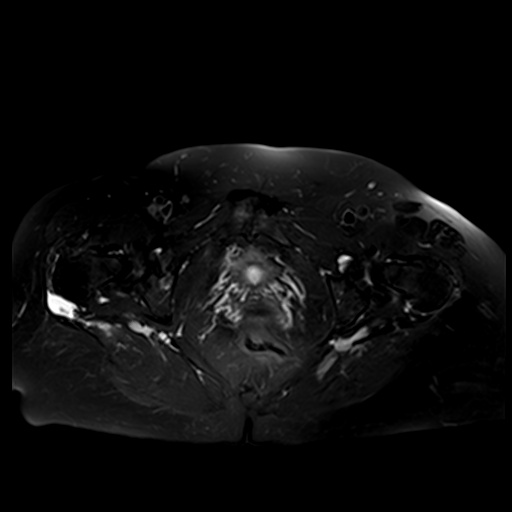
[im 20/30]
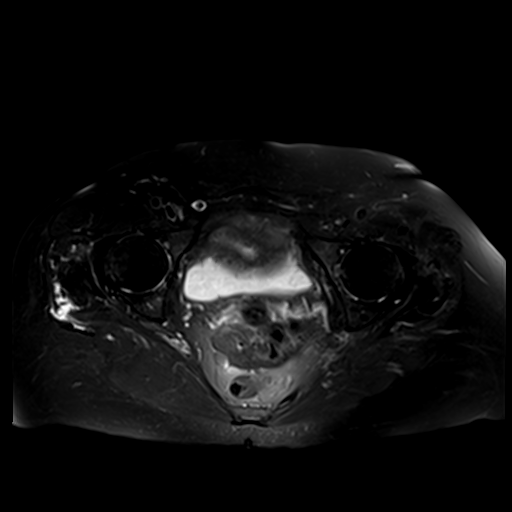
[im 25/30]
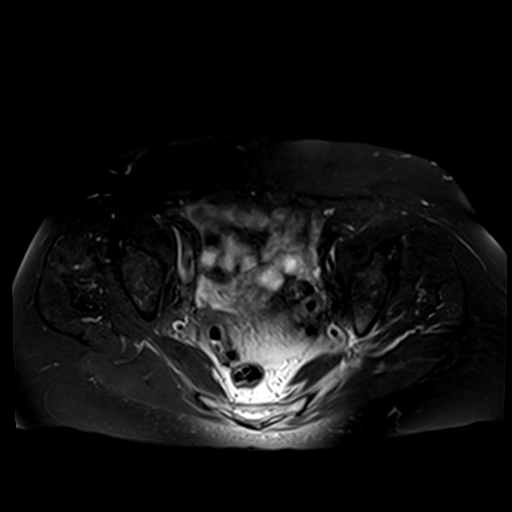
[im 30/30]
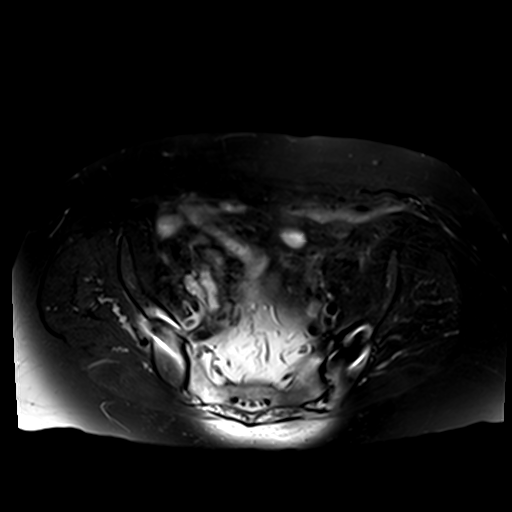

[Series 5: PD fat-sat · sagittal · left · 4.0mm · 0.70mm/px · 7 of 29 slices shown (1 of 2)]
[im 1/29]
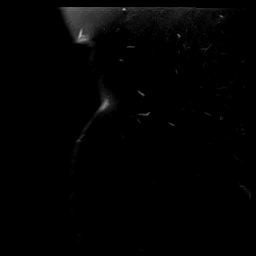
[im 5/29]
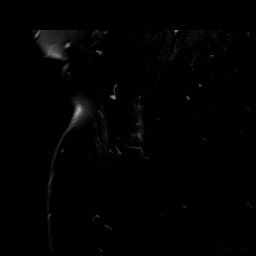
[im 10/29]
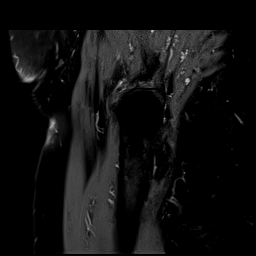
[im 15/29]
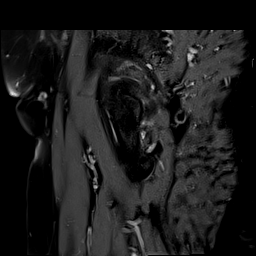
[im 19/29]
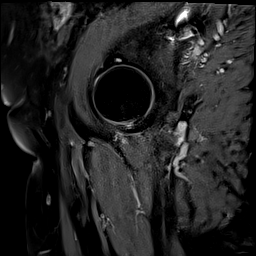
[im 24/29]
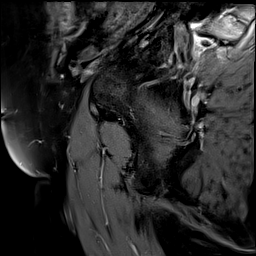
[im 29/29]
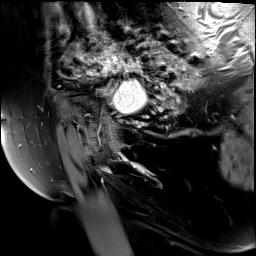

[Series 6: PD fat-sat · coronal · left · 4.0mm · 0.70mm/px · 7 of 29 slices shown (2 of 2)]
[im 1/29]
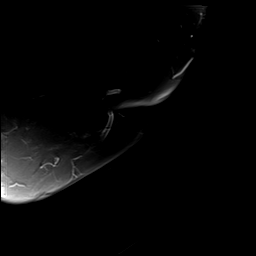
[im 5/29]
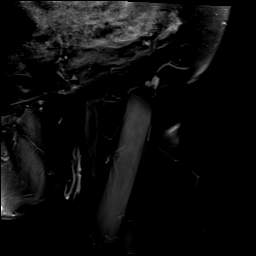
[im 10/29]
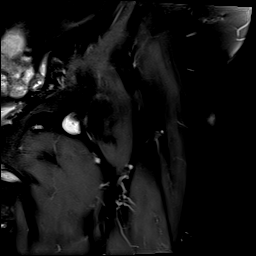
[im 15/29]
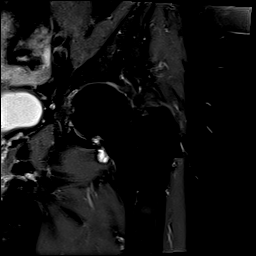
[im 19/29]
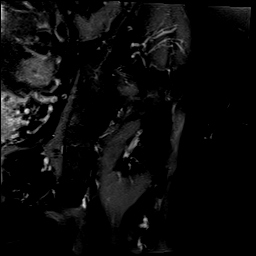
[im 24/29]
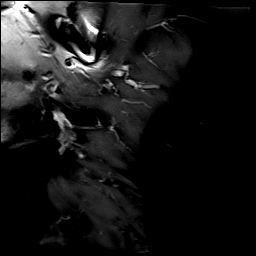
[im 29/29]
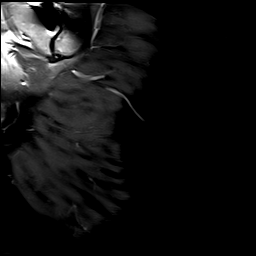

[26 of 40 positions shown; findings below may reference images not displayed]

FINDINGS: Bones: No acute fracture. No dislocation. No femoral head avascular
necrosis. Status post lumbosacral fusion with fusion screws
traversing the bilateral SI joints. Advanced arthropathy of the
pubic symphysis. No marrow replacing lesion.

Articular cartilage and labrum

Articular cartilage: Mild-moderate chondral thinning and surface
irregularity of the left hip joint. There are degenerative
subchondral marrow signal changes within the acetabulum. Findings
are fairly symmetric to the contralateral right hip.

Labrum: Extensive degenerative tearing of the labrum of the left
hip. Multilobulated cystic structure along the anterior inferior
aspect of the left acetabulum measuring up to 2.9 x 1.2 cm, likely
paralabral cyst (series 5, image 8; series 4, image 14). Visualized
labrum of the right hip appears extensively degenerated.

Joint or bursal effusion

Joint effusion:  No joint effusion.

Bursae: Large right peritrochanteric bursal fluid collection
measuring 7.3 x 2.5 x 3.0 cm (series 3, image 19). No left
peritrochanteric bursal fluid.

Muscles and tendons

Muscles and tendons: There is a new full-thickness tear of the right
gluteus medius tendon with approximately 1.3 cm of retraction.
Chronic retracted tear of the right gluteus minimus tendon. Left
gluteal tendons are intact. Tendinosis of the bilateral hamstring
tendon origins with partial-thickness tear on the left (series 3,
image 19). Chronic atrophy and fatty infiltration of the right
gluteus medius and minimus muscles. Left gluteus minimus muscle is
mildly atrophic.

Other findings

Miscellaneous:   Colonic diverticulosis.
IMPRESSION: 1. No acute osseous abnormality of the left hip.
2. Mild-to-moderate bilateral hip osteoarthritis.
3. New full-thickness tear of the RIGHT gluteus medius tendon with
1.3 cm of retraction.
4. Large RIGHT peritrochanteric bursal fluid collection.
5. Tendinosis of the bilateral hamstring tendon origins with
partial-thickness tear on the left.

## 2020-11-08 ENCOUNTER — Ambulatory Visit: Payer: Medicare Other | Attending: Pain Medicine | Admitting: Pain Medicine

## 2020-11-08 ENCOUNTER — Encounter: Payer: Self-pay | Admitting: Pain Medicine

## 2020-11-08 VITALS — BP 145/86 | HR 85 | Temp 97.0°F | Resp 16 | Ht 59.0 in | Wt 127.0 lb

## 2020-11-08 DIAGNOSIS — M810 Age-related osteoporosis without current pathological fracture: Secondary | ICD-10-CM | POA: Diagnosis not present

## 2020-11-08 DIAGNOSIS — M25511 Pain in right shoulder: Secondary | ICD-10-CM | POA: Diagnosis not present

## 2020-11-08 DIAGNOSIS — M47894 Other spondylosis, thoracic region: Secondary | ICD-10-CM

## 2020-11-08 DIAGNOSIS — M419 Scoliosis, unspecified: Secondary | ICD-10-CM

## 2020-11-08 DIAGNOSIS — M542 Cervicalgia: Secondary | ICD-10-CM | POA: Diagnosis not present

## 2020-11-08 DIAGNOSIS — M79601 Pain in right arm: Secondary | ICD-10-CM | POA: Diagnosis not present

## 2020-11-08 DIAGNOSIS — G8929 Other chronic pain: Secondary | ICD-10-CM

## 2020-11-08 DIAGNOSIS — M47812 Spondylosis without myelopathy or radiculopathy, cervical region: Secondary | ICD-10-CM

## 2020-11-08 DIAGNOSIS — M25512 Pain in left shoulder: Secondary | ICD-10-CM | POA: Diagnosis not present

## 2020-11-08 DIAGNOSIS — G894 Chronic pain syndrome: Secondary | ICD-10-CM

## 2020-11-08 DIAGNOSIS — G4486 Cervicogenic headache: Secondary | ICD-10-CM

## 2020-11-08 NOTE — Patient Instructions (Addendum)
____________________________________________________________________________________________  Preparing for your procedure (without sedation)  Procedure appointments are limited to planned procedures: . No Prescription Refills. . No disability issues will be discussed. . No medication changes will be discussed.  Instructions: . Oral Intake: Do not eat or drink anything for at least 6 hours prior to your procedure. (Exception: Blood Pressure Medication. See below.) . Transportation: Unless otherwise stated by your physician, you may drive yourself after the procedure. . Blood Pressure Medicine: Do not forget to take your blood pressure medicine with a sip of water the morning of the procedure. If your Diastolic (lower reading)is above 100 mmHg, elective cases will be cancelled/rescheduled. . Blood thinners: These will need to be stopped for procedures. Notify our staff if you are taking any blood thinners. Depending on which one you take, there will be specific instructions on how and when to stop it. . Diabetics on insulin: Notify the staff so that you can be scheduled 1st case in the morning. If your diabetes requires high dose insulin, take only  of your normal insulin dose the morning of the procedure and notify the staff that you have done so. . Preventing infections: Shower with an antibacterial soap the morning of your procedure.  . Build-up your immune system: Take 1000 mg of Vitamin C with every meal (3 times a day) the day prior to your procedure. . Antibiotics: Inform the staff if you have a condition or reason that requires you to take antibiotics before dental procedures. . Pregnancy: If you are pregnant, call and cancel the procedure. . Sickness: If you have a cold, fever, or any active infections, call and cancel the procedure. . Arrival: You must be in the facility at least 30 minutes prior to your scheduled procedure. . Children: Do not bring any children with you. . Dress  appropriately: Bring dark clothing that you would not mind if they get stained. . Valuables: Do not bring any jewelry or valuables.  Reasons to call and reschedule or cancel your procedure: (Following these recommendations will minimize the risk of a serious complication.) . Surgeries: Avoid having procedures within 2 weeks of any surgery. (Avoid for 2 weeks before or after any surgery). . Flu Shots: Avoid having procedures within 2 weeks of a flu shots or . (Avoid for 2 weeks before or after immunizations). . Barium: Avoid having a procedure within 7-10 days after having had a radiological study involving the use of radiological contrast. (Myelograms, Barium swallow or enema study). . Heart attacks: Avoid any elective procedures or surgeries for the initial 6 months after a "Myocardial Infarction" (Heart Attack). . Blood thinners: It is imperative that you stop these medications before procedures. Let us know if you if you take any blood thinner.  . Infection: Avoid procedures during or within two weeks of an infection (including chest colds or gastrointestinal problems). Symptoms associated with infections include: Localized redness, fever, chills, night sweats or profuse sweating, burning sensation when voiding, cough, congestion, stuffiness, runny nose, sore throat, diarrhea, nausea, vomiting, cold or Flu symptoms, recent or current infections. It is specially important if the infection is over the area that we intend to treat. . Heart and lung problems: Symptoms that may suggest an active cardiopulmonary problem include: cough, chest pain, breathing difficulties or shortness of breath, dizziness, ankle swelling, uncontrolled high or unusually low blood pressure, and/or palpitations. If you are experiencing any of these symptoms, cancel your procedure and contact your primary care physician for an evaluation.  Remember:  Regular   Business hours are:  Monday to Thursday 8:00 AM to 4:00  PM  Provider's Schedule: Milinda Pointer, MD:  Procedure days: Tuesday and Thursday 7:30 AM to 4:00 PM  Gillis Santa, MD:  Procedure days: Monday and Wednesday 7:30 AM to 4:00 PM ____________________________________________________________________________________________    GENERAL RISKS AND COMPLICATIONS  What are the risk, side effects and possible complications? Generally speaking, most procedures are safe.  However, with any procedure there are risks, side effects, and the possibility of complications.  The risks and complications are dependent upon the sites that are lesioned, or the type of nerve block to be performed.  The closer the procedure is to the spine, the more serious the risks are.  Great care is taken when placing the radio frequency needles, block needles or lesioning probes, but sometimes complications can occur. 1. Infection: Any time there is an injection through the skin, there is a risk of infection.  This is why sterile conditions are used for these blocks.  There are four possible types of infection. 1. Localized skin infection. 2. Central Nervous System Infection-This can be in the form of Meningitis, which can be deadly. 3. Epidural Infections-This can be in the form of an epidural abscess, which can cause pressure inside of the spine, causing compression of the spinal cord with subsequent paralysis. This would require an emergency surgery to decompress, and there are no guarantees that the patient would recover from the paralysis. 4. Discitis-This is an infection of the intervertebral discs.  It occurs in about 1% of discography procedures.  It is difficult to treat and it may lead to surgery.        2. Pain: the needles have to go through skin and soft tissues, will cause soreness.       3. Damage to internal structures:  The nerves to be lesioned may be near blood vessels or    other nerves which can be potentially damaged.       4. Bleeding: Bleeding is more  common if the patient is taking blood thinners such as  aspirin, Coumadin, Ticiid, Plavix, etc., or if he/she have some genetic predisposition  such as hemophilia. Bleeding into the spinal canal can cause compression of the spinal  cord with subsequent paralysis.  This would require an emergency surgery to  decompress and there are no guarantees that the patient would recover from the  paralysis.       5. Pneumothorax:  Puncturing of a lung is a possibility, every time a needle is introduced in  the area of the chest or upper back.  Pneumothorax refers to free air around the  collapsed lung(s), inside of the thoracic cavity (chest cavity).  Another two possible  complications related to a similar event would include: Hemothorax and Chylothorax.   These are variations of the Pneumothorax, where instead of air around the collapsed  lung(s), you may have blood or chyle, respectively.       6. Spinal headaches: They may occur with any procedures in the area of the spine.       7. Persistent CSF (Cerebro-Spinal Fluid) leakage: This is a rare problem, but may occur  with prolonged intrathecal or epidural catheters either due to the formation of a fistulous  track or a dural tear.       8. Nerve damage: By working so close to the spinal cord, there is always a possibility of  nerve damage, which could be as serious as a permanent spinal cord  injury with  paralysis.       9. Death:  Although rare, severe deadly allergic reactions known as "Anaphylactic  reaction" can occur to any of the medications used.      10. Worsening of the symptoms:  We can always make thing worse.  What are the chances of something like this happening? Chances of any of this occuring are extremely low.  By statistics, you have more of a chance of getting killed in a motor vehicle accident: while driving to the hospital than any of the above occurring .  Nevertheless, you should be aware that they are possibilities.  In general, it is  similar to taking a shower.  Everybody knows that you can slip, hit your head and get killed.  Does that mean that you should not shower again?  Nevertheless always keep in mind that statistics do not mean anything if you happen to be on the wrong side of them.  Even if a procedure has a 1 (one) in a 1,000,000 (million) chance of going wrong, it you happen to be that one..Also, keep in mind that by statistics, you have more of a chance of having something go wrong when taking medications.  Who should not have this procedure? If you are on a blood thinning medication (e.g. Coumadin, Plavix, see list of "Blood Thinners"), or if you have an active infection going on, you should not have the procedure.  If you are taking any blood thinners, please inform your physician.  How should I prepare for this procedure?  Do not eat or drink anything at least six hours prior to the procedure.  Bring a driver with you .  It cannot be a taxi.  Come accompanied by an adult that can drive you back, and that is strong enough to help you if your legs get weak or numb from the local anesthetic.  Take all of your medicines the morning of the procedure with just enough water to swallow them.  If you have diabetes, make sure that you are scheduled to have your procedure done first thing in the morning, whenever possible.  If you have diabetes, take only half of your insulin dose and notify our nurse that you have done so as soon as you arrive at the clinic.  If you are diabetic, but only take blood sugar pills (oral hypoglycemic), then do not take them on the morning of your procedure.  You may take them after you have had the procedure.  Do not take aspirin or any aspirin-containing medications, at least eleven (11) days prior to the procedure.  They may prolong bleeding.  Wear loose fitting clothing that may be easy to take off and that you would not mind if it got stained with Betadine or blood.  Do not wear any  jewelry or perfume  Remove any nail coloring.  It will interfere with some of our monitoring equipment.  NOTE: Remember that this is not meant to be interpreted as a complete list of all possible complications.  Unforeseen problems may occur.  BLOOD THINNERS The following drugs contain aspirin or other products, which can cause increased bleeding during surgery and should not be taken for 2 weeks prior to and 1 week after surgery.  If you should need take something for relief of minor pain, you may take acetaminophen which is found in Tylenol,m Datril, Anacin-3 and Panadol. It is not blood thinner. The products listed below are.  Do not take any of  the products listed below in addition to any listed on your instruction sheet.  A.P.C or A.P.C with Codeine Codeine Phosphate Capsules #3 Ibuprofen Ridaura  ABC compound Congesprin Imuran rimadil  Advil Cope Indocin Robaxisal  Alka-Seltzer Effervescent Pain Reliever and Antacid Coricidin or Coricidin-D  Indomethacin Rufen  Alka-Seltzer plus Cold Medicine Cosprin Ketoprofen S-A-C Tablets  Anacin Analgesic Tablets or Capsules Coumadin Korlgesic Salflex  Anacin Extra Strength Analgesic tablets or capsules CP-2 Tablets Lanoril Salicylate  Anaprox Cuprimine Capsules Levenox Salocol  Anexsia-D Dalteparin Magan Salsalate  Anodynos Darvon compound Magnesium Salicylate Sine-off  Ansaid Dasin Capsules Magsal Sodium Salicylate  Anturane Depen Capsules Marnal Soma  APF Arthritis pain formula Dewitt's Pills Measurin Stanback  Argesic Dia-Gesic Meclofenamic Sulfinpyrazone  Arthritis Bayer Timed Release Aspirin Diclofenac Meclomen Sulindac  Arthritis pain formula Anacin Dicumarol Medipren Supac  Analgesic (Safety coated) Arthralgen Diffunasal Mefanamic Suprofen  Arthritis Strength Bufferin Dihydrocodeine Mepro Compound Suprol  Arthropan liquid Dopirydamole Methcarbomol with Aspirin Synalgos  ASA tablets/Enseals Disalcid Micrainin Tagament  Ascriptin Doan's  Midol Talwin  Ascriptin A/D Dolene Mobidin Tanderil  Ascriptin Extra Strength Dolobid Moblgesic Ticlid  Ascriptin with Codeine Doloprin or Doloprin with Codeine Momentum Tolectin  Asperbuf Duoprin Mono-gesic Trendar  Aspergum Duradyne Motrin or Motrin IB Triminicin  Aspirin plain, buffered or enteric coated Durasal Myochrisine Trigesic  Aspirin Suppositories Easprin Nalfon Trillsate  Aspirin with Codeine Ecotrin Regular or Extra Strength Naprosyn Uracel  Atromid-S Efficin Naproxen Ursinus  Auranofin Capsules Elmiron Neocylate Vanquish  Axotal Emagrin Norgesic Verin  Azathioprine Empirin or Empirin with Codeine Normiflo Vitamin E  Azolid Emprazil Nuprin Voltaren  Bayer Aspirin plain, buffered or children's or timed BC Tablets or powders Encaprin Orgaran Warfarin Sodium  Buff-a-Comp Enoxaparin Orudis Zorpin  Buff-a-Comp with Codeine Equegesic Os-Cal-Gesic   Buffaprin Excedrin plain, buffered or Extra Strength Oxalid   Bufferin Arthritis Strength Feldene Oxphenbutazone   Bufferin plain or Extra Strength Feldene Capsules Oxycodone with Aspirin   Bufferin with Codeine Fenoprofen Fenoprofen Pabalate or Pabalate-SF   Buffets II Flogesic Panagesic   Buffinol plain or Extra Strength Florinal or Florinal with Codeine Panwarfarin   Buf-Tabs Flurbiprofen Penicillamine   Butalbital Compound Four-way cold tablets Penicillin   Butazolidin Fragmin Pepto-Bismol   Carbenicillin Geminisyn Percodan   Carna Arthritis Reliever Geopen Persantine   Carprofen Gold's salt Persistin   Chloramphenicol Goody's Phenylbutazone   Chloromycetin Haltrain Piroxlcam   Clmetidine heparin Plaquenil   Cllnoril Hyco-pap Ponstel   Clofibrate Hydroxy chloroquine Propoxyphen         Before stopping any of these medications, be sure to consult the physician who ordered them.  Some, such as Coumadin (Warfarin) are ordered to prevent or treat serious conditions such as "deep thrombosis", "pumonary embolisms", and other heart  problems.  The amount of time that you may need off of the medication may also vary with the medication and the reason for which you were taking it.  If you are taking any of these medications, please make sure you notify your pain physician before you undergo any procedures.         Facet Blocks Patient Information  Description: The facets are joints in the spine between the vertebrae.  Like any joints in the body, facets can become irritated and painful.  Arthritis can also effect the facets.  By injecting steroids and local anesthetic in and around these joints, we can temporarily block the nerve supply to them.  Steroids act directly on irritated nerves and tissues to reduce selling and inflammation which often leads  to decreased pain.  Facet blocks may be done anywhere along the spine from the neck to the low back depending upon the location of your pain.   After numbing the skin with local anesthetic (like Novocaine), a small needle is passed onto the facet joints under x-ray guidance.  You may experience a sensation of pressure while this is being done.  The entire block usually lasts about 15-25 minutes.   Conditions which may be treated by facet blocks:   Low back/buttock pain  Neck/shoulder pain  Certain types of headaches  Preparation for the injection:  1. Do not eat any solid food or dairy products within 8 hours of your appointment. 2. You may drink clear liquid up to 3 hours before appointment.  Clear liquids include water, black coffee, juice or soda.  No milk or cream please. 3. You may take your regular medication, including pain medications, with a sip of water before your appointment.  Diabetics should hold regular insulin (if taken separately) and take 1/2 normal NPH dose the morning of the procedure.  Carry some sugar containing items with you to your appointment. 4. A driver must accompany you and be prepared to drive you home after your procedure. 5. Bring all  your current medications with you. 6. An IV may be inserted and sedation may be given at the discretion of the physician. 7. A blood pressure cuff, EKG and other monitors will often be applied during the procedure.  Some patients may need to have extra oxygen administered for a short period. 8. You will be asked to provide medical information, including your allergies and medications, prior to the procedure.  We must know immediately if you are taking blood thinners (like Coumadin/Warfarin) or if you are allergic to IV iodine contrast (dye).  We must know if you could possible be pregnant.  Possible side-effects:   Bleeding from needle site  Infection (rare, may require surgery)  Nerve injury (rare)  Numbness & tingling (temporary)  Difficulty urinating (rare, temporary)  Spinal headache (a headache worse with upright posture)  Light-headedness (temporary)  Pain at injection site (serveral days)  Decreased blood pressure (rare, temporary)  Weakness in arm/leg (temporary)  Pressure sensation in back/neck (temporary)   Call if you experience:   Fever/chills associated with headache or increased back/neck pain  Headache worsened by an upright position  New onset, weakness or numbness of an extremity below the injection site  Hives or difficulty breathing (go to the emergency room)  Inflammation or drainage at the injection site(s)  Severe back/neck pain greater than usual  New symptoms which are concerning to you  Please note:  Although the local anesthetic injected can often make your back or neck feel good for several hours after the injection, the pain will likely return. It takes 3-7 days for steroids to work.  You may not notice any pain relief for at least one week.  If effective, we will often do a series of 2-3 injections spaced 3-6 weeks apart to maximally decrease your pain.  After the initial series, you may be a candidate for a more permanent nerve block  of the facets.  If you have any questions, please call #[phone]) [facility]

## 2020-11-08 NOTE — Progress Notes (Signed)
Safety precautions to be maintained throughout the outpatient stay will include: orient to surroundings, keep bed in low position, maintain call bell within reach at all times, provide assistance with transfer out of bed and ambulation.  

## 2020-11-09 ENCOUNTER — Encounter: Payer: Self-pay | Admitting: Pain Medicine

## 2020-11-09 ENCOUNTER — Other Ambulatory Visit: Payer: Self-pay

## 2020-11-09 ENCOUNTER — Ambulatory Visit: Admission: RE | Admit: 2020-11-09 | Payer: Medicare Other | Source: Ambulatory Visit

## 2020-11-09 ENCOUNTER — Ambulatory Visit: Payer: Medicare Other | Attending: Pain Medicine | Admitting: Pain Medicine

## 2020-11-09 VITALS — BP 151/76 | HR 98 | Temp 97.6°F | Resp 16 | Ht 59.0 in | Wt 127.0 lb

## 2020-11-09 DIAGNOSIS — M25511 Pain in right shoulder: Secondary | ICD-10-CM | POA: Diagnosis not present

## 2020-11-09 DIAGNOSIS — G8929 Other chronic pain: Secondary | ICD-10-CM | POA: Diagnosis not present

## 2020-11-09 DIAGNOSIS — M7551 Bursitis of right shoulder: Secondary | ICD-10-CM

## 2020-11-09 DIAGNOSIS — M79601 Pain in right arm: Secondary | ICD-10-CM | POA: Diagnosis not present

## 2020-11-09 DIAGNOSIS — M5134 Other intervertebral disc degeneration, thoracic region: Secondary | ICD-10-CM | POA: Diagnosis not present

## 2020-11-09 DIAGNOSIS — M898X1 Other specified disorders of bone, shoulder: Secondary | ICD-10-CM | POA: Diagnosis not present

## 2020-11-09 MED ORDER — DEXAMETHASONE SODIUM PHOSPHATE 10 MG/ML IJ SOLN
10.0000 mg | Freq: Once | INTRAMUSCULAR | Status: AC
  Administered 2020-11-09: 10 mg
  Filled 2020-11-09: qty 1

## 2020-11-09 MED ORDER — ROPIVACAINE HCL 2 MG/ML IJ SOLN
9.0000 mL | Freq: Once | INTRAMUSCULAR | Status: DC
  Filled 2020-11-09: qty 10

## 2020-11-09 MED ORDER — LIDOCAINE HCL 2 % IJ SOLN
20.0000 mL | Freq: Once | INTRAMUSCULAR | Status: DC
  Filled 2020-11-09: qty 40

## 2020-11-09 MED ORDER — METHYLPREDNISOLONE ACETATE 80 MG/ML IJ SUSP
80.0000 mg | Freq: Once | INTRAMUSCULAR | Status: AC
  Administered 2020-11-09: 80 mg
  Filled 2020-11-09: qty 1

## 2020-11-09 MED ORDER — ROPIVACAINE HCL 2 MG/ML IJ SOLN
2.0000 mL | Freq: Once | INTRAMUSCULAR | Status: AC
  Administered 2020-11-09: 2 mL via PERINEURAL

## 2020-11-09 MED ORDER — LIDOCAINE HCL 2 % IJ SOLN
2.0000 mL | Freq: Once | INTRAMUSCULAR | Status: AC
  Administered 2020-11-09: 40 mg

## 2020-11-09 NOTE — Progress Notes (Signed)
PROVIDER NOTE: Information contained herein reflects review and annotations entered in association with encounter. Interpretation of such information and data should be left to medically-trained personnel. Information provided to patient can be located elsewhere in the medical record under "Patient Instructions". Document created using STT-dictation technology, any transcriptional errors that may result from process are unintentional.    Patient: Maria Spencer  Service Category: Procedure  Provider: Gaspar Cola, MD  DOB: [birth year]  DOS: 11/09/2020  Location: [facility]  MRN: [id number]  Setting: Ambulatory - outpatient  Referring Provider: McLean-Scocuzza, Olivia Mackie *  Type: Established Patient  Specialty: Interventional Pain Management  PCP: McLean-Scocuzza, Nino Glow, MD   Primary Reason for Visit: Interventional Pain Management Treatment. CC: Neck Pain  Procedure:          Anesthesia, Analgesia, Anxiolysis:  Type: Trapezoid (Scapulotrapezial) bursa Trigger Point Injection (1-2 muscle groups) #1  CPT: 20552 Primary Purpose: Diagnostic/Therapeutic Region: Posterior scapular region Level: Cervico-thoracic Target Area: Trapezoid (Scapulotrapezial) bursa Approach: Percutaneous, ipsilateral approach. Laterality: Right scapular spine  Type: Local Anesthesia Indication(s): Analgesia         Local Anesthetic: Lidocaine 1-2% Route: Infiltration ([place]/IM) IV Access: Declined Sedation: Declined   Position: Sitting   Indications: 1. Chronic bursitis of shoulder area (Right)   2. Chronic shoulder pain (Right)   3. Shoulder blade pain (Right)   4. Chronic upper extremity pain (Right)    Pain Score: Pre-procedure: 4 /10 Post-procedure: 4 /10   The patient was originally scheduled to come in for a right-sided T1-T5 thoracic facet medial branch block under fluoroscopic guidance.  However, upon arrival and physical examination prior to the procedure, it was obvious that the  patient was experiencing allodynia and point tenderness over the right scapular spine.  Furthermore, evaluation of the area revealed atrophy of the trapezius muscle, likely to be secondary to the extensive fusion that she has from T5 all the way down into the lumbar region.  Reviewing the pain and the anatomy, he has come to my attention that perhaps this patient is suffering from a right trapezoid bursitis as a consequence of the muscle atrophy and direct contact of the bursa with external pressure forces such as chairs.  Because of this, the plan was changed to doing a bursa injection in the area.  Pre-op H&P Assessment:  Maria Spencer is a 74 y.o. (year old), female patient, seen today for interventional treatment. She  has a past surgical history that includes Total shoulder replacement; Shoulder surgery; Tonsillectomy; Bone graft hip iliac crest; Spine surgery (10/23/2017); Ventral hernia repair (N/A, 05/29/2018); Insertion of mesh (N/A, 05/29/2018); Eye surgery; Cardiac catheterization; Breast lumpectomy (Right, 2009); Breast biopsy (Left, 2009); Breast biopsy (Right, 2009); Colonoscopy with propofol (N/A, 05/15/2020); and Spinal fusion. Maria Spencer has a current medication list which includes the following prescription(s): atorvastatin, cholecalciferol, ergocalciferol, fluticasone, forteo, gabapentin, levothyroxine, lisinopril, metoprolol succinate, metronidazole, multi-vitamins, and zolpidem. Her primarily concern today is the Neck Pain  Initial Vital Signs:  Pulse/HCG Rate: 98  Temp: 97.6 F (36.4 C) Resp: 16 BP: (!) 151/76 SpO2: 98 %  BMI: Estimated body mass index is 25.65 kg/m as calculated from the following:   Height as of this encounter: 4\' 11"  (1.499 m).   Weight as of this encounter: 127 lb (57.6 kg).  Risk Assessment: Allergies: Reviewed. She is allergic to sulfa antibiotics, nitrofuran derivatives, amlodipine besylate, and nitrofurantoin.  Allergy Precautions: None  required Coagulopathies: Reviewed. None identified.  Blood-thinner therapy: None at this time Active Infection(s): Reviewed.  None identified. Maria Spencer is afebrile  Site Confirmation: Maria Spencer was asked to confirm the procedure and laterality before marking the site Procedure checklist: Completed Consent: Before the procedure and under the influence of no sedative(s), amnesic(s), or anxiolytics, the patient was informed of the treatment options, risks and possible complications. To fulfill our ethical and legal obligations, as recommended by the American Medical Association's Code of Ethics, I have informed the patient of my clinical impression; the nature and purpose of the treatment or procedure; the risks, benefits, and possible complications of the intervention; the alternatives, including doing nothing; the risk(s) and benefit(s) of the alternative treatment(s) or procedure(s); and the risk(s) and benefit(s) of doing nothing. The patient was provided information about the general risks and possible complications associated with the procedure. These may include, but are not limited to: failure to achieve desired goals, infection, bleeding, organ or nerve damage, allergic reactions, paralysis, and death. In addition, the patient was informed of those risks and complications associated to the procedure, such as failure to decrease pain; infection; bleeding; organ or nerve damage with subsequent damage to sensory, motor, and/or autonomic systems, resulting in permanent pain, numbness, and/or weakness of one or several areas of the body; allergic reactions; (i.e.: anaphylactic reaction); and/or death. Furthermore, the patient was informed of those risks and complications associated with the medications. These include, but are not limited to: allergic reactions (i.e.: anaphylactic or anaphylactoid reaction(s)); adrenal axis suppression; blood sugar elevation that in diabetics may result in ketoacidosis or  comma; water retention that in patients with history of congestive heart failure may result in shortness of breath, pulmonary edema, and decompensation with resultant heart failure; weight gain; swelling or edema; medication-induced neural toxicity; particulate matter embolism and blood vessel occlusion with resultant organ, and/or nervous system infarction; and/or aseptic necrosis of one or more joints. Finally, the patient was informed that Medicine is not an exact science; therefore, there is also the possibility of unforeseen or unpredictable risks and/or possible complications that may result in a catastrophic outcome. The patient indicated having understood very clearly. We have given the patient no guarantees and we have made no promises. Enough time was given to the patient to ask questions, all of which were answered to the patient's satisfaction. Ms. Fluegge has indicated that she wanted to continue with the procedure. Attestation: I, the ordering provider, attest that I have discussed with the patient the benefits, risks, side-effects, alternatives, likelihood of achieving goals, and potential problems during recovery for the procedure that I have provided informed consent. Date  Time: 11/09/2020  1:35 PM  Pre-Procedure Preparation:  Monitoring: As per clinic protocol. Respiration, ETCO2, SpO2, BP, heart rate and rhythm monitor placed and checked for adequate function Safety Precautions: Patient was assessed for positional comfort and pressure points before starting the procedure. Time-out: I initiated and conducted the "Time-out" before starting the procedure, as per protocol. The patient was asked to participate by confirming the accuracy of the "Time Out" information. Verification of the correct person, site, and procedure were performed and confirmed by me, the nursing staff, and the patient. "Time-out" conducted as per Joint Commission's Universal Protocol (UP.01.01.01). Time:  1423  Description of Procedure:          Area Prepped: Entire Posterior Right Upper Back Region DuraPrep (Iodine Povacrylex [0.7% available iodine] and Isopropyl Alcohol, 74% w/w) Safety Precautions: Aspiration looking for blood return was conducted prior to all injections. At no point did we inject any substances, as a needle was  being advanced. No attempts were made at seeking any paresthesias. Safe injection practices and needle disposal techniques used. Medications properly checked for expiration dates. SDV (single dose vial) medications used. Description of the Procedure: Protocol guidelines were followed. The patient was placed in position over the fluoroscopy table. The target area was identified and the area prepped in the usual manner. Skin & deeper tissues infiltrated with local anesthetic. Appropriate amount of time allowed to pass for local anesthetics to take effect. The procedure needles were then advanced to the target area. Proper needle placement secured. Negative aspiration confirmed. Solution injected in intermittent fashion, asking for systemic symptoms every 0.5cc of injectate. The needles were then removed and the area cleansed, making sure to leave some of the prepping solution back to take advantage of its long term bactericidal properties.        Vitals:   11/09/20 1333  BP: (!) 151/76  Pulse: 98  Resp: 16  Temp: 97.6 F (36.4 C)  TempSrc: Temporal  SpO2: 98%  Weight: 127 lb (57.6 kg)  Height: 4\' 11"  (1.499 m)    Start Time: 1423 hrs. End Time: 1424 hrs. Materials:  Needle(s) Type: Regular needle Gauge: 25G Length: 1.5-in Medication(s): Please see orders for medications and dosing details.  Imaging Guidance:          Type of Imaging Technique: None used Indication(s): N/A Exposure Time: No patient exposure Contrast: None used. Fluoroscopic Guidance: N/A Ultrasound Guidance: N/A Interpretation: N/A  Antibiotic Prophylaxis:   Anti-infectives (From  admission, onward)   None     Indication(s): None identified  Post-operative Assessment:  Post-procedure Vital Signs:  Pulse/HCG Rate: 98  Temp: 97.6 F (36.4 C) Resp: 16 BP: (!) 151/76 SpO2: 98 %  EBL: None  Complications: No immediate post-treatment complications observed by team, or reported by patient.  Note: The patient tolerated the entire procedure well. A repeat set of vitals were taken after the procedure and the patient was kept under observation following institutional policy, for this type of procedure. Post-procedural neurological assessment was performed, showing return to baseline, prior to discharge. The patient was provided with post-procedure discharge instructions, including a section on how to identify potential problems. Should any problems arise concerning this procedure, the patient was given instructions to immediately contact us, at any time, without hesitation. In any case, we plan to contact the patient by telephone for a follow-up status report regarding this interventional procedure.  Comments:  No additional relevant information.  Plan of Care  Orders:  Orders Placed This Encounter  Procedures  . TRIGGER POINT INJECTION    Scheduling Instructions:     Procedure: Right sided trapezoid bursa injection     Area: Upper Back     Side: Right     Sedation: No Sedation.     Timeframe: Today    Order Specific Question:   Where will this procedure be performed?    Answer:   [facility]  . Informed Consent Details: Physician/Practitioner Attestation; Transcribe to consent form and obtain patient signature    Scheduling Instructions:     Note: Always confirm laterality of pain with Ms. Langenberg, before procedure.     Transcribe to consent form and obtain patient signature.    Order Specific Question:   Physician/Practitioner attestation of informed consent for procedure/surgical case    Answer:   I, the physician/practitioner, attest that I have  discussed with the patient the benefits, risks, side effects, alternatives, likelihood of achieving goals and potential problems  during recovery for the procedure that I have provided informed consent.    Order Specific Question:   Procedure    Answer:   Right trapezoid bursa injection (Trigger Point injection)    Order Specific Question:   Physician/Practitioner performing the procedure    Answer:   Romana Deaton A. Dossie Arbour MD    Order Specific Question:   Indication/Reason    Answer:   Right trapezoid bursitis  . Provide equipment / supplies at bedside    "Block Tray" (Disposable  single use) Needle type: SpinalSpinal Amount/quantity: 1 Size: Regular (1.5-inch) Gauge: 25G    Standing Status:   Standing    Number of Occurrences:   1    Order Specific Question:   Specify    Answer:   Block Tray   Chronic Opioid Analgesic:  No opioid analgesics prescribed by our practice. Highest recorded MME/day: 75 mg/day MME/day: 0 mg/day   Medications ordered for procedure: Meds ordered this encounter  Medications  . DISCONTD: lidocaine (XYLOCAINE) 2 % (with pres) injection 400 mg  . DISCONTD: ropivacaine (PF) 2 mg/mL (0.2%) (NAROPIN) injection 9 mL  . dexamethasone (DECADRON) injection 10 mg  . methylPREDNISolone acetate (DEPO-MEDROL) injection 80 mg  . ropivacaine (PF) 2 mg/mL (0.2%) (NAROPIN) injection 2 mL  . lidocaine (XYLOCAINE) 2 % (with pres) injection 40 mg   Medications administered: We administered dexamethasone, methylPREDNISolone acetate, ropivacaine (PF) 2 mg/mL (0.2%), and lidocaine.  See the medical record for exact dosing, route, and time of administration.  Follow-up plan:   Return in about 2 weeks (around 11/23/2020) for (F2F), (PP) Follow-up.       Interventional treatment options: Planned, scheduled, and/or pending:      Under consideration:   Diagnostic right T9-10 thoracic ESI #1  Diagnostic bilateral T9, T10, T11, and T12 MMB thoracic facet RFA (DIFICULT CANDIDATE  DUE TO HARDWARE)    Therapeutic/palliative (PRN):   Diagnostic bilateral T9, T10, T11, and T12 MMB thoracic facet block #3  Diagnostic/Therapeutic right trapezoid bursa injection #2     Recent Visits Date Type Provider Dept  11/08/20 Office Visit Milinda Pointer, [facility]  10/26/20 Procedure visit Milinda Pointer, [facility]  10/23/20 Office Visit Milinda Pointer, MD [facility]  10/05/20 Procedure visit Milinda Pointer, MD [facility]  10/02/20 Office Visit Milinda Pointer, MD [facility]  09/14/20 Procedure visit Milinda Pointer, MD [facility]  09/13/20 Office Visit Milinda Pointer, MD [facility]  Showing recent visits within past 90 days and meeting all other requirements Today's Visits Date Type Provider Dept  11/09/20 Procedure visit Milinda Pointer, MD [facility]  Showing today's visits and meeting all other requirements Future Appointments Date Type Provider Dept  11/27/20 Appointment Milinda Pointer, MD [facility]  Showing future appointments within next 90 days and meeting all other requirements  Disposition: Discharge home  Discharge (Date  Time): 11/09/2020; 1430 hrs.   Primary Care Physician: McLean-Scocuzza, Nino Glow, MD Location: [facility] Note by: Gaspar Cola, MD Date: 11/09/2020; Time: 3:01 PM  Disclaimer:  Medicine is not an Chief Strategy Officer. The only guarantee in medicine is that nothing is guaranteed. It is important to note that the decision to proceed with this intervention was based on the information collected from the patient. The Data and conclusions were drawn from the patient's questionnaire, the interview, and the physical examination. Because the information was provided in large part by the patient, it cannot be  guaranteed that it has not been purposely or unconsciously manipulated. Every  effort has been made to obtain as much relevant data as possible for this evaluation. It is important to note that the conclusions that lead to this procedure are derived in large part from the available data. Always take into account that the treatment will also be dependent on availability of resources and existing treatment guidelines, considered by other Pain Management Practitioners as being common knowledge and practice, at the time of the intervention. For Medico-Legal purposes, it is also important to point out that variation in procedural techniques and pharmacological choices are the acceptable norm. The indications, contraindications, technique, and results of the above procedure should only be interpreted and judged by a Board-Certified Interventional Pain Specialist with extensive familiarity and expertise in the same exact procedure and technique.

## 2020-11-09 NOTE — Progress Notes (Signed)
Safety precautions to be maintained throughout the outpatient stay will include: orient to surroundings, keep bed in low position, maintain call bell within reach at all times, provide assistance with transfer out of bed and ambulation.  

## 2020-11-09 NOTE — Patient Instructions (Addendum)
____________________________________________________________________________________________  Post-Procedure Discharge Instructions  Instructions:  Apply ice:   Purpose: This will minimize any swelling and discomfort after procedure.   When: Day of procedure, as soon as you get home.  How: Fill a plastic sandwich bag with crushed ice. Cover it with a small towel and apply to injection site.  How long: (15 min on, 15 min off) Apply for 15 minutes then remove x 15 minutes.  Repeat sequence on day of procedure, until you go to bed.  Apply heat:   Purpose: To treat any soreness and discomfort from the procedure.  When: Starting the next day after the procedure.  How: Apply heat to procedure site starting the day following the procedure.  How long: May continue to repeat daily, until discomfort goes away.  Food intake: Start with clear liquids (like water) and advance to regular food, as tolerated.   Physical activities: Keep activities to a minimum for the first 8 hours after the procedure. After that, then as tolerated.  Driving: If you have received any sedation, be responsible and do not drive. You are not allowed to drive for 24 hours after having sedation.  Blood thinner: (Applies only to those taking blood thinners) You may restart your blood thinner 6 hours after your procedure.  Insulin: (Applies only to Diabetic patients taking insulin) As soon as you can eat, you may resume your normal dosing schedule.  Infection prevention: Keep procedure site clean and dry. Shower daily and clean area with soap and water.  Post-procedure Pain Diary: Extremely important that this be done correctly and accurately. Recorded information will be used to determine the next step in treatment. For the purpose of accuracy, follow these rules:  Evaluate only the area treated. Do not report or include pain from an untreated area. For the purpose of this evaluation, ignore all other areas of pain,  except for the treated area.  After your procedure, avoid taking a long nap and attempting to complete the pain diary after you wake up. Instead, set your alarm clock to go off every hour, on the hour, for the initial 8 hours after the procedure. Document the duration of the numbing medicine, and the relief you are getting from it.  Do not go to sleep and attempt to complete it later. It will not be accurate. If you received sedation, it is likely that you were given a medication that may cause amnesia. Because of this, completing the diary at a later time may cause the information to be inaccurate. This information is needed to plan your care.  Follow-up appointment: Keep your post-procedure follow-up evaluation appointment after the procedure (usually 2 weeks for most procedures, 6 weeks for radiofrequencies). DO NOT FORGET to bring you pain diary with you.   Expect: (What should I expect to see with my procedure?)  From numbing medicine (AKA: Local Anesthetics): Numbness or decrease in pain. You may also experience some weakness, which if present, could last for the duration of the local anesthetic.  Onset: Full effect within 15 minutes of injected.  Duration: It will depend on the type of local anesthetic used. On the average, 1 to 8 hours.   From steroids (Applies only if steroids were used): Decrease in swelling or inflammation. Once inflammation is improved, relief of the pain will follow.  Onset of benefits: Depends on the amount of swelling present. The more swelling, the longer it will take for the benefits to be seen. In some cases, up to 10 days.    Duration: Steroids will stay in the system x 2 weeks. Duration of benefits will depend on multiple posibilities including persistent irritating factors.  Side-effects: If present, they may typically last 2 weeks (the duration of the steroids).  Frequent: Cramps (if they occur, drink Gatorade and take over-the-counter Magnesium 450-500 mg  once to twice a day); water retention with temporary weight gain; increases in blood sugar; decreased immune system response; increased appetite.  Occasional: Facial flushing (red, warm cheeks); mood swings; menstrual changes.  Uncommon: Long-term decrease or suppression of natural hormones; bone thinning. (These are more common with higher doses or more frequent use. This is why we prefer that our patients avoid having any injection therapies in other practices.)   Very Rare: Severe mood changes; psychosis; aseptic necrosis.  From procedure: Some discomfort is to be expected once the numbing medicine wears off. This should be minimal if ice and heat are applied as instructed.  Call if: (When should I call?)  You experience numbness and weakness that gets worse with time, as opposed to wearing off.  New onset bowel or bladder incontinence. (Applies only to procedures done in the spine)  Emergency Numbers:  Durning business hours (Monday - Thursday, 8:00 AM - 4:00 PM) (Friday, 9:00 AM - 12:00 Noon): [phone]  After hours: [phone]  NOTE: If you are having a problem and are unable connect with, or to talk to a provider, then go to your nearest urgent care or emergency department. If the problem is serious and urgent, please call 911. ____________________________________________________________________________________________   Pain Management Discharge Instructions  General Discharge Instructions :  If you need to reach your doctor call: Monday-Friday 8:00 am - 4:00 pm at [phone] or toll free [phone].  After clinic hours [phone] to have operator reach doctor.  Bring all of your medication bottles to all your appointments in the pain clinic.  To cancel or reschedule your appointment with Pain Management please remember to call 24 hours in advance to avoid a fee.  Refer to the educational materials which you have been given on: General Risks, I had my  Procedure. Discharge Instructions, Post Sedation.  Post Procedure Instructions:  The drugs you were given will stay in your system until tomorrow, so for the next 24 hours you should not drive, make any legal decisions or drink any alcoholic beverages.  You may eat anything you prefer, but it is better to start with liquids then soups and crackers, and gradually work up to solid foods.  Please notify your doctor immediately if you have any unusual bleeding, trouble breathing or pain that is not related to your normal pain.  Depending on the type of procedure that was done, some parts of your body may feel week and/or numb.  This usually clears up by tonight or the next day.  Walk with the use of an assistive device or accompanied by an adult for the 24 hours.  You may use ice on the affected area for the first 24 hours.  Put ice in a Ziploc bag and cover with a towel and place against area 15 minutes on 15 minutes off.  You may switch to heat after 24 hours. Trigger Point Injection Trigger points are areas where you have pain. A trigger point injection is a shot given in the trigger point to help relieve pain for a few days to a few months. Common places for trigger points include:  The neck.  The shoulders.  The upper back.  The  lower back. A trigger point injection will not cure long-term (chronic) pain permanently. These injections do not always work for every person. For some people, they can help to relieve pain for a few days to a few months. Tell a health care provider about:  Any allergies you have.  All medicines you are taking, including vitamins, herbs, eye drops, creams, and over-the-counter medicines.  Any problems you or family members have had with anesthetic medicines.  Any blood disorders you have.  Any surgeries you have had.  Any medical conditions you have. What are the risks? Generally, this is a safe procedure. However, problems may occur,  including:  Infection.  Bleeding or bruising.  Allergic reaction to the injected medicine.  Irritation of the skin around the injection site. What happens before the procedure? Ask your health care provider about:  Changing or stopping your regular medicines. This is especially important if you are taking diabetes medicines or blood thinners.  Taking medicines such as aspirin and ibuprofen. These medicines can thin your blood. Do not take these medicines unless your health care provider tells you to take them.  Taking over-the-counter medicines, vitamins, herbs, and supplements. What happens during the procedure?   Your health care provider will feel for trigger points. A marker may be used to circle the area for the injection.  The skin over the trigger point will be washed with a germ-killing (antiseptic) solution.  A thin needle is used for the injection. You may feel pain or a twitching feeling when the needle enters the trigger point.  A numbing solution may be injected into the trigger point. Sometimes a medicine to keep down inflammation is also injected.  Your health care provider may move the needle around the area where the trigger point is located until the tightness and twitching goes away.  After the injection, your health care provider may put gentle pressure over the injection site.  The injection site will be covered with a bandage (dressing). The procedure may vary among health care providers and hospitals. What can I expect after treatment? After treatment, you may have:  Soreness and stiffness for 1-2 days.  A dressing. This can be taken off in a few hours or as told by your health care provider. Follow these instructions at home: Injection site care  Remove your dressing as told by your health care provider.  Check your injection site every day for signs of infection. Check for: ? Redness, swelling, or pain. ? Fluid or blood. ? Warmth. ? Pus or a  bad smell. Managing pain, stiffness, and swelling  If directed, put ice on the affected area. ? Put ice in a plastic bag. ? Place a towel between your skin and the bag. ? Leave the ice on for 20 minutes, 2-3 times a day. General instructions  If you were asked to stop your regular medicines, ask your health care provider when you may start taking them again.  Return to your normal activities as told by your health care provider. Ask your health care provider what activities are safe for you.  Do not take baths, swim, or use a hot tub until your health care provider approves.  You may be asked to see an occupational or physical therapist for exercises that reduce muscle strain and stretch the area of the trigger point.  Keep all follow-up visits as told by your health care provider. This is important. Contact a health care provider if:  Your pain comes back, and  it is worse than before the injection. You may need more injections.  You have chills or a fever.  The injection site becomes more painful, red, swollen, or warm to the touch. Summary  A trigger point injection is a shot given in the trigger point to help relieve pain for a few days to a few months.  Common places for trigger point injections are the neck, shoulder, upper back, and lower back.  These injections do not always work for every person, but for some people, the injections can help to relieve pain for a few days to a few months.  Contact a health care provider if symptoms come back or they are worse than before treatment. Also, get help if the injection site becomes more painful, red, swollen, or warm to the touch. This information is not intended to replace advice given to you by your health care provider. Make sure you discuss any questions you have with your health care provider. Document Revised: 12/23/2018 Document Reviewed: 12/23/2018 Elsevier Patient Education  [place].

## 2020-11-10 ENCOUNTER — Telehealth: Payer: Self-pay

## 2020-11-10 NOTE — Telephone Encounter (Signed)
Post procedure follow up.  Patient states she is doing well.   ?

## 2020-11-20 ENCOUNTER — Other Ambulatory Visit: Payer: Self-pay | Admitting: Surgery

## 2020-11-23 DIAGNOSIS — M810 Age-related osteoporosis without current pathological fracture: Secondary | ICD-10-CM | POA: Diagnosis not present

## 2020-11-27 ENCOUNTER — Ambulatory Visit: Payer: Medicare Other | Admitting: Pain Medicine

## 2020-11-29 ENCOUNTER — Other Ambulatory Visit: Payer: Self-pay

## 2020-11-29 ENCOUNTER — Ambulatory Visit (INDEPENDENT_AMBULATORY_CARE_PROVIDER_SITE_OTHER): Payer: Medicare Other | Admitting: Internal Medicine

## 2020-11-29 ENCOUNTER — Encounter: Payer: Self-pay | Admitting: Internal Medicine

## 2020-11-29 ENCOUNTER — Other Ambulatory Visit
Admission: RE | Admit: 2020-11-29 | Discharge: 2020-11-29 | Disposition: A | Discharge status: Home / Self Care | Payer: Medicare Other | Source: Ambulatory Visit | Attending: Surgery | Admitting: Surgery

## 2020-11-29 VITALS — BP 140/78 | HR 99 | Ht 59.0 in | Wt 132.0 lb

## 2020-11-29 DIAGNOSIS — I10 Essential (primary) hypertension: Secondary | ICD-10-CM | POA: Diagnosis not present

## 2020-11-29 DIAGNOSIS — Z01810 Encounter for preprocedural cardiovascular examination: Secondary | ICD-10-CM | POA: Diagnosis not present

## 2020-11-29 HISTORY — DX: Age-related osteoporosis without current pathological fracture: M81.0

## 2020-11-29 HISTORY — DX: Hypothyroidism, unspecified: E03.9

## 2020-11-29 HISTORY — DX: Personal history of urinary calculi: Z87.442

## 2020-11-29 MED ORDER — METOPROLOL SUCCINATE ER 50 MG PO TB24: 50.0000 mg | ORAL_TABLET | Freq: Every day | ORAL | 2 refills | Status: DC

## 2020-11-29 NOTE — Progress Notes (Signed)
Follow-up Outpatient Visit Date: 11/29/2020  Primary Care Provider: McLean-Scocuzza, Pasty Spillers, MD [address] [place] [address]  Chief Complaint: Preop evaluation  HPI:  Maria Spencer is a 75 y.o. female with history of hypertension, hyperlipidemia, right breast cancer status post lumpectomy and radiation, hypothyroidism, and esophagitis, who presents for urgent preoperative evaluation in anticipation of open right gluteus minimus repair by Dr. Joice Lofts next week.  Maria Spencer reports tearing her gluteal tendons after slipping on her porch in the spring.  She has had progressive pain in her right hip and gluteal region that has limited her mobility.  She is scheduled for surgery with Dr. Joice Lofts on 12/05/2020.  From a heart standpoint, Maria Spencer has been doing well without chest pain, shortness of breath, palpitations, lightheadedness, and edema.  She has some left upper back/flank pain that has been present since her spinal fusion in late August.  She does not check her blood pressure regularly at home but notes that it was quite elevated after her spinal fusion.  At some point, she was switched from carvedilol to metoprolol by her PCP.  She currently remains on lisinopril and metoprolol without adverse effects.  She has been trying to minimize her sodium intake.  --------------------------------------------------------------------------------------------------  Cardiovascular History & Procedures: Cardiovascular Problems:  Chest pain  Shortness of breath  Palpitations  Risk Factors:  Hypertension, hyperlipidemia, age greater than 54  Cath/PCI:  None available. Patient reports clean catheterization 10 to 15 years ago in [place], [place].  CV Surgery:  None  EP Procedures and Devices:  None  Non-Invasive Evaluation(s):  Cardiac CTA (10/13/2019): No significant CAD.  CAC score = 0.  Incidental note made of incompletely imaged subpleural nodule in the right lower lobe.  TTE  (10/10/2018): Mild focal basal hypertrophy of the septum.  Normal LV size with LVEF 65%.  Grade 1 diastolic dysfunction. Mild MR and TR.  Mild LAE.  Patient reports abnormal stress test attributed to breast attenuation 10 to 15 years ago in [place], [place].   Recent CV Pertinent Labs: Lab Results  Component Value Date   CHOL 208 (H) 07/12/2020   HDL 74 07/12/2020   LDLCALC 112 (H) 07/12/2020   LDLCALC 138 (H) 09/10/2019   TRIG 127 07/12/2020   CHOLHDL 2.8 07/12/2020   CHOLHDL 2.9 09/10/2019   INR 0.9 01/05/2020   K 4.0 05/08/2020   MG 2.0 05/08/2020   BUN 24 (H) 05/08/2020   CREATININE 0.54 05/08/2020   CREATININE 0.57 (L) 09/10/2019    Past medical and surgical history were reviewed and updated in EPIC.  Current Meds  Medication Sig  . atorvastatin (LIPITOR) 10 MG tablet Take 1 tablet (10 mg total) by mouth every other day. Note 10 mg no need to cut in 1/2 as before you have 20 mg taking 1/2 pill =10 mg  . Calcium Carb-Cholecalciferol ([employer] CALCIUM 600 & VITAMIN D3 PO) Take 1 tablet by mouth daily.  . Cholecalciferol 125 MCG (5000 UT) capsule Take 5,000 Units by mouth daily.  . ergocalciferol (VITAMIN D2) 1.25 MG (50000 UT) capsule Take 50,000 Units by mouth every Friday.  . fluticasone (FLONASE) 50 MCG/ACT nasal spray Place 2 sprays into both nostrils daily. Max b/l nostrils (Patient taking differently: Place 2 sprays into both nostrils daily as needed for rhinitis or allergies. Max b/l nostrils)  . gabapentin (NEURONTIN) 100 MG capsule Take 100 mg by mouth 3 (three) times daily.  Marland Kitchen levothyroxine (SYNTHROID) 88 MCG tablet Take 1 tablet (88 mcg total)  by mouth daily before breakfast. 30 minutes  . lisinopril (ZESTRIL) 40 MG tablet Take 1 tablet (40 mg total) by mouth daily.  . metoprolol succinate (TOPROL-XL) 25 MG 24 hr tablet Take 1 tablet (25 mg total) by mouth daily.  . Multiple Vitamin (MULTI-VITAMINS) TABS Take 1 tablet by mouth daily.   . Potassium 99 MG TABS Take 1 tablet  by mouth daily.  Marland Kitchen zolpidem (AMBIEN) 5 MG tablet Take 1 tablet (5 mg total) by mouth at bedtime as needed for sleep.    Allergies: Sulfa antibiotics, Nitrofuran derivatives, Amlodipine besylate, and Nitrofurantoin  Social History   Tobacco Use  . Smoking status: Never Smoker  . Smokeless tobacco: Never Used  Vaping Use  . Vaping Use: Never used  Substance Use Topics  . Alcohol use: Not Currently    Alcohol/week: 7.0 standard drinks    Types: 7 Glasses of wine per week  . Drug use: No    Family History  Problem Relation Age of Onset  . Arthritis Mother   . Heart disease Mother 99  . Hyperlipidemia Mother   . Hypertension Mother   . Coronary artery disease Mother 18  . Arthritis Father   . Diabetes Father   . Cancer Father        colon  . AAA (abdominal aortic aneurysm) Father   . Arthritis Sister   . Hyperlipidemia Sister   . Hypertension Sister   . Cancer Brother        lung, smoker  . Mental retardation Brother   . Drug abuse Daughter        overdose in 2018   . Arthritis Sister   . Hyperlipidemia Sister   . Bladder Cancer Neg Hx   . Kidney cancer Neg Hx   . Breast cancer Neg Hx     Review of Systems: A 12-system review of systems was performed and was negative except as noted in the HPI.  --------------------------------------------------------------------------------------------------  Physical Exam: BP 140/78 (BP Location: Left Arm, Patient Position: Sitting, Cuff Size: Normal)   Pulse 99   Ht 4\' 11"  (1.499 m)   Wt 132 lb (59.9 kg)   SpO2 96%   BMI 26.66 kg/m   General:  NAD. Neck: No JVD or HJR. Lungs: Clear to auscultation bilaterally without wheezes or crackles. Heart: Regular rate and rhythm without murmurs, rubs, or gallops. Abdomen: Soft, nontender, nondistended. Extremities: No lower extremity edema.  EKG: Normal sinus rhythm with nonspecific ST/T changes.  No significant change since 10/27/2019.  Lab Results  Component Value Date    WBC 4.8 01/05/2020   HGB 13.7 01/05/2020   HCT 43.1 01/05/2020   MCV 88.0 01/05/2020   PLT 211 01/05/2020    Lab Results  Component Value Date   NA 138 05/08/2020   K 4.0 05/08/2020   CL 102 05/08/2020   CO2 26 05/08/2020   BUN 24 (H) 05/08/2020   CREATININE 0.54 05/08/2020   GLUCOSE 102 (H) 05/08/2020   ALT 15 05/08/2020    Lab Results  Component Value Date   CHOL 208 (H) 07/12/2020   HDL 74 07/12/2020   LDLCALC 112 (H) 07/12/2020   TRIG 127 07/12/2020   CHOLHDL 2.8 07/12/2020    --------------------------------------------------------------------------------------------------  ASSESSMENT AND PLAN: Hypertension: Blood pressure borderline elevated today, actually improved from some prior readings in the chart.  I recommend increasing metoprolol succinate to 50 mg daily and continuation of lisinopril 40 mg daily.  Further management of hypertension per PCP.  Preoperative cardiovascular  risk assessment: Maria Spencer does not have any symptoms to suggest unstable cardiac disease, though it should be noted that her mobility is limited due to her right gluteal/hip pain.  Cardiac CTA just over a year ago showed no significant CAD.  I think it is reasonable for her to proceed with elective orthopedic surgery, which is low risk from a heart standpoint, without additional testing or intervention.  Follow-up: Return to clinic as needed.  [name], MD 11/29/2020 3:21 PM

## 2020-11-29 NOTE — Progress Notes (Signed)
  [facility] Perioperative Services: Pre-Admission/Anesthesia Testing     Date: 11/29/20  Name: Maria Spencer MRN:   [id number]  Re: Need for cardiology evaluation prior to surgery   Case: [id number] Date/Time: 12/05/20 1315   Procedure: OPEN REPAIR OF RIGHT GLUTEUS MINIMUS TENDON (Right Hip)   Anesthesia type: Choice   Pre-op diagnosis: Tear of right gluteus minimus tendon, initial encounter S76.011A   Location: [facility] OR ROOM 03 / [facility] ORS FOR ANESTHESIA [facility]   Surgeons: Christena Flake, MD    Patient is scheduled for the above procedure on 12/05/2020 with Dr. Leron Croak.  In review of her past medical history, patient with a hypertension diagnosis.  Patient was seen by her PCP on 10/09/2020 and noted to have a blood pressure of 186/95.  Patient on lisinopril 40 mg daily and metoprolol 25 mg daily.  MD notes multiple outside office visits with blood pressure elevations.  PCP asking for patient be seen in clinic or to follow-up with cardiology for further evaluation and management.  Patient has not followed up with either PCP or with cardiology as recommended at this point.  Of note, patient was seen in the pain management clinic on 11/09/2020 at which time her blood pressure was documented at 151/76.  Call placed to PCP (Mclean-Scocuzza, MD) to discuss.  Recent blood pressure measurements reviewed by MD.  PCP asking for patient to be scheduled with her cardiologist for further evaluation of her blood pressure and presurgical clearance.  Call placed to cardiology practice in efforts to secure patient an appointment to be seen.  Cardiology practice graciously provided appointment for patient to be seen today at 1520 by her primary cardiologist (End, MD).  Appreciate the expedited office visit as patient's procedure is currently scheduled for Tuesday (12/05/2020).  PCP asking for patient's blood pressure to be reduced to a goal of </= 130/80.   We will follow-up in review  treatment plan following patient's visit with cardiology later today.  At this time, no changes are being made to the surgical schedule pending cardiology clearance.  Quentin Mulling, MSN, APRN, FNP-C, CEN [facility]  Peri-operative Services Nurse Practitioner Phone: [phone] 11/29/20 2:19 PM

## 2020-11-29 NOTE — Patient Instructions (Signed)
Medication Instructions:  Your physician has recommended you make the following change in your medication:  1- INCREASE Metoprolol succinate to 50 mg by mouth once a day.  *If you need a refill on your cardiac medications before your next appointment, please call your pharmacy*  Follow-Up: At [facility], you and your health needs are our priority.  As part of our continuing mission to provide you with exceptional heart care, we have created designated Provider Care Teams.  These Care Teams include your primary Cardiologist (physician) and Advanced Practice Providers (APPs -  Physician Assistants and Nurse Practitioners) who all work together to provide you with the care you need, when you need it.  We recommend signing up for the patient portal called "MyChart".  Sign up information is provided on this After Visit Summary.  MyChart is used to connect with patients for Virtual Visits (Telemedicine).  Patients are able to view lab/test results, encounter notes, upcoming appointments, etc.  Non-urgent messages can be sent to your provider as well.   To learn more about what you can do with MyChart, go to [email or web address].    Your next appointment:   As needed.

## 2020-11-29 NOTE — Patient Instructions (Addendum)
Your procedure is scheduled on: Tuesday, January 11 Report to the Registration Desk on the 1st floor of the [facility]. To find out your arrival time, please call [phone] between 1PM - 3PM on: Monday, January 10  REMEMBER: Instructions that are not followed completely may result in serious medical risk, up to and including death; or upon the discretion of your surgeon and anesthesiologist your surgery may need to be rescheduled.  Do not eat food after midnight the night before surgery.  No gum chewing, lozengers or hard candies.  You may however, drink CLEAR liquids up to 2 hours before you are scheduled to arrive for your surgery. Do not drink anything within 2 hours of your scheduled arrival time.  Clear liquids include:  - water  - apple juice without pulp - gatorade (not RED, PURPLE, OR BLUE) - black coffee or tea (Do NOT add milk or creamers to the coffee or tea) Do NOT drink anything that is not on this list.  In addition, your doctor has ordered for you to drink the provided  Ensure Pre-Surgery Clear Carbohydrate Drink  Drinking this carbohydrate drink up to two hours before surgery helps to reduce insulin resistance and improve patient outcomes. Please complete drinking 2 hours prior to scheduled arrival time.  TAKE THESE MEDICATIONS THE MORNING OF SURGERY WITH A SIP OF WATER:  1.  Gabapentin 2.  Levothyroxine 3.  Metoprolol  One week prior to surgery: starting January 4 Stop Anti-inflammatories (NSAIDS) such as Advil, Aleve, Ibuprofen, Motrin, Naproxen, Naprosyn and Aspirin based products such as Excedrin, Goodys Powder, BC Powder. Stop ANY OVER THE COUNTER supplements until after surgery.(calcium) (However, you may continue taking Vitamin D,multivitamin up until the day before surgery.)  No Alcohol for 24 hours before or after surgery.  No Smoking including e-cigarettes for 24 hours prior to surgery.  No chewable tobacco products for at least 6 hours prior to  surgery.  No nicotine patches on the day of surgery.  On the morning of surgery brush your teeth with toothpaste and water, you may rinse your mouth with mouthwash if you wish. Do not swallow any toothpaste or mouthwash.  Do not wear jewelry, make-up, hairpins, clips or nail polish.  Do not wear lotions, powders, or perfumes.   Do not shave body from the neck down 48 hours prior to surgery just in case you cut yourself which could leave a site for infection.  Also, freshly shaved skin may become irritated if using the CHG soap.  Do not bring valuables to the hospital. [facility] is not responsible for any missing/lost belongings or valuables.   Use CHG Soap as directed on instruction sheet.  Notify your doctor if there is any change in your medical condition (cold, fever, infection).  Wear comfortable clothing (specific to your surgery type) to the hospital.  Plan for stool softeners for home use; pain medications have a tendency to cause constipation. You can also help prevent constipation by eating foods high in fiber such as fruits and vegetables and drinking plenty of fluids as your diet allows.  After surgery, you can help prevent lung complications by doing breathing exercises.  Take deep breaths and cough every 1-2 hours. Your doctor may order a device called an Incentive Spirometer to help you take deep breaths.  If you are being discharged the day of surgery, you will not be allowed to drive home. You will need a responsible adult (18 years or older) to drive you home and  stay with you that night.   If you are taking public transportation, you will need to have a responsible adult (18 years or older) with you. Please confirm with your physician that it is acceptable to use public transportation.   Please call the Pre-admissions Testing Dept. at [phone] if you have any questions about these instructions.  Visitation Policy:  Patients undergoing a surgery or  procedure may have one family member or support person with them as long as that person is not COVID-19 positive or experiencing its symptoms.  That person may remain in the waiting area during the procedure.

## 2020-12-01 ENCOUNTER — Other Ambulatory Visit: Payer: Self-pay

## 2020-12-01 ENCOUNTER — Other Ambulatory Visit
Admission: RE | Admit: 2020-12-01 | Discharge: 2020-12-01 | Disposition: A | Discharge status: Home / Self Care | Payer: Medicare Other | Source: Ambulatory Visit | Attending: Surgery | Admitting: Surgery

## 2020-12-01 DIAGNOSIS — Z20822 Contact with and (suspected) exposure to covid-19: Secondary | ICD-10-CM | POA: Diagnosis not present

## 2020-12-01 DIAGNOSIS — Z01812 Encounter for preprocedural laboratory examination: Secondary | ICD-10-CM | POA: Diagnosis not present

## 2020-12-02 LAB — SARS CORONAVIRUS 2 (TAT 6-24 HRS): SARS Coronavirus 2: NEGATIVE

## 2020-12-05 ENCOUNTER — Encounter
Admission: RE | Disposition: A | Discharge status: Home / Self Care | Payer: Self-pay | Source: Home / Self Care | Attending: Surgery

## 2020-12-05 ENCOUNTER — Encounter: Payer: Self-pay | Admitting: Surgery

## 2020-12-05 ENCOUNTER — Ambulatory Visit: Payer: Medicare Other | Admitting: Urgent Care

## 2020-12-05 ENCOUNTER — Ambulatory Visit
Admission: RE | Admit: 2020-12-05 | Discharge: 2020-12-06 | Disposition: A | Discharge status: Home / Self Care | Payer: Medicare Other | Attending: Surgery | Admitting: Surgery

## 2020-12-05 ENCOUNTER — Other Ambulatory Visit: Payer: Self-pay

## 2020-12-05 DIAGNOSIS — Z888 Allergy status to other drugs, medicaments and biological substances status: Secondary | ICD-10-CM | POA: Insufficient documentation

## 2020-12-05 DIAGNOSIS — Z96611 Presence of right artificial shoulder joint: Secondary | ICD-10-CM | POA: Insufficient documentation

## 2020-12-05 DIAGNOSIS — Z8249 Family history of ischemic heart disease and other diseases of the circulatory system: Secondary | ICD-10-CM | POA: Diagnosis not present

## 2020-12-05 DIAGNOSIS — M47819 Spondylosis without myelopathy or radiculopathy, site unspecified: Secondary | ICD-10-CM | POA: Insufficient documentation

## 2020-12-05 DIAGNOSIS — Z853 Personal history of malignant neoplasm of breast: Secondary | ICD-10-CM | POA: Diagnosis not present

## 2020-12-05 DIAGNOSIS — X58XXXA Exposure to other specified factors, initial encounter: Secondary | ICD-10-CM | POA: Diagnosis not present

## 2020-12-05 DIAGNOSIS — Z923 Personal history of irradiation: Secondary | ICD-10-CM | POA: Insufficient documentation

## 2020-12-05 DIAGNOSIS — M5030 Other cervical disc degeneration, unspecified cervical region: Secondary | ICD-10-CM | POA: Insufficient documentation

## 2020-12-05 DIAGNOSIS — Z833 Family history of diabetes mellitus: Secondary | ICD-10-CM | POA: Diagnosis not present

## 2020-12-05 DIAGNOSIS — Z801 Family history of malignant neoplasm of trachea, bronchus and lung: Secondary | ICD-10-CM | POA: Diagnosis not present

## 2020-12-05 DIAGNOSIS — G894 Chronic pain syndrome: Secondary | ICD-10-CM | POA: Diagnosis not present

## 2020-12-05 DIAGNOSIS — M160 Bilateral primary osteoarthritis of hip: Secondary | ICD-10-CM | POA: Diagnosis not present

## 2020-12-05 DIAGNOSIS — Z881 Allergy status to other antibiotic agents status: Secondary | ICD-10-CM | POA: Insufficient documentation

## 2020-12-05 DIAGNOSIS — Z882 Allergy status to sulfonamides status: Secondary | ICD-10-CM | POA: Insufficient documentation

## 2020-12-05 DIAGNOSIS — Z800 Family history of malignant neoplasm of digestive organs: Secondary | ICD-10-CM | POA: Insufficient documentation

## 2020-12-05 DIAGNOSIS — M810 Age-related osteoporosis without current pathological fracture: Secondary | ICD-10-CM | POA: Insufficient documentation

## 2020-12-05 DIAGNOSIS — Z79899 Other long term (current) drug therapy: Secondary | ICD-10-CM | POA: Diagnosis not present

## 2020-12-05 DIAGNOSIS — S76011A Strain of muscle, fascia and tendon of right hip, initial encounter: Secondary | ICD-10-CM | POA: Diagnosis not present

## 2020-12-05 DIAGNOSIS — M47812 Spondylosis without myelopathy or radiculopathy, cervical region: Secondary | ICD-10-CM | POA: Diagnosis not present

## 2020-12-05 DIAGNOSIS — Z8262 Family history of osteoporosis: Secondary | ICD-10-CM | POA: Insufficient documentation

## 2020-12-05 DIAGNOSIS — Z87442 Personal history of urinary calculi: Secondary | ICD-10-CM | POA: Insufficient documentation

## 2020-12-05 DIAGNOSIS — Z87440 Personal history of urinary (tract) infections: Secondary | ICD-10-CM | POA: Diagnosis not present

## 2020-12-05 HISTORY — PX: QUADRICEPS TENDON REPAIR: SHX756

## 2020-12-05 SURGERY — REPAIR, TENDON, QUADRICEPS
Anesthesia: General | Site: Hip | Laterality: Right

## 2020-12-05 MED ORDER — PHENYLEPHRINE HCL (PRESSORS) 10 MG/ML IV SOLN
INTRAVENOUS | Status: DC | PRN
  Administered 2020-12-05: 50 ug via INTRAVENOUS
  Administered 2020-12-05: 100 ug via INTRAVENOUS
  Administered 2020-12-05: 50 ug via INTRAVENOUS

## 2020-12-05 MED ORDER — KETOROLAC TROMETHAMINE 15 MG/ML IJ SOLN
INTRAMUSCULAR | Status: AC
  Administered 2020-12-05: 7.5 mg via INTRAVENOUS
  Filled 2020-12-05: qty 1

## 2020-12-05 MED ORDER — SUGAMMADEX SODIUM 200 MG/2ML IV SOLN
INTRAVENOUS | Status: DC | PRN
  Administered 2020-12-05: 100 mg via INTRAVENOUS

## 2020-12-05 MED ORDER — CHLORHEXIDINE GLUCONATE 0.12 % MT SOLN: 15.0000 mL | Freq: Once | OROMUCOSAL | Status: AC

## 2020-12-05 MED ORDER — LEVOTHYROXINE SODIUM 88 MCG PO TABS
88.0000 ug | ORAL_TABLET | Freq: Every day | ORAL | Status: DC
  Administered 2020-12-06: 88 ug via ORAL
  Filled 2020-12-05: qty 1

## 2020-12-05 MED ORDER — FAMOTIDINE 20 MG PO TABS: 20.0000 mg | ORAL_TABLET | Freq: Once | ORAL | Status: AC

## 2020-12-05 MED ORDER — HYDROMORPHONE HCL 1 MG/ML IJ SOLN
INTRAMUSCULAR | Status: AC
  Administered 2020-12-05: 0.25 mg via INTRAVENOUS
  Filled 2020-12-05: qty 1

## 2020-12-05 MED ORDER — PROPOFOL 10 MG/ML IV BOLUS
INTRAVENOUS | Status: DC | PRN
  Administered 2020-12-05: 80 mg via INTRAVENOUS

## 2020-12-05 MED ORDER — DEXAMETHASONE SODIUM PHOSPHATE 10 MG/ML IJ SOLN
INTRAMUSCULAR | Status: DC | PRN
  Administered 2020-12-05: 10 mg via INTRAVENOUS

## 2020-12-05 MED ORDER — PHENYLEPHRINE HCL (PRESSORS) 10 MG/ML IV SOLN
INTRAVENOUS | Status: AC
  Filled 2020-12-05: qty 1

## 2020-12-05 MED ORDER — ENOXAPARIN SODIUM 40 MG/0.4ML ~~LOC~~ SOLN: 40.0000 mg | SUBCUTANEOUS | Status: DC

## 2020-12-05 MED ORDER — FENTANYL CITRATE (PF) 100 MCG/2ML IJ SOLN
INTRAMUSCULAR | Status: AC
  Administered 2020-12-05: 25 ug via INTRAVENOUS
  Filled 2020-12-05: qty 2

## 2020-12-05 MED ORDER — ONDANSETRON HCL 4 MG/2ML IJ SOLN: 4.0000 mg | Freq: Four times a day (QID) | INTRAMUSCULAR | Status: DC | PRN

## 2020-12-05 MED ORDER — BISACODYL 10 MG RE SUPP
10.0000 mg | Freq: Every day | RECTAL | Status: DC | PRN
  Filled 2020-12-05: qty 1

## 2020-12-05 MED ORDER — CHLORHEXIDINE GLUCONATE 0.12 % MT SOLN
OROMUCOSAL | Status: AC
  Administered 2020-12-05: 15 mL via OROMUCOSAL
  Filled 2020-12-05: qty 15

## 2020-12-05 MED ORDER — ACETAMINOPHEN 500 MG PO TABS: 1000.0000 mg | ORAL_TABLET | Freq: Four times a day (QID) | ORAL | Status: DC

## 2020-12-05 MED ORDER — FLEET ENEMA 7-19 GM/118ML RE ENEM: 1.0000 | ENEMA | Freq: Once | RECTAL | Status: DC | PRN

## 2020-12-05 MED ORDER — LACTATED RINGERS IV SOLN: INTRAVENOUS | Status: DC

## 2020-12-05 MED ORDER — DOCUSATE SODIUM 100 MG PO CAPS
100.0000 mg | ORAL_CAPSULE | Freq: Two times a day (BID) | ORAL | Status: DC
  Administered 2020-12-05: 100 mg via ORAL
  Filled 2020-12-05 (×3): qty 1

## 2020-12-05 MED ORDER — OXYCODONE HCL 5 MG PO TABS
ORAL_TABLET | ORAL | Status: AC
  Administered 2020-12-05: 5 mg via ORAL
  Filled 2020-12-05: qty 1

## 2020-12-05 MED ORDER — MAGNESIUM HYDROXIDE 400 MG/5ML PO SUSP
30.0000 mL | Freq: Every day | ORAL | Status: DC | PRN
Start: 2020-12-05 — End: 2020-12-06

## 2020-12-05 MED ORDER — HYDROMORPHONE HCL 1 MG/ML IJ SOLN
0.2500 mg | INTRAMUSCULAR | Status: AC | PRN
  Administered 2020-12-05 (×3): 0.25 mg via INTRAVENOUS

## 2020-12-05 MED ORDER — KETOROLAC TROMETHAMINE 15 MG/ML IJ SOLN
15.0000 mg | Freq: Once | INTRAMUSCULAR | Status: AC
  Administered 2020-12-05: 15 mg via INTRAVENOUS

## 2020-12-05 MED ORDER — PROPOFOL 500 MG/50ML IV EMUL
INTRAVENOUS | Status: AC
  Filled 2020-12-05: qty 50

## 2020-12-05 MED ORDER — OXYCODONE HCL 5 MG PO TABS
5.0000 mg | ORAL_TABLET | ORAL | Status: DC | PRN
Start: 2020-12-05 — End: 2020-12-06

## 2020-12-05 MED ORDER — METOCLOPRAMIDE HCL 5 MG/ML IJ SOLN: 5.0000 mg | Freq: Three times a day (TID) | INTRAMUSCULAR | Status: DC | PRN

## 2020-12-05 MED ORDER — EPHEDRINE SULFATE 50 MG/ML IJ SOLN
INTRAMUSCULAR | Status: DC | PRN
  Administered 2020-12-05 (×2): 5 mg via INTRAVENOUS

## 2020-12-05 MED ORDER — FENTANYL CITRATE (PF) 100 MCG/2ML IJ SOLN
INTRAMUSCULAR | Status: DC | PRN
  Administered 2020-12-05 (×2): 50 ug via INTRAVENOUS

## 2020-12-05 MED ORDER — GABAPENTIN 100 MG PO CAPS
100.0000 mg | ORAL_CAPSULE | Freq: Three times a day (TID) | ORAL | Status: DC
  Administered 2020-12-05: 100 mg via ORAL

## 2020-12-05 MED ORDER — METOCLOPRAMIDE HCL 5 MG/ML IJ SOLN
5.0000 mg | Freq: Three times a day (TID) | INTRAMUSCULAR | Status: DC | PRN
Start: 2020-12-05 — End: 2020-12-06

## 2020-12-05 MED ORDER — EPINEPHRINE PF 1 MG/ML IJ SOLN
INTRAMUSCULAR | Status: AC
  Filled 2020-12-05: qty 1

## 2020-12-05 MED ORDER — KETOROLAC TROMETHAMINE 15 MG/ML IJ SOLN
INTRAMUSCULAR | Status: AC
  Filled 2020-12-05: qty 1

## 2020-12-05 MED ORDER — CALCIUM CARBONATE-VITAMIN D 500-200 MG-UNIT PO TABS
ORAL_TABLET | Freq: Every day | ORAL | Status: DC
  Filled 2020-12-05: qty 1

## 2020-12-05 MED ORDER — DEXAMETHASONE SODIUM PHOSPHATE 10 MG/ML IJ SOLN
INTRAMUSCULAR | Status: AC
  Filled 2020-12-05: qty 1

## 2020-12-05 MED ORDER — ONDANSETRON HCL 4 MG PO TABS: 4.0000 mg | ORAL_TABLET | Freq: Four times a day (QID) | ORAL | Status: DC | PRN

## 2020-12-05 MED ORDER — DIPHENHYDRAMINE HCL 12.5 MG/5ML PO ELIX
12.5000 mg | ORAL_SOLUTION | ORAL | Status: DC | PRN
  Filled 2020-12-05: qty 10

## 2020-12-05 MED ORDER — KETOROLAC TROMETHAMINE 15 MG/ML IJ SOLN: 7.5000 mg | Freq: Four times a day (QID) | INTRAMUSCULAR | Status: DC

## 2020-12-05 MED ORDER — SODIUM CHLORIDE 0.9 % IV SOLN
INTRAVENOUS | Status: DC | PRN
  Administered 2020-12-05: 60 mL

## 2020-12-05 MED ORDER — METOPROLOL SUCCINATE ER 25 MG PO TB24
50.0000 mg | ORAL_TABLET | Freq: Every day | ORAL | Status: DC
  Administered 2020-12-06: 50 mg via ORAL
  Filled 2020-12-05: qty 2

## 2020-12-05 MED ORDER — ZOLPIDEM TARTRATE 5 MG PO TABS
5.0000 mg | ORAL_TABLET | Freq: Every evening | ORAL | Status: DC | PRN
  Administered 2020-12-05: 5 mg via ORAL

## 2020-12-05 MED ORDER — ACETAMINOPHEN 10 MG/ML IV SOLN
INTRAVENOUS | Status: AC
  Filled 2020-12-05: qty 100

## 2020-12-05 MED ORDER — FENTANYL CITRATE (PF) 100 MCG/2ML IJ SOLN
INTRAMUSCULAR | Status: AC
  Filled 2020-12-05: qty 2

## 2020-12-05 MED ORDER — MIDAZOLAM HCL 2 MG/2ML IJ SOLN
INTRAMUSCULAR | Status: DC | PRN
  Administered 2020-12-05: 1 mg via INTRAVENOUS

## 2020-12-05 MED ORDER — SODIUM CHLORIDE 0.9 % IV SOLN: INTRAVENOUS | Status: DC

## 2020-12-05 MED ORDER — LISINOPRIL 20 MG PO TABS
40.0000 mg | ORAL_TABLET | Freq: Every day | ORAL | Status: DC
  Filled 2020-12-05 (×2): qty 2

## 2020-12-05 MED ORDER — ROCURONIUM BROMIDE 100 MG/10ML IV SOLN
INTRAVENOUS | Status: DC | PRN
  Administered 2020-12-05: 50 mg via INTRAVENOUS

## 2020-12-05 MED ORDER — EPHEDRINE 5 MG/ML INJ
INTRAVENOUS | Status: AC
  Filled 2020-12-05: qty 10

## 2020-12-05 MED ORDER — ADULT MULTIVITAMIN W/MINERALS CH
1.0000 | ORAL_TABLET | Freq: Every day | ORAL | Status: DC
  Filled 2020-12-05: qty 1

## 2020-12-05 MED ORDER — ORAL CARE MOUTH RINSE: 15.0000 mL | Freq: Once | OROMUCOSAL | Status: AC

## 2020-12-05 MED ORDER — POTASSIUM 99 MG PO TABS: 1.0000 | ORAL_TABLET | Freq: Every day | ORAL | Status: DC

## 2020-12-05 MED ORDER — BUPIVACAINE LIPOSOME 1.3 % IJ SUSP
INTRAMUSCULAR | Status: AC
  Filled 2020-12-05: qty 20

## 2020-12-05 MED ORDER — GABAPENTIN 100 MG PO CAPS
ORAL_CAPSULE | ORAL | Status: AC
  Filled 2020-12-05: qty 1

## 2020-12-05 MED ORDER — LIDOCAINE HCL (CARDIAC) PF 100 MG/5ML IV SOSY
PREFILLED_SYRINGE | INTRAVENOUS | Status: DC | PRN
  Administered 2020-12-05: 80 mg via INTRAVENOUS

## 2020-12-05 MED ORDER — SODIUM CHLORIDE FLUSH 0.9 % IV SOLN
INTRAVENOUS | Status: AC
  Filled 2020-12-05: qty 40

## 2020-12-05 MED ORDER — METOCLOPRAMIDE HCL 10 MG PO TABS: 5.0000 mg | ORAL_TABLET | Freq: Three times a day (TID) | ORAL | Status: DC | PRN

## 2020-12-05 MED ORDER — FENTANYL CITRATE (PF) 100 MCG/2ML IJ SOLN
25.0000 ug | INTRAMUSCULAR | Status: DC | PRN
  Administered 2020-12-05: 50 ug via INTRAVENOUS
  Administered 2020-12-05 (×3): 25 ug via INTRAVENOUS

## 2020-12-05 MED ORDER — ONDANSETRON HCL 4 MG/2ML IJ SOLN: 4.0000 mg | Freq: Once | INTRAMUSCULAR | Status: DC | PRN

## 2020-12-05 MED ORDER — FAMOTIDINE 20 MG PO TABS
ORAL_TABLET | ORAL | Status: AC
  Administered 2020-12-05: 20 mg via ORAL
  Filled 2020-12-05: qty 1

## 2020-12-05 MED ORDER — FLUTICASONE PROPIONATE 50 MCG/ACT NA SUSP
2.0000 | Freq: Every day | NASAL | Status: DC | PRN
  Filled 2020-12-05: qty 16

## 2020-12-05 MED ORDER — ACETAMINOPHEN 500 MG PO TABS
ORAL_TABLET | ORAL | Status: AC
  Administered 2020-12-05: 1000 mg via ORAL
  Filled 2020-12-05: qty 2

## 2020-12-05 MED ORDER — ATORVASTATIN CALCIUM 10 MG PO TABS
10.0000 mg | ORAL_TABLET | ORAL | Status: DC
  Filled 2020-12-05: qty 1

## 2020-12-05 MED ORDER — BUPIVACAINE HCL (PF) 0.5 % IJ SOLN
INTRAMUSCULAR | Status: AC
  Filled 2020-12-05: qty 30

## 2020-12-05 MED ORDER — ACETAMINOPHEN 10 MG/ML IV SOLN
INTRAVENOUS | Status: DC | PRN
  Administered 2020-12-05: 1000 mg via INTRAVENOUS

## 2020-12-05 MED ORDER — VITAMIN D (ERGOCALCIFEROL) 1.25 MG (50000 UNIT) PO CAPS: 50000.0000 [IU] | ORAL_CAPSULE | ORAL | Status: DC

## 2020-12-05 MED ORDER — ONDANSETRON HCL 4 MG/2ML IJ SOLN
INTRAMUSCULAR | Status: DC | PRN
  Administered 2020-12-05: 4 mg via INTRAVENOUS

## 2020-12-05 MED ORDER — ONDANSETRON HCL 4 MG/2ML IJ SOLN
INTRAMUSCULAR | Status: AC
  Filled 2020-12-05: qty 2

## 2020-12-05 MED ORDER — CEFAZOLIN SODIUM-DEXTROSE 2-4 GM/100ML-% IV SOLN
2.0000 g | INTRAVENOUS | Status: AC
  Administered 2020-12-05: 2 g via INTRAVENOUS

## 2020-12-05 MED ORDER — MIDAZOLAM HCL 2 MG/2ML IJ SOLN
INTRAMUSCULAR | Status: AC
  Filled 2020-12-05: qty 2

## 2020-12-05 MED ORDER — OXYCODONE HCL 5 MG PO TABS: 5.0000 mg | ORAL_TABLET | ORAL | 0 refills | Status: DC | PRN

## 2020-12-05 MED ORDER — CEFAZOLIN SODIUM-DEXTROSE 2-4 GM/100ML-% IV SOLN
INTRAVENOUS | Status: AC
  Filled 2020-12-05: qty 100

## 2020-12-05 MED ORDER — VITAMIN D3 25 MCG (1000 UNIT) PO TABS
1000.0000 [IU] | ORAL_TABLET | Freq: Every day | ORAL | Status: DC
  Filled 2020-12-05: qty 1

## 2020-12-05 SURGICAL SUPPLY — 54 items
ANCHOR ALL-SUT Q-FIX 2.8 (Anchor) ×8 IMPLANT
ANCHOR SUT QUATTRO KNTLS 4.5 (Anchor) ×4 IMPLANT
BLADE SURG SZ10 CARB STEEL (BLADE) ×4 IMPLANT
BNDG COHESIVE 4X5 TAN STRL (GAUZE/BANDAGES/DRESSINGS) ×4 IMPLANT
BNDG ESMARK 6X12 TAN STRL LF (GAUZE/BANDAGES/DRESSINGS) ×2 IMPLANT
CANISTER SUCT 1200ML W/VALVE (MISCELLANEOUS) ×2 IMPLANT
CHLORAPREP W/TINT 26 (MISCELLANEOUS) ×2 IMPLANT
COVER WAND RF STERILE (DRAPES) ×2 IMPLANT
CUFF TOURN 24 STER (MISCELLANEOUS) IMPLANT
CUFF TOURN 30 STER DUAL PORT (MISCELLANEOUS) IMPLANT
DRAPE 3/4 80X56 (DRAPES) ×2 IMPLANT
DRAPE IMP U-DRAPE 54X76 (DRAPES) ×2 IMPLANT
DRAPE INCISE IOBAN 66X45 STRL (DRAPES) ×2 IMPLANT
DRAPE SPLIT 6X30 W/TAPE (DRAPES) ×4 IMPLANT
DRAPE SURG 17X11 SM STRL (DRAPES) ×4 IMPLANT
DRSG OPSITE POSTOP 4X12 (GAUZE/BANDAGES/DRESSINGS) ×2 IMPLANT
DRSG OPSITE POSTOP 4X8 (GAUZE/BANDAGES/DRESSINGS) ×2 IMPLANT
ELECT BLADE 4.0 EZ CLEAN MEGAD (MISCELLANEOUS) ×2
ELECT REM PT RETURN 9FT ADLT (ELECTROSURGICAL) ×2
ELECTRODE BLDE 4.0 EZ CLN MEGD (MISCELLANEOUS) ×1 IMPLANT
ELECTRODE REM PT RTRN 9FT ADLT (ELECTROSURGICAL) ×1 IMPLANT
GAUZE SPONGE 4X4 12PLY STRL (GAUZE/BANDAGES/DRESSINGS) ×2 IMPLANT
GAUZE XEROFORM 1X8 LF (GAUZE/BANDAGES/DRESSINGS) ×2 IMPLANT
GLOVE BIO SURGEON STRL SZ8 (GLOVE) ×4 IMPLANT
GLOVE INDICATOR 8.0 STRL GRN (GLOVE) ×2 IMPLANT
GOWN STRL REUS W/ TWL LRG LVL3 (GOWN DISPOSABLE) ×2 IMPLANT
GOWN STRL REUS W/ TWL XL LVL3 (GOWN DISPOSABLE) ×1 IMPLANT
GOWN STRL REUS W/TWL LRG LVL3 (GOWN DISPOSABLE) ×2
GOWN STRL REUS W/TWL XL LVL3 (GOWN DISPOSABLE) ×1
HANDLE YANKAUER SUCT BULB TIP (MISCELLANEOUS) ×2 IMPLANT
IMMBOLIZER KNEE 19 BLUE UNIV (SOFTGOODS) ×2 IMPLANT
KIT SUTURE 2.8 Q-FIX DISP (MISCELLANEOUS) ×2 IMPLANT
KIT TURNOVER KIT A (KITS) ×2 IMPLANT
MANIFOLD NEPTUNE II (INSTRUMENTS) ×2 IMPLANT
NDL MAYO CATGUT SZ1 (NEEDLE) ×4
NEEDLE MAYO CATGUT SZ1 (NEEDLE) ×2 IMPLANT
NS IRRIG 1000ML POUR BTL (IV SOLUTION) ×2 IMPLANT
PACK EXTREMITY ARMC (MISCELLANEOUS) ×2 IMPLANT
PACK HIP PROSTHESIS (MISCELLANEOUS) ×2 IMPLANT
RETRIEVER SUT HEWSON (MISCELLANEOUS) ×2 IMPLANT
SPONGE LAP 18X18 RF (DISPOSABLE) ×2 IMPLANT
STAPLER SKIN PROX 35W (STAPLE) ×2 IMPLANT
STOCKINETTE IMPERVIOUS 9X36 MD (GAUZE/BANDAGES/DRESSINGS) ×2 IMPLANT
SUT ETHIBOND #5 BRAIDED 30INL (SUTURE) ×2 IMPLANT
SUT ETHIBOND 0 MO6 C/R (SUTURE) ×2 IMPLANT
SUT FIBERWIRE #2 38 BLUE 1/2 (SUTURE)
SUT VIC AB 0 CT1 36 (SUTURE) ×6 IMPLANT
SUT VIC AB 2-0 CT1 27 (SUTURE) ×3
SUT VIC AB 2-0 CT1 TAPERPNT 27 (SUTURE) ×3 IMPLANT
SUT VIC AB 2-0 CT2 27 (SUTURE) ×4 IMPLANT
SUT VIC AB 3-0 SH 27 (SUTURE) ×2
SUT VIC AB 3-0 SH 27X BRD (SUTURE) ×2 IMPLANT
SUTURE FIBERWR #2 38 BLUE 1/2 (SUTURE) IMPLANT
SYR 30ML LL (SYRINGE) ×4 IMPLANT

## 2020-12-05 NOTE — Anesthesia Procedure Notes (Signed)
Procedure Name: Intubation Date/Time: 12/05/2020 10:09 AM Performed by: Jerrye Noble, CRNA Pre-anesthesia Checklist: Patient identified, Emergency Drugs available, Suction available and Patient being monitored Patient Re-evaluated:Patient Re-evaluated prior to induction Oxygen Delivery Method: Circle system utilized Preoxygenation: Pre-oxygenation with 100% oxygen Induction Type: IV induction Ventilation: Oral airway inserted - appropriate to patient size and Mask ventilation without difficulty Laryngoscope Size: McGraph and 3 Grade View: Grade I Tube type: Oral Tube size: 7.0 mm Number of attempts: 1 Airway Equipment and Method: Stylet and Video-laryngoscopy Placement Confirmation: positive ETCO2,  breath sounds checked- equal and bilateral and ETT inserted through vocal cords under direct vision Secured at: 22 cm Tube secured with: Tape Dental Injury: Teeth and Oropharynx as per pre-operative assessment

## 2020-12-05 NOTE — H&P (Signed)
History of Present Illness: Maria Spencer is a 75 y.o. female who presents today for her surgical history and physical for upcoming repair of right gluteus minimus and gluteus medius tendon tears. Surgery scheduled with Dr. Roland Rack on 12/05/2020. The patient denies any changes in her medical history since she was last evaluated. She denies any personal history of heart attack, stroke, asthma or COPD. Patient denies any history of blood clots. The patient did recently undergo a MRI scan of the left hip which did demonstrate further tearing of the right gluteus medius tendon in addition to previous documented tear of the gluteus minimus tendon. The patient denies any falls or trauma since she was last evaluated. The patient denies any numbness or tingling to the right lower extremity. The patient was informed today that unfortunately due to the increase in demands because of the COVID-19 pandemic, inpatient elective surgeries are being canceled at this time. Instructed the patient that if she were to undergo surgery at this time it would need to be outpatient and she will need to go home the same day of surgery. The patient states that she has family support at home and she does feel comfortable returning home the same day of surgery.  Past Medical History: . Arthritis  . Chicken pox  . History of cancer  . Hyperlipidemia  . Hypertension  . Hypothyroidism  . Motion sickness  . Osteoporosis   Past Surgical History: . ARTHRODESIS POSTERIOR SPINE N/A 07/24/2020  Procedure: ARTHRODESIS, POSTERIOR OR POSTEROLATERAL TECHNIQUE, SINGLE LEVEL; EACH ADDITIONAL VERTEBRAL SEGMENT; Surgeon: Ricard Dillon, MD; Location: [place]; Service: Neurosurgery; Laterality: N/A;  . CATARACT EXTRACTION Right 11/15/2019  . CATARACT EXTRACTION Left 11/09/2019  . EXPLORATION OF SPINAL FUSION 2018  . FLAP MYOCUTANEOUS/FASCIOCUTANEOUS ABDOMEN N/A 07/24/2020  Procedure: MUSCLE, MYOCUTANEOUS, OR FASCIOCUTANEOUS FLAP;  ABDOMEN; Surgeon: Ned Clines, MD; Location: [place]; Service: Plastic Surgery; Laterality: N/A;  . INGUINAL HERNIA REPAIR 2019  . INSTRUMENTATION ANTERIOR SPINE 2 TO 3 VERTEBRAL SEGMENTS N/A 07/24/2020  Procedure: POSTERIOR SEGMENTAL INSTRUMENTATION (EG, PEDICLE FIXATION, DUAL RODS WITH MULTIPLE HOOKS AND SUBLAMINAR WIRES); 13 OR MORE VERTEBRAL SEGMENT; Surgeon: Ricard Dillon, MD; Location: [place]; Service: Neurosurgery; Laterality: N/A;  . INTRAOPERATIVE FLUOROSCOPY N/A 07/24/2020  Procedure: FLUOROSCOPY; Surgeon: Ricard Dillon, MD; Location: [place]; Service: Neurosurgery; Laterality: N/A;  . JOINT REPLACEMENT Right shoulder  . MASTECTOMY PARTIAL / LUMPECTOMY Right  2009  . POSTERIOR LUMBAR SPINE FUSION ONE LEVEL LATERAL TRANSVERSE TECHNIQUE N/A 07/24/2020  Procedure: T4-Pelvis Posterior Spinal Fusion; Surgeon: Ricard Dillon, MD; Location: [place]; Service: Neurosurgery; Laterality: N/A;  . REVISION INSTRUMENTATION SPINE N/A 07/24/2020  Procedure: PELVIC FIXATION (ATTACHMENT OF CAUDAL END OF INSTRUMENTATION TO PELVIC BONY STRUCTURES) OTHER THAN SACRUM; Surgeon: Ricard Dillon, MD; Location: [place]; Service: Neurosurgery; Laterality: N/A;  . ROTATR CUFF SURGERY  . THORACOLUMBAR SPINAL FUSION  . TONSILLECTOMY  . TUBAL LIGATION   Past Family History: . Heart disease Mother  . High blood pressure (Hypertension) Mother  . Osteoporosis (Thinning of bones) Mother  . Colon cancer Father  . Diabetes Father  . No Known Problems Sister  . Lung cancer Brother  . Diabetes Paternal Grandfather   Medications: . acetaminophen (TYLENOL) 500 MG tablet Take 1,000 mg by mouth every 6 (six) hours as needed for Pain  . atorvastatin (LIPITOR) 10 MG tablet Take 10 mg by mouth once daily  . cholecalciferol (CHOLECALCIFEROL) 1000 unit tablet Take 4,000 Units by mouth Daily except Friday  . ergocalciferol, vitamin D2,  1,250 mcg (50,000 unit) capsule Take 1  capsule by mouth once a week Friday  . fluticasone propionate (FLONASE) 50 mcg/actuation nasal spray Place 2 sprays into both nostrils once daily as needed  . gabapentin (NEURONTIN) 100 MG capsule Take 2 capsules (200 mg total) by mouth 3 (three) times daily for 120 days Take as directed, follow schedule (Patient taking differently: Take 300 mg by mouth 3 (three) times daily ) 90 capsule 3  . levothyroxine (SYNTHROID) 88 MCG tablet Take 88 mcg by mouth once daily Take on an empty stomach with a glass of water at least 30-60 minutes before breakfast.  . lisinopriL (ZESTRIL) 40 MG tablet Take 40 mg by mouth once daily  . metoprolol succinate (TOPROL-XL) 50 MG XL tablet Take by mouth Take 1 tablet (50 mg total) by mouth daily. Take with or immediately following a meal  . multivitamin (THERA) tablet Take 1 tablet by mouth once daily  . nitroGLYcerin (NITROSTAT) 0.4 MG SL tablet Place 0.4 mg under the tongue every 5 (five) minutes as needed for Chest pain May take up to 3 doses.  . potassium gluconate 2.5 mEq Tab Take 1 tablet by mouth once daily  . teriparatide (FORTEO) 20 mcg/dose (617mcg/2.4mL) pen injector Inject 0.08 mLs (20 mcg total) subcutaneously once daily 2.4 mL 11  . zolpidem (AMBIEN) 5 MG tablet Take 5 mg by mouth nightly as needed  . metoprolol succinate (TOPROL-XL) 25 MG XL tablet Take 25 mg by mouth once daily (Patient not taking: Reported on 12/01/2020 )   Allergies: . Sulfa (Sulfonamide Antibiotics) Anaphylaxis, Shortness Of Breath, Hives, Itching and Swelling (Lips & throat swelled) . Sulfasalazine Itching  . Nitrofuran Analogues Swelling (Lip swelling; "eyes get red and puffy")  . Amlodipine Besylate Other (See Comments) Gas, bloating  . Nitrofurantoin Other (See Comments) and Hives   Review of Systems:  A comprehensive 14 point ROS was performed, reviewed by me today, and the pertinent orthopaedic findings are documented in the HPI.  Physical Exam: BP (!) 160/90  Ht 149.9 cm  (4\' 11" )  Wt 59.9 kg (132 lb)  BMI 26.66 kg/m  General/Constitutional: The patient appears to be well-nourished, well-developed, and in no acute distress. Neuro/Psych: Normal mood and affect, oriented to person, place and time. Eyes: Non-icteric. Pupils are equal, round, and reactive to light, and exhibit synchronous movement. ENT: Unremarkable. Lymphatic: No palpable adenopathy. Respiratory: Lungs clear to auscultation, Normal chest excursion, No wheezes and Non-labored breathing Cardiovascular: Regular rate and rhythm. No murmurs. and No edema, swelling or tenderness, except as noted in detailed exam. Integumentary: No impressive skin lesions present, except as noted in detailed exam. Musculoskeletal: Unremarkable, except as noted in detailed exam.  Right hip exam: The patient ambulates with a moderately antalgic gait, favoring her right leg, and uses a cane in her left hand for balance and support. Skin inspection of the right hip is unremarkable. No swelling, erythema, ecchymosis, abrasions, or other skin abnormalities are identified. She has moderate focal tenderness palpation over the lateral aspect of the hip, especially at the tip of the greater trochanter. She is able to arise from a seated position with some difficulty as she tries to favor her right leg. In stance, her pelvis is slightly uneven with the right leg being slightly longer than her left. She is able to heel raise and toe raise without difficulty. She has difficulty marching in place due to the pain in the lateral aspect of the right hip. She is able to actively  abduct the hip with mild pain. She is able to tolerate hip flexion to 100 degrees, internal rotation to 15 degrees, and external rotation to 40 degrees. She experiences mild reproduction of her right postero-lateral hip pain with external rotation, but denies any groin pain with these movements. She is neurovascularly intact to the right lower extremity and  foot.  X-rays/MRI/Lab data:  A recent MRI scan is available for review and demonstrates:.  1. No acute osseous abnormality of the left hip.  2. 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  3. New full-thickness tear of the RIGHT gluteus medius tendon with  1.3 cm of retraction.  4. Large RIGHT peritrochanteric bursal fluid collection.  5. Tendinosis of the bilateral hamstring tendon origins with  partial-thickness tear on the left.   Impression: 1. Tear of right gluteus minimus tendon. 2. Tear of right gluteus medius tendon.  Plan:  1. Treatment options were discussed today with the patient. 2. The patient was instructed in detail the typical postop recovery process following this type of surgery. She states that she does feel comfortable proceeding with surgery at this time. 3. The patient is scheduled for a open repair of right gluteus medius and gluteus minimus tendon tears. Surgery scheduled Dr. Roland Rack for 12/05/2020. 4. The patient was offered and received a hip abduction brace for her to wear following surgery. 5. She was instructed on the risk and benefits of surgery and wishes to proceed at this time. 6. This document will serve as a surgical history and physical for the patient. 7. The patient will follow-up per standard postop protocol. They can call the clinic they have any questions, new symptoms develop or symptoms worsen.  The procedure was discussed with the patient, as were the potential risks (including bleeding, infection, nerve and/or blood vessel injury, persistent or recurrent pain, failure of the repair, progression of arthritis, need for further surgery, blood clots, strokes, heart attacks and/or arhythmias, pneumonia, etc.) and benefits. The patient states her understanding and wishes to proceed.   H&P reviewed and patient re-examined. No changes.

## 2020-12-05 NOTE — Discharge Instructions (Addendum)
Orthopedic discharge instructions: May sponge bathe with intact OpSite dressing.  Cover staples with Band-Aids after drying off. Apply ice frequently to knee or use Polar Care. Take ibuprofen 600 mg TID with meals for 7-10 days, then as necessary. Take oxycodone as prescribed when needed.  May supplement with ES Tylenol if necessary. Take aspirin 325 mg daily for 4 weeks. Toe-touch weight-bearing on right leg in hip brace - use a walker when ambulating for balance and support. Follow-up in 10-14 days or as scheduled.  AMBULATORY SURGERY  DISCHARGE INSTRUCTIONS   1) The drugs that you were given will stay in your system until tomorrow so for the next 24 hours you should not:  A) Drive an automobile B) Make any legal decisions C) Drink any alcoholic beverage   2) You may resume regular meals tomorrow.  Today it is better to start with liquids and gradually work up to solid foods.  You may eat anything you prefer, but it is better to start with liquids, then soup and crackers, and gradually work up to solid foods.   3) Please notify your doctor immediately if you have any unusual bleeding, trouble breathing, redness and pain at the surgery site, drainage, fever, or pain not relieved by medication.    4) Additional Instructions: Do not remove green band for 4 days   Please contact your physician with any problems or Same Day Surgery at [phone], Monday through Friday 6 am to 4 pm, or [place] [facility] number at [phone].

## 2020-12-05 NOTE — OR Nursing (Signed)
Baked sweet potato and apple sauce and crackers with ginger ale.  No nausea.

## 2020-12-05 NOTE — Anesthesia Preprocedure Evaluation (Signed)
Anesthesia Evaluation  Patient identified by MRN, date of birth, ID band Patient awake    Reviewed: Allergy & Precautions, H&P , NPO status , Patient's Chart, lab work & pertinent test results, reviewed documented beta blocker date and time   History of Anesthesia Complications Negative for: history of anesthetic complications  Airway Mallampati: II  TM Distance: >3 FB Neck ROM: full    Dental  (+) Dental Advidsory Given, Caps, Teeth Intact, Missing   Pulmonary neg pulmonary ROS,    Pulmonary exam normal breath sounds clear to auscultation       Cardiovascular Exercise Tolerance: Good hypertension, (-) angina(-) Past MI and (-) Cardiac Stents Normal cardiovascular exam(-) dysrhythmias (-) Valvular Problems/Murmurs Rhythm:regular Rate:Normal     Neuro/Psych negative neurological ROS  negative psych ROS   GI/Hepatic Neg liver ROS, GERD  ,  Endo/Other  neg diabetesHypothyroidism   Renal/GU Renal disease (kidney stones)  negative genitourinary   Musculoskeletal   Abdominal   Peds  Hematology negative hematology ROS (+)   Anesthesia Other Findings Past Medical History: No date: Arthritis     Comment:  neck, back, left knee;  2009: Breast cancer ([place])     Comment:  right lumpectomy s/p radiation  No date: Chicken pox No date: Chronic UTI     Comment:  established with urology  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  No date: Esophagitis     Comment:  egd 05/17/14 see report scanned into chart No date: Hormone disorder No date: Hyperlipidemia No date: Hypertension     Comment:  controlled well with medication;  2009: Personal history of radiation therapy     Comment:  F/U right breast cancer No date: Thyroid disease     Comment:  hypothyroidism    Reproductive/Obstetrics negative OB ROS                             Anesthesia Physical  Anesthesia Plan  ASA:  II  Anesthesia Plan: General   Post-op Pain Management:    Induction: Intravenous  PONV Risk Score and Plan: 3 and Ondansetron, Dexamethasone and Treatment may vary due to age or medical condition  Airway Management Planned: Oral ETT  Additional Equipment:   Intra-op Plan:   Post-operative Plan: Extubation in OR  Informed Consent: I have reviewed the patients History and Physical, chart, labs and discussed the procedure including the risks, benefits and alternatives for the proposed anesthesia with the patient or authorized representative who has indicated his/her understanding and acceptance.     Dental Advisory Given  Plan Discussed with: Anesthesiologist, CRNA and Surgeon  Anesthesia Plan Comments:         Anesthesia Quick Evaluation

## 2020-12-05 NOTE — Anesthesia Postprocedure Evaluation (Signed)
Anesthesia Post Note  Patient: Maria Spencer  Procedure(s) Performed: OPEN REPAIR OF RIGHT GLUTEUS MINIMUS TENDON (Right Hip)  Patient location during evaluation: PACU Anesthesia Type: General Level of consciousness: awake and alert Pain management: pain level controlled Vital Signs Assessment: post-procedure vital signs reviewed and stable Respiratory status: spontaneous breathing, nonlabored ventilation, respiratory function stable and patient connected to nasal cannula oxygen Cardiovascular status: blood pressure returned to baseline and stable Postop Assessment: no apparent nausea or vomiting Anesthetic complications: no   No complications documented.   Last Vitals:  Vitals:   12/05/20 1410 12/05/20 1422  BP: 129/81 (!) 115/96  Pulse: 98 87  Resp: (!) 22 16  Temp:  (!) 36.1 C  SpO2: 99% 100%    Last Pain:  Vitals:   12/05/20 1422  TempSrc: Skin  PainSc: 4                  Martha Clan

## 2020-12-05 NOTE — Evaluation (Signed)
Physical Therapy Evaluation Patient Details Name: Maria Spencer MRN: [id number] DOB: [birth year] Today's Date: 12/05/2020   History of Present Illness  75 y/o female here repair of right gluteus minimus and gluteus medius tendon tears 12/05/20.  Clinical Impression  Pt with surgery today, planned for d/c this afternoon however pt simply could not tolerate being on her feet more than 1-2 minutes (~15 max ambulation) before needing to quickly sit.  She showed good motivation and effort with mobility but again was unsafe to manage in-home requirements even with 24/7 assist (sister and brother-in-law will be able to provide initially).  She did well to protect from [facility] and understands TTWBing (generally stayed [place] by default).  Educated on limitations, positioning, course of recovery, expectations, etc.  All questions answered, pt likely will not need rehab, but did not show safety and confidence with mobility to make d/c home this date a safe option.     Follow Up Recommendations Home health PT;Follow surgeon's recommendation for DC plan and follow-up therapies;Supervision/Assistance - 24 hour    Equipment Recommendations       Recommendations for Other Services       Precautions / Restrictions Precautions Precautions: Fall Required Braces or Orthoses: Other Brace (hindges hip brace) Restrictions Weight Bearing Restrictions: Yes RLE Weight Bearing:  (TTWB)      Mobility  Bed Mobility               General bed mobility comments: in recliner on arrival, no bed present.  Pt likely would have struggled with this aspect of mobility    Transfers Overall transfer level: Needs assistance Equipment used: Rolling walker (2 wheeled) Transfers: Sit to/from Stand Sit to Stand: Min assist         General transfer comment: Pt did not need excessive phyical assist to attain standing but needed repeat reminders for hand placement/set up/safety.  She did maintain NWBing/TTWBing well  during transitions but overall did not show confidence or safety.  Ambulation/Gait Ambulation/Gait assistance: Min assist Gait Distance (Feet): 15 Feet Assistive device: Rolling walker (2 wheeled)       General Gait Details: attempted multiple bouts of ambulation but pt quick to fatigue/give out before completing in-home distances.  She did not have any overt LOBs and maintaining WBing well, but had very slow, labored effort and despite great motivation to do more simply had to quickly sit each time (vitals were WNL each time)  Stairs            Wheelchair Mobility    Modified Rankin (Stroke Patients Only)       Balance Overall balance assessment: Needs assistance   Sitting balance-Leahy Scale: Good       Standing balance-Leahy Scale: Fair Standing balance comment: relaint on walker with poor confidence                             Pertinent Vitals/Pain Pain Assessment: 0-10 Pain Score: 6  Pain Location: c/o more sciatic pain than even hip/glute/incision pain; additionally has some chronic L knee pain    Home Living Family/patient expects to be discharged to:: Private residence Living Arrangements: Alone Available Help at Discharge: Family;Available 24 hours/day   Home Access: Level entry     Home Layout: One level Home Equipment: Walker - 2 wheels;Bedside commode;Cane - single point;Wheelchair - manual      Prior Function Level of Independence: Independent with assistive device(s)  Comments: Pt has been using SPC recently due to hip pain, prior to injury independent w/o AD     Hand Dominance        Extremity/Trunk Assessment   Upper Extremity Assessment Upper Extremity Assessment: Overall WFL for tasks assessed;Generalized weakness    Lower Extremity Assessment Lower Extremity Assessment:  (R hip Ab/Ad not tested, otherwise 3+/5, L LE grossly 4-/5)       Communication   Communication: No difficulties  Cognition  Arousal/Alertness: Awake/alert Behavior During Therapy: Anxious Overall Cognitive Status: Within Functional Limits for tasks assessed                                        General Comments      Exercises General Exercises - Lower Extremity Ankle Circles/Pumps: AROM;10 reps Quad Sets: Strengthening;10 reps   Assessment/Plan    PT Assessment Patient needs continued PT services  PT Problem List Decreased strength;Decreased range of motion;Decreased activity tolerance;Decreased balance;Decreased mobility;Decreased coordination;Decreased knowledge of use of DME;Decreased safety awareness;Pain;Decreased knowledge of precautions       PT Treatment Interventions DME instruction;Gait training;Stair training;Functional mobility training;Therapeutic activities;Therapeutic exercise;Balance training;Neuromuscular re-education;Patient/family education    PT Goals (Current goals can be found in the Care Plan section)  Acute Rehab PT Goals Patient Stated Goal: go home PT Goal Formulation: With patient Time For Goal Achievement: 12/19/20 Potential to Achieve Goals: Fair    Frequency 7X/week   Barriers to discharge        Co-evaluation               AM-PAC PT "6 Clicks" Mobility  Outcome Measure Help needed turning from your back to your side while in a flat bed without using bedrails?: A Little Help needed moving from lying on your back to sitting on the side of a flat bed without using bedrails?: A Lot Help needed moving to and from a bed to a chair (including a wheelchair)?: A Lot Help needed standing up from a chair using your arms (e.g., wheelchair or bedside chair)?: A Little Help needed to walk in hospital room?: A Lot Help needed climbing 3-5 steps with a railing? : Total 6 Click Score: 13    End of Session Equipment Utilized During Treatment: Other (comment);Gait belt (hip brace) Activity Tolerance: Patient limited by pain;Patient limited by  fatigue Patient left: in chair;with call bell/phone within reach;with nursing/sitter in room;with family/visitor present Nurse Communication: Mobility status;Weight bearing status PT Visit Diagnosis: Muscle weakness (generalized) (M62.81);Difficulty in walking, not elsewhere classified (R26.2);Pain;Unsteadiness on feet (R26.81) Pain - Right/Left: Right Pain - part of body: Hip    Time: 5573-2202 PT Time Calculation (min) (ACUTE ONLY): 58 min   Charges:   PT Evaluation $PT Eval Low Complexity: 1 Low PT Treatments $Gait Training: 8-22 mins $Therapeutic Exercise: 8-22 mins $Therapeutic Activity: 8-22 mins        Kreg Shropshire, DPT 12/05/2020, 4:56 PM

## 2020-12-05 NOTE — Op Note (Signed)
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  Post-Op Diagnosis:   Same.  Procedure:   Primary repair of gluteus medius and minimus tendon tears, right hip.  Surgeon:   Pascal Lux, MD  Assistant:   Cameron Proud, PA-C  Anesthesia:   GET  Findings:   As above.  Complications:   None  EBL:   40 cc  Fluids:   700 cc crystalloid  UOP:   None  TT:   None  Drains:   None  Closure:   Staples  Implants:   [employer]hew 2.8 mm Q-Fix anchors x4, [place] QuatroLink anchors x2  Brief Clinical Note:   The patient is a 75 year old female with a long history of lateral sided right hip pain. Her symptoms have progressed despite medications, activity modification, therapy, injections, etc. Her history and examination are consistent with chronic gluteus medius and minimus tears of the right hip as confirmed by MRI scan. The patient presents at this time for primary repair of these tendons.   Procedure:   The patient was brought into the operating room and laid in the supine position. After adequate general endotracheal intubation and anesthesia were obtained, the patient was repositioned in the left lateral decubitus position and secured using a lateral hip positioner. The right hip and lower extremity were prepped with ChloroPrep solution before being draped sterilely. Preoperative antibiotics were administered. A timeout was performed to verify the appropriate surgical site.    A standard posterior approach to the hip was made through an approximately 4-5 inch incision. The incision was carried down through the subcutaneous tissues to expose the gluteal fascia and proximal end of the iliotibial band. These structures were split the length of the incision and the [place] self-retaining hip retractor placed. The bursal tissues were debrided to better visualize the hip abductor tendons. The torn margins of  the gluteus medius and minimus tendons were identified and debrided sharply. The exposed greater tuberosity was roughened lightly with a rongeur to improve the potential for healing. The tendons were repaired using four [employer]hew 2.8 mm Q-Fix anchors. The sutures from each of these anchors were passed through the tendon. With the leg in mild abduction, each of the sutures were tied securely. The free ends of the sutures were then divided and secured to the lateral aspect of the greater trochanter using two [place] QuatroLink anchors to further reinforce this repair.  The wound was copiously irrigated with sterile saline solution via the jet lavage system before the peri-incisional and pericapsular tissues were injected with 30 cc of 0.5% Sensorcaine with epinephrine and 20 cc of Exparel diluted out to 60 cc with normal saline to help with postoperative analgesia. The iliotibial band was reapproximated using #0 Vicryl interrupted sutures before the gluteal fascia was closed using a running #0 Vicryl suture. The subcutaneous tissues were closed in several layers using 2-0 Vicryl interrupted sutures before the skin was closed using staples. A sterile occlusive dressing was applied to the wound. The patient was then rolled back into the supine position. The patient was placed into a hip abduction brace with the hinges set at 0 to 45 degrees of hip flexion and 15 degrees of hip abduction. The patient was then awakened and returned to the recovery room in satisfactory condition after tolerating the procedure well.

## 2020-12-05 NOTE — Transfer of Care (Signed)
Immediate Anesthesia Transfer of Care Note  Patient: Maria Spencer  Procedure(s) Performed: OPEN REPAIR OF RIGHT GLUTEUS MINIMUS TENDON (Right Hip)  Patient Location: PACU  Anesthesia Type:General  Level of Consciousness: awake, alert  and patient cooperative  Airway & Oxygen Therapy: Patient Spontanous Breathing and Patient connected to face mask oxygen  Post-op Assessment: Report given to RN and Post -op Vital signs reviewed and stable  Post vital signs: Reviewed and stable  Last Vitals:  Vitals Value Taken Time  BP 132/96   Temp    Pulse 91 12/05/20 1210  Resp 14   SpO2 100 % 12/05/20 1210  Vitals shown include unvalidated device data.  Last Pain:  Vitals:   12/05/20 0912  TempSrc: Temporal  PainSc: 0-No pain         Complications: No complications documented.

## 2020-12-05 NOTE — OR Nursing (Signed)
Report to Vanice Sarah RN and transferred to room 24.

## 2020-12-06 ENCOUNTER — Encounter: Payer: Self-pay | Admitting: Surgery

## 2020-12-06 DIAGNOSIS — S76011A Strain of muscle, fascia and tendon of right hip, initial encounter: Secondary | ICD-10-CM | POA: Diagnosis not present

## 2020-12-06 DIAGNOSIS — Z8262 Family history of osteoporosis: Secondary | ICD-10-CM | POA: Diagnosis not present

## 2020-12-06 DIAGNOSIS — Z8249 Family history of ischemic heart disease and other diseases of the circulatory system: Secondary | ICD-10-CM | POA: Diagnosis not present

## 2020-12-06 DIAGNOSIS — Z800 Family history of malignant neoplasm of digestive organs: Secondary | ICD-10-CM | POA: Diagnosis not present

## 2020-12-06 DIAGNOSIS — Z801 Family history of malignant neoplasm of trachea, bronchus and lung: Secondary | ICD-10-CM | POA: Diagnosis not present

## 2020-12-06 DIAGNOSIS — Z833 Family history of diabetes mellitus: Secondary | ICD-10-CM | POA: Diagnosis not present

## 2020-12-06 MED ORDER — OXYCODONE HCL 5 MG PO TABS
ORAL_TABLET | ORAL | Status: AC
  Administered 2020-12-06: 5 mg via ORAL
  Filled 2020-12-06: qty 1

## 2020-12-06 MED ORDER — ACETAMINOPHEN 500 MG PO TABS
ORAL_TABLET | ORAL | Status: AC
  Administered 2020-12-06: 1000 mg via ORAL
  Filled 2020-12-06: qty 2

## 2020-12-06 MED ORDER — KETOROLAC TROMETHAMINE 15 MG/ML IJ SOLN
INTRAMUSCULAR | Status: AC
  Administered 2020-12-06: 7.5 mg via INTRAVENOUS
  Filled 2020-12-06: qty 1

## 2020-12-06 MED ORDER — GABAPENTIN 100 MG PO CAPS
ORAL_CAPSULE | ORAL | Status: AC
  Administered 2020-12-06: 100 mg via ORAL
  Filled 2020-12-06: qty 1

## 2020-12-06 NOTE — Progress Notes (Signed)
Nsg Discharge Note  Admit Date:  12/05/2020 Discharge date: 12/06/2020   [name] to be D/C'd Home per MD order.  AVS completed.   Patient/caregiver able to verbalize understanding.  Discharge Medication: Allergies as of 12/06/2020      Reactions   Sulfa Antibiotics Anaphylaxis, Hives, Itching, Shortness Of Breath, Swelling   Lips & throat swelled   Nitrofuran Derivatives Swelling   Lip swelling; "eyes get red and puffy"   Amlodipine Besylate Other (See Comments)   Gas, bloating  Gas, bloating    Nitrofurantoin Hives, Other (See Comments)      Medication List    TAKE these medications   atorvastatin 10 MG tablet Commonly known as: LIPITOR Take 1 tablet (10 mg total) by mouth every other day. Note 10 mg no need to cut in 1/2 as before you have 20 mg taking 1/2 pill =10 mg   Cholecalciferol 125 MCG (5000 UT) capsule Take 5,000 Units by mouth daily.   [employer] CALCIUM 600 & VITAMIN D3 PO Take 1 tablet by mouth daily.   ergocalciferol 1.25 MG (50000 UT) capsule Commonly known as: VITAMIN D2 Take 50,000 Units by mouth every Friday.   fluticasone 50 MCG/ACT nasal spray Commonly known as: FLONASE Place 2 sprays into both nostrils daily. Max b/l nostrils What changed:   when to take this  reasons to take this   gabapentin 100 MG capsule Commonly known as: NEURONTIN Take 100 mg by mouth 3 (three) times daily.   levothyroxine 88 MCG tablet Commonly known as: SYNTHROID Take 1 tablet (88 mcg total) by mouth daily before breakfast. 30 minutes   lisinopril 40 MG tablet Commonly known as: ZESTRIL Take 1 tablet (40 mg total) by mouth daily.   metoprolol succinate 50 MG 24 hr tablet Commonly known as: TOPROL-XL Take 1 tablet (50 mg total) by mouth daily. Take with or immediately following a meal.   Multi-Vitamins Tabs Take 1 tablet by mouth daily.   oxyCODONE 5 MG immediate release tablet Commonly known as: Roxicodone Take 1-2 tablets (5-10 mg total) by mouth every 4  (four) hours as needed.   Potassium 99 MG Tabs Take 1 tablet by mouth daily.   zolpidem 5 MG tablet Commonly known as: AMBIEN Take 1 tablet (5 mg total) by mouth at bedtime as needed for sleep.       Discharge Assessment: Vitals:   12/06/20 0629 12/06/20 0700  BP: 118/62 122/65  Pulse: 84 86  Resp: 16 17  Temp:  97.6 F (36.4 C)  SpO2: 98% 96%   Skin clean, dry and intact without evidence of skin break down, no evidence of skin tears noted. IV catheter discontinued intact. Site without signs and symptoms of complications - no redness or edema noted at insertion site, patient denies c/o pain - only slight tenderness at site.  Dressing with slight pressure applied.  D/c Instructions-Education: Discharge instructions given to patient/family with verbalized understanding. D/c education completed with patient/family including follow up instructions, medication list, d/c activities limitations if indicated, with other d/c instructions as indicated by MD - patient able to verbalize understanding, all questions fully answered. Patient instructed to return to ED, call 911, or call MD for any changes in condition.  Patient escorted via [place], and D/C home via private auto.  Tresa Endo, RN 12/06/2020 11:03 AM

## 2020-12-06 NOTE — Discharge Summary (Signed)
Physician Discharge Summary  Patient ID: Maria Spencer MRN: [id number] DOB/AGE: 1946-02-08 75 y.o.  Admit date: 12/05/2020 Discharge date: 12/06/2020  Admission Diagnoses:  Tear of right gluteus medius tendon [S76.011A] Tear of right gluteus minimus tendon  Discharge Diagnoses: Patient Active Problem List   Diagnosis Date Noted  . Tear of right gluteus medius tendon 12/05/2020  . Chronic bursitis of shoulder area (Right) 11/09/2020  . Chronic shoulder pain (Right) 11/09/2020  . Shoulder blade pain (Right) 11/09/2020  . Scoliosis of thoracic spine, unspecified scoliosis type 11/08/2020  . 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/20/2017) 11/08/2020  . Spondylosis without myelopathy or radiculopathy, cervical region 10/26/2020  . Abnormal MRI, cervical spine (09/26/2020) 10/23/2020  . Cervical facet syndrome (Bilateral) 10/23/2020  . Cervicogenic headache (Bilateral) 10/23/2020  . Occipital neuralgia (greater occipital nerve) (Bilateral) 10/23/2020  . Cervico-occipital neuralgia (Bilateral) 10/23/2020  . Cervical facet hypertrophy (Multilevel) (Bilateral) 10/23/2020  . Tear of right gluteus minimus tendon 10/06/2020  . Osteoarthritis of AC (acromioclavicular) joints (Bilateral) 10/05/2020  . Chronic Acromioclavicular (AC) joint pain (Bilateral) 10/05/2020  . Osteoarthritis of glenohumeral joint (Left) 10/05/2020  . Chronic shoulder pain (Bilateral) (R>L) 10/02/2020  . Chronic shoulder pain after shoulder replacement (Right) 10/02/2020  . Cervicalgia 09/13/2020  . Chronic upper extremity pain (Right) 09/13/2020  . Cervical spondylosis with radiculopathy (Right) 09/13/2020  . Cervical radiculopathy at C7 (Right) 09/13/2020  . Atypical chest pain 07/13/2020  . 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 Exertional dyspnea 07/13/2020  . Palpitation 07/13/2020  . GERD (gastroesophageal reflux disease) 07/13/2020  . Insomnia 07/11/2020  . Breast cancer ([place]) 06/22/2020  .  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) 06/22/2020  . DDD (degenerative disc disease), cervical 06/22/2020  . DDD (degenerative disc disease), thoracic 06/22/2020  . DDD (degenerative disc disease), lumbar 06/22/2020  . Displacement of thoracic intervertebral disc (C6-T3, T4-5, T6-7, T8-9) 06/22/2020  . Non-traumatic compression fracture of T8 thoracic vertebra, sequela 06/22/2020  . Spondylosis without myelopathy or radiculopathy, thoracic region 05/11/2020  . Closed wedge compression fracture of T8 vertebra ([place]) 05/11/2020  . Osteoarthritis of hips (Bilateral) 05/11/2020  . Abnormal thoracolumbar CT myelogram (01/05/2020) 05/11/2020  . Vitamin D deficiency 05/11/2020  . Chronic pain syndrome 05/08/2020  . Colon cancer screening 05/08/2020  . Disorder of skeletal system 05/08/2020  . Problems influencing health status 05/08/2020  . Chronic lower extremity pain (3ry area of Pain) (Right) 05/08/2020  . Chronic knee pain (4th area of Pain) (Posterior) (Left) 05/08/2020  . Thoracic facet syndrome (Bilateral) 05/08/2020  . Chronic sacroiliac joint pain (Bilateral) 05/08/2020  . Osteoporosis 01/14/2020  . Chronic low back pain with sciatica 01/14/2020  . Chronic thoracic back pain (1ry area of Pain) (Bilateral) (R>L) 01/14/2020  . Fusion of spine of lumbosacral region 12/13/2019  . Lumbosacral spondylosis with radiculopathy 12/13/2019  . S/P LASIK (laser assisted in situ keratomileusis) of both eyes 11/01/2019  . Aortic atherosclerosis ([place]) 09/17/2019  . Chronic hip pain (2ry area of Pain) (Bilateral) (R>L) 09/13/2019  . Age-related nuclear cataract of right eye 08/19/2019  . Age-related nuclear cataract of left eye 08/19/2019  . 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 Arthritis 01/07/2018  . Chronic UTI 12/31/2017  . IBS (irritable bowel syndrome) 12/29/2017  . History of breast cancer 12/29/2017  . Baker's cyst of knee, left 12/29/2017   . Scoliosis of cervical region due to degenerative disease of spine in adult 09/18/2017  . Hypothyroidism 02/15/2013  . Hyperlipidemia 02/15/2013  .  Elevated blood pressure reading with diagnosis of hypertension 02/15/2013  . Essential hypertension 02/15/2013    Past Medical History:  Diagnosis Date  . Arthritis    neck, back, left knee;   . Breast cancer [facility]) 2009   right lumpectomy s/p radiation   . Chicken pox   . Chronic UTI    established with urology   . Diverticular disease    -osis and -itis   . Esophagitis    egd 05/17/14 see report scanned into chart  . History of kidney stones   . Hormone disorder   . Hyperlipidemia   . Hypertension    controlled well with medication;   . Hypothyroidism   . Osteoporosis   . Personal history of radiation therapy 2009   F/U right breast cancer  . Thyroid disease    hypothyroidism      Transfusion: None.   Consultants (if any):   Discharged Condition: Improved  Hospital Course: Maria Spencer is an 75 y.o. 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    Surgeries: Procedure(s): OPEN REPAIR OF RIGHT GLUTEUS MINIMUS TENDON on 12/05/2020 Patient tolerated the surgery well. Taken to PACU where she was stabilized and held in the post-op recovery area overnight for observation.  Started on Lovenox 40mg  q 24 hrs. Foot pumps applied bilaterally at 80 mm. Heels elevated on bed with rolled towels. No evidence of DVT. Negative [place]. Physical therapy started on day #1 for gait training and transfer  Patient's IV was d/c on POD1.  Implants: [employer] 2.8 mm Q-Fix anchors x4, [place] QuatroLink anchors x2  She was given perioperative antibiotics:  Anti-infectives (From admission, onward)   Start     Dose/Rate Route Frequency Ordered Stop   12/05/20 0902  ceFAZolin  (ANCEF) 2-4 GM/100ML-% IVPB       Note to Pharmacy: Gardiner Sleeper   : cabinet override      12/05/20 0902 12/05/20 1024   12/05/20 0600  ceFAZolin (ANCEF) IVPB 2g/100 mL premix        2 g 200 mL/hr over 30 Minutes Intravenous On call to O.R. 12/05/20 0357 12/05/20 1050    .  She was given sequential compression devices, early ambulation, and Lovenox for DVT prophylaxis.  She benefited maximally from the hospital stay and there were no complications.    Recent vital signs:  Vitals:   12/06/20 0629 12/06/20 0700  BP: 118/62 122/65  Pulse: 84 86  Resp: 16 17  Temp:  97.6 F (36.4 C)  SpO2: 98% 96%    Recent laboratory studies:  Lab Results  Component Value Date   HGB 13.7 01/05/2020   HGB 13.4 09/10/2019   HGB 11.6 (L) 10/10/2018   Lab Results  Component Value Date   WBC 4.8 01/05/2020   PLT 211 01/05/2020   Lab Results  Component Value Date   INR 0.9 01/05/2020   Lab Results  Component Value Date   NA 138 05/08/2020   K 4.0 05/08/2020   CL 102 05/08/2020   CO2 26 05/08/2020   BUN 24 (H) 05/08/2020   CREATININE 0.54 05/08/2020   GLUCOSE 102 (H) 05/08/2020    Discharge Medications:   Allergies as of 12/06/2020      Reactions   Sulfa Antibiotics Anaphylaxis, Hives, Itching, Shortness Of Breath, Swelling   Lips & throat  swelled   Nitrofuran Derivatives Swelling   Lip swelling; "eyes get red and puffy"   Amlodipine Besylate Other (See Comments)   Gas, bloating  Gas, bloating    Nitrofurantoin Hives, Other (See Comments)      Medication List    TAKE these medications   atorvastatin 10 MG tablet Commonly known as: LIPITOR Take 1 tablet (10 mg total) by mouth every other day. Note 10 mg no need to cut in 1/2 as before you have 20 mg taking 1/2 pill =10 mg   Cholecalciferol 125 MCG (5000 UT) capsule Take 5,000 Units by mouth daily.   [employer] CALCIUM 600 & VITAMIN D3 PO Take 1 tablet by mouth daily.   ergocalciferol 1.25 MG (50000 UT) capsule Commonly  known as: VITAMIN D2 Take 50,000 Units by mouth every Friday.   fluticasone 50 MCG/ACT nasal spray Commonly known as: FLONASE Place 2 sprays into both nostrils daily. Max b/l nostrils What changed:   when to take this  reasons to take this   gabapentin 100 MG capsule Commonly known as: NEURONTIN Take 100 mg by mouth 3 (three) times daily.   levothyroxine 88 MCG tablet Commonly known as: SYNTHROID Take 1 tablet (88 mcg total) by mouth daily before breakfast. 30 minutes   lisinopril 40 MG tablet Commonly known as: ZESTRIL Take 1 tablet (40 mg total) by mouth daily.   metoprolol succinate 50 MG 24 hr tablet Commonly known as: TOPROL-XL Take 1 tablet (50 mg total) by mouth daily. Take with or immediately following a meal.   Multi-Vitamins Tabs Take 1 tablet by mouth daily.   oxyCODONE 5 MG immediate release tablet Commonly known as: Roxicodone Take 1-2 tablets (5-10 mg total) by mouth every 4 (four) hours as needed.   Potassium 99 MG Tabs Take 1 tablet by mouth daily.   zolpidem 5 MG tablet Commonly known as: AMBIEN Take 1 tablet (5 mg total) by mouth at bedtime as needed for sleep.      Diagnostic Studies: MR HIP LEFT WO CONTRAST  Result Date: 11/08/2020 CLINICAL DATA:  Left hip and buttock pain for 2 months. Right gluteus minimus tendon tear EXAM: MR OF THE LEFT HIP WITHOUT CONTRAST TECHNIQUE: Multiplanar, multisequence MR imaging was performed. No intravenous contrast was administered. COMPARISON:  MRI 09/26/2020 FINDINGS: Bones: No acute fracture. No dislocation. No femoral head avascular necrosis. Status post lumbosacral fusion with fusion screws traversing the bilateral SI joints. Advanced arthropathy of the pubic symphysis. No marrow replacing lesion. Articular cartilage and labrum Articular cartilage: Mild-moderate chondral thinning and surface irregularity of the left hip joint. There are degenerative subchondral marrow signal changes within the acetabulum.  Findings are fairly symmetric to the contralateral right hip. Labrum: Extensive degenerative tearing of the labrum of the left hip. Multilobulated cystic structure along the anterior inferior aspect of the left acetabulum measuring up to 2.9 x 1.2 cm, likely paralabral cyst (series 5, image 8; series 4, image 14). Visualized labrum of the right hip appears extensively degenerated. Joint or bursal effusion Joint effusion:  No joint effusion. Bursae: Large right peritrochanteric bursal fluid collection measuring 7.3 x 2.5 x 3.0 cm (series 3, image 19). No left peritrochanteric bursal fluid. Muscles and tendons Muscles and tendons: There is a new full-thickness tear of the right gluteus medius tendon with approximately 1.3 cm of retraction. 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 Left gluteal tendons are intact. Tendinosis of the bilateral hamstring tendon origins with partial-thickness tear on the left (series 3,  image 19). Chronic atrophy and fatty infiltration of the right gluteus medius and minimus muscles. Left gluteus minimus muscle is mildly atrophic. Other findings Miscellaneous:   Colonic diverticulosis. IMPRESSION: 1. No acute osseous abnormality of the left hip. 2. 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 3. New full-thickness tear of the RIGHT gluteus medius tendon with 1.3 cm of retraction. 4. Large RIGHT peritrochanteric bursal fluid collection. 5. Tendinosis of the bilateral hamstring tendon origins with partial-thickness tear on the left. Electronically Signed   By: Davina Poke D.O.   On: 11/08/2020 08:33   Disposition: Discharge home today with HHPT following working with PT today.   Follow-up Information    Poggi, Marshall Cork, MD Follow up.   Specialty: Orthopedic Surgery Why: as scheduled Contact information: [address] [address] [phone]              Signed: Judson Roch PA-C 12/06/2020, 9:47 AM

## 2020-12-06 NOTE — Progress Notes (Signed)
Physical Therapy Treatment Patient Details Name: Maria Spencer MRN: [id number] DOB: 1946/04/13 Today's Date: 12/06/2020    History of Present Illness 75 y/o female here repair of right gluteus minimus and gluteus medius tendon tears 12/05/20.    PT Comments    Pt with greatly improved mobility, ambulation, safety, confidence, etc today.  She was able to get in/out of bed w/o direct assist, stood and maintain balance with good NWBing maintenance and safety and managed to do prolonged bout of ambulation in hallway with good tolerance (vitals stable) and showed much better ability to safely manage at home.      Follow Up Recommendations  Home health PT;Follow surgeon's recommendation for DC plan and follow-up therapies;Supervision/Assistance - 24 hour (did discussed possibility of having home health aides)     Equipment Recommendations       Recommendations for Other Services       Precautions / Restrictions Precautions Precautions: Fall Required Braces or Orthoses: Other Brace (hip ABd) Restrictions Weight Bearing Restrictions: Yes RLE Weight Bearing: Touchdown weight bearing    Mobility  Bed Mobility Overal bed mobility: Modified Independent             General bed mobility comments: Pt was able to use UEs on brace to assist with getting R LE in/out of bed.  Pt needing some extra time but able to manage w/o physical assist  Transfers Overall transfer level: Modified independent Equipment used: Rolling walker (2 wheeled) Transfers: Sit to/from Stand Sit to Stand: Min guard         General transfer comment: Pt able to get up to standing on mutiple attempts from multiple height surfaces with much improved confidence today.  She continues to do a good job maintaining [place].  Ambulation/Gait Ambulation/Gait assistance: Min assist Gait Distance (Feet): 75 Feet Assistive device: Rolling walker (2 wheeled)       General Gait Details: Pt with greatly improved  safety, tolerance and confidence with ambulation this date.  She easily managed ambulation down the hall, multiple turns, etc all with vitals remaining WNL and only minimal c/o fatigue on return to bed.   Stairs             Wheelchair Mobility    Modified Rankin (Stroke Patients Only)       Balance Overall balance assessment: Needs assistance   Sitting balance-Leahy Scale: Good       Standing balance-Leahy Scale: Good Standing balance comment: Pt with good static and dynamic standing balance with walker.  She was able to maintain NWBing w/o issue.                            Cognition Arousal/Alertness: Awake/alert Behavior During Therapy: WFL for tasks assessed/performed Overall Cognitive Status: Within Functional Limits for tasks assessed                                        Exercises      General Comments        Pertinent Vitals/Pain Pain Assessment: 0-10 Pain Score: 3  Pain Location: minimal pain t/o session, reports sciatic pain is gone which made her tolerance much better today    Home Living                      Prior Function  PT Goals (current goals can now be found in the care plan section) Progress towards PT goals: Progressing toward goals    Frequency    7X/week      PT Plan Current plan remains appropriate    Co-evaluation              AM-PAC PT "6 Clicks" Mobility   Outcome Measure  Help needed turning from your back to your side while in a flat bed without using bedrails?: None Help needed moving from lying on your back to sitting on the side of a flat bed without using bedrails?: A Little Help needed moving to and from a bed to a chair (including a wheelchair)?: A Little Help needed standing up from a chair using your arms (e.g., wheelchair or bedside chair)?: A Little Help needed to walk in hospital room?: A Little Help needed climbing 3-5 steps with a railing? : A  Lot 6 Click Score: 18    End of Session Equipment Utilized During Treatment: Gait belt Activity Tolerance: Patient limited by pain;Patient limited by fatigue Patient left: in chair;with call bell/phone within reach;with nursing/sitter in room;with family/visitor present   PT Visit Diagnosis: Muscle weakness (generalized) (M62.81);Difficulty in walking, not elsewhere classified (R26.2);Pain;Unsteadiness on feet (R26.81) Pain - Right/Left: Right Pain - part of body: Hip     Time: 0350-0938 PT Time Calculation (min) (ACUTE ONLY): 28 min  Charges:  $Gait Training: 8-22 mins $Therapeutic Exercise: 8-22 mins                     Kreg Shropshire, DPT 12/06/2020, 11:35 AM

## 2020-12-06 NOTE — Progress Notes (Signed)
Subjective: 1 Day Post-Op Procedure(s) (LRB): OPEN REPAIR OF RIGHT GLUTEUS MINIMUS TENDON (Right) Patient reports pain as mild.   Patient is well, and has had no acute complaints or problems Plan is to go Home after hospital stay. Negative for chest pain and shortness of breath Fever: no Gastrointestinal:Negative for nausea and vomiting  Objective: Vital signs in last 24 hours: Temp:  [97 F (36.1 C)-98.4 F (36.9 C)] 97.6 F (36.4 C) (01/12 0700) Pulse Rate:  [63-98] 86 (01/12 0700) Resp:  [10-32] 17 (01/12 0700) BP: (102-139)/(50-112) 122/65 (01/12 0700) SpO2:  [93 %-100 %] 96 % (01/12 0700)  Intake/Output from previous day:  Intake/Output Summary (Last 24 hours) at 12/06/2020 0942 Last data filed at 12/06/2020 0020 Gross per 24 hour  Intake 1380 ml  Output 940 ml  Net 440 ml    Intake/Output this shift: No intake/output data recorded.  Labs: No results for input(s): HGB in the last 72 hours. No results for input(s): WBC, RBC, HCT, PLT in the last 72 hours. No results for input(s): NA, K, CL, CO2, BUN, CREATININE, GLUCOSE, CALCIUM in the last 72 hours. No results for input(s): LABPT, INR in the last 72 hours.   EXAM General - Patient is Alert, Appropriate and Oriented Extremity - ABD soft Sensation intact distally Dorsiflexion/Plantar flexion intact Incision: moderate drainage Dressing/Incision - blood tinged drainage, new honeycomb dressing applied to the right hip this AM.  Hip Spica brace intact to the right leg. Motor Function - intact, moving foot and toes well on exam.  Abdomen soft with normal bowel sounds.  Past Medical History:  Diagnosis Date  . Arthritis    neck, back, left knee;   . Breast cancer [facility]) 2009   right lumpectomy s/p radiation   . Chicken pox   . Chronic UTI    established with urology   . Diverticular disease    -osis and -itis   . Esophagitis    egd 05/17/14 see report scanned into chart  . History of kidney stones   . Hormone  disorder   . Hyperlipidemia   . Hypertension    controlled well with medication;   . Hypothyroidism   . Osteoporosis   . Personal history of radiation therapy 2009   F/U right breast cancer  . Thyroid disease    hypothyroidism     Assessment/Plan: 1 Day Post-Op Procedure(s) (LRB): OPEN REPAIR OF RIGHT GLUTEUS MINIMUS TENDON (Right) Active Problems:   Tear of right gluteus medius tendon  Estimated body mass index is 26.05 kg/m as calculated from the following:   Height as of this encounter: 4\' 11"  (1.499 m).   Weight as of this encounter: 58.5 kg. Advance diet Up with therapy D/C IV fluids when tolerating po intake this AM.  Vitals stable. Patient is passing gas without pain. Continue with PT today.  Continue toe touch weightbearing to the right leg. Plan for discharge home this afternoon.  DVT Prophylaxis - Lovenox and TED hose Toe touch weightbearing to the right leg.  Raquel Deckard Stuber, PA-C [facility] 12/06/2020, 9:42 AM

## 2020-12-07 DIAGNOSIS — E785 Hyperlipidemia, unspecified: Secondary | ICD-10-CM | POA: Diagnosis not present

## 2020-12-07 DIAGNOSIS — K219 Gastro-esophageal reflux disease without esophagitis: Secondary | ICD-10-CM | POA: Diagnosis not present

## 2020-12-07 DIAGNOSIS — M7551 Bursitis of right shoulder: Secondary | ICD-10-CM | POA: Diagnosis not present

## 2020-12-07 DIAGNOSIS — K209 Esophagitis, unspecified without bleeding: Secondary | ICD-10-CM | POA: Diagnosis not present

## 2020-12-07 DIAGNOSIS — M4135 Thoracogenic scoliosis, thoracolumbar region: Secondary | ICD-10-CM | POA: Diagnosis not present

## 2020-12-07 DIAGNOSIS — M4727 Other spondylosis with radiculopathy, lumbosacral region: Secondary | ICD-10-CM | POA: Diagnosis not present

## 2020-12-07 DIAGNOSIS — M160 Bilateral primary osteoarthritis of hip: Secondary | ICD-10-CM | POA: Diagnosis not present

## 2020-12-07 DIAGNOSIS — J449 Chronic obstructive pulmonary disease, unspecified: Secondary | ICD-10-CM | POA: Diagnosis not present

## 2020-12-07 DIAGNOSIS — S76011D Strain of muscle, fascia and tendon of right hip, subsequent encounter: Secondary | ICD-10-CM | POA: Diagnosis not present

## 2020-12-07 DIAGNOSIS — M5030 Other cervical disc degeneration, unspecified cervical region: Secondary | ICD-10-CM | POA: Diagnosis not present

## 2020-12-07 DIAGNOSIS — M7122 Synovial cyst of popliteal space [Baker], left knee: Secondary | ICD-10-CM | POA: Diagnosis not present

## 2020-12-07 DIAGNOSIS — G894 Chronic pain syndrome: Secondary | ICD-10-CM | POA: Diagnosis not present

## 2020-12-07 DIAGNOSIS — M5117 Intervertebral disc disorders with radiculopathy, lumbosacral region: Secondary | ICD-10-CM | POA: Diagnosis not present

## 2020-12-07 DIAGNOSIS — M1990 Unspecified osteoarthritis, unspecified site: Secondary | ICD-10-CM | POA: Diagnosis not present

## 2020-12-07 DIAGNOSIS — I700 Atherosclerosis of aorta: Secondary | ICD-10-CM | POA: Diagnosis not present

## 2020-12-07 DIAGNOSIS — M47814 Spondylosis without myelopathy or radiculopathy, thoracic region: Secondary | ICD-10-CM | POA: Diagnosis not present

## 2020-12-07 DIAGNOSIS — M810 Age-related osteoporosis without current pathological fracture: Secondary | ICD-10-CM | POA: Diagnosis not present

## 2020-12-07 DIAGNOSIS — Z8673 Personal history of transient ischemic attack (TIA), and cerebral infarction without residual deficits: Secondary | ICD-10-CM | POA: Diagnosis not present

## 2020-12-07 DIAGNOSIS — M4722 Other spondylosis with radiculopathy, cervical region: Secondary | ICD-10-CM | POA: Diagnosis not present

## 2020-12-07 DIAGNOSIS — G4700 Insomnia, unspecified: Secondary | ICD-10-CM | POA: Diagnosis not present

## 2020-12-07 DIAGNOSIS — M5114 Intervertebral disc disorders with radiculopathy, thoracic region: Secondary | ICD-10-CM | POA: Diagnosis not present

## 2020-12-07 DIAGNOSIS — E039 Hypothyroidism, unspecified: Secondary | ICD-10-CM | POA: Diagnosis not present

## 2020-12-07 DIAGNOSIS — K589 Irritable bowel syndrome without diarrhea: Secondary | ICD-10-CM | POA: Diagnosis not present

## 2020-12-07 DIAGNOSIS — M5441 Lumbago with sciatica, right side: Secondary | ICD-10-CM | POA: Diagnosis not present

## 2020-12-07 DIAGNOSIS — I10 Essential (primary) hypertension: Secondary | ICD-10-CM | POA: Diagnosis not present

## 2020-12-12 DIAGNOSIS — I10 Essential (primary) hypertension: Secondary | ICD-10-CM | POA: Diagnosis not present

## 2020-12-12 DIAGNOSIS — E039 Hypothyroidism, unspecified: Secondary | ICD-10-CM | POA: Diagnosis not present

## 2020-12-12 DIAGNOSIS — E785 Hyperlipidemia, unspecified: Secondary | ICD-10-CM | POA: Diagnosis not present

## 2020-12-12 DIAGNOSIS — S76011D Strain of muscle, fascia and tendon of right hip, subsequent encounter: Secondary | ICD-10-CM | POA: Diagnosis not present

## 2020-12-12 DIAGNOSIS — M810 Age-related osteoporosis without current pathological fracture: Secondary | ICD-10-CM | POA: Diagnosis not present

## 2020-12-12 DIAGNOSIS — J449 Chronic obstructive pulmonary disease, unspecified: Secondary | ICD-10-CM | POA: Diagnosis not present

## 2020-12-13 DIAGNOSIS — J449 Chronic obstructive pulmonary disease, unspecified: Secondary | ICD-10-CM | POA: Diagnosis not present

## 2020-12-13 DIAGNOSIS — S76011D Strain of muscle, fascia and tendon of right hip, subsequent encounter: Secondary | ICD-10-CM | POA: Diagnosis not present

## 2020-12-13 DIAGNOSIS — E039 Hypothyroidism, unspecified: Secondary | ICD-10-CM | POA: Diagnosis not present

## 2020-12-13 DIAGNOSIS — E785 Hyperlipidemia, unspecified: Secondary | ICD-10-CM | POA: Diagnosis not present

## 2020-12-13 DIAGNOSIS — I10 Essential (primary) hypertension: Secondary | ICD-10-CM | POA: Diagnosis not present

## 2020-12-13 DIAGNOSIS — M810 Age-related osteoporosis without current pathological fracture: Secondary | ICD-10-CM | POA: Diagnosis not present

## 2020-12-18 DIAGNOSIS — M1712 Unilateral primary osteoarthritis, left knee: Secondary | ICD-10-CM | POA: Diagnosis not present

## 2020-12-28 DIAGNOSIS — J449 Chronic obstructive pulmonary disease, unspecified: Secondary | ICD-10-CM | POA: Diagnosis not present

## 2020-12-28 DIAGNOSIS — E785 Hyperlipidemia, unspecified: Secondary | ICD-10-CM | POA: Diagnosis not present

## 2020-12-28 DIAGNOSIS — I10 Essential (primary) hypertension: Secondary | ICD-10-CM | POA: Diagnosis not present

## 2020-12-28 DIAGNOSIS — M810 Age-related osteoporosis without current pathological fracture: Secondary | ICD-10-CM | POA: Diagnosis not present

## 2020-12-28 DIAGNOSIS — S76011D Strain of muscle, fascia and tendon of right hip, subsequent encounter: Secondary | ICD-10-CM | POA: Diagnosis not present

## 2020-12-28 DIAGNOSIS — E039 Hypothyroidism, unspecified: Secondary | ICD-10-CM | POA: Diagnosis not present

## 2021-01-05 DIAGNOSIS — I10 Essential (primary) hypertension: Secondary | ICD-10-CM | POA: Diagnosis not present

## 2021-01-05 DIAGNOSIS — E785 Hyperlipidemia, unspecified: Secondary | ICD-10-CM | POA: Diagnosis not present

## 2021-01-05 DIAGNOSIS — M810 Age-related osteoporosis without current pathological fracture: Secondary | ICD-10-CM | POA: Diagnosis not present

## 2021-01-05 DIAGNOSIS — E039 Hypothyroidism, unspecified: Secondary | ICD-10-CM | POA: Diagnosis not present

## 2021-01-05 DIAGNOSIS — J449 Chronic obstructive pulmonary disease, unspecified: Secondary | ICD-10-CM | POA: Diagnosis not present

## 2021-01-05 DIAGNOSIS — S76011D Strain of muscle, fascia and tendon of right hip, subsequent encounter: Secondary | ICD-10-CM | POA: Diagnosis not present

## 2021-01-06 DIAGNOSIS — M47814 Spondylosis without myelopathy or radiculopathy, thoracic region: Secondary | ICD-10-CM | POA: Diagnosis not present

## 2021-01-06 DIAGNOSIS — M1990 Unspecified osteoarthritis, unspecified site: Secondary | ICD-10-CM | POA: Diagnosis not present

## 2021-01-06 DIAGNOSIS — M5030 Other cervical disc degeneration, unspecified cervical region: Secondary | ICD-10-CM | POA: Diagnosis not present

## 2021-01-06 DIAGNOSIS — M4135 Thoracogenic scoliosis, thoracolumbar region: Secondary | ICD-10-CM | POA: Diagnosis not present

## 2021-01-06 DIAGNOSIS — G894 Chronic pain syndrome: Secondary | ICD-10-CM | POA: Diagnosis not present

## 2021-01-06 DIAGNOSIS — E039 Hypothyroidism, unspecified: Secondary | ICD-10-CM | POA: Diagnosis not present

## 2021-01-06 DIAGNOSIS — M5441 Lumbago with sciatica, right side: Secondary | ICD-10-CM | POA: Diagnosis not present

## 2021-01-06 DIAGNOSIS — M810 Age-related osteoporosis without current pathological fracture: Secondary | ICD-10-CM | POA: Diagnosis not present

## 2021-01-06 DIAGNOSIS — M5117 Intervertebral disc disorders with radiculopathy, lumbosacral region: Secondary | ICD-10-CM | POA: Diagnosis not present

## 2021-01-06 DIAGNOSIS — S76011D Strain of muscle, fascia and tendon of right hip, subsequent encounter: Secondary | ICD-10-CM | POA: Diagnosis not present

## 2021-01-06 DIAGNOSIS — I10 Essential (primary) hypertension: Secondary | ICD-10-CM | POA: Diagnosis not present

## 2021-01-06 DIAGNOSIS — M4727 Other spondylosis with radiculopathy, lumbosacral region: Secondary | ICD-10-CM | POA: Diagnosis not present

## 2021-01-06 DIAGNOSIS — I700 Atherosclerosis of aorta: Secondary | ICD-10-CM | POA: Diagnosis not present

## 2021-01-06 DIAGNOSIS — E785 Hyperlipidemia, unspecified: Secondary | ICD-10-CM | POA: Diagnosis not present

## 2021-01-06 DIAGNOSIS — M160 Bilateral primary osteoarthritis of hip: Secondary | ICD-10-CM | POA: Diagnosis not present

## 2021-01-06 DIAGNOSIS — G4700 Insomnia, unspecified: Secondary | ICD-10-CM | POA: Diagnosis not present

## 2021-01-06 DIAGNOSIS — M5114 Intervertebral disc disorders with radiculopathy, thoracic region: Secondary | ICD-10-CM | POA: Diagnosis not present

## 2021-01-06 DIAGNOSIS — J449 Chronic obstructive pulmonary disease, unspecified: Secondary | ICD-10-CM | POA: Diagnosis not present

## 2021-01-06 DIAGNOSIS — M7551 Bursitis of right shoulder: Secondary | ICD-10-CM | POA: Diagnosis not present

## 2021-01-06 DIAGNOSIS — Z8673 Personal history of transient ischemic attack (TIA), and cerebral infarction without residual deficits: Secondary | ICD-10-CM | POA: Diagnosis not present

## 2021-01-06 DIAGNOSIS — M7122 Synovial cyst of popliteal space [Baker], left knee: Secondary | ICD-10-CM | POA: Diagnosis not present

## 2021-01-06 DIAGNOSIS — K219 Gastro-esophageal reflux disease without esophagitis: Secondary | ICD-10-CM | POA: Diagnosis not present

## 2021-01-06 DIAGNOSIS — K589 Irritable bowel syndrome without diarrhea: Secondary | ICD-10-CM | POA: Diagnosis not present

## 2021-01-06 DIAGNOSIS — M4722 Other spondylosis with radiculopathy, cervical region: Secondary | ICD-10-CM | POA: Diagnosis not present

## 2021-01-06 DIAGNOSIS — K209 Esophagitis, unspecified without bleeding: Secondary | ICD-10-CM | POA: Diagnosis not present

## 2021-01-08 DIAGNOSIS — J449 Chronic obstructive pulmonary disease, unspecified: Secondary | ICD-10-CM | POA: Diagnosis not present

## 2021-01-08 DIAGNOSIS — I10 Essential (primary) hypertension: Secondary | ICD-10-CM | POA: Diagnosis not present

## 2021-01-08 DIAGNOSIS — E785 Hyperlipidemia, unspecified: Secondary | ICD-10-CM | POA: Diagnosis not present

## 2021-01-08 DIAGNOSIS — M810 Age-related osteoporosis without current pathological fracture: Secondary | ICD-10-CM | POA: Diagnosis not present

## 2021-01-08 DIAGNOSIS — E039 Hypothyroidism, unspecified: Secondary | ICD-10-CM | POA: Diagnosis not present

## 2021-01-08 DIAGNOSIS — S76011D Strain of muscle, fascia and tendon of right hip, subsequent encounter: Secondary | ICD-10-CM | POA: Diagnosis not present

## 2021-01-11 ENCOUNTER — Ambulatory Visit: Payer: Medicare Other | Admitting: Internal Medicine

## 2021-01-18 ENCOUNTER — Ambulatory Visit (INDEPENDENT_AMBULATORY_CARE_PROVIDER_SITE_OTHER): Payer: Medicare Other | Admitting: Physician Assistant

## 2021-01-18 ENCOUNTER — Other Ambulatory Visit: Payer: Self-pay

## 2021-01-18 ENCOUNTER — Encounter: Payer: Self-pay | Admitting: Physician Assistant

## 2021-01-18 VITALS — BP 178/98 | HR 80 | Ht 59.0 in | Wt 134.0 lb

## 2021-01-18 DIAGNOSIS — N390 Urinary tract infection, site not specified: Secondary | ICD-10-CM

## 2021-01-18 DIAGNOSIS — R300 Dysuria: Secondary | ICD-10-CM | POA: Diagnosis not present

## 2021-01-18 LAB — MICROSCOPIC EXAMINATION

## 2021-01-18 LAB — URINALYSIS, COMPLETE
Bilirubin, UA: NEGATIVE
Glucose, UA: NEGATIVE
Ketones, UA: NEGATIVE
Leukocytes,UA: NEGATIVE
Nitrite, UA: POSITIVE — AB
Protein,UA: NEGATIVE
RBC, UA: NEGATIVE
Specific Gravity, UA: 1.015 (ref 1.005–1.030)
Urobilinogen, Ur: 0.2 mg/dL (ref 0.2–1.0)
pH, UA: 6.5 (ref 5.0–7.5)

## 2021-01-18 MED ORDER — FLUCONAZOLE 150 MG PO TABS: 150.0000 mg | ORAL_TABLET | Freq: Once | ORAL | 0 refills | Status: AC

## 2021-01-18 MED ORDER — CEPHALEXIN 250 MG PO CAPS: 250.0000 mg | ORAL_CAPSULE | Freq: Every day | ORAL | 1 refills | Status: DC

## 2021-01-18 MED ORDER — FOSFOMYCIN TROMETHAMINE 3 G PO PACK: 3.0000 g | PACK | Freq: Once | ORAL | 0 refills | Status: AC

## 2021-01-18 NOTE — Progress Notes (Signed)
01/18/2021 5:04 PM   Maria Spencer 01-13-1946 [id number]  CC: Chief Complaint  Patient presents with  . Dysuria   HPI: Maria Spencer is a 75 y.o. female with PMH recurrent UTI on suppressive Keflex unit 50 mg daily who presents today for evaluation of possible UTI.  Today she reports a 2-day history of lower abdominal cramping and dysuria.  She reports occasional yeast infections with antibiotic use.  Additionally, she requests a refill of her suppressive antibiotics.  In-office UA today positive for nitrites; urine microscopy with 6-10 WBCs/HPF.  PMH: Past Medical History:  Diagnosis Date  . Arthritis    neck, back, left knee;   . Breast cancer [facility]) 2009   right lumpectomy s/p radiation   . Chicken pox   . Chronic UTI    established with urology   . Diverticular disease    -osis and -itis   . Esophagitis    egd 05/17/14 see report scanned into chart  . History of kidney stones   . Hormone disorder   . Hyperlipidemia   . Hypertension    controlled well with medication;   . Hypothyroidism   . Osteoporosis   . Personal history of radiation therapy 2009   F/U right breast cancer  . Thyroid disease    hypothyroidism     Surgical History: Past Surgical History:  Procedure Laterality Date  . BONE GRAFT HIP ILIAC CREST     + cage left hip 10/23/17   . BREAST BIOPSY Left 2009   clip,benign  . BREAST BIOPSY Right 2009   +  . BREAST LUMPECTOMY Right 2009   2009 lumpectomy   . CARDIAC CATHETERIZATION     No stents; [facility] doesn't recall facility +42yrs ago  . CATARACT EXTRACTION, BILATERAL Bilateral 10/2019  . COLONOSCOPY WITH PROPOFOL N/A 05/15/2020   Procedure: COLONOSCOPY WITH PROPOFOL;  Surgeon: Lin Landsman, MD;  Location: [facility] ENDOSCOPY;  Service: Gastroenterology;  Laterality: N/A;  . EYE SURGERY     b/l cataracts sch 10/2019 in [place]  . HERNIA REPAIR    . INSERTION OF MESH N/A 05/29/2018   Procedure: INSERTION OF MESH;  Surgeon: Johnathan Hausen, MD;  Location: [facility] ORS;  Service: General;  Laterality: N/A;  . JOINT REPLACEMENT    . QUADRICEPS TENDON REPAIR Right 12/05/2020   Procedure: OPEN REPAIR OF RIGHT GLUTEUS MINIMUS TENDON;  Surgeon: Corky Mull, MD;  Location: [facility] ORS;  Service: Orthopedics;  Laterality: Right;  . SHOULDER SURGERY Left 2008, 2009   x2 rotator cuff surgeries left shoulders  . SPINAL FUSION  07/24/2020  . SPINE SURGERY  10/23/2017   spinal 09/2017 h/o scoliosis [facility] L4-S1 OLIF T10 to ililum fusion  . TONSILLECTOMY     age 9 y.o.   . TOTAL SHOULDER REPLACEMENT Right 2012  . TUBAL LIGATION    . VENTRAL HERNIA REPAIR N/A 05/29/2018   Procedure: LAPAROSCOPIC VENTRAL / [place];  Surgeon: Johnathan Hausen, MD;  Location: [facility] ORS;  Service: General;  Laterality: N/A;    Home Medications:  Allergies as of 01/18/2021      Reactions   Sulfa Antibiotics Anaphylaxis, Hives, Itching, Shortness Of Breath, Swelling   Lips & throat swelled   Nitrofuran Derivatives Swelling   Lip swelling; "eyes get red and puffy"   Amlodipine Besylate Other (See Comments)   Gas, bloating  Gas, bloating    Nitrofurantoin Hives, Other (See Comments)      Medication List  Accurate as of January 18, 2021  5:04 PM. If you have any questions, ask your nurse or doctor.        atorvastatin 10 MG tablet Commonly known as: LIPITOR Take 1 tablet (10 mg total) by mouth every other day. Note 10 mg no need to cut in 1/2 as before you have 20 mg taking 1/2 pill =10 mg   cephALEXin 250 MG capsule Commonly known as: KEFLEX Take 1 capsule (250 mg total) by mouth daily. Started by: Debroah Loop, PA-C   Cholecalciferol 125 MCG (5000 UT) capsule Take 5,000 Units by mouth daily.   [employer] CALCIUM 600 & VITAMIN D3 PO Take 1 tablet by mouth daily.   ergocalciferol 1.25 MG (50000 UT) capsule Commonly known as: VITAMIN D2 Take 50,000 Units by mouth every Friday.   fluconazole 150 MG tablet Commonly known as:  DIFLUCAN Take 1 tablet (150 mg total) by mouth once for 1 dose. Started by: Debroah Loop, PA-C   fluticasone 50 MCG/ACT nasal spray Commonly known as: FLONASE Place 2 sprays into both nostrils daily. Max b/l nostrils   fosfomycin 3 g Pack Commonly known as: MONUROL Take 3 g by mouth once for 1 dose. Started by: Debroah Loop, PA-C   levothyroxine 88 MCG tablet Commonly known as: SYNTHROID Take 1 tablet (88 mcg total) by mouth daily before breakfast. 30 minutes   lisinopril 40 MG tablet Commonly known as: ZESTRIL Take 1 tablet (40 mg total) by mouth daily.   metoprolol succinate 50 MG 24 hr tablet Commonly known as: TOPROL-XL Take 1 tablet (50 mg total) by mouth daily. Take with or immediately following a meal.   Multi-Vitamins Tabs Take 1 tablet by mouth daily.   oxyCODONE 5 MG immediate release tablet Commonly known as: Roxicodone Take 1-2 tablets (5-10 mg total) by mouth every 4 (four) hours as needed.   Potassium 99 MG Tabs Take 1 tablet by mouth daily.   zolpidem 5 MG tablet Commonly known as: AMBIEN Take 1 tablet (5 mg total) by mouth at bedtime as needed for sleep.       Allergies:  Allergies  Allergen Reactions  . Sulfa Antibiotics Anaphylaxis, Hives, Itching, Shortness Of Breath and Swelling    Lips & throat swelled  . Nitrofuran Derivatives Swelling    Lip swelling; "eyes get red and puffy"  . Amlodipine Besylate Other (See Comments)    Gas, bloating   Gas, bloating   . Nitrofurantoin Hives and Other (See Comments)    Family History: Family History  Problem Relation Age of Onset  . Arthritis Mother   . Heart disease Mother 66  . Hyperlipidemia Mother   . Hypertension Mother   . Coronary artery disease Mother 81  . Arthritis Father   . Diabetes Father   . Cancer Father        colon  . AAA (abdominal aortic aneurysm) Father   . Arthritis Sister   . Hyperlipidemia Sister   . Hypertension Sister   . Cancer Brother         lung, smoker  . Mental retardation Brother   . Drug abuse Daughter        overdose in 2018   . Arthritis Sister   . Hyperlipidemia Sister   . Bladder Cancer Neg Hx   . Kidney cancer Neg Hx   . Breast cancer Neg Hx     Social History:   reports that she has never smoked. She has never used smokeless tobacco. She reports previous  alcohol use of about 7.0 standard drinks of alcohol per week. She reports that she does not use drugs.  Physical Exam: BP (!) 178/98   Pulse 80   Ht 4\' 11"  (1.499 m)   Wt 134 lb (60.8 kg)   BMI 27.06 kg/m   Constitutional:  Alert and oriented, no acute distress, nontoxic appearing HEENT: [place], AT Cardiovascular: No clubbing, cyanosis, or edema Respiratory: Normal respiratory effort, no increased work of breathing Skin: No rashes, bruises or suspicious lesions Neurologic: Grossly intact, no focal deficits, moving all 4 extremities Psychiatric: Normal mood and affect  Laboratory Data: Results for orders placed or performed in visit on 01/18/21  Microscopic Examination   Urine  Result Value Ref Range   WBC, UA 6-10 (A) 0 - 5 /hpf   RBC 0-2 0 - 2 /hpf   Epithelial Cells (non renal) 0-10 0 - 10 /hpf   Renal Epithel, UA 0-10 (A) None seen /hpf   Bacteria, UA Few None seen/Few  Urinalysis, Complete  Result Value Ref Range   Specific Gravity, UA 1.015 1.005 - 1.030   pH, UA 6.5 5.0 - 7.5   Color, UA Yellow Yellow   Appearance Ur Clear Clear   Leukocytes,UA Negative Negative   Protein,UA Negative Negative/Trace   Glucose, UA Negative Negative   Ketones, UA Negative Negative   RBC, UA Negative Negative   Bilirubin, UA Negative Negative   Urobilinogen, Ur 0.2 0.2 - 1.0 mg/dL   Nitrite, UA Positive (A) Negative   Microscopic Examination See below:    Assessment & Plan:   1. Recurrent UTI UA notable for nitrites and pyuria despite suppressive antibiotic use.  Will treat empirically with fosfomycin and send for culture for further evaluation.   Refilling Keflex per patient request.  Providing 1 dose of Diflucan in case she develops candidiasis.  We discussed starting cranberry supplements, d-mannose, and lactobacillus containing probiotics to augment her UTI prevention regimen.  Written instructions provided today. - Urinalysis, Complete - CULTURE, URINE COMPREHENSIVE - fosfomycin (MONUROL) 3 g PACK; Take 3 g by mouth once for 1 dose.  Dispense: 3 g; Refill: 0 - fluconazole (DIFLUCAN) 150 MG tablet; Take 1 tablet (150 mg total) by mouth once for 1 dose.  Dispense: 1 tablet; Refill: 0 - cephALEXin (KEFLEX) 250 MG capsule; Take 1 capsule (250 mg total) by mouth daily.  Dispense: 90 capsule; Refill: 1   Return if symptoms worsen or fail to improve.  Debroah Loop, PA-C  [facility] [address] [place], [place] [address] [phone]

## 2021-01-18 NOTE — Patient Instructions (Signed)
Recurrent UTI Prevention Strategies  1. Start taking an over-the-counter cranberry supplement for urinary tract health. Take this once or twice daily on an empty stomach, e.g. right before bed. 2. Start taking an over-the-counter d-mannose supplement for UTI prevention. Take this as instructed on the bottle. 3. Start taking an over-the-counter probiotic, preferably containing lactobacillus. Take this daily.

## 2021-01-19 ENCOUNTER — Telehealth: Payer: Self-pay

## 2021-01-19 NOTE — Telephone Encounter (Signed)
Pt saw sam yesterday for UTI. She was prescribed fosfomycin but pt has a severe allergy to sulfa drugs, are we able to send in another antibiotic in the mean time awaiting culture results?

## 2021-01-19 NOTE — Telephone Encounter (Signed)
Patient aware and verbalized understanding. °

## 2021-01-19 NOTE — Telephone Encounter (Signed)
She will need to take the Keflex 250 mg, 2 tablets at a time twice daily until her urine culture results are available.

## 2021-01-22 LAB — CULTURE, URINE COMPREHENSIVE

## 2021-01-24 DIAGNOSIS — E039 Hypothyroidism, unspecified: Secondary | ICD-10-CM | POA: Diagnosis not present

## 2021-01-24 DIAGNOSIS — M810 Age-related osteoporosis without current pathological fracture: Secondary | ICD-10-CM | POA: Diagnosis not present

## 2021-01-24 DIAGNOSIS — J449 Chronic obstructive pulmonary disease, unspecified: Secondary | ICD-10-CM | POA: Diagnosis not present

## 2021-01-24 DIAGNOSIS — S76011D Strain of muscle, fascia and tendon of right hip, subsequent encounter: Secondary | ICD-10-CM | POA: Diagnosis not present

## 2021-01-24 DIAGNOSIS — E785 Hyperlipidemia, unspecified: Secondary | ICD-10-CM | POA: Diagnosis not present

## 2021-01-24 DIAGNOSIS — I10 Essential (primary) hypertension: Secondary | ICD-10-CM | POA: Diagnosis not present

## 2021-01-30 DIAGNOSIS — J449 Chronic obstructive pulmonary disease, unspecified: Secondary | ICD-10-CM | POA: Diagnosis not present

## 2021-01-30 DIAGNOSIS — S76011D Strain of muscle, fascia and tendon of right hip, subsequent encounter: Secondary | ICD-10-CM | POA: Diagnosis not present

## 2021-01-30 DIAGNOSIS — E785 Hyperlipidemia, unspecified: Secondary | ICD-10-CM | POA: Diagnosis not present

## 2021-01-30 DIAGNOSIS — M810 Age-related osteoporosis without current pathological fracture: Secondary | ICD-10-CM | POA: Diagnosis not present

## 2021-01-30 DIAGNOSIS — E039 Hypothyroidism, unspecified: Secondary | ICD-10-CM | POA: Diagnosis not present

## 2021-01-30 DIAGNOSIS — I10 Essential (primary) hypertension: Secondary | ICD-10-CM | POA: Diagnosis not present

## 2021-02-02 DIAGNOSIS — M25551 Pain in right hip: Secondary | ICD-10-CM | POA: Diagnosis not present

## 2021-02-05 DIAGNOSIS — M25551 Pain in right hip: Secondary | ICD-10-CM | POA: Diagnosis not present

## 2021-02-06 ENCOUNTER — Telehealth: Payer: Self-pay | Admitting: *Deleted

## 2021-02-06 ENCOUNTER — Encounter: Payer: Self-pay | Admitting: Internal Medicine

## 2021-02-06 ENCOUNTER — Ambulatory Visit (INDEPENDENT_AMBULATORY_CARE_PROVIDER_SITE_OTHER): Payer: Medicare Other | Admitting: Internal Medicine

## 2021-02-06 ENCOUNTER — Telehealth: Payer: Self-pay | Admitting: Internal Medicine

## 2021-02-06 ENCOUNTER — Other Ambulatory Visit: Payer: Self-pay

## 2021-02-06 VITALS — BP 140/92 | HR 86 | Temp 97.8°F | Resp 15 | Ht 59.0 in | Wt 132.6 lb

## 2021-02-06 DIAGNOSIS — E559 Vitamin D deficiency, unspecified: Secondary | ICD-10-CM | POA: Diagnosis not present

## 2021-02-06 DIAGNOSIS — R0602 Shortness of breath: Secondary | ICD-10-CM

## 2021-02-06 DIAGNOSIS — G4700 Insomnia, unspecified: Secondary | ICD-10-CM

## 2021-02-06 DIAGNOSIS — E785 Hyperlipidemia, unspecified: Secondary | ICD-10-CM

## 2021-02-06 DIAGNOSIS — I10 Essential (primary) hypertension: Secondary | ICD-10-CM

## 2021-02-06 DIAGNOSIS — K5792 Diverticulitis of intestine, part unspecified, without perforation or abscess without bleeding: Secondary | ICD-10-CM

## 2021-02-06 DIAGNOSIS — R739 Hyperglycemia, unspecified: Secondary | ICD-10-CM | POA: Diagnosis not present

## 2021-02-06 DIAGNOSIS — E039 Hypothyroidism, unspecified: Secondary | ICD-10-CM

## 2021-02-06 DIAGNOSIS — Z1231 Encounter for screening mammogram for malignant neoplasm of breast: Secondary | ICD-10-CM

## 2021-02-06 DIAGNOSIS — B370 Candidal stomatitis: Secondary | ICD-10-CM | POA: Diagnosis not present

## 2021-02-06 DIAGNOSIS — R0789 Other chest pain: Secondary | ICD-10-CM | POA: Diagnosis not present

## 2021-02-06 DIAGNOSIS — R1032 Left lower quadrant pain: Secondary | ICD-10-CM

## 2021-02-06 DIAGNOSIS — R7303 Prediabetes: Secondary | ICD-10-CM

## 2021-02-06 LAB — LIPID PANEL
Cholesterol: 201 mg/dL — ABNORMAL HIGH (ref 0–200)
HDL: 68.3 mg/dL (ref >39.00)
LDL Cholesterol: 109 mg/dL — ABNORMAL HIGH (ref 0–99)
NonHDL: 132.6
Total CHOL/HDL Ratio: 3
Triglycerides: 119 mg/dL (ref 0.0–149.0)
VLDL: 23.8 mg/dL (ref 0.0–40.0)

## 2021-02-06 LAB — CBC WITH DIFFERENTIAL/PLATELET
Basophils Absolute: 0.1 10*3/uL (ref 0.0–0.1)
Basophils Relative: 0.9 % (ref 0.0–3.0)
Eosinophils Absolute: 0.2 10*3/uL (ref 0.0–0.7)
Eosinophils Relative: 3.3 % (ref 0.0–5.0)
HCT: 39.4 % (ref 36.0–46.0)
Hemoglobin: 12.8 g/dL (ref 12.0–15.0)
Lymphocytes Relative: 14.1 % (ref 12.0–46.0)
Lymphs Abs: 1 10*3/uL (ref 0.7–4.0)
MCHC: 32.4 g/dL (ref 30.0–36.0)
MCV: 85.9 fl (ref 78.0–100.0)
Monocytes Absolute: 0.4 10*3/uL (ref 0.1–1.0)
Monocytes Relative: 5.4 % (ref 3.0–12.0)
Neutro Abs: 5.5 10*3/uL (ref 1.4–7.7)
Neutrophils Relative %: 76.3 % (ref 43.0–77.0)
Platelets: 288 10*3/uL (ref 150.0–400.0)
RBC: 4.59 Mil/uL (ref 3.87–5.11)
RDW: 15.1 % (ref 11.5–15.5)
WBC: 7.2 10*3/uL (ref 4.0–10.5)

## 2021-02-06 LAB — COMPREHENSIVE METABOLIC PANEL
ALT: 12 U/L (ref 0–35)
AST: 17 U/L (ref 0–37)
Albumin: 4.1 g/dL (ref 3.5–5.2)
Alkaline Phosphatase: 80 U/L (ref 39–117)
BUN: 24 mg/dL — ABNORMAL HIGH (ref 6–23)
CO2: 28 mEq/L (ref 19–32)
Calcium: 9.6 mg/dL (ref 8.4–10.5)
Chloride: 104 mEq/L (ref 96–112)
Creatinine, Ser: 0.54 mg/dL (ref 0.40–1.20)
GFR: 90.84 mL/min (ref >60.00)
Glucose, Bld: 105 mg/dL — ABNORMAL HIGH (ref 70–99)
Potassium: 3.9 mEq/L (ref 3.5–5.1)
Sodium: 141 mEq/L (ref 135–145)
Total Bilirubin: 0.4 mg/dL (ref 0.2–1.2)
Total Protein: 6.9 g/dL (ref 6.0–8.3)

## 2021-02-06 LAB — HEMOGLOBIN A1C: Hgb A1c MFr Bld: 5.7 % (ref 4.6–6.5)

## 2021-02-06 LAB — TSH: TSH: 1.58 u[IU]/mL (ref 0.35–4.50)

## 2021-02-06 LAB — VITAMIN D 25 HYDROXY (VIT D DEFICIENCY, FRACTURES): VITD: 113.74 ng/mL (ref 30.00–100.00)

## 2021-02-06 MED ORDER — LEVOTHYROXINE SODIUM 88 MCG PO TABS: 88.0000 ug | ORAL_TABLET | Freq: Every day | ORAL | 3 refills | Status: DC

## 2021-02-06 MED ORDER — LOSARTAN POTASSIUM 100 MG PO TABS: 100.0000 mg | ORAL_TABLET | Freq: Every day | ORAL | 3 refills | Status: DC

## 2021-02-06 MED ORDER — FLUCONAZOLE 150 MG PO TABS: 150.0000 mg | ORAL_TABLET | Freq: Once | ORAL | 0 refills | Status: AC

## 2021-02-06 MED ORDER — FLUTICASONE PROPIONATE 50 MCG/ACT NA SUSP: 2.0000 | Freq: Every day | NASAL | 4 refills | Status: DC

## 2021-02-06 MED ORDER — ZOLPIDEM TARTRATE 5 MG PO TABS: 5.0000 mg | ORAL_TABLET | Freq: Every evening | ORAL | 1 refills | Status: DC | PRN

## 2021-02-06 MED ORDER — METOPROLOL SUCCINATE ER 50 MG PO TB24: 50.0000 mg | ORAL_TABLET | Freq: Every day | ORAL | 3 refills | Status: DC

## 2021-02-06 MED ORDER — ATORVASTATIN CALCIUM 10 MG PO TABS: 10.0000 mg | ORAL_TABLET | ORAL | 3 refills | Status: DC

## 2021-02-06 NOTE — Patient Instructions (Addendum)
Start taking cipro and flagyl  miralax powder  Colace stool softner   Diverticulitis  Diverticulitis is infection or inflammation of small pouches (diverticula) in the colon that form due to a condition called diverticulosis. Diverticula can trap stool (feces) and bacteria, causing infection and inflammation. Diverticulitis may cause severe stomach pain and diarrhea. It may lead to tissue damage in the colon that causes bleeding or blockage. The diverticula may also burst (rupture) and cause infected stool to enter other areas of the abdomen. What are the causes? This condition is caused by stool becoming trapped in the diverticula, which allows bacteria to grow in the diverticula. This leads to inflammation and infection. What increases the risk? You are more likely to develop this condition if you have diverticulosis. The risk increases if you:  Are overweight or obese.  Do not get enough exercise.  Drink alcohol.  Use tobacco products.  Eat a diet that has a lot of red meat such as beef, pork, or lamb.  Eat a diet that does not include enough fiber. High-fiber foods include fruits, vegetables, beans, nuts, and whole grains.  Are over 43 years of age. What are the signs or symptoms? Symptoms of this condition may include:  Pain and tenderness in the abdomen. The pain is normally located on the left side of the abdomen, but it may occur in other areas.  Fever and chills.  Nausea.  Vomiting.  Cramping.  Bloating.  Changes in bowel routines.  Blood in your stool. How is this diagnosed? This condition is diagnosed based on:  Your medical history.  A physical exam.  Tests to make sure there is nothing else causing your condition. These tests may include: ? Blood tests. ? Urine tests. ? CT scan of the abdomen. How is this treated? Most cases of this condition are mild and can be treated at home. Treatment may include:  Taking over-the-counter pain  medicines.  Following a clear liquid diet.  Taking antibiotic medicines by mouth.  Resting. More severe cases may need to be treated at a hospital. Treatment may include:  Not eating or drinking.  Taking prescription pain medicine.  Receiving antibiotic medicines through an IV.  Receiving fluids and nutrition through an IV.  Surgery. When your condition is under control, your health care provider may recommend that you have a colonoscopy. This is an exam to look at the entire large intestine. During the exam, a lubricated, bendable tube is inserted into the anus and then passed into the rectum, colon, and other parts of the large intestine. A colonoscopy can show how severe your diverticula are and whether something else may be causing your symptoms. Follow these instructions at home: Medicines  Take over-the-counter and prescription medicines only as told by your health care provider. These include fiber supplements, probiotics, and stool softeners.  If you were prescribed an antibiotic medicine, take it as told by your health care provider. Do not stop taking the antibiotic even if you start to feel better.  Ask your health care provider if the medicine prescribed to you requires you to avoid driving or using machinery. Eating and drinking  Follow a full liquid diet or another diet as directed by your health care provider.  After your symptoms improve, your health care provider may tell you to change your diet. He or she may recommend that you eat a diet that contains at least 25 grams (25 g) of fiber daily. Fiber makes it easier to pass stool. Healthy sources  of fiber include: ? Berries. One cup contains 4-8 grams of fiber. ? Beans or lentils. One-half cup contains 5-8 grams of fiber. ? Green vegetables. One cup contains 4 grams of fiber.  Avoid eating red meat.   General instructions  Do not use any products that contain nicotine or tobacco, such as cigarettes,  e-cigarettes, and chewing tobacco. If you need help quitting, ask your health care provider.  Exercise for at least 30 minutes, 3 times each week. You should exercise hard enough to raise your heart rate and break a sweat.  Keep all follow-up visits as told by your health care provider. This is important. You may need to have a colonoscopy. Contact a health care provider if:  Your pain does not improve.  Your bowel movements do not return to normal. Get help right away if:  Your pain gets worse.  Your symptoms do not get better with treatment.  Your symptoms suddenly get worse.  You have a fever.  You vomit more than one time.  You have stools that are bloody, black, or tarry. Summary  Diverticulitis is infection or inflammation of small pouches (diverticula) in the colon that form due to a condition called diverticulosis. Diverticula can trap stool (feces) and bacteria, causing infection and inflammation.  You are at higher risk for this condition if you have diverticulosis and you eat a diet that does not include enough fiber.  Most cases of this condition are mild and can be treated at home. More severe cases may need to be treated at a hospital.  When your condition is under control, your health care provider may recommend that you have an exam called a colonoscopy. This exam can show how severe your diverticula are and whether something else may be causing your symptoms.  Keep all follow-up visits as told by your health care provider. This is important. This information is not intended to replace advice given to you by your health care provider. Make sure you discuss any questions you have with your health care provider. Document Revised: 08/23/2019 Document Reviewed: 08/23/2019 Elsevier Patient Education  2021 [employer].

## 2021-02-06 NOTE — Progress Notes (Signed)
Chief Complaint  Patient presents with  . Follow-up    Hypertension, hyperlipidemia, hypothyroidism   6 month f/u  1. Recurrent uti seeing urology Dr. Lorra Hals and keflex 250 mg qd for prevention  2. 11/2020 right gluteus medius and minimus tendon repair Dr. Roland Rack she did PT last week and will do it Q6-8 weeks and f/u 03/05/21  Currently exercise limited 2/2 pain but in PT 3. Chronic insomnia sleeping 5 hours at night on ambien 5 mg qhs has tried melatonin in the past  4. htn uncontrolled on lis 40 mg qd and toprol 50 mg xl took meds today  -will do fasting labs today  5. C/o chest pressure on and off and sob with exertion since 11/2020  -will have pt f/u with cardiology and consider stress test Dr. Saunders Revel  6. LLQ ab pain since 10/2020 saw GI Dr Marius Ditch and c/w diverticulitis she was on cipro/flagy x 4 days but did not complete the dose and gets thrush with Abx pain is daily LLQ and worse with bowel movement though she has been constipated h/o hernia repair and abnormal CT ab/pelvis will repeat this   05/28/18 CT scan  IMPRESSION: 1. Acute Small Bowel Obstruction due to a left lateral flank hernia containing small bowel which appear incarcerated/inflamed. 2. Trace free fluid in the left gutter and pelvis.  No free air. 3. No other acute or inflammatory process. Small left inguinal hernia which no longer contains small bowel. Small fat containing infraumbilical hernia. Extensive diverticulosis of the distal colon.   Electronically Signed   By: Genevie Ann M.D.   On: 05/28/2018 15:22  Review of Systems  Constitutional: Negative for weight loss.  HENT: Negative for hearing loss.   Eyes: Negative for blurred vision.  Respiratory: Negative for shortness of breath.   Cardiovascular: Negative for chest pain.  Gastrointestinal: Positive for abdominal pain and constipation. Negative for nausea and vomiting.  Musculoskeletal: Positive for joint pain.  Skin: Negative for rash.   Psychiatric/Behavioral: The patient has insomnia.    Past Medical History:  Diagnosis Date  . Arthritis    neck, back, left knee;   . Breast cancer ([place]) 2009   right lumpectomy s/p radiation x 1 week 2x per day only   . Chicken pox   . Chronic UTI    established with urology   . Diverticular disease    -osis and -itis   . Esophagitis    egd 05/17/14 see report scanned into chart  . History of kidney stones   . Hormone disorder   . Hyperlipidemia   . Hypertension    controlled well with medication;   . Hypothyroidism   . Osteoporosis   . Personal history of radiation therapy 2009   F/U right breast cancer  . Thyroid disease    hypothyroidism    Past Surgical History:  Procedure Laterality Date  . BONE GRAFT HIP ILIAC CREST     + cage left hip 10/23/17   . BREAST BIOPSY Left 2009   clip,benign  . BREAST BIOPSY Right 2009   +  . BREAST LUMPECTOMY Right 2009   2009 lumpectomy   . CARDIAC CATHETERIZATION     No stents; [facility] doesn't recall facility +34yrs ago  . CATARACT EXTRACTION, BILATERAL Bilateral 10/2019  . COLONOSCOPY WITH PROPOFOL N/A 05/15/2020   Procedure: COLONOSCOPY WITH PROPOFOL;  Surgeon: Lin Landsman, MD;  Location: [facility] ENDOSCOPY;  Service: Gastroenterology;  Laterality: N/A;  . EYE SURGERY     b/l  cataracts sch 10/2019 in [place]  . HERNIA REPAIR    . INSERTION OF MESH N/A 05/29/2018   Procedure: INSERTION OF MESH;  Surgeon: Johnathan Hausen, MD;  Location: [facility] ORS;  Service: General;  Laterality: N/A;  . JOINT REPLACEMENT    . QUADRICEPS TENDON REPAIR Right 12/05/2020   Procedure: OPEN REPAIR OF RIGHT GLUTEUS MINIMUS TENDON;  Surgeon: Corky Mull, MD;  Location: [facility] ORS;  Service: Orthopedics;  Laterality: Right;  . right gluteus medius/minimus repair Dr. Roland Rack 11/2020      Dr. Roland Rack  . SHOULDER SURGERY Left 2008, 2009   x2 rotator cuff surgeries left shoulders  . SPINAL FUSION  07/24/2020  . SPINE SURGERY  10/23/2017   spinal 09/2017  h/o scoliosis [facility] L4-S1 OLIF T10 to ililum fusion  . TONSILLECTOMY     age 27 y.o.   . TOTAL SHOULDER REPLACEMENT Right 2012  . TUBAL LIGATION    . VENTRAL HERNIA REPAIR N/A 05/29/2018   Procedure: LAPAROSCOPIC VENTRAL / [place];  Surgeon: Johnathan Hausen, MD;  Location: [facility] ORS;  Service: General;  Laterality: N/A;   Family History  Problem Relation Age of Onset  . Arthritis Mother   . Heart disease Mother 52  . Hyperlipidemia Mother   . Hypertension Mother   . Coronary artery disease Mother 49  . Arthritis Father   . Diabetes Father   . Cancer Father        colon  . AAA (abdominal aortic aneurysm) Father   . Arthritis Sister   . Hyperlipidemia Sister   . Hypertension Sister   . Cancer Brother        lung, smoker  . Mental retardation Brother   . Drug abuse Daughter        overdose in 2018   . Arthritis Sister   . Hyperlipidemia Sister   . Bladder Cancer Neg Hx   . Kidney cancer Neg Hx   . Breast cancer Neg Hx    Social History   Socioeconomic History  . Marital status: Widowed    Spouse name: Not on file  . Number of children: Not on file  . Years of education: Not on file  . Highest education level: Not on file  Occupational History  . Not on file  Tobacco Use  . Smoking status: Never Smoker  . Smokeless tobacco: Never Used  Vaping Use  . Vaping Use: Never used  Substance and Sexual Activity  . Alcohol use: Not Currently    Alcohol/week: 7.0 standard drinks    Types: 7 Glasses of wine per week  . Drug use: No  . Sexual activity: Yes    Birth control/protection: Post-menopausal  Other Topics Concern  . Not on file  Social History Narrative   College grad   widowed husband died Jul 25, 2019    Wears seatbelt and safe in relationship    3 kids       [place]   Social Determinants of Health   Financial Resource Strain: Low Risk   . Difficulty of Paying Living Expenses: Not hard at all  Food Insecurity: No Food Insecurity  .  Worried About Charity fundraiser in the Last Year: Never true  . Ran Out of Food in the Last Year: Never true  Transportation Needs: No Transportation Needs  . Lack of Transportation (Medical): No  . Lack of Transportation (Non-Medical): No  Physical Activity: Insufficiently Active  . Days of Exercise per Week: 2 days  .  Minutes of Exercise per Session: 60 min  Stress: Not on file  Social Connections: Unknown  . Frequency of Communication with Friends and Family: More than three times a week  . Frequency of Social Gatherings with Friends and Family: Not on file  . Attends Religious Services: Not on file  . Active Member of Clubs or Organizations: Yes  . Attends Archivist Meetings: More than 4 times per year  . Marital Status: Widowed  Intimate Partner Violence: Not on file   Current Meds  Medication Sig  . Calcium Carb-Cholecalciferol ([employer] CALCIUM 600 & VITAMIN D3 PO) Take 1 tablet by mouth daily.  . cephALEXin (KEFLEX) 250 MG capsule Take 1 capsule (250 mg total) by mouth daily.  . Cholecalciferol 125 MCG (5000 UT) capsule Take 5,000 Units by mouth daily.  . fluconazole (DIFLUCAN) 150 MG tablet Take 1 tablet (150 mg total) by mouth once for 1 dose. Repeat 2nd dose if needed in 3 days  . losartan (COZAAR) 100 MG tablet Take 1 tablet (100 mg total) by mouth daily.  . Multiple Vitamin (MULTI-VITAMINS) TABS Take 1 tablet by mouth daily.   . Potassium 99 MG TABS Take 1 tablet by mouth daily.  . [DISCONTINUED] atorvastatin (LIPITOR) 10 MG tablet Take 1 tablet (10 mg total) by mouth every other day. Note 10 mg no need to cut in 1/2 as before you have 20 mg taking 1/2 pill =10 mg  . [DISCONTINUED] fluticasone (FLONASE) 50 MCG/ACT nasal spray Place 2 sprays into both nostrils daily. Max b/l nostrils  . [DISCONTINUED] levothyroxine (SYNTHROID) 88 MCG tablet Take 1 tablet (88 mcg total) by mouth daily before breakfast. 30 minutes  . [DISCONTINUED] lisinopril (ZESTRIL) 40 MG tablet  Take 1 tablet (40 mg total) by mouth daily.  . [DISCONTINUED] metoprolol succinate (TOPROL-XL) 50 MG 24 hr tablet Take 1 tablet (50 mg total) by mouth daily. Take with or immediately following a meal.  . [DISCONTINUED] zolpidem (AMBIEN) 5 MG tablet Take 1 tablet (5 mg total) by mouth at bedtime as needed for sleep.   Allergies  Allergen Reactions  . Sulfa Antibiotics Anaphylaxis, Hives, Itching, Shortness Of Breath and Swelling    Lips & throat swelled  . Nitrofuran Derivatives Swelling    Lip swelling; "eyes get red and puffy"  . Amlodipine Besylate Other (See Comments)    Gas, bloating   Gas, bloating   . Nitrofurantoin Hives and Other (See Comments)   Recent Results (from the past 2160 hour(s))  SARS CORONAVIRUS 2 (TAT 6-24 HRS) Nasopharyngeal Nasopharyngeal Swab     Status: None   Collection Time: 12/01/20  2:42 PM   Specimen: Nasopharyngeal Swab  Result Value Ref Range   SARS Coronavirus 2 NEGATIVE NEGATIVE    Comment: (NOTE) SARS-CoV-2 target nucleic acids are NOT DETECTED.  The SARS-CoV-2 RNA is generally detectable in upper and lower respiratory specimens during the acute phase of infection. Negative results do not preclude SARS-CoV-2 infection, do not rule out co-infections with other pathogens, and should not be used as the sole basis for treatment or other patient management decisions. Negative results must be combined with clinical observations, patient history, and epidemiological information. The expected result is Negative.  Fact Sheet for Patients: SugarRoll.be  Fact Sheet for Healthcare Providers: https://www.woods-mathews.com/  This test is not yet approved or cleared by the [place] FDA and  has been authorized for detection and/or diagnosis of SARS-CoV-2 by FDA under an Emergency Use Authorization (EUA). This EUA will remain  in effect (meaning this test can be used) for the duration of the COVID-19  declaration under Se ction 564(b)(1) of the Act, 21 U.S.C. section 360bbb-3(b)(1), unless the authorization is terminated or revoked sooner.  Performed at [facility], [place] [address] [place], [place] [address]   Urinalysis, Complete     Status: Abnormal   Collection Time: 01/18/21 11:15 AM  Result Value Ref Range   Specific Gravity, UA 1.015 1.005 - 1.030   pH, UA 6.5 5.0 - 7.5   Color, UA Yellow Yellow   Appearance Ur Clear Clear   Leukocytes,UA Negative Negative   Protein,UA Negative Negative/Trace   Glucose, UA Negative Negative   Ketones, UA Negative Negative   RBC, UA Negative Negative   Bilirubin, UA Negative Negative   Urobilinogen, Ur 0.2 0.2 - 1.0 mg/dL   Nitrite, UA Positive (A) Negative   Microscopic Examination See below:   Microscopic Examination     Status: Abnormal   Collection Time: 01/18/21 11:15 AM   Urine  Result Value Ref Range   WBC, UA 6-10 (A) 0 - 5 /hpf   RBC 0-2 0 - 2 /hpf   Epithelial Cells (non renal) 0-10 0 - 10 /hpf   Renal Epithel, UA 0-10 (A) None seen /hpf   Bacteria, UA Few None seen/Few  CULTURE, URINE COMPREHENSIVE     Status: None   Collection Time: 01/18/21  1:26 PM   Specimen: Urine   UR  Result Value Ref Range   Urine Culture, Comprehensive Final report    Organism ID, Bacteria Comment     Comment: Mixed urogenital flora 10,000-25,000 colony forming units per mL   Comprehensive metabolic panel     Status: Abnormal   Collection Time: 02/06/21 10:19 AM  Result Value Ref Range   Sodium 141 135 - 145 mEq/L   Potassium 3.9 3.5 - 5.1 mEq/L   Chloride 104 96 - 112 mEq/L   CO2 28 19 - 32 mEq/L   Glucose, Bld 105 (H) 70 - 99 mg/dL   BUN 24 (H) 6 - 23 mg/dL   Creatinine, Ser 0.54 0.40 - 1.20 mg/dL   Total Bilirubin 0.4 0.2 - 1.2 mg/dL   Alkaline Phosphatase 80 39 - 117 U/L   AST 17 0 - 37 U/L   ALT 12 0 - 35 U/L   Total Protein 6.9 6.0 - 8.3 g/dL   Albumin 4.1 3.5 - 5.2 g/dL   GFR 90.84 >60.00 mL/min    Comment:  Calculated using the CKD-EPI Creatinine Equation (2021)   Calcium 9.6 8.4 - 10.5 mg/dL  Lipid panel     Status: Abnormal   Collection Time: 02/06/21 10:19 AM  Result Value Ref Range   Cholesterol 201 (H) 0 - 200 mg/dL    Comment: ATP III Classification       Desirable:  < 200 mg/dL               Borderline High:  200 - 239 mg/dL          High:  > = 240 mg/dL   Triglycerides 119.0 0.0 - 149.0 mg/dL    Comment: Normal:  <150 mg/dLBorderline High:  150 - 199 mg/dL   HDL 68.30 >39.00 mg/dL   VLDL 23.8 0.0 - 40.0 mg/dL   LDL Cholesterol 109 (H) 0 - 99 mg/dL   Total CHOL/HDL Ratio 3     Comment:  Men          Women1/2 Average Risk     3.4          3.3Average Risk          5.0          4.42X Average Risk          9.6          7.13X Average Risk          15.0          11.0                       NonHDL 132.60     Comment: NOTE:  Non-HDL goal should be 30 mg/dL higher than patient's LDL goal (i.e. LDL goal of < 70 mg/dL, would have non-HDL goal of < 100 mg/dL)  CBC with Differential/Platelet     Status: None   Collection Time: 02/06/21 10:19 AM  Result Value Ref Range   WBC 7.2 4.0 - 10.5 K/uL   RBC 4.59 3.87 - 5.11 Mil/uL   Hemoglobin 12.8 12.0 - 15.0 g/dL   HCT 39.4 36.0 - 46.0 %   MCV 85.9 78.0 - 100.0 fl   MCHC 32.4 30.0 - 36.0 g/dL   RDW 15.1 11.5 - 15.5 %   Platelets 288.0 150.0 - 400.0 K/uL   Neutrophils Relative % 76.3 43.0 - 77.0 %   Lymphocytes Relative 14.1 12.0 - 46.0 %   Monocytes Relative 5.4 3.0 - 12.0 %   Eosinophils Relative 3.3 0.0 - 5.0 %   Basophils Relative 0.9 0.0 - 3.0 %   Neutro Abs 5.5 1.4 - 7.7 K/uL   Lymphs Abs 1.0 0.7 - 4.0 K/uL   Monocytes Absolute 0.4 0.1 - 1.0 K/uL   Eosinophils Absolute 0.2 0.0 - 0.7 K/uL   Basophils Absolute 0.1 0.0 - 0.1 K/uL  Hemoglobin A1c     Status: None   Collection Time: 02/06/21 10:19 AM  Result Value Ref Range   Hgb A1c MFr Bld 5.7 4.6 - 6.5 %    Comment: Glycemic Control Guidelines for People with Diabetes:Non  Diabetic:  <6%Goal of Therapy: <7%Additional Action Suggested:  >8%   TSH     Status: None   Collection Time: 02/06/21 10:19 AM  Result Value Ref Range   TSH 1.58 0.35 - 4.50 uIU/mL  Vitamin D (25 hydroxy)     Status: Abnormal   Collection Time: 02/06/21 10:19 AM  Result Value Ref Range   VITD 113.74 (HH) 30.00 - 100.00 ng/mL   Objective  Body mass index is 26.78 kg/m. Wt Readings from Last 3 Encounters:  02/06/21 132 lb 9.6 oz (60.1 kg)  01/18/21 134 lb (60.8 kg)  12/05/20 129 lb (58.5 kg)   Temp Readings from Last 3 Encounters:  02/06/21 97.8 F (36.6 C) (Oral)  12/06/20 97.6 F (36.4 C) (Temporal)  11/09/20 97.6 F (36.4 C) (Temporal)   BP Readings from Last 3 Encounters:  02/06/21 (!) 140/92  01/18/21 (!) 178/98  12/06/20 122/65   Pulse Readings from Last 3 Encounters:  02/06/21 86  01/18/21 80  12/06/20 86    Physical Exam Vitals and nursing note reviewed.  Constitutional:      Appearance: Normal appearance. She is well-developed, well-groomed and overweight.  HENT:     Head: Normocephalic and atraumatic.  Eyes:     Conjunctiva/sclera: Conjunctivae normal.     Pupils: Pupils are equal, round, and reactive to  light.  Cardiovascular:     Rate and Rhythm: Normal rate and regular rhythm.     Heart sounds: Normal heart sounds. No murmur heard.   Pulmonary:     Effort: Pulmonary effort is normal.     Breath sounds: Normal breath sounds.  Abdominal:     Tenderness: There is abdominal tenderness in the left lower quadrant.    Skin:    General: Skin is warm and dry.  Neurological:     General: No focal deficit present.     Mental Status: She is alert and oriented to person, place, and time. Mental status is at baseline.     Gait: Gait normal.  Psychiatric:        Attention and Perception: Attention and perception normal.        Mood and Affect: Mood and affect normal.        Speech: Speech normal.        Behavior: Behavior normal. Behavior is  cooperative.        Thought Content: Thought content normal.        Cognition and Memory: Cognition and memory normal.        Judgment: Judgment normal.     Assessment  Plan   SOB (shortness of breath) on exertion - Plan: Ambulatory referral to Cardiology Atypical chest pain - Plan: Ambulatory referral to Cardiology -->consider NM stress test with cards better BP control  Hyperlipidemia, unspecified hyperlipidemia type - Plan: Ambulatory referral to Cardiology Hypertension, unspecified type - Plan: losartan (COZAAR) 100 MG tablet D/c lis 40 mg qd  toprol xl 50 mg qd lipitor 10 mg qhs Fasting labs today Comprehensive metabolic panel, Lipid panel, CBC with Differential/Platelet, Ambulatory referral to Cardiology Dr. Saunders Revel  Monitor BP   Insomnia, unspecified type - Plan: zolpidem (AMBIEN) 5 MG tablet  Hypothyroidism, unspecified type - Plan: levothyroxine (SYNTHROID) 88 MCG tablet, TSH  Left lower quadrant abdominal pain r/o diverticulitis vs SBO vs other - Plan: CT Abdomen Pelvis W Contrast rec resume cipro/flagyl hold keflex 250 mg qd for now while doing this 05/28/18 CT scan  IMPRESSION: 1. Acute Small Bowel Obstruction due to a left lateral flank hernia containing small bowel which appear incarcerated/inflamed. 2. Trace free fluid in the left gutter and pelvis.  No free air. 3. No other acute or inflammatory process. Small left inguinal hernia which no longer contains small bowel. Small fat containing infraumbilical hernia. Extensive diverticulosis of the distal colon. est. With [facility]   Thrush - Plan: fluconazole (DIFLUCAN) 150 MG tablet with Abx for abdomen  Vitamin D deficiency - Plan: Vitamin D (25 hydroxy)  Hyperglycemia - Plan: Hemoglobin A1c Prediabetes 5.7  -rec healthy diet and exercise   HM Had flu shotutd prevnar had2/4/19, pna 23 10/10/18 Tdaphad 12/09/11 Declinesshingrix, hep B vaccineprev. zostervax had 2012  covid 19 vx pfizer 3/3 Never  smoker  Consider shingrix vaccine given Rx 07/11/20  s/p right breast lumpectomy 2009 and radiation only  -mammogram7/6/20 negative, 06/05/20 negative  mammo ordered 05/2021   Pap out of age window no h/o abnormal pap  DEXAhad 6/4/14h/o osteopenia, +osteoporosis 12/27/19  06/02/20 T -3.0, -3.2, -3.7 Could not tolerate forteo had reclast influsion 11/23/20 or 11/24/2020 f/u in 1 year 2nd infusion [facility]  Colonoscopy copy obtained reviewed 01/10/09 grade 1 IH and diverticulosis -colonoscopy due at f/uwill hold for now with other multiple appts 05/15/20 tubular adenoma FH father colon cancer dx'ed 80 y.o Q 5 years  rec healthy diet and exercise  Provider: Dr. Olivia Mackie McLean-Scocuzza-Internal Medicine

## 2021-02-06 NOTE — Telephone Encounter (Signed)
lft vm for pt to call ofc to sch CT.Thanks

## 2021-02-06 NOTE — Telephone Encounter (Signed)
CRITICAL VALUE STICKER  CRITICAL VALUE: Vitamin D-113.74  RECEIVER (on-site recipient of call): Jari Favre, CMA/XT  DATE & TIME NOTIFIED: 02/06/21 @3 :14 pm   MESSENGER (representative from lab): Hope from [facility]  MD NOTIFIED: Dr. Gayland Curry  TIME OF NOTIFICATION: 3:16 pm  RESPONSE: see message

## 2021-02-06 NOTE — Telephone Encounter (Signed)
Vitamin D elevated 113.7 normal 30-100   Stop x 1 month and then start taking vitamin D every other day

## 2021-02-07 DIAGNOSIS — M419 Scoliosis, unspecified: Secondary | ICD-10-CM | POA: Diagnosis not present

## 2021-02-07 DIAGNOSIS — M5134 Other intervertebral disc degeneration, thoracic region: Secondary | ICD-10-CM | POA: Diagnosis not present

## 2021-02-07 DIAGNOSIS — E538 Deficiency of other specified B group vitamins: Secondary | ICD-10-CM | POA: Diagnosis not present

## 2021-02-07 DIAGNOSIS — M4327 Fusion of spine, lumbosacral region: Secondary | ICD-10-CM | POA: Diagnosis not present

## 2021-02-07 DIAGNOSIS — E519 Thiamine deficiency, unspecified: Secondary | ICD-10-CM | POA: Diagnosis not present

## 2021-02-07 DIAGNOSIS — E531 Pyridoxine deficiency: Secondary | ICD-10-CM | POA: Diagnosis not present

## 2021-02-08 DIAGNOSIS — M25551 Pain in right hip: Secondary | ICD-10-CM | POA: Diagnosis not present

## 2021-02-08 NOTE — Telephone Encounter (Signed)
Patient informed and verbalized understanding.  Will Hold 50,000 vitamin d and will take the 5,000 every other day.  See result note.

## 2021-02-09 ENCOUNTER — Ambulatory Visit
Admission: RE | Admit: 2021-02-09 | Discharge: 2021-02-09 | Disposition: A | Discharge status: Home / Self Care | Payer: Medicare Other | Source: Ambulatory Visit | Attending: Internal Medicine | Admitting: Internal Medicine

## 2021-02-09 ENCOUNTER — Other Ambulatory Visit: Payer: Self-pay

## 2021-02-09 ENCOUNTER — Ambulatory Visit: Payer: Medicare Other | Admitting: Family

## 2021-02-09 DIAGNOSIS — R1032 Left lower quadrant pain: Secondary | ICD-10-CM | POA: Diagnosis not present

## 2021-02-09 DIAGNOSIS — K5792 Diverticulitis of intestine, part unspecified, without perforation or abscess without bleeding: Secondary | ICD-10-CM

## 2021-02-09 DIAGNOSIS — R109 Unspecified abdominal pain: Secondary | ICD-10-CM | POA: Diagnosis not present

## 2021-02-09 IMAGING — CT CT ABD-PELV W/ CM
2 of 5 series · 15 of 46 positions shown, 17 images · IV contrast (omnipaque)
Comparison: [DATE]

CLINICAL DATA: Acute abdominal pain primarily in the left lower
quadrant.

EXAM:
CT ABDOMEN AND PELVIS WITH CONTRAST
TECHNIQUE: Multidetector CT imaging of the abdomen and pelvis was performed
using the standard protocol following bolus administration of
intravenous contrast.
CONTRAST:  100mL OMNIPAQUE IOHEXOL 300 MG/ML  SOLN

[Series 2: abd pelvis (person_name) 5.00 · axial · 0.65mm/px · z∈[-1472,-1087]mm · 12 of 87 slices shown, 14 images]
[im 5/87  soft-tissue]
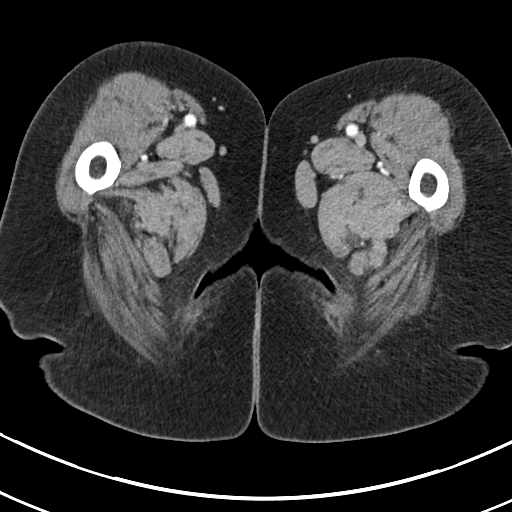
[im 5/87  bone]
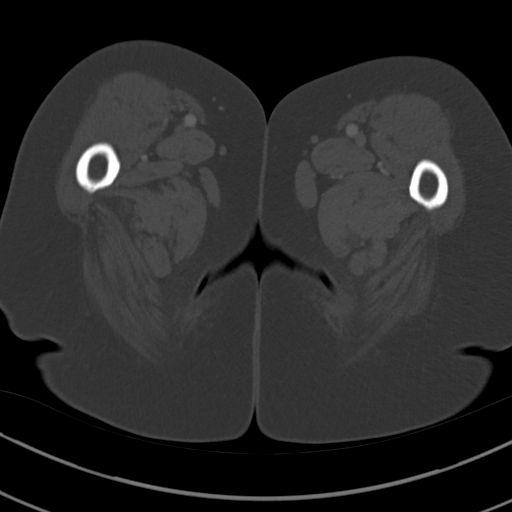
[im 15/87  soft-tissue]
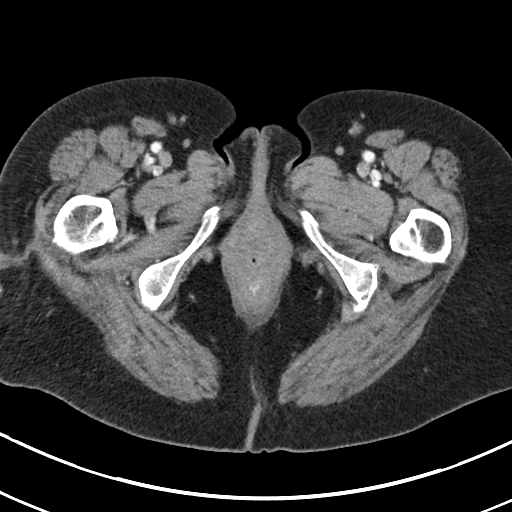
[im 20/87  soft-tissue]
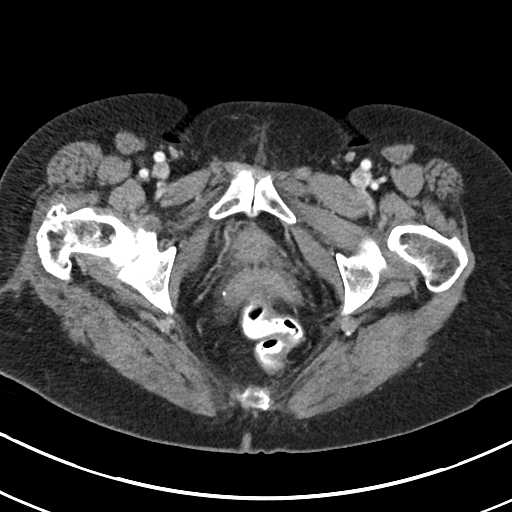
[im 24/87  soft-tissue]
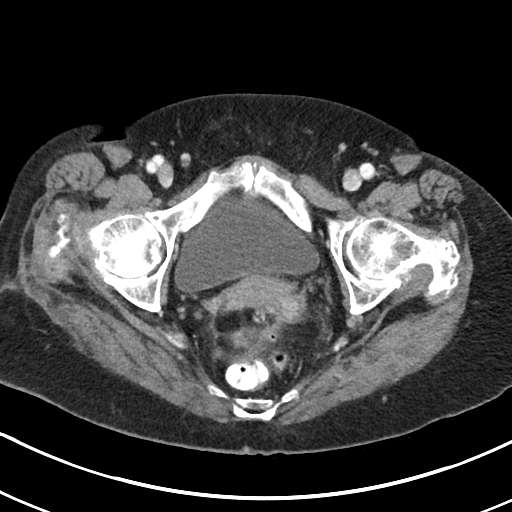
[im 34/87  soft-tissue]
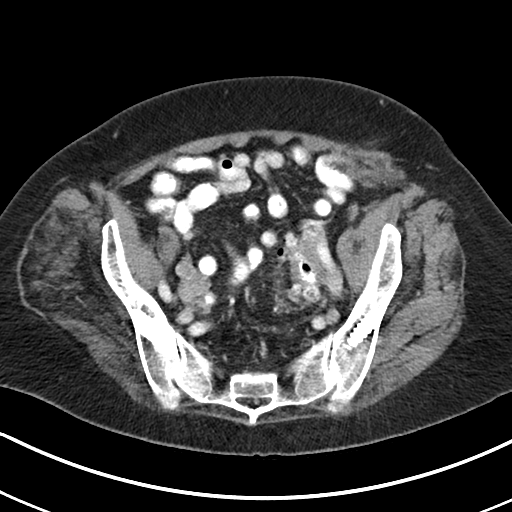
[im 39/87  soft-tissue]
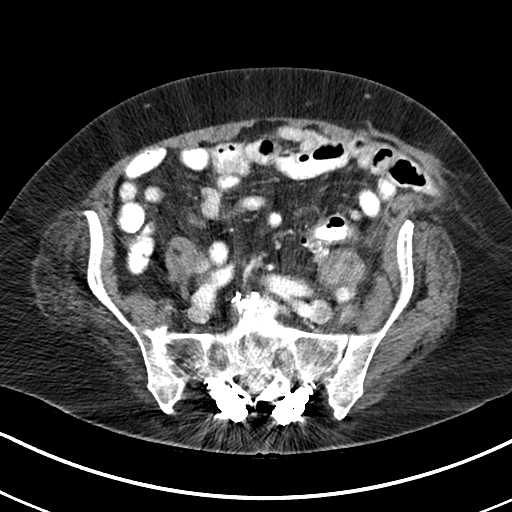
[im 48/87  soft-tissue]
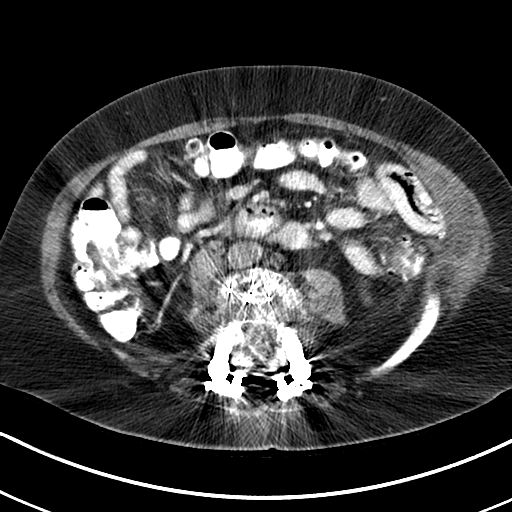
[im 53/87  soft-tissue]
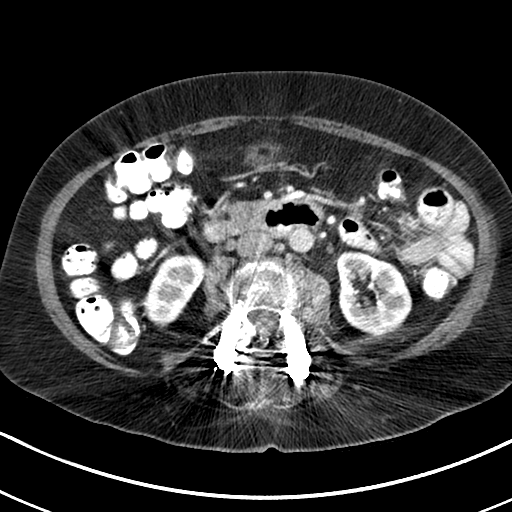
[im 63/87  soft-tissue]
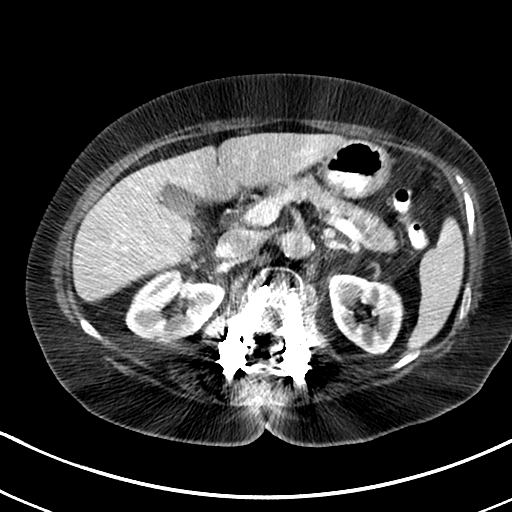
[im 63/87  bone]
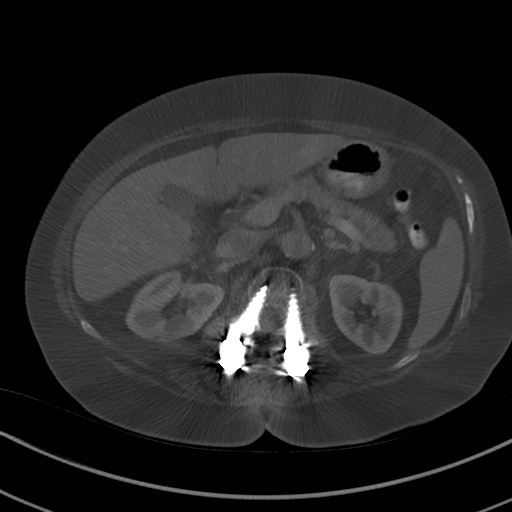
[im 67/87  soft-tissue]
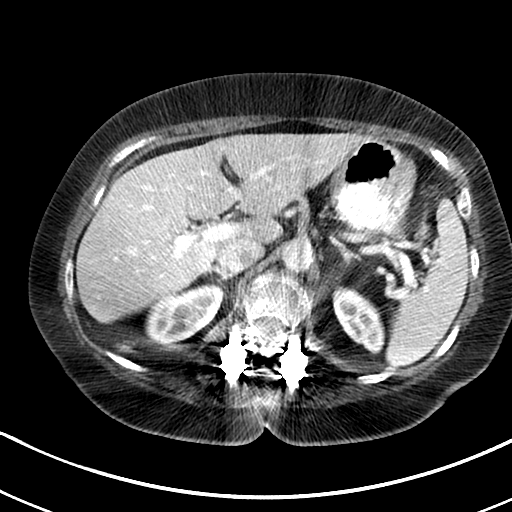
[im 72/87  soft-tissue]
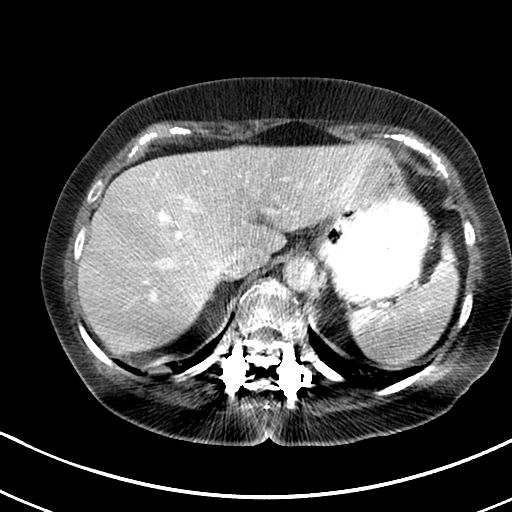
[im 82/87  soft-tissue]
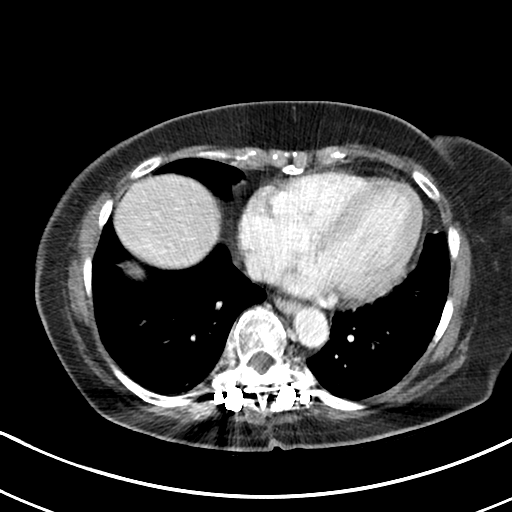

[Series 4: coronals abd pelvis (person_name) 2.00 cor · coronal · 0.65mm/px · 3 of 128 slices shown]
[im 43/128  soft-tissue]
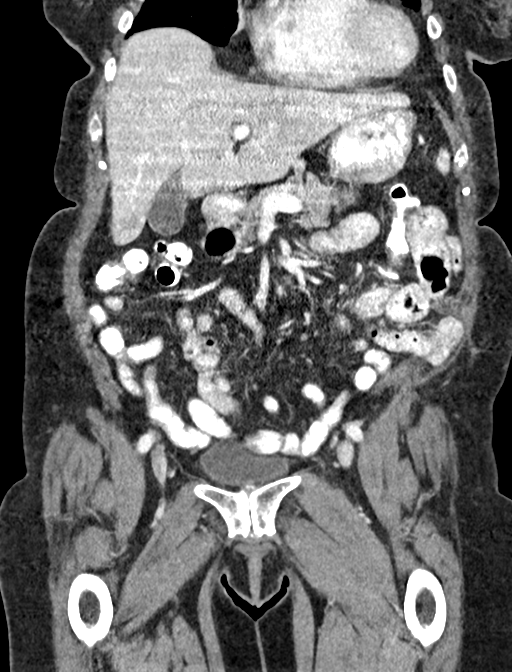
[im 57/128  soft-tissue]
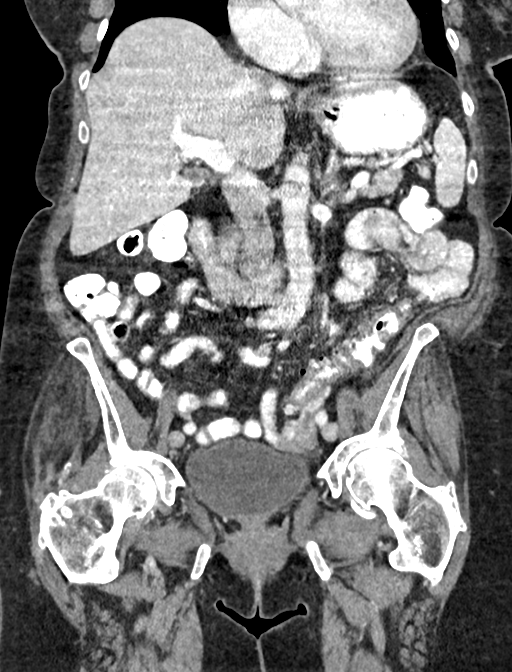
[im 71/128  soft-tissue]
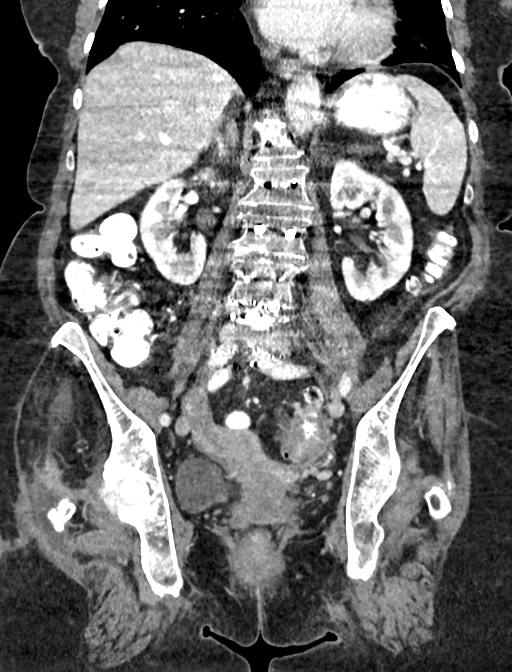

[15 of 46 positions shown; findings below may reference images not displayed]

FINDINGS: Lower chest: The lung bases are clear. The heart is enlarged.

Hepatobiliary: The liver is normal. Normal gallbladder.There is no
biliary ductal dilation.

Pancreas: Normal contours without ductal dilatation. No
peripancreatic fluid collection.

Spleen: Unremarkable.

Adrenals/Urinary Tract:

--Adrenal glands: Unremarkable.

--Right kidney/ureter: No hydronephrosis or radiopaque kidney
stones.

--Left kidney/ureter: No hydronephrosis or radiopaque kidney stones.

--Urinary bladder: Unremarkable.

Stomach/Bowel:

--Stomach/Duodenum: No hiatal hernia or other gastric abnormality.
Normal duodenal course and caliber.

--Small bowel: Unremarkable.

--Colon: There is severe sigmoid diverticulosis. There is extensive
wall thickening of the sigmoid colon with mild adjacent fat
stranding. There is no extraluminal air. No abscess formation.

--Appendix: Normal.

Vascular/Lymphatic: Atherosclerotic calcification is present within
the non-aneurysmal abdominal aorta, without hemodynamically
significant stenosis.

--No retroperitoneal lymphadenopathy.

--No mesenteric lymphadenopathy.

--No pelvic or inguinal lymphadenopathy.

Reproductive: Unremarkable

Other: No ascites or free air. The abdominal wall is normal.

Musculoskeletal. No acute displaced fractures. Extensive
thoracolumbar fusion hardware is noted and is affectively stable.
IMPRESSION: 1. Examination is limited by extensive streak artifact from the
patient's thoracolumbar fusion hardware.
2. Severe sigmoid diverticulosis with extensive wall thickening of
the sigmoid colon with mild adjacent fat stranding, consistent with
acute diverticulitis. No extraluminal air or abscess formation. As
an underlying mass cannot be excluded, follow-up with colonoscopy is
recommended following treatment of the patient's acute symptoms.
3. Cardiomegaly.

Aortic Atherosclerosis ([FB]-[FB]).

## 2021-02-09 MED ORDER — IOHEXOL 300 MG/ML  SOLN
100.0000 mL | Freq: Once | INTRAMUSCULAR | Status: AC | PRN
  Administered 2021-02-09: 100 mL via INTRAVENOUS

## 2021-02-12 ENCOUNTER — Other Ambulatory Visit: Payer: Self-pay | Admitting: Internal Medicine

## 2021-02-12 DIAGNOSIS — M25551 Pain in right hip: Secondary | ICD-10-CM | POA: Diagnosis not present

## 2021-02-12 DIAGNOSIS — K5792 Diverticulitis of intestine, part unspecified, without perforation or abscess without bleeding: Secondary | ICD-10-CM

## 2021-02-12 DIAGNOSIS — I10 Essential (primary) hypertension: Secondary | ICD-10-CM

## 2021-02-12 MED ORDER — METRONIDAZOLE 500 MG PO TABS: 500.0000 mg | ORAL_TABLET | Freq: Two times a day (BID) | ORAL | 0 refills | Status: DC

## 2021-02-12 MED ORDER — TELMISARTAN 80 MG PO TABS: 80.0000 mg | ORAL_TABLET | Freq: Every day | ORAL | 3 refills | Status: DC

## 2021-02-12 MED ORDER — CIPROFLOXACIN HCL 500 MG PO TABS: 500.0000 mg | ORAL_TABLET | Freq: Two times a day (BID) | ORAL | 0 refills | Status: DC

## 2021-02-12 MED ORDER — ATORVASTATIN CALCIUM 20 MG PO TABS: 20.0000 mg | ORAL_TABLET | ORAL | 3 refills | Status: DC

## 2021-02-12 NOTE — Progress Notes (Signed)
I would be happy to see her , has she got some antibiotics for the acute diverticulitis on the scan?

## 2021-02-12 NOTE — Progress Notes (Signed)
Sounds good , I will have [place] call her and have her see me in the office within the next 5-10 days , if you can ensure in the meanwhile she is doing ok with the antibiotics, if not improving would need IV antibiotics in the hospital.   Bailey Mech

## 2021-02-14 ENCOUNTER — Encounter: Payer: Self-pay | Admitting: Family

## 2021-02-14 ENCOUNTER — Telehealth: Payer: Self-pay | Admitting: Internal Medicine

## 2021-02-14 ENCOUNTER — Ambulatory Visit (INDEPENDENT_AMBULATORY_CARE_PROVIDER_SITE_OTHER): Payer: Medicare Other | Admitting: Family

## 2021-02-14 ENCOUNTER — Other Ambulatory Visit: Payer: Self-pay

## 2021-02-14 ENCOUNTER — Other Ambulatory Visit: Payer: Self-pay | Admitting: Internal Medicine

## 2021-02-14 VITALS — BP 156/86 | HR 80 | Ht 59.0 in | Wt 133.0 lb

## 2021-02-14 DIAGNOSIS — I517 Cardiomegaly: Secondary | ICD-10-CM | POA: Diagnosis not present

## 2021-02-14 DIAGNOSIS — K5792 Diverticulitis of intestine, part unspecified, without perforation or abscess without bleeding: Secondary | ICD-10-CM

## 2021-02-14 DIAGNOSIS — R0600 Dyspnea, unspecified: Secondary | ICD-10-CM

## 2021-02-14 DIAGNOSIS — R0609 Other forms of dyspnea: Secondary | ICD-10-CM

## 2021-02-14 DIAGNOSIS — I10 Essential (primary) hypertension: Secondary | ICD-10-CM | POA: Diagnosis not present

## 2021-02-14 DIAGNOSIS — R0789 Other chest pain: Secondary | ICD-10-CM | POA: Diagnosis not present

## 2021-02-14 DIAGNOSIS — M25551 Pain in right hip: Secondary | ICD-10-CM | POA: Diagnosis not present

## 2021-02-14 MED ORDER — AMOXICILLIN-POT CLAVULANATE 875-125 MG PO TABS: 1.0000 | ORAL_TABLET | Freq: Two times a day (BID) | ORAL | 0 refills | Status: DC

## 2021-02-14 NOTE — Telephone Encounter (Signed)
Patient states that the Cipro is causing her upset stomach and decreased appetite. Patient states she also looked this up and Cipro can cause tendon tearing. States she has torn tendons in both hips already and was worried about this.   Patient is okay to switch to Augmentin but wants to know if she will still have to take 2 antibiotics? She is currently taking the Flagyl and Cipro. Patient states this may have something to do with her not feeling well too.   Please advise    Cc'd Fransisco Beau as your response may come after 4:00 pm.

## 2021-02-14 NOTE — Progress Notes (Signed)
Office Visit    Patient Name: Maria Spencer Date of Encounter: 02/14/2021  PCP:  McLean-Scocuzza, Nino Glow, MD   [place]  Cardiologist:  [name], MD  Advanced Practice Provider:  No care team member to display Electrophysiologist:  None    Chief Complaint    Maria Spencer is a 75 y.o. female with a hx of hypertension, hyperlipidemia, right breast cancer s/p lumpectomy and radiation, hypothyroidism, esophagitis presents today for shortness of breath  Past Medical History    Past Medical History:  Diagnosis Date  . Arthritis    neck, back, left knee;   . Breast cancer ([place]) 2009   right lumpectomy s/p radiation x 1 week 2x per day only   . Chicken pox   . Chronic UTI    established with urology   . Diverticular disease    -osis and -itis   . Esophagitis    egd 05/17/14 see report scanned into chart  . History of kidney stones   . Hormone disorder   . Hyperlipidemia   . Hypertension    controlled well with medication;   . Hypothyroidism   . Osteoporosis   . Personal history of radiation therapy 2009   F/U right breast cancer  . Thyroid disease    hypothyroidism    Past Surgical History:  Procedure Laterality Date  . BONE GRAFT HIP ILIAC CREST     + cage left hip 10/23/17   . BREAST BIOPSY Left 2009   clip,benign  . BREAST BIOPSY Right 2009   +  . BREAST LUMPECTOMY Right 2009   2009 lumpectomy   . CARDIAC CATHETERIZATION     No stents; [facility] doesn't recall facility +23yrs ago  . CATARACT EXTRACTION, BILATERAL Bilateral 10/2019  . COLONOSCOPY WITH PROPOFOL N/A 05/15/2020   Procedure: COLONOSCOPY WITH PROPOFOL;  Surgeon: Lin Landsman, MD;  Location: [facility] ENDOSCOPY;  Service: Gastroenterology;  Laterality: N/A;  . EYE SURGERY     b/l cataracts sch 10/2019 in [place]  . HERNIA REPAIR    . INSERTION OF MESH N/A 05/29/2018   Procedure: INSERTION OF MESH;  Surgeon: Johnathan Hausen, MD;  Location: [facility] ORS;  Service:  General;  Laterality: N/A;  . JOINT REPLACEMENT    . QUADRICEPS TENDON REPAIR Right 12/05/2020   Procedure: OPEN REPAIR OF RIGHT GLUTEUS MINIMUS TENDON;  Surgeon: Corky Mull, MD;  Location: [facility] ORS;  Service: Orthopedics;  Laterality: Right;  . right gluteus medius/minimus repair Dr. Roland Rack 11/2020      Dr. Roland Rack  . SHOULDER SURGERY Left 2008, 2009   x2 rotator cuff surgeries left shoulders  . SPINAL FUSION  07/24/2020  . SPINE SURGERY  10/23/2017   spinal 09/2017 h/o scoliosis [facility] L4-S1 OLIF T10 to ililum fusion  . TONSILLECTOMY     age 30 y.o.   . TOTAL SHOULDER REPLACEMENT Right 2012  . TUBAL LIGATION    . VENTRAL HERNIA REPAIR N/A 05/29/2018   Procedure: LAPAROSCOPIC VENTRAL / LATERAL HERNIA REPAIR;  Surgeon: Johnathan Hausen, MD;  Location: [facility] ORS;  Service: General;  Laterality: N/A;    Allergies  Allergies  Allergen Reactions  . Sulfa Antibiotics Anaphylaxis, Hives, Itching, Shortness Of Breath and Swelling    Lips & throat swelled  . Nitrofuran Derivatives Swelling    Lip swelling; "eyes get red and puffy"  . Amlodipine Besylate Other (See Comments)    Gas, bloating   Gas, bloating   . Nitrofurantoin Hives and Other (See  Comments)    History of Present Illness    Maria Spencer is a 75 y.o. female with a hx of hypertension, hyperlipidemia, right breast cancer specifically lumpectomy and radiation, hypothyroidism, esophagitis last seen 11/29/2020 by Dr. Saunders Revel.  She lost her spouse August 2020 to cancer.  She has had multiple health ailments since that time including spinal fusion.  Most recently she was seen by Dr. Saunders Revel 11/29/2020 for preoperative clearance for tendon repair.  At that visit she reported no chest pain, shortness of breath, lightheadedness, edema.  Due to elevated blood pressure Toprol was increased to 50 mg daily and lisinopril maintained at 40 mg daily.  Presents today for follow-up of the direction of her primary care provider.  Seen by their office 02/13/2020  noting chest pain, shortness of breath as well as elevated blood pressure.  She was initially transition from lisinopril to losartan but due to medication shortage is was recommended to start telmisartan 40 mg daily and increase to 80 mg if her blood pressure was not at goal.  Tells me she will have sharp pains under her left armpit. Happen when she is sitting still. Tells me it has been ongoing for a couple of months and has been the same. Improves with standing and taking a deep breath.  We discussed that this is atypical for angina.  She is presently on antibiotics for diverticulitis and had abdominal CT scan which inadvertently had a finding cardiomegaly.  She endorses shortness of breath when she is talking long periods of time on the phone and also with exertion.  Does note that she has not been as active as usual with her recent tendon surgery and is working with physical therapy to increase her activity.  EKGs/Labs/Other Studies Reviewed:   The following studies were reviewed today:  EKG:  EKG is  ordered today.  The ekg ordered today demonstrates NSR 80 bpm with no acute ST/T wave changes.   Recent Labs: 05/08/2020: Magnesium 2.0 02/06/2021: ALT 12; BUN 24; Creatinine, Ser 0.54; Hemoglobin 12.8; Platelets 288.0; Potassium 3.9; Sodium 141; TSH 1.58  Recent Lipid Panel    Component Value Date/Time   CHOL 201 (H) 02/06/2021 1019   CHOL 208 (H) 07/12/2020 0917   TRIG 119.0 02/06/2021 1019   HDL 68.30 02/06/2021 1019   HDL 74 07/12/2020 0917   CHOLHDL 3 02/06/2021 1019   VLDL 23.8 02/06/2021 1019   LDLCALC 109 (H) 02/06/2021 1019   LDLCALC 112 (H) 07/12/2020 0917   LDLCALC 138 (H) 09/10/2019 1456   Home Medications   Current Meds  Medication Sig  . atorvastatin (LIPITOR) 20 MG tablet Take 1 tablet (20 mg total) by mouth every other day. D/c 10 mg pill increased dose  . Calcium Carb-Cholecalciferol ([facility] CALCIUM 600 & VITAMIN D3 PO) Take 1 tablet by mouth daily.  . cephALEXin  (KEFLEX) 250 MG capsule Take 1 capsule (250 mg total) by mouth daily.  . fluticasone (FLONASE) 50 MCG/ACT nasal spray Place 2 sprays into both nostrils daily. Max b/l nostrils  . levothyroxine (SYNTHROID) 88 MCG tablet Take 1 tablet (88 mcg total) by mouth daily before breakfast. 30 minutes  . metoprolol succinate (TOPROL-XL) 50 MG 24 hr tablet Take 1 tablet (50 mg total) by mouth daily. Take with or immediately following a meal.  . Multiple Vitamin (MULTI-VITAMINS) TABS Take 1 tablet by mouth daily.   . Potassium 99 MG TABS Take 1 tablet by mouth daily.  Marland Kitchen telmisartan (MICARDIS) 80 MG tablet Take 1 tablet (  80 mg total) by mouth daily. In place of losartan 100. Take 1/2 pill day 3 days and if BP >130/>80 take 1 pill day 4 and beyond daily  . zolpidem (AMBIEN) 5 MG tablet Take 1 tablet (5 mg total) by mouth at bedtime as needed for sleep.  . [DISCONTINUED] ciprofloxacin (CIPRO) 500 MG tablet Take 1 tablet (500 mg total) by mouth 2 (two) times daily.  . [DISCONTINUED] metroNIDAZOLE (FLAGYL) 500 MG tablet Take 1 tablet (500 mg total) by mouth 2 (two) times daily. With food    Review of Systems  All other systems reviewed and are otherwise negative except as noted above.  Physical Exam    VS:  BP (!) 156/86 (BP Location: Left Arm, Patient Position: Sitting, Cuff Size: Normal)   Pulse 80   Ht 4\' 11"  (1.499 m)   Wt 133 lb (60.3 kg)   SpO2 97%   BMI 26.86 kg/m  , BMI Body mass index is 26.86 kg/m.  Wt Readings from Last 3 Encounters:  02/14/21 133 lb (60.3 kg)  02/06/21 132 lb 9.6 oz (60.1 kg)  01/18/21 134 lb (60.8 kg)    GEN: Well nourished, well developed, in no acute distress. HEENT: normal. Neck: Supple, no JVD, carotid bruits, or masses. Cardiac: RRR, no murmurs, rubs, or gallops. No clubbing, cyanosis, edema.  Radials/DP/PT 2+ and equal bilaterally.  Respiratory:  Respirations regular and unlabored, clear to auscultation bilaterally. GI: Soft, nontender, nondistended. MS: No  deformity or atrophy. Skin: Warm and dry, no rash. Neuro:  Strength and sensation are intact. Psych: Normal affect.  Assessment & Plan    1. Chest pain in adult-reports sharp chest pain under her left armpit that occurs at rest and resolves with deep breath and stretching.  Atypical for angina and suspect musculoskeletal etiology.  EKG today with no acute ST/T wave changes.  Cardiac CTA 2020 with calcium score of 0.  No indication for ischemic evaluation at this time though if echocardiogram unrevealing could consider lexiscan.   2. DOE / Cardiomegaly-reports increasing DOE.  Lung sounds clear exam.  Noted cardiomegaly indirect finding on recent CT of the abdomen.  Plan for echocardiogram.    3. HTN -encouraged home monitoring.  BP elevated today.  Started on tomograms by primary care provider 2 days ago and she will continue to follow with their office.  Plan is to increase to 80 mg daily if blood pressure is not well controlled on 40 mg.   Disposition: Follow up After echocardiogram with Dr. Saunders Revel or APP.   Signed, Loel Dubonnet, NP 02/14/2021, 8:47 PM [place]

## 2021-02-14 NOTE — Telephone Encounter (Signed)
Why cant she take cipro?  If its nausea we can give medication for this   Only other alternative is augmentin does she want to try this   She needs to f/u with GI and take antibiotics until then this is urgent

## 2021-02-14 NOTE — Telephone Encounter (Signed)
Note from CT Abdomen:   Nino Glow McLean-Scocuzza, MD  02/14/2021  1:01 PM EDT      Per GI Sounds good , I will have Jadijah call her and have her see me in the office within the next 5-10 days , if you can ensure in the meanwhile she is doing ok with the antibiotics, if not improving would need IV antibiotics in the hospital.    Dr. [name]    Thressa Sheller, [place]  02/12/2021  1:54 PM EDT      Patient informed and verbalized understanding.    [facility] cardiology Wednesday    [name], MD  02/12/2021 12:55 PM EDT      Sounds good , I will have [place] call her and have her see me in the office within the next 5-10 days , if you can ensure in the meanwhile she is doing ok with the antibiotics, if not improving would need IV antibiotics in the hospital.    Drusilla Kanner, MD  02/12/2021 12:49 PM EDT      I would be happy to see her , has she got some antibiotics for the acute diverticulitis on the scan?   Nino Glow McLean-Scocuzza, MD  02/12/2021 12:33 PM EDT      Severe diverticulosis with thickening of colon end portion and fat stranding  +acute diverticulitis  +did she finish cipro and flagyl? I will send 5-7 more days to her pharmacy and she needs to call GI for follow up  -Dr. Vicente Males will you care for pt she does not wish to see Dr. Marius Ditch? 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

## 2021-02-14 NOTE — Telephone Encounter (Signed)
Left detailed message for patient any questions she is to return call back.

## 2021-02-14 NOTE — Telephone Encounter (Signed)
Sent augmentin stop flagyl and cipro she needs to take some Abx now before she ends up with complications from diverticulitis and f/u with GI asap

## 2021-02-14 NOTE — Telephone Encounter (Signed)
Pt called she is unable to take the ciprofloxacin (CIPRO) 500 MG tablet. She wanted to know if something else could be called in

## 2021-02-14 NOTE — Patient Instructions (Addendum)
Medication Instructions:  Continue your current medications.   *If you need a refill on your cardiac medications before your next appointment, please call your pharmacy*  Lab Work: None ordered today.   Testing/Procedures: Your physician has requested that you have an echocardiogram. Echocardiography is a painless test that uses sound waves to create images of your heart. It provides your doctor with information about the size and shape of your heart and how well your heart's chambers and valves are working. This procedure takes approximately one hour. There are no restrictions for this procedure.  Follow-Up: At [facility], you and your health needs are our priority.  As part of our continuing mission to provide you with exceptional heart care, we have created designated Provider Care Teams.  These Care Teams include your primary Cardiologist (physician) and Advanced Practice Providers (APPs -  Physician Assistants and Nurse Practitioners) who all work together to provide you with the care you need, when you need it.  We recommend signing up for the patient portal called "MyChart".  Sign up information is provided on this After Visit Summary.  MyChart is used to connect with patients for Virtual Visits (Telemedicine).  Patients are able to view lab/test results, encounter notes, upcoming appointments, etc.  Non-urgent messages can be sent to your provider as well.   To learn more about what you can do with MyChart, go to [email or web address].    Your next appointment:   After the echocardigram  The format for your next appointment:   In Person  Provider:   You may see [name], MD or one of the following Advanced Practice Providers on your designated Care Team:    Murray Hodgkins, NP  Christell Faith, PA-C  Marrianne Mood, PA-C  Cadence Kathlen Mody, [place]  Laurann Montana, NP  Other Instructions  Heart Healthy Diet Recommendations: A low-salt diet is recommended. Meats  should be grilled, baked, or boiled. Avoid fried foods. Focus on lean protein sources like fish or chicken with vegetables and fruits. The American Heart Association is a Microbiologist!  American Heart Association Diet and Lifeystyle Recommendations   Exercise recommendations: The American Heart Association recommends 150 minutes of moderate intensity exercise weekly. Try 30 minutes of moderate intensity exercise 4-5 times per week. This could include walking, jogging, or swimming.

## 2021-02-15 NOTE — Telephone Encounter (Signed)
Maria Hess do I have any appointments to see her next week ? Any procedure cancellations ? If yes can add end of the morning , I can come and see her after the procedures before lunch

## 2021-02-15 NOTE — Telephone Encounter (Signed)
FYI Dr McLean-Scocuzza, Nino Glow, MD

## 2021-02-15 NOTE — Telephone Encounter (Signed)
Faxed D/C of Cipro back to [facility] at [phone] on 02/15/21

## 2021-02-20 ENCOUNTER — Ambulatory Visit (INDEPENDENT_AMBULATORY_CARE_PROVIDER_SITE_OTHER): Payer: Medicare Other | Admitting: Gastroenterology

## 2021-02-20 ENCOUNTER — Ambulatory Visit (INDEPENDENT_AMBULATORY_CARE_PROVIDER_SITE_OTHER): Payer: Medicare Other | Admitting: Internal Medicine

## 2021-02-20 ENCOUNTER — Encounter: Payer: Self-pay | Admitting: Internal Medicine

## 2021-02-20 ENCOUNTER — Other Ambulatory Visit: Payer: Self-pay

## 2021-02-20 ENCOUNTER — Encounter: Payer: Self-pay | Admitting: Gastroenterology

## 2021-02-20 VITALS — BP 164/90 | HR 111 | Ht 59.0 in | Wt 132.4 lb

## 2021-02-20 VITALS — BP 152/90 | HR 89 | Temp 97.5°F | Ht 59.0 in | Wt 132.0 lb

## 2021-02-20 DIAGNOSIS — I10 Essential (primary) hypertension: Secondary | ICD-10-CM | POA: Diagnosis not present

## 2021-02-20 DIAGNOSIS — K5733 Diverticulitis of large intestine without perforation or abscess with bleeding: Secondary | ICD-10-CM

## 2021-02-20 DIAGNOSIS — K5792 Diverticulitis of intestine, part unspecified, without perforation or abscess without bleeding: Secondary | ICD-10-CM | POA: Diagnosis not present

## 2021-02-20 DIAGNOSIS — E538 Deficiency of other specified B group vitamins: Secondary | ICD-10-CM

## 2021-02-20 MED ORDER — AMLODIPINE BESYLATE 2.5 MG PO TABS: 2.5000 mg | ORAL_TABLET | Freq: Every day | ORAL | 0 refills | Status: DC

## 2021-02-20 NOTE — Progress Notes (Signed)
Maria Bellows MD, MRCP(U.K) [address]  [place]  [place], [place] [address]  Main: [phone]  Fax: [phone]   Gastroenterology Consultation  Referring Provider:     McLean-Scocuzza, Maria Mackie * Primary Care Physician:  Spencer, Maria Glow, MD Primary Gastroenterologist:  Dr. Jonathon Spencer  Reason for Consultation:    Transfer of care from Maria Spencer for diverticulitis        HPI:   Maria Spencer is a 75 y.o. y/o female referred for consultation & management  by Dr. Terese Spencer, Maria Glow, MD.     She is a patient who has seen Maria Spencer last about a year back for chronic left lower quadrant pain.  Has a diagnosis of irritable bowel syndrome.  Prior history of diverticulitis in March 2021, esophagitis in 2015 at that time she was treated with ciprofloxacin and Flagyl for 2 weeks to treat the diverticulitis.  And she underwent a colonoscopy in June 2021 4 mm polyp was resected in the transverse colon.  Hemorrhoids were noted and sigmoid diverticulosis was noted.  The polyp was a tubular adenoma.  She was seen again in December 2021 developed some constipation and was commenced on MiraLAX  02/06/2021 hemoglobin 12.8 g normal TSH.  B12 was 260 and folate was normal..  She underwent a CT scan of the abdomen on 02/11/2021 for acute abdominal pain and severe sigmoid diverticulosis was extensive wall thickening of the sigmoid colon and mild adjacent fat stranding noted consistent with acute diverticulitis.  Dr. Terese Spencer, Maria Glow, MD contacted me and patient was started on antibiotics ciprofloxacin and Flagyl.  She just finished the course of her antibiotics and states that her symptoms have significantly improved and has minimal pain in the left lower quadrant.  Past Medical History:  Diagnosis Date  . Arthritis    neck, back, left knee;   . Breast cancer ([place]) 2009   right lumpectomy s/p radiation x 1 week 2x per day only   . Chicken pox   . Chronic UTI    established  with urology   . COVID-19    ? 12/2020 sob   . Diverticular disease    -osis and -itis   . Esophagitis    egd 05/17/14 see report scanned into chart  . History of kidney stones   . Hormone disorder   . Hyperlipidemia   . Hypertension    controlled well with medication;   . Hypothyroidism   . Osteoporosis   . Personal history of radiation therapy 2009   F/U right breast cancer  . Thyroid disease    hypothyroidism     Past Surgical History:  Procedure Laterality Date  . BONE GRAFT HIP ILIAC CREST     + cage left hip 10/23/17   . BREAST BIOPSY Left 2009   clip,benign  . BREAST BIOPSY Right 2009   +  . BREAST LUMPECTOMY Right 2009   2009 lumpectomy   . CARDIAC CATHETERIZATION     No stents; [facility] doesn't recall facility +60yrs ago  . CATARACT EXTRACTION, BILATERAL Bilateral 10/2019  . COLONOSCOPY WITH PROPOFOL N/A 05/15/2020   Procedure: COLONOSCOPY WITH PROPOFOL;  Surgeon: Lin Landsman, MD;  Location: [facility] ENDOSCOPY;  Service: Gastroenterology;  Laterality: N/A;  . EYE SURGERY     b/l cataracts sch 10/2019 in [place]  . HERNIA REPAIR    . INSERTION OF MESH N/A 05/29/2018   Procedure: INSERTION OF MESH;  Surgeon: Johnathan Hausen, MD;  Location: [facility] ORS;  Service:  General;  Laterality: N/A;  . JOINT REPLACEMENT    . QUADRICEPS TENDON REPAIR Right 12/05/2020   Procedure: OPEN REPAIR OF RIGHT GLUTEUS MINIMUS TENDON;  Surgeon: Corky Mull, MD;  Location: [facility] ORS;  Service: Orthopedics;  Laterality: Right;  . right gluteus medius/minimus repair Dr. Roland Rack 11/2020      Dr. Roland Rack  . SHOULDER SURGERY Left 2008, 2009   x2 rotator cuff surgeries left shoulders  . SPINAL FUSION  07/24/2020  . SPINE SURGERY  10/23/2017   spinal 09/2017 h/o scoliosis [facility] L4-S1 OLIF T10 to ililum fusion  . TONSILLECTOMY     age 5 y.o.   . TOTAL SHOULDER REPLACEMENT Right 2012  . TUBAL LIGATION    . VENTRAL HERNIA REPAIR N/A 05/29/2018   Procedure: LAPAROSCOPIC VENTRAL / [place];  Surgeon: Johnathan Hausen, MD;  Location: [facility] ORS;  Service: General;  Laterality: N/A;    Prior to Admission medications   Medication Sig Start Date End Date Taking? Authorizing Provider  amLODipine (NORVASC) 2.5 MG tablet Take 1 tablet (2.5 mg total) by mouth daily. At 6pm 02/20/21   Spencer, Maria Glow, MD  amoxicillin-clavulanate (AUGMENTIN) 875-125 MG tablet Take 1 tablet by mouth 2 (two) times daily. With food x 10-14 days stop cipro and flagyl Patient not taking: Reported on 02/20/2021 02/14/21   Spencer, Maria Glow, MD  atorvastatin (LIPITOR) 20 MG tablet Take 1 tablet (20 mg total) by mouth every other day. D/c 10 mg pill increased dose 02/12/21   Spencer, Maria Glow, MD  Calcium Carb-Cholecalciferol ([facility] CALCIUM 600 & VITAMIN D3 PO) Take 1 tablet by mouth daily.    [provider]  cephALEXin (KEFLEX) 250 MG capsule Take 1 capsule (250 mg total) by mouth daily. 01/18/21   Vaillancourt, Aldona Bar, PA-C  fluticasone (FLONASE) 50 MCG/ACT nasal spray Place 2 sprays into both nostrils daily. Max b/l nostrils 02/06/21   Spencer, Maria Glow, MD  levothyroxine (SYNTHROID) 88 MCG tablet Take 1 tablet (88 mcg total) by mouth daily before breakfast. 30 minutes 02/06/21   Spencer, Maria Glow, MD  metoprolol succinate (TOPROL-XL) 50 MG 24 hr tablet Take 1 tablet (50 mg total) by mouth daily. Take with or immediately following a meal. 02/06/21 05/07/21  Spencer, Maria Glow, MD  Multiple Vitamin (MULTI-VITAMINS) TABS Take 1 tablet by mouth daily.     [provider]  Potassium 99 MG TABS Take 1 tablet by mouth daily.    [provider]  telmisartan (MICARDIS) 80 MG tablet Take 1 tablet (80 mg total) by mouth daily. In place of losartan 100. Take 1/2 pill day 3 days and if BP >130/>80 take 1 pill day 4 and beyond daily 02/12/21   Spencer, Maria Glow, MD  zolpidem (AMBIEN) 5 MG tablet Take 1 tablet (5 mg total) by mouth at bedtime as needed for  sleep. 02/06/21   Spencer, Maria Glow, MD    Family History  Problem Relation Age of Onset  . Arthritis Mother   . Heart disease Mother 36       stent   . Hyperlipidemia Mother   . Hypertension Mother   . Coronary artery disease Mother 84  . Arthritis Father   . Diabetes Father   . Cancer Father        colon  . AAA (abdominal aortic aneurysm) Father   . Arthritis Sister   . Hyperlipidemia Sister   . Hypertension Sister   . Cancer Brother  lung, smoker  . Mental retardation Brother   . Drug abuse Daughter        overdose in 2018   . Arthritis Sister   . Hyperlipidemia Sister   . Bladder Cancer Neg Hx   . Kidney cancer Neg Hx   . Breast cancer Neg Hx      Social History   Tobacco Use  . Smoking status: Never Smoker  . Smokeless tobacco: Never Used  Vaping Use  . Vaping Use: Never used  Substance Use Topics  . Alcohol use: Not Currently    Alcohol/week: 7.0 standard drinks    Types: 7 Glasses of wine per week  . Drug use: No    Allergies as of 02/20/2021 - Review Complete 02/20/2021  Allergen Reaction Noted  . Sulfa antibiotics Anaphylaxis, Hives, Itching, Shortness Of Breath, and Swelling 06/13/2015  . Nitrofuran derivatives Swelling 01/05/2020  . Amlodipine besylate Other (See Comments) 09/10/2019  . Nitrofurantoin Hives and Other (See Comments) 06/13/2011    Review of Systems:    All systems reviewed and negative except where noted in HPI.   Physical Exam:  There were no vitals taken for this visit. No LMP recorded. Patient is postmenopausal. Psych:  Alert and cooperative. Normal mood and affect. General:   Alert,  Well-developed, well-nourished, pleasant and cooperative in NAD Head:  Normocephalic and atraumatic. Eyes:  Sclera clear, no icterus.   Conjunctiva pink. Ears:  Normal auditory acuity. Abdomen:  Normal bowel sounds.  No bruits.  Soft, non-tender and non-distended without masses, hepatosplenomegaly or hernias noted.  No guarding or  rebound tenderness.    Neurologic:  Alert and oriented x3;  grossly normal neurologically. Psych:  Alert and cooperative. Normal mood and affect.  Imaging Studies: CT Abdomen Pelvis W Contrast  Result Date: 02/11/2021 CLINICAL DATA:  Acute abdominal pain primarily in the left lower quadrant. EXAM: CT ABDOMEN AND PELVIS WITH CONTRAST TECHNIQUE: Multidetector CT imaging of the abdomen and pelvis was performed using the standard protocol following bolus administration of intravenous contrast. CONTRAST:  136mL OMNIPAQUE IOHEXOL 300 MG/ML  SOLN COMPARISON:  January 24, 2020 FINDINGS: Lower chest: The lung bases are clear. The heart is enlarged. Hepatobiliary: The liver is normal. Normal gallbladder.There is no biliary ductal dilation. Pancreas: Normal contours without ductal dilatation. No peripancreatic fluid collection. Spleen: Unremarkable. Adrenals/Urinary Tract: --Adrenal glands: Unremarkable. --Right kidney/ureter: No hydronephrosis or radiopaque kidney stones. --Left kidney/ureter: No hydronephrosis or radiopaque kidney stones. --Urinary bladder: Unremarkable. Stomach/Bowel: --Stomach/Duodenum: No hiatal hernia or other gastric abnormality. Normal duodenal course and caliber. --Small bowel: Unremarkable. --Colon: There is severe sigmoid diverticulosis. There is extensive wall thickening of the sigmoid colon with mild adjacent fat stranding. There is no extraluminal air. No abscess formation. --Appendix: Normal. Vascular/Lymphatic: Atherosclerotic calcification is present within the non-aneurysmal abdominal aorta, without hemodynamically significant stenosis. --No retroperitoneal lymphadenopathy. --No mesenteric lymphadenopathy. --No pelvic or inguinal lymphadenopathy. Reproductive: Unremarkable Other: No ascites or free air. The abdominal wall is normal. Musculoskeletal. No acute displaced fractures. Extensive thoracolumbar fusion hardware is noted and is affectively stable. IMPRESSION: 1. Examination is  limited by extensive streak artifact from the patient's thoracolumbar fusion hardware. 2. Severe sigmoid diverticulosis with extensive wall thickening of the sigmoid colon with mild adjacent fat stranding, consistent with acute diverticulitis. No extraluminal air or abscess formation. As an underlying mass cannot be excluded, follow-up with colonoscopy is recommended following treatment of the patient's acute symptoms. 3. Cardiomegaly. Aortic Atherosclerosis (ICD10-I70.0). Electronically Signed   By: Jamie Kato.D.  On: 02/11/2021 19:38    Assessment and Plan:   Berneta Sconyers is a 75 y.o. y/o female here today to see me as a transfer of care from Maria Spencer.  She has a history of sigmoid diverticulitis and had a recurrence recently for which she was started on antibiotics.  Each episode has been confirmed by CT scan.  Have been uncomplicated.  Colonoscopy performed in June 2021 showed no other abnormalities except diverticulosis of the sigmoid colon.  This is her second episode and has improved after antibiotic I explained to her that we do not know the reason why people develop diverticulitis nor do we have a clear method of preventing it.  High-fiber diet has been shown to provide some benefit.  I did discuss upon surgical intervention to resect the sigmoid colon and I explained to her that it would be considered at a point where she feels that she cannot handle any further episodes of diverticulitis.  I would be willing to refer her to a surgeon at that point she feels she is ready to consider surgery.  Provided patient information about high-fiber diet.  Explained to her that if she were to get abdominal pain to call my office right away so that I can prescribe her antibiotics for the same.  Follow up in as needed  Dr Maria Bellows MD,MRCP(U.K)

## 2021-02-20 NOTE — Patient Instructions (Signed)
High-Fiber Eating Plan Fiber, also called dietary fiber, is a type of carbohydrate. It is found foods such as fruits, vegetables, whole grains, and beans. A high-fiber diet can have many health benefits. Your health care provider may recommend a high-fiber diet to help:  Prevent constipation. Fiber can make your bowel movements more regular.  Lower your cholesterol.  Relieve the following conditions: ? Inflammation of veins in the anus (hemorrhoids). ? Inflammation of specific areas of the digestive tract (uncomplicated diverticulosis). ? A problem of the large intestine, also called the colon, that sometimes causes pain and diarrhea (irritable bowel syndrome, or IBS).  Prevent overeating as part of a weight-loss plan.  Prevent heart disease, type 2 diabetes, and certain cancers. What are tips for following this plan? Reading food labels  Check the nutrition facts label on food products for the amount of dietary fiber. Choose foods that have 5 grams of fiber or more per serving.  The goals for recommended daily fiber intake include: ? Men (age 50 or younger): 34-38 g. ? Men (over age 50): 28-34 g. ? Women (age 50 or younger): 25-28 g. ? Women (over age 50): 22-25 g. Your daily fiber goal is _____________ g.   Shopping  Choose whole fruits and vegetables instead of processed forms, such as apple juice or applesauce.  Choose a wide variety of high-fiber foods such as avocados, lentils, oats, and kidney beans.  Read the nutrition facts label of the foods you choose. Be aware of foods with added fiber. These foods often have high sugar and sodium amounts per serving. Cooking  Use whole-grain flour for baking and cooking.  Cook with brown rice instead of white rice. Meal planning  Start the day with a breakfast that is high in fiber, such as a cereal that contains 5 g of fiber or more per serving.  Eat breads and cereals that are made with whole-grain flour instead of refined  flour or white flour.  Eat brown rice, bulgur wheat, or millet instead of white rice.  Use beans in place of meat in soups, salads, and pasta dishes.  Be sure that half of the grains you eat each day are whole grains. General information  You can get the recommended daily intake of dietary fiber by: ? Eating a variety of fruits, vegetables, grains, nuts, and beans. ? Taking a fiber supplement if you are not able to take in enough fiber in your diet. It is better to get fiber through food than from a supplement.  Gradually increase how much fiber you consume. If you increase your intake of dietary fiber too quickly, you may have bloating, cramping, or gas.  Drink plenty of water to help you digest fiber.  Choose high-fiber snacks, such as berries, raw vegetables, nuts, and popcorn. What foods should I eat? Fruits Berries. Pears. Apples. Oranges. Avocado. Prunes and raisins. Dried figs. Vegetables Sweet potatoes. Spinach. Kale. Artichokes. Cabbage. Broccoli. Cauliflower. Green peas. Carrots. Squash. Grains Whole-grain breads. Multigrain cereal. Oats and oatmeal. Brown rice. Barley. Bulgur wheat. Millet. Quinoa. Bran muffins. Popcorn. Rye wafer crackers. Meats and other proteins Navy beans, kidney beans, and pinto beans. Soybeans. Split peas. Lentils. Nuts and seeds. Dairy Fiber-fortified yogurt. Beverages Fiber-fortified soy milk. Fiber-fortified orange juice. Other foods Fiber bars. The items listed above may not be a complete list of recommended foods and beverages. Contact a dietitian for more information. What foods should I avoid? Fruits Fruit juice. Cooked, strained fruit. Vegetables Fried potatoes. Canned vegetables. Well-cooked vegetables. Grains   White bread. Pasta made with refined flour. White rice. Meats and other proteins Fatty cuts of meat. Fried chicken or fried fish. Dairy Milk. Yogurt. Cream cheese. Sour cream. Fats and oils Butters. Beverages Soft  drinks. Other foods Cakes and pastries. The items listed above may not be a complete list of foods and beverages to avoid. Talk with your dietitian about what choices are best for you. Summary  Fiber is a type of carbohydrate. It is found in foods such as fruits, vegetables, whole grains, and beans.  A high-fiber diet has many benefits. It can help to prevent constipation, lower blood cholesterol, aid weight loss, and reduce your risk of heart disease, diabetes, and certain cancers.  Increase your intake of fiber gradually. Increasing fiber too quickly may cause cramping, bloating, and gas. Drink plenty of water while you increase the amount of fiber you consume.  The best sources of fiber include whole fruits and vegetables, whole grains, nuts, seeds, and beans. This information is not intended to replace advice given to you by your health care provider. Make sure you discuss any questions you have with your health care provider. Document Revised: 03/16/2020 Document Reviewed: 03/16/2020 Elsevier Patient Education  2021 [employer]  

## 2021-02-20 NOTE — Progress Notes (Signed)
Chief Complaint  Patient presents with  . Follow-up    2 week f/u; B12 is low, wants to discuss B12 supplements.    F/u  1. BP uncontrolled on toprol 50 mg qd and micardis 80 mg qd started 02/09/21  She reports sob with exertion echo sch 03/09/21 BP at home 140s-160s/80s-100s  2. B12 260 reference range 300 in 2022 with NS and she wants to start taking B12 asks about dose 3. Diverticulitis still with 3/10 llq ab pain was 6/10 took cipro and flagyl course did not start augmentin f/u GI Dr. Vicente Males today at 3 pm   Review of Systems  Constitutional: Negative for weight loss.  HENT: Negative for hearing loss.   Respiratory: Negative for shortness of breath.   Cardiovascular: Negative for chest pain.  Gastrointestinal: Positive for abdominal pain.  Musculoskeletal: Positive for back pain and joint pain.  Skin: Negative for rash.  Neurological: Negative for headaches.  Psychiatric/Behavioral: Positive for depression. The patient has insomnia.        Sad since husband died   Past Medical History:  Diagnosis Date  . Arthritis    neck, back, left knee;   . Breast cancer ([place]) 2009   right lumpectomy s/p radiation x 1 week 2x per day only   . Chicken pox   . Chronic UTI    established with urology   . COVID-19    ? 12/2020 sob   . Diverticular disease    -osis and -itis   . Esophagitis    egd 05/17/14 see report scanned into chart  . History of kidney stones   . Hormone disorder   . Hyperlipidemia   . Hypertension    controlled well with medication;   . Hypothyroidism   . Osteoporosis   . Personal history of radiation therapy 2009   F/U right breast cancer  . Thyroid disease    hypothyroidism    Past Surgical History:  Procedure Laterality Date  . BONE GRAFT HIP ILIAC CREST     + cage left hip 10/23/17   . BREAST BIOPSY Left 2009   clip,benign  . BREAST BIOPSY Right 2009   +  . BREAST LUMPECTOMY Right 2009   2009 lumpectomy   . CARDIAC CATHETERIZATION     No stents;  [facility] doesn't recall facility +75yrs ago  . CATARACT EXTRACTION, BILATERAL Bilateral 10/2019  . COLONOSCOPY WITH PROPOFOL N/A 05/15/2020   Procedure: COLONOSCOPY WITH PROPOFOL;  Surgeon: Lin Landsman, MD;  Location: [facility] ENDOSCOPY;  Service: Gastroenterology;  Laterality: N/A;  . EYE SURGERY     b/l cataracts sch 10/2019 in [place]  . HERNIA REPAIR    . INSERTION OF MESH N/A 05/29/2018   Procedure: INSERTION OF MESH;  Surgeon: Johnathan Hausen, MD;  Location: [facility] ORS;  Service: General;  Laterality: N/A;  . JOINT REPLACEMENT    . QUADRICEPS TENDON REPAIR Right 12/05/2020   Procedure: OPEN REPAIR OF RIGHT GLUTEUS MINIMUS TENDON;  Surgeon: Corky Mull, MD;  Location: [facility] ORS;  Service: Orthopedics;  Laterality: Right;  . right gluteus medius/minimus repair Dr. Roland Rack 11/2020      Dr. Roland Rack  . SHOULDER SURGERY Left 2008, 2009   x2 rotator cuff surgeries left shoulders  . SPINAL FUSION  07/24/2020  . SPINE SURGERY  10/23/2017   spinal 09/2017 h/o scoliosis [facility] L4-S1 OLIF T10 to ililum fusion  . TONSILLECTOMY     age 58 y.o.   . TOTAL SHOULDER REPLACEMENT Right 2012  .  TUBAL LIGATION    . VENTRAL HERNIA REPAIR N/A 05/29/2018   Procedure: LAPAROSCOPIC VENTRAL / [place];  Surgeon: Johnathan Hausen, MD;  Location: [facility] ORS;  Service: General;  Laterality: N/A;   Family History  Problem Relation Age of Onset  . Arthritis Mother   . Heart disease Mother 22       stent   . Hyperlipidemia Mother   . Hypertension Mother   . Coronary artery disease Mother 22  . Arthritis Father   . Diabetes Father   . Cancer Father        colon  . AAA (abdominal aortic aneurysm) Father   . Arthritis Sister   . Hyperlipidemia Sister   . Hypertension Sister   . Cancer Brother        lung, smoker  . Mental retardation Brother   . Drug abuse Daughter        overdose in 2018   . Arthritis Sister   . Hyperlipidemia Sister   . Bladder Cancer Neg Hx   . Kidney cancer Neg Hx   . Breast  cancer Neg Hx    Social History   Socioeconomic History  . Marital status: Widowed    Spouse name: Not on file  . Number of children: Not on file  . Years of education: Not on file  . Highest education level: Not on file  Occupational History  . Not on file  Tobacco Use  . Smoking status: Never Smoker  . Smokeless tobacco: Never Used  Vaping Use  . Vaping Use: Never used  Substance and Sexual Activity  . Alcohol use: Not Currently    Alcohol/week: 7.0 standard drinks    Types: 7 Glasses of wine per week  . Drug use: No  . Sexual activity: Yes    Birth control/protection: Post-menopausal  Other Topics Concern  . Not on file  Social History Narrative   College grad   widowed husband died Aug 14, 2019    Wears seatbelt and safe in relationship    3 kids       [place]   Social Determinants of Health   Financial Resource Strain: Low Risk   . Difficulty of Paying Living Expenses: Not hard at all  Food Insecurity: No Food Insecurity  . Worried About Charity fundraiser in the Last Year: Never true  . Ran Out of Food in the Last Year: Never true  Transportation Needs: No Transportation Needs  . Lack of Transportation (Medical): No  . Lack of Transportation (Non-Medical): No  Physical Activity: Insufficiently Active  . Days of Exercise per Week: 2 days  . Minutes of Exercise per Session: 60 min  Stress: Not on file  Social Connections: Unknown  . Frequency of Communication with Friends and Family: More than three times a week  . Frequency of Social Gatherings with Friends and Family: Not on file  . Attends Religious Services: Not on file  . Active Member of Clubs or Organizations: Yes  . Attends Archivist Meetings: More than 4 times per year  . Marital Status: Widowed  Intimate Partner Violence: Not on file   Current Meds  Medication Sig  . amLODipine (NORVASC) 2.5 MG tablet Take 1 tablet (2.5 mg total) by mouth daily. At 6pm  . atorvastatin  (LIPITOR) 20 MG tablet Take 1 tablet (20 mg total) by mouth every other day. D/c 10 mg pill increased dose  . Calcium Carb-Cholecalciferol ([employer] CALCIUM 600 & VITAMIN D3  PO) Take 1 tablet by mouth daily.  . cephALEXin (KEFLEX) 250 MG capsule Take 1 capsule (250 mg total) by mouth daily.  . fluticasone (FLONASE) 50 MCG/ACT nasal spray Place 2 sprays into both nostrils daily. Max b/l nostrils  . levothyroxine (SYNTHROID) 88 MCG tablet Take 1 tablet (88 mcg total) by mouth daily before breakfast. 30 minutes  . metoprolol succinate (TOPROL-XL) 50 MG 24 hr tablet Take 1 tablet (50 mg total) by mouth daily. Take with or immediately following a meal.  . Multiple Vitamin (MULTI-VITAMINS) TABS Take 1 tablet by mouth daily.   . Potassium 99 MG TABS Take 1 tablet by mouth daily.  Marland Kitchen telmisartan (MICARDIS) 80 MG tablet Take 1 tablet (80 mg total) by mouth daily. In place of losartan 100. Take 1/2 pill day 3 days and if BP >130/>80 take 1 pill day 4 and beyond daily  . zolpidem (AMBIEN) 5 MG tablet Take 1 tablet (5 mg total) by mouth at bedtime as needed for sleep.   Allergies  Allergen Reactions  . Sulfa Antibiotics Anaphylaxis, Hives, Itching, Shortness Of Breath and Swelling    Lips & throat swelled  . Nitrofuran Derivatives Swelling    Lip swelling; "eyes get red and puffy"  . Amlodipine Besylate Other (See Comments)    Gas, bloating   Gas, bloating   . Nitrofurantoin Hives and Other (See Comments)   Recent Results (from the past 2160 hour(s))  SARS CORONAVIRUS 2 (TAT 6-24 HRS) Nasopharyngeal Nasopharyngeal Swab     Status: None   Collection Time: 12/01/20  2:42 PM   Specimen: Nasopharyngeal Swab  Result Value Ref Range   SARS Coronavirus 2 NEGATIVE NEGATIVE    Comment: (NOTE) SARS-CoV-2 target nucleic acids are NOT DETECTED.  The SARS-CoV-2 RNA is generally detectable in upper and lower respiratory specimens during the acute phase of infection. Negative results do not preclude SARS-CoV-2  infection, do not rule out co-infections with other pathogens, and should not be used as the sole basis for treatment or other patient management decisions. Negative results must be combined with clinical observations, patient history, and epidemiological information. The expected result is Negative.  Fact Sheet for Patients: SugarRoll.be  Fact Sheet for Healthcare Providers: https://www.woods-mathews.com/  This test is not yet approved or cleared by the [place] FDA and  has been authorized for detection and/or diagnosis of SARS-CoV-2 by FDA under an Emergency Use Authorization (EUA). This EUA will remain  in effect (meaning this test can be used) for the duration of the COVID-19 declaration under Se ction 564(b)(1) of the Act, 21 U.S.C. section 360bbb-3(b)(1), unless the authorization is terminated or revoked sooner.  Performed at [facility], [place] [address] [place], [place] [address]   Urinalysis, Complete     Status: Abnormal   Collection Time: 01/18/21 11:15 AM  Result Value Ref Range   Specific Gravity, UA 1.015 1.005 - 1.030   pH, UA 6.5 5.0 - 7.5   Color, UA Yellow Yellow   Appearance Ur Clear Clear   Leukocytes,UA Negative Negative   Protein,UA Negative Negative/Trace   Glucose, UA Negative Negative   Ketones, UA Negative Negative   RBC, UA Negative Negative   Bilirubin, UA Negative Negative   Urobilinogen, Ur 0.2 0.2 - 1.0 mg/dL   Nitrite, UA Positive (A) Negative   Microscopic Examination See below:   Microscopic Examination     Status: Abnormal   Collection Time: 01/18/21 11:15 AM   Urine  Result Value Ref Range  WBC, UA 6-10 (A) 0 - 5 /hpf   RBC 0-2 0 - 2 /hpf   Epithelial Cells (non renal) 0-10 0 - 10 /hpf   Renal Epithel, UA 0-10 (A) None seen /hpf   Bacteria, UA Few None seen/Few  CULTURE, URINE COMPREHENSIVE     Status: None   Collection Time: 01/18/21  1:26 PM   Specimen: Urine   UR  Result  Value Ref Range   Urine Culture, Comprehensive Final report    Organism ID, Bacteria Comment     Comment: Mixed urogenital flora 10,000-25,000 colony forming units per mL   Comprehensive metabolic panel     Status: Abnormal   Collection Time: 02/06/21 10:19 AM  Result Value Ref Range   Sodium 141 135 - 145 mEq/L   Potassium 3.9 3.5 - 5.1 mEq/L   Chloride 104 96 - 112 mEq/L   CO2 28 19 - 32 mEq/L   Glucose, Bld 105 (H) 70 - 99 mg/dL   BUN 24 (H) 6 - 23 mg/dL   Creatinine, Ser 0.54 0.40 - 1.20 mg/dL   Total Bilirubin 0.4 0.2 - 1.2 mg/dL   Alkaline Phosphatase 80 39 - 117 U/L   AST 17 0 - 37 U/L   ALT 12 0 - 35 U/L   Total Protein 6.9 6.0 - 8.3 g/dL   Albumin 4.1 3.5 - 5.2 g/dL   GFR 90.84 >60.00 mL/min    Comment: Calculated using the CKD-EPI Creatinine Equation (2021)   Calcium 9.6 8.4 - 10.5 mg/dL  Lipid panel     Status: Abnormal   Collection Time: 02/06/21 10:19 AM  Result Value Ref Range   Cholesterol 201 (H) 0 - 200 mg/dL    Comment: ATP III Classification       Desirable:  < 200 mg/dL               Borderline High:  200 - 239 mg/dL          High:  > = 240 mg/dL   Triglycerides 119.0 0.0 - 149.0 mg/dL    Comment: Normal:  <150 mg/dLBorderline High:  150 - 199 mg/dL   HDL 68.30 >39.00 mg/dL   VLDL 23.8 0.0 - 40.0 mg/dL   LDL Cholesterol 109 (H) 0 - 99 mg/dL   Total CHOL/HDL Ratio 3     Comment:                Men          Women1/2 Average Risk     3.4          3.3Average Risk          5.0          4.42X Average Risk          9.6          7.13X Average Risk          15.0          11.0                       NonHDL 132.60     Comment: NOTE:  Non-HDL goal should be 30 mg/dL higher than patient's LDL goal (i.e. LDL goal of < 70 mg/dL, would have non-HDL goal of < 100 mg/dL)  CBC with Differential/Platelet     Status: None   Collection Time: 02/06/21 10:19 AM  Result Value Ref Range   WBC 7.2 4.0 - 10.5 K/uL  RBC 4.59 3.87 - 5.11 Mil/uL   Hemoglobin 12.8 12.0 - 15.0 g/dL    HCT 39.4 36.0 - 46.0 %   MCV 85.9 78.0 - 100.0 fl   MCHC 32.4 30.0 - 36.0 g/dL   RDW 15.1 11.5 - 15.5 %   Platelets 288.0 150.0 - 400.0 K/uL   Neutrophils Relative % 76.3 43.0 - 77.0 %   Lymphocytes Relative 14.1 12.0 - 46.0 %   Monocytes Relative 5.4 3.0 - 12.0 %   Eosinophils Relative 3.3 0.0 - 5.0 %   Basophils Relative 0.9 0.0 - 3.0 %   Neutro Abs 5.5 1.4 - 7.7 K/uL   Lymphs Abs 1.0 0.7 - 4.0 K/uL   Monocytes Absolute 0.4 0.1 - 1.0 K/uL   Eosinophils Absolute 0.2 0.0 - 0.7 K/uL   Basophils Absolute 0.1 0.0 - 0.1 K/uL  Hemoglobin A1c     Status: None   Collection Time: 02/06/21 10:19 AM  Result Value Ref Range   Hgb A1c MFr Bld 5.7 4.6 - 6.5 %    Comment: Glycemic Control Guidelines for People with Diabetes:Non Diabetic:  <6%Goal of Therapy: <7%Additional Action Suggested:  >8%   TSH     Status: None   Collection Time: 02/06/21 10:19 AM  Result Value Ref Range   TSH 1.58 0.35 - 4.50 uIU/mL  Vitamin D (25 hydroxy)     Status: Abnormal   Collection Time: 02/06/21 10:19 AM  Result Value Ref Range   VITD 113.74 (HH) 30.00 - 100.00 ng/mL   Objective  Body mass index is 26.66 kg/m. Wt Readings from Last 3 Encounters:  02/20/21 132 lb (59.9 kg)  02/14/21 133 lb (60.3 kg)  02/06/21 132 lb 9.6 oz (60.1 kg)   Temp Readings from Last 3 Encounters:  02/20/21 (!) 97.5 F (36.4 C)  02/06/21 97.8 F (36.6 C) (Oral)  12/06/20 97.6 F (36.4 C) (Temporal)   BP Readings from Last 3 Encounters:  02/20/21 (!) 152/90  02/14/21 (!) 156/86  02/06/21 (!) 140/92   Pulse Readings from Last 3 Encounters:  02/20/21 89  02/14/21 80  02/06/21 86    Physical Exam Vitals and nursing note reviewed.  Constitutional:      Appearance: Normal appearance. She is well-developed and well-groomed.  HENT:     Head: Normocephalic and atraumatic.  Eyes:     Conjunctiva/sclera: Conjunctivae normal.     Pupils: Pupils are equal, round, and reactive to light.  Cardiovascular:     Rate and  Rhythm: Normal rate and regular rhythm.     Heart sounds: Normal heart sounds. No murmur heard.   Pulmonary:     Effort: Pulmonary effort is normal.     Breath sounds: Normal breath sounds.  Abdominal:     Tenderness: There is abdominal tenderness in the suprapubic area and left lower quadrant.     Comments: Mild SP ttp and LLQ ttp  Skin:    General: Skin is warm and dry.  Neurological:     General: No focal deficit present.     Mental Status: She is alert and oriented to person, place, and time. Mental status is at baseline.     Gait: Gait normal.     Comments: Walking with cane today   Psychiatric:        Attention and Perception: Attention and perception normal.        Mood and Affect: Mood and affect normal.        Speech: Speech normal.  Behavior: Behavior normal. Behavior is cooperative.        Thought Content: Thought content normal.        Cognition and Memory: Cognition and memory normal.        Judgment: Judgment normal.     Assessment  Plan  Diverticulitis 02/09/21 IMPRESSION: 1. Examination is limited by extensive streak artifact from the patient's thoracolumbar fusion hardware. 2. Severe sigmoid diverticulosis with extensive wall thickening of the sigmoid colon with mild adjacent fat stranding, consistent with acute diverticulitis. No extraluminal air or abscess formation. As an underlying mass cannot be excluded, follow-up with colonoscopy is recommended following treatment of the patient's acute symptoms. 3. Cardiomegaly.  Aortic Atherosclerosis (ICD10-I70.0).   Electronically Signed   By: Constance Holster M.D.   On: 02/11/2021 19:38  F/u GI today completed cipro/flagyl course and some but not all better  Hypertension, unspecified type - Plan: amLODipine (NORVASC) 2.5 MG tablet with micardis 80 mg qd and toprol 50 mg xl  B12 deficiency  rec b12 1000 mcg qd otc   HM Had flu shotutd prevnar had2/4/19, pna 23 10/10/18 Tdaphad  12/09/11 Declinesshingrix, hep B vaccineprev. zostervax had 2012  covid 19 vx pfizer 3/3 Never smoker Consider shingrix vaccine given Rx 07/11/20  s/p right breast lumpectomy 2009 and radiation only  -mammogram7/6/20 negative, 06/05/20 negative mammo ordered 05/2021   Pap out of age window no h/o abnormal pap  DEXAhad 6/4/14h/o osteopenia, +osteoporosis 12/27/19 06/02/20 T -3.0, -3.2, -3.7 Could not tolerate forteo had reclast influsion 11/23/20 or 11/24/2020 f/u in 1 year 2nd infusion [facility]  Colonoscopy copy obtained reviewed 01/10/09 grade 1 IH and diverticulosis -colonoscopy due at f/uwill hold for now with other multiple appts 05/15/20 tubular adenoma FH father colon cancer dx'ed 57 y.o Q 5 years  rec healthy diet and exercise Provider: Dr. Olivia Mackie McLean-Scocuzza-Internal Medicine

## 2021-02-20 NOTE — Patient Instructions (Addendum)
Contact in 2 weeks with blood pressure readings please Goal <130/<80     [facility]  [place] [address] [phone]   [facility]  [address]  [place] [address]  [phone]  [place] stands for Dietary Approaches to Stop Hypertension. The DASH eating plan is a healthy eating plan that has been shown to:  Reduce high blood pressure (hypertension).  Reduce your risk for type 2 diabetes, heart disease, and stroke.  Help with weight loss. What are tips for following this plan? Reading food labels  Check food labels for the amount of salt (sodium) per serving. Choose foods with less than 5 percent of the Daily Value of sodium. Generally, foods with less than 300 milligrams (mg) of sodium per serving fit into this eating plan.  To find whole grains, look for the word "whole" as the first word in the ingredient list. Shopping  Buy products labeled as "low-sodium" or "no salt added."  Buy fresh foods. Avoid canned foods and pre-made or frozen meals. Cooking  Avoid adding salt when cooking. Use salt-free seasonings or herbs instead of table salt or sea salt. Check with your health care provider or pharmacist before using salt substitutes.  Do not fry foods. Cook foods using healthy methods such as baking, boiling, grilling, roasting, and broiling instead.  Cook with heart-healthy oils, such as olive, canola, avocado, soybean, or sunflower oil. Meal planning  Eat a balanced diet that includes: ? 4 or more servings of fruits and 4 or more servings of vegetables each day. Try to fill one-half of your plate with fruits and vegetables. ? 6-8 servings of whole grains each day. ? Less than 6 oz (170 g) of lean meat, poultry, or fish each day. A 3-oz (85-g) serving of meat is about the same size as a deck of cards. One egg equals 1 oz (28 g). ? 2-3  servings of low-fat dairy each day. One serving is 1 cup (237 mL). ? 1 serving of nuts, seeds, or beans 5 times each week. ? 2-3 servings of heart-healthy fats. Healthy fats called omega-3 fatty acids are found in foods such as walnuts, flaxseeds, fortified milks, and eggs. These fats are also found in cold-water fish, such as sardines, salmon, and mackerel.  Limit how much you eat of: ? Canned or prepackaged foods. ? Food that is high in trans fat, such as some fried foods. ? Food that is high in saturated fat, such as fatty meat. ? Desserts and other sweets, sugary drinks, and other foods with added sugar. ? Full-fat dairy products.  Do not salt foods before eating.  Do not eat more than 4 egg yolks a week.  Try to eat at least 2 vegetarian meals a week.  Eat more home-cooked food and less restaurant, buffet, and fast food.   Lifestyle  When eating at a restaurant, ask that your food be prepared with less salt or no salt, if possible.  If you drink alcohol: ? Limit how much you use to:  0-1 drink a day for women who are not pregnant.  0-2 drinks a day for men. ? Be aware of how much alcohol is in your drink. In the [place] one drink equals one 12 oz bottle of beer (355 mL), one 5 oz glass of wine (148 mL), or one 1 oz glass of hard liquor (44 mL). General information  Avoid eating more than 2,300 mg of salt a day. If you have hypertension, you may need to reduce your sodium intake to 1,500 mg a day.  Work with your health care provider to maintain a healthy body weight or to lose weight. Ask what an ideal weight is for you.  Get at least 30 minutes of exercise that causes your heart to beat faster (aerobic exercise) most days of the week. Activities may include walking, swimming, or biking.  Work with your health care provider or dietitian to adjust your eating plan to your individual calorie needs. What foods should I eat? Fruits All fresh, dried, or frozen fruit. Canned  fruit in natural juice (without added sugar). Vegetables Fresh or frozen vegetables (raw, steamed, roasted, or grilled). Low-sodium or reduced-sodium tomato and vegetable juice. Low-sodium or reduced-sodium tomato sauce and tomato paste. Low-sodium or reduced-sodium canned vegetables. Grains Whole-grain or whole-wheat bread. Whole-grain or whole-wheat pasta. Brown rice. [name]. Bulgur. Whole-grain and low-sodium cereals. Pita bread. Low-fat, low-sodium crackers. Whole-wheat flour tortillas. Meats and other proteins Skinless chicken or [place]. Ground chicken or [place]. Pork with fat trimmed off. Fish and seafood. Egg whites. Dried beans, peas, or lentils. Unsalted nuts, nut butters, and seeds. Unsalted canned beans. Lean cuts of beef with fat trimmed off. Low-sodium, lean precooked or cured meat, such as sausages or meat loaves. Dairy Low-fat (1%) or fat-free (skim) milk. Reduced-fat, low-fat, or fat-free cheeses. Nonfat, low-sodium ricotta or cottage cheese. Low-fat or nonfat yogurt. Low-fat, low-sodium cheese. Fats and oils Soft margarine without trans fats. Vegetable oil. Reduced-fat, low-fat, or light mayonnaise and salad dressings (reduced-sodium). Canola, safflower, olive, avocado, soybean, and sunflower oils. Avocado. Seasonings and condiments Herbs. Spices. Seasoning mixes without salt. Other foods Unsalted popcorn and pretzels. Fat-free sweets. The items listed above may not be a complete list of foods and beverages you can eat. Contact a dietitian for more information. What foods should I avoid? Fruits Canned fruit in a light or heavy syrup. Fried fruit. Fruit in cream or butter sauce. Vegetables Creamed or fried vegetables. Vegetables in a cheese sauce. Regular canned vegetables (not low-sodium or reduced-sodium). Regular canned tomato sauce and paste (not low-sodium or reduced-sodium). Regular tomato and vegetable juice (not low-sodium or reduced-sodium). Maria Spencer.  Olives. Grains Baked goods made with fat, such as croissants, muffins, or some breads. Dry pasta or rice meal packs. Meats and other proteins Fatty cuts of meat. Ribs. Fried meat. Maria Spencer. Bologna, salami, and other precooked or cured meats, such as sausages or meat loaves. Fat from the back of a pig (fatback). Bratwurst. Salted nuts and seeds. Canned beans with added salt. Canned or smoked fish. Whole eggs or egg yolks. Chicken or [place] with skin. Dairy Whole or 2% milk, cream, and half-and-half. Whole or full-fat cream cheese. Whole-fat or sweetened yogurt. Full-fat cheese. Nondairy creamers. Whipped toppings. Processed cheese and cheese spreads. Fats and oils Butter. Stick margarine. Lard. Shortening. Ghee. Bacon fat. Tropical oils, such as coconut, palm kernel, or palm oil. Seasonings and condiments Onion salt, garlic salt, seasoned salt, table salt, and sea salt. Worcestershire sauce. Tartar sauce. Barbecue sauce. Teriyaki sauce. Soy sauce, including reduced-sodium. Steak sauce. Canned and packaged gravies. Fish sauce. Oyster sauce. Cocktail sauce. Store-bought horseradish. Ketchup. Mustard. Meat flavorings and tenderizers. Bouillon cubes. Hot sauces. Pre-made or packaged marinades. Pre-made or packaged taco seasonings. Relishes. Regular salad dressings. Other foods Salted popcorn and pretzels. The items listed above may not be a complete list of foods and beverages you should avoid. Contact  a dietitian for more information. Where to find more information  National Heart, Lung, and Blood Institute: https://wilson-eaton.com/  American Heart Association: www.heart.org  Academy of Nutrition and Dietetics: www.eatright.[place]: www.kidney.org Summary  The DASH eating plan is a healthy eating plan that has been shown to reduce high blood pressure (hypertension). It may also reduce your risk for type 2 diabetes, heart disease, and stroke.  When on the DASH eating plan, aim to  eat more fresh fruits and vegetables, whole grains, lean proteins, low-fat dairy, and heart-healthy fats.  With the DASH eating plan, you should limit salt (sodium) intake to 2,300 mg a day. If you have hypertension, you may need to reduce your sodium intake to 1,500 mg a day.  Work with your health care provider or dietitian to adjust your eating plan to your individual calorie needs. This information is not intended to replace advice given to you by your health care provider. Make sure you discuss any questions you have with your health care provider. Document Revised: 10/15/2019 Document Reviewed: 10/15/2019 Elsevier Patient Education  2021 [place] With Less [employer] with less salt is one way to reduce the amount of sodium you get from food. Sodium is one of the elements that make up salt. It is found naturally in foods and is also added to certain foods. Depending on your condition and overall health, your health care provider or dietitian may recommend that you reduce your sodium intake. Most people should have less than 2,300 milligrams (mg) of sodium each day. If you have high blood pressure (hypertension), you may need to limit your sodium to 1,500 mg each day. Follow the tips below to help reduce your sodium intake. What are tips for eating less sodium? Reading food labels  Check the food label before buying or using packaged ingredients. Always check the label for the serving size and sodium content.  Look for products with no more than 140 mg of sodium in one serving.  Check the % Daily Value column to see what percent of the daily recommended amount of sodium is provided in one serving of the product. Foods with 5% or less in this column are considered low in sodium. Foods with 20% or higher are considered high in sodium.  Do not choose foods with salt as one of the first three ingredients on the ingredients list. If salt is one of the first three ingredients, it  usually means the item is high in sodium.   Shopping  Buy sodium-free or low-sodium products. Look for the following words on food labels: ? Low-sodium. ? Sodium-free. ? Reduced-sodium. ? No salt added. ? Unsalted.  Always check the sodium content even if foods are labeled as low-sodium or no salt added.  Buy fresh foods. Cooking  Use herbs, seasonings without salt, and spices as substitutes for salt.  Use sodium-free baking soda when baking.  Grill, braise, or roast foods to add flavor with less salt.  Avoid adding salt to pasta, rice, or hot cereals.  Drain and rinse canned vegetables, beans, and meat before use.  Avoid adding salt when cooking sweets and desserts.  Cook with low-sodium ingredients. What foods are high in sodium? Vegetables Regular canned vegetables (not low-sodium or reduced-sodium). Sauerkraut, pickled vegetables, and relishes. Olives. [place] fries. Onion rings. Regular canned tomato sauce and paste. Regular tomato and vegetable juice. Frozen vegetables in sauces. Grains Instant hot cereals. Bread stuffing, pancake, and biscuit mixes. Croutons. Seasoned rice  or pasta mixes. Noodle soup cups. Boxed or frozen macaroni and cheese. Regular salted crackers. Self-rising flour. Rolls. Bagels. Flour tortillas and wraps. Meats and other proteins Meat or fish that is salted, canned, smoked, cured, spiced, or pickled. This includes bacon, ham, sausages, hot dogs, corned beef, chipped beef, meat loaves, salt pork, jerky, pickled herring, anchovies, regular canned tuna, and sardines. Salted nuts. Dairy Processed cheese and cheese spreads. Cheese curds. Blue cheese. Feta cheese. String cheese. Regular cottage cheese. Buttermilk. Canned milk. The items listed above may not be a complete list of foods high in sodium. Actual amounts of sodium may be different depending on processing. Contact a dietitian for more information. What foods are low in sodium? Fruits Fresh,  frozen, or canned fruit with no sauce added. Fruit juice. Vegetables Fresh or frozen vegetables with no sauce added. "No salt added" canned vegetables. "No salt added" tomato sauce and paste. Low-sodium or reduced-sodium tomato and vegetable juice. Grains Noodles, pasta, quinoa, rice. Shredded or puffed wheat or puffed rice. Regular or quick oats (not instant). Low-sodium crackers. Low-sodium bread. Whole-grain bread and whole-grain pasta. Unsalted popcorn. Meats and other proteins Fresh or frozen whole meats, poultry (not injected with sodium), and fish with no sauce added. Unsalted nuts. Dried peas, beans, and lentils without added salt. Unsalted canned beans. Eggs. Unsalted nut butters. Low-sodium canned tuna or chicken. Dairy Milk. Soy milk. Yogurt. Low-sodium cheeses, such as Swiss, Monterey Jack, [place], and [employer]. Sherbet or ice cream (keep to  cup per serving). Cream cheese. Fats and oils Unsalted butter or margarine. Other foods Homemade pudding. Sodium-free baking soda and baking powder. Herbs and spices. Low-sodium seasoning mixes. Beverages Coffee and tea. Carbonated beverages. The items listed above may not be a complete list of foods low in sodium. Actual amounts of sodium may be different depending on processing. Contact a dietitian for more information. What are some salt alternatives when cooking? The following are herbs, seasonings, and spices that can be used instead of salt to flavor your food. Herbs should be fresh or dried. Do not choose packaged mixes. Next to the name of the herb, spice, or seasoning are some examples of foods you can pair it with. Herbs  Bay leaves - Soups, meat and vegetable dishes, and spaghetti sauce.  Basil - [employer], soups, pasta, and fish dishes.  Cilantro - Meat, poultry, and vegetable dishes.  Chili powder - Marinades and Mexican dishes.  Chives - Salad dressings and potato dishes.  Cumin - Mexican dishes, couscous, and  meat dishes.  Dill - Fish dishes, sauces, and salads.  Fennel - Meat and vegetable dishes, breads, and cookies.  Garlic (do not use garlic salt) - [place] dishes, meat dishes, salad dressings, and sauces.  Marjoram - Soups, potato dishes, and meat dishes.  Oregano - Pizza and spaghetti sauce.  Parsley - Salads, soups, pasta, and meat dishes.  Rosemary - [place] dishes, salad dressings, soups, and red meats.  Saffron - Fish dishes, pasta, and some poultry dishes.  Sage - Stuffings and sauces.  Tarragon - Fish and [employer].  Thyme - Stuffing, meat, and fish dishes. Seasonings  Lemon juice - Fish dishes, poultry dishes, vegetables, and salads.  Vinegar - Salad dressings, vegetables, and fish dishes. Spices  Cinnamon - Sweet dishes, such as cakes, cookies, and puddings.  Cloves - Gingerbread, puddings, and marinades for meats.  Curry - Vegetable dishes, fish and poultry dishes, and stir-fry dishes.  Ginger - Vegetable dishes, fish dishes, and stir-fry dishes.  Nutmeg - Pasta, vegetables, poultry, fish dishes, and custard. Summary  Cooking with less salt is one way to reduce the amount of sodium that you get from food.  Buy sodium-free or low-sodium products.  Check the food label before using or buying packaged ingredients.  Use herbs, seasonings without salt, and spices as substitutes for salt in foods. This information is not intended to replace advice given to you by your health care provider. Make sure you discuss any questions you have with your health care provider. Document Revised: 11/03/2019 Document Reviewed: 11/03/2019 Elsevier Patient Education  [place].

## 2021-02-26 DIAGNOSIS — M25551 Pain in right hip: Secondary | ICD-10-CM | POA: Diagnosis not present

## 2021-02-28 DIAGNOSIS — M25551 Pain in right hip: Secondary | ICD-10-CM | POA: Diagnosis not present

## 2021-03-05 DIAGNOSIS — M1712 Unilateral primary osteoarthritis, left knee: Secondary | ICD-10-CM | POA: Insufficient documentation

## 2021-03-06 DIAGNOSIS — M25551 Pain in right hip: Secondary | ICD-10-CM | POA: Diagnosis not present

## 2021-03-07 ENCOUNTER — Encounter: Payer: Self-pay | Admitting: Internal Medicine

## 2021-03-09 ENCOUNTER — Other Ambulatory Visit: Payer: Self-pay

## 2021-03-09 ENCOUNTER — Ambulatory Visit (INDEPENDENT_AMBULATORY_CARE_PROVIDER_SITE_OTHER): Payer: Medicare Other

## 2021-03-09 DIAGNOSIS — I517 Cardiomegaly: Secondary | ICD-10-CM | POA: Diagnosis not present

## 2021-03-09 DIAGNOSIS — R0609 Other forms of dyspnea: Secondary | ICD-10-CM

## 2021-03-09 DIAGNOSIS — R0600 Dyspnea, unspecified: Secondary | ICD-10-CM

## 2021-03-09 LAB — ECHOCARDIOGRAM COMPLETE
AR max vel: 2.5 cm2
AV Area VTI: 2.2 cm2
AV Area mean vel: 2.38 cm2
AV Mean grad: 4 mmHg
AV Peak grad: 6.5 mmHg
Ao pk vel: 1.27 m/s
Area-P 1/2: 2.62 cm2
Calc EF: 56.2 %
S' Lateral: 2.9 cm
Single Plane A2C EF: 55.6 %
Single Plane A4C EF: 55.5 %

## 2021-03-11 ENCOUNTER — Other Ambulatory Visit: Payer: Self-pay | Admitting: Internal Medicine

## 2021-03-12 ENCOUNTER — Ambulatory Visit: Payer: Medicare Other | Admitting: Family

## 2021-03-12 ENCOUNTER — Telehealth: Payer: Self-pay | Admitting: Gastroenterology

## 2021-03-12 ENCOUNTER — Other Ambulatory Visit: Payer: Self-pay

## 2021-03-12 ENCOUNTER — Telehealth: Payer: Self-pay | Admitting: Internal Medicine

## 2021-03-12 MED ORDER — METRONIDAZOLE 500 MG PO TABS: 500.0000 mg | ORAL_TABLET | Freq: Two times a day (BID) | ORAL | 0 refills | Status: DC

## 2021-03-12 MED ORDER — CIPROFLOXACIN HCL 500 MG PO TABS: 500.0000 mg | ORAL_TABLET | Freq: Two times a day (BID) | ORAL | 0 refills | Status: DC

## 2021-03-12 NOTE — Telephone Encounter (Signed)
Informed patient of Dr, Georgeann Oppenheim recommendations/ comments. Rx has been sent into the pharmacy. Pt verbalized understanding.

## 2021-03-12 NOTE — Telephone Encounter (Signed)
Ok tot reschedule

## 2021-03-12 NOTE — Telephone Encounter (Signed)
Inform  1. If symptoms still present , not improving then start on Ciprofloxacin 500 mg Bid for 14 days and flagyl 500 mg TID for 14 days .   2. IF she starts antibiotics and she feels worse then must come into the hospital to get admitted for IV antibiotics.   3. If continues to get better then complete antibiotics and we can do a office visit next week .

## 2021-03-12 NOTE — Telephone Encounter (Signed)
Duplicate message. 

## 2021-03-12 NOTE — Telephone Encounter (Signed)
Patient calling  Saw note in regards to ECHO results and does not feel appointment is necessary today  Also has a stomach bug Please advise if patient should reschedule

## 2021-03-12 NOTE — Telephone Encounter (Signed)
*-*-*  This message has not been handled.*-*-*  Dr. Vicente Males, on Friday, April 15th,  I started having severe symptoms of diverticulitis again- left quadrant pain, fever, chills, nausea, painful bowel movements and even a fainting spell. This is Sunday and the symptoms are still persistent.   I live alone and could not drive myself to the ER.  What do I do?  Do you want me to come into the office or can you prescribe antibiotics to get this infection under control?  Please advise.  Thank you.  [name]  Patient called office, asks for call back regarding message above through My Chart.   Please call to advise

## 2021-03-12 NOTE — Telephone Encounter (Addendum)
Patient called back after speaking with the nurse. Would like more information on care options. Patient is considering the hospital IV option but needs a little more clarification. Please call patient

## 2021-03-13 NOTE — Telephone Encounter (Signed)
If getting worse then suggest go to ER to get evaluated , may need a CT scan when seen in ER. If she is worse may need IV antibiotics and fluids

## 2021-03-15 ENCOUNTER — Inpatient Hospital Stay
Admission: EM | Admit: 2021-03-15 | Discharge: 2021-03-20 | DRG: 872 | Disposition: A | Discharge status: Home / Self Care | Payer: Medicare Other | Attending: Internal Medicine | Admitting: Internal Medicine

## 2021-03-15 ENCOUNTER — Other Ambulatory Visit: Payer: Self-pay

## 2021-03-15 ENCOUNTER — Emergency Department: Payer: Medicare Other

## 2021-03-15 ENCOUNTER — Telehealth: Payer: Self-pay | Admitting: Gastroenterology

## 2021-03-15 DIAGNOSIS — E785 Hyperlipidemia, unspecified: Secondary | ICD-10-CM | POA: Diagnosis not present

## 2021-03-15 DIAGNOSIS — Z882 Allergy status to sulfonamides status: Secondary | ICD-10-CM

## 2021-03-15 DIAGNOSIS — G894 Chronic pain syndrome: Secondary | ICD-10-CM | POA: Diagnosis not present

## 2021-03-15 DIAGNOSIS — Z79899 Other long term (current) drug therapy: Secondary | ICD-10-CM | POA: Diagnosis not present

## 2021-03-15 DIAGNOSIS — R109 Unspecified abdominal pain: Secondary | ICD-10-CM | POA: Diagnosis not present

## 2021-03-15 DIAGNOSIS — K5720 Diverticulitis of large intestine with perforation and abscess without bleeding: Secondary | ICD-10-CM | POA: Diagnosis present

## 2021-03-15 DIAGNOSIS — Z923 Personal history of irradiation: Secondary | ICD-10-CM | POA: Diagnosis not present

## 2021-03-15 DIAGNOSIS — Z8249 Family history of ischemic heart disease and other diseases of the circulatory system: Secondary | ICD-10-CM

## 2021-03-15 DIAGNOSIS — Z79890 Hormone replacement therapy (postmenopausal): Secondary | ICD-10-CM

## 2021-03-15 DIAGNOSIS — M1990 Unspecified osteoarthritis, unspecified site: Secondary | ICD-10-CM | POA: Diagnosis present

## 2021-03-15 DIAGNOSIS — K651 Peritoneal abscess: Secondary | ICD-10-CM | POA: Diagnosis not present

## 2021-03-15 DIAGNOSIS — Z981 Arthrodesis status: Secondary | ICD-10-CM

## 2021-03-15 DIAGNOSIS — Z96611 Presence of right artificial shoulder joint: Secondary | ICD-10-CM | POA: Diagnosis present

## 2021-03-15 DIAGNOSIS — Z20822 Contact with and (suspected) exposure to covid-19: Secondary | ICD-10-CM | POA: Diagnosis present

## 2021-03-15 DIAGNOSIS — K429 Umbilical hernia without obstruction or gangrene: Secondary | ICD-10-CM | POA: Diagnosis present

## 2021-03-15 DIAGNOSIS — I10 Essential (primary) hypertension: Secondary | ICD-10-CM | POA: Diagnosis present

## 2021-03-15 DIAGNOSIS — K219 Gastro-esophageal reflux disease without esophagitis: Secondary | ICD-10-CM | POA: Diagnosis present

## 2021-03-15 DIAGNOSIS — R7303 Prediabetes: Secondary | ICD-10-CM | POA: Diagnosis present

## 2021-03-15 DIAGNOSIS — L02211 Cutaneous abscess of abdominal wall: Secondary | ICD-10-CM | POA: Diagnosis not present

## 2021-03-15 DIAGNOSIS — Z8261 Family history of arthritis: Secondary | ICD-10-CM | POA: Diagnosis not present

## 2021-03-15 DIAGNOSIS — I4581 Long QT syndrome: Secondary | ICD-10-CM | POA: Diagnosis not present

## 2021-03-15 DIAGNOSIS — L0291 Cutaneous abscess, unspecified: Secondary | ICD-10-CM | POA: Diagnosis not present

## 2021-03-15 DIAGNOSIS — Z853 Personal history of malignant neoplasm of breast: Secondary | ICD-10-CM

## 2021-03-15 DIAGNOSIS — Z8616 Personal history of COVID-19: Secondary | ICD-10-CM

## 2021-03-15 DIAGNOSIS — K5792 Diverticulitis of intestine, part unspecified, without perforation or abscess without bleeding: Secondary | ICD-10-CM

## 2021-03-15 DIAGNOSIS — Z87440 Personal history of urinary (tract) infections: Secondary | ICD-10-CM

## 2021-03-15 DIAGNOSIS — A419 Sepsis, unspecified organism: Principal | ICD-10-CM | POA: Diagnosis present

## 2021-03-15 DIAGNOSIS — B370 Candidal stomatitis: Secondary | ICD-10-CM | POA: Diagnosis not present

## 2021-03-15 DIAGNOSIS — K5780 Diverticulitis of intestine, part unspecified, with perforation and abscess without bleeding: Secondary | ICD-10-CM

## 2021-03-15 DIAGNOSIS — M810 Age-related osteoporosis without current pathological fracture: Secondary | ICD-10-CM | POA: Diagnosis present

## 2021-03-15 DIAGNOSIS — M419 Scoliosis, unspecified: Secondary | ICD-10-CM | POA: Diagnosis not present

## 2021-03-15 DIAGNOSIS — G4700 Insomnia, unspecified: Secondary | ICD-10-CM | POA: Diagnosis present

## 2021-03-15 DIAGNOSIS — I700 Atherosclerosis of aorta: Secondary | ICD-10-CM | POA: Diagnosis present

## 2021-03-15 DIAGNOSIS — E039 Hypothyroidism, unspecified: Secondary | ICD-10-CM | POA: Diagnosis not present

## 2021-03-15 DIAGNOSIS — Z888 Allergy status to other drugs, medicaments and biological substances status: Secondary | ICD-10-CM

## 2021-03-15 LAB — URINALYSIS, COMPLETE (UACMP) WITH MICROSCOPIC
Bacteria, UA: NONE SEEN
Bilirubin Urine: NEGATIVE
Glucose, UA: NEGATIVE mg/dL
Hgb urine dipstick: NEGATIVE
Ketones, ur: NEGATIVE mg/dL
Leukocytes,Ua: NEGATIVE
Nitrite: NEGATIVE
Protein, ur: NEGATIVE mg/dL
Specific Gravity, Urine: 1.045 — ABNORMAL HIGH (ref 1.005–1.030)
Squamous Epithelial / HPF: NONE SEEN (ref 0–5)
pH: 6 (ref 5.0–8.0)

## 2021-03-15 LAB — PROCALCITONIN: Procalcitonin: <0.1 ng/mL

## 2021-03-15 LAB — CBC
HCT: 41.6 % (ref 36.0–46.0)
Hemoglobin: 13.5 g/dL (ref 12.0–15.0)
MCH: 26.8 pg (ref 26.0–34.0)
MCHC: 32.5 g/dL (ref 30.0–36.0)
MCV: 82.5 fL (ref 80.0–100.0)
Platelets: 288 10*3/uL (ref 150–400)
RBC: 5.04 MIL/uL (ref 3.87–5.11)
RDW: 14.9 % (ref 11.5–15.5)
WBC: 14.4 10*3/uL — ABNORMAL HIGH (ref 4.0–10.5)
nRBC: 0 % (ref 0.0–0.2)

## 2021-03-15 LAB — LACTIC ACID, PLASMA: Lactic Acid, Venous: 0.7 mmol/L (ref 0.5–1.9)

## 2021-03-15 LAB — COMPREHENSIVE METABOLIC PANEL
ALT: 15 U/L (ref 0–44)
AST: 22 U/L (ref 15–41)
Albumin: 3.7 g/dL (ref 3.5–5.0)
Alkaline Phosphatase: 84 U/L (ref 38–126)
Anion gap: 10 (ref 5–15)
BUN: 14 mg/dL (ref 8–23)
CO2: 24 mmol/L (ref 22–32)
Calcium: 8.9 mg/dL (ref 8.9–10.3)
Chloride: 103 mmol/L (ref 98–111)
Creatinine, Ser: 0.54 mg/dL (ref 0.44–1.00)
GFR, Estimated: >60 mL/min (ref >60)
Glucose, Bld: 134 mg/dL — ABNORMAL HIGH (ref 70–99)
Potassium: 3.5 mmol/L (ref 3.5–5.1)
Sodium: 137 mmol/L (ref 135–145)
Total Bilirubin: 0.5 mg/dL (ref 0.3–1.2)
Total Protein: 7.9 g/dL (ref 6.5–8.1)

## 2021-03-15 LAB — BRAIN NATRIURETIC PEPTIDE: B Natriuretic Peptide: 39.3 pg/mL (ref 0.0–100.0)

## 2021-03-15 LAB — PROTIME-INR
INR: 1 (ref 0.8–1.2)
Prothrombin Time: 13.5 seconds (ref 11.4–15.2)

## 2021-03-15 LAB — RESP PANEL BY RT-PCR (FLU A&B, COVID) ARPGX2
Influenza A by PCR: NEGATIVE
Influenza B by PCR: NEGATIVE
SARS Coronavirus 2 by RT PCR: NEGATIVE

## 2021-03-15 LAB — LIPASE, BLOOD: Lipase: 26 U/L (ref 11–51)

## 2021-03-15 LAB — APTT: aPTT: 28 seconds (ref 24–36)

## 2021-03-15 IMAGING — CT CT ABD-PELV W/ CM
2 of 5 series · 16 of 46 positions shown, 18 images · IV contrast (omnipaque)
Comparison: [DATE]

CLINICAL DATA: Left lower quadrant abdominal pain [REDACTED], nausea,
diaphoresis at; history of breast cancer post lumpectomy and
radiation therapy, kidney stones, hypertension, diverticulitis

EXAM:
CT ABDOMEN AND PELVIS WITH CONTRAST
TECHNIQUE: Multidetector CT imaging of the abdomen and pelvis was performed
using the standard protocol following bolus administration of
intravenous contrast. Sagittal and coronal MPR images reconstructed
from axial data set.
CONTRAST:  75mL OMNIPAQUE IOHEXOL 300 MG/ML SOLN IV. No oral
contrast.

[Series 2: axial (person_name) (person_name) · axial · 0.61mm/px · z∈[-447,-57]mm · 13 of 88 slices shown, 15 images]
[im 5/88  soft-tissue]
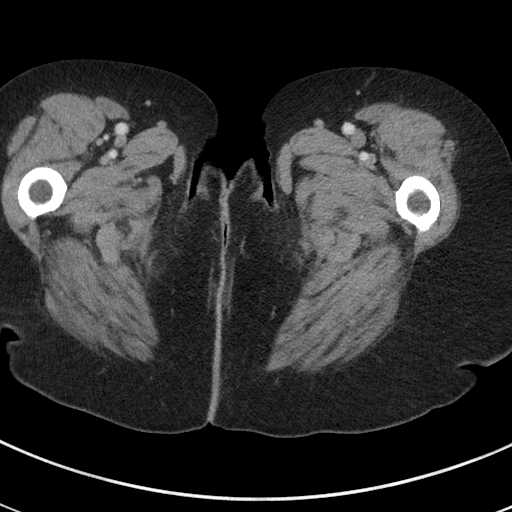
[im 5/88  bone]
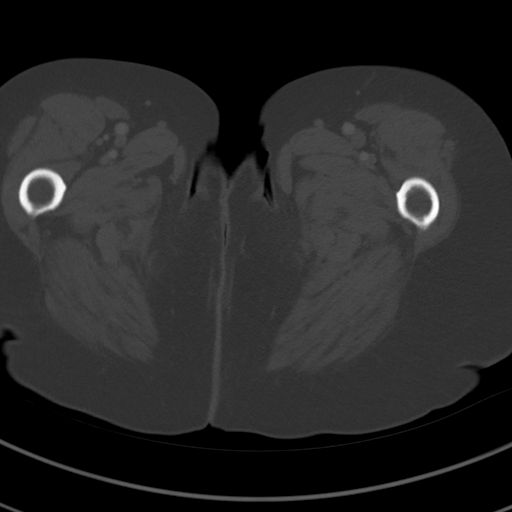
[im 14/88  soft-tissue]
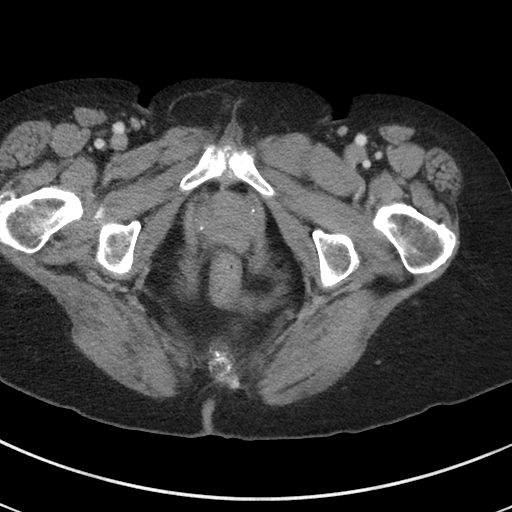
[im 19/88  soft-tissue]
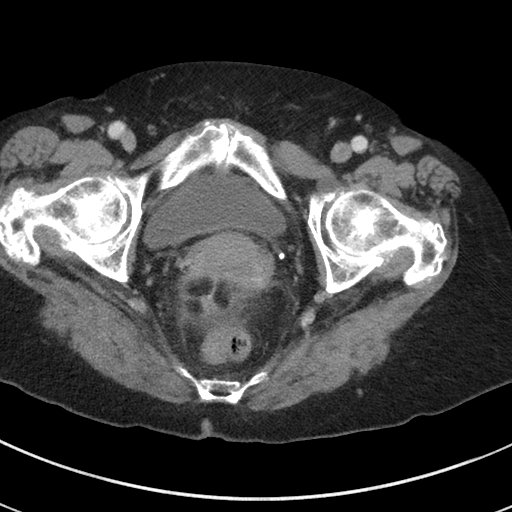
[im 23/88  soft-tissue]
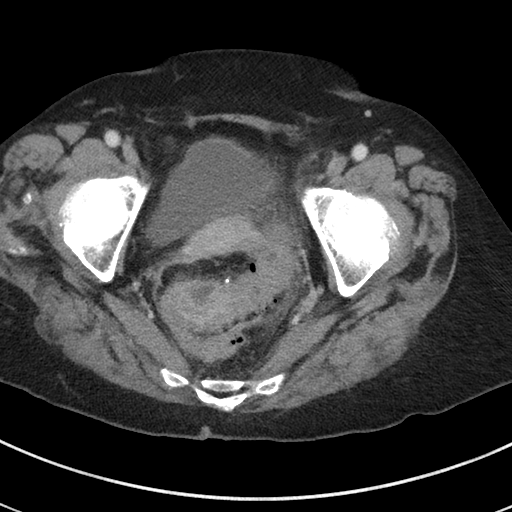
[im 33/88  soft-tissue]
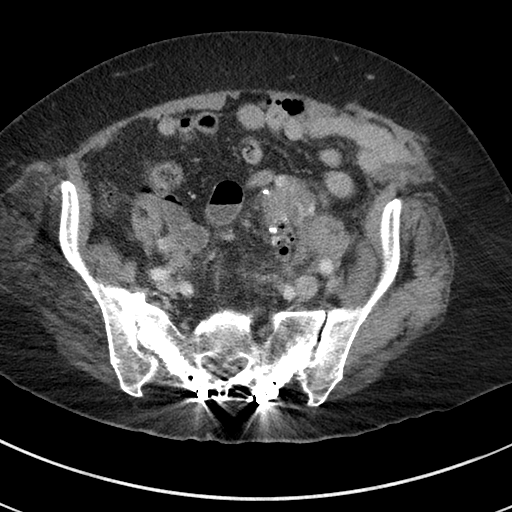
[im 37/88  soft-tissue]
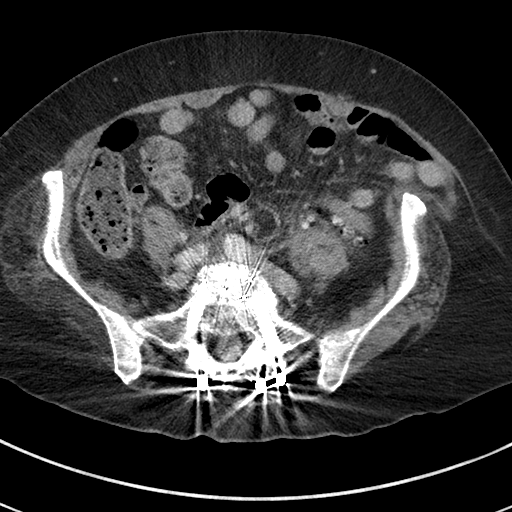
[im 46/88  soft-tissue]
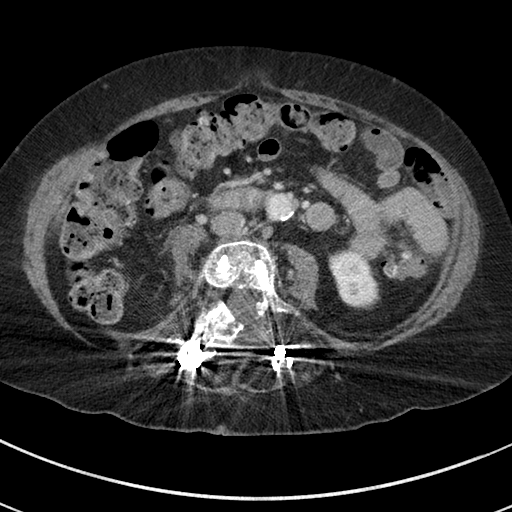
[im 51/88  soft-tissue]
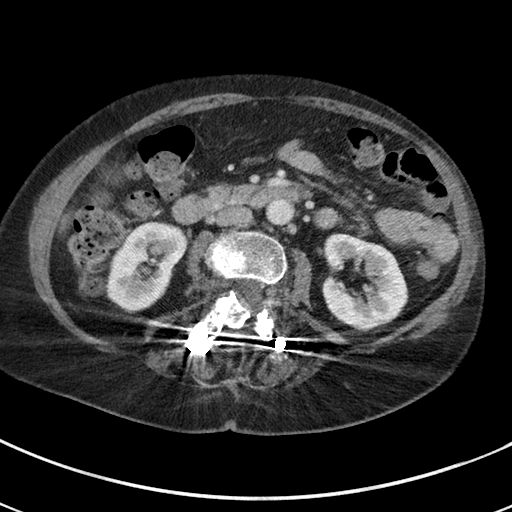
[im 55/88  soft-tissue]
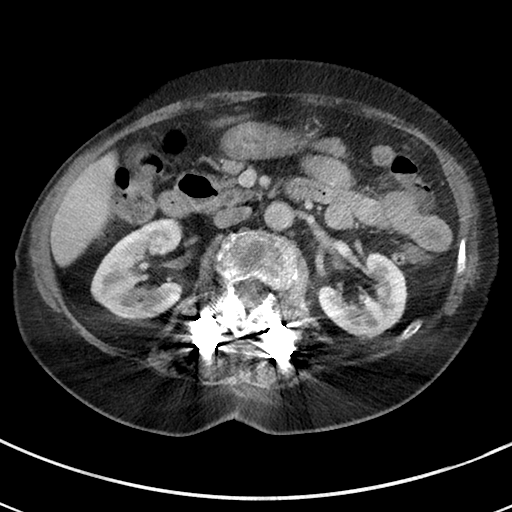
[im 55/88  bone]
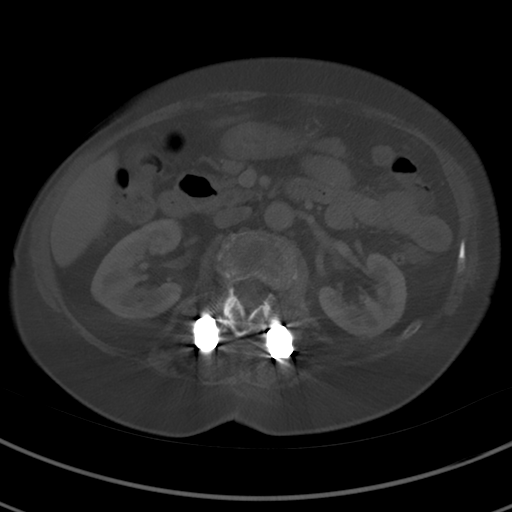
[im 65/88  soft-tissue]
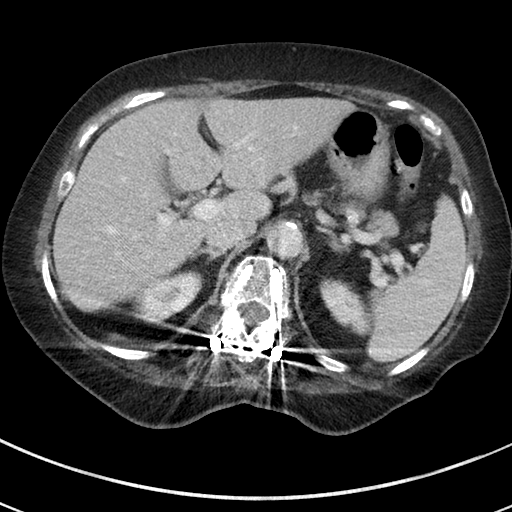
[im 69/88  soft-tissue]
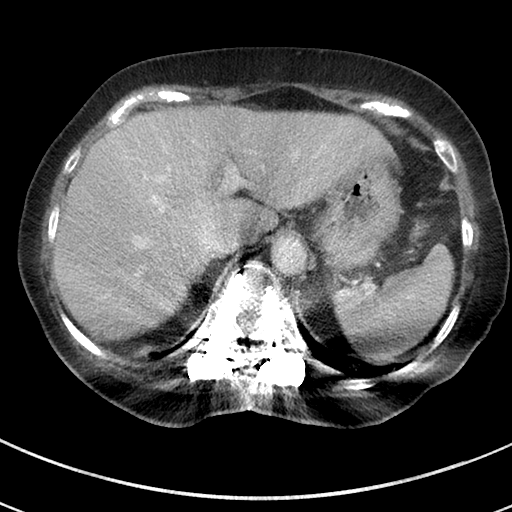
[im 74/88  soft-tissue]
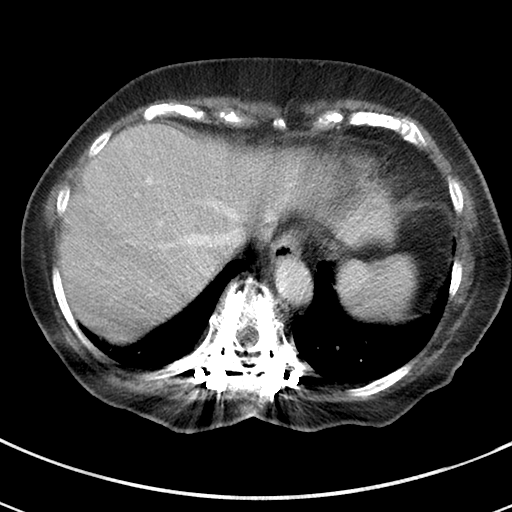
[im 83/88  soft-tissue]
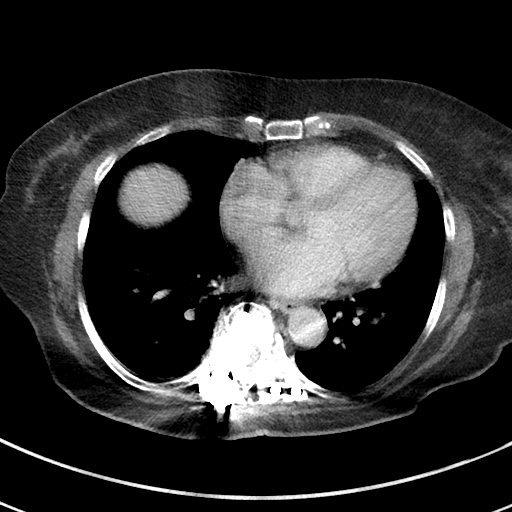

[Series 5: coronal st · coronal · 0.65mm/px · 3 of 85 slices shown]
[im 29/85  soft-tissue]
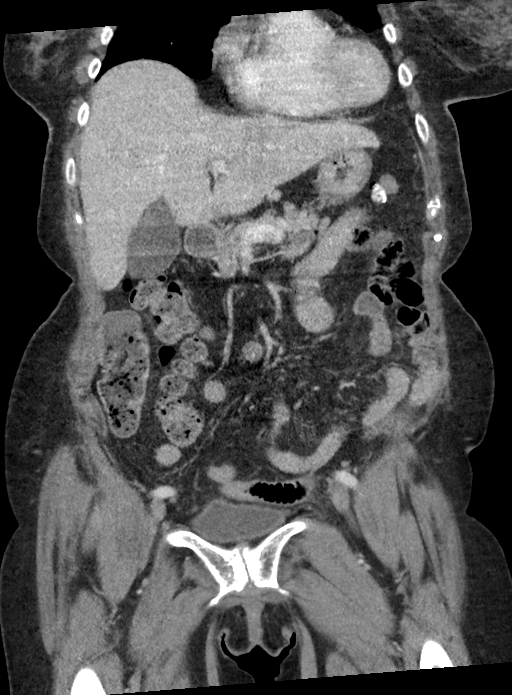
[im 38/85  soft-tissue]
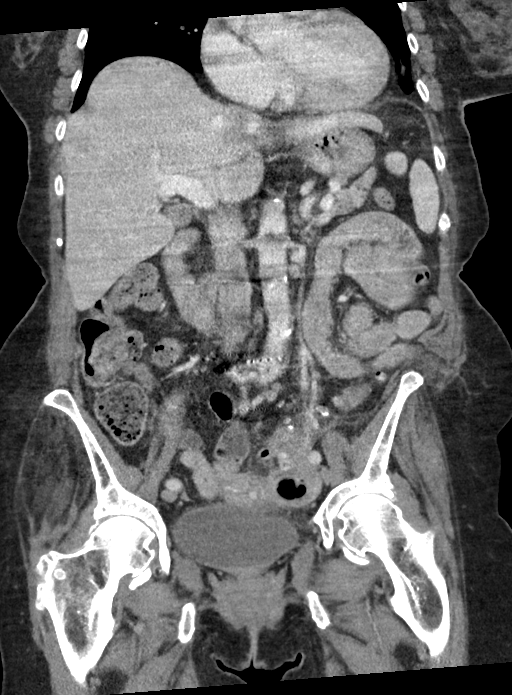
[im 47/85  soft-tissue]
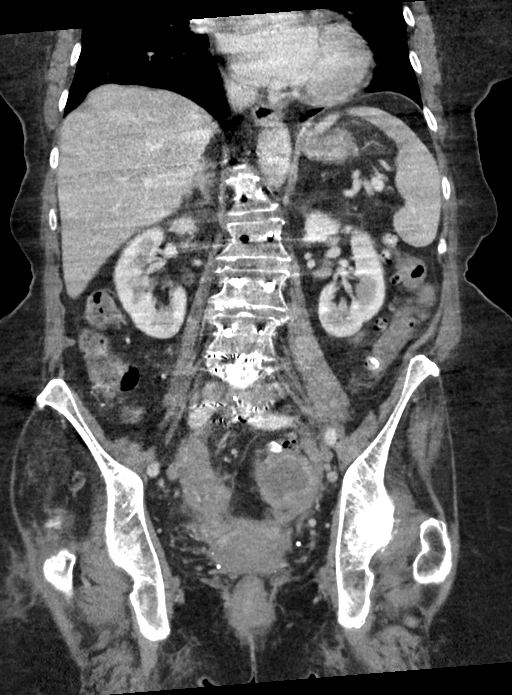

[16 of 46 positions shown; findings below may reference images not displayed]

FINDINGS: Lower chest: Lung bases clear

Hepatobiliary: Tiny cyst LEFT lobe liver. Gallbladder and liver
otherwise normal appearance

Pancreas: Normal appearance

Spleen: Normal appearance. Small splenule medial to spleen 16 x 13
mm.

Adrenals/Urinary Tract: Adrenal glands, kidneys, ureters, and
bladder normal appearance

Stomach/Bowel: Normal appendix. Duodenal diverticulum. Stomach and
small bowel loops unremarkable. Diverticulosis of descending and
sigmoid colon with wall thickening and mild pericolic inflammatory
changes at sigmoid colon consistent with diverticulitis. Gas and
fluid collection identified adjacent to sigmoid colon 3.4 x 2.5 x
3.2 cm likely small diverticular abscess. Additional small fluid
collection adjacent to more distal sigmoid colon, 1.6 x 1.5 x 1.7 cm
likely second small pericolic abscess. Associated stranding and
sigmoid mesocolon. Remainder of colon unremarkable.

Vascular/Lymphatic: Atherosclerotic calcifications aorta without
aneurysm. Vascular structures patent. No adenopathy.

Reproductive: Unremarkable uterus and adnexa

Other: Minimal free fluid in pelvis. No free air. Small umbilical
hernia containing fat.

Musculoskeletal: Prior thoracolumbosacral fusion. Bones
demineralized.
IMPRESSION: Acute sigmoid diverticulitis with 2 small pericolic abscess
collections of 3.4 cm and 1.7 cm.

Small umbilical hernia containing fat.

Aortic Atherosclerosis ([GO]-[GO]).

## 2021-03-15 MED ORDER — ATORVASTATIN CALCIUM 20 MG PO TABS
20.0000 mg | ORAL_TABLET | ORAL | Status: DC
  Administered 2021-03-16 – 2021-03-20 (×3): 20 mg via ORAL
  Filled 2021-03-15 (×4): qty 1

## 2021-03-15 MED ORDER — LEVOTHYROXINE SODIUM 88 MCG PO TABS
88.0000 ug | ORAL_TABLET | Freq: Every day | ORAL | Status: DC
  Administered 2021-03-16 – 2021-03-20 (×4): 88 ug via ORAL
  Filled 2021-03-15 (×5): qty 1

## 2021-03-15 MED ORDER — POTASSIUM CHLORIDE 2 MEQ/ML IV SOLN
INTRAVENOUS | Status: DC
  Filled 2021-03-15 (×10): qty 1000

## 2021-03-15 MED ORDER — METOPROLOL SUCCINATE ER 50 MG PO TB24
50.0000 mg | ORAL_TABLET | Freq: Every day | ORAL | Status: DC
  Administered 2021-03-15 – 2021-03-20 (×6): 50 mg via ORAL
  Filled 2021-03-15 (×6): qty 1

## 2021-03-15 MED ORDER — SENNOSIDES-DOCUSATE SODIUM 8.6-50 MG PO TABS
1.0000 | ORAL_TABLET | Freq: Every day | ORAL | Status: DC | PRN
  Administered 2021-03-16: 1 via ORAL
  Filled 2021-03-15: qty 1

## 2021-03-15 MED ORDER — VITAMIN B-12 1000 MCG PO TABS
1000.0000 ug | ORAL_TABLET | Freq: Every day | ORAL | Status: DC
  Administered 2021-03-16 – 2021-03-20 (×4): 1000 ug via ORAL
  Filled 2021-03-15 (×4): qty 1

## 2021-03-15 MED ORDER — PIPERACILLIN-TAZOBACTAM 3.375 G IVPB 30 MIN
3.3750 g | Freq: Once | INTRAVENOUS | Status: AC
  Administered 2021-03-15: 3.375 g via INTRAVENOUS
  Filled 2021-03-15: qty 50

## 2021-03-15 MED ORDER — ACETAMINOPHEN 325 MG PO TABS
650.0000 mg | ORAL_TABLET | Freq: Four times a day (QID) | ORAL | Status: DC | PRN
  Administered 2021-03-16 (×2): 650 mg via ORAL
  Filled 2021-03-15 (×2): qty 2

## 2021-03-15 MED ORDER — ADULT MULTIVITAMIN W/MINERALS CH
1.0000 | ORAL_TABLET | Freq: Every day | ORAL | Status: DC
  Administered 2021-03-16 – 2021-03-20 (×4): 1 via ORAL
  Filled 2021-03-15 (×4): qty 1

## 2021-03-15 MED ORDER — OXYCODONE-ACETAMINOPHEN 5-325 MG PO TABS
1.0000 | ORAL_TABLET | ORAL | Status: DC | PRN
  Administered 2021-03-15 (×2): 1 via ORAL
  Filled 2021-03-15 (×2): qty 1

## 2021-03-15 MED ORDER — FLUTICASONE PROPIONATE 50 MCG/ACT NA SUSP
2.0000 | Freq: Every day | NASAL | Status: DC | PRN
  Filled 2021-03-15: qty 16

## 2021-03-15 MED ORDER — ZOLPIDEM TARTRATE 5 MG PO TABS
5.0000 mg | ORAL_TABLET | Freq: Every evening | ORAL | Status: DC | PRN
  Administered 2021-03-17 – 2021-03-19 (×4): 5 mg via ORAL
  Filled 2021-03-15 (×4): qty 1

## 2021-03-15 MED ORDER — NYSTATIN NICU ORAL SYRINGE 100,000 UNITS/ML
1.0000 mL | Freq: Four times a day (QID) | OROMUCOSAL | Status: DC
  Filled 2021-03-15 (×4): qty 1

## 2021-03-15 MED ORDER — CALCIUM CARBONATE-VITAMIN D 500-200 MG-UNIT PO TABS
1.0000 | ORAL_TABLET | Freq: Every day | ORAL | Status: DC
  Administered 2021-03-16 – 2021-03-20 (×4): 1 via ORAL
  Filled 2021-03-15 (×4): qty 1

## 2021-03-15 MED ORDER — NYSTATIN 100000 UNIT/ML MT SUSP
5.0000 mL | Freq: Four times a day (QID) | OROMUCOSAL | Status: DC
  Administered 2021-03-15 – 2021-03-20 (×16): 500000 [IU] via ORAL
  Filled 2021-03-15 (×16): qty 5

## 2021-03-15 MED ORDER — PIPERACILLIN-TAZOBACTAM 3.375 G IVPB
3.3750 g | Freq: Three times a day (TID) | INTRAVENOUS | Status: DC
  Administered 2021-03-15 – 2021-03-20 (×14): 3.375 g via INTRAVENOUS
  Filled 2021-03-15 (×14): qty 50

## 2021-03-15 MED ORDER — MORPHINE SULFATE (PF) 2 MG/ML IV SOLN: 1.0000 mg | INTRAVENOUS | Status: DC | PRN

## 2021-03-15 MED ORDER — SODIUM CHLORIDE 0.9 % IV BOLUS
1000.0000 mL | Freq: Once | INTRAVENOUS | Status: AC
  Administered 2021-03-15: 1000 mL via INTRAVENOUS

## 2021-03-15 MED ORDER — ONDANSETRON HCL 4 MG/2ML IJ SOLN
4.0000 mg | Freq: Three times a day (TID) | INTRAMUSCULAR | Status: DC | PRN
  Administered 2021-03-16 – 2021-03-17 (×2): 4 mg via INTRAVENOUS
  Filled 2021-03-15 (×2): qty 2

## 2021-03-15 MED ORDER — HYDRALAZINE HCL 20 MG/ML IJ SOLN: 5.0000 mg | INTRAMUSCULAR | Status: DC | PRN

## 2021-03-15 MED ORDER — HEPARIN SODIUM (PORCINE) 5000 UNIT/ML IJ SOLN
5000.0000 [IU] | Freq: Three times a day (TID) | INTRAMUSCULAR | Status: DC
  Administered 2021-03-15 – 2021-03-20 (×14): 5000 [IU] via SUBCUTANEOUS
  Filled 2021-03-15 (×14): qty 1

## 2021-03-15 MED ORDER — HYDROMORPHONE HCL 1 MG/ML IJ SOLN
0.5000 mg | Freq: Once | INTRAMUSCULAR | Status: AC
  Administered 2021-03-15: 0.5 mg via INTRAVENOUS
  Filled 2021-03-15: qty 1

## 2021-03-15 MED ORDER — IOHEXOL 300 MG/ML  SOLN
75.0000 mL | Freq: Once | INTRAMUSCULAR | Status: AC | PRN
  Administered 2021-03-15: 75 mL via INTRAVENOUS

## 2021-03-15 MED ORDER — ONDANSETRON HCL 4 MG/2ML IJ SOLN
4.0000 mg | Freq: Once | INTRAMUSCULAR | Status: AC
  Administered 2021-03-15: 4 mg via INTRAVENOUS
  Filled 2021-03-15: qty 2

## 2021-03-15 NOTE — ED Notes (Signed)
Pt presents to the ED for LLQ abd pain that started Saturday. Pt states that she has been nausea but not actually vomited. Denies diarrhea but did state she woke up in a sweat one morning and has been taking Tylenol since then. Pt is more associated when she needs to use the restroom. Pt rates pain 3/10. Denies urinary symptoms. Pt has a hx of diverticulitis and a hernia repair. Pt is A&Ox4 and NAD.

## 2021-03-15 NOTE — Telephone Encounter (Signed)
Noted  

## 2021-03-15 NOTE — Consult Note (Addendum)
[facility] SURGICAL CONSULTATION NOTE (initial) - cpt: [id number]   HISTORY OF PRESENT ILLNESS (HPI):  75 y.o. female presented to [facility] ED today for evaluation of abdominal pain. Patient reports that she was initially diagnosed with diverticulitis in March of this year and was treated successfully with Cipro + Flagyl. She did well after this; however, she started to have return of LLQ abdominal pain about 5 days ago and started Cipro/Falgyl again 48 hours ago. Unfortunately, she has continued to feel no improvement. She endorses LLQ abdominal pain which is sharp, constant, and severe, Nothing seems to have made this better, and she tried Tylenol without significant improvement. She reports associated nausea, subjective fever, chills, and dizziness. No cough, CP, SOB, urinary changes. Her last colonoscopy was in June 2021 with Dr Marius Ditch which showed diverticulosis and a sessile polyp in the transverse colon which was a tubular adenoma. Only previous abdominal surgery is left inguinal hernia repair. Work up in the ED revealed leukocytosis to 14.4K but laboratory results were otherwise reassuring. CT Abdomen/Pelvis was concerning for acute complicated diverticulitis with small abscesses.   Surgery is consulted by emergency medicine physician Dr. Marjean Donna, MD in this context for evaluation and management of complicated diverticulitis.  PAST MEDICAL HISTORY (PMH):  Past Medical History:  Diagnosis Date  . Arthritis    neck, back, left knee;   . Breast cancer ([place]) 2009   right lumpectomy s/p radiation x 1 week 2x per day only   . Chicken pox   . Chronic UTI    established with urology   . COVID-19    ? 12/2020 sob   . Diverticular disease    -osis and -itis   . Esophagitis    egd 05/17/14 see report scanned into chart  . History of kidney stones   . Hormone disorder   . Hyperlipidemia   . Hypertension    controlled well with medication;   . Hypothyroidism   . Osteoporosis   .  Personal history of radiation therapy 2009   F/U right breast cancer  . Thyroid disease    hypothyroidism      PAST SURGICAL HISTORY ([place]):  Past Surgical History:  Procedure Laterality Date  . BONE GRAFT HIP ILIAC CREST     + cage left hip 10/23/17   . BREAST BIOPSY Left 2009   clip,benign  . BREAST BIOPSY Right 2009   +  . BREAST LUMPECTOMY Right 2009   2009 lumpectomy   . CARDIAC CATHETERIZATION     No stents; [facility] doesn't recall facility +78yrs ago  . CATARACT EXTRACTION, BILATERAL Bilateral 10/2019  . COLONOSCOPY WITH PROPOFOL N/A 05/15/2020   Procedure: COLONOSCOPY WITH PROPOFOL;  Surgeon: Lin Landsman, MD;  Location: [facility] ENDOSCOPY;  Service: Gastroenterology;  Laterality: N/A;  . EYE SURGERY     b/l cataracts sch 10/2019 in [place]  . HERNIA REPAIR    . INSERTION OF MESH N/A 05/29/2018   Procedure: INSERTION OF MESH;  Surgeon: Johnathan Hausen, MD;  Location: [facility] ORS;  Service: General;  Laterality: N/A;  . JOINT REPLACEMENT    . QUADRICEPS TENDON REPAIR Right 12/05/2020   Procedure: OPEN REPAIR OF RIGHT GLUTEUS MINIMUS TENDON;  Surgeon: Corky Mull, MD;  Location: [facility] ORS;  Service: Orthopedics;  Laterality: Right;  . right gluteus medius/minimus repair Dr. Roland Rack 11/2020      Dr. Roland Rack  . SHOULDER SURGERY Left 2008, 2009   x2 rotator cuff surgeries left shoulders  . SPINAL FUSION  07/24/2020  . SPINE SURGERY  10/23/2017   spinal 09/2017 h/o scoliosis [facility] L4-S1 OLIF T10 to ililum fusion  . TONSILLECTOMY     age 65 y.o.   . TOTAL SHOULDER REPLACEMENT Right 2012  . TUBAL LIGATION    . VENTRAL HERNIA REPAIR N/A 05/29/2018   Procedure: LAPAROSCOPIC VENTRAL / [place];  Surgeon: Johnathan Hausen, MD;  Location: [facility] ORS;  Service: General;  Laterality: N/A;     MEDICATIONS:  Prior to Admission medications   Medication Sig Start Date End Date Taking? Authorizing Provider  amoxicillin-clavulanate (AUGMENTIN) 875-125 MG tablet Take 1 tablet by mouth 2  (two) times daily. With food x 10-14 days stop cipro and flagyl Patient not taking: No sig reported 02/14/21   McLean-Scocuzza, Nino Glow, MD  atorvastatin (LIPITOR) 20 MG tablet Take 1 tablet (20 mg total) by mouth every other day. D/c 10 mg pill increased dose Patient not taking: Reported on 02/20/2021 02/12/21   McLean-Scocuzza, Nino Glow, MD  Calcium Carb-Cholecalciferol ([facility] CALCIUM 600 & VITAMIN D3 PO) Take 1 tablet by mouth daily.    [provider]  cephALEXin (KEFLEX) 250 MG capsule Take 1 capsule (250 mg total) by mouth daily. 01/18/21   Vaillancourt, Aldona Bar, PA-C  ciprofloxacin (CIPRO) 500 MG tablet Take 1 tablet (500 mg total) by mouth 2 (two) times daily for 14 days. 03/12/21 03/26/21  [name], MD  fluticasone [facility]) 50 MCG/ACT nasal spray Place 2 sprays into both nostrils daily. Max b/l nostrils 02/06/21   McLean-Scocuzza, Nino Glow, MD  levothyroxine (SYNTHROID) 88 MCG tablet Take 1 tablet (88 mcg total) by mouth daily before breakfast. 30 minutes 02/06/21   McLean-Scocuzza, Nino Glow, MD  metoprolol succinate (TOPROL-XL) 50 MG 24 hr tablet Take 1 tablet (50 mg total) by mouth daily. Take with or immediately following a meal. 02/06/21 05/07/21  McLean-Scocuzza, Nino Glow, MD  metroNIDAZOLE (FLAGYL) 500 MG tablet Take 1 tablet (500 mg total) by mouth 2 (two) times daily for 14 days. 03/12/21 03/26/21  [name], MD  Multiple Vitamin (MULTI-VITAMINS) TABS Take 1 tablet by mouth daily.     [provider]  Potassium 99 MG TABS Take 1 tablet by mouth daily.    [provider]  telmisartan (MICARDIS) 80 MG tablet Take 1 tablet (80 mg total) by mouth daily. In place of losartan 100. Take 1/2 pill day 3 days and if BP >130/>80 take 1 pill day 4 and beyond daily 02/12/21   McLean-Scocuzza, Nino Glow, MD  tiZANidine (ZANAFLEX) 2 MG tablet Take 2 mg by mouth at bedtime. 03/08/21   [provider]  zolpidem (AMBIEN) 5 MG tablet Take 1 tablet (5 mg total) by mouth at bedtime as  needed for sleep. 02/06/21   McLean-Scocuzza, Nino Glow, MD     ALLERGIES:  Allergies  Allergen Reactions  . Sulfa Antibiotics Anaphylaxis, Hives, Itching, Shortness Of Breath and Swelling    Lips & throat swelled  . Nitrofuran Derivatives Swelling    Lip swelling; "eyes get red and puffy"  . Amlodipine Besylate Other (See Comments)    Gas, bloating   Gas, bloating   . Nitrofurantoin Hives and Other (See Comments)     SOCIAL HISTORY:  Social History   Socioeconomic History  . Marital status: Widowed    Spouse name: Not on file  . Number of children: Not on file  . Years of education: Not on file  . Highest education level: Not on file  Occupational History  .  Not on file  Tobacco Use  . Smoking status: Never Smoker  . Smokeless tobacco: Never Used  Vaping Use  . Vaping Use: Never used  Substance and Sexual Activity  . Alcohol use: Not Currently    Alcohol/week: 7.0 standard drinks    Types: 7 Glasses of wine per week  . Drug use: No  . Sexual activity: Yes    Birth control/protection: Post-menopausal  Other Topics Concern  . Not on file  Social History Narrative   College grad   widowed husband died Jul 20, 2019    Wears seatbelt and safe in relationship    3 kids       [place]   Social Determinants of Health   Financial Resource Strain: Low Risk   . Difficulty of Paying Living Expenses: Not hard at all  Food Insecurity: No Food Insecurity  . Worried About Charity fundraiser in the Last Year: Never true  . Ran Out of Food in the Last Year: Never true  Transportation Needs: No Transportation Needs  . Lack of Transportation (Medical): No  . Lack of Transportation (Non-Medical): No  Physical Activity: Insufficiently Active  . Days of Exercise per Week: 2 days  . Minutes of Exercise per Session: 60 min  Stress: Not on file  Social Connections: Unknown  . Frequency of Communication with Friends and Family: More than three times a week  . Frequency  of Social Gatherings with Friends and Family: Not on file  . Attends Religious Services: Not on file  . Active Member of Clubs or Organizations: Yes  . Attends Archivist Meetings: More than 4 times per year  . Marital Status: Widowed  Intimate Partner Violence: Not on file     FAMILY HISTORY:  Family History  Problem Relation Age of Onset  . Arthritis Mother   . Heart disease Mother 34       stent   . Hyperlipidemia Mother   . Hypertension Mother   . Coronary artery disease Mother 43  . Arthritis Father   . Diabetes Father   . Cancer Father        colon  . AAA (abdominal aortic aneurysm) Father   . Arthritis Sister   . Hyperlipidemia Sister   . Hypertension Sister   . Cancer Brother        lung, smoker  . Mental retardation Brother   . Drug abuse Daughter        overdose in 2018   . Arthritis Sister   . Hyperlipidemia Sister   . Bladder Cancer Neg Hx   . Kidney cancer Neg Hx   . Breast cancer Neg Hx       REVIEW OF SYSTEMS:  Review of Systems  Constitutional: Positive for chills and fever (Subjective).  HENT: Negative for congestion and sore throat.   Respiratory: Negative for cough and shortness of breath.   Cardiovascular: Negative for chest pain and palpitations.  Gastrointestinal: Positive for abdominal pain and nausea. Negative for blood in stool, melena and vomiting.  Genitourinary: Negative for dysuria, hematuria and urgency.  Neurological: Positive for dizziness. Negative for headaches.  All other systems reviewed and are negative.   VITAL SIGNS:  Temp:  [97.7 F (36.5 C)] 97.7 F (36.5 C) (04/21 0925) Pulse Rate:  [73-98] 73 (04/21 1051) Resp:  [16-18] 16 (04/21 1051) BP: (138-175)/(76-89) 138/76 (04/21 1051) SpO2:  [98 %-99 %] 98 % (04/21 1051) Weight:  [58.1 kg] 58.1 kg (04/21 0924)  Height: 4\' 11"  (149.9 cm) Weight: 58.1 kg BMI (Calculated): 25.84   INTAKE/OUTPUT:  No intake/output data recorded.  PHYSICAL EXAM:  Physical  Exam Vitals and nursing note reviewed. Exam conducted with a chaperone present.  Constitutional:      General: She is not in acute distress.    Appearance: She is well-developed and normal weight. She is not ill-appearing.  HENT:     Head: Normocephalic and atraumatic.  Eyes:     General: No scleral icterus.    Extraocular Movements: Extraocular movements intact.  Cardiovascular:     Rate and Rhythm: Normal rate.     Heart sounds: Normal heart sounds. No murmur heard.   Pulmonary:     Effort: Pulmonary effort is normal. No respiratory distress.  Abdominal:     General: A surgical scar is present. There is no distension.     Palpations: Abdomen is soft.     Tenderness: There is abdominal tenderness in the suprapubic area and left lower quadrant. There is no guarding or rebound.     Comments: Abdomen is soft, she is tender mostly in the suprapubic region and LLQ, non-distended, no rebound./guarding. No peritonitis. Previous left inguinal hernia scar seen  Genitourinary:    Comments: Deferred Skin:    General: Skin is warm and dry.     Coloration: Skin is not jaundiced or pale.  Neurological:     General: No focal deficit present.     Mental Status: She is alert and oriented to person, place, and time.  Psychiatric:        Mood and Affect: Mood normal.        Behavior: Behavior normal.      Labs:  CBC Latest Ref Rng & Units 03/15/2021 02/06/2021 01/05/2020  WBC 4.0 - 10.5 K/uL 14.4(H) 7.2 4.8  Hemoglobin 12.0 - 15.0 g/dL 13.5 12.8 13.7  Hematocrit 36.0 - 46.0 % 41.6 39.4 43.1  Platelets 150 - 400 K/uL 288 288.0 211   CMP Latest Ref Rng & Units 03/15/2021 02/06/2021 05/08/2020  Glucose 70 - 99 mg/dL 134(H) 105(H) 102(H)  BUN 8 - 23 mg/dL 14 24(H) 24(H)  Creatinine 0.44 - 1.00 mg/dL 0.54 0.54 0.54  Sodium 135 - 145 mmol/L 137 141 138  Potassium 3.5 - 5.1 mmol/L 3.5 3.9 4.0  Chloride 98 - 111 mmol/L 103 104 102  CO2 22 - 32 mmol/L 24 28 26   Calcium 8.9 - 10.3 mg/dL 8.9 9.6 9.5   Total Protein 6.5 - 8.1 g/dL 7.9 6.9 7.2  Total Bilirubin 0.3 - 1.2 mg/dL 0.5 0.4 0.9  Alkaline Phos 38 - 126 U/L 84 80 97  AST 15 - 41 U/L 22 17 19   ALT 0 - 44 U/L 15 12 15      Imaging studies:   CT Abdomen/Pelvis (03/15/2021) personally reviewed showing diverticulitis of the descending/sigmoid colon with two small abscesses which do not appear amenable to percutaneous approach, no free air, and radiologist repot reviewed below:  IMPRESSION: Acute sigmoid diverticulitis with 2 small pericolic abscess collections of 3.4 cm and 1.7 cm.  Small umbilical hernia containing fat.  Aortic Atherosclerosis (ICD10-I70.0).   Assessment/Plan: (ICD-10's: K26.20) 75 y.o. female with abdominal pain and leukocytosis found to have acute diverticulitis with abscess, complicated by multiple pertinent comorbidities.   - Appreciate medicine admission   - I did review patient's CT scan with IR in regards to amenability to percutaneous approach, and they agree that this abscess is unfortunately not amenable to percutaneous drainage given its  proximity to the bowel and iliac vessels. May need benefit from CT in 72 hours pending clinical condition.   - No emergent surgical intervention; she understands that should she clinically deteriorate or fail conservative measures we would need to consider more urgent intervention including likely temporizing colostomy  - NPO + IVF resuscitation  - Agree with IV Abx (Zosyn)  - Monitor abdominal examination; on-going bowel function  - Pain control prn; antiemetics prn  - Monitor leukocytosis; trend   - Further management per primary service; we will of course follow   All of the above findings and recommendations were discussed with the patient, and all of patient's questions were answered to her expressed satisfaction.  Thank you for the opportunity to participate in this patient's care.   -- Edison Simon, PA-C [facility] 03/15/2021, 11:58  AM [phone] M-F: 7am - 4pm

## 2021-03-15 NOTE — ED Notes (Signed)
Pt ambulatory to the restroom at this time.  

## 2021-03-15 NOTE — Progress Notes (Signed)
CODE SEPSIS - PHARMACY COMMUNICATION  **Broad Spectrum Antibiotics should be administered within 1 hour of Sepsis diagnosis**  Time Code Sepsis Called/Page Received: 11:42  Antibiotics Ordered: Zosyn  Time of 1st antibiotic administration: 12:01  Additional action taken by pharmacy: n/a  If necessary, Name of Provider/Nurse Contacted: n/a    Vira Blanco ,PharmD Clinical Pharmacist  03/15/2021  1:14 PM

## 2021-03-15 NOTE — ED Triage Notes (Signed)
Pt comes with c/o severe case of diverticulitis. Pt states she has been on antibiotics for two days. Pt states pain is just unbearable.

## 2021-03-15 NOTE — ED Provider Notes (Signed)
[facility] Emergency Department Provider Note  ____________________________________________   Event Date/Time   First MD Initiated Contact with Patient 03/15/21 0932     (approximate)  I have reviewed the triage vital signs and the nursing notes.   HISTORY  Chief Complaint Abdominal Pain    HPI Maria Spencer is a 75 y.o. female with hypertension, diverticulitis who comes in with concerns for diverticulitis flare.  Patient reports having been diagnosed with diverticulitis back in March.  She finished a course of antibiotics and felt better.  She started to feel unwell again 5 days ago and will start on antibiotics 2 days ago of Cipro and Flagyl.  Even with the antibiotic she has developed worsening pain in the left lower quadrant, severe, constant, nothing makes better, nothing makes it worse.  She does report a little bit of nausea and hot flashes with it.  Patient was told by GI Dr. Vicente Males that if her symptoms are not getting better on the antibiotics to come into the hospital to be admitted for IV antibiotics.  Patient denies any chest pain or shortness of breath.  The pain is located the left lower quadrant.  She is been taking Tylenol only for pain.          Past Medical History:  Diagnosis Date  . Arthritis    neck, back, left knee;   . Breast cancer ([place]) 2009   right lumpectomy s/p radiation x 1 week 2x per day only   . Chicken pox   . Chronic UTI    established with urology   . COVID-19    ? 12/2020 sob   . Diverticular disease    -osis and -itis   . Esophagitis    egd 05/17/14 see report scanned into chart  . History of kidney stones   . Hormone disorder   . Hyperlipidemia   . Hypertension    controlled well with medication;   . Hypothyroidism   . Osteoporosis   . Personal history of radiation therapy 2009   F/U right breast cancer  . Thyroid disease    hypothyroidism     Patient Active Problem List   Diagnosis Date Noted  .  Prediabetes 02/06/2021  . Tear of right gluteus medius tendon 12/05/2020  . Chronic bursitis of shoulder area (Right) 11/09/2020  . Chronic shoulder pain (Right) 11/09/2020  . Shoulder blade pain (Right) 11/09/2020  . Scoliosis of thoracic spine, unspecified scoliosis type 11/08/2020  . 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/20/2017) 11/08/2020  . Spondylosis without myelopathy or radiculopathy, cervical region 10/26/2020  . Abnormal MRI, cervical spine (09/26/2020) 10/23/2020  . Cervical facet syndrome (Bilateral) 10/23/2020  . Cervicogenic headache (Bilateral) 10/23/2020  . Occipital neuralgia (greater occipital nerve) (Bilateral) 10/23/2020  . Cervico-occipital neuralgia (Bilateral) 10/23/2020  . Cervical facet hypertrophy (Multilevel) (Bilateral) 10/23/2020  . Tear of right gluteus minimus tendon 10/06/2020  . Osteoarthritis of AC (acromioclavicular) joints (Bilateral) 10/05/2020  . Chronic Acromioclavicular (AC) joint pain (Bilateral) 10/05/2020  . Osteoarthritis of glenohumeral joint (Left) 10/05/2020  . Chronic shoulder pain (Bilateral) (R>L) 10/02/2020  . Chronic shoulder pain after shoulder replacement (Right) 10/02/2020  . Cervicalgia 09/13/2020  . Chronic upper extremity pain (Right) 09/13/2020  . Cervical spondylosis with radiculopathy (Right) 09/13/2020  . Cervical radiculopathy at C7 (Right) 09/13/2020  . Atypical chest pain 07/13/2020  . 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 Exertional dyspnea 07/13/2020  . Palpitation 07/13/2020  . GERD (gastroesophageal reflux disease) 07/13/2020  . Insomnia  07/11/2020  . Breast cancer ([place]) 06/22/2020  . 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) 06/22/2020  . DDD (degenerative disc disease), cervical 06/22/2020  . DDD (degenerative disc disease), thoracic 06/22/2020  . DDD (degenerative disc disease), lumbar 06/22/2020  . Displacement of thoracic intervertebral disc (C6-T3, T4-5, T6-7, T8-9) 06/22/2020  .  Non-traumatic compression fracture of T8 thoracic vertebra, sequela 06/22/2020  . Spondylosis without myelopathy or radiculopathy, thoracic region 05/11/2020  . Closed wedge compression fracture of T8 vertebra ([place]) 05/11/2020  . Osteoarthritis of hips (Bilateral) 05/11/2020  . Abnormal thoracolumbar CT myelogram (01/05/2020) 05/11/2020  . Vitamin D deficiency 05/11/2020  . Chronic pain syndrome 05/08/2020  . Colon cancer screening 05/08/2020  . Disorder of skeletal system 05/08/2020  . Problems influencing health status 05/08/2020  . Chronic lower extremity pain (3ry area of Pain) (Right) 05/08/2020  . Chronic knee pain (4th area of Pain) (Posterior) (Left) 05/08/2020  . Thoracic facet syndrome (Bilateral) 05/08/2020  . Chronic sacroiliac joint pain (Bilateral) 05/08/2020  . Osteoporosis 01/14/2020  . Chronic low back pain with sciatica 01/14/2020  . Chronic thoracic back pain (1ry area of Pain) (Bilateral) (R>L) 01/14/2020  . Fusion of spine of lumbosacral region 12/13/2019  . Lumbosacral spondylosis with radiculopathy 12/13/2019  . S/P LASIK (laser assisted in situ keratomileusis) of both eyes 11/01/2019  . Aortic atherosclerosis ([place]) 09/17/2019  . Chronic hip pain (2ry area of Pain) (Bilateral) (R>L) 09/13/2019  . Age-related nuclear cataract of right eye 08/19/2019  . Age-related nuclear cataract of left eye 08/19/2019  . 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 Arthritis 01/07/2018  . Chronic UTI 12/31/2017  . IBS (irritable bowel syndrome) 12/29/2017  . History of breast cancer 12/29/2017  . Baker's cyst of knee, left 12/29/2017  . Scoliosis of cervical region due to degenerative disease of spine in adult 09/18/2017  . Hypothyroidism 02/15/2013  . Hyperlipidemia 02/15/2013  . Elevated blood pressure reading with diagnosis of hypertension 02/15/2013  . Essential hypertension 02/15/2013    Past Surgical History:  Procedure Laterality  Date  . BONE GRAFT HIP ILIAC CREST     + cage left hip 10/23/17   . BREAST BIOPSY Left 2009   clip,benign  . BREAST BIOPSY Right 2009   +  . BREAST LUMPECTOMY Right 2009   2009 lumpectomy   . CARDIAC CATHETERIZATION     No stents; [place] doesn't recall facility +22yrs ago  . CATARACT EXTRACTION, BILATERAL Bilateral 10/2019  . COLONOSCOPY WITH PROPOFOL N/A 05/15/2020   Procedure: COLONOSCOPY WITH PROPOFOL;  Surgeon: Lin Landsman, MD;  Location: [facility] ENDOSCOPY;  Service: Gastroenterology;  Laterality: N/A;  . EYE SURGERY     b/l cataracts sch 10/2019 in [place]  . HERNIA REPAIR    . INSERTION OF MESH N/A 05/29/2018   Procedure: INSERTION OF MESH;  Surgeon: Johnathan Hausen, MD;  Location: [facility] ORS;  Service: General;  Laterality: N/A;  . JOINT REPLACEMENT    . QUADRICEPS TENDON REPAIR Right 12/05/2020   Procedure: OPEN REPAIR OF RIGHT GLUTEUS MINIMUS TENDON;  Surgeon: Corky Mull, MD;  Location: [facility] ORS;  Service: Orthopedics;  Laterality: Right;  . right gluteus medius/minimus repair Dr. Roland Rack 11/2020      Dr. Roland Rack  . SHOULDER SURGERY Left 2008, 2009   x2 rotator cuff surgeries left shoulders  . SPINAL FUSION  07/24/2020  . SPINE SURGERY  10/23/2017   spinal 09/2017 h/o scoliosis [facility] L4-S1 OLIF T10 to ililum fusion  .  TONSILLECTOMY     age 68 y.o.   . TOTAL SHOULDER REPLACEMENT Right 2012  . TUBAL LIGATION    . VENTRAL HERNIA REPAIR N/A 05/29/2018   Procedure: LAPAROSCOPIC VENTRAL / [place];  Surgeon: Johnathan Hausen, MD;  Location: [facility] ORS;  Service: General;  Laterality: N/A;    Prior to Admission medications   Medication Sig Start Date End Date Taking? Authorizing Provider  amoxicillin-clavulanate (AUGMENTIN) 875-125 MG tablet Take 1 tablet by mouth 2 (two) times daily. With food x 10-14 days stop cipro and flagyl Patient not taking: No sig reported 02/14/21   McLean-Scocuzza, Nino Glow, MD  atorvastatin (LIPITOR) 20 MG tablet Take 1 tablet (20 mg total) by  mouth every other day. D/c 10 mg pill increased dose Patient not taking: Reported on 02/20/2021 02/12/21   McLean-Scocuzza, Nino Glow, MD  Calcium Carb-Cholecalciferol ([employer] CALCIUM 600 & VITAMIN D3 PO) Take 1 tablet by mouth daily.    [provider]  cephALEXin (KEFLEX) 250 MG capsule Take 1 capsule (250 mg total) by mouth daily. 01/18/21   Vaillancourt, Aldona Bar, PA-C  ciprofloxacin (CIPRO) 500 MG tablet Take 1 tablet (500 mg total) by mouth 2 (two) times daily for 14 days. 03/12/21 03/26/21  [name], MD  fluticasone [facility]) 50 MCG/ACT nasal spray Place 2 sprays into both nostrils daily. Max b/l nostrils 02/06/21   McLean-Scocuzza, Nino Glow, MD  levothyroxine (SYNTHROID) 88 MCG tablet Take 1 tablet (88 mcg total) by mouth daily before breakfast. 30 minutes 02/06/21   McLean-Scocuzza, Nino Glow, MD  metoprolol succinate (TOPROL-XL) 50 MG 24 hr tablet Take 1 tablet (50 mg total) by mouth daily. Take with or immediately following a meal. 02/06/21 05/07/21  McLean-Scocuzza, Nino Glow, MD  metroNIDAZOLE (FLAGYL) 500 MG tablet Take 1 tablet (500 mg total) by mouth 2 (two) times daily for 14 days. 03/12/21 03/26/21  [name], MD  Multiple Vitamin (MULTI-VITAMINS) TABS Take 1 tablet by mouth daily.     [provider]  Potassium 99 MG TABS Take 1 tablet by mouth daily.    [provider]  telmisartan (MICARDIS) 80 MG tablet Take 1 tablet (80 mg total) by mouth daily. In place of losartan 100. Take 1/2 pill day 3 days and if BP >130/>80 take 1 pill day 4 and beyond daily 02/12/21   McLean-Scocuzza, Nino Glow, MD  tiZANidine (ZANAFLEX) 2 MG tablet Take 2 mg by mouth at bedtime. 03/08/21   [provider]  zolpidem (AMBIEN) 5 MG tablet Take 1 tablet (5 mg total) by mouth at bedtime as needed for sleep. 02/06/21   McLean-Scocuzza, Nino Glow, MD    Allergies Sulfa antibiotics, Nitrofuran derivatives, Amlodipine besylate, and Nitrofurantoin  Family History  Problem Relation Age of Onset  .  Arthritis Mother   . Heart disease Mother 53       stent   . Hyperlipidemia Mother   . Hypertension Mother   . Coronary artery disease Mother 18  . Arthritis Father   . Diabetes Father   . Cancer Father        colon  . AAA (abdominal aortic aneurysm) Father   . Arthritis Sister   . Hyperlipidemia Sister   . Hypertension Sister   . Cancer Brother        lung, smoker  . Mental retardation Brother   . Drug abuse Daughter        overdose in 2018   . Arthritis Sister   . Hyperlipidemia Sister   .  Bladder Cancer Neg Hx   . Kidney cancer Neg Hx   . Breast cancer Neg Hx     Social History Social History   Tobacco Use  . Smoking status: Never Smoker  . Smokeless tobacco: Never Used  Vaping Use  . Vaping Use: Never used  Substance Use Topics  . Alcohol use: Not Currently    Alcohol/week: 7.0 standard drinks    Types: 7 Glasses of wine per week  . Drug use: No      Review of Systems Constitutional: + fevers  Eyes: No visual changes. ENT: No sore throat. Cardiovascular: Denies chest pain. Respiratory: Denies shortness of breath. Gastrointestinal: Abdominal pain, nausea  genitourinary: Negative for dysuria. Musculoskeletal: Negative for back pain. Skin: Negative for rash. Neurological: Negative for headaches, focal weakness or numbness. All other ROS negative ____________________________________________   PHYSICAL EXAM:  VITAL SIGNS: ED Triage Vitals  Enc Vitals Group     BP 03/15/21 0925 (!) 146/89     Pulse Rate 03/15/21 0925 98     Resp 03/15/21 0925 18     Temp 03/15/21 0925 97.7 F (36.5 C)     Temp src --      SpO2 03/15/21 0925 99 %     Weight 03/15/21 0924 128 lb (58.1 kg)     Height 03/15/21 0924 4\' 11"  (1.499 m)     Head Circumference --      Peak Flow --      Pain Score 03/15/21 0924 8     Pain Loc --      Pain Edu? --      Excl. in [place]? --     Constitutional: Alert and oriented. Well appearing and in no acute distress. Eyes: Conjunctivae  are normal. EOMI. Head: Atraumatic. Nose: No congestion/rhinnorhea. Mouth/Throat: Mucous membranes are moist.   Neck: No stridor. Trachea Midline. FROM Cardiovascular: Normal rate, regular rhythm. Grossly normal heart sounds.  Good peripheral circulation. Respiratory: Normal respiratory effort.  No retractions. Lungs CTAB. Gastrointestinal: Soft but tenderness most in the left lower quadrant. No distention. No abdominal bruits.  Musculoskeletal: No lower extremity tenderness nor edema.  No joint effusions. Neurologic:  Normal speech and language. No gross focal neurologic deficits are appreciated.  Skin:  Skin is warm, dry and intact. No rash noted. Psychiatric: Mood and affect are normal. Speech and behavior are normal. GU: Deferred   ____________________________________________   LABS (all labs ordered are listed, but only abnormal results are displayed)  Labs Reviewed  COMPREHENSIVE METABOLIC PANEL - Abnormal; Notable for the following components:      Result Value   Glucose, Bld 134 (*)    All other components within normal limits  CBC - Abnormal; Notable for the following components:   WBC 14.4 (*)    All other components within normal limits  URINALYSIS, COMPLETE (UACMP) WITH MICROSCOPIC - Abnormal; Notable for the following components:   Color, Urine YELLOW (*)    APPearance CLEAR (*)    Specific Gravity, Urine 1.045 (*)    All other components within normal limits  CULTURE, BLOOD (ROUTINE X 2)  CULTURE, BLOOD (ROUTINE X 2)  LIPASE, BLOOD  LACTIC ACID, PLASMA  LACTIC ACID, PLASMA   ____________________________________________    RADIOLOGY   Official radiology report(s): CT ABDOMEN PELVIS W CONTRAST  Result Date: 03/15/2021 CLINICAL DATA:  Left lower quadrant abdominal pain Saturday, nausea, diaphoresis at; history of breast cancer post lumpectomy and radiation therapy, kidney stones, hypertension, diverticulitis EXAM: CT ABDOMEN AND PELVIS  WITH CONTRAST  TECHNIQUE: Multidetector CT imaging of the abdomen and pelvis was performed using the standard protocol following bolus administration of intravenous contrast. Sagittal and coronal MPR images reconstructed from axial data set. CONTRAST:  68mL OMNIPAQUE IOHEXOL 300 MG/ML SOLN IV. No oral contrast. COMPARISON:  02/09/2021 FINDINGS: Lower chest: Lung bases clear Hepatobiliary: Tiny cyst LEFT lobe liver. Gallbladder and liver otherwise normal appearance Pancreas: Normal appearance Spleen: Normal appearance. Small splenule medial to spleen 16 x 13 mm. Adrenals/Urinary Tract: Adrenal glands, kidneys, ureters, and bladder normal appearance Stomach/Bowel: Normal appendix. Duodenal diverticulum. Stomach and small bowel loops unremarkable. Diverticulosis of descending and sigmoid colon with wall thickening and mild pericolic inflammatory changes at sigmoid colon consistent with diverticulitis. Gas and fluid collection identified adjacent to sigmoid colon 3.4 x 2.5 x 3.2 cm likely small diverticular abscess. Additional small fluid collection adjacent to more distal sigmoid colon, 1.6 x 1.5 x 1.7 cm likely second small pericolic abscess. Associated stranding and sigmoid mesocolon. Remainder of colon unremarkable. Vascular/Lymphatic: Atherosclerotic calcifications aorta without aneurysm. Vascular structures patent. No adenopathy. Reproductive: Unremarkable uterus and adnexa Other: Minimal free fluid in pelvis. No free air. Small umbilical hernia containing fat. Musculoskeletal: Prior thoracolumbosacral fusion. Bones demineralized. IMPRESSION: Acute sigmoid diverticulitis with 2 small pericolic abscess collections of 3.4 cm and 1.7 cm. Small umbilical hernia containing fat. Aortic Atherosclerosis (ICD10-I70.0). Electronically Signed   By: Lavonia Dana M.D.   On: 03/15/2021 10:59    ____________________________________________   PROCEDURES  Procedure(s) performed (including Critical Care):  .Critical Care Performed by:  Vanessa Dulce, MD Authorized by: Vanessa [place], MD   Critical care provider statement:    Critical care time (minutes):  35   Critical care was necessary to treat or prevent imminent or life-threatening deterioration of the following conditions:  Sepsis   Critical care was time spent personally by me on the following activities:  Discussions with consultants, evaluation of patient's response to treatment, examination of patient, ordering and performing treatments and interventions, ordering and review of laboratory studies, ordering and review of radiographic studies, pulse oximetry, re-evaluation of patient's condition, obtaining history from patient or surrogate and review of old charts     ____________________________________________   INITIAL IMPRESSION / ASSESSMENT AND PLAN / ED COURSE  Ashla Murph was evaluated in Emergency Department on 03/15/2021 for the symptoms described in the history of present illness. She was evaluated in the context of the global COVID-19 pandemic, which necessitated consideration that the patient might be at risk for infection with the SARS-CoV-2 virus that causes COVID-19. Institutional protocols and algorithms that pertain to the evaluation of patients at risk for COVID-19 are in a state of rapid change based on information released by regulatory bodies including the CDC and federal and state organizations. These policies and algorithms were followed during the patient's care in the ED.    Patient comes in with left lower quadrant pain.  Will get CT scan to evaluate for diverticulitis, abscess, perforation, hernia strangulation or other acute cause of pain.  We will give some IV Dilaudid and IV Zofran to help with symptoms and keep patient on the cardiac monitor   Patient CT scan came back with diverticulitis with small abscesses.  Patient's heart rate was occasionally above 90 and white count was elevated therefore I did initiate sepsis protocol and started  patient on Zosyn to cover for intra-abdominal pathology and gave patient 1 L of fluid.  This time her rates have already come down with the pain  medication so it could just be elevated from that.  However will get lactate and blood cultures just to make sure but I have low suspicion for bacteremia at this time given patient is very well-appearing  12:08 PM On reassessment I do not feel like patient needs pressors  I did discuss the case with surgery team who will consult on and recommend admission to the hospital team         ____________________________________________   FINAL CLINICAL IMPRESSION(S) / ED DIAGNOSES   Final diagnoses:  Sepsis, due to unspecified organism, unspecified whether acute organ dysfunction present [facility])  Diverticulitis  Abscess      MEDICATIONS GIVEN DURING THIS VISIT:  Medications  piperacillin-tazobactam (ZOSYN) IVPB 3.375 g (3.375 g Intravenous New Bag/Given 03/15/21 1201)  HYDROmorphone (DILAUDID) injection 0.5 mg (0.5 mg Intravenous Given 03/15/21 1009)  ondansetron (ZOFRAN) injection 4 mg (4 mg Intravenous Given 03/15/21 1008)  iohexol (OMNIPAQUE) 300 MG/ML solution 75 mL (75 mLs Intravenous Contrast Given 03/15/21 1026)  sodium chloride 0.9 % bolus 1,000 mL (1,000 mLs Intravenous New Bag/Given 03/15/21 1159)     ED Discharge Orders    None       Note:  This document was prepared using Dragon voice recognition software and may include unintentional dictation errors.   Vanessa [place], MD 03/15/21 [phone]

## 2021-03-15 NOTE — H&P (Signed)
History and Physical    Maria Spencer ERX:[id number] DOB: 15-Aug-1946 DOA: 03/15/2021  Referring MD/NP/PA:   PCP: McLean-Scocuzza, Nino Glow, MD   Patient coming from:  The patient is coming from home.  At baseline, pt is independent for most of ADL.       Chief Complaint: Abdominal pain  HPI: Maria Spencer is a 75 y.o. female with medical history significant of hypertension, hyperlipidemia, hypothyroidism, breast cancer (s/p for right lumpectomy, radiation therapy), COVID-19 infection, recurrent UTI, kidney stone, back pain, diverticulitis.  Patient states that she has history of diverticulitis, initially diagnosed in March 2022.  In the past 5 days, she has been having worsening abdominal pain, which is located in the left lower quadrant, moderate, sharp, radiating to the back.  Associated with nausea, but no vomiting, diarrhea.  Denies fever or chills.  Patient does not have chest pain, cough, shortness of breath.  No symptoms of UTI.  She has oral thrush which has been gone for several days.  Patient was a started on Cipro and Flagyl on 03/12/2021 by her doctor without significant help.  ED Course: pt was found to have WBC 14.4, lipase 26, BNP 39, negative urinalysis, negative COVID PCR, lactic acid 0.7, electrolytes renal function okay, temperature normal, blood pressure 139/73, heart rate 98, RR 18, oxygen saturation 98% on room air.  Patient is admitted to [facility] bed as inpatient. Dr. Lucila Maine and PA, Delena Bali of general surgeon were consulted.  CT-abd/pelvis:  Acute sigmoid diverticulitis with 2 small pericolic abscess collections of 3.4 cm and 1.7 cm.  Small umbilical hernia containing fat.  Aortic Atherosclerosis (ICD10-I70.0).  Review of Systems:   General: no fevers, chills, no body weight gain, has poor appetite, has fatigue HEENT: no blurry vision, hearing changes or sore throat. Has oral thrush. Respiratory: no dyspnea, coughing, wheezing CV: no chest pain, no  palpitations GI: Has nausea,  abdominal pain, no diarrhea, vomiting,constipation GU: no dysuria, burning on urination, increased urinary frequency, hematuria  Ext: no leg edema Neuro: no unilateral weakness, numbness, or tingling, no vision change or hearing loss Skin: no rash, no skin tear. MSK: No muscle spasm, no deformity, no limitation of range of movement in spin Heme: No easy bruising.  Travel history: No recent long distant travel.  Allergy:  Allergies  Allergen Reactions  . Sulfa Antibiotics Anaphylaxis, Hives, Itching, Shortness Of Breath and Swelling    Lips & throat swelled  . Nitrofuran Derivatives Swelling    Lip swelling; "eyes get red and puffy"  . Amlodipine Besylate Other (See Comments)    Gas, bloating   Gas, bloating   . Nitrofurantoin Hives and Other (See Comments)    Past Medical History:  Diagnosis Date  . Arthritis    neck, back, left knee;   . Breast cancer ([place]) 2009   right lumpectomy s/p radiation x 1 week 2x per day only   . Chicken pox   . Chronic UTI    established with urology   . COVID-19    ? 12/2020 sob   . Diverticular disease    -osis and -itis   . Esophagitis    egd 05/17/14 see report scanned into chart  . History of kidney stones   . Hormone disorder   . Hyperlipidemia   . Hypertension    controlled well with medication;   . Hypothyroidism   . Osteoporosis   . Personal history of radiation therapy 2009   F/U right breast cancer  . Thyroid disease  hypothyroidism     Past Surgical History:  Procedure Laterality Date  . BONE GRAFT HIP ILIAC CREST     + cage left hip 10/23/17   . BREAST BIOPSY Left 2009   clip,benign  . BREAST BIOPSY Right 2009   +  . BREAST LUMPECTOMY Right 2009   2009 lumpectomy   . CARDIAC CATHETERIZATION     No stents; [facility] doesn't recall facility +37yrs ago  . CATARACT EXTRACTION, BILATERAL Bilateral 10/2019  . COLONOSCOPY WITH PROPOFOL N/A 05/15/2020   Procedure: COLONOSCOPY WITH  PROPOFOL;  Surgeon: Lin Landsman, MD;  Location: [facility] ENDOSCOPY;  Service: Gastroenterology;  Laterality: N/A;  . EYE SURGERY     b/l cataracts sch 10/2019 in [place]  . HERNIA REPAIR    . INSERTION OF MESH N/A 05/29/2018   Procedure: INSERTION OF MESH;  Surgeon: Johnathan Hausen, MD;  Location: [facility] ORS;  Service: General;  Laterality: N/A;  . JOINT REPLACEMENT    . QUADRICEPS TENDON REPAIR Right 12/05/2020   Procedure: OPEN REPAIR OF RIGHT GLUTEUS MINIMUS TENDON;  Surgeon: Corky Mull, MD;  Location: [facility] ORS;  Service: Orthopedics;  Laterality: Right;  . right gluteus medius/minimus repair Dr. Roland Rack 11/2020      Dr. Roland Rack  . SHOULDER SURGERY Left 2008, 2009   x2 rotator cuff surgeries left shoulders  . SPINAL FUSION  07/24/2020  . SPINE SURGERY  10/23/2017   spinal 09/2017 h/o scoliosis [facility] L4-S1 OLIF T10 to ililum fusion  . TONSILLECTOMY     age 73 y.o.   . TOTAL SHOULDER REPLACEMENT Right 2012  . TUBAL LIGATION    . VENTRAL HERNIA REPAIR N/A 05/29/2018   Procedure: LAPAROSCOPIC VENTRAL / [place];  Surgeon: Johnathan Hausen, MD;  Location: [facility] ORS;  Service: General;  Laterality: N/A;    Social History:  reports that she has never smoked. She has never used smokeless tobacco. She reports previous alcohol use of about 7.0 standard drinks of alcohol per week. She reports that she does not use drugs.  Family History:  Family History  Problem Relation Age of Onset  . Arthritis Mother   . Heart disease Mother 59       stent   . Hyperlipidemia Mother   . Hypertension Mother   . Coronary artery disease Mother 61  . Arthritis Father   . Diabetes Father   . Cancer Father        colon  . AAA (abdominal aortic aneurysm) Father   . Arthritis Sister   . Hyperlipidemia Sister   . Hypertension Sister   . Cancer Brother        lung, smoker  . Mental retardation Brother   . Drug abuse Daughter        overdose in 2018   . Arthritis Sister   . Hyperlipidemia Sister   .  Bladder Cancer Neg Hx   . Kidney cancer Neg Hx   . Breast cancer Neg Hx      Prior to Admission medications   Medication Sig Start Date End Date Taking? Authorizing Provider  amoxicillin-clavulanate (AUGMENTIN) 875-125 MG tablet Take 1 tablet by mouth 2 (two) times daily. With food x 10-14 days stop cipro and flagyl Patient not taking: No sig reported 02/14/21   McLean-Scocuzza, Nino Glow, MD  atorvastatin (LIPITOR) 20 MG tablet Take 1 tablet (20 mg total) by mouth every other day. D/c 10 mg pill increased dose Patient not taking: Reported on 02/20/2021 02/12/21   McLean-Scocuzza, Olivia Mackie  N, MD  Calcium Carb-Cholecalciferol ([employer] CALCIUM 600 & VITAMIN D3 PO) Take 1 tablet by mouth daily.    [provider]  cephALEXin (KEFLEX) 250 MG capsule Take 1 capsule (250 mg total) by mouth daily. 01/18/21   Vaillancourt, Aldona Bar, PA-C  ciprofloxacin (CIPRO) 500 MG tablet Take 1 tablet (500 mg total) by mouth 2 (two) times daily for 14 days. 03/12/21 03/26/21  [name], MD  fluticasone [facility]) 50 MCG/ACT nasal spray Place 2 sprays into both nostrils daily. Max b/l nostrils 02/06/21   McLean-Scocuzza, Nino Glow, MD  levothyroxine (SYNTHROID) 88 MCG tablet Take 1 tablet (88 mcg total) by mouth daily before breakfast. 30 minutes 02/06/21   McLean-Scocuzza, Nino Glow, MD  metoprolol succinate (TOPROL-XL) 50 MG 24 hr tablet Take 1 tablet (50 mg total) by mouth daily. Take with or immediately following a meal. 02/06/21 05/07/21  McLean-Scocuzza, Nino Glow, MD  metroNIDAZOLE (FLAGYL) 500 MG tablet Take 1 tablet (500 mg total) by mouth 2 (two) times daily for 14 days. 03/12/21 03/26/21  [name], MD  Multiple Vitamin (MULTI-VITAMINS) TABS Take 1 tablet by mouth daily.     [provider]  Potassium 99 MG TABS Take 1 tablet by mouth daily.    [provider]  telmisartan (MICARDIS) 80 MG tablet Take 1 tablet (80 mg total) by mouth daily. In place of losartan 100. Take 1/2 pill day 3 days and if BP  >130/>80 take 1 pill day 4 and beyond daily 02/12/21   McLean-Scocuzza, Nino Glow, MD  tiZANidine (ZANAFLEX) 2 MG tablet Take 2 mg by mouth at bedtime. 03/08/21   [provider]  zolpidem (AMBIEN) 5 MG tablet Take 1 tablet (5 mg total) by mouth at bedtime as needed for sleep. 02/06/21   McLean-Scocuzza, Nino Glow, MD    Physical Exam: Vitals:   03/15/21 1100 03/15/21 1130 03/15/21 1402 03/15/21 1451  BP: 133/81 139/73 (!) 142/72 (!) 161/80  Pulse: 74 80 78 89  Resp:  18 16 20   Temp:    98.3 F (36.8 C)  TempSrc:    Oral  SpO2: 98% 99% 99% 100%  Weight:    58.1 kg  Height:    4\' 11"  (1.499 m)   General: Not in acute distress HEENT: Has oral thrush       Eyes: PERRL, EOMI, no scleral icterus.       ENT: No discharge from the ears and nose, no pharynx injection, no tonsillar enlargement.        Neck: No JVD, no bruit, no mass felt. Heme: No neck lymph node enlargement. Cardiac: S1/S2, RRR, No murmurs, No gallops or rubs. Respiratory: No rales, wheezing, rhonchi or rubs. GI: Soft, nondistended, has tenderness left lower quadrant, no rebound pain, no organomegaly, BS present. GU: No hematuria Ext: No pitting leg edema bilaterally. 1+DP/PT pulse bilaterally. Musculoskeletal: No joint deformities, No joint redness or warmth, no limitation of ROM in spin. Skin: No rashes.  Neuro: Alert, oriented X3, cranial nerves II-XII grossly intact, moves all extremities normally. Psych: Patient is not psychotic, no suicidal or hemocidal ideation.  Labs on Admission: I have personally reviewed following labs and imaging studies  CBC: Recent Labs  Lab 03/15/21 0929  WBC 14.4*  HGB 13.5  HCT 41.6  MCV 82.5  PLT 563   Basic Metabolic Panel: Recent Labs  Lab 03/15/21 0929  NA 137  K 3.5  CL 103  CO2 24  GLUCOSE 134*  BUN 14  CREATININE 0.54  CALCIUM  8.9   GFR: Estimated Creatinine Clearance: 47.9 mL/min (by C-G formula based on SCr of 0.54 mg/dL). Liver Function Tests: Recent  Labs  Lab 03/15/21 0929  AST 22  ALT 15  ALKPHOS 84  BILITOT 0.5  PROT 7.9  ALBUMIN 3.7   Recent Labs  Lab 03/15/21 0929  LIPASE 26   No results for input(s): AMMONIA in the last 168 hours. Coagulation Profile: Recent Labs  Lab 03/15/21 1511  INR 1.0   Cardiac Enzymes: No results for input(s): CKTOTAL, CKMB, CKMBINDEX, TROPONINI in the last 168 hours. BNP (last 3 results) No results for input(s): PROBNP in the last 8760 hours. HbA1C: No results for input(s): HGBA1C in the last 72 hours. CBG: No results for input(s): GLUCAP in the last 168 hours. Lipid Profile: No results for input(s): CHOL, HDL, LDLCALC, TRIG, CHOLHDL, LDLDIRECT in the last 72 hours. Thyroid Function Tests: No results for input(s): TSH, T4TOTAL, FREET4, T3FREE, THYROIDAB in the last 72 hours. Anemia Panel: No results for input(s): VITAMINB12, FOLATE, FERRITIN, TIBC, IRON, RETICCTPCT in the last 72 hours. Urine analysis:    Component Value Date/Time   COLORURINE YELLOW (A) 03/15/2021 1109   APPEARANCEUR CLEAR (A) 03/15/2021 1109   APPEARANCEUR Clear 01/18/2021 1115   LABSPEC 1.045 (H) 03/15/2021 1109   PHURINE 6.0 03/15/2021 1109   GLUCOSEU NEGATIVE 03/15/2021 1109   GLUCOSEU NEGATIVE 09/22/2018 1416   HGBUR NEGATIVE 03/15/2021 1109   BILIRUBINUR NEGATIVE 03/15/2021 1109   BILIRUBINUR Negative 01/18/2021 1115   KETONESUR NEGATIVE 03/15/2021 1109   PROTEINUR NEGATIVE 03/15/2021 1109   UROBILINOGEN 1.0 01/24/2020 1107   UROBILINOGEN 0.2 09/22/2018 1416   NITRITE NEGATIVE 03/15/2021 1109   LEUKOCYTESUR NEGATIVE 03/15/2021 1109   Sepsis Labs: @LABRCNTIP (procalcitonin:4,lacticidven:4) ) Recent Results (from the past 240 hour(s))  Resp Panel by RT-PCR (Flu A&B, Covid) Nasopharyngeal Swab     Status: None   Collection Time: 03/15/21 12:54 PM   Specimen: Nasopharyngeal Swab; Nasopharyngeal(NP) swabs in vial transport medium  Result Value Ref Range Status   SARS Coronavirus 2 by RT PCR NEGATIVE  NEGATIVE Final    Comment: (NOTE) SARS-CoV-2 target nucleic acids are NOT DETECTED.  The SARS-CoV-2 RNA is generally detectable in upper respiratory specimens during the acute phase of infection. The lowest concentration of SARS-CoV-2 viral copies this assay can detect is 138 copies/mL. A negative result does not preclude SARS-Cov-2 infection and should not be used as the sole basis for treatment or other patient management decisions. A negative result may occur with  improper specimen collection/handling, submission of specimen other than nasopharyngeal swab, presence of viral mutation(s) within the areas targeted by this assay, and inadequate number of viral copies(<138 copies/mL). A negative result must be combined with clinical observations, patient history, and epidemiological information. The expected result is Negative.  Fact Sheet for Patients:  EntrepreneurPulse.com.au  Fact Sheet for Healthcare Providers:  IncredibleEmployment.be  This test is no t yet approved or cleared by the [place] FDA and  has been authorized for detection and/or diagnosis of SARS-CoV-2 by FDA under an Emergency Use Authorization (EUA). This EUA will remain  in effect (meaning this test can be used) for the duration of the COVID-19 declaration under Section 564(b)(1) of the Act, 21 U.S.C.section 360bbb-3(b)(1), unless the authorization is terminated  or revoked sooner.       Influenza A by PCR NEGATIVE NEGATIVE Final   Influenza B by PCR NEGATIVE NEGATIVE Final    Comment: (NOTE) The Xpert Xpress SARS-CoV-2/FLU/RSV plus assay is intended as  an aid in the diagnosis of influenza from Nasopharyngeal swab specimens and should not be used as a sole basis for treatment. Nasal washings and aspirates are unacceptable for Xpert Xpress SARS-CoV-2/FLU/RSV testing.  Fact Sheet for Patients: EntrepreneurPulse.com.au  Fact Sheet for Healthcare  Providers: IncredibleEmployment.be  This test is not yet approved or cleared by the [place] FDA and has been authorized for detection and/or diagnosis of SARS-CoV-2 by FDA under an Emergency Use Authorization (EUA). This EUA will remain in effect (meaning this test can be used) for the duration of the COVID-19 declaration under Section 564(b)(1) of the Act, 21 U.S.C. section 360bbb-3(b)(1), unless the authorization is terminated or revoked.  Performed at [facility], [address] [place], [place] [address]      Radiological Exams on Admission: CT ABDOMEN PELVIS W CONTRAST  Result Date: 03/15/2021 CLINICAL DATA:  Left lower quadrant abdominal pain Saturday, nausea, diaphoresis at; history of breast cancer post lumpectomy and radiation therapy, kidney stones, hypertension, diverticulitis EXAM: CT ABDOMEN AND PELVIS WITH CONTRAST TECHNIQUE: Multidetector CT imaging of the abdomen and pelvis was performed using the standard protocol following bolus administration of intravenous contrast. Sagittal and coronal MPR images reconstructed from axial data set. CONTRAST:  65mL OMNIPAQUE IOHEXOL 300 MG/ML SOLN IV. No oral contrast. COMPARISON:  02/09/2021 FINDINGS: Lower chest: Lung bases clear Hepatobiliary: Tiny cyst LEFT lobe liver. Gallbladder and liver otherwise normal appearance Pancreas: Normal appearance Spleen: Normal appearance. Small splenule medial to spleen 16 x 13 mm. Adrenals/Urinary Tract: Adrenal glands, kidneys, ureters, and bladder normal appearance Stomach/Bowel: Normal appendix. Duodenal diverticulum. Stomach and small bowel loops unremarkable. Diverticulosis of descending and sigmoid colon with wall thickening and mild pericolic inflammatory changes at sigmoid colon consistent with diverticulitis. Gas and fluid collection identified adjacent to sigmoid colon 3.4 x 2.5 x 3.2 cm likely small diverticular abscess. Additional small fluid collection  adjacent to more distal sigmoid colon, 1.6 x 1.5 x 1.7 cm likely second small pericolic abscess. Associated stranding and sigmoid mesocolon. Remainder of colon unremarkable. Vascular/Lymphatic: Atherosclerotic calcifications aorta without aneurysm. Vascular structures patent. No adenopathy. Reproductive: Unremarkable uterus and adnexa Other: Minimal free fluid in pelvis. No free air. Small umbilical hernia containing fat. Musculoskeletal: Prior thoracolumbosacral fusion. Bones demineralized. IMPRESSION: Acute sigmoid diverticulitis with 2 small pericolic abscess collections of 3.4 cm and 1.7 cm. Small umbilical hernia containing fat. Aortic Atherosclerosis (ICD10-I70.0). Electronically Signed   By: Lavonia Dana M.D.   On: 03/15/2021 10:59     EKG:  Not done in ED, will get one.   Assessment/Plan Principal Problem:   Diverticulitis of large intestine with abscess Active Problems:   Hypothyroidism   Hyperlipidemia   Essential hypertension   Sepsis ([place])   Oral thrush   Sepsis due to diverticulitis of large intestine with abscess: Patient admits criteria for sepsis with leukocytosis with WBC 14.4, tachycardia with heart rate 98.  Lactic acid is normal.  Currently hemodynamically stable. Dr. Lucila Maine and PA, Delena Bali of general surgeon  -will admit to telemetry bed as inpatient -IV Zosyn -As needed morphine, Percocet for pain, Zofran for nausea --will get Procalcitonin  -IVF: 1L of NS bolus in ED, followed by 75 cc/h  - NPO  Hypothyroidism -Synthroid  Hyperlipidemia -Lipitor  Essential hypertension -Metoprolol -IV hydralazine as needed  Oral thrush -Nystatin    DVT ppx: SQ Heparin    Code Status: Partial code (I discussed with the patient in the presence of her brother-in-law, and explained the meaning of CODE STATUS, patient wants  to be partial code, OK for CPR, but no intubation).  Family Communication: Yes, patient's brother-in-law   at bed side Disposition Plan:   Anticipate discharge back to previous environment Consults called: Dr. Lucila Maine and PA, Delena Bali of general surgeon Admission status and Level of care: Med-Surg:   as inpt        Status is: Inpatient  Remains inpatient appropriate because:Inpatient level of care appropriate due to severity of illness   Dispo: The patient is from: Home              Anticipated d/c is to: Home              Patient currently is not medically stable to d/c.   Difficult to place patient No            Date of Service 03/15/2021    [facility]   If 7PM-7AM, please contact night-coverage [email or web address] 03/15/2021, 3:57 PM

## 2021-03-15 NOTE — Telephone Encounter (Signed)
Patients called and said her pain was so bad that she went to the ER.

## 2021-03-15 NOTE — Telephone Encounter (Signed)
Patient called to let Dr Vicente Males know that her back and stomach pain was bad enough that she is heading over to ER.

## 2021-03-15 NOTE — ED Notes (Signed)
Patient transported to CT 

## 2021-03-15 NOTE — ED Notes (Signed)
Informed RN bed assigned [id number]

## 2021-03-15 NOTE — Consult Note (Signed)
Pharmacy Antibiotic Note  Rafaela Dinius is a 75 y.o. female with history of diverticular disease admitted on 03/15/2021 with diverticulitis refractory to outpatient antibiotics (Cipro + Flagyl). Imaging concerning for complicated diverticulitis with small abscesses. General surgery consulted, no plan for surgical intervention at this time. Pharmacy has been consulted for Zosyn dosing.  Plan: Zosyn 3.375g IV q8h (4 hour infusion).  Height: 4\' 11"  (149.9 cm) Weight: 58.1 kg (128 lb) IBW/kg (Calculated) : 43.2  Temp (24hrs), Avg:97.7 F (36.5 C), Min:97.7 F (36.5 C), Max:97.7 F (36.5 C)  Recent Labs  Lab 03/15/21 0929 03/15/21 1154  WBC 14.4*  --   CREATININE 0.54  --   LATICACIDVEN  --  0.7    Estimated Creatinine Clearance: 47.9 mL/min (by C-G formula based on SCr of 0.54 mg/dL).    Allergies  Allergen Reactions  . Sulfa Antibiotics Anaphylaxis, Hives, Itching, Shortness Of Breath and Swelling    Lips & throat swelled  . Nitrofuran Derivatives Swelling    Lip swelling; "eyes get red and puffy"  . Amlodipine Besylate Other (See Comments)    Gas, bloating   Gas, bloating   . Nitrofurantoin Hives and Other (See Comments)    Antimicrobials this admission: Zosyn 4/21 >>   Dose adjustments this admission: N/A  Microbiology results: 4/21 BCx: pending  Thank you for allowing pharmacy to be a part of this patient's care.  Benita Gutter 03/15/2021 12:41 PM

## 2021-03-15 NOTE — Sepsis Progress Note (Signed)
[facility] is following this sepsis 

## 2021-03-16 LAB — CBC
HCT: 33.6 % — ABNORMAL LOW (ref 36.0–46.0)
Hemoglobin: 10.7 g/dL — ABNORMAL LOW (ref 12.0–15.0)
MCH: 26.9 pg (ref 26.0–34.0)
MCHC: 31.8 g/dL (ref 30.0–36.0)
MCV: 84.4 fL (ref 80.0–100.0)
Platelets: 236 10*3/uL (ref 150–400)
RBC: 3.98 MIL/uL (ref 3.87–5.11)
RDW: 15.1 % (ref 11.5–15.5)
WBC: 8.2 10*3/uL (ref 4.0–10.5)
nRBC: 0 % (ref 0.0–0.2)

## 2021-03-16 LAB — GLUCOSE, CAPILLARY: Glucose-Capillary: 79 mg/dL (ref 70–99)

## 2021-03-16 LAB — BASIC METABOLIC PANEL
Anion gap: 8 (ref 5–15)
BUN: 9 mg/dL (ref 8–23)
CO2: 25 mmol/L (ref 22–32)
Calcium: 8.1 mg/dL — ABNORMAL LOW (ref 8.9–10.3)
Chloride: 106 mmol/L (ref 98–111)
Creatinine, Ser: 0.58 mg/dL (ref 0.44–1.00)
GFR, Estimated: >60 mL/min (ref >60)
Glucose, Bld: 86 mg/dL (ref 70–99)
Potassium: 4 mmol/L (ref 3.5–5.1)
Sodium: 139 mmol/L (ref 135–145)

## 2021-03-16 MED ORDER — BOOST / RESOURCE BREEZE PO LIQD CUSTOM
1.0000 | Freq: Three times a day (TID) | ORAL | Status: DC
  Administered 2021-03-16 – 2021-03-18 (×4): 1 via ORAL

## 2021-03-16 MED ORDER — IRBESARTAN 150 MG PO TABS
150.0000 mg | ORAL_TABLET | Freq: Every day | ORAL | Status: DC
  Administered 2021-03-16 – 2021-03-20 (×5): 150 mg via ORAL
  Filled 2021-03-16 (×5): qty 1

## 2021-03-16 NOTE — Progress Notes (Signed)
1        [facility] [place] NAME: Maria Spencer    MR#:  [id number]  DATE OF BIRTH:  01-04-1946  SUBJECTIVE:  CHIEF COMPLAINT:   Chief Complaint  Patient presents with  . Abdominal Pain  Feeling much better, sitting in the chair.  Pain reported as 2/10.  Denies any vomiting feels mild nausea which she claims is from antibiotic REVIEW OF SYSTEMS:  Review of Systems  Constitutional: Negative for diaphoresis, fever, malaise/fatigue and weight loss.  HENT: Negative for ear discharge, ear pain, hearing loss, nosebleeds, sore throat and tinnitus.   Eyes: Negative for blurred vision and pain.  Respiratory: Negative for cough, hemoptysis, shortness of breath and wheezing.   Cardiovascular: Negative for chest pain, palpitations, orthopnea and leg swelling.  Gastrointestinal: Positive for abdominal pain and nausea. Negative for blood in stool, constipation, diarrhea, heartburn and vomiting.  Genitourinary: Negative for dysuria, frequency and urgency.  Musculoskeletal: Negative for back pain and myalgias.  Skin: Negative for itching and rash.  Neurological: Negative for dizziness, tingling, tremors, focal weakness, seizures, weakness and headaches.  Psychiatric/Behavioral: Negative for depression. The patient is not nervous/anxious.    DRUG ALLERGIES:   Allergies  Allergen Reactions  . Sulfa Antibiotics Anaphylaxis, Hives, Itching, Shortness Of Breath and Swelling    Lips & throat swelled  . Nitrofuran Derivatives Swelling    Lip swelling; "eyes get red and puffy"  . Amlodipine Besylate Other (See Comments)    Gas, bloating   Gas, bloating   . Nitrofurantoin Hives and Other (See Comments)   VITALS:  Blood pressure (!) 159/70, pulse 70, temperature 98 F (36.7 C), temperature source Oral, resp. rate 16, height 4\' 11"  (1.499 m), weight 58.1 kg, SpO2 100 %. PHYSICAL EXAMINATION:  Physical Exam  75 year old female sitting in the chair comfortably without  any acute distress Lungs clear to auscultation bilaterally no wheezing rales rhonchi or crepitation Cardiovascular S1-S2 normal no murmur rales or gallop Abdomen soft, left lower quadrant tenderness present.  No organomegaly or masses, no guarding or rigidity Neuro alert and oriented, nonfocal Skin skin no rash or lesion LABORATORY PANEL:  Female CBC Recent Labs  Lab 03/16/21 0434  WBC 8.2  HGB 10.7*  HCT 33.6*  PLT 236   ------------------------------------------------------------------------------------------------------------------ Chemistries  Recent Labs  Lab 03/15/21 0929 03/16/21 0434  NA 137 139  K 3.5 4.0  CL 103 106  CO2 24 25  GLUCOSE 134* 86  BUN 14 9  CREATININE 0.54 0.58  CALCIUM 8.9 8.1*  AST 22  --   ALT 15  --   ALKPHOS 84  --   BILITOT 0.5  --    RADIOLOGY:  No results found. ASSESSMENT AND PLAN:  75 y.o. female with medical history significant of hypertension, hyperlipidemia, hypothyroidism, breast cancer (s/p for right lumpectomy, radiation therapy), COVID-19 infection, recurrent UTI, kidney stone, back pain, recurrent diverticulitis is admitted for.  Sepsis due to diverticulitis of large intestine with abscess:  Present on admission.  Now resolved. Currently hemodynamically stable. Dr. Lucila Maine and PA, Delena Bali of general surgeon following -Conservative management for now -IV Zosyn -As needed morphine, Percocet for pain, Zofran for nausea -- Clear liquid diets for next 48 hours and recheck CT on Monday per surgery  Hypothyroidism -Synthroid  Hyperlipidemia -Lipitor  Essential hypertension -Metoprolol -IV hydralazine as needed  Oral thrush -Nystatin  Body mass index is 25.85 kg/m.  Net IO Since Admission: 1,050 mL [03/16/21 1422]  Status is: Inpatient  Remains inpatient appropriate because:Ongoing active pain requiring inpatient pain management, Ongoing diagnostic testing needed not appropriate for outpatient work up  and IV treatments appropriate due to intensity of illness or inability to take PO   Dispo: The patient is from: Home              Anticipated d/c is to: Home              Patient currently is not medically stable to d/c.   Difficult to place patient No  DVT prophylaxis:       heparin injection 5,000 Units Start: 03/15/21 2200     Family Communication:  "discussed with patient")   All the records are reviewed and case discussed with Care Management/Social Worker. Management plans discussed with the patient, nursing and they are in agreement.  CODE STATUS: Partial Code Level of care: Med-Surg  TOTAL TIME TAKING CARE OF THIS PATIENT: 35 minutes.   More than 50% of the time was spent in counseling/coordination of care: YES  POSSIBLE D/C IN 3-4 DAYS, DEPENDING ON CLINICAL CONDITION.   Max Sane M.D on 03/16/2021 at 2:22 PM  [facility]   CC: Primary care physician; McLean-Scocuzza, Nino Glow, MD  Note: This dictation was prepared with Dragon dictation along with smaller phrase technology. Any transcriptional errors that result from this process are unintentional.

## 2021-03-16 NOTE — Plan of Care (Signed)
Continuing with plan of care. 

## 2021-03-16 NOTE — Progress Notes (Addendum)
[facility] SURGICAL PROGRESS NOTE (cpt [phone])  Hospital Day(s): 1.   Interval History: Patient seen and examined, no acute events or new complaints overnight. Patient reports she is overall feeling better from presentation. Still with pain in the LLQ which is exacerbated by attempting to have a BM. No fever, chills, emesis. Her leukocytosis has now resolved with WBC at 8.2K. Her BMP is reassuring. She continues on Zosyn. No other complaints.   Review of Systems:  Constitutional: denies fever, chills  HEENT: denies cough or congestion  Respiratory: denies any shortness of breath  Cardiovascular: denies chest pain or palpitations  Gastrointestinal: + abdominal pain, denied N/V, or diarrhea/and bowel function as per interval history Genitourinary: denies burning with urination or urinary frequency  Vital signs in last 24 hours: [min-max] current  Temp:  [97.7 F (36.5 C)-98.6 F (37 C)] 98 F (36.7 C) (04/22 0326) Pulse Rate:  [73-98] 76 (04/22 0326) Resp:  [16-20] 20 (04/22 0326) BP: (133-175)/(58-89) 153/82 (04/22 0326) SpO2:  [98 %-100 %] 99 % (04/22 0326) Weight:  [58.1 kg] 58.1 kg (04/21 1451)     Height: 4\' 11"  (149.9 cm) Weight: 58.1 kg BMI (Calculated): 25.84   Intake/Output last 2 shifts:  04/21 0701 - 04/22 0700 In: 1050 [IV Piggyback:1050] Out: 0    Physical Exam:  Constitutional: alert, cooperative and no distress  HENT: normocephalic without obvious abnormality  Eyes: PERRL, EOM's grossly intact and symmetric  Respiratory: breathing non-labored at rest  Cardiovascular: regular rate and sinus rhythm  Gastrointestinal: Soft, she continues to have tenderness in the LLQ and suprapubic region but I do feel this is improved some from yesterday, and non-distended, no peritonitis  Musculoskeletal: no edema or wounds, motor and sensation grossly intact, NT    Labs:  CBC Latest Ref Rng & Units 03/16/2021 03/15/2021 02/06/2021  WBC 4.0 - 10.5 K/uL 8.2 14.4(H)  7.2  Hemoglobin 12.0 - 15.0 g/dL 10.7(L) 13.5 12.8  Hematocrit 36.0 - 46.0 % 33.6(L) 41.6 39.4  Platelets 150 - 400 K/uL 236 288 288.0   CMP Latest Ref Rng & Units 03/16/2021 03/15/2021 02/06/2021  Glucose 70 - 99 mg/dL 86 134(H) 105(H)  BUN 8 - 23 mg/dL 9 14 24(H)  Creatinine 0.44 - 1.00 mg/dL 0.58 0.54 0.54  Sodium 135 - 145 mmol/L 139 137 141  Potassium 3.5 - 5.1 mmol/L 4.0 3.5 3.9  Chloride 98 - 111 mmol/L 106 103 104  CO2 22 - 32 mmol/L 25 24 28   Calcium 8.9 - 10.3 mg/dL 8.1(L) 8.9 9.6  Total Protein 6.5 - 8.1 g/dL - 7.9 6.9  Total Bilirubin 0.3 - 1.2 mg/dL - 0.5 0.4  Alkaline Phos 38 - 126 U/L - 84 80  AST 15 - 41 U/L - 22 17  ALT 0 - 44 U/L - 15 12    Imaging studies: No new pertinent imaging studies   Assessment/Plan: (ICD-10's: K36.20) 75 y.o. female with resolution in leukocytosis and improvement in abdominal pain admitted with acute diverticulitis with abscess, complicated by multiple pertinent comorbidities   - Continue NPO; okay for sips of liquids +/- Boost; will await improvement in pain before initiating diet    - No emergent surgical intervention; she understands that should she clinically deteriorate or fail conservative measures we would need to consider more urgent intervention including likely temporizing colostomy  - May benefit from CT in 72 hours pending clinical condition.              - Continue IVF resuscitation             -  Agree with IV Abx (Zosyn)             - Monitor abdominal examination; on-going bowel function             - Pain control prn; antiemetics prn             - Monitor leukocytosis; resolved             - Further management per primary service; we will of course follow    All of the above findings and recommendations were discussed with the patient, and the medical team, and all of patient's questions were answered to her expressed satisfaction.  -- Edison Simon, PA-C [facility] 03/16/2021, 8:10  AM [phone] M-F: 7am - 4pm

## 2021-03-17 LAB — CBC
HCT: 34.8 % — ABNORMAL LOW (ref 36.0–46.0)
Hemoglobin: 11.2 g/dL — ABNORMAL LOW (ref 12.0–15.0)
MCH: 27.1 pg (ref 26.0–34.0)
MCHC: 32.2 g/dL (ref 30.0–36.0)
MCV: 84.1 fL (ref 80.0–100.0)
Platelets: 249 10*3/uL (ref 150–400)
RBC: 4.14 MIL/uL (ref 3.87–5.11)
RDW: 14.7 % (ref 11.5–15.5)
WBC: 6.7 10*3/uL (ref 4.0–10.5)
nRBC: 0 % (ref 0.0–0.2)

## 2021-03-17 LAB — BASIC METABOLIC PANEL
Anion gap: 7 (ref 5–15)
BUN: 7 mg/dL — ABNORMAL LOW (ref 8–23)
CO2: 27 mmol/L (ref 22–32)
Calcium: 8.6 mg/dL — ABNORMAL LOW (ref 8.9–10.3)
Chloride: 104 mmol/L (ref 98–111)
Creatinine, Ser: 0.56 mg/dL (ref 0.44–1.00)
GFR, Estimated: >60 mL/min (ref >60)
Glucose, Bld: 108 mg/dL — ABNORMAL HIGH (ref 70–99)
Potassium: 3.9 mmol/L (ref 3.5–5.1)
Sodium: 138 mmol/L (ref 135–145)

## 2021-03-17 LAB — GLUCOSE, CAPILLARY: Glucose-Capillary: 118 mg/dL — ABNORMAL HIGH (ref 70–99)

## 2021-03-17 MED ORDER — SODIUM CHLORIDE 0.9 % IV SOLN
INTRAVENOUS | Status: DC | PRN
  Administered 2021-03-17 – 2021-03-18 (×2): 250 mL via INTRAVENOUS

## 2021-03-17 NOTE — Plan of Care (Signed)
Continuing with plan of care. 

## 2021-03-17 NOTE — Progress Notes (Signed)
03/17/2021  Subjective: No acute events.  Patient reports that last night was initially rough but then got senna and she had a BM and felt better after that.  Reports that her pain has improved.  Her WBC today remains normal.    Vital signs: Temp:  [98.4 F (36.9 C)-98.5 F (36.9 C)] 98.5 F (36.9 C) (04/23 0436) Pulse Rate:  [62-73] 73 (04/23 0436) Resp:  [16-20] 16 (04/23 0436) BP: (144-159)/(75-76) 144/76 (04/23 0436) SpO2:  [98 %-100 %] 98 % (04/23 0436)   Intake/Output: 04/22 0701 - 04/23 0700 In: 893.9 [I.V.:693.9; IV Piggyback:200] Out: -  Last BM Date: 03/15/21 (per patient)  Physical Exam: Constitutional:  No acute distress Abdomen:  Soft, non-distended, with tenderness to palpation in the LLQ.  No peritonitis.  Labs:  Recent Labs    03/16/21 0434 03/17/21 0428  WBC 8.2 6.7  HGB 10.7* 11.2*  HCT 33.6* 34.8*  PLT 236 249   Recent Labs    03/16/21 0434 03/17/21 0428  NA 139 138  K 4.0 3.9  CL 106 104  CO2 25 27  GLUCOSE 86 108*  BUN 9 7*  CREATININE 0.58 0.56  CALCIUM 8.1* 8.6*   Recent Labs    03/15/21 1511  LABPROT 13.5  INR 1.0    Imaging: No results found.  Assessment/Plan: This is a 75 y.o. female with acute diverticulitis with two small abscesses  --Discussed with the patient that give the small abscess and the fact that they're not amenable for drainage, we're trying to move slowly to that we can make sure everything is healing appropriately.  Will continue clear liquid diet today.  If tomorrow she's feeling much better without pain, then we can advance to full liquids.  Otherwise would continue clears and repeat CT scan on Monday to evaluate the two areas of abscess.   Melvyn Neth, [facility]

## 2021-03-17 NOTE — Progress Notes (Signed)
1        [place] [facility] [place] NAME: Maria Spencer    MR#:  [id number]  DATE OF BIRTH:  Apr 05, 1946  SUBJECTIVE:  CHIEF COMPLAINT:   Chief Complaint  Patient presents with  . Abdominal Pain  Feeling much better, not having any pain.  Tolerating clear liquid diet.  Reports some nausea from antibiotic REVIEW OF SYSTEMS:  Review of Systems  Constitutional: Negative for diaphoresis, fever, malaise/fatigue and weight loss.  HENT: Negative for ear discharge, ear pain, hearing loss, nosebleeds, sore throat and tinnitus.   Eyes: Negative for blurred vision and pain.  Respiratory: Negative for cough, hemoptysis, shortness of breath and wheezing.   Cardiovascular: Negative for chest pain, palpitations, orthopnea and leg swelling.  Gastrointestinal: Positive for nausea. Negative for blood in stool, constipation, diarrhea, heartburn and vomiting.  Genitourinary: Negative for dysuria, frequency and urgency.  Musculoskeletal: Negative for back pain and myalgias.  Skin: Negative for itching and rash.  Neurological: Negative for dizziness, tingling, tremors, focal weakness, seizures, weakness and headaches.  Psychiatric/Behavioral: Negative for depression. The patient is not nervous/anxious.    DRUG ALLERGIES:   Allergies  Allergen Reactions  . Sulfa Antibiotics Anaphylaxis, Hives, Itching, Shortness Of Breath and Swelling    Lips & throat swelled  . Nitrofuran Derivatives Swelling    Lip swelling; "eyes get red and puffy"  . Amlodipine Besylate Other (See Comments)    Gas, bloating   Gas, bloating   . Nitrofurantoin Hives and Other (See Comments)   VITALS:  Blood pressure (!) 144/76, pulse 73, temperature 98.5 F (36.9 C), temperature source Oral, resp. rate 16, height 4\' 11"  (1.499 m), weight 58.1 kg, SpO2 98 %. PHYSICAL EXAMINATION:  Physical Exam  75 year old female sitting in the chair comfortably without any acute distress Lungs clear to auscultation  bilaterally no wheezing rales rhonchi or crepitation Cardiovascular S1-S2 normal no murmur rales or gallop Abdomen soft, left lower quadrant tenderness present.  No organomegaly or masses, no guarding or rigidity Neuro alert and oriented, nonfocal Skin skin no rash or lesion LABORATORY PANEL:  Female CBC Recent Labs  Lab 03/17/21 0428  WBC 6.7  HGB 11.2*  HCT 34.8*  PLT 249   ------------------------------------------------------------------------------------------------------------------ Chemistries  Recent Labs  Lab 03/15/21 0929 03/16/21 0434 03/17/21 0428  NA 137   < > 138  K 3.5   < > 3.9  CL 103   < > 104  CO2 24   < > 27  GLUCOSE 134*   < > 108*  BUN 14   < > 7*  CREATININE 0.54   < > 0.56  CALCIUM 8.9   < > 8.6*  AST 22  --   --   ALT 15  --   --   ALKPHOS 84  --   --   BILITOT 0.5  --   --    < > = values in this interval not displayed.   RADIOLOGY:  No results found. ASSESSMENT AND PLAN:  75 y.o. female with medical history significant of hypertension, hyperlipidemia, hypothyroidism, breast cancer (s/p for right lumpectomy, radiation therapy), COVID-19 infection, recurrent UTI, kidney stone, back pain, recurrent diverticulitis is admitted for.  Sepsis due to diverticulitis of large intestine with abscess:  Present on admission.  Now resolved. Currently hemodynamically stable. -Conservative management for now -IV Zosyn -As needed morphine, Percocet for pain, Zofran for nausea -- Clear liquid diets for next 24 hours and advance to full liquid as tolerated.  Recheck CT on Monday per surgery  Hypothyroidism -Synthroid  Hyperlipidemia -Lipitor  Essential hypertension -Metoprolol -IV hydralazine as needed  Oral thrush -Nystatin  Body mass index is 25.85 kg/m.  Net IO Since Admission: 2,483.88 mL [03/17/21 1421]      Status is: Inpatient  Remains inpatient appropriate because:Ongoing active pain requiring inpatient pain management, Ongoing  diagnostic testing needed not appropriate for outpatient work up and IV treatments appropriate due to intensity of illness or inability to take PO   Dispo: The patient is from: Home              Anticipated d/c is to: Home              Patient currently is not medically stable to d/c.   Difficult to place patient No  DVT prophylaxis:       heparin injection 5,000 Units Start: 03/15/21 2200     Family Communication:  "discussed with patient")   All the records are reviewed and case discussed with Care Management/Social Worker. Management plans discussed with the patient, nursing and they are in agreement.  CODE STATUS: Partial Code Level of care: Med-Surg  TOTAL TIME TAKING CARE OF THIS PATIENT: 35 minutes.   More than 50% of the time was spent in counseling/coordination of care: YES  POSSIBLE D/C IN 2-3 DAYS, DEPENDING ON CLINICAL CONDITION.   Max Sane M.D on 03/17/2021 at 2:21 PM  [facility]   CC: Primary care physician; McLean-Scocuzza, Nino Glow, MD  Note: This dictation was prepared with Dragon dictation along with smaller phrase technology. Any transcriptional errors that result from this process are unintentional.

## 2021-03-18 LAB — GLUCOSE, CAPILLARY: Glucose-Capillary: 106 mg/dL — ABNORMAL HIGH (ref 70–99)

## 2021-03-18 LAB — CBC
HCT: 34.6 % — ABNORMAL LOW (ref 36.0–46.0)
Hemoglobin: 11.6 g/dL — ABNORMAL LOW (ref 12.0–15.0)
MCH: 26.7 pg (ref 26.0–34.0)
MCHC: 33.5 g/dL (ref 30.0–36.0)
MCV: 79.5 fL — ABNORMAL LOW (ref 80.0–100.0)
Platelets: 272 10*3/uL (ref 150–400)
RBC: 4.35 MIL/uL (ref 3.87–5.11)
RDW: 14.8 % (ref 11.5–15.5)
WBC: 5.6 10*3/uL (ref 4.0–10.5)
nRBC: 0 % (ref 0.0–0.2)

## 2021-03-18 MED ORDER — LOPERAMIDE HCL 2 MG PO CAPS
2.0000 mg | ORAL_CAPSULE | Freq: Four times a day (QID) | ORAL | Status: DC | PRN
Start: 2021-03-18 — End: 2021-03-20
  Administered 2021-03-18 – 2021-03-19 (×3): 2 mg via ORAL
  Filled 2021-03-18 (×3): qty 1

## 2021-03-18 NOTE — Plan of Care (Signed)
  Problem: Clinical Measurements: Goal: Ability to maintain clinical measurements within normal limits will improve Outcome: Progressing Goal: Will remain free from infection Outcome: Progressing Goal: Diagnostic test results will improve Outcome: Progressing Goal: Respiratory complications will improve Outcome: Progressing Goal: Cardiovascular complication will be avoided Outcome: Progressing   Problem: Pain Managment: Goal: General experience of comfort will improve Outcome: Progressing   Patient is involved in and agrees with the plan of care. V/S stable. Denies any pain at this time. Complained of feeling gassy. No BM noted this shift.

## 2021-03-18 NOTE — Plan of Care (Signed)
Continuing with plan of care. 

## 2021-03-18 NOTE — Progress Notes (Signed)
03/18/2021  Subjective: No acute events overnight.  Patient had bowel movement last night and felt some gas pains initially with it but afterwards felt much better.  Today she reports that there is less discomfort in the left lower quadrant but for some reason a little bit more discomfort in the right lower quadrant.  She thinks it could potentially be related to the DVT prophylaxis injection she has been getting.  White blood cell count remains normal and she has been tolerating clear liquids with no issues.  Vital signs: Temp:  [97.8 F (36.6 C)-98.2 F (36.8 C)] 98 F (36.7 C) (04/24 0805) Pulse Rate:  [61-72] 65 (04/24 0805) Resp:  [18-20] 18 (04/24 0805) BP: (122-140)/(52-79) 127/61 (04/24 0805) SpO2:  [97 %-99 %] 97 % (04/24 0805)   Intake/Output: 04/23 0701 - 04/24 0700 In: 1746.3 [P.O.:1020; I.V.:632.2; IV Piggyback:94] Out: -  Last BM Date: 03/15/21 (per patient)  Physical Exam: Constitutional: No acute distress Abdomen: Soft, nondistended, with mild discomfort only in the left lower quadrant and some mild tenderness in the right lower quadrant.  This is in the vicinity of the injection sites from her Lovenox but there is no significant ecchymosis or bruising to suggest it being really tender because of it.  Labs:  Recent Labs    03/17/21 0428 03/18/21 0501  WBC 6.7 5.6  HGB 11.2* 11.6*  HCT 34.8* 34.6*  PLT 249 272   Recent Labs    03/16/21 0434 03/17/21 0428  NA 139 138  K 4.0 3.9  CL 106 104  CO2 25 27  GLUCOSE 86 108*  BUN 9 7*  CREATININE 0.58 0.56  CALCIUM 8.1* 8.6*   Recent Labs    03/15/21 1511  LABPROT 13.5  INR 1.0    Imaging: No results found.  Assessment/Plan: This is a 75 y.o. female with acute diverticulitis with 2 pericolonic abscesses not amenable for drainage.  - Patient's white blood cell count is stable and normal at 5.6 today.  However she is now having a little more discomfort in the right lower quadrant which was not there  before.  Potentially this could be related to the abscess that she has that is closer to the pelvis.  However there is also the area where she has been getting heparin injections.  Agree with obtaining CT scan of the abdomen pelvis overnight or early next morning so that we can reevaluate the abscess and diverticulitis.  Discussed with her that if there is improvement we will start advancing her diet.  However if there is worsening abscess still not amenable for drainage, we would have to discuss with her the potential role for surgical intervention. - For now, continue clear liquid diet and IV antibiotic management. - We will continue following along with you.   Melvyn Neth, [facility]

## 2021-03-18 NOTE — Progress Notes (Signed)
1        [place] at [place] NAME: Maria Spencer    MR#:  [id number]  DATE OF BIRTH:  05/22/1946  SUBJECTIVE:  CHIEF COMPLAINT:   Chief Complaint  Patient presents with  . Abdominal Pain  Complains of right lower quadrant pain -not sure if it is from subcu heparin short versus worsening diverticulitis/abscess REVIEW OF SYSTEMS:  Review of Systems  Constitutional: Negative for diaphoresis, fever, malaise/fatigue and weight loss.  HENT: Negative for ear discharge, ear pain, hearing loss, nosebleeds, sore throat and tinnitus.   Eyes: Negative for blurred vision and pain.  Respiratory: Negative for cough, hemoptysis, shortness of breath and wheezing.   Cardiovascular: Negative for chest pain, palpitations, orthopnea and leg swelling.  Gastrointestinal: Positive for abdominal pain and nausea. Negative for blood in stool, constipation, diarrhea, heartburn and vomiting.  Genitourinary: Negative for dysuria, frequency and urgency.  Musculoskeletal: Negative for back pain and myalgias.  Skin: Negative for itching and rash.  Neurological: Negative for dizziness, tingling, tremors, focal weakness, seizures, weakness and headaches.  Psychiatric/Behavioral: Negative for depression. The patient is not nervous/anxious.    DRUG ALLERGIES:   Allergies  Allergen Reactions  . Sulfa Antibiotics Anaphylaxis, Hives, Itching, Shortness Of Breath and Swelling    Lips & throat swelled  . Nitrofuran Derivatives Swelling    Lip swelling; "eyes get red and puffy"  . Amlodipine Besylate Other (See Comments)    Gas, bloating   Gas, bloating   . Nitrofurantoin Hives and Other (See Comments)   VITALS:  Blood pressure 127/61, pulse 65, temperature 98 F (36.7 C), temperature source Oral, resp. rate 18, height 4\' 11"  (1.499 m), weight 58.1 kg, SpO2 97 %. PHYSICAL EXAMINATION:  Physical Exam  75 year old female sitting in the chair comfortably without any acute distress Lungs  clear to auscultation bilaterally no wheezing rales rhonchi or crepitation Cardiovascular S1-S2 normal no murmur rales or gallop Abdomen soft, right lower quadrant tenderness present.  No organomegaly or masses, no guarding or rigidity Neuro alert and oriented, nonfocal Skin skin no rash or lesion LABORATORY PANEL:  Female CBC Recent Labs  Lab 03/18/21 0501  WBC 5.6  HGB 11.6*  HCT 34.6*  PLT 272   ------------------------------------------------------------------------------------------------------------------ Chemistries  Recent Labs  Lab 03/15/21 0929 03/16/21 0434 03/17/21 0428  NA 137   < > 138  K 3.5   < > 3.9  CL 103   < > 104  CO2 24   < > 27  GLUCOSE 134*   < > 108*  BUN 14   < > 7*  CREATININE 0.54   < > 0.56  CALCIUM 8.9   < > 8.6*  AST 22  --   --   ALT 15  --   --   ALKPHOS 84  --   --   BILITOT 0.5  --   --    < > = values in this interval not displayed.   RADIOLOGY:  No results found. ASSESSMENT AND PLAN:  75 y.o. female with medical history significant of hypertension, hyperlipidemia, hypothyroidism, breast cancer (s/p for right lumpectomy, radiation therapy), COVID-19 infection, recurrent UTI, kidney stone, back pain, recurrent diverticulitis is admitted for.  Sepsis due to diverticulitis of large intestine with abscess:  Present on admission.  Now resolved. Currently hemodynamically stable. -Conservative management for now -IV Zosyn -As needed morphine, Percocet for pain, Zofran for nausea -- Clear liquid diets to get a CT tomorrow morning to evaluate for  abscess and advance to full liquid as tolerated.  If her CT shows any worsening she may need surgical approach.  Hypothyroidism -Synthroid  Hyperlipidemia -Lipitor  Essential hypertension -Metoprolol -IV hydralazine as needed  Oral thrush -Nystatin  Body mass index is 25.85 kg/m.  Net IO Since Admission: 4,662.45 mL [03/18/21 1557]      Status is: Inpatient  Remains inpatient  appropriate because:Ongoing active pain requiring inpatient pain management, Ongoing diagnostic testing needed not appropriate for outpatient work up and IV treatments appropriate due to intensity of illness or inability to take PO   Dispo: The patient is from: Home              Anticipated d/c is to: Home in 1 to 2 days depending on CT results and surgical management              Patient currently is not medically stable to d/c.   Difficult to place patient No  DVT prophylaxis:       heparin injection 5,000 Units Start: 03/15/21 2200     Family Communication:  "discussed with patient")   All the records are reviewed and case discussed with Care Management/Social Worker. Management plans discussed with the patient, nursing and they are in agreement.  CODE STATUS: Partial Code Level of care: Med-Surg  TOTAL TIME TAKING CARE OF THIS PATIENT: 35 minutes.   More than 50% of the time was spent in counseling/coordination of care: YES  POSSIBLE D/C IN 1-2 DAYS, DEPENDING ON CLINICAL CONDITION.   Max Sane M.D on 03/18/2021 at 3:57 PM  [facility]   CC: Primary care physician; McLean-Scocuzza, Nino Glow, MD  Note: This dictation was prepared with Dragon dictation along with smaller phrase technology. Any transcriptional errors that result from this process are unintentional.

## 2021-03-19 ENCOUNTER — Inpatient Hospital Stay: Payer: Medicare Other

## 2021-03-19 ENCOUNTER — Other Ambulatory Visit: Payer: Self-pay | Admitting: Internal Medicine

## 2021-03-19 ENCOUNTER — Encounter: Payer: Self-pay | Admitting: Internal Medicine

## 2021-03-19 DIAGNOSIS — B370 Candidal stomatitis: Secondary | ICD-10-CM

## 2021-03-19 DIAGNOSIS — I10 Essential (primary) hypertension: Secondary | ICD-10-CM

## 2021-03-19 LAB — BASIC METABOLIC PANEL
Anion gap: 8 (ref 5–15)
BUN: <5 mg/dL — ABNORMAL LOW (ref 8–23)
CO2: 28 mmol/L (ref 22–32)
Calcium: 8.8 mg/dL — ABNORMAL LOW (ref 8.9–10.3)
Chloride: 102 mmol/L (ref 98–111)
Creatinine, Ser: 0.72 mg/dL (ref 0.44–1.00)
GFR, Estimated: >60 mL/min (ref >60)
Glucose, Bld: 101 mg/dL — ABNORMAL HIGH (ref 70–99)
Potassium: 3.8 mmol/L (ref 3.5–5.1)
Sodium: 138 mmol/L (ref 135–145)

## 2021-03-19 LAB — CBC
HCT: 37.6 % (ref 36.0–46.0)
Hemoglobin: 12.3 g/dL (ref 12.0–15.0)
MCH: 26.7 pg (ref 26.0–34.0)
MCHC: 32.7 g/dL (ref 30.0–36.0)
MCV: 81.6 fL (ref 80.0–100.0)
Platelets: 287 10*3/uL (ref 150–400)
RBC: 4.61 MIL/uL (ref 3.87–5.11)
RDW: 14.8 % (ref 11.5–15.5)
WBC: 5.5 10*3/uL (ref 4.0–10.5)
nRBC: 0 % (ref 0.0–0.2)

## 2021-03-19 LAB — GLUCOSE, CAPILLARY: Glucose-Capillary: 123 mg/dL — ABNORMAL HIGH (ref 70–99)

## 2021-03-19 IMAGING — CT CT ABD-PELV W/ CM
2 of 7 series · 13 of 46 positions shown, 15 images · IV contrast (APPLIED)
Comparison: [DATE].

CLINICAL DATA: Diverticulitis.

EXAM:
CT ABDOMEN AND PELVIS WITH CONTRAST
TECHNIQUE: Multidetector CT imaging of the abdomen and pelvis was performed
using the standard protocol following bolus administration of
intravenous contrast.
CONTRAST:  100mL OMNIPAQUE IOHEXOL 300 MG/ML  SOLN

[Series 2: routine abd/pel with · axial · 0.93mm/px · z∈[-451,-101]mm · 10 of 86 slices shown, 12 images]
[im 8/86  soft-tissue]
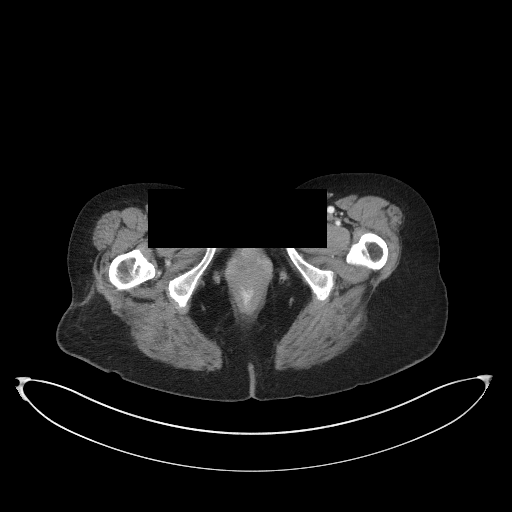
[im 8/86  bone]
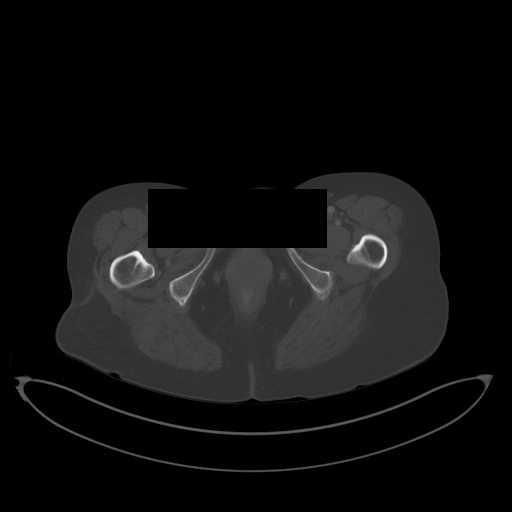
[im 16/86  soft-tissue]
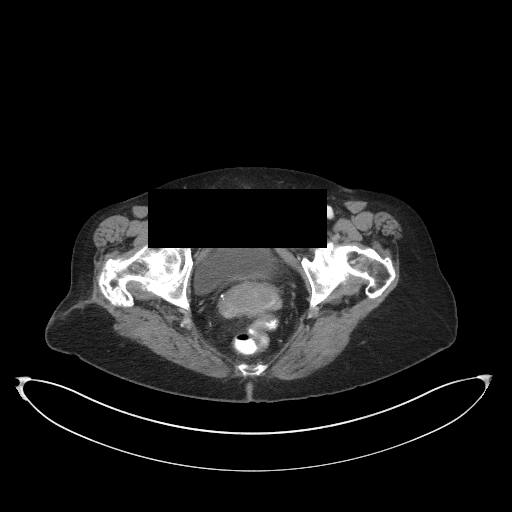
[im 24/86  soft-tissue]
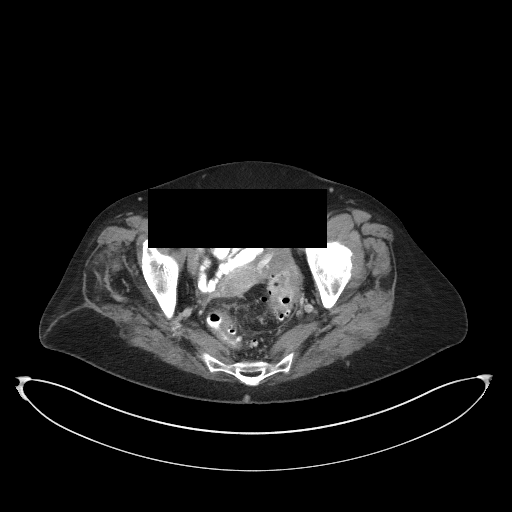
[im 31/86  soft-tissue]
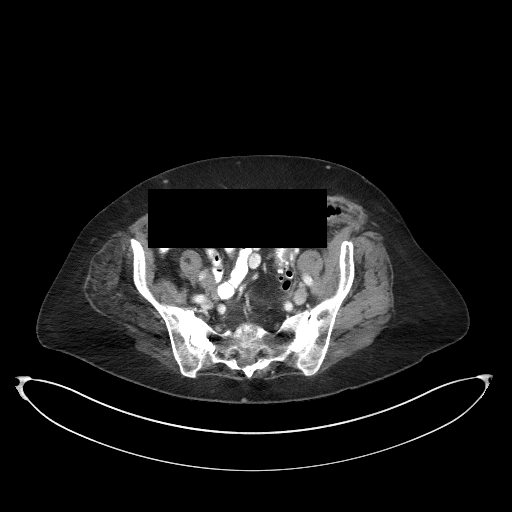
[im 39/86  soft-tissue]
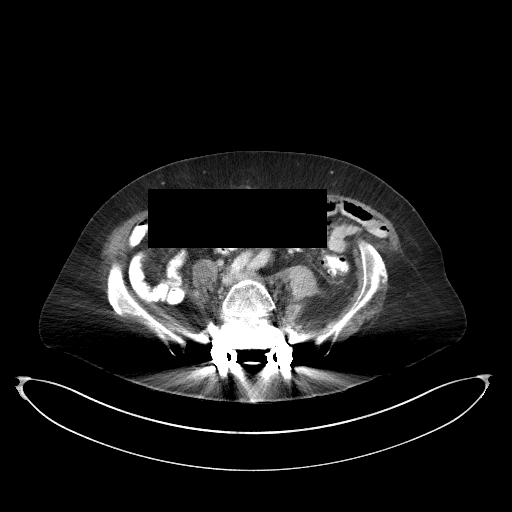
[im 47/86  soft-tissue]
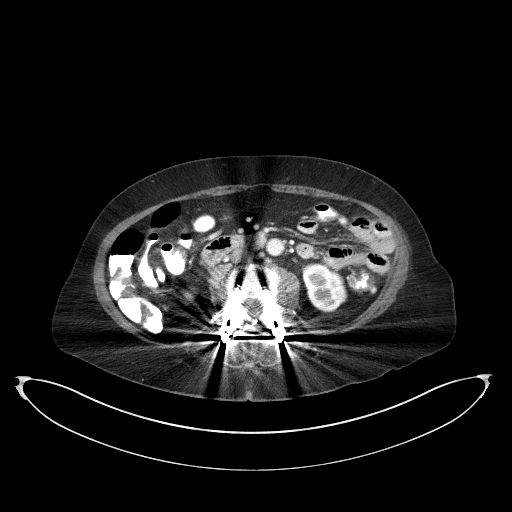
[im 55/86  soft-tissue]
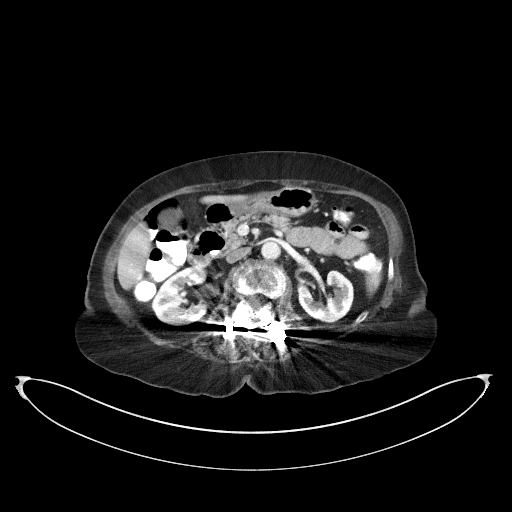
[im 62/86  soft-tissue]
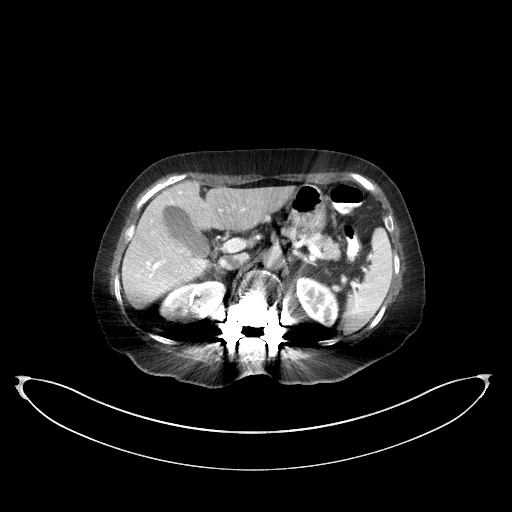
[im 70/86  soft-tissue]
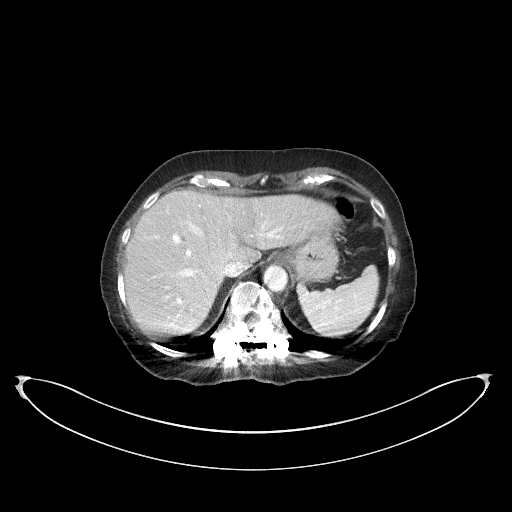
[im 70/86  bone]
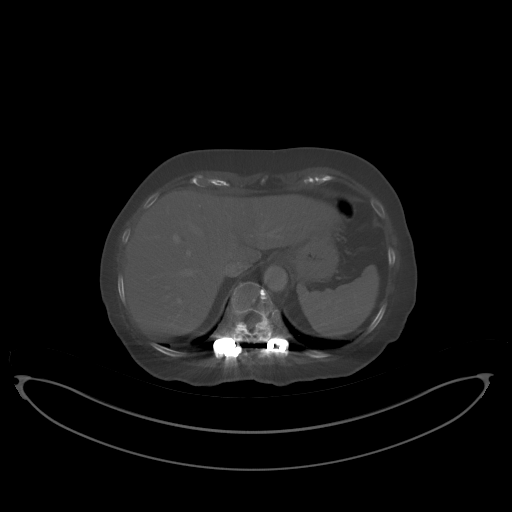
[im 78/86  soft-tissue]
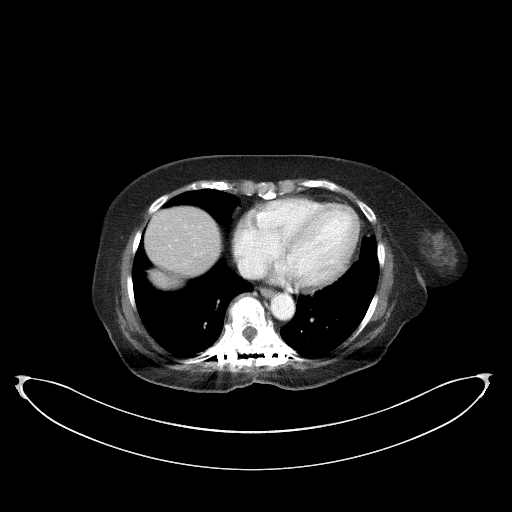

[Series 7: coronal st · coronal · 0.79mm/px · 3 of 81 slices shown]
[im 27/81  soft-tissue]
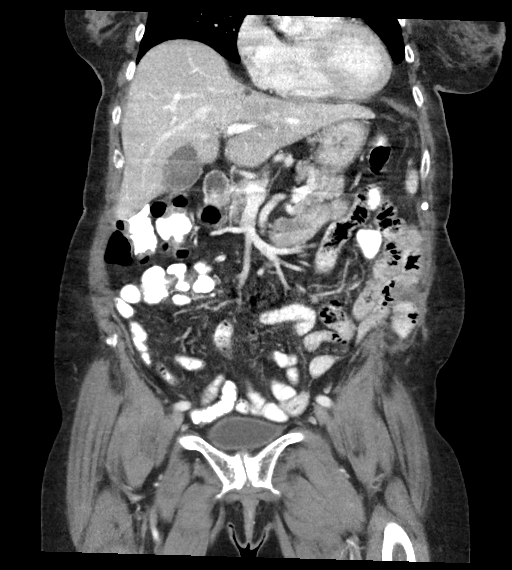
[im 36/81  soft-tissue]
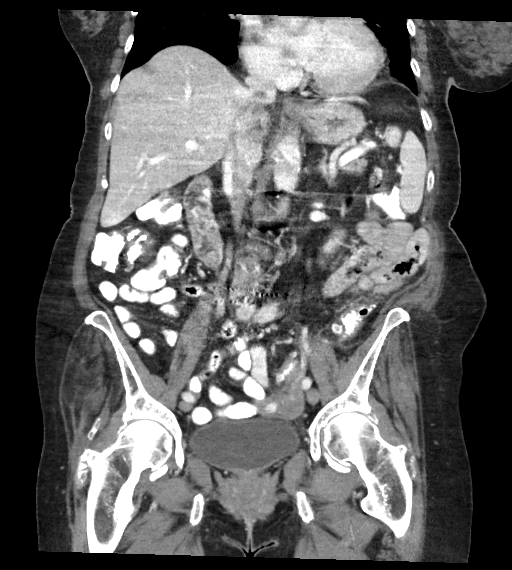
[im 45/81  soft-tissue]
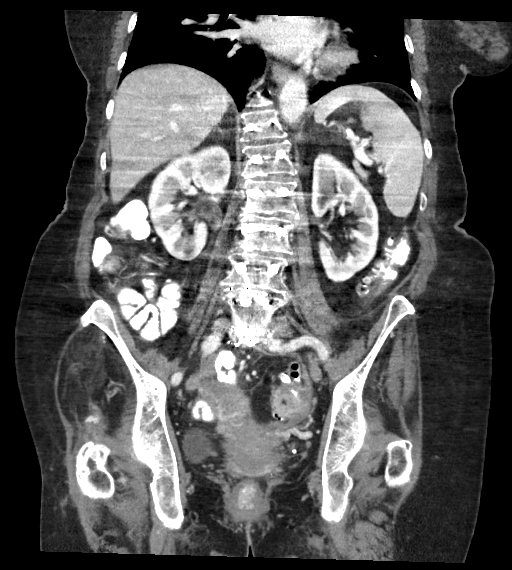

[13 of 46 positions shown; findings below may reference images not displayed]

FINDINGS: Lower chest: No acute abnormality.

Hepatobiliary: No focal liver abnormality is seen. No gallstones,
gallbladder wall thickening, or biliary dilatation.

Pancreas: Unremarkable. No pancreatic ductal dilatation or
surrounding inflammatory changes.

Spleen: Normal in size without focal abnormality.

Adrenals/Urinary Tract: Adrenal glands are unremarkable. Kidneys are
normal, without renal calculi, focal lesion, or hydronephrosis.
Bladder is unremarkable.

Stomach/Bowel: Stomach appears normal. There is no evidence of bowel
obstruction. Sigmoid diverticulitis noted on prior exam appears to
be mildly improved. The abscesses noted on prior exam appear to have
nearly resolved.

Vascular/Lymphatic: Aortic atherosclerosis. No enlarged abdominal or
pelvic lymph nodes.

Reproductive: Uterus and bilateral adnexa are unremarkable.

Other: No abdominal wall hernia or abnormality. No abdominopelvic
ascites.

Musculoskeletal: Postsurgical changes are seen involving the
visualized thoracic and lumbar spine and sacrum. No acute
abnormality is noted.
IMPRESSION: Sigmoid diverticulitis noted on prior exam appears to be mildly
improved currently. The abscesses noted on prior exam appear to have
nearly resolved.

Aortic Atherosclerosis ([4C]-[4C]).

## 2021-03-19 MED ORDER — IOHEXOL 300 MG/ML  SOLN
100.0000 mL | Freq: Once | INTRAMUSCULAR | Status: AC | PRN
  Administered 2021-03-19: 100 mL via INTRAVENOUS

## 2021-03-19 MED ORDER — IOHEXOL 9 MG/ML PO SOLN
500.0000 mL | ORAL | Status: AC
  Administered 2021-03-19 (×2): 500 mL via ORAL

## 2021-03-19 NOTE — Progress Notes (Signed)
[facility] SURGICAL PROGRESS NOTE (cpt [phone])  Hospital Day(s): 4.   Interval History: Patient seen and examined, no acute events or new complaints overnight. Patient reports she is feeling better. Her biggest complaint is mostly the diarrhea associated with oral contrast. No fever, chills, nausea, emesis. Her leukocytosis remains resolved at 5.5K. BMP is reassuring. Plan today for repeat CT scan.   Review of Systems:  Constitutional: denies fever, chills  HEENT: denies cough or congestion  Respiratory: denies any shortness of breath  Cardiovascular: denies chest pain or palpitations  Gastrointestinal: denies abdominal pain, N/V, or diarrhea/and bowel function as per interval history Genitourinary: denies burning with urination or urinary frequency  Vital signs in last 24 hours: [min-max] current  Temp:  [97.7 F (36.5 C)-98.7 F (37.1 C)] 97.7 F (36.5 C) (04/25 0846) Pulse Rate:  [60-70] 68 (04/25 0846) Resp:  [16-18] 16 (04/25 0846) BP: (129-155)/(65-89) 147/65 (04/25 0846) SpO2:  [97 %-100 %] 100 % (04/25 0846)     Height: 4\' 11"  (149.9 cm) Weight: 58.1 kg BMI (Calculated): 25.84   Intake/Output last 2 shifts:  04/24 0701 - 04/25 0700 In: 1497.3 [P.O.:1140; I.V.:262.3; IV Piggyback:95] Out: -    Physical Exam:  Constitutional: alert, cooperative and no distress  HENT: normocephalic without obvious abnormality  Eyes: PERRL, EOM's grossly intact and symmetric  Respiratory: breathing non-labored at rest  Cardiovascular: regular rate and sinus rhythm  Gastrointestinal: Soft, non-tender, and non-distended, no rebind/guarding, no peritonitis  Musculoskeletal: no edema or wounds, motor and sensation grossly intact, NT    Labs:  CBC Latest Ref Rng & Units 03/19/2021 03/18/2021 03/17/2021  WBC 4.0 - 10.5 K/uL 5.5 5.6 6.7  Hemoglobin 12.0 - 15.0 g/dL 12.3 11.6(L) 11.2(L)  Hematocrit 36.0 - 46.0 % 37.6 34.6(L) 34.8(L)  Platelets 150 - 400 K/uL 287 272 249   CMP  Latest Ref Rng & Units 03/19/2021 03/17/2021 03/16/2021  Glucose 70 - 99 mg/dL 101(H) 108(H) 86  BUN 8 - 23 mg/dL <5(L) 7(L) 9  Creatinine 0.44 - 1.00 mg/dL 0.72 0.56 0.58  Sodium 135 - 145 mmol/L 138 138 139  Potassium 3.5 - 5.1 mmol/L 3.8 3.9 4.0  Chloride 98 - 111 mmol/L 102 104 106  CO2 22 - 32 mmol/L 28 27 25   Calcium 8.9 - 10.3 mg/dL 8.8(L) 8.6(L) 8.1(L)  Total Protein 6.5 - 8.1 g/dL - - -  Total Bilirubin 0.3 - 1.2 mg/dL - - -  Alkaline Phos 38 - 126 U/L - - -  AST 15 - 41 U/L - - -  ALT 0 - 44 U/L - - -    Imaging studies: No new pertinent imaging studies   Assessment/Plan: (ICD-10's: K3.20) 75 y.o. female with resolved leukocytosis and improvement in abdominal pain admitted with acute diverticulitis with abscess, complicated bymultiplepertinent comorbidities   - Will repeat CT Abdomen/Pelvis today to ensure no worsening of her intra-abdominal process   - Pending CT results, may be able to advance diet   - No emergent surgical intervention; She will benefit from evaluation as outpatient for elective sigmoid colectomy given this episode of complicated diverticulitis    - Agree with IV Abx (Zosyn); transition to PO for home with Augmentin; recommend 14 days total (IV + PO) - Monitor abdominal examination; on-going bowel function - Pain control prn; antiemetics prn - Monitor leukocytosis; resolved - Further management per primary service; we will of course follow    - Discharge Planning: If no significant abnormalities with CT and she can tolerate  advancement of diet, she may be able to discharge home this afternoon   All of the above findings and recommendations were discussed with the patient, and the medical team, and all of patient's questions were answered to her expressed satisfaction.  -- Edison Simon, PA-C [facility] 03/19/2021, 10:33 AM [phone] M-F: 7am - 4pm

## 2021-03-19 NOTE — Care Management Important Message (Signed)
Important Message  Patient Details  Name: Maria Spencer MRN: [id number] Date of Birth: 1946-04-24   Medicare Important Message Given:  Yes     Dannette Barbara 03/19/2021, 11:22 AM

## 2021-03-19 NOTE — Progress Notes (Signed)
1        [place] [facility] [place] NAME: Maria Spencer    MR#:  [id number]  DATE OF BIRTH:  04-09-1946  SUBJECTIVE:  CHIEF COMPLAINT:   Chief Complaint  Patient presents with  . Abdominal Pain  not wanting to drink contrast for CT as makes her having diarrhea. After discussing reasons, she is willing to try with imodium REVIEW OF SYSTEMS:  Review of Systems  Constitutional: Negative for diaphoresis, fever, malaise/fatigue and weight loss.  HENT: Negative for ear discharge, ear pain, hearing loss, nosebleeds, sore throat and tinnitus.   Eyes: Negative for blurred vision and pain.  Respiratory: Negative for cough, hemoptysis, shortness of breath and wheezing.   Cardiovascular: Negative for chest pain, palpitations, orthopnea and leg swelling.  Gastrointestinal: Positive for nausea. Negative for blood in stool, constipation, diarrhea and heartburn.  Genitourinary: Negative for dysuria, frequency and urgency.  Musculoskeletal: Negative for back pain and myalgias.  Skin: Negative for itching and rash.  Neurological: Negative for dizziness, tingling, tremors, focal weakness, seizures, weakness and headaches.  Psychiatric/Behavioral: Negative for depression. The patient is not nervous/anxious.    DRUG ALLERGIES:   Allergies  Allergen Reactions  . Sulfa Antibiotics Anaphylaxis, Hives, Itching, Shortness Of Breath and Swelling    Lips & throat swelled  . Nitrofuran Derivatives Swelling    Lip swelling; "eyes get red and puffy"  . Amlodipine Besylate Other (See Comments)    Gas, bloating   Gas, bloating   . Nitrofurantoin Hives and Other (See Comments)   VITALS:  Blood pressure (!) 147/74, pulse 72, temperature 97.7 F (36.5 C), temperature source Oral, resp. rate 16, height 4\' 11"  (1.499 m), weight 58.1 kg, SpO2 99 %. PHYSICAL EXAMINATION:  Physical Exam  75 year old female sitting in the chair comfortably without any acute distress Lungs clear to  auscultation bilaterally no wheezing rales rhonchi or crepitation Cardiovascular S1-S2 normal no murmur rales or gallop Abdomen soft, right lower quadrant tenderness present.  No organomegaly or masses, no guarding or rigidity Neuro alert and oriented, nonfocal Skin skin no rash or lesion LABORATORY PANEL:  Female CBC Recent Labs  Lab 03/19/21 0505  WBC 5.5  HGB 12.3  HCT 37.6  PLT 287   ------------------------------------------------------------------------------------------------------------------ Chemistries  Recent Labs  Lab 03/15/21 0929 03/16/21 0434 03/19/21 0505  NA 137   < > 138  K 3.5   < > 3.8  CL 103   < > 102  CO2 24   < > 28  GLUCOSE 134*   < > 101*  BUN 14   < > <5*  CREATININE 0.54   < > 0.72  CALCIUM 8.9   < > 8.8*  AST 22  --   --   ALT 15  --   --   ALKPHOS 84  --   --   BILITOT 0.5  --   --    < > = values in this interval not displayed.   RADIOLOGY:  CT ABDOMEN PELVIS W CONTRAST  Result Date: 03/19/2021 CLINICAL DATA:  Diverticulitis. EXAM: CT ABDOMEN AND PELVIS WITH CONTRAST TECHNIQUE: Multidetector CT imaging of the abdomen and pelvis was performed using the standard protocol following bolus administration of intravenous contrast. CONTRAST:  166mL OMNIPAQUE IOHEXOL 300 MG/ML  SOLN COMPARISON:  March 15, 2021. FINDINGS: Lower chest: No acute abnormality. Hepatobiliary: No focal liver abnormality is seen. No gallstones, gallbladder wall thickening, or biliary dilatation. Pancreas: Unremarkable. No pancreatic ductal dilatation or surrounding inflammatory changes.  Spleen: Normal in size without focal abnormality. Adrenals/Urinary Tract: Adrenal glands are unremarkable. Kidneys are normal, without renal calculi, focal lesion, or hydronephrosis. Bladder is unremarkable. Stomach/Bowel: Stomach appears normal. There is no evidence of bowel obstruction. Sigmoid diverticulitis noted on prior exam appears to be mildly improved. The abscesses noted on prior exam  appear to have nearly resolved. Vascular/Lymphatic: Aortic atherosclerosis. No enlarged abdominal or pelvic lymph nodes. Reproductive: Uterus and bilateral adnexa are unremarkable. Other: No abdominal wall hernia or abnormality. No abdominopelvic ascites. Musculoskeletal: Postsurgical changes are seen involving the visualized thoracic and lumbar spine and sacrum. No acute abnormality is noted. IMPRESSION: Sigmoid diverticulitis noted on prior exam appears to be mildly improved currently. The abscesses noted on prior exam appear to have nearly resolved. Aortic Atherosclerosis (ICD10-I70.0). Electronically Signed   By: Marijo Conception M.D.   On: 03/19/2021 11:59   ASSESSMENT AND PLAN:  75 y.o. female with medical history significant of hypertension, hyperlipidemia, hypothyroidism, breast cancer (s/p for right lumpectomy, radiation therapy), COVID-19 infection, recurrent UTI, kidney stone, back pain, recurrent diverticulitis is admitted for.  Sepsis due to diverticulitis of large intestine with abscess:  Present on admission.  Now resolved. Currently hemodynamically stable. -Conservative management for now -IV Zosyn -CT today shows improvement - advance to soft diet per surgery.  Hypothyroidism -Synthroid  Hyperlipidemia -Lipitor  Essential hypertension -Metoprolol -IV hydralazine as needed  Oral thrush -Nystatin  Body mass index is 25.85 kg/m.  Net IO Since Admission: 5,616.7 mL [03/19/21 2131]      Status is: Inpatient  Remains inpatient appropriate because:Ongoing active pain requiring inpatient pain management, Ongoing diagnostic testing needed not appropriate for outpatient work up and IV treatments appropriate due to intensity of illness or inability to take PO   Dispo: The patient is from: Home              Anticipated d/c is to: Home in 1 day              Patient currently is not medically stable to d/c.   Difficult to place patient No  DVT prophylaxis:        heparin injection 5,000 Units Start: 03/15/21 2200     Family Communication:  "discussed with patient")   All the records are reviewed and case discussed with Care Management/Social Worker. Management plans discussed with the patient, nursing and they are in agreement.  CODE STATUS: Partial Code Level of care: Med-Surg  TOTAL TIME TAKING CARE OF THIS PATIENT: 35 minutes.   More than 50% of the time was spent in counseling/coordination of care: YES  POSSIBLE D/C IN 1 DAYS, DEPENDING ON CLINICAL CONDITION.   Max Sane M.D on 03/19/2021 at 9:31 PM  [facility]   CC: Primary care physician; McLean-Scocuzza, Nino Glow, MD  Note: This dictation was prepared with Dragon dictation along with smaller phrase technology. Any transcriptional errors that result from this process are unintentional.

## 2021-03-20 ENCOUNTER — Telehealth: Payer: Self-pay | Admitting: Internal Medicine

## 2021-03-20 DIAGNOSIS — L0291 Cutaneous abscess, unspecified: Secondary | ICD-10-CM

## 2021-03-20 LAB — CULTURE, BLOOD (ROUTINE X 2)
Culture: NO GROWTH
Culture: NO GROWTH
Special Requests: ADEQUATE
Special Requests: ADEQUATE

## 2021-03-20 LAB — GLUCOSE, CAPILLARY: Glucose-Capillary: 100 mg/dL — ABNORMAL HIGH (ref 70–99)

## 2021-03-20 MED ORDER — AMOXICILLIN-POT CLAVULANATE 875-125 MG PO TABS: 1.0000 | ORAL_TABLET | Freq: Two times a day (BID) | ORAL | 0 refills | Status: DC

## 2021-03-20 NOTE — Progress Notes (Signed)
IV removed and site benign.  Alert and oriented.  Reviewed discharge instructions and verbalized understanding

## 2021-03-20 NOTE — Plan of Care (Signed)
  Problem: Education: Goal: Knowledge of General Education information will improve Description: Including pain rating scale, medication(s)/side effects and non-pharmacologic comfort measures 03/20/2021 1043 by Vivien Rota, RN Outcome: Adequate for Discharge 03/20/2021 0806 by Vivien Rota, RN Outcome: Progressing   Problem: Health Behavior/Discharge Planning: Goal: Ability to manage health-related needs will improve 03/20/2021 1043 by Vivien Rota, RN Outcome: Adequate for Discharge 03/20/2021 0806 by Vivien Rota, RN Outcome: Progressing   Problem: Clinical Measurements: Goal: Ability to maintain clinical measurements within normal limits will improve 03/20/2021 1043 by Vivien Rota, RN Outcome: Adequate for Discharge 03/20/2021 0806 by Vivien Rota, RN Outcome: Progressing Goal: Will remain free from infection 03/20/2021 1043 by Vivien Rota, RN Outcome: Adequate for Discharge 03/20/2021 0806 by Vivien Rota, RN Outcome: Progressing Goal: Diagnostic test results will improve 03/20/2021 1043 by Vivien Rota, RN Outcome: Adequate for Discharge 03/20/2021 0806 by Vivien Rota, RN Outcome: Progressing Goal: Respiratory complications will improve 03/20/2021 1043 by Vivien Rota, RN Outcome: Adequate for Discharge 03/20/2021 0806 by Vivien Rota, RN Outcome: Progressing Goal: Cardiovascular complication will be avoided 03/20/2021 1043 by Vivien Rota, RN Outcome: Adequate for Discharge 03/20/2021 0806 by Vivien Rota, RN Outcome: Progressing   Problem: Activity: Goal: Risk for activity intolerance will decrease 03/20/2021 1043 by Vivien Rota, RN Outcome: Adequate for Discharge 03/20/2021 0806 by Vivien Rota, RN Outcome: Progressing   Problem: Nutrition: Goal: Adequate nutrition will be maintained 03/20/2021 1043 by Vivien Rota, RN Outcome:  Adequate for Discharge 03/20/2021 0806 by Vivien Rota, RN Outcome: Progressing   Problem: Coping: Goal: Level of anxiety will decrease 03/20/2021 1043 by Vivien Rota, RN Outcome: Adequate for Discharge 03/20/2021 0806 by Vivien Rota, RN Outcome: Progressing   Problem: Elimination: Goal: Will not experience complications related to bowel motility 03/20/2021 1043 by Vivien Rota, RN Outcome: Adequate for Discharge 03/20/2021 0806 by Vivien Rota, RN Outcome: Progressing Goal: Will not experience complications related to urinary retention 03/20/2021 1043 by Vivien Rota, RN Outcome: Adequate for Discharge 03/20/2021 0806 by Vivien Rota, RN Outcome: Progressing   Problem: Pain Managment: Goal: General experience of comfort will improve 03/20/2021 1043 by Vivien Rota, RN Outcome: Adequate for Discharge 03/20/2021 0806 by Vivien Rota, RN Outcome: Progressing   Problem: Safety: Goal: Ability to remain free from injury will improve 03/20/2021 1043 by Vivien Rota, RN Outcome: Adequate for Discharge 03/20/2021 0806 by Vivien Rota, RN Outcome: Progressing   Problem: Skin Integrity: Goal: Risk for impaired skin integrity will decrease 03/20/2021 1043 by Vivien Rota, RN Outcome: Adequate for Discharge 03/20/2021 0806 by Vivien Rota, RN Outcome: Progressing

## 2021-03-20 NOTE — Progress Notes (Addendum)
[facility] SURGICAL PROGRESS NOTE (cpt [id number])  Hospital Day(s): 5.   Interval History: Patient seen and examined, no acute events or new complaints overnight. Patient reports she is doing well and ready to go home. She denied any fever, chills, nausea, emesis. No new labs this morning. Her CT Abdomen/Pelvis was improved with resolution in her previously seen abscess. Plans for discharge home today.   Review of Systems:  Constitutional: denies fever, chills  HEENT: denies cough or congestion  Respiratory: denies any shortness of breath  Cardiovascular: denies chest pain or palpitations  Gastrointestinal: denies abdominal pain, N/V, or diarrhea/and bowel function as per interval history Genitourinary: denies burning with urination or urinary frequency   Vital signs in last 24 hours: [min-max] current  Temp:  [97.7 F (36.5 C)-98.4 F (36.9 C)] 97.9 F (36.6 C) (04/26 0551) Pulse Rate:  [68-85] 73 (04/26 0551) Resp:  [14-18] 18 (04/26 0551) BP: (118-147)/(59-80) 118/59 (04/26 0551) SpO2:  [96 %-100 %] 96 % (04/26 0551)     Height: 4\' 11"  (149.9 cm) Weight: 58.1 kg BMI (Calculated): 25.84   Intake/Output last 2 shifts:  04/25 0701 - 04/26 0700 In: 487.2 [P.O.:240; I.V.:52.6; IV Piggyback:194.7] Out: -    Physical Exam:  Constitutional: alert, cooperative and no distress  HENT: normocephalic without obvious abnormality  Eyes: PERRL, EOM's grossly intact and symmetric  Respiratory: breathing non-labored at rest  Cardiovascular: regular rate and sinus rhythm  Gastrointestinal: Soft, non-tender, and non-distended, no rebound/guarding Musculoskeletal: no edema or wounds, motor and sensation grossly intact, NT    Labs:  CBC Latest Ref Rng & Units 03/19/2021 03/18/2021 03/17/2021  WBC 4.0 - 10.5 K/uL 5.5 5.6 6.7  Hemoglobin 12.0 - 15.0 g/dL 12.3 11.6(L) 11.2(L)  Hematocrit 36.0 - 46.0 % 37.6 34.6(L) 34.8(L)  Platelets 150 - 400 K/uL 287 272 249   CMP Latest Ref Rng  & Units 03/19/2021 03/17/2021 03/16/2021  Glucose 70 - 99 mg/dL 101(H) 108(H) 86  BUN 8 - 23 mg/dL <5(L) 7(L) 9  Creatinine 0.44 - 1.00 mg/dL 0.72 0.56 0.58  Sodium 135 - 145 mmol/L 138 138 139  Potassium 3.5 - 5.1 mmol/L 3.8 3.9 4.0  Chloride 98 - 111 mmol/L 102 104 106  CO2 22 - 32 mmol/L 28 27 25   Calcium 8.9 - 10.3 mg/dL 8.8(L) 8.6(L) 8.1(L)  Total Protein 6.5 - 8.1 g/dL - - -  Total Bilirubin 0.3 - 1.2 mg/dL - - -  Alkaline Phos 38 - 126 U/L - - -  AST 15 - 41 U/L - - -  ALT 0 - 44 U/L - - -     Imaging studies: No new pertinent imaging studies   Assessment/Plan: (ICD-10's: K60.20) 75 y.o. female with resolved leukocytosis and abdominal pain admitted withacute diverticulitis with abscess, complicated bymultiplepertinent comorbidities   - Continue diet; reviewed recommendation for home  - Agree with IV Abx (Zosyn); transition to PO for home with Augmentin; recommend 14 days total (IV + PO)  - No emergent surgical intervention; She will benefit from evaluation as outpatient for elective sigmoid colectomy given this episode of complicated diverticulitis   - Monitor abdominal examination; on-going bowel function - Pain control prn; antiemetics prn - Monitor leukocytosis;resolved - Further management per primary service   - Discharge Planning: Okay for discharge from surgical standpoint, ABx recommendations as above, she can follow up in 2-3 weeks for reassessment and discussion for potential elective sigmoid colectomy   All of the above findings and recommendations were discussed with  the patient, and the medical team, and all of patient's questions were answered to her expressed satisfaction.  -- Edison Simon, PA-C [facility] 03/20/2021, 7:09 AM [phone] M-F: 7am - 4pm

## 2021-03-20 NOTE — Plan of Care (Signed)

## 2021-03-20 NOTE — Telephone Encounter (Signed)
Patient is being released from the hospital today. Hospital follow up scheduled for 03/23/21 with provider.

## 2021-03-20 NOTE — Discharge Summary (Signed)
[facility] NAME: Maria Spencer    MR#:  [id number]  DATE OF BIRTH:  [birth year]  DATE OF ADMISSION:  03/15/2021   ADMITTING PHYSICIAN: Ivor Costa, MD  DATE OF DISCHARGE: 03/20/2021  PRIMARY CARE PHYSICIAN: McLean-Scocuzza, Nino Glow, MD   ADMISSION DIAGNOSIS:  Abscess [L02.91] Diverticulitis [K57.92] Diverticulitis of large intestine with abscess [K57.20] Sepsis, due to unspecified organism, unspecified whether acute organ dysfunction present ([place]) [A41.9] DISCHARGE DIAGNOSIS:  Principal Problem:   Diverticulitis of large intestine with abscess Active Problems:   Hypothyroidism   Hyperlipidemia   Essential hypertension   Sepsis ([place])   Oral thrush   Abscess  SECONDARY DIAGNOSIS:   Past Medical History:  Diagnosis Date  . Arthritis    neck, back, left knee;   . Breast cancer ([place]) 2009   right lumpectomy s/p radiation x 1 week 2x per day only   . Chicken pox   . Chronic UTI    established with urology   . COVID-19    ? 12/2020 sob   . Diverticular disease    -osis and -itis   . Esophagitis    egd 05/17/14 see report scanned into chart  . History of kidney stones   . Hormone disorder   . Hyperlipidemia   . Hypertension    controlled well with medication;   . Hypothyroidism   . Osteoporosis   . Personal history of radiation therapy 2009   F/U right breast cancer  . Thyroid disease    hypothyroidism    HOSPITAL COURSE:  75 y.o.femalewith medical history significant ofhypertension, hyperlipidemia, hypothyroidism, breast cancer (s/p for right lumpectomy, radiation therapy), COVID-19 infection, recurrent UTI, kidney stone, back pain, recurrent diverticulitis is admitted for.  Sepsisdue to diverticulitis of large intestine with abscess: Present on admission.  Now resolved.Repeat CT showed improvement Tolerating diet.  Hypothyroidism -Synthroid  Hyperlipidemia -Lipitor  Essential hypertension -Metoprolol  Oral  thrush -Nystatin   DISCHARGE CONDITIONS:  stable CONSULTS OBTAINED:  Treatment Team:  Ronny Bacon, MD DRUG ALLERGIES:   Allergies  Allergen Reactions  . Sulfa Antibiotics Anaphylaxis, Hives, Itching, Shortness Of Breath and Swelling    Lips & throat swelled  . Nitrofuran Derivatives Swelling    Lip swelling; "eyes get red and puffy"  . Amlodipine Besylate Other (See Comments)    Gas, bloating   Gas, bloating   . Nitrofurantoin Hives and Other (See Comments)   DISCHARGE MEDICATIONS:   Allergies as of 03/20/2021      Reactions   Sulfa Antibiotics Anaphylaxis, Hives, Itching, Shortness Of Breath, Swelling   Lips & throat swelled   Nitrofuran Derivatives Swelling   Lip swelling; "eyes get red and puffy"   Amlodipine Besylate Other (See Comments)   Gas, bloating  Gas, bloating    Nitrofurantoin Hives, Other (See Comments)      Medication List    STOP taking these medications   cephALEXin 250 MG capsule Commonly known as: KEFLEX   ciprofloxacin 500 MG tablet Commonly known as: CIPRO   metroNIDAZOLE 500 MG tablet Commonly known as: FLAGYL   sennosides-docusate sodium 8.6-50 MG tablet Commonly known as: SENOKOT-S   tiZANidine 2 MG tablet Commonly known as: ZANAFLEX     TAKE these medications   amoxicillin-clavulanate 875-125 MG tablet Commonly known as: Augmentin Take 1 tablet by mouth 2 (two) times daily for 7 days. With food x 10-14 days stop cipro and flagyl   atorvastatin 20 MG tablet Commonly known as: LIPITOR Take 1  tablet (20 mg total) by mouth every other day. D/c 10 mg pill increased dose   [employer] CALCIUM 600 & VITAMIN D3 PO Take 1 tablet by mouth daily.   fluticasone 50 MCG/ACT nasal spray Commonly known as: FLONASE Place 2 sprays into both nostrils daily. Max b/l nostrils   levothyroxine 88 MCG tablet Commonly known as: SYNTHROID Take 1 tablet (88 mcg total) by mouth daily before breakfast. 30 minutes   metoprolol succinate 50 MG 24 hr  tablet Commonly known as: TOPROL-XL Take 1 tablet (50 mg total) by mouth daily. Take with or immediately following a meal.   Multi-Vitamins Tabs Take 1 tablet by mouth daily.   Potassium 99 MG Tabs Take 1 tablet by mouth daily.   telmisartan 80 MG tablet Commonly known as: Micardis Take 1 tablet (80 mg total) by mouth daily. In place of losartan 100. Take 1/2 pill day 3 days and if BP >130/>80 take 1 pill day 4 and beyond daily   vitamin B-12 1000 MCG tablet Commonly known as: CYANOCOBALAMIN Take 1,000 mcg by mouth daily.   zolpidem 5 MG tablet Commonly known as: AMBIEN Take 1 tablet (5 mg total) by mouth at bedtime as needed for sleep.      DISCHARGE INSTRUCTIONS:   DIET:  Regular diet DISCHARGE CONDITION:  Stable ACTIVITY:  Activity as tolerated OXYGEN:  Home Oxygen: No.  Oxygen Delivery: room air DISCHARGE LOCATION:  home   If you experience worsening of your admission symptoms, develop shortness of breath, life threatening emergency, suicidal or homicidal thoughts you must seek medical attention immediately by calling 911 or calling your MD immediately  if symptoms less severe.  You Must read complete instructions/literature along with all the possible adverse reactions/side effects for all the Medicines you take and that have been prescribed to you. Take any new Medicines after you have completely understood and accpet all the possible adverse reactions/side effects.   Please note  You were cared for by a hospitalist during your hospital stay. If you have any questions about your discharge medications or the care you received while you were in the hospital after you are discharged, you can call the unit and asked to speak with the hospitalist on call if the hospitalist that took care of you is not available. Once you are discharged, your primary care physician will handle any further medical issues. Please note that NO REFILLS for any discharge medications will be  authorized once you are discharged, as it is imperative that you return to your primary care physician (or establish a relationship with a primary care physician if you do not have one) for your aftercare needs so that they can reassess your need for medications and monitor your lab values.    On the day of Discharge:  VITAL SIGNS:  Blood pressure 134/71, pulse 78, temperature 97.9 F (36.6 C), temperature source Oral, resp. rate 18, height 4\' 11"  (1.499 m), weight 58.1 kg, SpO2 97 %. PHYSICAL EXAMINATION:  GENERAL:  75 y.o.-year-old patient lying in the bed with no acute distress.  EYES: Pupils equal, round, reactive to light and accommodation. No scleral icterus. Extraocular muscles intact.  HEENT: Head atraumatic, normocephalic. Oropharynx and nasopharynx clear.  NECK:  Supple, no jugular venous distention. No thyroid enlargement, no tenderness.  LUNGS: Normal breath sounds bilaterally, no wheezing, rales,rhonchi or crepitation. No use of accessory muscles of respiration.  CARDIOVASCULAR: S1, S2 normal. No murmurs, rubs, or gallops.  ABDOMEN: Soft, non-tender, non-distended. Bowel sounds present. No  organomegaly or mass.  EXTREMITIES: No pedal edema, cyanosis, or clubbing.  NEUROLOGIC: Cranial nerves II through XII are intact. Muscle strength 5/5 in all extremities. Sensation intact. Gait not checked.  PSYCHIATRIC: The patient is alert and oriented x 3.  SKIN: No obvious rash, lesion, or ulcer.  DATA REVIEW:   CBC Recent Labs  Lab 03/19/21 0505  WBC 5.5  HGB 12.3  HCT 37.6  PLT 287    Chemistries  Recent Labs  Lab 03/15/21 0929 03/16/21 0434 03/19/21 0505  NA 137   < > 138  K 3.5   < > 3.8  CL 103   < > 102  CO2 24   < > 28  GLUCOSE 134*   < > 101*  BUN 14   < > <5*  CREATININE 0.54   < > 0.72  CALCIUM 8.9   < > 8.8*  AST 22  --   --   ALT 15  --   --   ALKPHOS 84  --   --   BILITOT 0.5  --   --    < > = values in this interval not displayed.     Outpatient  follow-up  Follow-up Information    Ronny Bacon, MD. Go on 03/27/2021.   Specialty: General Surgery Why: 9am appointment Contact information: [address] [place] [address] [phone]        McLean-Scocuzza, Nino Glow, MD. Go on 03/23/2021.   Specialty: Internal Medicine Why: 10am appointment Contact information: [address] [place] [address] [phone]        [name], MD. Go on 04/10/2021.   Specialty: Cardiology Why: 10am appoinment  Contact information: [address] [place] [address] [phone]        [name], MD. Schedule an appointment as soon as possible for a visit in 2 weeks.   Specialty: Gastroenterology Contact information: [place] [address] [phone]               30 Day Unplanned Readmission Risk Score   Flowsheet Row ED to Hosp-Admission (Current) from 03/15/2021 in [facility]  30 Day Unplanned Readmission Risk Score (%) 14.93 Filed at 03/20/2021 0801     This score is the patient's risk of an unplanned readmission within 30 days of being discharged (0 -100%). The score is based on dignosis, age, lab data, medications, orders, and past utilization.   Low:  0-14.9   Medium: 15-21.9   High: 22-29.9   Extreme: 30 and above         Management plans discussed with the patient, family and they are in agreement.  CODE STATUS: Partial Code   TOTAL TIME TAKING CARE OF THIS PATIENT: 45 minutes.    Max Sane M.D on 03/20/2021 at 10:51 AM  [facility]   CC: Primary care physician; McLean-Scocuzza, Nino Glow, MD   Note: This dictation was prepared with Dragon dictation along with smaller phrase technology. Any transcriptional errors that result from this process are unintentional.

## 2021-03-21 ENCOUNTER — Telehealth: Payer: Self-pay

## 2021-03-21 NOTE — Telephone Encounter (Signed)
Transition Care Management Unsuccessful Follow-up Telephone Call  Date of discharge and from where:  03/20/21 from [facility]  Attempts:  1st Attempt  Reason for unsuccessful TCM follow-up call:  Unable to reach patient  Hospital follow up scheduled 03/23/21. Will follow.

## 2021-03-21 NOTE — Telephone Encounter (Signed)
Will follow.

## 2021-03-22 NOTE — Telephone Encounter (Signed)
Transition Care Management Follow-up Telephone Call  Date of discharge and from where: 03/20/21 from [facility]  How have you been since you were released from the hospital? Patient states," I am doing well and having very little abd pain. Pain is manageable." Denies n/v/d, fever, headache and all other symptoms. Input/output appropriate and without difficulty.  Any questions or concerns? No  Items Reviewed:  Did the pt receive and understand the discharge instructions provided? Yes   Medications obtained and verified? Yes   Other? No  Any new allergies since your discharge? No   Dietary orders reviewed? Regular per discharge instructions.   Do you have support at home? Lives alone. Brother lives in [place] and good neighbor close by.   Home Care and Equipment/Supplies: Were home health services ordered? No  Functional Questionnaire: (I = Independent and D = Dependent) ADLs: I  Bathing/Dressing- I  Meal Prep- I  Eating- I  Maintaining continence- I  Transferring/Ambulation- Cane as needed  Managing Meds- I  Follow up appointments reviewed:   PCP Hospital f/u appt confirmed? Yes  Scheduled to see Pcp on 03/23/21 @ 10:00.  [facility] f/u appt? Scheduled to see Cardiology, General Surgery. Gastroenterology after appointment with surgeon.  Are transportation arrangements needed? No   If their condition worsens, is the pt aware to call PCP or go to the Emergency Dept.? Yes  Was the patient provided with contact information for the PCP's office or ED? Yes  Was to pt encouraged to call back with questions or concerns? Yes

## 2021-03-23 ENCOUNTER — Ambulatory Visit (INDEPENDENT_AMBULATORY_CARE_PROVIDER_SITE_OTHER): Payer: Medicare Other | Admitting: Internal Medicine

## 2021-03-23 ENCOUNTER — Encounter: Payer: Self-pay | Admitting: Internal Medicine

## 2021-03-23 ENCOUNTER — Other Ambulatory Visit: Payer: Self-pay

## 2021-03-23 VITALS — BP 124/80 | HR 86 | Temp 98.1°F | Ht 59.0 in | Wt 125.0 lb

## 2021-03-23 DIAGNOSIS — I10 Essential (primary) hypertension: Secondary | ICD-10-CM | POA: Diagnosis not present

## 2021-03-23 DIAGNOSIS — I700 Atherosclerosis of aorta: Secondary | ICD-10-CM

## 2021-03-23 DIAGNOSIS — K5792 Diverticulitis of intestine, part unspecified, without perforation or abscess without bleeding: Secondary | ICD-10-CM | POA: Diagnosis not present

## 2021-03-23 NOTE — Patient Instructions (Addendum)
Clarify diet and fiber intake with Dr. Sherol Dade restrictions   Diverticulitis  Diverticulitis is infection or inflammation of small pouches (diverticula) in the colon that form due to a condition called diverticulosis. Diverticula can trap stool (feces) and bacteria, causing infection and inflammation. Diverticulitis may cause severe stomach pain and diarrhea. It may lead to tissue damage in the colon that causes bleeding or blockage. The diverticula may also burst (rupture) and cause infected stool to enter other areas of the abdomen. What are the causes? This condition is caused by stool becoming trapped in the diverticula, which allows bacteria to grow in the diverticula. This leads to inflammation and infection. What increases the risk? You are more likely to develop this condition if you have diverticulosis. The risk increases if you:  Are overweight or obese.  Do not get enough exercise.  Drink alcohol.  Use tobacco products.  Eat a diet that has a lot of red meat such as beef, pork, or lamb.  Eat a diet that does not include enough fiber. High-fiber foods include fruits, vegetables, beans, nuts, and whole grains.  Are over 43 years of age. What are the signs or symptoms? Symptoms of this condition may include:  Pain and tenderness in the abdomen. The pain is normally located on the left side of the abdomen, but it may occur in other areas.  Fever and chills.  Nausea.  Vomiting.  Cramping.  Bloating.  Changes in bowel routines.  Blood in your stool. How is this diagnosed? This condition is diagnosed based on:  Your medical history.  A physical exam.  Tests to make sure there is nothing else causing your condition. These tests may include: ? Blood tests. ? Urine tests. ? CT scan of the abdomen. How is this treated? Most cases of this condition are mild and can be treated at home. Treatment may include:  Taking over-the-counter pain  medicines.  Following a clear liquid diet.  Taking antibiotic medicines by mouth.  Resting. More severe cases may need to be treated at a hospital. Treatment may include:  Not eating or drinking.  Taking prescription pain medicine.  Receiving antibiotic medicines through an IV.  Receiving fluids and nutrition through an IV.  Surgery. When your condition is under control, your health care provider may recommend that you have a colonoscopy. This is an exam to look at the entire large intestine. During the exam, a lubricated, bendable tube is inserted into the anus and then passed into the rectum, colon, and other parts of the large intestine. A colonoscopy can show how severe your diverticula are and whether something else may be causing your symptoms. Follow these instructions at home: Medicines  Take over-the-counter and prescription medicines only as told by your health care provider. These include fiber supplements, probiotics, and stool softeners.  If you were prescribed an antibiotic medicine, take it as told by your health care provider. Do not stop taking the antibiotic even if you start to feel better.  Ask your health care provider if the medicine prescribed to you requires you to avoid driving or using machinery. Eating and drinking  Follow a full liquid diet or another diet as directed by your health care provider.  After your symptoms improve, your health care provider may tell you to change your diet. He or she may recommend that you eat a diet that contains at least 25 grams (25 g) of fiber daily. Fiber makes it easier to pass stool. Healthy sources of fiber include: ?  Berries. One cup contains 4-8 grams of fiber. ? Beans or lentils. One-half cup contains 5-8 grams of fiber. ? Green vegetables. One cup contains 4 grams of fiber.  Avoid eating red meat.   General instructions  Do not use any products that contain nicotine or tobacco, such as cigarettes,  e-cigarettes, and chewing tobacco. If you need help quitting, ask your health care provider.  Exercise for at least 30 minutes, 3 times each week. You should exercise hard enough to raise your heart rate and break a sweat.  Keep all follow-up visits as told by your health care provider. This is important. You may need to have a colonoscopy. Contact a health care provider if:  Your pain does not improve.  Your bowel movements do not return to normal. Get help right away if:  Your pain gets worse.  Your symptoms do not get better with treatment.  Your symptoms suddenly get worse.  You have a fever.  You vomit more than one time.  You have stools that are bloody, black, or tarry. Summary  Diverticulitis is infection or inflammation of small pouches (diverticula) in the colon that form due to a condition called diverticulosis. Diverticula can trap stool (feces) and bacteria, causing infection and inflammation.  You are at higher risk for this condition if you have diverticulosis and you eat a diet that does not include enough fiber.  Most cases of this condition are mild and can be treated at home. More severe cases may need to be treated at a hospital.  When your condition is under control, your health care provider may recommend that you have an exam called a colonoscopy. This exam can show how severe your diverticula are and whether something else may be causing your symptoms.  Keep all follow-up visits as told by your health care provider. This is important. This information is not intended to replace advice given to you by your health care provider. Make sure you discuss any questions you have with your health care provider. Document Revised: 08/23/2019 Document Reviewed: 08/23/2019 Elsevier Patient Education  2021 [employer].

## 2021-03-23 NOTE — Progress Notes (Signed)
Chief Complaint  Patient presents with  . Hospitalization Follow-up   HFU at [facility] 4/21-4/26 for diverticulitis with abscess and sepsis pain was 10/10 or greater LLQ lower ab now 2/10 was given Abx, fluid and diet restriction NPO then clears now on soft diet. Before hosp she was having cramping ab pain with stools and painful stools and presyncope almost 2x due to significant pain she started Abx 10/2020 and has been on them intermittently since for diverticulitis flare currently on augmentin bid started 03/20/21 and will stop in 1 week GI appt 03/26/21 and surgery 03/27/21  She lost 7 lbs after this diverticulitis flare due to lack/inability to eat  Serial CTs ab/pelvis with resolving abscess   htn could not tolerate norvasc 2.5 mg and on micardis 80 mg qd and toprol 50 mg xl BP controlled   Review of Systems  Constitutional: Negative for weight loss.  HENT: Negative for hearing loss.   Eyes: Negative for blurred vision.  Respiratory: Negative for shortness of breath.   Cardiovascular: Negative for chest pain.  Gastrointestinal: Positive for abdominal pain.  Musculoskeletal: Negative for falls and joint pain.  Skin: Negative for rash.  Neurological: Negative for headaches.  Psychiatric/Behavioral: Negative for depression.   Past Medical History:  Diagnosis Date  . Arthritis    neck, back, left knee;   . Breast cancer ([place]) 2009   right lumpectomy s/p radiation x 1 week 2x per day only   . Chicken pox   . Chronic UTI    established with urology   . COVID-19    ? 12/2020 sob   . Diverticular disease    -osis and -itis   . Esophagitis    egd 05/17/14 see report scanned into chart  . History of kidney stones   . Hormone disorder   . Hyperlipidemia   . Hypertension    controlled well with medication;   . Hypothyroidism   . Osteoporosis   . Personal history of radiation therapy 2009   F/U right breast cancer  . Thyroid disease    hypothyroidism    Past Surgical History:   Procedure Laterality Date  . BONE GRAFT HIP ILIAC CREST     + cage left hip 10/23/17   . BREAST BIOPSY Left 2009   clip,benign  . BREAST BIOPSY Right 2009   +  . BREAST LUMPECTOMY Right 2009   2009 lumpectomy   . CARDIAC CATHETERIZATION     No stents; [facility] doesn't recall facility +72yrs ago  . CATARACT EXTRACTION, BILATERAL Bilateral 10/2019  . COLONOSCOPY WITH PROPOFOL N/A 05/15/2020   Procedure: COLONOSCOPY WITH PROPOFOL;  Surgeon: Lin Landsman, MD;  Location: [facility] ENDOSCOPY;  Service: Gastroenterology;  Laterality: N/A;  . EYE SURGERY     b/l cataracts sch 10/2019 in [place]  . HERNIA REPAIR    . INSERTION OF MESH N/A 05/29/2018   Procedure: INSERTION OF MESH;  Surgeon: Johnathan Hausen, MD;  Location: [facility] ORS;  Service: General;  Laterality: N/A;  . JOINT REPLACEMENT    . QUADRICEPS TENDON REPAIR Right 12/05/2020   Procedure: OPEN REPAIR OF RIGHT GLUTEUS MINIMUS TENDON;  Surgeon: Corky Mull, MD;  Location: [facility] ORS;  Service: Orthopedics;  Laterality: Right;  . right gluteus medius/minimus repair Dr. Roland Rack 11/2020      Dr. Roland Rack  . SHOULDER SURGERY Left 2008, 2009   x2 rotator cuff surgeries left shoulders  . SPINAL FUSION  07/24/2020  . SPINE SURGERY  10/23/2017   spinal 09/2017 h/o  scoliosis [facility] L4-S1 OLIF T10 to ililum fusion  . TONSILLECTOMY     age 71 y.o.   . TOTAL SHOULDER REPLACEMENT Right 2012  . TUBAL LIGATION    . VENTRAL HERNIA REPAIR N/A 05/29/2018   Procedure: LAPAROSCOPIC VENTRAL / [place];  Surgeon: Johnathan Hausen, MD;  Location: [facility] ORS;  Service: General;  Laterality: N/A;   Family History  Problem Relation Age of Onset  . Arthritis Mother   . Heart disease Mother 42       stent   . Hyperlipidemia Mother   . Hypertension Mother   . Coronary artery disease Mother 5  . Arthritis Father   . Diabetes Father   . Cancer Father        colon  . AAA (abdominal aortic aneurysm) Father   . Arthritis Sister   . Hyperlipidemia Sister    . Hypertension Sister   . Cancer Brother        lung, smoker  . Mental retardation Brother   . Drug abuse Daughter        overdose in 2018   . Arthritis Sister   . Hyperlipidemia Sister   . Bladder Cancer Neg Hx   . Kidney cancer Neg Hx   . Breast cancer Neg Hx    Social History   Socioeconomic History  . Marital status: Widowed    Spouse name: Not on file  . Number of children: Not on file  . Years of education: Not on file  . Highest education level: Not on file  Occupational History  . Not on file  Tobacco Use  . Smoking status: Never Smoker  . Smokeless tobacco: Never Used  Vaping Use  . Vaping Use: Never used  Substance and Sexual Activity  . Alcohol use: Not Currently    Alcohol/week: 7.0 standard drinks    Types: 7 Glasses of wine per week  . Drug use: No  . Sexual activity: Yes    Birth control/protection: Post-menopausal  Other Topics Concern  . Not on file  Social History Narrative   College grad   widowed husband died 28-Jul-2019    Wears seatbelt and safe in relationship    3 kids       [place]   Social Determinants of Health   Financial Resource Strain: Low Risk   . Difficulty of Paying Living Expenses: Not hard at all  Food Insecurity: No Food Insecurity  . Worried About Charity fundraiser in the Last Year: Never true  . Ran Out of Food in the Last Year: Never true  Transportation Needs: No Transportation Needs  . Lack of Transportation (Medical): No  . Lack of Transportation (Non-Medical): No  Physical Activity: Insufficiently Active  . Days of Exercise per Week: 2 days  . Minutes of Exercise per Session: 60 min  Stress: Not on file  Social Connections: Unknown  . Frequency of Communication with Friends and Family: More than three times a week  . Frequency of Social Gatherings with Friends and Family: Not on file  . Attends Religious Services: Not on file  . Active Member of Clubs or Organizations: Yes  . Attends Theatre manager Meetings: More than 4 times per year  . Marital Status: Widowed  Intimate Partner Violence: Not on file   Current Meds  Medication Sig  . amoxicillin-clavulanate (AUGMENTIN) 875-125 MG tablet Take 1 tablet by mouth 2 (two) times daily for 7 days. With food x 10-14 days  stop cipro and flagyl  . atorvastatin (LIPITOR) 20 MG tablet Take 1 tablet (20 mg total) by mouth every other day. D/c 10 mg pill increased dose  . fluticasone (FLONASE) 50 MCG/ACT nasal spray Place 2 sprays into both nostrils daily. Max b/l nostrils  . levothyroxine (SYNTHROID) 88 MCG tablet Take 1 tablet (88 mcg total) by mouth daily before breakfast. 30 minutes  . metoprolol succinate (TOPROL-XL) 50 MG 24 hr tablet Take 1 tablet (50 mg total) by mouth daily. Take with or immediately following a meal.  . Multiple Vitamin (MULTI-VITAMINS) TABS Take 1 tablet by mouth daily.   Marland Kitchen telmisartan (MICARDIS) 80 MG tablet Take 1 tablet (80 mg total) by mouth daily. In place of losartan 100. Take 1/2 pill day 3 days and if BP >130/>80 take 1 pill day 4 and beyond daily  . vitamin B-12 (CYANOCOBALAMIN) 1000 MCG tablet Take 1,000 mcg by mouth daily.  Marland Kitchen zolpidem (AMBIEN) 5 MG tablet Take 1 tablet (5 mg total) by mouth at bedtime as needed for sleep.   Allergies  Allergen Reactions  . Sulfa Antibiotics Anaphylaxis, Hives, Itching, Shortness Of Breath and Swelling    Lips & throat swelled  . Nitrofuran Derivatives Swelling    Lip swelling; "eyes get red and puffy"  . Amlodipine Besylate Other (See Comments)    Gas, bloating   Gas, bloating  Heart racing   . Nitrofurantoin Hives and Other (See Comments)   Recent Results (from the past 2160 hour(s))  Urinalysis, Complete     Status: Abnormal   Collection Time: 01/18/21 11:15 AM  Result Value Ref Range   Specific Gravity, UA 1.015 1.005 - 1.030   pH, UA 6.5 5.0 - 7.5   Color, UA Yellow Yellow   Appearance Ur Clear Clear   Leukocytes,UA Negative Negative   Protein,UA  Negative Negative/Trace   Glucose, UA Negative Negative   Ketones, UA Negative Negative   RBC, UA Negative Negative   Bilirubin, UA Negative Negative   Urobilinogen, Ur 0.2 0.2 - 1.0 mg/dL   Nitrite, UA Positive (A) Negative   Microscopic Examination See below:   Microscopic Examination     Status: Abnormal   Collection Time: 01/18/21 11:15 AM   Urine  Result Value Ref Range   WBC, UA 6-10 (A) 0 - 5 /hpf   RBC 0-2 0 - 2 /hpf   Epithelial Cells (non renal) 0-10 0 - 10 /hpf   Renal Epithel, UA 0-10 (A) None seen /hpf   Bacteria, UA Few None seen/Few  CULTURE, URINE COMPREHENSIVE     Status: None   Collection Time: 01/18/21  1:26 PM   Specimen: Urine   UR  Result Value Ref Range   Urine Culture, Comprehensive Final report    Organism ID, Bacteria Comment     Comment: Mixed urogenital flora 10,000-25,000 colony forming units per mL   Comprehensive metabolic panel     Status: Abnormal   Collection Time: 02/06/21 10:19 AM  Result Value Ref Range   Sodium 141 135 - 145 mEq/L   Potassium 3.9 3.5 - 5.1 mEq/L   Chloride 104 96 - 112 mEq/L   CO2 28 19 - 32 mEq/L   Glucose, Bld 105 (H) 70 - 99 mg/dL   BUN 24 (H) 6 - 23 mg/dL   Creatinine, Ser 0.54 0.40 - 1.20 mg/dL   Total Bilirubin 0.4 0.2 - 1.2 mg/dL   Alkaline Phosphatase 80 39 - 117 U/L   AST 17 0 - 37  U/L   ALT 12 0 - 35 U/L   Total Protein 6.9 6.0 - 8.3 g/dL   Albumin 4.1 3.5 - 5.2 g/dL   GFR 90.84 >60.00 mL/min    Comment: Calculated using the CKD-EPI Creatinine Equation (2021)   Calcium 9.6 8.4 - 10.5 mg/dL  Lipid panel     Status: Abnormal   Collection Time: 02/06/21 10:19 AM  Result Value Ref Range   Cholesterol 201 (H) 0 - 200 mg/dL    Comment: ATP III Classification       Desirable:  < 200 mg/dL               Borderline High:  200 - 239 mg/dL          High:  > = 240 mg/dL   Triglycerides 119.0 0.0 - 149.0 mg/dL    Comment: Normal:  <150 mg/dLBorderline High:  150 - 199 mg/dL   HDL 68.30 >39.00 mg/dL   VLDL 23.8  0.0 - 40.0 mg/dL   LDL Cholesterol 109 (H) 0 - 99 mg/dL   Total CHOL/HDL Ratio 3     Comment:                Men          Women1/2 Average Risk     3.4          3.3Average Risk          5.0          4.42X Average Risk          9.6          7.13X Average Risk          15.0          11.0                       NonHDL 132.60     Comment: NOTE:  Non-HDL goal should be 30 mg/dL higher than patient's LDL goal (i.e. LDL goal of < 70 mg/dL, would have non-HDL goal of < 100 mg/dL)  CBC with Differential/Platelet     Status: None   Collection Time: 02/06/21 10:19 AM  Result Value Ref Range   WBC 7.2 4.0 - 10.5 K/uL   RBC 4.59 3.87 - 5.11 Mil/uL   Hemoglobin 12.8 12.0 - 15.0 g/dL   HCT 39.4 36.0 - 46.0 %   MCV 85.9 78.0 - 100.0 fl   MCHC 32.4 30.0 - 36.0 g/dL   RDW 15.1 11.5 - 15.5 %   Platelets 288.0 150.0 - 400.0 K/uL   Neutrophils Relative % 76.3 43.0 - 77.0 %   Lymphocytes Relative 14.1 12.0 - 46.0 %   Monocytes Relative 5.4 3.0 - 12.0 %   Eosinophils Relative 3.3 0.0 - 5.0 %   Basophils Relative 0.9 0.0 - 3.0 %   Neutro Abs 5.5 1.4 - 7.7 K/uL   Lymphs Abs 1.0 0.7 - 4.0 K/uL   Monocytes Absolute 0.4 0.1 - 1.0 K/uL   Eosinophils Absolute 0.2 0.0 - 0.7 K/uL   Basophils Absolute 0.1 0.0 - 0.1 K/uL  Hemoglobin A1c     Status: None   Collection Time: 02/06/21 10:19 AM  Result Value Ref Range   Hgb A1c MFr Bld 5.7 4.6 - 6.5 %    Comment: Glycemic Control Guidelines for People with Diabetes:Non Diabetic:  <6%Goal of Therapy: <7%Additional Action Suggested:  >8%   TSH     Status: None   Collection  Time: 02/06/21 10:19 AM  Result Value Ref Range   TSH 1.58 0.35 - 4.50 uIU/mL  Vitamin D (25 hydroxy)     Status: Abnormal   Collection Time: 02/06/21 10:19 AM  Result Value Ref Range   VITD 113.74 (HH) 30.00 - 100.00 ng/mL  ECHOCARDIOGRAM COMPLETE     Status: None   Collection Time: 03/09/21  1:19 PM  Result Value Ref Range   AR max vel 2.50 cm2   AV Peak grad 6.5 mmHg   Ao pk vel 1.27 m/s    S' Lateral 2.90 cm   Area-P 1/2 2.62 cm2   AV Area VTI 2.20 cm2   AV Mean grad 4.0 mmHg   Single Plane A4C EF 55.5 %   Single Plane A2C EF 55.6 %   Calc EF 56.2 %   AV Area mean vel 2.38 cm2  Lipase, blood     Status: None   Collection Time: 03/15/21  9:29 AM  Result Value Ref Range   Lipase 26 11 - 51 U/L    Comment: Performed at [facility], [address] [place], [place] [address]  Comprehensive metabolic panel     Status: Abnormal   Collection Time: 03/15/21  9:29 AM  Result Value Ref Range   Sodium 137 135 - 145 mmol/L   Potassium 3.5 3.5 - 5.1 mmol/L   Chloride 103 98 - 111 mmol/L   CO2 24 22 - 32 mmol/L   Glucose, Bld 134 (H) 70 - 99 mg/dL    Comment: Glucose reference range applies only to samples taken after fasting for at least 8 hours.   BUN 14 8 - 23 mg/dL   Creatinine, Ser 0.54 0.44 - 1.00 mg/dL   Calcium 8.9 8.9 - 10.3 mg/dL   Total Protein 7.9 6.5 - 8.1 g/dL   Albumin 3.7 3.5 - 5.0 g/dL   AST 22 15 - 41 U/L   ALT 15 0 - 44 U/L   Alkaline Phosphatase 84 38 - 126 U/L   Total Bilirubin 0.5 0.3 - 1.2 mg/dL   GFR, Estimated >60 >60 mL/min    Comment: (NOTE) Calculated using the CKD-EPI Creatinine Equation (2021)    Anion gap 10 5 - 15    Comment: Performed at [facility], [address] [place], [place] [address]  CBC     Status: Abnormal   Collection Time: 03/15/21  9:29 AM  Result Value Ref Range   WBC 14.4 (H) 4.0 - 10.5 K/uL   RBC 5.04 3.87 - 5.11 MIL/uL   Hemoglobin 13.5 12.0 - 15.0 g/dL   HCT 41.6 36.0 - 46.0 %   MCV 82.5 80.0 - 100.0 fL   MCH 26.8 26.0 - 34.0 pg   MCHC 32.5 30.0 - 36.0 g/dL   RDW 14.9 11.5 - 15.5 %   Platelets 288 150 - 400 K/uL   nRBC 0.0 0.0 - 0.2 %    Comment: Performed at [facility], [address] [place], [place] [address]  Brain natriuretic peptide     Status: None   Collection Time: 03/15/21  9:29 AM  Result Value Ref Range   B Natriuretic Peptide 39.3 0.0 - 100.0 pg/mL    Comment:  Performed at [facility], [address] [place], [place] [address]  Procalcitonin     Status: None   Collection Time: 03/15/21  9:29 AM  Result Value Ref Range   Procalcitonin <0.10 ng/mL    Comment:  Interpretation: PCT (Procalcitonin) <= 0.5 ng/mL: Systemic infection (sepsis) is not likely. Local bacterial infection is possible. (NOTE)       Sepsis PCT Algorithm           Lower Respiratory Tract                                      Infection PCT Algorithm    ----------------------------     ----------------------------         PCT < 0.25 ng/mL                PCT < 0.10 ng/mL          Strongly encourage             Strongly discourage   discontinuation of antibiotics    initiation of antibiotics    ----------------------------     -----------------------------       PCT 0.25 - 0.50 ng/mL            PCT 0.10 - 0.25 ng/mL               OR       >80% decrease in PCT            Discourage initiation of                                            antibiotics      Encourage discontinuation           of antibiotics    ----------------------------     -----------------------------         PCT >= 0.50 ng/mL              PCT 0.26 - 0.50 ng/mL               AND        <80% decrease in PCT             Encourage initiation of                                             antibiotics       Encourage continuation           of antibiotics    ----------------------------     -----------------------------        PCT >= 0.50 ng/mL                  PCT > 0.50 ng/mL               AND         increase in PCT                  Strongly encourage                                      initiation of antibiotics    Strongly encourage escalation           of antibiotics                                     -----------------------------  PCT <= 0.25 ng/mL                                                 OR                                        > 80%  decrease in PCT                                      Discontinue / Do not initiate                                             antibiotics  Performed at [facility], [address] [place], [place] [address]   Urinalysis, Complete w Microscopic Urine, Clean Catch     Status: Abnormal   Collection Time: 03/15/21 11:09 AM  Result Value Ref Range   Color, Urine YELLOW (A) YELLOW   APPearance CLEAR (A) CLEAR   Specific Gravity, Urine 1.045 (H) 1.005 - 1.030   pH 6.0 5.0 - 8.0   Glucose, UA NEGATIVE NEGATIVE mg/dL   Hgb urine dipstick NEGATIVE NEGATIVE   Bilirubin Urine NEGATIVE NEGATIVE   Ketones, ur NEGATIVE NEGATIVE mg/dL   Protein, ur NEGATIVE NEGATIVE mg/dL   Nitrite NEGATIVE NEGATIVE   Leukocytes,Ua NEGATIVE NEGATIVE   RBC / HPF 0-5 0 - 5 RBC/hpf   WBC, UA 0-5 0 - 5 WBC/hpf   Bacteria, UA NONE SEEN NONE SEEN   Squamous Epithelial / LPF NONE SEEN 0 - 5   Mucus PRESENT     Comment: Performed at [facility], [address] [place], [place] [address]  Lactic acid, plasma     Status: None   Collection Time: 03/15/21 11:54 AM  Result Value Ref Range   Lactic Acid, Venous 0.7 0.5 - 1.9 mmol/L    Comment: Performed at [facility], [address] [place], [place] [address]  Blood culture (routine x 2)     Status: None   Collection Time: 03/15/21 11:54 AM   Specimen: BLOOD  Result Value Ref Range   Specimen Description BLOOD BLOOD RIGHT FOREARM    Special Requests      BOTTLES DRAWN AEROBIC AND ANAEROBIC Blood Culture adequate volume   Culture      NO GROWTH 5 DAYS Performed at [facility], [address] [place], [place] [address]    Report Status 03/20/2021 FINAL   Blood culture (routine x 2)     Status: None   Collection Time: 03/15/21 11:54 AM   Specimen: BLOOD  Result Value Ref Range   Specimen Description BLOOD RIGHT ANTECUBITAL    Special Requests      BOTTLES DRAWN AEROBIC AND ANAEROBIC Blood Culture adequate volume    Culture      NO GROWTH 5 DAYS Performed at [facility], [address] [place],  [address]    Report Status 03/20/2021 FINAL   Resp Panel by RT-PCR (Flu A&B, Covid) Nasopharyngeal Swab     Status: None   Collection Time:  03/15/21 12:54 PM   Specimen: Nasopharyngeal Swab; Nasopharyngeal(NP) swabs in vial transport medium  Result Value Ref Range   SARS Coronavirus 2 by RT PCR NEGATIVE NEGATIVE    Comment: (NOTE) SARS-CoV-2 target nucleic acids are NOT DETECTED.  The SARS-CoV-2 RNA is generally detectable in upper respiratory specimens during the acute phase of infection. The lowest concentration of SARS-CoV-2 viral copies this assay can detect is 138 copies/mL. A negative result does not preclude SARS-Cov-2 infection and should not be used as the sole basis for treatment or other patient management decisions. A negative result may occur with  improper specimen collection/handling, submission of specimen other than nasopharyngeal swab, presence of viral mutation(s) within the areas targeted by this assay, and inadequate number of viral copies(<138 copies/mL). A negative result must be combined with clinical observations, patient history, and epidemiological information. The expected result is Negative.  Fact Sheet for Patients:  EntrepreneurPulse.com.au  Fact Sheet for Healthcare Providers:  IncredibleEmployment.be  This test is no t yet approved or cleared by the [place] FDA and  has been authorized for detection and/or diagnosis of SARS-CoV-2 by FDA under an Emergency Use Authorization (EUA). This EUA will remain  in effect (meaning this test can be used) for the duration of the COVID-19 declaration under Section 564(b)(1) of the Act, 21 U.S.C.section 360bbb-3(b)(1), unless the authorization is terminated  or revoked sooner.       Influenza A by PCR NEGATIVE NEGATIVE   Influenza B by PCR NEGATIVE NEGATIVE     Comment: (NOTE) The Xpert Xpress SARS-CoV-2/FLU/RSV plus assay is intended as an aid in the diagnosis of influenza from Nasopharyngeal swab specimens and should not be used as a sole basis for treatment. Nasal washings and aspirates are unacceptable for Xpert Xpress SARS-CoV-2/FLU/RSV testing.  Fact Sheet for Patients: EntrepreneurPulse.com.au  Fact Sheet for Healthcare Providers: IncredibleEmployment.be  This test is not yet approved or cleared by the [place] FDA and has been authorized for detection and/or diagnosis of SARS-CoV-2 by FDA under an Emergency Use Authorization (EUA). This EUA will remain in effect (meaning this test can be used) for the duration of the COVID-19 declaration under Section 564(b)(1) of the Act, 21 U.S.C. section 360bbb-3(b)(1), unless the authorization is terminated or revoked.  Performed at [facility], [address] [place], [place] [address]   Protime-INR     Status: None   Collection Time: 03/15/21  3:11 PM  Result Value Ref Range   Prothrombin Time 13.5 11.4 - 15.2 seconds   INR 1.0 0.8 - 1.2    Comment: (NOTE) INR goal varies based on device and disease states. Performed at [facility], [address] [place], [place] [address]   APTT     Status: None   Collection Time: 03/15/21  3:11 PM  Result Value Ref Range   aPTT 28 24 - 36 seconds    Comment: Performed at [facility], [address] [place], [place] [id number]  Basic metabolic panel     Status: Abnormal   Collection Time: 03/16/21  4:34 AM  Result Value Ref Range   Sodium 139 135 - 145 mmol/L   Potassium 4.0 3.5 - 5.1 mmol/L   Chloride 106 98 - 111 mmol/L   CO2 25 22 - 32 mmol/L   Glucose, Bld 86 70 - 99 mg/dL    Comment: Glucose reference range applies only to samples taken after fasting for at least 8 hours.   BUN 9 8 - 23 mg/dL  Creatinine, Ser 0.58 0.44 - 1.00 mg/dL   Calcium 8.1 (L) 8.9 - 10.3  mg/dL   GFR, Estimated >13 >88 mL/min    Comment: (NOTE) Calculated using the CKD-EPI Creatinine Equation (2021)    Anion gap 8 5 - 15    Comment: Performed at [facility], [address] [place], [place] [address]  CBC     Status: Abnormal   Collection Time: 03/16/21  4:34 AM  Result Value Ref Range   WBC 8.2 4.0 - 10.5 K/uL   RBC 3.98 3.87 - 5.11 MIL/uL   Hemoglobin 10.7 (L) 12.0 - 15.0 g/dL   HCT 74.7 (L) 18.5 - 50.1 %   MCV 84.4 80.0 - 100.0 fL   MCH 26.9 26.0 - 34.0 pg   MCHC 31.8 30.0 - 36.0 g/dL   RDW 58.6 82.5 - 74.9 %   Platelets 236 150 - 400 K/uL   nRBC 0.0 0.0 - 0.2 %    Comment: Performed at [facility], [address] [place], [place] [address]  Glucose, capillary     Status: None   Collection Time: 03/16/21  8:17 AM  Result Value Ref Range   Glucose-Capillary 79 70 - 99 mg/dL    Comment: Glucose reference range applies only to samples taken after fasting for at least 8 hours.  CBC     Status: Abnormal   Collection Time: 03/17/21  4:28 AM  Result Value Ref Range   WBC 6.7 4.0 - 10.5 K/uL   RBC 4.14 3.87 - 5.11 MIL/uL   Hemoglobin 11.2 (L) 12.0 - 15.0 g/dL   HCT 74.7 (L) 15.9 - 53.9 %   MCV 84.1 80.0 - 100.0 fL   MCH 27.1 26.0 - 34.0 pg   MCHC 32.2 30.0 - 36.0 g/dL   RDW 67.2 89.7 - 91.5 %   Platelets 249 150 - 400 K/uL   nRBC 0.0 0.0 - 0.2 %    Comment: Performed at [facility], [address], [place], [place] [address]  Basic metabolic panel     Status: Abnormal   Collection Time: 03/17/21  4:28 AM  Result Value Ref Range   Sodium 138 135 - 145 mmol/L   Potassium 3.9 3.5 - 5.1 mmol/L   Chloride 104 98 - 111 mmol/L   CO2 27 22 - 32 mmol/L   Glucose, Bld 108 (H) 70 - 99 mg/dL    Comment: Glucose reference range applies only to samples taken after fasting for at least 8 hours.   BUN 7 (L) 8 - 23 mg/dL   Creatinine, Ser 4.38 0.44 - 1.00 mg/dL   Calcium 8.6 (L) 8.9 - 10.3 mg/dL   GFR, Estimated >37 >79 mL/min     Comment: (NOTE) Calculated using the CKD-EPI Creatinine Equation (2021)    Anion gap 7 5 - 15    Comment: Performed at [facility], [address] [place], [place] [address]  Glucose, capillary     Status: Abnormal   Collection Time: 03/17/21  7:56 AM  Result Value Ref Range   Glucose-Capillary 118 (H) 70 - 99 mg/dL    Comment: Glucose reference range applies only to samples taken after fasting for at least 8 hours.   Comment 1 Notify RN    Comment 2 Document in Chart   CBC     Status: Abnormal   Collection Time: 03/18/21  5:01 AM  Result Value Ref Range   WBC 5.6 4.0 - 10.5 K/uL  RBC 4.35 3.87 - 5.11 MIL/uL   Hemoglobin 11.6 (L) 12.0 - 15.0 g/dL   HCT 34.6 (L) 36.0 - 46.0 %   MCV 79.5 (L) 80.0 - 100.0 fL   MCH 26.7 26.0 - 34.0 pg   MCHC 33.5 30.0 - 36.0 g/dL   RDW 14.8 11.5 - 15.5 %   Platelets 272 150 - 400 K/uL   nRBC 0.0 0.0 - 0.2 %    Comment: Performed at [facility], [address] [place], [place] [address]  Glucose, capillary     Status: Abnormal   Collection Time: 03/18/21  8:06 AM  Result Value Ref Range   Glucose-Capillary 106 (H) 70 - 99 mg/dL    Comment: Glucose reference range applies only to samples taken after fasting for at least 8 hours.  CBC     Status: None   Collection Time: 03/19/21  5:05 AM  Result Value Ref Range   WBC 5.5 4.0 - 10.5 K/uL   RBC 4.61 3.87 - 5.11 MIL/uL   Hemoglobin 12.3 12.0 - 15.0 g/dL   HCT 37.6 36.0 - 46.0 %   MCV 81.6 80.0 - 100.0 fL   MCH 26.7 26.0 - 34.0 pg   MCHC 32.7 30.0 - 36.0 g/dL   RDW 14.8 11.5 - 15.5 %   Platelets 287 150 - 400 K/uL   nRBC 0.0 0.0 - 0.2 %    Comment: Performed at [facility], [address] [place], [place] [id number]  Basic metabolic panel     Status: Abnormal   Collection Time: 03/19/21  5:05 AM  Result Value Ref Range   Sodium 138 135 - 145 mmol/L   Potassium 3.8 3.5 - 5.1 mmol/L   Chloride 102 98 - 111 mmol/L   CO2 28 22 - 32 mmol/L   Glucose, Bld 101 (H)  70 - 99 mg/dL    Comment: Glucose reference range applies only to samples taken after fasting for at least 8 hours.   BUN <5 (L) 8 - 23 mg/dL   Creatinine, Ser 0.72 0.44 - 1.00 mg/dL   Calcium 8.8 (L) 8.9 - 10.3 mg/dL   GFR, Estimated >60 >60 mL/min    Comment: (NOTE) Calculated using the CKD-EPI Creatinine Equation (2021)    Anion gap 8 5 - 15    Comment: Performed at [facility], [address] [place], [place] [address]  Glucose, capillary     Status: Abnormal   Collection Time: 03/19/21  8:22 AM  Result Value Ref Range   Glucose-Capillary 123 (H) 70 - 99 mg/dL    Comment: Glucose reference range applies only to samples taken after fasting for at least 8 hours.   Comment 1 Notify RN   Glucose, capillary     Status: Abnormal   Collection Time: 03/20/21  7:29 AM  Result Value Ref Range   Glucose-Capillary 100 (H) 70 - 99 mg/dL    Comment: Glucose reference range applies only to samples taken after fasting for at least 8 hours.   Comment 1 Notify RN    Objective  Body mass index is 25.25 kg/m. Wt Readings from Last 3 Encounters:  03/23/21 125 lb (56.7 kg)  03/15/21 128 lb (58.1 kg)  02/20/21 132 lb 6.4 oz (60.1 kg)   Temp Readings from Last 3 Encounters:  03/23/21 98.1 F (36.7 C) (Oral)  03/20/21 97.9 F (36.6 C) (Oral)  02/20/21 (!) 97.5 F (36.4 C)   BP Readings from Last 3 Encounters:  03/23/21 124/80  03/20/21 130/72  02/20/21 (!) 164/90   Pulse Readings from Last 3 Encounters:  03/23/21 86  03/20/21 76  02/20/21 (!) 111    Physical Exam Vitals and nursing note reviewed.  Constitutional:      Appearance: Normal appearance. She is well-developed and well-groomed.  HENT:     Head: Normocephalic and atraumatic.  Cardiovascular:     Rate and Rhythm: Normal rate and regular rhythm.     Heart sounds: Normal heart sounds. No murmur heard.   Pulmonary:     Effort: Pulmonary effort is normal.     Breath sounds: Normal breath sounds.  Abdominal:      Tenderness: There is abdominal tenderness in the right lower quadrant, suprapubic area and left lower quadrant.    Skin:    General: Skin is warm and dry.  Neurological:     General: No focal deficit present.     Mental Status: She is alert and oriented to person, place, and time. Mental status is at baseline.     Gait: Gait normal.  Psychiatric:        Mood and Affect: Mood normal.        Behavior: Behavior normal. Behavior is cooperative.        Thought Content: Thought content normal.        Judgment: Judgment normal.     Assessment  Plan  Diverticulitis Improved ab pain  Will complete augmentin bid 03/27/21  F/u GI 03/26/21 Dr. Bailey Mech and 03/27/21 Dr. Ronny Bacon Pt wants to disc dietary restrictions and fiber rec and really for now does not want surgery   htn  Monitor bp on toprol 50 mg qd xl and micardis 80 mg qd  Unable to tolerate norvasc she states due to palpitations  Echo reviewed 03/09/21 normal EF some DD but no significant changes   HM Had flu shotutd prevnar had2/4/19, pna 23 10/10/18 Tdaphad 12/09/11 Declinesshingrix, hep B vaccineprev. zostervax had 2012  covid 19 vx pfizer3/3 Never smoker Consider shingrix vaccine given Rx 07/11/20  s/p right breast lumpectomy 2009 and radiation only  -mammogram7/6/20 negative, 06/05/20 negative mammo ordered 05/2021  Pap out of age window no h/o abnormal pap  DEXAhad 6/4/14h/o osteopenia, +osteoporosis 12/27/19 06/02/20 T -3.0, -3.2, -3.7 Could not tolerate forteohadreclast influsion12/30/21or 11/24/2020 f/u in 1 year 2nd infusion[facility] endocrine  Colonoscopy copy obtained reviewed 01/10/09 grade 1 IH and diverticulosis -colonoscopy due at f/uwill hold for now with other multiple appts 05/15/20 tubular adenoma FH father colon cancer dx'ed 40 y.o Q 5 years Now est Dr. [name]  rec healthy diet and exercise Provider: Dr. Olivia Mackie McLean-Scocuzza-Internal Medicine

## 2021-03-26 ENCOUNTER — Encounter: Payer: Self-pay | Admitting: Gastroenterology

## 2021-03-26 ENCOUNTER — Ambulatory Visit (INDEPENDENT_AMBULATORY_CARE_PROVIDER_SITE_OTHER): Payer: Medicare Other | Admitting: Gastroenterology

## 2021-03-26 ENCOUNTER — Other Ambulatory Visit: Payer: Self-pay

## 2021-03-26 VITALS — BP 132/82 | HR 92 | Ht 59.0 in | Wt 123.6 lb

## 2021-03-26 DIAGNOSIS — K5733 Diverticulitis of large intestine without perforation or abscess with bleeding: Secondary | ICD-10-CM

## 2021-03-26 NOTE — Progress Notes (Signed)
Wyline Mood MD, MRCP(U.K) [address]  [address]  [place], [place] [address]  Main: [phone]  Fax: [phone]   Primary Care Physician: McLean-Scocuzza, Pasty Spillers, MD  Primary Gastroenterologist:  Dr. Wyline Mood   Diverticulitis hospital follow up   HPI: Maria Spencer is a 75 y.o. female    Summary of history :  She is a patient who has seen Dr. Allegra Lai last about a year back for chronic left lower quadrant pain.  Has a diagnosis of irritable bowel syndrome.  Prior history of diverticulitis in March 2021, esophagitis in 2015 at that time she was treated with ciprofloxacin and Flagyl for 2 weeks to treat the diverticulitis.  And she underwent a colonoscopy in June 2021 4 mm polyp was resected in the transverse colon.  Hemorrhoids were noted and sigmoid diverticulosis was noted.  The polyp was a tubular adenoma.  She was seen again in December 2021 developed some constipation and was commenced on MiraLAX  02/06/2021 hemoglobin 12.8 g normal TSH.  B12 was 260 and folate was normal..  Commenced on antibiotics in March 2022 for diverticulitis  Interval history    Called in the third week of April 2022 with features suggestive of diverticulitis.  I commenced her on oral antibiotics.  The pain and discomfort got worse.  After going to the hospital.  Had a CT scan of the abdomen which showed sigmoid colonic diverticulitis with small abscesses.  Was treated conservatively with antibiotics.  And repeat CT scan showed improvement.  Surgeon suggested possible outpatient elective sigmoid colectomy.  Given Augmentin at home for total of 14 days.  Since colonoscopy was done within the past 1 year there is no indication to repeat a further colonoscopy  Doing well since discharge no other abdominal pain.  Normal bowel movements.    Current Outpatient Medications  Medication Sig Dispense Refill  . amoxicillin-clavulanate (AUGMENTIN) 875-125 MG tablet Take 1 tablet by mouth 2 (two) times  daily for 7 days. With food x 10-14 days stop cipro and flagyl 14 tablet 0  . atorvastatin (LIPITOR) 20 MG tablet Take 1 tablet (20 mg total) by mouth every other day. D/c 10 mg pill increased dose 90 tablet 3  . fluticasone (FLONASE) 50 MCG/ACT nasal spray Place 2 sprays into both nostrils daily. Max b/l nostrils 48 g 4  . levothyroxine (SYNTHROID) 88 MCG tablet Take 1 tablet (88 mcg total) by mouth daily before breakfast. 30 minutes 90 tablet 3  . metoprolol succinate (TOPROL-XL) 50 MG 24 hr tablet Take 1 tablet (50 mg total) by mouth daily. Take with or immediately following a meal. 90 tablet 3  . Multiple Vitamin (MULTI-VITAMINS) TABS Take 1 tablet by mouth daily.     Marland Kitchen telmisartan (MICARDIS) 80 MG tablet Take 1 tablet (80 mg total) by mouth daily. In place of losartan 100. Take 1/2 pill day 3 days and if BP >130/>80 take 1 pill day 4 and beyond daily 90 tablet 3  . vitamin B-12 (CYANOCOBALAMIN) 1000 MCG tablet Take 1,000 mcg by mouth daily.    Marland Kitchen zolpidem (AMBIEN) 5 MG tablet Take 1 tablet (5 mg total) by mouth at bedtime as needed for sleep. 90 tablet 1  . Calcium Carb-Cholecalciferol ([employer] CALCIUM 600 & VITAMIN D3 PO) Take 1 tablet by mouth daily. (Patient not taking: No sig reported)    . Potassium 99 MG TABS Take 1 tablet by mouth daily. (Patient not taking: No sig reported)     No current facility-administered  medications for this visit.    Allergies as of 03/26/2021 - Review Complete 03/26/2021  Allergen Reaction Noted  . Sulfa antibiotics Anaphylaxis, Hives, Itching, Shortness Of Breath, and Swelling 06/13/2015  . Nitrofuran derivatives Swelling 01/05/2020  . Amlodipine besylate Other (See Comments) 09/10/2019  . Nitrofurantoin Hives and Other (See Comments) 06/13/2011    ROS:  General: Negative for anorexia, weight loss, fever, chills, fatigue, weakness. ENT: Negative for hoarseness, difficulty swallowing , nasal congestion. CV: Negative for chest pain, angina, palpitations,  dyspnea on exertion, peripheral edema.  Respiratory: Negative for dyspnea at rest, dyspnea on exertion, cough, sputum, wheezing.  GI: See history of present illness. GU:  Negative for dysuria, hematuria, urinary incontinence, urinary frequency, nocturnal urination.  Endo: Negative for unusual weight change.    Physical Examination:   BP 132/82 (BP Location: Left Arm, Patient Position: Sitting, Cuff Size: Normal)   Pulse 92   Ht 4\' 11"  (1.499 m)   Wt 123 lb 9.6 oz (56.1 kg)   BMI 24.96 kg/m   General: Well-nourished, well-developed in no acute distress.  Eyes: No icterus. Conjunctivae pink. Mouth: Oropharyngeal mucosa moist and pink , no lesions erythema or exudate. Abdomen: Bowel sounds are normal, nontender, nondistended, no hepatosplenomegaly or masses, no abdominal bruits or hernia , no rebound or guarding.   Extremities: No lower extremity edema. No clubbing or deformities. Neuro: Alert and oriented x 3.  Grossly intact. Skin: Warm and dry, no jaundice.   Psych: Alert and cooperative, normal mood and affect.   Imaging Studies: CT ABDOMEN PELVIS W CONTRAST  Result Date: 03/19/2021 CLINICAL DATA:  Diverticulitis. EXAM: CT ABDOMEN AND PELVIS WITH CONTRAST TECHNIQUE: Multidetector CT imaging of the abdomen and pelvis was performed using the standard protocol following bolus administration of intravenous contrast. CONTRAST:  120mL OMNIPAQUE IOHEXOL 300 MG/ML  SOLN COMPARISON:  March 15, 2021. FINDINGS: Lower chest: No acute abnormality. Hepatobiliary: No focal liver abnormality is seen. No gallstones, gallbladder wall thickening, or biliary dilatation. Pancreas: Unremarkable. No pancreatic ductal dilatation or surrounding inflammatory changes. Spleen: Normal in size without focal abnormality. Adrenals/Urinary Tract: Adrenal glands are unremarkable. Kidneys are normal, without renal calculi, focal lesion, or hydronephrosis. Bladder is unremarkable. Stomach/Bowel: Stomach appears normal.  There is no evidence of bowel obstruction. Sigmoid diverticulitis noted on prior exam appears to be mildly improved. The abscesses noted on prior exam appear to have nearly resolved. Vascular/Lymphatic: Aortic atherosclerosis. No enlarged abdominal or pelvic lymph nodes. Reproductive: Uterus and bilateral adnexa are unremarkable. Other: No abdominal wall hernia or abnormality. No abdominopelvic ascites. Musculoskeletal: Postsurgical changes are seen involving the visualized thoracic and lumbar spine and sacrum. No acute abnormality is noted. IMPRESSION: Sigmoid diverticulitis noted on prior exam appears to be mildly improved currently. The abscesses noted on prior exam appear to have nearly resolved. Aortic Atherosclerosis (ICD10-I70.0). Electronically Signed   By: Marijo Conception M.D.   On: 03/19/2021 11:59   CT ABDOMEN PELVIS W CONTRAST  Result Date: 03/15/2021 CLINICAL DATA:  Left lower quadrant abdominal pain Saturday, nausea, diaphoresis at; history of breast cancer post lumpectomy and radiation therapy, kidney stones, hypertension, diverticulitis EXAM: CT ABDOMEN AND PELVIS WITH CONTRAST TECHNIQUE: Multidetector CT imaging of the abdomen and pelvis was performed using the standard protocol following bolus administration of intravenous contrast. Sagittal and coronal MPR images reconstructed from axial data set. CONTRAST:  49mL OMNIPAQUE IOHEXOL 300 MG/ML SOLN IV. No oral contrast. COMPARISON:  02/09/2021 FINDINGS: Lower chest: Lung bases clear Hepatobiliary: Tiny cyst LEFT lobe liver. Gallbladder and  liver otherwise normal appearance Pancreas: Normal appearance Spleen: Normal appearance. Small splenule medial to spleen 16 x 13 mm. Adrenals/Urinary Tract: Adrenal glands, kidneys, ureters, and bladder normal appearance Stomach/Bowel: Normal appendix. Duodenal diverticulum. Stomach and small bowel loops unremarkable. Diverticulosis of descending and sigmoid colon with wall thickening and mild pericolic  inflammatory changes at sigmoid colon consistent with diverticulitis. Gas and fluid collection identified adjacent to sigmoid colon 3.4 x 2.5 x 3.2 cm likely small diverticular abscess. Additional small fluid collection adjacent to more distal sigmoid colon, 1.6 x 1.5 x 1.7 cm likely second small pericolic abscess. Associated stranding and sigmoid mesocolon. Remainder of colon unremarkable. Vascular/Lymphatic: Atherosclerotic calcifications aorta without aneurysm. Vascular structures patent. No adenopathy. Reproductive: Unremarkable uterus and adnexa Other: Minimal free fluid in pelvis. No free air. Small umbilical hernia containing fat. Musculoskeletal: Prior thoracolumbosacral fusion. Bones demineralized. IMPRESSION: Acute sigmoid diverticulitis with 2 small pericolic abscess collections of 3.4 cm and 1.7 cm. Small umbilical hernia containing fat. Aortic Atherosclerosis (ICD10-I70.0). Electronically Signed   By: Lavonia Dana M.D.   On: 03/15/2021 10:59   ECHOCARDIOGRAM COMPLETE  Result Date: 03/09/2021    ECHOCARDIOGRAM REPORT   Patient Name:   Maria Spencer Date of Exam: 03/09/2021 Medical Rec #:  [id number]      Height:       59.0 in Accession #:    [id number]     Weight:       132.4 lb Date of Birth:  07-28-1946     BSA:          1.548 m Patient Age:    39 years       BP:           152/88 mmHg Patient Gender: F              HR:           84 bpm. Exam Location:  [facility] Procedure: 2D Echo, Cardiac Doppler and Color Doppler Indications:    R06.02 SOB; I51.7 Cardiomegaly  History:        Patient has prior history of Echocardiogram examinations.                 Cardiomegaly; Signs/Symptoms:Shortness of Breath.  Sonographer:    Pilar Jarvis RDMS, RVT, RDCS Referring Phys: [id number] [place]  1. Left ventricular ejection fraction, by estimation, is 55 to 60%. The left ventricle has normal function. The left ventricle has no regional wall motion abnormalities. There is mild left ventricular  hypertrophy. Left ventricular diastolic parameters are consistent with Grade I diastolic dysfunction (impaired relaxation).  2. Right ventricular systolic function is normal. The right ventricular size is normal.  3. The mitral valve is normal in structure. Mild mitral valve regurgitation.  4. The aortic valve is tricuspid. Aortic valve regurgitation is not visualized.  5. The inferior vena cava is normal in size with greater than 50% respiratory variability, suggesting right atrial pressure of 3 mmHg. FINDINGS  Left Ventricle: Left ventricular ejection fraction, by estimation, is 55 to 60%. The left ventricle has normal function. The left ventricle has no regional wall motion abnormalities. The left ventricular internal cavity size was normal in size. There is  mild left ventricular hypertrophy. Left ventricular diastolic parameters are consistent with Grade I diastolic dysfunction (impaired relaxation). Right Ventricle: The right ventricular size is normal. No increase in right ventricular wall thickness. Right ventricular systolic function is normal. Left Atrium: Left atrial size was normal in size. Right Atrium: Right atrial size  was normal in size. Pericardium: There is no evidence of pericardial effusion. Mitral Valve: The mitral valve is normal in structure. Mild mitral valve regurgitation. Tricuspid Valve: The tricuspid valve is normal in structure. Tricuspid valve regurgitation is not demonstrated. Aortic Valve: The aortic valve is tricuspid. Aortic valve regurgitation is not visualized. Aortic valve mean gradient measures 4.0 mmHg. Aortic valve peak gradient measures 6.5 mmHg. Aortic valve area, by VTI measures 2.20 cm. Pulmonic Valve: The pulmonic valve was normal in structure. Pulmonic valve regurgitation is trivial. Aorta: The aortic root is normal in size and structure. Venous: The inferior vena cava is normal in size with greater than 50% respiratory variability, suggesting right atrial pressure of 3  mmHg. IAS/Shunts: No atrial level shunt detected by color flow Doppler.  LEFT VENTRICLE PLAX 2D LVIDd:         4.10 cm     Diastology LVIDs:         2.90 cm     LV e' medial:    5.11 cm/s LV PW:         1.10 cm     LV E/e' medial:  10.9 LV IVS:        1.10 cm     LV e' lateral:   4.90 cm/s LVOT diam:     2.00 cm     LV E/e' lateral: 11.4 LV SV:         62 LV SV Index:   40 LVOT Area:     3.14 cm  LV Volumes (MOD) LV vol d, MOD A2C: 42.3 ml LV vol d, MOD A4C: 72.1 ml LV vol s, MOD A2C: 18.8 ml LV vol s, MOD A4C: 32.1 ml LV SV MOD A2C:     23.5 ml LV SV MOD A4C:     72.1 ml LV SV MOD BP:      32.3 ml RIGHT VENTRICLE RV Basal diam:  3.60 cm RV S prime:     16.00 cm/s TAPSE (M-mode): 2.4 cm LEFT ATRIUM             Index       RIGHT ATRIUM           Index LA diam:        3.40 cm 2.20 cm/m  RA Area:     13.60 cm LA Vol (A2C):   39.4 ml 25.46 ml/m RA Volume:   32.90 ml  21.26 ml/m LA Vol (A4C):   43.2 ml 27.91 ml/m LA Biplane Vol: 43.8 ml 28.30 ml/m  AORTIC VALVE                   PULMONIC VALVE AV Area (Vmax):    2.50 cm    PV Vmax:       0.80 m/s AV Area (Vmean):   2.38 cm    PV Peak grad:  2.5 mmHg AV Area (VTI):     2.20 cm AV Vmax:           127.00 cm/s AV Vmean:          87.800 cm/s AV VTI:            0.280 m AV Peak Grad:      6.5 mmHg AV Mean Grad:      4.0 mmHg LVOT Vmax:         101.00 cm/s LVOT Vmean:        66.600 cm/s LVOT VTI:          0.196  m LVOT/AV VTI ratio: 0.70  AORTA Ao Root diam: 2.80 cm Ao Asc diam:  2.70 cm MITRAL VALVE MV Area (PHT): 2.62 cm    SHUNTS MV Decel Time: 290 msec    Systemic VTI:  0.20 m MV E velocity: 55.70 cm/s  Systemic Diam: 2.00 cm MV A velocity: 91.70 cm/s MV E/A ratio:  0.61 Kate Sable MD Electronically signed by Kate Sable MD Signature Date/Time: 03/09/2021/5:19:07 PM    Final     Assessment and Plan:   Maria Spencer is a 75 y.o. y/o female   She has a history of sigmoid diverticulitis and had a recurrence recently for which she was started on  antibiotics.  Each episode has been confirmed by CT scan.  Have been uncomplicated.  Colonoscopy performed in June 2021 showed no other abnormalities except diverticulosis of the sigmoid colon.  Had a third episode of diverticulitis in the third week of April again affecting the sigmoid colon with 2 small pericolonic abscesses 3.4 and 1.7 cm in size.  Due to see surgeons tomorrow.  She had a colonoscopy in June 2021 which showed no abnormalities except diverticulosis of the colon.  The colonoscopy was done within 1 year guidelines do not suggest a repeat.  Clinically her diverticulitis has resolved.  I informed her that the solution for recurrent diverticulitis would be to undergo sigmoid colon resection but obviously we cannot do so unless she is mentally prepared to do so which she is not at this point of time.  I told her the next time she would get symptoms of diverticulitis give me a call right away so we can avoid any delay in treatment.  Dr [name]  MD,MRCP [facility]) Follow up in as needed

## 2021-03-27 ENCOUNTER — Ambulatory Visit (INDEPENDENT_AMBULATORY_CARE_PROVIDER_SITE_OTHER): Payer: Medicare Other | Admitting: Surgery

## 2021-03-27 ENCOUNTER — Inpatient Hospital Stay: Payer: Medicare Other | Admitting: Surgery

## 2021-03-27 ENCOUNTER — Encounter: Payer: Self-pay | Admitting: Surgery

## 2021-03-27 VITALS — BP 153/81 | HR 82 | Temp 98.6°F | Ht 59.0 in | Wt 123.4 lb

## 2021-03-27 DIAGNOSIS — K5792 Diverticulitis of intestine, part unspecified, without perforation or abscess without bleeding: Secondary | ICD-10-CM

## 2021-03-27 NOTE — Progress Notes (Signed)
Surgical Clinic Progress/Follow-up Note   HPI:  75 y.o. Female presents to clinic in follow-up.  She had surgery last January and is still doing rehab, she is not interested at any elective operation at this point in time. She is on her last day of antibiotics.  Her predischarge CT scan showed resolution of the initial reported abscesses. For complicated diverticulitis follow-up   Patient reports  improvement/resolution of prior issues and has been tolerating regular diet with +flatus and 2 soft/loose daily BM's, denies N/V, fever/chills, CP, or SOB.  Review of Systems:  Constitutional: denies fever/chills  ENT: denies sore throat, hearing problems  Respiratory: denies shortness of breath, wheezing  Cardiovascular: denies chest pain, palpitations  Gastrointestinal: denies abdominal pain, N/V, or diarrhea/and bowel function as per interval history Skin: Denies any other rashes or skin discolorations  Vital Signs:  BP (!) 153/81   Pulse 82   Temp 98.6 F (37 C) (Oral)   Ht 4\' 11"  (1.499 m)   Wt 123 lb 6.4 oz (56 kg)   SpO2 94%   BMI 24.92 kg/m    Physical Exam:  Constitutional:  -- Normal body habitus  -- Awake, alert, and oriented x3  Pulmonary:  -- No crackles -- Equal breath sounds bilaterally -- Breathing non-labored at rest Cardiovascular:  -- S1, S2 present  -- No pericardial rubs  Gastrointestinal:  -- Soft and non-distended, non-tender/with no tenderness to palpation, no guarding/rebound tenderness Musculoskeletal / Integumentary:  -- Wounds or skin discoloration: None appreciated  -- Extremities: B/L UE and LE FROM, hands and feet warm, no edema   Imaging: No new pertinent imaging available for review   Assessment:  75 y.o. yo Female with a problem list including...  Patient Active Problem List   Diagnosis Date Noted  . Diverticulitis 03/23/2021  . Abscess   . Diverticulitis of large intestine with abscess 03/15/2021  . Oral thrush 03/15/2021  . Sepsis  ([place])   . Prediabetes 02/06/2021  . Tear of right gluteus medius tendon 12/05/2020  . Chronic bursitis of shoulder area (Right) 11/09/2020  . Chronic shoulder pain (Right) 11/09/2020  . Shoulder blade pain (Right) 11/09/2020  . Scoliosis of thoracic spine, unspecified scoliosis type 11/08/2020  . 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/20/2017) 11/08/2020  . Spondylosis without myelopathy or radiculopathy, cervical region 10/26/2020  . Abnormal MRI, cervical spine (09/26/2020) 10/23/2020  . Cervical facet syndrome (Bilateral) 10/23/2020  . Cervicogenic headache (Bilateral) 10/23/2020  . Occipital neuralgia (greater occipital nerve) (Bilateral) 10/23/2020  . Cervico-occipital neuralgia (Bilateral) 10/23/2020  . Cervical facet hypertrophy (Multilevel) (Bilateral) 10/23/2020  . Tear of right gluteus minimus tendon 10/06/2020  . Osteoarthritis of AC (acromioclavicular) joints (Bilateral) 10/05/2020  . Chronic Acromioclavicular (AC) joint pain (Bilateral) 10/05/2020  . Osteoarthritis of glenohumeral joint (Left) 10/05/2020  . Chronic shoulder pain (Bilateral) (R>L) 10/02/2020  . Chronic shoulder pain after shoulder replacement (Right) 10/02/2020  . Cervicalgia 09/13/2020  . Chronic upper extremity pain (Right) 09/13/2020  . Cervical spondylosis with radiculopathy (Right) 09/13/2020  . Cervical radiculopathy at C7 (Right) 09/13/2020  . Atypical chest pain 07/13/2020  . 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 Exertional dyspnea 07/13/2020  . Palpitation 07/13/2020  . GERD (gastroesophageal reflux disease) 07/13/2020  . Insomnia 07/11/2020  . Breast cancer ([place]) 06/22/2020  . 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) 06/22/2020  . DDD (degenerative disc disease), cervical 06/22/2020  . DDD (degenerative disc disease), thoracic 06/22/2020  . DDD (degenerative disc disease), lumbar 06/22/2020  . Displacement of  thoracic intervertebral disc (C6-T3, T4-5, T6-7, T8-9) 06/22/2020   . Non-traumatic compression fracture of T8 thoracic vertebra, sequela 06/22/2020  . Spondylosis without myelopathy or radiculopathy, thoracic region 05/11/2020  . Closed wedge compression fracture of T8 vertebra ([place]) 05/11/2020  . Osteoarthritis of hips (Bilateral) 05/11/2020  . Abnormal thoracolumbar CT myelogram (01/05/2020) 05/11/2020  . Vitamin D deficiency 05/11/2020  . Chronic pain syndrome 05/08/2020  . Colon cancer screening 05/08/2020  . Disorder of skeletal system 05/08/2020  . Problems influencing health status 05/08/2020  . Chronic lower extremity pain (3ry area of Pain) (Right) 05/08/2020  . Chronic knee pain (4th area of Pain) (Posterior) (Left) 05/08/2020  . Thoracic facet syndrome (Bilateral) 05/08/2020  . Chronic sacroiliac joint pain (Bilateral) 05/08/2020  . Osteoporosis 01/14/2020  . Chronic low back pain with sciatica 01/14/2020  . Chronic thoracic back pain (1ry area of Pain) (Bilateral) (R>L) 01/14/2020  . Fusion of spine of lumbosacral region 12/13/2019  . Lumbosacral spondylosis with radiculopathy 12/13/2019  . S/P LASIK (laser assisted in situ keratomileusis) of both eyes 11/01/2019  . Aortic atherosclerosis ([place]) 09/17/2019  . Chronic hip pain (2ry area of Pain) (Bilateral) (R>L) 09/13/2019  . Age-related nuclear cataract of right eye 08/19/2019  . Age-related nuclear cataract of left eye 08/19/2019  . 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 Arthritis 01/07/2018  . Chronic UTI 12/31/2017  . IBS (irritable bowel syndrome) 12/29/2017  . History of breast cancer 12/29/2017  . Baker's cyst of knee, left 12/29/2017  . Scoliosis of cervical region due to degenerative disease of spine in adult 09/18/2017  . Hypothyroidism 02/15/2013  . Hyperlipidemia 02/15/2013  . Elevated blood pressure reading with diagnosis of hypertension 02/15/2013  . Essential hypertension 02/15/2013    presents to clinic for follow-up  evaluation of diverticulitis requiring inpatient IV antibiotics, progressing well.  Plan:              - return to clinic in 2 months or as needed, instructed to call office if any questions or concerns We discussed probiotics, fiber and fluids.  We also discussed eating fruit and nuts is totally acceptable. Advised to pursue a goal of 25 to 30 g of fiber daily.  Made aware that the majority of this may be through natural sources, but advised to be aware of actual consumption and to ensure minimal consumption by daily supplementation.  Various forms of supplements discussed.  Strongly advised to consume more fluids to ensure adequate hydration, instructed to watch color of urine to determine adequacy of hydration.  Clarity is pursued in urine output, and bowel activity that correlates to significant meal intake.  Patient is to avoid deferring having bowel movements, advised to take the time at the first sign of sensation, typically following meals and in the morning.  Subsequent utilization of MiraLAX to ensure at least daily movement, ideally twice daily bowel movements.  If multiple doses of MiraLAX are necessary utilize them.  All of the above recommendations were discussed with the patient, and all of patient's questions were answered to her expressed satisfaction.  Ronny Bacon, MD, FACS [place]: [facility]. Office: [phone]

## 2021-03-27 NOTE — Patient Instructions (Signed)
Eat more fiber (25 grams daily)and drink plenty of water. You will want to see your urine clear. You may also try fiber supplements or gummies.   If you have any concerns or questions, please feel free to call our office.    Diverticulitis  Diverticulitis is infection or inflammation of small pouches (diverticula) in the colon that form due to a condition called diverticulosis. Diverticula can trap stool (feces) and bacteria, causing infection and inflammation. Diverticulitis may cause severe stomach pain and diarrhea. It may lead to tissue damage in the colon that causes bleeding or blockage. The diverticula may also burst (rupture) and cause infected stool to enter other areas of the abdomen. What are the causes? This condition is caused by stool becoming trapped in the diverticula, which allows bacteria to grow in the diverticula. This leads to inflammation and infection. What increases the risk? You are more likely to develop this condition if you have diverticulosis. The risk increases if you:  Are overweight or obese.  Do not get enough exercise.  Drink alcohol.  Use tobacco products.  Eat a diet that has a lot of red meat such as beef, pork, or lamb.  Eat a diet that does not include enough fiber. High-fiber foods include fruits, vegetables, beans, nuts, and whole grains.  Are over 64 years of age. What are the signs or symptoms? Symptoms of this condition may include:  Pain and tenderness in the abdomen. The pain is normally located on the left side of the abdomen, but it may occur in other areas.  Fever and chills.  Nausea.  Vomiting.  Cramping.  Bloating.  Changes in bowel routines.  Blood in your stool. How is this diagnosed? This condition is diagnosed based on:  Your medical history.  A physical exam.  Tests to make sure there is nothing else causing your condition. These tests may include: ? Blood tests. ? Urine tests. ? CT scan of the  abdomen. How is this treated? Most cases of this condition are mild and can be treated at home. Treatment may include:  Taking over-the-counter pain medicines.  Following a clear liquid diet.  Taking antibiotic medicines by mouth.  Resting. More severe cases may need to be treated at a hospital. Treatment may include:  Not eating or drinking.  Taking prescription pain medicine.  Receiving antibiotic medicines through an IV.  Receiving fluids and nutrition through an IV.  Surgery. When your condition is under control, your health care provider may recommend that you have a colonoscopy. This is an exam to look at the entire large intestine. During the exam, a lubricated, bendable tube is inserted into the anus and then passed into the rectum, colon, and other parts of the large intestine. A colonoscopy can show how severe your diverticula are and whether something else may be causing your symptoms. Follow these instructions at home: Medicines  Take over-the-counter and prescription medicines only as told by your health care provider. These include fiber supplements, probiotics, and stool softeners.  If you were prescribed an antibiotic medicine, take it as told by your health care provider. Do not stop taking the antibiotic even if you start to feel better.  Ask your health care provider if the medicine prescribed to you requires you to avoid driving or using machinery. Eating and drinking  Follow a full liquid diet or another diet as directed by your health care provider.  After your symptoms improve, your health care provider may tell you to change your  diet. He or she may recommend that you eat a diet that contains at least 25 grams (25 g) of fiber daily. Fiber makes it easier to pass stool. Healthy sources of fiber include: ? Berries. One cup contains 4-8 grams of fiber. ? Beans or lentils. One-half cup contains 5-8 grams of fiber. ? Green vegetables. One cup contains 4 grams  of fiber.  Avoid eating red meat.   General instructions  Do not use any products that contain nicotine or tobacco, such as cigarettes, e-cigarettes, and chewing tobacco. If you need help quitting, ask your health care provider.  Exercise for at least 30 minutes, 3 times each week. You should exercise hard enough to raise your heart rate and break a sweat.  Keep all follow-up visits as told by your health care provider. This is important. You may need to have a colonoscopy. Contact a health care provider if:  Your pain does not improve.  Your bowel movements do not return to normal. Get help right away if:  Your pain gets worse.  Your symptoms do not get better with treatment.  Your symptoms suddenly get worse.  You have a fever.  You vomit more than one time.  You have stools that are bloody, black, or tarry. Summary  Diverticulitis is infection or inflammation of small pouches (diverticula) in the colon that form due to a condition called diverticulosis. Diverticula can trap stool (feces) and bacteria, causing infection and inflammation.  You are at higher risk for this condition if you have diverticulosis and you eat a diet that does not include enough fiber.  Most cases of this condition are mild and can be treated at home. More severe cases may need to be treated at a hospital.  When your condition is under control, your health care provider may recommend that you have an exam called a colonoscopy. This exam can show how severe your diverticula are and whether something else may be causing your symptoms.  Keep all follow-up visits as told by your health care provider. This is important. This information is not intended to replace advice given to you by your health care provider. Make sure you discuss any questions you have with your health care provider. Document Revised: 08/23/2019 Document Reviewed: 08/23/2019 Elsevier Patient Education  2021 [place].    High-Fiber Eating Plan Fiber, also called dietary fiber, is a type of carbohydrate. It is found foods such as fruits, vegetables, whole grains, and beans. A high-fiber diet can have many health benefits. Your health care provider may recommend a high-fiber diet to help:  Prevent constipation. Fiber can make your bowel movements more regular.  Lower your cholesterol.  Relieve the following conditions: ? Inflammation of veins in the anus (hemorrhoids). ? Inflammation of specific areas of the digestive tract (uncomplicated diverticulosis). ? A problem of the large intestine, also called the colon, that sometimes causes pain and diarrhea (irritable bowel syndrome, or IBS).  Prevent overeating as part of a weight-loss plan.  Prevent heart disease, type 2 diabetes, and certain cancers. What are tips for following this plan? Reading food labels  Check the nutrition facts label on food products for the amount of dietary fiber. Choose foods that have 5 grams of fiber or more per serving.  The goals for recommended daily fiber intake include: ? Men (age 73 or younger): 34-38 g. ? Men (over age 58): 28-34 g. ? Women (age 41 or younger): 25-28 g. ? Women (over age 58): 22-25 g. Your  daily fiber goal is _____________ g.   Shopping  Choose whole fruits and vegetables instead of processed forms, such as apple juice or applesauce.  Choose a wide variety of high-fiber foods such as avocados, lentils, oats, and kidney beans.  Read the nutrition facts label of the foods you choose. Be aware of foods with added fiber. These foods often have high sugar and sodium amounts per serving. Cooking  Use whole-grain flour for baking and cooking.  Cook with brown rice instead of white rice. Meal planning  Start the day with a breakfast that is high in fiber, such as a cereal that contains 5 g of fiber or more per serving.  Eat breads and cereals that are made with whole-grain flour instead of  refined flour or white flour.  Eat brown rice, bulgur wheat, or millet instead of white rice.  Use beans in place of meat in soups, salads, and pasta dishes.  Be sure that half of the grains you eat each day are whole grains. General information  You can get the recommended daily intake of dietary fiber by: ? Eating a variety of fruits, vegetables, grains, nuts, and beans. ? Taking a fiber supplement if you are not able to take in enough fiber in your diet. It is better to get fiber through food than from a supplement.  Gradually increase how much fiber you consume. If you increase your intake of dietary fiber too quickly, you may have bloating, cramping, or gas.  Drink plenty of water to help you digest fiber.  Choose high-fiber snacks, such as berries, raw vegetables, nuts, and popcorn. What foods should I eat? Fruits Berries. Pears. Apples. Oranges. Avocado. Prunes and raisins. Dried figs. Vegetables Sweet potatoes. Spinach. Kale. Artichokes. Cabbage. Broccoli. Cauliflower. Green peas. Carrots. Squash. Grains Whole-grain breads. Multigrain cereal. Oats and oatmeal. Brown rice. Barley. Bulgur wheat. [place]. Quinoa. Bran muffins. Popcorn. Rye wafer crackers. Meats and other proteins Navy beans, kidney beans, and pinto beans. Soybeans. Split peas. Lentils. Nuts and seeds. Dairy Fiber-fortified yogurt. Beverages Fiber-fortified soy milk. Fiber-fortified orange juice. Other foods Fiber bars. The items listed above may not be a complete list of recommended foods and beverages. Contact a dietitian for more information. What foods should I avoid? Fruits Fruit juice. Cooked, strained fruit. Vegetables Fried potatoes. Canned vegetables. Well-cooked vegetables. Grains White bread. Pasta made with refined flour. White rice. Meats and other proteins Fatty cuts of meat. Fried chicken or fried fish. Dairy Milk. Yogurt. Cream cheese. Sour cream. Fats and  oils Butters. Beverages Soft drinks. Other foods Cakes and pastries. The items listed above may not be a complete list of foods and beverages to avoid. Talk with your dietitian about what choices are best for you. Summary  Fiber is a type of carbohydrate. It is found in foods such as fruits, vegetables, whole grains, and beans.  A high-fiber diet has many benefits. It can help to prevent constipation, lower blood cholesterol, aid weight loss, and reduce your risk of heart disease, diabetes, and certain cancers.  Increase your intake of fiber gradually. Increasing fiber too quickly may cause cramping, bloating, and gas. Drink plenty of water while you increase the amount of fiber you consume.  The best sources of fiber include whole fruits and vegetables, whole grains, nuts, seeds, and beans. This information is not intended to replace advice given to you by your health care provider. Make sure you discuss any questions you have with your health care provider. Document Revised: 03/16/2020 Document Reviewed:  03/16/2020 Elsevier Patient Education  2021 [employer].

## 2021-03-29 DIAGNOSIS — M25551 Pain in right hip: Secondary | ICD-10-CM | POA: Diagnosis not present

## 2021-04-05 DIAGNOSIS — M25551 Pain in right hip: Secondary | ICD-10-CM | POA: Diagnosis not present

## 2021-04-09 ENCOUNTER — Encounter: Payer: Self-pay | Admitting: Urology

## 2021-04-09 ENCOUNTER — Other Ambulatory Visit: Payer: Self-pay

## 2021-04-09 ENCOUNTER — Ambulatory Visit (INDEPENDENT_AMBULATORY_CARE_PROVIDER_SITE_OTHER): Payer: Medicare Other | Admitting: Urology

## 2021-04-09 VITALS — BP 150/80 | HR 73 | Ht 59.0 in | Wt 123.0 lb

## 2021-04-09 DIAGNOSIS — N3946 Mixed incontinence: Secondary | ICD-10-CM | POA: Diagnosis not present

## 2021-04-09 DIAGNOSIS — N3020 Other chronic cystitis without hematuria: Secondary | ICD-10-CM

## 2021-04-09 MED ORDER — CEPHALEXIN 250 MG PO CAPS: 250.0000 mg | ORAL_CAPSULE | Freq: Two times a day (BID) | ORAL | 3 refills | Status: DC

## 2021-04-09 NOTE — Progress Notes (Signed)
04/09/2021 8:58 AM   Maria Spencer 01-Nov-1946 [id number]  Referring provider: McLean-Scocuzza, Nino Glow, MD [place],  [place] [address]  Chief Complaint  Patient presents with  . Follow-up    HPI: The patient was consulted to [place] for recurrent urinary tract infections. I went through some of her medical records. She has had positive cultures resistant to antibiotics. She had been placed on trimethoprim for 90 days and had a sling 25 years ago. She had a negative CAT scan in May 2016.   The patient describes pressure and bloating and small volume frequency that generally respond to antibiotics. She can get an infection every several weeks. She was on Macrodantin daily for 30 days. 10 days after stopping it she once again had another infection  At baseline she voids every 2 hours and is continent. She gets up 3-4 times a night   The patient has little to no symptoms and possibly some mild fullness from her cystocele.   Patient describes 6 or 7 bladder infections in the last year. She will get difficulty voiding and burning and heaviness that respond usually to antibiotics. She has finished an antibiotic and she is not for certain if it is totally cleared. She describes breakthrough infections on the daily Macrodantin so was asked to stop it due to breakthrough resistant organisms  She has a severe reaction to sulfa drug and I think is best not to try trimethoprim which may have worked in the past but she was not necessarily taking it daily. And now looks like she might be allergic to Macrodantin when I reviewed her allergies. She understands her relative treatment options and we talked about daily Keflex. I also mentioned local estrogen cream probiotics and cranberry  Reassess in 8 weeks on daily Keflex. Call if urine culture is positive.She might be interested in taking estrogen cream but will speak to her breast cancer provider before every doing  so  Clinically was infection free on Keflex. She developed thrush and treated herself with diet changes and mouthwash. Culture in May when I saw her negative.  Came in with breakthrough symptoms and a positive culture and saw a nurse practitioner a few months ago.  Antibiotic switch to doxycycline.  Patient had stopped daily Keflex because of a lot of hospital issues with her back neck and hip.  Last Thursday she started having strong smelling urine and pressure.  She put herself back on daily Keflex with d-mannose AZO and increase water intake and symptoms are improving.  Still on the daily Keflex.  Today Frequency stable.  Came in with cystitis symptoms in February on daily Keflex her culture was negative She has had a lot of trouble with diverticulitis and has been on numerous antibiotics.  She is just coming off and will go back on her daily Keflex.  Infection free clinically.    PMH: Past Medical History:  Diagnosis Date  . Arthritis    neck, back, left knee;   . Breast cancer ([place]) 2009   right lumpectomy s/p radiation x 1 week 2x per day only   . Chicken pox   . Chronic UTI    established with urology   . COVID-19    ? 12/2020 sob   . Diverticular disease    -osis and -itis   . Esophagitis    egd 05/17/14 see report scanned into chart  . History of kidney stones   . Hormone disorder   . Hyperlipidemia   .  Hypertension    controlled well with medication;   . Hypothyroidism   . Osteoporosis   . Personal history of radiation therapy 2009   F/U right breast cancer  . Thyroid disease    hypothyroidism     Surgical History: Past Surgical History:  Procedure Laterality Date  . BONE GRAFT HIP ILIAC CREST     + cage left hip 10/23/17   . BREAST BIOPSY Left 2009   clip,benign  . BREAST BIOPSY Right 2009   +  . BREAST LUMPECTOMY Right 2009   2009 lumpectomy   . CARDIAC CATHETERIZATION     No stents; [facility] doesn't recall facility +13yrs ago  . CATARACT  EXTRACTION, BILATERAL Bilateral 10/2019  . COLONOSCOPY WITH PROPOFOL N/A 05/15/2020   Procedure: COLONOSCOPY WITH PROPOFOL;  Surgeon: Lin Landsman, MD;  Location[facility] ENDOSCOPY;  Service: Gastroenterology;  Laterality: N/A;  . EYE SURGERY     b/l cataracts sch 10/2019 in [place]  . HERNIA REPAIR    . INSERTION OF MESH N/A 05/29/2018   Procedure: INSERTION OF MESH;  Surgeon: Johnathan Hausen, MD;  Location: [facility] ORS;  Service: General;  Laterality: N/A;  . JOINT REPLACEMENT    . QUADRICEPS TENDON REPAIR Right 12/05/2020   Procedure: OPEN REPAIR OF RIGHT GLUTEUS MINIMUS TENDON;  Surgeon: Corky Mull, MD;  Location: [facility] ORS;  Service: Orthopedics;  Laterality: Right;  . right gluteus medius/minimus repair Dr. Roland Rack 11/2020      Dr. Roland Rack  . SHOULDER SURGERY Left 2008, 2009   x2 rotator cuff surgeries left shoulders  . SPINAL FUSION  07/24/2020  . SPINE SURGERY  10/23/2017   spinal 09/2017 h/o scoliosis [facility] L4-S1 OLIF T10 to ililum fusion  . TONSILLECTOMY     age 78 y.o.   . TOTAL SHOULDER REPLACEMENT Right 2012  . TUBAL LIGATION    . VENTRAL HERNIA REPAIR N/A 05/29/2018   Procedure: LAPAROSCOPIC VENTRAL / [place];  Surgeon: Johnathan Hausen, MD;  Location: [facility] ORS;  Service: General;  Laterality: N/A;    Home Medications:  Allergies as of 04/09/2021      Reactions   Sulfa Antibiotics Anaphylaxis, Hives, Itching, Shortness Of Breath, Swelling   Lips & throat swelled   Nitrofuran Derivatives Swelling   Lip swelling; "eyes get red and puffy"   Amlodipine Besylate Other (See Comments)   Gas, bloating  Gas, bloating  Heart racing    Nitrofurantoin Hives, Other (See Comments)      Medication List       Accurate as of Apr 09, 2021  8:58 AM. If you have any questions, ask your nurse or doctor.        atorvastatin 20 MG tablet Commonly known as: LIPITOR Take 1 tablet (20 mg total) by mouth every other day. D/c 10 mg pill increased dose   [facility] CALCIUM 600 & VITAMIN D3  PO Take 1 tablet by mouth daily.   fluticasone 50 MCG/ACT nasal spray Commonly known as: FLONASE Place 2 sprays into both nostrils daily. Max b/l nostrils   levothyroxine 88 MCG tablet Commonly known as: SYNTHROID Take 1 tablet (88 mcg total) by mouth daily before breakfast. 30 minutes   metoprolol succinate 50 MG 24 hr tablet Commonly known as: TOPROL-XL Take 1 tablet (50 mg total) by mouth daily. Take with or immediately following a meal.   Multi-Vitamins Tabs Take 1 tablet by mouth daily.   telmisartan 80 MG tablet Commonly known as: Micardis Take 1 tablet (80 mg  total) by mouth daily. In place of losartan 100. Take 1/2 pill day 3 days and if BP >130/>80 take 1 pill day 4 and beyond daily   vitamin B-12 1000 MCG tablet Commonly known as: CYANOCOBALAMIN Take 1,000 mcg by mouth daily.   zolpidem 5 MG tablet Commonly known as: AMBIEN Take 1 tablet (5 mg total) by mouth at bedtime as needed for sleep.       Allergies:  Allergies  Allergen Reactions  . Sulfa Antibiotics Anaphylaxis, Hives, Itching, Shortness Of Breath and Swelling    Lips & throat swelled  . Nitrofuran Derivatives Swelling    Lip swelling; "eyes get red and puffy"  . Amlodipine Besylate Other (See Comments)    Gas, bloating   Gas, bloating  Heart racing   . Nitrofurantoin Hives and Other (See Comments)    Family History: Family History  Problem Relation Age of Onset  . Arthritis Mother   . Heart disease Mother 54       stent   . Hyperlipidemia Mother   . Hypertension Mother   . Coronary artery disease Mother 80  . Arthritis Father   . Diabetes Father   . Cancer Father        colon  . AAA (abdominal aortic aneurysm) Father   . Arthritis Sister   . Hyperlipidemia Sister   . Hypertension Sister   . Cancer Brother        lung, smoker  . Mental retardation Brother   . Drug abuse Daughter        overdose in 2018   . Arthritis Sister   . Hyperlipidemia Sister   . Bladder Cancer Neg Hx    . Kidney cancer Neg Hx   . Breast cancer Neg Hx     Social History:  reports that she has never smoked. She has never used smokeless tobacco. She reports previous alcohol use of about 7.0 standard drinks of alcohol per week. She reports that she does not use drugs.  ROS:                                        Physical Exam: BP (!) 150/80   Pulse 73   Ht 4\' 11"  (1.499 m)   Wt 55.8 kg   BMI 24.84 kg/m   Constitutional:  Alert and oriented, No acute distress.  Laboratory Data: Lab Results  Component Value Date   WBC 5.5 03/19/2021   HGB 12.3 03/19/2021   HCT 37.6 03/19/2021   MCV 81.6 03/19/2021   PLT 287 03/19/2021    Lab Results  Component Value Date   CREATININE 0.72 03/19/2021    No results found for: PSA  No results found for: TESTOSTERONE  Lab Results  Component Value Date   HGBA1C 5.7 02/06/2021    Urinalysis    Component Value Date/Time   COLORURINE YELLOW (A) 03/15/2021 1109   APPEARANCEUR CLEAR (A) 03/15/2021 1109   APPEARANCEUR Clear 01/18/2021 1115   LABSPEC 1.045 (H) 03/15/2021 1109   PHURINE 6.0 03/15/2021 1109   GLUCOSEU NEGATIVE 03/15/2021 [place] 09/22/2018 1416   [place] 03/15/2021 1109   BILIRUBINUR NEGATIVE 03/15/2021 1109   BILIRUBINUR Negative 01/18/2021 [place] 03/15/2021 1109   PROTEINUR NEGATIVE 03/15/2021 1109   UROBILINOGEN 1.0 01/24/2020 1107   UROBILINOGEN 0.2 09/22/2018 1416   NITRITE NEGATIVE 03/15/2021 1109   LEUKOCYTESUR  NEGATIVE 03/15/2021 1109    Pertinent Imaging:   Assessment & Plan: 90x3 Keflex sent to pharmacy and see near  There are no diagnoses linked to this encounter.  No follow-ups on file.  Reece Packer, MD  [facility] [address] [place], [place] [address] [phone]

## 2021-04-10 ENCOUNTER — Ambulatory Visit (INDEPENDENT_AMBULATORY_CARE_PROVIDER_SITE_OTHER): Payer: Medicare Other | Admitting: Family

## 2021-04-10 ENCOUNTER — Encounter: Payer: Self-pay | Admitting: Family

## 2021-04-10 VITALS — BP 130/78 | HR 80 | Ht 59.0 in | Wt 125.4 lb

## 2021-04-10 DIAGNOSIS — R0609 Other forms of dyspnea: Secondary | ICD-10-CM

## 2021-04-10 DIAGNOSIS — R0600 Dyspnea, unspecified: Secondary | ICD-10-CM | POA: Diagnosis not present

## 2021-04-10 DIAGNOSIS — I10 Essential (primary) hypertension: Secondary | ICD-10-CM | POA: Diagnosis not present

## 2021-04-10 DIAGNOSIS — E782 Mixed hyperlipidemia: Secondary | ICD-10-CM

## 2021-04-10 DIAGNOSIS — M25551 Pain in right hip: Secondary | ICD-10-CM | POA: Diagnosis not present

## 2021-04-10 NOTE — Progress Notes (Signed)
Office Visit    Patient Name: Maria Spencer Date of Encounter: 04/10/2021  PCP:  McLean-Scocuzza, Nino Glow, MD   [place]  Cardiologist:  [name], MD  Advanced Practice Provider:  No care team member to display Electrophysiologist:  None    Chief Complaint    Maria Spencer is a 75 y.o. female with a hx of hypertension, hyperlipidemia, right breast cancer s/p lumpectomy and radiation, hypothyroidism, esophagitis presents today for hospital follow up.  Past Medical History    Past Medical History:  Diagnosis Date  . Arthritis    neck, back, left knee;   . Breast cancer ([place]) 2009   right lumpectomy s/p radiation x 1 week 2x per day only   . Chicken pox   . Chronic UTI    established with urology   . COVID-19    ? 12/2020 sob   . Diverticular disease    -osis and -itis   . Esophagitis    egd 05/17/14 see report scanned into chart  . History of kidney stones   . Hormone disorder   . Hyperlipidemia   . Hypertension    controlled well with medication;   . Hypothyroidism   . Osteoporosis   . Personal history of radiation therapy 2009   F/U right breast cancer  . Thyroid disease    hypothyroidism    Past Surgical History:  Procedure Laterality Date  . BONE GRAFT HIP ILIAC CREST     + cage left hip 10/23/17   . BREAST BIOPSY Left 2009   clip,benign  . BREAST BIOPSY Right 2009   +  . BREAST LUMPECTOMY Right 2009   2009 lumpectomy   . CARDIAC CATHETERIZATION     No stents; [facility] doesn't recall facility +56yr ago  . CATARACT EXTRACTION, BILATERAL Bilateral 10/2019  . COLONOSCOPY WITH PROPOFOL N/A 05/15/2020   Procedure: COLONOSCOPY WITH PROPOFOL;  Surgeon: VLin Landsman MD;  [facility];  Service: Gastroenterology;  Laterality: N/A;  . EYE SURGERY     [place] HERNIA REPA[place]    . INSERTION OF MESH N/A 05/29/2018   Procedure: INSERTION OF MESH;  Surgeon: MJohnathan Hausen MD;   Location: [facility] ORS;  Service: General;  Laterality: N/A;  . JOINT REPLACEMENT    . QUADRICEPS TENDON REPAIR Right 12/05/2020   Procedure: OPEN REPAIR OF RIGHT GLUTEUS MINIMUS TENDON;  Surgeon: PCorky Mull MD;  Location: [facility] ORS;  Service: Orthopedics;  Laterality: Right;  . right gluteus medius/minimus repair Dr. PRoland Rack1/2022      Dr. PRoland Rack . SHOULDER SURGERY Left 2008, 2009   x2 rotator cuff surgeries left shoulders  . SPINAL FUSION  07/24/2020  . SPINE SURGERY  10/23/2017   spinal 09/2017 h/o scoliosis [facility] L4-S1 OLIF T10 to ililum fusion  . TONSILLECTOMY     age 75y.o.   . TOTAL SHOULDER REPLACEMENT Right 2012  . TUBAL LIGATION    . VENTRAL HERNIA REPAIR N/A 05/29/2018   Procedure: LAPAROSCOPIC VENTRAL / LATERAL HERNIA REPAIR;  Surgeon: MJohnathan Hausen MD;  Location: [facility] ORS;  Service: General;  Laterality: N/A;    Allergies  Allergies  Allergen Reactions  . Sulfa Antibiotics Anaphylaxis, Hives, Itching, Shortness Of Breath and Swelling    Lips & throat swelled  . Nitrofuran Derivatives Swelling    Lip swelling; "eyes get red and puffy"  . Amlodipine Besylate Other (See Comments)    Gas, bloating  Gas, bloating  Heart racing   . Nitrofurantoin Hives and Other (See Comments)    History of Present Illness    Maria Spencer is a 75 y.o. female with a hx of hypertension, hyperlipidemia, right breast cancer specifically lumpectomy and radiation, hypothyroidism, esophagitis last seen 02/14/21.  She lost her spouse August 2020 to cancer.  She has had multiple health ailments since that time including spinal fusion.  Most recently she was seen by Dr. Saunders Revel 11/29/2020 for preoperative clearance for tendon repair.  At that visit she reported no chest pain, shortness of breath, lightheadedness, edema.  Due to elevated blood pressure Toprol was increased to 50 mg daily and lisinopril maintained at 40 mg daily.  She was seen in follow-up 02/11/2021 due to the chest pain, blood pressure  follow-up.  Her chest pain was atypical for angina as it was tender on palpation.  Due to dyspnea echo was ordered.  Echocardiogram 03/09/2021 showed LVEF 55 to 60%, no R WMA, mild LVH, grade 1 diastolic dysfunction, RV normal size and function, mild MR, tricuspid aortic valve with no regurgitation, RA pressure 3 mmHg.  She was subsequently admitted 03/15/2021 - 03/20/2021 due to sepsis due to diverticulitis of large intestine with abscess.  Presents today for follow-up of the direction of her primary care provider.  Seen by their office 02/13/2020 noting chest pain, shortness of breath as well as elevated blood pressure.  She was initially transition from lisinopril to losartan but due to medication shortage is was recommended to start telmisartan 40 mg daily and increase to 80 mg if her blood pressure was not at goal.  Tells me she will have sharp pains under her left armpit. Happen when she is sitting still. Tells me it has been ongoing for a couple of months and has been the same. Improves with standing and taking a deep breath.  We discussed that this is atypical for angina.  She is presently on antibiotics for diverticulitis and had abdominal CT scan which inadvertently had a finding cardiomegaly.  She endorses shortness of breath when she is talking long periods of time on the phone and also with exertion.  Does note that she has not been as active as usual with her recent tendon surgery and is working with physical therapy to increase her activity.  She presents today for follow up.  Tells me she is feeling well since hospital discharge.  She has met with general surgery as well as gastroenterology and plans to try and manage her diverticulitis with diet changes.  Reports no shortness of breath. She reports dyspnea on exertion with more than usual activity. Reports no chest pain, pressure, or tightness. No edema, orthopnea, PND. Reports no palpitations.   EKGs/Labs/Other Studies Reviewed:   The  following studies were reviewed today:  Echo 03/09/21  1. Left ventricular ejection fraction, by estimation, is 55 to 60%. The  left ventricle has normal function. The left ventricle has no regional  wall motion abnormalities. There is mild left ventricular hypertrophy.  Left ventricular diastolic parameters  are consistent with Grade I diastolic dysfunction (impaired relaxation).   2. Right ventricular systolic function is normal. The right ventricular  size is normal.   3. The mitral valve is normal in structure. Mild mitral valve  regurgitation.   4. The aortic valve is tricuspid. Aortic valve regurgitation is not  visualized.   5. The inferior vena cava is normal in size with greater than 50%  respiratory variability, suggesting right atrial pressure  of 3 mmHg.   EKG:  EKG is  ordered today.  The ekg ordered today demonstrates NSR 80 bpm with no acute ST/T wave changes.   Recent Labs: 05/08/2020: Magnesium 2.0 02/06/2021: TSH 1.58 03/15/2021: ALT 15; B Natriuretic Peptide 39.3 03/19/2021: BUN <5; Creatinine, Ser 0.72; Hemoglobin 12.3; Platelets 287; Potassium 3.8; Sodium 138  Recent Lipid Panel    Component Value Date/Time   CHOL 201 (H) 02/06/2021 1019   CHOL 208 (H) 07/12/2020 0917   TRIG 119.0 02/06/2021 1019   HDL 68.30 02/06/2021 1019   HDL 74 07/12/2020 0917   CHOLHDL 3 02/06/2021 1019   VLDL 23.8 02/06/2021 1019   LDLCALC 109 (H) 02/06/2021 1019   LDLCALC 112 (H) 07/12/2020 0917   LDLCALC 138 (H) 09/10/2019 1456   Home Medications   Current Meds  Medication Sig  . atorvastatin (LIPITOR) 20 MG tablet Take 1 tablet (20 mg total) by mouth every other day. D/c 10 mg pill increased dose  . Calcium Carb-Cholecalciferol ([facility] CALCIUM 600 & VITAMIN D3 PO) Take 1 tablet by mouth daily.  . fluticasone (FLONASE) 50 MCG/ACT nasal spray Place 2 sprays into both nostrils daily. Max b/l nostrils  . levothyroxine (SYNTHROID) 88 MCG tablet Take 1 tablet (88 mcg total) by mouth daily  before breakfast. 30 minutes  . metoprolol succinate (TOPROL-XL) 50 MG 24 hr tablet Take 1 tablet (50 mg total) by mouth daily. Take with or immediately following a meal.  . Multiple Vitamin (MULTI-VITAMINS) TABS Take 1 tablet by mouth daily.   Marland Kitchen telmisartan (MICARDIS) 80 MG tablet Take 1 tablet (80 mg total) by mouth daily. In place of losartan 100. Take 1/2 pill day 3 days and if BP >130/>80 take 1 pill day 4 and beyond daily  . vitamin B-12 (CYANOCOBALAMIN) 1000 MCG tablet Take 1,000 mcg by mouth daily.  Marland Kitchen zolpidem (AMBIEN) 5 MG tablet Take 1 tablet (5 mg total) by mouth at bedtime as needed for sleep.    Review of Systems  All other systems reviewed and are otherwise negative except as noted above.  Physical Exam    VS:  BP 130/78 (BP Location: Left Arm, Patient Position: Sitting, Cuff Size: Normal)   Pulse 80   Ht _0  (1.499 m)   Wt 125 lb 6 oz (56.9 kg)   SpO2 98%   BMI 25.32 kg/m  , BMI Body mass index is 25.32 kg/m.  Wt Readings from Last 3 Encounters:  04/10/21 125 lb 6 oz (56.9 kg)  04/09/21 123 lb (55.8 kg)  03/27/21 123 lb 6.4 oz (56 kg)    GEN: Well nourished, well developed, in no acute distress. HEENT: normal. Neck: Supple, no JVD, carotid bruits, or masses. Cardiac: RRR, no murmurs, rubs, or gallops. No clubbing, cyanosis, edema.  Radials/DP/PT 2+ and equal bilaterally.  Respiratory:  Respirations regular and unlabored, clear to auscultation bilaterally. GI: Soft, nontender, nondistended. MS: No deformity or atrophy. Skin: Warm and dry, no rash. Neuro:  Strength and sensation are intact. Psych: Normal affect.  Assessment & Plan    1. Atypical chest pain - Previous chest pain deemed musculoskeletal and noncardiac as it was sharp pain under her left armpit that occurred at rest and resolved with stretching.  No recurrence.   Cardiac CTA 2020 with calcium score of 0.  No indication for ischemic evaluation at this time.  2. DOE / Cardiomegaly-reports DOE is  overall improving.  Echo 02/2021 with normal LVEF, grade 1 diastolic dysfunction, mild LVH, mild MR.  We discussed the importance of optimal blood pressure control.  Repeat echocardiogram only as clinically indicated for new or worsening symptoms.  3. HTN - BP well controlled. Continue current antihypertensive regimen.   4. Diverticulitis - Continue to follow with primary care provider, GI, general surgery.  Should she decide to pursue surgery she is acceptable risk at this time by AHA/ACC guidelines without additional cardiovascular testing.  A formal clearance request would need to be sent to our office.   Disposition: Follow up in 6 month(s) with Dr. Saunders Revel or APP.   Signed, Loel Dubonnet, NP 04/10/2021, 10:01 AM [place]

## 2021-04-10 NOTE — Patient Instructions (Addendum)
Medication Instructions:  Continue your current medications.   *If you need a refill on your cardiac medications before your next appointment, please call your pharmacy*  Lab Work: None ordered today.   Testing/Procedures: None ordered today.   Follow-Up: At [facility], you and your health needs are our priority.  As part of our continuing mission to provide you with exceptional heart care, we have created designated Provider Care Teams.  These Care Teams include your primary Cardiologist (physician) and Advanced Practice Providers (APPs -  Physician Assistants and Nurse Practitioners) who all work together to provide you with the care you need, when you need it.   Your next appointment:   6 month(s)  The format for your next appointment:   In Person  Provider:   You may see [name], MD or one of the following Advanced Practice Providers on your designated Care Team:    Murray Hodgkins, NP  Christell Faith, PA-C  Marrianne Mood, PA-C  Cadence Kathlen Mody, [place]  Other Instructions  Heart Healthy Diet Recommendations: A low-salt diet is recommended. Meats should be grilled, baked, or boiled. Avoid fried foods. Focus on lean protein sources like fish or chicken with vegetables and fruits. The American Heart Association is a Microbiologist!  American Heart Association Diet and Lifeystyle Recommendations   Exercise recommendations: The American Heart Association recommends 150 minutes of moderate intensity exercise weekly. Try 30 minutes of moderate intensity exercise 4-5 times per week. This could include walking, jogging, or swimming.

## 2021-04-16 DIAGNOSIS — M25551 Pain in right hip: Secondary | ICD-10-CM | POA: Diagnosis not present

## 2021-04-18 DIAGNOSIS — M1712 Unilateral primary osteoarthritis, left knee: Secondary | ICD-10-CM | POA: Diagnosis not present

## 2021-04-20 DIAGNOSIS — S76011D Strain of muscle, fascia and tendon of right hip, subsequent encounter: Secondary | ICD-10-CM | POA: Diagnosis not present

## 2021-05-08 ENCOUNTER — Telehealth: Payer: Self-pay

## 2021-05-08 NOTE — Telephone Encounter (Signed)
Pt called and states that she needs a prior auth for refill of   zolpidem (AMBIEN) 5 MG tablet [facility] [address] [facility] [place]

## 2021-05-14 ENCOUNTER — Other Ambulatory Visit: Payer: Self-pay

## 2021-05-14 ENCOUNTER — Encounter: Payer: Self-pay | Admitting: Physician Assistant

## 2021-05-14 ENCOUNTER — Ambulatory Visit (INDEPENDENT_AMBULATORY_CARE_PROVIDER_SITE_OTHER): Payer: Medicare Other | Admitting: Physician Assistant

## 2021-05-14 VITALS — BP 164/89 | HR 67 | Ht 59.0 in | Wt 128.0 lb

## 2021-05-14 DIAGNOSIS — R300 Dysuria: Secondary | ICD-10-CM | POA: Diagnosis not present

## 2021-05-14 NOTE — Progress Notes (Signed)
05/14/2021 9:13 AM   [name] 1946/02/10 [id number]  CC: Chief Complaint  Patient presents with   Dysuria   HPI: Maria Spencer is a 75 y.o. female with PMH recurrent UTI on daily suppressive Keflex, cystocele, and breast cancer who presents today for evaluation of possible UTI.   Today she reports an approximate 6-day history of dysuria and lower abdominal pain consistent with past episodes of cystitis.  She has been taking 2 doses of Keflex daily for the past 3 days and notes symptomatic improvement on this.  She also took Azo, most recently 3 days ago.  She notes that her symptoms are resolving with this therapy, however she has developed some vulvovaginal itching and has been using vaginocele cream for this.  She also purchased a Monistat kit but has not yet started this.  In-office UA and microscopy today pan negative.  PMH: Past Medical History:  Diagnosis Date   Arthritis    neck, back, left knee;    Breast cancer ([place]) 2009   right lumpectomy s/p radiation x 1 week 2x per day only    Chicken pox    Chronic UTI    established with urology    COVID-19    ? 12/2020 sob    Diverticular disease    -osis and -itis    Esophagitis    egd 05/17/14 see report scanned into chart   History of kidney stones    Hormone disorder    Hyperlipidemia    Hypertension    controlled well with medication;    Hypothyroidism    Osteoporosis    Personal history of radiation therapy 2009   F/U right breast cancer   Thyroid disease    hypothyroidism     Surgical History: Past Surgical History:  Procedure Laterality Date   BONE GRAFT HIP ILIAC CREST     + cage left hip 10/23/17    BREAST BIOPSY Left 2009   clip,benign   BREAST BIOPSY Right 2009   +   BREAST LUMPECTOMY Right 2009   2009 lumpectomy    CARDIAC CATHETERIZATION     No stents; [facility] doesn't recall facility +67yr ago   CATARACT EXTRACTION, BILATERAL Bilateral 10/2019   COLONOSCOPY WITH PROPOFOL N/A  05/15/2020   Procedure: COLONOSCOPY WITH PROPOFOL;  Surgeon: VLin Landsman MD;  [facility]  Service: Gastroenterology;  Laterality: N/A;   [place]   Procedure: INSERTION OF MESH;  Surgeon: MJohnathan Hausen MD;  Location: [facility];  Service: General;  Laterality: N/A;   JOINT REPLACEMENT     QUADRICEPS TENDON REPAIR Right 12/05/2020   Procedure: OPEN REPAIR OF RIGHT GLUTEUS MINIMUS TENDON;  Surgeon: PCorky Mull MD;  Location: [facility];  Service: Orthopedics;  Laterality: Right;   right gluteus medius/minimus repair Dr. PRoland Rack1/2022      Dr. PRoland Rack  SHOULDER SURGERY Left 2008, 2009   x2 rotator cuff surgeries left shoulders   SPINAL FUSION  07/24/2020   SPINE SURGERY  10/23/2017   spinal 09/2017 h/o scoliosis [facility] L4-S1 OLIF T10 to ililum fusion   TONSILLECTOMY     age 89y.o.    TOTAL SHOULDER REPLACEMENT Right 2012   TUBAL LIGATION     VENTRAL HERNIA REPAIR N/A 05/29/2018   Procedure: LAPAROSCOPIC VENTRAL / LATERAL HERNIA REPAIR;  Surgeon: MJohnathan Hausen MD;  [facility];  Service: General;  Laterality: N/A;    Home Medications:  Allergies as of 05/14/2021       Reactions   Sulfa Antibiotics Anaphylaxis, Hives, Itching, Shortness Of Breath, Swelling   Lips & throat swelled   Nitrofuran Derivatives Swelling   Lip swelling; "eyes get red and puffy"   Amlodipine Besylate Other (See Comments)   Gas, bloating  Gas, bloating  Heart racing    Nitrofurantoin Hives, Other (See Comments)        Medication List        Accurate as of May 14, 2021  9:13 AM. If you have any questions, ask your nurse or doctor.          atorvastatin 20 MG tablet Commonly known as: LIPITOR Take 1 tablet (20 mg total) by mouth every other day. D/c 10 mg pill increased dose   cephALEXin 250 MG capsule Commonly known as: Keflex Take 1 capsule (250 mg total) by mouth 2 (two)  times daily.   [employer] CALCIUM 600 & VITAMIN D3 PO Take 1 tablet by mouth daily.   fluticasone 50 MCG/ACT nasal spray Commonly known as: FLONASE Place 2 sprays into both nostrils daily. Max b/l nostrils   levothyroxine 88 MCG tablet Commonly known as: SYNTHROID Take 1 tablet (88 mcg total) by mouth daily before breakfast. 30 minutes   metoprolol succinate 50 MG 24 hr tablet Commonly known as: TOPROL-XL Take 1 tablet (50 mg total) by mouth daily. Take with or immediately following a meal.   Multi-Vitamins Tabs Take 1 tablet by mouth daily.   telmisartan 80 MG tablet Commonly known as: Micardis Take 1 tablet (80 mg total) by mouth daily. In place of losartan 100. Take 1/2 pill day 3 days and if BP >130/>80 take 1 pill day 4 and beyond daily   vitamin B-12 1000 MCG tablet Commonly known as: CYANOCOBALAMIN Take 1,000 mcg by mouth daily.   zolpidem 5 MG tablet Commonly known as: AMBIEN Take 1 tablet (5 mg total) by mouth at bedtime as needed for sleep.        Allergies:  Allergies  Allergen Reactions   Sulfa Antibiotics Anaphylaxis, Hives, Itching, Shortness Of Breath and Swelling    Lips & throat swelled   Nitrofuran Derivatives Swelling    Lip swelling; "eyes get red and puffy"   Amlodipine Besylate Other (See Comments)    Gas, bloating   Gas, bloating  Heart racing    Nitrofurantoin Hives and Other (See Comments)    Family History: Family History  Problem Relation Age of Onset   Arthritis Mother    Heart disease Mother 78       stent    Hyperlipidemia Mother    Hypertension Mother    Coronary artery disease Mother 36   Arthritis Father    Diabetes Father    Cancer Father        colon   AAA (abdominal aortic aneurysm) Father    Arthritis Sister    Hyperlipidemia Sister    Hypertension Sister    Cancer Brother        lung, smoker   Mental retardation Brother    Drug abuse Daughter        overdose in 2018    Arthritis Sister    Hyperlipidemia Sister     Bladder Cancer Neg Hx    Kidney cancer Neg Hx    Breast cancer Neg Hx     Social History:   reports that she has  never smoked. She has never used smokeless tobacco. She reports previous alcohol use of about 7.0 standard drinks of alcohol per week. She reports that she does not use drugs.  Physical Exam: BP (!) 164/89   Pulse 67   Ht 4' 11" (1.499 m)   Wt 128 lb (58.1 kg)   BMI 25.85 kg/m   Constitutional:  Alert and oriented, no acute distress, nontoxic appearing HEENT: [place], AT Cardiovascular: No clubbing, cyanosis, or edema Respiratory: Normal respiratory effort, no increased work of breathing Skin: No rashes, bruises or suspicious lesions Neurologic: Grossly intact, no focal deficits, moving all 4 extremities Psychiatric: Normal mood and affect  Laboratory Data: Results for orders placed or performed in visit on 05/14/21  Microscopic Examination   Urine  Result Value Ref Range   WBC, UA 0-5 0 - 5 /hpf   RBC 0-2 0 - 2 /hpf   Epithelial Cells (non renal) 0-10 0 - 10 /hpf   Bacteria, UA None seen None seen/Few  Urinalysis, Complete  Result Value Ref Range   Specific Gravity, UA 1.020 1.005 - 1.030   pH, UA 6.0 5.0 - 7.5   Color, UA Yellow Yellow   Appearance Ur Clear Clear   Leukocytes,UA Negative Negative   Protein,UA Negative Negative/Trace   Glucose, UA Negative Negative   Ketones, UA Negative Negative   RBC, UA Negative Negative   Bilirubin, UA Negative Negative   Urobilinogen, Ur 0.2 0.2 - 1.0 mg/dL   Nitrite, UA Negative Negative   Microscopic Examination See below:    Assessment & Plan:   1. Dysuria UA benign today in the setting of therapeutic Keflex course x3 days.  Patient reports symptomatic improvement with this.  Counseled her to continue twice daily Keflex for 2 more days and take the Monistat kit she has at home for possible secondary vulvovaginal candidiasis.  Will send urine for culture and treat as indicated.  Counseled her to follow-up in clinic  with me later this week if her dysuria or vulvovaginal pruritus persist.  She expressed understanding. - Urinalysis, Complete - CULTURE, URINE COMPREHENSIVE   Return if symptoms worsen or fail to improve.  Debroah Loop, PA-C  [facility] [address] [place], [place] [address] [phone]

## 2021-05-14 NOTE — Patient Instructions (Signed)
Continue twice daily Keflex today and tomorrow, then go back to your normal once daily dose. Go ahead and start the Monistat kit you have at home. If you are still having burning or itching by the end of the week, call our office and I'll see you again.

## 2021-05-15 LAB — MICROSCOPIC EXAMINATION: Bacteria, UA: NONE SEEN

## 2021-05-15 LAB — URINALYSIS, COMPLETE
Bilirubin, UA: NEGATIVE
Glucose, UA: NEGATIVE
Ketones, UA: NEGATIVE
Leukocytes,UA: NEGATIVE
Nitrite, UA: NEGATIVE
Protein,UA: NEGATIVE
RBC, UA: NEGATIVE
Specific Gravity, UA: 1.02 (ref 1.005–1.030)
Urobilinogen, Ur: 0.2 mg/dL (ref 0.2–1.0)
pH, UA: 6 (ref 5.0–7.5)

## 2021-05-15 NOTE — Telephone Encounter (Signed)
Informed by pharmacy that Patient's plan only covers 90 pills a year.   Prior authorization has been submitted for patient's Ambien  Awaiting approval or denial.

## 2021-05-17 LAB — CULTURE, URINE COMPREHENSIVE

## 2021-05-18 NOTE — Telephone Encounter (Signed)
Denied. This drug is not covered on the formulary. We are denying your request because we do not show that you have tried at least 2 covered drugs that can treat your condition. <>. Other covered drug(s) is/are: <>. We may be able to make an exception to cover this drug. Your doctor will need to send [place] medical records showing that you tried this drug. If you cannot take the covered drug, your doctor will need to tell us why. Note: Some covered drug(s) may have quantity limits. Please refer to the formulary for details.  Please advise

## 2021-05-21 ENCOUNTER — Telehealth: Payer: Self-pay

## 2021-05-21 NOTE — Telephone Encounter (Signed)
Pt is requesting to have one more knee injection before Medicare can approve her Nerve block in the knee. Medicare is stating she needs 2 knee injections before they will approve and cover the cost of the Nerve Block Please contact patient on how she can get this scheduled she wants to avoid having knee surgery.

## 2021-05-21 NOTE — Telephone Encounter (Signed)
Message sent to Dr Naveira 

## 2021-05-21 NOTE — Telephone Encounter (Signed)
Dr Dossie Arbour, spoke with patient and she is wanting a nerve block in her knee.  She had her first injection from Dr Sharlet Salina in May.  Their equipment is broken and will not be fixed until August.  She asked could you do it and Dr Sharlet Salina states that if you do it, she can no longer go to him.  I will try to get a virtual appointment scheduled. Just wanted to let you know the situation ahead of time.

## 2021-05-22 NOTE — Telephone Encounter (Signed)
Please schedule an appointment with Dr Dossie Arbour for an eval.  He has not done knee injections on her before and needs to evaluate her pain.

## 2021-05-28 ENCOUNTER — Other Ambulatory Visit: Payer: Self-pay | Admitting: Internal Medicine

## 2021-05-28 DIAGNOSIS — G4700 Insomnia, unspecified: Secondary | ICD-10-CM

## 2021-05-28 MED ORDER — ZOLPIDEM TARTRATE 5 MG PO TABS: 5.0000 mg | ORAL_TABLET | Freq: Every evening | ORAL | 1 refills | Status: DC | PRN

## 2021-05-28 NOTE — Telephone Encounter (Signed)
Inform pt she can call her insurance and see what they will cover for sleep at night and let us know

## 2021-05-29 NOTE — Telephone Encounter (Signed)
Doxepin HCL 3 mg tablet, 6 mg tablet,  Eszopiclone 1,2, 3 mg tablets and PA will be needed.  Temazepam 15, 30 mg oral capsule PA will be needed  Temazepam 7.5 mg capsule. PA will be required Trazodone HCL oral tablet 100, 150, 50 mg no PA required.   Spoke with patient and states the above are covered.

## 2021-05-30 ENCOUNTER — Ambulatory Visit: Payer: Medicare Other | Admitting: Internal Medicine

## 2021-05-30 ENCOUNTER — Other Ambulatory Visit: Payer: Self-pay | Admitting: Internal Medicine

## 2021-05-30 DIAGNOSIS — G4700 Insomnia, unspecified: Secondary | ICD-10-CM

## 2021-05-30 DIAGNOSIS — F4321 Adjustment disorder with depressed mood: Secondary | ICD-10-CM

## 2021-05-30 MED ORDER — TRAZODONE HCL 50 MG PO TABS
25.0000 mg | ORAL_TABLET | Freq: Every evening | ORAL | 0 refills | Status: DC | PRN
Start: 2021-05-30 — End: 2021-07-03

## 2021-05-30 NOTE — Telephone Encounter (Signed)
Patient informed and verbalized understanding

## 2021-05-30 NOTE — Telephone Encounter (Signed)
Sent trazadone for sleep 25-50 mg at night as needed for sleep we have to try this insurance will not cover other options

## 2021-06-11 ENCOUNTER — Ambulatory Visit
Admission: RE | Admit: 2021-06-11 | Discharge: 2021-06-11 | Disposition: A | Discharge status: Home / Self Care | Payer: Medicare Other | Source: Ambulatory Visit | Attending: Internal Medicine | Admitting: Internal Medicine

## 2021-06-11 ENCOUNTER — Other Ambulatory Visit: Payer: Self-pay

## 2021-06-11 DIAGNOSIS — Z1231 Encounter for screening mammogram for malignant neoplasm of breast: Secondary | ICD-10-CM | POA: Diagnosis not present

## 2021-06-11 IMAGING — MG MM DIGITAL SCREENING BILAT W/ TOMO AND CAD
6 of 10 series · 6 of 30 positions shown · non-contrast
Comparison: Previous exam(s).

CLINICAL DATA: Screening.

EXAM:
DIGITAL SCREENING BILATERAL MAMMOGRAM WITH TOMOSYNTHESIS AND CAD
TECHNIQUE: Bilateral screening digital craniocaudal and mediolateral oblique
mammograms were obtained. Bilateral screening digital breast
tomosynthesis was performed. The images were evaluated with
computer-aided detection.

[R MLO synth-2D (1 of 2)]
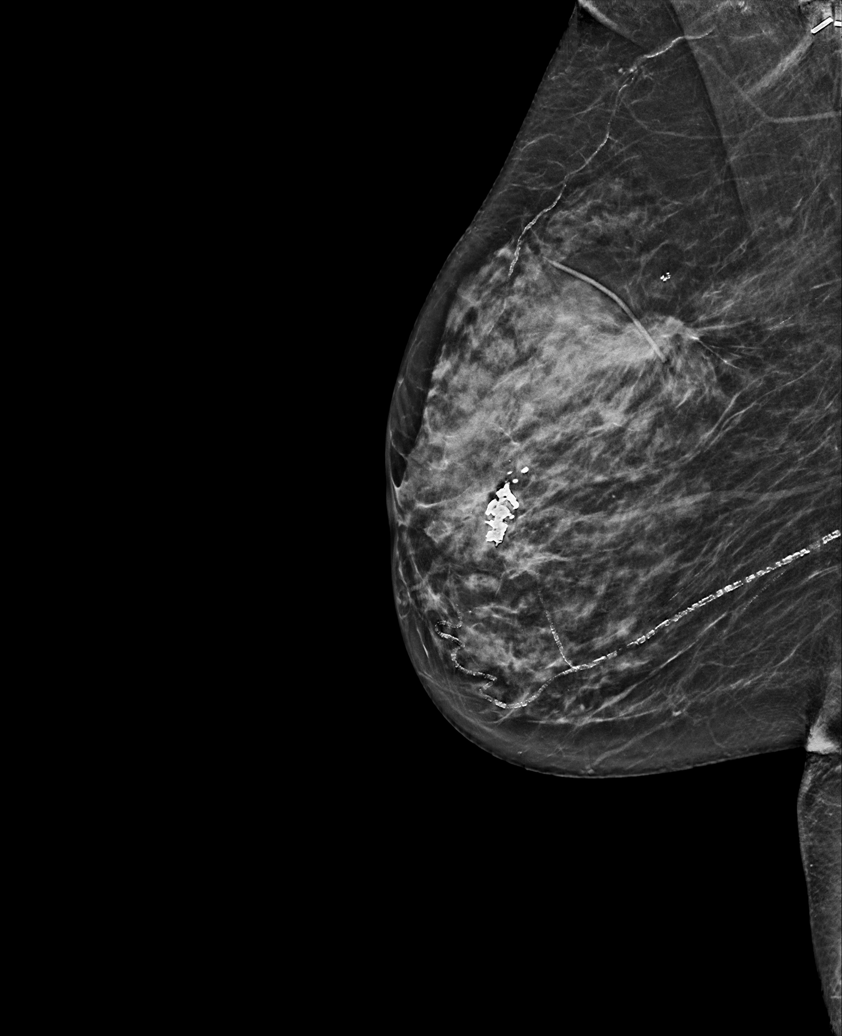

[L CC synth-2D]
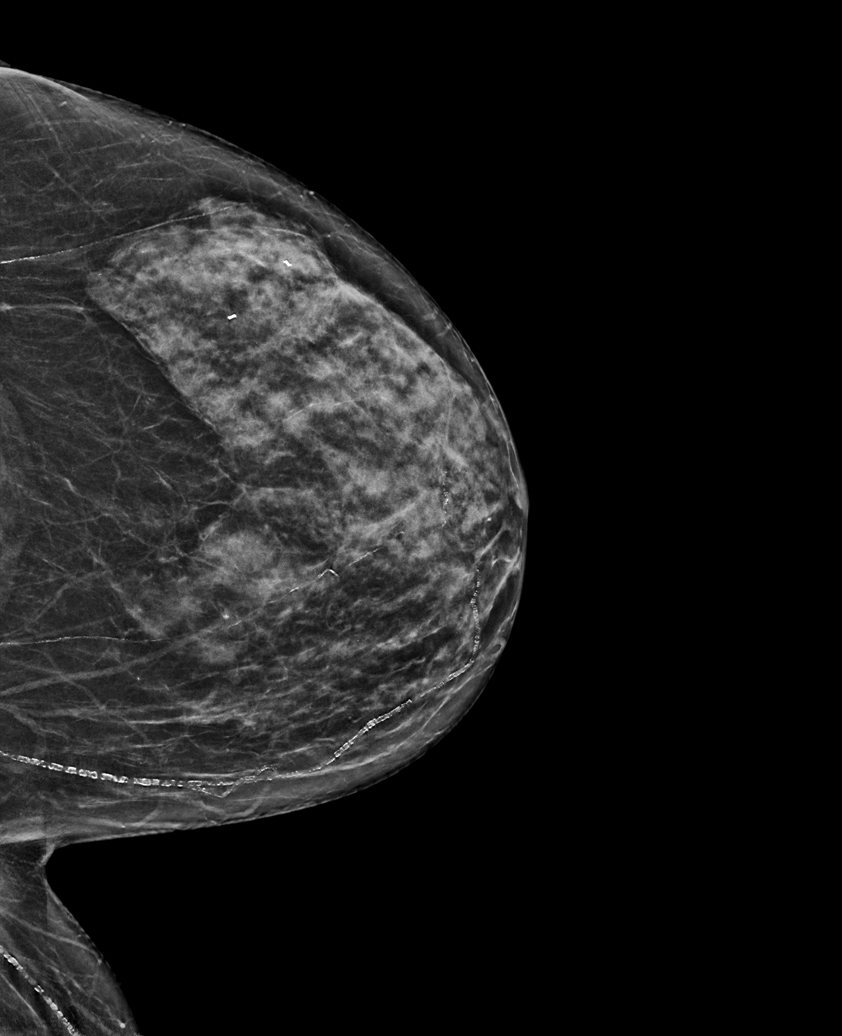

[L MLO synth-2D]
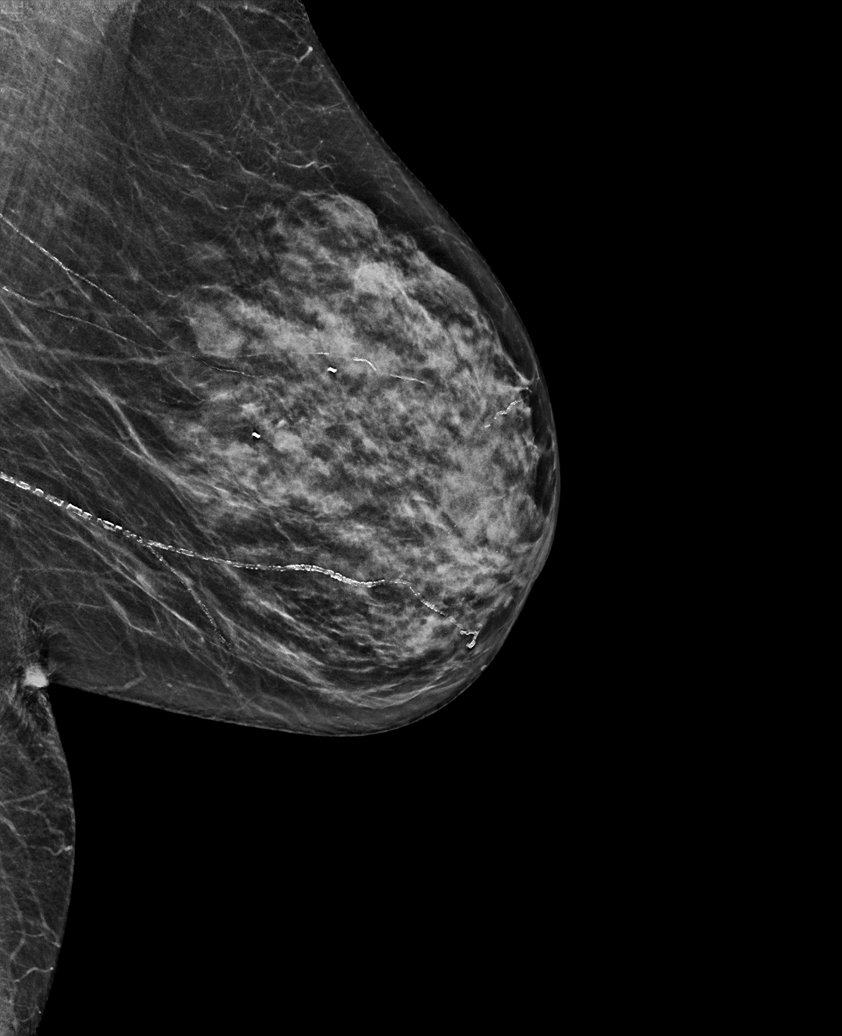

[R MLO synth-2D (2 of 2)]
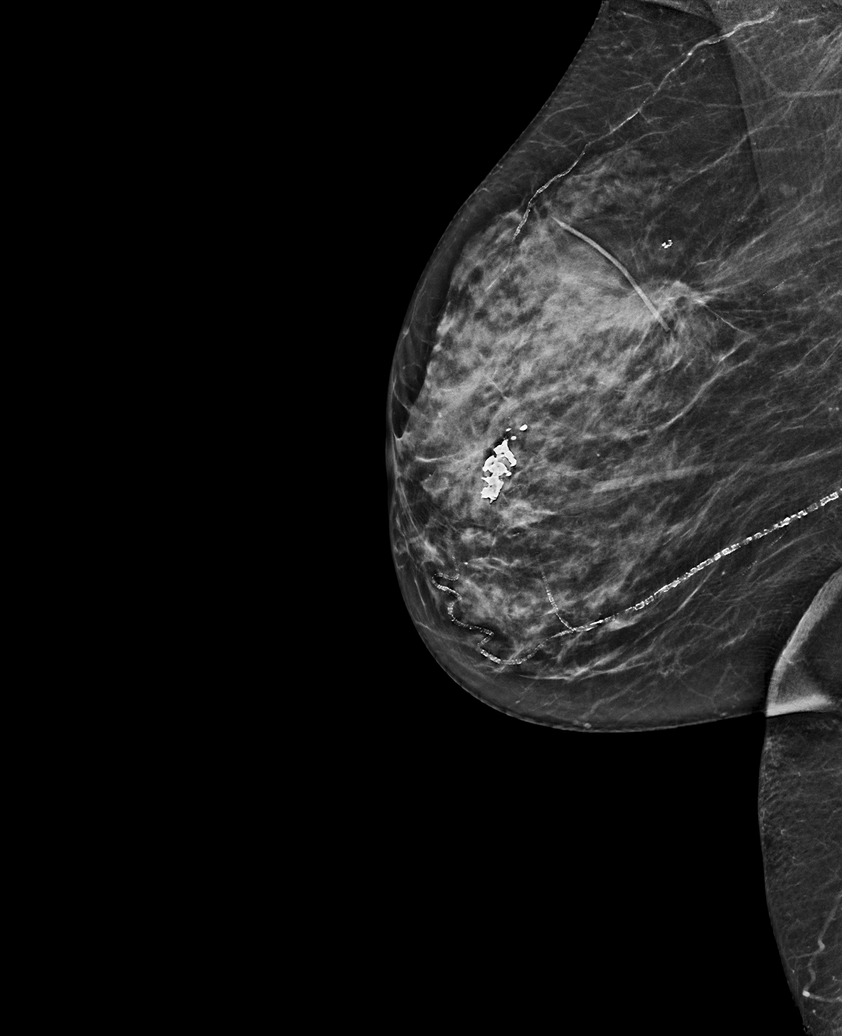

[R CC synth-2D]
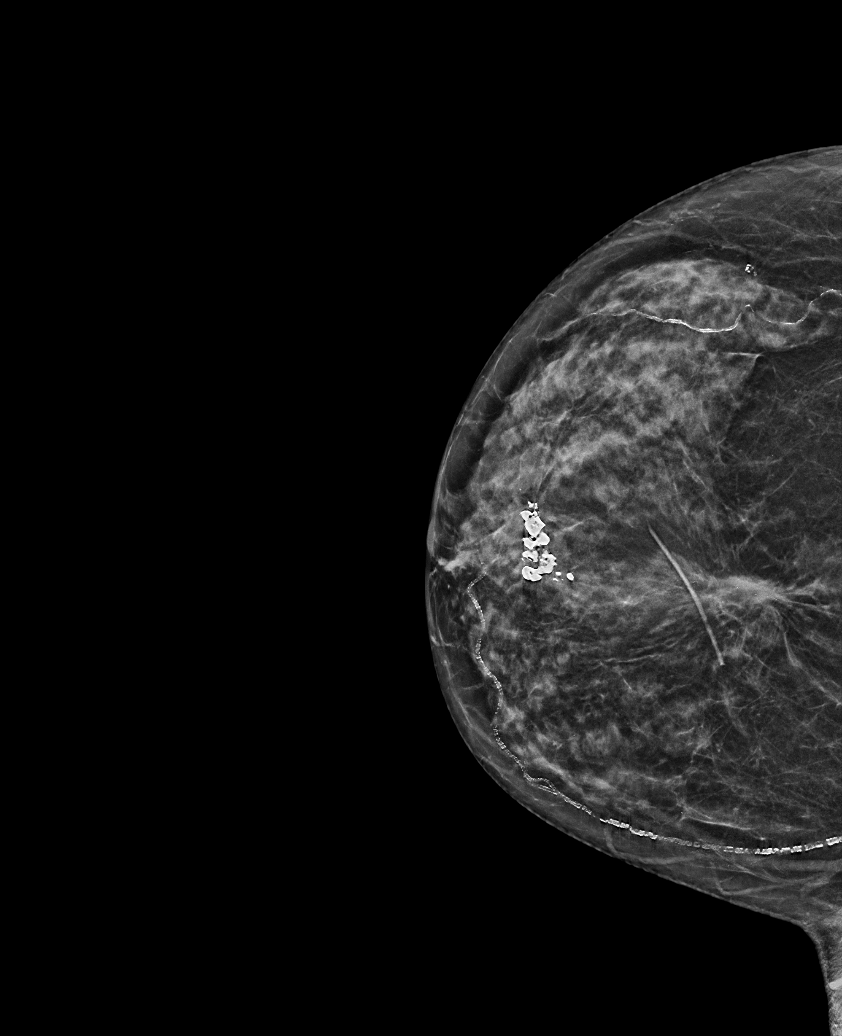

[L MLO tomo · tomo slice 29/57.0]
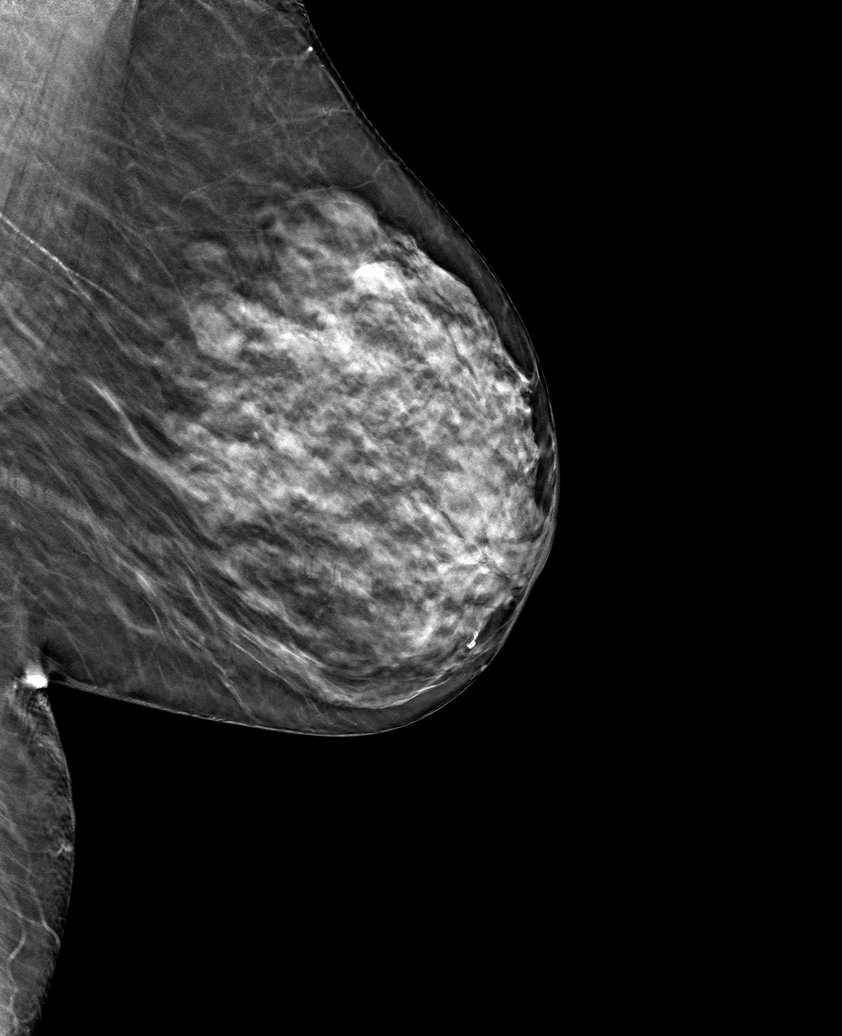

[6 of 30 positions shown; findings below may reference images not displayed]

ACR Breast Density Category d: The breast tissue is extremely dense,
which lowers the sensitivity of mammography
FINDINGS: There are no findings suspicious for malignancy.
IMPRESSION: No mammographic evidence of malignancy. A result letter of this
screening mammogram will be mailed directly to the patient.

RECOMMENDATION:
Screening mammogram in one year. (Code:[7P])

BI-RADS CATEGORY  1: Negative.

## 2021-06-18 DIAGNOSIS — M4327 Fusion of spine, lumbosacral region: Secondary | ICD-10-CM | POA: Diagnosis not present

## 2021-06-18 DIAGNOSIS — S76011D Strain of muscle, fascia and tendon of right hip, subsequent encounter: Secondary | ICD-10-CM | POA: Diagnosis not present

## 2021-06-18 DIAGNOSIS — M1712 Unilateral primary osteoarthritis, left knee: Secondary | ICD-10-CM | POA: Diagnosis not present

## 2021-06-19 ENCOUNTER — Encounter: Payer: Self-pay | Admitting: Pain Medicine

## 2021-06-19 ENCOUNTER — Ambulatory Visit: Payer: Medicare Other | Admitting: Surgery

## 2021-06-20 ENCOUNTER — Ambulatory Visit: Payer: Medicare Other | Attending: Pain Medicine | Admitting: Pain Medicine

## 2021-06-20 ENCOUNTER — Other Ambulatory Visit: Payer: Self-pay

## 2021-06-20 DIAGNOSIS — G8929 Other chronic pain: Secondary | ICD-10-CM

## 2021-06-20 DIAGNOSIS — M25562 Pain in left knee: Secondary | ICD-10-CM

## 2021-06-20 DIAGNOSIS — M7122 Synovial cyst of popliteal space [Baker], left knee: Secondary | ICD-10-CM | POA: Diagnosis not present

## 2021-06-20 DIAGNOSIS — M1712 Unilateral primary osteoarthritis, left knee: Secondary | ICD-10-CM | POA: Diagnosis not present

## 2021-06-20 NOTE — Patient Instructions (Signed)
______________________________________________________________________  Preparing for your procedure (without sedation)  Procedure appointments are limited to planned procedures: No Prescription Refills. No disability issues will be discussed. No medication changes will be discussed.  Instructions: Oral Intake: Do not eat or drink anything for at least 6 hours prior to your procedure. (Exception: Blood Pressure Medication. See below.) Transportation: Unless otherwise stated by your physician, you may drive yourself after the procedure. Blood Pressure Medicine: Do not forget to take your blood pressure medicine with a sip of water the morning of the procedure. If your Diastolic (lower reading)is above 100 mmHg, elective cases will be cancelled/rescheduled. Blood thinners: These will need to be stopped for procedures. Notify our staff if you are taking any blood thinners. Depending on which one you take, there will be specific instructions on how and when to stop it. Diabetics on insulin: Notify the staff so that you can be scheduled 1st case in the morning. If your diabetes requires high dose insulin, take only  of your normal insulin dose the morning of the procedure and notify the staff that you have done so. Preventing infections: Shower with an antibacterial soap the morning of your procedure.  Build-up your immune system: Take 1000 mg of Vitamin C with every meal (3 times a day) the day prior to your procedure. Antibiotics: Inform the staff if you have a condition or reason that requires you to take antibiotics before dental procedures. Pregnancy: If you are pregnant, call and cancel the procedure. Sickness: If you have a cold, fever, or any active infections, call and cancel the procedure. Arrival: You must be in the facility at least 30 minutes prior to your scheduled procedure. Children: Do not bring any children with you. Dress appropriately: Bring dark clothing that you would not mind  if they get stained. Valuables: Do not bring any jewelry or valuables.  Reasons to call and reschedule or cancel your procedure: (Following these recommendations will minimize the risk of a serious complication.) Surgeries: Avoid having procedures within 2 weeks of any surgery. (Avoid for 2 weeks before or after any surgery). Flu Shots: Avoid having procedures within 2 weeks of a flu shots or . (Avoid for 2 weeks before or after immunizations). Barium: Avoid having a procedure within 7-10 days after having had a radiological study involving the use of radiological contrast. (Myelograms, Barium swallow or enema study). Heart attacks: Avoid any elective procedures or surgeries for the initial 6 months after a "Myocardial Infarction" (Heart Attack). Blood thinners: It is imperative that you stop these medications before procedures. Let us know if you if you take any blood thinner.  Infection: Avoid procedures during or within two weeks of an infection (including chest colds or gastrointestinal problems). Symptoms associated with infections include: Localized redness, fever, chills, night sweats or profuse sweating, burning sensation when voiding, cough, congestion, stuffiness, runny nose, sore throat, diarrhea, nausea, vomiting, cold or Flu symptoms, recent or current infections. It is specially important if the infection is over the area that we intend to treat. Heart and lung problems: Symptoms that may suggest an active cardiopulmonary problem include: cough, chest pain, breathing difficulties or shortness of breath, dizziness, ankle swelling, uncontrolled high or unusually low blood pressure, and/or palpitations. If you are experiencing any of these symptoms, cancel your procedure and contact your primary care physician for an evaluation.  Remember:  Regular Business hours are:  Monday to Thursday 8:00 AM to 4:00 PM  Provider's Schedule: Arina Torry, MD:  Procedure days: Tuesday and Thursday    7:30 AM to 4:00 PM  Bilal Lateef, MD:  Procedure days: Monday and Wednesday 7:30 AM to 4:00 PM ______________________________________________________________________  ____________________________________________________________________________________________  General Risks and Possible Complications  Patient Responsibilities: It is important that you read this as it is part of your informed consent. It is our duty to inform you of the risks and possible complications associated with treatments offered to you. It is your responsibility as a patient to read this and to ask questions about anything that is not clear or that you believe was not covered in this document.  Patient's Rights: You have the right to refuse treatment. You also have the right to change your mind, even after initially having agreed to have the treatment done. However, under this last option, if you wait until the last second to change your mind, you may be charged for the materials used up to that point.  Introduction: Medicine is not an exact science. Everything in Medicine, including the lack of treatment(s), carries the potential for danger, harm, or loss (which is by definition: Risk). In Medicine, a complication is a secondary problem, condition, or disease that can aggravate an already existing one. All treatments carry the risk of possible complications. The fact that a side effects or complications occurs, does not imply that the treatment was conducted incorrectly. It must be clearly understood that these can happen even when everything is done following the highest safety standards.  No treatment: You can choose not to proceed with the proposed treatment alternative. The "PRO(s)" would include: avoiding the risk of complications associated with the therapy. The "CON(s)" would include: not getting any of the treatment benefits. These benefits fall under one of three categories: diagnostic; therapeutic; and/or palliative.  Diagnostic benefits include: getting information which can ultimately lead to improvement of the disease or symptom(s). Therapeutic benefits are those associated with the successful treatment of the disease. Finally, palliative benefits are those related to the decrease of the primary symptoms, without necessarily curing the condition (example: decreasing the pain from a flare-up of a chronic condition, such as incurable terminal cancer).  General Risks and Complications: These are associated to most interventional treatments. They can occur alone, or in combination. They fall under one of the following six (6) categories: no benefit or worsening of symptoms; bleeding; infection; nerve damage; allergic reactions; and/or death. No benefits or worsening of symptoms: In Medicine there are no guarantees, only probabilities. No healthcare provider can ever guarantee that a medical treatment will work, they can only state the probability that it may. Furthermore, there is always the possibility that the condition may worsen, either directly, or indirectly, as a consequence of the treatment. Bleeding: This is more common if the patient is taking a blood thinner, either prescription or over the counter (example: Goody Powders, Fish oil, Aspirin, Garlic, etc.), or if suffering a condition associated with impaired coagulation (example: Hemophilia, cirrhosis of the liver, low platelet counts, etc.). However, even if you do not have one on these, it can still happen. If you have any of these conditions, or take one of these drugs, make sure to notify your treating physician. Infection: This is more common in patients with a compromised immune system, either due to disease (example: diabetes, cancer, human immunodeficiency virus [HIV], etc.), or due to medications or treatments (example: therapies used to treat cancer and rheumatological diseases). However, even if you do not have one on these, it can still happen. If you  have any of these conditions, or take   one of these drugs, make sure to notify your treating physician. Nerve Damage: This is more common when the treatment is an invasive one, but it can also happen with the use of medications, such as those used in the treatment of cancer. The damage can occur to small secondary nerves, or to large primary ones, such as those in the spinal cord and brain. This damage may be temporary or permanent and it may lead to impairments that can range from temporary numbness to permanent paralysis and/or brain death. Allergic Reactions: Any time a substance or material comes in contact with our body, there is the possibility of an allergic reaction. These can range from a mild skin rash (contact dermatitis) to a severe systemic reaction (anaphylactic reaction), which can result in death. Death: In general, any medical intervention can result in death, most of the time due to an unforeseen complication. ____________________________________________________________________________________________  

## 2021-06-20 NOTE — Progress Notes (Signed)
Patient: Maria Spencer  Service Category: E/M  Provider: Gaspar Cola, MD  DOB: [birth year]  DOS: 06/20/2021  Location: Office  MRN: [id number]  Setting: Ambulatory outpatient  Referring Provider: McLean-Scocuzza, Maria Mackie *  Type: Established Patient  Specialty: Interventional Pain Management  PCP: Spencer, Maria Glow, MD  Location: Remote location  Delivery: TeleHealth     Virtual Encounter - Pain Management PROVIDER NOTE: Information contained herein reflects review and annotations entered in association with encounter. Interpretation of such information and data should be left to medically-trained personnel. Information provided to patient can be located elsewhere in the medical record under "Patient Instructions". Document created using STT-dictation technology, any transcriptional errors that may result from process are unintentional.    Contact & Pharmacy Preferred: [place]: [phone] (home) Mobile: [phone] (mobile) E-mail: [email or web address]  [id number] [place] - [address]: [phone]: [phone] [facility] [place]: [phone]: [phone]  Pre-screening  Maria Spencer offered "in-person" vs "virtual" encounter. She indicated preferring virtual for this encounter.   Reason COVID-19*  Social distancing based on CDC and AMA recommendations.   I contacted Maria Spencer 06/20/2021 via telephone.      I clearly identified myself as FGaspar Cola MD. I verified that I was speaking with the correct person using two identifiers (Name: Maria Spencer and date of birth: [birth year].  Consent I sought verbal advanced consent from Maria Spencer virtual visit interactions. I informed Maria Spencer of possible security and privacy concerns,  risks, and limitations associated with providing "not-in-person" medical evaluation and management services. I also informed Maria Spencer of the availability of "in-person" appointments. Finally, I informed her that there would be a charge for the virtual visit and that she could be  personally, fully or partially, financially responsible for it. Ms. BLyeexpressed understanding and agreed to proceed.   Historic Elements   Ms. PGeniya Fulghamis a 75y.o. year old, female patient evaluated today after our last contact on Visit date not found. Maria Spencer has a past medical history of Arthritis, [place] Chicken pox, Chronic UTI, COVID-19, Diverticular disease, Esophagitis, History of kidney stones, Hormone disorder, Hyperlipidemia, Hypertension, Hypothyroidism, Osteoporosis, Personal history of radiation therapy (2009), and Thyroid disease. She also  has a past surgical history that includes Total shoulder replacement (Right, 2012); Shoulder surgery (Left, 2008, 2009); Tonsillectomy; Bone graft hip iliac crest; Spine surgery (10/23/2017); Ventral hernia repair (N/A, 05/29/2018); Insertion of mesh (N/A, 05/29/2018); Eye surgery; Cardiac catheterization; Breast lumpectomy (Right, 2009); Breast biopsy (Left, 2009); Breast biopsy (Right, 2009); Colonoscopy with propofol (N/A, 05/15/2020); Spinal fusion (07/24/2020); Joint replacement; Hernia repair; Cataract extraction, bilateral (Bilateral, 10/2019); Tubal ligation; Quadriceps tendon repair (Right, 12/05/2020); and right gluteus medius/minimus repair Dr. PRoland Rack1/2022 . [name] has a current medication list which includes the following prescription(s): atorvastatin, calcium carb-cholecalciferol, cephalexin, fluticasone, levothyroxine, metoprolol succinate, multi-vitamins, telmisartan, trazodone, vitamin b-12, and zolpidem. She  reports that she has never smoked. She has never used smokeless tobacco. She reports previous alcohol use of about 7.0 standard  drinks of alcohol per week. She reports that she does not use drugs. Ms. [place] allergic to sulfa antibiotics, nitrofuran derivatives, amlodipine besylate, and nitrofurantoin.   HPI  Today, she is being contacted for  evaluation for possible knee injections. No x-rays  of the knees available.  In the future I will need to address the issue of patient requesting removal of multiple diagnoses.  The patient indicates that she has been having problems with her left knee for quite some time.  Part of the issue that I have with her is that she has been going to multiple physicians to get injections and therefore the information is not readily available at 1 particular place.  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 Roland Rack who did some hip injections.  She later went back to his practice because the abnormality of her gait was causing some knee pain and this is where she had some injections done by [name], PA.  Apparently she recently saw Dr. Roland Rack who recommended surgery, but she stated that she wants to stay away from any further surgeries due to the experience from the recent 1.  He then suggested that she do a genicular nerve block and follow-up with a cool leaf procedure to provide her with longer lasting benefit.  She asked me if I was familiar with the process and I have explained to her that we have been doing genicular nerve blocks for many years and although we do have the equipment to do cool leaf procedures, our experience with the equipment let us to switch to radiofrequency ablation which carries a much lower cost, takes a lot less time to perform, and use this a much smaller needle making it a lot more comfortable for the patient to have the procedure done.  I have explained all this to the patient and she indicated that she had been sent to Dr. Sharlet Salina or apparently did a left-sided genicular nerve block on 04/18/2021, but  could not complete process of doing a second diagnostic injection or the cool leaf because "their equipment broke".  This is when she decided to get [place] involved in the process.  Today I had the opportunity to review the note from Dr. Sharlet Salina and she indicated that she attained 1-1/2 days of relief from the diagnostic injection and according to my review he used 1 mL of 0.5% Marcaine and no steroid and therefore the results that she described were appropriate for the procedure that he did.  I told the patient that I could do the diagnostic injection with or without steroids and I did explain to her the benefits of doing it with steroids, but this is when she then informed me that she had just received another steroid injection by Dr. Roland Rack on 06/18/2021.  I explained to her that typically we would like to have at least 2 weeks between steroid injections and that her option included having the diagnostic injection without the steroids and she chose to go that route.  Maria Spencer has been a patient of this clinic since 05/08/2020.  Since then, according to my review of the records the patient has had the following procedures done elsewhere: Lattie Corns, PA: "Large joint injection" (09/06/2020, 11/06/2020, 12/18/2020) Hezzie Bump, MD: "Large joint injection" (03/05/2021, 06/18/2021)  At this point, the plan is to have the patient return for a diagnostic left-sided genicular nerve block without steroids, which if effective we will then move onto a radiofrequency ablation.  All of this was explained to the patient including the risk and possible complications as well as a detailed description of the procedure which she understood and accepted.  She requested to  have the procedure done without sedation since she does not have a driver.  Pharmacotherapy Assessment   Analgesic: No opioid analgesics prescribed by our practice. Highest recorded MME/day: 75 mg/day MME/day: 0 mg/day   Monitoring: [place] PMP:  PDMP reviewed during this encounter.       Pharmacotherapy: No side-effects or adverse reactions reported. Compliance: No problems identified. Effectiveness: Clinically acceptable. Plan: Refer to "POC". UDS: No results found for: SUMMARY   Laboratory Chemistry Profile   Renal Lab Results  Component Value Date   BUN <5 (L) 03/19/2021   CREATININE 0.72 03/19/2021   BCR 28 (H) 09/10/2019   GFR 90.84 02/06/2021   GFRAA >60 05/08/2020   GFRNONAA >60 03/19/2021    Hepatic Lab Results  Component Value Date   AST 22 03/15/2021   ALT 15 03/15/2021   ALBUMIN 3.7 03/15/2021   ALKPHOS 84 03/15/2021   LIPASE 26 03/15/2021    Electrolytes Lab Results  Component Value Date   NA 138 03/19/2021   K 3.8 03/19/2021   CL 102 03/19/2021   CALCIUM 8.8 (L) 03/19/2021   MG 2.0 05/08/2020    Bone Lab Results  Component Value Date   VD25OH 113.74 (HH) 02/06/2021    Inflammation (CRP: Acute Phase) (ESR: Chronic Phase) Lab Results  Component Value Date   CRP 0.6 05/08/2020   ESRSEDRATE 5 05/08/2020   LATICACIDVEN 0.7 03/15/2021         Note: Above Lab results reviewed.  Imaging  MM 3D SCREEN BREAST BILATERAL CLINICAL DATA:  Screening.  EXAM: DIGITAL SCREENING BILATERAL MAMMOGRAM WITH TOMOSYNTHESIS AND CAD  TECHNIQUE: Bilateral screening digital craniocaudal and mediolateral oblique mammograms were obtained. Bilateral screening digital breast tomosynthesis was performed. The images were evaluated with computer-aided detection.  COMPARISON:  Previous exam(s).  ACR Breast Density Category d: The breast tissue is extremely dense, which lowers the sensitivity of mammography  FINDINGS: There are no findings suspicious for malignancy.  IMPRESSION: No mammographic evidence of malignancy. A result letter of this screening mammogram will be mailed directly to the patient.  RECOMMENDATION: Screening mammogram in one year. (Code:[id number]-B-01Y)  BI-RADS CATEGORY  1:  Negative.  Electronically Signed   By: Ammie Ferrier M.D.   On: 06/12/2021 14:45  Assessment  The primary encounter diagnosis was Chronic knee pain (4th area of Pain) (Posterior) (Left). Diagnoses of Osteoarthritis of knee (Left) and Baker's cyst of knee (Left) were also pertinent to this visit.  Plan of Care  Problem-specific:  No problem-specific Assessment & Plan notes found for this encounter.  Maria Spencer has a current medication list which includes the following long-term medication(s): atorvastatin, fluticasone, levothyroxine, metoprolol succinate, telmisartan, trazodone, and zolpidem.  Pharmacotherapy (Medications Ordered): No orders of the defined types were placed in this encounter.  Orders:  Orders Placed This Encounter  Procedures   GENICULAR NERVE BLOCK    Indication(s):  Sub-acute knee pain    Standing Status:   Future    Standing Expiration Date:   09/20/2021    Scheduling Instructions:     Side: Left-sided     Sedation: No Sedation.     Timeframe: As soon as schedule allows    Order Specific Question:   Where will this procedure be performed?    Answer:   [facility]   DG Knee Complete 4 Views Left    Standing Status:   Future    Standing Expiration Date:   06/20/2022    Order Specific Question:   Reason for Exam (  SYMPTOM  OR DIAGNOSIS REQUIRED)    Answer:   Chronic left knee pain    Order Specific Question:   Preferred imaging location?    Answer:   [facility]    Follow-up plan:   Return for Procedure (no sedation): (L) Genicular NB #1 (no steroid).     Interventional treatment options: Planned, scheduled, and/or pending:   Diagnostic left genicular nerve block #1    Under consideration:   Diagnostic right T9-10 thoracic ESI #1  Diagnostic bilateral T9, T10, T11, and T12 MMB thoracic facet RFA (DIFICULT CANDIDATE DUE TO HARDWARE)    Therapeutic/palliative (PRN):   Diagnostic bilateral T9, T10, T11, and T12 MMB thoracic  facet block #3  Diagnostic/Therapeutic right trapezoid bursa injection #2      Recent Visits No visits were found meeting these conditions. Showing recent visits within past 90 days and meeting all other requirements Today's Visits Date Type Provider Dept  06/20/21 Telemedicine Milinda Pointer, MD [facility]  Showing today's visits and meeting all other requirements Future Appointments No visits were found meeting these conditions. Showing future appointments within next 90 days and meeting all other requirements I discussed the assessment and treatment plan with the patient. The patient was provided an opportunity to ask questions and all were answered. The patient agreed with the plan and demonstrated an understanding of the instructions.  Patient advised to call back or seek an in-person evaluation if the symptoms or condition worsens.  Duration of encounter: 40 minutes.  Note by: Gaspar Cola, MD Date: 06/20/2021; Time: 8:12 PM

## 2021-06-25 ENCOUNTER — Ambulatory Visit
Admission: RE | Admit: 2021-06-25 | Discharge: 2021-06-25 | Disposition: A | Discharge status: Home / Self Care | Payer: Medicare Other | Source: Ambulatory Visit | Attending: Pain Medicine | Admitting: Pain Medicine

## 2021-06-25 ENCOUNTER — Ambulatory Visit
Admission: RE | Admit: 2021-06-25 | Discharge: 2021-06-25 | Disposition: A | Discharge status: Home / Self Care | Payer: Medicare Other | Attending: Pain Medicine | Admitting: Pain Medicine

## 2021-06-25 DIAGNOSIS — G8929 Other chronic pain: Secondary | ICD-10-CM | POA: Insufficient documentation

## 2021-06-25 DIAGNOSIS — M7122 Synovial cyst of popliteal space [Baker], left knee: Secondary | ICD-10-CM

## 2021-06-25 DIAGNOSIS — M1712 Unilateral primary osteoarthritis, left knee: Secondary | ICD-10-CM

## 2021-06-25 DIAGNOSIS — M25562 Pain in left knee: Secondary | ICD-10-CM | POA: Diagnosis not present

## 2021-06-25 IMAGING — CR DG KNEE COMPLETE 4+V*L*
1 series · 4 of 4 positions shown · non-contrast
Comparison: None.

CLINICAL DATA: Chronic left knee pain. Osteoarthritis of left knee;
Was told she needs surgery but pt doesn't want it so they are moving
forward with nerve blocks in pain clinic

EXAM:
LEFT KNEE - COMPLETE 4+ VIEW

[Series 1: t knee ap left · 0.14mm/px · 4 of 4 slices shown]
[im 1/4]
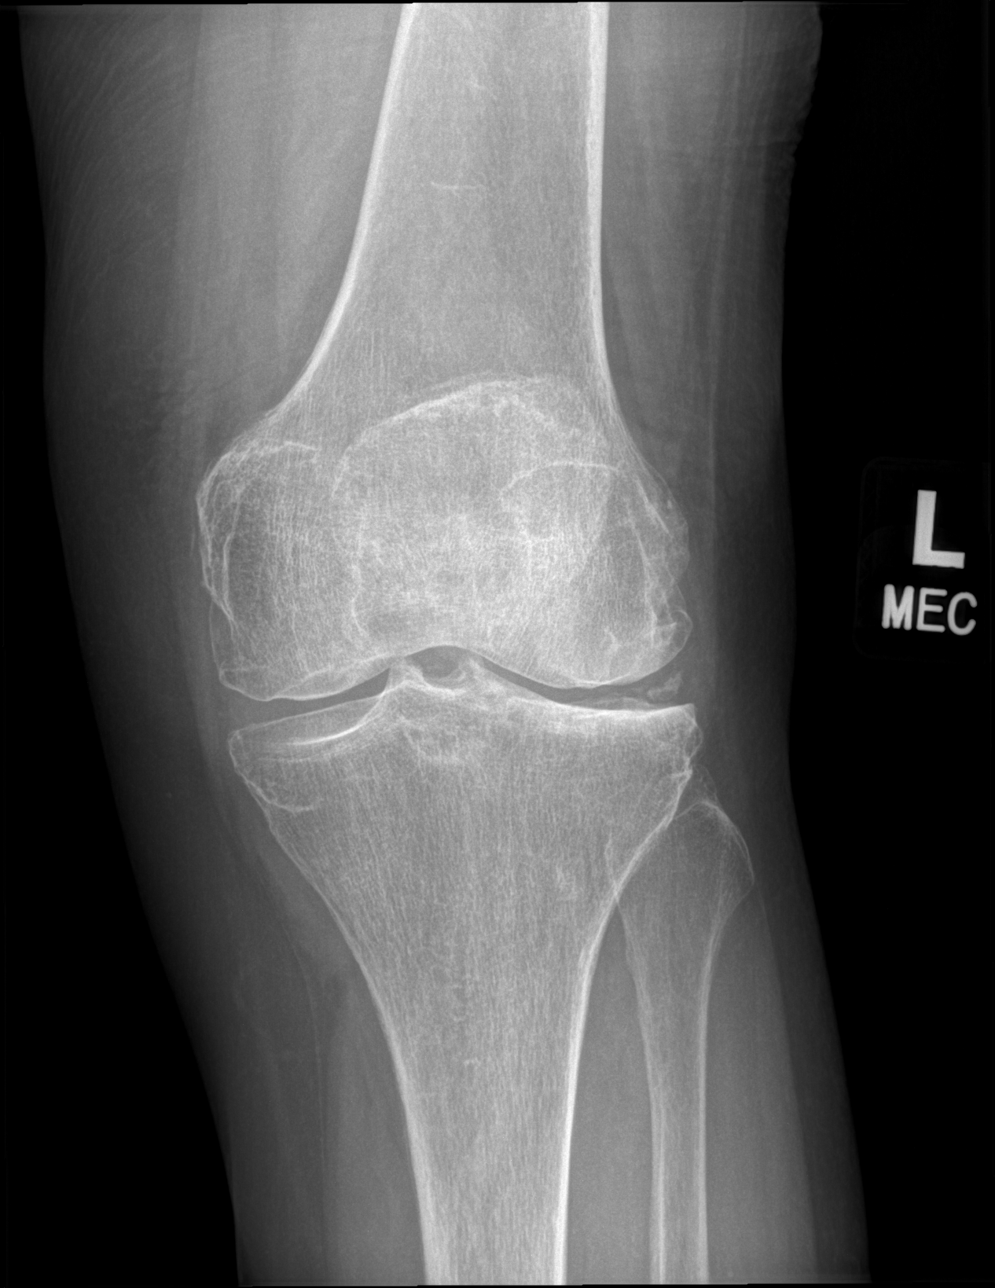
[im 2/4]
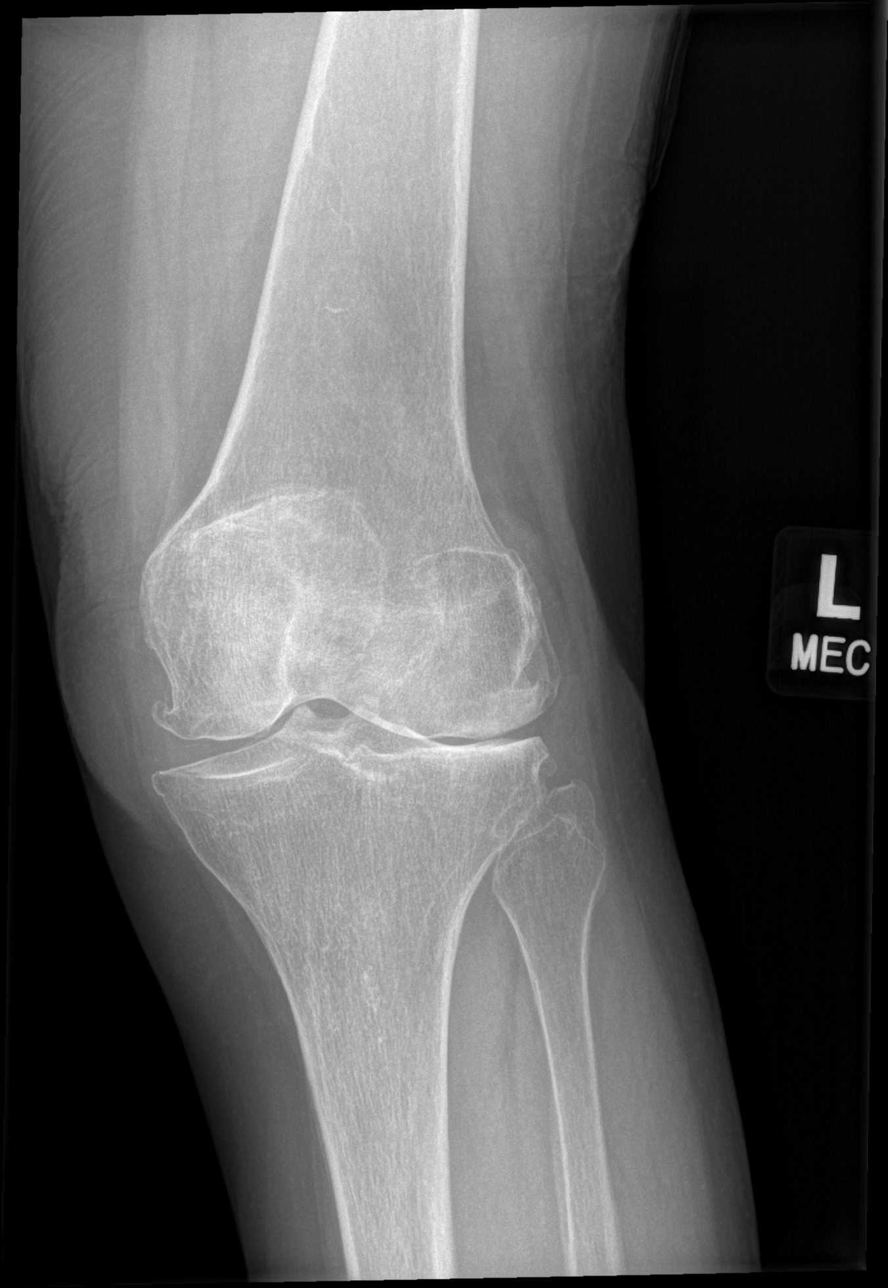
[im 3/4]
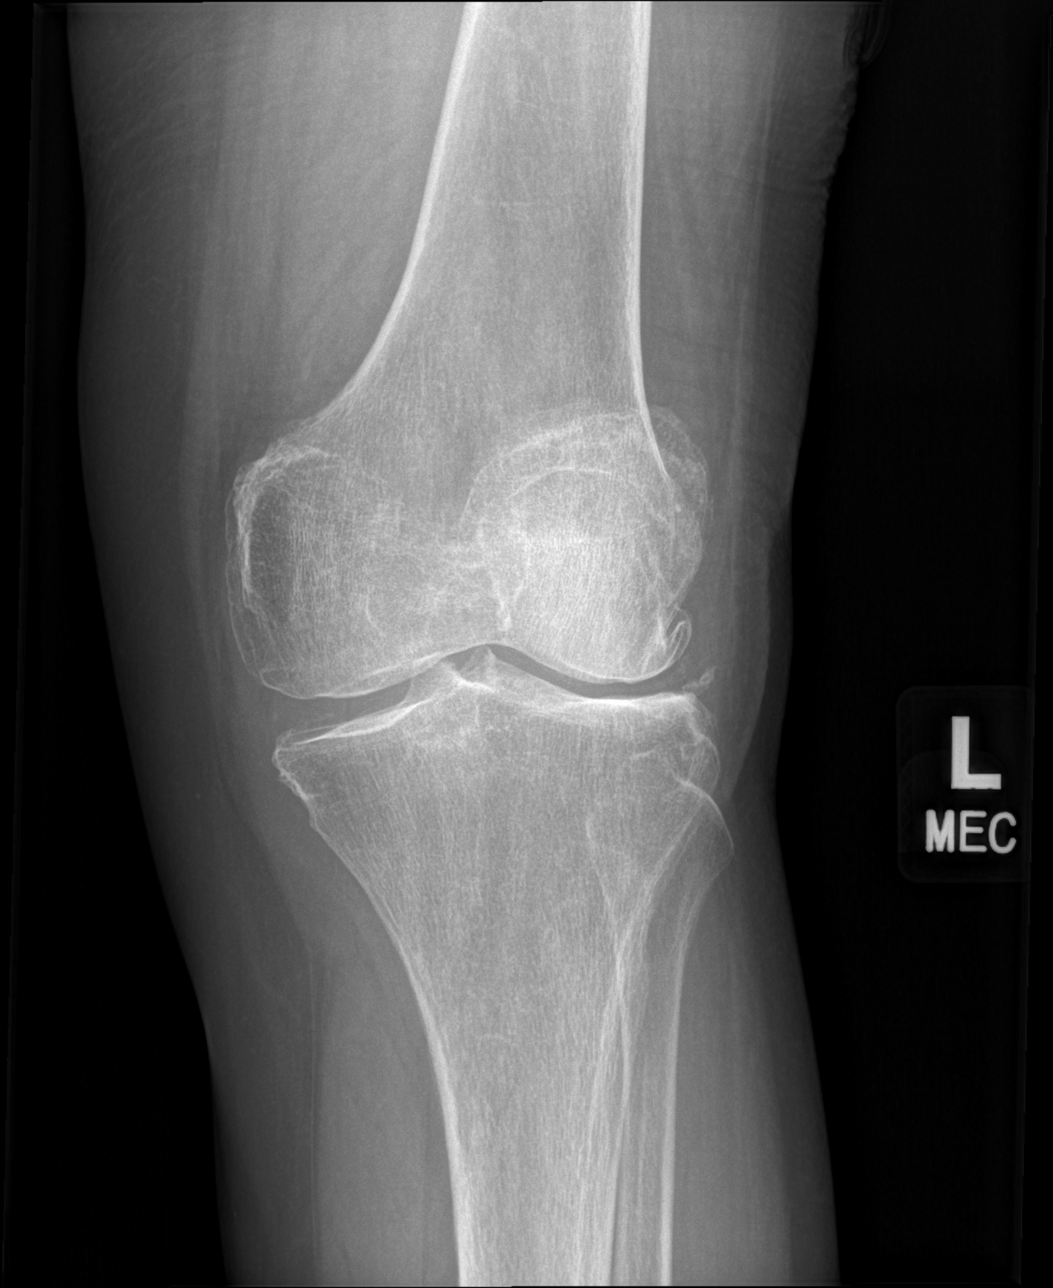
[im 4/4]
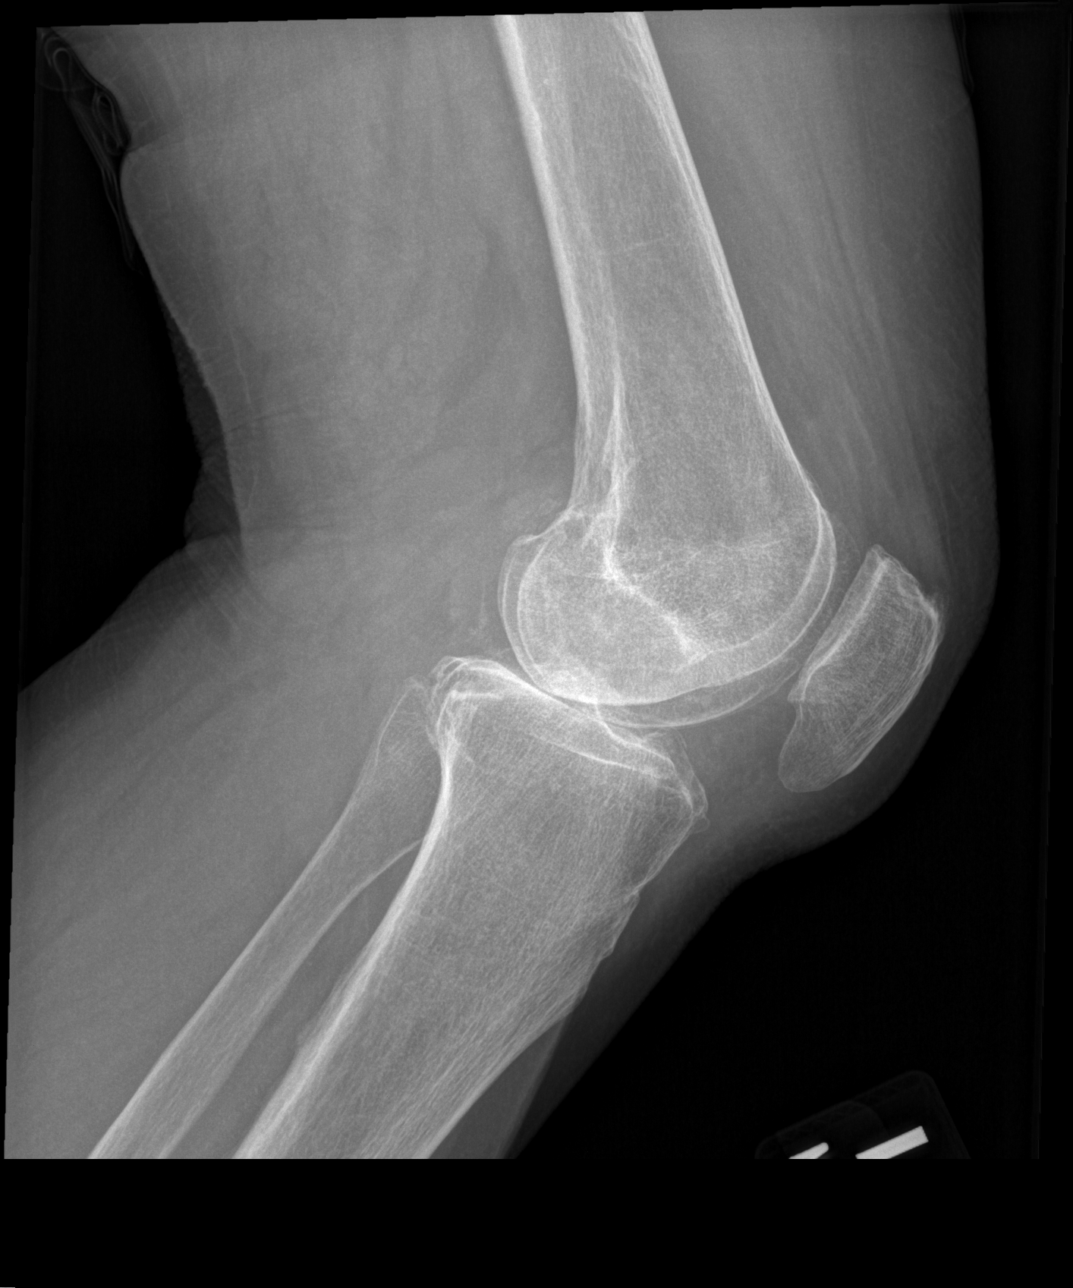

[4 of 4 positions shown; findings below may reference images not displayed]

FINDINGS: No evidence of fracture, dislocation, or joint effusion. Moderate to
severe tricompartmental degenerative changes of the knee. Soft
tissues are unremarkable.
IMPRESSION: 1. No acute displaced fracture or dislocation.
2. Moderate to severe tricompartmental degenerative changes of the
knee.

## 2021-06-26 DIAGNOSIS — F4321 Adjustment disorder with depressed mood: Secondary | ICD-10-CM | POA: Diagnosis not present

## 2021-07-02 NOTE — Progress Notes (Signed)
PROVIDER NOTE: Information contained herein reflects review and annotations entered in association with encounter. Interpretation of such information and data should be left to medically-trained personnel. Information provided to patient can be located elsewhere in the medical record under "Patient Instructions". Document created using STT-dictation technology, any transcriptional errors that may result from process are unintentional.    Patient: Maria Spencer  Service Category: Procedure  Provider: Gaspar Cola, MD  DOB: 10/28/1946  DOS: 07/03/2021  Location: [facility]  MRN: [id number]  Setting: Ambulatory - outpatient  Referring Provider: Milinda Pointer, MD  Type: Established Patient  Specialty: Interventional Pain Management  PCP: McLean-Scocuzza, Nino Glow, MD   Primary Reason for Visit: Interventional Pain Management Treatment. CC: Knee Pain (Left )  Procedure:          Anesthesia, Analgesia, Anxiolysis:  Type: Genicular Nerves Block (Superolateral, Superomedial, and Inferomedial Genicular Nerves)          CPT: [id number]      Primary Purpose: Diagnostic Region: Lateral, Anterior, and Medial aspects of the knee joint, above and below the knee joint proper. Level: Superior and inferior to the knee joint. Target Area: For Genicular Nerve block(s), the targets are: the superolateral genicular nerve, located in the lateral distal portion of the femoral shaft as it curves to form the lateral epicondyle, in the region of the distal femoral metaphysis and the inferolateral genicular nerve, located in the lateral, proximal portion of the tibial shaft, as it curves to form the lateral epicondyle, in the region of the proximal tibial metaphysis. Approach: Anterior, percutaneous, ipsilateral approach. Laterality: Left knee  Type: Local Anesthesia Indication(s): Analgesia         Route: Infiltration ([place]/IM) IV Access: Declined Sedation: Declined  Local Anesthetic: Lidocaine  1-2%  Position: Modified Fowler's position with pillows under the targeted knee(s).   Indications: 1. Chronic knee pain (4th area of Pain) (Posterior) (Left)   2. Osteoarthritis of knee (Left)   3. Baker's cyst of knee (Left)    Pain Score: Pre-procedure: 5 /10 Post-procedure: 0-No pain/10   Pre-op H&P Assessment:  Maria Spencer is a 75 y.o. (year old), female patient, seen today for interventional treatment. She  has a past surgical history that includes Total shoulder replacement (Right, 2012); Shoulder surgery (Left, 2008, 2009); Tonsillectomy; Bone graft hip iliac crest; Spine surgery (10/23/2017); Ventral hernia repair (N/A, 05/29/2018); Insertion of mesh (N/A, 05/29/2018); Eye surgery; Cardiac catheterization; Breast lumpectomy (Right, 2009); Breast biopsy (Left, 2009); Breast biopsy (Right, 2009); Colonoscopy with propofol (N/A, 05/15/2020); Spinal fusion (07/24/2020); Joint replacement; Hernia repair; Cataract extraction, bilateral (Bilateral, 10/2019); Tubal ligation; Quadriceps tendon repair (Right, 12/05/2020); and right gluteus medius/minimus repair Dr. Roland Rack 11/2020 . Maria Spencer has a current medication list which includes the following prescription(s): atorvastatin, calcium carb-cholecalciferol, fluticasone, levothyroxine, metoprolol succinate, multi-vitamins, pregabalin, telmisartan, vitamin b-12, zolpidem, cephalexin, and trazodone. Her primarily concern today is the Knee Pain (Left )  Initial Vital Signs:  Pulse/HCG Rate: 73ECG Heart Rate: 67 Temp: (!) 96.9 F (36.1 C) Resp: 16 BP: (!) 143/65 SpO2: 99 %  BMI: Estimated body mass index is 25.45 kg/m as calculated from the following:   Height as of this encounter: '4\' 11"'$  (1.499 m).   Weight as of this encounter: 126 lb (57.2 kg).  Risk Assessment: Allergies: Reviewed. She is allergic to sulfa antibiotics, nitrofuran derivatives, amlodipine besylate, and nitrofurantoin.  Allergy Precautions: None required Coagulopathies: Reviewed.  None identified.  Blood-thinner therapy: None at this time Active Infection(s): Reviewed. None identified. Ms.  Spencer is afebrile  Site Confirmation: Maria Spencer was asked to confirm the procedure and laterality before marking the site Procedure checklist: Completed Consent: Before the procedure and under the influence of no sedative(s), amnesic(s), or anxiolytics, the patient was informed of the treatment options, risks and possible complications. To fulfill our ethical and legal obligations, as recommended by the American Medical Association's Code of Ethics, I have informed the patient of my clinical impression; the nature and purpose of the treatment or procedure; the risks, benefits, and possible complications of the intervention; the alternatives, including doing nothing; the risk(s) and benefit(s) of the alternative treatment(s) or procedure(s); and the risk(s) and benefit(s) of doing nothing. The patient was provided information about the general risks and possible complications associated with the procedure. These may include, but are not limited to: failure to achieve desired goals, infection, bleeding, organ or nerve damage, allergic reactions, paralysis, and death. In addition, the patient was informed of those risks and complications associated to the procedure, such as failure to decrease pain; infection; bleeding; organ or nerve damage with subsequent damage to sensory, motor, and/or autonomic systems, resulting in permanent pain, numbness, and/or weakness of one or several areas of the body; allergic reactions; (i.e.: anaphylactic reaction); and/or death. Furthermore, the patient was informed of those risks and complications associated with the medications. These include, but are not limited to: allergic reactions (i.e.: anaphylactic or anaphylactoid reaction(s)); adrenal axis suppression; blood sugar elevation that in diabetics may result in ketoacidosis or comma; water retention that in  patients with history of congestive heart failure may result in shortness of breath, pulmonary edema, and decompensation with resultant heart failure; weight gain; swelling or edema; medication-induced neural toxicity; particulate matter embolism and blood vessel occlusion with resultant organ, and/or nervous system infarction; and/or aseptic necrosis of one or more joints. Finally, the patient was informed that Medicine is not an exact science; therefore, there is also the possibility of unforeseen or unpredictable risks and/or possible complications that may result in a catastrophic outcome. The patient indicated having understood very clearly. We have given the patient no guarantees and we have made no promises. Enough time was given to the patient to ask questions, all of which were answered to the patient's satisfaction. Ms. Appiah has indicated that she wanted to continue with the procedure. Attestation: I, the ordering provider, attest that I have discussed with the patient the benefits, risks, side-effects, alternatives, likelihood of achieving goals, and potential problems during recovery for the procedure that I have provided informed consent. Date  Time: 07/03/2021 10:39 AM  Pre-Procedure Preparation:  Monitoring: As per clinic protocol. Respiration, ETCO2, SpO2, BP, heart rate and rhythm monitor placed and checked for adequate function Safety Precautions: Patient was assessed for positional comfort and pressure points before starting the procedure. Time-out: I initiated and conducted the "Time-out" before starting the procedure, as per protocol. The patient was asked to participate by confirming the accuracy of the "Time Out" information. Verification of the correct person, site, and procedure were performed and confirmed by me, the nursing staff, and the patient. "Time-out" conducted as per Joint Commission's Universal Protocol (UP.01.01.01). Time: 1115  Description of Procedure:          Area  Prepped: Entire knee area, from mid-thigh to mid-shin, lateral, anterior, and medial aspects. DuraPrep (Iodine Povacrylex [0.7% available iodine] and Isopropyl Alcohol, 74% w/w) Safety Precautions: Aspiration looking for blood return was conducted prior to all injections. At no point did we inject any substances, as a  needle was being advanced. No attempts were made at seeking any paresthesias. Safe injection practices and needle disposal techniques used. Medications properly checked for expiration dates. SDV (single dose vial) medications used. Description of the Procedure: Protocol guidelines were followed. The patient was placed in position over the procedure table. The target area was identified and the area prepped in the usual manner. Skin & deeper tissues infiltrated with local anesthetic. Appropriate amount of time allowed to pass for local anesthetics to take effect. The procedure needles were then advanced to the target area. Proper needle placement secured. Negative aspiration confirmed. Solution injected in intermittent fashion, asking for systemic symptoms every 0.5cc of injectate. The needles were then removed and the area cleansed, making sure to leave some of the prepping solution back to take advantage of its long term bactericidal properties.         Vitals:   07/03/21 1039 07/03/21 1105 07/03/21 1115 07/03/21 1124  BP: (!) 143/65 (!) 156/69 (!) 147/67 (!) 150/78  Pulse: 73     Resp: '16 18 18 18  '$ Temp: (!) 96.9 F (36.1 C)     TempSrc: Temporal     SpO2: 99% 99% 100% 100%  Weight: 126 lb (57.2 kg)     Height: '4\' 11"'$  (1.499 m)       Start Time: 1115 hrs. End Time: 1122 hrs. Materials:  Needle(s) Type: Spinal Needle Gauge: 22G Length: 3.5-in Medication(s): Please see orders for medications and dosing details.  Imaging Guidance (Non-Spinal):          Type of Imaging Technique: Fluoroscopy Guidance (Non-Spinal) Indication(s): Assistance in needle guidance and placement for  procedures requiring needle placement in or near specific anatomical locations not easily accessible without such assistance. Exposure Time: Please see nurses notes. Contrast: None used. Fluoroscopic Guidance: I was personally present during the use of fluoroscopy. "Tunnel Vision Technique" used to obtain the best possible view of the target area. Parallax error corrected before commencing the procedure. "Direction-depth-direction" technique used to introduce the needle under continuous pulsed fluoroscopy. Once target was reached, antero-posterior, oblique, and lateral fluoroscopic projection used confirm needle placement in all planes. Images permanently stored in EMR. Interpretation: No contrast injected. I personally interpreted the imaging intraoperatively. Adequate needle placement confirmed in multiple planes. Permanent images saved into the patient's record.  Antibiotic Prophylaxis:   Anti-infectives (From admission, onward)    None      Indication(s): None identified  Post-operative Assessment:  Post-procedure Vital Signs:  Pulse/HCG Rate: 7363 Temp: (!) 96.9 F (36.1 C) Resp: 18 BP: (!) 150/78 SpO2: 100 %  EBL: None  Complications: No immediate post-treatment complications observed by team, or reported by patient.  Note: The patient tolerated the entire procedure well. A repeat set of vitals were taken after the procedure and the patient was kept under observation following institutional policy, for this type of procedure. Post-procedural neurological assessment was performed, showing return to baseline, prior to discharge. The patient was provided with post-procedure discharge instructions, including a section on how to identify potential problems. Should any problems arise concerning this procedure, the patient was given instructions to immediately contact us, at any time, without hesitation. In any case, we plan to contact the patient by telephone for a follow-up status  report regarding this interventional procedure.  Comments:  No additional relevant information.  Plan of Care  Orders:  Orders Placed This Encounter  Procedures   GENICULAR NERVE BLOCK    Scheduling Instructions:     Side(s): Left Knee  Level(s): Superior-Lateral, Superior-Medial, and Inferior-Medial Genicular Nerves     Sedation: No Sedation.     Timeframe: Today    Order Specific Question:   Where will this procedure be performed?    Answer:   [facility]   DG PA[facility] C-ARM 1-60 MIN NO REPORT    Intraoperative interpretation by procedural physician at [place].    Standing Status:   Standing    Number of Occurrences:   1    Order Specific Question:   Reason for exam:    Answer:   Assistance in needle guidance and placement for procedures requiring needle placement in or near specific anatomical locations not easily accessible without such assistance.   Informed Consent Details: Physician/Practitioner Attestation; Transcribe to consent form and obtain patient signature    Note: Always confirm laterality of pain with Ms. Ennen, before procedure. Transcribe to consent form and obtain patient signature.    Order Specific Question:   Physician/Practitioner attestation of informed consent for procedure/surgical case    Answer:   I, the physician/practitioner, attest that I have discussed with the patient the benefits, risks, side effects, alternatives, likelihood of achieving goals and potential problems during recovery for the procedure that I have provided informed consent.    Order Specific Question:   Procedure    Answer:   Block of the genicular nerves of the knee (Genicular Nerves Block)    Order Specific Question:   Physician/Practitioner performing the procedure    Answer:   Alyssah Algeo A. Dossie Arbour, MD    Order Specific Question:   Indication/Reason    Answer:   Chronic knee pain   Provide equipment / supplies at bedside    "Block Tray" (Disposable   single use) Needle type: SpinalSpinal Amount/quantity: 3 Size: Regular (3.5-inch) Gauge: 22G    Standing Status:   Standing    Number of Occurrences:   1    Order Specific Question:   Specify    Answer:   Block Tray    Chronic Opioid Analgesic:  No opioid analgesics prescribed by our practice. Highest recorded MME/day: 75 mg/day MME/day: 0 mg/day   Medications ordered for procedure: Meds ordered this encounter  Medications   lidocaine (XYLOCAINE) 2 % (with pres) injection 400 mg   ropivacaine (PF) 2 mg/mL (0.2%) (NAROPIN) injection 9 mL   methylPREDNISolone acetate (DEPO-MEDROL) injection 40 mg    Medications administered: We administered lidocaine, ropivacaine (PF) 2 mg/mL (0.2%), and methylPREDNISolone acetate.  See the medical record for exact dosing, route, and time of administration.  Follow-up plan:   Return in about 2 weeks (around 07/17/2021) for (F2F-PPE) PM Proc-day (T,Th).      Interventional treatment options: Planned, scheduled, and/or pending:   Diagnostic left genicular nerve block #1    Under consideration:   Diagnostic right T9-10 thoracic ESI #1  Diagnostic bilateral T9, T10, T11, and T12 MMB thoracic facet RFA (DIFICULT CANDIDATE DUE TO HARDWARE)    Therapeutic/palliative (PRN):   Diagnostic bilateral T9, T10, T11, and T12 MMB thoracic facet block #3  Diagnostic/Therapeutic right trapezoid bursa injection #2       Recent Visits Date Type Provider Dept  06/20/21 Telemedicine Milinda Pointer, MD [facility]  Showing recent visits within past 90 days and meeting all other requirements Today's Visits Date Type Provider Dept  07/03/21 Procedure visit Milinda Pointer, MD [facility]  Showing today's visits and meeting all other requirements Future Appointments Date Type Provider Dept  07/17/21 Appointment Milinda Pointer,  MD [facility]  Showing future appointments within next 90 days and meeting all other  requirements Disposition: Discharge home  Discharge (Date  Time): 07/03/2021; 1135 hrs.   Primary Care Physician: McLean-Scocuzza, Nino Glow, MD Location: [facility] Note by: Gaspar Cola, MD Date: 07/03/2021; Time: 1:35 PM  Disclaimer:  Medicine is not an Chief Strategy Officer. The only guarantee in medicine is that nothing is guaranteed. It is important to note that the decision to proceed with this intervention was based on the information collected from the patient. The Data and conclusions were drawn from the patient's questionnaire, the interview, and the physical examination. Because the information was provided in large part by the patient, it cannot be guaranteed that it has not been purposely or unconsciously manipulated. Every effort has been made to obtain as much relevant data as possible for this evaluation. It is important to note that the conclusions that lead to this procedure are derived in large part from the available data. Always take into account that the treatment will also be dependent on availability of resources and existing treatment guidelines, considered by other Pain Management Practitioners as being common knowledge and practice, at the time of the intervention. For Medico-Legal purposes, it is also important to point out that variation in procedural techniques and pharmacological choices are the acceptable norm. The indications, contraindications, technique, and results of the above procedure should only be interpreted and judged by a Board-Certified Interventional Pain Specialist with extensive familiarity and expertise in the same exact procedure and technique.

## 2021-07-03 ENCOUNTER — Ambulatory Visit (HOSPITAL_BASED_OUTPATIENT_CLINIC_OR_DEPARTMENT_OTHER): Payer: Medicare Other | Admitting: Pain Medicine

## 2021-07-03 ENCOUNTER — Encounter: Payer: Self-pay | Admitting: Pain Medicine

## 2021-07-03 ENCOUNTER — Ambulatory Visit
Admission: RE | Admit: 2021-07-03 | Discharge: 2021-07-03 | Disposition: A | Discharge status: Home / Self Care | Payer: Medicare Other | Source: Ambulatory Visit | Attending: Pain Medicine | Admitting: Pain Medicine

## 2021-07-03 ENCOUNTER — Other Ambulatory Visit: Payer: Self-pay

## 2021-07-03 VITALS — BP 150/78 | HR 73 | Temp 96.9°F | Resp 18 | Ht 59.0 in | Wt 126.0 lb

## 2021-07-03 DIAGNOSIS — M25562 Pain in left knee: Secondary | ICD-10-CM

## 2021-07-03 DIAGNOSIS — G8929 Other chronic pain: Secondary | ICD-10-CM | POA: Diagnosis not present

## 2021-07-03 DIAGNOSIS — M7122 Synovial cyst of popliteal space [Baker], left knee: Secondary | ICD-10-CM

## 2021-07-03 DIAGNOSIS — M1712 Unilateral primary osteoarthritis, left knee: Secondary | ICD-10-CM

## 2021-07-03 MED ORDER — METHYLPREDNISOLONE ACETATE 40 MG/ML IJ SUSP
INTRAMUSCULAR | Status: AC
  Filled 2021-07-03: qty 1

## 2021-07-03 MED ORDER — METHYLPREDNISOLONE ACETATE 40 MG/ML IJ SUSP
40.0000 mg | Freq: Once | INTRAMUSCULAR | Status: AC
  Administered 2021-07-03: 40 mg

## 2021-07-03 MED ORDER — ROPIVACAINE HCL 2 MG/ML IJ SOLN
INTRAMUSCULAR | Status: AC
  Filled 2021-07-03: qty 20

## 2021-07-03 MED ORDER — LIDOCAINE HCL 2 % IJ SOLN
20.0000 mL | Freq: Once | INTRAMUSCULAR | Status: AC
  Administered 2021-07-03: 200 mg

## 2021-07-03 MED ORDER — ROPIVACAINE HCL 2 MG/ML IJ SOLN
9.0000 mL | Freq: Once | INTRAMUSCULAR | Status: AC
  Administered 2021-07-03: 9 mL

## 2021-07-03 NOTE — Progress Notes (Signed)
Safety precautions to be maintained throughout the outpatient stay will include: orient to surroundings, keep bed in low position, maintain call bell within reach at all times, provide assistance with transfer out of bed and ambulation.  

## 2021-07-03 NOTE — Patient Instructions (Signed)
____________________________________________________________________________________________  Genicular Nerve Block  What is a genicular nerve block? A genicular nerve block is the injection of a local anesthetic to block the nerves that transmits pain from the knee.  What is the purpose of a facet nerve block? A genicular nerve block is a diagnostic procedure to determine if the pathologic changes (i.e. arthritis, meniscal tears, etc) and inflammation within the knee joint is the source of your knee pain. It also confirms that the knee pain will respond well to the actual treatment procedure. If a genicular nerve block works, it will give you relief for several hours. After that, the pain is expected to return to normal. This test is always performed twice (usually a week or two apart) because two successful tests are required to move onto treatment. If both diagnostic tests are positive, then we schedule a treatment called radiofrequency (RF) ablation. In this procedure, the same nerves are cauterized, which typically leads to pain relief for 4 -18 months. If this process works well for one knee, it can be performed on the other knee if needed.  How is the procedure performed? You will be placed on the procedure table. The injection site is sterilized with either iodine or chlorhexadine. The site to be injected is numbed with a local anesthetic, and a needle is directed to the target area. X-ray guidance is used to ensure proper placement and positioning of the needle. When the needle is properly positioned near the genicular nerve, local anesthetic is injected to numb that nerve. This will be repeated at multiple sites around the knee to block all genicular nerves.  Will the procedure be painful? The injection can be painful and we therefore provide the option of receiving IV sedation. IV sedation, combined with local anesthetic, can make the injection nearly pain free. It allows you to remain very  still during the procedure, which can also make the injection easier, faster, and more successful. If you decide to have IV sedation, you must have a driver to get you home safely afterwards. In addition, you cannot have anything to eat or drink within 8 hours of your appointment (clear liquids are allowed until 3 hours before the procedure). If you take medications for diabetes, these medications may need to be adjusted the morning of the procedure. Your primary care physician can help you with this adjustment.  What are the discharge instructions? If you received IV sedation do not drive or operate machinery for at least 24 hours after the procedure. You may return to work the next day following your procedure. You may resume your normal diet immediately. Do not engage in any strenuous activity for 24 hours. You should, however, engage in moderate activity that typically causes your ususal pain. If the block works, those activities should not be painful for several hours after the injection. Do not take a bath, swim, or use a hot tub for 24 hours (you may take a shower). Call the office if you have any of the following: severe pain afterwards (different than your usual symptoms), redness/swelling/discharge at the injection site(s), fevers/chills, difficulty with bowel or bladder functions.  What are the risks and side effects? The complication rate for this procedure is very low. Whenever a needle enters the skin, bleeding or infection can occur. Some other serious but extremely rare risks include paralysis and death. You may have an allergic reaction to any of the medications used. If you have a known allergy to any medications, especially local anesthetics, notify  our staff before the procedure takes place. You may experience any of the following side effects up to 4 - 6 hours after the procedure: Leg muscle weakness or numbness may occur due to the local anesthetic affecting the nerves that control  your legs (this is a temporary affect and it is not paralysis). If you have any leg weakness or numbness, walk only with assistance in order to prevent falls and injury. Your leg strength will return slowly and completely. Dizziness may occur due to a decrease in your blood pressure. If this occurs, remain in a seated or lying position. Gradually sit up, and then stand after at least 10 minutes of sitting. Mild headaches may occur. Drink fluids and take pain medications if needed. If the headaches persist or become severe, call the office. Mild discomfort at the injection site can occur. This typically lasts for a few hours but can persist for a couple days. If this occurs, take anti-inflammatories or pain medications, apply ice to the area the day of the procedure. If it persists, apply moist heat in the day(s) following.  The side effects listed above can be normal. They are not dangerous and will resolve on their own. If, however, you experience any of the following, a complication may have occurred and you should either contact your doctor. If he is not readily available, then you should proceed to the closest urgent care center for evaluation: Severe or progressive pain at the injection site(s) Arm or leg weakness that progressively worsens or persists for longer than 8 hours Severe or progressive redness, swelling, or discharge from the injections site(s) Fevers, chills, nausea, or vomiting Bowel or bladder dysfunction (i.e. inability to urinate or pass stool or difficulty controlling either)  How long does it take for the procedure to work? You should feel relief from your usual pain within the first hour. Again, this is only expected to last for several hours, at the most. Remember, you may be sore in the middle part of your back from the needles, and you must distinguish this from your usual pain. ____________________________________________________________________________________________   Pain Management Discharge Instructions  General Discharge Instructions :  If you need to reach your doctor call: Monday-Friday 8:00 am - 4:00 pm at [phone] or toll free [phone].  After clinic hours [phone] to have operator reach doctor.  Bring all of your medication bottles to all your appointments in the pain clinic.  To cancel or reschedule your appointment with Pain Management please remember to call 24 hours in advance to avoid a fee.  Refer to the educational materials which you have been given on: General Risks, I had my Procedure. Discharge Instructions, Post Sedation.  Post Procedure Instructions:  The drugs you were given will stay in your system until tomorrow, so for the next 24 hours you should not drive, make any legal decisions or drink any alcoholic beverages.  You may eat anything you prefer, but it is better to start with liquids then soups and crackers, and gradually work up to solid foods.  Please notify your doctor immediately if you have any unusual bleeding, trouble breathing or pain that is not related to your normal pain.  Depending on the type of procedure that was done, some parts of your body may feel week and/or numb.  This usually clears up by tonight or the next day.  Walk with the use of an assistive device or accompanied by an adult for the 24 hours.  You may use ice   on the affected area for the first 24 hours.  Put ice in a Ziploc bag and cover with a towel and place against area 15 minutes on 15 minutes off.  You may switch to heat after 24 hours. 

## 2021-07-04 ENCOUNTER — Ambulatory Visit: Payer: Medicare Other | Admitting: Pain Medicine

## 2021-07-04 ENCOUNTER — Telehealth: Payer: Self-pay

## 2021-07-04 NOTE — Telephone Encounter (Signed)
Post procedure phone call.  LM 

## 2021-07-05 ENCOUNTER — Ambulatory Visit
Admission: RE | Admit: 2021-07-05 | Discharge: 2021-07-05 | Disposition: A | Discharge status: Home / Self Care | Payer: Medicare Other | Source: Ambulatory Visit | Attending: Neurosurgery | Admitting: Neurosurgery

## 2021-07-05 ENCOUNTER — Other Ambulatory Visit: Payer: Self-pay | Admitting: Neurosurgery

## 2021-07-05 DIAGNOSIS — R2689 Other abnormalities of gait and mobility: Secondary | ICD-10-CM | POA: Diagnosis not present

## 2021-07-05 DIAGNOSIS — M4316 Spondylolisthesis, lumbar region: Secondary | ICD-10-CM | POA: Insufficient documentation

## 2021-07-05 DIAGNOSIS — M5416 Radiculopathy, lumbar region: Secondary | ICD-10-CM

## 2021-07-05 DIAGNOSIS — M4326 Fusion of spine, lumbar region: Secondary | ICD-10-CM | POA: Diagnosis not present

## 2021-07-05 DIAGNOSIS — M419 Scoliosis, unspecified: Secondary | ICD-10-CM | POA: Diagnosis not present

## 2021-07-05 DIAGNOSIS — M47819 Spondylosis without myelopathy or radiculopathy, site unspecified: Secondary | ICD-10-CM | POA: Diagnosis not present

## 2021-07-05 DIAGNOSIS — Z981 Arthrodesis status: Secondary | ICD-10-CM | POA: Diagnosis not present

## 2021-07-05 DIAGNOSIS — G629 Polyneuropathy, unspecified: Secondary | ICD-10-CM | POA: Diagnosis not present

## 2021-07-05 IMAGING — CR DG SCOLIOSIS EVAL COMPLETE SPINE 2-3V
1 series · 8 of 8 positions shown · non-contrast
Comparison: [DATE]

CLINICAL DATA: History of lumbar fusion with radiculopathy.

EXAM:
DG SCOLIOSIS EVAL COMPLETE SPINE 2-3V

[Series 1: dg scoliosis eval complete spine 2 or 3  · 0.14mm/px · 8 of 8 slices shown]
[im 1/8]
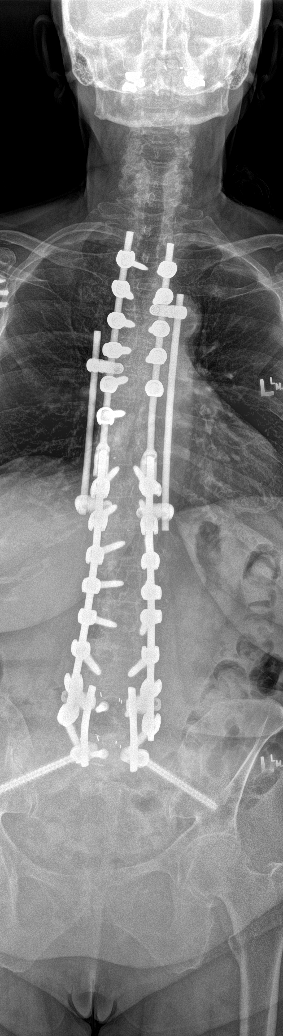
[im 2/8]
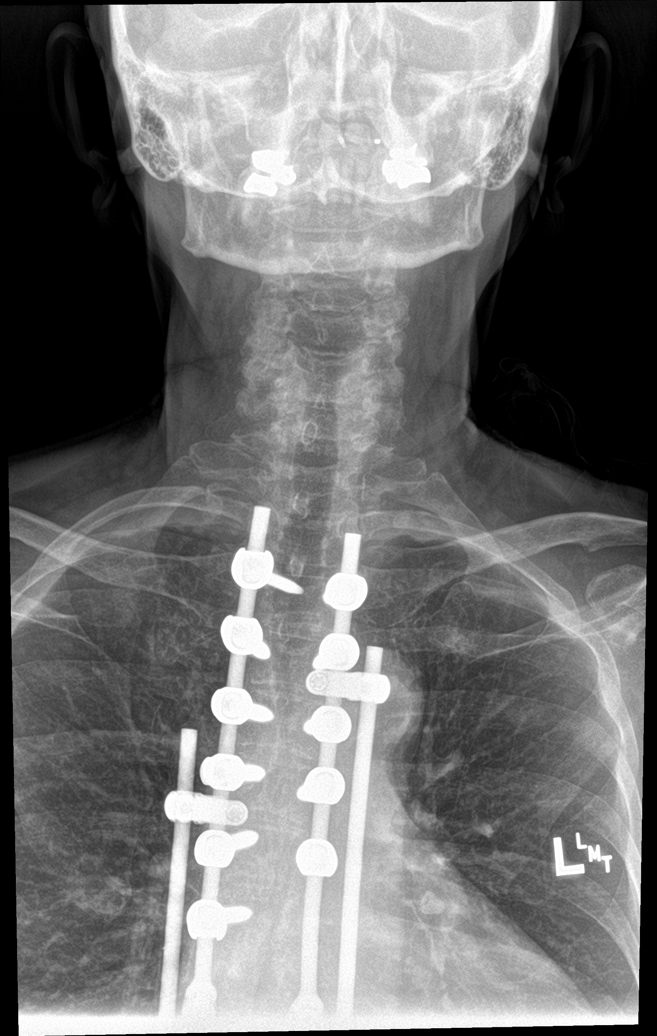
[im 3/8]
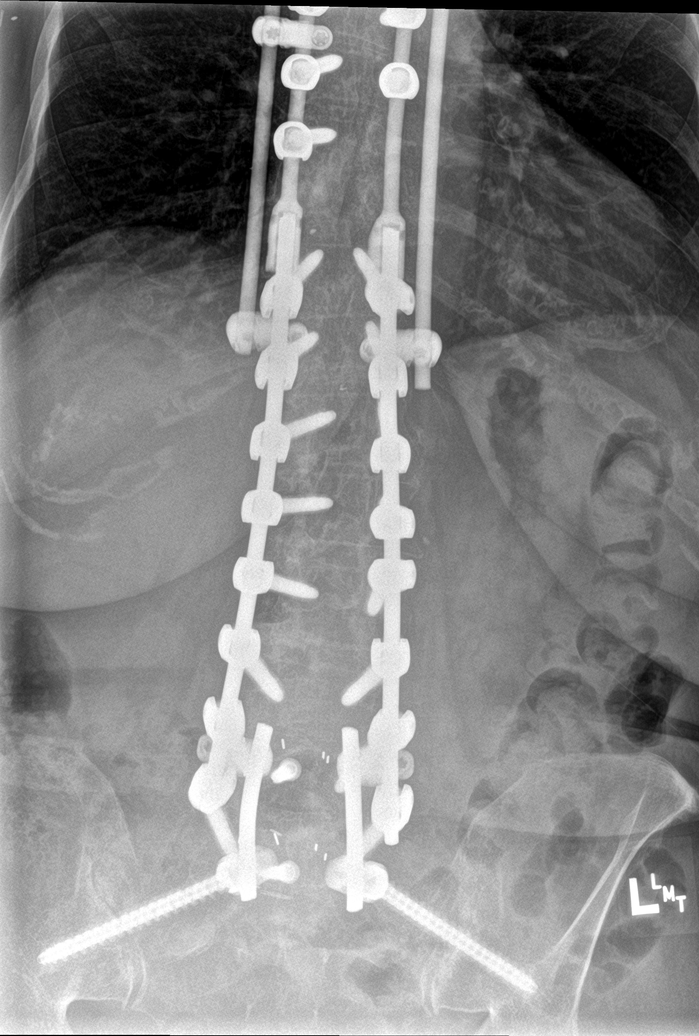
[im 4/8]
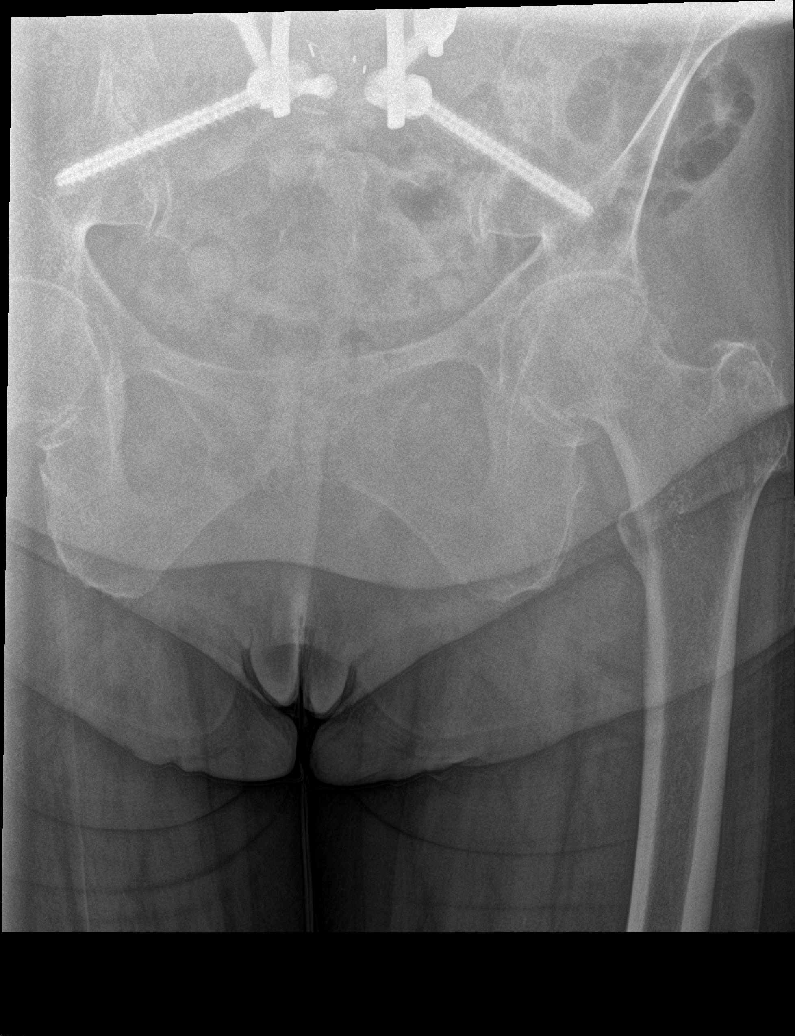
[im 5/8]
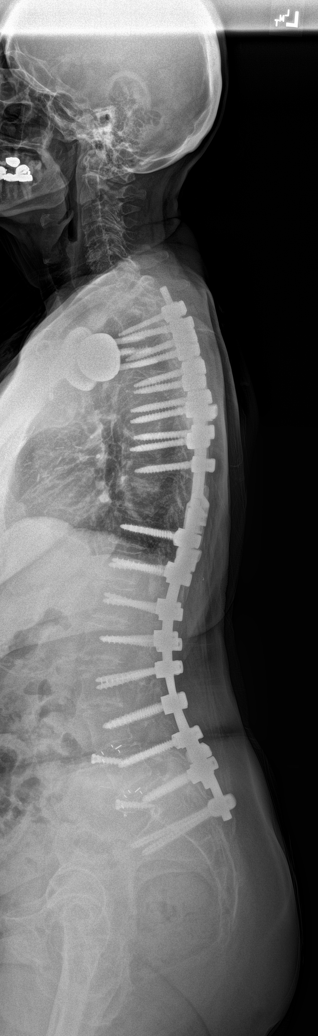
[im 6/8]
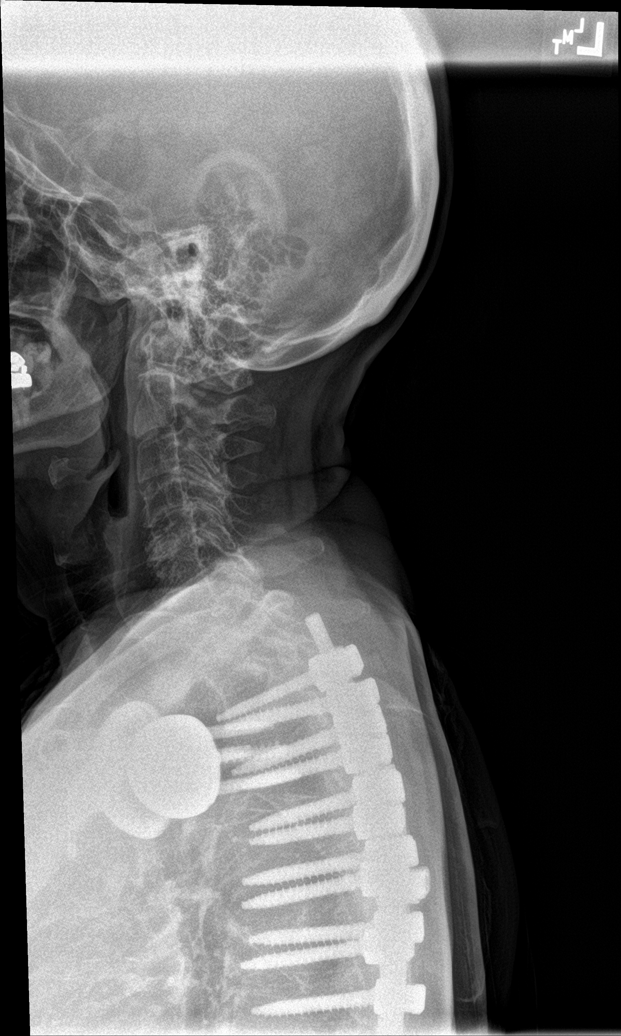
[im 7/8]
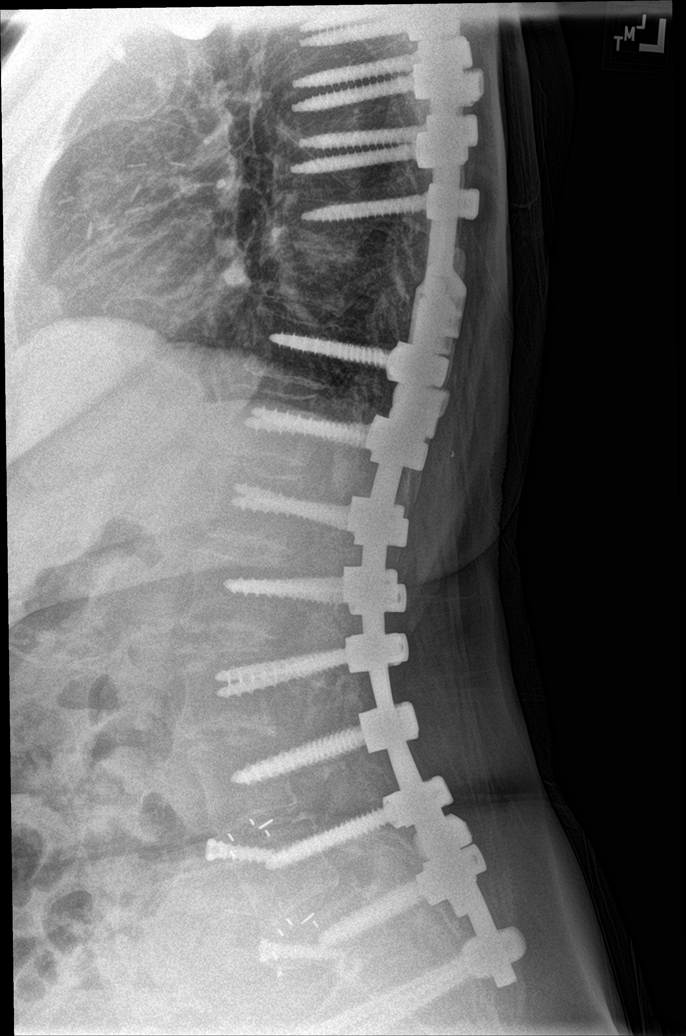
[im 8/8]
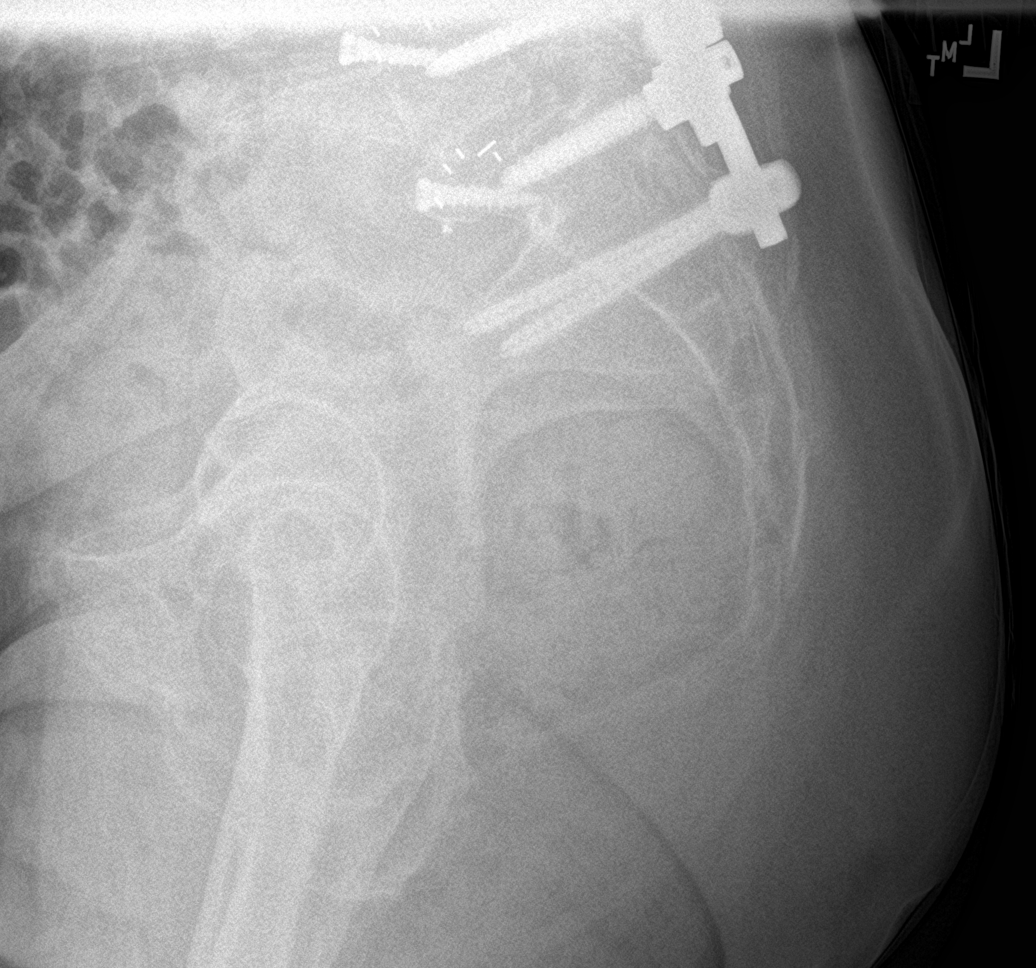

[8 of 8 positions shown; findings below may reference images not displayed]

FINDINGS: Diffuse osseous demineralization. Posterior fusion hardware present
from T10 through the sacrum intact. Interval placement of posterior
fusion hardware over the thoracic spine from T3-T8 intact. No
significant scoliosis of the thoracic spine. Mild spondylosis
throughout the spine. Stable grade 1 anterolisthesis of L4 on L5.
Mild degenerative change of the hips. Remainder of the exam is
unchanged.
IMPRESSION: 1. Interval placement of posterior fusion hardware over the thoracic
spine from T3-T8 intact. Posterior fusion hardware from T10 through
the sacrum intact and unchanged.
2. Mild spondylosis of the spine. No scoliosis. Stable grade 1
anterolisthesis of L4 on L5.

## 2021-07-16 NOTE — Progress Notes (Signed)
PROVIDER NOTE: Information contained herein reflects review and annotations entered in association with encounter. Interpretation of such information and data should be left to medically-trained personnel. Information provided to patient can be located elsewhere in the medical record under "Patient Instructions". Document created using STT-dictation technology, any transcriptional errors that may result from process are unintentional.    Patient: Maria Spencer  Service Category: E/M  Provider: Gaspar Cola, MD  DOB: 04-22-1946  DOS: 07/17/2021  Specialty: Interventional Pain Management  MRN: [id number]  Setting: Ambulatory outpatient  PCP: McLean-Scocuzza, Nino Glow, MD  Type: Established Patient    Referring Provider: Orland Mustard *  Location: Office  Delivery: Face-to-face     HPI  [name], a 75 y.o. year old female, is here today because of her Chronic pain of left knee [M25.562, G89.29]. Ms. Friel primary complain today is Knee Pain (Left/) Last encounter: [name] was on 07/03/2021. Pertinent problems: Ms. 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; Arthritis; Chronic hip pain (2ry area of Pain) (Bilateral) (R>L); Chronic low back pain with sciatica; Chronic thoracic back pain (1ry area of Pain) (Bilateral) (R>L); Fusion of spine of lumbosacral region; Lumbosacral spondylosis with radiculopathy; Chronic pain syndrome; Chronic lower extremity pain (3ry area of Pain) (Right); Chronic knee pain (4th area of Pain) (Posterior) (Left); Thoracic facet syndrome (Bilateral); Chronic sacroiliac joint pain (Bilateral); Spondylosis without myelopathy or radiculopathy, thoracic region; Closed wedge compression fracture of T8 vertebra ([place]); Osteoarthritis of hips (Bilateral); Abnormal thoracolumbar CT myelogram (01/05/2020); Breast cancer ([place]); 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); DDD (degenerative disc disease), cervical; DDD (degenerative disc disease), thoracic; DDD (degenerative disc disease), lumbar; Displacement of thoracic intervertebral disc (C6-T3, T4-5, T6-7, T8-9); Non-traumatic compression fracture of T8 thoracic vertebra, sequela; Atypical chest pain; Cervicalgia; Chronic upper extremity pain (Right); Cervical spondylosis with radiculopathy (Right); Cervical radiculopathy at C7 (Right); Chronic shoulder pain (Bilateral) (R>L); Chronic shoulder pain after shoulder replacement (Right); Osteoarthritis of AC (acromioclavicular) joints (Bilateral); Chronic Acromioclavicular (AC) joint pain (Bilateral); Osteoarthritis of glenohumeral joint (Left); Tear of right gluteus minimus tendon; Abnormal MRI, cervical spine (09/26/2020); Cervical facet syndrome (Bilateral); Cervicogenic headache (Bilateral); Occipital neuralgia (greater occipital nerve) (Bilateral); Cervico-occipital neuralgia (Bilateral); Cervical facet hypertrophy (Multilevel) (Bilateral); Spondylosis without myelopathy or radiculopathy, cervical region; Scoliosis of thoracic spine, unspecified scoliosis type; 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 Pain Assessment: Severity of Chronic pain is reported as a 2 /10. Location: Knee Left/Denies. Onset: More than a month ago. Quality: Aching, Constant. Timing: Constant. Modifying factor(s): rest and ice, procedures. Vitals:  height is 4' 11" (1.499 m) and weight is 128 lb (58.1 kg). Her temperature is 96.8 F (36 C) (abnormal). Her blood pressure is 105/95 (abnormal) and her pulse is 70. Her oxygen saturation is 97%.   Reason for encounter: post-procedure assessment.  The patient indicates having attained an ongoing 50% relief of her  left knee pain.  However, she also indicates having attained 100% relief of the lower extremity pain for the duration of the local anesthetic confirming that the lesioning of the genicular nerve may be a good alternative for long-term relief of the knee pain.  She indicates having had a prior diagnostic injection with similar results from  Dr. Sharlet Salina.  Today we will request preapproval for radiofrequency ablation hoping that the insurance company will count both of the diagnostic injections done by different physicians as sufficient to approve the radiofrequency ablation.  X-rays of the knee have confirmed the patient to have severe tricompartmental osteoarthritis of the left knee.  Post-Procedure Evaluation  Procedure (07/03/2021):  Type: Genicular Nerves Block (Superolateral, Superomedial, and Inferomedial Genicular Nerves)          CPT: [id number]      Primary Purpose: Diagnostic Region: Lateral, Anterior, and Medial aspects of the knee joint, above and below the knee joint proper. Level: Superior and inferior to the knee joint. Target Area: For Genicular Nerve block(s), the targets are: the superolateral genicular nerve, located in the lateral distal portion of the femoral shaft as it curves to form the lateral epicondyle, in the region of the distal femoral metaphysis and the inferolateral genicular nerve, located in the lateral, proximal portion of the tibial shaft, as it curves to form the lateral epicondyle, in the region of the proximal tibial metaphysis. Approach: Anterior, percutaneous, ipsilateral approach. Laterality: Left knee  Pre-procedure pain level: 5/10 Post-procedure: 0/10 (100% relief)  Anxiolysis: none.  Effectiveness during initial hour after procedure (Ultra-Short Term Relief): 100 %.  Local anesthetic used: Long-acting (4-6 hours) Effectiveness: Defined as any analgesic benefit obtained secondary to the administration of local anesthetics. This carries significant diagnostic  value as to the etiological location, or anatomical origin, of the pain. Duration of benefit is expected to coincide with the duration of the local anesthetic used.  Effectiveness during initial 4-6 hours after procedure (Short-Term Relief): 100 %.  Long-term benefit: Defined as any relief past the pharmacologic duration of the local anesthetics.  Effectiveness past the initial 6 hours after procedure (Long-Term Relief): 50 % (ongoing).  Benefits, current: Defined as benefit present at the time of this evaluation.   Analgesia:   The patient indicates currently enjoying an ongoing 50% relief of the left knee pain. Function: Ms. Lodato reports improvement in function ROM: Ms. Tison reports improvement in ROM  Pharmacotherapy Assessment  Analgesic: No opioid analgesics prescribed by our practice. Highest recorded MME/day: 75 mg/day MME/day: 0 mg/day   Monitoring: [place] PMP: PDMP reviewed during this encounter.       Pharmacotherapy: No side-effects or adverse reactions reported. Compliance: No problems identified. Effectiveness: Clinically acceptable.  Chauncey Fischer, RN  07/17/2021 10:41 AM  Sign when Signing Visit Safety precautions to be maintained throughout the outpatient stay will include: orient to surroundings, keep bed in low position, maintain call bell within reach at all times, provide assistance with transfer out of bed and ambulation.      UDS:  No results found for: SUMMARY   ROS  Constitutional: Denies any fever or chills Gastrointestinal: No reported hemesis, hematochezia, vomiting, or acute GI distress Musculoskeletal: Denies any acute onset joint swelling, redness, loss of ROM, or weakness Neurological: No reported episodes of acute onset apraxia, aphasia, dysarthria, agnosia, amnesia, paralysis, loss of coordination, or loss of consciousness  Medication Review  Calcium Carb-Cholecalciferol, Multi-Vitamins, atorvastatin, fluticasone, levothyroxine, metoprolol succinate,  pregabalin, telmisartan, vitamin B-12, and zolpidem  History Review  Allergy: Ms. Blocher is allergic to sulfa antibiotics, nitrofuran derivatives, amlodipine besylate, and nitrofurantoin. Drug: Ms. Laprise  reports no history of drug use. Alcohol:  reports that she does not currently use alcohol after a past usage of about 7.0 standard drinks per week. Tobacco:  reports that she has never smoked. She has never used  smokeless tobacco. Social: Ms. Meditz  reports that she has never smoked. She has never used smokeless tobacco. She reports that she does not currently use alcohol after a past usage of about 7.0 standard drinks per week. She reports that she does not use drugs. Medical:  has a past medical history of Arthritis, Breast cancer ([place]) (2009), Chicken pox, Chronic UTI, COVID-19, Diverticular disease, Esophagitis, History of kidney stones, Hormone disorder, Hyperlipidemia, Hypertension, Hypothyroidism, Osteoporosis, Personal history of radiation therapy (2009), and Thyroid disease. Surgical: Ms. Eakins  has a past surgical history that includes Total shoulder replacement (Right, 2012); Shoulder surgery (Left, 2008, 2009); Tonsillectomy; Bone graft hip iliac crest; Spine surgery (10/23/2017); Ventral hernia repair (N/A, 05/29/2018); Insertion of mesh (N/A, 05/29/2018); Eye surgery; Cardiac catheterization; Breast lumpectomy (Right, 2009); Breast biopsy (Left, 2009); Breast biopsy (Right, 2009); Colonoscopy with propofol (N/A, 05/15/2020); Spinal fusion (07/24/2020); Joint replacement; Hernia repair; Cataract extraction, bilateral (Bilateral, 10/2019); Tubal ligation; Quadriceps tendon repair (Right, 12/05/2020); and right gluteus medius/minimus repair Dr. Roland Rack 11/2020 . Family: family history includes AAA (abdominal aortic aneurysm) in her father; Arthritis in her father, mother, sister, and sister; Cancer in her brother and father; Coronary artery disease (age of onset: 66) in her mother; Diabetes in her  father; Drug abuse in her daughter; Heart disease (age of onset: 65) in her mother; Hyperlipidemia in her mother, sister, and sister; Hypertension in her mother and sister; Mental retardation in her brother.  Laboratory Chemistry Profile   Renal Lab Results  Component Value Date   BUN <5 (L) 03/19/2021   CREATININE 0.72 03/19/2021   BCR 28 (H) 09/10/2019   GFR 90.84 02/06/2021   GFRAA >60 05/08/2020   GFRNONAA >60 03/19/2021    Hepatic Lab Results  Component Value Date   AST 22 03/15/2021   ALT 15 03/15/2021   ALBUMIN 3.7 03/15/2021   ALKPHOS 84 03/15/2021   LIPASE 26 03/15/2021    Electrolytes Lab Results  Component Value Date   NA 138 03/19/2021   K 3.8 03/19/2021   CL 102 03/19/2021   CALCIUM 8.8 (L) 03/19/2021   MG 2.0 05/08/2020    Bone Lab Results  Component Value Date   VD25OH 113.74 (HH) 02/06/2021    Inflammation (CRP: Acute Phase) (ESR: Chronic Phase) Lab Results  Component Value Date   CRP 0.6 05/08/2020   ESRSEDRATE 5 05/08/2020   LATICACIDVEN 0.7 03/15/2021         Note: Above Lab results reviewed.  Recent Imaging Review  DG SCOLIOSIS EVAL COMPLETE SPINE 2 OR 3 VIEWS CLINICAL DATA:  History of lumbar fusion with radiculopathy.  EXAM: DG SCOLIOSIS EVAL COMPLETE SPINE 2-3V  COMPARISON:  12/24/2019  FINDINGS: Diffuse osseous demineralization. Posterior fusion hardware present from T10 through the sacrum intact. Interval placement of posterior fusion hardware over the thoracic spine from T3-T8 intact. No significant scoliosis of the thoracic spine. Mild spondylosis throughout the spine. Stable grade 1 anterolisthesis of L4 on L5. Mild degenerative change of the hips. Remainder of the exam is unchanged.  IMPRESSION: 1. Interval placement of posterior fusion hardware over the thoracic spine from T3-T8 intact. Posterior fusion hardware from T10 through the sacrum intact and unchanged. 2. Mild spondylosis of the spine. No scoliosis. Stable  grade 1 anterolisthesis of L4 on L5.  Electronically Signed   By: Marin Olp M.D.   On: 07/07/2021 08:04 Note: Reviewed        Physical Exam  General appearance: Well nourished, well developed, and well hydrated. In no  apparent acute distress Mental status: Alert, oriented x 3 (person, place, & time)       Respiratory: No evidence of acute respiratory distress Eyes: PERLA Vitals: BP (!) 105/95   Pulse 70   Temp (!) 96.8 F (36 C)   Ht 4' 11" (1.499 m)   Wt 128 lb (58.1 kg)   SpO2 97%   BMI 25.85 kg/m  BMI: Estimated body mass index is 25.85 kg/m as calculated from the following:   Height as of this encounter: 4' 11" (1.499 m).   Weight as of this encounter: 128 lb (58.1 kg). Ideal: Patient must be at least 60 in tall to calculate ideal body weight  Assessment   Status Diagnosis  Controlled Controlled Controlled 1. Chronic knee pain (4th area of Pain) (Posterior) (Left)   2. Osteoarthritis of knee (Left)   3. Baker's cyst of knee (Left)   4. Chronic bursitis of shoulder area (Right)   5. Chronic shoulder pain (Right)   6. Shoulder blade pain (Right)      Updated Problems: Problem  Vitamin D Deficiency (Resolved)  Abscess (Resolved)  Diverticulitis of Large Intestine With Abscess (Resolved)  Oral Thrush (Resolved)  Sepsis ([facility]) (Resolved)    Plan of Care  Problem-specific:  No problem-specific Assessment & Plan notes found for this encounter.  Ms. Monya Kozakiewicz has a current medication list which includes the following long-term medication(s): atorvastatin, fluticasone, levothyroxine, pregabalin, telmisartan, and metoprolol succinate.  Pharmacotherapy (Medications Ordered): No orders of the defined types were placed in this encounter.  Orders:  Orders Placed This Encounter  Procedures   Radiofrequency,Genicular    Standing Status:   Future    Standing Expiration Date:   07/17/2022    Scheduling Instructions:     Side(s): Left Knee     Level(s):  Superior-Lateral, Superior-Medial, and Inferior-Medial Genicular Nerve(s)     Sedation: With Sedation.     Scheduling Timeframe: As soon as pre-approved    Order Specific Question:   Where will this procedure be performed?    Answer:   [facility]   Informed Consent Details: Physician/Practitioner Attestation; Transcribe to consent form and obtain patient signature    Note: Always confirm laterality of pain with Ms. Chou, before procedure. Transcribe to consent form and obtain patient signature.    Order Specific Question:   Physician/Practitioner attestation of informed consent for procedure/surgical case    Answer:   I, the physician/practitioner, attest that I have discussed with the patient the benefits, risks, side effects, alternatives, likelihood of achieving goals and potential problems during recovery for the procedure that I have provided informed consent.    Order Specific Question:   Procedure    Answer:   Radiofrequency ablation of the genicular nerves of the knee    Order Specific Question:   Physician/Practitioner performing the procedure    Answer:   Francisco A. Dossie Arbour, MD    Order Specific Question:   Indication/Reason    Answer:   Chronic knee pain    Follow-up plan:   Return for (16mn), (Clinic) procedure: (L) Genicular nerves RFA #1.     Interventional treatment options: Planned, scheduled, and/or pending:   Diagnostic left genicular nerve block #1    Under consideration:   Diagnostic right T9-10 thoracic ESI #1  Diagnostic bilateral T9, T10, T11, and T12 MMB thoracic facet RFA (DIFICULT CANDIDATE DUE TO HARDWARE)    Therapeutic/palliative (PRN):   Diagnostic bilateral T9, T10, T11, and T12 MMB thoracic facet block #3  Diagnostic/Therapeutic right trapezoid bursa injection #2        Recent Visits Date Type Provider Dept  07/03/21 Procedure visit Milinda Pointer, MD [facility]  06/20/21 Telemedicine Milinda Pointer, MD [facility]  Showing recent visits within past 90 days and meeting all other requirements Today's Visits Date Type Provider Dept  07/17/21 Office Visit Milinda Pointer, MD [facility]  Showing today's visits and meeting all other requirements Future Appointments Date Type Provider Dept  08/09/21 Appointment Milinda Pointer, MD [facility]  Showing future appointments within next 90 days and meeting all other requirements I discussed the assessment and treatment plan with the patient. The patient was provided an opportunity to ask questions and all were answered. The patient agreed with the plan and demonstrated an understanding of the instructions.  Patient advised to call back or seek an in-person evaluation if the symptoms or condition worsens.  Duration of encounter: 30 minutes.  Note by: Gaspar Cola, MD Date: 07/17/2021; Time: 5:28 PM

## 2021-07-17 ENCOUNTER — Other Ambulatory Visit: Payer: Self-pay | Admitting: Neurosurgery

## 2021-07-17 ENCOUNTER — Encounter: Payer: Self-pay | Admitting: Pain Medicine

## 2021-07-17 ENCOUNTER — Other Ambulatory Visit: Payer: Self-pay

## 2021-07-17 ENCOUNTER — Ambulatory Visit: Payer: Medicare Other | Attending: Pain Medicine | Admitting: Pain Medicine

## 2021-07-17 VITALS — BP 105/95 | HR 70 | Temp 96.8°F | Ht 59.0 in | Wt 128.0 lb

## 2021-07-17 DIAGNOSIS — M1712 Unilateral primary osteoarthritis, left knee: Secondary | ICD-10-CM | POA: Diagnosis not present

## 2021-07-17 DIAGNOSIS — M7551 Bursitis of right shoulder: Secondary | ICD-10-CM

## 2021-07-17 DIAGNOSIS — Z981 Arthrodesis status: Secondary | ICD-10-CM

## 2021-07-17 DIAGNOSIS — G8929 Other chronic pain: Secondary | ICD-10-CM | POA: Diagnosis not present

## 2021-07-17 DIAGNOSIS — M25511 Pain in right shoulder: Secondary | ICD-10-CM | POA: Diagnosis not present

## 2021-07-17 DIAGNOSIS — R2689 Other abnormalities of gait and mobility: Secondary | ICD-10-CM

## 2021-07-17 DIAGNOSIS — M25562 Pain in left knee: Secondary | ICD-10-CM

## 2021-07-17 DIAGNOSIS — M898X1 Other specified disorders of bone, shoulder: Secondary | ICD-10-CM

## 2021-07-17 DIAGNOSIS — M7122 Synovial cyst of popliteal space [Baker], left knee: Secondary | ICD-10-CM | POA: Diagnosis not present

## 2021-07-17 NOTE — Progress Notes (Signed)
Safety precautions to be maintained throughout the outpatient stay will include: orient to surroundings, keep bed in low position, maintain call bell within reach at all times, provide assistance with transfer out of bed and ambulation.  

## 2021-07-17 NOTE — Patient Instructions (Signed)

## 2021-07-26 ENCOUNTER — Ambulatory Visit
Admission: RE | Admit: 2021-07-26 | Discharge: 2021-07-26 | Disposition: A | Discharge status: Home / Self Care | Payer: Medicare Other | Source: Ambulatory Visit | Attending: Neurosurgery | Admitting: Neurosurgery

## 2021-07-26 ENCOUNTER — Other Ambulatory Visit: Payer: Self-pay

## 2021-07-26 DIAGNOSIS — M4326 Fusion of spine, lumbar region: Secondary | ICD-10-CM | POA: Diagnosis not present

## 2021-07-26 DIAGNOSIS — M4325 Fusion of spine, thoracolumbar region: Secondary | ICD-10-CM | POA: Diagnosis not present

## 2021-07-26 DIAGNOSIS — R2689 Other abnormalities of gait and mobility: Secondary | ICD-10-CM | POA: Diagnosis not present

## 2021-07-26 DIAGNOSIS — Z981 Arthrodesis status: Secondary | ICD-10-CM

## 2021-07-26 DIAGNOSIS — M4324 Fusion of spine, thoracic region: Secondary | ICD-10-CM | POA: Diagnosis not present

## 2021-07-26 DIAGNOSIS — M2578 Osteophyte, vertebrae: Secondary | ICD-10-CM | POA: Diagnosis not present

## 2021-07-26 DIAGNOSIS — M545 Low back pain, unspecified: Secondary | ICD-10-CM | POA: Diagnosis not present

## 2021-07-26 DIAGNOSIS — M5124 Other intervertebral disc displacement, thoracic region: Secondary | ICD-10-CM | POA: Diagnosis not present

## 2021-07-26 IMAGING — RF DG MYELOGRAM LUMBAR
9 series · 9 of 9 positions shown · non-contrast
Comparison: Thoracic spine CT [DATE]; CT myelogram [DATE]

CLINICAL DATA: Post spinal fusion, imbalance; patient has history
of spinal fusion and complaints of mid and lower back pain that
extends to the buttock region, patient denies any symptoms radiating
into the lower extremity however does admit to numbness in the lower
extremities bilaterally.
TECHNIQUE: Contiguous axial images were obtained through the Thoracic and
Lumbar spine after the intrathecal infusion of infusion. Coronal and
sagittal reconstructions were obtained of the axial image sets.

[Series 8: fluoro_myelogram_singleshot_bw · 0.20mm/px · 1 of 1 slices shown (1 of 7)]
[im 1/1]
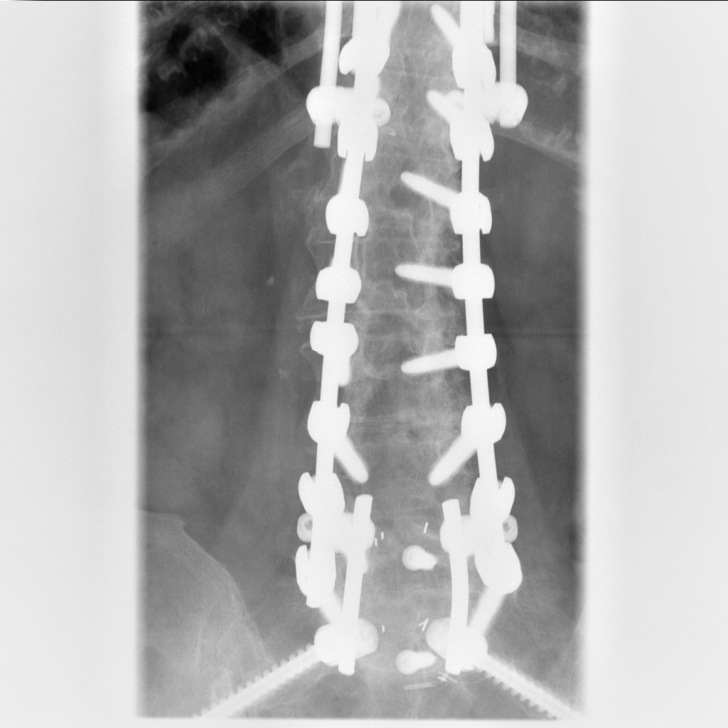

[Series 9: cp_standard · 0.20mm/px · 1 of 1 slices shown]
[im 1/1]
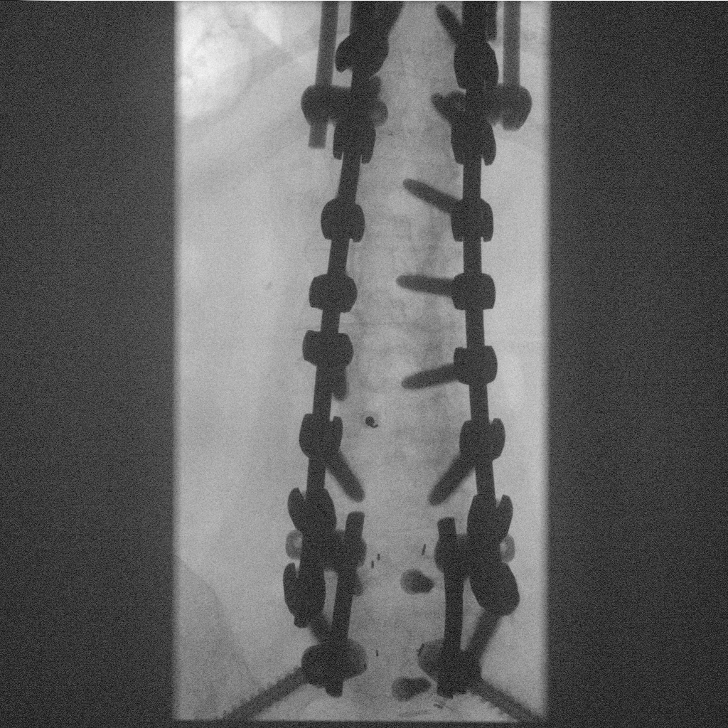

[Series 10: fluoro_myelogram_singleshot_bw · 0.20mm/px · 1 of 1 slices shown (2 of 7)]
[im 1/1]
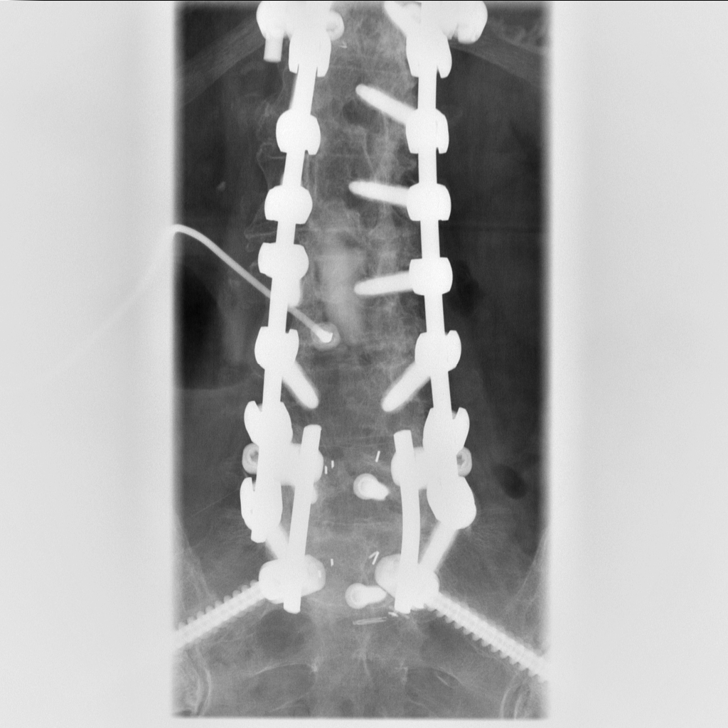

[Series 11: fluoro_myelogram_singleshot_bw · 0.20mm/px · 1 of 1 slices shown (3 of 7)]
[im 1/1]
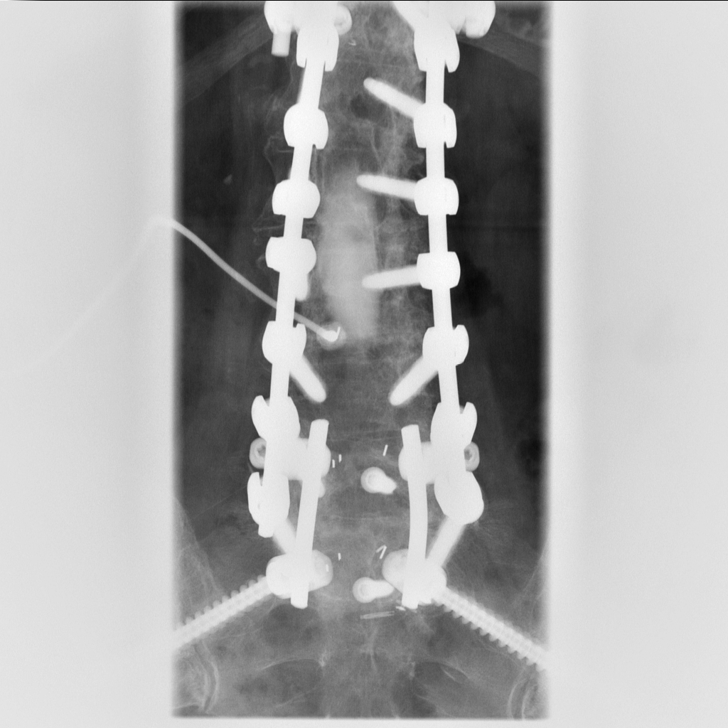

[Series 12: fluoro_myelogram_singleshot_bw · 0.20mm/px · 1 of 1 slices shown (4 of 7)]
[im 1/1]
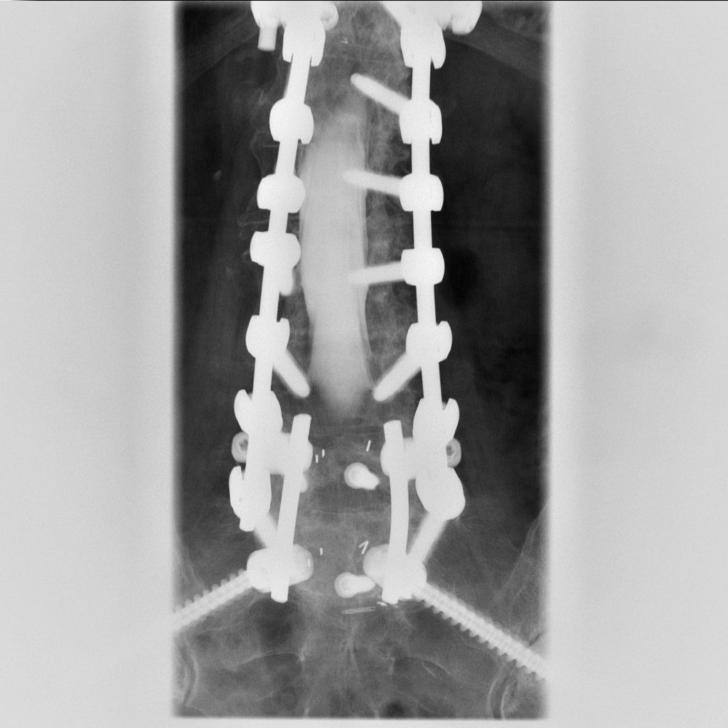

[Series 13: fluoro_myelogram_singleshot_bw · 0.20mm/px · 1 of 1 slices shown (5 of 7)]
[im 1/1]
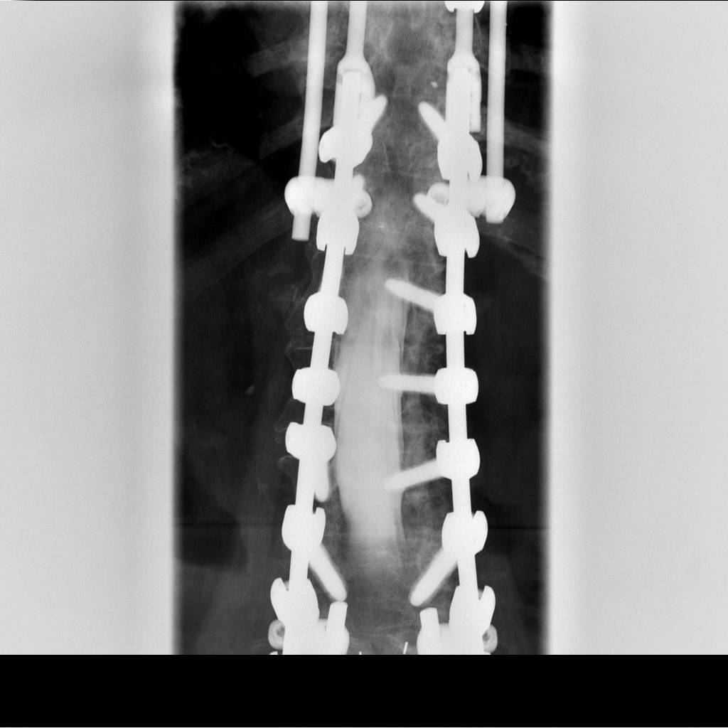

[Series 14: fluoro_myelogram_singleshot_bw · 0.20mm/px · 1 of 1 slices shown (6 of 7)]
[im 1/1]
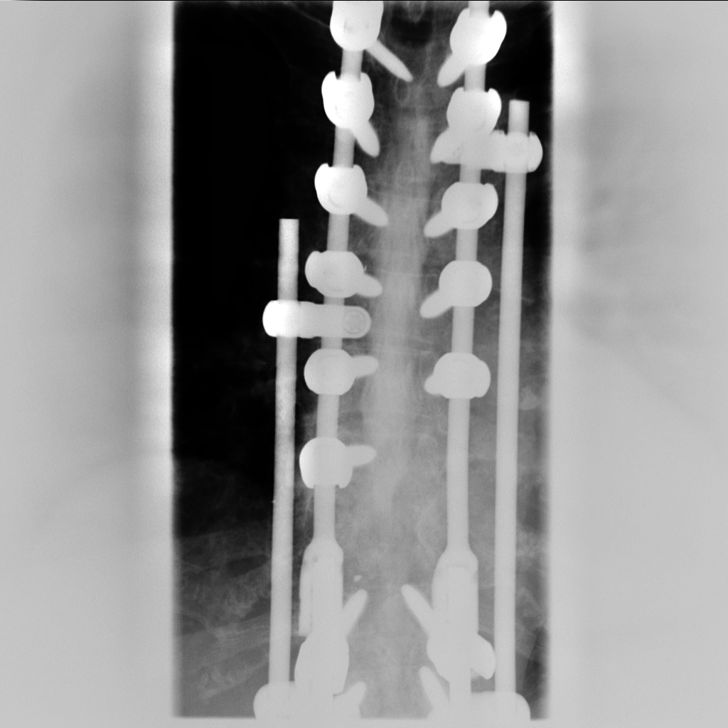

[Series 15: fluoro_myelogram_singleshot_bw · 0.20mm/px · 1 of 1 slices shown (7 of 7)]
[im 1/1]
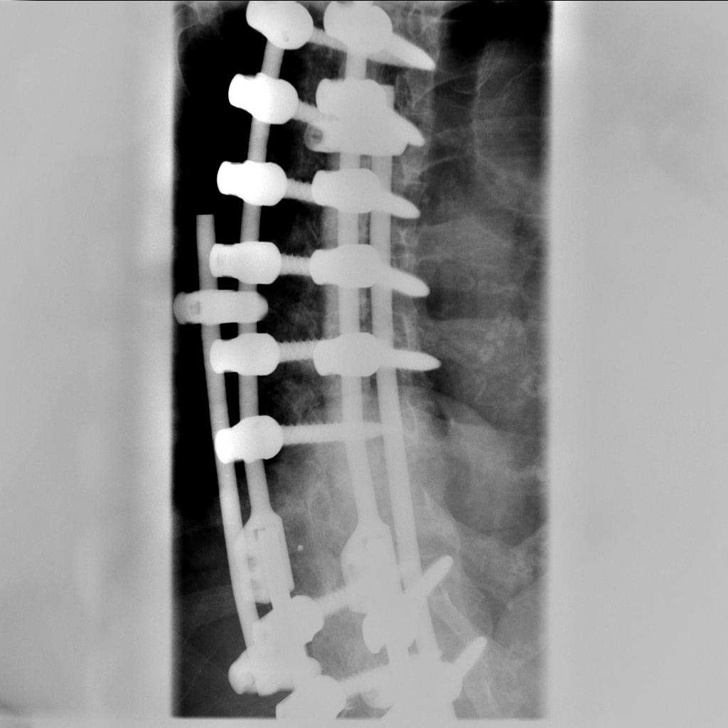

[Series 17: w lumbar spine lat · 0.14mm/px · 1 of 1 slices shown]
[im 1/1]
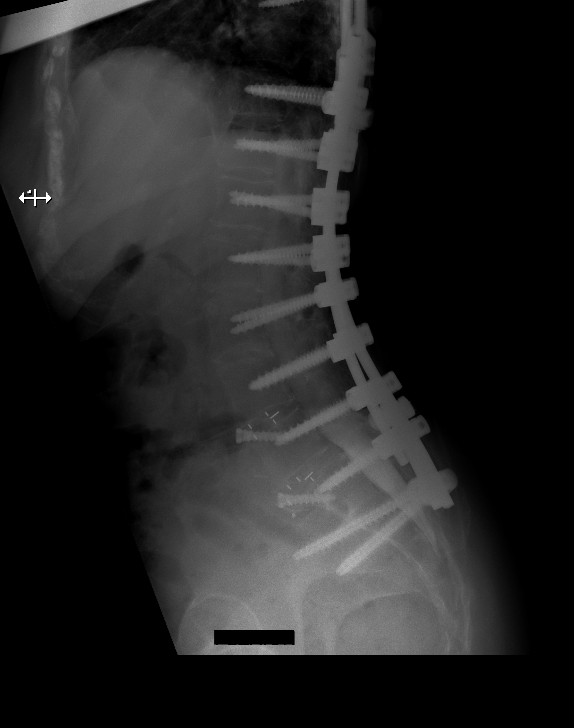

[9 of 9 positions shown; findings below may reference images not displayed]

EXAM:
LUMBAR AND THORACIC MYELOGRAM

FLUOROSCOPY TIME:  30 seconds; 4.9 mGy

PROCEDURE:
LUMBAR PUNCTURE FOR THORACIC AND LUMBAR MYELOGRAM

After thorough discussion of risks and benefits of the procedure
including bleeding, infection, seizures, injury to nerves, blood
vessels, adjacent structures as well as headache and CSF leak,
written and oral informed consent was obtained. Consent was obtained
by ITTI. Time out form was completed.

Patient was positioned prone on the fluoroscopy table. Local
anesthesia was provided with 1% lidocaine without epinephrine after
prepped and draped in the usual sterile fashion. Puncture was
performed at L2-L3 using a midline approach with a 3 1/2 inch
22-gauge spinal needle. Using a single pass through the dura, the
needle was placed within the thecal sac, with return of clear CSF.
10 mL of Omnipaque 300 was injected into the thecal sac, with normal
opacification of the nerve roots and cauda equina consistent with
free flow within the subarachnoid space. Procedure performed by ITTI
ITTI.
FINDINGS: THORACIC AND LUMBAR MYELOGRAM FINDINGS:

Extensive fusion construct involving thoracolumbar spine. Spinal
canal appears widely patent.

CT THORACIC MYELOGRAM FINDINGS:

Extensive fusion construct extending inferiorly from T3 with rods
and pedicle screws. There are prior pedicle screws tracts at T9.
There is no left T8 pedicle screw. This is unchanged from the
[DATE] study. No evidence of hardware complication. Similar
vertebral body heights and alignment. Degenerative endplate
irregularity at thoracic levels above the fusion and at T8-T9.
Partially imaged lower cervical spine degenerative changes without
significant canal narrowing.

There is adequate opacification of the subarachnoid space. Greater
artifact is present as the construct was extended in comparison to
the prior myelogram. Disc protrusions at T6-T7 and T8-T9 are no
longer present. Minimal disc bulge with endplate osteophytes again
present at T1-T2. Minimal disc bulge at other levels such as T2-T3.
There is no new disc herniation. No significant canal narrowing at
any level. Facet spurring mildly encroaches on the neural foramina,
greatest on the left at T10-T11.

CT LUMBAR MYELOGRAM FINDINGS:

Extensive fusion construct involves all lumbar levels as well as S1
and bilateral iliac extenders. Single ventral screws are also
present at L5 and S1 with interbody fusion devices at L4-L5 and
L5-S1. No evidence of hardware complication. Posterior decompression
is present from L3-L5.

There is adequate opacification of the subarachnoid space. Conus
terminates at L1-L2. There is no new disc herniation. Small endplate
osteophytes are present. There is facet spurring. No new or
progressive foraminal narrowing.
IMPRESSION: Extensive fusion construct extending from T3 to the sacrum. No
evidence of hardware complication.

Disc protrusions at T6-T7 and T8-T9 are no longer present. No new
disc herniation or progressive stenosis at thoracic or lumbar
levels.

## 2021-07-26 IMAGING — CT CT L SPINE W/ CM
3 of 4 series · 11 of 33 positions shown, 13 images · non-contrast
Comparison: Thoracic spine CT [DATE]; CT myelogram [DATE]

CLINICAL DATA: Post spinal fusion, imbalance; patient has history
of spinal fusion and complaints of mid and lower back pain that
extends to the buttock region, patient denies any symptoms radiating
into the lower extremity however does admit to numbness in the lower
extremities bilaterally.
TECHNIQUE: Contiguous axial images were obtained through the Thoracic and
Lumbar spine after the intrathecal infusion of infusion. Coronal and
sagittal reconstructions were obtained of the axial image sets.

[Series 7: sagittal st · sagittal · 0.36mm/px · 5 of 91 slices shown, 6 images]
[im 31/91  bone]
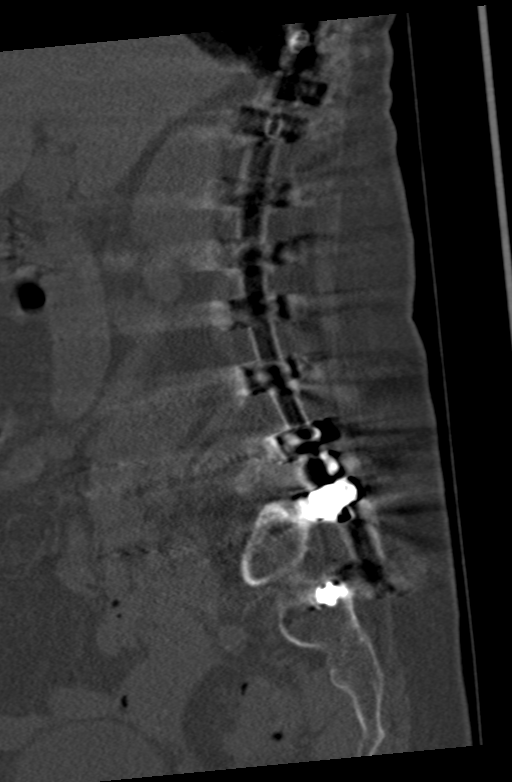
[im 38/91  bone]
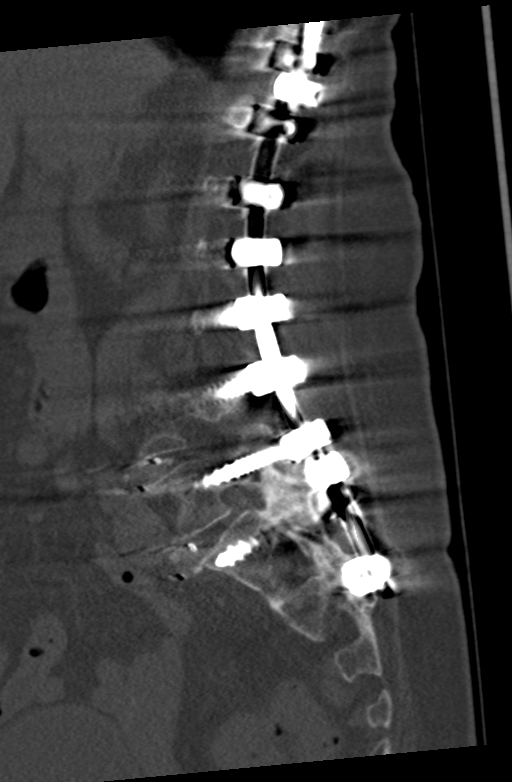
[im 46/91  soft-tissue]
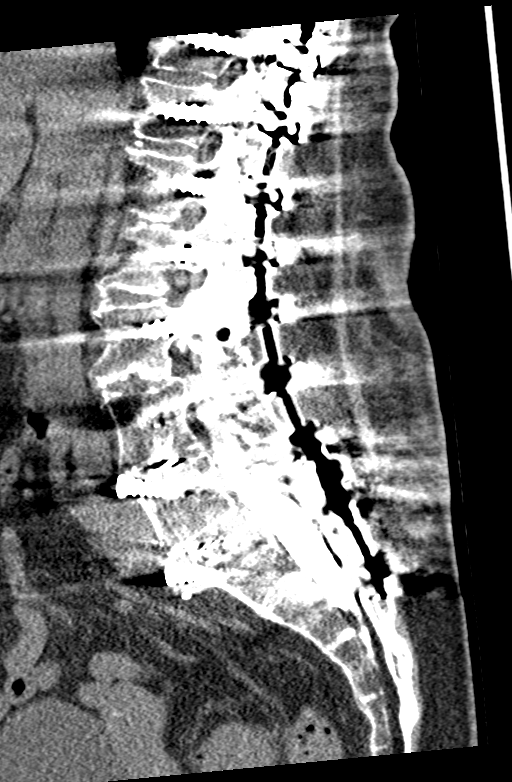
[im 46/91  bone]
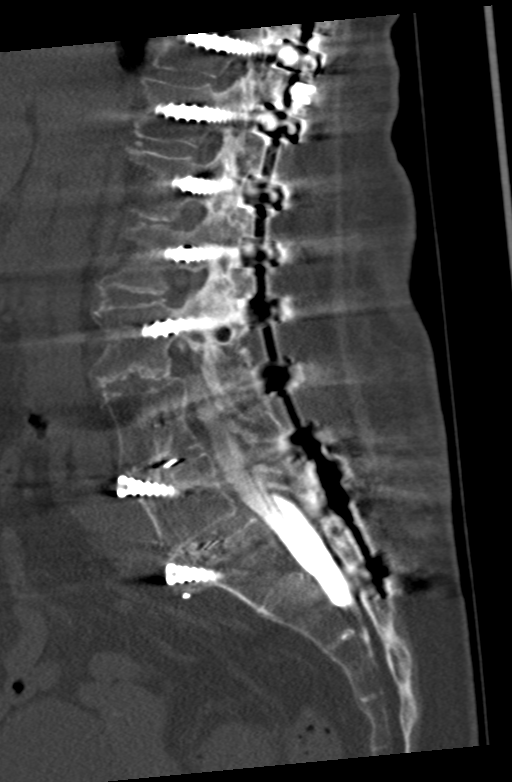
[im 53/91  bone]
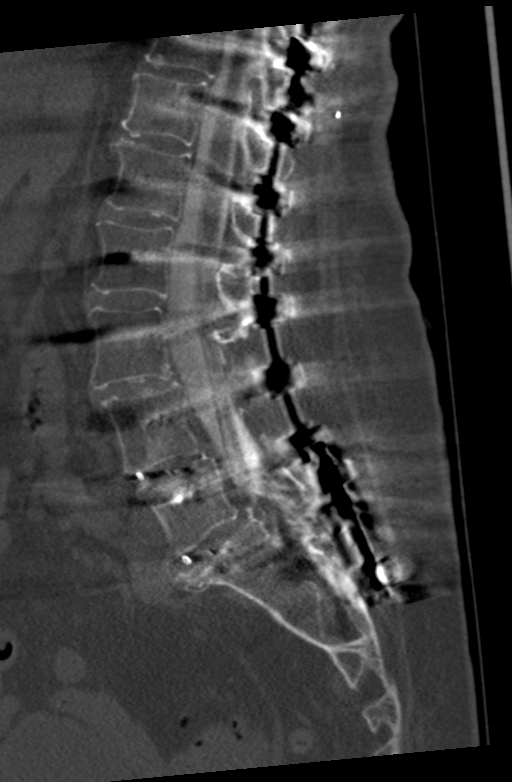
[im 61/91  bone]
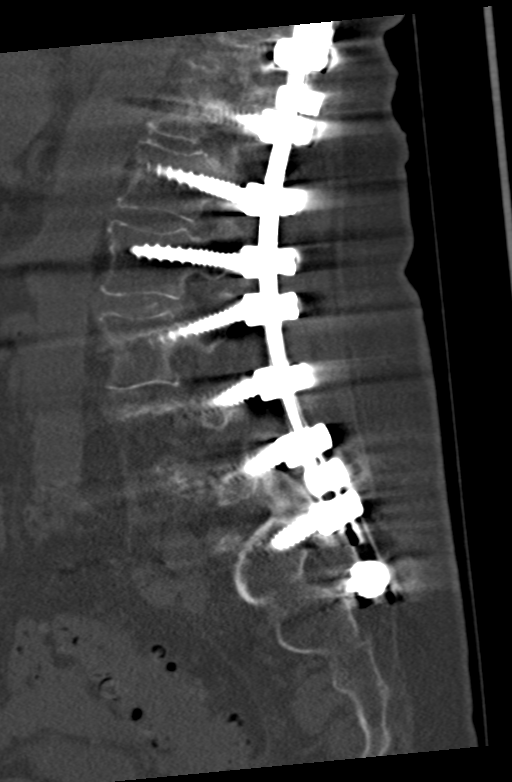

[Series 8: coronal bone · coronal · 0.35mm/px · 3 of 94 slices shown]
[im 19/94  bone]
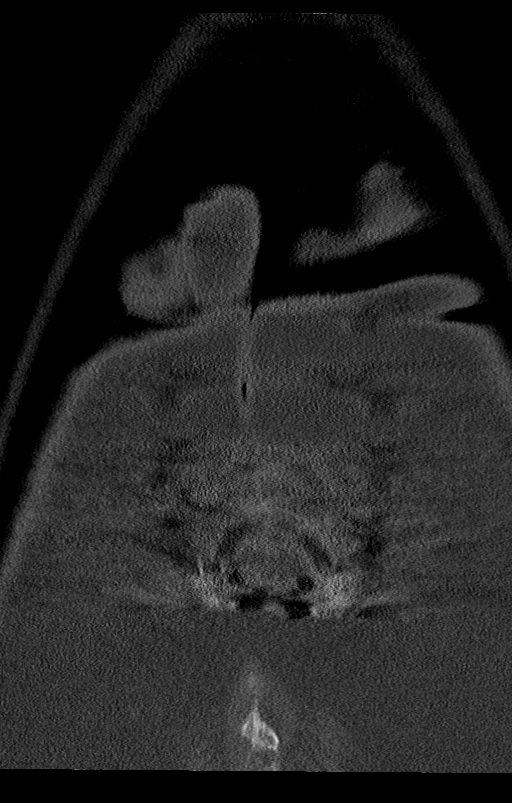
[im 38/94  bone]
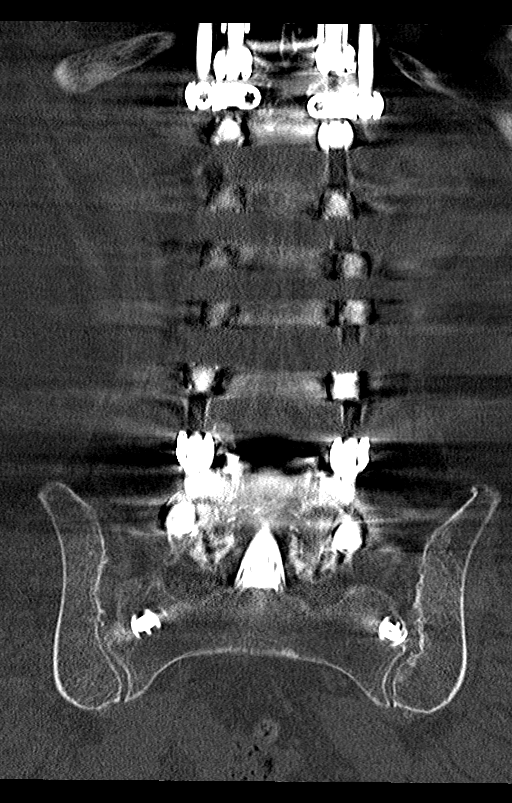
[im 56/94  bone]
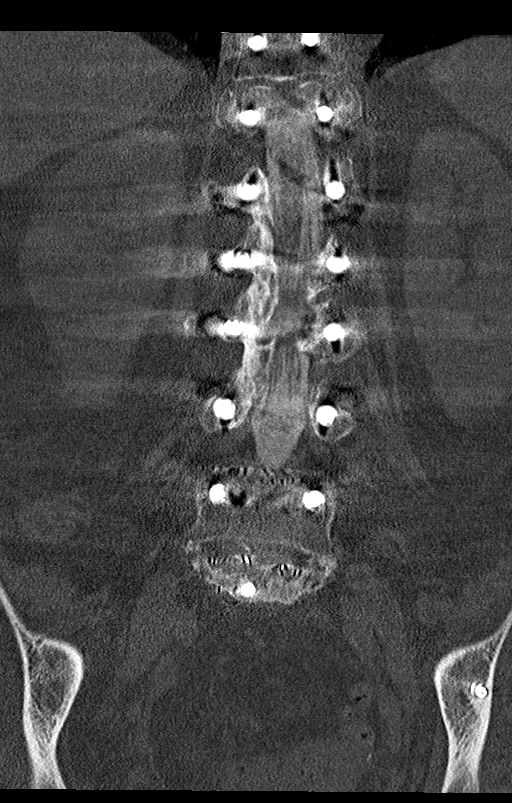

[Series 9: multi disc · axial · 0.21mm/px · z∈[-718,-571]mm · 3 of 113 slices shown, 4 images]
[im 23/113  soft-tissue]
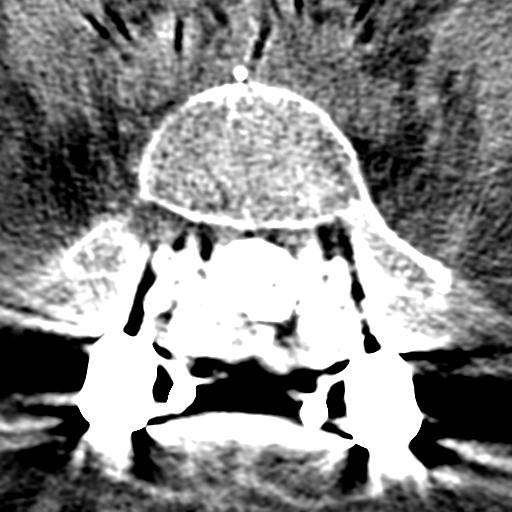
[im 23/113  bone]
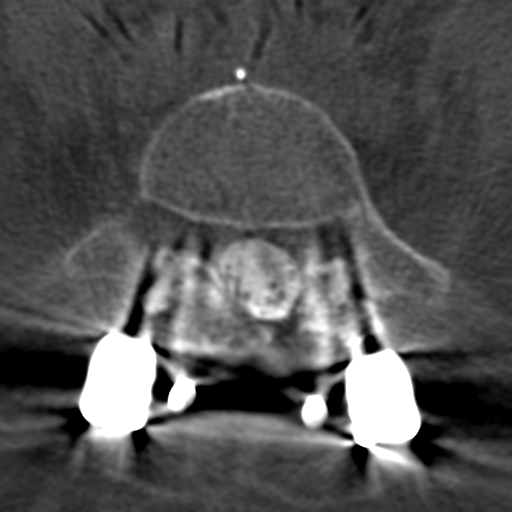
[im 68/113  bone]
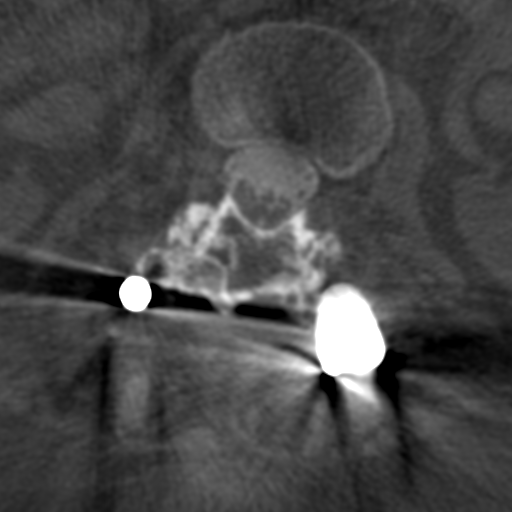
[im 90/113  bone]
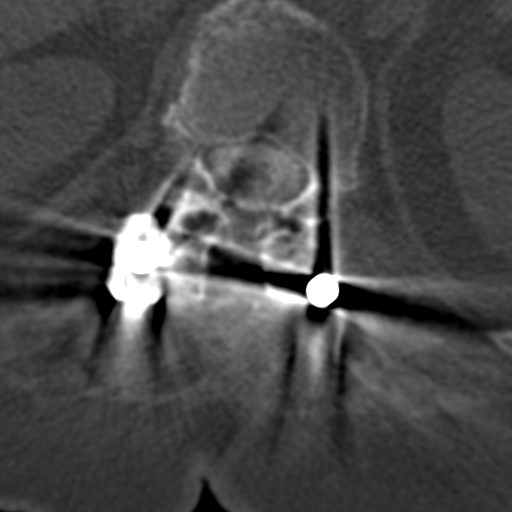

[11 of 33 positions shown; findings below may reference images not displayed]

EXAM:
LUMBAR AND THORACIC MYELOGRAM

FLUOROSCOPY TIME:  30 seconds; 4.9 mGy

PROCEDURE:
LUMBAR PUNCTURE FOR THORACIC AND LUMBAR MYELOGRAM

After thorough discussion of risks and benefits of the procedure
including bleeding, infection, seizures, injury to nerves, blood
vessels, adjacent structures as well as headache and CSF leak,
written and oral informed consent was obtained. Consent was obtained
by ITTI. Time out form was completed.

Patient was positioned prone on the fluoroscopy table. Local
anesthesia was provided with 1% lidocaine without epinephrine after
prepped and draped in the usual sterile fashion. Puncture was
performed at L2-L3 using a midline approach with a 3 1/2 inch
22-gauge spinal needle. Using a single pass through the dura, the
needle was placed within the thecal sac, with return of clear CSF.
10 mL of Omnipaque 300 was injected into the thecal sac, with normal
opacification of the nerve roots and cauda equina consistent with
free flow within the subarachnoid space. Procedure performed by ITTI
ITTI.
FINDINGS: THORACIC AND LUMBAR MYELOGRAM FINDINGS:

Extensive fusion construct involving thoracolumbar spine. Spinal
canal appears widely patent.

CT THORACIC MYELOGRAM FINDINGS:

Extensive fusion construct extending inferiorly from T3 with rods
and pedicle screws. There are prior pedicle screws tracts at T9.
There is no left T8 pedicle screw. This is unchanged from the
[DATE] study. No evidence of hardware complication. Similar
vertebral body heights and alignment. Degenerative endplate
irregularity at thoracic levels above the fusion and at T8-T9.
Partially imaged lower cervical spine degenerative changes without
significant canal narrowing.

There is adequate opacification of the subarachnoid space. Greater
artifact is present as the construct was extended in comparison to
the prior myelogram. Disc protrusions at T6-T7 and T8-T9 are no
longer present. Minimal disc bulge with endplate osteophytes again
present at T1-T2. Minimal disc bulge at other levels such as T2-T3.
There is no new disc herniation. No significant canal narrowing at
any level. Facet spurring mildly encroaches on the neural foramina,
greatest on the left at T10-T11.

CT LUMBAR MYELOGRAM FINDINGS:

Extensive fusion construct involves all lumbar levels as well as S1
and bilateral iliac extenders. Single ventral screws are also
present at L5 and S1 with interbody fusion devices at L4-L5 and
L5-S1. No evidence of hardware complication. Posterior decompression
is present from L3-L5.

There is adequate opacification of the subarachnoid space. Conus
terminates at L1-L2. There is no new disc herniation. Small endplate
osteophytes are present. There is facet spurring. No new or
progressive foraminal narrowing.
IMPRESSION: Extensive fusion construct extending from T3 to the sacrum. No
evidence of hardware complication.

Disc protrusions at T6-T7 and T8-T9 are no longer present. No new
disc herniation or progressive stenosis at thoracic or lumbar
levels.

## 2021-07-26 IMAGING — RF DG MYELOGRAM THORACIC
9 series · 9 of 9 positions shown · non-contrast
Comparison: Thoracic spine CT [DATE]; CT myelogram [DATE]

CLINICAL DATA: Post spinal fusion, imbalance; patient has history
of spinal fusion and complaints of mid and lower back pain that
extends to the buttock region, patient denies any symptoms radiating
into the lower extremity however does admit to numbness in the lower
extremities bilaterally.
TECHNIQUE: Contiguous axial images were obtained through the Thoracic and
Lumbar spine after the intrathecal infusion of infusion. Coronal and
sagittal reconstructions were obtained of the axial image sets.

[Series 8: fluoro_myelogram_singleshot_bw · 0.20mm/px · 1 of 1 slices shown (1 of 7)]
[im 1/1]
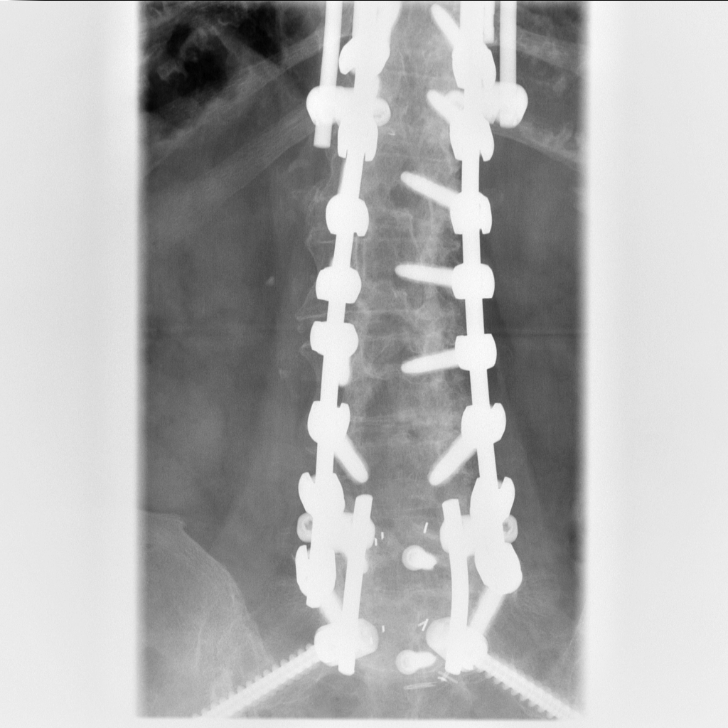

[Series 9: cp_standard · 0.20mm/px · 1 of 1 slices shown]
[im 1/1]
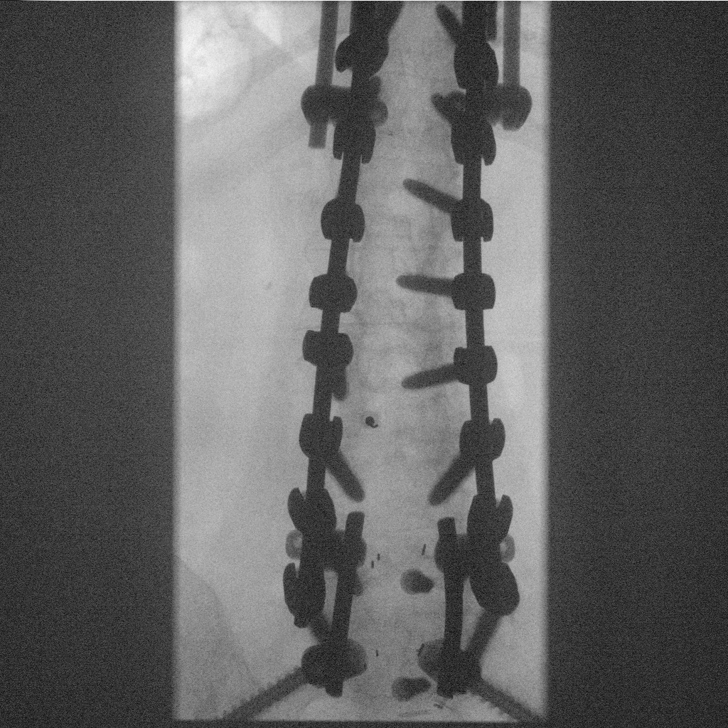

[Series 10: fluoro_myelogram_singleshot_bw · 0.20mm/px · 1 of 1 slices shown (2 of 7)]
[im 1/1]
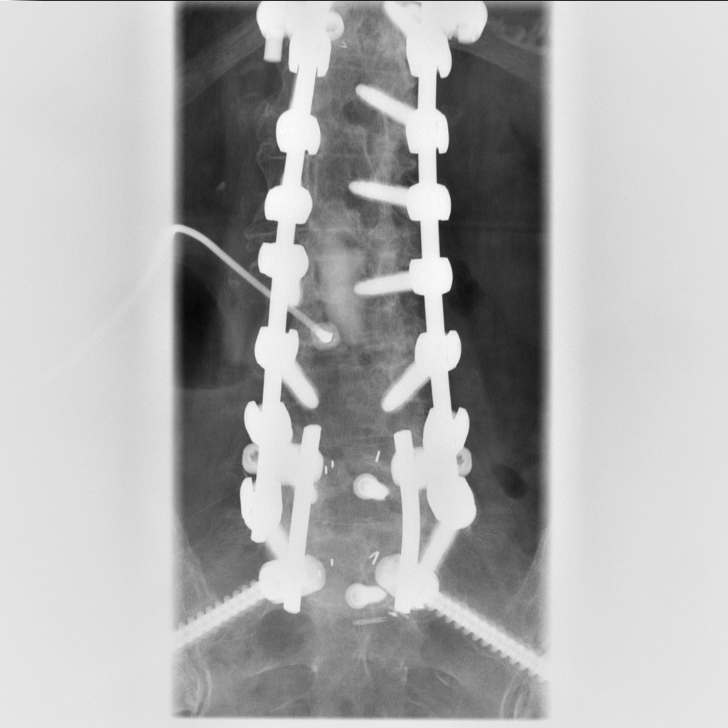

[Series 11: fluoro_myelogram_singleshot_bw · 0.20mm/px · 1 of 1 slices shown (3 of 7)]
[im 1/1]
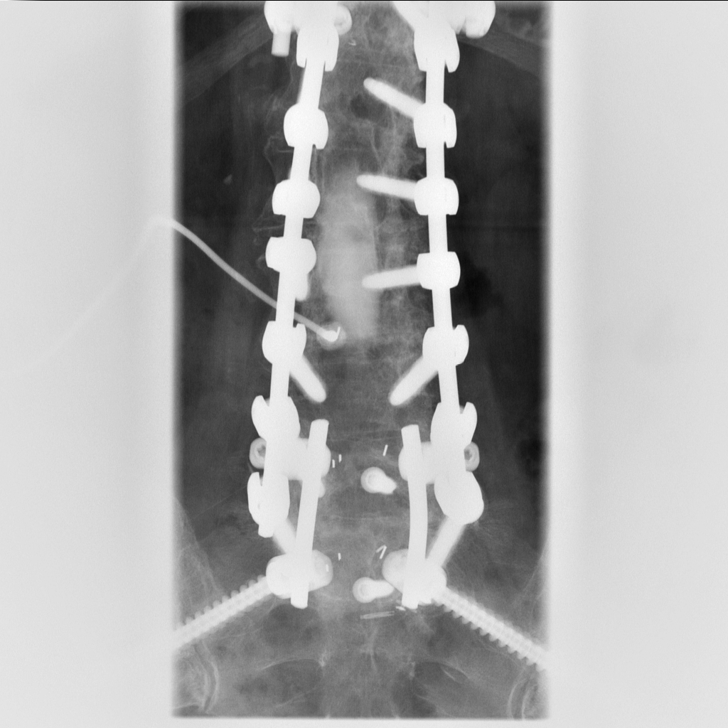

[Series 12: fluoro_myelogram_singleshot_bw · 0.20mm/px · 1 of 1 slices shown (4 of 7)]
[im 1/1]
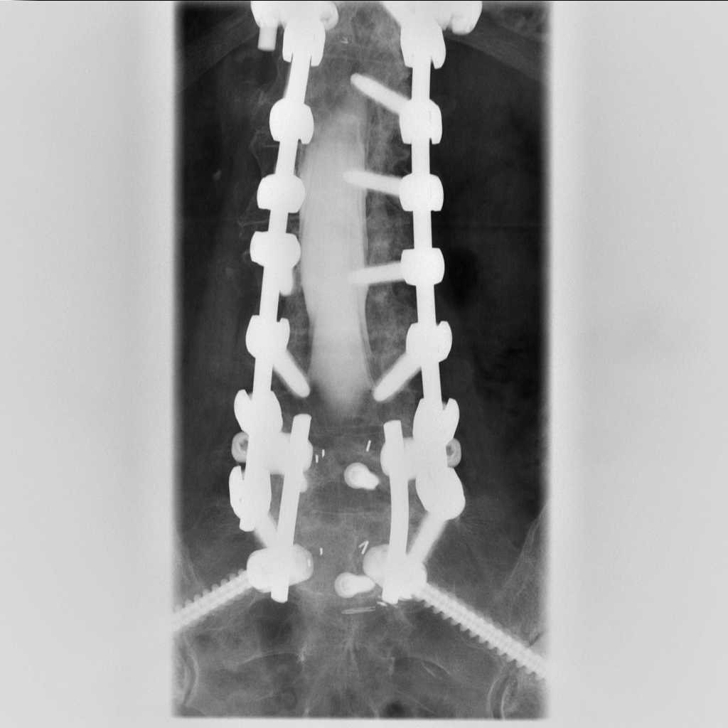

[Series 13: fluoro_myelogram_singleshot_bw · 0.20mm/px · 1 of 1 slices shown (5 of 7)]
[im 1/1]
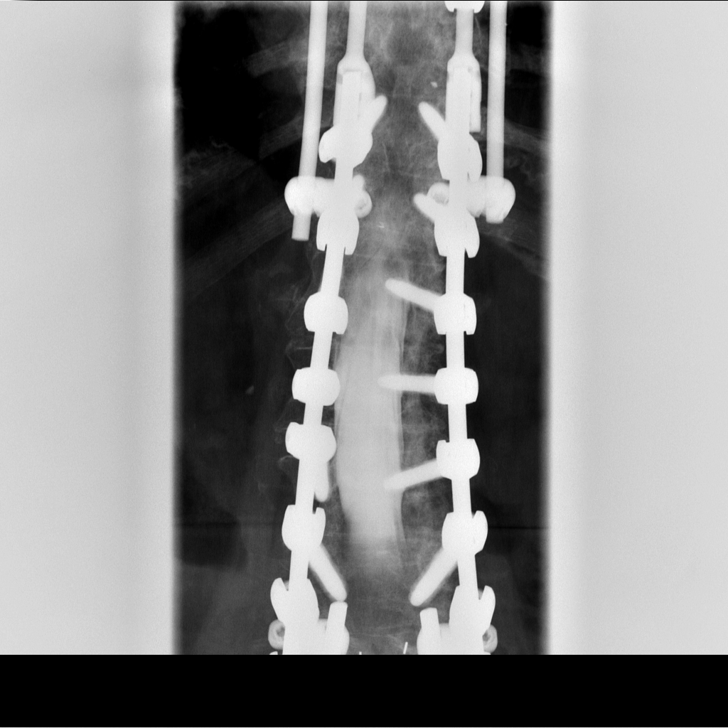

[Series 14: fluoro_myelogram_singleshot_bw · 0.20mm/px · 1 of 1 slices shown (6 of 7)]
[im 1/1]
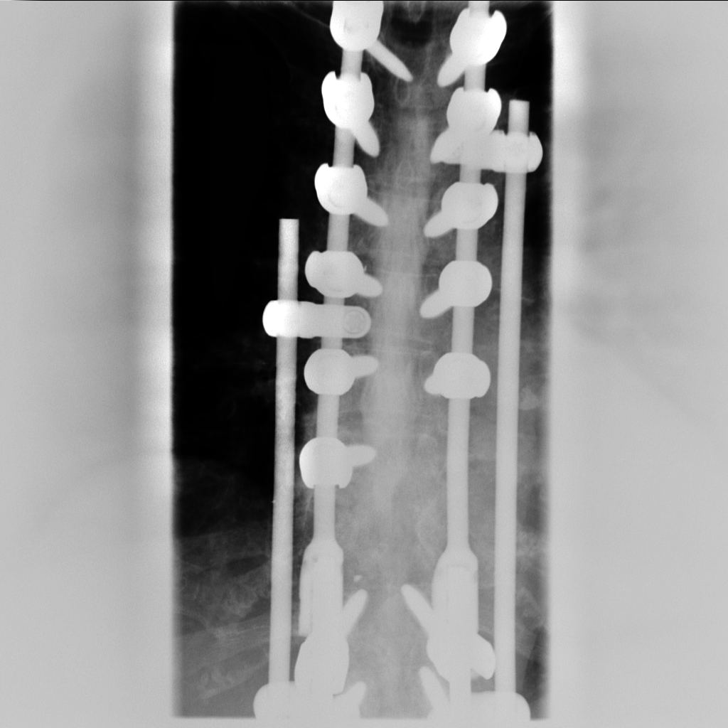

[Series 15: fluoro_myelogram_singleshot_bw · 0.20mm/px · 1 of 1 slices shown (7 of 7)]
[im 1/1]
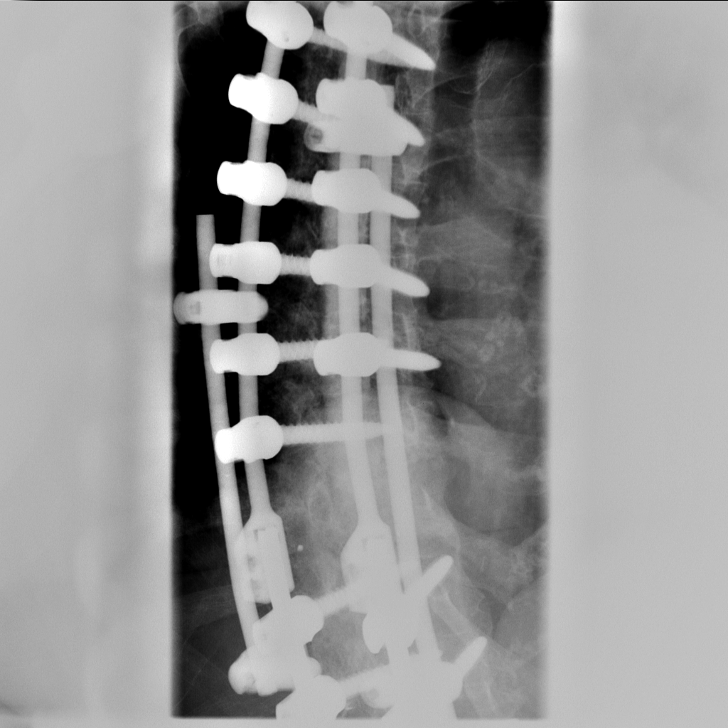

[Series 17: w lumbar spine lat · 0.14mm/px · 1 of 1 slices shown]
[im 1/1]
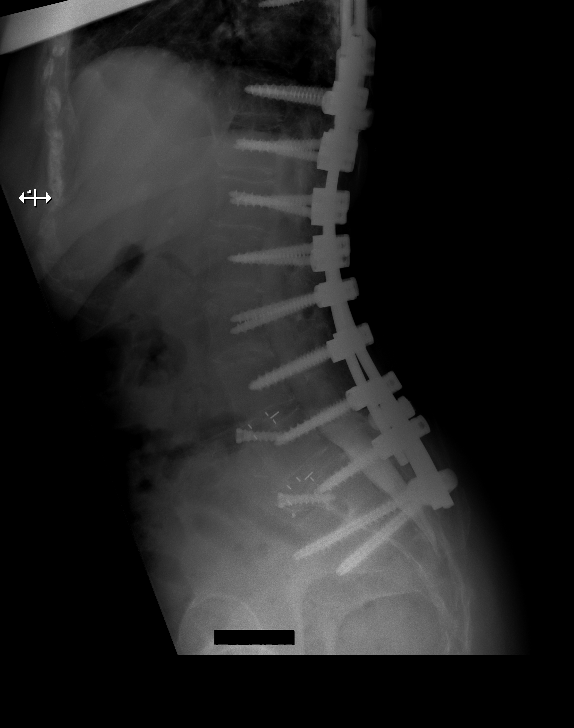

[9 of 9 positions shown; findings below may reference images not displayed]

EXAM:
LUMBAR AND THORACIC MYELOGRAM

FLUOROSCOPY TIME:  30 seconds; 4.9 mGy

PROCEDURE:
LUMBAR PUNCTURE FOR THORACIC AND LUMBAR MYELOGRAM

After thorough discussion of risks and benefits of the procedure
including bleeding, infection, seizures, injury to nerves, blood
vessels, adjacent structures as well as headache and CSF leak,
written and oral informed consent was obtained. Consent was obtained
by ITTI. Time out form was completed.

Patient was positioned prone on the fluoroscopy table. Local
anesthesia was provided with 1% lidocaine without epinephrine after
prepped and draped in the usual sterile fashion. Puncture was
performed at L2-L3 using a midline approach with a 3 1/2 inch
22-gauge spinal needle. Using a single pass through the dura, the
needle was placed within the thecal sac, with return of clear CSF.
10 mL of Omnipaque 300 was injected into the thecal sac, with normal
opacification of the nerve roots and cauda equina consistent with
free flow within the subarachnoid space. Procedure performed by ITTI
ITTI.
FINDINGS: THORACIC AND LUMBAR MYELOGRAM FINDINGS:

Extensive fusion construct involving thoracolumbar spine. Spinal
canal appears widely patent.

CT THORACIC MYELOGRAM FINDINGS:

Extensive fusion construct extending inferiorly from T3 with rods
and pedicle screws. There are prior pedicle screws tracts at T9.
There is no left T8 pedicle screw. This is unchanged from the
[DATE] study. No evidence of hardware complication. Similar
vertebral body heights and alignment. Degenerative endplate
irregularity at thoracic levels above the fusion and at T8-T9.
Partially imaged lower cervical spine degenerative changes without
significant canal narrowing.

There is adequate opacification of the subarachnoid space. Greater
artifact is present as the construct was extended in comparison to
the prior myelogram. Disc protrusions at T6-T7 and T8-T9 are no
longer present. Minimal disc bulge with endplate osteophytes again
present at T1-T2. Minimal disc bulge at other levels such as T2-T3.
There is no new disc herniation. No significant canal narrowing at
any level. Facet spurring mildly encroaches on the neural foramina,
greatest on the left at T10-T11.

CT LUMBAR MYELOGRAM FINDINGS:

Extensive fusion construct involves all lumbar levels as well as S1
and bilateral iliac extenders. Single ventral screws are also
present at L5 and S1 with interbody fusion devices at L4-L5 and
L5-S1. No evidence of hardware complication. Posterior decompression
is present from L3-L5.

There is adequate opacification of the subarachnoid space. Conus
terminates at L1-L2. There is no new disc herniation. Small endplate
osteophytes are present. There is facet spurring. No new or
progressive foraminal narrowing.
IMPRESSION: Extensive fusion construct extending from T3 to the sacrum. No
evidence of hardware complication.

Disc protrusions at T6-T7 and T8-T9 are no longer present. No new
disc herniation or progressive stenosis at thoracic or lumbar
levels.

## 2021-07-26 IMAGING — CT CT T SPINE W/ CM
3 of 23 series · 7 of 38 positions shown, 8 images · non-contrast
Comparison: Thoracic spine CT [DATE]; CT myelogram [DATE]

CLINICAL DATA: Post spinal fusion, imbalance; patient has history
of spinal fusion and complaints of mid and lower back pain that
extends to the buttock region, patient denies any symptoms radiating
into the lower extremity however does admit to numbness in the lower
extremities bilaterally.
TECHNIQUE: Contiguous axial images were obtained through the Thoracic and
Lumbar spine after the intrathecal infusion of infusion. Coronal and
sagittal reconstructions were obtained of the axial image sets.

[Series 7: sagittal bone · sagittal · 0.34mm/px · 5 of 82 slices shown]
[im 14/82  bone]
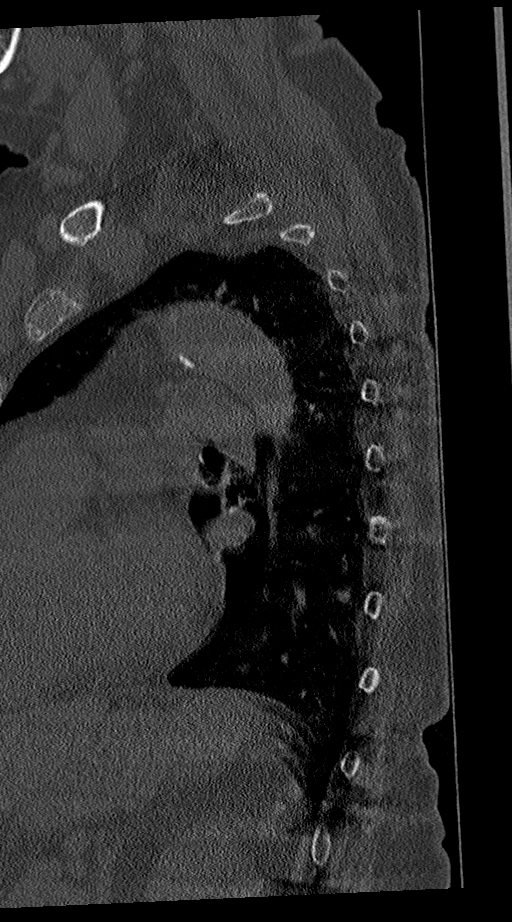
[im 28/82  bone]
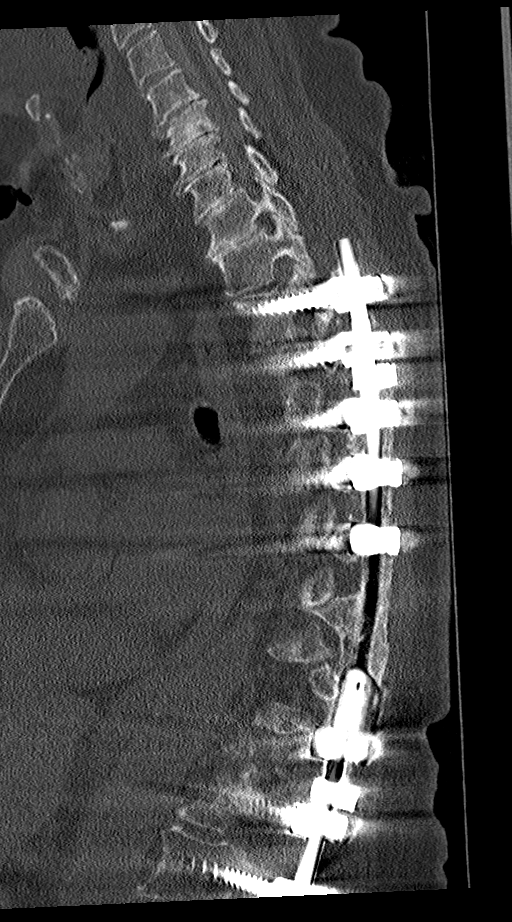
[im 41/82  bone]
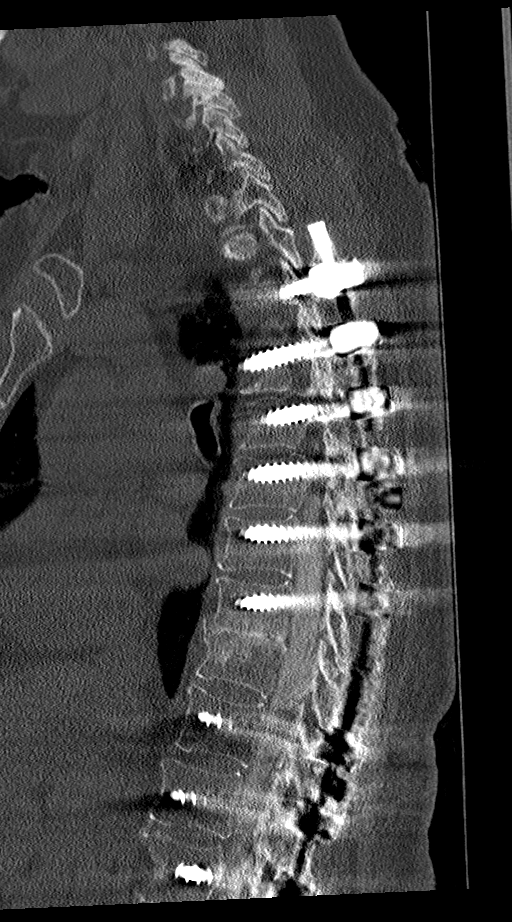
[im 55/82  bone]
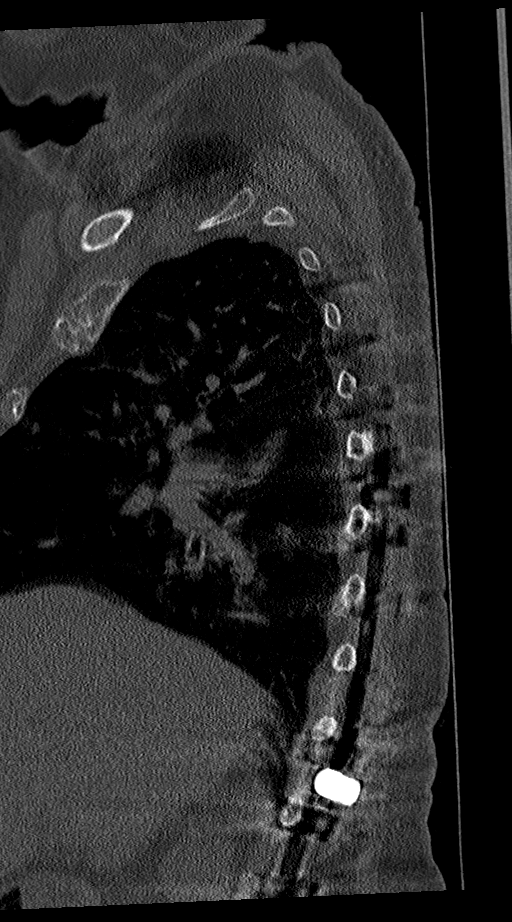
[im 68/82  bone]
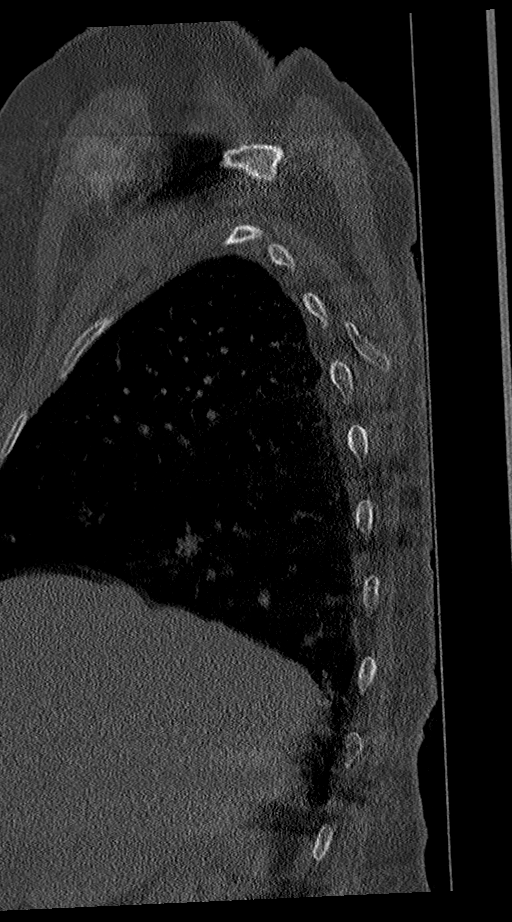

[Series 13: coronal bone · coronal · 0.29mm/px · 1 of 67 slices shown]
[im 34/67  bone]
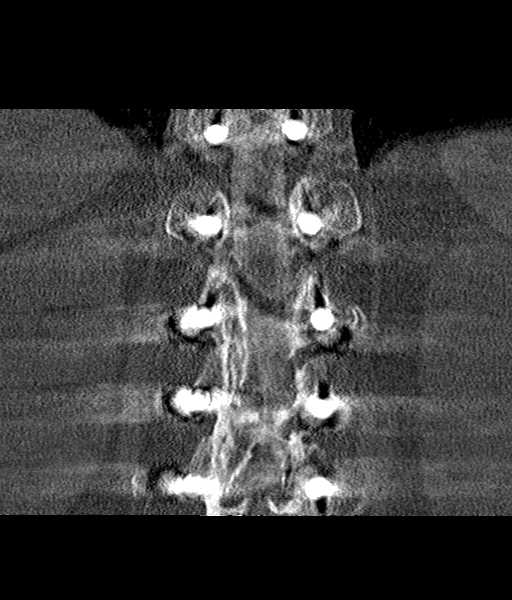

[Series 14: multidisc · axial · 0.24mm/px · z∈[-658,-658]mm · 1 of 64 slices shown, 2 images]
[im 1/64  soft-tissue]
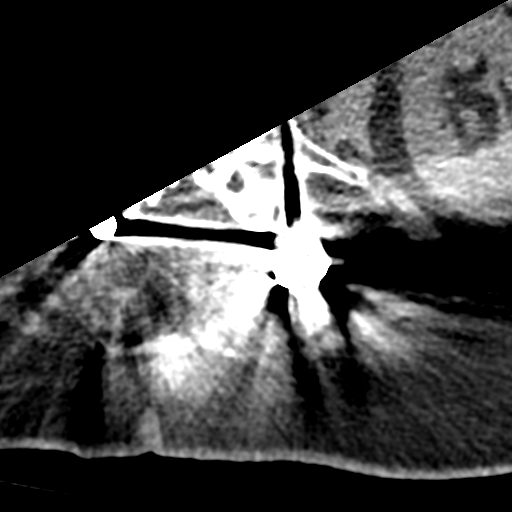
[im 1/64  bone]
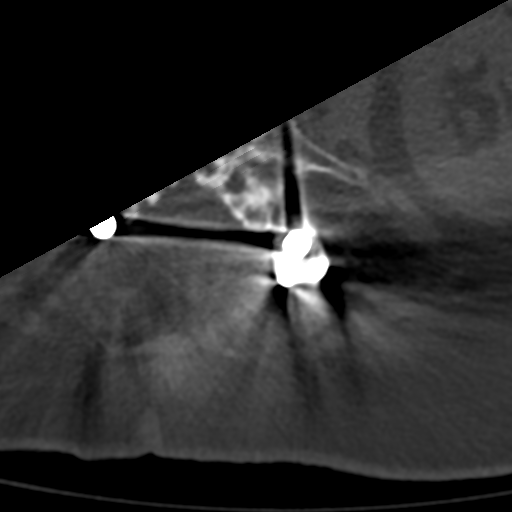

[7 of 38 positions shown; findings below may reference images not displayed]

EXAM:
LUMBAR AND THORACIC MYELOGRAM

FLUOROSCOPY TIME:  30 seconds; 4.9 mGy

PROCEDURE:
LUMBAR PUNCTURE FOR THORACIC AND LUMBAR MYELOGRAM

After thorough discussion of risks and benefits of the procedure
including bleeding, infection, seizures, injury to nerves, blood
vessels, adjacent structures as well as headache and CSF leak,
written and oral informed consent was obtained. Consent was obtained
by ITTI. Time out form was completed.

Patient was positioned prone on the fluoroscopy table. Local
anesthesia was provided with 1% lidocaine without epinephrine after
prepped and draped in the usual sterile fashion. Puncture was
performed at L2-L3 using a midline approach with a 3 1/2 inch
22-gauge spinal needle. Using a single pass through the dura, the
needle was placed within the thecal sac, with return of clear CSF.
10 mL of Omnipaque 300 was injected into the thecal sac, with normal
opacification of the nerve roots and cauda equina consistent with
free flow within the subarachnoid space. Procedure performed by ITTI
ITTI.
FINDINGS: THORACIC AND LUMBAR MYELOGRAM FINDINGS:

Extensive fusion construct involving thoracolumbar spine. Spinal
canal appears widely patent.

CT THORACIC MYELOGRAM FINDINGS:

Extensive fusion construct extending inferiorly from T3 with rods
and pedicle screws. There are prior pedicle screws tracts at T9.
There is no left T8 pedicle screw. This is unchanged from the
[DATE] study. No evidence of hardware complication. Similar
vertebral body heights and alignment. Degenerative endplate
irregularity at thoracic levels above the fusion and at T8-T9.
Partially imaged lower cervical spine degenerative changes without
significant canal narrowing.

There is adequate opacification of the subarachnoid space. Greater
artifact is present as the construct was extended in comparison to
the prior myelogram. Disc protrusions at T6-T7 and T8-T9 are no
longer present. Minimal disc bulge with endplate osteophytes again
present at T1-T2. Minimal disc bulge at other levels such as T2-T3.
There is no new disc herniation. No significant canal narrowing at
any level. Facet spurring mildly encroaches on the neural foramina,
greatest on the left at T10-T11.

CT LUMBAR MYELOGRAM FINDINGS:

Extensive fusion construct involves all lumbar levels as well as S1
and bilateral iliac extenders. Single ventral screws are also
present at L5 and S1 with interbody fusion devices at L4-L5 and
L5-S1. No evidence of hardware complication. Posterior decompression
is present from L3-L5.

There is adequate opacification of the subarachnoid space. Conus
terminates at L1-L2. There is no new disc herniation. Small endplate
osteophytes are present. There is facet spurring. No new or
progressive foraminal narrowing.
IMPRESSION: Extensive fusion construct extending from T3 to the sacrum. No
evidence of hardware complication.

Disc protrusions at T6-T7 and T8-T9 are no longer present. No new
disc herniation or progressive stenosis at thoracic or lumbar
levels.

## 2021-07-26 MED ORDER — LIDOCAINE HCL (PF) 1 % IJ SOLN
5.0000 mL | Freq: Once | INTRAMUSCULAR | Status: AC
  Administered 2021-07-26: 5 mL
  Filled 2021-07-26: qty 5

## 2021-07-26 MED ORDER — IOHEXOL 300 MG/ML  SOLN
10.0000 mL | Freq: Once | INTRAMUSCULAR | Status: AC | PRN
  Administered 2021-07-26: 10 mL

## 2021-07-26 NOTE — Procedures (Signed)
Successful fluoroscopic guided lumbar and thoracic myelogram. No complications. EBL N/A See PACS for full report.  Hedy Jacob, PA-C 07/26/2021, 9:54 AM

## 2021-08-09 ENCOUNTER — Ambulatory Visit (HOSPITAL_BASED_OUTPATIENT_CLINIC_OR_DEPARTMENT_OTHER): Payer: Medicare Other | Admitting: Pain Medicine

## 2021-08-09 ENCOUNTER — Other Ambulatory Visit: Payer: Self-pay

## 2021-08-09 ENCOUNTER — Ambulatory Visit
Admission: RE | Admit: 2021-08-09 | Discharge: 2021-08-09 | Disposition: A | Discharge status: Home / Self Care | Payer: Medicare Other | Source: Ambulatory Visit | Attending: Pain Medicine | Admitting: Pain Medicine

## 2021-08-09 ENCOUNTER — Encounter: Payer: Self-pay | Admitting: Pain Medicine

## 2021-08-09 VITALS — BP 151/71 | HR 66 | Temp 96.3°F | Resp 15 | Ht 59.0 in | Wt 128.0 lb

## 2021-08-09 DIAGNOSIS — G8929 Other chronic pain: Secondary | ICD-10-CM

## 2021-08-09 DIAGNOSIS — M1712 Unilateral primary osteoarthritis, left knee: Secondary | ICD-10-CM

## 2021-08-09 DIAGNOSIS — G8918 Other acute postprocedural pain: Secondary | ICD-10-CM | POA: Insufficient documentation

## 2021-08-09 DIAGNOSIS — S76011D Strain of muscle, fascia and tendon of right hip, subsequent encounter: Secondary | ICD-10-CM | POA: Diagnosis not present

## 2021-08-09 DIAGNOSIS — M7122 Synovial cyst of popliteal space [Baker], left knee: Secondary | ICD-10-CM | POA: Insufficient documentation

## 2021-08-09 DIAGNOSIS — M25562 Pain in left knee: Secondary | ICD-10-CM | POA: Insufficient documentation

## 2021-08-09 DIAGNOSIS — M4327 Fusion of spine, lumbosacral region: Secondary | ICD-10-CM | POA: Diagnosis not present

## 2021-08-09 MED ORDER — MIDAZOLAM HCL 5 MG/5ML IJ SOLN: 0.5000 mg | Freq: Once | INTRAMUSCULAR | Status: DC

## 2021-08-09 MED ORDER — ROPIVACAINE HCL 2 MG/ML IJ SOLN
INTRAMUSCULAR | Status: AC
  Filled 2021-08-09: qty 20

## 2021-08-09 MED ORDER — LIDOCAINE HCL 2 % IJ SOLN
20.0000 mL | Freq: Once | INTRAMUSCULAR | Status: AC
  Administered 2021-08-09: 100 mg

## 2021-08-09 MED ORDER — HYDROCODONE-ACETAMINOPHEN 5-325 MG PO TABS: 1.0000 | ORAL_TABLET | Freq: Three times a day (TID) | ORAL | 0 refills | Status: DC | PRN

## 2021-08-09 MED ORDER — ROPIVACAINE HCL 2 MG/ML IJ SOLN
9.0000 mL | Freq: Once | INTRAMUSCULAR | Status: AC
  Administered 2021-08-09: 9 mL via PERINEURAL

## 2021-08-09 MED ORDER — METHYLPREDNISOLONE ACETATE 80 MG/ML IJ SUSP
80.0000 mg | Freq: Once | INTRAMUSCULAR | Status: AC
Start: 2021-08-09 — End: 2021-08-09
  Administered 2021-08-09: 80 mg
  Filled 2021-08-09: qty 1

## 2021-08-09 MED ORDER — LACTATED RINGERS IV SOLN: 1000.0000 mL | Freq: Once | INTRAVENOUS | Status: DC

## 2021-08-09 MED ORDER — LIDOCAINE HCL (PF) 2 % IJ SOLN
INTRAMUSCULAR | Status: AC
  Filled 2021-08-09: qty 20

## 2021-08-09 MED ORDER — PENTAFLUOROPROP-TETRAFLUOROETH EX AERO
INHALATION_SPRAY | Freq: Once | CUTANEOUS | Status: AC
  Administered 2021-08-09: 30 via TOPICAL
  Filled 2021-08-09: qty 116

## 2021-08-09 NOTE — Progress Notes (Addendum)
PROVIDER NOTE: Information contained herein reflects review and annotations entered in association with encounter. Interpretation of such information and data should be left to medically-trained personnel. Information provided to patient can be located elsewhere in the medical record under "Patient Instructions". Document created using STT-dictation technology, any transcriptional errors that may result from process are unintentional.    Patient: Maria Spencer  Service Category: Procedure  Provider: Gaspar Cola, MD  DOB: 23-Jul-1946  DOS: 08/09/2021  Location: [facility]  MRN: [id number]  Setting: Ambulatory - outpatient  Referring Provider: McLean-Scocuzza, Olivia Mackie *  Type: Established Patient  Specialty: Interventional Pain Management  PCP: McLean-Scocuzza, Nino Glow, MD   Primary Reason for Visit: Interventional Pain Management Treatment. CC: Knee Pain (Left )   Procedure:          Anesthesia, Analgesia, Anxiolysis:  Type: Therapeutic Superolateral and Inferolateral, Genicular Nerve Radiofrequency Ablation (destruction).            Region: Lateral, Anterior, and Medial aspects of the knee joint, above and below the knee joint proper. Level: Superior and inferior to the knee joint. Laterality: Left  Type: Local Anesthesia Local Anesthetic: Lidocaine 1-2% Sedation: Minimal Anxiolysis  Indication(s): Anxiety & Analgesia Route: Infiltration ([place]/IM) IV Access: Available   Position: Supine   Indications: 1. Chronic knee pain (4th area of Pain) (Posterior) (Left)   2. Osteoarthritis of knee (Left)   3. Baker's cyst of knee (Left)    Maria Spencer has been dealing with the above chronic pain for longer than three months and has either failed to respond, was unable to tolerate, or simply did not get enough benefit from other more conservative therapies including, but not limited to: 1. Over-the-counter medications 2. Anti-inflammatory medications 3. Muscle relaxants 4.  Membrane stabilizers 5. Opioids 6. Physical therapy and/or chiropractic manipulation 7. Modalities (Heat, ice, etc.) 8. Invasive techniques such as nerve blocks. Maria Spencer has attained more than 50% relief of the pain from diagnostic injection.  Pain Score: Pre-procedure: 2 /10 Post-procedure: 0-No pain/10   Pre-op H&P Assessment:  Maria Spencer is a 75 y.o. (year old), female patient, seen today for interventional treatment. She  has a past surgical history that includes Total shoulder replacement (Right, 2012); Shoulder surgery (Left, 2008, 2009); Tonsillectomy; Bone graft hip iliac crest; Spine surgery (10/23/2017); Ventral hernia repair (N/A, 05/29/2018); Insertion of mesh (N/A, 05/29/2018); Eye surgery; Cardiac catheterization; Breast lumpectomy (Right, 2009); Breast biopsy (Left, 2009); Breast biopsy (Right, 2009); Colonoscopy with propofol (N/A, 05/15/2020); Spinal fusion (07/24/2020); Joint replacement; Hernia repair; Cataract extraction, bilateral (Bilateral, 10/2019); Tubal ligation; Quadriceps tendon repair (Right, 12/05/2020); and right gluteus medius/minimus repair Dr. Roland Rack 11/2020 . Maria Spencer has a current medication list which includes the following prescription(s): atorvastatin, calcium carb-cholecalciferol, fluticasone, levothyroxine, multi-vitamins, telmisartan, vitamin b-12, lidocaine, metoprolol succinate, ofloxacin, and zolpidem. Her primarily concern today is the Knee Pain (Left )  Initial Vital Signs:  Pulse/HCG Rate: 66  Temp: (!) 96.3 F (35.7 C) Resp: 16 BP: (!) 173/75 SpO2: 98 %  BMI: Estimated body mass index is 25.85 kg/m as calculated from the following:   Height as of this encounter: '4\' 11"'$  (1.499 m).   Weight as of this encounter: 128 lb (58.1 kg).  Risk Assessment: Allergies: Reviewed. She is allergic to sulfa antibiotics, nitrofuran derivatives, and amlodipine besylate.  Allergy Precautions: None required Coagulopathies: Reviewed. None identified.  Blood-thinner  therapy: None at this time Active Infection(s): Reviewed. None identified. Maria Spencer is afebrile  Site Confirmation: Maria Spencer was asked  to confirm the procedure and laterality before marking the site Procedure checklist: Completed Consent: Before the procedure and under the influence of no sedative(s), amnesic(s), or anxiolytics, the patient was informed of the treatment options, risks and possible complications. To fulfill our ethical and legal obligations, as recommended by the American Medical Association's Code of Ethics, I have informed the patient of my clinical impression; the nature and purpose of the treatment or procedure; the risks, benefits, and possible complications of the intervention; the alternatives, including doing nothing; the risk(s) and benefit(s) of the alternative treatment(s) or procedure(s); and the risk(s) and benefit(s) of doing nothing. The patient was provided information about the general risks and possible complications associated with the procedure. These may include, but are not limited to: failure to achieve desired goals, infection, bleeding, organ or nerve damage, allergic reactions, paralysis, and death. In addition, the patient was informed of those risks and complications associated to the procedure, such as failure to decrease pain; infection; bleeding; organ or nerve damage with subsequent damage to sensory, motor, and/or autonomic systems, resulting in permanent pain, numbness, and/or weakness of one or several areas of the body; allergic reactions; (i.e.: anaphylactic reaction); and/or death. Furthermore, the patient was informed of those risks and complications associated with the medications. These include, but are not limited to: allergic reactions (i.e.: anaphylactic or anaphylactoid reaction(s)); adrenal axis suppression; blood sugar elevation that in diabetics may result in ketoacidosis or comma; water retention that in patients with history of congestive  heart failure may result in shortness of breath, pulmonary edema, and decompensation with resultant heart failure; weight gain; swelling or edema; medication-induced neural toxicity; particulate matter embolism and blood vessel occlusion with resultant organ, and/or nervous system infarction; and/or aseptic necrosis of one or more joints. Finally, the patient was informed that Medicine is not an exact science; therefore, there is also the possibility of unforeseen or unpredictable risks and/or possible complications that may result in a catastrophic outcome. The patient indicated having understood very clearly. We have given the patient no guarantees and we have made no promises. Enough time was given to the patient to ask questions, all of which were answered to the patient's satisfaction. Ms. Coffman has indicated that she wanted to continue with the procedure. Attestation: I, the ordering provider, attest that I have discussed with the patient the benefits, risks, side-effects, alternatives, likelihood of achieving goals, and potential problems during recovery for the procedure that I have provided informed consent. Date  Time: 08/09/2021 10:15 AM  Pre-Procedure Preparation:  Monitoring: As per clinic protocol. Respiration, ETCO2, SpO2, BP, heart rate and rhythm monitor placed and checked for adequate function Safety Precautions: Patient was assessed for positional comfort and pressure points before starting the procedure. Time-out: I initiated and conducted the "Time-out" before starting the procedure, as per protocol. The patient was asked to participate by confirming the accuracy of the "Time Out" information. Verification of the correct person, site, and procedure were performed and confirmed by me, the nursing staff, and the patient. "Time-out" conducted as per Joint Commission's Universal Protocol (UP.01.01.01). Time: 1107  Description of Procedure:          Target Area: For this Genicular Nerve  radiofrequency ablation (destruction), the targets are: the superolateral genicular nerve [facility]), located in the lateral distal portion of the femoral shaft as it curves to form the lateral epicondyle, in the region of the distal femoral metaphysis; and the inferior lateral genicular nerve (ILGN), located around the tibial lateral epicondyle deep to  the lateral collateral ligament, following the inferior lateral genicular artery, superior of the fibula head. Approach: Anterior, ipsilateral approach. Area Prepped: Entire knee area, from mid-thigh to mid-shin, lateral, anterior, and medial aspects. DuraPrep (Iodine Povacrylex [0.7% available iodine] and Isopropyl Alcohol, 74% w/w) Safety Precautions: Aspiration looking for blood return was conducted prior to all injections. At no point did we inject any substances, as a needle was being advanced. No attempts were made at seeking any paresthesias. Safe injection practices and needle disposal techniques used. Medications properly checked for expiration dates. SDV (single dose vial) medications used. Description of the Procedure: Protocol guidelines were followed. The patient was placed in position over the procedure table. The target area was identified and the area prepped in the usual manner. The skin and muscle were infiltrated with local anesthetic. Appropriate amount of time allowed to pass for local anesthetics to take effect. Radiofrequency needles were introduced to the target area using fluoroscopic guidance. Using the NeuroTherm NT1100 Radiofrequency Generator, sensory stimulation using 50 Hz was used to locate & identify the nerve, making sure that the needle was positioned such that there was no sensory stimulation below 0.3 V or above 0.7 V. Stimulation using 2 Hz was used to evaluate the motor component. Care was taken not to lesion any nerves that demonstrated motor stimulation of the lower extremities at an output of less than 2.5 times that of the  sensory threshold, or a maximum of 2.0 V. Once satisfactory placement of the needles was achieved, the numbing solution was slowly injected after negative aspiration. After waiting for at least 2 minutes, the ablation was performed at 80 degrees C for 60 seconds, using regular Radiofrequency settings. Once the procedure was completed, the needles were then removed and the area cleansed, making sure to leave some of the prepping solution back to take advantage of its long term bactericidal properties. Intra-operative Compliance: Compliant   Vitals:   08/09/21 1120 08/09/21 1125 08/09/21 1130 08/09/21 1135  BP: (!) 155/62 (!) 141/56 (!) 148/69 (!) 151/71  Pulse: 67 68 65 66  Resp: '14 17 16 15  '$ Temp:      TempSrc:      SpO2: 99% 100% 99% 98%  Weight:      Height:        Start Time: 1107 hrs. End Time: 1135 hrs. Materials & Medications:  Needle(s) Type: Teflon-coated, curved tip, Radiofrequency needle(s) Gauge: 22G Length: 5cm Medication(s): Please see orders for medications and dosing details.  Imaging Guidance (Non-Spinal):          Type of Imaging Technique: Fluoroscopy Guidance (Non-Spinal) Indication(s): Assistance in needle guidance and placement for procedures requiring needle placement in or near specific anatomical locations not easily accessible without such assistance. Exposure Time: Please see nurses notes. Contrast: Before injecting any contrast, we confirmed that the patient did not have an allergy to iodine, shellfish, or radiological contrast. Once satisfactory needle placement was completed at the desired level, radiological contrast was injected. Contrast injected under live fluoroscopy. No contrast complications. See chart for type and volume of contrast used. Fluoroscopic Guidance: I was personally present during the use of fluoroscopy. "Tunnel Vision Technique" used to obtain the best possible view of the target area. Parallax error corrected before commencing the  procedure. "Direction-depth-direction" technique used to introduce the needle under continuous pulsed fluoroscopy. Once target was reached, antero-posterior, oblique, and lateral fluoroscopic projection used confirm needle placement in all planes. Images permanently stored in EMR. Interpretation: I personally interpreted the imaging intraoperatively.  Adequate needle placement confirmed in multiple planes. Appropriate spread of contrast into desired area was observed. No evidence of afferent or efferent intravascular uptake. Permanent images saved into the patient's record.  Antibiotic Prophylaxis:   Anti-infectives (From admission, onward)    None      Indication(s): None identified  Post-operative Assessment:  Post-procedure Vital Signs:  Pulse/HCG Rate: 66 (nsr)  Temp: (!) 96.3 F (35.7 C) Resp: 15 BP: (!) 151/71 SpO2: 98 %  EBL: None  Complications: No immediate post-treatment complications observed by team, or reported by patient.  Note: The patient tolerated the entire procedure well. A repeat set of vitals were taken after the procedure and the patient was kept under observation following institutional policy, for this type of procedure. Post-procedural neurological assessment was performed, showing return to baseline, prior to discharge. The patient was provided with post-procedure discharge instructions, including a section on how to identify potential problems. Should any problems arise concerning this procedure, the patient was given instructions to immediately contact us, at any time, without hesitation. In any case, we plan to contact the patient by telephone for a follow-up status report regarding this interventional procedure.  Comments:  No additional relevant information.  Plan of Care  Orders:  Orders Placed This Encounter  Procedures   Radiofrequency,Genicular    Scheduling Instructions:     Side(s): Left Knee     Level(s): Superior-Lateral, Superior-Medial, and  Inferior-Medial Genicular Nerve(s)     Sedation: With Sedation.     Timeframe: Today    Order Specific Question:   Where will this procedure be performed?    Answer:   [facility]   DG PAIN [facility] C-ARM 1-60 MIN NO REPORT    Intraoperative interpretation by procedural physician at [facility].    Standing Status:   Standing    Number of Occurrences:   1    Order Specific Question:   Reason for exam:    Answer:   Assistance in needle guidance and placement for procedures requiring needle placement in or near specific anatomical locations not easily accessible without such assistance.   Informed Consent Details: Physician/Practitioner Attestation; Transcribe to consent form and obtain patient signature    Note: Always confirm laterality of pain with Ms. Chestnut, before procedure. Transcribe to consent form and obtain patient signature.    Order Specific Question:   Physician/Practitioner attestation of informed consent for procedure/surgical case    Answer:   I, the physician/practitioner, attest that I have discussed with the patient the benefits, risks, side effects, alternatives, likelihood of achieving goals and potential problems during recovery for the procedure that I have provided informed consent.    Order Specific Question:   Procedure    Answer:   Radiofrequency ablation of the genicular nerves of the knee    Order Specific Question:   Physician/Practitioner performing the procedure    Answer:   Jaelen Soth A. Dossie Arbour, MD    Order Specific Question:   Indication/Reason    Answer:   Chronic knee pain    Chronic Opioid Analgesic:  No opioid analgesics prescribed by our practice. Highest recorded MME/day: 75 mg/day MME/day: 0 mg/day   Medications ordered for procedure: Meds ordered this encounter  Medications   lidocaine (XYLOCAINE) 2 % (with pres) injection 400 mg   pentafluoroprop-tetrafluoroeth (GEBAUERS) aerosol   DISCONTD: lactated ringers infusion 1,000 mL    DISCONTD: midazolam (VERSED) 5 MG/5ML injection 0.5-2 mg    Make sure Flumazenil is available in the pyxis when  using this medication. If oversedation occurs, administer 0.2 mg IV over 15 sec. If after 45 sec no response, administer 0.2 mg again over 1 min; may repeat at 1 min intervals; not to exceed 4 doses (1 mg)   methylPREDNISolone acetate (DEPO-MEDROL) injection 80 mg   ropivacaine (PF) 2 mg/mL (0.2%) (NAROPIN) injection 9 mL   DISCONTD: HYDROcodone-acetaminophen (NORCO/VICODIN) 5-325 MG tablet    Sig: Take 1 tablet by mouth every 8 (eight) hours as needed for up to 7 days for severe pain. Must last 7 days.    Dispense:  21 tablet    Refill:  0    For acute post-operative pain. Not to be refilled. Must last 7 days.   DISCONTD: HYDROcodone-acetaminophen (NORCO/VICODIN) 5-325 MG tablet    Sig: Take 1 tablet by mouth every 8 (eight) hours as needed for up to 7 days for severe pain. Must last for 7 days.    Dispense:  21 tablet    Refill:  0    For acute post-operative pain. Not to be refilled. Must last 7 days.   DISCONTD: HYDROcodone-acetaminophen (NORCO/VICODIN) 5-325 MG tablet    Sig: Take 1 tablet by mouth every 8 (eight) hours as needed for up to 7 days for severe pain. Must last for 7 days.    Dispense:  21 tablet    Refill:  0    For acute post-operative pain. Not to be refilled. Must last 7 days.   DISCONTD: HYDROcodone-acetaminophen (NORCO/VICODIN) 5-325 MG tablet    Sig: Take 1 tablet by mouth every 8 (eight) hours as needed for up to 7 days for severe pain. Must last 7 days.    Dispense:  21 tablet    Refill:  0    For acute post-operative pain. Not to be refilled. Must last 7 days.    Medications administered: We administered lidocaine, pentafluoroprop-tetrafluoroeth, methylPREDNISolone acetate, and ropivacaine (PF) 2 mg/mL (0.2%).  See the medical record for exact dosing, route, and time of administration.  Follow-up plan:   Return in about 6 weeks (around  09/20/2021) for Proc-day (T,Th), (F2F), (PPE).       Interventional Therapies  Risk  Complexity Considerations:   Estimated body mass index is 25.85 kg/m as calculated from the following:   Height as of this encounter: '4\' 11"'$  (1.499 m).   Weight as of this encounter: 128 lb (58.1 kg). WNL   Planned  Pending:   Therapeutic left genicular nerve RFA #1    Under consideration:   Diagnostic right T9-10 thoracic ESI #1  Diagnostic bilateral T9, T10, T11, and T12 MMB thoracic facet RFA (DIFICULT CANDIDATE DUE TO HARDWARE)    Completed:   Diagnostic left genicular NB x1  Diagnostic/therapeutic bilateral T9-12 thoracic facet MBB x2  Diagnostic/Therapeutic right trapezoid bursa injection x1    Therapeutic  Palliative (PRN) options:   Therapeutic/palliative bilateral T9-12 thoracic facet MBB #3  Therapeutic/palliative right trapezoid bursa injection #2     Recent Visits Date Type Provider Dept  08/09/21 Procedure visit Milinda Pointer, MD [facility]  07/17/21 Office Visit Milinda Pointer, MD [facility]  Showing recent visits within past 90 days and meeting all other requirements Today's Visits Date Type Provider Dept  10/11/21 Office Visit Milinda Pointer, MD [facility]  Showing today's visits and meeting all other requirements Future Appointments Date Type Provider Dept  10/17/21 Appointment Milinda Pointer, MD [facility]  Showing future appointments within next 90 days and meeting all other requirements Disposition:  Discharge home  Discharge (Date  Time): 08/09/2021;   hrs.   Primary Care Physician: McLean-Scocuzza, Nino Glow, MD Location: [facility] Note by: Gaspar Cola, MD Date: 08/09/2021; Time: 4:27 PM  Disclaimer:  Medicine is not an Chief Strategy Officer. The only guarantee in medicine is that nothing is guaranteed. It is important to note that the decision to proceed with this  intervention was based on the information collected from the patient. The Data and conclusions were drawn from the patient's questionnaire, the interview, and the physical examination. Because the information was provided in large part by the patient, it cannot be guaranteed that it has not been purposely or unconsciously manipulated. Every effort has been made to obtain as much relevant data as possible for this evaluation. It is important to note that the conclusions that lead to this procedure are derived in large part from the available data. Always take into account that the treatment will also be dependent on availability of resources and existing treatment guidelines, considered by other Pain Management Practitioners as being common knowledge and practice, at the time of the intervention. For Medico-Legal purposes, it is also important to point out that variation in procedural techniques and pharmacological choices are the acceptable norm. The indications, contraindications, technique, and results of the above procedure should only be interpreted and judged by a Board-Certified Interventional Pain Specialist with extensive familiarity and expertise in the same exact procedure and technique.

## 2021-08-09 NOTE — Progress Notes (Signed)
Safety precautions to be maintained throughout the outpatient stay will include: orient to surroundings, keep bed in low position, maintain call bell within reach at all times, provide assistance with transfer out of bed and ambulation.  

## 2021-08-09 NOTE — Patient Instructions (Signed)

## 2021-08-10 ENCOUNTER — Telehealth: Payer: Self-pay

## 2021-08-10 NOTE — Telephone Encounter (Signed)
Post procedure phone call.  Patient states she is doing great 

## 2021-08-15 ENCOUNTER — Other Ambulatory Visit: Payer: Self-pay

## 2021-08-15 ENCOUNTER — Encounter: Payer: Self-pay | Admitting: Family Medicine

## 2021-08-15 ENCOUNTER — Telehealth (INDEPENDENT_AMBULATORY_CARE_PROVIDER_SITE_OTHER): Payer: Medicare Other | Admitting: Family Medicine

## 2021-08-15 VITALS — Temp 98.6°F | Ht 59.0 in | Wt 129.1 lb

## 2021-08-15 DIAGNOSIS — U071 COVID-19: Secondary | ICD-10-CM

## 2021-08-15 MED ORDER — MOLNUPIRAVIR EUA 200MG CAPSULE: 4.0000 | ORAL_CAPSULE | Freq: Two times a day (BID) | ORAL | 0 refills | Status: AC

## 2021-08-15 NOTE — Progress Notes (Signed)
      Kijana Estock T. Divonte Senger, MD Primary Care and Sports Medicine [facility] [place], [address] Phone: [phone]  FAX: [place] - 75 y.o. female  MRN [id number]  Date of Birth: August 22, 1946  Visit Date: 08/15/2021  PCP: McLean-Scocuzza, Nino Glow, MD  Referred by: Orland Mustard *  Virtual Visit via Video Note:  I connected with  [name] on 08/15/2021 12:00 PM EDT by a video enabled telemedicine application and verified that I am speaking with the correct person using two identifiers.   Location patient: home computer, tablet, or smartphone Location provider: work or home office Consent: Verbal consent directly obtained from [employer]. Persons participating in the virtual visit: patient, provider  I discussed the limitations of evaluation and management by telemedicine and the availability of in person appointments. The patient expressed understanding and agreed to proceed.  Chief Complaint  Patient presents with   Covid Positive    Positive Covid Test Today-Symptoms started Monday night   Headache   Sore Throat   Chills    Body Aches    History of Present Illness:  This is a confirmed COVID from home test.  Primary symptoms are severe sore throat as well as some coughing.  She does have some pressure in her head and she has some diffuse body aches and chills.  She denies a fever.  She does not have any nausea, vomiting, diarrhea. There are no neurological changes.  She also does have a headache.  She is eating and drinking and tolerating p.o.  Really tired and her throat hurts really bad Pressure in her head Chills Body aches  Started on Monday Repeated test this AM    Review of Systems as above: See pertinent positives and pertinent negatives per HPI No acute distress verbally   Observations/Objective/Exam:  An attempt was made to discern vital signs over the phone  and per patient if applicable and possible.   General:    Alert, Oriented, appears well and in no acute distress  Pulmonary:     On inspection no signs of respiratory distress.  Psych / Neurological:     Pleasant and cooperative.  Assessment and Plan:    ICD-10-CM   1. COVID-19  U07.1      Multiple risk factors.  Age 35.  In this case I think treating with antivirals is the most appropriate course of action.  Continue supportive care.  I discussed the assessment and treatment plan with the patient. The patient was provided an opportunity to ask questions and all were answered. The patient agreed with the plan and demonstrated an understanding of the instructions.   The patient was advised to call back or seek an in-person evaluation if the symptoms worsen or if the condition fails to improve as anticipated.  Follow-up: prn unless noted otherwise below No follow-ups on file.  Meds ordered this encounter  Medications   molnupiravir EUA (LAGEVRIO) 200 mg CAPS capsule    Sig: Take 4 capsules (800 mg total) by mouth 2 (two) times daily for 5 days.    Dispense:  40 capsule    Refill:  0    No orders of the defined types were placed in this encounter.   Signed,  Maud Deed. Kahlie Deutscher, MD

## 2021-08-21 DIAGNOSIS — U071 COVID-19: Secondary | ICD-10-CM | POA: Diagnosis not present

## 2021-08-27 DIAGNOSIS — R200 Anesthesia of skin: Secondary | ICD-10-CM | POA: Diagnosis not present

## 2021-08-28 DIAGNOSIS — R200 Anesthesia of skin: Secondary | ICD-10-CM | POA: Diagnosis not present

## 2021-08-28 DIAGNOSIS — R202 Paresthesia of skin: Secondary | ICD-10-CM | POA: Diagnosis not present

## 2021-08-31 ENCOUNTER — Other Ambulatory Visit (HOSPITAL_BASED_OUTPATIENT_CLINIC_OR_DEPARTMENT_OTHER): Payer: Self-pay | Admitting: Surgery

## 2021-08-31 ENCOUNTER — Other Ambulatory Visit: Payer: Self-pay | Admitting: Surgery

## 2021-08-31 DIAGNOSIS — M67952 Unspecified disorder of synovium and tendon, left thigh: Secondary | ICD-10-CM

## 2021-08-31 DIAGNOSIS — S76011D Strain of muscle, fascia and tendon of right hip, subsequent encounter: Secondary | ICD-10-CM | POA: Diagnosis not present

## 2021-08-31 DIAGNOSIS — S76011S Strain of muscle, fascia and tendon of right hip, sequela: Secondary | ICD-10-CM

## 2021-08-31 DIAGNOSIS — M4327 Fusion of spine, lumbosacral region: Secondary | ICD-10-CM | POA: Diagnosis not present

## 2021-09-02 ENCOUNTER — Other Ambulatory Visit: Payer: Self-pay | Admitting: Internal Medicine

## 2021-09-02 DIAGNOSIS — F4321 Adjustment disorder with depressed mood: Secondary | ICD-10-CM

## 2021-09-02 DIAGNOSIS — G4700 Insomnia, unspecified: Secondary | ICD-10-CM

## 2021-09-06 ENCOUNTER — Ambulatory Visit: Payer: Medicare Other

## 2021-09-09 ENCOUNTER — Other Ambulatory Visit: Payer: Self-pay

## 2021-09-09 ENCOUNTER — Ambulatory Visit
Admission: RE | Admit: 2021-09-09 | Discharge: 2021-09-09 | Disposition: A | Discharge status: Home / Self Care | Payer: Medicare Other | Source: Ambulatory Visit | Attending: Surgery | Admitting: Surgery

## 2021-09-09 DIAGNOSIS — M67952 Unspecified disorder of synovium and tendon, left thigh: Secondary | ICD-10-CM | POA: Diagnosis not present

## 2021-09-09 DIAGNOSIS — S76311A Strain of muscle, fascia and tendon of the posterior muscle group at thigh level, right thigh, initial encounter: Secondary | ICD-10-CM | POA: Diagnosis not present

## 2021-09-09 DIAGNOSIS — S76011S Strain of muscle, fascia and tendon of right hip, sequela: Secondary | ICD-10-CM | POA: Insufficient documentation

## 2021-09-09 DIAGNOSIS — R102 Pelvic and perineal pain: Secondary | ICD-10-CM | POA: Diagnosis not present

## 2021-09-09 DIAGNOSIS — M160 Bilateral primary osteoarthritis of hip: Secondary | ICD-10-CM | POA: Diagnosis not present

## 2021-09-09 DIAGNOSIS — Z981 Arthrodesis status: Secondary | ICD-10-CM | POA: Diagnosis not present

## 2021-09-09 IMAGING — MR MR PELVIS W/O CM
4 of 5 series · 26 of 48 positions shown · non-contrast
Comparison: Multiple exams, including CT pelvis [DATE]

CLINICAL DATA: Bilateral gluteal pain. Fall with popping along the
right gluteal region 1 month ago with leg burning sensation. Prior
right quadriceps tendon repair and right gluteus medius/minimus
repairs in the past.

EXAM:
MRI PELVIS WITHOUT CONTRAST
TECHNIQUE: Multiplanar multisequence MR imaging of the pelvis was performed. No
intravenous contrast was administered.

[Series 9: STIR · coronal · 4.0mm · 1.19mm/px · 8 of 40 slices shown (1 of 2)]
[im 1/40]
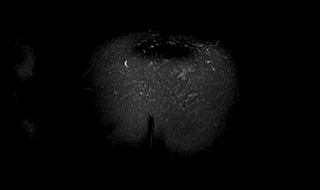
[im 6/40]
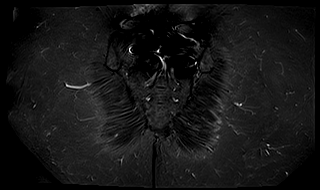
[im 12/40]
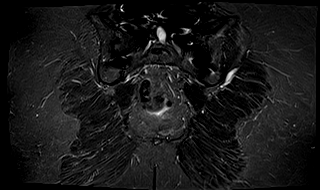
[im 17/40]
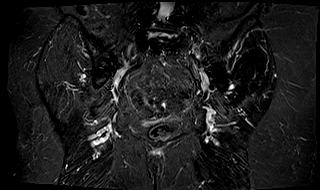
[im 23/40]
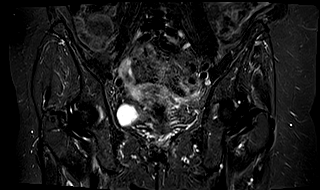
[im 28/40]
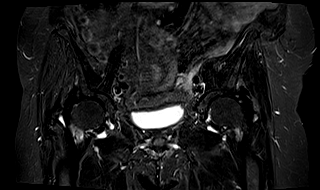
[im 34/40]
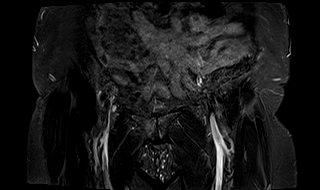
[im 40/40]
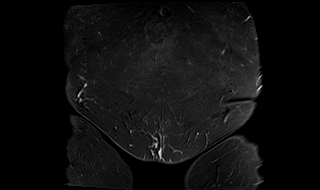

[Series 11: T1 · axial · 4.0mm · 0.74mm/px · z∈[-94,+141]mm · 9 of 48 slices shown (1 of 2)]
[im 1/48]
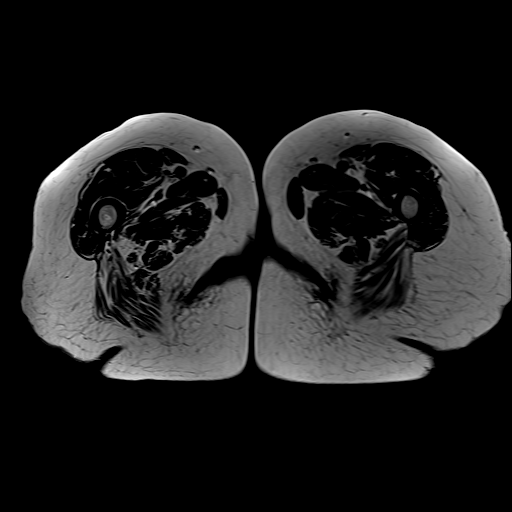
[im 6/48]
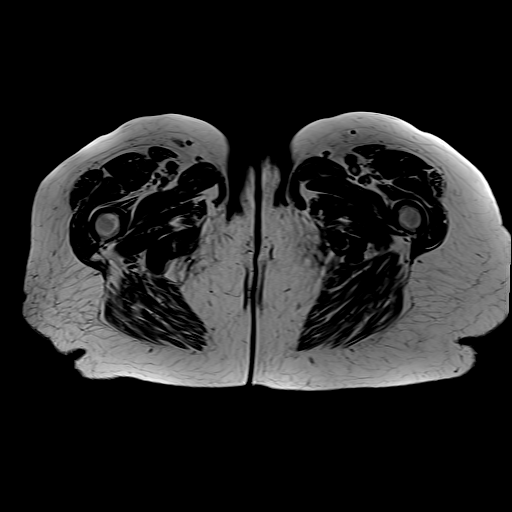
[im 12/48]
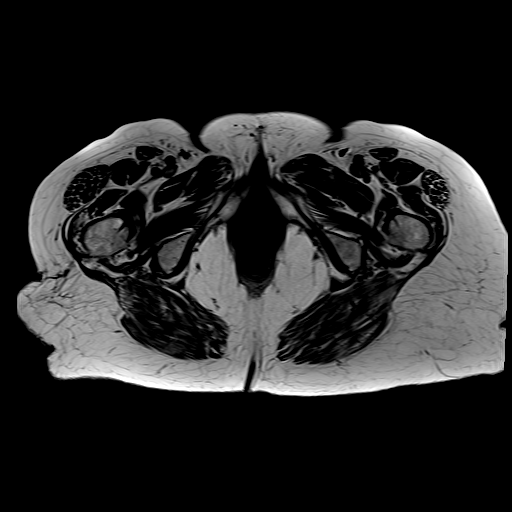
[im 18/48]
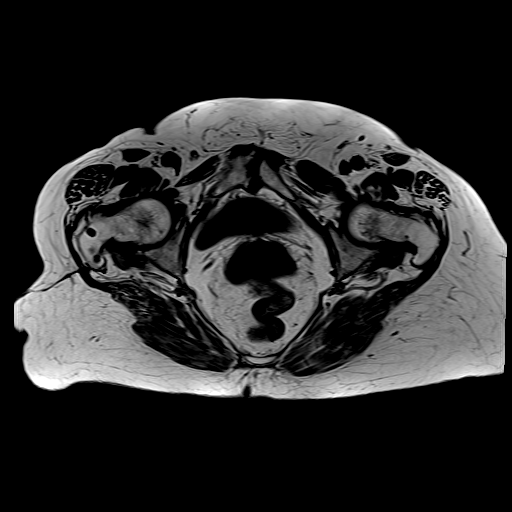
[im 24/48]
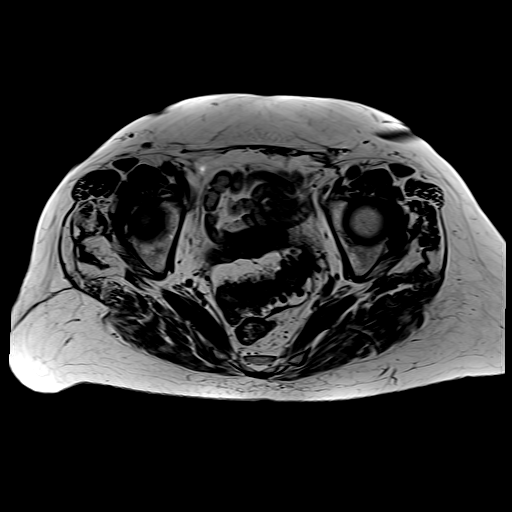
[im 30/48]
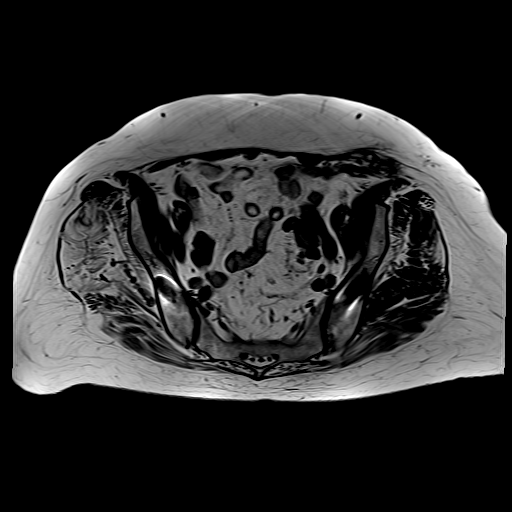
[im 36/48]
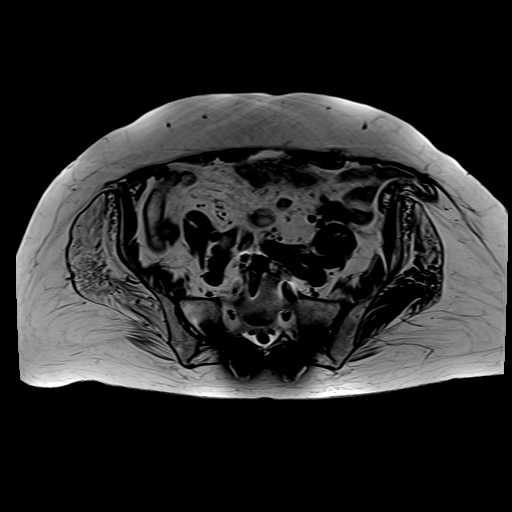
[im 42/48]
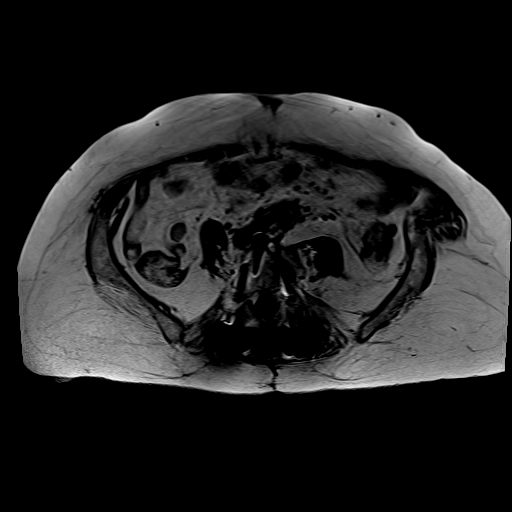
[im 48/48]
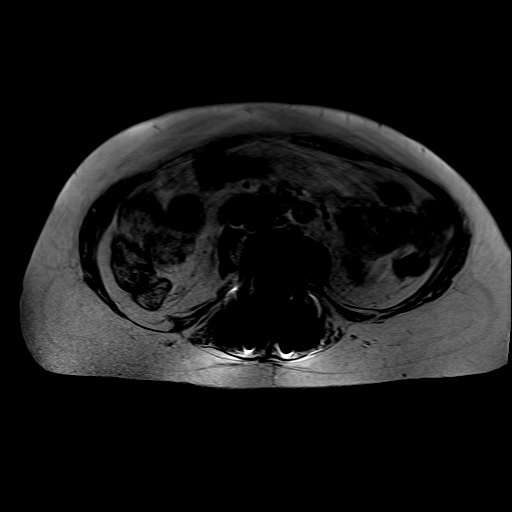

[Series 12: STIR · axial · 4.0mm · 0.74mm/px · z∈[-94,+111]mm · 6 of 48 slices shown (2 of 2)]
[im 1/48]
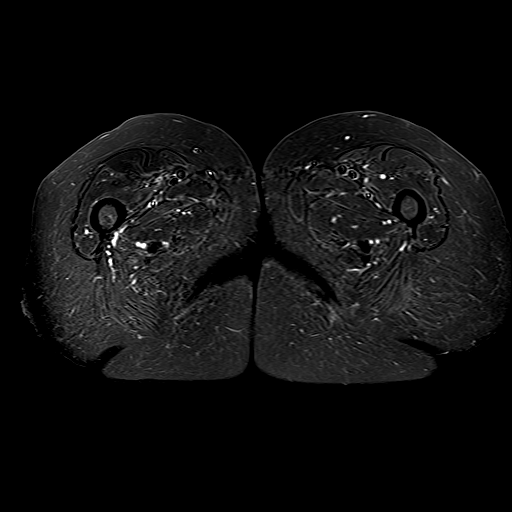
[im 6/48]
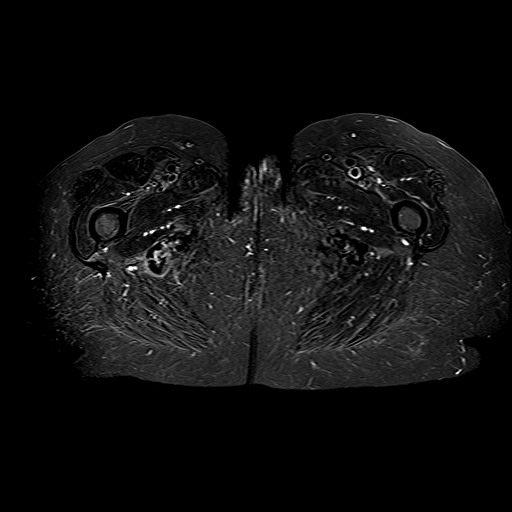
[im 12/48]
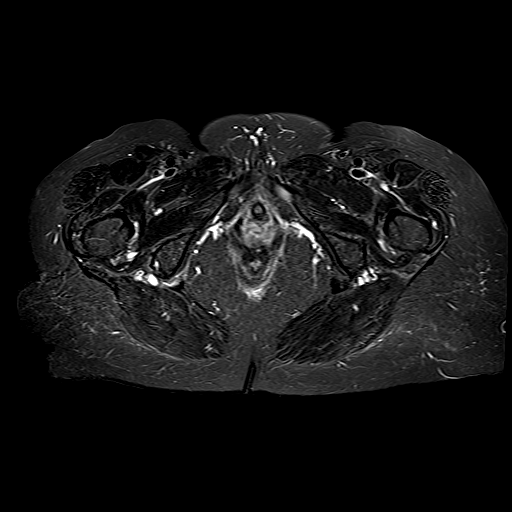
[im 18/48]
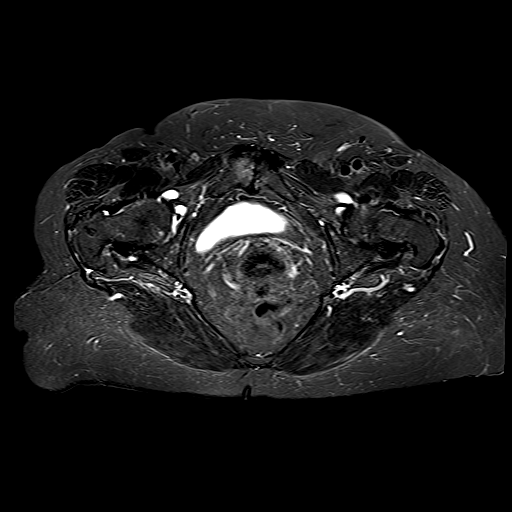
[im 24/48]
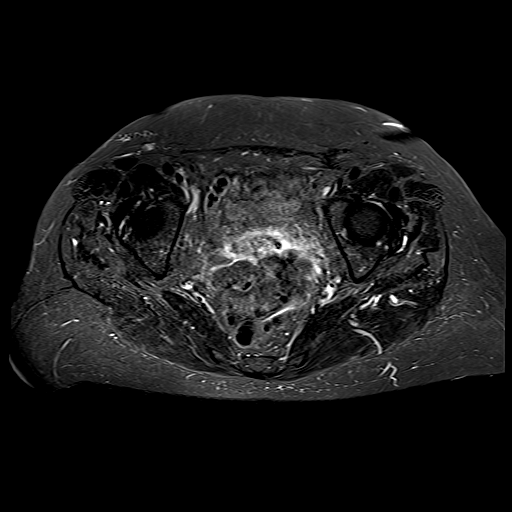
[im 42/48]
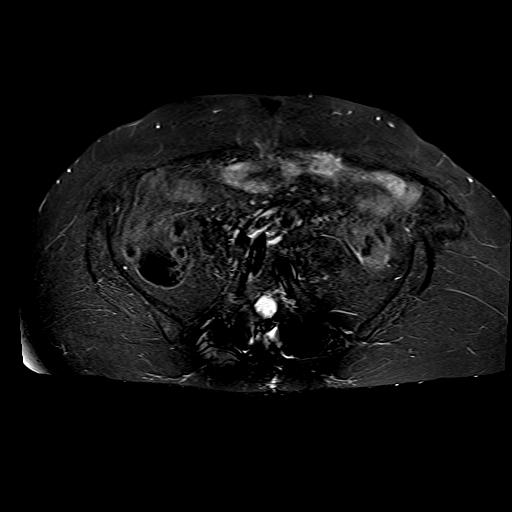

[Series 13: T1 · coronal · 4.0mm · 1.19mm/px · 3 of 40 slices shown (2 of 2)]
[im 6/40]
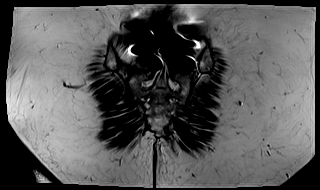
[im 23/40]
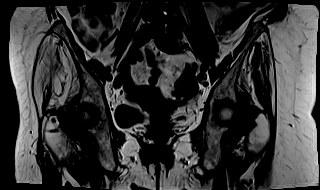
[im 34/40]
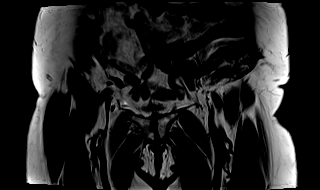

[26 of 48 positions shown; findings below may reference images not displayed]

FINDINGS: Urinary Tract:  Unremarkable

Bowel: Sigmoid colon diverticulosis with some faint hazy adjacent
stranding/edema raising the possibility of low-grade diverticulitis
for example on image 13 series 9. No appreciable abscess.

Vascular/Lymphatic: No pathologic pelvic adenopathy or specific
vascular abnormality identified.

Reproductive:  No abnormality identified.

Other:  No supplemental non-categorized findings.

Musculoskeletal: Anchor tracks along the right greater trochanter
from prior gluteus minimus and gluteus medius reattachment. Both
gluteus minimus and gluteus medius muscles are substantially
atrophic as shown on the CT from [DATE], and there is also
chronic left gluteus minimus atrophy. Mild irregularity of the
distal gluteus minimus and medius tendons without frank detachment.
Trace adjacent trochanteric bursitis on the right. No obvious acute
injury in this vicinity.

Tendinopathy and partial tearing of the right proximal hamstring
tendon on images 20-22 of series 9. Mild tendinopathy of the left
proximal hamstring tendon.

Moderate spurring of both acetabula and both femoral heads, with
upper normal amount of fluid in both hip joints. Unremarkable hip
adductor musculature.

Arthropathy at the pubis with associated spurring and low-grade
edema. No clear tear of the adductor aponeurosis.

Mild laxity of the left anterior abdominal wall laterally

Lower lumbar, sacral, and iliac bone fixation hardware noted. The
iliac screws traverse the SI joints.
IMPRESSION: 1. Chronic atrophy of the bilateral gluteus minimus and of the right
gluteus medius muscles. Postoperative findings along the right
greater trochanter from prior gluteus medius and gluteus minimus
tendon reattachment. The distal tendons appear somewhat irregular
but not ruptured. There is trace right trochanteric bursitis.
2. Sigmoid colon diverticulosis with faint adjacent edema/stranding
raise the possibility of low-grade active diverticulitis.
3. Tendinopathy and partial tearing of the right proximal hamstring
tendon, likely chronic. Mild tendinopathy of the left proximal
hamstring tendon.
4. Moderate bilateral degenerative hip arthropathy. Degenerative
arthropathy of the pubis.
5. Mild laxity of the left anterior abdominal wall laterally along
the upper pelvis.
6. Multilevel lumbar fusion with lumbosacral and iliac bone
fixation.

## 2021-09-10 DIAGNOSIS — Z23 Encounter for immunization: Secondary | ICD-10-CM | POA: Diagnosis not present

## 2021-09-13 ENCOUNTER — Ambulatory Visit: Payer: Medicare Other

## 2021-09-20 ENCOUNTER — Ambulatory Visit: Payer: Medicare Other | Admitting: Pain Medicine

## 2021-09-24 DIAGNOSIS — S76311D Strain of muscle, fascia and tendon of the posterior muscle group at thigh level, right thigh, subsequent encounter: Secondary | ICD-10-CM | POA: Diagnosis not present

## 2021-09-24 DIAGNOSIS — S76011D Strain of muscle, fascia and tendon of right hip, subsequent encounter: Secondary | ICD-10-CM | POA: Diagnosis not present

## 2021-09-25 ENCOUNTER — Ambulatory Visit (INDEPENDENT_AMBULATORY_CARE_PROVIDER_SITE_OTHER): Payer: Medicare Other | Admitting: Internal Medicine

## 2021-09-25 ENCOUNTER — Other Ambulatory Visit: Payer: Self-pay

## 2021-09-25 ENCOUNTER — Encounter: Payer: Self-pay | Admitting: Internal Medicine

## 2021-09-25 VITALS — BP 128/84 | HR 88 | Temp 98.3°F | Ht 59.0 in | Wt 130.4 lb

## 2021-09-25 DIAGNOSIS — S76011D Strain of muscle, fascia and tendon of right hip, subsequent encounter: Secondary | ICD-10-CM | POA: Diagnosis not present

## 2021-09-25 DIAGNOSIS — B3731 Acute candidiasis of vulva and vagina: Secondary | ICD-10-CM

## 2021-09-25 DIAGNOSIS — H6691 Otitis media, unspecified, right ear: Secondary | ICD-10-CM

## 2021-09-25 DIAGNOSIS — E039 Hypothyroidism, unspecified: Secondary | ICD-10-CM | POA: Diagnosis not present

## 2021-09-25 DIAGNOSIS — B370 Candidal stomatitis: Secondary | ICD-10-CM

## 2021-09-25 DIAGNOSIS — R7303 Prediabetes: Secondary | ICD-10-CM

## 2021-09-25 DIAGNOSIS — I10 Essential (primary) hypertension: Secondary | ICD-10-CM

## 2021-09-25 DIAGNOSIS — J0110 Acute frontal sinusitis, unspecified: Secondary | ICD-10-CM | POA: Diagnosis not present

## 2021-09-25 DIAGNOSIS — E559 Vitamin D deficiency, unspecified: Secondary | ICD-10-CM | POA: Diagnosis not present

## 2021-09-25 DIAGNOSIS — S76311A Strain of muscle, fascia and tendon of the posterior muscle group at thigh level, right thigh, initial encounter: Secondary | ICD-10-CM | POA: Diagnosis not present

## 2021-09-25 DIAGNOSIS — M810 Age-related osteoporosis without current pathological fracture: Secondary | ICD-10-CM | POA: Diagnosis not present

## 2021-09-25 MED ORDER — FLUCONAZOLE 150 MG PO TABS: 150.0000 mg | ORAL_TABLET | Freq: Once | ORAL | 0 refills | Status: AC

## 2021-09-25 MED ORDER — AZITHROMYCIN 250 MG PO TABS: ORAL_TABLET | ORAL | 0 refills | Status: AC

## 2021-09-25 MED ORDER — ZOLPIDEM TARTRATE 5 MG PO TABS: 2.5000 mg | ORAL_TABLET | Freq: Every evening | ORAL | 0 refills | Status: DC | PRN

## 2021-09-25 MED ORDER — NYSTATIN 100000 UNIT/ML MT SUSP
5.0000 mL | Freq: Four times a day (QID) | OROMUCOSAL | 0 refills | Status: DC
Start: 2021-09-25 — End: 2021-10-02

## 2021-09-25 MED ORDER — CIPROFLOXACIN-DEXAMETHASONE 0.3-0.1 % OT SUSP: 4.0000 [drp] | Freq: Two times a day (BID) | OTIC | 0 refills | Status: DC

## 2021-09-25 NOTE — Patient Instructions (Addendum)
Consider increase toprol 50 mg to 100 mg daily   Dr. Zachery Dakins 336 [phone] phone call for fax # [address]  [place] [address]   Pick up zpack antibiotic and Ciprodex ear drops for ears  If not needed do not pick up yeast pill diflucan or nystatin for thrush in the mouth   Zoster Vaccine, Recombinant injection What is this medication? ZOSTER VACCINE (ZOS ter vak SEEN) is a vaccine used to reduce the risk of getting shingles. This vaccine is not used to treat shingles or nerve pain from shingles. This medicine may be used for other purposes; ask your health care provider or pharmacist if you have questions. COMMON BRAND NAME(S): [facility] What should I tell my care team before I take this medication? They need to know if you have any of these conditions: cancer immune system problems an unusual or allergic reaction to Zoster vaccine, other medications, foods, dyes, or preservatives pregnant or trying to get pregnant breast-feeding How should I use this medication? This vaccine is injected into a muscle. It is given by a health care provider. A copy of Vaccine Information Statements will be given before each vaccination. Be sure to read this information carefully each time. This sheet may change often. Talk to your health care provider about the use of this vaccine in children. This vaccine is not approved for use in children. Overdosage: If you think you have taken too much of this medicine contact a poison control center or emergency room at once. NOTE: This medicine is only for you. Do not share this medicine with others. What if I miss a dose? Keep appointments for follow-up (booster) doses. It is important not to miss your dose. Call your health care provider if you are unable to keep an appointment. What may interact with this medication? medicines that suppress your immune system medicines to treat cancer steroid medicines like prednisone or cortisone This list may  not describe all possible interactions. Give your health care provider a list of all the medicines, herbs, non-prescription drugs, or dietary supplements you use. Also tell them if you smoke, drink alcohol, or use illegal drugs. Some items may interact with your medicine. What should I watch for while using this medication? Visit your health care provider regularly. This vaccine, like all vaccines, may not fully protect everyone. What side effects may I notice from receiving this medication? Side effects that you should report to your doctor or health care professional as soon as possible: allergic reactions (skin rash, itching or hives; swelling of the face, lips, or tongue) trouble breathing Side effects that usually do not require medical attention (report these to your doctor or health care professional if they continue or are bothersome): chills headache fever nausea pain, redness, or irritation at site where injected tiredness vomiting This list may not describe all possible side effects. Call your doctor for medical advice about side effects. You may report side effects to FDA at 1-800-FDA-1088. Where should I keep my medication? This vaccine is only given by a health care provider. It will not be stored at home. NOTE: This sheet is a summary. It may not cover all possible information. If you have questions about this medicine, talk to your doctor, pharmacist, or health care provider.  2022 [employer]vier/[employer] Standard (2019-12-17 16:23:07)  Hamstring Strain A hamstring strain happens when the muscles in the back of the thighs (hamstring muscles) are overstretched or torn. The hamstring muscles are used in straightening the hips, bending  the knees, and pulling back the legs. This injury is often called a pulled hamstring muscle. The tissue that connects the muscle to a bone (tendon) may also be affected. The severity of a hamstring strain may be rated in degrees or grades. First-degree (or  grade 1) strains have the least amount of muscle tearing and pain. Second-degree and third-degree (grade 2 and 3) strains have increasingly more tearing and pain. What are the causes? This condition is caused by a sudden, violent force being placed on the hamstring muscles, stretching them too far. This often happens during activities that involve running, jumping, kicking, or weight lifting. What increases the risk? Hamstring strains are especially common in athletes. The following factors may also make you more likely to develop this condition: Having low strength, endurance, or flexibility of the hamstring muscles. Doing high-impact physical activity or sports. Having poor physical fitness. Having a previous leg injury. Having tired (fatigued) muscles. What are the signs or symptoms? Symptoms of this condition include: Pain in the back of the thigh. Swelling. Bruising. Muscle spasms. Trouble moving the affected muscle because of pain. For severe strains, you may feel popping or snapping in the back of your thigh when the injury occurs. How is this diagnosed? This condition is diagnosed based on your symptoms, your medical history, and a physical exam. How is this treated? Treatment for this condition usually involves: Protecting, resting, icing, applying compression, and elevating the injured area (PRICE therapy). Medicines. Your health care provider may recommend medicines to help reduce pain or inflammation. Doing exercises to regain strength and flexibility in the muscles. Your health care provider will tell you when it is okay to begin exercising. Follow these instructions at home: PRICE therapy Use PRICE therapy to promote muscle healing during the first 2-3 days after your injury, or as told by your health care provider. Protect the muscle from being injured again. Rest your injury. This usually involves limiting your normal activities and not using the injured hamstring muscle.  Talk with your health care provider about how you should limit your activities. Apply ice to the injured area: Put ice in a plastic bag. Place a towel between your skin and the bag. Leave the ice on for 20 minutes, 2-3 times a day. After the third day, switch to applying heat as told. Put pressure (compression) on your injured hamstring by wrapping it with an elastic bandage. Be careful not to wrap it too tightly. That may interfere with blood circulation or may increase swelling. Raise (elevate) your injured hamstring above the level of your heart as often as possible. When you are lying down, you can do this by putting a pillow under your thigh.  Activity Begin exercising or stretching only as told by your health care provider. Do not return to full activity level until your health care provider approves. To help prevent muscle strains in the future, always warm up before exercising and stretch afterward. General instructions Take over-the-counter and prescription medicines only as told by your health care provider. If directed, apply heat to the affected area as often as told by your health care provider. Use the heat source that your health care provider recommends, such as a moist heat pack or a heating pad. Place a towel between your skin and the heat source. Leave the heat on for 20-30 minutes. Remove the heat if your skin turns bright red. This is especially important if you are unable to feel pain, heat, or cold. You may have  a greater risk of getting burned. Keep all follow-up visits as told by your health care provider. This is important. Contact a health care provider if you have: Increasing pain or swelling in the injured area. Numbness, tingling, or a significant loss of strength in the injured area. Get help right away if: Your foot or your toes become cold or turn blue. Summary A hamstring strain happens when the muscles in the back of the thighs (hamstring muscles) are  overstretched or torn. This injury can be caused by a sudden, violent force being placed on the hamstring muscles, causing them to stretch too far. Symptoms include pain, swelling, and muscle spasms in the injured area. Treatment includes what is called PRICE therapy: protecting, resting, icing, applying compression, and elevating the injured area. This information is not intended to replace advice given to you by your health care provider. Make sure you discuss any questions you have with your health care provider. Document Revised: 07/14/2020 Document Reviewed: 07/14/2020 Elsevier Patient Education  2022 [employer] Ask your health care provider which exercises are safe for you. Do exercises exactly as told by your health care provider and adjust them as directed. It is normal to feel mild stretching, pulling, tightness, or discomfort as you do these exercises. Stop right away if you feel sudden pain or your pain gets worse. Do not begin these exercises until told by your health care provider. Stretching and range-of-motion exercises These exercises warm up your muscles and joints and improve the movement and flexibility of your thighs. These exercises also help to relieve pain, numbness, and tingling. Talk to your health care provider about these restrictions. Knee extension, seated  Sit with your left / right heel propped on a chair, a coffee table, or a footstool. Do not have anything under your knee to support it. Allow your leg muscles to relax, letting gravity straighten out your knee (extension). You should feel a stretch behind your left / right knee. If told by your health care provider, deepen the stretch by placing a __________ weight on your thigh, just above your kneecap. Hold this position for __________ seconds. Repeat __________ times. Complete this exercise __________ times a day. Seated stretch This exercise is sometimes called hamstrings and  adductors stretch. Sit on the floor with your legs stretched wide. Keep your knees straight during this exercise. Keeping your head and back in a straight line, bend at your waist to reach for your left foot (position A). You should feel a stretch in your right inner thigh (adductors). Hold this position for __________ seconds. Then slowly return to the upright position. Keeping your head and back in a straight line, bend at your waist to reach forward (position B). You should feel a stretch behind both of your thighs or knees (hamstrings). Hold this position for __________ seconds. Then slowly return to the upright position. Keeping your head and back in a straight line, bend at your waist to reach for your right foot (position C). You should feel a stretch in your left inner thigh (adductors). Hold this position for __________ seconds. Then slowly return to the upright position. Repeat __________ times. Complete this exercise __________ times a day. Hamstrings stretch, supine  Lie on your back (supine position). Loop a belt or towel over the ball of your left / right foot. The ball of your foot is on the walking surface, right under your toes. Straighten your left / right knee and slowly pull on  the belt or towel to raise your leg. Do not let your left / right knee bend while you do this. Keep your other leg flat on the floor. Raise the left / right leg until you feel a gentle stretch behind your left / right knee or thigh (hamstrings). Hold this position for __________ seconds. Slowly return your leg to the starting position. Repeat __________ times. Complete this exercise __________ times a day. Strengthening exercises These exercises build strength and endurance in your thighs. Endurance is the ability to use your muscles for a long time, even after they get tired. Straight leg raises, prone This exercise strengthens the muscles that move the hips (hip extensors). Lie on your abdomen on  a firm surface (prone position). Tense the muscles in your buttocks and lift your left / right leg about 4 inches (10 cm). Keep your knee straight as you lift your leg. If you cannot lift your leg that high without arching your back, place a pillow under your hips. Hold the position for __________ seconds. Slowly lower your leg to the starting position. Allow your muscles to relax completely before you start the next repetition. Repeat __________ times. Complete this exercise __________ times a day. Bridge This exercise strengthens the muscles in your buttocks and the back of your thighs (hip extensors). Lie on your back on a firm surface with your knees bent and your feet flat on the floor. Tighten your buttocks muscles and lift your bottom off the floor until the trunk of your body is level with your thighs. You should feel the muscles working in your buttocks and the back of your thighs. Do not arch your back. Hold this position for __________ seconds. Slowly lower your hips to the starting position. Let your buttocks muscles relax completely between repetitions. If told by your health care provider, keep your bottom lifted off the floor while you slowly walk your feet away from you as far as you can control. Hold for __________ seconds, then slowly walk your feet back toward you. Repeat __________ times. Complete this exercise __________ times a day. Lateral walking with band This is an exercise in which you walk sideways (lateral), with tension provided by an exercise band. The exercise strengthens the muscles in your hip (hip abductors). Stand in a long hallway. Wrap a loop of exercise band around your legs, just above your knees. Bend your knees gently and drop your hips down and back so your weight is over your heels. Step to the side to move down the length of the hallway, keeping your toes pointed ahead of you and keeping tension in the band. Repeat, leading with your other  leg. Repeat __________ times. Complete this exercise __________ times a day. Single leg stand with reaching This exercise is also called eccentric hamstring stretch. Stand on your left / right foot. Keep your big toe down on the floor and try to keep your arch lifted. Slowly reach down toward the floor as far as you can while keeping your balance. Lowering your thigh under tension is called eccentric stretching. Hold this position for __________ seconds. Repeat __________ times. Complete this exercise __________ times a day. Plank, prone This exercise strengthens muscles in your abdomen and core area. Lie on your abdomen on the floor (prone position),and prop yourself up on your elbows. Your hands should be straight out in front of you, and your elbows should be below your shoulders. Position your feet similar to a push-up position so your toes  are on the ground. Tighten your abdominal muscles and lift your body off the floor. Do not arch your back. Do not hold your breath. Hold this position for __________ seconds. Repeat __________ times. Complete this exercise __________ times a day. This information is not intended to replace advice given to you by your health care provider. Make sure you discuss any questions you have with your health care provider. Document Revised: 03/04/2019 Document Reviewed: 11/09/2018 Elsevier Patient Education  [employer].

## 2021-09-26 ENCOUNTER — Encounter: Payer: Self-pay | Admitting: Internal Medicine

## 2021-09-27 ENCOUNTER — Other Ambulatory Visit: Payer: Self-pay | Admitting: Internal Medicine

## 2021-09-27 MED ORDER — OFLOXACIN 0.3 % OT SOLN: 5.0000 [drp] | Freq: Two times a day (BID) | OTIC | 0 refills | Status: DC

## 2021-09-27 MED ORDER — NEOMYCIN-POLYMYXIN-HC 3.5-10000-1 OT SOLN: 4.0000 [drp] | Freq: Four times a day (QID) | OTIC | 0 refills | Status: DC

## 2021-09-27 NOTE — Progress Notes (Signed)
Chief Complaint  Patient presents with   Follow-up   F/u  1. C/o sinus pressure and pain and right ear pain tried flonase w/o relief  2. Osteoporosis will get reclast next month  3. Dr. Gretta Cool [facility]  Tear hamstring and gluteus medius right s/p surgery Dr. Christoper Fabian ortho she wants 2nd opinion from this doctor if needed or waht to do next  4. Recurrent thrust to mouth   Review of Systems  Constitutional:  Negative for weight loss.  HENT:  Negative for hearing loss.   Eyes:  Negative for blurred vision.  Respiratory:  Negative for shortness of breath.   Cardiovascular:  Negative for chest pain.  Gastrointestinal:  Negative for abdominal pain and blood in stool.  Musculoskeletal:  Positive for joint pain. Negative for back pain.  Skin:  Negative for rash.  Neurological:  Negative for headaches.  Psychiatric/Behavioral:  Negative for depression.   Past Medical History:  Diagnosis Date   Arthritis    neck, back, left knee;    Breast cancer ([place]) 2009   right lumpectomy s/p radiation x 1 week 2x per day only    Chicken pox    Chronic UTI    established with urology    COVID-19    ? 12/2020 sob  took antiviral, 08/15/21   Diverticular disease    -osis and -itis    Esophagitis    egd 05/17/14 see report scanned into chart   History of kidney stones    Hormone disorder    Hyperlipidemia    Hypertension    controlled well with medication;    Hypothyroidism    Osteoporosis    Personal history of radiation therapy 2009   F/U right breast cancer   Thyroid disease    hypothyroidism    Past Surgical History:  Procedure Laterality Date   BONE GRAFT HIP ILIAC CREST     + cage left hip 10/23/17    BREAST BIOPSY Left 2009   clip,benign   BREAST BIOPSY Right 2009   +   BREAST LUMPECTOMY Right 2009   2009 lumpectomy    CARDIAC CATHETERIZATION     No stents; [facility] doesn't recall facility +41yrs ago   CATARACT EXTRACTION, BILATERAL Bilateral 10/2019    COLONOSCOPY WITH PROPOFOL N/A 05/15/2020   Procedure: COLONOSCOPY WITH PROPOFOL;  Surgeon: Lin Landsman, MD;  Location: [facility];  Service: Gastroenterology;  Laterality: N/A;   EYE SURGERY     b/l cataracts sch 10/2019 in [place] N/A 05/29/2018   Procedure: INSERTION OF MESH;  Surgeon: Johnathan Hausen, MD;  Location: [facility];  Service: General;  Laterality: N/A;   JOINT REPLACEMENT     QUADRICEPS TENDON REPAIR Right 12/05/2020   Procedure: OPEN REPAIR OF RIGHT GLUTEUS MINIMUS TENDON;  Surgeon: Corky Mull, MD;  Location: [facility];  Service: Orthopedics;  Laterality: Right;   right gluteus medius/minimus repair Dr. Roland Rack 11/2020      Dr. Roland Rack   SHOULDER SURGERY Left 2008, 2009   x2 rotator cuff surgeries left shoulders   SPINAL FUSION  07/24/2020   SPINE SURGERY  10/23/2017   spinal 09/2017 h/o scoliosis [facility] L4-S1 OLIF T10 to ililum fusion   TONSILLECTOMY     age 50 y.o.    TOTAL SHOULDER REPLACEMENT Right 2012   TUBAL LIGATION     VENTRAL HERNIA REPAIR N/A 05/29/2018   Procedure: LAPAROSCOPIC VENTRAL / LATERAL HERNIA  REPAIR;  Surgeon: Johnathan Hausen, MD;  Location: [facility];  Service: General;  Laterality: N/A;   Family History  Problem Relation Age of Onset   Arthritis Mother    Heart disease Mother 77       stent    Hyperlipidemia Mother    Hypertension Mother    Coronary artery disease Mother 33   Arthritis Father    Diabetes Father    Cancer Father        colon   AAA (abdominal aortic aneurysm) Father    Arthritis Sister    Hyperlipidemia Sister    Hypertension Sister    Cancer Brother        lung, smoker   Mental retardation Brother    Drug abuse Daughter        overdose in 2018    Arthritis Sister    Hyperlipidemia Sister    Bladder Cancer Neg Hx    Kidney cancer Neg Hx    Breast cancer Neg Hx    Social History   Socioeconomic History   Marital status: Widowed    Spouse name: Not on file   Number of children:  2   Years of education: Not on file   Highest education level: Not on file  Occupational History   Not on file  Tobacco Use   Smoking status: Never   Smokeless tobacco: Never  Vaping Use   Vaping Use: Never used  Substance and Sexual Activity   Alcohol use: Not Currently    Alcohol/week: 7.0 standard drinks    Types: 7 Glasses of wine per week   Drug use: No   Sexual activity: Yes    Birth control/protection: Post-menopausal  Other Topics Concern   Not on file  Social History Narrative   College grad   widowed husband died 08/05/19    Wears seatbelt and safe in relationship    3 kids : 1 daughter passed      [place] Strain: Not on file  Food Insecurity: Not on file  Transportation Needs: Not on file  Physical Activity: Not on file  Stress: Not on file  Social Connections: Not on file  Intimate Partner Violence: Not on file   Current Meds  Medication Sig   atorvastatin (LIPITOR) 20 MG tablet Take 1 tablet (20 mg total) by mouth every other day. D/c 10 mg pill increased dose   [EXPIRED] azithromycin (ZITHROMAX) 250 MG tablet Take 2 tablets on day 1, then 1 tablet daily on days 2 through 5 with food   Calcium Carb-Cholecalciferol ([employer] CALCIUM 600 & VITAMIN D3 PO) Take 1 tablet by mouth daily.   [EXPIRED] fluconazole (DIFLUCAN) 150 MG tablet Take 1 tablet (150 mg total) by mouth once for 1 dose.   fluticasone (FLONASE) 50 MCG/ACT nasal spray Place 2 sprays into both nostrils daily. Max b/l nostrils   levothyroxine (SYNTHROID) 88 MCG tablet Take 1 tablet (88 mcg total) by mouth daily before breakfast. 30 minutes   Multiple Vitamin (MULTI-VITAMINS) TABS Take 1 tablet by mouth daily.    neomycin-polymyxin-hydrocortisone (CORTISPORIN) OTIC solution Place 4 drops into the right ear 4 (four) times daily. X4-7 days   nystatin (MYCOSTATIN) 100000 UNIT/ML suspension Take 5 mLs (500,000 Units total) by mouth 4 (four)  times daily. Prn thrush   ofloxacin (FLOXIN) 0.3 % OTIC solution Place 5 drops into the right ear 2 (two) times daily. X4-7 days   telmisartan (  MICARDIS) 80 MG tablet Take 1 tablet (80 mg total) by mouth daily. In place of losartan 100. Take 1/2 pill day 3 days and if BP >130/>80 take 1 pill day 4 and beyond daily   vitamin B-12 (CYANOCOBALAMIN) 1000 MCG tablet Take 1,000 mcg by mouth daily.   [DISCONTINUED] ciprofloxacin-dexamethasone (CIPRODEX) OTIC suspension Place 4 drops into the right ear 2 (two) times daily. 4-7 days   [DISCONTINUED] zolpidem (AMBIEN) 5 MG tablet Take 2.5 mg by mouth at bedtime as needed.   Allergies  Allergen Reactions   Sulfa Antibiotics Anaphylaxis, Hives, Itching, Shortness Of Breath and Swelling    Lips & throat swelled   Nitrofuran Derivatives Swelling    Lip swelling; "eyes get red and puffy"   Amlodipine Besylate Other (See Comments)    Gas, bloating   Gas, bloating  Heart racing    No results found for this or any previous visit (from the past 2160 hour(s)). Objective  Body mass index is 26.34 kg/m. Wt Readings from Last 3 Encounters:  09/25/21 130 lb 6.4 oz (59.1 kg)  08/15/21 129 lb 1 oz (58.5 kg)  08/09/21 128 lb (58.1 kg)   Temp Readings from Last 3 Encounters:  09/25/21 98.3 F (36.8 C) (Oral)  08/15/21 98.6 F (37 C) (Temporal)  08/09/21 (!) 96.3 F (35.7 C) (Temporal)   BP Readings from Last 3 Encounters:  09/25/21 128/84  08/09/21 (!) 151/71  07/26/21 (!) 142/60   Pulse Readings from Last 3 Encounters:  09/25/21 88  08/09/21 66  07/26/21 76    Physical Exam Vitals and nursing note reviewed.  Constitutional:      Appearance: Normal appearance. She is well-developed and well-groomed.  HENT:     Head: Normocephalic and atraumatic.     Right Ear: Tympanic membrane is injected.  Eyes:     Conjunctiva/sclera: Conjunctivae normal.     Pupils: Pupils are equal, round, and reactive to light.  Cardiovascular:     Rate and  Rhythm: Normal rate and regular rhythm.     Heart sounds: Normal heart sounds. No murmur heard. Pulmonary:     Effort: Pulmonary effort is normal.     Breath sounds: Normal breath sounds.  Abdominal:     General: Abdomen is flat. Bowel sounds are normal.     Tenderness: There is no abdominal tenderness.  Musculoskeletal:        General: No tenderness.  Skin:    General: Skin is warm and dry.  Neurological:     General: No focal deficit present.     Mental Status: She is alert and oriented to person, place, and time. Mental status is at baseline.     Cranial Nerves: Cranial nerves 2-12 are intact.     Gait: Gait is intact.     Comments: Walking with cane   Psychiatric:        Attention and Perception: Attention and perception normal.        Mood and Affect: Mood and affect normal.        Speech: Speech normal.        Behavior: Behavior normal. Behavior is cooperative.        Thought Content: Thought content normal.        Cognition and Memory: Cognition and memory normal.        Judgment: Judgment normal.    Assessment  Plan  Partial tear of right hamstring - Plan: Ambulatory referral to Orthopedic Surgery Dr. Gretta Cool [facility]  Tear hamstring and gluteus medius right 2nd opinion s/p surgery Tear of right gluteus medius tendon, subsequent encounter - Plan: Ambulatory referral to Orthopedic Surgery  Hypertension, improved - Plan: Comprehensive metabolic panel, Lipid panel, CBC with Differential/Platelet On toprol 50 mg and micardis 80 mg qd   Vitamin D deficiency - Plan: Vitamin D (25 hydroxy)  Hypothyroidism, unspecified type - Plan: TSH  Prediabetes - Plan: Hemoglobin A1c  Acute frontal sinusitis, recurrence not specified - Plan: azithromycin (ZITHROMAX) 250 MG tablet  Yeast vaginitis - Plan: fluconazole (DIFLUCAN) 150 MG tablet  Right otitis media, unspecified otitis media type - Plan: neomycin-polymyxin-hydrocortisone (CORTISPORIN) OTIC solution vs   ofloxacin (FLOXIN) 0.3 % OTIC solution, depending on $ get 1 or other  Thrush - Plan: nystatin (MYCOSTATIN) 100000 UNIT/ML suspension   HM HM Had flu shot utd prevnar had 12/29/17, pna 23 10/10/18  Tdap had 12/09/11 Declines shingrix, hep B vaccine prev.  zostervax had 2012  covid 19 vx pfizer 3/3 Never smoker  Consider shingrix vaccine given Rx 07/11/20    s/p right breast lumpectomy 2009 and radiation only  -mammogram 05/31/19 negative, 06/05/20 negative  mammo ordered 05/2021  negative 06/11/21   Pap out of age window no h/o abnormal pap  DEXA had 04/28/13 h/o osteopenia, +osteoporosis 12/27/19  06/02/20 T -3.0, -3.2, -3.7 Could not tolerate forteo had reclast influsion 11/23/20 or 11/24/2020 f/u in 1 year 2nd infusion [facility] sch 10/2021 2nd infusion reclast   Colonoscopy copy obtained reviewed 01/10/09 grade 1 IH and diverticulosis  -colonoscopy due at f/u will hold for now with other multiple appts  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 [name]   rec healthy diet and exercise  Provider: Dr. Olivia Mackie McLean-Scocuzza-[facility]

## 2021-10-02 DIAGNOSIS — M810 Age-related osteoporosis without current pathological fracture: Secondary | ICD-10-CM | POA: Diagnosis not present

## 2021-10-04 DIAGNOSIS — M25562 Pain in left knee: Secondary | ICD-10-CM | POA: Diagnosis not present

## 2021-10-04 DIAGNOSIS — M25551 Pain in right hip: Secondary | ICD-10-CM | POA: Diagnosis not present

## 2021-10-04 DIAGNOSIS — Z20822 Contact with and (suspected) exposure to covid-19: Secondary | ICD-10-CM | POA: Diagnosis not present

## 2021-10-04 DIAGNOSIS — M25552 Pain in left hip: Secondary | ICD-10-CM | POA: Diagnosis not present

## 2021-10-04 DIAGNOSIS — M545 Low back pain, unspecified: Secondary | ICD-10-CM | POA: Diagnosis not present

## 2021-10-05 ENCOUNTER — Other Ambulatory Visit: Payer: Medicare Other

## 2021-10-08 ENCOUNTER — Other Ambulatory Visit (INDEPENDENT_AMBULATORY_CARE_PROVIDER_SITE_OTHER): Payer: Medicare Other

## 2021-10-08 ENCOUNTER — Other Ambulatory Visit: Payer: Self-pay

## 2021-10-08 DIAGNOSIS — E039 Hypothyroidism, unspecified: Secondary | ICD-10-CM | POA: Diagnosis not present

## 2021-10-08 DIAGNOSIS — E559 Vitamin D deficiency, unspecified: Secondary | ICD-10-CM | POA: Diagnosis not present

## 2021-10-08 DIAGNOSIS — R7303 Prediabetes: Secondary | ICD-10-CM

## 2021-10-08 DIAGNOSIS — I10 Essential (primary) hypertension: Secondary | ICD-10-CM | POA: Diagnosis not present

## 2021-10-08 LAB — COMPREHENSIVE METABOLIC PANEL
ALT: 13 U/L (ref 0–35)
AST: 19 U/L (ref 0–37)
Albumin: 4.5 g/dL (ref 3.5–5.2)
Alkaline Phosphatase: 88 U/L (ref 39–117)
BUN: 20 mg/dL (ref 6–23)
CO2: 31 mEq/L (ref 19–32)
Calcium: 9.5 mg/dL (ref 8.4–10.5)
Chloride: 102 mEq/L (ref 96–112)
Creatinine, Ser: 0.59 mg/dL (ref 0.40–1.20)
GFR: 88.5 mL/min (ref >60.00)
Glucose, Bld: 95 mg/dL (ref 70–99)
Potassium: 4 mEq/L (ref 3.5–5.1)
Sodium: 140 mEq/L (ref 135–145)
Total Bilirubin: 0.6 mg/dL (ref 0.2–1.2)
Total Protein: 7.1 g/dL (ref 6.0–8.3)

## 2021-10-08 LAB — CBC WITH DIFFERENTIAL/PLATELET
Basophils Absolute: 0 10*3/uL (ref 0.0–0.1)
Basophils Relative: 1 % (ref 0.0–3.0)
Eosinophils Absolute: 0.2 10*3/uL (ref 0.0–0.7)
Eosinophils Relative: 5 % (ref 0.0–5.0)
HCT: 41.4 % (ref 36.0–46.0)
Hemoglobin: 13.5 g/dL (ref 12.0–15.0)
Lymphocytes Relative: 19.4 % (ref 12.0–46.0)
Lymphs Abs: 0.9 10*3/uL (ref 0.7–4.0)
MCHC: 32.7 g/dL (ref 30.0–36.0)
MCV: 87.3 fl (ref 78.0–100.0)
Monocytes Absolute: 0.3 10*3/uL (ref 0.1–1.0)
Monocytes Relative: 5.3 % (ref 3.0–12.0)
Neutro Abs: 3.3 10*3/uL (ref 1.4–7.7)
Neutrophils Relative %: 69.3 % (ref 43.0–77.0)
Platelets: 216 10*3/uL (ref 150.0–400.0)
RBC: 4.75 Mil/uL (ref 3.87–5.11)
RDW: 15.8 % — ABNORMAL HIGH (ref 11.5–15.5)
WBC: 4.8 10*3/uL (ref 4.0–10.5)

## 2021-10-08 LAB — HEMOGLOBIN A1C: Hgb A1c MFr Bld: 6 % (ref 4.6–6.5)

## 2021-10-08 LAB — LIPID PANEL
Cholesterol: 239 mg/dL — ABNORMAL HIGH (ref 0–200)
HDL: 69.2 mg/dL (ref >39.00)
LDL Cholesterol: 138 mg/dL — ABNORMAL HIGH (ref 0–99)
NonHDL: 169.55
Total CHOL/HDL Ratio: 3
Triglycerides: 159 mg/dL — ABNORMAL HIGH (ref 0.0–149.0)
VLDL: 31.8 mg/dL (ref 0.0–40.0)

## 2021-10-08 LAB — VITAMIN D 25 HYDROXY (VIT D DEFICIENCY, FRACTURES): VITD: 92.18 ng/mL (ref 30.00–100.00)

## 2021-10-08 LAB — TSH: TSH: 2.35 u[IU]/mL (ref 0.35–5.50)

## 2021-10-08 NOTE — Progress Notes (Signed)
Prediabetes worse Rec healthy diet choices exercise if able now kind of limited  Cholesterol worse  -is she taking lipitor daily? Does she want to increase to 40 mg?  F/u cards Mail cholesterol handout  Liver kidneys normal  Vit D levels still borderline but no longer elevated  If still not taking vitamin D ok with this if taking and when resumes would not rec > 1000 IU daily  Thyroid lab normal  Blood cts normal

## 2021-10-09 ENCOUNTER — Ambulatory Visit (INDEPENDENT_AMBULATORY_CARE_PROVIDER_SITE_OTHER): Payer: Medicare Other

## 2021-10-09 VITALS — Ht 59.0 in | Wt 130.0 lb

## 2021-10-09 DIAGNOSIS — Z0000 Encounter for general adult medical examination without abnormal findings: Secondary | ICD-10-CM | POA: Diagnosis not present

## 2021-10-09 NOTE — Patient Instructions (Addendum)
  Maria Spencer , Thank you for taking time to come for your Medicare Wellness Visit. I appreciate your ongoing commitment to your health goals. Please review the following plan we discussed and let me know if I can assist you in the future.   These are the goals we discussed:  Goals       Patient Stated     Maintain Healthy Lifestyle (pt-stated)      I want to walk for exercise not using a cane Eat a healthy diet         This is a list of the screening recommended for you and due dates:  Health Maintenance  Topic Date Due   Zoster (Shingles) Vaccine (1 of 2) 01/02/2022*   Tetanus Vaccine  12/08/2021   Mammogram  06/12/2023   Colon Cancer Screening  05/16/2027   Pneumonia Vaccine  Completed   Flu Shot  Completed   DEXA scan (bone density measurement)  Completed   COVID-19 Vaccine  Completed   Hepatitis C Screening: USPSTF Recommendation to screen - Ages 65-79 yo.  Completed   HPV Vaccine  Aged Out  *Topic was postponed. The date shown is not the original due date.

## 2021-10-09 NOTE — Progress Notes (Signed)
Subjective:   Maria Spencer is a 75 y.o. female who presents for Medicare Annual (Subsequent) preventive examination.  Review of Systems    No ROS.  Medicare Wellness Virtual Visit.  Visual/audio telehealth visit, UTA vital signs.   See social history for additional risk factors.   Cardiac Risk Factors include: advanced age (>38men, >36 women)     Objective:    Today's Vitals   10/09/21 0904  Weight: 130 lb (59 kg)  Height: 4\' 11"  (1.499 m)   Body mass index is 26.26 kg/m.  Advanced Directives 10/09/2021 03/15/2021 03/15/2021 12/05/2020 12/05/2020 11/29/2020 10/02/2020  Does Patient Have a Medical Advance Directive? Yes No No Yes Yes Yes Yes  Type of Paramedic of [place];Living will - - [place];Living will [place];Living will [place];Living will [place];Living will  Does patient want to make changes to medical advance directive? No - Patient declined - - No - Patient declined - No - Patient declined -  Copy of [place] in Chart? Yes - validated most recent copy scanned in chart (See row information) - - No - copy requested No - copy requested No - copy requested -  Would patient like information on creating a medical advance directive? - No - Patient declined - - - - -    Current Medications (verified) Outpatient Encounter Medications as of 10/09/2021  Medication Sig   atorvastatin (LIPITOR) 20 MG tablet Take 1 tablet (20 mg total) by mouth every other day. D/c 10 mg pill increased dose   Calcium Carb-Cholecalciferol ([employer] CALCIUM 600 & VITAMIN D3 PO) Take 1 tablet by mouth daily.   fluticasone (FLONASE) 50 MCG/ACT nasal spray Place 2 sprays into both nostrils daily. Max b/l nostrils   levothyroxine (SYNTHROID) 88 MCG tablet Take 1 tablet (88 mcg total) by mouth daily before breakfast. 30 minutes   metoprolol succinate (TOPROL-XL) 50 MG 24 hr tablet Take 1  tablet (50 mg total) by mouth daily. Take with or immediately following a meal.   Multiple Vitamin (MULTI-VITAMINS) TABS Take 1 tablet by mouth daily.    ofloxacin (FLOXIN) 0.3 % OTIC solution Place 5 drops into the right ear 2 (two) times daily. X4-7 days   telmisartan (MICARDIS) 80 MG tablet Take 1 tablet (80 mg total) by mouth daily. In place of losartan 100. Take 1/2 pill day 3 days and if BP >130/>80 take 1 pill day 4 and beyond daily   vitamin B-12 (CYANOCOBALAMIN) 1000 MCG tablet Take 1,000 mcg by mouth daily.   zolpidem (AMBIEN) 5 MG tablet Take 0.5 tablets (2.5 mg total) by mouth at bedtime as needed.   No facility-administered encounter medications on file as of 10/09/2021.    Allergies (verified) Sulfa antibiotics, Nitrofuran derivatives, and Amlodipine besylate   History: Past Medical History:  Diagnosis Date   Arthritis    neck, back, left knee;    Breast cancer ([facility]) 2009   right lumpectomy s/p radiation x 1 week 2x per day only    Chicken pox    Chronic UTI    established with urology    COVID-19    ? 12/2020 sob  took antiviral, 08/15/21   Diverticular disease    -osis and -itis    Esophagitis    egd 05/17/14 see report scanned into chart   History of kidney stones    Hormone disorder    Hyperlipidemia    Hypertension  controlled well with medication;    Hypothyroidism    Osteoporosis    Personal history of radiation therapy 2009   F/U right breast cancer   Thyroid disease    hypothyroidism    Past Surgical History:  Procedure Laterality Date   BONE GRAFT HIP ILIAC CREST     + cage left hip 10/23/17    BREAST BIOPSY Left 2009   clip,benign   BREAST BIOPSY Right 2009   +   BREAST LUMPECTOMY Right 2009   2009 lumpectomy    CARDIAC CATHETERIZATION     No stents; [facility] doesn't recall facility +51yrs ago   CATARACT EXTRACTION, BILATERAL Bilateral 10/2019   COLONOSCOPY WITH PROPOFOL N/A 05/15/2020   Procedure: COLONOSCOPY WITH PROPOFOL;  Surgeon:  Lin Landsman, MD;  Location: [facility];  Service: Gastroenterology;  Laterality: N/A;   EYE SURGERY     b/l cataracts sch 10/2019 in [place] N/A 05/29/2018   Procedure: INSERTION OF MESH;  Surgeon: Johnathan Hausen, MD;  Location: [facility];  Service: General;  Laterality: N/A;   JOINT REPLACEMENT     QUADRICEPS TENDON REPAIR Right 12/05/2020   Procedure: OPEN REPAIR OF RIGHT GLUTEUS MINIMUS TENDON;  Surgeon: Corky Mull, MD;  Location: [facility];  Service: Orthopedics;  Laterality: Right;   right gluteus medius/minimus repair Dr. Roland Rack 11/2020      Dr. Roland Rack   SHOULDER SURGERY Left 2008, 2009   x2 rotator cuff surgeries left shoulders   SPINAL FUSION  07/24/2020   SPINE SURGERY  10/23/2017   spinal 09/2017 h/o scoliosis [facility] L4-S1 OLIF T10 to ililum fusion   TONSILLECTOMY     age 70 y.o.    TOTAL SHOULDER REPLACEMENT Right 2012   TUBAL LIGATION     VENTRAL HERNIA REPAIR N/A 05/29/2018   Procedure: LAPAROSCOPIC VENTRAL / LATERAL HERNIA REPAIR;  Surgeon: Johnathan Hausen, MD;  Location: [facility];  Service: General;  Laterality: N/A;   Family History  Problem Relation Age of Onset   Arthritis Mother    Heart disease Mother 83       stent    Hyperlipidemia Mother    Hypertension Mother    Coronary artery disease Mother 32   Arthritis Father    Diabetes Father    Cancer Father        colon   AAA (abdominal aortic aneurysm) Father    Arthritis Sister    Hyperlipidemia Sister    Hypertension Sister    Cancer Brother        lung, smoker   Mental retardation Brother    Drug abuse Daughter        overdose in 2018    Arthritis Sister    Hyperlipidemia Sister    Bladder Cancer Neg Hx    Kidney cancer Neg Hx    Breast cancer Neg Hx    Social History   Socioeconomic History   Marital status: Widowed    Spouse name: Not on file   Number of children: 2   Years of education: Not on file   Highest education level: Not on file  Occupational  History   Not on file  Tobacco Use   Smoking status: Never   Smokeless tobacco: Never  Vaping Use   Vaping Use: Never used  Substance and Sexual Activity   Alcohol use: Not Currently    Alcohol/week: 7.0 standard drinks    Types: 7 Glasses of wine  per week   Drug use: No   Sexual activity: Yes    Birth control/protection: Post-menopausal  Other Topics Concern   Not on file  Social History Narrative   College grad   widowed husband died 2019/07/29    Wears seatbelt and safe in relationship    3 kids : 1 daughter passed      [place] Resource Strain: Low Risk    Difficulty of Paying Living Expenses: Not hard at all  Food Insecurity: No Food Insecurity   Worried About Charity fundraiser in the Last Year: Never true   Arboriculturist in the Last Year: Never true  Transportation Needs: No Transportation Needs   Lack of Transportation (Medical): No   Lack of Transportation (Non-Medical): No  Physical Activity: Insufficiently Active   Days of Exercise per Week: 2 days   Minutes of Exercise per Session: 60 min  Stress: Stress Concern Present   Feeling of Stress : To some extent  Social Connections: Unknown   Frequency of Communication with Friends and Family: More than three times a week   Frequency of Social Gatherings with Friends and Family: Not on file   Attends Religious Services: Not on file   Active Member of Clubs or Organizations: Yes   Attends Archivist Meetings: More than 4 times per year   Marital Status: Widowed    Tobacco Counseling Counseling given: Not Answered   Clinical Intake:  Pre-visit preparation completed: Yes        Diabetes: No  How often do you need to have someone help you when you read instructions, pamphlets, or other written materials from your doctor or pharmacy?: 1 - Never    Interpreter Needed?: No      Activities of Daily Living In your present state of  health, do you have any difficulty performing the following activities: 10/09/2021 03/15/2021  Hearing? N N  Vision? Y N  Comment Followed by ophthalmologist -  Difficulty concentrating or making decisions? N N  Walking or climbing stairs? Y N  Comment Ambulates with cane. -  Dressing or bathing? N N  Doing errands, shopping? N N  Preparing Food and eating ? N -  Using the Toilet? N -  In the past six months, have you accidently leaked urine? N -  Do you have problems with loss of bowel control? N -  Managing your Medications? N -  Managing your Finances? N -  Housekeeping or managing your Housekeeping? N -  Some recent data might be hidden    Patient Care Team: McLean-Scocuzza, Nino Glow, MD as PCP - General (Internal Medicine) End, Harrell Gave, MD as PCP - Cardiology (Cardiology) Johnathan Hausen, MD as Consulting Physician (General Surgery) Kaleen Mask, NP as Nurse Practitioner (Neurosurgery) [facility], Sonda Rumble, MD as Consulting Physician (Neurosurgery) Earl Many, MD as Consulting Physician (Urology)  Indicate any recent [place] you may have received from other than [facility] providers in the past year (date may be approximate).     Assessment:   This is a routine wellness examination for Maria Spencer.  Virtual Visit via Telephone Note  I connected with  [name] on 10/09/21 at  9:00 AM EST by telephone and verified that I am speaking with the correct person using two identifiers.  Location: Patient: home Provider: office Persons participating in the virtual visit: patient/Nurse Health Advisor   I discussed the limitations, risks,  security and privacy concerns of performing an evaluation and management service by telephone and the availability of in person appointments. The patient expressed understanding and agreed to proceed.  Interactive audio and video telecommunications were attempted between this nurse and patient, however failed, due to patient  having technical difficulties OR patient did not have access to video capability.  We continued and completed visit with audio only.  Some vital signs may be absent or patient reported.   Hearing/Vision screen Hearing Screening - Comments:: Patient is able to hear conversational tones without difficulty. No issues reported. Vision Screening - Comments:: Followed by [facility], Dr. Prudencio Burly.  Cataracts extracted, bilateral Wears readers  Dietary issues and exercise activities discussed: Current Exercise Habits: Structured exercise class Monitors combination of carbs and protein Good water intake   Goals Addressed               This Visit's Progress     Patient Stated     Maintain Healthy Lifestyle (pt-stated)        I want to walk for exercise not using a cane Eat a healthy diet        Depression Screen PHQ 2/9 Scores 10/09/2021 10/02/2020 09/13/2020 09/12/2020 06/06/2020 05/11/2020 05/08/2020  PHQ - 2 Score - 0 0 0 0 0 0  PHQ- 9 Score - - - - - - -  Exception Documentation (No Data) - - - - - -    Fall Risk Fall Risk  10/09/2021 09/25/2021 08/09/2021 07/17/2021 07/03/2021  Falls in the past year? 0 0 0 0 0  Number falls in past yr: - 0 0 - 0  Injury with Fall? - 0 - - -  Comment - - - - -  Risk for fall due to : - Impaired balance/gait No Fall Risks - No Fall Risks  Risk for fall due to: Comment - - - - -  Follow up Falls evaluation completed Falls evaluation completed Falls evaluation completed - Falls evaluation completed    FALL RISK PREVENTION PERTAINING TO THE HOME: Adequate lighting in your home to reduce risk of falls? Yes   ASSISTIVE DEVICES UTILIZED TO PREVENT FALLS: Life alert? No  Use of a cane, walker or w/c? Yes  TIMED UP AND GO: Was the test performed? No .   Cognitive Function:  Patient is alert and oriented x3.    6CIT Screen 09/08/2018  What Year? 0 points  What month? 0 points  What time? 0 points  Count back from 20 0 points   Months in reverse 0 points  Repeat phrase 0 points  Total Score 0    Immunizations Immunization History  Administered Date(s) Administered   Influenza Split 11/25/2005, 08/25/2012   Influenza, High Dose Seasonal PF 08/13/2016, 08/02/2017, 08/07/2018, 08/09/2019, 08/29/2020, 09/10/2021   Influenza,inj,Quad PF,6+ Mos 08/25/2014, 08/26/2015   Influenza-Unspecified 08/25/2017   PFIZER(Purple Top)SARS-COV-2 Vaccination 12/24/2019, 01/07/2020, 08/29/2020   Pfizer Covid-19 Vaccine Bivalent Booster 66yrs & up 09/10/2021   Pneumococcal Conjugate-13 01/30/2015, 12/29/2017   Pneumococcal Polysaccharide-23 11/26/2011, 12/09/2011, 10/10/2018   Tdap 12/09/2011   Zoster, Live 11/26/2010   Screening Tests Health Maintenance  Topic Date Due   Zoster Vaccines- Shingrix (1 of 2) 01/02/2022 (Originally 11/24/1965)   TETANUS/TDAP  12/08/2021   MAMMOGRAM  06/12/2023   COLONOSCOPY (Pts 45-23yrs Insurance coverage will need to be confirmed)  05/16/2027   Pneumonia Vaccine 24+ Years old  Completed   INFLUENZA VACCINE  Completed   DEXA SCAN  Completed   COVID-19 Vaccine  Completed   Hepatitis C Screening  Completed   HPV VACCINES  Aged Out    Health Maintenance There are no preventive care reminders to display for this patient.  Lung Cancer Screening: (Low Dose CT Chest recommended if Age 72-80 years, 30 pack-year currently smoking OR have quit w/in 15years.) does not qualify.   Vision Screening: Recommended annual ophthalmology exams for early detection of glaucoma and other disorders of the eye.  Dental Screening: Recommended annual dental exams for proper oral hygiene  Community Resource Referral / Chronic Care Management: CRR required this visit?  No   CCM required this visit?  No      Plan:   Keep all routine maintenance appointments.   I have personally reviewed and noted the following in the patient's chart:   Medical and social history Use of alcohol, tobacco or illicit drugs   Current medications and supplements including opioid prescriptions. Not taking opioid.  Functional ability and status Nutritional status Physical activity Advanced directives List of other physicians Hospitalizations, surgeries, and ER visits in previous 12 months Vitals Screenings to include cognitive, depression, and falls Referrals and appointments  In addition, I have reviewed and discussed with patient certain preventive protocols, quality metrics, and best practice recommendations. A written personalized care plan for preventive services as well as general preventive health recommendations were provided to patient.     Varney Biles, LPN   [id number]

## 2021-10-10 DIAGNOSIS — M25551 Pain in right hip: Secondary | ICD-10-CM | POA: Diagnosis not present

## 2021-10-10 DIAGNOSIS — M25552 Pain in left hip: Secondary | ICD-10-CM | POA: Diagnosis not present

## 2021-10-10 DIAGNOSIS — R262 Difficulty in walking, not elsewhere classified: Secondary | ICD-10-CM | POA: Diagnosis not present

## 2021-10-11 ENCOUNTER — Other Ambulatory Visit: Payer: Self-pay

## 2021-10-11 ENCOUNTER — Ambulatory Visit: Payer: Medicare Other | Attending: Pain Medicine | Admitting: Pain Medicine

## 2021-10-11 VITALS — BP 140/75 | HR 85 | Temp 97.1°F | Resp 16 | Ht 59.0 in | Wt 128.0 lb

## 2021-10-11 DIAGNOSIS — M25552 Pain in left hip: Secondary | ICD-10-CM

## 2021-10-11 DIAGNOSIS — M25562 Pain in left knee: Secondary | ICD-10-CM

## 2021-10-11 DIAGNOSIS — M79604 Pain in right leg: Secondary | ICD-10-CM

## 2021-10-11 DIAGNOSIS — M25551 Pain in right hip: Secondary | ICD-10-CM

## 2021-10-11 DIAGNOSIS — M7122 Synovial cyst of popliteal space [Baker], left knee: Secondary | ICD-10-CM

## 2021-10-11 DIAGNOSIS — G8929 Other chronic pain: Secondary | ICD-10-CM | POA: Diagnosis not present

## 2021-10-11 DIAGNOSIS — R262 Difficulty in walking, not elsewhere classified: Secondary | ICD-10-CM | POA: Diagnosis not present

## 2021-10-11 DIAGNOSIS — M1712 Unilateral primary osteoarthritis, left knee: Secondary | ICD-10-CM

## 2021-10-11 DIAGNOSIS — G894 Chronic pain syndrome: Secondary | ICD-10-CM

## 2021-10-11 DIAGNOSIS — M546 Pain in thoracic spine: Secondary | ICD-10-CM | POA: Diagnosis not present

## 2021-10-11 MED ORDER — LIDOCAINE 5 % EX OINT
1.0000 | TOPICAL_OINTMENT | Freq: Four times a day (QID) | CUTANEOUS | 99 refills | Status: AC | PRN
Start: 2021-10-11 — End: 2022-01-09

## 2021-10-11 NOTE — Progress Notes (Signed)
PROVIDER NOTE: Information contained herein reflects review and annotations entered in association with encounter. Interpretation of such information and data should be left to medically-trained personnel. Information provided to patient can be located elsewhere in the medical record under "Patient Instructions". Document created using STT-dictation technology, any transcriptional errors that may result from process are unintentional.    Patient: Maria Spencer  Service Category: E/M  Provider: Gaspar Cola, MD  DOB: 1946/05/27  DOS: 10/11/2021  Specialty: Interventional Pain Management  MRN: [id number]  Setting: Ambulatory outpatient  PCP: McLean-Scocuzza, Nino Glow, MD  Type: Established Patient    Referring Provider: Orland Mustard *  Location: Office  Delivery: Face-to-face     HPI  [name], a 75 y.o. year old female, is here today because of her Left lateral knee pain [M25.562]. [name] primary complain today is Knee Pain (left) Last encounter: [name] was on 08/09/2021. Pertinent problems: 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; Arthritis; Chronic hip pain (2ry area of Pain) (Bilateral) (R>L); Chronic low back pain with sciatica; Chronic thoracic back pain (1ry area of Pain) (Bilateral) (R>L); Fusion of spine of lumbosacral region; Lumbosacral spondylosis with radiculopathy; Chronic pain syndrome; Chronic lower extremity pain (3ry area of Pain) (Right); Chronic knee pain (4th area of Pain) (Posterior) (Left); Thoracic facet syndrome (Bilateral); Chronic sacroiliac joint pain (Bilateral); Spondylosis without myelopathy or radiculopathy, thoracic region; Closed wedge compression fracture of T8 vertebra (Maria Spencer); Osteoarthritis of hips (Bilateral); Abnormal thoracolumbar CT myelogram (01/05/2020); Breast cancer (Maria Spencer); Fusion of thoracolumbar spine (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); DDD (degenerative disc disease), cervical; DDD (degenerative disc disease), thoracic; DDD (degenerative disc disease), lumbar; Displacement of thoracic intervertebral disc (C6-T3, T4-5, T6-7, T8-9); Non-traumatic compression fracture of T8 thoracic vertebra, sequela; Atypical chest pain; Cervicalgia; Chronic upper extremity pain (Right); Cervical spondylosis with radiculopathy (Right); Cervical radiculopathy at C7 (Right); Chronic shoulder pain (Bilateral) (R>L); Chronic shoulder pain after shoulder replacement (Right); Osteoarthritis of AC (acromioclavicular) joints (Bilateral); Chronic Acromioclavicular (AC) joint pain (Bilateral); Osteoarthritis of glenohumeral joint (Left); Tear of right gluteus minimus tendon; Abnormal MRI, cervical spine (09/26/2020); Cervical facet syndrome (Bilateral); Cervicogenic headache (Bilateral); Occipital neuralgia (greater occipital nerve) (Bilateral); Cervico-occipital neuralgia (Bilateral); Cervical facet hypertrophy (Multilevel) (Bilateral); Spondylosis without myelopathy or radiculopathy, cervical region; Scoliosis of thoracic spine, unspecified scoliosis type; 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 Pain Assessment: Severity of Chronic pain is reported as a 6 /10. Location: Knee Left/denies. Onset: More than a month ago. Quality: Aching. Timing: Constant. Modifying factor(s): ice. Vitals:  height is $RemoveB'4\' 11"'eTTJVayW$  (1.499 m) and weight is 128 lb (58.1 kg). Her temporal temperature is 97.1 F (36.2 C) (abnormal). Her blood pressure is 140/75 and her pulse is 85. Her respiration is 16 and oxygen saturation is 98%.   Reason for encounter: post-procedure assessment.  The patient returns to the clinic today after having had a  radiofrequency ablation of the superolateral, inferolateral genicular nerves.  She refers that the pain above the knee joint is better and she has no pain in the superior or inferior medial aspects of the knee.  The patient indicates that she had 100% relief of the pain for the duration of the local anesthetic which  continue for an additional 1 to 2 weeks.  Fortunately, the pain in the inferior and lateral aspect of the knee returned after 2 weeks.  This area has innervation from the inferolateral genicular nerve (a branch of the common peroneal nerve) as well as the recurrent fibular nerve (another branch of the common peroneal nerve).  Today I spoke to the patient about the possibility of doing a diagnostic injection of the inferior lateral genicular nerve and the recurrent fibular nerve (recurrent genicular nerve).  If blocking these nerves provides her with good relief of the pain and no issues with proprioception, we may be able to go back and attempt a radiofrequency ablation of these nerves.  The proximity of the inferolateral genicular nerve (ILGN) to the common peroneal nerve (CPN) is a risk factor for unintended CPN block resulting in foot drop. Thus this nerve is spared if denervation is planned to treat chronic pain. Vascular or intraarticular punctures are other potential risks.  The ILGN courses around the tibial lateral epicondyle deep to the lateral collateral ligament, following the inferior lateral genicular artery, superior of the fibula head.  The recurrent peroneal nerve originates in the inferior popliteal region from the common peroneal nerve and courses horizontally around the fibula to pass just inferior of the fibula head and travel superior to the anterolateral tibial epicondyle. It accompanies the recurrent tibial artery.  Because the patient's daughter died of an overdose, she is not really interested in trying any type of opioid analgesics.  Because the patient is not  entirely sure that she wants to proceed with another intervention in the area of the knee, I have provided her with the option of doing a trial of lidocaine 5% ointment, to be applied to the area 4 times daily as needed for pain in the lateral lower quadrant of the left knee.  I will contact the patient in approximately 1 week and assess effectiveness of this trial.  Post-Procedure Evaluation  Procedure (08/09/2021):  Type: Therapeutic Superolateral and Inferolateral, Genicular Nerve Radiofrequency Ablation (destruction).            Region: Lateral, Anterior, and Medial aspects of the knee joint, above and below the knee joint proper. Level: Superior and inferior to the knee joint. Laterality: Left   Type: Local Anesthesia Local Anesthetic: Lidocaine 1-2% Sedation: Minimal Anxiolysis  Indication(s): Anxiety & Analgesia Route: Infiltration ([place]/IM) IV Access: Available     Position: Supine    Indications: 1. Chronic knee pain (4th area of Pain) (Posterior) (Left)   2. Osteoarthritis of knee (Left)   3. Baker's cyst of knee (Left)     Ms. Casanas has been dealing with the above chronic pain for longer than three months and has either failed to respond, was unable to tolerate, or simply did not get enough benefit from other more conservative therapies including, but not limited to: 1. Over-the-counter medications 2. Anti-inflammatory medications 3. Muscle relaxants 4. Membrane stabilizers 5. Opioids 6. Physical therapy and/or chiropractic manipulation 7. Modalities (Heat, ice, etc.) 8. Invasive techniques such as nerve blocks. Ms. Persing has attained more than 50% relief of the pain from diagnostic injection.   Pain Score: Pre-procedure: 2 /10 Post-procedure: 0-No pain/10   Anxiolysis: Please see nurses note.  Effectiveness during initial hour after procedure (Ultra-Short Term Relief): 100 %.  Local anesthetic used: Long-acting (4-6 hours) Effectiveness: Defined as any analgesic  benefit obtained secondary to the administration of local anesthetics. This carries significant diagnostic value as to the etiological location, or  anatomical origin, of the pain. Duration of benefit is expected to coincide with the duration of the local anesthetic used.  Effectiveness during initial 4-6 hours after procedure (Short-Term Relief): 100 %.  Long-term benefit: Defined as any relief past the pharmacologic duration of the local anesthetics.  Effectiveness past the initial 6 hours after procedure (Long-Term Relief): 30 % (lasted 1-2 weeks).  Benefits, current: Defined as benefit present at the time of this evaluation.   Analgesia: Upon further questioning, the patient continues to have an ongoing 100% relief of the pain in the area covered by the superolateral, superomedial, and inferior medial genicular nerves.  However, the area covered by the inferior lateral genicular nerve is the area where the pain returned after 2 weeks.  The explanation on this is the fact that we did not do radiofrequency ablation of the inferior lateral genicular nerve, in order to avoid completely eliminating proprioception of her left knee.  I have explained this to the patient and she understood. Function: Somewhat improved ROM: Somewhat improved  Pharmacotherapy Assessment  Analgesic: No opioid analgesics prescribed by our practice. Highest recorded MME/day: 75 mg/day MME/day: 0 mg/day   Monitoring: [place] PMP: PDMP reviewed during this encounter.       Pharmacotherapy: No side-effects or adverse reactions reported. Compliance: No problems identified. Effectiveness: Clinically acceptable.  No notes on file  UDS:  No results found for: SUMMARY   ROS  Constitutional: Denies any fever or chills Gastrointestinal: No reported hemesis, hematochezia, vomiting, or acute GI distress Musculoskeletal: Denies any acute onset joint swelling, redness, loss of ROM, or weakness Neurological: No reported episodes of  acute onset apraxia, aphasia, dysarthria, agnosia, amnesia, paralysis, loss of coordination, or loss of consciousness  Medication Review  Calcium Carb-Cholecalciferol, Multi-Vitamins, atorvastatin, fluticasone, levothyroxine, lidocaine, metoprolol succinate, telmisartan, vitamin B-12, and zolpidem  History Review  Allergy: Ms. Mirelez is allergic to sulfa antibiotics, nitrofuran derivatives, and amlodipine besylate. Drug: Ms. Glodowski  reports no history of drug use. Alcohol:  reports that she does not currently use alcohol after a past usage of about 7.0 standard drinks per week. Tobacco:  reports that she has never smoked. She has never used smokeless tobacco. Social: Ms. Sholl  reports that she has never smoked. She has never used smokeless tobacco. She reports that she does not currently use alcohol after a past usage of about 7.0 standard drinks per week. She reports that she does not use drugs. Medical:  has a past medical history of Arthritis, Breast cancer ([place]) (2009), Chicken pox, Chronic UTI, COVID-19, Diverticular disease, Esophagitis, History of kidney stones, Hormone disorder, Hyperlipidemia, Hypertension, Hypothyroidism, Osteoporosis, Personal history of radiation therapy (2009), and Thyroid disease. Surgical: Ms. Meiser  has a past surgical history that includes Total shoulder replacement (Right, 2012); Shoulder surgery (Left, 2008, 2009); Tonsillectomy; Bone graft hip iliac crest; Spine surgery (10/23/2017); Ventral hernia repair (N/A, 05/29/2018); Insertion of mesh (N/A, 05/29/2018); Eye surgery; Cardiac catheterization; Breast lumpectomy (Right, 2009); Breast biopsy (Left, 2009); Breast biopsy (Right, 2009); Colonoscopy with propofol (N/A, 05/15/2020); Spinal fusion (07/24/2020); Joint replacement; Hernia repair; Cataract extraction, bilateral (Bilateral, 10/2019); Tubal ligation; Quadriceps tendon repair (Right, 12/05/2020); and right gluteus medius/minimus repair Dr. Roland Rack 11/2020 . Family:  family history includes AAA (abdominal aortic aneurysm) in her father; Arthritis in her father, mother, sister, and sister; Cancer in her brother and father; Coronary artery disease (age of onset: 23) in her mother; Diabetes in her father; Drug abuse in her daughter; Heart disease (age of onset: 62) in her  mother; Hyperlipidemia in her mother, sister, and sister; Hypertension in her mother and sister; Mental retardation in her brother.  Laboratory Chemistry Profile   Renal Lab Results  Component Value Date   BUN 20 10/08/2021   CREATININE 0.59 10/08/2021   BCR 28 (H) 09/10/2019   GFR 88.50 10/08/2021   GFRAA >60 05/08/2020   GFRNONAA >60 03/19/2021    Hepatic Lab Results  Component Value Date   AST 19 10/08/2021   ALT 13 10/08/2021   ALBUMIN 4.5 10/08/2021   ALKPHOS 88 10/08/2021   LIPASE 26 03/15/2021    Electrolytes Lab Results  Component Value Date   NA 140 10/08/2021   K 4.0 10/08/2021   CL 102 10/08/2021   CALCIUM 9.5 10/08/2021   MG 2.0 05/08/2020    Bone Lab Results  Component Value Date   VD25OH 92.18 10/08/2021    Inflammation (CRP: Acute Phase) (ESR: Chronic Phase) Lab Results  Component Value Date   CRP 0.6 05/08/2020   ESRSEDRATE 5 05/08/2020   LATICACIDVEN 0.7 03/15/2021         Note: Above Lab results reviewed.  Recent Imaging Review  MR PELVIS WO CONTRAST CLINICAL DATA:  Bilateral gluteal pain. Fall with popping along the right gluteal region 1 month ago with leg burning sensation. Prior right quadriceps tendon repair and right gluteus medius/minimus repairs in the past.  EXAM: MRI PELVIS WITHOUT CONTRAST  TECHNIQUE: Multiplanar multisequence MR imaging of the pelvis was performed. No intravenous contrast was administered.  COMPARISON:  Multiple exams, including CT pelvis 03/19/2021  FINDINGS: Urinary Tract:  Unremarkable  Bowel: Sigmoid colon diverticulosis with some faint hazy adjacent stranding/edema raising the possibility of  low-grade diverticulitis for example on image 13 series 9. No appreciable abscess.  Vascular/Lymphatic: No pathologic pelvic adenopathy or specific vascular abnormality identified.  Reproductive:  No abnormality identified.  Other:  No supplemental non-categorized findings.  Musculoskeletal: Anchor tracks along the right greater trochanter from prior gluteus minimus and gluteus medius reattachment. Both gluteus minimus and gluteus medius muscles are substantially atrophic as shown on the CT from 03/19/2021, and there is also chronic left gluteus minimus atrophy. Mild irregularity of the distal gluteus minimus and medius tendons without frank detachment. Trace adjacent trochanteric bursitis on the right. No obvious acute injury in this vicinity.  Tendinopathy and partial tearing of the right proximal hamstring tendon on images 20-22 of series 9. Mild tendinopathy of the left proximal hamstring tendon.  Moderate spurring of both acetabula and both femoral heads, with upper normal amount of fluid in both hip joints. Unremarkable hip adductor musculature.  Arthropathy at the pubis with associated spurring and low-grade edema. No clear tear of the adductor aponeurosis.  Mild laxity of the left anterior abdominal wall laterally  Lower lumbar, sacral, and iliac bone fixation hardware noted. The iliac screws traverse the SI joints.  IMPRESSION: 1. 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 Postoperative findings along the right greater trochanter from prior gluteus medius and gluteus minimus tendon reattachment. The distal tendons appear somewhat irregular but not ruptured. There is trace right trochanteric bursitis. 2. Sigmoid colon diverticulosis with faint adjacent edema/stranding raise the possibility of low-grade active diverticulitis. 3. Tendinopathy and partial tearing of the right proximal hamstring tendon, likely chronic.  Mild tendinopathy of the left proximal hamstring tendon. 4. 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 arthropathy of the pubis. 5. Mild laxity of the left anterior abdominal wall laterally along the upper pelvis. 6. Multilevel  lumbar fusion with lumbosacral and iliac bone fixation.  Electronically Signed   By: Van Clines M.D.   On: 09/11/2021 07:55 Note: Reviewed        Physical Exam  General appearance: Well nourished, well developed, and well hydrated. In no apparent acute distress Mental status: Alert, oriented x 3 (person, place, & time)       Respiratory: No evidence of acute respiratory distress Eyes: PERLA Vitals: BP 140/75   Pulse 85   Temp (!) 97.1 F (36.2 C) (Temporal)   Resp 16   Ht $R'4\' 11"'ZM$  (1.499 m)   Wt 128 lb (58.1 kg)   SpO2 98%   BMI 25.85 kg/m  BMI: Estimated body mass index is 25.85 kg/m as calculated from the following:   Height as of this encounter: $RemoveBeforeD'4\' 11"'PVlwtioxqJAoyJ$  (1.499 m).   Weight as of this encounter: 128 lb (58.1 kg). Ideal: Patient must be at least 60 in tall to calculate ideal body weight  Assessment   Status Diagnosis  Persistent Recurring Controlled 1. Left lateral knee pain   2. Chronic knee pain (4th area of Pain) (Posterior) (Left)   3. Chronic thoracic back pain (1ry area of Pain) (Bilateral) (R>L)   4. Chronic hip pain (2ry area of Pain) (Bilateral) (R>L)   5. Chronic lower extremity pain (3ry area of Pain) (Right)   6. Osteoarthritis of knee (Left)   7. Baker's cyst of knee (Left)   8. Chronic pain syndrome      Updated Problems: Problem  Left Lateral Knee Pain    Plan of Care  Problem-specific:  No problem-specific Assessment & Plan notes found for this encounter.  Ms. Keiaira Donlan has a current medication list which includes the following long-term medication(s): atorvastatin, fluticasone, levothyroxine, telmisartan, zolpidem, and metoprolol succinate.  Pharmacotherapy (Medications Ordered): Meds  ordered this encounter  Medications   lidocaine (XYLOCAINE) 5 % ointment    Sig: Apply 1 application topically every 6 (six) hours as needed for moderate pain. Apply to affected area    Dispense:  150 g    Refill:  PRN    Fill one day early if pharmacy is closed on scheduled refill date. May substitute for generic if available.    Orders:  No orders of the defined types were placed in this encounter.  Follow-up plan:   Return in about 1 week (around 10/18/2021) for Eval-day (M,W), (VV), for medication review (Lidocaine 5% oitment).     Interventional Therapies  Risk  Complexity Considerations:   Estimated body mass index is 25.85 kg/m as calculated from the following:   Height as of this encounter: $RemoveBeforeD'4\' 11"'OEGmlPXhemqxru$  (1.499 m).   Weight as of this encounter: 128 lb (58.1 kg). WNL   Planned  Pending:      Under consideration:   Diagnostic left inferolateral and recurrent genicular NB #1  Therapeutic left inferolateral and recurrent genicular nerve RFA #1  Diagnostic right T9-10 thoracic ESI #1  Diagnostic bilateral T9, T10, T11, and T12 MMB thoracic facet RFA (DIFICULT CANDIDATE DUE TO HARDWARE)    Completed:   Diagnostic left genicular NB x1 (07/03/2021) (100/100/50/50)  Therapeutic left genicular nerve RFA x1 (08/09/2021) (100/100/30/0)  Diagnostic bilateral cervical facet MBB x1 (10/26/2020) (100/100/0)  Diagnostic bilateral suprascapular NB x1 (10/05/2020) (100/100/100 x1 day/0)  Diagnostic right cervical ESI x1 (09/14/2020) (100/100/100/90-100)  Diagnostic/therapeutic bilateral T9-T12 thoracic facet MBB x2 (06/06/2020) (100/100/0)  Diagnostic/Therapeutic right trapezoid bursa injection x1 (11/09/2020) (did not follow-up)    Therapeutic  Palliative (PRN) options:  Therapeutic/palliative bilateral T9-12 thoracic facet MBB #3  Therapeutic/palliative right trapezoid bursa injection #2     Recent Visits Date Type Provider Dept  08/09/21 Procedure visit Milinda Pointer, MD  [facility]  07/17/21 Office Visit Milinda Pointer, MD [facility]  Showing recent visits within past 90 days and meeting all other requirements Today's Visits Date Type Provider Dept  10/11/21 Office Visit Milinda Pointer, MD [facility]  Showing today's visits and meeting all other requirements Future Appointments Date Type Provider Dept  10/17/21 Appointment Milinda Pointer, MD [facility]  Showing future appointments within next 90 days and meeting all other requirements I discussed the assessment and treatment plan with the patient. The patient was provided an opportunity to ask questions and all were answered. The patient agreed with the plan and demonstrated an understanding of the instructions.  Patient advised to call back or seek an in-person evaluation if the symptoms or condition worsens.  Duration of encounter: 40 minutes.  Note by: Gaspar Cola, MD Date: 10/11/2021; Time: 4:44 PM

## 2021-10-12 DIAGNOSIS — M25551 Pain in right hip: Secondary | ICD-10-CM | POA: Diagnosis not present

## 2021-10-15 DIAGNOSIS — R262 Difficulty in walking, not elsewhere classified: Secondary | ICD-10-CM | POA: Diagnosis not present

## 2021-10-15 DIAGNOSIS — M25552 Pain in left hip: Secondary | ICD-10-CM | POA: Diagnosis not present

## 2021-10-15 DIAGNOSIS — M25551 Pain in right hip: Secondary | ICD-10-CM | POA: Diagnosis not present

## 2021-10-16 ENCOUNTER — Telehealth: Payer: Self-pay

## 2021-10-16 NOTE — Telephone Encounter (Signed)
LM for patient to call office for pre virtual appointment questions.  

## 2021-10-17 ENCOUNTER — Other Ambulatory Visit: Payer: Self-pay

## 2021-10-17 ENCOUNTER — Ambulatory Visit: Payer: Medicare Other | Attending: Pain Medicine | Admitting: Pain Medicine

## 2021-10-17 ENCOUNTER — Telehealth: Payer: Medicare Other | Admitting: Pain Medicine

## 2021-10-17 DIAGNOSIS — M25562 Pain in left knee: Secondary | ICD-10-CM

## 2021-10-17 DIAGNOSIS — G894 Chronic pain syndrome: Secondary | ICD-10-CM

## 2021-10-17 DIAGNOSIS — M25552 Pain in left hip: Secondary | ICD-10-CM | POA: Diagnosis not present

## 2021-10-17 DIAGNOSIS — G8929 Other chronic pain: Secondary | ICD-10-CM

## 2021-10-17 DIAGNOSIS — M7122 Synovial cyst of popliteal space [Baker], left knee: Secondary | ICD-10-CM

## 2021-10-17 DIAGNOSIS — M25551 Pain in right hip: Secondary | ICD-10-CM | POA: Diagnosis not present

## 2021-10-17 DIAGNOSIS — R262 Difficulty in walking, not elsewhere classified: Secondary | ICD-10-CM | POA: Diagnosis not present

## 2021-10-17 NOTE — Progress Notes (Signed)
Patient: Maria Spencer  Service Category: E/M  Provider: Gaspar Cola, MD  DOB: 18-May-1946  DOS: 10/17/2021  Location: Office  MRN: [id number]  Setting: Ambulatory outpatient  Referring Provider: McLean-Scocuzza, Olivia Mackie *  Type: Established Patient  Specialty: Interventional Pain Management  PCP: McLean-Scocuzza, Nino Glow, MD  Location: Remote location  Delivery: TeleHealth     Virtual Encounter - Pain Management PROVIDER NOTE: Information contained herein reflects review and annotations entered in association with encounter. Interpretation of such information and data should be left to medically-trained personnel. Information provided to patient can be located elsewhere in the medical record under "Patient Instructions". Document created using STT-dictation technology, any transcriptional errors that may result from process are unintentional.    Contact & Pharmacy Preferred: [place]: [phone] (home) Mobile: [phone] (mobile) E-mail: [email or web address]  [id number] [address], [place] [address] Phone: [phone] Fax: [phone]  [facility] [place] [place] [address] [place] [address] Phone: [phone] Fax: [phone]  [id number] [place], [place] [facility] [address] [place] [address] Phone: [phone] Fax: [phone]   Pre-screening  Maria Spencer offered "in-person" vs "virtual" encounter. She indicated preferring virtual for this encounter.   Reason COVID-19*  Social distancing based on CDC and AMA recommendations.   I contacted Maria Spencer on 10/17/2021 via telephone.      I clearly identified myself as Gaspar Cola, MD. I verified that I was speaking with the correct person using two identifiers (Name: Maria Spencer, and  date of birth: 01-23-1946).  Consent I sought verbal advanced consent from Maria Spencer for virtual visit interactions. I informed Maria Spencer of possible security and privacy concerns, risks, and limitations associated with providing "not-in-person" medical evaluation and management services. I also informed Maria Spencer of the availability of "in-person" appointments. Finally, I informed her that there would be a charge for the virtual visit and that she could be  personally, fully or partially, financially responsible for it. Maria Spencer expressed understanding and agreed to proceed.   Historic Elements   Maria Spencer is a 75 y.o. year old, female patient evaluated today after our last contact on 10/11/2021. Maria Spencer  has a past medical history of Arthritis, Breast cancer ([place]) (2009), Chicken pox, Chronic UTI, COVID-19, Diverticular disease, Esophagitis, History of kidney stones, Hormone disorder, Hyperlipidemia, Hypertension, Hypothyroidism, Osteoporosis, Personal history of radiation therapy (2009), and Thyroid disease. She also  has a past surgical history that includes Total shoulder replacement (Right, 2012); Shoulder surgery (Left, 2008, 2009); Tonsillectomy; Bone graft hip iliac crest; Spine surgery (10/23/2017); Ventral hernia repair (N/A, 05/29/2018); Insertion of mesh (N/A, 05/29/2018); Eye surgery; Cardiac catheterization; Breast lumpectomy (Right, 2009); Breast biopsy (Left, 2009); Breast biopsy (Right, 2009); Colonoscopy with propofol (N/A, 05/15/2020); Spinal fusion (07/24/2020); Joint replacement; Hernia repair; Cataract extraction, bilateral (Bilateral, 10/2019); Tubal ligation; Quadriceps tendon repair (Right, 12/05/2020); and right gluteus medius/minimus repair Dr. Roland Rack 11/2020 . Ms. Latchford has a current medication list which includes the following prescription(s): atorvastatin, calcium carb-cholecalciferol, fluticasone, levothyroxine, lidocaine, metoprolol succinate, multi-vitamins,  telmisartan, vitamin b-12, and zolpidem. She  reports that she has never smoked. She has never used smokeless tobacco. She reports that she does not currently use alcohol after a past usage of about 7.0 standard drinks per week. She reports that she does not use drugs. Ms.  Innis is allergic to sulfa antibiotics, nitrofuran derivatives, and amlodipine besylate.   HPI  Today, she is being contacted for follow-up evaluation.  The patient was last seen on 10/11/2021 at which time we started the patient on a trial of lidocaine 5% topical ointment ointment every 6 hours PRN for the lateral aspect of the patient's left knee.  The patient had recently had a radiofrequency ablation of the genicular nerves in the area, but it provided her with only 2 weeks of pain relief.   I spoke to the patient about the possibility of doing a diagnostic injection of the inferior lateral genicular nerve and the recurrent fibular nerve (recurrent genicular nerve).  If blocking these nerves provides her with good relief of the pain and no issues with proprioception, we may be able to go back and attempt a radiofrequency ablation of these nerves.   The proximity of the inferolateral genicular nerve (ILGN) to the common peroneal nerve (CPN) is a risk factor for unintended CPN block resulting in foot drop. Thus this nerve is spared if denervation is planned to treat chronic pain. Vascular or intraarticular punctures are other potential risks.   The ILGN courses around the tibial lateral epicondyle deep to the lateral collateral ligament, following the inferior lateral genicular artery, superior of the fibula head.   The recurrent peroneal nerve originates in the inferior popliteal region from the common peroneal nerve and courses horizontally around the fibula to pass just inferior of the fibula head and travel superior to the anterolateral tibial epicondyle. It accompanies the recurrent tibial artery.   Because the patient's  daughter died of an overdose, she is not really interested in trying any type of opioid analgesics.   Because the patient was not entirely sure that she wanted to proceed with another intervention in the area of the knee, I provided her with the option of doing a trial of lidocaine 5% ointment, to be applied to the area 4 times daily as needed for pain in the lateral lower quadrant of the left knee.  Today the patient indicated that when she went on to the pharmacy to buy the lidocaine ointment, it was not approved by her insurance company and the cost of it was $75.  Because of this, she went ahead and got a different alternative and obtained a 4% lidocaine gel over-the-counter which she has been using.  She states that it does help, but it only last for approximately 1 hour.  I have recommended that she try a patch and perhaps this may increase the duration of the benefit.  In addition to this, I have also informed the patient that upon reviewing her last procedure, it looks like we did address the issues of the nerves going to the affected area and since the radiofrequency ablation did not really help that much, perhaps we should be looking into a different option.  She wants to stay away from pain medications because her daughter died of an overdose and she really wants to stay away from opioid analgesics.  Today in looking for options that would not involve the use of any medications, and options that may provide her with long-term control of this pain, I have suggested the possibility of doing a spinal cord stimulator trial and possible implant if the trial is successful.  Today I took the time to explain to the patient about the spinal cord stimulator and what the trial would involve.  She indicates that this is a lot of  information to process and she will like to talk to her orthopedic surgeon before she makes a decision on this.  I told her that this would be perfectly okay and to give me a call as  soon as she has decided what she wants to do.  She understood and agreed.  Pharmacotherapy Assessment   Analgesic: No opioid analgesics prescribed by our practice. Highest recorded MME/day: 75 mg/day MME/day: 0 mg/day   Monitoring: [place] PMP: PDMP reviewed during this encounter.       Pharmacotherapy: No side-effects or adverse reactions reported. Compliance: No problems identified. Effectiveness: Clinically acceptable. Plan: Refer to "POC". UDS: No results found for: SUMMARY   Laboratory Chemistry Profile   Renal Lab Results  Component Value Date   BUN 20 10/08/2021   CREATININE 0.59 10/08/2021   BCR 28 (H) 09/10/2019   GFR 88.50 10/08/2021   GFRAA >60 05/08/2020   GFRNONAA >60 03/19/2021    Hepatic Lab Results  Component Value Date   AST 19 10/08/2021   ALT 13 10/08/2021   ALBUMIN 4.5 10/08/2021   ALKPHOS 88 10/08/2021   LIPASE 26 03/15/2021    Electrolytes Lab Results  Component Value Date   NA 140 10/08/2021   K 4.0 10/08/2021   CL 102 10/08/2021   CALCIUM 9.5 10/08/2021   MG 2.0 05/08/2020    Bone Lab Results  Component Value Date   VD25OH 92.18 10/08/2021    Inflammation (CRP: Acute Phase) (ESR: Chronic Phase) Lab Results  Component Value Date   CRP 0.6 05/08/2020   ESRSEDRATE 5 05/08/2020   LATICACIDVEN 0.7 03/15/2021         Note: Above Lab results reviewed.  Imaging  MR PELVIS WO CONTRAST CLINICAL DATA:  Bilateral gluteal pain. Fall with popping along the right gluteal region 1 month ago with leg burning sensation. Prior right quadriceps tendon repair and right gluteus medius/minimus repairs in the past.  EXAM: MRI PELVIS WITHOUT CONTRAST  TECHNIQUE: Multiplanar multisequence MR imaging of the pelvis was performed. No intravenous contrast was administered.  COMPARISON:  Multiple exams, including CT pelvis 03/19/2021  FINDINGS: Urinary Tract:  Unremarkable  Bowel: Sigmoid colon diverticulosis with some faint hazy  adjacent stranding/edema raising the possibility of low-grade diverticulitis for example on image 13 series 9. No appreciable abscess.  Vascular/Lymphatic: No pathologic pelvic adenopathy or specific vascular abnormality identified.  Reproductive:  No abnormality identified.  Other:  No supplemental non-categorized findings.  Musculoskeletal: Anchor tracks along the right greater trochanter from prior gluteus minimus and gluteus medius reattachment. Both gluteus minimus and gluteus medius muscles are substantially atrophic as shown on the CT from 03/19/2021, and there is also chronic left gluteus minimus atrophy. Mild irregularity of the distal gluteus minimus and medius tendons without frank detachment. Trace adjacent trochanteric bursitis on the right. No obvious acute injury in this vicinity.  Tendinopathy and partial tearing of the right proximal hamstring tendon on images 20-22 of series 9. Mild tendinopathy of the left proximal hamstring tendon.  Moderate spurring of both acetabula and both femoral heads, with upper normal amount of fluid in both hip joints. Unremarkable hip adductor musculature.  Arthropathy at the pubis with associated spurring and low-grade edema. No clear tear of the adductor aponeurosis.  Mild laxity of the left anterior abdominal wall laterally  Lower lumbar, sacral, and iliac bone fixation hardware noted. The iliac screws traverse the SI joints.  IMPRESSION: 1. 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 Postoperative  findings along the right greater trochanter from prior gluteus medius and gluteus minimus tendon reattachment. The distal tendons appear somewhat irregular but not ruptured. There is trace right trochanteric bursitis. 2. Sigmoid colon diverticulosis with faint adjacent edema/stranding raise the possibility of low-grade active diverticulitis. 3. Tendinopathy and partial tearing of the  right proximal hamstring tendon, likely chronic. Mild tendinopathy of the left proximal hamstring tendon. 4. 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 arthropathy of the pubis. 5. Mild laxity of the left anterior abdominal wall laterally along the upper pelvis. 6. Multilevel lumbar fusion with lumbosacral and iliac bone fixation.  Electronically Signed   By: Van Clines M.D.   On: 09/11/2021 07:55  Assessment  The primary encounter diagnosis was Chronic knee pain (4th area of Pain) (Posterior) (Left). Diagnoses of Left lateral knee pain, Baker's cyst of knee (Left), and Chronic pain syndrome were also pertinent to this visit.  Plan of Care  Problem-specific:  No problem-specific Assessment & Plan notes found for this encounter.  Ms. Correna Meacham has a current medication list which includes the following long-term medication(s): atorvastatin, fluticasone, levothyroxine, metoprolol succinate, telmisartan, and zolpidem.  Pharmacotherapy (Medications Ordered): No orders of the defined types were placed in this encounter.  Orders:  No orders of the defined types were placed in this encounter.  Follow-up plan:   Return if symptoms worsen or fail to improve.     Interventional Therapies  Risk  Complexity Considerations:   Estimated body mass index is 25.85 kg/m as calculated from the following:   Height as of this encounter: $RemoveBeforeD'4\' 11"'[id number]$  (1.499 m).   Weight as of this encounter: 128 lb (58.1 kg). WNL   Planned  Pending:      Under consideration:   Diagnostic left inferolateral and recurrent genicular NB #1  Therapeutic left inferolateral and recurrent genicular nerve RFA #1  Diagnostic right T9-10 thoracic ESI #1  Diagnostic bilateral T9, T10, T11, and T12 MMB thoracic facet RFA (DIFICULT CANDIDATE DUE TO HARDWARE)    Completed:   Diagnostic left genicular NB x1 (07/03/2021) (100/100/50/50)  Therapeutic left genicular nerve RFA x1 (08/09/2021)  (100/100/30/0)  Diagnostic bilateral cervical facet MBB x1 (10/26/2020) (100/100/0)  Diagnostic bilateral suprascapular NB x1 (10/05/2020) (100/100/100 x1 day/0)  Diagnostic right cervical ESI x1 (09/14/2020) (100/100/100/90-100)  Diagnostic/therapeutic bilateral T9-T12 thoracic facet MBB x2 (06/06/2020) (100/100/0)  Diagnostic/Therapeutic right trapezoid bursa injection x1 (11/09/2020) (did not follow-up)    Therapeutic  Palliative (PRN) options:   Therapeutic/palliative bilateral T9-12 thoracic facet MBB #3  Therapeutic/palliative right trapezoid bursa injection #2      Recent Visits Date Type Provider Dept  10/11/21 Office Visit Milinda Pointer, MD [facility]  08/09/21 Procedure visit Milinda Pointer, MD [facility]  Showing recent visits within past 90 days and meeting all other requirements Today's Visits Date Type Provider Dept  10/17/21 Office Visit Milinda Pointer, MD [facility]  Showing today's visits and meeting all other requirements Future Appointments No visits were found meeting these conditions. Showing future appointments within next 90 days and meeting all other requirements I discussed the assessment and treatment plan with the patient. The patient was provided an opportunity to ask questions and all were answered. The patient agreed with the plan and demonstrated an understanding of the instructions.  Patient advised to call back or seek an in-person evaluation if the symptoms or condition worsens.  Duration of encounter: 20 minutes.  Note by: Gaspar Cola, MD Date: 10/17/2021; Time: 12:59 PM

## 2021-10-19 DIAGNOSIS — M25551 Pain in right hip: Secondary | ICD-10-CM | POA: Diagnosis not present

## 2021-10-19 DIAGNOSIS — R262 Difficulty in walking, not elsewhere classified: Secondary | ICD-10-CM | POA: Diagnosis not present

## 2021-10-19 DIAGNOSIS — M25552 Pain in left hip: Secondary | ICD-10-CM | POA: Diagnosis not present

## 2021-10-22 DIAGNOSIS — M25551 Pain in right hip: Secondary | ICD-10-CM | POA: Diagnosis not present

## 2021-10-22 DIAGNOSIS — M25552 Pain in left hip: Secondary | ICD-10-CM | POA: Diagnosis not present

## 2021-10-22 DIAGNOSIS — R262 Difficulty in walking, not elsewhere classified: Secondary | ICD-10-CM | POA: Diagnosis not present

## 2021-10-24 DIAGNOSIS — M25551 Pain in right hip: Secondary | ICD-10-CM | POA: Diagnosis not present

## 2021-10-24 DIAGNOSIS — R262 Difficulty in walking, not elsewhere classified: Secondary | ICD-10-CM | POA: Diagnosis not present

## 2021-10-24 DIAGNOSIS — M25552 Pain in left hip: Secondary | ICD-10-CM | POA: Diagnosis not present

## 2021-10-25 DIAGNOSIS — H40013 Open angle with borderline findings, low risk, bilateral: Secondary | ICD-10-CM | POA: Diagnosis not present

## 2021-10-25 DIAGNOSIS — H26491 Other secondary cataract, right eye: Secondary | ICD-10-CM | POA: Diagnosis not present

## 2021-10-25 DIAGNOSIS — H52203 Unspecified astigmatism, bilateral: Secondary | ICD-10-CM | POA: Diagnosis not present

## 2021-10-25 DIAGNOSIS — Z961 Presence of intraocular lens: Secondary | ICD-10-CM | POA: Diagnosis not present

## 2021-10-26 DIAGNOSIS — R262 Difficulty in walking, not elsewhere classified: Secondary | ICD-10-CM | POA: Diagnosis not present

## 2021-10-26 DIAGNOSIS — M25552 Pain in left hip: Secondary | ICD-10-CM | POA: Diagnosis not present

## 2021-10-26 DIAGNOSIS — M25551 Pain in right hip: Secondary | ICD-10-CM | POA: Diagnosis not present

## 2021-10-30 DIAGNOSIS — M25552 Pain in left hip: Secondary | ICD-10-CM | POA: Diagnosis not present

## 2021-10-30 DIAGNOSIS — R262 Difficulty in walking, not elsewhere classified: Secondary | ICD-10-CM | POA: Diagnosis not present

## 2021-10-30 DIAGNOSIS — M25551 Pain in right hip: Secondary | ICD-10-CM | POA: Diagnosis not present

## 2021-11-01 DIAGNOSIS — M25552 Pain in left hip: Secondary | ICD-10-CM | POA: Diagnosis not present

## 2021-11-01 DIAGNOSIS — M25551 Pain in right hip: Secondary | ICD-10-CM | POA: Diagnosis not present

## 2021-11-01 DIAGNOSIS — R262 Difficulty in walking, not elsewhere classified: Secondary | ICD-10-CM | POA: Diagnosis not present

## 2021-11-02 ENCOUNTER — Ambulatory Visit: Payer: Medicare Other | Admitting: Nurse Practitioner

## 2021-11-06 ENCOUNTER — Telehealth: Payer: Self-pay

## 2021-11-06 DIAGNOSIS — M25552 Pain in left hip: Secondary | ICD-10-CM | POA: Diagnosis not present

## 2021-11-06 NOTE — Telephone Encounter (Signed)
° °  Pre-operative Risk Assessment    Patient Name: Maria Spencer  DOB: [birth year] MRN: [id number]      Request for Surgical Clearance    Procedure:  left total knee replacement  Date of Surgery: TBD                                 Surgeon:  Charlies Constable Surgeon's Group or Practice Name:  [facility] Phone number:  [phone] Fax number:  [phone]   Type of Clearance Requested:   - Medical    Type of Anesthesia:  Not Indicated   Additional requests/questions:    Signed, Pilar A Ham   11/06/2021, 3:21 PM

## 2021-11-07 NOTE — Telephone Encounter (Signed)
° °  Name: Maria Spencer  DOB: [birth year]  MRN: [id number]   Primary Cardiologist: [name], MD  Chart reviewed as part of pre-operative protocol coverage. Patient was contacted 11/07/2021 in reference to pre-operative risk assessment for pending surgery as outlined below.  Jazel Nimmons was last seen on 04/10/2021 by Laurann Montana NP.  Since that day, Zahava Quant has done well without exertional chest pain or worsening dyspnea.  Therefore, based on ACC/AHA guidelines, the patient would be at acceptable risk for the planned procedure without further cardiovascular testing.   The patient was advised that if she develops new symptoms prior to surgery to contact our office to arrange for a follow-up visit, and she verbalized understanding.  I will route this recommendation to the requesting party via Epic fax function and remove from pre-op pool. Please call with questions.  [place], [place] 11/07/2021, 8:54 AM

## 2021-11-08 DIAGNOSIS — H26491 Other secondary cataract, right eye: Secondary | ICD-10-CM | POA: Diagnosis not present

## 2021-11-29 DIAGNOSIS — M810 Age-related osteoporosis without current pathological fracture: Secondary | ICD-10-CM | POA: Diagnosis not present

## 2021-12-05 ENCOUNTER — Ambulatory Visit: Payer: Medicare Other | Admitting: Internal Medicine

## 2021-12-13 ENCOUNTER — Ambulatory Visit: Payer: Medicare Other | Admitting: Nurse Practitioner

## 2021-12-14 ENCOUNTER — Telehealth: Payer: Self-pay | Admitting: Internal Medicine

## 2021-12-14 NOTE — Telephone Encounter (Signed)
Maria Spencer from [facility] wants to know if we receive a fax for pre authorization for the pt

## 2021-12-16 ENCOUNTER — Other Ambulatory Visit: Payer: Self-pay | Admitting: Internal Medicine

## 2021-12-17 NOTE — Telephone Encounter (Signed)
James from [facility] called checking on prior authorization for patient's knee brace. Can reach Jeneen Rinks at [phone]

## 2021-12-18 NOTE — Telephone Encounter (Signed)
Maria Spencer for [facility] called again checking on paper work for knee brace.

## 2021-12-19 NOTE — Telephone Encounter (Signed)
Per Dr Aundra Dubin, fax sent back to [facility] on 12/19/2021 to cancel brace,she never ordered brace for patient.

## 2021-12-19 NOTE — Telephone Encounter (Signed)
James from [facility] called again in regards to knee brace order for patient. Spoke to [place] and was advised patient has not placed order for a knee brace. Jeneen Rinks was advised to cancel the order.

## 2022-01-01 ENCOUNTER — Telehealth: Payer: Self-pay | Admitting: Internal Medicine

## 2022-01-01 NOTE — Telephone Encounter (Signed)
Pt need refill on zolpidem (AMBIEN) 5 MG tablet, telmisartan (MICARDIS) 80 MG tablet, metoprolol succinate (TOPROL-XL) 50 MG 24 hr tablet, levothyroxine (SYNTHROID) 88 MCG tablet sent to [facility]

## 2022-01-04 ENCOUNTER — Other Ambulatory Visit: Payer: Self-pay | Admitting: Internal Medicine

## 2022-01-04 ENCOUNTER — Other Ambulatory Visit: Payer: Self-pay

## 2022-01-04 DIAGNOSIS — G4700 Insomnia, unspecified: Secondary | ICD-10-CM

## 2022-01-04 DIAGNOSIS — I10 Essential (primary) hypertension: Secondary | ICD-10-CM

## 2022-01-04 DIAGNOSIS — E039 Hypothyroidism, unspecified: Secondary | ICD-10-CM

## 2022-01-04 MED ORDER — TELMISARTAN 80 MG PO TABS: 80.0000 mg | ORAL_TABLET | Freq: Every day | ORAL | 3 refills | Status: DC

## 2022-01-04 MED ORDER — LEVOTHYROXINE SODIUM 88 MCG PO TABS: 88.0000 ug | ORAL_TABLET | Freq: Every day | ORAL | 3 refills | Status: DC

## 2022-01-04 MED ORDER — METOPROLOL SUCCINATE ER 50 MG PO TB24: 50.0000 mg | ORAL_TABLET | Freq: Every day | ORAL | 3 refills | Status: DC

## 2022-01-04 MED ORDER — ZOLPIDEM TARTRATE 5 MG PO TABS: 2.5000 mg | ORAL_TABLET | Freq: Every evening | ORAL | 1 refills | Status: DC | PRN

## 2022-01-08 DIAGNOSIS — M25561 Pain in right knee: Secondary | ICD-10-CM | POA: Diagnosis not present

## 2022-01-09 ENCOUNTER — Ambulatory Visit: Payer: Medicare Other | Admitting: Physician Assistant

## 2022-01-09 ENCOUNTER — Other Ambulatory Visit (HOSPITAL_COMMUNITY): Payer: Medicare Other

## 2022-01-10 ENCOUNTER — Telehealth: Payer: Self-pay | Admitting: Internal Medicine

## 2022-01-10 NOTE — Telephone Encounter (Signed)
Paula From [facility] called in stating she needs to verify direction on medication (telmisartan (MICARDIS) 80 MG tablet). Paula requesting callback at [phone]. Ref number is [id number]

## 2022-01-11 NOTE — Telephone Encounter (Signed)
Call Maria Spencer d/c losartan 100 from profile there is no need to take this if sent in micardis 80 mg daily

## 2022-01-11 NOTE — Telephone Encounter (Signed)
Signature for medication as below. Please clarify how Patient is to take medication:  Sig: Take 1 tablet (80 mg total) by mouth daily. In place of losartan 100. Take 1/2 pill day 3 days and if BP >130/>80 take 1 pill day 4 and beyond daily

## 2022-01-14 NOTE — Telephone Encounter (Signed)
Please advise questions was does Patient take the Micardis once daily or take 1/2 pill a day

## 2022-01-15 NOTE — Telephone Encounter (Signed)
Please call pt and confirm if taking micardis 80 mg daily or 1/2 pill and confirm with pharmacy as well after talking to pt

## 2022-01-16 ENCOUNTER — Ambulatory Visit: Admit: 2022-01-16 | Payer: Medicare Other | Admitting: Orthopedic Surgery

## 2022-01-18 ENCOUNTER — Other Ambulatory Visit: Payer: Self-pay | Admitting: Internal Medicine

## 2022-01-18 DIAGNOSIS — I10 Essential (primary) hypertension: Secondary | ICD-10-CM

## 2022-01-18 MED ORDER — TELMISARTAN 80 MG PO TABS: 80.0000 mg | ORAL_TABLET | Freq: Every day | ORAL | 3 refills | Status: DC

## 2022-01-18 NOTE — Telephone Encounter (Signed)
Patient is and has been taking Micardis 80 mg daily. Not taking half tablet. Confirmed with pharmacy.

## 2022-01-29 ENCOUNTER — Other Ambulatory Visit: Payer: Self-pay | Admitting: Internal Medicine

## 2022-01-29 DIAGNOSIS — I10 Essential (primary) hypertension: Secondary | ICD-10-CM

## 2022-02-05 DIAGNOSIS — Z20822 Contact with and (suspected) exposure to covid-19: Secondary | ICD-10-CM | POA: Diagnosis not present

## 2022-02-09 DIAGNOSIS — Z20822 Contact with and (suspected) exposure to covid-19: Secondary | ICD-10-CM | POA: Diagnosis not present

## 2022-02-12 ENCOUNTER — Encounter: Payer: Self-pay | Admitting: Internal Medicine

## 2022-02-12 ENCOUNTER — Other Ambulatory Visit: Payer: Self-pay

## 2022-02-12 ENCOUNTER — Ambulatory Visit
Admission: RE | Admit: 2022-02-12 | Discharge: 2022-02-12 | Disposition: A | Discharge status: Home / Self Care | Payer: Medicare Other | Source: Ambulatory Visit | Attending: Internal Medicine | Admitting: Internal Medicine

## 2022-02-12 ENCOUNTER — Ambulatory Visit (INDEPENDENT_AMBULATORY_CARE_PROVIDER_SITE_OTHER): Payer: Medicare Other | Admitting: Internal Medicine

## 2022-02-12 VITALS — BP 142/82 | HR 75 | Temp 98.1°F | Ht 59.0 in | Wt 135.6 lb

## 2022-02-12 DIAGNOSIS — R197 Diarrhea, unspecified: Secondary | ICD-10-CM

## 2022-02-12 DIAGNOSIS — K429 Umbilical hernia without obstruction or gangrene: Secondary | ICD-10-CM | POA: Diagnosis not present

## 2022-02-12 DIAGNOSIS — B370 Candidal stomatitis: Secondary | ICD-10-CM | POA: Diagnosis not present

## 2022-02-12 DIAGNOSIS — K30 Functional dyspepsia: Secondary | ICD-10-CM

## 2022-02-12 DIAGNOSIS — K5792 Diverticulitis of intestine, part unspecified, without perforation or abscess without bleeding: Secondary | ICD-10-CM | POA: Diagnosis not present

## 2022-02-12 DIAGNOSIS — K5732 Diverticulitis of large intestine without perforation or abscess without bleeding: Secondary | ICD-10-CM | POA: Diagnosis not present

## 2022-02-12 DIAGNOSIS — G4700 Insomnia, unspecified: Secondary | ICD-10-CM

## 2022-02-12 DIAGNOSIS — R1032 Left lower quadrant pain: Secondary | ICD-10-CM

## 2022-02-12 DIAGNOSIS — K6389 Other specified diseases of intestine: Secondary | ICD-10-CM | POA: Diagnosis not present

## 2022-02-12 LAB — CBC WITH DIFFERENTIAL/PLATELET
Basophils Absolute: 0 10*3/uL (ref 0.0–0.1)
Basophils Relative: 0.4 % (ref 0.0–3.0)
Eosinophils Absolute: 0.1 10*3/uL (ref 0.0–0.7)
Eosinophils Relative: 2 % (ref 0.0–5.0)
HCT: 39.2 % (ref 36.0–46.0)
Hemoglobin: 12.9 g/dL (ref 12.0–15.0)
Lymphocytes Relative: 14.6 % (ref 12.0–46.0)
Lymphs Abs: 0.8 10*3/uL (ref 0.7–4.0)
MCHC: 32.8 g/dL (ref 30.0–36.0)
MCV: 87.8 fl (ref 78.0–100.0)
Monocytes Absolute: 0.4 10*3/uL (ref 0.1–1.0)
Monocytes Relative: 6.2 % (ref 3.0–12.0)
Neutro Abs: 4.5 10*3/uL (ref 1.4–7.7)
Neutrophils Relative %: 76.8 % (ref 43.0–77.0)
Platelets: 224 10*3/uL (ref 150.0–400.0)
RBC: 4.46 Mil/uL (ref 3.87–5.11)
RDW: 14.4 % (ref 11.5–15.5)
WBC: 5.8 10*3/uL (ref 4.0–10.5)

## 2022-02-12 LAB — COMPREHENSIVE METABOLIC PANEL
ALT: 12 U/L (ref 0–35)
AST: 17 U/L (ref 0–37)
Albumin: 4.1 g/dL (ref 3.5–5.2)
Alkaline Phosphatase: 84 U/L (ref 39–117)
BUN: 14 mg/dL (ref 6–23)
CO2: 31 mEq/L (ref 19–32)
Calcium: 9.2 mg/dL (ref 8.4–10.5)
Chloride: 102 mEq/L (ref 96–112)
Creatinine, Ser: 0.53 mg/dL (ref 0.40–1.20)
GFR: 90.6 mL/min (ref >60.00)
Glucose, Bld: 93 mg/dL (ref 70–99)
Potassium: 4 mEq/L (ref 3.5–5.1)
Sodium: 139 mEq/L (ref 135–145)
Total Bilirubin: 0.5 mg/dL (ref 0.2–1.2)
Total Protein: 6.5 g/dL (ref 6.0–8.3)

## 2022-02-12 LAB — POCT I-STAT CREATININE: Creatinine, Ser: 0.6 mg/dL (ref 0.44–1.00)

## 2022-02-12 IMAGING — CT CT ABD-PELV W/ CM
2 of 5 series · 16 of 46 positions shown, 18 images · IV contrast (agent unspecified)
Comparison: CT abdomen pelvis dated [DATE].

CLINICAL DATA: Left lower quadrant abdominal pain with cramping and
diarrhea for the past few weeks. History of diverticulitis.

EXAM:
CT ABDOMEN AND PELVIS WITH CONTRAST
TECHNIQUE: Multidetector CT imaging of the abdomen and pelvis was performed
using the standard protocol following bolus administration of
intravenous contrast.

[Series 2: axial (person_name) · axial · 0.81mm/px · z∈[+218,+613]mm · 13 of 89 slices shown, 15 images]
[im 5/89  soft-tissue]
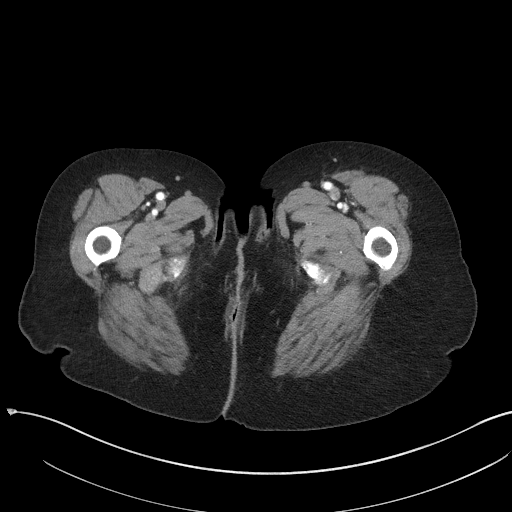
[im 5/89  bone]
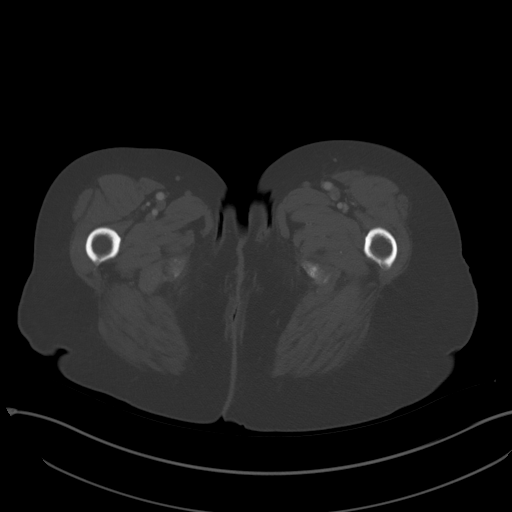
[im 14/89  soft-tissue]
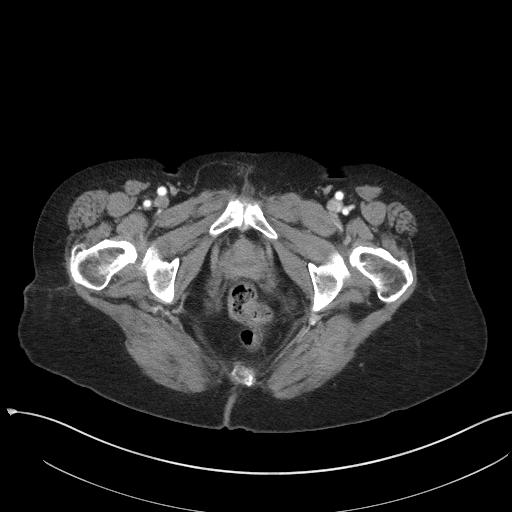
[im 19/89  soft-tissue]
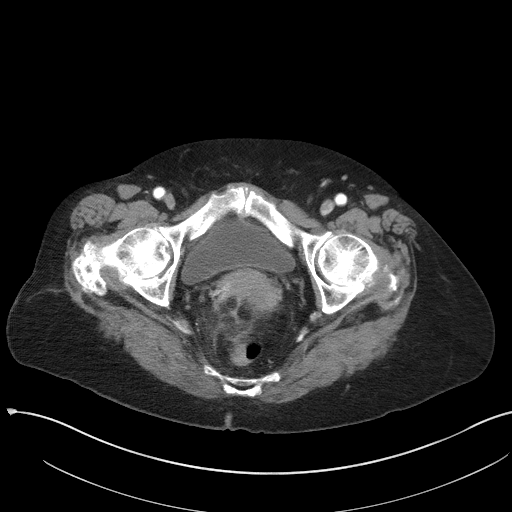
[im 24/89  soft-tissue]
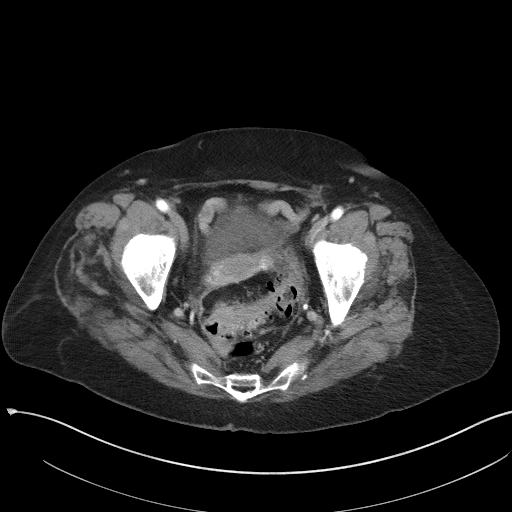
[im 33/89  soft-tissue]
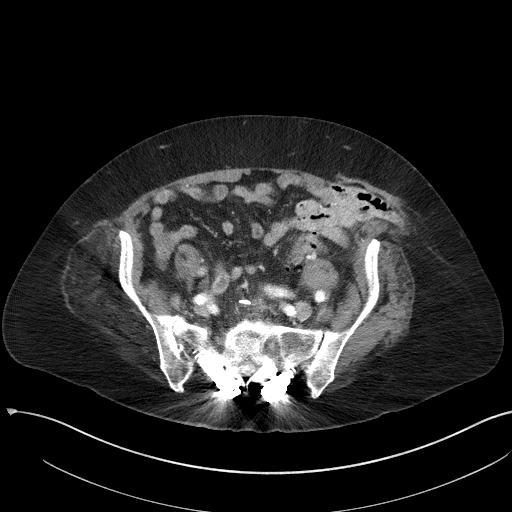
[im 38/89  soft-tissue]
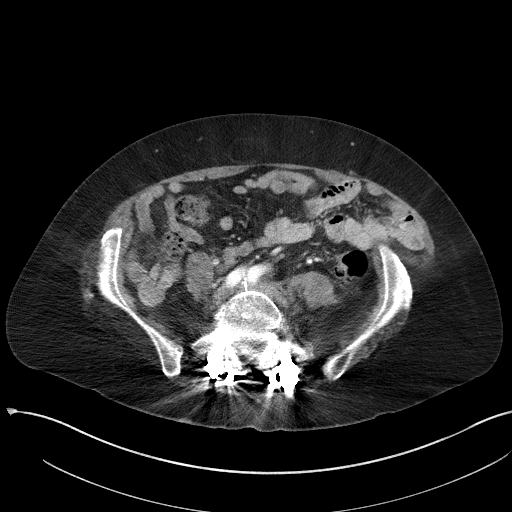
[im 47/89  soft-tissue]
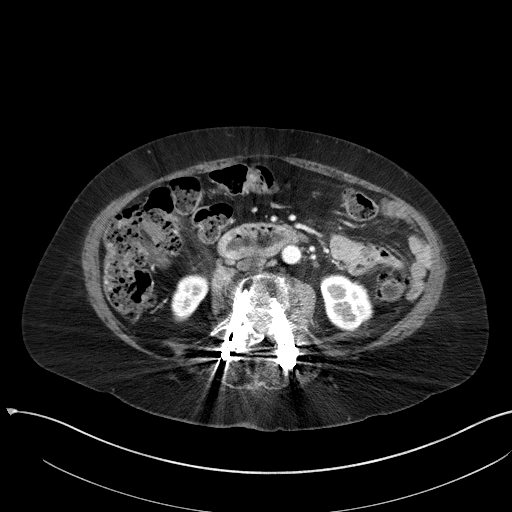
[im 51/89  soft-tissue]
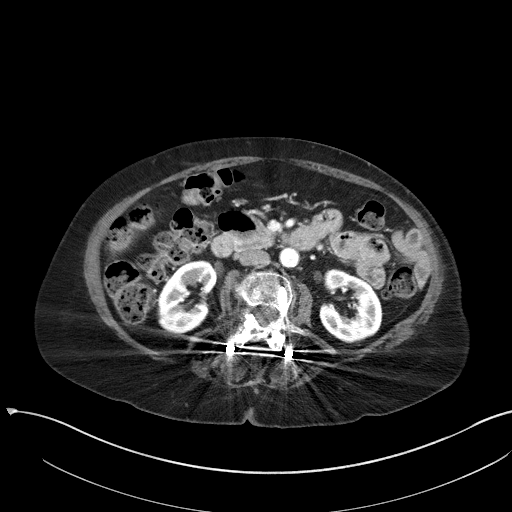
[im 56/89  soft-tissue]
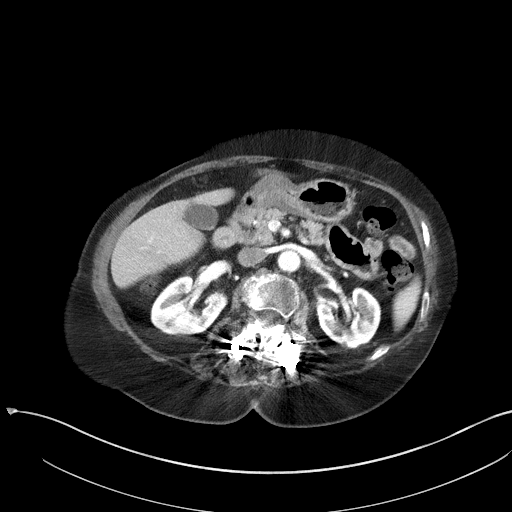
[im 56/89  bone]
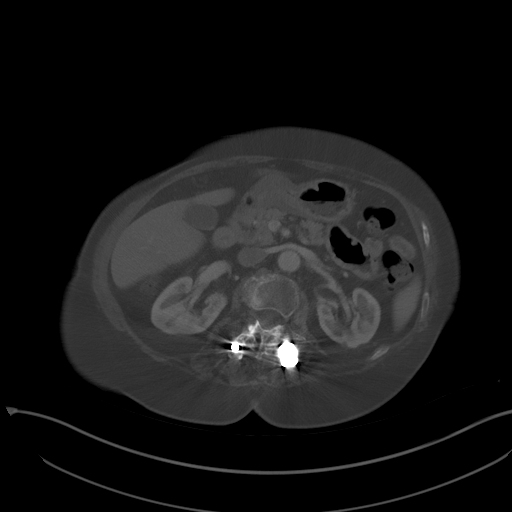
[im 65/89  soft-tissue]
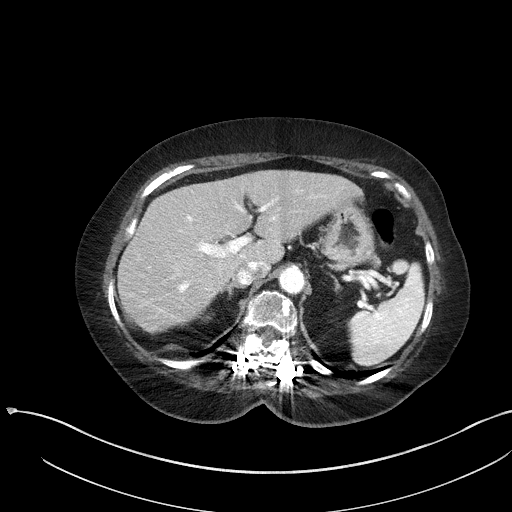
[im 70/89  soft-tissue]
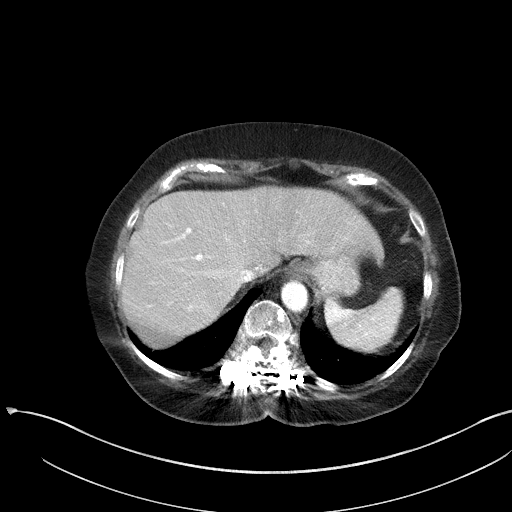
[im 75/89  soft-tissue]
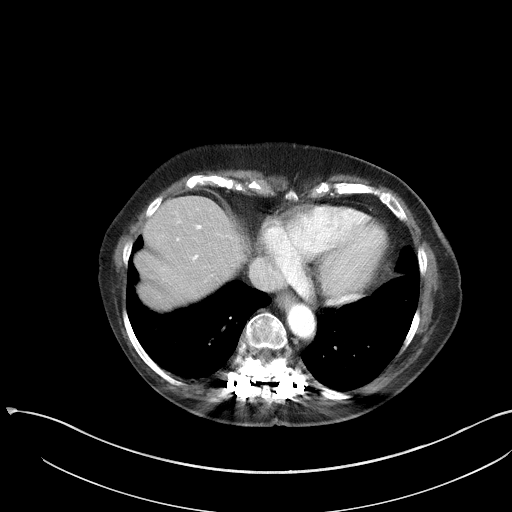
[im 84/89  soft-tissue]
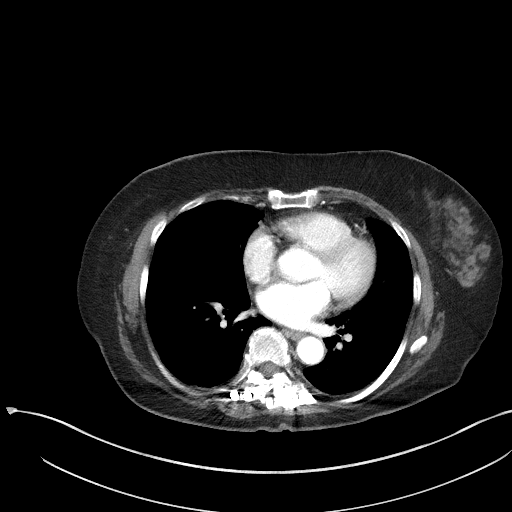

[Series 5: coronal (person_name) · coronal · 0.80mm/px · 3 of 88 slices shown]
[im 30/88  soft-tissue]
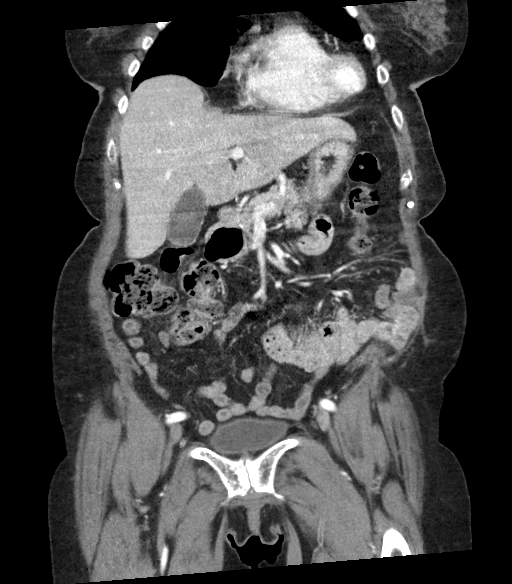
[im 39/88  soft-tissue]
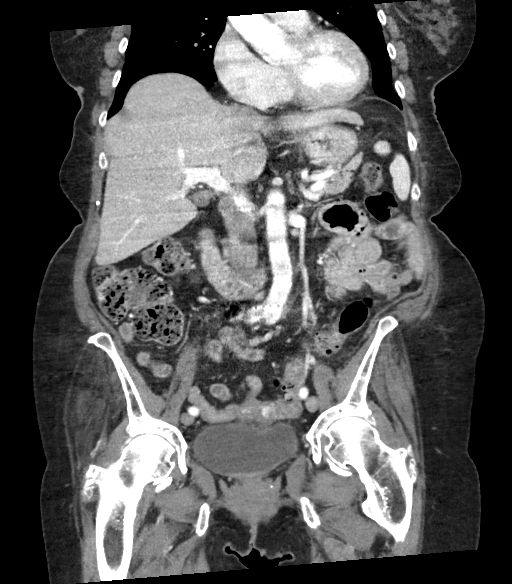
[im 49/88  soft-tissue]
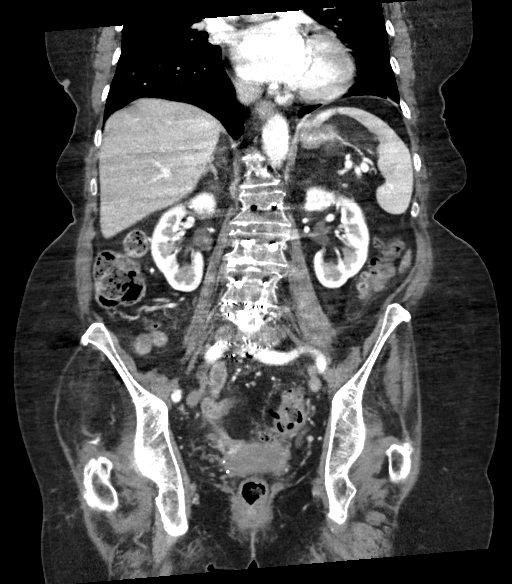

[16 of 46 positions shown; findings below may reference images not displayed]

RADIATION DOSE REDUCTION: This exam was performed according to the
departmental dose-optimization program which includes automated
exposure control, adjustment of the mA and/or kV according to
patient size and/or use of iterative reconstruction technique.

CONTRAST:  100mL OMNIPAQUE IOHEXOL 300 MG/ML  SOLN
FINDINGS: Lower chest: No acute abnormality. Unchanged small right fat
containing Bochdalek hernia.

Hepatobiliary: Unchanged subcentimeter cyst in the superior left
liver. No other focal liver abnormality. The gallbladder is
unremarkable. No biliary dilatation.

Pancreas: Unremarkable. No pancreatic ductal dilatation or
surrounding inflammatory changes.

Spleen: Normal in size without focal abnormality.

Adrenals/Urinary Tract: Adrenal glands are unremarkable. Kidneys are
normal, without renal calculi, focal lesion, or hydronephrosis.
Bladder is unremarkable.

Stomach/Bowel: The stomach is within normal limits. Small bowel is
unremarkable. Extensive left-sided colonic diverticulosis again
noted with severe wall thickening of the distal sigmoid colon and
mild surrounding inflammatory change. Normal appendix.

Vascular/Lymphatic: Aortic atherosclerosis. No enlarged abdominal or
pelvic lymph nodes.

Reproductive: Uterus and bilateral adnexa are unremarkable.

Other: Unchanged fat containing umbilical and infraumbilical
hernias. Similar laxity of the lower left abdominal wall. No fluid
collection or pneumoperitoneum.

Musculoskeletal: No acute or significant osseous findings. Extensive
thoracolumbar fusion to the pelvis again noted.
IMPRESSION: 1. Acute sigmoid diverticulitis. No perforation or abscess.
2. Aortic Atherosclerosis ([N9]-[N9]).

## 2022-02-12 MED ORDER — FLUCONAZOLE 150 MG PO TABS: 150.0000 mg | ORAL_TABLET | Freq: Once | ORAL | 1 refills | Status: AC

## 2022-02-12 MED ORDER — HYDROCODONE-ACETAMINOPHEN 5-325 MG PO TABS: 1.0000 | ORAL_TABLET | Freq: Two times a day (BID) | ORAL | 0 refills | Status: DC | PRN

## 2022-02-12 MED ORDER — AMOXICILLIN-POT CLAVULANATE 875-125 MG PO TABS: 1.0000 | ORAL_TABLET | Freq: Two times a day (BID) | ORAL | 0 refills | Status: DC

## 2022-02-12 MED ORDER — IOHEXOL 300 MG/ML  SOLN
100.0000 mL | Freq: Once | INTRAMUSCULAR | Status: AC | PRN
  Administered 2022-02-12: 100 mL via INTRAVENOUS

## 2022-02-12 NOTE — Progress Notes (Signed)
Chief Complaint  Patient presents with   GI Problem    Stomach pain, cramp[ing, diarrhea, and gas ongoing for a few weeks. Patient states she had some left over amoxicillin and took this for 3.5 days, finished this yesterday. Still having symptoms.  Has history of diverticulitis.    Diarrhea   F/u  1. LLQ Ab pain (h/o diverticulitis) and cramping and diarrhea and gas x a few weeks changed diet to see if it would help less red meat but did not ab pain 6/10 llq worsening she found augmentin on Friday and has taken it x 3.5 days tried otc probiotics, gas x Last egg was at 8:30 am this am but overall appetite reduced. With diarrhea tried immodium otc it hurts her abdomen when she has a bowel movement in LLQ. No blood in stool. Temp this am 99.2 at home nl she is 97.2 F She feels getting thrush in her mouth   She has upcoming knee replacement 03/19/22   Review of Systems  Constitutional:  Negative for weight loss.  HENT:  Negative for hearing loss.   Eyes:  Negative for blurred vision.  Respiratory:  Negative for shortness of breath.   Cardiovascular:  Negative for chest pain.  Gastrointestinal:  Positive for abdominal pain and diarrhea. Negative for blood in stool.  Genitourinary:  Negative for dysuria.  Musculoskeletal:  Negative for falls and joint pain.  Skin:  Negative for rash.  Neurological:  Negative for headaches.  Psychiatric/Behavioral:  Negative for depression.   Past Medical History:  Diagnosis Date   Arthritis    neck, back, left knee;    Breast cancer (HCC) 2009   right lumpectomy s/p radiation x 1 week 2x per day only    Chicken pox    Chronic UTI    established with urology    COVID-19    ? 12/2020 sob  took antiviral, 08/15/21   Diverticular disease    -osis and -itis    Esophagitis    egd 05/17/14 see report scanned into chart   History of kidney stones    Hormone disorder    Hyperlipidemia    Hypertension    controlled well with medication;    Hypothyroidism     Osteoporosis    Personal history of radiation therapy 2009   F/U right breast cancer   Thyroid disease    hypothyroidism    Past Surgical History:  Procedure Laterality Date   BONE GRAFT HIP ILIAC CREST     + cage left hip 10/23/17    BREAST BIOPSY Left 2009   clip,benign   BREAST BIOPSY Right 2009   +   BREAST LUMPECTOMY Right 2009   2009 lumpectomy    CARDIAC CATHETERIZATION     No stents; [facility] doesn't recall facility +53yrs ago   CATARACT EXTRACTION, BILATERAL Bilateral 10/2019   COLONOSCOPY WITH PROPOFOL N/A 05/15/2020   Procedure: COLONOSCOPY WITH PROPOFOL;  Surgeon: Toney Reil, MD;  Location: [facility];  Service: Gastroenterology;  Laterality: N/A;   EYE SURGERY     b/l cataracts sch 10/2019 in [place]   HERNIA REPAIR     INSERTION OF MESH N/A 05/29/2018   Procedure: INSERTION OF MESH;  Surgeon: Luretha Murphy, MD;  Location: [facility];  Service: General;  Laterality: N/A;   JOINT REPLACEMENT     QUADRICEPS TENDON REPAIR Right 12/05/2020   Procedure: OPEN REPAIR OF RIGHT GLUTEUS MINIMUS TENDON;  Surgeon: Christena Flake, MD;  Location: [facility];  Service:  Orthopedics;  Laterality: Right;   right gluteus medius/minimus repair Dr. Joice Lofts 11/2020      Dr. Joice Lofts   SHOULDER SURGERY Left 2008, 2009   x2 rotator cuff surgeries left shoulders   SPINAL FUSION  07/24/2020   SPINE SURGERY  10/23/2017   spinal 09/2017 h/o scoliosis [facility] L4-S1 OLIF T10 to ililum fusion   TONSILLECTOMY     age 69 y.o.    TOTAL SHOULDER REPLACEMENT Right 2012   TUBAL LIGATION     VENTRAL HERNIA REPAIR N/A 05/29/2018   Procedure: LAPAROSCOPIC VENTRAL / LATERAL HERNIA REPAIR;  Surgeon: Luretha Murphy, MD;  Location: [facility];  Service: General;  Laterality: N/A;   Family History  Problem Relation Age of Onset   Arthritis Mother    Heart disease Mother 85       stent    Hyperlipidemia Mother    Hypertension Mother    Coronary artery disease Mother 51   Arthritis Father    Diabetes  Father    Cancer Father        colon   AAA (abdominal aortic aneurysm) Father    Arthritis Sister    Hyperlipidemia Sister    Hypertension Sister    Cancer Brother        lung, smoker   Mental retardation Brother    Drug abuse Daughter        overdose in 2018    Arthritis Sister    Hyperlipidemia Sister    Bladder Cancer Neg Hx    Kidney cancer Neg Hx    Breast cancer Neg Hx    Social History   Socioeconomic History   Marital status: Widowed    Spouse name: Not on file   Number of children: 2   Years of education: Not on file   Highest education level: Not on file  Occupational History   Not on file  Tobacco Use   Smoking status: Never   Smokeless tobacco: Never  Vaping Use   Vaping Use: Never used  Substance and Sexual Activity   Alcohol use: Not Currently    Alcohol/week: 7.0 standard drinks    Types: 7 Glasses of wine per week   Drug use: No   Sexual activity: Yes    Birth control/protection: Post-menopausal  Other Topics Concern   Not on file  Social History Narrative   College grad   widowed husband died July 14, 2019    Wears seatbelt and safe in relationship    3 kids : 1 daughter passed      [employer]   Social Determinants of Health   Financial Resource Strain: Low Risk    Difficulty of Paying Living Expenses: Not hard at all  Food Insecurity: No Food Insecurity   Worried About Programme researcher, broadcasting/film/video in the Last Year: Never true   Barista in the Last Year: Never true  Transportation Needs: No Transportation Needs   Lack of Transportation (Medical): No   Lack of Transportation (Non-Medical): No  Physical Activity: Insufficiently Active   Days of Exercise per Week: 2 days   Minutes of Exercise per Session: 60 min  Stress: Stress Concern Present   Feeling of Stress : To some extent  Social Connections: Unknown   Frequency of Communication with Friends and Family: More than three times a week   Frequency of Social Gatherings with  Friends and Family: Not on file   Attends Religious Services: Not on file   Active Member of  Clubs or Organizations: Yes   Attends Banker Meetings: More than 4 times per year   Marital Status: Widowed  Catering manager Violence: Not At Risk   Fear of Current or Ex-Partner: No   Emotionally Abused: No   Physically Abused: No   Sexually Abused: No   Current Meds  Medication Sig   amoxicillin-clavulanate (AUGMENTIN) 875-125 MG tablet Take 1 tablet by mouth 2 (two) times daily. X 10 days with food   atorvastatin (LIPITOR) 20 MG tablet TAKE 1 TABLET EVERY OTHER  DAY (DISCONTINUE LIPITOR   10MG . INCREASED DOSE)   fluconazole (DIFLUCAN) 150 MG tablet Take 1 tablet (150 mg total) by mouth once for 1 dose. Repeat dose in 3 days if needed   fluticasone (FLONASE) 50 MCG/ACT nasal spray Place 2 sprays into both nostrils daily. Max b/l nostrils   HYDROcodone-acetaminophen (NORCO) 5-325 MG tablet Take 1 tablet by mouth 2 (two) times daily as needed for moderate pain.   levothyroxine (SYNTHROID) 88 MCG tablet Take 1 tablet (88 mcg total) by mouth daily before breakfast. 30 minutes   metoprolol succinate (TOPROL-XL) 50 MG 24 hr tablet Take 1 tablet (50 mg total) by mouth daily. Take with or immediately following a meal.   Probiotic Product (PROBIOTIC PO) Take by mouth daily.   telmisartan (MICARDIS) 80 MG tablet TAKE 1/2 TABLET DAILY FOR 3 DAYS & IF BP >130/>80 START 1 TABLET DAILY THEREAFTER   vitamin B-12 (CYANOCOBALAMIN) 1000 MCG tablet Take 1,000 mcg by mouth daily.   zolpidem (AMBIEN) 5 MG tablet Take 0.5 tablets (2.5 mg total) by mouth at bedtime as needed.   Allergies  Allergen Reactions   Sulfa Antibiotics Anaphylaxis, Hives, Itching, Shortness Of Breath and Swelling    Lips & throat swelled   Nitrofuran Derivatives Swelling    Lip swelling; "eyes get red and puffy"   Amlodipine Besylate Other (See Comments)    Gas, bloating   Gas, bloating  Heart racing    No results found  for this or any previous visit (from the past 2160 hour(s)). Objective  Body mass index is 27.39 kg/m. Wt Readings from Last 3 Encounters:  02/12/22 135 lb 9.6 oz (61.5 kg)  10/11/21 128 lb (58.1 kg)  10/09/21 130 lb (59 kg)   Temp Readings from Last 3 Encounters:  02/12/22 98.1 F (36.7 C) (Temporal)  10/11/21 (!) 97.1 F (36.2 C) (Temporal)  09/25/21 98.3 F (36.8 C) (Oral)   BP Readings from Last 3 Encounters:  02/12/22 (!) 142/82  10/11/21 140/75  09/25/21 128/84   Pulse Readings from Last 3 Encounters:  02/12/22 75  10/11/21 85  09/25/21 88    Physical Exam Vitals and nursing note reviewed.  Constitutional:      Appearance: Normal appearance. She is well-developed and well-groomed.  HENT:     Head: Normocephalic and atraumatic.  Eyes:     Conjunctiva/sclera: Conjunctivae normal.     Pupils: Pupils are equal, round, and reactive to light.  Cardiovascular:     Rate and Rhythm: Normal rate and regular rhythm.     Heart sounds: Normal heart sounds. No murmur heard. Pulmonary:     Effort: Pulmonary effort is normal.     Breath sounds: Normal breath sounds.  Abdominal:     General: Abdomen is flat. Bowel sounds are normal.     Tenderness: There is abdominal tenderness in the left lower quadrant. There is no guarding or rebound.     Comments: +moderate LLQ ab pain  Musculoskeletal:        General: No tenderness.  Skin:    General: Skin is warm and dry.  Neurological:     General: No focal deficit present.     Mental Status: She is alert and oriented to person, place, and time. Mental status is at baseline.     Cranial Nerves: Cranial nerves 2-12 are intact.     Motor: Motor function is intact.     Coordination: Coordination is intact.     Gait: Gait is intact.  Psychiatric:        Attention and Perception: Attention and perception normal.        Mood and Affect: Mood and affect normal.        Speech: Speech normal.        Behavior: Behavior normal. Behavior  is cooperative.        Thought Content: Thought content normal.        Cognition and Memory: Cognition and memory normal.        Judgment: Judgment normal.    Assessment  Plan  Left lower quadrant abdominal pain r/o C diff h/o diverticulitis  r/o diverticulitis with hx- Plan: Comprehensive metabolic panel, CBC w/Diff, Urinalysis, Routine w reflex microscopic, Urine Culture, CT Abdomen Pelvis W Contrast, amoxicillin-clavulanate (AUGMENTIN) 875-125 MG tablet, HYDROcodone-acetaminophen (NORCO) 5-325 MG tablet F/u Gi Dr. Wyline Mood  Pt may need surgical consult  Diverticulitis - Plan: CT Abdomen Pelvis W Contrast, amoxicillin-clavulanate (AUGMENTIN) 875-125 MG tablet, HYDROcodone-acetaminophen (NORCO) 5-325 MG tablet  Oral thrush - Plan: fluconazole (DIFLUCAN) 150 MG tablet  Indigestion tried gas X and otc probiotics  Diarrhea, unspecified type - Plan: C Difficile Quick Screen w PCR reflex  Provider: Dr. French Ana McLean-Scocuzza-[facility] Medicine

## 2022-02-12 NOTE — Progress Notes (Signed)
Maria Spencer can do a video visit tomorrow at 11 am if patient wishes

## 2022-02-13 ENCOUNTER — Encounter: Payer: Self-pay | Admitting: Gastroenterology

## 2022-02-13 ENCOUNTER — Telehealth: Payer: Self-pay

## 2022-02-13 ENCOUNTER — Telehealth (INDEPENDENT_AMBULATORY_CARE_PROVIDER_SITE_OTHER): Payer: Medicare Other | Admitting: Gastroenterology

## 2022-02-13 DIAGNOSIS — K5733 Diverticulitis of large intestine without perforation or abscess with bleeding: Secondary | ICD-10-CM | POA: Diagnosis not present

## 2022-02-13 LAB — URINE CULTURE
MICRO NUMBER:: 13158873
Result:: NO GROWTH
SPECIMEN QUALITY:: ADEQUATE

## 2022-02-13 LAB — URINALYSIS, ROUTINE W REFLEX MICROSCOPIC
Bilirubin Urine: NEGATIVE
Glucose, UA: NEGATIVE
Hgb urine dipstick: NEGATIVE
Ketones, ur: NEGATIVE
Leukocytes,Ua: NEGATIVE
Nitrite: NEGATIVE
Protein, ur: NEGATIVE
Specific Gravity, Urine: 1.012 (ref 1.001–1.035)
pH: 6 (ref 5.0–8.0)

## 2022-02-13 NOTE — Telephone Encounter (Signed)
-----   Message from [name], MD sent at 02/12/2022  1:39 PM EDT ----- ?Herb Grays can do a video visit tomorrow at 11 am if patient wishes  ?

## 2022-02-13 NOTE — Telephone Encounter (Signed)
Called and got patient schedule today at 11:15 ?

## 2022-02-13 NOTE — Progress Notes (Signed)
?  ?[name] , MD ?[address]  ?[address]  ?[place], [place] [address]  ?Main: [phone]  ?Fax: [phone] ? ? ?Primary Care Physician: McLean-Scocuzza, Nino Glow, MD ? ?Virtual Visit via Video Note ? ?I connected with patient on 02/13/22 at 11:15 AM EDT by video and verified that I am speaking with the correct person using two identifiers. ?  ?I discussed the limitations, risks, security and privacy concerns of performing an evaluation and management service by video  and the availability of in person appointments. I also discussed with the patient that there may be a patient responsible charge related to this service. The patient expressed understanding and agreed to proceed. ? ?Location of Patient: Home ?Location of Provider: Home ?Persons involved: Patient and provider only ? ? ?History of Present Illness: ?Chief Complaint  ?Patient presents with  ? Diverticulitis  ?  Discuss CT scan   ? ? ?HPI: Maria Spencer is a 76 y.o. female ? ?Summary of history : ?  ?She is a patient who has seen Dr. Marius Ditch last about a year back for chronic left lower quadrant pain.  Has a diagnosis of irritable bowel syndrome.  Prior history of diverticulitis in March 2021, esophagitis in 2015 at that time she was treated with ciprofloxacin and Flagyl for 2 weeks to treat the diverticulitis.  And she underwent a colonoscopy in June 2021 4 mm polyp was resected in the transverse colon.  Hemorrhoids were noted and sigmoid diverticulosis was noted.  The polyp was a tubular adenoma.  She was seen again in December 2021 developed some constipation and was commenced on MiraLAX ?  ?02/06/2021 hemoglobin 12.8 g normal TSH.  B12 was 260 and folate was normal.. ?  ?Commenced on antibiotics in March 2022 for diverticulitis, April 2022 another episode sigmoid diverticulitis. Had a CT scan of the abdomen which showed sigmoid colonic diverticulitis with small abscesses.  Was treated conservatively with antibiotics.  And repeat CT scan  showed improvement.  Surgeon suggested possible outpatient elective sigmoid colectomy.  Given Augmentin at home for total of 14 days.  Since colonoscopy was done within the past 1 year there is no indication to repeat a further colonoscopy ?  ?Interval history  03/26/2021-02/13/2022 ? ?She states that she developed abdominal pain about 2 weeks back.  Initially started to disappear on its own.  Tried a low fiber diet and a liquid diet.  She had cramping along with diarrhea for which she took her Imodium which stopped the diarrhea but the abdominal pain got worse.  She then took some antibiotics which she had leftover at home.  Did not get better.  Then started on Augmentin a few days back.  Presently still has crampy abdominal pain but not any worse.  Denies any fever.  No diarrhea presently.  Was very comfortable during the conversation we had today. ? ? 02/12/2022: Ct abdomen : acute sigmoid diverticulitis ?Marland Kitchen ?Current Outpatient Medications  ?Medication Sig Dispense Refill  ? amoxicillin-clavulanate (AUGMENTIN) 875-125 MG tablet Take 1 tablet by mouth 2 (two) times daily. X 10 days with food 20 tablet 0  ? atorvastatin (LIPITOR) 20 MG tablet TAKE 1 TABLET EVERY OTHER  DAY (DISCONTINUE LIPITOR   '10MG'$ . INCREASED DOSE) 45 tablet 3  ? fluticasone (FLONASE) 50 MCG/ACT nasal spray Place 2 sprays into both nostrils daily. Max b/l nostrils 48 g 4  ? HYDROcodone-acetaminophen (NORCO) 5-325 MG tablet Take 1 tablet by mouth 2 (two) times daily as needed for moderate pain. [place]  tablet 0  ? levothyroxine (SYNTHROID) 88 MCG tablet Take 1 tablet (88 mcg total) by mouth daily before breakfast. 30 minutes 90 tablet 3  ? metoprolol succinate (TOPROL-XL) 50 MG 24 hr tablet Take 1 tablet (50 mg total) by mouth daily. Take with or immediately following a meal. 90 tablet 3  ? Probiotic Product (PROBIOTIC PO) Take by mouth daily.    ? telmisartan (MICARDIS) 80 MG tablet TAKE 1/2 TABLET DAILY FOR 3 DAYS & IF BP >130/>80 START 1 TABLET DAILY  THEREAFTER 90 tablet 3  ? vitamin B-12 (CYANOCOBALAMIN) 1000 MCG tablet Take 1,000 mcg by mouth daily.    ? zolpidem (AMBIEN) 5 MG tablet Take 0.5 tablets (2.5 mg total) by mouth at bedtime as needed. 90 tablet 1  ? Calcium Carb-Cholecalciferol ([employer] CALCIUM 600 & VITAMIN D3 PO) Take 1 tablet by mouth daily. (Patient not taking: Reported on 02/12/2022)    ? Multiple Vitamin (MULTI-VITAMINS) TABS Take 1 tablet by mouth daily.  (Patient not taking: Reported on 02/12/2022)    ? ?No current facility-administered medications for this visit.  ? ? ?Allergies as of 02/13/2022 - Review Complete 02/13/2022  ?Allergen Reaction Noted  ? Sulfa antibiotics Anaphylaxis, Hives, Itching, Shortness Of Breath, and Swelling 06/13/2015  ? Nitrofuran derivatives Swelling 01/05/2020  ? Amlodipine besylate Other (See Comments) 09/10/2019  ? ? ?Review of Systems:    ?All systems reviewed and negative except where noted in HPI.  ?General Appearance:    Alert, cooperative, no distress, appears stated age  ?Head:    Normocephalic, without obvious abnormality, atraumatic  ?Eyes:    PERRL, conjunctiva/corneas clear,  ?Ears:    Grossly normal hearing   ? ?Neurologic:  Grossly normal   ? ?Observations/Objective: ? ?Labs: ?CMP  ?   ?Component Value Date/Time  ? NA 139 02/12/2022 1143  ? K 4.0 02/12/2022 1143  ? CL 102 02/12/2022 1143  ? CO2 31 02/12/2022 1143  ? GLUCOSE 93 02/12/2022 1143  ? BUN 14 02/12/2022 1143  ? CREATININE 0.60 02/12/2022 1257  ? CREATININE 0.57 (L) 09/10/2019 1456  ? CALCIUM 9.2 02/12/2022 1143  ? PROT 6.5 02/12/2022 1143  ? ALBUMIN 4.1 02/12/2022 1143  ? AST 17 02/12/2022 1143  ? ALT 12 02/12/2022 1143  ? ALKPHOS 84 02/12/2022 1143  ? BILITOT 0.5 02/12/2022 1143  ? GFRNONAA >60 03/19/2021 0505  ? GFRAA >60 05/08/2020 1143  ? ?Lab Results  ?Component Value Date  ? WBC 5.8 02/12/2022  ? HGB 12.9 02/12/2022  ? HCT 39.2 02/12/2022  ? MCV 87.8 02/12/2022  ? PLT 224.0 02/12/2022  ? ? ?Imaging Studies: ?CT Abdomen Pelvis W  Contrast ? ?Result Date: 02/12/2022 ?CLINICAL DATA:  Left lower quadrant abdominal pain with cramping and diarrhea for the past few weeks. History of diverticulitis. EXAM: CT ABDOMEN AND PELVIS WITH CONTRAST TECHNIQUE: Multidetector CT imaging of the abdomen and pelvis was performed using the standard protocol following bolus administration of intravenous contrast. RADIATION DOSE REDUCTION: This exam was performed according to the departmental dose-optimization program which includes automated exposure control, adjustment of the mA and/or kV according to patient size and/or use of iterative reconstruction technique. CONTRAST:  142m OMNIPAQUE IOHEXOL 300 MG/ML  SOLN COMPARISON:  CT abdomen pelvis dated March 19, 2021. FINDINGS: Lower chest: No acute abnormality. Unchanged small right fat containing Bochdalek hernia. Hepatobiliary: Unchanged subcentimeter cyst in the superior left liver. No other focal liver abnormality. The gallbladder is unremarkable. No biliary dilatation. Pancreas: Unremarkable. No pancreatic ductal dilatation or surrounding  inflammatory changes. Spleen: Normal in size without focal abnormality. Adrenals/Urinary Tract: Adrenal glands are unremarkable. Kidneys are normal, without renal calculi, focal lesion, or hydronephrosis. Bladder is unremarkable. Stomach/Bowel: The stomach is within normal limits. Small bowel is unremarkable. Extensive left-sided colonic diverticulosis again noted with severe wall thickening of the distal sigmoid colon and mild surrounding inflammatory change. Normal appendix. Vascular/Lymphatic: Aortic atherosclerosis. No enlarged abdominal or pelvic lymph nodes. Reproductive: Uterus and bilateral adnexa are unremarkable. Other: Unchanged fat containing umbilical and infraumbilical hernias. Similar laxity of the lower left abdominal wall. No fluid collection or pneumoperitoneum. Musculoskeletal: No acute or significant osseous findings. Extensive thoracolumbar fusion to the  pelvis again noted. IMPRESSION: 1. Acute sigmoid diverticulitis. No perforation or abscess. 2. Aortic Atherosclerosis (ICD10-I70.0). Electronically Signed   By: Titus Dubin M.D.   On: 02/12/2022 13:18   ? ?Assessment

## 2022-02-15 DIAGNOSIS — Z20822 Contact with and (suspected) exposure to covid-19: Secondary | ICD-10-CM | POA: Diagnosis not present

## 2022-02-22 DIAGNOSIS — Z20822 Contact with and (suspected) exposure to covid-19: Secondary | ICD-10-CM | POA: Diagnosis not present

## 2022-02-24 DIAGNOSIS — Z20822 Contact with and (suspected) exposure to covid-19: Secondary | ICD-10-CM | POA: Diagnosis not present

## 2022-02-25 DIAGNOSIS — M25562 Pain in left knee: Secondary | ICD-10-CM | POA: Diagnosis not present

## 2022-02-26 ENCOUNTER — Ambulatory Visit: Payer: Self-pay | Admitting: Physician Assistant

## 2022-02-26 DIAGNOSIS — N390 Urinary tract infection, site not specified: Secondary | ICD-10-CM

## 2022-02-26 DIAGNOSIS — G8929 Other chronic pain: Secondary | ICD-10-CM

## 2022-02-26 NOTE — H&P (Signed)
TOTAL KNEE ADMISSION H&P ? ?Patient is being admitted for left total knee arthroplasty. ? ?Subjective: ? ?Chief Complaint:left knee pain. ? ?HPI: Maria Spencer, 76 y.o. female, has a history of pain and functional disability in the left knee due to arthritis and has failed non-surgical conservative treatments for greater than 12 weeks to includecorticosteriod injections and activity modification.  Onset of symptoms was gradual, starting 6 years ago with gradually worsening course since that time. The patient noted no past surgery on the left knee(s).  Patient currently rates pain in the left knee(s) at 7 out of 10 with activity. Patient has night pain, worsening of pain with activity and weight bearing, pain that interferes with activities of daily living, pain with passive range of motion, crepitus, and joint swelling.  Patient has evidence of periarticular osteophytes and joint space narrowing by imaging studies. There is no active infection. ? ?Patient Active Problem List  ? Diagnosis Date Noted  ? Left lateral knee pain 10/11/2021  ? Partial tear of right hamstring 09/25/2021  ? Diverticulitis 03/23/2021  ? Osteoarthritis of knee (Left) 03/05/2021  ? Prediabetes 02/06/2021  ? Chronic bursitis of shoulder area (Right) 11/09/2020  ? Chronic shoulder pain (Right) 11/09/2020  ? Shoulder blade pain (Right) 11/09/2020  ? Scoliosis of thoracic spine, unspecified scoliosis type 11/08/2020  ? 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/20/2017) 11/08/2020  ? Spondylosis without myelopathy or radiculopathy, cervical region 10/26/2020  ? Abnormal MRI, cervical spine (09/26/2020) 10/23/2020  ? Cervical facet syndrome (Bilateral) 10/23/2020  ? Cervicogenic headache (Bilateral) 10/23/2020  ? Occipital neuralgia (greater occipital nerve) (Bilateral) 10/23/2020  ? Cervico-occipital neuralgia (Bilateral) 10/23/2020  ? Cervical facet hypertrophy (Multilevel) (Bilateral) 10/23/2020  ? Tear of right gluteus minimus  tendon 10/06/2020  ? Tear of right gluteus medius tendon 10/06/2020  ? Osteoarthritis of AC (acromioclavicular) joints (Bilateral) 10/05/2020  ? Chronic Acromioclavicular (AC) joint pain (Bilateral) 10/05/2020  ? Osteoarthritis of glenohumeral joint (Left) 10/05/2020  ? Chronic shoulder pain (Bilateral) (R>L) 10/02/2020  ? Chronic shoulder pain after shoulder replacement (Right) 10/02/2020  ? Cervicalgia 09/13/2020  ? Chronic upper extremity pain (Right) 09/13/2020  ? Cervical spondylosis with radiculopathy (Right) 09/13/2020  ? Cervical radiculopathy at C7 (Right) 09/13/2020  ? Atypical chest pain 07/13/2020  ? 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 Exertional dyspnea 07/13/2020  ? Palpitation 07/13/2020  ? GERD (gastroesophageal reflux disease) 07/13/2020  ? Insomnia 07/11/2020  ? Breast cancer ([place]) 06/22/2020  ? 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) 06/22/2020  ? DDD (degenerative disc disease), cervical 06/22/2020  ? DDD (degenerative disc disease), thoracic 06/22/2020  ? DDD (degenerative disc disease), lumbar 06/22/2020  ? Displacement of thoracic intervertebral disc (C6-T3, T4-5, T6-7, T8-9) 06/22/2020  ? Non-traumatic compression fracture of T8 thoracic vertebra, sequela 06/22/2020  ? Spondylosis without myelopathy or radiculopathy, thoracic region 05/11/2020  ? Closed wedge compression fracture of T8 vertebra ([place]) 05/11/2020  ? Osteoarthritis of hips (Bilateral) 05/11/2020  ? Abnormal thoracolumbar CT myelogram (01/05/2020) 05/11/2020  ? Chronic pain syndrome 05/08/2020  ? Colon cancer screening 05/08/2020  ? Disorder of skeletal system 05/08/2020  ? Problems influencing health status 05/08/2020  ? Chronic lower extremity pain (3ry area of Pain) (Right) 05/08/2020  ? Chronic knee pain (4th area of Pain) (Posterior) (Left) 05/08/2020  ? Thoracic facet syndrome (Bilateral) 05/08/2020  ? Chronic sacroiliac joint pain (Bilateral) 05/08/2020  ? Osteoporosis 01/14/2020  ? Chronic low back pain with sciatica  01/14/2020  ? Chronic thoracic back pain (1ry area of Pain) (  Bilateral) (R>L) 01/14/2020  ? Fusion of spine of lumbosacral region 12/13/2019  ? Lumbosacral spondylosis with radiculopathy 12/13/2019  ? S/P LASIK (laser assisted in situ keratomileusis) of both eyes 11/01/2019  ? Aortic atherosclerosis ([place]) 09/17/2019  ? Chronic hip pain (2ry area of Pain) (Bilateral) (R>L) 09/13/2019  ? Age-related nuclear cataract of right eye 08/19/2019  ? Age-related nuclear cataract of left eye 08/19/2019  ? Recurrent incisional hernia with incarceration & SBO s/p reduction 05/28/2018  ? Arthritis 01/07/2018  ? Chronic UTI 12/31/2017  ? IBS (irritable bowel syndrome) 12/29/2017  ? History of breast cancer 12/29/2017  ? Baker's cyst of knee (Left) 12/29/2017  ? Scoliosis of cervical region due to degenerative disease of spine in adult 09/18/2017  ? Hypothyroidism 02/15/2013  ? Hyperlipidemia 02/15/2013  ? Elevated blood pressure reading with diagnosis of hypertension 02/15/2013  ? Essential hypertension 02/15/2013  ? ?Past Medical History:  ?Diagnosis Date  ? Arthritis   ? neck, back, left knee;   ? Breast cancer ([place]) 2009  ? right lumpectomy s/p radiation x 1 week 2x per day only   ? Chicken pox   ? Chronic UTI   ? established with urology   ? COVID-19   ? ? 12/2020 sob  took antiviral, 08/15/21  ? Diverticular disease   ? -osis and -itis   ? Esophagitis   ? egd 05/17/14 see report scanned into chart  ? History of kidney stones   ? Hormone disorder   ? Hyperlipidemia   ? Hypertension   ? controlled well with medication;   ? Hypothyroidism   ? Osteoporosis   ? Personal history of radiation therapy 2009  ? F/U right breast cancer  ? Thyroid disease   ? hypothyroidism   ?  ?Past Surgical History:  ?Procedure Laterality Date  ? BONE GRAFT HIP ILIAC CREST    ? + cage left hip 10/23/17   ? BREAST BIOPSY Left 2009  ? clip,benign  ? BREAST BIOPSY Right 2009  ? +  ? BREAST LUMPECTOMY Right 2009  ? 2009 lumpectomy   ? CARDIAC CATHETERIZATION     ? No stents; [place] doesn't recall facility +84yr ago  ? CATARACT EXTRACTION, BILATERAL Bilateral 10/2019  ? COLONOSCOPY WITH PROPOFOL N/A 05/15/2020  ? Procedure: COLONOSCOPY WITH PROPOFOL;  Surgeon: VLin Landsman MD;  [facility];  Service: Gastroenterology;  Laterality: N/A;  ? EYE SURGERY    ? [place]PA[place]    ? INSERTION OF MESH N/A 05/29/2018  ? Procedure: INSERTION OF MESH;  Surgeon: MJohnathan Hausen MD;  Location: [facility];  Service: General;  Laterality: N/A;  ? JOINT REPLACEMENT    ? QUADRICEPS TENDON REPAIR Right 12/05/2020  ? Procedure: OPEN REPAIR OF RIGHT GLUTEUS MINIMUS TENDON;  Surgeon: PCorky Mull MD;  Location: [facility] ORS;  Service: Orthopedics;  Laterality: Right;  ? right gluteus medius/minimus repair Dr. PRoland Rack1/2022     ? Dr. PRoland Rack ? SHOULDER SURGERY Left 2008, 2009  ? x2 rotator cuff surgeries left shoulders  ? SPINAL FUSION  07/24/2020  ? SPINE SURGERY  10/23/2017  ? spinal 09/2017 h/o scoliosis [facility] L4-S1 OLIF T10 to ililum fusion  ? TONSILLECTOMY    ? age 76y.o.   ? TOTAL SHOULDER REPLACEMENT Right 2012  ? TUBAL LIGATION    ? VENTRAL HERNIA REPAIR N/A 05/29/2018  ? Procedure: LAPAROSCOPIC VENTRAL / LATERAL HERNIA REPAIR;  Surgeon: MJohnathan Hausen MD;  Location:  [facility];  Service: General;  Laterality: N/A;  ?  ?Current Outpatient Medications  ?Medication Sig Dispense Refill Last Dose  ? amoxicillin-clavulanate (AUGMENTIN) 875-125 MG tablet Take 1 tablet by mouth 2 (two) times daily. X 10 days with food 20 tablet 0   ? atorvastatin (LIPITOR) 20 MG tablet TAKE 1 TABLET EVERY OTHER  DAY (DISCONTINUE LIPITOR   '10MG'$ . INCREASED DOSE) 45 tablet 3   ? Calcium Carb-Cholecalciferol ([employer] CALCIUM 600 & VITAMIN D3 PO) Take 1 tablet by mouth daily. (Patient not taking: Reported on 02/12/2022)     ? fluticasone (FLONASE) 50 MCG/ACT nasal spray Place 2 sprays into both nostrils daily. Max b/l nostrils 48 g 4   ? HYDROcodone-acetaminophen (NORCO)  5-325 MG tablet Take 1 tablet by mouth 2 (two) times daily as needed for moderate pain. 20 tablet 0   ? levothyroxine (SYNTHROID) 88 MCG tablet Take 1 tablet (88 mcg total) by mouth daily before breakfas

## 2022-02-26 NOTE — H&P (View-Only) (Signed)
TOTAL KNEE ADMISSION H&P ? ?Patient is being admitted for left total knee arthroplasty. ? ?Subjective: ? ?Chief Complaint:left knee pain. ? ?HPI: Maria Spencer, 76 y.o. female, has a history of pain and functional disability in the left knee due to arthritis and has failed non-surgical conservative treatments for greater than 12 weeks to includecorticosteriod injections and activity modification.  Onset of symptoms was gradual, starting 6 years ago with gradually worsening course since that time. The patient noted no past surgery on the left knee(s).  Patient currently rates pain in the left knee(s) at 7 out of 10 with activity. Patient has night pain, worsening of pain with activity and weight bearing, pain that interferes with activities of daily living, pain with passive range of motion, crepitus, and joint swelling.  Patient has evidence of periarticular osteophytes and joint space narrowing by imaging studies. There is no active infection. ? ?Patient Active Problem List  ? Diagnosis Date Noted  ? Left lateral knee pain 10/11/2021  ? Partial tear of right hamstring 09/25/2021  ? Diverticulitis 03/23/2021  ? Osteoarthritis of knee (Left) 03/05/2021  ? Prediabetes 02/06/2021  ? Chronic bursitis of shoulder area (Right) 11/09/2020  ? Chronic shoulder pain (Right) 11/09/2020  ? Shoulder blade pain (Right) 11/09/2020  ? Scoliosis of thoracic spine, unspecified scoliosis type 11/08/2020  ? 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/20/2017) 11/08/2020  ? Spondylosis without myelopathy or radiculopathy, cervical region 10/26/2020  ? Abnormal MRI, cervical spine (09/26/2020) 10/23/2020  ? Cervical facet syndrome (Bilateral) 10/23/2020  ? Cervicogenic headache (Bilateral) 10/23/2020  ? Occipital neuralgia (greater occipital nerve) (Bilateral) 10/23/2020  ? Cervico-occipital neuralgia (Bilateral) 10/23/2020  ? Cervical facet hypertrophy (Multilevel) (Bilateral) 10/23/2020  ? Tear of right gluteus minimus  tendon 10/06/2020  ? Tear of right gluteus medius tendon 10/06/2020  ? Osteoarthritis of AC (acromioclavicular) joints (Bilateral) 10/05/2020  ? Chronic Acromioclavicular (AC) joint pain (Bilateral) 10/05/2020  ? Osteoarthritis of glenohumeral joint (Left) 10/05/2020  ? Chronic shoulder pain (Bilateral) (R>L) 10/02/2020  ? Chronic shoulder pain after shoulder replacement (Right) 10/02/2020  ? Cervicalgia 09/13/2020  ? Chronic upper extremity pain (Right) 09/13/2020  ? Cervical spondylosis with radiculopathy (Right) 09/13/2020  ? Cervical radiculopathy at C7 (Right) 09/13/2020  ? Atypical chest pain 07/13/2020  ? 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 Exertional dyspnea 07/13/2020  ? Palpitation 07/13/2020  ? GERD (gastroesophageal reflux disease) 07/13/2020  ? Insomnia 07/11/2020  ? Breast cancer ([place]) 06/22/2020  ? 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) 06/22/2020  ? DDD (degenerative disc disease), cervical 06/22/2020  ? DDD (degenerative disc disease), thoracic 06/22/2020  ? DDD (degenerative disc disease), lumbar 06/22/2020  ? Displacement of thoracic intervertebral disc (C6-T3, T4-5, T6-7, T8-9) 06/22/2020  ? Non-traumatic compression fracture of T8 thoracic vertebra, sequela 06/22/2020  ? Spondylosis without myelopathy or radiculopathy, thoracic region 05/11/2020  ? Closed wedge compression fracture of T8 vertebra ([place]) 05/11/2020  ? Osteoarthritis of hips (Bilateral) 05/11/2020  ? Abnormal thoracolumbar CT myelogram (01/05/2020) 05/11/2020  ? Chronic pain syndrome 05/08/2020  ? Colon cancer screening 05/08/2020  ? Disorder of skeletal system 05/08/2020  ? Problems influencing health status 05/08/2020  ? Chronic lower extremity pain (3ry area of Pain) (Right) 05/08/2020  ? Chronic knee pain (4th area of Pain) (Posterior) (Left) 05/08/2020  ? Thoracic facet syndrome (Bilateral) 05/08/2020  ? Chronic sacroiliac joint pain (Bilateral) 05/08/2020  ? Osteoporosis 01/14/2020  ? Chronic low back pain with sciatica  01/14/2020  ? Chronic thoracic back pain (1ry area of Pain) (  Bilateral) (R>L) 01/14/2020  ? Fusion of spine of lumbosacral region 12/13/2019  ? Lumbosacral spondylosis with radiculopathy 12/13/2019  ? S/P LASIK (laser assisted in situ keratomileusis) of both eyes 11/01/2019  ? Aortic atherosclerosis ([place]) 09/17/2019  ? Chronic hip pain (2ry area of Pain) (Bilateral) (R>L) 09/13/2019  ? Age-related nuclear cataract of right eye 08/19/2019  ? Age-related nuclear cataract of left eye 08/19/2019  ? Recurrent incisional hernia with incarceration & SBO s/p reduction 05/28/2018  ? Arthritis 01/07/2018  ? Chronic UTI 12/31/2017  ? IBS (irritable bowel syndrome) 12/29/2017  ? History of breast cancer 12/29/2017  ? Baker's cyst of knee (Left) 12/29/2017  ? Scoliosis of cervical region due to degenerative disease of spine in adult 09/18/2017  ? Hypothyroidism 02/15/2013  ? Hyperlipidemia 02/15/2013  ? Elevated blood pressure reading with diagnosis of hypertension 02/15/2013  ? Essential hypertension 02/15/2013  ? ?Past Medical History:  ?Diagnosis Date  ? Arthritis   ? neck, back, left knee;   ? Breast cancer ([place]) 2009  ? right lumpectomy s/p radiation x 1 week 2x per day only   ? Chicken pox   ? Chronic UTI   ? established with urology   ? COVID-19   ? ? 12/2020 sob  took antiviral, 08/15/21  ? Diverticular disease   ? -osis and -itis   ? Esophagitis   ? egd 05/17/14 see report scanned into chart  ? History of kidney stones   ? Hormone disorder   ? Hyperlipidemia   ? Hypertension   ? controlled well with medication;   ? Hypothyroidism   ? Osteoporosis   ? Personal history of radiation therapy 2009  ? F/U right breast cancer  ? Thyroid disease   ? hypothyroidism   ?  ?Past Surgical History:  ?Procedure Laterality Date  ? BONE GRAFT HIP ILIAC CREST    ? + cage left hip 10/23/17   ? BREAST BIOPSY Left 2009  ? clip,benign  ? BREAST BIOPSY Right 2009  ? +  ? BREAST LUMPECTOMY Right 2009  ? 2009 lumpectomy   ? CARDIAC CATHETERIZATION     ? No stents; [place] doesn't recall facility +57yr ago  ? CATARACT EXTRACTION, BILATERAL Bilateral 10/2019  ? COLONOSCOPY WITH PROPOFOL N/A 05/15/2020  ? Procedure: COLONOSCOPY WITH PROPOFOL;  Surgeon: VLin Landsman MD;  [facility];  Service: Gastroenterology;  Laterality: N/A;  ? EYE SURGERY    ? [place] ? HERNIA REPAIR    ? INSERTION OF MESH N/A 05/29/2018  ? Procedure: INSERTION OF MESH;  Surgeon: MJohnathan Hausen MD;  Location: [facility];  Service: General;  Laterality: N/A;  ? JOINT REPLACEMENT    ? QUADRICEPS TENDON REPAIR Right 12/05/2020  ? Procedure: OPEN REPAIR OF RIGHT GLUTEUS MINIMUS TENDON;  Surgeon: PCorky Mull MD;  Location: [facility];  Service: Orthopedics;  Laterality: Right;  ? right gluteus medius/minimus repair Dr. PRoland Rack1/2022     ? Dr. PRoland Rack ? SHOULDER SURGERY Left 2008, 2009  ? x2 rotator cuff surgeries left shoulders  ? SPINAL FUSION  07/24/2020  ? SPINE SURGERY  10/23/2017  ? spinal 09/2017 h/o scoliosis [facility] L4-S1 OLIF T10 to ililum fusion  ? TONSILLECTOMY    ? age 76y.o.   ? TOTAL SHOULDER REPLACEMENT Right 2012  ? TUBAL LIGATION    ? VENTRAL HERNIA REPAIR N/A 05/29/2018  ? Procedure: LAPAROSCOPIC VENTRAL / LATERAL HERNIA REPAIR;  Surgeon: MJohnathan Hausen MD;  Location:  [facility];  Service: General;  Laterality: N/A;  ?  ?Current Outpatient Medications  ?Medication Sig Dispense Refill Last Dose  ? amoxicillin-clavulanate (AUGMENTIN) 875-125 MG tablet Take 1 tablet by mouth 2 (two) times daily. X 10 days with food 20 tablet 0   ? atorvastatin (LIPITOR) 20 MG tablet TAKE 1 TABLET EVERY OTHER  DAY (DISCONTINUE LIPITOR   '10MG'$ . INCREASED DOSE) 45 tablet 3   ? Calcium Carb-Cholecalciferol ([employer] CALCIUM 600 & VITAMIN D3 PO) Take 1 tablet by mouth daily. (Patient not taking: Reported on 02/12/2022)     ? fluticasone (FLONASE) 50 MCG/ACT nasal spray Place 2 sprays into both nostrils daily. Max b/l nostrils 48 g 4   ? HYDROcodone-acetaminophen (NORCO)  5-325 MG tablet Take 1 tablet by mouth 2 (two) times daily as needed for moderate pain. 20 tablet 0   ? levothyroxine (SYNTHROID) 88 MCG tablet Take 1 tablet (88 mcg total) by mouth daily before breakfas

## 2022-03-05 ENCOUNTER — Encounter (HOSPITAL_COMMUNITY): Payer: Medicare Other

## 2022-03-08 NOTE — Patient Instructions (Signed)
2  VISITORS ARE ALLOWED TO COME WITH YOU AND STAY IN THE WAITING ROOM ONLY DURING PRE OP AND PROCEDURE.   ? ?**NO VISITORS ARE ALLOWED IN THE SHORT STAY AREA OR RECOVERY ROOM!!** ? ?IF YOU WILL BE ADMITTED INTO THE HOSPITAL YOU ARE ALLOWED ONLY 4 SUPPORT PEOPLE DURING VISITATION HOURS ONLY (7 AM -8PM)   ?The support person(s) must pass our screening, gel in and out, and wear a mask at all times, including in the patient?s room. ?Patients must also wear a mask when staff or their support person are in the room. ?Visitors GUEST BADGE MUST BE WORN VISIBLY  ?One adult visitor may remain with you overnight and MUST be in the room by 8 P.M. ?  ? ? Your procedure is scheduled on: 03/18/22 ? ? Report to [facility] Main Entrance ? ?  Report to admitting at 7:50 AM  ? ? Call this number if you have problems the morning of surgery [phone] ? ? Do not eat food :After Midnight. ? ? After Midnight you may have the following liquids until _7:30_AM DAY OF SURGERY ? ?Water ?Black Coffee (sugar ok, NO MILK/CREAM OR CREAMERS)  ?Tea (sugar ok, NO MILK/CREAM OR CREAMERS) regular and decaf                             ?Plain Jell-O (NO RED)                                           ?Fruit ices (not with fruit pulp, NO RED)                                     ?Popsicles (NO RED)                                                                  ?Juice: apple, WHITE grape, WHITE cranberry ?Sports drinks like Gatorade (NO RED) ?Clear broth(vegetable,chicken,beef) ? ? ?  ?  ?The day of surgery:  ?Drink ONE (1) Pre-Surgery Clear Ensure at 7:15 AM the morning of surgery. Drink in one sitting. Do not sip.  ?This drink was given to you during your hospital  ?pre-op appointment visit. ?Nothing else to drink after completing the  ?Pre-Surgery Clear Ensure at 7:30 AM ?  ?       If you have questions, please contact your surgeon?s office. ? ? ?  ?  ?Oral Hygiene is also important to reduce your risk of infection.                                     ?Remember - BRUSH YOUR TEETH THE MORNING OF SURGERY WITH YOUR REGULAR TOOTHPASTE ? ? Do NOT smoke after Midnight ? ? Take these medicines the morning of surgery with A SIP OF WATER: Metoprolol, Synthroid ? ? ?Bring CPAP mask and tubing day of surgery. ?                  ?  You may not have any metal on your body including hair pins, jewelry, and body piercing ? ?           Do not wear make-up, lotions, powders, perfumes or deodorant ? ?Do not wear nail polish including gel and S&S, artificial/acrylic nails, or any other type of covering on natural nails including finger and toenails. If you have artificial nails, gel coating, etc. that needs to be removed by a nail salon please have this removed prior to surgery or surgery may need to be canceled/ delayed if the surgeon/ anesthesia feels like they are unable to be safely monitored.  ? ?Do not shave  48 hours prior to surgery.  ? ? ? Do not bring valuables to the hospital. [facility] NOT ?            RESPONSIBLE   FOR VALUABLES. ? ? Contacts, dentures or bridgework may not be worn into surgery. ?. ?  ? Patients discharged on the day of surgery will not be allowed to drive home.  Someone NEEDS to stay with you for the first 24 hours after anesthesia. ? ? Special Instructions: Bring a copy of your healthcare power of attorney and living will documents the day of surgery if you haven't scanned them before. ? ?            Please read over the following fact sheets you were given: IF [place] [phone] ? ?   [facility] - Preparing for Surgery ?Before surgery, you can play an important role.  Because skin is not sterile, your skin needs to be as free of germs as possible.  You can reduce the number of germs on your skin by washing with CHG (chlorahexidine gluconate) soap before surgery.  CHG is an antiseptic cleaner which kills germs and bonds with the skin to continue killing germs even after  washing. ?Please DO NOT use if you have an allergy to CHG or antibacterial soaps.  If your skin becomes reddened/irritated stop using the CHG and inform your nurse when you arrive at Short Stay. ?Do not shave (including legs and underarms) for at least 48 hours prior to the first CHG shower.   ?Please follow these instructions carefully: ? 1.  Shower with CHG Soap the night before surgery and the  morning of Surgery. ? 2.  If you choose to wash your hair, wash your hair first as usual with your  normal  shampoo. ? 3.  After you shampoo, rinse your hair and body thoroughly to remove the  shampoo.                      ?      4.  Use CHG as you would any other liquid soap.  You can apply chg directly  to the skin and wash  ?                     Gently with a scrungie or clean washcloth. ? 5.  Apply the CHG Soap to your body ONLY FROM THE NECK DOWN.   Do not use on face/ open      ?                     Wound or open sores. Avoid contact with eyes, ears mouth and genitals (private parts).  ?  Wash face,  Genitals (private parts) with your normal soap. ?            6.  Wash thoroughly, paying special attention to the area where your surgery  will be performed. ? 7.  Thoroughly rinse your body with warm water from the neck down. ? 8.  DO NOT shower/wash with your normal soap after using and rinsing off  the CHG Soap. ?               9.  Pat yourself dry with a clean towel. ?           10.  Wear clean pajamas. ?           11.  Place clean sheets on your bed the night of your first shower and do not  sleep with pets. ?Day of Surgery : ?Do not apply any lotions/deodorants the morning of surgery.  Please wear clean clothes to the hospital/surgery center. ? ?FAILURE TO FOLLOW THESE INSTRUCTIONS MAY RESULT IN THE CANCELLATION OF YOUR SURGERY ?PATIENT SIGNATURE_________________________________ ? ?NURSE  SIGNATURE__________________________________ ? ?________________________________________________________________________  ? ?Incentive Spirometer ? ?An incentive spirometer is a tool that can help keep your lungs clear and active. This tool measures how well you are filling your lungs with each breath. Taking long deep breaths may help reverse or decrease the chance of developing breathing (pulmonary) problems (especially infection) following: ?A long period of time when you are unable to move or be active. ?BEFORE THE PROCEDURE  ?If the spirometer includes an indicator to show your best effort, your nurse or respiratory therapist will set it to a desired goal. ?If possible, sit up straight or lean slightly forward. Try not to slouch. ?Hold the incentive spirometer in an upright position. ?INSTRUCTIONS FOR USE  ?Sit on the edge of your bed if possible, or sit up as far as you can in bed or on a chair. ?Hold the incentive spirometer in an upright position. ?Breathe out normally. ?Place the mouthpiece in your mouth and seal your lips tightly around it. ?Breathe in slowly and as deeply as possible, raising the piston or the ball toward the top of the column. ?Hold your breath for 3-5 seconds or for as long as possible. Allow the piston or ball to fall to the bottom of the column. ?Remove the mouthpiece from your mouth and breathe out normally. ?Rest for a few seconds and repeat Steps 1 through 7 at least 10 times every 1-2 hours when you are awake. Take your time and take a few normal breaths between deep breaths. ?The spirometer may include an indicator to show your best effort. Use the indicator as a goal to work toward during each repetition. ?After each set of 10 deep breaths, practice coughing to be sure your lungs are clear. If you have an incision (the cut made at the time of surgery), support your incision when coughing by placing a pillow or rolled up towels firmly against it. ?Once you are able to get out of  bed, walk around indoors and cough well. You may stop using the incentive spirometer when instructed by your caregiver.  ?RISKS AND COMPLICATIONS ?Take your time so you do not get dizzy or light-headed. ?If you are in pain, you may need to take or ask for pain medication before doing incentive spirometry. It is

## 2022-03-11 ENCOUNTER — Encounter (HOSPITAL_COMMUNITY)
Admission: RE | Admit: 2022-03-11 | Discharge: 2022-03-11 | Disposition: A | Discharge status: Home / Self Care | Payer: Medicare Other | Source: Ambulatory Visit | Attending: Orthopedic Surgery | Admitting: Orthopedic Surgery

## 2022-03-11 ENCOUNTER — Encounter (HOSPITAL_COMMUNITY): Payer: Self-pay

## 2022-03-11 ENCOUNTER — Telehealth: Payer: Self-pay | Admitting: Internal Medicine

## 2022-03-11 ENCOUNTER — Other Ambulatory Visit: Payer: Self-pay

## 2022-03-11 VITALS — BP 189/79 | HR 62 | Temp 97.9°F | Resp 16 | Ht 59.0 in | Wt 134.0 lb

## 2022-03-11 DIAGNOSIS — N390 Urinary tract infection, site not specified: Secondary | ICD-10-CM | POA: Diagnosis not present

## 2022-03-11 DIAGNOSIS — Z01818 Encounter for other preprocedural examination: Secondary | ICD-10-CM | POA: Insufficient documentation

## 2022-03-11 DIAGNOSIS — G8929 Other chronic pain: Secondary | ICD-10-CM | POA: Insufficient documentation

## 2022-03-11 DIAGNOSIS — Z20822 Contact with and (suspected) exposure to covid-19: Secondary | ICD-10-CM | POA: Diagnosis not present

## 2022-03-11 DIAGNOSIS — M25562 Pain in left knee: Secondary | ICD-10-CM | POA: Insufficient documentation

## 2022-03-11 LAB — URINALYSIS, ROUTINE W REFLEX MICROSCOPIC
Bilirubin Urine: NEGATIVE
Glucose, UA: NEGATIVE mg/dL
Hgb urine dipstick: NEGATIVE
Ketones, ur: NEGATIVE mg/dL
Leukocytes,Ua: NEGATIVE
Nitrite: NEGATIVE
Protein, ur: NEGATIVE mg/dL
Specific Gravity, Urine: 1.006 (ref 1.005–1.030)
pH: 5 (ref 5.0–8.0)

## 2022-03-11 LAB — CBC WITH DIFFERENTIAL/PLATELET
Abs Immature Granulocytes: 0.02 10*3/uL (ref 0.00–0.07)
Basophils Absolute: 0.1 10*3/uL (ref 0.0–0.1)
Basophils Relative: 1 %
Eosinophils Absolute: 0.2 10*3/uL (ref 0.0–0.5)
Eosinophils Relative: 4 %
HCT: 41.3 % (ref 36.0–46.0)
Hemoglobin: 13.5 g/dL (ref 12.0–15.0)
Immature Granulocytes: 0 %
Lymphocytes Relative: 19 %
Lymphs Abs: 1 10*3/uL (ref 0.7–4.0)
MCH: 29.4 pg (ref 26.0–34.0)
MCHC: 32.7 g/dL (ref 30.0–36.0)
MCV: 90 fL (ref 80.0–100.0)
Monocytes Absolute: 0.3 10*3/uL (ref 0.1–1.0)
Monocytes Relative: 6 %
Neutro Abs: 3.7 10*3/uL (ref 1.7–7.7)
Neutrophils Relative %: 70 %
Platelets: 204 10*3/uL (ref 150–400)
RBC: 4.59 MIL/uL (ref 3.87–5.11)
RDW: 14.3 % (ref 11.5–15.5)
WBC: 5.3 10*3/uL (ref 4.0–10.5)
nRBC: 0 % (ref 0.0–0.2)

## 2022-03-11 LAB — SURGICAL PCR SCREEN
MRSA, PCR: NEGATIVE
Staphylococcus aureus: NEGATIVE

## 2022-03-11 LAB — COMPREHENSIVE METABOLIC PANEL
ALT: 15 U/L (ref 0–44)
AST: 19 U/L (ref 15–41)
Albumin: 3.9 g/dL (ref 3.5–5.0)
Alkaline Phosphatase: 72 U/L (ref 38–126)
Anion gap: 6 (ref 5–15)
BUN: 21 mg/dL (ref 8–23)
CO2: 28 mmol/L (ref 22–32)
Calcium: 9.1 mg/dL (ref 8.9–10.3)
Chloride: 105 mmol/L (ref 98–111)
Creatinine, Ser: 0.54 mg/dL (ref 0.44–1.00)
GFR, Estimated: >60 mL/min (ref >60)
Glucose, Bld: 99 mg/dL (ref 70–99)
Potassium: 3.6 mmol/L (ref 3.5–5.1)
Sodium: 139 mmol/L (ref 135–145)
Total Bilirubin: 0.6 mg/dL (ref 0.3–1.2)
Total Protein: 7.1 g/dL (ref 6.5–8.1)

## 2022-03-11 NOTE — Progress Notes (Signed)
Anesthesia note: ? ?Bowel prep reminder:NA ? ?PCP - Dr. Darene Lamer. [facility] ?Cardiologist -none ?Other-  ? ?Chest x-ray - no ?EKG - 03/11/22-chart ?Stress Test - 2001 ?ECHO - 03/09/21-epic ?Cardiac Cath - 2001 done because of HTN ? ?Pacemaker/ICD device last checked:NA ? ?Sleep Study - no ?CPAP -  ? ?Pt is pre diabetic-NA ?Fasting Blood Sugar -  ?Checks Blood Sugar _____ ? ?Blood Thinner:NA ?Blood Thinner Instructions: ?Aspirin Instructions: ?Last Dose: ? ?Anesthesia review: yes ? ?Patient denies shortness of breath, fever, cough and chest pain at PAT appointment ?Pt reports no SOB with any activities. ? ?Patient verbalized understanding of instructions that were given to them at the PAT appointment. Patient was also instructed that they will need to review over the PAT instructions again at home before surgery. yes ?

## 2022-03-11 NOTE — Telephone Encounter (Signed)
Pt need refill on Zolpidem sent to [facility] in [place]  ?

## 2022-03-12 LAB — URINE CULTURE

## 2022-03-18 ENCOUNTER — Ambulatory Visit (HOSPITAL_BASED_OUTPATIENT_CLINIC_OR_DEPARTMENT_OTHER): Payer: Medicare Other | Admitting: Certified Registered"

## 2022-03-18 ENCOUNTER — Encounter (HOSPITAL_COMMUNITY): Payer: Self-pay | Admitting: Orthopedic Surgery

## 2022-03-18 ENCOUNTER — Ambulatory Visit (HOSPITAL_COMMUNITY): Payer: Medicare Other | Admitting: Certified Registered"

## 2022-03-18 ENCOUNTER — Other Ambulatory Visit: Payer: Self-pay

## 2022-03-18 ENCOUNTER — Ambulatory Visit (HOSPITAL_COMMUNITY): Payer: Medicare Other

## 2022-03-18 ENCOUNTER — Encounter (HOSPITAL_COMMUNITY)
Admission: RE | Disposition: A | Discharge status: Home / Self Care | Payer: Self-pay | Source: Home / Self Care | Attending: Orthopedic Surgery

## 2022-03-18 ENCOUNTER — Ambulatory Visit (HOSPITAL_COMMUNITY)
Admission: RE | Admit: 2022-03-18 | Discharge: 2022-03-19 | Disposition: A | Discharge status: Home / Self Care | Payer: Medicare Other | Attending: Orthopedic Surgery | Admitting: Orthopedic Surgery

## 2022-03-18 DIAGNOSIS — E039 Hypothyroidism, unspecified: Secondary | ICD-10-CM | POA: Insufficient documentation

## 2022-03-18 DIAGNOSIS — M21062 Valgus deformity, not elsewhere classified, left knee: Secondary | ICD-10-CM | POA: Insufficient documentation

## 2022-03-18 DIAGNOSIS — M1712 Unilateral primary osteoarthritis, left knee: Secondary | ICD-10-CM

## 2022-03-18 DIAGNOSIS — G8918 Other acute postprocedural pain: Secondary | ICD-10-CM | POA: Diagnosis not present

## 2022-03-18 DIAGNOSIS — Z471 Aftercare following joint replacement surgery: Secondary | ICD-10-CM | POA: Diagnosis not present

## 2022-03-18 DIAGNOSIS — I10 Essential (primary) hypertension: Secondary | ICD-10-CM | POA: Diagnosis not present

## 2022-03-18 HISTORY — PX: TOTAL KNEE ARTHROPLASTY: SHX125

## 2022-03-18 LAB — TYPE AND SCREEN
ABO/RH(D): O POS
Antibody Screen: NEGATIVE

## 2022-03-18 LAB — ABO/RH: ABO/RH(D): O POS

## 2022-03-18 IMAGING — DX DG KNEE 1-2V PORT*L*
2 series · 2 of 2 positions shown · non-contrast
Comparison: None.

CLINICAL DATA: Post op left knee surgery

EXAM:
PORTABLE LEFT KNEE - 1-2 VIEW

[knee ap]
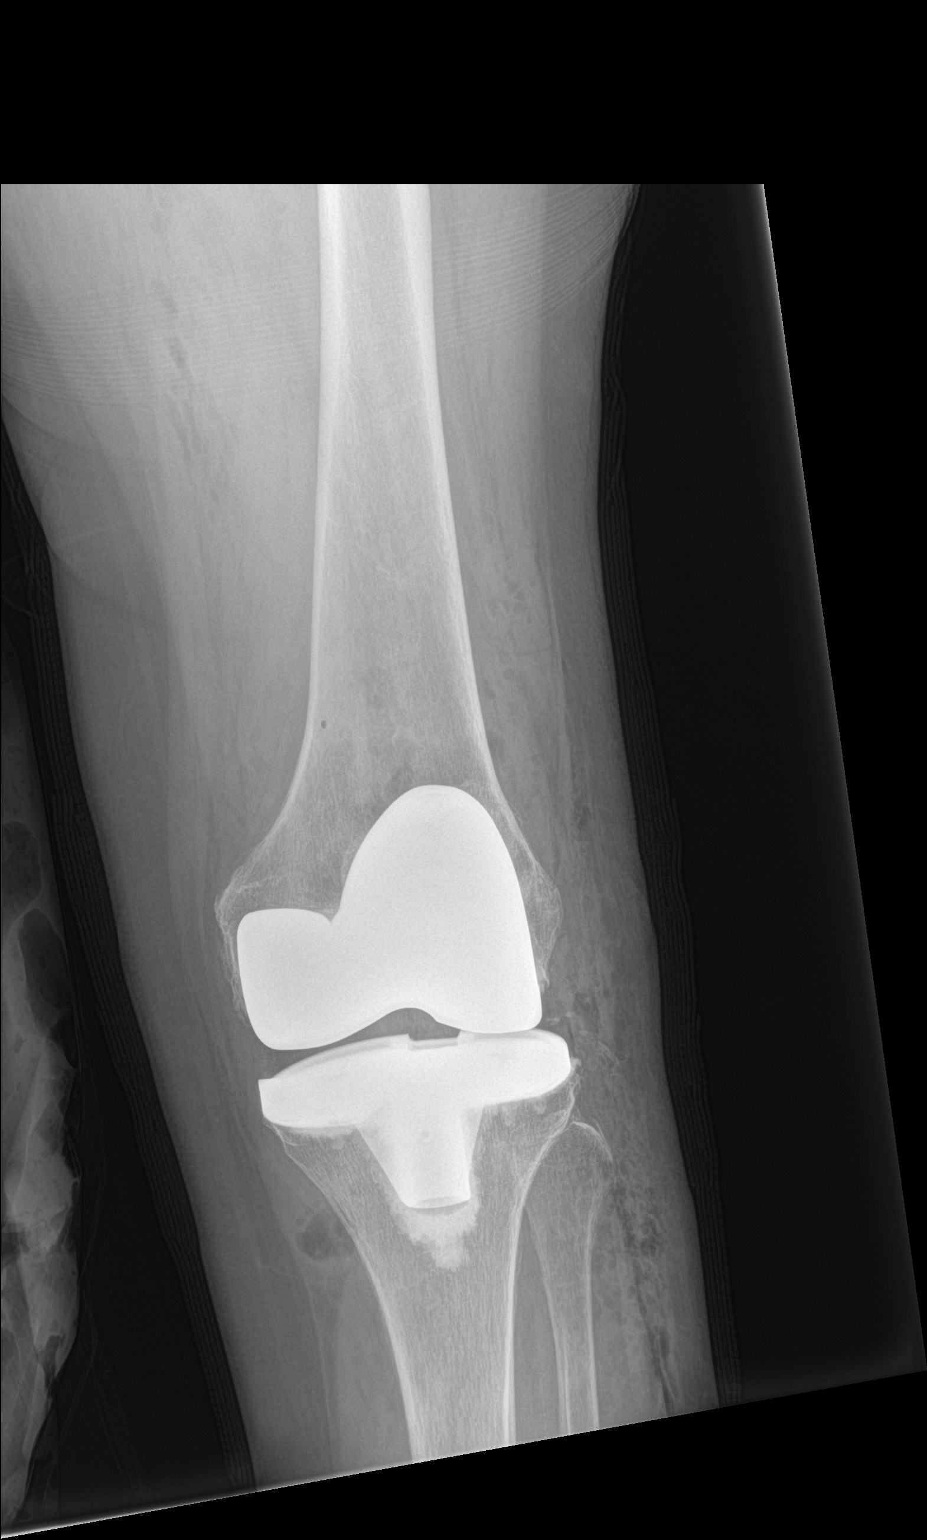

[knee lat]
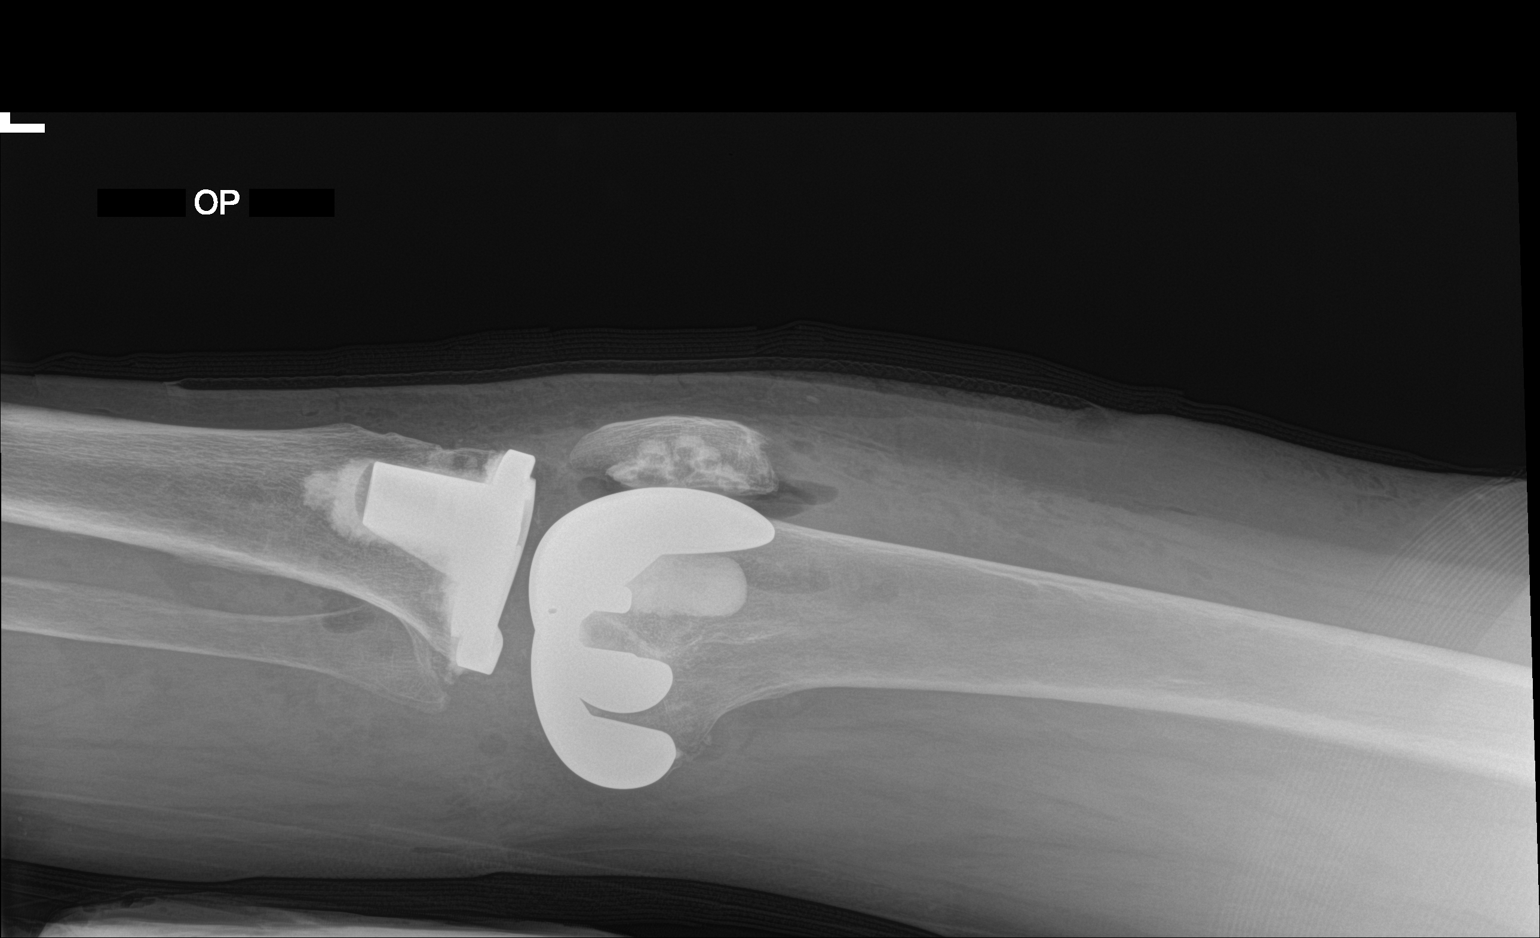

[2 of 2 positions shown; findings below may reference images not displayed]

FINDINGS: Post left total knee arthroplasty. Postoperative soft tissue
swelling and air. No evidence of complication.
IMPRESSION: Standard appearance post left total knee arthroplasty.

## 2022-03-18 SURGERY — ARTHROPLASTY, KNEE, TOTAL
Anesthesia: Choice | Site: Knee | Laterality: Left

## 2022-03-18 SURGERY — ARTHROPLASTY, KNEE, TOTAL
Anesthesia: Regional | Site: Knee | Laterality: Left

## 2022-03-18 MED ORDER — CEFAZOLIN SODIUM-DEXTROSE 2-4 GM/100ML-% IV SOLN
2.0000 g | Freq: Four times a day (QID) | INTRAVENOUS | Status: AC
  Administered 2022-03-18 (×2): 2 g via INTRAVENOUS
  Filled 2022-03-18 (×2): qty 100

## 2022-03-18 MED ORDER — TRANEXAMIC ACID-NACL 1000-0.7 MG/100ML-% IV SOLN
1000.0000 mg | INTRAVENOUS | Status: AC
  Administered 2022-03-18: 1000 mg via INTRAVENOUS
  Filled 2022-03-18: qty 100

## 2022-03-18 MED ORDER — EPHEDRINE SULFATE (PRESSORS) 50 MG/ML IJ SOLN
INTRAMUSCULAR | Status: DC | PRN
Start: 2022-03-18 — End: 2022-03-18
  Administered 2022-03-18: 5 mg via INTRAVENOUS

## 2022-03-18 MED ORDER — METHOCARBAMOL 500 MG IVPB - SIMPLE MED
500.0000 mg | Freq: Four times a day (QID) | INTRAVENOUS | Status: DC | PRN
  Administered 2022-03-18: 500 mg via INTRAVENOUS
  Filled 2022-03-18: qty 50

## 2022-03-18 MED ORDER — MIDAZOLAM HCL 2 MG/2ML IJ SOLN
1.0000 mg | INTRAMUSCULAR | Status: DC
  Filled 2022-03-18: qty 2

## 2022-03-18 MED ORDER — ONDANSETRON HCL 4 MG/2ML IJ SOLN: 4.0000 mg | Freq: Once | INTRAMUSCULAR | Status: DC | PRN

## 2022-03-18 MED ORDER — DOCUSATE SODIUM 100 MG PO CAPS
100.0000 mg | ORAL_CAPSULE | Freq: Two times a day (BID) | ORAL | Status: DC
  Administered 2022-03-18 – 2022-03-19 (×2): 100 mg via ORAL
  Filled 2022-03-18 (×2): qty 1

## 2022-03-18 MED ORDER — CHLORHEXIDINE GLUCONATE 0.12 % MT SOLN
15.0000 mL | Freq: Once | OROMUCOSAL | Status: AC
  Administered 2022-03-18: 15 mL via OROMUCOSAL

## 2022-03-18 MED ORDER — ZOLPIDEM TARTRATE 5 MG PO TABS: 2.5000 mg | ORAL_TABLET | Freq: Every evening | ORAL | 1 refills | Status: DC | PRN

## 2022-03-18 MED ORDER — ATORVASTATIN CALCIUM 20 MG PO TABS
20.0000 mg | ORAL_TABLET | Freq: Every day | ORAL | Status: DC
  Administered 2022-03-19: 20 mg via ORAL
  Filled 2022-03-18: qty 1

## 2022-03-18 MED ORDER — KETOROLAC TROMETHAMINE 15 MG/ML IJ SOLN
7.5000 mg | Freq: Four times a day (QID) | INTRAMUSCULAR | Status: DC
  Administered 2022-03-18 – 2022-03-19 (×3): 7.5 mg via INTRAVENOUS
  Filled 2022-03-18 (×3): qty 1

## 2022-03-18 MED ORDER — ONDANSETRON HCL 4 MG PO TABS: 4.0000 mg | ORAL_TABLET | Freq: Four times a day (QID) | ORAL | Status: DC | PRN

## 2022-03-18 MED ORDER — PHENYLEPHRINE HCL (PRESSORS) 10 MG/ML IV SOLN
INTRAVENOUS | Status: AC
  Filled 2022-03-18: qty 1

## 2022-03-18 MED ORDER — ONDANSETRON HCL 4 MG/2ML IJ SOLN
INTRAMUSCULAR | Status: AC
  Filled 2022-03-18: qty 2

## 2022-03-18 MED ORDER — ORAL CARE MOUTH RINSE: 15.0000 mL | Freq: Once | OROMUCOSAL | Status: AC

## 2022-03-18 MED ORDER — FENTANYL CITRATE (PF) 100 MCG/2ML IJ SOLN
INTRAMUSCULAR | Status: AC
  Filled 2022-03-18: qty 2

## 2022-03-18 MED ORDER — SODIUM CHLORIDE 0.9% FLUSH
INTRAVENOUS | Status: DC | PRN
  Administered 2022-03-18: 60 mL via INTRADERMAL

## 2022-03-18 MED ORDER — PHENOL 1.4 % MT LIQD: 1.0000 | OROMUCOSAL | Status: DC | PRN

## 2022-03-18 MED ORDER — DEXAMETHASONE SODIUM PHOSPHATE 10 MG/ML IJ SOLN
8.0000 mg | Freq: Once | INTRAMUSCULAR | Status: AC
  Administered 2022-03-18: 10 mg via INTRAVENOUS

## 2022-03-18 MED ORDER — PANTOPRAZOLE SODIUM 40 MG PO TBEC
40.0000 mg | DELAYED_RELEASE_TABLET | Freq: Every day | ORAL | Status: DC
  Administered 2022-03-19: 40 mg via ORAL
  Filled 2022-03-18: qty 1

## 2022-03-18 MED ORDER — WATER FOR IRRIGATION, STERILE IR SOLN
Status: DC | PRN
  Administered 2022-03-18: 2000 mL

## 2022-03-18 MED ORDER — BUPIVACAINE-EPINEPHRINE (PF) 0.5% -1:200000 IJ SOLN
INTRAMUSCULAR | Status: DC | PRN
  Administered 2022-03-18: 30 mL via PERINEURAL

## 2022-03-18 MED ORDER — FENTANYL CITRATE PF 50 MCG/ML IJ SOSY
25.0000 ug | PREFILLED_SYRINGE | INTRAMUSCULAR | Status: DC | PRN
  Administered 2022-03-18 (×3): 50 ug via INTRAVENOUS

## 2022-03-18 MED ORDER — VITAMIN B-12 1000 MCG PO TABS
1000.0000 ug | ORAL_TABLET | Freq: Every day | ORAL | Status: DC
  Administered 2022-03-19: 1000 ug via ORAL
  Filled 2022-03-18: qty 1

## 2022-03-18 MED ORDER — NON FORMULARY: 80.0000 mg | Freq: Every day | Status: DC

## 2022-03-18 MED ORDER — FENTANYL CITRATE (PF) 250 MCG/5ML IJ SOLN
INTRAMUSCULAR | Status: DC | PRN
  Administered 2022-03-18 (×6): 50 ug via INTRAVENOUS

## 2022-03-18 MED ORDER — ASPIRIN 81 MG PO CHEW
81.0000 mg | CHEWABLE_TABLET | Freq: Two times a day (BID) | ORAL | Status: DC
  Administered 2022-03-18 – 2022-03-19 (×2): 81 mg via ORAL
  Filled 2022-03-18 (×2): qty 1

## 2022-03-18 MED ORDER — ONDANSETRON HCL 4 MG/2ML IJ SOLN
INTRAMUSCULAR | Status: DC | PRN
  Administered 2022-03-18: 4 mg via INTRAVENOUS

## 2022-03-18 MED ORDER — LACTATED RINGERS IV SOLN: INTRAVENOUS | Status: DC

## 2022-03-18 MED ORDER — HYDRALAZINE HCL 20 MG/ML IJ SOLN
INTRAMUSCULAR | Status: AC
  Filled 2022-03-18: qty 1

## 2022-03-18 MED ORDER — TRANEXAMIC ACID 1000 MG/10ML IV SOLN
2000.0000 mg | INTRAVENOUS | Status: DC
  Filled 2022-03-18: qty 20

## 2022-03-18 MED ORDER — FENTANYL CITRATE PF 50 MCG/ML IJ SOSY
50.0000 ug | PREFILLED_SYRINGE | INTRAMUSCULAR | Status: DC
  Administered 2022-03-18 (×2): 50 ug via INTRAVENOUS
  Filled 2022-03-18: qty 2

## 2022-03-18 MED ORDER — PROPOFOL 10 MG/ML IV BOLUS
INTRAVENOUS | Status: DC | PRN
Start: 2022-03-18 — End: 2022-03-18
  Administered 2022-03-18: 200 mg via INTRAVENOUS

## 2022-03-18 MED ORDER — CEFAZOLIN SODIUM-DEXTROSE 2-4 GM/100ML-% IV SOLN
2.0000 g | INTRAVENOUS | Status: AC
  Administered 2022-03-18: 2 g via INTRAVENOUS
  Filled 2022-03-18: qty 100

## 2022-03-18 MED ORDER — SODIUM CHLORIDE (PF) 0.9 % IJ SOLN
INTRAMUSCULAR | Status: AC
  Filled 2022-03-18: qty 50

## 2022-03-18 MED ORDER — ZOLPIDEM TARTRATE 5 MG PO TABS
5.0000 mg | ORAL_TABLET | Freq: Every evening | ORAL | Status: DC | PRN
  Administered 2022-03-19: 5 mg via ORAL
  Filled 2022-03-18: qty 1

## 2022-03-18 MED ORDER — LEVOTHYROXINE SODIUM 88 MCG PO TABS
88.0000 ug | ORAL_TABLET | Freq: Every day | ORAL | Status: DC
  Administered 2022-03-19: 88 ug via ORAL
  Filled 2022-03-18: qty 1

## 2022-03-18 MED ORDER — HYDROMORPHONE HCL 1 MG/ML IJ SOLN
0.5000 mg | INTRAMUSCULAR | Status: DC | PRN
  Administered 2022-03-18: 1 mg via INTRAVENOUS
  Filled 2022-03-18: qty 1

## 2022-03-18 MED ORDER — FENTANYL CITRATE PF 50 MCG/ML IJ SOSY
PREFILLED_SYRINGE | INTRAMUSCULAR | Status: AC
  Filled 2022-03-18: qty 3

## 2022-03-18 MED ORDER — MENTHOL 3 MG MT LOZG: 1.0000 | LOZENGE | OROMUCOSAL | Status: DC | PRN

## 2022-03-18 MED ORDER — SODIUM CHLORIDE 0.9 % IV SOLN: INTRAVENOUS | Status: DC

## 2022-03-18 MED ORDER — SODIUM CHLORIDE (PF) 0.9 % IJ SOLN
INTRAMUSCULAR | Status: AC
  Filled 2022-03-18: qty 10

## 2022-03-18 MED ORDER — ADULT MULTIVITAMIN W/MINERALS CH
1.0000 | ORAL_TABLET | Freq: Every day | ORAL | Status: DC
  Administered 2022-03-19: 1 via ORAL
  Filled 2022-03-18: qty 1

## 2022-03-18 MED ORDER — SODIUM CHLORIDE 0.9 % IR SOLN
Status: DC | PRN
Start: 2022-03-18 — End: 2022-03-18
  Administered 2022-03-18: 3000 mL

## 2022-03-18 MED ORDER — METOPROLOL SUCCINATE ER 50 MG PO TB24
50.0000 mg | ORAL_TABLET | Freq: Every day | ORAL | Status: DC
  Administered 2022-03-19: 50 mg via ORAL
  Filled 2022-03-18: qty 1

## 2022-03-18 MED ORDER — AMISULPRIDE (ANTIEMETIC) 5 MG/2ML IV SOLN: 10.0000 mg | Freq: Once | INTRAVENOUS | Status: DC | PRN

## 2022-03-18 MED ORDER — BUPIVACAINE LIPOSOME 1.3 % IJ SUSP
INTRAMUSCULAR | Status: DC | PRN
Start: 2022-03-18 — End: 2022-03-18
  Administered 2022-03-18: 20 mL

## 2022-03-18 MED ORDER — ACETAMINOPHEN 325 MG PO TABS: 325.0000 mg | ORAL_TABLET | Freq: Four times a day (QID) | ORAL | Status: DC | PRN

## 2022-03-18 MED ORDER — METHOCARBAMOL 500 MG IVPB - SIMPLE MED
INTRAVENOUS | Status: AC
Start: 2022-03-18 — End: 2022-03-19
  Filled 2022-03-18: qty 50

## 2022-03-18 MED ORDER — OXYCODONE HCL 5 MG PO TABS
5.0000 mg | ORAL_TABLET | ORAL | Status: DC | PRN
  Administered 2022-03-18 – 2022-03-19 (×4): 10 mg via ORAL
  Filled 2022-03-18 (×5): qty 2

## 2022-03-18 MED ORDER — DIPHENHYDRAMINE HCL 12.5 MG/5ML PO ELIX: 12.5000 mg | ORAL_SOLUTION | ORAL | Status: DC | PRN

## 2022-03-18 MED ORDER — HYDRALAZINE HCL 20 MG/ML IJ SOLN
INTRAMUSCULAR | Status: DC | PRN
  Administered 2022-03-18 (×2): 4 mg via INTRAVENOUS

## 2022-03-18 MED ORDER — ACETAMINOPHEN 500 MG PO TABS
1000.0000 mg | ORAL_TABLET | Freq: Four times a day (QID) | ORAL | Status: AC
  Administered 2022-03-18 – 2022-03-19 (×4): 1000 mg via ORAL
  Filled 2022-03-18 (×5): qty 2

## 2022-03-18 MED ORDER — ONDANSETRON HCL 4 MG/2ML IJ SOLN: 4.0000 mg | Freq: Four times a day (QID) | INTRAMUSCULAR | Status: DC | PRN

## 2022-03-18 MED ORDER — POLYETHYLENE GLYCOL 3350 17 G PO PACK: 17.0000 g | PACK | Freq: Every day | ORAL | Status: DC | PRN

## 2022-03-18 MED ORDER — POVIDONE-IODINE 10 % EX SWAB
2.0000 "application " | Freq: Once | CUTANEOUS | Status: AC
  Administered 2022-03-18: 2 via TOPICAL

## 2022-03-18 MED ORDER — ACETAMINOPHEN 500 MG PO TABS
1000.0000 mg | ORAL_TABLET | Freq: Once | ORAL | Status: AC
  Administered 2022-03-18: 1000 mg via ORAL
  Filled 2022-03-18: qty 2

## 2022-03-18 MED ORDER — TELMISARTAN 40 MG PO TABS
80.0000 mg | ORAL_TABLET | Freq: Every day | ORAL | Status: DC
  Administered 2022-03-19: 80 mg via ORAL
  Filled 2022-03-18: qty 2

## 2022-03-18 MED ORDER — METHOCARBAMOL 500 MG PO TABS
500.0000 mg | ORAL_TABLET | Freq: Four times a day (QID) | ORAL | Status: DC | PRN
  Administered 2022-03-18: 500 mg via ORAL
  Filled 2022-03-18: qty 1

## 2022-03-18 MED ORDER — 0.9 % SODIUM CHLORIDE (POUR BTL) OPTIME
TOPICAL | Status: DC | PRN
Start: 2022-03-18 — End: 2022-03-18
  Administered 2022-03-18: 1000 mL

## 2022-03-18 MED ORDER — BUPIVACAINE LIPOSOME 1.3 % IJ SUSP
INTRAMUSCULAR | Status: AC
  Filled 2022-03-18: qty 20

## 2022-03-18 MED ORDER — BUPIVACAINE LIPOSOME 1.3 % IJ SUSP: 20.0000 mL | Freq: Once | INTRAMUSCULAR | Status: DC

## 2022-03-18 SURGICAL SUPPLY — 57 items
BAG COUNTER SPONGE SURGICOUNT (BAG) ×1 IMPLANT
BLADE SAG 18X100X1.27 (BLADE) ×2 IMPLANT
BLADE SAW SAG 35X64 .89 (BLADE) ×2 IMPLANT
BNDG COHESIVE 3X5 TAN ST LF (GAUZE/BANDAGES/DRESSINGS) ×2 IMPLANT
BNDG ELASTIC 6X10 VLCR STRL LF (GAUZE/BANDAGES/DRESSINGS) ×2 IMPLANT
BOWL SMART MIX CTS (DISPOSABLE) ×2 IMPLANT
CEMENT BONE R 1X40 (Cement) ×2 IMPLANT
CHLORAPREP W/TINT 26 (MISCELLANEOUS) ×4 IMPLANT
CLSR STERI-STRIP ANTIMIC 1/2X4 (GAUZE/BANDAGES/DRESSINGS) ×1 IMPLANT
COMP PSN MC ASF L 13 6-7/CD (Miscellaneous) ×2 IMPLANT
COMPONENT PSN ASF L 13 6-7/CD (Miscellaneous) IMPLANT
COVER SURGICAL LIGHT HANDLE (MISCELLANEOUS) ×2 IMPLANT
CUFF TOURN SGL QUICK 34 (TOURNIQUET CUFF) ×1
CUFF TRNQT CYL 34X4.125X (TOURNIQUET CUFF) ×1 IMPLANT
DERMABOND ADVANCED (GAUZE/BANDAGES/DRESSINGS) ×1
DERMABOND ADVANCED .7 DNX12 (GAUZE/BANDAGES/DRESSINGS) ×1 IMPLANT
DRAPE INCISE IOBAN 85X60 (DRAPES) ×2 IMPLANT
DRAPE SHEET LG 3/4 BI-LAMINATE (DRAPES) ×2 IMPLANT
DRAPE U-SHAPE 47X51 STRL (DRAPES) ×2 IMPLANT
DRESSING AQUACEL AG SP 3.5X10 (GAUZE/BANDAGES/DRESSINGS) ×1 IMPLANT
DRSG AQUACEL AG SP 3.5X10 (GAUZE/BANDAGES/DRESSINGS) ×2
GAUZE SPONGE 4X4 12PLY STRL (GAUZE/BANDAGES/DRESSINGS) ×2 IMPLANT
GLOVE BIO SURGEON STRL SZ8 (GLOVE) ×4 IMPLANT
GLOVE BIOGEL PI IND STRL 8 (GLOVE) ×1 IMPLANT
GLOVE BIOGEL PI INDICATOR 8 (GLOVE) ×1
GOWN STRL REUS W/ TWL XL LVL3 (GOWN DISPOSABLE) ×1 IMPLANT
GOWN STRL REUS W/TWL XL LVL3 (GOWN DISPOSABLE) ×1
HANDPIECE INTERPULSE COAX TIP (DISPOSABLE) ×1
HDLS TROCR DRIL PIN KNEE 75 (PIN) ×1
HOOD PEEL AWAY FLYTE STAYCOOL (MISCELLANEOUS) ×6 IMPLANT
KNEE SYSTEM FEMUR SZ 6 LT (Knees) ×1 IMPLANT
MANIFOLD NEPTUNE II (INSTRUMENTS) ×2 IMPLANT
MARKER SKIN DUAL TIP RULER LAB (MISCELLANEOUS) ×2 IMPLANT
NS IRRIG 1000ML POUR BTL (IV SOLUTION) ×2 IMPLANT
PACK TOTAL KNEE CUSTOM (KITS) ×2 IMPLANT
PIN DRILL HDLS TROCAR 75 4PK (PIN) IMPLANT
PROTECTOR NERVE ULNAR (MISCELLANEOUS) ×1 IMPLANT
SCREW HEADED 33MM KNEE (MISCELLANEOUS) ×2 IMPLANT
SET HNDPC FAN SPRY TIP SCT (DISPOSABLE) ×1 IMPLANT
SOLUTION IRRIG SURGIPHOR (IV SOLUTION) ×1 IMPLANT
SPIKE FLUID TRANSFER (MISCELLANEOUS) ×1 IMPLANT
STEM POLY PAT PLY 32M KNEE (Knees) ×1 IMPLANT
STEM TIBIA 5 DEG SZ D L KNEE (Knees) IMPLANT
STRIP CLOSURE SKIN 1/2X4 (GAUZE/BANDAGES/DRESSINGS) ×2 IMPLANT
SUT MNCRL AB 3-0 PS2 18 (SUTURE) ×2 IMPLANT
SUT STRATAFIX 0 PDS 27 VIOLET (SUTURE) ×2
SUT STRATAFIX PDO 1 14 VIOLET (SUTURE) ×1
SUT STRATFX PDO 1 14 VIOLET (SUTURE) ×1
SUT VIC AB 2-0 CT2 27 (SUTURE) ×4 IMPLANT
SUTURE STRATFX 0 PDS 27 VIOLET (SUTURE) ×1 IMPLANT
SUTURE STRATFX PDO 1 14 VIOLET (SUTURE) ×1 IMPLANT
SYR 50ML LL SCALE MARK (SYRINGE) ×2 IMPLANT
TIBIA STEM 5 DEG SZ D L KNEE (Knees) ×2 IMPLANT
TRAY FOLEY MTR SLVR 14FR STAT (SET/KITS/TRAYS/PACK) IMPLANT
TUBE SUCTION HIGH CAP CLEAR NV (SUCTIONS) ×2 IMPLANT
UNDERPAD 30X36 HEAVY ABSORB (UNDERPADS AND DIAPERS) ×2 IMPLANT
WRAP KNEE MAXI GEL POST OP (GAUZE/BANDAGES/DRESSINGS) IMPLANT

## 2022-03-18 NOTE — Op Note (Signed)
DATE OF SURGERY:  03/18/2022 ?TIME: 12:18 PM ? ?PATIENT NAME:  Maria Spencer   ?AGE: 76 y.o.  ? ? ?PRE-OPERATIVE DIAGNOSIS: End-stage left knee osteoarthritis, valgus deformity ? ?POST-OPERATIVE DIAGNOSIS:  Same ? ?PROCEDURE: Left total Knee Arthroplasty ? ?SURGEON:  Willaim Sheng, MD  ? ?ASSISTANT: Izola Price, RNFA, present and scrubbed throughout the case, critical for assistance with exposure, retraction, instrumentation, and closure. ? ? ?OPERATIVE IMPLANTS:  ?Cemented Zimmer persona, 6 narrow femur, D tibia, 32 mm patella, 5m MC poly ?  ?PREOPERATIVE INDICATIONS: ? ?Maria Spencer a 76y.o. year old female with end stage bone on bone degenerative arthritis of the knee who failed conservative treatment, including injections, antiinflammatories, activity modification, and assistive devices, and had significant impairment of their activities of daily living, and elected for Total Knee Arthroplasty.  ? ?The risks, benefits, and alternatives were discussed at length including but not limited to the risks of infection, bleeding, nerve injury, stiffness, blood clots, the need for revision surgery, cardiopulmonary complications, among others, and they were willing to proceed. ? ?OPERATIVE FINDINGS AND UNIQUE ASPECTS OF THE CASE: Valgus deformity requiring release of the IT band laterally to balance the knee in extension. ? ?ESTIMATED BLOOD LOSS: 25cc ? ?OPERATIVE DESCRIPTION: ? Once adequate anesthesia, preoperative antibiotics, 2 gm of Ancef,1 gm of Tranexamic Acid, and 8 mg of Decadron administered, the patient was positioned supine with a left thigh tourniquet placed.  The left lower extremity was prepped and draped in sterile fashion.  A time-  out was performed identifying the patient, planned procedure, and the appropriate extremity.  ?   ?The leg was  exsanguinated, tourniquet elevated to 250 mmHg.  A midline incision was  ? made followed by median parapatellar arthrotomy. Anterior horn of the medial  meniscus was released and resected. A medial release was performed, the infrapatellar fat pad was resected with care taken to protect the patellar tendon. The suprapatellar fat was removed to exposed the distal anterior femur. The anterior horn of the lateral meniscus and ACL were released.   ? ?Following initial  exposure, attention was first to the femur.  The femoral  ? canal was opened with a drill, canal was suctioned to try to prevent fat emboli.  An  ? intramedullary rod was passed set at 5 degrees valgus, 10 mm. The distal femur was resected.  Following this resection, the tibia was  ? subluxated anteriorly.  Using the extramedullary guide, 2 mm of bone was resected off  ? the proximal lateral tibia defect.  The extension gap was still too tight for the 10 mm spacer blocks and additional 2 mm was resected on the proximal tibia..  We confirmed the gap would be  ? stable medially and laterally with a size 121mspacer block as well as confirmed that the tibial cut was perpendicular in the coronal plane, checking with an alignment rod.  There was mild relative laxity on the medial compared to the lateral side which we plan to balance later in the case. ? ? Once this was done, the posterior femoral referencing femoral sizer was placed under to the posterior condyles with 3 degrees of external rotational which was parallel to the transepicondylar axis and perpendicular to WhEastman ChemicalThe femur was sized to be a size 6 in the anterior-  posterior dimension. The  anterior, posterior, and  chamfer cuts were made without difficulty nor  ? notching making certain that I was along the anterior cortex to help  ?  with flexion gap stability.  ?Next a laminar spreader was placed with the knee in flexion and the medial lateral menisci were resected.  5 cc of the Exparel mixture was injected in the medial side of the back of the knee and 3 cc in the lateral side.  1/2 inch curved osteotome was used to resect posterior  osteophyte that was then removed with a pituitary rongeur.  ?    ? At this point, the tibia was sized to be a size D.  The size D tray was  ? then pinned in position. Trial reduction was now carried with a 6 femur, ? D tibia, a 10 mm MC insert.  The knee was excessively tight on the lateral side and extension.  Using a 15 blade the IT band was released under direct visualization at the level of the joint line.  We then placed a 12 mm polyethylene liner which had good balance in extension with varus and valgus stress.  The knee was slightly tight in flexion and the PCL was partially released.  ? ?Attention was next directed to the patella.  Precut  measurement was noted to be 22 mm.  I resected down to 13 mm and used a  17m patellar button to restore patellar height as well as cover the cut surface.  ? ? The patella lug holes were drilled and a 32 mm patella poly trial was placed. ? ?The knee was brought to full extension with good flexion stability with the patella tracking through the trochlea without application of pressure.   ? ?Next the femoral component was again assessed and determined to be seated and appropriately lateralized.  The femoral lug holes were drilled.  The femoral component was then removed.Tibial component was again assessed and felt to be seated and appropriately rotated with the medial third of the tubercle. The tibia was then drilled, and keel punched.   ? ? Final components were  opened and regular cement was mixed.   ?   ?Final implants were then  cemented onto cleaned and dried cut surfaces of bone with the knee brought to extension with a 13 mm MC poly.  The knee was irrigated with sterile Betadine diluted in saline as well as pulse lavage normal saline. The synovial lining was  then injected a dilute Exparel.  ?   ? Once the cement had fully cured, excess cement was removed  ? throughout the knee.  I confirmed that I was satisfied with the range of  ? motion and stability, and the final  13 mm MC poly insert was chosen.  It was  ? placed into the knee.  ?   ?   ? The tourniquet had been let down at 71 minutes.  No significant  ? hemostasis was required.  Notably the lateral side was assessed given IT band release and no significant bleeding was appreciated here either.  The medial parapatellar arthrotomy was then reapproximated using #1 Stratafix sutures with the knee  in flexion.  The  ? remaining wound was closed with 0 stratafix, 2-0 Vicryl, and running 3-0 Monocryl.  ? The knee was cleaned, dried, dressed sterilely using Dermabond and  ? Aquacel dressing.  The patient was then  brought to recovery room in stable condition, tolerating the procedure  well. There were no complications. ? ? ?Post op recs: ?WB: WBAT ?Abx: ancef x23 hours post op ?Imaging: PACU xrays ?DVT prophylaxis: Aspirin '81mg'$  BID x4 weeks ?Follow up: 2 weeks after  surgery for a wound check with Dr. Zachery Dakins at [facility].  ?Address: [address] [place], [place], [place] [address]  ?Office Phone: [phone] ? ?Charlies Constable, MD ?Orthopaedic Surgery ? ? ? ? ? ? ? ? ? ? ? ? ?  ?

## 2022-03-18 NOTE — Addendum Note (Signed)
Addended by: Orland Mustard on: 03/18/2022 09:59 AM ? ? Modules accepted: Orders ? ?

## 2022-03-18 NOTE — Anesthesia Procedure Notes (Signed)
Procedure Name: Intubation ?Date/Time: 03/18/2022 10:37 AM ?Performed by: Cleda Daub, CRNA ?Pre-anesthesia Checklist: Patient identified, Emergency Drugs available, Suction available and Patient being monitored ?Patient Re-evaluated:Patient Re-evaluated prior to induction ?Oxygen Delivery Method: Circle system utilized ?Preoxygenation: Pre-oxygenation with 100% oxygen ?Induction Type: IV induction ?Ventilation: Mask ventilation without difficulty ?LMA: LMA inserted ?LMA Size: 4.0 ?Number of attempts: 1 ?Placement Confirmation: positive ETCO2 and breath sounds checked- equal and bilateral ?Tube secured with: Tape ?Dental Injury: Teeth and Oropharynx as per pre-operative assessment  ? ? ? ? ?

## 2022-03-18 NOTE — Transfer of Care (Signed)
Immediate Anesthesia Transfer of Care Note ? ?Patient: Maria Spencer ? ?Procedure(s) Performed: TOTAL KNEE ARTHROPLASTY (Left: Knee) ? ?Patient Location: PACU ? ?Anesthesia Type:GA combined with regional for post-op pain ? ?Level of Consciousness: awake, alert , oriented and patient cooperative ? ?Airway & Oxygen Therapy: Patient Spontanous Breathing and Patient connected to face mask oxygen ? ?Post-op Assessment: Report given to RN and Post -op Vital signs reviewed and stable ? ?Post vital signs: Reviewed and stable ? ?Last Vitals:  ?Vitals Value Taken Time  ?BP 124/75 03/18/22 1300  ?Temp    ?Pulse 89 03/18/22 1301  ?Resp 19 03/18/22 1301  ?SpO2 98 % 03/18/22 1301  ?Vitals shown include unvalidated device data. ? ?Last Pain:  ?Vitals:  ? 03/18/22 0944  ?TempSrc:   ?PainSc: 0-No pain  ?   ? ?Patients Stated Pain Goal: 4 (03/18/22 0933) ? ?Complications: No notable events documented. ?

## 2022-03-18 NOTE — Progress Notes (Signed)
AssistedDr. Roanna Banning with left, adductor canal, ultrasound guided block. Side rails up, monitors on throughout procedure. See vital signs in flow sheet. Tolerated Procedure well. ? ?

## 2022-03-18 NOTE — Discharge Instructions (Signed)
INSTRUCTIONS AFTER JOINT REPLACEMENT  ? ?Remove items at home which could result in a fall. This includes throw rugs or furniture in walking pathways ?ICE to the affected joint every three hours while awake for 30 minutes at a time, for at least the first 3-5 days, and then as needed for pain and swelling.  Continue to use ice for pain and swelling. You may notice swelling that will progress down to the foot and ankle.  This is normal after surgery.  Elevate your leg when you are not up walking on it.   ?Continue to use the breathing machine you got in the hospital (incentive spirometer) which will help keep your temperature down.  It is common for your temperature to cycle up and down following surgery, especially at night when you are not up moving around and exerting yourself.  The breathing machine keeps your lungs expanded and your temperature down. ? ? ?DIET:  As you were doing prior to hospitalization, we recommend a well-balanced diet. ? ?DRESSING / WOUND CARE / SHOWERING ? ?Keep the surgical dressing until follow up.  The dressing is water proof, so you can shower without any extra covering.  IF THE DRESSING FALLS OFF or the wound gets wet inside, change the dressing with sterile gauze.  Please use good hand washing techniques before changing the dressing.  Do not use any lotions or creams on the incision until instructed by your surgeon.   ? ?ACTIVITY ? ?Increase activity slowly as tolerated, but follow the weight bearing instructions below.   ?No driving for 6 weeks or until further direction given by your physician.  You cannot drive while taking narcotics.  ?No lifting or carrying greater than 10 lbs. until further directed by your surgeon. ?Avoid periods of inactivity such as sitting longer than an hour when not asleep. This helps prevent blood clots.  ?You may return to work once you are authorized by your doctor.  ? ? ? ?WEIGHT BEARING  ? ?Weight bearing as tolerated with assist device (walker, cane,  etc) as directed, use it as long as suggested by your surgeon or therapist, typically at least 4-6 weeks. ? ? ?EXERCISES ? ?Results after joint replacement surgery are often greatly improved when you follow the exercise, range of motion and muscle strengthening exercises prescribed by your doctor. Safety measures are also important to protect the joint from further injury. Any time any of these exercises cause you to have increased pain or swelling, decrease what you are doing until you are comfortable again and then slowly increase them. If you have problems or questions, call your caregiver or physical therapist for advice.  ? ?Rehabilitation is important following a joint replacement. After just a few days of immobilization, the muscles of the leg can become weakened and shrink (atrophy).  These exercises are designed to build up the tone and strength of the thigh and leg muscles and to improve motion. Often times heat used for twenty to thirty minutes before working out will loosen up your tissues and help with improving the range of motion but do not use heat for the first two weeks following surgery (sometimes heat can increase post-operative swelling).  ? ?These exercises can be done on a training (exercise) mat, on the floor, on a table or on a bed. Use whatever works the best and is most comfortable for you.    Use music or television while you are exercising so that the exercises are a pleasant break in your   day. This will make your life better with the exercises acting as a break in your routine that you can look forward to.   Perform all exercises about fifteen times, three times per day or as directed.  You should exercise both the operative leg and the other leg as well. ? ?Exercises include: ?  ?Quad Sets - Tighten up the muscle on the front of the thigh (Quad) and hold for 5-10 seconds.   ?Straight Leg Raises - With your knee straight (if you were given a brace, keep it on), lift the leg to 60  degrees, hold for 3 seconds, and slowly lower the leg.  Perform this exercise against resistance later as your leg gets stronger.  ?Leg Slides: Lying on your back, slowly slide your foot toward your buttocks, bending your knee up off the floor (only go as far as is comfortable). Then slowly slide your foot back down until your leg is flat on the floor again.  ?Angel Wings: Lying on your back spread your legs to the side as far apart as you can without causing discomfort.  ?Hamstring Strength:  Lying on your back, push your heel against the floor with your leg straight by tightening up the muscles of your buttocks.  Repeat, but this time bend your knee to a comfortable angle, and push your heel against the floor.  You may put a pillow under the heel to make it more comfortable if necessary.  ? ?A rehabilitation program following joint replacement surgery can speed recovery and prevent re-injury in the future due to weakened muscles. Contact your doctor or a physical therapist for more information on knee rehabilitation.  ? ? ?CONSTIPATION ? ?Constipation is defined medically as fewer than three stools per week and severe constipation as less than one stool per week.  Even if you have a regular bowel pattern at home, your normal regimen is likely to be disrupted due to multiple reasons following surgery.  Combination of anesthesia, postoperative narcotics, change in appetite and fluid intake all can affect your bowels.  ? ?YOU MUST use at least one of the following options; they are listed in order of increasing strength to get the job done.  They are all available over the counter, and you may need to use some, POSSIBLY even all of these options:   ? ?Drink plenty of fluids (prune juice may be helpful) and high fiber foods ?Colace 100 mg by mouth twice a day  ?Senokot for constipation as directed and as needed Dulcolax (bisacodyl), take with full glass of water  ?Miralax (polyethylene glycol) once or twice a day as  needed. ? ?If you have tried all these things and are unable to have a bowel movement in the first 3-4 days after surgery call either your surgeon or your primary doctor.   ? ?If you experience loose stools or diarrhea, hold the medications until you stool forms back up.  If your symptoms do not get better within 1 week or if they get worse, check with your doctor.  If you experience "the worst abdominal pain ever" or develop nausea or vomiting, please contact the office immediately for further recommendations for treatment. ? ? ?ITCHING:  If you experience itching with your medications, try taking only a single pain pill, or even half a pain pill at a time.  You can also use Benadryl over the counter for itching or also to help with sleep.  ? ?TED HOSE STOCKINGS:  Use stockings on both   legs until for at least 2 weeks or as directed by physician office. They may be removed at night for sleeping. ? ?MEDICATIONS:  See your medication summary on the ?After Visit Summary? that nursing will review with you.  You may have some home medications which will be placed on hold until you complete the course of blood thinner medication.  It is important for you to complete the blood thinner medication as prescribed. ? ? ?Blood clot prevention (DVT Prophylaxis): After surgery you are at an increased risk for a blood clot. you were prescribed a blood thinner, Aspirin '81mg'$ , to be taken twice daily for a total of 4 weeks from surgery to help reduce your risk of getting a blood clot. This will help prevent a blood clot. Signs of a pulmonary embolus (blood clot in the lungs) include sudden short of breath, feeling lightheaded or dizzy, chest pain with a deep breath, rapid pulse rapid breathing. Signs of a blood clot in your arms or legs include new unexplained swelling and cramping, warm, red or darkened skin around the painful area. Please call the office or 911 right away if these signs or symptoms develop. ? ?PRECAUTIONS:  If you  experience chest pain or shortness of breath - call 911 immediately for transfer to the hospital emergency department.  ? ?If you develop a fever greater that 101 F, purulent drainage from wound, increased r

## 2022-03-18 NOTE — Interval H&P Note (Signed)
The patient has been re-examined, and the chart reviewed, and there have been no interval changes to the documented history and physical.   ? 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 ? ?The operative side was examined and the patient was confirmed to have. Sens DPN, SPN, TN intact, Motor EHL, ext, flex 5/5, and DP 2+, PT 2+, No significant edema. ? ? ?The risks, benefits, and alternatives have been discussed at length with patient, and the patient is willing to proceed.  Left knee marked. Consent has been signed. ? ?

## 2022-03-18 NOTE — Anesthesia Procedure Notes (Signed)
Anesthesia Regional Block: Adductor canal block  ? ?Pre-Anesthetic Checklist: , timeout performed,  Correct Patient, Correct Site, Correct Laterality,  Correct Procedure,, site marked,  Risks and benefits discussed,  Surgical consent,  Pre-op evaluation,  At surgeon's request and post-op pain management ? ?Laterality: Left ? ?Prep: chloraprep     ?  ?Needles:  ?Injection technique: Single-shot ? ?Needle Type: Echogenic Stimulator Needle   ? ? ?Needle Length: 9cm  ?Needle Gauge: 21  ? ? ? ?Additional Needles: ? ? ?Procedures:,,,, ultrasound used (permanent image in chart),,    ?Narrative:  ?Start time: 03/18/2022 9:35 AM ?End time: 03/18/2022 9:45 AM ?Injection made incrementally with aspirations every 5 mL. ? ?Performed by: Personally  ?Anesthesiologist: Murvin Natal, MD ? ?Additional Notes: ?Functioning IV was confirmed and monitors were applied. A time-out was performed. Hand hygiene and sterile gloves were used. The thigh was placed in a frog-leg position and prepped in a sterile fashion. A 17m 21ga Arrow echogenic stimulator needle was placed using ultrasound guidance.  Negative aspiration and negative test dose prior to incremental administration of local anesthetic. The patient tolerated the procedure well. ? ? ? ? ? ?

## 2022-03-18 NOTE — Anesthesia Postprocedure Evaluation (Signed)
Anesthesia Post Note ? ?Patient: Maria Spencer ? ?Procedure(s) Performed: TOTAL KNEE ARTHROPLASTY (Left: Knee) ? ?  ? ?Patient location during evaluation: PACU ?Anesthesia Type: Regional and General ?Level of consciousness: awake ?Pain management: pain level controlled ?Vital Signs Assessment: post-procedure vital signs reviewed and stable ?Respiratory status: spontaneous breathing, nonlabored ventilation, respiratory function stable and patient connected to nasal cannula oxygen ?Cardiovascular status: blood pressure returned to baseline and stable ?Postop Assessment: no apparent nausea or vomiting ?Anesthetic complications: no ? ? ?No notable events documented. ? ?Last Vitals:  ?Vitals:  ? 03/18/22 1430 03/18/22 1704  ?BP: (!) 144/79 (!) 158/88  ?Pulse: 63 81  ?Resp: 19 18  ?Temp: 36.6 ?C 36.5 ?C  ?SpO2: 98% 97%  ?  ?Last Pain:  ?Vitals:  ? 03/18/22 1752  ?TempSrc:   ?PainSc: 6   ? ? ?  ?  ?  ?  ?  ?  ? ?Maria Spencer ? ? ? ? ?

## 2022-03-18 NOTE — Evaluation (Signed)
Physical Therapy Evaluation ?Patient Details ?Name: Maria Spencer ?MRN: [id number] ?DOB: 1946/02/27 ?Today's Date: 03/18/2022 ? ?History of Present Illness ? Pt is a 76yo female presenting s/p L-TKA on 03/18/22. PMH: OA, hx of breast cancer, HLD, HTN, OP, hypothyroidism, spinal fusion, R gluteus medius repair  ?Clinical Impression ? Maria Spencer is a 76 y.o. female POD 0 s/p L-TKA. Patient reports modified independence with mobility at baseline. Patient is now limited by functional impairments (see PT problem list below) and requires min assist for transfers. Upon sitting EOB pt nauseated and dizzy, VSS, further mobility deferred. Patient instructed in exercise to facilitate ROM and circulation to manage edema. Patient will benefit from continued skilled PT interventions to address impairments and progress towards PLOF. Acute PT will follow to progress mobility in preparation for safe discharge home. ?   ?   ? ?Recommendations for follow up therapy are one component of a multi-disciplinary discharge planning process, led by the attending physician.  Recommendations may be updated based on patient status, additional functional criteria and insurance authorization. ? ?Follow Up Recommendations Follow physician's recommendations for discharge plan and follow up therapies ? ?  ?Assistance Recommended at Discharge Intermittent Supervision/Assistance  ?Patient can return home with the following ? A little help with walking and/or transfers;A little help with bathing/dressing/bathroom;Assistance with cooking/housework;Assist for transportation;Help with stairs or ramp for entrance ? ?  ?Equipment Recommendations    ?Recommendations for Other Services ?    ?  ?Functional Status Assessment Patient has had a recent decline in their functional status and demonstrates the ability to make significant improvements in function in a reasonable and predictable amount of time.  ? ?  ?Precautions / Restrictions  Precautions ?Precautions: Fall ?Restrictions ?Weight Bearing Restrictions: No ?Other Position/Activity Restrictions: WBAT  ? ?  ? ?Mobility ? Bed Mobility ?Overal bed mobility: Needs Assistance ?Bed Mobility: Supine to Sit ?  ?  ?Supine to sit: Min assist ?  ?  ?General bed mobility comments: Pt min assist for trunk elevation and bringing L knee off bed, VCs for use of bed rails. Upon sitting EOB, pt reporting dizziness and nausea. VSS. Pt requesting to transfer to bedside commode. ?  ? ?Transfers ?Overall transfer level: Needs assistance ?Equipment used: Rolling walker (2 wheels) ?Transfers: Sit to/from Stand, Bed to chair/wheelchair/BSC ?Sit to Stand: Min assist ?  ?Step pivot transfers: Min guard ?  ?  ?  ?General transfer comment: STS: min assist for steadying of RW, VCs for sequencing and using BUE to power up. Step Pivot: min guard for safety only, no physical assist required. No increase in dizziness or nausea. ?  ? ?Ambulation/Gait ?  ?  ?  ?  ?  ?  ?  ?General Gait Details: deferred ? ?Stairs ?  ?  ?  ?  ?  ? ?Wheelchair Mobility ?  ? ?Modified Rankin (Stroke Patients Only) ?  ? ?  ? ?Balance Overall balance assessment: Needs assistance ?Sitting-balance support: Feet supported, No upper extremity supported ?Sitting balance-Leahy Scale: Fair ?  ?  ?Standing balance support: Reliant on assistive device for balance, During functional activity, Bilateral upper extremity supported ?Standing balance-Leahy Scale: Poor ?  ?  ?  ?  ?  ?  ?  ?  ?  ?  ?  ?  ?   ? ? ? ?Pertinent Vitals/Pain Pain Assessment ?Pain Assessment: No/denies pain  ? ? ?Home Living Family/patient expects to be discharged to:: Private residence ?Living Arrangements: Alone ?Available Help at Discharge: Family;Available  24 hours/day (Son will be with her) ?Type of Home: House ?Home Access: Level entry ?  ?  ?  ?Home Layout: One level ?Home Equipment: Rolling Walker (2 wheels);BSC/3in1;Shower seat;Cane - single point ?   ?  ?Prior Function Prior  Level of Function : Independent/Modified Independent ?  ?  ?  ?  ?  ?  ?Mobility Comments: SPC in community ?ADLs Comments: IND ?  ? ? ?Hand Dominance  ? Dominant Hand: Right ? ?  ?Extremity/Trunk Assessment  ? Upper Extremity Assessment ?Upper Extremity Assessment: Overall WFL for tasks assessed ?  ? ?Lower Extremity Assessment ?Lower Extremity Assessment: RLE deficits/detail;LLE deficits/detail ?RLE Deficits / Details: MMT ank pf/df 5/5, no extensor lag noted. Pt has prior glute tendon repair and reports decreased strength in this hip (not formally tested). ?RLE Sensation: WNL ?LLE Deficits / Details: MMT ank pf/df 5/5 ?LLE Sensation: WNL ?  ? ?Cervical / Trunk Assessment ?Cervical / Trunk Assessment: Back Surgery  ?Communication  ? Communication: No difficulties  ?Cognition Arousal/Alertness: Awake/alert ?Behavior During Therapy: [facility] for tasks assessed/performed ?Overall Cognitive Status: Within Functional Limits for tasks assessed ?  ?  ?  ?  ?  ?  ?  ?  ?  ?  ?  ?  ?  ?  ?  ?  ?  ?  ?  ? ?  ?General Comments   ? ?  ?Exercises Total Joint Exercises ?Ankle Circles/Pumps: AROM, Both, 20 reps  ? ?Assessment/Plan  ?  ?PT Assessment Patient needs continued PT services  ?PT Problem List Decreased strength;Decreased range of motion;Decreased activity tolerance;Decreased balance;Decreased mobility;Decreased coordination;Decreased knowledge of use of DME;Pain ? ?   ?  ?PT Treatment Interventions DME instruction;Gait training;Stair training;Functional mobility training;Therapeutic activities;Therapeutic exercise;Balance training;Neuromuscular re-education;Patient/family education   ? ?PT Goals (Current goals can be found in the Care Plan section)  ?Acute Rehab PT Goals ?Patient Stated Goal: walk without cane ?PT Goal Formulation: With patient ?Time For Goal Achievement: 03/25/22 ?Potential to Achieve Goals: Good ? ?  ?Frequency 7X/week ?  ? ? ?Co-evaluation   ?  ?  ?  ?  ? ? ?  ?AM-PAC PT "6 Clicks" Mobility  ?Outcome  Measure Help needed turning from your back to your side while in a flat bed without using bedrails?: None ?Help needed moving from lying on your back to sitting on the side of a flat bed without using bedrails?: A Little ?Help needed moving to and from a bed to a chair (including a wheelchair)?: A Little ?Help needed standing up from a chair using your arms (e.g., wheelchair or bedside chair)?: A Little ?Help needed to walk in hospital room?: A Little ?Help needed climbing 3-5 steps with a railing? : A Little ?6 Click Score: 19 ? ?  ?End of Session Equipment Utilized During Treatment: Gait belt ?Activity Tolerance: Patient tolerated treatment well;Other (comment) (Pt nauseated and dizzy) ?Patient left: in chair;with call bell/phone within reach;with chair alarm set ?Nurse Communication: Mobility status ?PT Visit Diagnosis: Pain;Difficulty in walking, not elsewhere classified (R26.2) ?Pain - Right/Left: Left ?Pain - part of body: Knee ?  ? ?Time: 0175-1025 ?PT Time Calculation (min) (ACUTE ONLY): 23 min ? ? ?Charges:   PT Evaluation ?$PT Eval Low Complexity: 1 Low ?PT Treatments ?$Therapeutic Activity: 8-22 mins ?  ?   ? ?Coolidge Breeze, PT, DPT ?[facility] ?Office: [phone] ?Pager: [phone] ? ? ?Coolidge Breeze ?03/18/2022, 6:00 PM ? ?

## 2022-03-18 NOTE — Anesthesia Preprocedure Evaluation (Addendum)
Anesthesia Evaluation  ?Patient identified by MRN, date of birth, ID band ?Patient awake ? ? ? ?Reviewed: ?Allergy & Precautions, NPO status , Patient's Chart, lab work & pertinent test results ? ?Airway ?Mallampati: II ? ?TM Distance: >3 FB ?Neck ROM: Full ? ? ? Dental ?no notable dental hx. ? ?  ?Pulmonary ?neg pulmonary ROS,  ?  ?Pulmonary exam normal ? ? ? ? ? ? ? Cardiovascular ?hypertension, Pt. on home beta blockers and Pt. on medications ?Normal cardiovascular exam ? ? ?  ?Neuro/Psych ? Headaches,  Neuromuscular disease negative psych ROS  ? GI/Hepatic ?negative GI ROS, Neg liver ROS,   ?Endo/Other  ?Hypothyroidism  ? Renal/GU ?negative Renal ROS  ? ?  ?Musculoskeletal ? ?(+) Arthritis , Osteoarthritis,  Spinal fusion x 2 ?Scoliosis surgery  ? Abdominal ?  ?Peds ? Hematology ?negative hematology ROS ?(+)   ?Anesthesia Other Findings ?OA LEFT KNEE ? Reproductive/Obstetrics ? ?  ? ? ? ? ? ? ? ? ? ? ? ? ? ?  ?  ? ? ? ? ? ? ? ?Anesthesia Physical ?Anesthesia Plan ? ?ASA: 2 ? ?Anesthesia Plan: General and Regional  ? ?Post-op Pain Management:   ? ?Induction: Intravenous ? ?PONV Risk Score and Plan: 3 and Ondansetron, Dexamethasone and Treatment may vary due to age or medical condition ? ?Airway Management Planned: LMA ? ?Additional Equipment:  ? ?Intra-op Plan:  ? ?Post-operative Plan: Extubation in OR ? ?Informed Consent: I have reviewed the patients History and Physical, chart, labs and discussed the procedure including the risks, benefits and alternatives for the proposed anesthesia with the patient or authorized representative who has indicated his/her understanding and acceptance.  ? ? ? ?Dental advisory given ? ?Plan Discussed with: CRNA ? ?Anesthesia Plan Comments:   ? ? ? ? ? ? ?Anesthesia Quick Evaluation ? ?

## 2022-03-18 NOTE — Progress Notes (Signed)
Orthopedic Tech Progress Note ?Patient Details:  ?Maria Spencer ?1946-03-02 ?[id number] ? ?Ortho Devices ?Type of Ortho Device: Bone foam zero knee ?Ortho Device/Splint Interventions: Application ?  ?Post Interventions ?Patient Tolerated: Well ?Instructions Provided: Care of device ? ?[name] ?03/18/2022, 12:49 PM ? ?

## 2022-03-19 ENCOUNTER — Encounter (HOSPITAL_COMMUNITY): Payer: Self-pay | Admitting: Orthopedic Surgery

## 2022-03-19 DIAGNOSIS — E039 Hypothyroidism, unspecified: Secondary | ICD-10-CM | POA: Diagnosis not present

## 2022-03-19 DIAGNOSIS — M1712 Unilateral primary osteoarthritis, left knee: Secondary | ICD-10-CM | POA: Diagnosis not present

## 2022-03-19 DIAGNOSIS — I10 Essential (primary) hypertension: Secondary | ICD-10-CM | POA: Diagnosis not present

## 2022-03-19 DIAGNOSIS — M21062 Valgus deformity, not elsewhere classified, left knee: Secondary | ICD-10-CM | POA: Diagnosis not present

## 2022-03-19 LAB — CBC
HCT: 35.7 % — ABNORMAL LOW (ref 36.0–46.0)
Hemoglobin: 11.2 g/dL — ABNORMAL LOW (ref 12.0–15.0)
MCH: 28.6 pg (ref 26.0–34.0)
MCHC: 31.4 g/dL (ref 30.0–36.0)
MCV: 91.1 fL (ref 80.0–100.0)
Platelets: 190 10*3/uL (ref 150–400)
RBC: 3.92 MIL/uL (ref 3.87–5.11)
RDW: 14.6 % (ref 11.5–15.5)
WBC: 8.2 10*3/uL (ref 4.0–10.5)
nRBC: 0 % (ref 0.0–0.2)

## 2022-03-19 LAB — BASIC METABOLIC PANEL
Anion gap: 8 (ref 5–15)
BUN: 12 mg/dL (ref 8–23)
CO2: 24 mmol/L (ref 22–32)
Calcium: 8.4 mg/dL — ABNORMAL LOW (ref 8.9–10.3)
Chloride: 104 mmol/L (ref 98–111)
Creatinine, Ser: 0.49 mg/dL (ref 0.44–1.00)
GFR, Estimated: >60 mL/min (ref >60)
Glucose, Bld: 157 mg/dL — ABNORMAL HIGH (ref 70–99)
Potassium: 3.9 mmol/L (ref 3.5–5.1)
Sodium: 136 mmol/L (ref 135–145)

## 2022-03-19 MED ORDER — METHOCARBAMOL 500 MG PO TABS
500.0000 mg | ORAL_TABLET | Freq: Three times a day (TID) | ORAL | 0 refills | Status: AC | PRN
Start: 2022-03-19 — End: 2022-03-29

## 2022-03-19 MED ORDER — ACETAMINOPHEN 500 MG PO TABS: 1000.0000 mg | ORAL_TABLET | Freq: Three times a day (TID) | ORAL | 0 refills | Status: AC | PRN

## 2022-03-19 MED ORDER — OXYCODONE HCL 5 MG PO TABS: 5.0000 mg | ORAL_TABLET | ORAL | 0 refills | Status: AC | PRN

## 2022-03-19 MED ORDER — TELMISARTAN 80 MG PO TABS: 80.0000 mg | ORAL_TABLET | Freq: Every day | ORAL | 0 refills | Status: DC

## 2022-03-19 MED ORDER — ATORVASTATIN CALCIUM 20 MG PO TABS: 20.0000 mg | ORAL_TABLET | Freq: Every day | ORAL | 0 refills | Status: DC

## 2022-03-19 MED ORDER — ASPIRIN EC 81 MG PO TBEC: 81.0000 mg | DELAYED_RELEASE_TABLET | Freq: Two times a day (BID) | ORAL | 0 refills | Status: AC

## 2022-03-19 MED ORDER — ONDANSETRON HCL 4 MG PO TABS: 4.0000 mg | ORAL_TABLET | Freq: Three times a day (TID) | ORAL | 0 refills | Status: AC | PRN

## 2022-03-19 NOTE — Progress Notes (Signed)
? ? ? ?  Subjective: ? ?Patient reports pain as mild. Worked well with PT yesterday. Pain well controlled overnight. Denies distal n/t. Got up overnight couple times to go to bathroom without issue. Hopeful for dc home today pending PT clearance. ? ?Objective:  ? ?VITALS:   ?Vitals:  ? 03/18/22 1704 03/18/22 2112 03/19/22 0127 03/19/22 0520  ?BP: (!) 158/88 (!) 153/79 140/76 (!) 145/77  ?Pulse: 81 70 82 85  ?Resp: '18 16 16 16  '$ ?Temp: 97.7 ?F (36.5 ?C) 98.1 ?F (36.7 ?C) 97.9 ?F (36.6 ?C) 98.1 ?F (36.7 ?C)  ?TempSrc: Axillary Oral Oral Oral  ?SpO2: 97% 100% 98% 96%  ?Weight:      ?Height:      ? ? ?Sensation intact distally ?Intact pulses distally ?Dorsiflexion/Plantar flexion intact ?Incision: dressing C/D/I ?Compartment soft ? ? ?Lab Results  ?Component Value Date  ? WBC 8.2 03/19/2022  ? HGB 11.2 (L) 03/19/2022  ? HCT 35.7 (L) 03/19/2022  ? MCV 91.1 03/19/2022  ? PLT 190 03/19/2022  ? ?BMET ?   ?Component Value Date/Time  ? NA 136 03/19/2022 0316  ? K 3.9 03/19/2022 0316  ? CL 104 03/19/2022 0316  ? CO2 24 03/19/2022 0316  ? GLUCOSE 157 (H) 03/19/2022 0316  ? BUN 12 03/19/2022 0316  ? CREATININE 0.49 03/19/2022 0316  ? CREATININE 0.57 (L) 09/10/2019 1456  ? CALCIUM 8.4 (L) 03/19/2022 0316  ? GFRNONAA >60 03/19/2022 0316  ? ? ? ? ?Xray: post op xrays show TKA in good position without adverse features ? ?Assessment/Plan: ?1 Day Post-Op  ? ?Principal Problem: ?  Localized osteoarthritis of left knee ? ?L TKA 4/24 ? ?Post op recs: ?WB: WBAT ?Abx: ancef x23 hours post op ?Imaging: PACU xrays ?DVT prophylaxis: Aspirin '81mg'$  BID x4 weeks ?Follow up: 2 weeks after surgery for a wound check with Dr. Zachery Dakins at [facility].  ?Address: [address] [place], [place], [place] [address]  ?Office Phone: [phone] ?  ?Charlies Constable, MD ?Orthopaedic Surgery ? ? ? ?Khi Mcmillen A Ladarryl Wrage ?03/19/2022, 6:27 AM ? ? ?Charlies Constable, MD ? ?Contact information:   ?Weekdays 7am-5pm epic message Dr. Zachery Dakins, or  call office for patient follow up: [phone] ?After hours and holidays please check [email or web address] for group call information for [facility] ? ?  ?

## 2022-03-19 NOTE — Plan of Care (Signed)
Problem: Education: ?Goal: Knowledge of the prescribed therapeutic regimen will improve ?Outcome: Progressing ?  ?Problem: Activity: ?Goal: Ability to avoid complications of mobility impairment will improve ?Outcome: Progressing ?  ?Problem: Pain Management: ?Goal: Pain level will decrease with appropriate interventions ?Outcome: Progressing ?  ?Ivan Anchors, RN ?03/19/22 ?10:01 AM ? ?

## 2022-03-19 NOTE — Progress Notes (Signed)
Physical Therapy Treatment ?Patient Details ?Name: Maria Spencer ?MRN: [id number] ?DOB: [birth year] ?Today's Date: 03/19/2022 ? ? ?History of Present Illness Pt is a 75yo female presenting s/p L-TKA on 03/18/22. PMH: OA, hx of breast cancer, HLD, HTN, OP, hypothyroidism, spinal fusion, R gluteus medius repair ? ?  ?PT Comments  ? ? POD # 1 am session ?Pt feeling "much better".  General bed mobility comments: demonstarted and instructed how to use a belt to self assist le although did not need a she was able to perform SLR.  General transfer comment: just one VC on safety with turns and hand placement with stand to sit.  Also assisted with a toilet transfer. General Gait Details: No c/o dizzyness today.  Tolerated a functional distance in hallway.  Then returned to room to perform some TE's following HEP handout.  Instructed on proper tech, freq as well as use of ICE.   ?Addressed all mobility questions, discussed appropriate activity, educated on use of ICE.  Pt ready for D/C to home. ?  ?Recommendations for follow up therapy are one component of a multi-disciplinary discharge planning process, led by the attending physician.  Recommendations may be updated based on patient status, additional functional criteria and insurance authorization. ? ?Follow Up Recommendations ? Follow physician's recommendations for discharge plan and follow up therapies ?  ?  ?Assistance Recommended at Discharge Intermittent Supervision/Assistance  ?Patient can return home with the following A little help with walking and/or transfers;A little help with bathing/dressing/bathroom;Assistance with cooking/housework;Assist for transportation;Help with stairs or ramp for entrance ?  ?Equipment Recommendations ?    ?  ?Recommendations for Other Services   ? ? ?  ?Precautions / Restrictions Precautions ?Precautions: Fall ?Precaution Comments: instructed no pillow under knee ?Restrictions ?Weight Bearing Restrictions: No ?Other Position/Activity  Restrictions: WBAT  ?  ? ?Mobility ? Bed Mobility ?Overal bed mobility: Needs Assistance ?Bed Mobility: Supine to Sit ?  ?  ?Supine to sit: Supervision, Min guard ?  ?  ?General bed mobility comments: demonstarted and instructed how to use a belt to self assist le although did not need a she was able to perform SLR ?  ? ?Transfers ?Overall transfer level: Needs assistance ?Equipment used: Rolling walker (2 wheels) ?Transfers: Sit to/from Stand, Bed to chair/wheelchair/BSC ?Sit to Stand: Supervision, Min guard ?  ?  ?  ?  ?  ?General transfer comment: just one VC on safety with turns and hand placement with stand to sit.  Also assisted with a toilet transfer. ?  ? ?Ambulation/Gait ?Ambulation/Gait assistance: Supervision, Min guard ?Gait Distance (Feet): 55 Feet ?Assistive device: Rolling walker (2 wheels) ?Gait Pattern/deviations: Step-through pattern, Decreased stance time - left ?Gait velocity: decreased ?  ?  ?General Gait Details: No c/o dizzyness today.  Tolerated a functional distance in hallway ? ? ?Stairs ?  ?  ?  ?  ?  ? ? ?Wheelchair Mobility ?  ? ?Modified Rankin (Stroke Patients Only) ?  ? ? ?  ?Balance   ?  ?  ?  ?  ?  ?  ?  ?  ?  ?  ?  ?  ?  ?  ?  ?  ?  ?  ?  ? ?  ?Cognition Arousal/Alertness: Awake/alert ?Behavior During Therapy: [facility] for tasks assessed/performed ?Overall Cognitive Status: Within Functional Limits for tasks assessed ?  ?  ?  ?  ?  ?  ?  ?  ?  ?  ?  ?  ?  ?  ?  ?  ?  General Comments: AxO x 3 very pleasant and motivated ?  ?  ? ?  ?Exercises  Total Knee Replacement TE's following HEP handout ?10 reps B LE ankle pumps ?05 reps towel squeezes ?05 reps knee presses ?05 reps heel slides  ?05 reps SAQ's ?05 reps SLR's ?05 reps ABD ?Educated on use of gait belt to assist with TE's ?Followed by ICE ? ? ?  ?General Comments   ?  ?  ? ?Pertinent Vitals/Pain Pain Assessment ?Pain Assessment: 0-10 ?Pain Score: 5  ?Pain Location: L knee ?Pain Descriptors / Indicators: Discomfort, Sore, Operative site  guarding ?Pain Intervention(s): Monitored during session, Premedicated before session, Repositioned, Ice applied  ? ? ?Home Living   ?  ?  ?  ?  ?  ?  ?  ?  ?  ?   ?  ?Prior Function    ?  ?  ?   ? ?PT Goals (current goals can now be found in the care plan section) Progress towards PT goals: Progressing toward goals ? ?  ?Frequency ? ? ? 7X/week ? ? ? ?  ?PT Plan Current plan remains appropriate  ? ? ?Co-evaluation   ?  ?  ?  ?  ? ?  ?AM-PAC PT "6 Clicks" Mobility   ?Outcome Measure ? Help needed turning from your back to your side while in a flat bed without using bedrails?: A Little ?Help needed moving from lying on your back to sitting on the side of a flat bed without using bedrails?: A Little ?Help needed moving to and from a bed to a chair (including a wheelchair)?: A Little ?Help needed standing up from a chair using your arms (e.g., wheelchair or bedside chair)?: A Little ?Help needed to walk in hospital room?: A Little ?Help needed climbing 3-5 steps with a railing? : A Little ?6 Click Score: 18 ? ?  ?End of Session Equipment Utilized During Treatment: Gait belt ?Activity Tolerance: Patient tolerated treatment well ?Patient left: in chair;with call bell/phone within reach;with chair alarm set ?Nurse Communication: Mobility status ?PT Visit Diagnosis: Pain;Difficulty in walking, not elsewhere classified (R26.2) ?Pain - Right/Left: Left ?Pain - part of body: Knee ?  ? ? ?Time: 8177-1165 ?PT Time Calculation (min) (ACUTE ONLY): 35 min ? ?Charges:  $Gait Training: 8-22 mins ?$Therapeutic Exercise: 8-22 mins          ?          ? ?Rica Koyanagi  PTA ?Acute  Rehabilitation Services ?Pager      [phone] ?Office      [phone] ? ?

## 2022-03-19 NOTE — TOC Transition Note (Signed)
Transition of Care (TOC) - CM/SW Discharge Note ? ? ?Patient Details  ?Name: Maria Spencer ?MRN: [id number] ?Date of Birth: [birth year] ? ?Transition of Care (TOC) CM/SW Contact:  ?Shianne Zeiser, LCSW ?Phone Number: ?03/19/2022, 9:57 AM ? ? ?Clinical Narrative:    ?Met with pt who confirms she has all needed DME at home.  Pt notes HHPT prearranged with [facility] via MD office.  No TOC needs. ? ? ?Final next level of care: [place] ?Barriers to Discharge: No Barriers Identified ? ? ?Patient Goals and CMS Choice ?Patient states their goals for this hospitalization and ongoing recovery are:: return home ?  ?  ? ?Discharge Placement ?  ?           ?  ?  ?  ?  ? ?Discharge Plan and Services ?  ?  ?           ?DME Arranged: N/A ?DME Agency: NA ?  ?  ?  ?HH Arranged: PT ?[place] Agency: [place] ?  ?  ?  ? ?Social Determinants of Health (SDOH) Interventions ?  ? ? ?Readmission Risk Interventions ?   ? View : No data to display.  ?  ?  ?  ? ? ? ? ? ?

## 2022-03-19 NOTE — Plan of Care (Signed)
Patient discharged home with son via private vehicle.  ?Ivan Anchors, RN ?03/19/22 ?12:46 PM ? ?

## 2022-03-20 DIAGNOSIS — M47814 Spondylosis without myelopathy or radiculopathy, thoracic region: Secondary | ICD-10-CM | POA: Diagnosis not present

## 2022-03-20 DIAGNOSIS — I10 Essential (primary) hypertension: Secondary | ICD-10-CM | POA: Diagnosis not present

## 2022-03-20 DIAGNOSIS — E039 Hypothyroidism, unspecified: Secondary | ICD-10-CM | POA: Diagnosis not present

## 2022-03-20 DIAGNOSIS — M4727 Other spondylosis with radiculopathy, lumbosacral region: Secondary | ICD-10-CM | POA: Diagnosis not present

## 2022-03-20 DIAGNOSIS — R7303 Prediabetes: Secondary | ICD-10-CM | POA: Diagnosis not present

## 2022-03-20 DIAGNOSIS — K5790 Diverticulosis of intestine, part unspecified, without perforation or abscess without bleeding: Secondary | ICD-10-CM | POA: Diagnosis not present

## 2022-03-20 DIAGNOSIS — Z87440 Personal history of urinary (tract) infections: Secondary | ICD-10-CM | POA: Diagnosis not present

## 2022-03-20 DIAGNOSIS — Z853 Personal history of malignant neoplasm of breast: Secondary | ICD-10-CM | POA: Diagnosis not present

## 2022-03-20 DIAGNOSIS — Z96652 Presence of left artificial knee joint: Secondary | ICD-10-CM | POA: Diagnosis not present

## 2022-03-20 DIAGNOSIS — M4185 Other forms of scoliosis, thoracolumbar region: Secondary | ICD-10-CM | POA: Diagnosis not present

## 2022-03-20 DIAGNOSIS — Z20822 Contact with and (suspected) exposure to covid-19: Secondary | ICD-10-CM | POA: Diagnosis not present

## 2022-03-20 DIAGNOSIS — G894 Chronic pain syndrome: Secondary | ICD-10-CM | POA: Diagnosis not present

## 2022-03-20 DIAGNOSIS — K589 Irritable bowel syndrome without diarrhea: Secondary | ICD-10-CM | POA: Diagnosis not present

## 2022-03-20 DIAGNOSIS — Z471 Aftercare following joint replacement surgery: Secondary | ICD-10-CM | POA: Diagnosis not present

## 2022-03-20 DIAGNOSIS — Z96611 Presence of right artificial shoulder joint: Secondary | ICD-10-CM | POA: Diagnosis not present

## 2022-03-20 DIAGNOSIS — M5124 Other intervertebral disc displacement, thoracic region: Secondary | ICD-10-CM | POA: Diagnosis not present

## 2022-03-20 DIAGNOSIS — M810 Age-related osteoporosis without current pathological fracture: Secondary | ICD-10-CM | POA: Diagnosis not present

## 2022-03-20 DIAGNOSIS — M47812 Spondylosis without myelopathy or radiculopathy, cervical region: Secondary | ICD-10-CM | POA: Diagnosis not present

## 2022-03-20 DIAGNOSIS — E785 Hyperlipidemia, unspecified: Secondary | ICD-10-CM | POA: Diagnosis not present

## 2022-03-20 DIAGNOSIS — M5030 Other cervical disc degeneration, unspecified cervical region: Secondary | ICD-10-CM | POA: Diagnosis not present

## 2022-03-20 DIAGNOSIS — K219 Gastro-esophageal reflux disease without esophagitis: Secondary | ICD-10-CM | POA: Diagnosis not present

## 2022-03-20 DIAGNOSIS — I700 Atherosclerosis of aorta: Secondary | ICD-10-CM | POA: Diagnosis not present

## 2022-03-22 DIAGNOSIS — M47812 Spondylosis without myelopathy or radiculopathy, cervical region: Secondary | ICD-10-CM | POA: Diagnosis not present

## 2022-03-22 DIAGNOSIS — M4185 Other forms of scoliosis, thoracolumbar region: Secondary | ICD-10-CM | POA: Diagnosis not present

## 2022-03-22 DIAGNOSIS — Z471 Aftercare following joint replacement surgery: Secondary | ICD-10-CM | POA: Diagnosis not present

## 2022-03-22 DIAGNOSIS — M4727 Other spondylosis with radiculopathy, lumbosacral region: Secondary | ICD-10-CM | POA: Diagnosis not present

## 2022-03-22 DIAGNOSIS — M5124 Other intervertebral disc displacement, thoracic region: Secondary | ICD-10-CM | POA: Diagnosis not present

## 2022-03-22 DIAGNOSIS — E039 Hypothyroidism, unspecified: Secondary | ICD-10-CM | POA: Diagnosis not present

## 2022-03-23 DIAGNOSIS — Z20822 Contact with and (suspected) exposure to covid-19: Secondary | ICD-10-CM | POA: Diagnosis not present

## 2022-03-25 DIAGNOSIS — M5124 Other intervertebral disc displacement, thoracic region: Secondary | ICD-10-CM | POA: Diagnosis not present

## 2022-03-25 DIAGNOSIS — M47812 Spondylosis without myelopathy or radiculopathy, cervical region: Secondary | ICD-10-CM | POA: Diagnosis not present

## 2022-03-25 DIAGNOSIS — Z471 Aftercare following joint replacement surgery: Secondary | ICD-10-CM | POA: Diagnosis not present

## 2022-03-25 DIAGNOSIS — E039 Hypothyroidism, unspecified: Secondary | ICD-10-CM | POA: Diagnosis not present

## 2022-03-25 DIAGNOSIS — M4727 Other spondylosis with radiculopathy, lumbosacral region: Secondary | ICD-10-CM | POA: Diagnosis not present

## 2022-03-25 DIAGNOSIS — M4185 Other forms of scoliosis, thoracolumbar region: Secondary | ICD-10-CM | POA: Diagnosis not present

## 2022-03-27 DIAGNOSIS — M4727 Other spondylosis with radiculopathy, lumbosacral region: Secondary | ICD-10-CM | POA: Diagnosis not present

## 2022-03-27 DIAGNOSIS — E039 Hypothyroidism, unspecified: Secondary | ICD-10-CM | POA: Diagnosis not present

## 2022-03-27 DIAGNOSIS — M5124 Other intervertebral disc displacement, thoracic region: Secondary | ICD-10-CM | POA: Diagnosis not present

## 2022-03-27 DIAGNOSIS — M4185 Other forms of scoliosis, thoracolumbar region: Secondary | ICD-10-CM | POA: Diagnosis not present

## 2022-03-27 DIAGNOSIS — M47812 Spondylosis without myelopathy or radiculopathy, cervical region: Secondary | ICD-10-CM | POA: Diagnosis not present

## 2022-03-27 DIAGNOSIS — Z471 Aftercare following joint replacement surgery: Secondary | ICD-10-CM | POA: Diagnosis not present

## 2022-03-29 DIAGNOSIS — Z471 Aftercare following joint replacement surgery: Secondary | ICD-10-CM | POA: Diagnosis not present

## 2022-03-29 DIAGNOSIS — M4727 Other spondylosis with radiculopathy, lumbosacral region: Secondary | ICD-10-CM | POA: Diagnosis not present

## 2022-03-29 DIAGNOSIS — M4185 Other forms of scoliosis, thoracolumbar region: Secondary | ICD-10-CM | POA: Diagnosis not present

## 2022-03-29 DIAGNOSIS — E039 Hypothyroidism, unspecified: Secondary | ICD-10-CM | POA: Diagnosis not present

## 2022-03-29 DIAGNOSIS — Z20822 Contact with and (suspected) exposure to covid-19: Secondary | ICD-10-CM | POA: Diagnosis not present

## 2022-03-29 DIAGNOSIS — M5124 Other intervertebral disc displacement, thoracic region: Secondary | ICD-10-CM | POA: Diagnosis not present

## 2022-03-29 DIAGNOSIS — M47812 Spondylosis without myelopathy or radiculopathy, cervical region: Secondary | ICD-10-CM | POA: Diagnosis not present

## 2022-03-31 DIAGNOSIS — Z20822 Contact with and (suspected) exposure to covid-19: Secondary | ICD-10-CM | POA: Diagnosis not present

## 2022-04-01 DIAGNOSIS — M4727 Other spondylosis with radiculopathy, lumbosacral region: Secondary | ICD-10-CM | POA: Diagnosis not present

## 2022-04-01 DIAGNOSIS — M5124 Other intervertebral disc displacement, thoracic region: Secondary | ICD-10-CM | POA: Diagnosis not present

## 2022-04-01 DIAGNOSIS — Z20822 Contact with and (suspected) exposure to covid-19: Secondary | ICD-10-CM | POA: Diagnosis not present

## 2022-04-01 DIAGNOSIS — E039 Hypothyroidism, unspecified: Secondary | ICD-10-CM | POA: Diagnosis not present

## 2022-04-01 DIAGNOSIS — M47812 Spondylosis without myelopathy or radiculopathy, cervical region: Secondary | ICD-10-CM | POA: Diagnosis not present

## 2022-04-01 DIAGNOSIS — Z471 Aftercare following joint replacement surgery: Secondary | ICD-10-CM | POA: Diagnosis not present

## 2022-04-01 DIAGNOSIS — M4185 Other forms of scoliosis, thoracolumbar region: Secondary | ICD-10-CM | POA: Diagnosis not present

## 2022-04-02 DIAGNOSIS — M47812 Spondylosis without myelopathy or radiculopathy, cervical region: Secondary | ICD-10-CM | POA: Diagnosis not present

## 2022-04-02 DIAGNOSIS — M4185 Other forms of scoliosis, thoracolumbar region: Secondary | ICD-10-CM | POA: Diagnosis not present

## 2022-04-02 DIAGNOSIS — E039 Hypothyroidism, unspecified: Secondary | ICD-10-CM | POA: Diagnosis not present

## 2022-04-02 DIAGNOSIS — Z471 Aftercare following joint replacement surgery: Secondary | ICD-10-CM | POA: Diagnosis not present

## 2022-04-02 DIAGNOSIS — M5124 Other intervertebral disc displacement, thoracic region: Secondary | ICD-10-CM | POA: Diagnosis not present

## 2022-04-02 DIAGNOSIS — M4727 Other spondylosis with radiculopathy, lumbosacral region: Secondary | ICD-10-CM | POA: Diagnosis not present

## 2022-04-04 DIAGNOSIS — M1712 Unilateral primary osteoarthritis, left knee: Secondary | ICD-10-CM | POA: Diagnosis not present

## 2022-04-05 DIAGNOSIS — M25662 Stiffness of left knee, not elsewhere classified: Secondary | ICD-10-CM | POA: Diagnosis not present

## 2022-04-05 DIAGNOSIS — R262 Difficulty in walking, not elsewhere classified: Secondary | ICD-10-CM | POA: Diagnosis not present

## 2022-04-05 DIAGNOSIS — M25562 Pain in left knee: Secondary | ICD-10-CM | POA: Diagnosis not present

## 2022-04-08 DIAGNOSIS — R262 Difficulty in walking, not elsewhere classified: Secondary | ICD-10-CM | POA: Diagnosis not present

## 2022-04-08 DIAGNOSIS — M25562 Pain in left knee: Secondary | ICD-10-CM | POA: Diagnosis not present

## 2022-04-08 DIAGNOSIS — M25662 Stiffness of left knee, not elsewhere classified: Secondary | ICD-10-CM | POA: Diagnosis not present

## 2022-04-08 DIAGNOSIS — M1712 Unilateral primary osteoarthritis, left knee: Secondary | ICD-10-CM | POA: Diagnosis not present

## 2022-04-10 DIAGNOSIS — R262 Difficulty in walking, not elsewhere classified: Secondary | ICD-10-CM | POA: Diagnosis not present

## 2022-04-10 DIAGNOSIS — M25662 Stiffness of left knee, not elsewhere classified: Secondary | ICD-10-CM | POA: Diagnosis not present

## 2022-04-10 DIAGNOSIS — M25562 Pain in left knee: Secondary | ICD-10-CM | POA: Diagnosis not present

## 2022-04-12 DIAGNOSIS — M25562 Pain in left knee: Secondary | ICD-10-CM | POA: Diagnosis not present

## 2022-04-12 DIAGNOSIS — R262 Difficulty in walking, not elsewhere classified: Secondary | ICD-10-CM | POA: Diagnosis not present

## 2022-04-12 DIAGNOSIS — M25662 Stiffness of left knee, not elsewhere classified: Secondary | ICD-10-CM | POA: Diagnosis not present

## 2022-04-15 ENCOUNTER — Ambulatory Visit: Payer: Self-pay | Admitting: Urology

## 2022-04-15 DIAGNOSIS — M25662 Stiffness of left knee, not elsewhere classified: Secondary | ICD-10-CM | POA: Diagnosis not present

## 2022-04-15 DIAGNOSIS — M25562 Pain in left knee: Secondary | ICD-10-CM | POA: Diagnosis not present

## 2022-04-15 DIAGNOSIS — R262 Difficulty in walking, not elsewhere classified: Secondary | ICD-10-CM | POA: Diagnosis not present

## 2022-04-17 DIAGNOSIS — R262 Difficulty in walking, not elsewhere classified: Secondary | ICD-10-CM | POA: Diagnosis not present

## 2022-04-17 DIAGNOSIS — M25662 Stiffness of left knee, not elsewhere classified: Secondary | ICD-10-CM | POA: Diagnosis not present

## 2022-04-17 DIAGNOSIS — M25562 Pain in left knee: Secondary | ICD-10-CM | POA: Diagnosis not present

## 2022-04-19 DIAGNOSIS — M25562 Pain in left knee: Secondary | ICD-10-CM | POA: Diagnosis not present

## 2022-04-19 DIAGNOSIS — R262 Difficulty in walking, not elsewhere classified: Secondary | ICD-10-CM | POA: Diagnosis not present

## 2022-04-19 DIAGNOSIS — M25662 Stiffness of left knee, not elsewhere classified: Secondary | ICD-10-CM | POA: Diagnosis not present

## 2022-04-25 DIAGNOSIS — R262 Difficulty in walking, not elsewhere classified: Secondary | ICD-10-CM | POA: Diagnosis not present

## 2022-04-25 DIAGNOSIS — M25662 Stiffness of left knee, not elsewhere classified: Secondary | ICD-10-CM | POA: Diagnosis not present

## 2022-04-25 DIAGNOSIS — M25562 Pain in left knee: Secondary | ICD-10-CM | POA: Diagnosis not present

## 2022-04-29 ENCOUNTER — Other Ambulatory Visit: Payer: Self-pay | Admitting: Internal Medicine

## 2022-04-29 DIAGNOSIS — Z1231 Encounter for screening mammogram for malignant neoplasm of breast: Secondary | ICD-10-CM

## 2022-04-29 DIAGNOSIS — M25662 Stiffness of left knee, not elsewhere classified: Secondary | ICD-10-CM | POA: Diagnosis not present

## 2022-04-29 DIAGNOSIS — R262 Difficulty in walking, not elsewhere classified: Secondary | ICD-10-CM | POA: Diagnosis not present

## 2022-04-29 DIAGNOSIS — M25562 Pain in left knee: Secondary | ICD-10-CM | POA: Diagnosis not present

## 2022-05-01 DIAGNOSIS — R262 Difficulty in walking, not elsewhere classified: Secondary | ICD-10-CM | POA: Diagnosis not present

## 2022-05-01 DIAGNOSIS — M25562 Pain in left knee: Secondary | ICD-10-CM | POA: Diagnosis not present

## 2022-05-01 DIAGNOSIS — M25662 Stiffness of left knee, not elsewhere classified: Secondary | ICD-10-CM | POA: Diagnosis not present

## 2022-05-02 DIAGNOSIS — M1712 Unilateral primary osteoarthritis, left knee: Secondary | ICD-10-CM | POA: Diagnosis not present

## 2022-05-06 DIAGNOSIS — R262 Difficulty in walking, not elsewhere classified: Secondary | ICD-10-CM | POA: Diagnosis not present

## 2022-05-06 DIAGNOSIS — M25562 Pain in left knee: Secondary | ICD-10-CM | POA: Diagnosis not present

## 2022-05-06 DIAGNOSIS — M25662 Stiffness of left knee, not elsewhere classified: Secondary | ICD-10-CM | POA: Diagnosis not present

## 2022-05-06 DIAGNOSIS — Z20828 Contact with and (suspected) exposure to other viral communicable diseases: Secondary | ICD-10-CM | POA: Diagnosis not present

## 2022-05-08 DIAGNOSIS — R262 Difficulty in walking, not elsewhere classified: Secondary | ICD-10-CM | POA: Diagnosis not present

## 2022-05-08 DIAGNOSIS — M25562 Pain in left knee: Secondary | ICD-10-CM | POA: Diagnosis not present

## 2022-05-08 DIAGNOSIS — M25662 Stiffness of left knee, not elsewhere classified: Secondary | ICD-10-CM | POA: Diagnosis not present

## 2022-05-10 DIAGNOSIS — M160 Bilateral primary osteoarthritis of hip: Secondary | ICD-10-CM | POA: Diagnosis not present

## 2022-05-10 DIAGNOSIS — M1612 Unilateral primary osteoarthritis, left hip: Secondary | ICD-10-CM | POA: Diagnosis not present

## 2022-05-10 DIAGNOSIS — M1611 Unilateral primary osteoarthritis, right hip: Secondary | ICD-10-CM | POA: Diagnosis not present

## 2022-05-13 ENCOUNTER — Other Ambulatory Visit: Payer: Self-pay | Admitting: Orthopedic Surgery

## 2022-05-13 DIAGNOSIS — M25662 Stiffness of left knee, not elsewhere classified: Secondary | ICD-10-CM | POA: Diagnosis not present

## 2022-05-13 DIAGNOSIS — M25562 Pain in left knee: Secondary | ICD-10-CM | POA: Diagnosis not present

## 2022-05-13 DIAGNOSIS — M545 Low back pain, unspecified: Secondary | ICD-10-CM

## 2022-05-13 DIAGNOSIS — R262 Difficulty in walking, not elsewhere classified: Secondary | ICD-10-CM | POA: Diagnosis not present

## 2022-05-15 ENCOUNTER — Other Ambulatory Visit: Payer: Self-pay | Admitting: Orthopedic Surgery

## 2022-05-15 DIAGNOSIS — M545 Low back pain, unspecified: Secondary | ICD-10-CM

## 2022-05-16 DIAGNOSIS — M25662 Stiffness of left knee, not elsewhere classified: Secondary | ICD-10-CM | POA: Diagnosis not present

## 2022-05-16 DIAGNOSIS — R262 Difficulty in walking, not elsewhere classified: Secondary | ICD-10-CM | POA: Diagnosis not present

## 2022-05-16 DIAGNOSIS — M25562 Pain in left knee: Secondary | ICD-10-CM | POA: Diagnosis not present

## 2022-05-20 ENCOUNTER — Ambulatory Visit
Admission: RE | Admit: 2022-05-20 | Discharge: 2022-05-20 | Disposition: A | Discharge status: Home / Self Care | Payer: Medicare Other | Source: Ambulatory Visit | Attending: Orthopedic Surgery | Admitting: Orthopedic Surgery

## 2022-05-20 DIAGNOSIS — M545 Low back pain, unspecified: Secondary | ICD-10-CM

## 2022-05-20 DIAGNOSIS — M4186 Other forms of scoliosis, lumbar region: Secondary | ICD-10-CM | POA: Diagnosis not present

## 2022-05-21 ENCOUNTER — Other Ambulatory Visit: Payer: Self-pay

## 2022-05-21 ENCOUNTER — Encounter: Payer: Self-pay | Admitting: Gastroenterology

## 2022-05-21 ENCOUNTER — Ambulatory Visit (INDEPENDENT_AMBULATORY_CARE_PROVIDER_SITE_OTHER): Payer: Medicare Other | Admitting: Gastroenterology

## 2022-05-21 ENCOUNTER — Ambulatory Visit: Payer: Medicare Other | Admitting: Internal Medicine

## 2022-05-21 VITALS — BP 181/102 | HR 79 | Temp 98.1°F | Wt 133.0 lb

## 2022-05-21 DIAGNOSIS — K5733 Diverticulitis of large intestine without perforation or abscess with bleeding: Secondary | ICD-10-CM

## 2022-05-23 ENCOUNTER — Encounter: Payer: Self-pay | Admitting: Internal Medicine

## 2022-05-23 ENCOUNTER — Ambulatory Visit (INDEPENDENT_AMBULATORY_CARE_PROVIDER_SITE_OTHER): Payer: Medicare Other | Admitting: Internal Medicine

## 2022-05-23 VITALS — BP 130/80 | HR 71 | Temp 98.0°F | Resp 14 | Ht 59.0 in | Wt 132.8 lb

## 2022-05-23 DIAGNOSIS — R7303 Prediabetes: Secondary | ICD-10-CM

## 2022-05-23 DIAGNOSIS — G4700 Insomnia, unspecified: Secondary | ICD-10-CM

## 2022-05-23 DIAGNOSIS — Z23 Encounter for immunization: Secondary | ICD-10-CM | POA: Diagnosis not present

## 2022-05-23 DIAGNOSIS — E039 Hypothyroidism, unspecified: Secondary | ICD-10-CM | POA: Diagnosis not present

## 2022-05-23 DIAGNOSIS — I10 Essential (primary) hypertension: Secondary | ICD-10-CM | POA: Diagnosis not present

## 2022-05-23 DIAGNOSIS — D649 Anemia, unspecified: Secondary | ICD-10-CM

## 2022-05-23 DIAGNOSIS — E785 Hyperlipidemia, unspecified: Secondary | ICD-10-CM | POA: Diagnosis not present

## 2022-05-23 LAB — CBC WITH DIFFERENTIAL/PLATELET
Basophils Absolute: 0 10*3/uL (ref 0.0–0.1)
Basophils Relative: 0.6 % (ref 0.0–3.0)
Eosinophils Absolute: 0.2 10*3/uL (ref 0.0–0.7)
Eosinophils Relative: 4.2 % (ref 0.0–5.0)
HCT: 41.4 % (ref 36.0–46.0)
Hemoglobin: 13.4 g/dL (ref 12.0–15.0)
Lymphocytes Relative: 18.9 % (ref 12.0–46.0)
Lymphs Abs: 1 10*3/uL (ref 0.7–4.0)
MCHC: 32.3 g/dL (ref 30.0–36.0)
MCV: 87.1 fl (ref 78.0–100.0)
Monocytes Absolute: 0.3 10*3/uL (ref 0.1–1.0)
Monocytes Relative: 5.8 % (ref 3.0–12.0)
Neutro Abs: 3.9 10*3/uL (ref 1.4–7.7)
Neutrophils Relative %: 70.5 % (ref 43.0–77.0)
Platelets: 227 10*3/uL (ref 150.0–400.0)
RBC: 4.76 Mil/uL (ref 3.87–5.11)
RDW: 15 % (ref 11.5–15.5)
WBC: 5.5 10*3/uL (ref 4.0–10.5)

## 2022-05-23 LAB — IBC + FERRITIN
Ferritin: 50 ng/mL (ref 10.0–291.0)
Iron: 87 ug/dL (ref 42–145)
Saturation Ratios: 21.4 % (ref 20.0–50.0)
TIBC: 407.4 ug/dL (ref 250.0–450.0)
Transferrin: 291 mg/dL (ref 212.0–360.0)

## 2022-05-23 LAB — LIPID PANEL
Cholesterol: 222 mg/dL — ABNORMAL HIGH (ref 0–200)
HDL: 77.2 mg/dL (ref >39.00)
LDL Cholesterol: 116 mg/dL — ABNORMAL HIGH (ref 0–99)
NonHDL: 144.4
Total CHOL/HDL Ratio: 3
Triglycerides: 140 mg/dL (ref 0.0–149.0)
VLDL: 28 mg/dL (ref 0.0–40.0)

## 2022-05-23 LAB — HEMOGLOBIN A1C: Hgb A1c MFr Bld: 5.9 % (ref 4.6–6.5)

## 2022-05-23 LAB — TSH: TSH: 2.98 u[IU]/mL (ref 0.35–5.50)

## 2022-05-23 MED ORDER — ATORVASTATIN CALCIUM 20 MG PO TABS: 20.0000 mg | ORAL_TABLET | Freq: Every day | ORAL | 3 refills | Status: DC

## 2022-05-23 MED ORDER — TETANUS-DIPHTH-ACELL PERTUSSIS 5-2.5-18.5 LF-MCG/0.5 IM SUSP: 0.5000 mL | Freq: Once | INTRAMUSCULAR | 0 refills | Status: AC

## 2022-05-23 MED ORDER — METOPROLOL SUCCINATE ER 50 MG PO TB24: 50.0000 mg | ORAL_TABLET | Freq: Every day | ORAL | 3 refills | Status: DC

## 2022-05-23 MED ORDER — LEVOTHYROXINE SODIUM 88 MCG PO TABS: 88.0000 ug | ORAL_TABLET | Freq: Every day | ORAL | 3 refills | Status: DC

## 2022-05-23 MED ORDER — SHINGRIX 50 MCG/0.5ML IM SUSR: 0.5000 mL | Freq: Once | INTRAMUSCULAR | 1 refills | Status: AC

## 2022-05-23 MED ORDER — TELMISARTAN 80 MG PO TABS: 80.0000 mg | ORAL_TABLET | Freq: Every day | ORAL | 3 refills | Status: DC

## 2022-05-23 MED ORDER — ZOLPIDEM TARTRATE 5 MG PO TABS: 2.5000 mg | ORAL_TABLET | Freq: Every evening | ORAL | 1 refills | Status: DC | PRN

## 2022-05-23 MED ORDER — SHINGRIX 50 MCG/0.5ML IM SUSR: 0.5000 mL | Freq: Once | INTRAMUSCULAR | 1 refills | Status: DC

## 2022-05-23 NOTE — Patient Instructions (Addendum)
[facility] [employer] [employer] you (cranberry + d mannose)  06/18/2022 Ancillary Orders Lab Solum, Felipa Evener, MD   [place]   [facility] [place], [place] [address]   [phone] (Work)   [phone] (Fax)      06/18/2022 Ancillary Procedure Rheumatology Solum, Felipa Evener, MD   [place]   [facility] [place],  [address]   [phone] (Work)   [phone] (Fax)      Tdap (Tetanus, Diphtheria, Pertussis) Vaccine: What You Need to Know 1. Why get vaccinated? Tdap vaccine can prevent tetanus, diphtheria, and pertussis. Diphtheria and pertussis spread from person to person. Tetanus enters the body through cuts or wounds. TETANUS (T) causes painful stiffening of the muscles. Tetanus can lead to serious health problems, including being unable to open the mouth, having trouble swallowing and breathing, or death. DIPHTHERIA (D) can lead to difficulty breathing, heart failure, paralysis, or death. PERTUSSIS (aP), also known as "whooping cough," can cause uncontrollable, violent coughing that makes it hard to breathe, eat, or drink. Pertussis can be extremely serious especially in babies and young children, causing pneumonia, convulsions, brain damage, or death. In teens and adults, it can cause weight loss, loss of bladder control, passing out, and rib fractures from severe coughing. 2. Tdap vaccine Tdap is only for children 7 years and older, adolescents, and adults.  Adolescents should receive a single dose of Tdap, preferably at age 36 or 73 years. Pregnant people should get a dose of Tdap during every pregnancy, preferably during the early part of the third trimester, to help protect the newborn from pertussis. Infants are most at risk for severe, life-threatening complications from pertussis. Adults who have never received Tdap should get a dose of Tdap. Also, adults should receive a booster dose of either Tdap or Td (a different vaccine that protects  against tetanus and diphtheria but not pertussis) every 10 years, or after 5 years in the case of a severe or dirty wound or burn. Tdap may be given at the same time as other vaccines. 3. Talk with your health care provider Tell your vaccine provider if the person getting the vaccine: Has had an allergic reaction after a previous dose of any vaccine that protects against tetanus, diphtheria, or pertussis, or has any severe, life-threatening allergies Has had a coma, decreased level of consciousness, or prolonged seizures within 7 days after a previous dose of any pertussis vaccine (DTP, DTaP, or Tdap) Has seizures or another nervous system problem Has ever had Guillain-Barr Syndrome (also called "GBS") Has had severe pain or swelling after a previous dose of any vaccine that protects against tetanus or diphtheria In some cases, your health care provider may decide to postpone Tdap vaccination until a future visit. People with minor illnesses, such as a cold, may be vaccinated. People who are moderately or severely ill should usually wait until they recover before getting Tdap vaccine.  Your health care provider can give you more information. 4. Risks of a vaccine reaction Pain, redness, or swelling where the shot was given, mild fever, headache, feeling tired, and nausea, vomiting, diarrhea, or stomachache sometimes happen after Tdap vaccination. People sometimes faint after medical procedures, including vaccination. Tell your provider if you feel dizzy or have vision changes or ringing in the ears.  As with any medicine, there is a very remote chance of a vaccine causing a severe allergic reaction, other serious injury, or death. 5. What if there is a serious  problem? An allergic reaction could occur after the vaccinated person leaves the clinic. If you see signs of a severe allergic reaction (hives, swelling of the face and throat, difficulty breathing, a fast heartbeat, dizziness, or weakness),  call 9-1-1 and get the person to the nearest hospital. For other signs that concern you, call your health care provider.  Adverse reactions should be reported to the Vaccine Adverse Event Reporting System (VAERS). Your health care provider will usually file this report, or you can do it yourself. Visit the VAERS website at www.vaers.SamedayNews.es or call (216)570-5817. VAERS is only for reporting reactions, and VAERS staff members do not give medical advice. 6. The National Vaccine Injury Compensation Program The [employer] Vaccine Injury Compensation Program (VICP) is a federal program that was created to compensate people who may have been injured by certain vaccines. Claims regarding alleged injury or death due to vaccination have a time limit for filing, which may be as short as two years. Visit the VICP website at GoldCloset.com.ee or call 432-537-8502 to learn about the program and about filing a claim. 7. How can I learn more? Ask your health care provider. Call your local or state health department. Visit the website of the Food and Drug Administration (FDA) for vaccine package inserts and additional information at TraderRating.uy. Contact the Centers for Disease Control and Prevention (CDC): Call (458)449-5005 (1-800-CDC-INFO) or Visit CDC's website at http://hunter.com/. Source: CDC Vaccine Information Statement Tdap (Tetanus, Diphtheria, Pertussis) Vaccine (06/30/2020) This same material is available at http://www.wolf.info/ for no charge. This information is not intended to replace advice given to you by your health care provider. Make sure you discuss any questions you have with your health care provider. Document Revised: 10/10/2021 Document Reviewed: 08/13/2021 Elsevier Patient Education  Maria Spencer.  Zoster Vaccine, Recombinant injection What is this medication? ZOSTER VACCINE (ZOS ter vak SEEN) is a vaccine used to reduce the risk of  getting shingles. This vaccine is not used to treat shingles or nerve pain from shingles. This medicine may be used for other purposes; ask your health care provider or pharmacist if you have questions. COMMON BRAND NAME(S): [facility] What should I tell my care team before I take this medication? They need to know if you have any of these conditions: cancer immune system problems an unusual or allergic reaction to Zoster vaccine, other medications, foods, dyes, or preservatives pregnant or trying to get pregnant breast-feeding How should I use this medication? This vaccine is injected into a muscle. It is given by a health care provider. A copy of Vaccine Information Statements will be given before each vaccination. Be sure to read this information carefully each time. This sheet may change often. Talk to your health care provider about the use of this vaccine in children. This vaccine is not approved for use in children. Overdosage: If you think you have taken too much of this medicine contact a poison control center or emergency room at once. NOTE: This medicine is only for you. Do not share this medicine with others. What if I miss a dose? Keep appointments for follow-up (booster) doses. It is important not to miss your dose. Call your health care provider if you are unable to keep an appointment. What may interact with this medication? medicines that suppress your immune system medicines to treat cancer steroid medicines like prednisone or cortisone This list may not describe all possible interactions. Give your health care provider a list of all the medicines, herbs, non-prescription drugs, or dietary  supplements you use. Also tell them if you smoke, drink alcohol, or use illegal drugs. Some items may interact with your medicine. What should I watch for while using this medication? Visit your health care provider regularly. This vaccine, like all vaccines, may not fully protect  everyone. What side effects may I notice from receiving this medication? Side effects that you should report to your doctor or health care professional as soon as possible: allergic reactions (skin rash, itching or hives; swelling of the face, lips, or tongue) trouble breathing Side effects that usually do not require medical attention (report these to your doctor or health care professional if they continue or are bothersome): chills headache fever nausea pain, redness, or irritation at site where injected tiredness vomiting This list may not describe all possible side effects. Call your doctor for medical advice about side effects. You may report side effects to FDA at 1-800-FDA-1088. Where should I keep my medication? This vaccine is only given by a health care provider. It will not be stored at home. NOTE: This sheet is a summary. It may not cover all possible information. If you have questions about this medicine, talk to your doctor, pharmacist, or health care provider.  2023 Elsevier/Gold Standard (2021-10-12 00:00:00)

## 2022-05-23 NOTE — Progress Notes (Signed)
Chief Complaint  Patient presents with   Follow-up    3 mon, had L TKR in April doing good since surgery but still having pain in hip. Had MRI & CT on Monday. Sch to see spine specialist on 7/11   F/u  1. 03/18/22 left TKR doing well still not able to walk due to hip pain and low back pain s/p low back surgery and mid back surgery pending appt Dr. Lynann Bologna 06/04/22 2. Hypo on levo 88 mcg  3. Anemia w/u with labs   Review of Systems  Constitutional:  Negative for weight loss.  HENT:  Negative for hearing loss.   Eyes:  Negative for blurred vision.  Respiratory:  Negative for shortness of breath.   Cardiovascular:  Negative for chest pain.  Gastrointestinal:  Negative for abdominal pain and blood in stool.  Genitourinary:  Negative for dysuria.  Musculoskeletal:  Positive for back pain and joint pain. Negative for falls.  Skin:  Negative for rash.  Neurological:  Negative for headaches.  Psychiatric/Behavioral:  Negative for depression.    Past Medical History:  Diagnosis Date   Arthritis    neck, back, left knee;    Breast cancer ([place]) 2009   right lumpectomy s/p radiation x 1 week 2x per day only    Chicken pox    Chronic UTI    established with urology    COVID-19    ? 12/2020 sob  took antiviral, 08/15/21   Diverticular disease    -osis and -itis    Esophagitis    egd 05/17/14 see report scanned into chart   History of kidney stones    Hormone disorder    Hyperlipidemia    Hypertension    controlled well with medication;    Hypothyroidism    Osteoporosis    Personal history of radiation therapy 2009   F/U right breast cancer   Thyroid disease    hypothyroidism    Past Surgical History:  Procedure Laterality Date   BONE GRAFT HIP ILIAC CREST  2018   + cage left hip 10/23/17    BREAST BIOPSY Left 2009   clip,benign   BREAST BIOPSY Right 2009   +   BREAST LUMPECTOMY Right 2009   2009 lumpectomy    CARDIAC CATHETERIZATION  2001   No stents;  results were WNL Dr.  requested it for HTN   CATARACT EXTRACTION, BILATERAL Bilateral 10/2019   COLONOSCOPY WITH PROPOFOL N/A 05/15/2020   Procedure: COLONOSCOPY WITH PROPOFOL;  Surgeon: Lin Landsman, MD;  Location: [facility];  Service: Gastroenterology;  Laterality: N/A;   INSERTION OF MESH N/A 05/29/2018   Procedure: INSERTION OF MESH;  Surgeon: Johnathan Hausen, MD;  Location: [facility];  Service: General;  Laterality: N/A;   QUADRICEPS TENDON REPAIR Right 12/05/2020   Procedure: OPEN REPAIR OF RIGHT GLUTEUS MINIMUS TENDON;  Surgeon: Corky Mull, MD;  Location: [facility] ORS;  Service: Orthopedics;  Laterality: Right;   right gluteus medius/minimus repair Dr. Roland Rack 11/2020      Dr. Roland Rack   SHOULDER SURGERY Left 2008, 2009   x2 rotator cuff surgeries left shoulders   SPINAL FUSION  07/24/2020   SPINE SURGERY  10/23/2017   spinal 09/2017 h/o scoliosis [facility] L4-S1 OLIF T10 to ililum fusion   TONSILLECTOMY     age 76 y.o.    TOTAL KNEE ARTHROPLASTY Left 03/18/2022   Procedure: TOTAL KNEE ARTHROPLASTY;  Surgeon: Willaim Sheng, MD;  Location: [facility];  Service: Orthopedics;  Laterality: Left;  TOTAL SHOULDER REPLACEMENT Right 2012   TUBAL LIGATION     age 19   VENTRAL HERNIA REPAIR N/A 05/29/2018   Procedure: LAPAROSCOPIC VENTRAL / LATERAL HERNIA REPAIR;  Surgeon: Johnathan Hausen, MD;  Location: [facility];  Service: General;  Laterality: N/A;   Family History  Problem Relation Age of Onset   Arthritis Mother    Heart disease Mother 69       stent    Hyperlipidemia Mother    Hypertension Mother    Coronary artery disease Mother 45   Arthritis Father    Diabetes Father    Cancer Father        colon   AAA (abdominal aortic aneurysm) Father    Arthritis Sister    Hyperlipidemia Sister    Hypertension Sister    Cancer Brother        lung, smoker   Mental retardation Brother    Drug abuse Daughter        overdose in 2018    Arthritis Sister    Hyperlipidemia Sister    Bladder Cancer Neg Hx     Kidney cancer Neg Hx    Breast cancer Neg Hx    Social History   Socioeconomic History   Marital status: Widowed    Spouse name: Not on file   Number of children: 2   Years of education: Not on file   Highest education level: Not on file  Occupational History   Not on file  Tobacco Use   Smoking status: Never   Smokeless tobacco: Never  Vaping Use   Vaping Use: Never used  Substance and Sexual Activity   Alcohol use: Not Currently    Comment: rare   Drug use: No   Sexual activity: Yes    Birth control/protection: Post-menopausal  Other Topics Concern   Not on file  Social History Narrative   College grad   widowed husband died 08-03-19    Wears seatbelt and safe in relationship    3 kids : 1 daughter passed      [place] Strain: Low Risk  (10/09/2021)   Overall Financial Resource Strain (CARDIA)    Difficulty of Paying Living Expenses: Not hard at all  Food Insecurity: No Food Insecurity (10/09/2021)   Hunger Vital Sign    Worried About Running Out of Food in the Last Year: Never true    [place] in the Last Year: Never true  Transportation Needs: No Transportation Needs (10/09/2021)   PRAPARE - Hydrologist (Medical): No    Lack of Transportation (Non-Medical): No  Physical Activity: Insufficiently Active (10/09/2021)   Exercise Vital Sign    Days of Exercise per Week: 2 days    Minutes of Exercise per Session: 60 min  Stress: Stress Concern Present (10/09/2021)   [place]    Feeling of Stress : To some extent  Social Connections: Unknown (10/09/2021)   Social Connection and Isolation Panel [NHANES]    Frequency of Communication with Friends and Family: More than three times a week    Frequency of Social Gatherings with Friends and Family: Not on file    Attends Religious Services: Not on  file    Active Member of Clubs or Organizations: Yes    Attends Archivist Meetings: More than 4 times per year    Marital  Status: Widowed  Intimate Partner Violence: Not At Risk (10/09/2021)   Humiliation, Afraid, Rape, and Kick questionnaire    Fear of Current or Ex-Partner: No    Emotionally Abused: No    Physically Abused: No    Sexually Abused: No   Current Meds  Medication Sig   Calcium Carb-Cholecalciferol ([facility] CALCIUM 600 & VITAMIN D3 PO) Take 1 tablet by mouth daily.   cholecalciferol (VITAMIN D) 25 MCG (1000 UNIT) tablet Take 1,000 Units by mouth daily.   D-MANNOSE PO Take 1,300 mg by mouth daily.   Multiple Vitamin (MULTI-VITAMINS) TABS Take 1 tablet by mouth daily.   Probiotic Product (PROBIOTIC PO) Take 1 tablet by mouth daily.   Tdap (BOOSTRIX) 5-2.5-18.5 LF-MCG/0.5 injection Inject 0.5 mLs into the muscle once for 1 dose.   vitamin B-12 (CYANOCOBALAMIN) 1000 MCG tablet Take 1,000 mcg by mouth daily.   Zoster Vaccine Adjuvanted [facility]) injection Inject 0.5 mLs into the muscle once for 1 dose.   [DISCONTINUED] atorvastatin (LIPITOR) 20 MG tablet Take 1 tablet (20 mg total) by mouth daily.   [DISCONTINUED] levothyroxine (SYNTHROID) 88 MCG tablet Take 1 tablet (88 mcg total) by mouth daily before breakfast. 30 minutes   [DISCONTINUED] metoprolol succinate (TOPROL-XL) 50 MG 24 hr tablet Take 1 tablet (50 mg total) by mouth daily. Take with or immediately following a meal.   [DISCONTINUED] telmisartan (MICARDIS) 80 MG tablet Take 1 tablet (80 mg total) by mouth daily.   [DISCONTINUED] zolpidem (AMBIEN) 5 MG tablet Take 0.5 tablets (2.5 mg total) by mouth at bedtime as needed.   [DISCONTINUED] Zoster Vaccine Adjuvanted [facility]) injection Inject 0.5 mLs into the muscle once for 1 dose.   Allergies  Allergen Reactions   Sulfa Antibiotics Anaphylaxis, Hives, Itching, Shortness Of Breath and Swelling    Lips & throat swelled   Nitrofuran Derivatives Swelling    Lip  swelling; "eyes get red and puffy"   Amlodipine Besylate Other (See Comments)    gas, bloating  Heart racing    Recent Results (from the past 2160 hour(s))  CBC WITH DIFFERENTIAL     Status: None   Collection Time: 03/11/22  9:17 AM  Result Value Ref Range   WBC 5.3 4.0 - 10.5 K/uL   RBC 4.59 3.87 - 5.11 MIL/uL   Hemoglobin 13.5 12.0 - 15.0 g/dL   HCT 41.3 36.0 - 46.0 %   MCV 90.0 80.0 - 100.0 fL   MCH 29.4 26.0 - 34.0 pg   MCHC 32.7 30.0 - 36.0 g/dL   RDW 14.3 11.5 - 15.5 %   Platelets 204 150 - 400 K/uL   nRBC 0.0 0.0 - 0.2 %   Neutrophils Relative % 70 %   Neutro Abs 3.7 1.7 - 7.7 K/uL   Lymphocytes Relative 19 %   Lymphs Abs 1.0 0.7 - 4.0 K/uL   Monocytes Relative 6 %   Monocytes Absolute 0.3 0.1 - 1.0 K/uL   Eosinophils Relative 4 %   Eosinophils Absolute 0.2 0.0 - 0.5 K/uL   Basophils Relative 1 %   Basophils Absolute 0.1 0.0 - 0.1 K/uL   Immature Granulocytes 0 %   Abs Immature Granulocytes 0.02 0.00 - 0.07 K/uL    Comment: Performed at [facility], [place] [address], [place], [place] [address]  Comprehensive metabolic panel     Status: None   Collection Time: 03/11/22  9:17 AM  Result Value Ref Range   Sodium 139 135 - 145 mmol/L   Potassium 3.6  3.5 - 5.1 mmol/L   Chloride 105 98 - 111 mmol/L   CO2 28 22 - 32 mmol/L   Glucose, Bld 99 70 - 99 mg/dL    Comment: Glucose reference range applies only to samples taken after fasting for at least 8 hours.   BUN 21 8 - 23 mg/dL   Creatinine, Ser 0.54 0.44 - 1.00 mg/dL   Calcium 9.1 8.9 - 10.3 mg/dL   Total Protein 7.1 6.5 - 8.1 g/dL   Albumin 3.9 3.5 - 5.0 g/dL   AST 19 15 - 41 U/L   ALT 15 0 - 44 U/L   Alkaline Phosphatase 72 38 - 126 U/L   Total Bilirubin 0.6 0.3 - 1.2 mg/dL   GFR, Estimated >60 >60 mL/min    Comment: (NOTE) Calculated using the CKD-EPI Creatinine Equation (2021)    Anion gap 6 5 - 15    Comment: Performed at [facility], [place] [address] [place], [place]  [address]  Type and screen Order type and screen if day of surgery is less than 15 days from draw of preadmission visit or order morning of surgery if day of surgery is greater than 6 days from preadmission visit.     Status: None   Collection Time: 03/11/22  9:17 AM  Result Value Ref Range   ABO/RH(D) O POS    Antibody Screen NEG    Sample Expiration 03/21/2022,2359    Extend sample reason      NO TRANSFUSIONS OR PREGNANCY IN THE PAST 3 MONTHS Performed at [facility], [place] [address] [place], [place] [address]   Urinalysis, Routine w reflex microscopic     Status: Abnormal   Collection Time: 03/11/22  9:17 AM  Result Value Ref Range   Color, Urine STRAW (A) YELLOW   APPearance CLEAR CLEAR   Specific Gravity, Urine 1.006 1.005 - 1.030   pH 5.0 5.0 - 8.0   Glucose, UA NEGATIVE NEGATIVE mg/dL   Hgb urine dipstick NEGATIVE NEGATIVE   Bilirubin Urine NEGATIVE NEGATIVE   Ketones, ur NEGATIVE NEGATIVE mg/dL   Protein, ur NEGATIVE NEGATIVE mg/dL   Nitrite NEGATIVE NEGATIVE   Leukocytes,Ua NEGATIVE NEGATIVE    Comment: Performed at [place] [address], [place], [place] [address]  Urine Culture     Status: Abnormal   Collection Time: 03/11/22  9:17 AM   Specimen: Urine, Clean Catch  Result Value Ref Range   Specimen Description      URINE, CLEAN CATCH Performed at [facility], [place] [address], [place], [place] [address]    Special Requests      NONE Performed at [facility], [place] [address], [place], [place] [address]    Culture MULTIPLE SPECIES PRESENT, SUGGEST RECOLLECTION (A)    Report Status 03/12/2022 FINAL   Surgical pcr screen     Status: None   Collection Time: 03/11/22  9:33 AM   Specimen: Nasal Mucosa; Nasal Swab  Result Value Ref Range   MRSA, PCR NEGATIVE NEGATIVE   Staphylococcus aureus NEGATIVE NEGATIVE    Comment: (NOTE) The Xpert SA Assay (FDA approved for NASAL specimens in  patients 20 years of age and older), is one component of a comprehensive surveillance program. It is not intended to diagnose infection nor to guide or monitor treatment. Performed at [facility], [place] [address] [place], [place] [address]   ABO/Rh     Status: None   Collection Time:  03/18/22  8:30 AM  Result Value Ref Range   ABO/RH(D)      O POS Performed at [facility], [place] [address], [place], [place] [address]   CBC     Status: Abnormal   Collection Time: 03/19/22  3:16 AM  Result Value Ref Range   WBC 8.2 4.0 - 10.5 K/uL   RBC 3.92 3.87 - 5.11 MIL/uL   Hemoglobin 11.2 (L) 12.0 - 15.0 g/dL   HCT 35.7 (L) 36.0 - 46.0 %   MCV 91.1 80.0 - 100.0 fL   MCH 28.6 26.0 - 34.0 pg   MCHC 31.4 30.0 - 36.0 g/dL   RDW 14.6 11.5 - 15.5 %   Platelets 190 150 - 400 K/uL   nRBC 0.0 0.0 - 0.2 %    Comment: Performed at [facility], [place] [address] [place], [place] [address]  Basic metabolic panel     Status: Abnormal   Collection Time: 03/19/22  3:16 AM  Result Value Ref Range   Sodium 136 135 - 145 mmol/L   Potassium 3.9 3.5 - 5.1 mmol/L   Chloride 104 98 - 111 mmol/L   CO2 24 22 - 32 mmol/L   Glucose, Bld 157 (H) 70 - 99 mg/dL    Comment: Glucose reference range applies only to samples taken after fasting for at least 8 hours.   BUN 12 8 - 23 mg/dL   Creatinine, Ser 0.49 0.44 - 1.00 mg/dL   Calcium 8.4 (L) 8.9 - 10.3 mg/dL   GFR, Estimated >60 >60 mL/min    Comment: (NOTE) Calculated using the CKD-EPI Creatinine Equation (2021)    Anion gap 8 5 - 15    Comment: Performed at [facility], [place] [address] [place], [place] [address]   Objective  Body mass index is 26.82 kg/m. Wt Readings from Last 3 Encounters:  05/23/22 132 lb 12.8 oz (60.2 kg)  05/21/22 133 lb (60.3 kg)  03/18/22 134 lb 0.6 oz (60.8 kg)   Temp Readings from Last 3 Encounters:  05/23/22 98 F (36.7 C) (Oral)  05/21/22 98.1 F  (36.7 C) (Oral)  03/19/22 98.1 F (36.7 C) (Oral)   BP Readings from Last 3 Encounters:  05/23/22 130/80  05/21/22 (!) 181/102  03/19/22 (!) 145/77   Pulse Readings from Last 3 Encounters:  05/23/22 71  05/21/22 79  03/19/22 85    Physical Exam Vitals and nursing note reviewed.  Constitutional:      Appearance: Normal appearance. She is well-developed and well-groomed.  HENT:     Head: Normocephalic and atraumatic.  Eyes:     Conjunctiva/sclera: Conjunctivae normal.     Pupils: Pupils are equal, round, and reactive to light.  Cardiovascular:     Rate and Rhythm: Normal rate and regular rhythm.     Heart sounds: Normal heart sounds. No murmur heard. Pulmonary:     Effort: Pulmonary effort is normal.     Breath sounds: Normal breath sounds.  Abdominal:     General: Abdomen is flat. Bowel sounds are normal.     Tenderness: There is no abdominal tenderness.  Musculoskeletal:        General: No tenderness.  Skin:    General: Skin is warm and dry.  Neurological:     General: No focal deficit present.     Mental Status: She is alert and oriented to person, place, and time. Mental status is at baseline.     Cranial Nerves: Cranial  nerves 2-12 are intact.     Motor: Motor function is intact.     Coordination: Coordination is intact.     Gait: Gait is intact.     Comments: Walking with cane  Psychiatric:        Attention and Perception: Attention and perception normal.        Mood and Affect: Mood and affect normal.        Speech: Speech normal.        Behavior: Behavior normal. Behavior is cooperative.        Thought Content: Thought content normal.        Cognition and Memory: Cognition and memory normal.        Judgment: Judgment normal.     Assessment  Plan  Anemia, unspecified type - Plan: CBC with Differential/Platelet, IBC + Ferritin  Prediabetes - Plan: Hemoglobin A1c  Hyperlipidemia, unspecified hyperlipidemia type - Plan: Lipid panel, atorvastatin  (LIPITOR) 20 MG tablet  Hypothyroidism, unspecified type - Plan: TSH, levothyroxine (SYNTHROID) 88 MCG tablet   Hypertension, controlled - Plan: telmisartan (MICARDIS) 80 MG tablet, metoprolol succinate (TOPROL-XL) 50 MG 24 hr tablet  Insomnia, unspecified type - Plan: zolpidem (AMBIEN) 5 MG tablet   HM Had flu shot utd prevnar had 12/29/17, pna 23 10/10/18  Tdap had 12/09/11 rx given Rx shingrix, hep B vaccine prev.  zostervax had 2012  covid 19 vx pfizer 3/3 Never smoker      s/p right breast lumpectomy 2009 and radiation only  -mammogram 05/31/19 negative, 06/05/20 negative  mammo ordered 05/2021  negative 06/11/21 Sch 06/12/22   Pap out of age window no h/o abnormal pap  DEXA had 04/28/13 h/o osteopenia, +osteoporosis 12/27/19  06/02/20 T -3.0, -3.2, -3.7 Could not tolerate forteo had reclast influsion 11/23/20 or 11/24/2020 f/u in 1 year 2nd infusion [facility] endocrine sch 10/2021 2nd infusion reclast Dexa 06/18/22 [facility] endo with labs   Colonoscopy copy obtained reviewed 01/10/09 grade 1 IH and diverticulosis  -colonoscopy due at f/u will hold for now with other multiple appts  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 [name]   rec healthy diet and exercise  Provider: Dr. Olivia Mackie McLean-Scocuzza-Internal Medicine

## 2022-05-27 ENCOUNTER — Telehealth: Payer: Self-pay

## 2022-05-27 NOTE — Telephone Encounter (Signed)
Patient states she saw Dr. Olivia Mackie McLean-Scocuzza last week and forgot to tell her about the following two medications:  telmisartan (MICARDIS) 80 MG tablet - patient states the refills have been going to [facility], but the prescription was from the doctor who performed the patient's knee surgery.  Patient states [facility] needs to have a prescription for this medication from patient's PCP.  zolpidem (AMBIEN) 5 MG tablet - patient states she doesn't go through her regular pharmacy for this medication because her insurance will only pay for it once a year.  Patient states she would like for [place] to send the prescription to [facility] in [place] so she can use her Good RX card for the medication.  Also, patient states she has been taking the full 5 MG dose of the medication instead of .25 MG, since she has been having back and knee issues that are affecting her sleep.  Patient states she would like for [place] to refill this medication as 5 MG dose if possible.

## 2022-05-29 ENCOUNTER — Other Ambulatory Visit: Payer: Self-pay | Admitting: Internal Medicine

## 2022-05-29 DIAGNOSIS — I10 Essential (primary) hypertension: Secondary | ICD-10-CM

## 2022-05-29 DIAGNOSIS — G4700 Insomnia, unspecified: Secondary | ICD-10-CM

## 2022-05-29 MED ORDER — TELMISARTAN 80 MG PO TABS: 80.0000 mg | ORAL_TABLET | Freq: Every day | ORAL | 3 refills | Status: DC

## 2022-05-29 MED ORDER — ZOLPIDEM TARTRATE 5 MG PO TABS: 5.0000 mg | ORAL_TABLET | Freq: Every evening | ORAL | 1 refills | Status: DC | PRN

## 2022-05-29 NOTE — Telephone Encounter (Signed)
Meds sent

## 2022-05-30 NOTE — Telephone Encounter (Signed)
Maria Spencer from [employer] order called needing a PA for [employer] number-[phone] Reference number [id number]

## 2022-06-03 ENCOUNTER — Ambulatory Visit: Payer: Medicare Other | Admitting: Urology

## 2022-06-07 ENCOUNTER — Ambulatory Visit (INDEPENDENT_AMBULATORY_CARE_PROVIDER_SITE_OTHER): Payer: Medicare Other | Admitting: Physician Assistant

## 2022-06-07 ENCOUNTER — Encounter: Payer: Self-pay | Admitting: Physician Assistant

## 2022-06-07 VITALS — BP 177/92 | HR 74 | Ht 59.0 in | Wt 131.0 lb

## 2022-06-07 DIAGNOSIS — N390 Urinary tract infection, site not specified: Secondary | ICD-10-CM | POA: Diagnosis not present

## 2022-06-07 LAB — MICROSCOPIC EXAMINATION

## 2022-06-07 LAB — URINALYSIS, COMPLETE
Bilirubin, UA: NEGATIVE
Glucose, UA: NEGATIVE
Ketones, UA: NEGATIVE
Nitrite, UA: POSITIVE — AB
Protein,UA: NEGATIVE
Specific Gravity, UA: 1.015 (ref 1.005–1.030)
Urobilinogen, Ur: 0.2 mg/dL (ref 0.2–1.0)
pH, UA: 6.5 (ref 5.0–7.5)

## 2022-06-07 MED ORDER — CEFUROXIME AXETIL 250 MG PO TABS: 250.0000 mg | ORAL_TABLET | Freq: Two times a day (BID) | ORAL | 0 refills | Status: AC

## 2022-06-07 NOTE — Progress Notes (Signed)
06/07/2022 12:57 PM   Maria Spencer 10-21-1946 [id number]  CC: Chief Complaint  Patient presents with   Follow-up   Recurrent UTI   HPI: Maria Spencer is a 76 y.o. 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   Today she reports she has not had a urinary tract infection in approximately the past year.  She stopped Keflex about a year ago due to diarrhea.  She has been taking d-mannose and thinks this has been helping tremendously.  She reports a 2-day history of dysuria, frequency, and left flank/low back pain.  She denies fever, chills, nausea, or vomiting.  She has a history of kidney stones.  No stone seen on CT AP with contrast dated 02/12/2022.  In-office UA today positive for trace intact blood, nitrites, and 2+ leukocyte esterase; urine microscopy with 11-30 WBCs/HPF and many bacteria.   PMH: Past Medical History:  Diagnosis Date   Arthritis    neck, back, left knee;    Breast cancer ([place]) 2009   right lumpectomy s/p radiation x 1 week 2x per day only    Chicken pox    Chronic UTI    established with urology    COVID-19    ? 12/2020 sob  took antiviral, 08/15/21   Diverticular disease    -osis and -itis    Esophagitis    egd 05/17/14 see report scanned into chart   History of kidney stones    Hormone disorder    Hyperlipidemia    Hypertension    controlled well with medication;    Hypothyroidism    Osteoporosis    Personal history of radiation therapy 2009   F/U right breast cancer   Thyroid disease    hypothyroidism     Surgical History: Past Surgical History:  Procedure Laterality Date   BONE GRAFT HIP ILIAC CREST  2018   + cage left hip 10/23/17    BREAST BIOPSY Left 2009   clip,benign   BREAST BIOPSY Right 2009   +   BREAST LUMPECTOMY Right 2009   2009 lumpectomy    CARDIAC CATHETERIZATION  2001   No stents;  results were WNL  Dr. requested it for HTN   CATARACT EXTRACTION, BILATERAL Bilateral 10/2019   COLONOSCOPY WITH PROPOFOL N/A 05/15/2020   Procedure: COLONOSCOPY WITH PROPOFOL;  Surgeon: Lin Landsman, MD;  Location: [facility] ENDOSCOPY;  Service: Gastroenterology;  Laterality: N/A;   INSERTION OF MESH N/A 05/29/2018   Procedure: INSERTION OF MESH;  Surgeon: Johnathan Hausen, MD;  Location: [facility] ORS;  Service: General;  Laterality: N/A;   QUADRICEPS TENDON REPAIR Right 12/05/2020   Procedure: OPEN REPAIR OF RIGHT GLUTEUS MINIMUS TENDON;  Surgeon: Corky Mull, MD;  Location: [facility] ORS;  Service: Orthopedics;  Laterality: Right;   right gluteus medius/minimus repair Dr. Roland Rack 11/2020      Dr. Roland Rack   SHOULDER SURGERY Left 2008, 2009   x2 rotator cuff surgeries left shoulders   SPINAL FUSION  07/24/2020   SPINE SURGERY  10/23/2017   spinal 09/2017 h/o scoliosis [facility] L4-S1 OLIF T10 to ililum fusion   TONSILLECTOMY     age 25 y.o.    TOTAL KNEE ARTHROPLASTY Left 03/18/2022   Procedure: TOTAL KNEE ARTHROPLASTY;  Surgeon: Willaim Sheng, MD;  Location: [facility] ORS;  Service: Orthopedics;  Laterality: Left;   TOTAL SHOULDER REPLACEMENT Right 2012   TUBAL LIGATION  age 67   [place] N/A 05/29/2018   Procedure: LAPAROSCOPIC VENTRAL / [place];  Surgeon: Johnathan Hausen, MD;  Location: [facility] ORS;  Service: General;  Laterality: N/A;    Home Medications:  Allergies as of 06/07/2022       Reactions   Sulfa Antibiotics Anaphylaxis, Hives, Itching, Shortness Of Breath, Swelling   Lips & throat swelled   Nitrofuran Derivatives Swelling   Lip swelling; "eyes get red and puffy"   Amlodipine Besylate Other (See Comments)   gas, bloating  Heart racing         Medication List        Accurate as of June 07, 2022 12:57 PM. If you have any questions, ask your nurse or doctor.          atorvastatin 20 MG tablet Commonly known as: LIPITOR Take 1 tablet (20 mg total) by mouth daily.    cefUROXime 250 MG tablet Commonly known as: CEFTIN Take 1 tablet (250 mg total) by mouth 2 (two) times daily with a meal for 7 days. Started by: Debroah Loop, PA-C   cholecalciferol 25 MCG (1000 UNIT) tablet Commonly known as: VITAMIN D Take 1,000 Units by mouth daily.   CVS CALCIUM 600 & VITAMIN D3 PO Take 1 tablet by mouth daily.   D-MANNOSE PO Take 1,300 mg by mouth daily.   levothyroxine 88 MCG tablet Commonly known as: SYNTHROID Take 1 tablet (88 mcg total) by mouth daily before breakfast. 30 minutes   metoprolol succinate 50 MG 24 hr tablet Commonly known as: TOPROL-XL Take 1 tablet (50 mg total) by mouth daily. Take with or immediately following a meal.   Multi-Vitamins Tabs Take 1 tablet by mouth daily.   PROBIOTIC PO Take 1 tablet by mouth daily.   telmisartan 80 MG tablet Commonly known as: MICARDIS Take 1 tablet (80 mg total) by mouth daily.   vitamin B-12 1000 MCG tablet Commonly known as: CYANOCOBALAMIN Take 1,000 mcg by mouth daily.   zolpidem 5 MG tablet Commonly known as: AMBIEN Take 1 tablet (5 mg total) by mouth at bedtime as needed.        Allergies:  Allergies  Allergen Reactions   Sulfa Antibiotics Anaphylaxis, Hives, Itching, Shortness Of Breath and Swelling    Lips & throat swelled   Nitrofuran Derivatives Swelling    Lip swelling; "eyes get red and puffy"   Amlodipine Besylate Other (See Comments)    gas, bloating  Heart racing     Family History: Family History  Problem Relation Age of Onset   Arthritis Mother    Heart disease Mother 43       stent    Hyperlipidemia Mother    Hypertension Mother    Coronary artery disease Mother 22   Arthritis Father    Diabetes Father    Cancer Father        colon   AAA (abdominal aortic aneurysm) Father    Arthritis Sister    Hyperlipidemia Sister    Hypertension Sister    Cancer Brother        lung, smoker   Mental retardation Brother    Drug abuse Daughter         overdose in 2018    Arthritis Sister    Hyperlipidemia Sister    Bladder Cancer Neg Hx    Kidney cancer Neg Hx    Breast cancer Neg Hx     Social History:   reports that she has never  smoked. She has never been exposed to tobacco smoke. She has never used smokeless tobacco. She reports that she does not currently use alcohol. She reports that she does not use drugs.  Physical Exam: BP (!) 177/92   Pulse 74   Ht '4\' 11"'$  (1.499 m)   Wt 131 lb (59.4 kg)   BMI 26.46 kg/m   Constitutional:  Alert and oriented, no acute distress, nontoxic appearing HEENT: [place], AT Cardiovascular: No clubbing, cyanosis, or edema Respiratory: Normal respiratory effort, no increased work of breathing GU: No CVA tenderness Skin: No rashes, bruises or suspicious lesions Neurologic: Grossly intact, no focal deficits, moving all 4 extremities Psychiatric: Normal mood and affect  Laboratory Data: Results for orders placed or performed in visit on 06/07/22  Microscopic Examination   Urine  Result Value Ref Range   WBC, UA 11-30 (A) 0 - 5 /hpf   RBC, Urine 0-2 0 - 2 /hpf   Epithelial Cells (non renal) 0-10 0 - 10 /hpf   Bacteria, UA Many (A) None seen/Few  Urinalysis, Complete  Result Value Ref Range   Specific Gravity, UA 1.015 1.005 - 1.030   pH, UA 6.5 5.0 - 7.5   Color, UA Yellow Yellow   Appearance Ur Cloudy (A) Clear   Leukocytes,UA 2+ (A) Negative   Protein,UA Negative Negative/Trace   Glucose, UA Negative Negative   Ketones, UA Negative Negative   RBC, UA Trace (A) Negative   Bilirubin, UA Negative Negative   Urobilinogen, Ur 0.2 0.2 - 1.0 mg/dL   Nitrite, UA Positive (A) Negative   Microscopic Examination See below:    Assessment & Plan:   1. Recurrent UTI UA today is notable for nitrites, pyuria, and bacteriuria.  She is VSS.  With no CVA tenderness, visible stones on recent contrast CT, or hematuria, my clinical suspicion for stone episode is low.  We will treat with empiric cefuroxime  and send for culture for further evaluation.  If she spikes a fever or does not improve with culture appropriate antibiotics, we will pursue additional imaging.  She is in agreement with this plan. - Urinalysis, Complete - CULTURE, URINE COMPREHENSIVE - cefUROXime (CEFTIN) 250 MG tablet; Take 1 tablet (250 mg total) by mouth 2 (two) times daily with a meal for 7 days.  Dispense: 14 tablet; Refill: 0  Return if symptoms worsen or fail to improve.  Debroah Loop, PA-C  [facility] [address], [place], [place] [address] [phone]

## 2022-06-10 DIAGNOSIS — M533 Sacrococcygeal disorders, not elsewhere classified: Secondary | ICD-10-CM | POA: Diagnosis not present

## 2022-06-10 DIAGNOSIS — M4326 Fusion of spine, lumbar region: Secondary | ICD-10-CM | POA: Diagnosis not present

## 2022-06-11 ENCOUNTER — Other Ambulatory Visit: Payer: Self-pay | Admitting: Orthopedic Surgery

## 2022-06-11 DIAGNOSIS — G8929 Other chronic pain: Secondary | ICD-10-CM

## 2022-06-12 ENCOUNTER — Ambulatory Visit
Admission: RE | Admit: 2022-06-12 | Discharge: 2022-06-12 | Disposition: A | Discharge status: Home / Self Care | Payer: Medicare Other | Source: Ambulatory Visit | Attending: Internal Medicine | Admitting: Internal Medicine

## 2022-06-12 DIAGNOSIS — Z1231 Encounter for screening mammogram for malignant neoplasm of breast: Secondary | ICD-10-CM | POA: Diagnosis not present

## 2022-06-13 DIAGNOSIS — M1712 Unilateral primary osteoarthritis, left knee: Secondary | ICD-10-CM | POA: Diagnosis not present

## 2022-06-13 LAB — CULTURE, URINE COMPREHENSIVE

## 2022-06-18 DIAGNOSIS — M810 Age-related osteoporosis without current pathological fracture: Secondary | ICD-10-CM | POA: Diagnosis not present

## 2022-06-19 LAB — HM DEXA SCAN

## 2022-06-24 ENCOUNTER — Ambulatory Visit: Payer: Medicare Other

## 2022-06-24 ENCOUNTER — Telehealth: Payer: Self-pay | Admitting: Internal Medicine

## 2022-06-24 NOTE — Telephone Encounter (Signed)
Pt called stating she went to the pharmacy to get a Tdap shot/boost strips and they stated that her insurance does not cover the shot unless she gets it done at her doctor office. Pt would like to be called

## 2022-06-26 ENCOUNTER — Ambulatory Visit (INDEPENDENT_AMBULATORY_CARE_PROVIDER_SITE_OTHER): Payer: Medicare Other | Admitting: Internal Medicine

## 2022-06-26 ENCOUNTER — Encounter: Payer: Self-pay | Admitting: Internal Medicine

## 2022-06-26 VITALS — BP 150/80 | HR 83 | Ht 59.0 in | Wt 135.0 lb

## 2022-06-26 DIAGNOSIS — R0602 Shortness of breath: Secondary | ICD-10-CM

## 2022-06-26 DIAGNOSIS — R002 Palpitations: Secondary | ICD-10-CM | POA: Diagnosis not present

## 2022-06-26 DIAGNOSIS — R0789 Other chest pain: Secondary | ICD-10-CM

## 2022-06-26 DIAGNOSIS — R29898 Other symptoms and signs involving the musculoskeletal system: Secondary | ICD-10-CM

## 2022-06-26 DIAGNOSIS — I10 Essential (primary) hypertension: Secondary | ICD-10-CM | POA: Diagnosis not present

## 2022-06-26 MED ORDER — METOPROLOL SUCCINATE ER 50 MG PO TB24: 50.0000 mg | ORAL_TABLET | Freq: Two times a day (BID) | ORAL | 0 refills | Status: DC

## 2022-06-26 NOTE — Patient Instructions (Signed)
Medication Instructions:   Your physician has recommended you make the following change in your medication:   1) HOLD atorvastatin for 3-4 weeks and let us know if your leg weakness improves  2) INCREASE metoprolol succinate 50 mg TWICE daily   *If you need a refill on your cardiac medications before your next appointment, please call your pharmacy*   Lab Work:  None ordered  Testing/Procedures:  Your physician has requested that you have a lower extremity arterial exercise duplex. During this test, exercise and ultrasound are used to evaluate arterial blood flow in the legs. Allow one hour for this exam. There are no restrictions or special instructions.  Your physician has requested that you have an ankle brachial index (ABI). During this test an ultrasound and blood pressure cuff are used to evaluate the arteries that supply the arms and legs with blood. Allow thirty minutes for this exam. There are no restrictions or special instructions.    Follow-Up: At [facility], you and your health needs are our priority.  As part of our continuing mission to provide you with exceptional heart care, we have created designated Provider Care Teams.  These Care Teams include your primary Cardiologist (physician) and Advanced Practice Providers (APPs -  Physician Assistants and Nurse Practitioners) who all work together to provide you with the care you need, when you need it.  We recommend signing up for the patient portal called "MyChart".  Sign up information is provided on this After Visit Summary.  MyChart is used to connect with patients for Virtual Visits (Telemedicine).  Patients are able to view lab/test results, encounter notes, upcoming appointments, etc.  Non-urgent messages can be sent to your provider as well.   To learn more about what you can do with MyChart, go to [email or web address].    Your next appointment:   3 month(s)  The format for your next appointment:   In  Person  Provider:   You may see [name], MD or one of the following Advanced Practice Providers on your designated Care Team:   Murray Hodgkins, NP Christell Faith, PA-C Cadence Kathlen Mody, [place]  Important Information About Sugar

## 2022-06-26 NOTE — Progress Notes (Signed)
Follow-up Outpatient Visit Date: 06/26/2022  Primary Care Provider: McLean-Scocuzza, Nino Glow, MD [place] [address]  Chief Complaint: Follow-up chest pain and hypertension  HPI:  Ms. Cavallaro is a 76 y.o. female with history of hypertension, hyperlipidemia, right breast cancer status post lumpectomy and radiation, hypothyroidism, and esophagitis, who presents for follow-up of chest pain and hypertension.  She was last seen in our office in 03/2021 by Laurann Montana, NP, for follow-up after hospitalization a month earlier for sepsis int he setting of diverticulitis.  She had previously complained of episodic left-sided chest pain, primarily near the left axilla.  At her last visit, she was feeling well other than stable exertional dyspnea.  Prior coronary CTA in 2020 showed normal coronary arteries (though significant motion artifact was present).  Echo in 02/2021 was normal other than mild LVH, grade 1 diastolic dysfunction, and mild MR.  No medication changes or additional testing were pursued at that time.  Today, Ms. Junco is most bothered by continued leg weakness that makes it difficult for her to walk.  This been present for at least 2 years.  She has been evaluated by multiple specialists and was told that spine hardware may be contributing to this.  She notes intermittent chest pressure that comes and goes and is associated with transient palpitations.  When it happens, she usually sits down and takes deep breaths.  The episodes resolve after 1 to 2 minutes.  They most commonly occur in the evenings.  Otherwise, her breathing has been okay.  She denies edema.  She does not check her blood pressure regularly at home.  She reports having increased atorvastatin from every other day to daily dosing within the last few months due to suboptimal lipid control.  She has never undergone a statin holiday to see if that would help with her leg  weakness.   --------------------------------------------------------------------------------------------------  Cardiovascular History & Procedures: Cardiovascular Problems: Chest pain Shortness of breath Palpitations   Risk Factors: Hypertension, hyperlipidemia, age greater than 68   Cath/PCI: None available.  Patient reports clean catheterization 10 to 15 years ago in [place], [place].   CV Surgery: None   EP Procedures and Devices: Event monitor (11/20/2019): Predominantly sinus rhythm with rare PAC's and PVC's.  Two brief episodes of asymptomatic PSVT noted lasting up to 9 beats.   Non-Invasive Evaluation(s): TTE (03/09/2021): Normal LV size with mild LVH.  LVEF 55-60% with grade 1 diastolic function.  No wall motion abnormality.  Normal RV size and function.  Mild mitral regurgitation.  Normal CVP. Cardiac CTA (10/13/2019): No significant CAD.  CAC score = 0.  Incidental note made of incompletely imaged subpleural nodule in the right lower lobe. TTE (10/10/2018): Mild focal basal hypertrophy of the septum.  Normal LV size with LVEF 65%.  Grade 1 diastolic dysfunction. Mild MR and TR.  Mild LAE. Patient reports abnormal stress test attributed to breast attenuation 10 to 15 years ago in [place], [place].  Recent CV Pertinent Labs: Lab Results  Component Value Date   CHOL 222 (H) 05/23/2022   CHOL 208 (H) 07/12/2020   HDL 77.20 05/23/2022   HDL 74 07/12/2020   LDLCALC 116 (H) 05/23/2022   LDLCALC 112 (H) 07/12/2020   LDLCALC 138 (H) 09/10/2019   TRIG 140.0 05/23/2022   CHOLHDL 3 05/23/2022   INR 1.0 03/15/2021   BNP 39.3 03/15/2021   K 3.9 03/19/2022   MG 2.0 05/08/2020   BUN 12 03/19/2022   CREATININE 0.49 03/19/2022  CREATININE 0.57 (L) 09/10/2019    Past medical and surgical history were reviewed and updated in EPIC.  Current Meds  Medication Sig   atorvastatin (LIPITOR) 20 MG tablet Take 1 tablet (20 mg total) by mouth daily.   Calcium Carb-Cholecalciferol  (CVS CALCIUM 600 & VITAMIN D3 PO) Take 1 tablet by mouth daily.   cholecalciferol (VITAMIN D) 25 MCG (1000 UNIT) tablet Take 1,000 Units by mouth daily.   D-MANNOSE PO Take 1,300 mg by mouth daily.   levothyroxine (SYNTHROID) 88 MCG tablet Take 1 tablet (88 mcg total) by mouth daily before breakfast. 30 minutes   metoprolol succinate (TOPROL-XL) 50 MG 24 hr tablet Take 1 tablet (50 mg total) by mouth daily. Take with or immediately following a meal.   Multiple Vitamin (MULTI-VITAMINS) TABS Take 1 tablet by mouth daily.   telmisartan (MICARDIS) 80 MG tablet Take 1 tablet (80 mg total) by mouth daily.   vitamin B-12 (CYANOCOBALAMIN) 1000 MCG tablet Take 1,000 mcg by mouth daily.   zolpidem (AMBIEN) 5 MG tablet Take 1 tablet (5 mg total) by mouth at bedtime as needed.    Allergies: Sulfa antibiotics, Nitrofuran derivatives, and Amlodipine besylate  Social History   Tobacco Use   Smoking status: Never    Passive exposure: Never   Smokeless tobacco: Never  Vaping Use   Vaping Use: Never used  Substance Use Topics   Alcohol use: Not Currently    Comment: rare   Drug use: No    Family History  Problem Relation Age of Onset   Arthritis Mother    Heart disease Mother 9       stent    Hyperlipidemia Mother    Hypertension Mother    Coronary artery disease Mother 32   Arthritis Father    Diabetes Father    Cancer Father        colon   AAA (abdominal aortic aneurysm) Father    Arthritis Sister    Hyperlipidemia Sister    Hypertension Sister    Cancer Brother        lung, smoker   Mental retardation Brother    Drug abuse Daughter        overdose in 2018    Arthritis Sister    Hyperlipidemia Sister    Bladder Cancer Neg Hx    Kidney cancer Neg Hx    Breast cancer Neg Hx     Review of Systems: A 12-system review of systems was performed and was negative except as noted in the  HPI.  --------------------------------------------------------------------------------------------------  Physical Exam: BP (!) 150/80 (BP Location: Left Arm, Patient Position: Sitting, Cuff Size: Normal)   Pulse 83   Ht '4\' 11"'$  (1.499 m)   Wt 135 lb (61.2 kg)   SpO2 95%   BMI 27.27 kg/m   General:  NAD. Neck: No JVD or HJR. Lungs: Clear to auscultation bilaterally without wheezes or crackles. Heart: Regular rate and rhythm without murmurs, rubs, or gallops. Abdomen: Soft, nontender, nondistended. Extremities: No lower extremity edema.  Pedal pulses are 2+ bilaterally.  EKG: Normal sinus rhythm with significant abnormality.  Heart rate has increased since 03/11/2022.  Otherwise, no significant abnormality.  Lab Results  Component Value Date   WBC 5.5 05/23/2022   HGB 13.4 05/23/2022   HCT 41.4 05/23/2022   MCV 87.1 05/23/2022   PLT 227.0 05/23/2022    Lab Results  Component Value Date   NA 136 03/19/2022   K 3.9 03/19/2022   CL 104 03/19/2022  CO2 24 03/19/2022   BUN 12 03/19/2022   CREATININE 0.49 03/19/2022   GLUCOSE 157 (H) 03/19/2022   ALT 15 03/11/2022    Lab Results  Component Value Date   CHOL 222 (H) 05/23/2022   HDL 77.20 05/23/2022   LDLCALC 116 (H) 05/23/2022   TRIG 140.0 05/23/2022   CHOLHDL 3 05/23/2022    --------------------------------------------------------------------------------------------------  ASSESSMENT AND PLAN: Atypical chest pain and shortness of breath: Symptoms have been longstanding and are not consistent with angina.  Prior work-up including coronary CTA in 2020 and echocardiogram in 2022 were reassuring.  Due to suboptimal blood pressure control and concern for palpitations, we have agreed to increase metoprolol succinate to 50 mg twice daily.  Palpitations: Prior event monitor showed rare ectopy.  We will increase metoprolol succinate to 50 mg twice daily.  Leg weakness: This has been a chronic problem and most likely  related to prior back instrumentation and injuries.  I have recommended a 3-4-week statin holiday to exclude statin myopathy as a potential cause.  We will also obtain ABIs to exclude significant PAD.  Hypertension: Blood pressure suboptimally controlled today.  We will increase metoprolol succinate to 50 mg twice daily and continue telmisartan 80 mg daily.  Ms. Rask would like to avoid adding an additional agent at this time.  Follow-up: Return to clinic in 3 months.  [name], MD 06/26/2022 9:37 AM

## 2022-06-27 DIAGNOSIS — M810 Age-related osteoporosis without current pathological fracture: Secondary | ICD-10-CM | POA: Diagnosis not present

## 2022-07-01 ENCOUNTER — Ambulatory Visit (INDEPENDENT_AMBULATORY_CARE_PROVIDER_SITE_OTHER): Payer: Medicare Other | Admitting: Urology

## 2022-07-01 ENCOUNTER — Encounter: Payer: Self-pay | Admitting: Urology

## 2022-07-01 VITALS — BP 166/83 | HR 68 | Ht 59.0 in | Wt 137.0 lb

## 2022-07-01 DIAGNOSIS — Z87440 Personal history of urinary (tract) infections: Secondary | ICD-10-CM | POA: Diagnosis not present

## 2022-07-01 DIAGNOSIS — N390 Urinary tract infection, site not specified: Secondary | ICD-10-CM | POA: Diagnosis not present

## 2022-07-01 DIAGNOSIS — N3020 Other chronic cystitis without hematuria: Secondary | ICD-10-CM | POA: Diagnosis not present

## 2022-07-01 LAB — URINALYSIS, COMPLETE
Bilirubin, UA: NEGATIVE
Glucose, UA: NEGATIVE
Ketones, UA: NEGATIVE
Nitrite, UA: NEGATIVE
Protein,UA: NEGATIVE
Specific Gravity, UA: 1.015 (ref 1.005–1.030)
Urobilinogen, Ur: 0.2 mg/dL (ref 0.2–1.0)
pH, UA: 5.5 (ref 5.0–7.5)

## 2022-07-01 LAB — MICROSCOPIC EXAMINATION: Bacteria, UA: NONE SEEN

## 2022-07-01 NOTE — Progress Notes (Signed)
07/01/2022 9:45 AM   Maria Spencer July 02, 1946 [id number]  Referring provider: McLean-Scocuzza, Nino Glow, MD [place],  [place] [address]  Chief Complaint  Patient presents with   Urinary Incontinence    1 year follow up    HPI: The patient was consulted to [place] for recurrent urinary tract infections.  I went through some of her medical records.  She has had positive cultures resistant to antibiotics.   She had been placed on trimethoprim for 90 days and had a sling 25 years ago.  She had a negative CAT scan in May 2016.    The patient describes pressure and bloating and small volume frequency that generally respond to antibiotics.  She can get an infection every several weeks.  She was on Macrodantin daily for 30 days.  10 days after stopping it she once again had another infection   At baseline she voids every 2 hours and is continent.  She gets up 3-4 times a night    The patient has little to no symptoms and possibly some mild fullness from her cystocele.     Patient describes 6 or 7 bladder infections in the last year.  She will get difficulty voiding and burning and heaviness that respond usually to antibiotics.  She has finished an antibiotic and she is not for certain if it is totally cleared.  She describes breakthrough infections on the daily Macrodantin so was asked to stop it due to breakthrough resistant organisms   She has a severe reaction to sulfa drug and I think is best not to try trimethoprim which may have worked in the past but she was not necessarily taking it daily.  And now looks like she might be allergic to Macrodantin when I reviewed her allergies.  She understands her relative treatment options and we talked about daily Keflex.  I also mentioned local estrogen cream probiotics and cranberry   Reassess in 8 weeks on daily Keflex.  Call if urine culture is positive.  She might be interested in taking estrogen cream but will speak to her breast  cancer provider before every doing so   Clinically was infection free on Keflex.  She developed thrush and treated herself with diet changes and mouthwash.  Culture in May when I saw her negative.  Came in with breakthrough symptoms and a positive culture and saw a nurse practitioner a few months ago.  Antibiotic switch to doxycycline.   Patient had stopped daily Keflex because of a lot of hospital issues with her back neck and hip.  Last Thursday she started having strong smelling urine and pressure.  She put herself back on daily Keflex with d-mannose AZO and increase water intake and symptoms are improving.  Still on the daily Keflex.   Today Frequency stable.  Came in with cystitis symptoms in February on daily Keflex her culture was negative She has had a lot of trouble with diverticulitis and has been on numerous antibiotics.  She is just coming off and will go back on her daily Keflex.  Infection free clinically.  Today In July 2023 patient seen by nurse practitioner and she had not had an infection for many months and had stopped Keflex due to diarrhea.  Culture was positive  Not infected today.  Frequency stable       PMH: Past Medical History:  Diagnosis Date   Arthritis    neck, back, left knee;    Breast cancer ([place]) 2009   right  lumpectomy s/p radiation x 1 week 2x per day only    Chicken pox    Chronic UTI    established with urology    COVID-19    ? 12/2020 sob  took antiviral, 08/15/21   Diverticular disease    -osis and -itis    Esophagitis    egd 05/17/14 see report scanned into chart   History of kidney stones    Hormone disorder    Hyperlipidemia    Hypertension    controlled well with medication;    Hypothyroidism    Osteoporosis    Personal history of radiation therapy 2009   F/U right breast cancer   Thyroid disease    hypothyroidism     Surgical History: Past Surgical History:  Procedure Laterality Date   BONE GRAFT HIP ILIAC CREST  2018   + cage  left hip 10/23/17    BREAST BIOPSY Left 2009   clip,benign   BREAST BIOPSY Right 2009   +   BREAST LUMPECTOMY Right 2009   2009 lumpectomy    CARDIAC CATHETERIZATION  2001   No stents;  results were WNL Dr. requested it for HTN   CATARACT EXTRACTION, BILATERAL Bilateral 10/2019   COLONOSCOPY WITH PROPOFOL N/A 05/15/2020   Procedure: COLONOSCOPY WITH PROPOFOL;  Surgeon: Lin Landsman, MD;  Location: [facility] ENDOSCOPY;  Service: Gastroenterology;  Laterality: N/A;   INSERTION OF MESH N/A 05/29/2018   Procedure: INSERTION OF MESH;  Surgeon: Johnathan Hausen, MD;  Location: [facility] ORS;  Service: General;  Laterality: N/A;   QUADRICEPS TENDON REPAIR Right 12/05/2020   Procedure: OPEN REPAIR OF RIGHT GLUTEUS MINIMUS TENDON;  Surgeon: Corky Mull, MD;  Location: [facility] ORS;  Service: Orthopedics;  Laterality: Right;   right gluteus medius/minimus repair Dr. Roland Rack 11/2020      Dr. Roland Rack   SHOULDER SURGERY Left 2008, 2009   x2 rotator cuff surgeries left shoulders   SPINAL FUSION  07/24/2020   SPINE SURGERY  10/23/2017   spinal 09/2017 h/o scoliosis [facility] L4-S1 OLIF T10 to ililum fusion   TONSILLECTOMY     age 39 y.o.    TOTAL KNEE ARTHROPLASTY Left 03/18/2022   Procedure: TOTAL KNEE ARTHROPLASTY;  Surgeon: Willaim Sheng, MD;  Location: [facility] ORS;  Service: Orthopedics;  Laterality: Left;   TOTAL SHOULDER REPLACEMENT Right 2012   TUBAL LIGATION     age 19   VENTRAL HERNIA REPAIR N/A 05/29/2018   Procedure: LAPAROSCOPIC VENTRAL / [place];  Surgeon: Johnathan Hausen, MD;  Location: [facility] ORS;  Service: General;  Laterality: N/A;    Home Medications:  Allergies as of 07/01/2022       Reactions   Sulfa Antibiotics Anaphylaxis, Hives, Itching, Shortness Of Breath, Swelling   Lips & throat swelled   Nitrofuran Derivatives Swelling   Lip swelling; "eyes get red and puffy"   Amlodipine Besylate Other (See Comments)   gas, bloating  Heart racing         Medication List         Accurate as of July 01, 2022  9:45 AM. If you have any questions, ask your nurse or doctor.          atorvastatin 20 MG tablet Commonly known as: LIPITOR Take 1 tablet (20 mg total) by mouth daily.   cholecalciferol 25 MCG (1000 UNIT) tablet Commonly known as: VITAMIN D3 Take 1,000 Units by mouth daily.   [employer] CALCIUM 600 & VITAMIN D3 PO Take 1 tablet by mouth daily.  cyanocobalamin 1000 MCG tablet Commonly known as: VITAMIN B12 Take 1,000 mcg by mouth daily.   D-MANNOSE PO Take 1,300 mg by mouth daily.   levothyroxine 88 MCG tablet Commonly known as: SYNTHROID Take 1 tablet (88 mcg total) by mouth daily before breakfast. 30 minutes   metoprolol succinate 50 MG 24 hr tablet Commonly known as: TOPROL-XL Take 1 tablet (50 mg total) by mouth 2 (two) times daily. Take with or immediately following a meal.   Multi-Vitamins Tabs Take 1 tablet by mouth daily.   PROBIOTIC PO Take 1 tablet by mouth daily.   telmisartan 80 MG tablet Commonly known as: MICARDIS Take 1 tablet (80 mg total) by mouth daily.   zolpidem 5 MG tablet Commonly known as: AMBIEN Take 1 tablet (5 mg total) by mouth at bedtime as needed.        Allergies:  Allergies  Allergen Reactions   Sulfa Antibiotics Anaphylaxis, Hives, Itching, Shortness Of Breath and Swelling    Lips & throat swelled   Nitrofuran Derivatives Swelling    Lip swelling; "eyes get red and puffy"   Amlodipine Besylate Other (See Comments)    gas, bloating  Heart racing     Family History: Family History  Problem Relation Age of Onset   Arthritis Mother    Heart disease Mother 4       stent    Hyperlipidemia Mother    Hypertension Mother    Coronary artery disease Mother 82   Arthritis Father    Diabetes Father    Cancer Father        colon   AAA (abdominal aortic aneurysm) Father    Arthritis Sister    Hyperlipidemia Sister    Hypertension Sister    Cancer Brother        lung, smoker   Mental retardation  Brother    Drug abuse Daughter        overdose in 2018    Arthritis Sister    Hyperlipidemia Sister    Bladder Cancer Neg Hx    Kidney cancer Neg Hx    Breast cancer Neg Hx     Social History:  reports that she has never smoked. She has never been exposed to tobacco smoke. She has never used smokeless tobacco. She reports that she does not currently use alcohol. She reports that she does not use drugs.  ROS:                                        Physical Exam: BP (!) 166/83   Pulse 68   Ht '4\' 11"'$  (1.499 m)   Wt 62.1 kg   BMI 27.67 kg/m   Constitutional:  Alert and oriented, No acute distress. HEENT: [place] AT, moist mucus membranes.  Trachea midline, no masses.   Laboratory Data: Lab Results  Component Value Date   WBC 5.5 05/23/2022   HGB 13.4 05/23/2022   HCT 41.4 05/23/2022   MCV 87.1 05/23/2022   PLT 227.0 05/23/2022    Lab Results  Component Value Date   CREATININE 0.49 03/19/2022    No results found for: "PSA"  No results found for: "TESTOSTERONE"  Lab Results  Component Value Date   HGBA1C 5.9 05/23/2022    Urinalysis    Component Value Date/Time   COLORURINE STRAW (A) 03/11/2022 0917   APPEARANCEUR Cloudy (A) 06/07/2022 1047   LABSPEC 1.006 03/11/2022  [place] 5.0 03/11/2022 0917   GLUCOSEU Negative 06/07/2022 [place] 09/22/2018 1416   [place] 03/11/2022 0917   BILIRUBINUR Negative 06/07/2022 1047   KETONESUR NEGATIVE 03/11/2022 0917   PROTEINUR Negative 06/07/2022 1047   PROTEINUR NEGATIVE 03/11/2022 0917   UROBILINOGEN 1.0 01/24/2020 1107   UROBILINOGEN 0.2 09/22/2018 1416   NITRITE Positive (A) 06/07/2022 1047   NITRITE NEGATIVE 03/11/2022 0917   LEUKOCYTESUR 2+ (A) 06/07/2022 1047   LEUKOCYTESUR NEGATIVE 03/11/2022 0917    Pertinent Imaging:   Assessment & Plan: I will see her as needed  1. Recurrent UTI  - Urinalysis, Complete   No follow-ups on file.  Reece Packer, MD  [facility] [address], [place], [place] [address] [phone]

## 2022-07-11 ENCOUNTER — Ambulatory Visit
Admission: RE | Admit: 2022-07-11 | Discharge: 2022-07-11 | Disposition: A | Discharge status: Home / Self Care | Payer: Medicare Other | Source: Ambulatory Visit | Attending: Orthopedic Surgery | Admitting: Orthopedic Surgery

## 2022-07-11 DIAGNOSIS — M4325 Fusion of spine, thoracolumbar region: Secondary | ICD-10-CM | POA: Diagnosis not present

## 2022-07-11 DIAGNOSIS — M25552 Pain in left hip: Secondary | ICD-10-CM | POA: Diagnosis not present

## 2022-07-11 DIAGNOSIS — G8929 Other chronic pain: Secondary | ICD-10-CM

## 2022-07-11 DIAGNOSIS — M533 Sacrococcygeal disorders, not elsewhere classified: Secondary | ICD-10-CM | POA: Diagnosis not present

## 2022-07-16 ENCOUNTER — Other Ambulatory Visit: Payer: Self-pay | Admitting: Orthopedic Surgery

## 2022-07-16 DIAGNOSIS — M542 Cervicalgia: Secondary | ICD-10-CM | POA: Diagnosis not present

## 2022-07-16 DIAGNOSIS — M5451 Vertebrogenic low back pain: Secondary | ICD-10-CM | POA: Diagnosis not present

## 2022-07-16 DIAGNOSIS — M545 Low back pain, unspecified: Secondary | ICD-10-CM

## 2022-07-22 ENCOUNTER — Other Ambulatory Visit: Payer: Self-pay | Admitting: Orthopedic Surgery

## 2022-07-22 DIAGNOSIS — M542 Cervicalgia: Secondary | ICD-10-CM

## 2022-07-25 ENCOUNTER — Ambulatory Visit: Payer: Medicare Other

## 2022-07-26 ENCOUNTER — Ambulatory Visit (HOSPITAL_COMMUNITY)
Admission: RE | Admit: 2022-07-26 | Discharge: 2022-07-26 | Disposition: A | Discharge status: Home / Self Care | Payer: Medicare Other | Source: Ambulatory Visit | Attending: Cardiology | Admitting: Cardiology

## 2022-07-26 DIAGNOSIS — R29898 Other symptoms and signs involving the musculoskeletal system: Secondary | ICD-10-CM | POA: Diagnosis not present

## 2022-07-30 ENCOUNTER — Other Ambulatory Visit: Payer: Medicare Other

## 2022-08-01 ENCOUNTER — Ambulatory Visit
Admission: RE | Admit: 2022-08-01 | Discharge: 2022-08-01 | Disposition: A | Discharge status: Home / Self Care | Payer: Medicare Other | Source: Ambulatory Visit | Attending: Orthopedic Surgery | Admitting: Orthopedic Surgery

## 2022-08-01 DIAGNOSIS — G8929 Other chronic pain: Secondary | ICD-10-CM

## 2022-08-01 DIAGNOSIS — M533 Sacrococcygeal disorders, not elsewhere classified: Secondary | ICD-10-CM | POA: Diagnosis not present

## 2022-08-01 DIAGNOSIS — M545 Low back pain, unspecified: Secondary | ICD-10-CM | POA: Diagnosis not present

## 2022-08-05 ENCOUNTER — Ambulatory Visit
Admission: RE | Admit: 2022-08-05 | Discharge: 2022-08-05 | Disposition: A | Discharge status: Home / Self Care | Payer: Medicare Other | Source: Ambulatory Visit | Attending: Orthopedic Surgery | Admitting: Orthopedic Surgery

## 2022-08-05 DIAGNOSIS — M47812 Spondylosis without myelopathy or radiculopathy, cervical region: Secondary | ICD-10-CM | POA: Diagnosis not present

## 2022-08-05 DIAGNOSIS — M4802 Spinal stenosis, cervical region: Secondary | ICD-10-CM | POA: Diagnosis not present

## 2022-08-05 DIAGNOSIS — M542 Cervicalgia: Secondary | ICD-10-CM

## 2022-08-22 DIAGNOSIS — Z23 Encounter for immunization: Secondary | ICD-10-CM | POA: Diagnosis not present

## 2022-09-02 DIAGNOSIS — G629 Polyneuropathy, unspecified: Secondary | ICD-10-CM | POA: Diagnosis not present

## 2022-09-02 DIAGNOSIS — Z20828 Contact with and (suspected) exposure to other viral communicable diseases: Secondary | ICD-10-CM | POA: Diagnosis not present

## 2022-09-06 DIAGNOSIS — M545 Low back pain, unspecified: Secondary | ICD-10-CM | POA: Diagnosis not present

## 2022-10-03 ENCOUNTER — Ambulatory Visit: Payer: Medicare Other | Admitting: Internal Medicine

## 2022-10-25 DIAGNOSIS — M545 Low back pain, unspecified: Secondary | ICD-10-CM | POA: Diagnosis not present

## 2022-10-29 DIAGNOSIS — M25559 Pain in unspecified hip: Secondary | ICD-10-CM | POA: Diagnosis not present

## 2022-10-29 DIAGNOSIS — M25551 Pain in right hip: Secondary | ICD-10-CM | POA: Diagnosis not present

## 2022-11-01 DIAGNOSIS — M25551 Pain in right hip: Secondary | ICD-10-CM | POA: Diagnosis not present

## 2022-11-04 DIAGNOSIS — M7061 Trochanteric bursitis, right hip: Secondary | ICD-10-CM | POA: Diagnosis not present

## 2022-11-04 DIAGNOSIS — M7062 Trochanteric bursitis, left hip: Secondary | ICD-10-CM | POA: Diagnosis not present

## 2022-11-05 DIAGNOSIS — M25551 Pain in right hip: Secondary | ICD-10-CM | POA: Diagnosis not present

## 2022-11-05 DIAGNOSIS — M160 Bilateral primary osteoarthritis of hip: Secondary | ICD-10-CM | POA: Diagnosis not present

## 2022-11-14 DIAGNOSIS — H26492 Other secondary cataract, left eye: Secondary | ICD-10-CM | POA: Diagnosis not present

## 2022-11-20 DIAGNOSIS — M160 Bilateral primary osteoarthritis of hip: Secondary | ICD-10-CM | POA: Diagnosis not present

## 2022-11-20 DIAGNOSIS — M7062 Trochanteric bursitis, left hip: Secondary | ICD-10-CM | POA: Diagnosis not present

## 2022-11-20 DIAGNOSIS — M7061 Trochanteric bursitis, right hip: Secondary | ICD-10-CM | POA: Diagnosis not present

## 2022-11-28 ENCOUNTER — Encounter: Payer: Medicare Other | Admitting: Family Medicine

## 2022-12-04 DIAGNOSIS — M25551 Pain in right hip: Secondary | ICD-10-CM | POA: Diagnosis not present

## 2022-12-04 DIAGNOSIS — M25552 Pain in left hip: Secondary | ICD-10-CM | POA: Diagnosis not present

## 2022-12-04 DIAGNOSIS — M810 Age-related osteoporosis without current pathological fracture: Secondary | ICD-10-CM | POA: Diagnosis not present

## 2022-12-11 DIAGNOSIS — R2689 Other abnormalities of gait and mobility: Secondary | ICD-10-CM | POA: Diagnosis not present

## 2022-12-11 DIAGNOSIS — M25552 Pain in left hip: Secondary | ICD-10-CM | POA: Diagnosis not present

## 2022-12-11 DIAGNOSIS — M6281 Muscle weakness (generalized): Secondary | ICD-10-CM | POA: Diagnosis not present

## 2022-12-11 DIAGNOSIS — M25551 Pain in right hip: Secondary | ICD-10-CM | POA: Diagnosis not present

## 2022-12-17 ENCOUNTER — Ambulatory Visit (INDEPENDENT_AMBULATORY_CARE_PROVIDER_SITE_OTHER): Payer: Medicare Other | Admitting: Internal Medicine

## 2022-12-17 ENCOUNTER — Encounter: Payer: Self-pay | Admitting: Internal Medicine

## 2022-12-17 VITALS — BP 134/86 | HR 76 | Temp 98.0°F | Ht 59.0 in | Wt 141.0 lb

## 2022-12-17 DIAGNOSIS — M6281 Muscle weakness (generalized): Secondary | ICD-10-CM | POA: Diagnosis not present

## 2022-12-17 DIAGNOSIS — J0101 Acute recurrent maxillary sinusitis: Secondary | ICD-10-CM | POA: Diagnosis not present

## 2022-12-17 DIAGNOSIS — M25551 Pain in right hip: Secondary | ICD-10-CM | POA: Diagnosis not present

## 2022-12-17 DIAGNOSIS — J029 Acute pharyngitis, unspecified: Secondary | ICD-10-CM | POA: Insufficient documentation

## 2022-12-17 DIAGNOSIS — R2689 Other abnormalities of gait and mobility: Secondary | ICD-10-CM | POA: Diagnosis not present

## 2022-12-17 DIAGNOSIS — M25552 Pain in left hip: Secondary | ICD-10-CM | POA: Diagnosis not present

## 2022-12-17 LAB — POCT RAPID STREP A (OFFICE): Rapid Strep A Screen: NEGATIVE

## 2022-12-17 MED ORDER — AMOXICILLIN-POT CLAVULANATE 875-125 MG PO TABS: 1.0000 | ORAL_TABLET | Freq: Two times a day (BID) | ORAL | 0 refills | Status: DC

## 2022-12-17 MED ORDER — PREDNISONE 10 MG PO TABS: ORAL_TABLET | ORAL | 0 refills | Status: DC

## 2022-12-17 NOTE — Assessment & Plan Note (Addendum)
Rapid strep was negative, ordered to rule out strep as the cause of persistent symptoms x 3 weeks

## 2022-12-17 NOTE — Progress Notes (Unsigned)
Subjective:  Patient ID: Maria Spencer, female    DOB: 11-18-1946  Age: 77 y.o. MRN: [id number]  CC: The primary encounter diagnosis was Pharyngitis, unspecified etiology. A diagnosis of Acute recurrent maxillary sinusitis was also pertinent to this visit.   HPI Maria Spencer presents for  Chief Complaint  Patient presents with   Nasal Congestion    Bloody nasal congestion, sinus headache, sinus pain and pressure, right ear pain, sore throat, lightheaded since New Years. No cough, no fever, no body aches, no chills. Pt has taken zyrtec, mucinex, advil, and nasal spray.    77 yr old [place] with chronic right sided sinus congestions,  frequent URI (> 3/yr) presents with persistet sinus congesition now accomapnied by  bloody nasal discharge and facial pain   .  Present sine Jan 1.   Last use of abx was over 6 months ago. Using advil. Mucinex,  zyrtec ,  no decongestants.  Now having vertigo at night.       Outpatient Medications Prior to Visit  Medication Sig Dispense Refill   atorvastatin (LIPITOR) 20 MG tablet Take 1 tablet (20 mg total) by mouth daily. 90 tablet 3   Calcium Carb-Cholecalciferol (CVS CALCIUM 600 & VITAMIN D3 PO) Take 1 tablet by mouth daily.     CELEBREX 100 MG capsule Take 100 mg by mouth 2 (two) times daily.     cholecalciferol (VITAMIN D) 25 MCG (1000 UNIT) tablet Take 1,000 Units by mouth daily.     D-MANNOSE PO Take 1,300 mg by mouth daily.     levothyroxine (SYNTHROID) 88 MCG tablet Take 1 tablet (88 mcg total) by mouth daily before breakfast. 30 minutes 90 tablet 3   Multiple Vitamin (MULTI-VITAMINS) TABS Take 1 tablet by mouth daily.     telmisartan (MICARDIS) 80 MG tablet Take 1 tablet (80 mg total) by mouth daily. 90 tablet 3   vitamin B-12 (CYANOCOBALAMIN) 1000 MCG tablet Take 1,000 mcg by mouth daily.     zolpidem (AMBIEN) 5 MG tablet Take 1 tablet (5 mg total) by mouth at bedtime as needed. 90 tablet 1   metoprolol succinate (TOPROL-XL) 50 MG 24 hr  tablet Take 1 tablet (50 mg total) by mouth 2 (two) times daily. Take with or immediately following a meal. 180 tablet 0   No facility-administered medications prior to visit.    Review of Systems;  Patient denies , fevers, , unintentional weight loss, skin rash, eye pain, , dysphagia,  hemoptysis , cough, dyspnea, wheezing, chest pain, palpitations, orthopnea, edema, abdominal pain, nausea, melena, diarrhea, constipation, flank pain, dysuria, hematuria, urinary  Frequency, nocturia, numbness, tingling, seizures,  Focal weakness, Loss of consciousness,  Tremor, insomnia, depression, anxiety, and suicidal ideation.      Objective:  BP 134/86   Pulse 76   Temp 98 F (36.7 C) (Oral)   Ht '4\' 11"'$  (1.499 m)   Wt 141 lb (64 kg)   SpO2 95%   BMI 28.48 kg/m   BP Readings from Last 3 Encounters:  12/17/22 134/86  08/01/22 (!) 188/74  07/01/22 (!) 166/83    Wt Readings from Last 3 Encounters:  12/17/22 141 lb (64 kg)  07/01/22 137 lb (62.1 kg)  06/26/22 135 lb (61.2 kg)    Physical Exam Vitals reviewed.  Constitutional:      General: She is not in acute distress.    Appearance: Normal appearance. She is normal weight. She is not ill-appearing, toxic-appearing or diaphoretic.  HENT:     Head:  Normocephalic.     Left Ear: Tympanic membrane, ear canal and external ear normal.     Nose: Congestion present.     Mouth/Throat:     Pharynx: Posterior oropharyngeal erythema present.  Eyes:     General: No scleral icterus.       Right eye: No discharge.        Left eye: No discharge.     Conjunctiva/sclera: Conjunctivae normal.  Musculoskeletal:        General: Normal range of motion.  Lymphadenopathy:     Cervical: Cervical adenopathy present.  Skin:    General: Skin is warm and dry.  Neurological:     General: No focal deficit present.     Mental Status: She is alert and oriented to person, place, and time. Mental status is at baseline.  Psychiatric:        Mood and Affect:  Mood normal.        Behavior: Behavior normal.        Thought Content: Thought content normal.        Judgment: Judgment normal.    Lab Results  Component Value Date   HGBA1C 5.9 05/23/2022   HGBA1C 6.0 10/08/2021   HGBA1C 5.7 02/06/2021    Lab Results  Component Value Date   CREATININE 0.49 03/19/2022   CREATININE 0.54 03/11/2022   CREATININE 0.60 02/12/2022    Lab Results  Component Value Date   WBC 5.5 05/23/2022   HGB 13.4 05/23/2022   HCT 41.4 05/23/2022   PLT 227.0 05/23/2022   GLUCOSE 157 (H) 03/19/2022   CHOL 222 (H) 05/23/2022   TRIG 140.0 05/23/2022   HDL 77.20 05/23/2022   LDLCALC 116 (H) 05/23/2022   ALT 15 03/11/2022   AST 19 03/11/2022   NA 136 03/19/2022   K 3.9 03/19/2022   CL 104 03/19/2022   CREATININE 0.49 03/19/2022   BUN 12 03/19/2022   CO2 24 03/19/2022   TSH 2.98 05/23/2022   INR 1.0 03/15/2021   HGBA1C 5.9 05/23/2022    MR CERVICAL SPINE WO CONTRAST  Result Date: 08/06/2022 CLINICAL DATA:  Chronic neck and bilateral shoulder pain. History of thoracolumbar fusion. EXAM: MRI CERVICAL SPINE WITHOUT CONTRAST TECHNIQUE: Multiplanar, multisequence MR imaging of the cervical spine was performed. No intravenous contrast was administered. COMPARISON:  MRI cervical spine dated September 26, 2020. FINDINGS: Alignment: Unchanged straightening of the normal cervical lordosis and trace anterolisthesis at C7-T1. Vertebrae: No fracture, evidence of discitis, or bone lesion. Progressive degenerative endplate marrow edema from C4-C7. Posterior thoracic spine fusion hardware beginning at T3. Cord: Normal signal and morphology. Posterior Fossa, vertebral arteries, paraspinal tissues: Negative. Disc levels: C2-C3: New small shallow left paracentral disc protrusion. Progressive advanced left facet arthropathy with mild perifacet degenerative marrow edema. New mild left neuroforaminal stenosis. No spinal canal or right neuroforaminal stenosis. C3-C4: Unchanged mild disc  bulging with moderate left and mild right facet uncovertebral hypertrophy. Unchanged moderate left and mild right neuroforaminal stenosis. No spinal canal stenosis. C4-C5: Unchanged bulky circumferential disc osteophyte complex and advanced bilateral uncovertebral hypertrophy. Progressive severe right facet arthropathy with new prominent perifacet degenerative marrow edema. Unchanged mild spinal canal stenosis. Unchanged moderate to severe left and progressive moderate to severe right neuroforaminal stenosis. C5-C6: Unchanged small posterior disc osteophyte complex and advanced bilateral uncovertebral hypertrophy. Unchanged mild spinal canal and moderate bilateral neuroforaminal stenosis. C6-C7: Unchanged small circumferential disc osteophyte complex and mild bilateral facet uncovertebral hypertrophy. Unchanged mild spinal canal stenosis. Unchanged moderate left neuroforaminal stenosis.  No right neuroforaminal stenosis. C7-T1: Unchanged small circumferential disc osteophyte complex and mild bilateral facet arthropathy. Unchanged mild left-greater-than-right neuroforaminal stenosis. No spinal canal stenosis. T1-T2: Unchanged circumferential disc osteophyte complex and mild bilateral facet arthropathy. Unchanged mild right neuroforaminal stenosis. No spinal canal or left neuroforaminal stenosis. T2-T3: Unchanged mild disc bulging and bilateral facet arthropathy. No stenosis. T3-T4 and T4-T5: Prior posterior fusion.  No residual stenosis. IMPRESSION: 1. Progressive advanced facet arthropathy on the left at C2-C3 and on the right at C4-C5 with degenerative perifacet marrow edema, which can be a source of pain. 2. Multilevel cervical spondylosis as described above, progressed at C2-C3 and C4-C5. Findings remain worst at C4-C5 where there is unchanged moderate to severe left and progressive moderate to severe right neuroforaminal stenosis. Electronically Signed   By: Titus Dubin M.D.   On: 08/06/2022 12:02     Assessment & Plan:  .Pharyngitis, unspecified etiology Assessment & Plan: Rapid strep ordered to rule out strep as the cause of persistent symptoms x 3 weeks   Orders: -     POCT rapid strep A  Acute recurrent maxillary sinusitis Assessment & Plan: Augmentin bid x 10 days,  prednisone taper,  ENT referral for evaluation of sinuses   Orders: -     Ambulatory referral to ENT  Other orders -     Amoxicillin-Pot Clavulanate; Take 1 tablet by mouth 2 (two) times daily.  Dispense: 20 tablet; Refill: 0 -     predniSONE; 6 tablets daily for 3 days, then reduce by 1 tablet daily until gone  Dispense: 33 tablet; Refill: 0    Follow-up: No follow-ups on file.   Crecencio Mc, MD

## 2022-12-17 NOTE — Patient Instructions (Addendum)
I am treating you for bacterial sinusitis and right sided otitis media  I am prescribing an antibiotic (augmentin ) and a prednisone taper  To manage the infection and the inflammation in your ear/sinuses.  It will also cover strep throat.   I also advise use of the following OTC meds to help with your other symptoms.   Take mucinex DM for the drainage and cough   flush your sinuses twice daily with NeilMed sinus rinse  (do over the sink because if you do it right you will spit out globs of mucus)  Please continue to  take your probiotic to prevent a very serious antibiotic associated infection  Called clostridium dificile colitis that can cause diarrhea, multi organ failure, sepsis and death if not managed.

## 2022-12-17 NOTE — Assessment & Plan Note (Signed)
Augmentin bid x 10 days,  prednisone taper,  ENT referral for evaluation of sinuses

## 2022-12-19 DIAGNOSIS — M25552 Pain in left hip: Secondary | ICD-10-CM | POA: Diagnosis not present

## 2022-12-19 DIAGNOSIS — R2689 Other abnormalities of gait and mobility: Secondary | ICD-10-CM | POA: Diagnosis not present

## 2022-12-19 DIAGNOSIS — M25551 Pain in right hip: Secondary | ICD-10-CM | POA: Diagnosis not present

## 2022-12-19 DIAGNOSIS — M6281 Muscle weakness (generalized): Secondary | ICD-10-CM | POA: Diagnosis not present

## 2022-12-26 DIAGNOSIS — M25551 Pain in right hip: Secondary | ICD-10-CM | POA: Diagnosis not present

## 2022-12-26 DIAGNOSIS — M6281 Muscle weakness (generalized): Secondary | ICD-10-CM | POA: Diagnosis not present

## 2022-12-26 DIAGNOSIS — M25552 Pain in left hip: Secondary | ICD-10-CM | POA: Diagnosis not present

## 2022-12-26 DIAGNOSIS — R2689 Other abnormalities of gait and mobility: Secondary | ICD-10-CM | POA: Diagnosis not present

## 2022-12-30 DIAGNOSIS — H35372 Puckering of macula, left eye: Secondary | ICD-10-CM | POA: Diagnosis not present

## 2022-12-30 DIAGNOSIS — H26492 Other secondary cataract, left eye: Secondary | ICD-10-CM | POA: Diagnosis not present

## 2022-12-31 DIAGNOSIS — M6281 Muscle weakness (generalized): Secondary | ICD-10-CM | POA: Diagnosis not present

## 2022-12-31 DIAGNOSIS — M25552 Pain in left hip: Secondary | ICD-10-CM | POA: Diagnosis not present

## 2022-12-31 DIAGNOSIS — M545 Low back pain, unspecified: Secondary | ICD-10-CM | POA: Diagnosis not present

## 2022-12-31 DIAGNOSIS — M25562 Pain in left knee: Secondary | ICD-10-CM | POA: Diagnosis not present

## 2022-12-31 DIAGNOSIS — M25561 Pain in right knee: Secondary | ICD-10-CM | POA: Diagnosis not present

## 2022-12-31 DIAGNOSIS — R2689 Other abnormalities of gait and mobility: Secondary | ICD-10-CM | POA: Diagnosis not present

## 2022-12-31 DIAGNOSIS — M25551 Pain in right hip: Secondary | ICD-10-CM | POA: Diagnosis not present

## 2023-01-07 DIAGNOSIS — M6281 Muscle weakness (generalized): Secondary | ICD-10-CM | POA: Diagnosis not present

## 2023-01-07 DIAGNOSIS — R2689 Other abnormalities of gait and mobility: Secondary | ICD-10-CM | POA: Diagnosis not present

## 2023-01-07 DIAGNOSIS — M25552 Pain in left hip: Secondary | ICD-10-CM | POA: Diagnosis not present

## 2023-01-07 DIAGNOSIS — M25551 Pain in right hip: Secondary | ICD-10-CM | POA: Diagnosis not present

## 2023-01-09 DIAGNOSIS — M25552 Pain in left hip: Secondary | ICD-10-CM | POA: Diagnosis not present

## 2023-01-09 DIAGNOSIS — R2689 Other abnormalities of gait and mobility: Secondary | ICD-10-CM | POA: Diagnosis not present

## 2023-01-09 DIAGNOSIS — M6281 Muscle weakness (generalized): Secondary | ICD-10-CM | POA: Diagnosis not present

## 2023-01-09 DIAGNOSIS — M25551 Pain in right hip: Secondary | ICD-10-CM | POA: Diagnosis not present

## 2023-01-14 ENCOUNTER — Telehealth: Payer: Self-pay | Admitting: Family Medicine

## 2023-01-14 NOTE — Telephone Encounter (Signed)
Pt called in staying that she is having surgery 4/15 and need some clearance from provider. She hasn't had a TOC as of yet, but I made an appt for 3/20, she also wants to know if she can blood work prior to apt. She cancel her TOC in May because after surgery she wont be able to drive for 8 weeks.

## 2023-01-15 NOTE — Telephone Encounter (Signed)
Pt advised.

## 2023-01-16 ENCOUNTER — Ambulatory Visit (INDEPENDENT_AMBULATORY_CARE_PROVIDER_SITE_OTHER): Payer: Medicare Other | Admitting: Neurosurgery

## 2023-01-16 VITALS — BP 134/78 | Ht 59.0 in | Wt 140.6 lb

## 2023-01-16 DIAGNOSIS — M47819 Spondylosis without myelopathy or radiculopathy, site unspecified: Secondary | ICD-10-CM

## 2023-01-16 DIAGNOSIS — M545 Low back pain, unspecified: Secondary | ICD-10-CM

## 2023-01-16 DIAGNOSIS — Z981 Arthrodesis status: Secondary | ICD-10-CM | POA: Diagnosis not present

## 2023-01-16 DIAGNOSIS — M542 Cervicalgia: Secondary | ICD-10-CM | POA: Diagnosis not present

## 2023-01-16 NOTE — Progress Notes (Signed)
Referring Physician:  No referring provider defined for this encounter.  Primary Physician:  Carollee Leitz, MD  History of Present Illness: 01/16/2023 [name] is a 77 year old with a past medical history of osteoporosis, recurrent UTIs, breast cancer status post right lumpectomy with radiation in 2009, for lipidemia, hypertension, hypothyroidism, who is here today with a chief complaint of neck and lumbar complaints. In regards to her lumbar spine, she states that she did well with her last surgery until about 6 months ago when she began to have pain particularly in the left side of her low back.  She states she also has weakness and numbness and tingling in her anterior thighs.  Her primary concern is her left-sided low back pain and this effect on her ambulation.  She states that she underwent physical therapy at [facility] which worsened her back pain.  She states she did have a SI joint injection which did not provide any significant relief. In regards to her cervical spine, she states she has had neck pain for about 5 years with progressive worsening.  She describes pain in her neck that starts at the base of her skull and radiates into her scalp bilaterally.  She states that it feels like nerve pain.  She also describes pain between her shoulder blades particularly on the left-hand side without radiating pain, numbness, or tingling down her arms.  She recalls getting an injection for this many years ago but does not recall her response.  She has not had any recent physical therapy for her neck.  Conservative measures:  Physical therapy: Physical therapy for her lumbar spine at [facility] without any significant relief.  Past Surgery: Prior thoracolumbar fusion  Dorlene Gensheimer has no symptoms of cervical myelopathy.  The symptoms are causing a significant impact on the patient's life.   Review of Systems:  A 10 point review of systems is negative, except for the pertinent  positives and negatives detailed in the HPI.  Past Medical History: Past Medical History:  Diagnosis Date   Arthritis    neck, back, left knee;    Breast cancer ([place]) 2009   right lumpectomy s/p radiation x 1 week 2x per day only    Chicken pox    Chronic UTI    established with urology    COVID-19    ? 12/2020 sob  took antiviral, 08/15/21   Diverticular disease    -osis and -itis    Esophagitis    egd 05/17/14 see report scanned into chart   History of kidney stones    Hormone disorder    Hyperlipidemia    Hypertension    controlled well with medication;    Hypothyroidism    Osteoporosis    Personal history of radiation therapy 2009   F/U right breast cancer   Thyroid disease    hypothyroidism     Past Surgical History: Past Surgical History:  Procedure Laterality Date   BONE GRAFT HIP ILIAC CREST  2018   + cage left hip 10/23/17    BREAST BIOPSY Left 2009   clip,benign   BREAST BIOPSY Right 2009   +   BREAST LUMPECTOMY Right 2009   2009 lumpectomy    CARDIAC CATHETERIZATION  2001   No stents;  results were WNL Dr. requested it for HTN   CATARACT EXTRACTION, BILATERAL Bilateral 10/2019   COLONOSCOPY WITH PROPOFOL N/A 05/15/2020   Procedure: COLONOSCOPY WITH PROPOFOL;  Surgeon: Lin Landsman, MD;  Location: [facility];  Service:  Gastroenterology;  Laterality: N/A;   INSERTION OF MESH N/A 05/29/2018   Procedure: INSERTION OF MESH;  Surgeon: Johnathan Hausen, MD;  Location: [facility];  Service: General;  Laterality: N/A;   QUADRICEPS TENDON REPAIR Right 12/05/2020   Procedure: OPEN REPAIR OF RIGHT GLUTEUS MINIMUS TENDON;  Surgeon: Corky Mull, MD;  Location: [facility];  Service: Orthopedics;  Laterality: Right;   right gluteus medius/minimus repair Dr. Roland Rack 11/2020      Dr. Roland Rack   SHOULDER SURGERY Left 2008, 2009   x2 rotator cuff surgeries left shoulders   SPINAL FUSION  07/24/2020   SPINE SURGERY  10/23/2017   spinal 09/2017 h/o scoliosis [facility] L4-S1 OLIF  T10 to ililum fusion   TONSILLECTOMY     age 33 y.o.    TOTAL KNEE ARTHROPLASTY Left 03/18/2022   Procedure: TOTAL KNEE ARTHROPLASTY;  Surgeon: Willaim Sheng, MD;  Location: [facility];  Service: Orthopedics;  Laterality: Left;   TOTAL SHOULDER REPLACEMENT Right 2012   TUBAL LIGATION     age 56   VENTRAL HERNIA REPAIR N/A 05/29/2018   Procedure: LAPAROSCOPIC VENTRAL / LATERAL HERNIA REPAIR;  Surgeon: Johnathan Hausen, MD;  Location: [facility];  Service: General;  Laterality: N/A;    Allergies: Allergies as of 01/16/2023 - Review Complete 12/17/2022  Allergen Reaction Noted   Sulfa antibiotics Anaphylaxis, Hives, Itching, Shortness Of Breath, and Swelling 06/13/2015   Nitrofuran derivatives Swelling 01/05/2020   Amlodipine besylate Other (See Comments) 09/10/2019    Medications: Outpatient Encounter Medications as of 01/16/2023  Medication Sig   amoxicillin-clavulanate (AUGMENTIN) 875-125 MG tablet Take 1 tablet by mouth 2 (two) times daily.   atorvastatin (LIPITOR) 20 MG tablet Take 1 tablet (20 mg total) by mouth daily.   Calcium Carb-Cholecalciferol ([employer] CALCIUM 600 & VITAMIN D3 PO) Take 1 tablet by mouth daily.   CELEBREX 100 MG capsule Take 100 mg by mouth 2 (two) times daily.   cholecalciferol (VITAMIN D) 25 MCG (1000 UNIT) tablet Take 1,000 Units by mouth daily.   D-MANNOSE PO Take 1,300 mg by mouth daily.   levothyroxine (SYNTHROID) 88 MCG tablet Take 1 tablet (88 mcg total) by mouth daily before breakfast. 30 minutes   metoprolol succinate (TOPROL-XL) 50 MG 24 hr tablet Take 1 tablet (50 mg total) by mouth 2 (two) times daily. Take with or immediately following a meal.   Multiple Vitamin (MULTI-VITAMINS) TABS Take 1 tablet by mouth daily.   predniSONE (DELTASONE) 10 MG tablet 6 tablets daily for 3 days, then reduce by 1 tablet daily until gone   telmisartan (MICARDIS) 80 MG tablet Take 1 tablet (80 mg total) by mouth daily.   vitamin B-12 (CYANOCOBALAMIN) 1000 MCG tablet Take  1,000 mcg by mouth daily.   zolpidem (AMBIEN) 5 MG tablet Take 1 tablet (5 mg total) by mouth at bedtime as needed.   No facility-administered encounter medications on file as of 01/16/2023.    Social History: Social History   Tobacco Use   Smoking status: Never    Passive exposure: Never   Smokeless tobacco: Never  Vaping Use   Vaping Use: Never used  Substance Use Topics   Alcohol use: Not Currently    Comment: rare   Drug use: No    Family Medical History: Family History  Problem Relation Age of Onset   Arthritis Mother    Heart disease Mother 28       stent    Hyperlipidemia Mother    Hypertension Mother  Coronary artery disease Mother 3   Arthritis Father    Diabetes Father    Cancer Father        colon   AAA (abdominal aortic aneurysm) Father    Arthritis Sister    Hyperlipidemia Sister    Hypertension Sister    Cancer Brother        lung, smoker   Mental retardation Brother    Drug abuse Daughter        overdose in 2018    Arthritis Sister    Hyperlipidemia Sister    Bladder Cancer Neg Hx    Kidney cancer Neg Hx    Breast cancer Neg Hx     Physical Examination:  Today's Vitals   01/16/23 1138  BP: 134/78  Weight: 63.8 kg  Height: 4' 11"$  (1.499 m)  PainSc: 7   PainLoc: Neck   Body mass index is 28.4 kg/m.   General: Patient is well developed, well nourished, calm, collected, and in no apparent distress. Attention to examination is appropriate.  Psychiatric: Patient is non-anxious.  Head:  Pupils equal, round, and reactive to light.  ENT:  Oral mucosa appears well hydrated.  Neck:   Supple.  Full range of motion.  Respiratory: Patient is breathing without any difficulty.  Extremities: No edema.  Vascular: Palpable dorsal pedal pulses.  Skin:   On exposed skin, there are no abnormal skin lesions.  NEUROLOGICAL:     Awake, alert, oriented to person, place, and time.  Speech is clear and fluent. Fund of knowledge is appropriate.    Cranial Nerves: Pupils equal round and reactive to light.  Facial tone is symmetric.  Facial sensation is symmetric.  ROM of spine: full.  Palpation of spine: TTP over left lower back around SI joint.   Strength: Side Biceps Triceps Deltoid Interossei Grip Wrist Ext. Wrist Flex.  R 5 5 5 5 5 5 5  $ L 5 5 5 5 5 5 5   $ Side Iliopsoas Quads Hamstring PF DF EHL  R 5 5 5 5 5 5  $ L 4+ 5 5 5 5 5   $ Reflexes are 2+ and symmetric at the biceps, triceps, brachioradialis, patella and achilles.   Hoffman's is absent.  Clonus is not present.  Toes are down-going.  Bilateral upper and lower extremity sensation is intact to light touch.    Patient ambulates with antalgic gait  Medical Decision Making  Imaging:  08/05/22 MRI C spine IMPRESSION: 1. Progressive advanced facet arthropathy on the left at C2-C3 and on the right at C4-C5 with degenerative perifacet marrow edema, which can be a source of pain. 2. Multilevel cervical spondylosis as described above, progressed at C2-C3 and C4-C5. Findings remain worst at C4-C5 where there is unchanged moderate to severe left and progressive moderate to severe right neuroforaminal stenosis.   Electronically Signed   By: Titus Dubin M.D.   On: 08/06/2022 12:02  05/20/22 MRI L spine  FINDINGS: Segmentation:  Standard.   Alignment: Trace anterolisthesis of L3 on L4 and L4 on L5 and 3-4 mm anterolisthesis of L5 on S1.   Vertebrae: Extensive posterior spinal fusion extending from the thoracic spine to the ileum as detailed on today's CT. Prior interbody fusion at L4-5 and L5-S1. Extensive magnetic susceptibility artifact limiting assessment for a marrow lesion. No gross fracture.   Conus medullaris and cauda equina: The conus and distal spinal cord are completely obscured by artifact. Grossly normal appearance of the cauda equina in the lower lumbar spine.  Paraspinal and other soft tissues: Postoperative changes in the posterior lumbar soft  tissues.   Disc levels:   Limited assessment of the spinal canal and neural foramina due to susceptibility artifact, particularly in the upper lumbar and lower thoracic spine. Wide patency of the spinal canal where adequately visualized.   IMPRESSION: Extensive susceptibility artifact from thoracolumbar fusion hardware which limits assessment. Wide patency of the spinal canal where adequately visualized.     Electronically Signed   By: Logan Bores M.D.   On: 05/20/2022 16:26   I have personally reviewed the images and agree with the above interpretation.  05/20/22 CT L spine FINDINGS: Segmentation: 5 lumbar type vertebrae.   Alignment: Mild lumbar levoscoliosis. Unchanged minimal anterolisthesis of L3 on L4 and L4 on L5 and 3-4 mm anterolisthesis of L5 on S1.   Vertebrae: No acute fracture or suspicious osseous lesion sequelae of extensive spinal fusion are again partially visualized with pedicle screws and interconnecting rods extending from the included thoracic spine inferiorly throughout the entire lumbar spine to the S1 level and with screws again noted traversing both SI joints. Solid posterior element arthrodesis is evident from T11-S1, and there is no evidence of screw loosening. Prior interbody fusion is again noted at L4-5 and L5-S1 with bridging bone at both levels.   Paraspinal and other soft tissues: Postoperative changes in the posterior lumbar soft tissues. Abdominal aortic atherosclerosis. Colonic diverticulosis.   Disc levels: Limited assessment of the spinal canal due to streak artifact and non-myelographic technique. No evidence of significant osseous spinal canal or neural foraminal stenosis.   IMPRESSION: 1. Extensive thoracolumbar spinal fusion with solid arthrodesis. 2. No evidence of significant osseous spinal canal or neural foraminal stenosis. 3. Aortic Atherosclerosis (ICD10-I70.0).     Electronically Signed   By: Logan Bores M.D.    On: 05/20/2022 16:20   EMG 09/02/22 Impression: This is a normal study without electrophysiologic evidence of lumbar radiculopathy, plexopathy, polyneuropathy, or myopathy    Assessment and Plan: Ms. Grella is a pleasant 77 y.o. female with lumbosacral complaints with about 6 months of worsening left-sided low back pain.  We discussed her MRI and CT scan results which are largely normal.  I do wonder if she is having some SI joint irritation from her screw and we will discuss this with [name] to see if there is any indication for surgical intervention.  In regards to her cervical spine, she does have significant facet arthropathy at C2-3 and C4-5 which may explain her neck and interscapular pain.  I have sent her to Dr. Alver Fisher for consideration of facet injections as she has seen him in the past for ESI's.  Improvement with this, we may consider a C4-5 ESI versus occipital nerve blocks.  In addition to this I have referred her for physical therapy for her neck.  I will see her back via telephone visit in 8 to 12 weeks to discuss her outcome with these conservative measures  Thank you for involving me in the care of this patient.   I spent a total of 72 minutes in both face-to-face and non-face-to-face activities for this visit on the date of this encounter including outside records, review of imaging, review of symptoms, physical exam, discussion of plan of care, documentation, and order placement.   [facility]

## 2023-01-20 NOTE — Progress Notes (Unsigned)
Cardiology Clinic Note   Patient Name: Maria Spencer Date of Encounter: 01/21/2023  Primary Care Provider:  Carollee Leitz, MD Primary Cardiologist:  Maria Bush, MD  Patient Profile    77 year old female with a past medical history of hypertension, hyperlipidemia, right breast cancer status postlumpectomy with radiation, hypothyroidism, and esophagitis, who presents today for follow-up on her hypertension and preoperative clearance.  Past Medical History    Past Medical History:  Diagnosis Date   Arthritis    neck, back, left knee;    Breast cancer ([place]) 2009   right lumpectomy s/p radiation x 1 week 2x per day only    Chicken pox    Chronic UTI    established with urology    COVID-19    ? 12/2020 sob  took antiviral, 08/15/21   Diverticular disease    -osis and -itis    Esophagitis    egd 05/17/14 see report scanned into chart   History of kidney stones    Hormone disorder    Hyperlipidemia    Hypertension    controlled well with medication;    Hypothyroidism    Osteoporosis    Personal history of radiation therapy 2009   F/U right breast cancer   Thyroid disease    hypothyroidism    Past Surgical History:  Procedure Laterality Date   BONE GRAFT HIP ILIAC CREST  2018   + cage left hip 10/23/17    BREAST BIOPSY Left 2009   clip,benign   BREAST BIOPSY Right 2009   +   BREAST LUMPECTOMY Right 2009   2009 lumpectomy    CARDIAC CATHETERIZATION  2001   No stents;  results were WNL Dr. requested it for HTN   CATARACT EXTRACTION, BILATERAL Bilateral 10/2019   COLONOSCOPY WITH PROPOFOL N/A 05/15/2020   Procedure: COLONOSCOPY WITH PROPOFOL;  Surgeon: Maria Landsman, MD;  Location: [facility] ENDOSCOPY;  Service: Gastroenterology;  Laterality: N/A;   INSERTION OF MESH N/A 05/29/2018   Procedure: INSERTION OF MESH;  Surgeon: Maria Hausen, MD;  Location: [facility] ORS;  Service: General;  Laterality: N/A;   QUADRICEPS TENDON REPAIR Right 12/05/2020   Procedure: OPEN  REPAIR OF RIGHT GLUTEUS MINIMUS TENDON;  Surgeon: Maria Mull, MD;  Location: [facility] ORS;  Service: Orthopedics;  Laterality: Right;   right gluteus medius/minimus repair Dr. Roland Spencer 11/2020      Dr. Roland Spencer   SHOULDER SURGERY Left 2008, 2009   x2 rotator cuff surgeries left shoulders   SPINAL FUSION  07/24/2020   SPINE SURGERY  10/23/2017   spinal 09/2017 h/o scoliosis [facility] L4-S1 OLIF T10 to ililum fusion   TONSILLECTOMY     age 89 y.o.    TOTAL KNEE ARTHROPLASTY Left 03/18/2022   Procedure: TOTAL KNEE ARTHROPLASTY;  Surgeon: Maria Sheng, MD;  Location: [facility] ORS;  Service: Orthopedics;  Laterality: Left;   TOTAL SHOULDER REPLACEMENT Right 2012   TUBAL LIGATION     age 66   VENTRAL HERNIA REPAIR N/A 05/29/2018   Procedure: LAPAROSCOPIC VENTRAL / LATERAL HERNIA REPAIR;  Surgeon: Maria Hausen, MD;  Location: [facility] ORS;  Service: General;  Laterality: N/A;    Allergies  Allergies  Allergen Reactions   Sulfa Antibiotics Anaphylaxis, Hives, Itching, Shortness Of Breath and Swelling    Lips & throat swelled   Nitrofuran Derivatives Swelling    Lip swelling; "eyes get red and puffy"   Amlodipine Besylate Other (See Comments)    gas, bloating  Heart racing  History of Present Illness    [name] is a 77 year old female with a previously mentioned past medical history of hypertension, hyperlipidemia, right breast cancer status postlumpectomy with radiation, hypothyroidism and esophagitis.  She had prior coronary CTA in 2020 that showed normal coronary arteries the significant motion artifact was present.  Echocardiogram 08/09/2021 revealed LVEF of 55 to 60%, no regional wall motion abnormalities, mild LVH, G1 DD, mild MR, tricuspid aortic valve without regurgitation.   He was last seen in clinic 06/26/2022 by Dr. Saunders Revel.  At that time she continued to be bothered by leg weakness that makes it difficult for her to walk.  She stated that it been ongoing for at least 2 years.  She had  also been evaluated by multiple specialist and was told that spine hardware may be contributing factor.  She noted intermittent chest pressure that comes and goes associated with transient palpitations.  Her metoprolol succinate was increased to 50 mg twice daily and she was also trialed on statin holiday to exclude statin myopathy as a potential cause of her leg weakness.  She returns to clinic today stating that she has been doing OK. She was scheduled for one surgery and then was having issues with her right knee and her surgery has been placed on hold until a determination can be made about what she needs done to her knee. She continues to have fatigue, weakness in her bilateral lower extremities, and occasional DOE which she thinks is from walking with a cane or walker. She denies any chest pain, chest tightness, palpitations, or peripheral edema. She denies any hospitalizations or visits to the emergency department.  Home Medications    Current Outpatient Medications  Medication Sig Dispense Refill   atorvastatin (LIPITOR) 20 MG tablet Take 1 tablet (20 mg total) by mouth daily. 90 tablet 3   Calcium Carb-Cholecalciferol ([employer] CALCIUM 600 & VITAMIN D3 PO) Take 1 tablet by mouth daily.     cholecalciferol (VITAMIN D) 25 MCG (1000 UNIT) tablet Take 1,000 Units by mouth daily.     D-MANNOSE PO Take 1,300 mg by mouth daily.     levothyroxine (SYNTHROID) 88 MCG tablet Take 1 tablet (88 mcg total) by mouth daily before breakfast. 30 minutes 90 tablet 3   Multiple Vitamin (MULTI-VITAMINS) TABS Take 1 tablet by mouth daily.     telmisartan (MICARDIS) 80 MG tablet Take 1 tablet (80 mg total) by mouth daily. 90 tablet 3   vitamin B-12 (CYANOCOBALAMIN) 1000 MCG tablet Take 1,000 mcg by mouth daily.     metoprolol succinate (TOPROL-XL) 50 MG 24 hr tablet Take 1 tablet (50 mg total) by mouth 2 (two) times daily. Take with or immediately following a meal. 180 tablet 0   No current facility-administered  medications for this visit.     Family History    Family History  Problem Relation Age of Onset   Arthritis Mother    Heart disease Mother 33       stent    Hyperlipidemia Mother    Hypertension Mother    Coronary artery disease Mother 79   Arthritis Father    Diabetes Father    Cancer Father        colon   AAA (abdominal aortic aneurysm) Father    Arthritis Sister    Hyperlipidemia Sister    Hypertension Sister    Cancer Brother        lung, smoker   Mental retardation Brother    Drug abuse Daughter  overdose in 2018    Arthritis Sister    Hyperlipidemia Sister    Bladder Cancer Neg Hx    Kidney cancer Neg Hx    Breast cancer Neg Hx    She indicated that her mother is deceased. She indicated that her father is alive. She indicated that both of her sisters are alive. She indicated that her brother is deceased. She indicated that her daughter is deceased. She indicated that both of her sons are alive. She indicated that the status of her neg hx is unknown.  Social History    Social History   Socioeconomic History   Marital status: Widowed    Spouse name: Not on file   Number of children: 2   Years of education: Not on file   Highest education level: Not on file  Occupational History   Not on file  Tobacco Use   Smoking status: Never    Passive exposure: Never   Smokeless tobacco: Never  Vaping Use   Vaping Use: Never used  Substance and Sexual Activity   Alcohol use: Not Currently    Comment: rare   Drug use: No   Sexual activity: Yes    Birth control/protection: Post-menopausal  Other Topics Concern   Not on file  Social History Narrative   College grad   widowed husband died 2019/07/07    Wears seatbelt and safe in relationship    3 kids : 1 daughter passed      [place] Strain: Low Risk  (10/09/2021)   Overall Financial Resource Strain (CARDIA)    Difficulty of Paying Living  Expenses: Not hard at all  Food Insecurity: No Food Insecurity (10/09/2021)   Hunger Vital Sign    Worried About Running Out of Food in the Last Year: Never true    [place] in the Last Year: Never true  Transportation Needs: No Transportation Needs (10/09/2021)   PRAPARE - Hydrologist (Medical): No    Lack of Transportation (Non-Medical): No  Physical Activity: Insufficiently Active (10/09/2021)   Exercise Vital Sign    Days of Exercise per Week: 2 days    Minutes of Exercise per Session: 60 min  Stress: Stress Concern Present (10/09/2021)   [place]    Feeling of Stress : To some extent  Social Connections: Unknown (10/09/2021)   Social Connection and Isolation Panel [NHANES]    Frequency of Communication with Friends and Family: More than three times a week    Frequency of Social Gatherings with Friends and Family: Not on file    Attends Religious Services: Not on file    Active Member of Clubs or Organizations: Yes    Attends Archivist Meetings: More than 4 times per year    Marital Status: Widowed  Intimate Partner Violence: Not At Risk (10/09/2021)   Humiliation, Afraid, Rape, and Kick questionnaire    Fear of Current or Ex-Partner: No    Emotionally Abused: No    Physically Abused: No    Sexually Abused: No     Review of Systems    General:  No chills, fever, night sweats or weight changes. Endorses fatigue Cardiovascular:  No chest pain, endorses dyspnea on exertion, but denies edema, orthopnea, palpitations, paroxysmal nocturnal dyspnea. Respiratory: No cough, endorses dyspnea Musculoskeletal: weakness to the bilateral lower extremities and altered mobility Neurologic:  No visual changes, wkns, changes in mental status. All other systems reviewed and are otherwise negative except as noted above.     Physical Exam    VS:  BP (!) 145/81 (BP Location:  Left Arm, Patient Position: Sitting, Cuff Size: Normal)   Pulse 77   Ht '4\' 11"'$  (1.499 m)   Wt 143 lb 3.2 oz (65 kg)   SpO2 98%   BMI 28.92 kg/m  , BMI Body mass index is 28.92 kg/m.     GEN: Well nourished, well developed, in no acute distress. Ambulating with a cane HEENT: normal. Neck: Supple, no JVD, carotid bruits, or masses. Cardiac: RRR, no murmurs, rubs, or gallops. No clubbing, cyanosis, edema.  Radials 2+/PT 2+ and equal bilaterally.  Respiratory:  Respirations regular and unlabored, clear to auscultation bilaterally. GI: Soft, nontender, nondistended, BS + x 4. Accessory Clinical Findings    ECG personally reviewed by me today- sinus rate of 77, left axis deviation, LVH, nonspecific ST abnormality - No acute changes  Lab Results  Component Value Date   WBC 5.5 05/23/2022   HGB 13.4 05/23/2022   HCT 41.4 05/23/2022   MCV 87.1 05/23/2022   PLT 227.0 05/23/2022   Lab Results  Component Value Date   CREATININE 0.49 03/19/2022   BUN 12 03/19/2022   NA 136 03/19/2022   K 3.9 03/19/2022   CL 104 03/19/2022   CO2 24 03/19/2022   Lab Results  Component Value Date   ALT 15 03/11/2022   AST 19 03/11/2022   ALKPHOS 72 03/11/2022   BILITOT 0.6 03/11/2022   Lab Results  Component Value Date   CHOL 222 (H) 05/23/2022   HDL 77.20 05/23/2022   LDLCALC 116 (H) 05/23/2022   TRIG 140.0 05/23/2022   CHOLHDL 3 05/23/2022    Lab Results  Component Value Date   HGBA1C 5.9 05/23/2022    Assessment & Plan   1.  Shortness of breath and DOE and fatigue. Denies any swelling but does endorse weight gain. Originally thought is was related to walking with a cane or walker but is now unsure. Last echo revealed LVEF 55-60% in 2022. Repeat study ordered today. She also has upcoming appointment with PCP to establish care and have blood work drawn.  2. Hypertension with blood pressure today 145/81 with repeat blood pressure 168/84. Previously she was encouraged to increase Toprol XL  to 50 mg twice daily, she unfortunately did not make that changes. After discussion today she will increase the dose to 50 mg twice daily. She is encouraged to continue to monitor blood pressure at home as well 1-2 hours after taking her medication.    3. Leg weakness that has been a chronic problem. Previously she had ABI's completed to rule out PAD. She has recently been scheduled for surgery but began with knee pain and is now having to have her knee evaluated prior to previous scheduled surgery.  4. Preoperative clearance of open repair of gluteus medius. With weakness, fatigue and dyspnea on exertion she is being scheduled for an echocardiogram prior to surgery. If her echocardiogram is unchanged and blood pressure is better controlled she will be cleared for her procedure.      Ms. [place] perioperative risk of a major cardiac event is 0.4% according to the Revised Cardiac Risk Index (RCRI).  Therefore, she is at low risk for perioperative complications.   Her functional capacity is fair at 5.07 METs according to the Duke Activity Status Index (DASI). Recommendations: The  patient requires an echocardiogram before a disposition can be made regarding surgical risk.                 5. Disposition patient to return to clinic to see MD/APP in 3 months or sooner if needed  Davis Vannatter, NP 01/21/2023, 10:47 AM

## 2023-01-21 ENCOUNTER — Encounter: Payer: Self-pay | Admitting: Cardiology

## 2023-01-21 ENCOUNTER — Ambulatory Visit: Payer: Medicare Other | Attending: Cardiology | Admitting: Cardiology

## 2023-01-21 VITALS — BP 145/81 | HR 77 | Ht 59.0 in | Wt 143.2 lb

## 2023-01-21 DIAGNOSIS — Z01818 Encounter for other preprocedural examination: Secondary | ICD-10-CM | POA: Diagnosis not present

## 2023-01-21 DIAGNOSIS — E782 Mixed hyperlipidemia: Secondary | ICD-10-CM | POA: Diagnosis not present

## 2023-01-21 DIAGNOSIS — I10 Essential (primary) hypertension: Secondary | ICD-10-CM | POA: Diagnosis not present

## 2023-01-21 DIAGNOSIS — R29898 Other symptoms and signs involving the musculoskeletal system: Secondary | ICD-10-CM

## 2023-01-21 DIAGNOSIS — R0602 Shortness of breath: Secondary | ICD-10-CM

## 2023-01-21 DIAGNOSIS — R0609 Other forms of dyspnea: Secondary | ICD-10-CM

## 2023-01-21 DIAGNOSIS — R002 Palpitations: Secondary | ICD-10-CM

## 2023-01-21 DIAGNOSIS — R5383 Other fatigue: Secondary | ICD-10-CM | POA: Diagnosis not present

## 2023-01-21 MED ORDER — METOPROLOL SUCCINATE ER 50 MG PO TB24: 50.0000 mg | ORAL_TABLET | Freq: Two times a day (BID) | ORAL | 0 refills | Status: DC

## 2023-01-21 NOTE — Patient Instructions (Signed)
Medication Instructions:  Your physician has recommended you make the following change in your medication:   INCREASE Toprol XL to 50 mg twice a day  *If you need a refill on your cardiac medications before your next appointment, please call your pharmacy*   Lab Work: None  If you have labs (blood work) drawn today and your tests are completely normal, you will receive your results only by: [place] (if you have MyChart) OR A paper copy in the mail If you have any lab test that is abnormal or we need to change your treatment, we will call you to review the results.   Testing/Procedures: Your physician has requested that you have an echocardiogram. Echocardiography is a painless test that uses sound waves to create images of your heart. It provides your doctor with information about the size and shape of your heart and how well your heart's chambers and valves are working. This procedure takes approximately one hour. There are no restrictions for this procedure. Please do NOT wear cologne, perfume, aftershave, or lotions (deodorant is allowed). Please arrive 15 minutes prior to your appointment time.    Follow-Up: At [facility], you and your health needs are our priority.  As part of our continuing mission to provide you with exceptional heart care, we have created designated Provider Care Teams.  These Care Teams include your primary Cardiologist (physician) and Advanced Practice Providers (APPs -  Physician Assistants and Nurse Practitioners) who all work together to provide you with the care you need, when you need it.   Your next appointment:   3 month(s)  Provider:   [name], MD or Gerrie Nordmann, NP

## 2023-01-27 DIAGNOSIS — M25561 Pain in right knee: Secondary | ICD-10-CM | POA: Insufficient documentation

## 2023-01-27 DIAGNOSIS — M1711 Unilateral primary osteoarthritis, right knee: Secondary | ICD-10-CM | POA: Insufficient documentation

## 2023-01-27 DIAGNOSIS — M13861 Other specified arthritis, right knee: Secondary | ICD-10-CM | POA: Diagnosis not present

## 2023-02-03 NOTE — Progress Notes (Unsigned)
PROVIDER NOTE: Information contained herein reflects review and annotations entered in association with encounter. Interpretation of such information and data should be left to medically-trained personnel. Information provided to patient can be located elsewhere in the medical record under "Patient Instructions". Document created using STT-dictation technology, any transcriptional errors that may result from process are unintentional.    Patient: Maria Spencer  Service Category: E/M  Provider: Gaspar Cola, MD  DOB: 1945-12-02  DOS: 02/05/2023  Referring Provider: Carollee Leitz, MD  MRN: [id number]  Specialty: Interventional Pain Management  PCP: Carollee Leitz, MD  Type: Established Patient  Setting: Ambulatory outpatient    Location: Office  Delivery: Face-to-face     HPI  [name], a 77 y.o. year old female, is here today because of her No primary diagnosis found.. [name]'s primary complain today is No chief complaint on file.  Pertinent problems: 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; Arthritis; Chronic hip pain (2ry area of Pain) (Bilateral) (R>L); Chronic low back pain with sciatica; Chronic thoracic back pain (1ry area of Pain) (Bilateral) (R>L); Fusion of spine of lumbosacral region; Lumbosacral spondylosis with radiculopathy; Chronic pain syndrome; Chronic lower extremity pain (3ry area of Pain) (Right); Chronic knee pain (4th area of Pain) (Posterior) (Left); Thoracic facet syndrome (Bilateral); Chronic sacroiliac joint pain (Bilateral); Spondylosis without myelopathy or radiculopathy, thoracic region; Closed wedge compression fracture of T8 vertebra ([place]); Osteoarthritis of hips (Bilateral); Abnormal thoracolumbar CT myelogram (01/05/2020); Breast cancer ([place]); 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); DDD (degenerative disc disease), cervical; DDD (degenerative disc  disease), thoracic; DDD (degenerative disc disease), lumbar; Displacement of thoracic intervertebral disc (C6-T3, T4-5, T6-7, T8-9); Non-traumatic compression fracture of T8 thoracic vertebra, sequela; Atypical chest pain; Cervicalgia; Chronic upper extremity pain (Right); Cervical spondylosis with radiculopathy (Right); Cervical radiculopathy at C7 (Right); Chronic shoulder pain (Bilateral) (R>L); Chronic shoulder pain after shoulder replacement (Right); Osteoarthritis of AC (acromioclavicular) joints (Bilateral); Chronic Acromioclavicular (AC) joint pain (Bilateral); Osteoarthritis of glenohumeral joint (Left); Tear of right gluteus minimus tendon; Abnormal MRI, cervical spine (09/26/2020); Cervical facet syndrome (Bilateral); Cervicogenic headache (Bilateral); Occipital neuralgia (greater occipital nerve) (Bilateral); Cervico-occipital neuralgia (Bilateral); Cervical facet hypertrophy (Multilevel) (Bilateral); Spondylosis without myelopathy or radiculopathy, cervical region; Scoliosis of thoracic spine, unspecified scoliosis type; 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 Pain Assessment: Severity of   is reported as a  /10. Location:    / . Onset:  . Quality:  . Timing:  . Modifying factor(s):  Marland Kitchen Vitals:  vitals were not taken for this visit.  BMI: Estimated body mass index is 28.92 kg/m as calculated from the following:   Height as of 01/21/23: '4\' 11"'$  (1.499 m).   Weight as of 01/21/23: 143 lb 3.2 oz (65 kg). Last encounter: Visit date not found. Last procedure: Visit date not found.  Reason for encounter:  *** . ***  Pharmacotherapy Assessment  Analgesic: No opioid analgesics prescribed by our practice. Highest recorded MME/day: 75 mg/day MME/day:  0 mg/day   Monitoring: [place] PMP: PDMP reviewed during this encounter.       Pharmacotherapy: No side-effects or adverse reactions reported. Compliance: No problems identified. Effectiveness: Clinically acceptable.  No notes on file  No results  found for: "CBDTHCR" No results found for: "D8THCCBX" No results found for: "D9THCCBX"  UDS:  No results found for: "SUMMARY"    ROS  Constitutional: Denies any fever or chills Gastrointestinal: No reported hemesis, hematochezia, vomiting, or acute GI distress Musculoskeletal: Denies any acute onset joint swelling, redness, loss of ROM, or weakness Neurological: No reported episodes of acute onset apraxia, aphasia, dysarthria, agnosia, amnesia, paralysis, loss of coordination, or loss of consciousness  Medication Review  Calcium Carb-Cholecalciferol, Cholecalciferol, D-Mannose, Multi-Vitamins, atorvastatin, cyanocobalamin, levothyroxine, metoprolol succinate, and telmisartan  History Review  Allergy: [name] is allergic to sulfa antibiotics, nitrofuran derivatives, and amlodipine besylate. Drug: [name]  reports no history of drug use. Alcohol:  reports that she does not currently use alcohol. Tobacco:  reports that she has never smoked. She has never been exposed to tobacco smoke. She has never used smokeless tobacco. Social: [name]  reports that she has never smoked. She has never been exposed to tobacco smoke. She has never used smokeless tobacco. She reports that she does not currently use alcohol. She reports that she does not use drugs. Medical:  has a past medical history of Arthritis, Breast cancer ([place]) (2009), Chicken pox, Chronic UTI, COVID-19, Diverticular disease, Esophagitis, History of kidney stones, Hormone disorder, Hyperlipidemia, Hypertension, Hypothyroidism, Osteoporosis, Personal history of radiation therapy (2009), and Thyroid disease. Surgical: [name]  has a past surgical history that includes Total shoulder  replacement (Right, 2012); Shoulder surgery (Left, 2008, 2009); Tonsillectomy; Bone graft hip iliac crest (2018); Spine surgery (10/23/2017); Ventral hernia repair (N/A, 05/29/2018); Insertion of mesh (N/A, 05/29/2018); Cardiac catheterization (2001); Breast lumpectomy (Right, 2009); Breast biopsy (Left, 2009); Breast biopsy (Right, 2009); Colonoscopy with propofol (N/A, 05/15/2020); Spinal fusion (07/24/2020); Cataract extraction, bilateral (Bilateral, 10/2019); Tubal ligation; Quadriceps tendon repair (Right, 12/05/2020); right gluteus medius/minimus repair Dr. Roland Rack 11/2020 ; and Total knee arthroplasty (Left, 03/18/2022). Family: family history includes AAA (abdominal aortic aneurysm) in her father; Arthritis in her father, mother, sister, and sister; Cancer in her brother and father; Coronary artery disease (age of onset: 5) in her mother; Diabetes in her father; Drug abuse in her daughter; Heart disease (age of onset: 30) in her mother; Hyperlipidemia in her mother, sister, and sister; Hypertension in her mother and sister; Mental retardation in her brother.  Laboratory Chemistry Profile   Renal Lab Results  Component Value Date   BUN 12 03/19/2022   CREATININE 0.49 03/19/2022   BCR 28 (H) 09/10/2019   GFR 90.60 02/12/2022   GFRAA >60 05/08/2020   GFRNONAA >60 03/19/2022    Hepatic Lab Results  Component Value Date   AST 19 03/11/2022   ALT 15 03/11/2022   ALBUMIN 3.9 03/11/2022   ALKPHOS 72 03/11/2022   LIPASE 26 03/15/2021    Electrolytes Lab Results  Component Value Date   NA 136 03/19/2022   K 3.9 03/19/2022   CL 104 03/19/2022   CALCIUM 8.4 (L) 03/19/2022   MG 2.0 05/08/2020    Bone Lab Results  Component Value Date   VD25OH 92.18 10/08/2021    Inflammation (CRP: Acute Phase) (ESR: Chronic Phase) Lab Results  Component Value Date   CRP 0.6 05/08/2020   ESRSEDRATE 5 05/08/2020   LATICACIDVEN 0.7 03/15/2021         Note: Above Lab results reviewed.  Recent  Imaging Review  MR CERVICAL SPINE WO CONTRAST CLINICAL DATA:  Chronic neck and bilateral shoulder pain. History of thoracolumbar fusion.  EXAM: MRI CERVICAL SPINE WITHOUT CONTRAST  TECHNIQUE:  Multiplanar, multisequence MR imaging of the cervical spine was performed. No intravenous contrast was administered.  COMPARISON:  MRI cervical spine dated September 26, 2020.  FINDINGS: Alignment: Unchanged straightening of the normal cervical lordosis and trace anterolisthesis at C7-T1.  Vertebrae: No fracture, evidence of discitis, or bone lesion. Progressive degenerative endplate marrow edema from C4-C7. Posterior thoracic spine fusion hardware beginning at T3.  Cord: Normal signal and morphology.  Posterior Fossa, vertebral arteries, paraspinal tissues: Negative.  Disc levels:  C2-C3: New small shallow left paracentral disc protrusion. Progressive advanced left facet arthropathy with mild perifacet degenerative marrow edema. New mild left neuroforaminal stenosis. No spinal canal or right neuroforaminal stenosis.  C3-C4: Unchanged mild disc bulging with moderate left and mild right facet uncovertebral hypertrophy. Unchanged moderate left and mild right neuroforaminal stenosis. No spinal canal stenosis.  C4-C5: Unchanged bulky circumferential disc osteophyte complex and advanced bilateral uncovertebral hypertrophy. Progressive severe right facet arthropathy with new prominent perifacet degenerative marrow edema. Unchanged mild spinal canal stenosis. Unchanged moderate to severe left and progressive moderate to severe right neuroforaminal stenosis.  C5-C6: Unchanged small posterior disc osteophyte complex and advanced bilateral uncovertebral hypertrophy. Unchanged mild spinal canal and moderate bilateral neuroforaminal stenosis.  C6-C7: Unchanged small circumferential disc osteophyte complex and mild bilateral facet uncovertebral hypertrophy. Unchanged mild spinal canal  stenosis. Unchanged moderate left neuroforaminal stenosis. No right neuroforaminal stenosis.  C7-T1: Unchanged small circumferential disc osteophyte complex and mild bilateral facet arthropathy. Unchanged mild left-greater-than-right neuroforaminal stenosis. No spinal canal stenosis.  T1-T2: Unchanged circumferential disc osteophyte complex and mild bilateral facet arthropathy. Unchanged mild right neuroforaminal stenosis. No spinal canal or left neuroforaminal stenosis.  T2-T3: Unchanged mild disc bulging and bilateral facet arthropathy. No stenosis.  T3-T4 and T4-T5: Prior posterior fusion.  No residual stenosis.  IMPRESSION: 1. Progressive advanced facet arthropathy on the left at C2-C3 and on the right at C4-C5 with degenerative perifacet marrow edema, which can be a source of pain. 2. Multilevel cervical spondylosis as described above, progressed at C2-C3 and C4-C5. Findings remain worst at C4-C5 where there is unchanged moderate to severe left and progressive moderate to severe right neuroforaminal stenosis.  Electronically Signed   By: Titus Dubin M.D.   On: 08/06/2022 12:02 Note: Reviewed        Physical Exam  General appearance: Well nourished, well developed, and well hydrated. In no apparent acute distress Mental status: Alert, oriented x 3 (person, place, & time)       Respiratory: No evidence of acute respiratory distress Eyes: PERLA Vitals: There were no vitals taken for this visit. BMI: Estimated body mass index is 28.92 kg/m as calculated from the following:   Height as of 01/21/23: '4\' 11"'$  (1.499 m).   Weight as of 01/21/23: 143 lb 3.2 oz (65 kg). Ideal: Patient must be at least 60 in tall to calculate ideal body weight  Assessment   Diagnosis Status  No diagnosis found. Controlled Controlled Controlled   Updated Problems: No problems updated.  Plan of Care  Problem-specific:  No problem-specific Assessment & Plan notes found for this  encounter.  Ms. Jovee Earney has a current medication list which includes the following long-term medication(s): atorvastatin, levothyroxine, metoprolol succinate, and telmisartan.  Pharmacotherapy (Medications Ordered): No orders of the defined types were placed in this encounter.  Orders:  No orders of the defined types were placed in this encounter.  Follow-up plan:   No follow-ups on file.      Interventional Therapies  Risk  Complexity Considerations:  Estimated body mass index is 25.85 kg/m as calculated from the following:   Height as of this encounter: '4\' 11"'$  (1.499 m).   Weight as of this encounter: 128 lb (58.1 kg). WNL   Planned  Pending:      Under consideration:   Diagnostic left inferolateral and recurrent genicular NB #1  Therapeutic left inferolateral and recurrent genicular nerve RFA #1  Diagnostic right T9-10 thoracic ESI #1  Diagnostic bilateral T9, T10, T11, and T12 MMB thoracic facet RFA (DIFICULT CANDIDATE DUE TO HARDWARE)    Completed:   Diagnostic left genicular NB x1 (07/03/2021) (100/100/50/50)  Therapeutic left genicular nerve RFA x1 (08/09/2021) (100/100/30/0)  Diagnostic bilateral cervical facet MBB x1 (10/26/2020) (100/100/0)  Diagnostic bilateral suprascapular NB x1 (10/05/2020) (100/100/100 x1 day/0)  Diagnostic right cervical ESI x1 (09/14/2020) (100/100/100/90-100)  Diagnostic/therapeutic bilateral T9-T12 thoracic facet MBB x2 (06/06/2020) (100/100/0)  Diagnostic/Therapeutic right trapezoid bursa injection x1 (11/09/2020) (did not follow-up)    Therapeutic  Palliative (PRN) options:   Therapeutic/palliative bilateral T9-12 thoracic facet MBB #3  Therapeutic/palliative right trapezoid bursa injection #2        Recent Visits No visits were found meeting these conditions. Showing recent visits within past 90 days and meeting all other requirements Future Appointments Date Type Provider Dept  02/05/23 Appointment Milinda Pointer,  MD [facility]  Showing future appointments within next 90 days and meeting all other requirements  I discussed the assessment and treatment plan with the patient. The patient was provided an opportunity to ask questions and all were answered. The patient agreed with the plan and demonstrated an understanding of the instructions.  Patient advised to call back or seek an in-person evaluation if the symptoms or condition worsens.  Duration of encounter: *** minutes.  Total time on encounter, as per AMA guidelines included both the face-to-face and non-face-to-face time personally spent by the physician and/or other qualified health care professional(s) on the day of the encounter (includes time in activities that require the physician or other qualified health care professional and does not include time in activities normally performed by clinical staff). Physician's time may include the following activities when performed: Preparing to see the patient (e.g., pre-charting review of records, searching for previously ordered imaging, lab work, and nerve conduction tests) Review of prior analgesic pharmacotherapies. Reviewing PMP Interpreting ordered tests (e.g., lab work, imaging, nerve conduction tests) Performing post-procedure evaluations, including interpretation of diagnostic procedures Obtaining and/or reviewing separately obtained history Performing a medically appropriate examination and/or evaluation Counseling and educating the patient/family/caregiver Ordering medications, tests, or procedures Referring and communicating with other health care professionals (when not separately reported) Documenting clinical information in the electronic or other health record Independently interpreting results (not separately reported) and communicating results to the patient/ family/caregiver Care coordination (not separately reported)  Note by: Gaspar Cola, MD Date: 02/05/2023;  Time: 10:18 AM

## 2023-02-05 ENCOUNTER — Ambulatory Visit: Payer: Medicare Other | Attending: Pain Medicine | Admitting: Pain Medicine

## 2023-02-05 ENCOUNTER — Encounter: Payer: Self-pay | Admitting: Pain Medicine

## 2023-02-05 VITALS — BP 183/79 | HR 75 | Temp 98.3°F | Ht 59.0 in | Wt 141.0 lb

## 2023-02-05 DIAGNOSIS — G8929 Other chronic pain: Secondary | ICD-10-CM | POA: Diagnosis not present

## 2023-02-05 DIAGNOSIS — M4152 Other secondary scoliosis, cervical region: Secondary | ICD-10-CM | POA: Insufficient documentation

## 2023-02-05 DIAGNOSIS — M5030 Other cervical disc degeneration, unspecified cervical region: Secondary | ICD-10-CM | POA: Diagnosis not present

## 2023-02-05 DIAGNOSIS — M25519 Pain in unspecified shoulder: Secondary | ICD-10-CM | POA: Diagnosis not present

## 2023-02-05 DIAGNOSIS — M25511 Pain in right shoulder: Secondary | ICD-10-CM | POA: Diagnosis not present

## 2023-02-05 DIAGNOSIS — M5481 Occipital neuralgia: Secondary | ICD-10-CM | POA: Diagnosis not present

## 2023-02-05 DIAGNOSIS — M542 Cervicalgia: Secondary | ICD-10-CM | POA: Diagnosis not present

## 2023-02-05 DIAGNOSIS — R937 Abnormal findings on diagnostic imaging of other parts of musculoskeletal system: Secondary | ICD-10-CM | POA: Diagnosis not present

## 2023-02-05 DIAGNOSIS — M25512 Pain in left shoulder: Secondary | ICD-10-CM | POA: Diagnosis not present

## 2023-02-05 DIAGNOSIS — G4486 Cervicogenic headache: Secondary | ICD-10-CM | POA: Insufficient documentation

## 2023-02-05 DIAGNOSIS — M79601 Pain in right arm: Secondary | ICD-10-CM | POA: Diagnosis not present

## 2023-02-05 DIAGNOSIS — M47812 Spondylosis without myelopathy or radiculopathy, cervical region: Secondary | ICD-10-CM | POA: Diagnosis not present

## 2023-02-05 DIAGNOSIS — M4722 Other spondylosis with radiculopathy, cervical region: Secondary | ICD-10-CM | POA: Diagnosis not present

## 2023-02-05 DIAGNOSIS — Z96619 Presence of unspecified artificial shoulder joint: Secondary | ICD-10-CM | POA: Diagnosis not present

## 2023-02-05 NOTE — Progress Notes (Signed)
Safety precautions to be maintained throughout the outpatient stay will include: orient to surroundings, keep bed in low position, maintain call bell within reach at all times, provide assistance with transfer out of bed and ambulation.  

## 2023-02-05 NOTE — Patient Instructions (Addendum)
Facet Blocks Patient Information  Description: The facets are joints in the spine between the vertebrae.  Like any joints in the body, facets can become irritated and painful.  Arthritis can also effect the facets.  By injecting steroids and local anesthetic in and around these joints, we can temporarily block the nerve supply to them.  Steroids act directly on irritated nerves and tissues to reduce selling and inflammation which often leads to decreased pain.  Facet blocks may be done anywhere along the spine from the neck to the low back depending upon the location of your pain.   After numbing the skin with local anesthetic (like Novocaine), a small needle is passed onto the facet joints under x-ray guidance.  You may experience a sensation of pressure while this is being done.  The entire block usually lasts about 15-25 minutes.   Conditions which may be treated by facet blocks:  Low back/buttock pain Neck/shoulder pain Certain types of headaches  Preparation for the injection:  Do not eat any solid food or dairy products within 8 hours of your appointment. You may drink clear liquid up to 3 hours before appointment.  Clear liquids include water, black coffee, juice or soda.  No milk or cream please. You may take your regular medication, including pain medications, with a sip of water before your appointment.  Diabetics should hold regular insulin (if taken separately) and take 1/2 normal NPH dose the morning of the procedure.  Carry some sugar containing items with you to your appointment. A driver must accompany you and be prepared to drive you home after your procedure. Bring all your current medications with you. An IV may be inserted and sedation may be given at the discretion of the physician. A blood pressure cuff, EKG and other monitors will often be applied during the procedure.  Some patients may need to have extra oxygen administered for a short period. You will be asked to  provide medical information, including your allergies and medications, prior to the procedure.  We must know immediately if you are taking blood thinners (like Coumadin/Warfarin) or if you are allergic to IV iodine contrast (dye).  We must know if you could possible be pregnant.  Possible side-effects:  Bleeding from needle site Infection (rare, may require surgery) Nerve injury (rare) Numbness & tingling (temporary) Difficulty urinating (rare, temporary) Spinal headache (a headache worse with upright posture) Light-headedness (temporary) Pain at injection site (serveral days) Decreased blood pressure (rare, temporary) Weakness in arm/leg (temporary) Pressure sensation in back/neck (temporary)   Call if you experience:  Fever/chills associated with headache or increased back/neck pain Headache worsened by an upright position New onset, weakness or numbness of an extremity below the injection site Hives or difficulty breathing (go to the emergency room) Inflammation or drainage at the injection site(s) Severe back/neck pain greater than usual New symptoms which are concerning to you  Please note:  Although the local anesthetic injected can often make your back or neck feel good for several hours after the injection, the pain will likely return. It takes 3-7 days for steroids to work.  You may not notice any pain relief for at least one week.  If effective, we will often do a series of 2-3 injections spaced 3-6 weeks apart to maximally decrease your pain.  After the initial series, you may be a candidate for a more permanent nerve block of the facets.  If you have any questions, please call #[phone]) [facility]  Pain Clinic ______________________________________________________________________  Procedure instructions  Do not eat or drink fluids (other than water) for 6 hours before your procedure  No water for 2 hours before your procedure  Take your  blood pressure medicine with a sip of water  Arrive 30 minutes before your appointment  Carefully read the "Preparing for your procedure" detailed instructions  If you have questions call us at [phone]  _____________________________________________________________________    ______________________________________________________________________  Preparing for your procedure  Appointments: If you think you may not be able to keep your appointment, call 24-48 hours in advance to cancel. We need time to make it available to others.  During your procedure appointment there will be: No Prescription Refills. No disability issues to discussed. No medication changes or discussions.  Instructions: Food intake: Avoid eating anything solid for at least 8 hours prior to your procedure. Clear liquid intake: You may take clear liquids such as water up to 2 hours prior to your procedure. (No carbonated drinks. No soda.) Transportation: Unless otherwise stated by your physician, bring a driver. Morning Medicines: Except for blood thinners, take all of your other morning medications with a sip of water. Make sure to take your heart and blood pressure medicines. If your blood pressure's lower number is above 100, the case will be rescheduled. Blood thinners: Make sure to stop your blood thinners as instructed.  If you take a blood thinner, but were not instructed to stop it, call our office [phone] and ask to talk to a nurse. Not stopping a blood thinner prior to certain procedures could lead to serious complications. Diabetics on insulin: Notify the staff so that you can be scheduled 1st case in the morning. If your diabetes requires high dose insulin, take only  of your normal insulin dose the morning of the procedure and notify the staff that you have done so. Preventing infections: Shower with an antibacterial soap the morning of your procedure.  Build-up your immune system: Take 1000  mg of Vitamin C with every meal (3 times a day) the day prior to your procedure. Antibiotics: Inform the nursing staff if you are taking any antibiotics or if you have any conditions that may require antibiotics prior to procedures. (Example: recent joint implants)   Pregnancy: If you are pregnant make sure to notify the nursing staff. Not doing so may result in injury to the fetus, including death.  Sickness: If you have a cold, fever, or any active infections, call and cancel or reschedule your procedure. Receiving steroids while having an infection may result in complications. Arrival: You must be in the facility at least 30 minutes prior to your scheduled procedure. Tardiness: Your scheduled time is also the cutoff time. If you do not arrive at least 15 minutes prior to your procedure, you will be rescheduled.  Children: Do not bring any children with you. Make arrangements to keep them home. Dress appropriately: There is always a possibility that your clothing may get soiled. Avoid long dresses. Valuables: Do not bring any jewelry or valuables.  Reasons to call and reschedule or cancel your procedure: (Following these recommendations will minimize the risk of a serious complication.) Surgeries: Avoid having procedures within 2 weeks of any surgery. (Avoid for 2 weeks before or after any surgery). Flu Shots: Avoid having procedures within 2 weeks of a flu shots or . (Avoid for 2 weeks before or after immunizations). Barium: Avoid having a procedure within 7-10 days after having had a radiological study involving  the use of radiological contrast. (Myelograms, Barium swallow or enema study). Heart attacks: Avoid any elective procedures or surgeries for the initial 6 months after a "Myocardial Infarction" (Heart Attack). Blood thinners: It is imperative that you stop these medications before procedures. Let us know if you if you take any blood thinner.  Infection: Avoid procedures during or within  two weeks of an infection (including chest colds or gastrointestinal problems). Symptoms associated with infections include: Localized redness, fever, chills, night sweats or profuse sweating, burning sensation when voiding, cough, congestion, stuffiness, runny nose, sore throat, diarrhea, nausea, vomiting, cold or Flu symptoms, recent or current infections. It is specially important if the infection is over the area that we intend to treat. Heart and lung problems: Symptoms that may suggest an active cardiopulmonary problem include: cough, chest pain, breathing difficulties or shortness of breath, dizziness, ankle swelling, uncontrolled high or unusually low blood pressure, and/or palpitations. If you are experiencing any of these symptoms, cancel your procedure and contact your primary care physician for an evaluation.  Remember:  Regular Business hours are:  Monday to Thursday 8:00 AM to 4:00 PM  Provider's Schedule: Milinda Pointer, MD:  Procedure days: Tuesday and Thursday 7:30 AM to 4:00 PM  Gillis Santa, MD:  Procedure days: Monday and Wednesday 7:30 AM to 4:00 PM  ______________________________________________________________________    ____________________________________________________________________________________________  General Risks and Possible Complications  Patient Responsibilities: It is important that you read this as it is part of your informed consent. It is our duty to inform you of the risks and possible complications associated with treatments offered to you. It is your responsibility as a patient to read this and to ask questions about anything that is not clear or that you believe was not covered in this document.  Patient's Rights: You have the right to refuse treatment. You also have the right to change your mind, even after initially having agreed to have the treatment done. However, under this last option, if you wait until the last second to change your  mind, you may be charged for the materials used up to that point.  Introduction: Medicine is not an Chief Strategy Officer. Everything in Medicine, including the lack of treatment(s), carries the potential for danger, harm, or loss (which is by definition: Risk). In Medicine, a complication is a secondary problem, condition, or disease that can aggravate an already existing one. All treatments carry the risk of possible complications. The fact that a side effects or complications occurs, does not imply that the treatment was conducted incorrectly. It must be clearly understood that these can happen even when everything is done following the highest safety standards.  No treatment: You can choose not to proceed with the proposed treatment alternative. The "PRO(s)" would include: avoiding the risk of complications associated with the therapy. The "CON(s)" would include: not getting any of the treatment benefits. These benefits fall under one of three categories: diagnostic; therapeutic; and/or palliative. Diagnostic benefits include: getting information which can ultimately lead to improvement of the disease or symptom(s). Therapeutic benefits are those associated with the successful treatment of the disease. Finally, palliative benefits are those related to the decrease of the primary symptoms, without necessarily curing the condition (example: decreasing the pain from a flare-up of a chronic condition, such as incurable terminal cancer).  General Risks and Complications: These are associated to most interventional treatments. They can occur alone, or in combination. They fall under one of the following six (6) categories: no benefit or worsening  of symptoms; bleeding; infection; nerve damage; allergic reactions; and/or death. No benefits or worsening of symptoms: In Medicine there are no guarantees, only probabilities. No healthcare provider can ever guarantee that a medical treatment will work, they can only state  the probability that it may. Furthermore, there is always the possibility that the condition may worsen, either directly, or indirectly, as a consequence of the treatment. Bleeding: This is more common if the patient is taking a blood thinner, either prescription or over the counter (example: Goody Powders, Fish oil, Aspirin, Garlic, etc.), or if suffering a condition associated with impaired coagulation (example: Hemophilia, cirrhosis of the liver, low platelet counts, etc.). However, even if you do not have one on these, it can still happen. If you have any of these conditions, or take one of these drugs, make sure to notify your treating physician. Infection: This is more common in patients with a compromised immune system, either due to disease (example: diabetes, cancer, human immunodeficiency virus [HIV], etc.), or due to medications or treatments (example: therapies used to treat cancer and rheumatological diseases). However, even if you do not have one on these, it can still happen. If you have any of these conditions, or take one of these drugs, make sure to notify your treating physician. Nerve Damage: This is more common when the treatment is an invasive one, but it can also happen with the use of medications, such as those used in the treatment of cancer. The damage can occur to small secondary nerves, or to large primary ones, such as those in the spinal cord and brain. This damage may be temporary or permanent and it may lead to impairments that can range from temporary numbness to permanent paralysis and/or brain death. Allergic Reactions: Any time a substance or material comes in contact with our body, there is the possibility of an allergic reaction. These can range from a mild skin rash (contact dermatitis) to a severe systemic reaction (anaphylactic reaction), which can result in death. Death: In general, any medical intervention can result in death, most of the time due to an unforeseen  complication. ____________________________________________________________________________________________

## 2023-02-07 NOTE — H&P (View-Only) (Signed)
Referring Physician:  Loleta Dicker, [facility] [address] [place],  [place] [address]  Primary Physician:  Carollee Leitz, MD  History of Present Illness: 02/11/2023 [name] is here today with a chief complaint of back and leg pain.  She has had chronic right lower extremity pain.  She has right knee and right hip issues that are being evaluated by an outside orthopedic surgeon.  She is here today with pain around her sacroiliac screws.  She would like to consider removal of the screws. Bowel/Bladder Dysfunction: none  Conservative measures:  Physical therapy:   Physical therapy for her lumbar spine at [facility] without any significant relief. Multimodal medical therapy including regular antiinflammatories:  ibuprofen, lyrica,Voltaren, tramadol  Injections:  has not received epidural steroid injections?  Past Surgery:  Prior thoracolumbar fusion    Saly Hoskin has no symptoms of cervical myelopathy.  The symptoms are causing a significant impact on the patient's life.   I have utilized the care everywhere function in epic to review the outside records available from external health systems.   Seen Maria Spencer. PA on 01/16/2023  History of Present Illness: 01/16/2023 [name] is a 77 year old with a past medical history of osteoporosis, recurrent UTIs, breast cancer status post right lumpectomy with radiation in 2009, for lipidemia, hypertension, hypothyroidism, who is here today with a chief complaint of neck and lumbar complaints. In regards to her lumbar spine, she states that she did well with her last surgery until about 6 months ago when she began to have pain particularly in the left side of her low back.  She states she also has weakness and numbness and tingling in her anterior thighs.  Her primary concern is her left-sided low back pain and this effect on her ambulation.  She states that she underwent physical therapy at  [facility] which worsened her back pain.  She states she did have a SI joint injection which did not provide any significant relief. In regards to her cervical spine, she states she has had neck pain for about 5 years with progressive worsening.  She describes pain in her neck that starts at the base of her skull and radiates into her scalp bilaterally.  She states that it feels like nerve pain.  She also describes pain between her shoulder blades particularly on the left-hand side without radiating pain, numbness, or tingling down her arms.  She recalls getting an injection for this many years ago but does not recall her response.  She has not had any recent physical therapy for her neck.       Review of Systems:  A 10 point review of systems is negative, except for the pertinent positives and negatives detailed in the HPI.  Past Medical History: Past Medical History:  Diagnosis Date   Arthritis    neck, back, left knee;    Breast cancer ([place]) 2009   right lumpectomy s/p radiation x 1 week 2x per day only    Chicken pox    Chronic UTI    established with urology    COVID-19    ? 12/2020 sob  took antiviral, 08/15/21   Diverticular disease    -osis and -itis    Esophagitis    egd 05/17/14 see report scanned into chart   History of kidney stones    Hormone disorder    Hyperlipidemia    Hypertension    controlled well with medication;    Hypothyroidism  Osteoporosis    Personal history of radiation therapy 2009   F/U right breast cancer   Thyroid disease    hypothyroidism     Past Surgical History: Past Surgical History:  Procedure Laterality Date   BONE GRAFT HIP ILIAC CREST  2018   + cage left hip 10/23/17    BREAST BIOPSY Left 2009   clip,benign   BREAST BIOPSY Right 2009   +   BREAST LUMPECTOMY Right 2009   2009 lumpectomy    CARDIAC CATHETERIZATION  2001   No stents;  results were WNL Dr. requested it for HTN   CATARACT EXTRACTION, BILATERAL Bilateral 10/2019    COLONOSCOPY WITH PROPOFOL N/A 05/15/2020   Procedure: COLONOSCOPY WITH PROPOFOL;  Surgeon: Lin Landsman, MD;  Location: [facility] ENDOSCOPY;  Service: Gastroenterology;  Laterality: N/A;   INSERTION OF MESH N/A 05/29/2018   Procedure: INSERTION OF MESH;  Surgeon: Johnathan Hausen, MD;  Location: [facility] ORS;  Service: General;  Laterality: N/A;   QUADRICEPS TENDON REPAIR Right 12/05/2020   Procedure: OPEN REPAIR OF RIGHT GLUTEUS MINIMUS TENDON;  Surgeon: Corky Mull, MD;  Location: [facility] ORS;  Service: Orthopedics;  Laterality: Right;   right gluteus medius/minimus repair Dr. Roland Rack 11/2020      Dr. Roland Rack   SHOULDER SURGERY Left 2008, 2009   x2 rotator cuff surgeries left shoulders   SPINAL FUSION  07/24/2020   SPINE SURGERY  10/23/2017   spinal 09/2017 h/o scoliosis [facility] L4-S1 OLIF T10 to ililum fusion   TONSILLECTOMY     age 55 y.o.    TOTAL KNEE ARTHROPLASTY Left 03/18/2022   Procedure: TOTAL KNEE ARTHROPLASTY;  Surgeon: Willaim Sheng, MD;  Location: [facility] ORS;  Service: Orthopedics;  Laterality: Left;   TOTAL SHOULDER REPLACEMENT Right 2012   TUBAL LIGATION     age 47   VENTRAL HERNIA REPAIR N/A 05/29/2018   Procedure: LAPAROSCOPIC VENTRAL / LATERAL HERNIA REPAIR;  Surgeon: Johnathan Hausen, MD;  Location: [facility] ORS;  Service: General;  Laterality: N/A;    Allergies: Allergies as of 02/11/2023 - Review Complete 02/11/2023  Allergen Reaction Noted   Sulfa antibiotics Anaphylaxis, Hives, Itching, Shortness Of Breath, and Swelling 06/13/2015   Nitrofuran derivatives Swelling 01/05/2020   Amlodipine besylate Other (See Comments) 09/10/2019    Medications: Current Meds  Medication Sig   atorvastatin (LIPITOR) 20 MG tablet Take 1 tablet (20 mg total) by mouth daily.   Calcium Carb-Cholecalciferol ([employer] CALCIUM 600 & VITAMIN D3 PO) Take 1 tablet by mouth daily.   cholecalciferol (VITAMIN D) 25 MCG (1000 UNIT) tablet Take 1,000 Units by mouth daily.   D-MANNOSE PO Take 1,300 mg by mouth  daily.   levothyroxine (SYNTHROID) 88 MCG tablet Take 1 tablet (88 mcg total) by mouth daily before breakfast. 30 minutes   metoprolol succinate (TOPROL-XL) 50 MG 24 hr tablet Take 1 tablet (50 mg total) by mouth 2 (two) times daily. Take with or immediately following a meal.   Multiple Vitamin (MULTI-VITAMINS) TABS Take 1 tablet by mouth daily.   telmisartan (MICARDIS) 80 MG tablet Take 1 tablet (80 mg total) by mouth daily.   traMADol (ULTRAM) 50 MG tablet Take 50 mg by mouth every 6 (six) hours as needed for severe pain.   vitamin B-12 (CYANOCOBALAMIN) 1000 MCG tablet Take 1,000 mcg by mouth daily.    Social History: Social History   Tobacco Use   Smoking status: Never    Passive exposure: Never   Smokeless tobacco: Never  Vaping Use  Vaping Use: Never used  Substance Use Topics   Alcohol use: Not Currently    Comment: rare   Drug use: No    Family Medical History: Family History  Problem Relation Age of Onset   Arthritis Mother    Heart disease Mother 62       stent    Hyperlipidemia Mother    Hypertension Mother    Coronary artery disease Mother 31   Arthritis Father    Diabetes Father    Cancer Father        colon   AAA (abdominal aortic aneurysm) Father    Arthritis Sister    Hyperlipidemia Sister    Hypertension Sister    Cancer Brother        lung, smoker   Mental retardation Brother    Drug abuse Daughter        overdose in 2018    Arthritis Sister    Hyperlipidemia Sister    Bladder Cancer Neg Hx    Kidney cancer Neg Hx    Breast cancer Neg Hx     Physical Examination: Vitals:   02/11/23 1102  BP: (!) 150/82  Pulse: 79  SpO2: 92%    General: Patient is well developed, well nourished, calm, collected, and in no apparent distress. Attention to examination is appropriate.  Neck:   Supple.  Full range of motion.  Respiratory: Patient is breathing without any difficulty.   NEUROLOGICAL:     Awake, alert, oriented to person, place, and time.   Speech is clear and fluent.   Cranial Nerves: Pupils equal round and reactive to light.  Facial tone is symmetric.  Facial sensation is symmetric. Shoulder shrug is symmetric. Tongue protrusion is midline.  There is no pronator drift.  ROM of spine: full.    Strength: Side Biceps Triceps Deltoid Interossei Grip Wrist Ext. Wrist Flex.  R 5 5 5 5 5 5 5   L 5 5 5 5 5 5 5    Side Iliopsoas Quads Hamstring PF DF EHL  R 5 5 5 5 5 5   L 5 5 5 5 5 5      Bilateral upper and lower extremity sensation is intact to light touch.    No evidence of dysmetria noted.  Gait is very abnormal and requires a cane.   She has pain around her SI joints bilaterally.  Medical Decision Making  Imaging: MRI cervical spine 08/06/2022 IMPRESSION: 1. Progressive advanced facet arthropathy on the left at C2-C3 and on the right at C4-C5 with degenerative perifacet marrow edema, which can be a source of pain. 2. Multilevel cervical spondylosis as described above, progressed at C2-C3 and C4-C5. Findings remain worst at C4-C5 where there is unchanged moderate to severe left and progressive moderate to severe right neuroforaminal stenosis.     Electronically Signed   By: Titus Dubin M.D.   On: 08/06/2022 12:02  I have personally reviewed the images and agree with the above interpretation.  Assessment and Plan: [name] is a pleasant 77 y.o. female with pain around her sacroiliac joints bilaterally.  I recommended removal of her S2 AI screws.  This will be an outpatient procedure.  She will need to have her orthopedic issues addressed at some point.  After that is done, we will also discuss potential for spinal cord stimulation to try to improve her right lower extremity pain.  I discussed the planned procedure at length with the patient, including the risks, benefits, alternatives, and indications. The risks  discussed include but are not limited to bleeding, infection, need for reoperation, spinal fluid  leak, stroke, vision loss, anesthetic complication, coma, paralysis, and even death. I also described in detail that improvement was not guaranteed.  The patient expressed understanding of these risks, and asked that we proceed with surgery. I described the surgery in layman's terms, and gave ample opportunity for questions, which were answered to the best of my ability.     Thank you for involving me in the care of this patient.      Tacey Dimaggio K. Izora Ribas MD, [facility] Neurosurgery

## 2023-02-07 NOTE — Progress Notes (Unsigned)
Referring Physician:  Loleta Dicker, [place] [address] [place],  [place] [address]  Primary Physician:  Maria Leitz, MD  History of Present Illness: 02/11/2023 [name] is here today with a chief complaint of back and leg pain.  She has had chronic right lower extremity pain.  She has right knee and right hip issues that are being evaluated by an outside orthopedic surgeon.  She is here today with pain around her sacroiliac screws.  She would like to consider removal of the screws. Bowel/Bladder Dysfunction: none  Conservative measures:  Physical therapy:   Physical therapy for her lumbar spine at [facility] without any significant relief. Multimodal medical therapy including regular antiinflammatories:  ibuprofen, lyrica,Voltaren, tramadol  Injections:  has not received epidural steroid injections?  Past Surgery:  Prior thoracolumbar fusion    Maria Spencer has no symptoms of cervical myelopathy.  The symptoms are causing a significant impact on the patient's life.   I have utilized the care everywhere function in epic to review the outside records available from external health systems.   Seen Maria Spencer. PA on 01/16/2023  History of Present Illness: 01/16/2023 [name] is a 77 year old with a past medical history of osteoporosis, recurrent UTIs, breast cancer status post right lumpectomy with radiation in 2009, for lipidemia, hypertension, hypothyroidism, who is here today with a chief complaint of neck and lumbar complaints. In regards to her lumbar spine, she states that she did well with her last surgery until about 6 months ago when she began to have pain particularly in the left side of her low back.  She states she also has weakness and numbness and tingling in her anterior thighs.  Her primary concern is her left-sided low back pain and this effect on her ambulation.  She states that she underwent physical therapy at  [facility] which worsened her back pain.  She states she did have a SI joint injection which did not provide any significant relief. In regards to her cervical spine, she states she has had neck pain for about 5 years with progressive worsening.  She describes pain in her neck that starts at the base of her skull and radiates into her scalp bilaterally.  She states that it feels like nerve pain.  She also describes pain between her shoulder blades particularly on the left-hand side without radiating pain, numbness, or tingling down her arms.  She recalls getting an injection for this many years ago but does not recall her response.  She has not had any recent physical therapy for her neck.       Review of Systems:  A 10 point review of systems is negative, except for the pertinent positives and negatives detailed in the HPI.  Past Medical History: Past Medical History:  Diagnosis Date   Arthritis    neck, back, left knee;    Breast cancer ([place]) 2009   right lumpectomy s/p radiation x 1 week 2x per day only    Chicken pox    Chronic UTI    established with urology    COVID-19    ? 12/2020 sob  took antiviral, 08/15/21   Diverticular disease    -osis and -itis    Esophagitis    egd 05/17/14 see report scanned into chart   History of kidney stones    Hormone disorder    Hyperlipidemia    Hypertension    controlled well with medication;    Hypothyroidism  Osteoporosis    Personal history of radiation therapy 2009   F/U right breast cancer   Thyroid disease    hypothyroidism     Past Surgical History: Past Surgical History:  Procedure Laterality Date   BONE GRAFT HIP ILIAC CREST  2018   + cage left hip 10/23/17    BREAST BIOPSY Left 2009   clip,benign   BREAST BIOPSY Right 2009   +   BREAST LUMPECTOMY Right 2009   2009 lumpectomy    CARDIAC CATHETERIZATION  2001   No stents;  results were WNL Dr. requested it for HTN   CATARACT EXTRACTION, BILATERAL Bilateral 10/2019    COLONOSCOPY WITH PROPOFOL N/A 05/15/2020   Procedure: COLONOSCOPY WITH PROPOFOL;  Surgeon: Lin Landsman, MD;  Location: [facility] ENDOSCOPY;  Service: Gastroenterology;  Laterality: N/A;   INSERTION OF MESH N/A 05/29/2018   Procedure: INSERTION OF MESH;  Surgeon: Johnathan Hausen, MD;  Location: [facility] ORS;  Service: General;  Laterality: N/A;   QUADRICEPS TENDON REPAIR Right 12/05/2020   Procedure: OPEN REPAIR OF RIGHT GLUTEUS MINIMUS TENDON;  Surgeon: Corky Mull, MD;  Location: [facility] ORS;  Service: Orthopedics;  Laterality: Right;   right gluteus medius/minimus repair Dr. Roland Rack 11/2020      Dr. Roland Rack   SHOULDER SURGERY Left 2008, 2009   x2 rotator cuff surgeries left shoulders   SPINAL FUSION  07/24/2020   SPINE SURGERY  10/23/2017   spinal 09/2017 h/o scoliosis [facility] L4-S1 OLIF T10 to ililum fusion   TONSILLECTOMY     age 55 y.o.    TOTAL KNEE ARTHROPLASTY Left 03/18/2022   Procedure: TOTAL KNEE ARTHROPLASTY;  Surgeon: Willaim Sheng, MD;  Location: [facility] ORS;  Service: Orthopedics;  Laterality: Left;   TOTAL SHOULDER REPLACEMENT Right 2012   TUBAL LIGATION     age 47   VENTRAL HERNIA REPAIR N/A 05/29/2018   Procedure: LAPAROSCOPIC VENTRAL / LATERAL HERNIA REPAIR;  Surgeon: Johnathan Hausen, MD;  Location: [facility] ORS;  Service: General;  Laterality: N/A;    Allergies: Allergies as of 02/11/2023 - Review Complete 02/11/2023  Allergen Reaction Noted   Sulfa antibiotics Anaphylaxis, Hives, Itching, Shortness Of Breath, and Swelling 06/13/2015   Nitrofuran derivatives Swelling 01/05/2020   Amlodipine besylate Other (See Comments) 09/10/2019    Medications: Current Meds  Medication Sig   atorvastatin (LIPITOR) 20 MG tablet Take 1 tablet (20 mg total) by mouth daily.   Calcium Carb-Cholecalciferol ([facility] CALCIUM 600 & VITAMIN D3 PO) Take 1 tablet by mouth daily.   cholecalciferol (VITAMIN D) 25 MCG (1000 UNIT) tablet Take 1,000 Units by mouth daily.   D-MANNOSE PO Take 1,300 mg by mouth  daily.   levothyroxine (SYNTHROID) 88 MCG tablet Take 1 tablet (88 mcg total) by mouth daily before breakfast. 30 minutes   metoprolol succinate (TOPROL-XL) 50 MG 24 hr tablet Take 1 tablet (50 mg total) by mouth 2 (two) times daily. Take with or immediately following a meal.   Multiple Vitamin (MULTI-VITAMINS) TABS Take 1 tablet by mouth daily.   telmisartan (MICARDIS) 80 MG tablet Take 1 tablet (80 mg total) by mouth daily.   traMADol (ULTRAM) 50 MG tablet Take 50 mg by mouth every 6 (six) hours as needed for severe pain.   vitamin B-12 (CYANOCOBALAMIN) 1000 MCG tablet Take 1,000 mcg by mouth daily.    Social History: Social History   Tobacco Use   Smoking status: Never    Passive exposure: Never   Smokeless tobacco: Never  Vaping Use  Vaping Use: Never used  Substance Use Topics   Alcohol use: Not Currently    Comment: rare   Drug use: No    Family Medical History: Family History  Problem Relation Age of Onset   Arthritis Mother    Heart disease Mother 62       stent    Hyperlipidemia Mother    Hypertension Mother    Coronary artery disease Mother 31   Arthritis Father    Diabetes Father    Cancer Father        colon   AAA (abdominal aortic aneurysm) Father    Arthritis Sister    Hyperlipidemia Sister    Hypertension Sister    Cancer Brother        lung, smoker   Mental retardation Brother    Drug abuse Daughter        overdose in 2018    Arthritis Sister    Hyperlipidemia Sister    Bladder Cancer Neg Hx    Kidney cancer Neg Hx    Breast cancer Neg Hx     Physical Examination: Vitals:   02/11/23 1102  BP: (!) 150/82  Pulse: 79  SpO2: 92%    General: Patient is well developed, well nourished, calm, collected, and in no apparent distress. Attention to examination is appropriate.  Neck:   Supple.  Full range of motion.  Respiratory: Patient is breathing without any difficulty.   NEUROLOGICAL:     Awake, alert, oriented to person, place, and time.   Speech is clear and fluent.   Cranial Nerves: Pupils equal round and reactive to light.  Facial tone is symmetric.  Facial sensation is symmetric. Shoulder shrug is symmetric. Tongue protrusion is midline.  There is no pronator drift.  ROM of spine: full.    Strength: Side Biceps Triceps Deltoid Interossei Grip Wrist Ext. Wrist Flex.  R 5 5 5 5 5 5 5   L 5 5 5 5 5 5 5    Side Iliopsoas Quads Hamstring PF DF EHL  R 5 5 5 5 5 5   L 5 5 5 5 5 5      Bilateral upper and lower extremity sensation is intact to light touch.    No evidence of dysmetria noted.  Gait is very abnormal and requires a cane.   She has pain around her SI joints bilaterally.  Medical Decision Making  Imaging: MRI cervical spine 08/06/2022 IMPRESSION: 1. Progressive advanced facet arthropathy on the left at C2-C3 and on the right at C4-C5 with degenerative perifacet marrow edema, which can be a source of pain. 2. Multilevel cervical spondylosis as described above, progressed at C2-C3 and C4-C5. Findings remain worst at C4-C5 where there is unchanged moderate to severe left and progressive moderate to severe right neuroforaminal stenosis.     Electronically Signed   By: Titus Dubin M.D.   On: 08/06/2022 12:02  I have personally reviewed the images and agree with the above interpretation.  Assessment and Plan: Ms. Koelsch is a pleasant 77 y.o. female with pain around her sacroiliac joints bilaterally.  I recommended removal of her S2 AI screws.  This will be an outpatient procedure.  She will need to have her orthopedic issues addressed at some point.  After that is done, we will also discuss potential for spinal cord stimulation to try to improve her right lower extremity pain.  I discussed the planned procedure at length with the patient, including the risks, benefits, alternatives, and indications. The risks  discussed include but are not limited to bleeding, infection, need for reoperation, spinal fluid  leak, stroke, vision loss, anesthetic complication, coma, paralysis, and even death. I also described in detail that improvement was not guaranteed.  The patient expressed understanding of these risks, and asked that we proceed with surgery. I described the surgery in layman's terms, and gave ample opportunity for questions, which were answered to the best of my ability.     Thank you for involving me in the care of this patient.      Denissa Cozart K. Izora Ribas MD, [facility] Neurosurgery

## 2023-02-10 DIAGNOSIS — M1711 Unilateral primary osteoarthritis, right knee: Secondary | ICD-10-CM | POA: Diagnosis not present

## 2023-02-10 DIAGNOSIS — M1611 Unilateral primary osteoarthritis, right hip: Secondary | ICD-10-CM | POA: Diagnosis not present

## 2023-02-11 ENCOUNTER — Ambulatory Visit (INDEPENDENT_AMBULATORY_CARE_PROVIDER_SITE_OTHER): Payer: Medicare Other | Admitting: Neurosurgery

## 2023-02-11 ENCOUNTER — Encounter: Payer: Self-pay | Admitting: Neurosurgery

## 2023-02-11 VITALS — BP 150/82 | HR 79 | Ht 59.0 in | Wt 141.0 lb

## 2023-02-11 DIAGNOSIS — Z981 Arthrodesis status: Secondary | ICD-10-CM | POA: Diagnosis not present

## 2023-02-11 DIAGNOSIS — T84498S Other mechanical complication of other internal orthopedic devices, implants and grafts, sequela: Secondary | ICD-10-CM | POA: Diagnosis not present

## 2023-02-11 NOTE — Patient Instructions (Addendum)
Please see below for information in regards to your upcoming surgery:   Planned surgery: Removal of iliac screws   Surgery date: 03/12/23 - you will find out your arrival time the business day before your surgery.   Pre-op appointment at [facility]: we will call you with a date/time for this. Pre-admit testing is located on the first floor of the [facility], [place], [address]. Please bring all prescriptions in the original prescription bottles to your appointment, even if you have reviewed medications by phone with a pharmacy representative. Pre-op labs may be done at your pre-op appointment. You are not required to fast for these labs. Should you need to change your pre-op appointment, please call Pre-admit testing at [phone].      We can be reached by phone or mychart 8am-4pm, Monday-Friday. If you have any questions/concerns before or after surgery, you can reach [place] at [phone], or you can send a mychart message. If you have a concern after hours that cannot wait until normal business hours, you can call [phone] and ask to page the neurosurgeon on call for [place].    Appointments/FMLA & disability paperwork: [place]  Nurse: Ophelia Shoulder  Medical assistants: Lum Keas Physician Assistant's: [place] Surgeon: Meade Maw, MD

## 2023-02-12 ENCOUNTER — Ambulatory Visit: Payer: Medicare Other | Admitting: Family Medicine

## 2023-02-13 DIAGNOSIS — M25561 Pain in right knee: Secondary | ICD-10-CM | POA: Diagnosis not present

## 2023-02-14 ENCOUNTER — Other Ambulatory Visit: Payer: Self-pay

## 2023-02-14 DIAGNOSIS — Z01818 Encounter for other preprocedural examination: Secondary | ICD-10-CM

## 2023-02-17 ENCOUNTER — Telehealth: Payer: Self-pay | Admitting: Gastroenterology

## 2023-02-17 DIAGNOSIS — K5733 Diverticulitis of large intestine without perforation or abscess with bleeding: Secondary | ICD-10-CM

## 2023-02-17 NOTE — Telephone Encounter (Signed)
Patient is having abdominal pain in LLQ pain and abdominal cramping. The pain gets worse when she has a bowel movements. Symptoms began on 02/12/2023. States she put her self on a liquid diet and symptoms got a little better. She states she ate rice yesterday and the pain returned. The pain is a dull pain all the time but get worse with bowel movement. Denies any nausea, vomiting, diarrhea, or fever.

## 2023-02-17 NOTE — Telephone Encounter (Signed)
Patient calling to schedule and appointment, she states she is having a flare up of her diverticulitis. Can you please advise were to schedule? Patient is established with Vicente Males.

## 2023-02-17 NOTE — Telephone Encounter (Signed)
Patient was seen for Diverticulitis on 05/21/2022. Called and left a message for call back

## 2023-02-18 ENCOUNTER — Ambulatory Visit: Payer: Medicare Other | Admitting: Pain Medicine

## 2023-02-18 ENCOUNTER — Ambulatory Visit: Payer: Medicare Other | Admitting: Family Medicine

## 2023-02-18 MED ORDER — METRONIDAZOLE 500 MG PO TABS: 500.0000 mg | ORAL_TABLET | Freq: Three times a day (TID) | ORAL | 0 refills | Status: AC

## 2023-02-18 MED ORDER — CIPROFLOXACIN HCL 500 MG PO TABS: 500.0000 mg | ORAL_TABLET | Freq: Two times a day (BID) | ORAL | 0 refills | Status: AC

## 2023-02-18 NOTE — Addendum Note (Signed)
Addended by: Ulyess Blossom L on: 02/18/2023 09:55 AM   Modules accepted: Orders

## 2023-02-18 NOTE — Telephone Encounter (Signed)
Sent medication to the pharmacy. Placed referral to general surgery

## 2023-02-18 NOTE — Telephone Encounter (Signed)
Patient is okay with a referral to general surgery but she might have to schedule the appointment in a month or so. She states that she would like a antibiotic for right now though. [facility] [place] but can not take anything with Sulfa.

## 2023-02-18 NOTE — Telephone Encounter (Signed)
Could be diverticulitis - if she wishes we can give her a course of antibiotics but as I have said before definitive treatment is surgery and strongly suggest her to see a surgeon

## 2023-02-19 ENCOUNTER — Telehealth: Payer: Self-pay | Admitting: Pain Medicine

## 2023-02-19 NOTE — Telephone Encounter (Signed)
Patient has diverticulitis and is on 2 antibiotics for 14 days, started yesterday. She is scheduled to have injections her on Tues 02-25-23. Can she still have the injections or does she need to reschedule. It will probably have to be the 1st of May because she is scheduled for surgery to have screws taken out on 03-12-23.. please advise patient

## 2023-02-19 NOTE — Telephone Encounter (Signed)
I would have her reschedule since she is on 2 antibiotics and it is a cervical procedure.

## 2023-02-20 ENCOUNTER — Telehealth: Payer: Self-pay

## 2023-02-20 NOTE — Telephone Encounter (Signed)
Per Dr Izora Ribas, as long as she is done with the antibiotics and feeling better, she can proceed with surgery on 4/17. I spoke with her about this. She will contact [facility] to reschedule her appt with them.

## 2023-02-20 NOTE — Telephone Encounter (Signed)
Dr Izora Ribas, what are your thoughts about the medications/diverticulitis? Surgery is scheduled for 03/12/23  Maria Spencer, she can contact [facility] to change her appt if she would like to

## 2023-02-20 NOTE — Telephone Encounter (Signed)
-----   Message from Peggyann Shoals sent at 02/20/2023  8:42 AM EDT ----- Regarding: preop question She was started on cipro and flagyl for diverticulitis. She will be finished with that by 03/02/2023 will this affect her surgery?  She has an echocardiogram scheduled for 4/10, she would like to have her preop after that appt. Would that be ok to try and have her preop maybe aday after?

## 2023-02-25 ENCOUNTER — Ambulatory Visit: Payer: Medicare Other | Admitting: Pain Medicine

## 2023-02-27 ENCOUNTER — Inpatient Hospital Stay: Admission: RE | Admit: 2023-02-27 | Payer: Medicare Other | Source: Ambulatory Visit

## 2023-03-05 ENCOUNTER — Other Ambulatory Visit: Payer: Self-pay

## 2023-03-05 ENCOUNTER — Telehealth: Payer: Self-pay | Admitting: Family Medicine

## 2023-03-05 ENCOUNTER — Ambulatory Visit: Payer: Medicare Other | Attending: Cardiology

## 2023-03-05 ENCOUNTER — Ambulatory Visit: Payer: Medicare Other | Admitting: Nurse Practitioner

## 2023-03-05 ENCOUNTER — Telehealth: Payer: Self-pay | Admitting: Gastroenterology

## 2023-03-05 DIAGNOSIS — R0609 Other forms of dyspnea: Secondary | ICD-10-CM

## 2023-03-05 DIAGNOSIS — K5792 Diverticulitis of intestine, part unspecified, without perforation or abscess without bleeding: Secondary | ICD-10-CM

## 2023-03-05 DIAGNOSIS — Z0279 Encounter for issue of other medical certificate: Secondary | ICD-10-CM

## 2023-03-05 LAB — ECHOCARDIOGRAM COMPLETE
AR max vel: 2.74 cm2
AV Area VTI: 2.71 cm2
AV Area mean vel: 2.71 cm2
AV Mean grad: 3 mmHg
AV Peak grad: 5 mmHg
Ao pk vel: 1.12 m/s
Area-P 1/2: 4.8 cm2
S' Lateral: 2.5 cm

## 2023-03-05 NOTE — Addendum Note (Signed)
Addended by: Charlyne Mom D on: 03/05/2023 03:45 PM   Modules accepted: Orders

## 2023-03-05 NOTE — Telephone Encounter (Signed)
I''m happy to see her in the office. Thanks  

## 2023-03-05 NOTE — Telephone Encounter (Signed)
I called the patient and informed her that I put in the correct DX for her referral and she understood.  Jaidence Geisler,cma

## 2023-03-05 NOTE — Telephone Encounter (Signed)
Patient is requesting a referral to [facility] in [facility] for 2nd opinion about having part of her colon removed.

## 2023-03-05 NOTE — Telephone Encounter (Signed)
Patient dropped off document Handicap Placard, to be filled out by provider. Patient requested to send it via Call Patient to pick up within 7-days. Document is located in providers tray at front office.Please advise at Mobile [phone] (mobile)

## 2023-03-05 NOTE — Telephone Encounter (Signed)
Hi Dr. Adela Lank,  Supervising Provider: 03/05/23-PM   We received a referral for patient to be evaluated for Diverticulitis, she has GI history with [facility] and is seeking a second opinion but would also like to remain a patient yours if possible. She said she was referred to a general surgeon but is not sure of the recommendation given. Records are available within Epic for yo to review and advise on scheduling.    Thank you

## 2023-03-05 NOTE — Telephone Encounter (Signed)
Pt called in staying that she has a referral for Gastro in [place], and they just called her saying that thy coded the referral wrong. Its not for diarrhea its for diverticulitis. Any questions or concern, she's available [phone]

## 2023-03-06 ENCOUNTER — Encounter
Admission: RE | Admit: 2023-03-06 | Discharge: 2023-03-06 | Disposition: A | Discharge status: Home / Self Care | Payer: Medicare Other | Source: Ambulatory Visit | Attending: Neurosurgery | Admitting: Neurosurgery

## 2023-03-06 VITALS — BP 153/70 | HR 73 | Resp 12 | Ht 59.0 in | Wt 138.0 lb

## 2023-03-06 DIAGNOSIS — N390 Urinary tract infection, site not specified: Secondary | ICD-10-CM

## 2023-03-06 DIAGNOSIS — Z01818 Encounter for other preprocedural examination: Secondary | ICD-10-CM

## 2023-03-06 DIAGNOSIS — Z01812 Encounter for preprocedural laboratory examination: Secondary | ICD-10-CM | POA: Insufficient documentation

## 2023-03-06 DIAGNOSIS — I10 Essential (primary) hypertension: Secondary | ICD-10-CM

## 2023-03-06 HISTORY — DX: Atrial premature depolarization: I49.1

## 2023-03-06 HISTORY — DX: Supraventricular tachycardia, unspecified: I47.10

## 2023-03-06 HISTORY — DX: Nonrheumatic mitral (valve) insufficiency: I34.0

## 2023-03-06 HISTORY — DX: Diverticulitis of intestine, part unspecified, without perforation or abscess without bleeding: K57.92

## 2023-03-06 HISTORY — DX: Ventricular premature depolarization: I49.3

## 2023-03-06 HISTORY — DX: Cardiomegaly: I51.7

## 2023-03-06 HISTORY — DX: Sepsis, unspecified organism: A41.9

## 2023-03-06 LAB — URINALYSIS, ROUTINE W REFLEX MICROSCOPIC
Bacteria, UA: NONE SEEN
Bilirubin Urine: NEGATIVE
Glucose, UA: NEGATIVE mg/dL
Hgb urine dipstick: NEGATIVE
Ketones, ur: NEGATIVE mg/dL
Nitrite: NEGATIVE
Protein, ur: NEGATIVE mg/dL
Specific Gravity, Urine: 1.015 (ref 1.005–1.030)
pH: 6 (ref 5.0–8.0)

## 2023-03-06 LAB — SURGICAL PCR SCREEN
MRSA, PCR: NEGATIVE
Staphylococcus aureus: NEGATIVE

## 2023-03-06 LAB — BASIC METABOLIC PANEL
Anion gap: 9 (ref 5–15)
BUN: 11 mg/dL (ref 8–23)
CO2: 27 mmol/L (ref 22–32)
Calcium: 8.9 mg/dL (ref 8.9–10.3)
Chloride: 102 mmol/L (ref 98–111)
Creatinine, Ser: 0.6 mg/dL (ref 0.44–1.00)
GFR, Estimated: >60 mL/min (ref >60)
Glucose, Bld: 101 mg/dL — ABNORMAL HIGH (ref 70–99)
Potassium: 3.6 mmol/L (ref 3.5–5.1)
Sodium: 138 mmol/L (ref 135–145)

## 2023-03-06 LAB — CBC
HCT: 41.5 % (ref 36.0–46.0)
Hemoglobin: 12.9 g/dL (ref 12.0–15.0)
MCH: 27 pg (ref 26.0–34.0)
MCHC: 31.1 g/dL (ref 30.0–36.0)
MCV: 87 fL (ref 80.0–100.0)
Platelets: 325 10*3/uL (ref 150–400)
RBC: 4.77 MIL/uL (ref 3.87–5.11)
RDW: 14.5 % (ref 11.5–15.5)
WBC: 5.5 10*3/uL (ref 4.0–10.5)
nRBC: 0 % (ref 0.0–0.2)

## 2023-03-06 NOTE — Patient Instructions (Addendum)
Your procedure is scheduled on:03-12-23 Wednesday Report to the Registration Desk on the 1st floor of the [facility].Then proceed to the 2nd floor Surgery Desk To find out your arrival time, please call [phone] between 1PM - 3PM on:03-11-23 Tuesday If your arrival time is 6:00 am, do not arrive before that time as the [facility] entrance doors do not open until 6:00 am.  REMEMBER: Instructions that are not followed completely may result in serious medical risk, up to and including death; or upon the discretion of your surgeon and anesthesiologist your surgery may need to be rescheduled.  Do not eat food after midnight the night before surgery.  No gum chewing or hard candies.  You may however, drink CLEAR liquids up to 2 hours before you are scheduled to arrive for your surgery. Do not drink anything within 2 hours of your scheduled arrival time.  Clear liquids include: - water  - apple juice without pulp - gatorade (not RED colors) - black coffee or tea (Do NOT add milk or creamers to the coffee or tea) Do NOT drink anything that is not on this list.  One week prior to surgery: Stop Anti-inflammatories (NSAIDS) such as Advil, Aleve, Ibuprofen, Motrin, Naproxen, Naprosyn and Aspirin based products such as Excedrin, Goody's Powder, BC Powder.You may however, take Tylenol if needed for pain up until the day of surgery.  Stop ANY OVER THE COUNTER supplements/vitamins NOW (03-06-23) until after surgery (Calcium +Vitamin D, D-Mannose, Multivitamin, and Vitamin B12)  TAKE ONLY THESE MEDICATIONS THE MORNING OF SURGERY WITH A SIP OF WATER: -atorvastatin (LIPITOR)  -levothyroxine (SYNTHROID)  -metoprolol succinate (TOPROL-XL)   No Alcohol for 24 hours before or after surgery.  No Smoking including e-cigarettes for 24 hours before surgery.  No chewable tobacco products for at least 6 hours before surgery.  No nicotine patches on the day of surgery.  Do not use any "recreational"  drugs for at least a week (preferably 2 weeks) before your surgery.  Please be advised that the combination of cocaine and anesthesia may have negative outcomes, up to and including death. If you test positive for cocaine, your surgery will be cancelled.  On the morning of surgery brush your teeth with toothpaste and water, you may rinse your mouth with mouthwash if you wish. Do not swallow any toothpaste or mouthwash.  Use CHG Soap as directed on instruction sheet.  Do not wear jewelry, make-up, hairpins, clips or nail polish.  Do not wear lotions, powders, or perfumes.   Do not shave body hair from the neck down 48 hours before surgery.  Contact lenses, hearing aids and dentures may not be worn into surgery.  Do not bring valuables to the hospital. [facility] is not responsible for any missing/lost belongings or valuables.   Notify your doctor if there is any change in your medical condition (cold, fever, infection).  Wear comfortable clothing (specific to your surgery type) to the hospital.  After surgery, you can help prevent lung complications by doing breathing exercises.  Take deep breaths and cough every 1-2 hours. Your doctor may order a device called an Incentive Spirometer to help you take deep breaths. When coughing or sneezing, hold a pillow firmly against your incision with both hands. This is called "splinting." Doing this helps protect your incision. It also decreases belly discomfort.  If you are being admitted to the hospital overnight, leave your suitcase in the car. After surgery it may be brought to your room.  In  case of increased patient census, it may be necessary for you, the patient, to continue your postoperative care in the Same Day Surgery department.  If you are being discharged the day of surgery, you will not be allowed to drive home. You will need a responsible individual to drive you home and stay with you for 24 hours after surgery.   If you are  taking public transportation, you will need to have a responsible individual with you.  Please call the Pre-admissions Testing Dept. at [phone] if you have any questions about these instructions.  Surgery Visitation Policy:  Patients having surgery or a procedure may have two visitors.  Children under the age of 30 must have an adult with them who is not the patient.

## 2023-03-06 NOTE — Telephone Encounter (Signed)
Handicap form was placed in the quick sign folder.  Ellie Spickler,cma

## 2023-03-06 NOTE — Progress Notes (Signed)
Heart squeeze is 60-65%, some mild stiffness noted in the muscle likely from high blood pressure, and mild leakage noted in one of the valves that is a normal finding after the age of 29. Overall a reassuring study.

## 2023-03-07 ENCOUNTER — Ambulatory Visit: Payer: Medicare Other | Admitting: Nurse Practitioner

## 2023-03-11 ENCOUNTER — Telehealth: Payer: Self-pay | Admitting: Neurosurgery

## 2023-03-11 NOTE — Telephone Encounter (Signed)
Copy made, form placed up front & pt aware

## 2023-03-12 ENCOUNTER — Other Ambulatory Visit: Payer: Self-pay

## 2023-03-12 ENCOUNTER — Encounter: Payer: Self-pay | Admitting: Neurosurgery

## 2023-03-12 ENCOUNTER — Inpatient Hospital Stay: Payer: Medicare Other | Admitting: Urgent Care

## 2023-03-12 ENCOUNTER — Inpatient Hospital Stay: Payer: Medicare Other

## 2023-03-12 ENCOUNTER — Inpatient Hospital Stay
Admission: RE | Admit: 2023-03-12 | Discharge: 2023-03-12 | DRG: 497 | Disposition: A | Discharge status: Home / Self Care | Payer: Medicare Other | Attending: Neurosurgery | Admitting: Neurosurgery

## 2023-03-12 ENCOUNTER — Inpatient Hospital Stay: Payer: Medicare Other | Admitting: Anesthesiology

## 2023-03-12 ENCOUNTER — Encounter
Admission: RE | Disposition: A | Discharge status: Home / Self Care | Payer: Self-pay | Source: Home / Self Care | Attending: Neurosurgery

## 2023-03-12 DIAGNOSIS — Z8616 Personal history of COVID-19: Secondary | ICD-10-CM | POA: Diagnosis not present

## 2023-03-12 DIAGNOSIS — Z853 Personal history of malignant neoplasm of breast: Secondary | ICD-10-CM | POA: Diagnosis not present

## 2023-03-12 DIAGNOSIS — Z833 Family history of diabetes mellitus: Secondary | ICD-10-CM | POA: Diagnosis not present

## 2023-03-12 DIAGNOSIS — Z881 Allergy status to other antibiotic agents status: Secondary | ICD-10-CM | POA: Diagnosis not present

## 2023-03-12 DIAGNOSIS — Z83438 Family history of other disorder of lipoprotein metabolism and other lipidemia: Secondary | ICD-10-CM | POA: Diagnosis not present

## 2023-03-12 DIAGNOSIS — Y831 Surgical operation with implant of artificial internal device as the cause of abnormal reaction of the patient, or of later complication, without mention of misadventure at the time of the procedure: Secondary | ICD-10-CM | POA: Diagnosis present

## 2023-03-12 DIAGNOSIS — T84498A Other mechanical complication of other internal orthopedic devices, implants and grafts, initial encounter: Secondary | ICD-10-CM | POA: Diagnosis not present

## 2023-03-12 DIAGNOSIS — I10 Essential (primary) hypertension: Secondary | ICD-10-CM | POA: Diagnosis present

## 2023-03-12 DIAGNOSIS — Z8249 Family history of ischemic heart disease and other diseases of the circulatory system: Secondary | ICD-10-CM | POA: Diagnosis not present

## 2023-03-12 DIAGNOSIS — Z79899 Other long term (current) drug therapy: Secondary | ICD-10-CM

## 2023-03-12 DIAGNOSIS — Z810 Family history of intellectual disabilities: Secondary | ICD-10-CM

## 2023-03-12 DIAGNOSIS — Z8261 Family history of arthritis: Secondary | ICD-10-CM | POA: Diagnosis not present

## 2023-03-12 DIAGNOSIS — Z981 Arthrodesis status: Secondary | ICD-10-CM | POA: Diagnosis not present

## 2023-03-12 DIAGNOSIS — Z882 Allergy status to sulfonamides status: Secondary | ICD-10-CM

## 2023-03-12 DIAGNOSIS — M542 Cervicalgia: Secondary | ICD-10-CM | POA: Diagnosis present

## 2023-03-12 DIAGNOSIS — Z87440 Personal history of urinary (tract) infections: Secondary | ICD-10-CM | POA: Diagnosis not present

## 2023-03-12 DIAGNOSIS — E039 Hypothyroidism, unspecified: Secondary | ICD-10-CM | POA: Diagnosis present

## 2023-03-12 DIAGNOSIS — E785 Hyperlipidemia, unspecified: Secondary | ICD-10-CM | POA: Diagnosis not present

## 2023-03-12 DIAGNOSIS — Z96611 Presence of right artificial shoulder joint: Secondary | ICD-10-CM | POA: Diagnosis present

## 2023-03-12 DIAGNOSIS — Z79890 Hormone replacement therapy (postmenopausal): Secondary | ICD-10-CM | POA: Diagnosis not present

## 2023-03-12 DIAGNOSIS — Z96652 Presence of left artificial knee joint: Secondary | ICD-10-CM | POA: Diagnosis present

## 2023-03-12 DIAGNOSIS — Z9889 Other specified postprocedural states: Secondary | ICD-10-CM | POA: Diagnosis not present

## 2023-03-12 DIAGNOSIS — Z923 Personal history of irradiation: Secondary | ICD-10-CM

## 2023-03-12 DIAGNOSIS — Z888 Allergy status to other drugs, medicaments and biological substances status: Secondary | ICD-10-CM | POA: Diagnosis not present

## 2023-03-12 DIAGNOSIS — Z0389 Encounter for observation for other suspected diseases and conditions ruled out: Secondary | ICD-10-CM | POA: Diagnosis not present

## 2023-03-12 DIAGNOSIS — M810 Age-related osteoporosis without current pathological fracture: Secondary | ICD-10-CM | POA: Diagnosis present

## 2023-03-12 DIAGNOSIS — Z87442 Personal history of urinary calculi: Secondary | ICD-10-CM | POA: Diagnosis not present

## 2023-03-12 DIAGNOSIS — Z01818 Encounter for other preprocedural examination: Secondary | ICD-10-CM

## 2023-03-12 HISTORY — PX: HARDWARE REMOVAL: SHX979

## 2023-03-12 SURGERY — REMOVAL, HARDWARE
Anesthesia: General

## 2023-03-12 MED ORDER — OXYCODONE HCL 5 MG PO TABS
ORAL_TABLET | ORAL | Status: AC
  Filled 2023-03-12: qty 1

## 2023-03-12 MED ORDER — ORAL CARE MOUTH RINSE: 15.0000 mL | Freq: Once | OROMUCOSAL | Status: AC

## 2023-03-12 MED ORDER — CEFAZOLIN SODIUM-DEXTROSE 2-4 GM/100ML-% IV SOLN
2.0000 g | Freq: Once | INTRAVENOUS | Status: AC
  Administered 2023-03-12: 2 g via INTRAVENOUS

## 2023-03-12 MED ORDER — LIDOCAINE HCL (CARDIAC) PF 100 MG/5ML IV SOSY
PREFILLED_SYRINGE | INTRAVENOUS | Status: DC | PRN
  Administered 2023-03-12: 60 mg via INTRAVENOUS

## 2023-03-12 MED ORDER — OXYCODONE HCL 5 MG PO TABS: 5.0000 mg | ORAL_TABLET | ORAL | 0 refills | Status: AC | PRN

## 2023-03-12 MED ORDER — FENTANYL CITRATE (PF) 100 MCG/2ML IJ SOLN: 25.0000 ug | INTRAMUSCULAR | Status: DC | PRN

## 2023-03-12 MED ORDER — BUPIVACAINE-EPINEPHRINE (PF) 0.5% -1:200000 IJ SOLN
INTRAMUSCULAR | Status: DC | PRN
  Administered 2023-03-12: 6 mL

## 2023-03-12 MED ORDER — PROPOFOL 1000 MG/100ML IV EMUL
INTRAVENOUS | Status: AC
  Filled 2023-03-12: qty 100

## 2023-03-12 MED ORDER — 0.9 % SODIUM CHLORIDE (POUR BTL) OPTIME
TOPICAL | Status: DC | PRN
  Administered 2023-03-12 (×2): 500 mL

## 2023-03-12 MED ORDER — CHLORHEXIDINE GLUCONATE 0.12 % MT SOLN
15.0000 mL | Freq: Once | OROMUCOSAL | Status: AC
  Administered 2023-03-12: 15 mL via OROMUCOSAL

## 2023-03-12 MED ORDER — BUPIVACAINE-EPINEPHRINE (PF) 0.5% -1:200000 IJ SOLN
INTRAMUSCULAR | Status: AC
  Filled 2023-03-12: qty 30

## 2023-03-12 MED ORDER — FENTANYL CITRATE (PF) 100 MCG/2ML IJ SOLN
INTRAMUSCULAR | Status: DC | PRN
  Administered 2023-03-12 (×2): 50 ug via INTRAVENOUS

## 2023-03-12 MED ORDER — SURGIRINSE WOUND IRRIGATION SYSTEM - OPTIME
TOPICAL | Status: DC | PRN
  Administered 2023-03-12: 900 mL via TOPICAL

## 2023-03-12 MED ORDER — SENNA 8.6 MG PO TABS: 1.0000 | ORAL_TABLET | Freq: Every day | ORAL | 0 refills | Status: DC | PRN

## 2023-03-12 MED ORDER — ONDANSETRON HCL 4 MG/2ML IJ SOLN
INTRAMUSCULAR | Status: DC | PRN
  Administered 2023-03-12 (×2): 4 mg via INTRAVENOUS

## 2023-03-12 MED ORDER — BUPIVACAINE LIPOSOME 1.3 % IJ SUSP
INTRAMUSCULAR | Status: AC
  Filled 2023-03-12: qty 20

## 2023-03-12 MED ORDER — ACETAMINOPHEN 10 MG/ML IV SOLN
INTRAVENOUS | Status: DC | PRN
  Administered 2023-03-12: 1000 mg via INTRAVENOUS

## 2023-03-12 MED ORDER — PHENYLEPHRINE 80 MCG/ML (10ML) SYRINGE FOR IV PUSH (FOR BLOOD PRESSURE SUPPORT)
PREFILLED_SYRINGE | INTRAVENOUS | Status: DC | PRN
  Administered 2023-03-12: 160 ug via INTRAVENOUS
  Administered 2023-03-12 (×2): 80 ug via INTRAVENOUS
  Administered 2023-03-12: 160 ug via INTRAVENOUS

## 2023-03-12 MED ORDER — OXYCODONE HCL 5 MG/5ML PO SOLN: 5.0000 mg | Freq: Once | ORAL | Status: AC | PRN

## 2023-03-12 MED ORDER — BUPIVACAINE HCL (PF) 0.5 % IJ SOLN: INTRAMUSCULAR | Status: DC | PRN

## 2023-03-12 MED ORDER — MIDAZOLAM HCL 2 MG/2ML IJ SOLN
INTRAMUSCULAR | Status: AC
  Filled 2023-03-12: qty 2

## 2023-03-12 MED ORDER — BUPIVACAINE LIPOSOME 1.3 % IJ SUSP
INTRAMUSCULAR | Status: DC | PRN
  Administered 2023-03-12: 60 mL

## 2023-03-12 MED ORDER — OXYCODONE HCL 5 MG PO TABS
5.0000 mg | ORAL_TABLET | Freq: Once | ORAL | Status: AC | PRN
  Administered 2023-03-12: 5 mg via ORAL

## 2023-03-12 MED ORDER — VANCOMYCIN HCL 1000 MG IV SOLR
INTRAVENOUS | Status: AC
  Filled 2023-03-12: qty 20

## 2023-03-12 MED ORDER — DEXAMETHASONE SODIUM PHOSPHATE 10 MG/ML IJ SOLN
INTRAMUSCULAR | Status: DC | PRN
  Administered 2023-03-12: 10 mg via INTRAVENOUS

## 2023-03-12 MED ORDER — FENTANYL CITRATE (PF) 100 MCG/2ML IJ SOLN
INTRAMUSCULAR | Status: AC
  Filled 2023-03-12: qty 2

## 2023-03-12 MED ORDER — GLYCOPYRROLATE 0.2 MG/ML IJ SOLN
INTRAMUSCULAR | Status: DC | PRN
  Administered 2023-03-12: .2 mg via INTRAVENOUS

## 2023-03-12 MED ORDER — BUPIVACAINE HCL (PF) 0.5 % IJ SOLN
INTRAMUSCULAR | Status: AC
  Filled 2023-03-12: qty 30

## 2023-03-12 MED ORDER — FAMOTIDINE 20 MG PO TABS
ORAL_TABLET | ORAL | Status: AC
  Filled 2023-03-12: qty 1

## 2023-03-12 MED ORDER — METHOCARBAMOL 500 MG PO TABS: 500.0000 mg | ORAL_TABLET | Freq: Four times a day (QID) | ORAL | 0 refills | Status: DC | PRN

## 2023-03-12 MED ORDER — CHLORHEXIDINE GLUCONATE 0.12 % MT SOLN
OROMUCOSAL | Status: AC
  Filled 2023-03-12: qty 15

## 2023-03-12 MED ORDER — CEFAZOLIN SODIUM-DEXTROSE 2-4 GM/100ML-% IV SOLN
INTRAVENOUS | Status: AC
  Filled 2023-03-12: qty 100

## 2023-03-12 MED ORDER — PROPOFOL 10 MG/ML IV BOLUS
INTRAVENOUS | Status: DC | PRN
  Administered 2023-03-12: 100 mg via INTRAVENOUS
  Administered 2023-03-12: 50 mg via INTRAVENOUS

## 2023-03-12 MED ORDER — ACETAMINOPHEN 10 MG/ML IV SOLN
INTRAVENOUS | Status: AC
  Filled 2023-03-12: qty 100

## 2023-03-12 MED ORDER — GELATIN ABSORBABLE 12-7 MM EX MISC
CUTANEOUS | Status: AC
  Filled 2023-03-12: qty 1

## 2023-03-12 MED ORDER — THROMBIN 5000 UNITS EX SOLR
CUTANEOUS | Status: AC
  Filled 2023-03-12: qty 5000

## 2023-03-12 MED ORDER — FAMOTIDINE 20 MG PO TABS
20.0000 mg | ORAL_TABLET | Freq: Once | ORAL | Status: AC
  Administered 2023-03-12: 20 mg via ORAL

## 2023-03-12 MED ORDER — LACTATED RINGERS IV SOLN: INTRAVENOUS | Status: DC

## 2023-03-12 MED ORDER — SUCCINYLCHOLINE CHLORIDE 200 MG/10ML IV SOSY
PREFILLED_SYRINGE | INTRAVENOUS | Status: DC | PRN
  Administered 2023-03-12: 80 mg via INTRAVENOUS

## 2023-03-12 MED ORDER — SODIUM CHLORIDE FLUSH 0.9 % IV SOLN
INTRAVENOUS | Status: AC
  Filled 2023-03-12: qty 20

## 2023-03-12 MED ORDER — EPHEDRINE SULFATE (PRESSORS) 50 MG/ML IJ SOLN
INTRAMUSCULAR | Status: DC | PRN
  Administered 2023-03-12 (×2): 5 mg via INTRAVENOUS

## 2023-03-12 SURGICAL SUPPLY — 47 items
ADH SKN CLS APL DERMABOND .7 (GAUZE/BANDAGES/DRESSINGS) ×1
AGENT HMST KT MTR STRL THRMB (HEMOSTASIS) ×2
BASIN KIT SINGLE STR (MISCELLANEOUS) ×1 IMPLANT
CNTNR URN SCR LID CUP LEK RST (MISCELLANEOUS) ×1 IMPLANT
CONT SPEC 4OZ STRL OR WHT (MISCELLANEOUS) ×1
CUP MEDICINE 2OZ PLAST GRAD ST (MISCELLANEOUS) ×3 IMPLANT
DERMABOND ADVANCED .7 DNX12 (GAUZE/BANDAGES/DRESSINGS) ×1 IMPLANT
DRAPE C ARM PK CFD 31 SPINE (DRAPES) ×1 IMPLANT
DRAPE LAPAROTOMY 100X77 ABD (DRAPES) ×1 IMPLANT
DRAPE POUCH INSTRU U-SHP 10X18 (DRAPES) ×1 IMPLANT
DURASEAL SPINE SEALANT 3ML (MISCELLANEOUS) IMPLANT
ELECT CAUTERY BLADE TIP 2.5 (TIP) ×2
ELECT REM PT RETURN 9FT ADLT (ELECTROSURGICAL) ×1
ELECTRODE CAUTERY BLDE TIP 2.5 (TIP) ×2 IMPLANT
ELECTRODE REM PT RTRN 9FT ADLT (ELECTROSURGICAL) ×1 IMPLANT
GLOVE BIOGEL PI IND STRL 6.5 (GLOVE) ×1 IMPLANT
GLOVE SURG SYN 6.5 ES PF (GLOVE) ×1 IMPLANT
GLOVE SURG SYN 6.5 PF PI (GLOVE) ×1 IMPLANT
GLOVE SURG SYN 8.5  E (GLOVE) ×3
GLOVE SURG SYN 8.5 E (GLOVE) ×3 IMPLANT
GLOVE SURG SYN 8.5 PF PI (GLOVE) ×3 IMPLANT
GOWN SRG LRG LVL 4 IMPRV REINF (GOWNS) ×1 IMPLANT
GOWN SRG XL LVL 3 NONREINFORCE (GOWNS) ×2 IMPLANT
GOWN STRL NON-REIN TWL XL LVL3 (GOWNS) ×2
GOWN STRL REIN LRG LVL4 (GOWNS) ×1
JELLY LUBE 2OZ STRL (MISCELLANEOUS) IMPLANT
KIT SPINAL PRONEVIEW (KITS) ×1 IMPLANT
MANIFOLD NEPTUNE II (INSTRUMENTS) ×1 IMPLANT
NDL SAFETY ECLIP 18X1.5 (MISCELLANEOUS) ×1 IMPLANT
NS IRRIG 1000ML POUR BTL (IV SOLUTION) ×1 IMPLANT
PACK LAMINECTOMY NEURO (CUSTOM PROCEDURE TRAY) ×1 IMPLANT
PAD ARMBOARD 7.5X6 YLW CONV (MISCELLANEOUS) ×1 IMPLANT
PATTIES SURGICAL 1X1 (DISPOSABLE) IMPLANT
RASP 3.0MM (RASP) IMPLANT
SOLUTION IRRIG SURGIPHOR (IV SOLUTION) ×1 IMPLANT
SURGIFLO W/THROMBIN 8M KIT (HEMOSTASIS) ×2 IMPLANT
SUT DVC VLOC 3-0 CL 6 P-12 (SUTURE) ×2 IMPLANT
SUT VIC AB 0 CT1 27 (SUTURE) ×3
SUT VIC AB 0 CT1 27XCR 8 STRN (SUTURE) ×3 IMPLANT
SUT VIC AB 2-0 CT1 18 (SUTURE) ×1 IMPLANT
SUT VIC AB 3-0 SH 27 (SUTURE) ×1
SUT VIC AB 3-0 SH 27X BRD (SUTURE) ×1 IMPLANT
SUT VICRYL 0 UR6 27IN ABS (SUTURE) ×1 IMPLANT
SYR 10ML LL (SYRINGE) ×1 IMPLANT
SYR 30ML LL (SYRINGE) IMPLANT
TOWEL OR 17X26 4PK STRL BLUE (TOWEL DISPOSABLE) ×2 IMPLANT
TRAP FLUID SMOKE EVACUATOR (MISCELLANEOUS) ×1 IMPLANT

## 2023-03-12 NOTE — Discharge Instructions (Addendum)
NEUROSURGERY DISCHARGE INSTRUCTIONS  Admission diagnosis: S/P hardware removal [Z98.890]  Operative procedure: hardware removal  What to do after you leave the hospital:  Recommended diet: regular diet. Increase protein intake to promote wound healing.  Recommended activity: no lifting, driving, or strenuous exercise for 4 weeks . You should walk multiple times per day  Special Instructions  No straining, no heavy lifting > 10lbs x 4 weeks.  Keep incision area clean and dry. May shower in 2 days. No baths or pools for 6 weeks.  Please remove dressing tomorrow, no need to apply a bandage afterwards  You have no sutures to remove, the skin is closed with adhesive  Please take pain medications as directed. Take a stool softener if on pain medications   Please Report any of the following: Nausea or Vomiting, Temperature is greater than 101.2F (38.1C) degrees, Dizziness, Abdominal Pain, Difficulty Breathing or Shortness of Breath, Inability to Eat, drink Fluids, or Take medications, Bleeding, swelling, or drainage from surgical incision sites, New numbness or weakness, and Bowel or bladder dysfunction to the neurosurgeon on call at [phone]  Additional Follow up appointments Please follow up with Manning Charity in [facility] as scheduled in 2-3 weeks   Please see below for scheduled appointments:  Future Appointments  Date Time Provider Department Center  03/18/2023  1:45 PM Campbell Lerner, MD [facility] None  03/27/2023 10:30 AM Susanne Borders, PA [facility] None  04/22/2023  1:30 PM Charlsie Quest, NP [facility] None  04/24/2023 10:30 AM Venetia Night, MD [facility] None  05/01/2023  3:00 PM Dana Allan, MD [facility]  06/03/2023 10:30 AM Susanne Borders, PA [facility] None     AMBULATORY SURGERY  DISCHARGE INSTRUCTIONS  The drugs that you were given will stay in your system until tomorrow so for the next 24 hours you should not:  Drive an automobile Make any legal  decisions Drink any alcoholic beverage  You may resume regular meals tomorrow.  Today it is better to start with liquids and gradually work up to solid foods.  You may eat anything you prefer, but it is better to start with liquids, then soup and crackers, and gradually work up to solid foods.  Please notify your doctor immediately if you have any unusual bleeding, trouble breathing, redness and pain at the surgery site, drainage, fever, or pain not relieved by medication.  Additional Instructions:  Please contact your physician with any problems or Same Day Surgery at [phone], Monday through Friday 6 am to 4 pm, or [place] at [facility] number at [phone].

## 2023-03-12 NOTE — Progress Notes (Signed)
Patient has met all discharge requirements and is ready to go home. 0/10 pain. Was able to void, ambulate and tolerate po intake successfully.

## 2023-03-12 NOTE — Anesthesia Preprocedure Evaluation (Addendum)
Anesthesia Evaluation  Patient identified by MRN, date of birth, ID band Patient awake    Reviewed: Allergy & Precautions, NPO status , Patient's Chart, lab work & pertinent test results  Airway Mallampati: II  TM Distance: >3 FB Neck ROM: Full    Dental no notable dental hx. (+) Dental Advidsory Given, Teeth Intact   Pulmonary neg pulmonary ROS, neg COPD   Pulmonary exam normal        Cardiovascular hypertension, Pt. on home beta blockers and Pt. on medications negative cardio ROS Normal cardiovascular exam     Neuro/Psych  Headaches  Neuromuscular disease negative neurological ROS  negative psych ROS   GI/Hepatic negative GI ROS, Neg liver ROS,,,  Endo/Other  negative endocrine ROSHypothyroidism    Renal/GU negative Renal ROS     Musculoskeletal  (+) Arthritis , Osteoarthritis,  Spinal fusion x 2 Scoliosis surgery   Abdominal   Peds  Hematology negative hematology ROS (+)   Anesthesia Other Findings Past Medical History: No date: Arthritis     Comment:  neck, back, left knee;  2009: Breast cancer     Comment:  right lumpectomy s/p radiation x 1 week 2x per day only  No date: Chicken pox No date: Chronic UTI     Comment:  established with urology  No date: COVID-19     Comment:  ? 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  No date: Diverticulitis No date: Esophagitis     Comment:  egd 05/17/14 see report scanned into chart No date: GERD (gastroesophageal reflux disease)     Comment:  h/o No date: History of kidney stones No date: Hormone disorder No date: Hyperlipidemia No date: Hypertension     Comment:  controlled well with medication;  No date: Hypothyroidism No date: LVH (left ventricular hypertrophy)     Comment:  mild No date: MR (mitral regurgitation) No date: Osteoporosis No date: PAC (premature atrial contraction) 2009: Personal  history of radiation therapy     Comment:  F/U right breast cancer No date: PSVT (paroxysmal supraventricular tachycardia) No date: PVC (premature ventricular contraction) No date: Sepsis No date: Thyroid disease     Comment:  hypothyroidism   Past Surgical History: 2018: BONE GRAFT HIP ILIAC CREST     Comment:  + cage left hip 10/23/17  2009: BREAST BIOPSY; Left     Comment:  clip,benign 2009: BREAST BIOPSY; Right     Comment:  + 2009: BREAST LUMPECTOMY; Right     Comment:  2009 lumpectomy  2001: CARDIAC CATHETERIZATION     Comment:  No stents;  results were WNL Dr. requested it for HTN 10/2019: CATARACT EXTRACTION, BILATERAL; Bilateral 05/15/2020: COLONOSCOPY WITH PROPOFOL; N/A     Comment:  Procedure: COLONOSCOPY WITH PROPOFOL;  Surgeon: Toney Reil, MD;  Location: [facility];  Service:               Gastroenterology;  Laterality: N/A; 05/29/2018: INSERTION OF MESH; N/A     Comment:  Procedure: INSERTION OF MESH;  Surgeon: Luretha Murphy,              MD;  Location: [facility];  Service: General;  Laterality:               N/A; 12/05/2020: QUADRICEPS TENDON REPAIR; Right     Comment:  Procedure: OPEN REPAIR OF  RIGHT GLUTEUS MINIMUS TENDON;               Surgeon: Christena Flake, MD;  Location: [facility];                Service: Orthopedics;  Laterality: Right; No date: right gluteus medius/minimus repair Dr. Joice Lofts 11/2020      Comment:  Dr. Joice Lofts 2008, 2009: SHOULDER SURGERY; Left     Comment:  x2 rotator cuff surgeries left shoulders 07/24/2020: SPINAL FUSION 10/23/2017: SPINE SURGERY     Comment:  spinal 09/2017 h/o scoliosis [facility] L4-S1 OLIF T10 to               ililum fusion No date: TONSILLECTOMY     Comment:  age 77 y.o.  03/18/2022: TOTAL KNEE ARTHROPLASTY; Left     Comment:  Procedure: TOTAL KNEE ARTHROPLASTY;  Surgeon:               Joen Laura, MD;  Location: [facility];  Service:               Orthopedics;  Laterality: Left; 2012: TOTAL  SHOULDER REPLACEMENT; Right No date: TUBAL LIGATION     Comment:  age 57 05/29/2018: VENTRAL HERNIA REPAIR; N/A     Comment:  Procedure: LAPAROSCOPIC VENTRAL / LATERAL HERNIA REPAIR;              Surgeon: Luretha Murphy, MD;  Location: [facility];                Service: General;  Laterality: N/A;  BMI    Body Mass Index: 27.27 kg/m      Reproductive/Obstetrics negative OB ROS                             Anesthesia Physical Anesthesia Plan  ASA: 2  Anesthesia Plan: General   Post-op Pain Management:    Induction: Intravenous  PONV Risk Score and Plan: 3 and Ondansetron, Dexamethasone and Treatment may vary due to age or medical condition  Airway Management Planned: Oral ETT  Additional Equipment:   Intra-op Plan:   Post-operative Plan: Extubation in OR  Informed Consent: I have reviewed the patients History and Physical, chart, labs and discussed the procedure including the risks, benefits and alternatives for the proposed anesthesia with the patient or authorized representative who has indicated his/her understanding and acceptance.     Dental advisory given  Plan Discussed with: CRNA  Anesthesia Plan Comments: (Patient consented for risks of anesthesia including but not limited to:  - adverse reactions to medications - damage to eyes, teeth, lips or other oral mucosa - nerve damage due to positioning  - sore throat or hoarseness - Damage to heart, brain, nerves, lungs, other parts of body or loss of life  Patient voiced understanding.)        Anesthesia Quick Evaluation

## 2023-03-12 NOTE — Anesthesia Postprocedure Evaluation (Signed)
Anesthesia Post Note  Patient: Maria Spencer  Procedure(s) Performed: REMOVAL OF ILIAC SCREWS  Patient location during evaluation: PACU Anesthesia Type: General Level of consciousness: awake and alert Pain management: pain level controlled Vital Signs Assessment: post-procedure vital signs reviewed and stable Respiratory status: spontaneous breathing, nonlabored ventilation, respiratory function stable and patient connected to nasal cannula oxygen Cardiovascular status: blood pressure returned to baseline and stable Postop Assessment: no apparent nausea or vomiting Anesthetic complications: no  No notable events documented.   Last Vitals:  Vitals:   03/12/23 0935 03/12/23 0959  BP:  (!) 149/88  Pulse: 81 83  Resp: 20 15  Temp:  (!) 36.3 C  SpO2: 97% 100%    Last Pain:  Vitals:   03/12/23 0959  TempSrc: Temporal  PainSc: 0-No pain                 Stephanie Coup

## 2023-03-12 NOTE — Anesthesia Procedure Notes (Addendum)
Procedure Name: Intubation Date/Time: 03/12/2023 7:15 AM  Performed by: Mohammed Kindle, CRNAPre-anesthesia Checklist: Patient identified, Emergency Drugs available, Suction available and Patient being monitored Patient Re-evaluated:Patient Re-evaluated prior to induction Oxygen Delivery Method: Circle system utilized Preoxygenation: Pre-oxygenation with 100% oxygen Induction Type: IV induction Ventilation: Mask ventilation without difficulty Laryngoscope Size: McGraph and 3 Grade View: Grade II Tube type: Oral Tube size: 6.5 mm Number of attempts: 1 Airway Equipment and Method: Stylet and Oral airway Placement Confirmation: ETT inserted through vocal cords under direct vision, positive ETCO2, breath sounds checked- equal and bilateral and CO2 detector Secured at: 21 cm Tube secured with: Tape Dental Injury: Teeth and Oropharynx as per pre-operative assessment

## 2023-03-12 NOTE — Op Note (Signed)
Indications: Ms. Whitmer is a 77 year old female who is suffering from Z98.1 - S/P lumbar fusion, T84.498S - Mechanical complication of internal fixation device such as nail, plate or rod, sequela .  She previously underwent a T10 to pelvis posterior spinal fusion at an outside institution and is well-healed from that.  Due to ongoing pain, she elected for removal of her S2 alar iliac screws.  Findings: Removal of her S2 alar iliac screws  Preoperative Diagnosis:  Z98.1 - S/P lumbar fusion, T84.498S - Mechanical complication of internal fixation device such as nail, plate or rod, sequela  Postoperative Diagnosis: same   EBL: 10 ml IVF: see AR ml Drains: none Disposition: Extubated and Stable to PACU Complications: none  No foley catheter was placed.   Preoperative Note:   Risks of surgery discussed include: infection, bleeding, stroke, coma, death, paralysis, CSF leak, nerve/spinal cord injury, numbness, tingling, weakness, complex regional pain syndrome, recurrent stenosis and/or disc herniation, vascular injury, development of instability, neck/back pain, need for further surgery, persistent symptoms, development of deformity, and the risks of anesthesia. The patient understood these risks and agreed to proceed.  Operative Note:  1.  Removal of bilateral S2 alar iliac screws   The patient was brought to the Operating Room, intubated and turned into the prone position. All pressure points were checked and double checked.  The prior incision was identified. The patient was prepped and draped in the standard fashion. A full timeout was performed. Preoperative antibiotics were given. The incision was injected with local anesthetic.  The incision was opened with a scalpel, then the soft tissues divided with the Bovie.   2 separate fascial incisions were made to allow for direct approach to the S2 alar iliac screws.  These were identified and fully exposed.  It was noted that these were  closed tulip heads.  A carbide bit was used to divide the rods bilaterally.  The inferior most 2 cm of rod was then removed.  Each S2 alar iliac screw was then removed.  We utilized hemostatic matrix to achieve hemostasis from the screw holes.  The wound was copiously irrigated.  Hemostasis was achieved.  We then closed the incision in layers using Vicryl suture.  Monocryl and Dermabond were placed on the skin.  The patient was then flipped supine and extubated with incident. All counts were correct times 2 at the end of the case. No immediate complications were noted.  Manning Charity PA assisted in the entire procedure. An assistant was required for this procedure due to the complexity.  The assistant provided assistance in tissue manipulation and suction, and was required for the successful and safe performance of the procedure. I performed the critical portions of the procedure.   Venetia Night MD

## 2023-03-12 NOTE — Discharge Summary (Signed)
Discharge Summary  Patient ID: Maria Spencer MRN: [id number] DOB/AGE: 77-Oct-1947 77 y.o.  Admit date: 03/12/2023 Discharge date: 03/12/2023  Admission Diagnoses: Z98.1 - S/P lumbar fusion, T84.498S - Mechanical complication of internal fixation device   Discharge Diagnoses:  Principal Problem:   S/P hardware removal Active Problems:   Failed orthopedic implant   S/P spinal fusion   Discharged Condition: good  Hospital Course:  Maria Spencer is a 77 y.o presenting for removal of S2 alar iliac screws. Her intraoperative course was uncomplicated and she was discharged home after ambulating, urinating, and tolerating PO intake. She was given prescriptions for pain medication, muscle relaxer, and stool softener.   Consults:  none  Significant Diagnostic Studies: none  Treatments: surgery: as above. Please see separately dictated operative report for further details  Discharge Exam: Blood pressure (!) 151/80, pulse 91, temperature (!) 97.1 F (36.2 C), resp. rate 20, height  (1.499 m), weight 61.2 kg, SpO2 98 %. CN II-XII grossly intact MAEW Incision c/d/i  Disposition: Discharge disposition: 01-Home or Self Care       Discharge Instructions     Incentive spirometry RT   Complete by: As directed       Allergies as of 03/12/2023       Reactions   Sulfa Antibiotics Anaphylaxis, Hives, Itching, Shortness Of Breath, Swelling   Lips & throat swelled   Nitrofuran Derivatives Swelling   Lip swelling; "eyes get red and puffy"   Amlodipine Besylate Other (See Comments)   gas, bloating  Heart racing         Medication List     STOP taking these medications    traMADol 50 MG tablet Commonly known as: ULTRAM       TAKE these medications    atorvastatin 20 MG tablet Commonly known as: LIPITOR Take 1 tablet (20 mg total) by mouth daily. What changed:  when to take this additional instructions   cholecalciferol 25 MCG (1000 UNIT) tablet Commonly  known as: VITAMIN D3 Take 1,000 Units by mouth daily.   [employer] CALCIUM 600 & VITAMIN D3 PO Take 1 tablet by mouth daily.   cyanocobalamin 1000 MCG tablet Commonly known as: VITAMIN B12 Take 1,000 mcg by mouth daily.   D-MANNOSE PO Take 1,300 mg by mouth daily.   levothyroxine 88 MCG tablet Commonly known as: SYNTHROID Take 1 tablet (88 mcg total) by mouth daily before breakfast. 30 minutes   methocarbamol 500 MG tablet Commonly known as: ROBAXIN Take 1 tablet (500 mg total) by mouth every 6 (six) hours as needed for muscle spasms.   metoprolol succinate 50 MG 24 hr tablet Commonly known as: TOPROL-XL Take 1 tablet (50 mg total) by mouth 2 (two) times daily. Take with or immediately following a meal.   Multi-Vitamins Tabs Take 1 tablet by mouth daily.   oxyCODONE 5 MG immediate release tablet Commonly known as: Roxicodone Take 1 tablet (5 mg total) by mouth every 4 (four) hours as needed for up to 5 days for severe pain.   senna 8.6 MG Tabs tablet Commonly known as: SENOKOT Take 1 tablet (8.6 mg total) by mouth daily as needed.   telmisartan 80 MG tablet Commonly known as: MICARDIS Take 1 tablet (80 mg total) by mouth daily. What changed: when to take this        Follow-up Information     Susanne Borders, PA Follow up on 03/27/2023.   Specialty: Neurosurgery Why: Post-Op follow up appt time is 10:30am (please plan  to arrive 15 mins prior to scheduled appt time) Contact information: [address] [place] [address] [phone]                 Signed: Susanne Borders 03/12/2023, 9:11 AM

## 2023-03-12 NOTE — Transfer of Care (Signed)
Immediate Anesthesia Transfer of Care Note  Patient: Maria Spencer  Procedure(s) Performed: REMOVAL OF ILIAC SCREWS  Patient Location: PACU  Anesthesia Type:General  Level of Consciousness: awake, drowsy, and patient cooperative  Airway & Oxygen Therapy: Patient Spontanous Breathing and Patient connected to face mask oxygen  Post-op Assessment: Report given to RN and Post -op Vital signs reviewed and stable  Post vital signs: Reviewed and stable  Last Vitals:  Vitals Value Taken Time  BP 147/97 03/12/23 0845  Temp 36.1 C 03/12/23 0835  Pulse 92 03/12/23 0849  Resp 20 03/12/23 0849  SpO2 97 % 03/12/23 0849  Vitals shown include unvalidated device data.  Last Pain:  Vitals:   03/12/23 0835  TempSrc:   PainSc: 0-No pain      Patients Stated Pain Goal: 1 (03/12/23 4098)  Complications: No notable events documented.

## 2023-03-12 NOTE — Interval H&P Note (Signed)
History and Physical Interval Note:  03/12/2023 6:55 AM  Maria Spencer  has presented today for surgery, with the diagnosis of Z98.1 - S/P lumbar fusion T84.498S - Mechanical complication of internal fixation device such as nail, plate or rod, sequela.  The various methods of treatment have been discussed with the patient and family. After consideration of risks, benefits and other options for treatment, the patient has consented to  Procedure(s): REMOVAL OF ILIAC SCREWS (N/A) as a surgical intervention.  The patient's history has been reviewed, patient examined, no change in status, stable for surgery.  I have reviewed the patient's chart and labs.  Questions were answered to the patient's satisfaction.    Heart sounds normal no MRG. Chest Clear to Auscultation Bilaterally.   Alie Moudy

## 2023-03-13 ENCOUNTER — Other Ambulatory Visit: Payer: Self-pay | Admitting: Cardiology

## 2023-03-13 ENCOUNTER — Telehealth: Payer: Self-pay

## 2023-03-13 ENCOUNTER — Encounter: Payer: Self-pay | Admitting: Neurosurgery

## 2023-03-13 DIAGNOSIS — I10 Essential (primary) hypertension: Secondary | ICD-10-CM

## 2023-03-13 NOTE — Transitions of Care (Post Inpatient/ED Visit) (Signed)
   03/13/2023  Name: Maria Spencer MRN: [id number] DOB: 05-Oct-1946  Today's TOC FU Call Status: Today's TOC FU Call Status:: Successful TOC FU Call Competed TOC FU Call Complete Date: 03/13/23  Transition Care Management Follow-up Telephone Call Date of Discharge: 03/12/23 Discharge Facility: [facility]) Type of Discharge: Inpatient Admission Primary Inpatient Discharge Diagnosis:: removal of iliac screws How have you been since you were released from the hospital?: Better (I am sore but my pain is better with the muscle relaxers and pain medications) Any questions or concerns?: No  Items Reviewed: Did you receive and understand the discharge instructions provided?: Yes Medications obtained and verified?: Yes (Medications Reviewed) Any new allergies since your discharge?: No Dietary orders reviewed?: NA Do you have support at home?: Yes People in Home: child(ren), adult Name of Support/Comfort Primary Source: son Myra Rude  Home Care and Equipment/Supplies: Were Home Health Services Ordered?: NA Any new equipment or medical supplies ordered?: NA (already has walker and cane)  Functional Questionnaire: Do you need assistance with bathing/showering or dressing?: No Do you need assistance with meal preparation?: No Do you need assistance with eating?: No Do you have difficulty maintaining continence: No Do you need assistance with getting out of bed/getting out of a chair/moving?: No Do you have difficulty managing or taking your medications?: No  Follow up appointments reviewed: PCP Follow-up appointment confirmed?: NA (Delines PCP appt as she is being seen by specialist) Specialist Hospital Follow-up appointment confirmed?: Yes Date of Specialist follow-up appointment?: 03/27/23 Follow-Up Specialty Provider:: Neuosurgery Manning Charity PA Do you need transportation to your follow-up appointment?: No Do you understand care options if your  condition(s) worsen?: Yes-patient verbalized understanding  SDOH Interventions Today    Flowsheet Row Most Recent Value  SDOH Interventions   Food Insecurity Interventions Intervention Not Indicated  Housing Interventions Intervention Not Indicated  Transportation Interventions Intervention Not Indicated      TOC Interventions Today    Flowsheet Row Most Recent Value  TOC Interventions   TOC Interventions Discussed/Reviewed TOC Interventions Discussed, Post discharge activity limitations per provider, Post op wound/incision care, S/S of infection       Interventions Today    Flowsheet Row Most Recent Value  General Interventions   General Interventions Discussed/Reviewed General Interventions Discussed  Education Interventions   Education Provided Provided Education  Provided Verbal Education On Other, Medication  [Reviewed ways to help prevent constipation while taking pain medication]  Safety Interventions   Safety Discussed/Reviewed Safety Discussed, Home Safety  Home Safety Assistive Devices  [Reviewed to use cane as needed]        SIGNATURE  Dudley Major RN, [employer], CDE Care Management Coordinator [facility] Care Management [phone]

## 2023-03-18 ENCOUNTER — Ambulatory Visit (INDEPENDENT_AMBULATORY_CARE_PROVIDER_SITE_OTHER): Payer: Medicare Other | Admitting: Surgery

## 2023-03-18 ENCOUNTER — Encounter: Payer: Self-pay | Admitting: Surgery

## 2023-03-18 VITALS — BP 174/78 | HR 90 | Temp 98.1°F | Ht 59.0 in | Wt 135.0 lb

## 2023-03-18 DIAGNOSIS — K5732 Diverticulitis of large intestine without perforation or abscess without bleeding: Secondary | ICD-10-CM | POA: Diagnosis not present

## 2023-03-18 DIAGNOSIS — K5792 Diverticulitis of intestine, part unspecified, without perforation or abscess without bleeding: Secondary | ICD-10-CM

## 2023-03-18 MED ORDER — METRONIDAZOLE 500 MG PO TABS: ORAL_TABLET | ORAL | 0 refills | Status: DC

## 2023-03-18 MED ORDER — POLYETHYLENE GLYCOL 3350 17 GM/SCOOP PO POWD: ORAL | 0 refills | Status: DC

## 2023-03-18 MED ORDER — BISACODYL 5 MG PO TBEC: DELAYED_RELEASE_TABLET | ORAL | 0 refills | Status: DC

## 2023-03-18 MED ORDER — NEOMYCIN SULFATE 500 MG PO TABS: ORAL_TABLET | ORAL | 0 refills | Status: DC

## 2023-03-18 NOTE — Patient Instructions (Addendum)
Please pick up your prescriptions at your pharmacy.   Our surgery scheduler Britta Mccreedy will call you within 24-48 hours to get you scheduled. If you have not heard from her after 48 hours, please call our office. Have the blue sheet available when she calls to write down important information.   If you have any concerns or questions, please feel free to call our office.   Diverticulitis  Diverticulitis is when small pouches in your colon get infected or swollen. This causes pain in your belly (abdomen) and watery poop (diarrhea). The small pouches are called diverticula. They may form if you have a condition called diverticulosis. What are the causes? You may get this condition if poop (stool) gets trapped in the pouches in your colon. The poop lets germs (bacteria) grow. This causes an infection. What increases the risk? You are more likely to get this condition if you have small pouches in your colon. You are also more likely to get it if: You are overweight or very overweight (obese). You do not exercise enough. You drink alcohol. You smoke. You eat a lot of red meat, like beef, pork, or lamb. You do not eat enough fiber. You are older than 77 years of age. What are the signs or symptoms? Pain in your belly. Pain is often on the left side, but it may be felt in other spots too. Fever and chills. Feeling like you may vomit. Vomiting. Having cramps. Feeling full. Changes in how often you poop. Blood in your poop. How is this treated? Most cases are treated at home. You may be told to: Take over-the-counter pain medicines. Only eat and drink clear liquids. Take antibiotics. Rest. Very bad cases may need to be treated at a hospital. Treatment may include: Not eating or drinking. Taking pain medicines. Getting antibiotics through an IV tube. Getting fluid and food through an IV tube. Having surgery. When you are feeling better, you may need to have a test to look at your colon  (colonoscopy). Follow these instructions at home: Medicines Take over-the-counter and prescription medicines only as told by your doctor. These include: Fiber pills. Probiotics. Medicines to make your poop soft (stool softeners). If you were prescribed antibiotics, take them as told by your doctor. Do not stop taking them even if you start to feel better. Ask your doctor if you should avoid driving or using machines while you are taking your medicine. Eating and drinking  Follow the diet told by your doctor. You may need to only eat and drink liquids. When you feel better, you may be able to eat more foods. You may also be told to eat a lot of fiber. Fiber helps you poop. Foods with fiber include berries, beans, lentils, and green vegetables. Try not to eat red meat. General instructions Do not smoke or use any products that contain nicotine or tobacco. If you need help quitting, ask your doctor. Exercise 3 or more times a week. Try to go for 30 minutes each time. Exercise enough to sweat and make your heart beat faster. Contact a doctor if: Your pain gets worse. You are not pooping like normal. Your symptoms do not get better. Your symptoms get worse very fast. You have a fever. You vomit more than one time. You have poop that is: Bloody. Black. Tarry. This information is not intended to replace advice given to you by your health care provider. Make sure you discuss any questions you have with your health care provider. Document  Revised: 08/08/2022 Document Reviewed: 08/08/2022 Elsevier Patient Education  2023 [employer].

## 2023-03-18 NOTE — H&P (View-Only) (Signed)
Patient ID: Maria Spencer, female   DOB: 1946-02-21, 77 y.o.   MRN: [id number]  Chief Complaint:  Diverticulitis.   History of Present Illness Maria Spencer is a 77 y.o. female with a 5-year history of intermittent diverticulitis, frequently treated, and most recently treated with antibiotics.  Given an ultimatum by her gastroenterologist they would no longer prescribe her antibiotics.  It has been accumulatively worse since an episode where she had a pericolonic abscess, this resolved without percutaneous drainage, however she was hospitalized for antibiotics at that time.  She reports she does continue to have episodes of fevers and chills associated with these attacks.  She reports having painful bowel movements, the pain is located in her lower pelvis left quadrant worse than right, not relieved with bowel movement but made worse by needing to have a bowel movement.  She reports she is very irregular with her bowel activity, has never tolerated fiber well.  Last colonoscopy was 3 years ago.  Most recently her bowel movements have been loose, and overall indicates a smaller caliber of feces.  Her biggest dilemma as concern regarding right hip injury, anticipating an operation which was supposed to happen earlier this month to preserve her right hip.  However this had to be canceled due to another attack of diverticulitis.  She is concerned about this happening again, or even after or during her convalescence.  Last colonoscopy 04/2020.    Past Medical History Past Medical History:  Diagnosis Date   Arthritis    neck, back, left knee;    Breast cancer 2009   right lumpectomy s/p radiation x 1 week 2x per day only    Chicken pox    Chronic UTI    established with urology    COVID-19    ? 12/2020 sob  took antiviral, 08/15/21   Diverticular disease    -osis and -itis    Diverticulitis    Esophagitis    egd 05/17/14 see report scanned into chart   GERD (gastroesophageal reflux disease)     h/o   History of kidney stones    Hormone disorder    Hyperlipidemia    Hypertension    controlled well with medication;    Hypothyroidism    LVH (left ventricular hypertrophy)    mild   MR (mitral regurgitation)    Osteoporosis    PAC (premature atrial contraction)    Personal history of radiation therapy 2009   F/U right breast cancer   PSVT (paroxysmal supraventricular tachycardia)    PVC (premature ventricular contraction)    Sepsis    Thyroid disease    hypothyroidism       Past Surgical History:  Procedure Laterality Date   BONE GRAFT HIP ILIAC CREST  2018   + cage left hip 10/23/17    BREAST BIOPSY Left 2009   clip,benign   BREAST BIOPSY Right 2009   +   BREAST LUMPECTOMY Right 2009   2009 lumpectomy    CARDIAC CATHETERIZATION  2001   No stents;  results were WNL Dr. requested it for HTN   CATARACT EXTRACTION, BILATERAL Bilateral 10/2019   COLONOSCOPY WITH PROPOFOL N/A 05/15/2020   Procedure: COLONOSCOPY WITH PROPOFOL;  Surgeon: Toney Reil, MD;  Location[facility] ENDOSCOPY;  Service: Gastroenterology;  Laterality: N/A;   HARDWARE REMOVAL N/A 03/12/2023   Procedure: REMOVAL OF ILIAC SCREWS;  Surgeon: Venetia Night, MD;  Location: [facility] ORS;  Service: Neurosurgery;  Laterality: N/A;   INSERTION OF MESH N/A 05/29/2018  Procedure: INSERTION OF MESH;  Surgeon: Luretha Murphy, MD;  Location: [facility] ORS;  Service: General;  Laterality: N/A;   QUADRICEPS TENDON REPAIR Right 12/05/2020   Procedure: OPEN REPAIR OF RIGHT GLUTEUS MINIMUS TENDON;  Surgeon: Christena Flake, MD;  Location: [facility] ORS;  Service: Orthopedics;  Laterality: Right;   right gluteus medius/minimus repair Dr. Joice Lofts 11/2020      Dr. Joice Lofts   SHOULDER SURGERY Left 2008, 2009   x2 rotator cuff surgeries left shoulders   SPINAL FUSION  07/24/2020   SPINE SURGERY  10/23/2017   spinal 09/2017 h/o scoliosis [facility] L4-S1 OLIF T10 to ililum fusion   TONSILLECTOMY     age 18 y.o.    TOTAL KNEE ARTHROPLASTY  Left 03/18/2022   Procedure: TOTAL KNEE ARTHROPLASTY;  Surgeon: Joen Laura, MD;  Location: [facility] ORS;  Service: Orthopedics;  Laterality: Left;   TOTAL SHOULDER REPLACEMENT Right 2012   TUBAL LIGATION     age 65   VENTRAL HERNIA REPAIR N/A 05/29/2018   Procedure: LAPAROSCOPIC VENTRAL / LATERAL HERNIA REPAIR;  Surgeon: Luretha Murphy, MD;  Location: [facility] ORS;  Service: General;  Laterality: N/A;    Allergies  Allergen Reactions   Sulfa Antibiotics Anaphylaxis, Hives, Itching, Shortness Of Breath and Swelling    Lips & throat swelled   Nitrofuran Derivatives Swelling    Lip swelling; "eyes get red and puffy"   Amlodipine Besylate Other (See Comments)    gas, bloating  Maria racing     Current Outpatient Medications  Medication Sig Dispense Refill   atorvastatin (LIPITOR) 20 MG tablet Take 1 tablet (20 mg total) by mouth daily. (Patient taking differently: Take 20 mg by mouth every other day. AM) 90 tablet 3   bisacodyl (DULCOLAX) 5 MG EC tablet Take all 4 tabs at 8 am the morning prior to surgery. 4 tablet 0   Calcium Carb-Cholecalciferol ([employer] CALCIUM 600 & VITAMIN D3 PO) Take 1 tablet by mouth daily.     cholecalciferol (VITAMIN D) 25 MCG (1000 UNIT) tablet Take 1,000 Units by mouth daily.     D-MANNOSE PO Take 1,300 mg by mouth daily.     levothyroxine (SYNTHROID) 88 MCG tablet Take 1 tablet (88 mcg total) by mouth daily before breakfast. 30 minutes 90 tablet 3   methocarbamol (ROBAXIN) 500 MG tablet Take 1 tablet (500 mg total) by mouth every 6 (six) hours as needed for muscle spasms. 120 tablet 0   metoprolol succinate (TOPROL-XL) 50 MG 24 hr tablet TAKE 1 TABLET (50 MG TOTAL) BY MOUTH TWICE A DAY .TAKE WITH OR IMMEDIATELY FOLLOWING A MEAL 180 tablet 0   metroNIDAZOLE (FLAGYL) 500 MG tablet Take 2 tabs (1000 mg total) at 8 am, again at 2 pm, and again at 8 pm the day prior to surgery. 6 tablet 0   Multiple Vitamin (MULTI-VITAMINS) TABS Take 1 tablet by mouth daily.     neomycin  (MYCIFRADIN) 500 MG tablet Take 2 tabs (1000 mg total) at 8 am, again at 2 pm, and again at 8 pm the day prior to surgery. 6 tablet 0   polyethylene glycol powder (MIRALAX) 17 GM/SCOOP powder Mix with 64 ounces of Gatorade (no red) the day prior to surgery and drink as directed. 238 g 0   senna (SENOKOT) 8.6 MG TABS tablet Take 1 tablet (8.6 mg total) by mouth daily as needed. 30 tablet 0   telmisartan (MICARDIS) 80 MG tablet Take 1 tablet (80 mg total) by mouth daily. (Patient taking differently:  Take 80 mg by mouth every morning.) 90 tablet 3   vitamin B-12 (CYANOCOBALAMIN) 1000 MCG tablet Take 1,000 mcg by mouth daily.     No current facility-administered medications for this visit.    Family History Family History  Problem Relation Age of Onset   Arthritis Mother    Maria disease Mother 43       stent    Hyperlipidemia Mother    Hypertension Mother    Coronary artery disease Mother 41   Arthritis Father    Diabetes Father    Cancer Father        colon   AAA (abdominal aortic aneurysm) Father    Arthritis Sister    Hyperlipidemia Sister    Hypertension Sister    Cancer Brother        lung, smoker   Mental retardation Brother    Drug abuse Daughter        overdose in 2018    Arthritis Sister    Hyperlipidemia Sister    Bladder Cancer Neg Hx    Kidney cancer Neg Hx    Breast cancer Neg Hx       Social History Social History   Tobacco Use   Smoking status: Never    Passive exposure: Never   Smokeless tobacco: Never  Vaping Use   Vaping Use: Never used  Substance Use Topics   Alcohol use: Not Currently    Comment: rare   Drug use: No        Review of Systems  Constitutional:  Positive for chills and fever.  HENT: Negative.    Eyes: Negative.   Respiratory: Negative.    Cardiovascular: Negative.   Gastrointestinal:  Positive for abdominal pain and diarrhea. Negative for blood in stool, melena, nausea and vomiting.  Genitourinary: Negative.   Skin:  Negative.   Neurological: Negative.   Psychiatric/Behavioral: Negative.       Physical Exam Blood pressure (!) 174/78, pulse 90, temperature 98.1 F (36.7 C), temperature source Oral, height 4\' 11"  (1.499 m), weight 135 lb (61.2 kg), SpO2 99 %. Last Weight  Most recent update: 03/18/2023  2:00 PM    Weight  61.2 kg (135 lb)             CONSTITUTIONAL: Well developed, and nourished, appropriately responsive and aware without distress.   EYES: Sclera non-icteric.   EARS, NOSE, MOUTH AND THROAT:  The oropharynx is clear. Oral mucosa is pink and moist.   Hearing is intact to voice.  NECK: Trachea is midline, and there is no jugular venous distension.  LYMPH NODES:  Lymph nodes in the neck are not appreciated. RESPIRATORY:  Lungs are clear, and breath sounds are equal bilaterally.  Normal respiratory effort without pathologic use of accessory muscles. CARDIOVASCULAR: Maria is regular in rate and rhythm.   Well perfused.  GI: The abdomen is  soft, nontender, and nondistended. There were no palpable masses.  I did not appreciate hepatosplenomegaly.   There is a left lower quadrant scar, and no appreciable fascial defect hernia along the midline as noted on her CT scan.  MUSCULOSKELETAL:  Symmetrical muscle tone appreciated in all four extremities.    SKIN: Skin turgor is normal. No pathologic skin lesions appreciated.  NEUROLOGIC:  Motor and sensation appear grossly normal.  Cranial nerves are grossly without defect. PSYCH:  Alert and oriented to person, place and time. Affect is appropriate for situation.  Data Reviewed I have personally reviewed what is currently available of the  patient's imaging, recent labs and medical records.   Labs:     Latest Ref Rng & Units 03/06/2023   10:52 AM 05/23/2022   11:15 AM 03/19/2022    3:16 AM  CBC  WBC 4.0 - 10.5 K/uL 5.5  5.5  8.2   Hemoglobin 12.0 - 15.0 g/dL 69.6  29.5  28.4   Hematocrit 36.0 - 46.0 % 41.5  41.4  35.7   Platelets 150 - 400  K/uL 325  227.0  190       Latest Ref Rng & Units 03/06/2023   10:52 AM 03/19/2022    3:16 AM 03/11/2022    9:17 AM  CMP  Glucose 70 - 99 mg/dL 132  440  99   BUN 8 - 23 mg/dL 11  12  21    Creatinine 0.44 - 1.00 mg/dL 1.02  7.25  3.66   Sodium 135 - 145 mmol/L 138  136  139   Potassium 3.5 - 5.1 mmol/L 3.6  3.9  3.6   Chloride 98 - 111 mmol/L 102  104  105   CO2 22 - 32 mmol/L 27  24  28    Calcium 8.9 - 10.3 mg/dL 8.9  8.4  9.1   Total Protein 6.5 - 8.1 g/dL   7.1   Total Bilirubin 0.3 - 1.2 mg/dL   0.6   Alkaline Phos 38 - 126 U/L   72   AST 15 - 41 U/L   19   ALT 0 - 44 U/L   15     Imaging: Radiological images reviewed:  CLINICAL DATA:  Left lower quadrant abdominal pain with cramping and diarrhea for the past few weeks. History of diverticulitis.   EXAM: CT ABDOMEN AND PELVIS WITH CONTRAST   TECHNIQUE: Multidetector CT imaging of the abdomen and pelvis was performed using the standard protocol following bolus administration of intravenous contrast.   RADIATION DOSE REDUCTION: This exam was performed according to the departmental dose-optimization program which includes automated exposure control, adjustment of the mA and/or kV according to patient size and/or use of iterative reconstruction technique.   CONTRAST:  OMNIPAQUE IOHEXOL 300 MG/ML  SOLN   COMPARISON:  CT abdomen pelvis dated March 19, 2021.   FINDINGS: Lower chest: No acute abnormality. Unchanged small right fat containing Bochdalek hernia.   Hepatobiliary: Unchanged subcentimeter cyst in the superior left liver. No other focal liver abnormality. The gallbladder is unremarkable. No biliary dilatation.   Pancreas: Unremarkable. No pancreatic ductal dilatation or surrounding inflammatory changes.   Spleen: Normal in size without focal abnormality.   Adrenals/Urinary Tract: Adrenal glands are unremarkable. Kidneys are normal, without renal calculi, focal lesion, or hydronephrosis. Bladder is  unremarkable.   Stomach/Bowel: The stomach is within normal limits. Small bowel is unremarkable. Extensive left-sided colonic diverticulosis again noted with severe wall thickening of the distal sigmoid colon and mild surrounding inflammatory change. Normal appendix.   Vascular/Lymphatic: Aortic atherosclerosis. No enlarged abdominal or pelvic lymph nodes.   Reproductive: Uterus and bilateral adnexa are unremarkable.   Other: Unchanged fat containing umbilical and infraumbilical hernias. Similar laxity of the lower left abdominal wall. No fluid collection or pneumoperitoneum.   Musculoskeletal: No acute or significant osseous findings. Extensive thoracolumbar fusion to the pelvis again noted.   IMPRESSION: 1. Acute sigmoid diverticulitis. No perforation or abscess. 2. Aortic Atherosclerosis (ICD10-I70.0).     Electronically Signed   By: Obie Dredge M.D.   On: 02/12/2022 13:18  I also have a report from [facility]  orthopedic specialist of a pelvic MRI which suggest that there is sigmoid colonic thickening, this is dated December/04/2022.   Within last 24 hrs: No results found.  Assessment    Sigmoid diverticulitis. Patient Active Problem List   Diagnosis Date Noted   Failed orthopedic implant 03/12/2023   S/P spinal fusion 03/12/2023   S/P hardware removal 03/12/2023   Pharyngitis 12/17/2022   Acute recurrent maxillary sinusitis 12/17/2022   Weakness of both lower extremities 06/26/2022   Localized osteoarthritis of left knee 03/18/2022   Left lateral knee pain 10/11/2021   Partial tear of right hamstring 09/25/2021   Diverticulitis 03/23/2021   Osteoarthritis of knee (Left) 03/05/2021   Prediabetes 02/06/2021   Chronic bursitis of shoulder area (Right) 11/09/2020   Chronic shoulder pain (Right) 11/09/2020   Shoulder blade pain (Right) 11/09/2020   Scoliosis of thoracic spine, unspecified scoliosis type 11/08/2020   Scoliosis of thoracolumbar spine s/p T10-11  fusion (10/20/2017) 11/08/2020   Spondylosis without myelopathy or radiculopathy, cervical region 10/26/2020   Abnormal MRI, cervical spine (08/06/2022) 10/23/2020   Cervical facet syndrome (Bilateral) 10/23/2020   Cervicogenic headache (Bilateral) 10/23/2020   Occipital neuralgia (greater occipital nerve) (Bilateral) 10/23/2020   Cervico-occipital neuralgia (Bilateral) 10/23/2020   Cervical facet hypertrophy (Multilevel) (Bilateral) 10/23/2020   Tear of right gluteus minimus tendon 10/06/2020   Tear of right gluteus medius tendon 10/06/2020   Osteoarthritis of AC (acromioclavicular) joints (Bilateral) 10/05/2020   Chronic Acromioclavicular (AC) joint pain (Bilateral) 10/05/2020   Osteoarthritis of glenohumeral joint (Left) 10/05/2020   Chronic shoulder pain (Bilateral) (R>L) 10/02/2020   Chronic shoulder pain after shoulder replacement (Right) 10/02/2020   Cervicalgia 09/13/2020   Chronic upper extremity pain (Right) 09/13/2020   Cervical spondylosis with radiculopathy (Right) 09/13/2020   Cervical radiculopathy at C7 (Right) 09/13/2020   Atypical chest pain 07/13/2020   Diverticular disease 07/13/2020   Shortness of breath 07/13/2020   Palpitations 07/13/2020   GERD (gastroesophageal reflux disease) 07/13/2020   Insomnia 07/11/2020   Breast cancer 06/22/2020   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) 06/22/2020   DDD (degenerative disc disease), cervical 06/22/2020   DDD (degenerative disc disease), thoracic 06/22/2020   DDD (degenerative disc disease), lumbar 06/22/2020   Displacement of thoracic intervertebral disc (C6-T3, T4-5, T6-7, T8-9) 06/22/2020   Non-traumatic compression fracture of T8 thoracic vertebra, sequela 06/22/2020   Spondylosis without myelopathy or radiculopathy, thoracic region 05/11/2020   Closed wedge compression fracture of T8 vertebra 05/11/2020   Osteoarthritis of hips (Bilateral) 05/11/2020   Abnormal thoracolumbar CT myelogram (01/05/2020)  05/11/2020   Chronic pain syndrome 05/08/2020   Colon cancer screening 05/08/2020   Disorder of skeletal system 05/08/2020   Problems influencing health status 05/08/2020   Chronic lower extremity pain (3ry area of Pain) (Right) 05/08/2020   Chronic knee pain (4th area of Pain) (Posterior) (Left) 05/08/2020   Thoracic facet syndrome (Bilateral) 05/08/2020   Chronic sacroiliac joint pain (Bilateral) 05/08/2020   Osteoporosis 01/14/2020   Chronic low back pain with sciatica 01/14/2020   Chronic thoracic back pain (1ry area of Pain) (Bilateral) (R>L) 01/14/2020   Fusion of spine of lumbosacral region 12/13/2019   Lumbosacral spondylosis with radiculopathy 12/13/2019   S/P LASIK (laser assisted in situ keratomileusis) of both eyes 11/01/2019   Aortic atherosclerosis 09/17/2019   Chronic hip pain (2ry area of Pain) (Bilateral) (R>L) 09/13/2019   Age-related nuclear cataract of right eye 08/19/2019   Age-related nuclear cataract of left eye 08/19/2019   Recurrent incisional hernia with incarceration &  SBO s/p reduction 05/28/2018   Arthritis 01/07/2018   Chronic UTI 12/31/2017   IBS (irritable bowel syndrome) 12/29/2017   History of breast cancer 12/29/2017   Baker's cyst of knee (Left) 12/29/2017   Scoliosis of cervical region due to degenerative disease of spine in adult 09/18/2017   Hypothyroidism 02/15/2013   Hyperlipidemia 02/15/2013   Elevated blood pressure reading with diagnosis of hypertension 02/15/2013   Essential hypertension 02/15/2013    Plan    I have recommended consideration of robotic assisted laparoscopic sigmoid colectomy.  We discussed risks and benefits of the above procedure which include but are not limited to anesthesia, bleeding, infection, anastomotic leak, possible ostomy formation, possible injury to adjacent structures i.e. left ureter, bladder etc.  We discussed bowel prep, the limits of robotic surgery, and potential of open operation.   Extensive  discussion re: medical priorities, and she will get back to [place] after her evaluation/injection re: her right hip.   Face-to-face time spent with the patient and accompanying care providers(if present) was 55 minutes, with more than 50% of the time spent counseling, educating, and coordinating care of the patient.    These notes generated with voice recognition software. I apologize for typographical errors.  Campbell Lerner M.D., FACS 03/18/2023, 3:05 PM

## 2023-03-18 NOTE — Progress Notes (Signed)
Patient ID: Maria Spencer, female   DOB: 1946/02/19, 77 y.o.   MRN: [id number]  Chief Complaint:  Diverticulitis.   History of Present Illness Maria Spencer is a 77 y.o. female with a 5-year history of intermittent diverticulitis, frequently treated, and most recently treated with antibiotics.  Given an ultimatum by her gastroenterologist they would no longer prescribe her antibiotics.  It has been accumulatively worse since an episode where she had a pericolonic abscess, this resolved without percutaneous drainage, however she was hospitalized for antibiotics at that time.  She reports she does continue to have episodes of fevers and chills associated with these attacks.  She reports having painful bowel movements, the pain is located in her lower pelvis left quadrant worse than right, not relieved with bowel movement but made worse by needing to have a bowel movement.  She reports she is very irregular with her bowel activity, has never tolerated fiber well.  Last colonoscopy was 3 years ago.  Most recently her bowel movements have been loose, and overall indicates a smaller caliber of feces.  Her biggest dilemma as concern regarding right hip injury, anticipating an operation which was supposed to happen earlier this month to preserve her right hip.  However this had to be canceled due to another attack of diverticulitis.  She is concerned about this happening again, or even after or during her convalescence.  Last colonoscopy 04/2020.    Past Medical History Past Medical History:  Diagnosis Date  . Arthritis    neck, back, left knee;   . Breast cancer 2009   right lumpectomy s/p radiation x 1 week 2x per day only   . Chicken pox   . Chronic UTI    established with urology   . COVID-19    ? 12/2020 sob  took antiviral, 08/15/21  . Diverticular disease    -osis and -itis   . Diverticulitis   . Esophagitis    egd 05/17/14 see report scanned into chart  . GERD (gastroesophageal reflux  disease)    h/o  . History of kidney stones   . Hormone disorder   . Hyperlipidemia   . Hypertension    controlled well with medication;   . Hypothyroidism   . LVH (left ventricular hypertrophy)    mild  . MR (mitral regurgitation)   . Osteoporosis   . PAC (premature atrial contraction)   . Personal history of radiation therapy 2009   F/U right breast cancer  . PSVT (paroxysmal supraventricular tachycardia)   . PVC (premature ventricular contraction)   . Sepsis   . Thyroid disease    hypothyroidism       Past Surgical History:  Procedure Laterality Date  . BONE GRAFT HIP ILIAC CREST  2018   + cage left hip 10/23/17   . BREAST BIOPSY Left 2009   clip,benign  . BREAST BIOPSY Right 2009   +  . BREAST LUMPECTOMY Right 2009   2009 lumpectomy   . CARDIAC CATHETERIZATION  2001   No stents;  results were WNL Dr. requested it for HTN  . CATARACT EXTRACTION, BILATERAL Bilateral 10/2019  . COLONOSCOPY WITH PROPOFOL N/A 05/15/2020   Procedure: COLONOSCOPY WITH PROPOFOL;  Surgeon: Toney Reil, MD;  Location: [facility];  Service: Gastroenterology;  Laterality: N/A;  . HARDWARE REMOVAL N/A 03/12/2023   Procedure: REMOVAL OF ILIAC SCREWS;  Surgeon: Venetia Night, MD;  Location: [facility];  Service: Neurosurgery;  Laterality: N/A;  . INSERTION OF MESH N/A 05/29/2018  Procedure: INSERTION OF MESH;  Surgeon: Luretha Murphy, MD;  Location: [facility];  Service: General;  Laterality: N/A;  . QUADRICEPS TENDON REPAIR Right 12/05/2020   Procedure: OPEN REPAIR OF RIGHT GLUTEUS MINIMUS TENDON;  Surgeon: Christena Flake, MD;  Location: [facility];  Service: Orthopedics;  Laterality: Right;  . right gluteus medius/minimus repair Dr. Joice Lofts 11/2020      Dr. Joice Lofts  . SHOULDER SURGERY Left 2008, 2009   x2 rotator cuff surgeries left shoulders  . SPINAL FUSION  07/24/2020  . SPINE SURGERY  10/23/2017   spinal 09/2017 h/o scoliosis [facility] L4-S1 OLIF T10 to ililum fusion  . TONSILLECTOMY      age 20 y.o.   . TOTAL KNEE ARTHROPLASTY Left 03/18/2022   Procedure: TOTAL KNEE ARTHROPLASTY;  Surgeon: Joen Laura, MD;  Location: [facility];  Service: Orthopedics;  Laterality: Left;  . TOTAL SHOULDER REPLACEMENT Right 2012  . TUBAL LIGATION     age 74  . VENTRAL HERNIA REPAIR N/A 05/29/2018   Procedure: LAPAROSCOPIC VENTRAL / LATERAL HERNIA REPAIR;  Surgeon: Luretha Murphy, MD;  Location: [facility];  Service: General;  Laterality: N/A;    Allergies  Allergen Reactions  . Sulfa Antibiotics Anaphylaxis, Hives, Itching, Shortness Of Breath and Swelling    Lips & throat swelled  . Nitrofuran Derivatives Swelling    Lip swelling; "eyes get red and puffy"  . Amlodipine Besylate Other (See Comments)    gas, bloating  Heart racing     Current Outpatient Medications  Medication Sig Dispense Refill  . atorvastatin (LIPITOR) 20 MG tablet Take 1 tablet (20 mg total) by mouth daily. (Patient taking differently: Take 20 mg by mouth every other day. AM) 90 tablet 3  . bisacodyl (DULCOLAX) 5 MG EC tablet Take all 4 tabs at 8 am the morning prior to surgery. 4 tablet 0  . Calcium Carb-Cholecalciferol ([employer] CALCIUM 600 & VITAMIN D3 PO) Take 1 tablet by mouth daily.    . cholecalciferol (VITAMIN D) 25 MCG (1000 UNIT) tablet Take 1,000 Units by mouth daily.    . D-MANNOSE PO Take 1,300 mg by mouth daily.    Marland Kitchen levothyroxine (SYNTHROID) 88 MCG tablet Take 1 tablet (88 mcg total) by mouth daily before breakfast. 30 minutes 90 tablet 3  . methocarbamol (ROBAXIN) 500 MG tablet Take 1 tablet (500 mg total) by mouth every 6 (six) hours as needed for muscle spasms. 120 tablet 0  . metoprolol succinate (TOPROL-XL) 50 MG 24 hr tablet TAKE 1 TABLET (50 MG TOTAL) BY MOUTH TWICE A DAY .TAKE WITH OR IMMEDIATELY FOLLOWING A MEAL 180 tablet 0  . metroNIDAZOLE (FLAGYL) 500 MG tablet Take 2 tabs (1000 mg total) at 8 am, again at 2 pm, and again at 8 pm the day prior to surgery. 6 tablet 0  . Multiple Vitamin  (MULTI-VITAMINS) TABS Take 1 tablet by mouth daily.    Marland Kitchen neomycin (MYCIFRADIN) 500 MG tablet Take 2 tabs (1000 mg total) at 8 am, again at 2 pm, and again at 8 pm the day prior to surgery. 6 tablet 0  . polyethylene glycol powder (MIRALAX) 17 GM/SCOOP powder Mix with 64 ounces of Gatorade (no red) the day prior to surgery and drink as directed. 238 g 0  . senna (SENOKOT) 8.6 MG TABS tablet Take 1 tablet (8.6 mg total) by mouth daily as needed. 30 tablet 0  . telmisartan (MICARDIS) 80 MG tablet Take 1 tablet (80 mg total) by mouth daily. (Patient taking differently:  Take 80 mg by mouth every morning.) 90 tablet 3  . vitamin B-12 (CYANOCOBALAMIN) 1000 MCG tablet Take 1,000 mcg by mouth daily.     No current facility-administered medications for this visit.    Family History Family History  Problem Relation Age of Onset  . Arthritis Mother   . Heart disease Mother 91       stent   . Hyperlipidemia Mother   . Hypertension Mother   . Coronary artery disease Mother 59  . Arthritis Father   . Diabetes Father   . Cancer Father        colon  . AAA (abdominal aortic aneurysm) Father   . Arthritis Sister   . Hyperlipidemia Sister   . Hypertension Sister   . Cancer Brother        lung, smoker  . Mental retardation Brother   . Drug abuse Daughter        overdose in 2018   . Arthritis Sister   . Hyperlipidemia Sister   . Bladder Cancer Neg Hx   . Kidney cancer Neg Hx   . Breast cancer Neg Hx       Social History Social History   Tobacco Use  . Smoking status: Never    Passive exposure: Never  . Smokeless tobacco: Never  Vaping Use  . Vaping Use: Never used  Substance Use Topics  . Alcohol use: Not Currently    Comment: rare  . Drug use: No        Review of Systems  Constitutional:  Positive for chills and fever.  HENT: Negative.    Eyes: Negative.   Respiratory: Negative.    Cardiovascular: Negative.   Gastrointestinal:  Positive for abdominal pain and diarrhea.  Negative for blood in stool, melena, nausea and vomiting.  Genitourinary: Negative.   Skin: Negative.   Neurological: Negative.   Psychiatric/Behavioral: Negative.       Physical Exam Blood pressure (!) 174/78, pulse 90, temperature 98.1 F (36.7 C), temperature source Oral, height  (1.499 m), weight 135 lb (61.2 kg), SpO2 99 %. Last Weight  Most recent update: 03/18/2023  2:00 PM    Weight  61.2 kg (135 lb)             CONSTITUTIONAL: Well developed, and nourished, appropriately responsive and aware without distress.   EYES: Sclera non-icteric.   EARS, NOSE, MOUTH AND THROAT:  The oropharynx is clear. Oral mucosa is pink and moist.   Hearing is intact to voice.  NECK: Trachea is midline, and there is no jugular venous distension.  LYMPH NODES:  Lymph nodes in the neck are not appreciated. RESPIRATORY:  Lungs are clear, and breath sounds are equal bilaterally.  Normal respiratory effort without pathologic use of accessory muscles. CARDIOVASCULAR: Heart is regular in rate and rhythm.   Well perfused.  GI: The abdomen is  soft, nontender, and nondistended. There were no palpable masses.  I did not appreciate hepatosplenomegaly.   There is a left lower quadrant scar, and no appreciable fascial defect hernia along the midline as noted on her CT scan.  MUSCULOSKELETAL:  Symmetrical muscle tone appreciated in all four extremities.    SKIN: Skin turgor is normal. No pathologic skin lesions appreciated.  NEUROLOGIC:  Motor and sensation appear grossly normal.  Cranial nerves are grossly without defect. PSYCH:  Alert and oriented to person, place and time. Affect is appropriate for situation.  Data Reviewed I have personally reviewed what is currently available of the  patient's imaging, recent labs and medical records.   Labs:     Latest Ref Rng & Units 03/06/2023   10:52 AM 05/23/2022   11:15 AM 03/19/2022    3:16 AM  CBC  WBC 4.0 - 10.5 K/uL 5.5  5.5  8.2   Hemoglobin 12.0 -  15.0 g/dL 84.6  96.2  95.2   Hematocrit 36.0 - 46.0 % 41.5  41.4  35.7   Platelets 150 - 400 K/uL 325  227.0  190       Latest Ref Rng & Units 03/06/2023   10:52 AM 03/19/2022    3:16 AM 03/11/2022    9:17 AM  CMP  Glucose 70 - 99 mg/dL 841  324  99   BUN 8 - 23 mg/dL Creatinine 0.44 - 1.00 mg/dL 4.01  0.27  2.53   Sodium 135 - 145 mmol/L 138  136  139   Potassium 3.5 - 5.1 mmol/L 3.6  3.9  3.6   Chloride 98 - 111 mmol/L 102  104  105   CO2 22 - 32 mmol/L Calcium 8.9 - 10.3 mg/dL 8.9  8.4  9.1   Total Protein 6.5 - 8.1 g/dL   7.1   Total Bilirubin 0.3 - 1.2 mg/dL   0.6   Alkaline Phos 38 - 126 U/L   72   AST 15 - 41 U/L   19   ALT 0 - 44 U/L   15     Imaging: Radiological images reviewed:  CLINICAL DATA:  Left lower quadrant abdominal pain with cramping and diarrhea for the past few weeks. History of diverticulitis.   EXAM: CT ABDOMEN AND PELVIS WITH CONTRAST   TECHNIQUE: Multidetector CT imaging of the abdomen and pelvis was performed using the standard protocol following bolus administration of intravenous contrast.   RADIATION DOSE REDUCTION: This exam was performed according to the departmental dose-optimization program which includes automated exposure control, adjustment of the mA and/or kV according to patient size and/or use of iterative reconstruction technique.   CONTRAST:  OMNIPAQUE IOHEXOL 300 MG/ML  SOLN   COMPARISON:  CT abdomen pelvis dated March 19, 2021.   FINDINGS: Lower chest: No acute abnormality. Unchanged small right fat containing Bochdalek hernia.   Hepatobiliary: Unchanged subcentimeter cyst in the superior left liver. No other focal liver abnormality. The gallbladder is unremarkable. No biliary dilatation.   Pancreas: Unremarkable. No pancreatic ductal dilatation or surrounding inflammatory changes.   Spleen: Normal in size without focal abnormality.   Adrenals/Urinary Tract: Adrenal glands are  unremarkable. Kidneys are normal, without renal calculi, focal lesion, or hydronephrosis. Bladder is unremarkable.   Stomach/Bowel: The stomach is within normal limits. Small bowel is unremarkable. Extensive left-sided colonic diverticulosis again noted with severe wall thickening of the distal sigmoid colon and mild surrounding inflammatory change. Normal appendix.   Vascular/Lymphatic: Aortic atherosclerosis. No enlarged abdominal or pelvic lymph nodes.   Reproductive: Uterus and bilateral adnexa are unremarkable.   Other: Unchanged fat containing umbilical and infraumbilical hernias. Similar laxity of the lower left abdominal wall. No fluid collection or pneumoperitoneum.   Musculoskeletal: No acute or significant osseous findings. Extensive thoracolumbar fusion to the pelvis again noted.   IMPRESSION: 1. Acute sigmoid diverticulitis. No perforation or abscess. 2. Aortic Atherosclerosis (ICD10-I70.0).     Electronically Signed   By: Obie Dredge M.D.   On: 02/12/2022 13:18  I also have a report from [facility]  orthopedic specialist of a pelvic MRI which suggest that there is sigmoid colonic thickening, this is dated December/04/2022.   Within last 24 hrs: No results found.  Assessment    Sigmoid diverticulitis. Patient Active Problem List   Diagnosis Date Noted  . Failed orthopedic implant 03/12/2023  . S/P spinal fusion 03/12/2023  . S/P hardware removal 03/12/2023  . Pharyngitis 12/17/2022  . Acute recurrent maxillary sinusitis 12/17/2022  . Weakness of both lower extremities 06/26/2022  . Localized osteoarthritis of left knee 03/18/2022  . Left lateral knee pain 10/11/2021  . Partial tear of right hamstring 09/25/2021  . Diverticulitis 03/23/2021  . Osteoarthritis of knee (Left) 03/05/2021  . Prediabetes 02/06/2021  . Chronic bursitis of shoulder area (Right) 11/09/2020  . Chronic shoulder pain (Right) 11/09/2020  . Shoulder blade pain (Right)  11/09/2020  . Scoliosis of thoracic spine, unspecified scoliosis type 11/08/2020  . 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/20/2017) 11/08/2020  . Spondylosis without myelopathy or radiculopathy, cervical region 10/26/2020  . Abnormal MRI, cervical spine (08/06/2022) 10/23/2020  . Cervical facet syndrome (Bilateral) 10/23/2020  . Cervicogenic headache (Bilateral) 10/23/2020  . Occipital neuralgia (greater occipital nerve) (Bilateral) 10/23/2020  . Cervico-occipital neuralgia (Bilateral) 10/23/2020  . Cervical facet hypertrophy (Multilevel) (Bilateral) 10/23/2020  . Tear of right gluteus minimus tendon 10/06/2020  . Tear of right gluteus medius tendon 10/06/2020  . Osteoarthritis of AC (acromioclavicular) joints (Bilateral) 10/05/2020  . Chronic Acromioclavicular (AC) joint pain (Bilateral) 10/05/2020  . Osteoarthritis of glenohumeral joint (Left) 10/05/2020  . Chronic shoulder pain (Bilateral) (R>L) 10/02/2020  . Chronic shoulder pain after shoulder replacement (Right) 10/02/2020  . Cervicalgia 09/13/2020  . Chronic upper extremity pain (Right) 09/13/2020  . Cervical spondylosis with radiculopathy (Right) 09/13/2020  . Cervical radiculopathy at C7 (Right) 09/13/2020  . Atypical chest pain 07/13/2020  . 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 Shortness of breath 07/13/2020  . Palpitations 07/13/2020  . GERD (gastroesophageal reflux disease) 07/13/2020  . Insomnia 07/11/2020  . Breast cancer 06/22/2020  . 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) 06/22/2020  . DDD (degenerative disc disease), cervical 06/22/2020  . DDD (degenerative disc disease), thoracic 06/22/2020  . DDD (degenerative disc disease), lumbar 06/22/2020  . Displacement of thoracic intervertebral disc (C6-T3, T4-5, T6-7, T8-9) 06/22/2020  . Non-traumatic compression fracture of T8 thoracic vertebra, sequela 06/22/2020  . Spondylosis without myelopathy or radiculopathy, thoracic region 05/11/2020  . Closed  wedge compression fracture of T8 vertebra 05/11/2020  . Osteoarthritis of hips (Bilateral) 05/11/2020  . Abnormal thoracolumbar CT myelogram (01/05/2020) 05/11/2020  . Chronic pain syndrome 05/08/2020  . Colon cancer screening 05/08/2020  . Disorder of skeletal system 05/08/2020  . Problems influencing health status 05/08/2020  . Chronic lower extremity pain (3ry area of Pain) (Right) 05/08/2020  . Chronic knee pain (4th area of Pain) (Posterior) (Left) 05/08/2020  . Thoracic facet syndrome (Bilateral) 05/08/2020  . Chronic sacroiliac joint pain (Bilateral) 05/08/2020  . Osteoporosis 01/14/2020  . Chronic low back pain with sciatica 01/14/2020  . Chronic thoracic back pain (1ry area of Pain) (Bilateral) (R>L) 01/14/2020  . Fusion of spine of lumbosacral region 12/13/2019  . Lumbosacral spondylosis with radiculopathy 12/13/2019  . S/P LASIK (laser assisted in situ keratomileusis) of both eyes 11/01/2019  . Aortic atherosclerosis 09/17/2019  . Chronic hip pain (2ry area of Pain) (Bilateral) (R>L) 09/13/2019  . Age-related nuclear cataract of right eye 08/19/2019  . Age-related nuclear cataract of left eye 08/19/2019  . Recurrent incisional hernia with incarceration &  SBO s/p reduction 05/28/2018  . Arthritis 01/07/2018  . Chronic UTI 12/31/2017  . IBS (irritable bowel syndrome) 12/29/2017  . History of breast cancer 12/29/2017  . Baker's cyst of knee (Left) 12/29/2017  . Scoliosis of cervical region due to degenerative disease of spine in adult 09/18/2017  . Hypothyroidism 02/15/2013  . Hyperlipidemia 02/15/2013  . Elevated blood pressure reading with diagnosis of hypertension 02/15/2013  . Essential hypertension 02/15/2013    Plan    I have recommended consideration of robotic assisted laparoscopic sigmoid colectomy.  We discussed risks and benefits of the above procedure which include but are not limited to anesthesia, bleeding, infection, anastomotic leak, possible ostomy  formation, possible injury to adjacent structures i.e. left ureter, bladder etc.  Face-to-face time spent with the patient and accompanying care providers(if present) was *** minutes, with more than 50% of the time spent counseling, educating, and coordinating care of the patient.    These notes generated with voice recognition software. I apologize for typographical errors.  Campbell Lerner M.D., FACS 03/18/2023, 3:05 PM

## 2023-03-19 ENCOUNTER — Ambulatory Visit: Payer: Medicare Other | Admitting: Surgery

## 2023-03-19 ENCOUNTER — Telehealth: Payer: Self-pay | Admitting: Neurosurgery

## 2023-03-19 ENCOUNTER — Telehealth: Payer: Self-pay | Admitting: Surgery

## 2023-03-19 DIAGNOSIS — M1611 Unilateral primary osteoarthritis, right hip: Secondary | ICD-10-CM | POA: Diagnosis not present

## 2023-03-19 NOTE — Telephone Encounter (Signed)
Patient has been advised of Pre-Admission date/time, and Surgery date at [facility].  Surgery Date: 04/16/23 Preadmission Testing Date: 04/08/23 (phone 8a-1p)  Patient has been made aware to call [phone], between 1-3:00pm the day before surgery, to find out what time to arrive for surgery.

## 2023-03-19 NOTE — Telephone Encounter (Signed)
Patient agreed with Dr.Yarbrough's recommendation, she will postpone her surgery.

## 2023-03-19 NOTE — Telephone Encounter (Signed)
Patient cancelled surgery for robotic sigmoid colectomy on 03/26/23.  She states just had spinal surgery last week, this is just too soon for her.  She wants to await until she gets clearance from her spinal surgeon before rescheduling.

## 2023-03-19 NOTE — Telephone Encounter (Signed)
removal of iliac screws on 03/12/23  She is scheduled with Dr.Rodenburg for surgery to remove a piece of her colon due to diverticulitis on 03/26/2023. Should she postpone this surgery since she just had surgery with you last week?

## 2023-03-20 ENCOUNTER — Inpatient Hospital Stay: Admission: RE | Admit: 2023-03-20 | Payer: Medicare Other | Source: Ambulatory Visit

## 2023-03-21 ENCOUNTER — Telehealth: Payer: Self-pay | Admitting: Internal Medicine

## 2023-03-21 NOTE — Telephone Encounter (Signed)
Appointment rescheduled. Patient states she will not have transportation so she needs a telephone visit. She had no further questions at this time. Will update provider.     Patient Consent for Virtual Visit    Maria Spencer, you are scheduled for a virtual visit with your provider today.  Just as we do with appointments in the office, we must obtain your consent to participate.  Your consent will be active for this visit and any virtual visit you may have with one of our providers in the next 365 days.  If you have a MyChart account, I can also send a copy of this consent to you electronically.  All virtual visits are billed to your insurance company just like a traditional visit in the office.  As this is a virtual visit, video technology does not allow for your provider to perform a traditional examination.  This may limit your provider's ability to fully assess your condition.  If your provider identifies any concerns that need to be evaluated in person or the need to arrange testing such as labs, EKG, etc, we will make arrangements to do so.  Although advances in technology are sophisticated, we cannot ensure that it will always work on either your end or our end.  If the connection with a video visit is poor, we may have to switch to a telephone visit.  With either a video or telephone visit, we are not always able to ensure that we have a secure connection.   I need to obtain your verbal consent now.   Are you willing to proceed with your visit today?    Maria Spencer has provided verbal consent on 03/21/2023 for a virtual visit (video or telephone).   CONSENT FOR VIRTUAL VISIT FOR:  Maria Spencer  By participating in this virtual visit I agree to the following:  I hereby voluntarily request, consent and authorize [facility] and its employed or contracted physicians, physician assistants, nurse practitioners or other licensed health care professionals (the Practitioner), to provide me  with telemedicine health care services (the "Services") as deemed necessary by the treating Practitioner. I acknowledge and consent to receive the Services by the Practitioner via telemedicine. I understand that the telemedicine visit will involve communicating with the Practitioner through live audiovisual communication technology and the disclosure of certain medical information by electronic transmission. I acknowledge that I have been given the opportunity to request an in-person assessment or other available alternative prior to the telemedicine visit and am voluntarily participating in the telemedicine visit.  I understand that I have the right to withhold or withdraw my consent to the use of telemedicine in the course of my care at any time, without affecting my right to future care or treatment, and that the Practitioner or I may terminate the telemedicine visit at any time. I understand that I have the right to inspect all information obtained and/or recorded in the course of the telemedicine visit and may receive copies of available information for a reasonable fee.  I understand that some of the potential risks of receiving the Services via telemedicine include:  Delay or interruption in medical evaluation due to technological equipment failure or disruption; Information transmitted may not be sufficient (e.g. poor resolution of images) to allow for appropriate medical decision making by the Practitioner; and/or  In rare instances, security protocols could fail, causing a breach of personal health information.  Furthermore, I acknowledge that it is my responsibility to provide information about my  medical history, conditions and care that is complete and accurate to the best of my ability. I acknowledge that Practitioner's advice, recommendations, and/or decision may be based on factors not within their control, such as incomplete or inaccurate data provided by me or distortions of diagnostic  images or specimens that may result from electronic transmissions. I understand that the practice of medicine is not an exact science and that Practitioner makes no warranties or guarantees regarding treatment outcomes. I acknowledge that a copy of this consent can be made available to me via my patient portal [facility] MyChart), or I can request a printed copy by calling the office of [facility].    I understand that my insurance will be billed for this visit.   I have read or had this consent read to me. I understand the contents of this consent, which adequately explains the benefits and risks of the Services being provided via telemedicine.  I have been provided ample opportunity to ask questions regarding this consent and the Services and have had my questions answered to my satisfaction. I give my informed consent for the services to be provided through the use of telemedicine in my medical care

## 2023-03-21 NOTE — Telephone Encounter (Signed)
She can change the appointment to a later date once she is home and recovering unless she is having issues or concerns. That one can be telehealth visit if needed.

## 2023-03-21 NOTE — Telephone Encounter (Signed)
Pt is calling regarding her appt on 5/28. She states she is having surgery on 5/22, and will be in the hospital 3-6 days post surgery, and will not be able to drive. She would like to know if Lavonna Rua would be ok doing a telephone visit or a mychart video visit that day for her. Please advise.

## 2023-03-24 ENCOUNTER — Other Ambulatory Visit: Payer: Self-pay

## 2023-03-24 DIAGNOSIS — M1711 Unilateral primary osteoarthritis, right knee: Secondary | ICD-10-CM | POA: Diagnosis not present

## 2023-03-24 DIAGNOSIS — M1611 Unilateral primary osteoarthritis, right hip: Secondary | ICD-10-CM | POA: Diagnosis not present

## 2023-03-25 ENCOUNTER — Ambulatory Visit
Admission: RE | Admit: 2023-03-25 | Discharge: 2023-03-25 | Disposition: A | Discharge status: Home / Self Care | Payer: Medicare Other | Source: Ambulatory Visit | Attending: Pain Medicine | Admitting: Pain Medicine

## 2023-03-25 ENCOUNTER — Encounter: Payer: Self-pay | Admitting: Pain Medicine

## 2023-03-25 ENCOUNTER — Ambulatory Visit: Payer: Medicare Other | Attending: Pain Medicine | Admitting: Pain Medicine

## 2023-03-25 VITALS — BP 147/72 | HR 68 | Temp 96.8°F | Resp 18 | Ht 59.0 in | Wt 135.0 lb

## 2023-03-25 DIAGNOSIS — M47812 Spondylosis without myelopathy or radiculopathy, cervical region: Secondary | ICD-10-CM | POA: Diagnosis not present

## 2023-03-25 DIAGNOSIS — M542 Cervicalgia: Secondary | ICD-10-CM | POA: Insufficient documentation

## 2023-03-25 DIAGNOSIS — M5030 Other cervical disc degeneration, unspecified cervical region: Secondary | ICD-10-CM

## 2023-03-25 MED ORDER — PENTAFLUOROPROP-TETRAFLUOROETH EX AERO
INHALATION_SPRAY | Freq: Once | CUTANEOUS | Status: AC
  Administered 2023-03-25: 30 via TOPICAL
  Filled 2023-03-25: qty 116

## 2023-03-25 MED ORDER — LIDOCAINE HCL 2 % IJ SOLN
20.0000 mL | Freq: Once | INTRAMUSCULAR | Status: AC
  Administered 2023-03-25: 400 mg
  Filled 2023-03-25: qty 20

## 2023-03-25 MED ORDER — ROPIVACAINE HCL 2 MG/ML IJ SOLN
9.0000 mL | Freq: Once | INTRAMUSCULAR | Status: AC
  Administered 2023-03-25: 9 mL via PERINEURAL
  Filled 2023-03-25: qty 20

## 2023-03-25 MED ORDER — DEXAMETHASONE SODIUM PHOSPHATE 10 MG/ML IJ SOLN
10.0000 mg | Freq: Once | INTRAMUSCULAR | Status: AC
  Administered 2023-03-25: 10 mg
  Filled 2023-03-25: qty 1

## 2023-03-25 NOTE — Progress Notes (Signed)
PROVIDER NOTE: Interpretation of information contained herein should be left to medically-trained personnel. Specific patient instructions are provided elsewhere under "Patient Instructions" section of medical record. This document was created in part using STT-dictation technology, any transcriptional errors that may result from this process are unintentional.  Patient: Maria Spencer Type: Established DOB: 09-20-1946 MRN: [id number] PCP: Dana Allan, MD  Service: Procedure DOS: 03/25/2023 Setting: Ambulatory Location: Ambulatory outpatient facility Delivery: Face-to-face Provider: Oswaldo Done, MD Specialty: Interventional Pain Management Specialty designation: 09 Location: Outpatient facility Ref. Prov.: Dana Allan, MD       Interventional Therapy   Procedure: Cervical Facet Medial Branch Block(s) #2  Laterality: Right  Level: C3, C4, C5, and C6 Medial Branch Level(s). Injecting these levels blocks the C3-4, C4-5, and C5-6 cervical facet joints.  Imaging: Fluoroscopic guidance Anesthesia: Local anesthesia (1-2% Lidocaine) Anxiolysis: None                 Sedation: No Sedation                       DOS: 03/25/2023  Performed by: Oswaldo Done, MD  Purpose: Diagnostic/Therapeutic Indications: Cervicalgia (cervical spine axial pain) severe enough to impact quality of life or function. 1. Cervical facet syndrome (Bilateral)   2. Cervical facet hypertrophy (Multilevel) (Bilateral)   3. Cervical facet joint pain   4. Spondylosis without myelopathy or radiculopathy, cervical region   5. Cervicalgia   6. DDD (degenerative disc disease), cervical    NAS-11 Pain score:   Pre-procedure: 6 /10   Post-procedure: 0-No pain/10     Position / Prep / Materials:  Position: Prone. Head in cradle. C-spine slightly flexed. Prep solution: DuraPrep (Iodine Povacrylex [0.7% available iodine] and Isopropyl Alcohol, 74% w/w) Prep Area: Posterior Cervico-thoracic Region. From  occipital ridge to tip of scapula, and from shoulder to shoulder. Entire posterior and lateral neck surface. Materials:  Tray: Block Needle(s):  Type: Spinal  Gauge (G): 22  Length: 3.5-in  Qty:  4     Pre-op H&P Assessment:  [name] is a 77 y.o. (year old), female patient, seen today for interventional treatment. She  has a past surgical history that includes Total shoulder replacement (Right, 2012); Shoulder surgery (Left, 2008, 2009); Tonsillectomy; Bone graft hip iliac crest (2018); Spine surgery (10/23/2017); Ventral hernia repair (N/A, 05/29/2018); Insertion of mesh (N/A, 05/29/2018); Cardiac catheterization (2001); Breast lumpectomy (Right, 2009); Breast biopsy (Left, 2009); Breast biopsy (Right, 2009); Colonoscopy with propofol (N/A, 05/15/2020); Spinal fusion (07/24/2020); Cataract extraction, bilateral (Bilateral, 10/2019); Tubal ligation; Quadriceps tendon repair (Right, 12/05/2020); right gluteus medius/minimus repair Dr. 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 [name] has a current medication list which includes the following prescription(s): atorvastatin, bisacodyl, calcium carb-cholecalciferol, cholecalciferol, d-mannose, levothyroxine, methocarbamol, metoprolol succinate, metronidazole, multi-vitamins, neomycin, polyethylene glycol powder, telmisartan, and cyanocobalamin. Her primarily concern today is the Neck Pain  Initial Vital Signs:  Pulse/HCG Rate: 74  Temp: (!) 96.8 F (36 C) Resp: 16 BP: (!) 150/62 SpO2: 98 %  BMI: Estimated body mass index is 27.27 kg/m as calculated from the following:   Height as of this encounter: 4\' 11"  (1.499 m).   Weight as of this encounter: 135 lb (61.2 kg).  Risk Assessment: Allergies: Reviewed. She is allergic to sulfa antibiotics, nitrofuran derivatives, and amlodipine besylate.  Allergy Precautions: None required Coagulopathies: Reviewed. None identified.  Blood-thinner  therapy: None at this time Active Infection(s): Reviewed. None identified. [name] is  afebrile  Site Confirmation: [name] was asked to confirm the procedure and laterality before marking the site Procedure checklist: Completed Consent: Before the procedure and under the influence of no sedative(s), amnesic(s), or anxiolytics, the patient was informed of the treatment options, risks and possible complications. To fulfill our ethical and legal obligations, as recommended by the American Medical Association's Code of Ethics, I have informed the patient of my clinical impression; the nature and purpose of the treatment or procedure; the risks, benefits, and possible complications of the intervention; the alternatives, including doing nothing; the risk(s) and benefit(s) of the alternative treatment(s) or procedure(s); and the risk(s) and benefit(s) of doing nothing. The patient was provided information about the general risks and possible complications associated with the procedure. These may include, but are not limited to: failure to achieve desired goals, infection, bleeding, organ or nerve damage, allergic reactions, paralysis, and death. In addition, the patient was informed of those risks and complications associated to Spine-related procedures, such as failure to decrease pain; infection (i.e.: Meningitis, epidural or intraspinal abscess); bleeding (i.e.: epidural hematoma, subarachnoid hemorrhage, or any other type of intraspinal or peri-dural bleeding); organ or nerve damage (i.e.: Any type of peripheral nerve, nerve root, or spinal cord injury) with subsequent damage to sensory, motor, and/or autonomic systems, resulting in permanent pain, numbness, and/or weakness of one or several areas of the body; allergic reactions; (i.e.: anaphylactic reaction); and/or death. Furthermore, the patient was informed of those risks and complications associated with the medications. These include, but are not  limited to: allergic reactions (i.e.: anaphylactic or anaphylactoid reaction(s)); adrenal axis suppression; blood sugar elevation that in diabetics may result in ketoacidosis or comma; water retention that in patients with history of congestive heart failure may result in shortness of breath, pulmonary edema, and decompensation with resultant heart failure; weight gain; swelling or edema; medication-induced neural toxicity; particulate matter embolism and blood vessel occlusion with resultant organ, and/or nervous system infarction; and/or aseptic necrosis of one or more joints. Finally, the patient was informed that Medicine is not an exact science; therefore, there is also the possibility of unforeseen or unpredictable risks and/or possible complications that may result in a catastrophic outcome. The patient indicated having understood very clearly. We have given the patient no guarantees and we have made no promises. Enough time was given to the patient to ask questions, all of which were answered to the patient's satisfaction. Ms. Nicoletti has indicated that she wanted to continue with the procedure. Attestation: I, the ordering provider, attest that I have discussed with the patient the benefits, risks, side-effects, alternatives, likelihood of achieving goals, and potential problems during recovery for the procedure that I have provided informed consent. Date  Time: 03/25/2023  9:55 AM   Pre-Procedure Preparation:  Monitoring: As per clinic protocol. Respiration, ETCO2, SpO2, BP, heart rate and rhythm monitor placed and checked for adequate function Safety Precautions: Patient was assessed for positional comfort and pressure points before starting the procedure. Time-out: I initiated and conducted the "Time-out" before starting the procedure, as per protocol. The patient was asked to participate by confirming the accuracy of the "Time Out" information. Verification of the correct person, site, and  procedure were performed and confirmed by me, the nursing staff, and the patient. "Time-out" conducted as per Joint Commission's Universal Protocol (UP.01.01.01). Time: 1059 Start Time: 1059 hrs.  Description/Narrative of Procedure:          Laterality: See above. Targeted Levels: See above.  Rationale (medical necessity): procedure  needed and proper for the diagnosis and/or treatment of the patient's medical symptoms and needs. Procedural Technique Safety Precautions: Aspiration looking for blood return was conducted prior to all injections. At no point did we inject any substances, as a needle was being advanced. No attempts were made at seeking any paresthesias. Safe injection practices and needle disposal techniques used. Medications properly checked for expiration dates. SDV (single dose vial) medications used. Description of the Procedure: Protocol guidelines were followed. The patient was assisted into a comfortable position. The target area was identified and the area prepped in the usual manner. Skin & deeper tissues infiltrated with local anesthetic. Appropriate amount of time allowed to pass for local anesthetics to take effect. The procedure needles were then advanced to the target area. Proper needle placement secured. Negative aspiration confirmed. Solution injected in intermittent fashion, asking for systemic symptoms every 0.5cc of injectate. The needles were then removed and the area cleansed, making sure to leave some of the prepping solution back to take advantage of its long term bactericidal properties.  Technical description of process:   C3 Medial Branch Nerve Block (MBB): The target area for the C3 dorsal medial articular branch is the lateral concave waist of the articular pillar of C3. Under fluoroscopic guidance, a Quincke needle was inserted until contact was made with os over the postero-lateral aspect of the articular pillar of C3 (target area). After negative aspiration  for blood, 0.5 mL of the nerve block solution was injected without difficulty or complication. The needle was removed intact. C4 Medial Branch Nerve Block (MBB): The target area for the C4 dorsal medial articular branch is the lateral concave waist of the articular pillar of C4. Under fluoroscopic guidance, a Quincke needle was inserted until contact was made with os over the postero-lateral aspect of the articular pillar of C4 (target area). After negative aspiration for blood, 0.5 mL of the nerve block solution was injected without difficulty or complication. The needle was removed intact. C5 Medial Branch Nerve Block (MBB): The target area for the C5 dorsal medial articular branch is the lateral concave waist of the articular pillar of C5. Under fluoroscopic guidance, a Quincke needle was inserted until contact was made with os over the postero-lateral aspect of the articular pillar of C5 (target area). After negative aspiration for blood, 0.5 mL of the nerve block solution was injected without difficulty or complication. The needle was removed intact. C6 Medial Branch Nerve Block (MBB): The target area for the C6 dorsal medial articular branch is the lateral concave waist of the articular pillar of C6. Under fluoroscopic guidance, a Quincke needle was inserted until contact was made with os over the postero-lateral aspect of the articular pillar of C6 (target area). After negative aspiration for blood, 0.5 mL of the nerve block solution was injected without difficulty or complication. The needle was removed intact.  Once the entire procedure was completed, the treated area was cleaned, making sure to leave some of the prepping solution back to take advantage of its long term bactericidal properties.  Anatomy Reference Guide:      Facet Joint Innervation  C1-2 Third occipital Nerve (TON)  C2-3 TON, C3  Medial Branch  C3-4 C3, C4         "          "  C4-5 C4, C5         "          "  C5-6 C5, C6          "          "  C6-7 C6, C7         "          "  C7-T1 C7, C8         "          "    Vitals:   03/25/23 0954 03/25/23 1055 03/25/23 1105 03/25/23 1110  BP: (!) 150/62 103/77  (!) 147/72  Pulse: 74 69 70 68  Resp: 16 16 16 18   Temp: (!) 96.8 F (36 C)     SpO2: 98% 100% 100% 100%  Weight: 135 lb (61.2 kg)     Height: 4\' 11"  (1.499 m)        Start Time: 1059 hrs. End Time: 1104 hrs.  Imaging Guidance (Spinal):          Type of Imaging Technique: Fluoroscopy Guidance (Spinal) Indication(s): Assistance in needle guidance and placement for procedures requiring needle placement in or near specific anatomical locations not easily accessible without such assistance. Exposure Time: Please see nurses notes. Contrast: None used. Fluoroscopic Guidance: I was personally present during the use of fluoroscopy. "Tunnel Vision Technique" used to obtain the best possible view of the target area. Parallax error corrected before commencing the procedure. "Direction-depth-direction" technique used to introduce the needle under continuous pulsed fluoroscopy. Once target was reached, antero-posterior, oblique, and lateral fluoroscopic projection used confirm needle placement in all planes. Images permanently stored in EMR.    Interpretation: No contrast injected. I personally interpreted the imaging intraoperatively. Adequate needle placement confirmed in multiple planes. Permanent images saved into the patient's record.  Post-operative Assessment:  Post-procedure Vital Signs:  Pulse/HCG Rate: 68  Temp: (!) 96.8 F (36 C) Resp: 18 BP: (!) 147/72 SpO2: 100 %  EBL: None  Complications: No immediate post-treatment complications observed by team, or reported by patient.  Note: The patient tolerated the entire procedure well. A repeat set of vitals were taken after the procedure and the patient was kept under observation following institutional policy, for this type of procedure. Post-procedural  neurological assessment was performed, showing return to baseline, prior to discharge. The patient was provided with post-procedure discharge instructions, including a section on how to identify potential problems. Should any problems arise concerning this procedure, the patient was given instructions to immediately contact us, at any time, without hesitation. In any case, we plan to contact the patient by telephone for a follow-up status report regarding this interventional procedure.  Comments:  No additional relevant information.  Plan of Care (POC)  Orders:  Orders Placed This Encounter  Procedures   CERVICAL FACET (MEDIAL BRANCH NERVE BLOCK)     Scheduling Instructions:     Side: Right-sided     Level: C3-4, C4-5, C5-6, C6-7, and TBD Facet joints (C3, C4, C5, C6, C7, and TBD Medial Branch Nerves)     Sedation: Patient's choice.     Timeframe: Today    Order Specific Question:   Where will this procedure be performed?    Answer:   [facility]   DG PAIN [facility] C-ARM 1-60 MIN NO REPORT    Intraoperative interpretation by procedural physician at [facility].    Standing Status:   Standing    Number of Occurrences:   1    Order Specific Question:   Reason for exam:    Answer:   Assistance in needle guidance and placement for procedures requiring needle placement in or near specific anatomical locations not easily accessible without such assistance.   Informed Consent  Details: Physician/Practitioner Attestation; Transcribe to consent form and obtain patient signature    Note: Always confirm laterality of pain with Ms. Droege, before procedure. Transcribe to consent form and obtain patient signature.    Order Specific Question:   Physician/Practitioner attestation of informed consent for procedure/surgical case    Answer:   I, the physician/practitioner, attest that I have discussed with the patient the benefits, risks, side effects, alternatives, likelihood of achieving  goals and potential problems during recovery for the procedure that I have provided informed consent.    Order Specific Question:   Procedure    Answer:   Right Cervical facet block under fluoroscopic guidance.    Order Specific Question:   Physician/Practitioner performing the procedure    Answer:   Livingston Denner A. 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 Highest recorded MME/day: 75 mg/day MME/day: 0 mg/day   Medications ordered for procedure: Meds ordered this encounter  Medications   lidocaine (XYLOCAINE) 2 % (with pres) injection 400 mg   pentafluoroprop-tetrafluoroeth (GEBAUERS) aerosol   ropivacaine (PF) 2 mg/mL (0.2%) (NAROPIN) injection 9 mL   dexamethasone (DECADRON) injection 10 mg   Medications administered: We administered lidocaine, pentafluoroprop-tetrafluoroeth, ropivacaine (PF) 2 mg/mL (0.2%), and dexamethasone.  See the medical record for exact dosing, route, and time of administration.  Follow-up plan:   Return in about 2 weeks (around 04/08/2023) for Proc-day (T,Th), (Face2F), (PPE).       Interventional Therapies  Risk Factors  Considerations:   Aortic atherosclerosis  osteoporosis  HTN  GERD  palpitations  prediabetes  SOB  Hx. of breast cancer   Planned  Pending:   Diagnostic right cervical facet MBB #2  (02/05/2023) TENS ordered   Under consideration:   Diagnostic left inferolateral and recurrent genicular NB #1  Therapeutic  left inferolateral and recurrent genicular nerve RFA #1  Diagnostic right T9-10 thoracic ESI #1  Diagnostic bilateral T9, T10, T11, and T12 MMB thoracic facet RFA (DIFICULT CANDIDATE DUE TO HARDWARE)    Completed:   Diagnostic left genicular NB x1 (07/03/2021) (100/100/50/50)  Therapeutic left genicular nerve RFA x1 (08/09/2021) (100/100/30/0)  Diagnostic bilateral cervical facet MBB x1 (10/26/2020) (100/100/0)  Diagnostic bilateral suprascapular NB x1 (10/05/2020) (100/100/100 x1 day/0)  Diagnostic right cervical ESI x1 (09/14/2020) (100/100/100/90-100)  Diagnostic/therapeutic bilateral T9-T12 thoracic facet MBB x2 (06/06/2020) (100/100/0)  Diagnostic/Therapeutic right trapezoid bursa injection x1 (11/09/2020) (did not follow-up)    Therapeutic  Palliative (PRN) options:   Therapeutic/palliative bilateral T9-12 thoracic facet MBB #3  Therapeutic/palliative right trapezoid bursa injection #2        Recent Visits Date Type Provider Dept  02/05/23 Office Visit Delano Metz, MD [facility]  Showing recent visits within past 90 days and meeting all other requirements Today's Visits Date Type Provider Dept  03/25/23 Procedure visit Delano Metz, MD [facility]  Showing today's visits and meeting all other requirements Future Appointments No visits  were found meeting these conditions. Showing future appointments within next 90 days and meeting all other requirements  Disposition: Discharge home  Discharge (Date  Time): 03/25/2023; 1112 hrs.   Primary Care Physician: Dana Allan, MD Location: [facility] Note by: Oswaldo Done, MD (TTS technology used. I apologize for any typographical errors that were not detected and corrected.) Date: 03/25/2023; Time: 1:05 PM  Disclaimer:  Medicine is not an Visual merchandiser. The only guarantee in medicine is that nothing is guaranteed. It is important to note that the decision to  proceed with this intervention was based on the information collected from the patient. The Data and conclusions were drawn from the patient's questionnaire, the interview, and the physical examination. Because the information was provided in large part by the patient, it cannot be guaranteed that it has not been purposely or unconsciously manipulated. Every effort has been made to obtain as much relevant data as possible for this evaluation. It is important to note that the conclusions that lead to this procedure are derived in large part from the available data. Always take into account that the treatment will also be dependent on availability of resources and existing treatment guidelines, considered by other Pain Management Practitioners as being common knowledge and practice, at the time of the intervention. For Medico-Legal purposes, it is also important to point out that variation in procedural techniques and pharmacological choices are the acceptable norm. The indications, contraindications, technique, and results of the above procedure should only be interpreted and judged by a Board-Certified Interventional Pain Specialist with extensive familiarity and expertise in the same exact procedure and technique.

## 2023-03-25 NOTE — Progress Notes (Signed)
Safety precautions to be maintained throughout the outpatient stay will include: orient to surroundings, keep bed in low position, maintain call bell within reach at all times, provide assistance with transfer out of bed and ambulation.  

## 2023-03-25 NOTE — Patient Instructions (Signed)

## 2023-03-26 ENCOUNTER — Telehealth: Payer: Self-pay

## 2023-03-26 ENCOUNTER — Encounter: Admission: RE | Payer: Self-pay | Source: Home / Self Care

## 2023-03-26 ENCOUNTER — Inpatient Hospital Stay: Admission: RE | Admit: 2023-03-26 | Payer: Medicare Other | Source: Home / Self Care | Admitting: Surgery

## 2023-03-26 HISTORY — PX: COLON SURGERY: SHX602

## 2023-03-26 SURGERY — COLECTOMY, SIGMOID, ROBOT-ASSISTED
Anesthesia: General

## 2023-03-26 NOTE — Telephone Encounter (Signed)
Post procedure follow up.  LM 

## 2023-03-27 ENCOUNTER — Ambulatory Visit (INDEPENDENT_AMBULATORY_CARE_PROVIDER_SITE_OTHER): Payer: Medicare Other | Admitting: Neurosurgery

## 2023-03-27 ENCOUNTER — Encounter: Payer: Self-pay | Admitting: Neurosurgery

## 2023-03-27 VITALS — BP 133/81 | HR 70 | Ht 59.0 in | Wt 135.0 lb

## 2023-03-27 DIAGNOSIS — Z9889 Other specified postprocedural states: Secondary | ICD-10-CM

## 2023-03-27 DIAGNOSIS — T84498D Other mechanical complication of other internal orthopedic devices, implants and grafts, subsequent encounter: Secondary | ICD-10-CM

## 2023-03-27 DIAGNOSIS — Z09 Encounter for follow-up examination after completed treatment for conditions other than malignant neoplasm: Secondary | ICD-10-CM

## 2023-03-27 DIAGNOSIS — G8929 Other chronic pain: Secondary | ICD-10-CM

## 2023-03-27 NOTE — Progress Notes (Signed)
   REFERRING PHYSICIAN:  Dana Allan, Md [address] [place],  [place] [address]  DOS: 03/12/23 removal of iliac screws   HISTORY OF PRESENT ILLNESS: Maria Spencer is approximately 2 weeks status post iliac screw removal. she is doing well operatively.  She does continue to have some left-sided back pain but states that the rating leg pain particularly in her right knee has pretty much resolved.  She does have an upcoming colectomy scheduled on 04/18/2023 with general surgery.   PHYSICAL EXAMINATION:  General: Patient is well developed, well nourished, calm, collected, and in no apparent distress.   NEUROLOGICAL:  General: In no acute distress.   Awake, alert, oriented to person, place, and time.  Pupils equal round and reactive to light.  Facial tone is symmetric.    Strength:            Side Iliopsoas Quads Hamstring PF DF EHL  R 5 5 5 5 5 5   L 5 5 5 5 5 5    Incision c/d/i well-healed   ROS (Neurologic):  Negative except as noted above  IMAGING: No interval imaging to review today  ASSESSMENT/PLAN:  Maria Spencer is doing well approximately 2 weeks after iliac screw removal.  She is pleased with her postoperative recovery thus far!  She is cleared from our standpoint to move forward with her colectomy.  She does have her follow-up with Dr. Marcell Barlow on 05/01/2023 but is not sure that she will be cleared to drive as there is several unknown surrounding her upcoming surgery.  She will let us know if she is not cleared to drive and we will be more than happy to convert her appointment to a telephone visit.   We discussed activity escalation and I have advised the patient to lift up to 10 pounds until 6 weeks after surgery, then increase up to 25 pounds until 12 weeks after surgery.  After 12 weeks post-op, the patient advised to increase activity as tolerated. she will follow up with Dr. Myer Haff in 4 weeks or sooner should she have any questions or concerns.  Advised to contact  the office if any questions or concerns arise.  Manning Charity PA-C Department of neurosurgery

## 2023-04-04 DIAGNOSIS — M1711 Unilateral primary osteoarthritis, right knee: Secondary | ICD-10-CM | POA: Diagnosis not present

## 2023-04-08 ENCOUNTER — Encounter
Admission: RE | Admit: 2023-04-08 | Discharge: 2023-04-08 | Disposition: A | Discharge status: Home / Self Care | Payer: Medicare Other | Source: Ambulatory Visit | Attending: Surgery | Admitting: Surgery

## 2023-04-08 ENCOUNTER — Ambulatory Visit: Payer: Self-pay | Admitting: Surgery

## 2023-04-08 ENCOUNTER — Encounter: Payer: Medicare Other | Admitting: Family Medicine

## 2023-04-08 ENCOUNTER — Other Ambulatory Visit: Payer: Self-pay

## 2023-04-08 DIAGNOSIS — K5792 Diverticulitis of intestine, part unspecified, without perforation or abscess without bleeding: Secondary | ICD-10-CM

## 2023-04-08 NOTE — Patient Instructions (Addendum)
Your procedure is scheduled on: 04/16/23 - Wednesday Report to the Registration Desk on the 1st floor of the [facility]. To find out your arrival time, please call [phone] between 1PM - 3PM on: 04/15/23 - Tuesday If your arrival time is 6:00 am, do not arrive before that time as the [facility] entrance doors do not open until 6:00 am.  REMEMBER: Instructions that are not followed completely may result in serious medical risk, up to and including death; or upon the discretion of your surgeon and anesthesiologist your surgery may need to be rescheduled.  Follow Bowel Prep instructions discussed with you by Dr. Claudine Mouton prior to your surgery.  Take medications prior to your surgery , prescribed to you by Dr. Claudine Mouton.  One week prior to surgery: Stop Anti-inflammatories (NSAIDS) such as Advil, Aleve, Ibuprofen, Motrin, Naproxen, Naprosyn and Aspirin based products such as Excedrin, Goody's Powder, BC Powder.  Stop taking beginning 04/09/23 ANY OVER THE COUNTER supplements until after surgery.  You may however, continue to take Tylenol if needed for pain up until the day of surgery.   TAKE ONLY THESE MEDICATIONS THE MORNING OF SURGERY WITH A SIP OF WATER:  levothyroxine (SYNTHROID)  metoprolol succinate (TOPROL-XL)    No Alcohol for 24 hours before or after surgery.  No Smoking including e-cigarettes for 24 hours before surgery.  No chewable tobacco products for at least 6 hours before surgery.  No nicotine patches on the day of surgery.  Do not use any "recreational" drugs for at least a week (preferably 2 weeks) before your surgery.  Please be advised that the combination of cocaine and anesthesia may have negative outcomes, up to and including death. If you test positive for cocaine, your surgery will be cancelled.  On the morning of surgery brush your teeth with toothpaste and water, you may rinse your mouth with mouthwash if you wish.  Do not swallow any toothpaste  or mouthwash.  Use CHG Soap or wipes as directed on instruction sheet.  Do not wear jewelry, make-up, hairpins, clips or nail polish.  Do not wear lotions, powders, or perfumes.   Do not shave body hair from the neck down 48 hours before surgery.  Contact lenses, hearing aids and dentures may not be worn into surgery.  Do not bring valuables to the hospital. [facility] is not responsible for any missing/lost belongings or valuables.   Notify your doctor if there is any change in your medical condition (cold, fever, infection).  Wear comfortable clothing (specific to your surgery type) to the hospital.  After surgery, you can help prevent lung complications by doing breathing exercises.  Take deep breaths and cough every 1-2 hours. Your doctor may order a device called an Incentive Spirometer to help you take deep breaths. When coughing or sneezing, hold a pillow firmly against your incision with both hands. This is called "splinting." Doing this helps protect your incision. It also decreases belly discomfort.  If you are being admitted to the hospital overnight, leave your suitcase in the car. After surgery it may be brought to your room.  In case of increased patient census, it may be necessary for you, the patient, to continue your postoperative care in the Same Day Surgery department.  If you are being discharged the day of surgery, you will not be allowed to drive home. You will need a responsible individual to drive you home and stay with you for 24 hours after surgery.   If you are taking  public transportation, you will need to have a responsible individual with you.  Please call the Pre-admissions Testing Dept. at [phone] if you have any questions about these instructions.  Surgery Visitation Policy:  Patients having surgery or a procedure may have two visitors.  Children under the age of 75 must have an adult with them who is not the patient.  Inpatient  Visitation:    Visiting hours are 7 a.m. to 8 p.m. Up to four visitors are allowed at one time in a patient room. The visitors may rotate out with other people during the day.  One visitor age 68 or older may stay with the patient overnight and must be in the room by 8 p.m.    Preparing for Surgery with CHLORHEXIDINE GLUCONATE (CHG) Soap  Chlorhexidine Gluconate (CHG) Soap  o An antiseptic cleaner that kills germs and bonds with the skin to continue killing germs even after washing  o Used for showering the night before surgery and morning of surgery  Before surgery, you can play an important role by reducing the number of germs on your skin.  CHG (Chlorhexidine gluconate) soap is an antiseptic cleanser which kills germs and bonds with the skin to continue killing germs even after washing.  Please do not use if you have an allergy to CHG or antibacterial soaps. If your skin becomes reddened/irritated stop using the CHG.  1. Shower the NIGHT BEFORE SURGERY and the MORNING OF SURGERY with CHG soap.  2. If you choose to wash your hair, wash your hair first as usual with your normal shampoo.  3. After shampooing, rinse your hair and body thoroughly to remove the shampoo.  4. Use CHG as you would any other liquid soap. You can apply CHG directly to the skin and wash gently with a scrungie or a clean washcloth.  5. Apply the CHG soap to your body only from the neck down. Do not use on open wounds or open sores. Avoid contact with your eyes, ears, mouth, and genitals (private parts). Wash face and genitals (private parts) with your normal soap.  6. Wash thoroughly, paying special attention to the area where your surgery will be performed.  7. Thoroughly rinse your body with warm water.  8. Do not shower/wash with your normal soap after using and rinsing off the CHG soap.  9. Pat yourself dry with a clean towel.  10. Wear clean pajamas to bed the night before surgery.  12. Place  clean sheets on your bed the night of your first shower and do not sleep with pets.  13. Shower again with the CHG soap on the day of surgery prior to arriving at the hospital.  14. Do not apply any deodorants/lotions/powders.  15. Please wear clean clothes to the hospital.

## 2023-04-08 NOTE — Patient Instructions (Signed)
Your procedure is scheduled on: 04/16/23 - Wednesday Report to the Registration Desk on the 1st floor of the [facility]. To find out your arrival time, please call [phone] between 1PM - 3PM on: 04/15/23 - Tuesday If your arrival time is 6:00 am, do not arrive before that time as the [facility] entrance doors do not open until 6:00 am.  REMEMBER: Instructions that are not followed completely may result in serious medical risk, up to and including death; or upon the discretion of your surgeon and anesthesiologist your surgery may need to be rescheduled.  Follow Bowel Prep explained to you by Dr. Claudine Mouton before your surgery.  Take medications given to you as directed by Dr. Claudine Mouton.  One week prior to surgery: Stop taking beginning 04/09/23,  Anti-inflammatories (NSAIDS) such as Advil, Aleve, Ibuprofen, Motrin, Naproxen, Naprosyn and Aspirin based products such as Excedrin, Goody's Powder, BC Powder.   Stop beginning 04/09/23, ANY OVER THE COUNTER supplements until after surgery.  You may however, continue to take Tylenol if needed for pain up until the day of surgery.   TAKE ONLY THESE MEDICATIONS THE MORNING OF SURGERY WITH A SIP OF WATER:  levothyroxine (SYNTHROID)  metoprolol succinate (TOPROL-XL)   No Alcohol for 24 hours before or after surgery.  No Smoking including e-cigarettes for 24 hours before surgery.  No chewable tobacco products for at least 6 hours before surgery.  No nicotine patches on the day of surgery.  Do not use any "recreational" drugs for at least a week (preferably 2 weeks) before your surgery.  Please be advised that the combination of cocaine and anesthesia may have negative outcomes, up to and including death. If you test positive for cocaine, your surgery will be cancelled.  On the morning of surgery brush your teeth with toothpaste and water, you may rinse your mouth with mouthwash if you wish. Do not swallow any toothpaste or  mouthwash.  Use CHG Soap or wipes as directed on instruction sheet.  Do not wear jewelry, make-up, hairpins, clips or nail polish.  Do not wear lotions, powders, or perfumes.   Do not shave body hair from the neck down 48 hours before surgery.  Contact lenses, hearing aids and dentures may not be worn into surgery.  Do not bring valuables to the hospital. [facility] is not responsible for any missing/lost belongings or valuables.   Notify your doctor if there is any change in your medical condition (cold, fever, infection).  Wear comfortable clothing (specific to your surgery type) to the hospital.  After surgery, you can help prevent lung complications by doing breathing exercises.  Take deep breaths and cough every 1-2 hours. Your doctor may order a device called an Incentive Spirometer to help you take deep breaths. When coughing or sneezing, hold a pillow firmly against your incision with both hands. This is called "splinting." Doing this helps protect your incision. It also decreases belly discomfort.  If you are being admitted to the hospital overnight, leave your suitcase in the car. After surgery it may be brought to your room.  In case of increased patient census, it may be necessary for you, the patient, to continue your postoperative care in the Same Day Surgery department.  If you are being discharged the day of surgery, you will not be allowed to drive home. You will need a responsible individual to drive you home and stay with you for 24 hours after surgery.   If you are taking public transportation, you  will need to have a responsible individual with you.  Please call the Pre-admissions Testing Dept. at [phone] if you have any questions about these instructions.  Surgery Visitation Policy:  Patients having surgery or a procedure may have two visitors.  Children under the age of 80 must have an adult with them who is not the patient.  Inpatient Visitation:     Visiting hours are 7 a.m. to 8 p.m. Up to four visitors are allowed at one time in a patient room. The visitors may rotate out with other people during the day.  One visitor age 97 or older may stay with the patient overnight and must be in the room by 8 p.m.    Preparing for Surgery with CHLORHEXIDINE GLUCONATE (CHG) Soap  Chlorhexidine Gluconate (CHG) Soap  o An antiseptic cleaner that kills germs and bonds with the skin to continue killing germs even after washing  o Used for showering the night before surgery and morning of surgery  Before surgery, you can play an important role by reducing the number of germs on your skin.  CHG (Chlorhexidine gluconate) soap is an antiseptic cleanser which kills germs and bonds with the skin to continue killing germs even after washing.  Please do not use if you have an allergy to CHG or antibacterial soaps. If your skin becomes reddened/irritated stop using the CHG.  1. Shower the NIGHT BEFORE SURGERY and the MORNING OF SURGERY with CHG soap.  2. If you choose to wash your hair, wash your hair first as usual with your normal shampoo.  3. After shampooing, rinse your hair and body thoroughly to remove the shampoo.  4. Use CHG as you would any other liquid soap. You can apply CHG directly to the skin and wash gently with a scrungie or a clean washcloth.  5. Apply the CHG soap to your body only from the neck down. Do not use on open wounds or open sores. Avoid contact with your eyes, ears, mouth, and genitals (private parts). Wash face and genitals (private parts) with your normal soap.  6. Wash thoroughly, paying special attention to the area where your surgery will be performed.  7. Thoroughly rinse your body with warm water.  8. Do not shower/wash with your normal soap after using and rinsing off the CHG soap.  9. Pat yourself dry with a clean towel.  10. Wear clean pajamas to bed the night before surgery.  12. Place clean sheets on  your bed the night of your first shower and do not sleep with pets.  13. Shower again with the CHG soap on the day of surgery prior to arriving at the hospital.  14. Do not apply any deodorants/lotions/powders.  15. Please wear clean clothes to the hospital.

## 2023-04-11 ENCOUNTER — Encounter
Admission: RE | Admit: 2023-04-11 | Discharge: 2023-04-11 | Disposition: A | Discharge status: Home / Self Care | Payer: Medicare Other | Source: Ambulatory Visit | Attending: Surgery | Admitting: Surgery

## 2023-04-11 DIAGNOSIS — Z01812 Encounter for preprocedural laboratory examination: Secondary | ICD-10-CM | POA: Diagnosis not present

## 2023-04-11 LAB — TYPE AND SCREEN
ABO/RH(D): O POS
Antibody Screen: NEGATIVE

## 2023-04-15 MED ORDER — ACETAMINOPHEN 500 MG PO TABS
1000.0000 mg | ORAL_TABLET | ORAL | Status: AC
  Administered 2023-04-16: 1000 mg via ORAL

## 2023-04-15 MED ORDER — BUPIVACAINE LIPOSOME 1.3 % IJ SUSP: 20.0000 mL | Freq: Once | INTRAMUSCULAR | Status: DC

## 2023-04-15 MED ORDER — CHLORHEXIDINE GLUCONATE 0.12 % MT SOLN
15.0000 mL | Freq: Once | OROMUCOSAL | Status: AC
  Administered 2023-04-16: 15 mL via OROMUCOSAL

## 2023-04-15 MED ORDER — GABAPENTIN 300 MG PO CAPS
300.0000 mg | ORAL_CAPSULE | ORAL | Status: AC
  Administered 2023-04-16: 300 mg via ORAL

## 2023-04-15 MED ORDER — LACTATED RINGERS IV SOLN: INTRAVENOUS | Status: DC

## 2023-04-15 MED ORDER — CHLORHEXIDINE GLUCONATE CLOTH 2 % EX PADS: 6.0000 | MEDICATED_PAD | Freq: Once | CUTANEOUS | Status: DC

## 2023-04-15 MED ORDER — FAMOTIDINE 20 MG PO TABS
20.0000 mg | ORAL_TABLET | Freq: Once | ORAL | Status: AC
  Administered 2023-04-16: 20 mg via ORAL

## 2023-04-15 MED ORDER — ORAL CARE MOUTH RINSE: 15.0000 mL | Freq: Once | OROMUCOSAL | Status: AC

## 2023-04-15 MED ORDER — SODIUM CHLORIDE 0.9 % IV SOLN
2.0000 g | INTRAVENOUS | Status: AC
  Administered 2023-04-16: 2 g via INTRAVENOUS

## 2023-04-16 ENCOUNTER — Encounter: Payer: Self-pay | Admitting: Surgery

## 2023-04-16 ENCOUNTER — Inpatient Hospital Stay: Payer: Medicare Other | Admitting: Urgent Care

## 2023-04-16 ENCOUNTER — Other Ambulatory Visit: Payer: Self-pay

## 2023-04-16 ENCOUNTER — Inpatient Hospital Stay
Admission: RE | Admit: 2023-04-16 | Discharge: 2023-04-20 | DRG: 330 | Disposition: A | Discharge status: Home Health | Payer: Medicare Other | Attending: Surgery | Admitting: Surgery

## 2023-04-16 ENCOUNTER — Encounter
Admission: RE | Disposition: A | Discharge status: Home Health | Payer: Self-pay | Source: Home / Self Care | Attending: Surgery

## 2023-04-16 DIAGNOSIS — I10 Essential (primary) hypertension: Secondary | ICD-10-CM | POA: Diagnosis present

## 2023-04-16 DIAGNOSIS — K5792 Diverticulitis of intestine, part unspecified, without perforation or abscess without bleeding: Secondary | ICD-10-CM

## 2023-04-16 DIAGNOSIS — Z981 Arthrodesis status: Secondary | ICD-10-CM | POA: Diagnosis not present

## 2023-04-16 DIAGNOSIS — Z833 Family history of diabetes mellitus: Secondary | ICD-10-CM

## 2023-04-16 DIAGNOSIS — Z923 Personal history of irradiation: Secondary | ICD-10-CM

## 2023-04-16 DIAGNOSIS — E039 Hypothyroidism, unspecified: Secondary | ICD-10-CM | POA: Diagnosis not present

## 2023-04-16 DIAGNOSIS — E785 Hyperlipidemia, unspecified: Secondary | ICD-10-CM | POA: Diagnosis not present

## 2023-04-16 DIAGNOSIS — E871 Hypo-osmolality and hyponatremia: Secondary | ICD-10-CM | POA: Diagnosis not present

## 2023-04-16 DIAGNOSIS — Z8261 Family history of arthritis: Secondary | ICD-10-CM

## 2023-04-16 DIAGNOSIS — Z96611 Presence of right artificial shoulder joint: Secondary | ICD-10-CM | POA: Diagnosis present

## 2023-04-16 DIAGNOSIS — R7303 Prediabetes: Secondary | ICD-10-CM | POA: Diagnosis present

## 2023-04-16 DIAGNOSIS — Z79890 Hormone replacement therapy (postmenopausal): Secondary | ICD-10-CM | POA: Diagnosis not present

## 2023-04-16 DIAGNOSIS — Z8616 Personal history of COVID-19: Secondary | ICD-10-CM | POA: Diagnosis not present

## 2023-04-16 DIAGNOSIS — E876 Hypokalemia: Secondary | ICD-10-CM | POA: Diagnosis present

## 2023-04-16 DIAGNOSIS — Z810 Family history of intellectual disabilities: Secondary | ICD-10-CM | POA: Diagnosis not present

## 2023-04-16 DIAGNOSIS — Z8249 Family history of ischemic heart disease and other diseases of the circulatory system: Secondary | ICD-10-CM

## 2023-04-16 DIAGNOSIS — K5732 Diverticulitis of large intestine without perforation or abscess without bleeding: Secondary | ICD-10-CM | POA: Diagnosis present

## 2023-04-16 DIAGNOSIS — Z83438 Family history of other disorder of lipoprotein metabolism and other lipidemia: Secondary | ICD-10-CM | POA: Diagnosis not present

## 2023-04-16 DIAGNOSIS — Z853 Personal history of malignant neoplasm of breast: Secondary | ICD-10-CM

## 2023-04-16 DIAGNOSIS — K219 Gastro-esophageal reflux disease without esophagitis: Secondary | ICD-10-CM | POA: Diagnosis not present

## 2023-04-16 DIAGNOSIS — Z882 Allergy status to sulfonamides status: Secondary | ICD-10-CM | POA: Diagnosis not present

## 2023-04-16 DIAGNOSIS — M810 Age-related osteoporosis without current pathological fracture: Secondary | ICD-10-CM | POA: Diagnosis present

## 2023-04-16 DIAGNOSIS — Z79899 Other long term (current) drug therapy: Secondary | ICD-10-CM

## 2023-04-16 DIAGNOSIS — G894 Chronic pain syndrome: Secondary | ICD-10-CM | POA: Diagnosis present

## 2023-04-16 DIAGNOSIS — Z96652 Presence of left artificial knee joint: Secondary | ICD-10-CM | POA: Diagnosis not present

## 2023-04-16 DIAGNOSIS — Z888 Allergy status to other drugs, medicaments and biological substances status: Secondary | ICD-10-CM

## 2023-04-16 DIAGNOSIS — Z01812 Encounter for preprocedural laboratory examination: Secondary | ICD-10-CM

## 2023-04-16 DIAGNOSIS — K5720 Diverticulitis of large intestine with perforation and abscess without bleeding: Secondary | ICD-10-CM | POA: Diagnosis not present

## 2023-04-16 SURGERY — COLECTOMY, SIGMOID, ROBOT-ASSISTED
Anesthesia: General | Site: Ureter

## 2023-04-16 MED ORDER — GLYCOPYRROLATE 0.2 MG/ML IJ SOLN
INTRAMUSCULAR | Status: DC | PRN
  Administered 2023-04-16: .2 mg via INTRAVENOUS

## 2023-04-16 MED ORDER — OXYCODONE HCL 5 MG PO TABS
5.0000 mg | ORAL_TABLET | Freq: Once | ORAL | Status: AC | PRN
  Administered 2023-04-16: 5 mg via ORAL

## 2023-04-16 MED ORDER — EPHEDRINE SULFATE (PRESSORS) 50 MG/ML IJ SOLN
INTRAMUSCULAR | Status: DC | PRN
  Administered 2023-04-16 (×2): 5 mg via INTRAVENOUS
  Administered 2023-04-16: 10 mg via INTRAVENOUS

## 2023-04-16 MED ORDER — FENTANYL CITRATE (PF) 100 MCG/2ML IJ SOLN
25.0000 ug | INTRAMUSCULAR | Status: AC | PRN
  Administered 2023-04-16: 25 ug via INTRAVENOUS
  Administered 2023-04-16: 50 ug via INTRAVENOUS
  Administered 2023-04-16 (×2): 25 ug via INTRAVENOUS
  Administered 2023-04-16: 50 ug via INTRAVENOUS
  Administered 2023-04-16: 25 ug via INTRAVENOUS

## 2023-04-16 MED ORDER — PHENYLEPHRINE 80 MCG/ML (10ML) SYRINGE FOR IV PUSH (FOR BLOOD PRESSURE SUPPORT)
PREFILLED_SYRINGE | INTRAVENOUS | Status: AC
  Filled 2023-04-16: qty 10

## 2023-04-16 MED ORDER — BUPIVACAINE LIPOSOME 1.3 % IJ SUSP
INTRAMUSCULAR | Status: AC
  Filled 2023-04-16: qty 20

## 2023-04-16 MED ORDER — LEVOTHYROXINE SODIUM 88 MCG PO TABS
88.0000 ug | ORAL_TABLET | Freq: Every day | ORAL | Status: DC
  Administered 2023-04-17 – 2023-04-20 (×4): 88 ug via ORAL
  Filled 2023-04-16 (×4): qty 1

## 2023-04-16 MED ORDER — PROMETHAZINE HCL 25 MG/ML IJ SOLN: 6.2500 mg | INTRAMUSCULAR | Status: DC | PRN

## 2023-04-16 MED ORDER — ROCURONIUM BROMIDE 10 MG/ML (PF) SYRINGE
PREFILLED_SYRINGE | INTRAVENOUS | Status: AC
  Filled 2023-04-16: qty 10

## 2023-04-16 MED ORDER — BUPIVACAINE LIPOSOME 1.3 % IJ SUSP
INTRAMUSCULAR | Status: DC | PRN
  Administered 2023-04-16: 50 mL via INTRAMUSCULAR

## 2023-04-16 MED ORDER — DEXMEDETOMIDINE HCL IN NACL 80 MCG/20ML IV SOLN
INTRAVENOUS | Status: DC | PRN
  Administered 2023-04-16: 8 ug via INTRAVENOUS

## 2023-04-16 MED ORDER — DEXAMETHASONE SODIUM PHOSPHATE 10 MG/ML IJ SOLN
INTRAMUSCULAR | Status: DC | PRN
  Administered 2023-04-16: 10 mg via INTRAVENOUS

## 2023-04-16 MED ORDER — OXYCODONE HCL 5 MG PO TABS
ORAL_TABLET | ORAL | Status: AC
  Filled 2023-04-16: qty 1

## 2023-04-16 MED ORDER — ALVIMOPAN 12 MG PO CAPS
12.0000 mg | ORAL_CAPSULE | Freq: Two times a day (BID) | ORAL | Status: DC
  Administered 2023-04-17: 12 mg via ORAL
  Filled 2023-04-16: qty 1

## 2023-04-16 MED ORDER — HYDRALAZINE HCL 20 MG/ML IJ SOLN: 10.0000 mg | INTRAMUSCULAR | Status: DC | PRN

## 2023-04-16 MED ORDER — ONDANSETRON 4 MG PO TBDP: 4.0000 mg | ORAL_TABLET | Freq: Four times a day (QID) | ORAL | Status: DC | PRN

## 2023-04-16 MED ORDER — PROPOFOL 10 MG/ML IV BOLUS
INTRAVENOUS | Status: DC | PRN
  Administered 2023-04-16: 130 mg via INTRAVENOUS

## 2023-04-16 MED ORDER — SODIUM CHLORIDE 0.9 % IR SOLN
Status: DC | PRN
  Administered 2023-04-16: 1000 mL

## 2023-04-16 MED ORDER — ACETAMINOPHEN 10 MG/ML IV SOLN
INTRAVENOUS | Status: AC
  Filled 2023-04-16: qty 100

## 2023-04-16 MED ORDER — IRBESARTAN 150 MG PO TABS
300.0000 mg | ORAL_TABLET | Freq: Every day | ORAL | Status: DC
  Administered 2023-04-17 – 2023-04-20 (×4): 300 mg via ORAL
  Filled 2023-04-16 (×5): qty 2

## 2023-04-16 MED ORDER — ONDANSETRON HCL 4 MG/2ML IJ SOLN: 4.0000 mg | Freq: Four times a day (QID) | INTRAMUSCULAR | Status: DC | PRN

## 2023-04-16 MED ORDER — 0.9 % SODIUM CHLORIDE (POUR BTL) OPTIME
TOPICAL | Status: DC | PRN
  Administered 2023-04-16: 500 mL

## 2023-04-16 MED ORDER — MIDAZOLAM HCL 2 MG/2ML IJ SOLN
INTRAMUSCULAR | Status: AC
  Filled 2023-04-16: qty 2

## 2023-04-16 MED ORDER — METHOCARBAMOL 500 MG PO TABS: 500.0000 mg | ORAL_TABLET | Freq: Four times a day (QID) | ORAL | Status: DC | PRN

## 2023-04-16 MED ORDER — CHLORHEXIDINE GLUCONATE 0.12 % MT SOLN
OROMUCOSAL | Status: AC
  Filled 2023-04-16: qty 15

## 2023-04-16 MED ORDER — INDOCYANINE GREEN 25 MG IV SOLR
INTRAVENOUS | Status: DC | PRN
  Administered 2023-04-16: 5 mg

## 2023-04-16 MED ORDER — OXYCODONE HCL 5 MG/5ML PO SOLN: 5.0000 mg | Freq: Once | ORAL | Status: AC | PRN

## 2023-04-16 MED ORDER — ATORVASTATIN CALCIUM 20 MG PO TABS
20.0000 mg | ORAL_TABLET | ORAL | Status: DC
  Administered 2023-04-18 – 2023-04-20 (×2): 20 mg via ORAL
  Filled 2023-04-16 (×4): qty 1

## 2023-04-16 MED ORDER — ACETAMINOPHEN 10 MG/ML IV SOLN: 1000.0000 mg | Freq: Once | INTRAVENOUS | Status: DC | PRN

## 2023-04-16 MED ORDER — FENTANYL CITRATE (PF) 100 MCG/2ML IJ SOLN
INTRAMUSCULAR | Status: AC
  Filled 2023-04-16: qty 2

## 2023-04-16 MED ORDER — INDOCYANINE GREEN 25 MG IV SOLR
INTRAVENOUS | Status: DC | PRN
  Administered 2023-04-16 (×2): 2.5 mg via INTRAVENOUS

## 2023-04-16 MED ORDER — SODIUM CHLORIDE 0.9 % IV SOLN: INTRAVENOUS | Status: DC

## 2023-04-16 MED ORDER — ONDANSETRON HCL 4 MG/2ML IJ SOLN
INTRAMUSCULAR | Status: DC | PRN
  Administered 2023-04-16: 4 mg via INTRAVENOUS

## 2023-04-16 MED ORDER — FENTANYL CITRATE (PF) 100 MCG/2ML IJ SOLN
INTRAMUSCULAR | Status: DC | PRN
  Administered 2023-04-16 (×4): 50 ug via INTRAVENOUS

## 2023-04-16 MED ORDER — LIDOCAINE HCL (CARDIAC) PF 100 MG/5ML IV SOSY
PREFILLED_SYRINGE | INTRAVENOUS | Status: DC | PRN
  Administered 2023-04-16: 50 mg via INTRAVENOUS

## 2023-04-16 MED ORDER — BUPIVACAINE HCL (PF) 0.25 % IJ SOLN
INTRAMUSCULAR | Status: AC
  Filled 2023-04-16: qty 30

## 2023-04-16 MED ORDER — BSS IO SOLN
15.0000 mL | Freq: Once | INTRAOCULAR | Status: AC
  Administered 2023-04-16: 15 mL
  Filled 2023-04-16: qty 15

## 2023-04-16 MED ORDER — FAMOTIDINE 20 MG PO TABS
ORAL_TABLET | ORAL | Status: AC
  Filled 2023-04-16: qty 1

## 2023-04-16 MED ORDER — OXYCODONE-ACETAMINOPHEN 5-325 MG PO TABS
1.0000 | ORAL_TABLET | ORAL | Status: DC | PRN
  Administered 2023-04-16 – 2023-04-18 (×6): 2 via ORAL
  Administered 2023-04-18: 1 via ORAL
  Administered 2023-04-18: 2 via ORAL
  Administered 2023-04-19 (×3): 1 via ORAL
  Filled 2023-04-16 (×2): qty 2
  Filled 2023-04-16 (×2): qty 1
  Filled 2023-04-16 (×2): qty 2
  Filled 2023-04-16: qty 1
  Filled 2023-04-16 (×3): qty 2
  Filled 2023-04-16: qty 1

## 2023-04-16 MED ORDER — GABAPENTIN 300 MG PO CAPS
300.0000 mg | ORAL_CAPSULE | Freq: Two times a day (BID) | ORAL | Status: DC
  Administered 2023-04-16 – 2023-04-20 (×8): 300 mg via ORAL
  Filled 2023-04-16 (×8): qty 1

## 2023-04-16 MED ORDER — KETOROLAC TROMETHAMINE 30 MG/ML IJ SOLN
INTRAMUSCULAR | Status: AC
  Filled 2023-04-16: qty 1

## 2023-04-16 MED ORDER — METOPROLOL SUCCINATE ER 50 MG PO TB24
50.0000 mg | ORAL_TABLET | Freq: Every day | ORAL | Status: DC
  Administered 2023-04-17 – 2023-04-20 (×4): 50 mg via ORAL
  Filled 2023-04-16 (×4): qty 1

## 2023-04-16 MED ORDER — PHENYLEPHRINE HCL (PRESSORS) 10 MG/ML IV SOLN
INTRAVENOUS | Status: DC | PRN
  Administered 2023-04-16: 80 ug via INTRAVENOUS

## 2023-04-16 MED ORDER — DEXAMETHASONE SODIUM PHOSPHATE 10 MG/ML IJ SOLN
INTRAMUSCULAR | Status: AC
  Filled 2023-04-16: qty 1

## 2023-04-16 MED ORDER — DIPHENHYDRAMINE HCL 50 MG/ML IJ SOLN: 12.5000 mg | Freq: Four times a day (QID) | INTRAMUSCULAR | Status: DC | PRN

## 2023-04-16 MED ORDER — ACETAMINOPHEN 500 MG PO TABS
ORAL_TABLET | ORAL | Status: AC
  Filled 2023-04-16: qty 2

## 2023-04-16 MED ORDER — GABAPENTIN 300 MG PO CAPS
ORAL_CAPSULE | ORAL | Status: AC
  Filled 2023-04-16: qty 1

## 2023-04-16 MED ORDER — ZOLPIDEM TARTRATE 5 MG PO TABS: 5.0000 mg | ORAL_TABLET | Freq: Every evening | ORAL | Status: DC | PRN

## 2023-04-16 MED ORDER — PROPOFOL 10 MG/ML IV BOLUS
INTRAVENOUS | Status: AC
  Filled 2023-04-16: qty 20

## 2023-04-16 MED ORDER — SODIUM CHLORIDE 0.9 % IV SOLN
2.0000 g | Freq: Two times a day (BID) | INTRAVENOUS | Status: AC
  Administered 2023-04-16: 2 g via INTRAVENOUS
  Filled 2023-04-16: qty 2

## 2023-04-16 MED ORDER — PANTOPRAZOLE SODIUM 40 MG IV SOLR
40.0000 mg | Freq: Every day | INTRAVENOUS | Status: DC
  Administered 2023-04-16 – 2023-04-17 (×2): 40 mg via INTRAVENOUS
  Filled 2023-04-16 (×2): qty 10

## 2023-04-16 MED ORDER — ONDANSETRON HCL 4 MG/2ML IJ SOLN
INTRAMUSCULAR | Status: AC
  Filled 2023-04-16: qty 2

## 2023-04-16 MED ORDER — TETRACAINE HCL 0.5 % OP SOLN
1.0000 [drp] | Freq: Once | OPHTHALMIC | Status: AC
  Administered 2023-04-16: 1 [drp] via OPHTHALMIC
  Filled 2023-04-16: qty 4

## 2023-04-16 MED ORDER — SODIUM CHLORIDE 0.9 % IV SOLN
INTRAVENOUS | Status: AC
  Filled 2023-04-16: qty 2

## 2023-04-16 MED ORDER — EPHEDRINE 5 MG/ML INJ
INTRAVENOUS | Status: AC
  Filled 2023-04-16: qty 5

## 2023-04-16 MED ORDER — LABETALOL HCL 5 MG/ML IV SOLN
INTRAVENOUS | Status: AC
  Filled 2023-04-16: qty 4

## 2023-04-16 MED ORDER — ACETAMINOPHEN 10 MG/ML IV SOLN
INTRAVENOUS | Status: DC | PRN
  Administered 2023-04-16: 1000 mg via INTRAVENOUS

## 2023-04-16 MED ORDER — STERILE WATER FOR IRRIGATION IR SOLN
Status: DC | PRN
  Administered 2023-04-16: 500 mL

## 2023-04-16 MED ORDER — KETOROLAC TROMETHAMINE 0.5 % OP SOLN
1.0000 [drp] | Freq: Four times a day (QID) | OPHTHALMIC | Status: AC
  Administered 2023-04-16 – 2023-04-17 (×3): 1 [drp] via OPHTHALMIC
  Filled 2023-04-16: qty 3

## 2023-04-16 MED ORDER — DROPERIDOL 2.5 MG/ML IJ SOLN: 0.6250 mg | Freq: Once | INTRAMUSCULAR | Status: DC | PRN

## 2023-04-16 MED ORDER — SUGAMMADEX SODIUM 500 MG/5ML IV SOLN
INTRAVENOUS | Status: DC | PRN
  Administered 2023-04-16: 200 mg via INTRAVENOUS

## 2023-04-16 MED ORDER — HYDROMORPHONE HCL 1 MG/ML IJ SOLN
1.0000 mg | INTRAMUSCULAR | Status: DC | PRN
  Administered 2023-04-16: 1 mg via INTRAVENOUS
  Filled 2023-04-16 (×2): qty 1

## 2023-04-16 MED ORDER — KETOROLAC TROMETHAMINE 30 MG/ML IJ SOLN
INTRAMUSCULAR | Status: DC | PRN
  Administered 2023-04-16: 15 mg via INTRAVENOUS

## 2023-04-16 MED ORDER — ROCURONIUM BROMIDE 100 MG/10ML IV SOLN
INTRAVENOUS | Status: DC | PRN
  Administered 2023-04-16: 50 mg via INTRAVENOUS
  Administered 2023-04-16: 10 mg via INTRAVENOUS
  Administered 2023-04-16 (×3): 20 mg via INTRAVENOUS

## 2023-04-16 MED ORDER — MIDAZOLAM HCL 2 MG/2ML IJ SOLN
INTRAMUSCULAR | Status: DC | PRN
  Administered 2023-04-16: 1 mg via INTRAVENOUS

## 2023-04-16 MED ORDER — LABETALOL HCL 5 MG/ML IV SOLN
INTRAVENOUS | Status: DC | PRN
  Administered 2023-04-16: 5 mg via INTRAVENOUS

## 2023-04-16 MED ORDER — DIPHENHYDRAMINE HCL 12.5 MG/5ML PO ELIX: 12.5000 mg | ORAL_SOLUTION | Freq: Four times a day (QID) | ORAL | Status: DC | PRN

## 2023-04-16 MED ORDER — LIDOCAINE HCL (PF) 2 % IJ SOLN
INTRAMUSCULAR | Status: AC
  Filled 2023-04-16: qty 5

## 2023-04-16 SURGICAL SUPPLY — 114 items
ADH SKN CLS APL DERMABOND .7 (GAUZE/BANDAGES/DRESSINGS) ×4
BAG DRAIN SIEMENS DORNER NS (MISCELLANEOUS) ×2 IMPLANT
BAG DRN NS LF (MISCELLANEOUS) ×2
BAG DRN RND TRDRP ANRFLXCHMBR (UROLOGICAL SUPPLIES) ×2
BAG LAPAROSCOPIC 12 15 PORT 16 (BASKET) IMPLANT
BAG PRESSURE INF REUSE 3000 (BAG) IMPLANT
BAG RETRIEVAL 12/15 (BASKET) ×2
BAG URINE DRAIN 2000ML AR STRL (UROLOGICAL SUPPLIES) ×2 IMPLANT
BLADE SURG SZ10 CARB STEEL (BLADE) ×2 IMPLANT
CANNULA REDUCER 12-8 DVNC XI (CANNULA) IMPLANT
CATH FOLEY 2WAY  5CC 16FR (CATHETERS) ×2
CATH FOLEY 2WAY 5CC 16FR (CATHETERS) ×2
CATH ROBINSON RED A/P 16FR (CATHETERS) ×2 IMPLANT
CATH URETL OPEN 5X70 (CATHETERS) ×2 IMPLANT
CATH URTH 16FR FL 2W BLN LF (CATHETERS) ×2 IMPLANT
CNTNR URN SCR LID CUP LEK RST (MISCELLANEOUS) IMPLANT
CONT SPEC 4OZ STRL OR WHT (MISCELLANEOUS) ×2
COVER TIP SHEARS 8 DVNC (MISCELLANEOUS) ×2 IMPLANT
DERMABOND ADVANCED .7 DNX12 (GAUZE/BANDAGES/DRESSINGS) ×2 IMPLANT
DRAPE ARM DVNC X/XI (DISPOSABLE) ×8 IMPLANT
DRAPE COLUMN DVNC XI (DISPOSABLE) ×2 IMPLANT
DRAPE LEGGINS SURG 28X43 STRL (DRAPES) ×2 IMPLANT
DRAPE UNDER BUTTOCK W/FLU (DRAPES) ×2 IMPLANT
DRIVER NDL MEGA SUTCUT DVNCXI (INSTRUMENTS) ×2 IMPLANT
DRIVER NDLE MEGA SUTCUT DVNCXI (INSTRUMENTS) ×2 IMPLANT
ELECT CAUTERY BLADE 6.4 (BLADE) ×2 IMPLANT
ELECT REM PT RETURN 9FT ADLT (ELECTROSURGICAL) ×2
ELECTRODE REM PT RTRN 9FT ADLT (ELECTROSURGICAL) ×2 IMPLANT
FORCEPS BPLR R/ABLATION 8 DVNC (INSTRUMENTS) ×2 IMPLANT
GAUZE 4X4 16PLY ~~LOC~~+RFID DBL (SPONGE) ×4 IMPLANT
GELPOINT ADV PLATFORM (ENDOMECHANICALS)
GLOVE ORTHO TXT STRL SZ7.5 (GLOVE) ×8 IMPLANT
GLOVE SURG SYN 6.5 ES PF (GLOVE) ×2 IMPLANT
GLOVE SURG SYN 6.5 PF PI (GLOVE) ×2 IMPLANT
GOWN STRL REUS W/ TWL LRG LVL3 (GOWN DISPOSABLE) ×10 IMPLANT
GOWN STRL REUS W/ TWL XL LVL3 (GOWN DISPOSABLE) ×4 IMPLANT
GOWN STRL REUS W/TWL LRG LVL3 (GOWN DISPOSABLE) ×10
GOWN STRL REUS W/TWL XL LVL3 (GOWN DISPOSABLE) ×4
GRASPER SUT TROCAR 14GX15 (MISCELLANEOUS) IMPLANT
GRASPER TIP-UP FEN DVNC XI (INSTRUMENTS) ×2 IMPLANT
GUIDEWIRE GREEN .038 145CM (MISCELLANEOUS) IMPLANT
GUIDEWIRE STR DUAL SENSOR (WIRE) IMPLANT
HANDLE YANKAUER SUCT BULB TIP (MISCELLANEOUS) IMPLANT
IRRIGATION STRYKERFLOW (MISCELLANEOUS) IMPLANT
IRRIGATOR STRYKERFLOW (MISCELLANEOUS)
IRRIGATOR SUCT 8 DISP DVNC XI (IRRIGATION / IRRIGATOR) IMPLANT
IV NS 1000ML (IV SOLUTION) ×2
IV NS 1000ML BAXH (IV SOLUTION) ×2 IMPLANT
IV NS IRRIG 3000ML ARTHROMATIC (IV SOLUTION) ×2 IMPLANT
KIT IMAGING PINPOINTPAQ (MISCELLANEOUS) ×4 IMPLANT
KIT PINK PAD W/HEAD ARE REST (MISCELLANEOUS) ×2
KIT PINK PAD W/HEAD ARM REST (MISCELLANEOUS) ×2 IMPLANT
KIT TURNOVER KIT A (KITS) ×2 IMPLANT
MANIFOLD NEPTUNE II (INSTRUMENTS) ×4 IMPLANT
NDL HYPO 22X1.5 SAFETY MO (MISCELLANEOUS) ×2 IMPLANT
NDL INSUFFLATION 14GA 120MM (NEEDLE) ×2 IMPLANT
NEEDLE HYPO 22X1.5 SAFETY MO (MISCELLANEOUS) ×2 IMPLANT
NEEDLE INSUFFLATION 14GA 120MM (NEEDLE) ×2 IMPLANT
NS IRRIG 500ML POUR BTL (IV SOLUTION) ×2 IMPLANT
PACK COLON CLEAN CLOSURE (MISCELLANEOUS) ×2 IMPLANT
PACK CYSTO AR (MISCELLANEOUS) ×2 IMPLANT
PACK LAP CHOLECYSTECTOMY (MISCELLANEOUS) ×2 IMPLANT
PAD PREP OB/GYN DISP 24X41 (PERSONAL CARE ITEMS) ×2 IMPLANT
PENCIL SMOKE EVACUATOR (MISCELLANEOUS) IMPLANT
PLATFORM STD W/COL CELL SVR (ENDOMECHANICALS) IMPLANT
RELOAD STAPLE 45 3.5 BLU DVNC (STAPLE) IMPLANT
RELOAD STAPLE 60 3.5 BLU DVNC (STAPLE) IMPLANT
RELOAD STAPLER 3.5X45 BLU DVNC (STAPLE) ×2 IMPLANT
RELOAD STAPLER 3.5X60 BLU DVNC (STAPLE) IMPLANT
RETRACTOR WOUND ALXS 18CM MED (MISCELLANEOUS) IMPLANT
RTRCTR WOUND ALEXIS O 18CM MED (MISCELLANEOUS)
SCISSORS METZENBAUM CVD 33 (INSTRUMENTS) IMPLANT
SCISSORS MNPLR CVD DVNC XI (INSTRUMENTS) ×2 IMPLANT
SEAL UNIV 5-12 XI (MISCELLANEOUS) ×8 IMPLANT
SEALER VESSEL EXT DVNC XI (MISCELLANEOUS) IMPLANT
SET CYSTO W/LG BORE CLAMP LF (SET/KITS/TRAYS/PACK) ×2 IMPLANT
SOL ELECTROSURG ANTI STICK (MISCELLANEOUS) ×2
SOL PREP PVP 2OZ (MISCELLANEOUS) ×2
SOLUTION ELECTROSURG ANTI STCK (MISCELLANEOUS) ×2 IMPLANT
SOLUTION PREP PVP 2OZ (MISCELLANEOUS) ×2 IMPLANT
SPIKE FLUID TRANSFER (MISCELLANEOUS) ×2 IMPLANT
SPONGE T-LAP 18X18 ~~LOC~~+RFID (SPONGE) ×2 IMPLANT
STAPLER 45 SUREFORM DVNC (STAPLE) IMPLANT
STAPLER 60 SUREFORM DVNC (STAPLE) IMPLANT
STAPLER CIRCULAR MANUAL XL 29 (STAPLE) IMPLANT
STAPLER RELOAD 3.5X45 BLU DVNC (STAPLE) ×2
STAPLER RELOAD 3.5X60 BLU DVNC (STAPLE)
STAPLER SKIN PROX 35W (STAPLE) IMPLANT
SURGILUBE 2OZ TUBE FLIPTOP (MISCELLANEOUS) ×4 IMPLANT
SUT DVC VLOC 90 3-0 CV23 VLT (SUTURE) ×2
SUT MNCRL 4-0 (SUTURE) ×4
SUT MNCRL 4-0 27XMFL (SUTURE) ×4
SUT PDS AB 0 CT1 27 (SUTURE) IMPLANT
SUT SILK 2 0 (SUTURE) ×2
SUT SILK 2-0 18XBRD TIE 12 (SUTURE) IMPLANT
SUT VIC AB 0 CT1 36 (SUTURE) IMPLANT
SUT VIC AB 0 SH 27 (SUTURE) IMPLANT
SUT VIC AB 3-0 SH 27 (SUTURE)
SUT VIC AB 3-0 SH 27X BRD (SUTURE) ×4 IMPLANT
SUTURE DVC VLC 90 3-0 CV23 VLT (SUTURE) ×2 IMPLANT
SUTURE MNCRL 4-0 27XMF (SUTURE) ×4 IMPLANT
SYR 10ML LL (SYRINGE) ×4 IMPLANT
SYR 20ML LL LF (SYRINGE) ×4 IMPLANT
SYR TOOMEY IRRIG 70ML (MISCELLANEOUS) ×2
SYRINGE TOOMEY IRRIG 70ML (MISCELLANEOUS) ×2 IMPLANT
SYS TROCAR 1.5-3 SLV ABD GEL (ENDOMECHANICALS) ×2
SYSTEM TROCR 1.5-3 SLV ABD GEL (ENDOMECHANICALS) IMPLANT
TRAP FLUID SMOKE EVACUATOR (MISCELLANEOUS) ×4 IMPLANT
TRAY FOLEY MTR SLVR 16FR STAT (SET/KITS/TRAYS/PACK) ×2 IMPLANT
TROCAR Z-THREAD FIOS 11X100 BL (TROCAR) IMPLANT
TROCAR Z-THREAD OPTICAL 5X100M (TROCAR) IMPLANT
TUBING EVAC SMOKE HEATED PNEUM (TUBING) ×2 IMPLANT
WATER STERILE IRR 1000ML POUR (IV SOLUTION) ×2 IMPLANT
WATER STERILE IRR 500ML POUR (IV SOLUTION) ×4 IMPLANT

## 2023-04-16 NOTE — Transfer of Care (Signed)
Immediate Anesthesia Transfer of Care Note  Patient: Maria Spencer  Procedure(s) Performed: XI ROBOT ASSISTED SIGMOID COLECTOMY (Abdomen) INDOCYANINE GREEN FLUORESCENCE IMAGING (ICG) (Bilateral: Ureter)  Patient Location: PACU  Anesthesia Type:General  Level of Consciousness: drowsy and patient cooperative  Airway & Oxygen Therapy: Patient Spontanous Breathing and Patient connected to face mask oxygen  Post-op Assessment: Report given to RN and Post -op Vital signs reviewed and stable  Post vital signs: Reviewed and stable  Last Vitals:  Vitals Value Taken Time  BP 123/68 04/16/23 1331  Temp    Pulse 61 04/16/23 1335  Resp 15 04/16/23 1335  SpO2 100 % 04/16/23 1335  Vitals shown include unvalidated device data.  Last Pain:  Vitals:   04/16/23 0622  TempSrc: Temporal  PainSc: 5          Complications: No notable events documented.

## 2023-04-16 NOTE — Interval H&P Note (Signed)
History and Physical Interval Note:  04/16/2023 7:23 AM  Maria Spencer  has presented today for surgery, with the diagnosis of diverticulitis.  The various methods of treatment have been discussed with the patient and family. After consideration of risks, benefits and other options for treatment, the patient has consented to  Procedure(s): XI ROBOT ASSISTED SIGMOID COLECTOMY (N/A) INDOCYANINE GREEN FLUORESCENCE IMAGING (ICG) (Bilateral) as a surgical intervention.  The patient's history has been reviewed, patient examined, no change in status, stable for surgery.  I have reviewed the patient's chart and labs.  Questions were answered to the patient's satisfaction.     Campbell Lerner

## 2023-04-16 NOTE — Interval H&P Note (Signed)
Patient was seen and examined this morning  Primary H&P by Dr. Claudine Mouton  Discussed retrograde injection of ICG dye into the ureter via cystoscopy  Risks including urinary tract infection, damage to the ureter or bladder amongst others were discussed.  All questions were answered.

## 2023-04-16 NOTE — Op Note (Signed)
Robotic sigmoid colectomy with low pelvic anastomosis, mobilization of splenic flexure.  Pre-operative Diagnosis: Sigmoid diverticulitis  Post-operative Diagnosis: same.    Surgeon: Campbell Lerner, M.D., FACS  Assistant: Lynden Oxford, PA-C An assistant was required for this procedure due to the complexity of low pelvic anastomosis. The assistant provided assistance in exposure, tissue manipulation, and was required for the successful and safe performance of the procedure. I performed the critical portions of the procedure.   Please note that Mr. Manus Rudd was scrubbed in for the entirety of the critical portion of this case.     Anesthesia: General endotracheal  Findings: Thickened sigmoid colon with scarring to itself and to the pelvic sidewalls filling the pelvis.  Definite transition to normal-appearing proximal colon and distal rectum.  Estimated Blood Loss: 150 mL         Specimens: Sigmoid colon          Complications: none              Procedure Details  The patient was seen again in the Holding Room. The benefits, complications, treatment options, and expected outcomes were discussed with the patient. The risks of bleeding, infection, recurrence of symptoms, failure to resolve symptoms, unanticipated injury, prosthetic placement, prosthetic infection, any of which could require further surgery were reviewed with the patient. The likelihood of improving the patient's symptoms with return to their baseline status is expected.  The patient and/or family concurred with the proposed plan, giving informed consent.  The patient was taken to Operating Room, identified and the procedure verified.    Prior to the induction of general anesthesia, antibiotic prophylaxis was administered. VTE prophylaxis was in place.  General anesthesia was then administered and tolerated well. After the induction, the patient was positioned in the lithotomy position, (Dr. Apolinar Junes completed her cystoscopy and  ICG instillation of the bilateral ureters), then the perineum was prepped with Betadine and the abdomen was prepped with  Chloraprep and draped in the sterile fashion.  A Time Out was held and the above information confirmed.  After local infiltration of quarter percent Marcaine with epinephrine, stab incision was made left upper quadrant.  Just below the costal margin approximately midclavicular line the Veress needle is passed with sensation of the layers to penetrate the abdominal wall and into the peritoneum.  Saline drop test is confirmed peritoneal placement.  Insufflation is initiated with carbon dioxide to pressures of 15 mmHg.  Right lower quadrant incision was made and an optical 11 mm trocar was placed into the peritoneal cavity under direct visualization.  I identified omentum incarcerated into a midline hernia just inferior to the umbilicus.  I felt we were able to deal with this after the robot was docked.  So I proceeded with placement of 3 additional 8.5 mm robotic trocars under direct visualization extending in a straight line, from the anterior superior iliac spine to the left costal margin in the epigastrium.  Under direct visualization I swapped out the 11 mm trocar for a 12 mm robotic trocar. I then made a Pfannenstiel incision at the suprapubic area, and placed a GelPort for later specimen extraction. Reduced the 1 cm ventral hernia, and the contents were primarily omentum of the transverse colon was tethered adjacent. We then began to normalize the pelvic anatomy, the sigmoid colon was not mobile, due to extensive adhesions to the left lower quadrant to the S loops of the sigmoid colon with deep within the pelvis.  The colon was markedly thickened, but we  were able to eventually mobilized the colon. I then proceeded to initiate retroperitoneal dissection, identifying the mesocolonic fat and attempted to find a plane between it and the retroperitoneal fat.  There is extensive reactive  appearing lymph nodes that were quite vascular and this added to the challenge of finding the correct plane.  I then abandoned the medial to lateral approach, and proceeded with a lateral mobilization of the sigmoid and left colon. I then continued this on up through the splenic flexure.  The left and right ureter were both well-visualized thanks to firefly. And was able to safely mobilize the left colon medially, I then created a mesenteric window and then proceeded with dividing the distal mesentery from that point distally. I was then able to mobilize the rectosigmoid junction and proceeded with the rectal mobilization by dividing the mesorectum utilizing with the vessel sealer. I then proceeded with transecting the rectum with a 45 mm robotic stapler.  Blue load, no issues with adequate compression.  Complete transection.  Then was able to mobilize the proximal rectum and sigmoid colon from the pelvis, ensuring adequate mobilization of the splenic flexure, by taking down the omental attachments and mobilizing the meso colon from the retroperitoneum from the left upper quadrant. Once I determine proximity to the disease process, and ensured adequate length to reach the pelvis, I proceeded with dividing the mesocolon with the vessel sealer to the point of intended transection.  Firefly confirmed adequate vascularity at the site.  I used the vessel sealer to divide the colon.  I selected a 29 mm EEA stapler, and we placed the anvil in through the GelPort site.  I then placed it within the distal colon, and secured it with a double pursestring of 3-0 V-Loc. Zach then joined me in the OR, dilated the rectum with the sequential sizers, and brought the 29 EEA stapler to the rectal cuff limit.  We then created an end-to-end anastomosis, confirming to adequate donuts, confirming and absence of tension, confirming adequate vascularity with firefly.  We then air tested the anastomosis utilizing pelvic irrigation, and  confirmed anastomosis to be both patent and without any leak for air. We then proceeded with extraction of the sponges the specimen within a Endo bag container, and needles.  We then irrigated the abdominal cavity with serial aliquots of normal saline solution confirming adequate hemostasis, and excellent irrigation and aspiration.  Approximately 3 L were utilized. I then removed the 12 mm port, closed the fascia with a 0 Vicryl under direct visualization utilizing PMI.  We then desufflated the abdomen removing the remaining ports.  Removed the GelPort, and closed the Pfannenstiel incision fascia with a running 0 Vicryl.  After irrigating all of the subcutaneous tissues from the incisions the skin was closed with running 4-0 subcuticular of Monocryl, and interrupted subcuticular's at the port sites.  All incisions were then sealed with Dermabond.  Overall the patient tolerated the procedure well.  Needle sponge and instrument counts all reported correct.  Claudication wound is clean contaminated.   Campbell Lerner M.D., [facility] 04/16/2023 1:41 PM

## 2023-04-16 NOTE — Op Note (Signed)
Date of procedure: 04/16/23  Preoperative diagnosis:  Diverticulitis  Postoperative diagnosis:  Same as above  Procedure: Cystoscopy Retrograde injection of ICG into the ureter for the purpose of ureteral identification bilaterally  Surgeon: Vanna Scotland, MD  Anesthesia: General  Complications: None  Intraoperative findings: Slight descent of the bladder otherwise unremarkable cystoscopy.  EBL: Minimal  Specimens: None  Drains: 16 French Foley catheter  Indication: Maria Spencer is a 77 y.o. patient with recurrent diverticulitis undergoing colectomy with Dr. Claudine Mouton.  Urology was asked to assist with injection of ICG for the purpose of ureteral identification..  After reviewing the management options for treatment, she elected to proceed with the above surgical procedure(s). We have discussed the potential benefits and risks of the procedure, side effects of the proposed treatment, the likelihood of the patient achieving the goals of the procedure, and any potential problems that might occur during the procedure or recuperation. Informed consent has been obtained.  Description of procedure:  The patient was taken to the operating room and general anesthesia was induced.  The patient was placed in the dorsal lithotomy position, prepped and draped in the usual sterile fashion, and preoperative antibiotics were administered. A preoperative time-out was performed.   A 21 French scope was advanced per urethra into the bladder.  Notably, there was some descent of the bladder but otherwise unremarkable.  Attention was turned to the right ureteral orifice.  It was intubated using a 5 [place] open-ended ureteral catheter which was slid approximately 15 cm.  I then slowly withdrew the catheter injecting 10 cc of ICG along the way.  The same exact procedure was performed on the left side as well.  There was E flux of green material from each of the UOs at the end of the procedure.  The scope  was removed.  A 16 French Foley catheter was placed in the balloon was filled with 10 cc of sterile water.  The patient was then repositioned in the remainder of the procedure was performed by Dr. Claudine Mouton.  Vanna Scotland, M.D.

## 2023-04-16 NOTE — Anesthesia Procedure Notes (Signed)
Procedure Name: Intubation Date/Time: 04/16/2023 7:49 AM  Performed by: Jeannene Patella, CRNAPre-anesthesia Checklist: Patient identified, Timeout performed, Emergency Drugs available, Suction available and Patient being monitored Patient Re-evaluated:Patient Re-evaluated prior to induction Oxygen Delivery Method: Circle system utilized Preoxygenation: Pre-oxygenation with 100% oxygen Induction Type: IV induction Ventilation: Mask ventilation with difficulty and Oral airway inserted - appropriate to patient size Laryngoscope Size: McGraph and 4 Grade View: Grade III Tube type: Oral Tube size: 7.0 mm Number of attempts: 1 Airway Equipment and Method: Stylet and Video-laryngoscopy Placement Confirmation: ETT inserted through vocal cords under direct vision, positive ETCO2 and breath sounds checked- equal and bilateral Secured at: 20 (at lip) cm Dental Injury: Teeth and Oropharynx as per pre-operative assessment  Future Recommendations: Recommend- induction with short-acting agent, and alternative techniques readily available Comments: Larynx anterior view not improved with cricoid pressure

## 2023-04-16 NOTE — Anesthesia Preprocedure Evaluation (Addendum)
Anesthesia Evaluation  Patient identified by MRN, date of birth, ID band Patient awake    Reviewed: Allergy & Precautions, NPO status , Patient's Chart, lab work & pertinent test results  Airway Mallampati: II  TM Distance: >3 FB Neck ROM: Full    Dental no notable dental hx. (+) Dental Advidsory Given, Teeth Intact   Pulmonary neg pulmonary ROS, neg COPD   Pulmonary exam normal        Cardiovascular hypertension, Pt. on home beta blockers and Pt. on medications Normal cardiovascular exam  ECHO 4/24:  1. Left ventricular ejection fraction, by estimation, is 60 to 65%. The  left ventricle has normal function. The left ventricle has no regional  wall motion abnormalities. Left ventricular diastolic parameters are  consistent with Grade I diastolic  dysfunction (impaired relaxation).   2. Right ventricular systolic function is normal. The right ventricular  size is normal.   3. The mitral valve is normal in structure. Mild mitral valve  regurgitation.   4. The aortic valve is tricuspid. Aortic valve regurgitation is not  visualized.   5. The inferior vena cava is normal in size with greater than 50%  respiratory variability, suggesting right atrial pressure of 3 mmHg.     Neuro/Psych  Headaches  Neuromuscular disease  negative psych ROS   GI/Hepatic Neg liver ROS,,,Diverticulitis with LLQ pain   Endo/Other  Hypothyroidism    Renal/GU negative Renal ROS     Musculoskeletal  (+) Arthritis , Osteoarthritis,  Spinal fusion x 2 Scoliosis surgery   Abdominal   Peds  Hematology negative hematology ROS (+)   Anesthesia Other Findings Past Medical History: No date: Arthritis     Comment:  neck, back, left knee;  2009: Breast cancer     Comment:  right lumpectomy s/p radiation x 1 week 2x per day only  No date: Chicken pox No date: Chronic UTI     Comment:  established with urology  No date: COVID-19     Comment:  ?  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  No date: Diverticulitis No date: Esophagitis     Comment:  egd 05/17/14 see report scanned into chart No date: GERD (gastroesophageal reflux disease)     Comment:  h/o No date: History of kidney stones No date: Hormone disorder No date: Hyperlipidemia No date: Hypertension     Comment:  controlled well with medication;  No date: Hypothyroidism No date: LVH (left ventricular hypertrophy)     Comment:  mild No date: MR (mitral regurgitation) No date: Osteoporosis No date: PAC (premature atrial contraction) 2009: Personal history of radiation therapy     Comment:  F/U right breast cancer No date: PSVT (paroxysmal supraventricular tachycardia) No date: PVC (premature ventricular contraction) No date: Sepsis No date: Thyroid disease     Comment:  hypothyroidism   Past Surgical History: 2018: BONE GRAFT HIP ILIAC CREST     Comment:  + cage left hip 10/23/17  2009: BREAST BIOPSY; Left     Comment:  clip,benign 2009: BREAST BIOPSY; Right     Comment:  + 2009: BREAST LUMPECTOMY; Right     Comment:  2009 lumpectomy  2001: CARDIAC CATHETERIZATION     Comment:  No stents;  results were WNL Dr. requested it for HTN 10/2019: CATARACT EXTRACTION, BILATERAL; Bilateral 05/15/2020: COLONOSCOPY WITH PROPOFOL; N/A     Comment:  Procedure: COLONOSCOPY WITH PROPOFOL;  Surgeon: Allegra Lai,  Loel Dubonnet, MD;  Location: [facility];  Service:               Gastroenterology;  Laterality: N/A; 05/29/2018: INSERTION OF MESH; N/A     Comment:  Procedure: INSERTION OF MESH;  Surgeon: Luretha Murphy,              MD;  Location: [facility];  Service: General;  Laterality:               N/A; 12/05/2020: QUADRICEPS TENDON REPAIR; Right     Comment:  Procedure: OPEN REPAIR OF RIGHT GLUTEUS MINIMUS TENDON;               Surgeon: Christena Flake, MD;  Location: [facility];                Service:  Orthopedics;  Laterality: Right; No date: right gluteus medius/minimus repair Dr. Joice Lofts 11/2020      Comment:  Dr. Joice Lofts 2008, 2009: SHOULDER SURGERY; Left     Comment:  x2 rotator cuff surgeries left shoulders 07/24/2020: SPINAL FUSION 10/23/2017: SPINE SURGERY     Comment:  spinal 09/2017 h/o scoliosis [facility] L4-S1 OLIF T10 to               ililum fusion No date: TONSILLECTOMY     Comment:  age 53 y.o.  03/18/2022: TOTAL KNEE ARTHROPLASTY; Left     Comment:  Procedure: TOTAL KNEE ARTHROPLASTY;  Surgeon:               Joen Laura, MD;  Location: [facility];  Service:               Orthopedics;  Laterality: Left; 2012: TOTAL SHOULDER REPLACEMENT; Right No date: TUBAL LIGATION     Comment:  age 66 05/29/2018: VENTRAL HERNIA REPAIR; N/A     Comment:  Procedure: LAPAROSCOPIC VENTRAL / LATERAL HERNIA REPAIR;              Surgeon: Luretha Murphy, MD;  Location: [facility];                Service: General;  Laterality: N/A;  BMI    Body Mass Index: 27.27 kg/m      Reproductive/Obstetrics negative OB ROS                             Anesthesia Physical Anesthesia Plan  ASA: 2  Anesthesia Plan: General   Post-op Pain Management: Tylenol PO (pre-op)*, Gabapentin PO (pre-op)* and Toradol IV (intra-op)*   Induction: Intravenous  PONV Risk Score and Plan: 3 and Ondansetron, Dexamethasone and Treatment may vary due to age or medical condition  Airway Management Planned: Oral ETT  Additional Equipment:   Intra-op Plan:   Post-operative Plan: Extubation in OR  Informed Consent: I have reviewed the patients History and Physical, chart, labs and discussed the procedure including the risks, benefits and alternatives for the proposed anesthesia with the patient or authorized representative who has indicated his/her understanding and acceptance.     Dental advisory given  Plan Discussed with: CRNA  Anesthesia Plan Comments:         Anesthesia Quick  Evaluation

## 2023-04-17 LAB — BASIC METABOLIC PANEL
Anion gap: 7 (ref 5–15)
BUN: 9 mg/dL (ref 8–23)
CO2: 21 mmol/L — ABNORMAL LOW (ref 22–32)
Calcium: 7.3 mg/dL — ABNORMAL LOW (ref 8.9–10.3)
Chloride: 102 mmol/L (ref 98–111)
Creatinine, Ser: 0.62 mg/dL (ref 0.44–1.00)
GFR, Estimated: >60 mL/min (ref >60)
Glucose, Bld: 115 mg/dL — ABNORMAL HIGH (ref 70–99)
Potassium: 3.7 mmol/L (ref 3.5–5.1)
Sodium: 130 mmol/L — ABNORMAL LOW (ref 135–145)

## 2023-04-17 LAB — CBC
HCT: 33.7 % — ABNORMAL LOW (ref 36.0–46.0)
Hemoglobin: 10.9 g/dL — ABNORMAL LOW (ref 12.0–15.0)
MCH: 27.5 pg (ref 26.0–34.0)
MCHC: 32.3 g/dL (ref 30.0–36.0)
MCV: 85.1 fL (ref 80.0–100.0)
Platelets: 190 10*3/uL (ref 150–400)
RBC: 3.96 MIL/uL (ref 3.87–5.11)
RDW: 14.6 % (ref 11.5–15.5)
WBC: 11.8 10*3/uL — ABNORMAL HIGH (ref 4.0–10.5)
nRBC: 0 % (ref 0.0–0.2)

## 2023-04-17 MED ORDER — CHLORHEXIDINE GLUCONATE CLOTH 2 % EX PADS
6.0000 | MEDICATED_PAD | Freq: Every day | CUTANEOUS | Status: DC
  Administered 2023-04-17: 6 via TOPICAL

## 2023-04-17 NOTE — Progress Notes (Signed)
[facility] SURGICAL PROGRESS NOTE  Hospital Day(s): 1.   Post op day(s): 1 Day Post-Op.   Interval History:  Patient seen and examined No acute events or new complaints overnight.  Patient reports she is sore this morning but otherwise doing well No fever, chills, nausea, emesis  Mild leukocytosis to 11.8K; likely reactive Hgb to 10.9 Renal function normal; sCr - 0.62; UO - 925 ccs Mild hyponatremia to 130 o/w no electrolyte derangements On CLD No flatus Was able to stand out of bed this morning   Vital signs in last 24 hours: [min-max] current  Temp:  [96.8 F (36 C)-98.7 F (37.1 C)] 98.7 F (37.1 C) (05/22 2254) Pulse Rate:  [56-85] 82 (05/22 2254) Resp:  [7-25] 16 (05/22 2254) BP: (106-134)/(55-79) 118/55 (05/22 2254) SpO2:  [83 %-100 %] 94 % (05/22 2254)     Height: 4\' 11"  (149.9 cm) Weight: 61.2 kg BMI (Calculated): 27.25   Intake/Output last 2 shifts:  05/22 0701 - 05/23 0700 In: 1789.1 [P.O.:35; I.V.:1554.1; IV Piggyback:200] Out: 1075 [Urine:925; Blood:150]   Physical Exam:  Constitutional: alert, cooperative and no distress  Respiratory: breathing non-labored at rest  Cardiovascular: regular rate and sinus rhythm  Gastrointestinal: Soft, incisional soreness, non-distended, no rebound/guarding Genitourinary: Foley in place; clear urine Integumentary: Laparoscopic incisions are CDI with dermabond, no erythema or drainage   Labs:     Latest Ref Rng & Units 04/17/2023    3:46 AM 03/06/2023   10:52 AM 05/23/2022   11:15 AM  CBC  WBC 4.0 - 10.5 K/uL 11.8  5.5  5.5   Hemoglobin 12.0 - 15.0 g/dL 53.6  64.4  03.4   Hematocrit 36.0 - 46.0 % 33.7  41.5  41.4   Platelets 150 - 400 K/uL 190  325  227.0       Latest Ref Rng & Units 04/17/2023    3:46 AM 03/06/2023   10:52 AM 03/19/2022    3:16 AM  CMP  Glucose 70 - 99 mg/dL 742  595  638   BUN 8 - 23 mg/dL 9  11  12    Creatinine 0.44 - 1.00 mg/dL 7.56  4.33  2.95   Sodium 135 - 145 mmol/L 130   138  136   Potassium 3.5 - 5.1 mmol/L 3.7  3.6  3.9   Chloride 98 - 111 mmol/L 102  102  104   CO2 22 - 32 mmol/L 21  27  24    Calcium 8.9 - 10.3 mg/dL 7.3  8.9  8.4      Imaging studies: No new pertinent imaging studies   Assessment/Plan: 77 y.o. female 1 Day Post-Op s/p laparoscopic sigmoid colectomy for complicated diverticulitis   - Continue CLD for now - Continue IVF support  - Discontinue foley catheter    - Continue entereg until ROBF  - Monitor abdominal examination; on-going bowel function  - Pain control prn; antiemetics prn  - Monitor leukocytosis; morning labs  - Mobilize; engage therapy  All of the above findings and recommendations were discussed with the patient, and the medical team, and all of patient's questions were answered to his expressed satisfaction.  -- Lynden Oxford, PA-C [facility] 04/17/2023, 7:31 AM M-F: 7am - 4pm

## 2023-04-17 NOTE — TOC Initial Note (Addendum)
Transition of Care [facility]) - Initial/Assessment Note    Patient Details  Name: Maria Spencer MRN: [id number] Date of Birth: 1946-01-30  [facility] CM/SW Contact:    Margarito Liner, LCSW Phone Number: 04/17/2023, 11:41 AM  Clinical Narrative:  CSW met with patient. No supports at bedside. CSW introduced role and explained that therapy recommendations would be discussed. Patient is agreeable to home health. She reviewed CMS scores for agencies that serve her zip code. First preference is [place]. Left message for liaison to see if they can accept. No further concerns. CSW encouraged patient to contact CSW as needed. CSW will continue to follow patient for support and facilitate return home once stable. Her brother-in-law will transport her home when stable.            11:55 am: Iantha Fallen has accepted referral for PT and OT.  Expected Discharge Plan: Home w Home Health Services Barriers to Discharge: Continued Medical Work up   Patient Goals and CMS Choice   CMS Medicare.gov Compare Post Acute Care list provided to:: Patient        Expected Discharge Plan and Services     Post Acute Care Choice: Home Health Living arrangements for the past 2 months: Single Family Home                                      Prior Living Arrangements/Services Living arrangements for the past 2 months: Single Family Home Lives with:: Self Patient language and need for interpreter reviewed:: Yes Do you feel safe going back to the place where you live?: Yes      Need for Family Participation in Patient Care: Yes (Comment)     Criminal Activity/Legal Involvement Pertinent to Current Situation/Hospitalization: No - Comment as needed  Activities of Daily Living Home Assistive Devices/Equipment: Cane (specify quad or straight) ADL Screening (condition at time of admission) Patient's cognitive ability adequate to safely complete daily activities?: Yes Is the patient deaf or have  difficulty hearing?: No Does the patient have difficulty seeing, even when wearing glasses/contacts?: No Does the patient have difficulty concentrating, remembering, or making decisions?: No Patient able to express need for assistance with ADLs?: Yes Does the patient have difficulty dressing or bathing?: No Independently performs ADLs?: Yes (appropriate for developmental age) Does the patient have difficulty walking or climbing stairs?: Yes Weakness of Legs: Both Weakness of Arms/Hands: None  Permission Sought/Granted Permission sought to share information with : Facility Industrial/product designer granted to share information with : Yes, Verbal Permission Granted     Permission granted to share info w AGENCY: Home health agencies        Emotional Assessment Appearance:: Appears stated age Attitude/Demeanor/Rapport: Engaged, Gracious Affect (typically observed): Accepting, Appropriate, Calm, Pleasant Orientation: : Oriented to Self, Oriented to Place, Oriented to  Time, Oriented to Situation Alcohol / Substance Use: Not Applicable Psych Involvement: No (comment)  Admission diagnosis:  Sigmoid diverticulitis [K57.32] Patient Active Problem List   Diagnosis Date Noted   Sigmoid diverticulitis 04/16/2023   Cervical facet joint pain 03/25/2023   Failed orthopedic implant (HCC) 03/12/2023   S/P spinal fusion 03/12/2023   S/P hardware removal 03/12/2023   Pharyngitis 12/17/2022   Acute recurrent maxillary sinusitis 12/17/2022   Weakness of both lower extremities 06/26/2022   Localized osteoarthritis of left knee 03/18/2022   Left lateral knee pain 10/11/2021   Partial tear of right  hamstring 09/25/2021   Diverticulitis 03/23/2021   Osteoarthritis of knee (Left) 03/05/2021   Prediabetes 02/06/2021   Chronic bursitis of shoulder area (Right) 11/09/2020   Chronic shoulder pain (Right) 11/09/2020   Shoulder blade pain (Right) 11/09/2020   Scoliosis of thoracic spine,  unspecified scoliosis type 11/08/2020   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/20/2017) 11/08/2020   Spondylosis without myelopathy or radiculopathy, cervical region 10/26/2020   Abnormal MRI, cervical spine (08/06/2022) 10/23/2020   Cervical facet syndrome (Bilateral) 10/23/2020   Cervicogenic headache (Bilateral) 10/23/2020   Occipital neuralgia (greater occipital nerve) (Bilateral) 10/23/2020   Cervico-occipital neuralgia (Bilateral) 10/23/2020   Cervical facet hypertrophy (Multilevel) (Bilateral) 10/23/2020   Tear of right gluteus minimus tendon 10/06/2020   Tear of right gluteus medius tendon 10/06/2020   Osteoarthritis of AC (acromioclavicular) joints (Bilateral) 10/05/2020   Chronic Acromioclavicular (AC) joint pain (Bilateral) 10/05/2020   Osteoarthritis of glenohumeral joint (Left) 10/05/2020   Chronic shoulder pain (Bilateral) (R>L) 10/02/2020   Chronic shoulder pain after shoulder replacement (Right) 10/02/2020   Cervicalgia 09/13/2020   Chronic upper extremity pain (Right) 09/13/2020   Cervical spondylosis with radiculopathy (Right) 09/13/2020   Cervical radiculopathy at C7 (Right) 09/13/2020   Atypical chest pain 07/13/2020   Diverticular disease 07/13/2020   Shortness of breath 07/13/2020   Palpitations 07/13/2020   GERD (gastroesophageal reflux disease) 07/13/2020   Insomnia 07/11/2020   Breast cancer (HCC) 06/22/2020   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) 06/22/2020   DDD (degenerative disc disease), cervical 06/22/2020   DDD (degenerative disc disease), thoracic 06/22/2020   DDD (degenerative disc disease), lumbar 06/22/2020   Displacement of thoracic intervertebral disc (C6-T3, T4-5, T6-7, T8-9) 06/22/2020   Non-traumatic compression fracture of T8 thoracic vertebra, sequela 06/22/2020   Spondylosis without myelopathy or radiculopathy, thoracic region 05/11/2020   Closed wedge compression fracture of T8 vertebra (HCC) 05/11/2020    Osteoarthritis of hips (Bilateral) 05/11/2020   Abnormal thoracolumbar CT myelogram (01/05/2020) 05/11/2020   Chronic pain syndrome 05/08/2020   Colon cancer screening 05/08/2020   Disorder of skeletal system 05/08/2020   Problems influencing health status 05/08/2020   Chronic lower extremity pain (3ry area of Pain) (Right) 05/08/2020   Chronic knee pain (4th area of Pain) (Posterior) (Left) 05/08/2020   Thoracic facet syndrome (Bilateral) 05/08/2020   Chronic sacroiliac joint pain (Bilateral) 05/08/2020   Osteoporosis 01/14/2020   Chronic low back pain with sciatica 01/14/2020   Chronic thoracic back pain (1ry area of Pain) (Bilateral) (R>L) 01/14/2020   Fusion of spine of lumbosacral region 12/13/2019   Lumbosacral spondylosis with radiculopathy 12/13/2019   S/P LASIK (laser assisted in situ keratomileusis) of both eyes 11/01/2019   Aortic atherosclerosis (HCC) 09/17/2019   Chronic hip pain (2ry area of Pain) (Bilateral) (R>L) 09/13/2019   Age-related nuclear cataract of right eye 08/19/2019   Age-related nuclear cataract of left eye 08/19/2019   Recurrent incisional hernia with incarceration & SBO s/p reduction 05/28/2018   Arthritis 01/07/2018   Chronic UTI 12/31/2017   IBS (irritable bowel syndrome) 12/29/2017   History of breast cancer 12/29/2017   Baker's cyst of knee (Left) 12/29/2017   Scoliosis of cervical region due to degenerative disease of spine in adult 09/18/2017   Hypothyroidism 02/15/2013   Hyperlipidemia 02/15/2013   Elevated blood pressure reading with diagnosis of hypertension 02/15/2013   Essential hypertension 02/15/2013   PCP:  Dana Allan, MD Pharmacy:   [id number] - [place], [place] - [address] [place]  [address] Phone: [phone] Fax: [phone]  [facility] - [place], [place] - [address] [place] [address] Phone: [phone] Fax:  [phone]     Social Determinants of Health (SDOH) Social History: SDOH Screenings   Food Insecurity: No Food Insecurity (04/16/2023)  Housing: Low Risk  (04/16/2023)  Recent Concern: Housing - Medium Risk (03/05/2023)  Transportation Needs: No Transportation Needs (04/16/2023)  Utilities: Not At Risk (04/16/2023)  Alcohol Screen: Low Risk  (03/05/2023)  Depression (PHQ2-9): Low Risk  (02/05/2023)  Recent Concern: Depression (PHQ2-9) - Medium Risk (12/17/2022)  Financial Resource Strain: Low Risk  (03/05/2023)  Physical Activity: Unknown (03/05/2023)  Social Connections: Moderately Integrated (03/05/2023)  Stress: Stress Concern Present (03/05/2023)  Tobacco Use: Low Risk  (04/16/2023)   SDOH Interventions:     Readmission Risk Interventions     No data to display

## 2023-04-17 NOTE — Evaluation (Addendum)
Occupational Therapy Evaluation Patient Details Name: Maria Spencer MRN: [id number] DOB: 10-17-1946 Today's Date: 04/17/2023   History of Present Illness 77 y.o. female 1 Day Post-Op s/p laparoscopic sigmoid colectomy for complicated diverticulitis. PMH includesL HLD, OA, HTN, hypothyroid, spinal fusion, R gludeus medius repair,   Clinical Impression   Patient received for OT evaluation. See flowsheet below for details of function. Pt received in chair; at end of session left supine in bed. Generally, patient requiring cues and education for log roll technique for bed mobility, SBA with youth RW for functional mobility, and MOD (I)-SBA for ADLs. Pt able to walk approx 3 minutes at moderate pace with RW in hallway, as well as use the bathroom. Education and problem-solving around ADLs at home provided. Pt acknowledging. 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 Patient will benefit from continued OT while in acute care.       Recommendations for follow up therapy are one component of a multi-disciplinary discharge planning process, led by the attending physician.  Recommendations may be updated based on patient status, additional functional criteria and insurance authorization.   Assistance Recommended at Discharge Set up Supervision/Assistance  Patient can return home with the following A little help with walking and/or transfers;A little help with bathing/dressing/bathroom;Assistance with cooking/housework;Direct supervision/assist for medications management;Direct supervision/assist for financial management;Assist for transportation    Functional Status Assessment  Patient has had a recent decline in their functional status and demonstrates the ability to make significant improvements in function in a reasonable and predictable amount of time.  Equipment Recommendations  Other (comment)  (long-handled sponge)    Recommendations for Other Services       Precautions / Restrictions Precautions Precautions: Fall Restrictions Weight Bearing Restrictions: No      Mobility Bed Mobility Overal bed mobility: Modified Independent             General bed mobility comments: OT taught pt how to do log roll technique and she was able to practice twice and stated that she feels much better, decreased pain with log roll    Transfers Overall transfer level: Needs assistance Equipment used: Rolling walker (2 wheels) Transfers: Sit to/from Stand Sit to Stand: Supervision                  Balance Overall balance assessment: Mild deficits observed, not formally tested                                         ADL either performed or assessed with clinical judgement   ADL Overall ADL's : Needs assistance/impaired Eating/Feeding: Set up   Grooming: Wash/dry hands;Supervision/safety;Standing         Lower Body Bathing Details (indicate cue type and reason): encouraged pt to use shower chair at home; would benefit from long-handled sponge       Lower Body Dressing Details (indicate cue type and reason): discussed AE that pt has at home; she states that she knows how to use it for LB dressing; discussed that she would benefit from AE to protect abdominal area Toilet Transfer: Supervision/safety;Regular Teacher, adult education Details (indicate cue type and reason): using RW; needing cues to use grab bar   Toileting - Clothing Manipulation Details (indicate cue type and reason): pt wearing hospital gown today  Tub/Shower Transfer Details (indicate cue type and reason): encouraged pt to use shower chair she has at home; advised her to check with MD and nursing about covering wounds prior to showering Functional mobility during ADLs: Supervision/safety;Rolling walker (2 wheels) (youth RW) General ADL Comments: Pt comfortable with AE/DME she has at  home; should do well     Vision Ability to See in Adequate Light: 0 Adequate Patient Visual Report: No change from baseline       Perception     Praxis      Pertinent Vitals/Pain Pain Assessment Pain Assessment: 0-10 Pain Score: 7  Pain Location: abdomen Pain Descriptors / Indicators: Discomfort, Sore Pain Intervention(s): Limited activity within patient's tolerance, Monitored during session     Hand Dominance Right   Extremity/Trunk Assessment Upper Extremity Assessment Upper Extremity Assessment: Overall WFL for tasks assessed   Lower Extremity Assessment Lower Extremity Assessment: Defer to PT evaluation;Generalized weakness (pt states that at home she isn't able to do much more than walk inside her house; outside her house is hilly and she has hip/knee issues)   Cervical / Trunk Assessment Cervical / Trunk Assessment: Normal   Communication Communication Communication: No difficulties   Cognition Arousal/Alertness: Awake/alert Behavior During Therapy: WFL for tasks assessed/performed Overall Cognitive Status: Within Functional Limits for tasks assessed                                 General Comments: Very pleasant and motivated     General Comments  On room air.    Exercises     Shoulder Instructions      Home Living Family/patient expects to be discharged to:: Private residence (condo) Living Arrangements: Alone Available Help at Discharge: Available PRN/intermittently;Friend(s);Other (Comment) (states that her neighbor can help with IADLs; children live out of state) Type of Home: House Home Access: Level entry     Home Layout: One level     Bathroom Shower/Tub: Producer, television/film/video: Standard (has BSC available and states she'll get her neighbor to put it over toilet.)     Home Equipment: Rolling Walker (2 wheels);Cane - single point;Grab bars - tub/shower;BSC/3in1;Shower seat;Other (comment) (adjustable bed, lift  recliner chair, reacher, sock aid, maybe long-handled shoe horn)   Additional Comments: adjustable bed, lift chair      Prior Functioning/Environment Prior Level of Function : Independent/Modified Independent;Driving             Mobility Comments: uses SPC or RW at home (interchanges them); denies falls recently; did have a fall a few years ago resulting in hip injury to tendons; states she's awaiting surgery on that, as well as TKA. ADLs Comments: independent; pt states she has prepared frozen meals; neighbor can help with groceries, laundry, taking trash out, and getting mail        OT Problem List: Decreased range of motion;Decreased activity tolerance;Impaired balance (sitting and/or standing)      OT Treatment/Interventions: Self-care/ADL training;Therapeutic exercise;Therapeutic activities;DME and/or AE instruction;Patient/family education    OT Goals(Current goals can be found in the care plan section) Acute Rehab OT Goals Patient Stated Goal: Get better; go home OT Goal Formulation: With patient Time For Goal Achievement: 05/01/23 Potential to Achieve Goals: Good ADL Goals Pt Will Perform Grooming: with modified independence;standing Pt Will Perform Lower Body Dressing: with modified independence;with adaptive equipment;sit to/from stand Pt Will Transfer to Toilet: with modified independence;regular height toilet;ambulating Pt Will Perform Toileting -  Clothing Manipulation and hygiene: with modified independence;sit to/from stand  OT Frequency: Min 2X/week    Co-evaluation              AM-PAC OT "6 Clicks" Daily Activity     Outcome Measure Help from another person eating meals?: None Help from another person taking care of personal grooming?: A Little Help from another person toileting, which includes using toliet, bedpan, or urinal?: A Little Help from another person bathing (including washing, rinsing, drying)?: A Little Help from another person to put on  and taking off regular upper body clothing?: None Help from another person to put on and taking off regular lower body clothing?: A Little 6 Click Score: 20   End of Session Equipment Utilized During Treatment: Rolling walker (2 wheels) Nurse Communication: Mobility status  Activity Tolerance: Patient tolerated treatment well Patient left: in bed;with call bell/phone within reach;with bed alarm set  OT Visit Diagnosis: Muscle weakness (generalized) (M62.81)                Time: 1610-9604 OT Time Calculation (min): 31 min Charges:  OT General Charges $OT Visit: 1 Visit OT Evaluation $OT Eval Moderate Complexity: 1 Mod OT Treatments $Self Care/Home Management : 8-22 mins  Linward Foster, MS, OTR/L  Alvester Morin 04/17/2023, 11:30 AM

## 2023-04-17 NOTE — Anesthesia Postprocedure Evaluation (Signed)
Anesthesia Post Note  Patient: Maria Spencer  Procedure(s) Performed: XI ROBOT ASSISTED SIGMOID COLECTOMY (Abdomen) INDOCYANINE GREEN FLUORESCENCE IMAGING (ICG) (Bilateral: Ureter)  Patient location during evaluation: PACU Anesthesia Type: General Level of consciousness: awake and alert Pain management: pain level controlled Vital Signs Assessment: post-procedure vital signs reviewed and stable Respiratory status: spontaneous breathing, nonlabored ventilation, respiratory function stable and patient connected to nasal cannula oxygen Cardiovascular status: blood pressure returned to baseline and stable Postop Assessment: no apparent nausea or vomiting Anesthetic complications: no   No notable events documented.   Last Vitals:  Vitals:   04/16/23 2254 04/17/23 0935  BP: (!) 118/55 (!) 116/53  Pulse: 82 75  Resp: 16 14  Temp: 37.1 C   SpO2: 94% 100%    Last Pain:  Vitals:   04/17/23 0313  TempSrc:   PainSc: Asleep                 Foye Deer

## 2023-04-17 NOTE — Evaluation (Signed)
Physical Therapy Evaluation Patient Details Name: Maria Spencer MRN: [id number] DOB: [birth year] Today's Date: 04/17/2023  History of Present Illness  77 y.o. female 1 Day Post-Op s/p laparoscopic sigmoid colectomy for complicated diverticulitis. PMH includesL HLD, OA, HTN, hypothyroid, spinal fusion, R gludeus medius repair,  Clinical Impression  Patient received in bed, she is pleasant and agreeable to PT assessment. Patient is mod I with bed mobility, increased time and effort needed due to pain. She transfers with supervision and ambulated with SPC 100 feet. Min A needed for safety and use of rail in hallway for stabilization. Patient reports feeling weak with gait. She will continue to benefit from skilled PT to improve strength, independence and safety with mobility.         Recommendations for follow up therapy are one component of a multi-disciplinary discharge planning process, led by the attending physician.  Recommendations may be updated based on patient status, additional functional criteria and insurance authorization.  Follow Up Recommendations       Assistance Recommended at Discharge Intermittent Supervision/Assistance  Patient can return home with the following  A little help with walking and/or transfers;A little help with bathing/dressing/bathroom;Assist for transportation;Assistance with cooking/housework;Help with stairs or ramp for entrance    Equipment Recommendations None recommended by PT (patient has walker at home)  Recommendations for Other Services       Functional Status Assessment Patient has had a recent decline in their functional status and demonstrates the ability to make significant improvements in function in a reasonable and predictable amount of time.     Precautions / Restrictions Precautions Precautions: Fall Restrictions Weight Bearing Restrictions: No      Mobility  Bed Mobility Overal bed mobility: Modified Independent              General bed mobility comments: increased time, effort needed, pain    Transfers Overall transfer level: Needs assistance Equipment used: None Transfers: Sit to/from Stand Sit to Stand: Supervision                Ambulation/Gait Ambulation/Gait assistance: Min guard, Min assist Gait Distance (Feet): 100 Feet Assistive device: Straight cane Gait Pattern/deviations: Step-through pattern, Decreased step length - right, Decreased step length - left, Decreased stride length Gait velocity: decr     General Gait Details: patient mildly unsteady with mobility, reports feeling weak. Holding to rail out in hall in addition to [facility].  Stairs            Wheelchair Mobility    Modified Rankin (Stroke Patients Only)       Balance Overall balance assessment: Needs assistance, Mild deficits observed, not formally tested Sitting-balance support: Feet supported Sitting balance-Leahy Scale: Good     Standing balance support: Bilateral upper extremity supported, During functional activity, Reliant on assistive device for balance Standing balance-Leahy Scale: Poor Standing balance comment: would benefit from using RW                             Pertinent Vitals/Pain Pain Assessment Pain Assessment: Faces Faces Pain Scale: Hurts even more Pain Location: abdomen Pain Descriptors / Indicators: Discomfort, Sore Pain Intervention(s): Monitored during session, Repositioned, Other (comment) (instructed patient to use pillow to brace when needed)    Home Living Family/patient expects to be discharged to:: Private residence (lives in [place]) Living Arrangements: Alone Available Help at Discharge: Available PRN/intermittently;Friend(s);Other (Comment) (Children live in [place] and [place]) Type of Home: House Home Access: Level  entry       Home Layout: One level Home Equipment: Agricultural consultant (2 wheels);Cane - single point Additional Comments: adjustable bed, lift chair     Prior Function Prior Level of Function : Independent/Modified Independent;Driving             Mobility Comments: SPC ADLs Comments: independent     Hand Dominance   Dominant Hand: Right    Extremity/Trunk Assessment   Upper Extremity Assessment Upper Extremity Assessment: Defer to OT evaluation    Lower Extremity Assessment Lower Extremity Assessment: Generalized weakness    Cervical / Trunk Assessment Cervical / Trunk Assessment: Normal  Communication   Communication: No difficulties  Cognition Arousal/Alertness: Awake/alert Behavior During Therapy: WFL for tasks assessed/performed Overall Cognitive Status: Within Functional Limits for tasks assessed                                          General Comments      Exercises     Assessment/Plan    PT Assessment Patient needs continued PT services  PT Problem List Decreased strength;Decreased activity tolerance;Decreased balance;Decreased mobility;Decreased safety awareness;Decreased knowledge of use of DME;Pain       PT Treatment Interventions DME instruction;Gait training;Therapeutic exercise;Balance training;Stair training;Functional mobility training;Therapeutic activities;Patient/family education    PT Goals (Current goals can be found in the Care Plan section)  Acute Rehab PT Goals Patient Stated Goal: to improve and return home PT Goal Formulation: With patient Time For Goal Achievement: 04/25/23 Potential to Achieve Goals: Good    Frequency Min 4X/week     Co-evaluation               AM-PAC PT "6 Clicks" Mobility  Outcome Measure Help needed turning from your back to your side while in a flat bed without using bedrails?: A Little Help needed moving from lying on your back to sitting on the side of a flat bed without using bedrails?: A Little Help needed moving to and from a bed to a chair (including a wheelchair)?: A Little Help needed standing up from a chair using  your arms (e.g., wheelchair or bedside chair)?: A Little Help needed to walk in hospital room?: A Little Help needed climbing 3-5 steps with a railing? : A Lot 6 Click Score: 17    End of Session Equipment Utilized During Treatment: Gait belt Activity Tolerance: Patient limited by pain;Patient limited by fatigue Patient left: in chair;with call bell/phone within reach Nurse Communication: Mobility status PT Visit Diagnosis: Unsteadiness on feet (R26.81);Other abnormalities of gait and mobility (R26.89);Pain;Difficulty in walking, not elsewhere classified (R26.2);Muscle weakness (generalized) (M62.81) Pain - Right/Left: Right Pain - part of body:  (abdomen)    Time: 0950-1010 PT Time Calculation (min) (ACUTE ONLY): 20 min   Charges:   PT Evaluation $PT Eval Moderate Complexity: 1 Mod PT Treatments $Gait Training: 8-22 mins        Landynn Dupler, PT, GCS 04/17/23,10:28 AM

## 2023-04-18 LAB — CBC
HCT: 33 % — ABNORMAL LOW (ref 36.0–46.0)
Hemoglobin: 10.8 g/dL — ABNORMAL LOW (ref 12.0–15.0)
MCH: 27.8 pg (ref 26.0–34.0)
MCHC: 32.7 g/dL (ref 30.0–36.0)
MCV: 84.8 fL (ref 80.0–100.0)
Platelets: 206 10*3/uL (ref 150–400)
RBC: 3.89 MIL/uL (ref 3.87–5.11)
RDW: 15.1 % (ref 11.5–15.5)
WBC: 7.3 10*3/uL (ref 4.0–10.5)
nRBC: 0 % (ref 0.0–0.2)

## 2023-04-18 LAB — BASIC METABOLIC PANEL
Anion gap: 6 (ref 5–15)
BUN: 5 mg/dL — ABNORMAL LOW (ref 8–23)
CO2: 25 mmol/L (ref 22–32)
Calcium: 7.8 mg/dL — ABNORMAL LOW (ref 8.9–10.3)
Chloride: 106 mmol/L (ref 98–111)
Creatinine, Ser: 0.44 mg/dL (ref 0.44–1.00)
GFR, Estimated: >60 mL/min (ref >60)
Glucose, Bld: 104 mg/dL — ABNORMAL HIGH (ref 70–99)
Potassium: 3.1 mmol/L — ABNORMAL LOW (ref 3.5–5.1)
Sodium: 137 mmol/L (ref 135–145)

## 2023-04-18 LAB — SURGICAL PATHOLOGY

## 2023-04-18 MED ORDER — ADULT MULTIVITAMIN W/MINERALS CH
1.0000 | ORAL_TABLET | Freq: Every day | ORAL | Status: DC
  Administered 2023-04-18 – 2023-04-20 (×3): 1 via ORAL
  Filled 2023-04-18 (×3): qty 1

## 2023-04-18 MED ORDER — BOOST / RESOURCE BREEZE PO LIQD CUSTOM
1.0000 | Freq: Three times a day (TID) | ORAL | Status: DC
  Administered 2023-04-18 – 2023-04-20 (×7): 1 via ORAL

## 2023-04-18 MED ORDER — POTASSIUM CHLORIDE CRYS ER 20 MEQ PO TBCR
20.0000 meq | EXTENDED_RELEASE_TABLET | Freq: Two times a day (BID) | ORAL | Status: DC
  Administered 2023-04-18 – 2023-04-20 (×5): 20 meq via ORAL
  Filled 2023-04-18 (×5): qty 1

## 2023-04-18 MED ORDER — PANTOPRAZOLE SODIUM 40 MG PO TBEC
40.0000 mg | DELAYED_RELEASE_TABLET | Freq: Every day | ORAL | Status: DC
  Administered 2023-04-18 – 2023-04-19 (×2): 40 mg via ORAL
  Filled 2023-04-18 (×2): qty 1

## 2023-04-18 NOTE — Progress Notes (Signed)
PT Cancellation Note  Patient Details Name: Aleni Bentsen MRN: [id number] DOB: [birth year]   Cancelled Treatment:    Reason Eval/Treat Not Completed: Pain limiting ability to participate. Patient prefers not to ambulate at this time, having a lot of cramping. Will re-attempt later as time allows.    Sylvanna Burggraf 04/18/2023, 11:15 AM

## 2023-04-18 NOTE — Progress Notes (Signed)
Physical Therapy Treatment Patient Details Name: Maria Spencer MRN: [id number] DOB: 19-Dec-1945 Today's Date: 04/18/2023   History of Present Illness 77 y.o. female 1 Day Post-Op s/p laparoscopic sigmoid colectomy for complicated diverticulitis. PMH includesL HLD, OA, HTN, hypothyroid, spinal fusion, R gludeus medius repair,    PT Comments    Patient with much improved strength and balance this session compared to yesterday. She is feeling good, performed bed mobility and transfers mod I. Ambulated 200 feet with SPC and min guard. She is making good progress and will continue to benefit from skilled PT to improve strength for safety with mobility.      Recommendations for follow up therapy are one component of a multi-disciplinary discharge planning process, led by the attending physician.  Recommendations may be updated based on patient status, additional functional criteria and insurance authorization.  Follow Up Recommendations       Assistance Recommended at Discharge PRN  Patient can return home with the following A little help with walking and/or transfers;A little help with bathing/dressing/bathroom;Assist for transportation;Assistance with cooking/housework;Help with stairs or ramp for entrance   Equipment Recommendations  None recommended by PT    Recommendations for Other Services       Precautions / Restrictions Precautions Precautions: Fall Restrictions Weight Bearing Restrictions: No     Mobility  Bed Mobility Overal bed mobility: Modified Independent             General bed mobility comments: Using log roll effectively    Transfers Overall transfer level: Modified independent Equipment used: None Transfers: Sit to/from Stand Sit to Stand: Supervision                Ambulation/Gait Ambulation/Gait assistance: Min guard Gait Distance (Feet): 200 Feet Assistive device: Straight cane Gait Pattern/deviations: Step-through pattern Gait  velocity: WNL     General Gait Details: good ability with spc this session. No lob, slightly fatigued after ambulation   Stairs             Wheelchair Mobility    Modified Rankin (Stroke Patients Only)       Balance Overall balance assessment: Modified Independent Sitting-balance support: Feet supported Sitting balance-Leahy Scale: Normal     Standing balance support: Single extremity supported, During functional activity, Reliant on assistive device for balance Standing balance-Leahy Scale: Good                              Cognition Arousal/Alertness: Awake/alert Behavior During Therapy: WFL for tasks assessed/performed Overall Cognitive Status: Within Functional Limits for tasks assessed                                 General Comments: Very pleasant and motivated        Exercises      General Comments        Pertinent Vitals/Pain Pain Assessment Pain Assessment: Faces Faces Pain Scale: Hurts a little bit Pain Location: abdomen Pain Descriptors / Indicators: Discomfort Pain Intervention(s): Monitored during session    Home Living                          Prior Function            PT Goals (current goals can now be found in the care plan section) Acute Rehab PT Goals Patient Stated Goal: to improve and return  home PT Goal Formulation: With patient Time For Goal Achievement: 04/25/23 Potential to Achieve Goals: Good Progress towards PT goals: Progressing toward goals    Frequency    Min 4X/week      PT Plan Current plan remains appropriate    Co-evaluation              AM-PAC PT "6 Clicks" Mobility   Outcome Measure  Help needed turning from your back to your side while in a flat bed without using bedrails?: None Help needed moving from lying on your back to sitting on the side of a flat bed without using bedrails?: None Help needed moving to and from a bed to a chair (including a  wheelchair)?: None Help needed standing up from a chair using your arms (e.g., wheelchair or bedside chair)?: None Help needed to walk in hospital room?: A Little Help needed climbing 3-5 steps with a railing? : A Lot 6 Click Score: 21    End of Session Equipment Utilized During Treatment: Gait belt Activity Tolerance: Patient tolerated treatment well Patient left: in bed;with call bell/phone within reach Nurse Communication: Mobility status PT Visit Diagnosis: Pain;Difficulty in walking, not elsewhere classified (R26.2);Muscle weakness (generalized) (M62.81) Pain - Right/Left: Right     Time: 1610-9604 PT Time Calculation (min) (ACUTE ONLY): 12 min  Charges:  $Gait Training: 8-22 mins                     Lucynda Rosano, PT, GCS 04/18/23,1:46 PM

## 2023-04-18 NOTE — Care Management Important Message (Signed)
Important Message  Patient Details  Name: Maria Spencer MRN: [id number] Date of Birth: [birth year]   Medicare Important Message Given:  Yes  Initial Medicare IM signed by patient.  Copy left with patient, original to be scanned into chart.    Johnell Comings 04/18/2023, 10:18 AM

## 2023-04-18 NOTE — Discharge Instructions (Signed)
In addition to included general post-operative instructions,  Diet: Resume home diet.   Activity: No heavy lifting >20 pounds (children, pets, laundry, garbage) or strenuous activity for 6 weeks, but light activity and walking are encouraged. Do not drive or drink alcohol if taking narcotic pain medications or having pain that might distract from driving. Okay to work with therapies at home.   Wound care: You may shower/get incision wet with soapy water and pat dry (do not rub incisions), but no baths or submerging incision underwater until follow-up.   Medications: Resume all home medications. For mild to moderate pain: acetaminophen (Tylenol) or ibuprofen/naproxen (if no kidney disease). Combining Tylenol with alcohol can substantially increase your risk of causing liver disease. Narcotic pain medications, if prescribed, can be used for severe pain, though may cause nausea, constipation, and drowsiness. Do not combine Tylenol and Percocet (or similar) within a 6 hour period as Percocet (and similar) contain(s) Tylenol. If you do not need the narcotic pain medication, you do not need to fill the prescription.  Call office [phone] / [phone]) at any time if any questions, worsening pain, fevers/chills, bleeding, drainage from incision site, or other concerns. Minimally Invasive Partial Colectomy, Adult, Care After The following information offers guidance on how to care for yourself after your procedure. Your health care provider may also give you more specific instructions. If you have problems or questions, contact your health care provider. What can I expect after the surgery? After the procedure, it is common to have: Pain, bruising, and swelling. Bloating. Weakness and tiredness (fatigue). Changes to your bowel movements, especially having bowel movements more often. Follow these instructions at home: Medicines Take over-the-counter and prescription medicines only as told by your  health care provider. If you were prescribed an antibiotic medicine, take it as told by your health care provider. Do not stop using the antibiotic even if you start to feel better. Ask your health care provider if the medicine prescribed to you: Requires you to avoid driving or using machinery. Can cause constipation. You may need to take these actions to prevent or treat constipation: Drink enough fluids to keep your urine pale yellow. Take over-the-counter or prescription medicines. Limit foods that are high in fat and processed sugars, such as fried or sweet foods. Eating and drinking Follow instructions from your health care provider about what you may eat and drink. Do not drink alcohol if your health care provider tells you not to drink. Eat a low-fiber diet for the first 4 weeks after surgery or as told by your health care provider.. Most people on a low-fiber eating plan should eat less than 10 grams (g) of fiber a day. Follow recommendations from your health care provider or dietitian about how much fiber you should have each day. Always check food labels to know the fiber content of packaged foods. In general, a low-fiber food will have fewer than 2 g of fiber per serving. In general, try to avoid whole grains, raw fruits and vegetables, dried fruit, tough cuts of meat, nuts, and seeds. Incision care  Follow instructions from your health care provider about how to take care of your incisions. Make sure you: Wash your hands with soap and water for at least 20 seconds before and after you change your bandage (dressing). If soap and water are not available, use hand sanitizer. Change your dressing as told by your health care provider. Leave stitches (sutures), skin glue, or adhesive strips in place. These skin closures may  need to stay in place for 2 weeks or longer. If adhesive strip edges start to loosen and curl up, you may trim the loose edges. Do not remove adhesive strips completely  unless your health care provider tells you to do that. Check your incision area every day for signs of infection. Check for: More redness, swelling, or pain. Fluid or blood. Warmth. Pus or a bad smell. Activity Rest as told by your health care provider. Avoid sitting for a long time without moving. Get up to take short walks every 1-2 hours. This is important to improve blood flow and breathing. Ask for help if you feel weak or unsteady. You may have to avoid lifting. Ask your health care provider how much you can safely lift. Return to your normal activities as told by your health care provider. Ask your health care provider what activities are safe for you. General instructions Do not use any products that contain nicotine or tobacco. These products include cigarettes, chewing tobacco, and vaping devices, such as e-cigarettes. If you need help quitting, ask your health care provider. Do not take baths, swim, or use a hot tub until your health care provider approves. Ask your health care provider if you may take showers. You may only be allowed to take sponge baths. Wear compression stockings as told by your health care provider. These stockings help to prevent blood clots and reduce swelling in your legs. Keep all follow-up visits. This is important to monitor healing and check for any complications. Contact a health care provider if: Medicine is not controlling your pain. You have chills or fever. You have any signs of infection in your incision areas. You have a persistent cough. You have nausea or vomiting. You develop a rash. You have not had a bowel movement in 3 days. Get help right away if: You have severe pain. Your incisions break open after sutures or staples have been removed. You are bleeding from your rectum or have blood in your stool. You have a warm, tender swelling in your leg. You have chest pain or trouble breathing. You have increased swelling in the abdomen. You  feel light-headed or you faint. These symptoms may be an emergency. Get help right away. Call 911. Do not wait to see if the symptoms will go away. Do not drive yourself to the hospital. Summary After surgery, it is common to have some pain, bruising, swelling, bloating, tiredness, weakness, or changes to your bowel movements. Follow instructions from your health care provider about what to eat and drink. Return to your normal activities as told by your health care provider. Check your incision area every day for signs of infection. Get help right away if you have chest pain or trouble breathing. This information is not intended to replace advice given to you by your health care provider. Make sure you discuss any questions you have with your health care provider. Document Revised: 02/27/2022 Document Reviewed: 02/27/2022 Elsevier Patient Education  2024 [employer].

## 2023-04-18 NOTE — Progress Notes (Signed)
Occupational Therapy Treatment Patient Details Name: Maria Spencer MRN: [id number] DOB: [birth year] Today's Date: 04/18/2023   History of present illness 77 y.o. female 1 Day Post-Op s/p laparoscopic sigmoid colectomy for complicated diverticulitis. PMH includesL HLD, OA, HTN, hypothyroid, spinal fusion, R gludeus medius repair,   OT comments  Pt received semi-reclined in bed. Appearing fatigued from  not sleeping well last night, but willing to work with OT on toileting and functional mobility. T/f MOD (I). See flowsheet below for further details of session. Left semi-reclined in bed, dietician in room, bed alarm off due to frequent diarrhea (RN aware), with all needs in reach.  Patient will benefit from continued OT while in acute care.    Recommendations for follow up therapy are one component of a multi-disciplinary discharge planning process, led by the attending physician.  Recommendations may be updated based on patient status, additional functional criteria and insurance authorization.    Assistance Recommended at Discharge Set up Supervision/Assistance  Patient can return home with the following  A little help with walking and/or transfers;A little help with bathing/dressing/bathroom;Assistance with cooking/housework;Direct supervision/assist for medications management;Direct supervision/assist for financial management;Assist for transportation   Equipment Recommendations  Other (comment) (long-handled sponge for shower)    Recommendations for Other Services      Precautions / Restrictions Precautions Precautions: Fall Restrictions Weight Bearing Restrictions: No       Mobility Bed Mobility Overal bed mobility: Modified Independent             General bed mobility comments: Using log roll effectively    Transfers Overall transfer level: Modified independent Equipment used: Rolling walker (2 wheels) Transfers: Sit to/from Stand Sit to Stand: Modified  independent (Device/Increase time)                 Balance Overall balance assessment: Mild deficits observed, not formally tested                                         ADL either performed or assessed with clinical judgement   ADL Overall ADL's : Needs assistance/impaired     Grooming: Wash/dry hands;Supervision/safety;Standing               Lower Body Dressing: Set up;Sit to/from stand Lower Body Dressing Details (indicate cue type and reason): donned mesh underwear and pad mostly from sitting on toilet; stood to pull up; set up only; OT reiterated teaching about AE in case of increase pain with leaning forward. Toilet Transfer: Modified Marine scientist Details (indicate cue type and reason): pt able to walk into bathroom and t/f to toilet without OT assistance while OT was gathering items for session Toileting- Clothing Manipulation and Hygiene: Set up;Sit to/from stand Toileting - Clothing Manipulation Details (indicate cue type and reason): pt able to use wet wipes to perform hygiene after toileting with OT handing her the wipe. Pt also able to place barrier ointment to help with discomfort, as pt stating she has been having loose stool all night     Functional mobility during ADLs: Supervision/safety;Rolling walker (2 wheels) (although pt walked into bathroom without her RW)      Extremity/Trunk Assessment Upper Extremity Assessment Upper Extremity Assessment: Overall WFL for tasks assessed   Lower Extremity Assessment Lower Extremity Assessment: Defer to PT evaluation        Vision       Perception  Praxis      Cognition Arousal/Alertness: Awake/alert Behavior During Therapy: WFL for tasks assessed/performed Overall Cognitive Status: Within Functional Limits for tasks assessed                                 General Comments: Very pleasant and motivated        Exercises       Shoulder Instructions       General Comments Pt walked a lap around the unit at moderate speed with RW, no loss of balance noted. On room air. Pt stating that she didn't sleep well last night 2/2 frequent loose stool, but still agreeable to session and wanting to mobilize.    Pertinent Vitals/ Pain       Pain Assessment Pain Assessment: 0-10 Pain Score:  (unrated, high) Pain Location: abdomen Pain Descriptors / Indicators: Discomfort, Sore  Home Living                                          Prior Functioning/Environment              Frequency  Min 2X/week        Progress Toward Goals  OT Goals(current goals can now be found in the care plan section)  Progress towards OT goals: Progressing toward goals  Acute Rehab OT Goals Patient Stated Goal: Get better; go home OT Goal Formulation: With patient Time For Goal Achievement: 05/01/23 Potential to Achieve Goals: Good ADL Goals Pt Will Perform Grooming: with modified independence;standing Pt Will Perform Lower Body Dressing: with modified independence;with adaptive equipment;sit to/from stand Pt Will Transfer to Toilet: with modified independence;regular height toilet;ambulating Pt Will Perform Toileting - Clothing Manipulation and hygiene: with modified independence;sit to/from stand  Plan Discharge plan remains appropriate    Co-evaluation                 AM-PAC OT "6 Clicks" Daily Activity     Outcome Measure   Help from another person eating meals?: None Help from another person taking care of personal grooming?: None Help from another person toileting, which includes using toliet, bedpan, or urinal?: A Little Help from another person bathing (including washing, rinsing, drying)?: A Little Help from another person to put on and taking off regular upper body clothing?: None Help from another person to put on and taking off regular lower body clothing?: None 6 Click Score: 22     End of Session Equipment Utilized During Treatment: Rolling walker (2 wheels)  OT Visit Diagnosis: Muscle weakness (generalized) (M62.81)   Activity Tolerance Patient tolerated treatment well   Patient Left in bed;with call bell/phone within reach   Nurse Communication Mobility status;Other (comment) (bed alarm off 2/2 pt needing to use bathroom frequently)        Time: 1308-6578 OT Time Calculation (min): 20 min  Charges: OT General Charges $OT Visit: 1 Visit OT Treatments $Self Care/Home Management : 8-22 mins  Linward Foster, MS, OTR/L   Alvester Morin 04/18/2023, 9:32 AM

## 2023-04-18 NOTE — Progress Notes (Signed)
Initial Nutrition Assessment  DOCUMENTATION CODES:   Not applicable  INTERVENTION:   -Boost Breeze po TID, each supplement provides 250 kcal and 9 grams of protein  -MVI with minerals daily -Obtain new wt   NUTRITION DIAGNOSIS:   Inadequate oral intake related to altered GI function as evidenced by per patient/family report.  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  Pt admitted with diverticulitis.   5/22- s/p robotic sigmoid colectomy with low pelvic anastomosis, mobilization of splenic flexure; s/p cystoscopy   Reviewed I/O's: +200 ml x 24 hours and +914 ml since admission  Spoke with pt at bedside, who had just finished walking with PT at time of visit. Pt is pleasant and in good spirits. She reports very poor appetite for the past 4-6 weeks. She explains that she has have multiple diverticulitis flare-ups during this time period and was placed on a course of antibiotics. She has been experiencing significant stomach pain, which prevents her from eating very much. Per pt, she has been consuming mostly a liquid diet. She consumed some coffee for breakfast. She is eager to try solid foods, but notes that she is lactose intolerant; allergies updated in meal ordering system.   Pt reports her UBW is around 135#. She estimates she has lost about 11 pounds over the past month secondary to poor oral intake. No wt hx noted, however, unsure if recent wt is a current wt or stated wt. RD will order a new wt to better assess wt changes.   Pt shares that she tripped on her porch earlier this year and has been awaiting hip surgery, which has been delayed secondary to acute illness. She typically get around with a walker and  cane at home.   Discussed importance of good meal and supplement intake to promote healing. Pt has been thinking or trying nutritional supplements, but prefers to try one that are not milk based due to lactose intolerance. She is amenable to [employer].   Medications reviewed and include potassium chloride.   Labs reviewed: CBGS: 100, K: 3.1.    NUTRITION - FOCUSED PHYSICAL EXAM:  Flowsheet Row Most Recent Value  Orbital Region No depletion  Upper Arm Region Mild depletion  Thoracic and Lumbar Region No depletion  Buccal Region No depletion  Temple Region No depletion  Clavicle Bone Region No depletion  Clavicle and Acromion Bone Region No depletion  Scapular Bone Region No depletion  Dorsal Hand No depletion  Patellar Region Mild depletion  Anterior Thigh Region Mild depletion  Posterior Calf Region Mild depletion  Edema (RD Assessment) None  Hair Reviewed  Eyes Reviewed  Mouth Reviewed  Skin Reviewed  Nails Reviewed       Diet Order:   Diet Order             DIET SOFT Fluid consistency: Thin  Diet effective now                   EDUCATION NEEDS:   Education needs have been addressed  Skin:  Skin Assessment: Skin Integrity Issues: Skin Integrity Issues:: Incisions Incisions: closed vagina and closed abdomen  Last BM:  04/17/23  Height:   Ht Readings from Last 1 Encounters:  04/16/23  4\' 11"  (1.499 m)    Weight:   Wt Readings from Last 1 Encounters:  04/16/23 61.2 kg    Ideal Body Weight:  44.7 kg  BMI:  Body mass index is 27.27 kg/m.  Estimated Nutritional Needs:   Kcal:  1650-1850  Protein:  75-90 grams  Fluid:  > 1.6 L    Levada Schilling, RD, LDN, CDCES Registered Dietitian II Certified Diabetes Care and Education Specialist Please refer to [facility] for RD and/or RD on-call/weekend/after hours pager

## 2023-04-18 NOTE — Progress Notes (Signed)
[facility] SURGICAL PROGRESS NOTE  Hospital Day(s): 2.   Post op day(s): 2 Days Post-Op.   Interval History:  Patient seen and examined No acute events or new complaints overnight.  Patient reports she is having a rough morning Had diarrhea all night - sore from getting up all night  No fever, chills, nausea, emesis  WBC normalized; 7.3K Hgb to 10.8; stable Renal function normal; sCr - 0.44; UO - 925 ccs Mild hypokalemia to 3.1 On FLD; tolerating  She is having bowel function  Worked with therapies   Vital signs in last 24 hours: [min-max] current  Temp:  [98.5 F (36.9 C)-99 F (37.2 C)] 98.5 F (36.9 C) (05/24 0454) Pulse Rate:  [75-80] 79 (05/24 0454) Resp:  [14-17] 17 (05/24 0454) BP: (116-138)/(53-63) 138/61 (05/24 0454) SpO2:  [97 %-100 %] 97 % (05/24 0454)     Height: 4\' 11"  (149.9 cm) Weight: 61.2 kg BMI (Calculated): 27.25   Intake/Output last 2 shifts:  05/23 0701 - 05/24 0700 In: 200  Out: -    Physical Exam:  Constitutional: alert, cooperative and no distress  Respiratory: breathing non-labored at rest  Cardiovascular: regular rate and sinus rhythm  Gastrointestinal: Soft, incisional soreness, non-distended, no rebound/guarding Integumentary: Laparoscopic incisions are CDI with dermabond, no erythema or drainage   Labs:     Latest Ref Rng & Units 04/18/2023    4:13 AM 04/17/2023    3:46 AM 03/06/2023   10:52 AM  CBC  WBC 4.0 - 10.5 K/uL 7.3  11.8  5.5   Hemoglobin 12.0 - 15.0 g/dL 16.1  09.6  04.5   Hematocrit 36.0 - 46.0 % 33.0  33.7  41.5   Platelets 150 - 400 K/uL 206  190  325       Latest Ref Rng & Units 04/18/2023    4:13 AM 04/17/2023    3:46 AM 03/06/2023   10:52 AM  CMP  Glucose 70 - 99 mg/dL 409  811  914   BUN 8 - 23 mg/dL 5  9  11    Creatinine 0.44 - 1.00 mg/dL 7.82  9.56  2.13   Sodium 135 - 145 mmol/L 137  130  138   Potassium 3.5 - 5.1 mmol/L 3.1  3.7  3.6   Chloride 98 - 111 mmol/L 106  102  102   CO2 22 - 32  mmol/L 25  21  27    Calcium 8.9 - 10.3 mg/dL 7.8  7.3  8.9      Imaging studies: No new pertinent imaging studies   Assessment/Plan: 77 y.o. female 2 Days Post-Op s/p laparoscopic sigmoid colectomy for complicated diverticulitis   - Advance diet; soft  - Discontinue IVF support  - Monitor abdominal examination; on-going bowel function  - Pain control prn; antiemetics prn  - Monitor leukocytosis; normalized  - Mobilize; therapy on board - HH orders done 0523   - Discharge Planning: I do think she will benefit from 1 more day in house given diarrhea and pain, hesitant to start anti-diarrheal just yet. Anticipate home in AM tomorrow (05/25).   All of the above findings and recommendations were discussed with the patient, and the medical team, and all of patient's questions were answered to his expressed satisfaction.  -- Lynden Oxford, PA-C [facility] 04/18/2023, 7:30 AM M-F: 7am - 4pm

## 2023-04-19 LAB — BASIC METABOLIC PANEL
Anion gap: 6 (ref 5–15)
BUN: 5 mg/dL — ABNORMAL LOW (ref 8–23)
CO2: 27 mmol/L (ref 22–32)
Calcium: 8.4 mg/dL — ABNORMAL LOW (ref 8.9–10.3)
Chloride: 106 mmol/L (ref 98–111)
Creatinine, Ser: 0.42 mg/dL — ABNORMAL LOW (ref 0.44–1.00)
GFR, Estimated: >60 mL/min (ref >60)
Glucose, Bld: 106 mg/dL — ABNORMAL HIGH (ref 70–99)
Potassium: 3.6 mmol/L (ref 3.5–5.1)
Sodium: 139 mmol/L (ref 135–145)

## 2023-04-19 MED ORDER — ORAL CARE MOUTH RINSE: 15.0000 mL | OROMUCOSAL | Status: DC | PRN

## 2023-04-19 MED ORDER — ACETAMINOPHEN 500 MG PO TABS
1000.0000 mg | ORAL_TABLET | Freq: Four times a day (QID) | ORAL | Status: DC
  Administered 2023-04-19 – 2023-04-20 (×3): 1000 mg via ORAL
  Filled 2023-04-19 (×3): qty 2

## 2023-04-19 MED ORDER — KETOROLAC TROMETHAMINE 15 MG/ML IJ SOLN
15.0000 mg | Freq: Four times a day (QID) | INTRAMUSCULAR | Status: DC
  Filled 2023-04-19: qty 1

## 2023-04-19 MED ORDER — OXYCODONE HCL 5 MG PO TABS
5.0000 mg | ORAL_TABLET | ORAL | Status: DC | PRN
  Administered 2023-04-19 – 2023-04-20 (×3): 5 mg via ORAL
  Filled 2023-04-19 (×3): qty 1

## 2023-04-19 NOTE — Progress Notes (Signed)
PT Cancellation Note  Patient Details Name: Jessey Hi MRN: [id number] DOB: 08-25-1946   Cancelled Treatment:     PT attempt. Pt in BR (independently) upon arrival. Has been up with mobility team already and seems to be progressing well. Will return later this date if pt still admitted.    Rushie Chestnut 04/19/2023, 9:19 AM

## 2023-04-19 NOTE — Plan of Care (Signed)

## 2023-04-19 NOTE — Progress Notes (Signed)
Mobility Specialist - Progress Note   04/19/23 0850  Mobility  Activity Ambulated with assistance in hallway  Level of Assistance Independent  Assistive Device Cane  Distance Ambulated (ft) 300 ft  Activity Response Tolerated well  Mobility Referral Yes  $Mobility charge 1 Mobility  Mobility Specialist Start Time (ACUTE ONLY) [id number]  Mobility Specialist Stop Time (ACUTE ONLY) [id number]  Mobility Specialist Time Calculation (min) (ACUTE ONLY) 16 min   Pt OOB on RA upon arrival. Pt ambulates in hallway indep. PT is left in recliner with needs in reach.   Terrilyn Saver  Mobility Specialist  04/19/23 8:51 AM

## 2023-04-19 NOTE — Progress Notes (Signed)
CC: s/p rob sigmoidectomy Subjective: Still some soreness, taking some PO , no N/V Pain is the main issues Labs ok + flatus  Objective: Vital signs in last 24 hours: Temp:  [98.1 F (36.7 C)-98.8 F (37.1 C)] 98.4 F (36.9 C) (05/25 0830) Pulse Rate:  [80-88] 84 (05/25 0830) Resp:  [16-18] 18 (05/25 0830) BP: (142-156)/(57-76) 145/57 (05/25 0830) SpO2:  [96 %-100 %] 96 % (05/25 0830) Last BM Date : 04/17/23  Intake/Output from previous day: 05/24 0701 - 05/25 0700 In: 240 [P.O.:240] Out: 0  Intake/Output this shift: Total I/O In: 240 [P.O.:240] Out: -   Physical exam:  Constitutional: alert, cooperative and no distress  Respiratory: breathing non-labored at rest  Cardiovascular: regular rate and sinus rhythm  Gastrointestinal: Soft, incisional soreness, non-distended, no rebound/guarding Integumentary: Laparoscopic incisions are CDI with dermabond, no erythema or drainage      Lab Results: CBC  Recent Labs    04/17/23 0346 04/18/23 0413  WBC 11.8* 7.3  HGB 10.9* 10.8*  HCT 33.7* 33.0*  PLT 190 206   BMET Recent Labs    04/18/23 0413 04/19/23 0532  NA 137 139  K 3.1* 3.6  CL 106 106  CO2 25 27  GLUCOSE 104* 106*  BUN 5* 5*  CREATININE 0.44 0.42*  CALCIUM 7.8* 8.4*   PT/INR No results for input(s): "LABPROT", "INR" in the last 72 hours. ABG No results for input(s): "PHART", "HCO3" in the last 72 hours.  Invalid input(s): "PCO2", "PO2"  Studies/Results: No results found.  Anti-infectives: Anti-infectives (From admission, onward)    Start     Dose/Rate Route Frequency Ordered Stop   04/16/23 2000  cefoTEtan (CEFOTAN) 2 g in sodium chloride 0.9 % 100 mL IVPB        2 g 200 mL/hr over 30 Minutes Intravenous Every 12 hours 04/16/23 1540 04/17/23 0838   04/16/23 0600  cefoTEtan (CEFOTAN) 2 g in sodium chloride 0.9 % 100 mL IVPB        2 g 200 mL/hr over 30 Minutes Intravenous On call to O.R. 04/15/23 2227 04/17/23 1610        Assessment/Plan:  Doing ok DC in 24-48 hrs No evidence of complications  Sterling Big, MD, FACS  04/19/2023

## 2023-04-20 LAB — CBC
HCT: 34.2 % — ABNORMAL LOW (ref 36.0–46.0)
Hemoglobin: 11.1 g/dL — ABNORMAL LOW (ref 12.0–15.0)
MCH: 27.4 pg (ref 26.0–34.0)
MCHC: 32.5 g/dL (ref 30.0–36.0)
MCV: 84.4 fL (ref 80.0–100.0)
Platelets: 246 10*3/uL (ref 150–400)
RBC: 4.05 MIL/uL (ref 3.87–5.11)
RDW: 14.6 % (ref 11.5–15.5)
WBC: 5.9 10*3/uL (ref 4.0–10.5)
nRBC: 0 % (ref 0.0–0.2)

## 2023-04-20 LAB — BASIC METABOLIC PANEL
Anion gap: 5 (ref 5–15)
BUN: 6 mg/dL — ABNORMAL LOW (ref 8–23)
CO2: 27 mmol/L (ref 22–32)
Calcium: 8.6 mg/dL — ABNORMAL LOW (ref 8.9–10.3)
Chloride: 106 mmol/L (ref 98–111)
Creatinine, Ser: 0.44 mg/dL (ref 0.44–1.00)
GFR, Estimated: >60 mL/min (ref >60)
Glucose, Bld: 113 mg/dL — ABNORMAL HIGH (ref 70–99)
Potassium: 3.8 mmol/L (ref 3.5–5.1)
Sodium: 138 mmol/L (ref 135–145)

## 2023-04-20 MED ORDER — HYDROCODONE-ACETAMINOPHEN 5-325 MG PO TABS
1.0000 | ORAL_TABLET | Freq: Four times a day (QID) | ORAL | 0 refills | Status: DC | PRN
Start: 2023-04-20 — End: 2023-04-29

## 2023-04-20 NOTE — TOC Transition Note (Signed)
Transition of Care [facility]) - CM/SW Discharge Note   Patient Details  Name: Maria Spencer MRN: [id number] Date of Birth: 04-10-1946  Transition of Care [facility]) CM/SW Contact:  Liliana Cline, LCSW Phone Number: 04/20/2023, 11:14 AM   Clinical Narrative:    Patient has orders to DC home today.  CSW left confidential voicemail for Alyssa with [facility] informing her of DC.   Final next level of care: Home w Home Health Services Barriers to Discharge: Barriers Resolved   Patient Goals and CMS Choice CMS Medicare.gov Compare Post Acute Care list provided to:: Patient    Discharge Placement                         Discharge Plan and Services Additional resources added to the After Visit Summary for       Post Acute Care Choice: Home Health                    HH Arranged: PT, OT [facility] Agency: [facility] Date [facility] Agency Contacted: 04/20/23   Representative spoke with at [facility] Agency: Alyssa  Social Determinants of Health (SDOH) Interventions SDOH Screenings   Food Insecurity: No Food Insecurity (04/16/2023)  Housing: Low Risk  (04/16/2023)  Recent Concern: Housing - Medium Risk (03/05/2023)  Transportation Needs: No Transportation Needs (04/16/2023)  Utilities: Not At Risk (04/16/2023)  Alcohol Screen: Low Risk  (03/05/2023)  Depression (PHQ2-9): Low Risk  (02/05/2023)  Recent Concern: Depression (PHQ2-9) - Medium Risk (12/17/2022)  Financial Resource Strain: Low Risk  (03/05/2023)  Physical Activity: Unknown (03/05/2023)  Social Connections: Moderately Integrated (03/05/2023)  Stress: Stress Concern Present (03/05/2023)  Tobacco Use: Low Risk  (04/16/2023)     Readmission Risk Interventions     No data to display

## 2023-04-20 NOTE — Progress Notes (Signed)
Mobility Specialist - Progress Note   04/20/23 0950  Mobility  Activity Ambulated independently in hallway  Level of Assistance Independent  Assistive Device [place]  Distance Ambulated (ft) 500 ft  Activity Response Tolerated well  Mobility Referral Yes  $Mobility charge 1 Mobility  Mobility Specialist Start Time (ACUTE ONLY) 0931  Mobility Specialist Stop Time (ACUTE ONLY) 0944  Mobility Specialist Time Calculation (min) (ACUTE ONLY) 13 min   Pt sitting in recliner on RA upon arrival. Pt STS and ambulates in hallway indep. Pt returns to recliner with needs in reach.   Terrilyn Saver  Mobility Specialist  04/20/23 9:51 AM

## 2023-04-20 NOTE — Plan of Care (Signed)
Discharge instructions reviewed with patient and patient will be discharged to home

## 2023-04-20 NOTE — Discharge Summary (Signed)
Patient ID: Maria Spencer MRN: [id number] DOB/AGE: [birth year] 77 y.o.  Admit date: 04/16/2023 Discharge date: 04/20/2023   Discharge Diagnoses:  Principal Problem:   Sigmoid diverticulitis   Procedures:robotic sigmoid colectomy  Hospital Course:  77 yo female underwent an uneventful  robotic laparoscopic sigmoidectomy.  Patient was kept 4 days and did well. At  The time of discharge the patient was ambulating,  pain was controlled.  Her vital signs were stable and she was afebrile.   physical exam at discharge showed a pt  in no acute distress.  Awake and alert.  Abdomen: Soft incisions healing well without infection or peritonitis.  Extremities well-perfused and no edema.  Condition of the patient the time of discharge was stable   Disposition: Discharge disposition: 01-Home or Self Care       Discharge Instructions     Call MD for:  difficulty breathing, headache or visual disturbances   Complete by: As directed    Call MD for:  extreme fatigue   Complete by: As directed    Call MD for:  hives   Complete by: As directed    Call MD for:  persistant dizziness or light-headedness   Complete by: As directed    Call MD for:  persistant nausea and vomiting   Complete by: As directed    Call MD for:  redness, tenderness, or signs of infection (pain, swelling, redness, odor or green/yellow discharge around incision site)   Complete by: As directed    Call MD for:  severe uncontrolled pain   Complete by: As directed    Call MD for:  temperature >100.4   Complete by: As directed    Diet - low sodium heart healthy   Complete by: As directed    Increase activity slowly   Complete by: As directed    Lifting restrictions   Complete by: As directed    20 lbs x 5 wks      Allergies as of 04/20/2023       Reactions   Sulfa Antibiotics Anaphylaxis, Hives, Itching, Shortness Of Breath, Swelling   Lips & throat swelled   Nitrofuran Derivatives Swelling   Lip swelling; "eyes  get red and puffy"   Amlodipine Besylate Other (See Comments)   gas, bloating  Heart racing    Other Diarrhea   Oral Contrast gives her severe diarrhea        Medication List     STOP taking these medications    bisacodyl 5 MG EC tablet Commonly known as: DULCOLAX   metroNIDAZOLE 500 MG tablet Commonly known as: Flagyl   neomycin 500 MG tablet Commonly known as: MYCIFRADIN   polyethylene glycol powder 17 GM/SCOOP powder Commonly known as: MiraLax       TAKE these medications    atorvastatin 20 MG tablet Commonly known as: LIPITOR Take 1 tablet (20 mg total) by mouth daily. What changed:  when to take this additional instructions   b complex vitamins capsule Take 1 capsule by mouth daily.   cholecalciferol 25 MCG (1000 UNIT) tablet Commonly known as: VITAMIN D3 Take 1,000 Units by mouth daily.   [employer] CALCIUM 600 & VITAMIN D3 PO Take 1 tablet by mouth daily.   D-MANNOSE PO Take 1,300 mg by mouth daily.   HYDROcodone-acetaminophen 5-325 MG tablet Commonly known as: NORCO/VICODIN Take 1-2 tablets by mouth every 6 (six) hours as needed for moderate pain.   ibuprofen 200 MG tablet Commonly known as: ADVIL Take 200 mg by mouth  daily. 2 tablet qhs   levothyroxine 88 MCG tablet Commonly known as: SYNTHROID Take 1 tablet (88 mcg total) by mouth daily before breakfast. 30 minutes   Melatonin 10 MG Tabs Take 20 mg by mouth daily.   methocarbamol 500 MG tablet Commonly known as: ROBAXIN Take 1 tablet (500 mg total) by mouth every 6 (six) hours as needed for muscle spasms.   metoprolol succinate 50 MG 24 hr tablet Commonly known as: TOPROL-XL TAKE 1 TABLET (50 MG TOTAL) BY MOUTH TWICE A DAY .TAKE WITH OR IMMEDIATELY FOLLOWING A MEAL What changed: See the new instructions.   Multi-Vitamins Tabs Take 1 tablet by mouth daily.   telmisartan 80 MG tablet Commonly known as: MICARDIS Take 1 tablet (80 mg total) by mouth daily. What changed: when to take  this        Follow-up Information     [facility] Follow up.   Why: They will follow up with you for your home health needs.        Campbell Lerner, MD. Schedule an appointment as soon as possible for a visit on 04/29/2023.   Specialty: General Surgery Why: s/p sigmoid colectomy Contact information: [address] [place] [address] [phone]                  Sterling Big, MD FACS

## 2023-04-22 ENCOUNTER — Telehealth: Payer: Self-pay | Admitting: *Deleted

## 2023-04-22 ENCOUNTER — Ambulatory Visit: Payer: Medicare Other | Admitting: Cardiology

## 2023-04-22 NOTE — Transitions of Care (Post Inpatient/ED Visit) (Signed)
04/22/2023  Name: Maria Spencer MRN: [id number] DOB: 1945/12/09  Today's TOC FU Call Status: Today's TOC FU Call Status:: Successful TOC FU Call Competed TOC FU Call Complete Date: 04/22/23  Transition Care Management Follow-up Telephone Call Date of Discharge: 04/22/23 Discharge Facility: [facility]) Type of Discharge: Inpatient Admission Primary Inpatient Discharge Diagnosis:: Sigmoid diverticulitis How have you been since you were released from the hospital?: Better (still having some pain. She is trying to not take as much hydrocodone because it makes her constipated. Patient contacted Dr for medication for constipation. RN discussed apple juice and warm prune juice.) Any questions or concerns?: No  Items Reviewed: Did you receive and understand the discharge instructions provided?: Yes Medications obtained,verified, and reconciled?: Yes (Medications Reviewed) Dietary orders reviewed?: Yes Type of Diet Ordered:: low sodium heart healthy soft foods Do you have support at home?: Yes People in Home: alone Name of Support/Comfort Primary Source: Brett Canales  Medications Reviewed Today: Medications Reviewed Today     Reviewed by Luella Cook, RN (Case Manager) on 04/22/23 at 1235  Med List Status: <None>   Medication Order Taking? Sig Documenting Provider Last Dose Status Informant  atorvastatin (LIPITOR) 20 MG tablet [id number] Yes Take 1 tablet (20 mg total) by mouth daily.  Patient taking differently: Take 20 mg by mouth every other day. AM   McLean-Scocuzza, Pasty Spillers, MD Taking Active   b complex vitamins capsule [id number] Yes Take 1 capsule by mouth daily. [provider] Taking Active Self  Calcium Carb-Cholecalciferol ([employer]S CALCIUM 600 & VITAMIN D3 PO) [id number] Yes Take 1 tablet by mouth daily. [provider] Taking Active Self  cholecalciferol (VITAMIN D) 25 MCG (1000 UNIT) tablet [id number] Yes Take 1,000 Units by mouth  daily. [provider] Taking Active Self  D-MANNOSE PO [id number] Yes Take 1,300 mg by mouth daily. [provider] Taking Active Self  HYDROcodone-acetaminophen (NORCO/VICODIN) 5-325 MG tablet [id number] Yes Take 1-2 tablets by mouth every 6 (six) hours as needed for moderate pain. Leafy Ro, MD Taking Active   ibuprofen (ADVIL) 200 MG tablet [id number] Yes Take 200 mg by mouth daily. 2 tablet qhs [provider] Taking Active Self  levothyroxine (SYNTHROID) 88 MCG tablet [id number]  Take 1 tablet (88 mcg total) by mouth daily before breakfast. 30 minutes McLean-Scocuzza, Pasty Spillers, MD  Active   Melatonin 10 MG TABS [id number] Yes Take 20 mg by mouth daily. [provider] Taking Active Self  methocarbamol (ROBAXIN) 500 MG tablet [id number] Yes Take 1 tablet (500 mg total) by mouth every 6 (six) hours as needed for muscle spasms. Susanne Borders, PA Taking Active   metoprolol succinate (TOPROL-XL) 50 MG 24 hr tablet [id number] Yes TAKE 1 TABLET (50 MG TOTAL) BY MOUTH TWICE A DAY .TAKE WITH OR IMMEDIATELY FOLLOWING A MEAL  Patient taking differently: daily.   Charlsie Quest, NP Taking Active Self  Multiple Vitamin (MULTI-VITAMINS) TABS [id number] Yes Take 1 tablet by mouth daily. [provider] Taking Active Self  telmisartan (MICARDIS) 80 MG tablet [id number] Yes Take 1 tablet (80 mg total) by mouth daily.  Patient taking differently: Take 80 mg by mouth every morning.   McLean-Scocuzza, Pasty Spillers, MD Taking Active   Med List Note Concepcion Elk, RN 10/12/21 1111): 10-12-21 PA request for Lidocaine ointment submitted per CMM Key [id number]  dw            Home Care and Equipment/Supplies: Were Home Health Services Ordered?: Yes Name of  Home Health Agency:: [facility] Has Agency set up a time to come to your home?: No EMR reviewed for Home Health Orders: Orders present/patient has not received call (refer to CM for follow-up) (RN gave patient the phone  number to contact them. Patient is not sure she really needs them. She has been able to get around the house. She is wanting to save visits of hip surgery.) Any new equipment or medical supplies ordered?: No  Functional Questionnaire: Do you need assistance with bathing/showering or dressing?: No Do you need assistance with meal preparation?: No Do you need assistance with eating?: No Do you have difficulty maintaining continence: No Do you need assistance with getting out of bed/getting out of a chair/moving?: No Do you have difficulty managing or taking your medications?: No  Follow up appointments reviewed: PCP Follow-up appointment confirmed?: Yes Date of PCP follow-up appointment?: 05/01/23 Follow-up Provider: Dana Allan 3:00 Specialist [facility] Follow-up appointment confirmed?: Yes Date of Specialist follow-up appointment?: 04/29/23 Follow-Up Specialty Provider:: [id number] Dr Claudine Mouton 1:45, [id number] 10:45 Dr Marcell Barlow , [id number] Video visit with Charlsie Quest Do you need transportation to your follow-up appointment?: No Do you understand care options if your condition(s) worsen?: Yes-patient verbalized understanding  SDOH Interventions Today    Flowsheet Row Most Recent Value  SDOH Interventions   Food Insecurity Interventions Intervention Not Indicated  Housing Interventions Intervention Not Indicated  Transportation Interventions Intervention Not Indicated, Patient Resources (Friends/Family)      Interventions Today    Flowsheet Row Most Recent Value  General Interventions   General Interventions Discussed/Reviewed General Interventions Discussed, General Interventions Reviewed, Doctor Visits  Doctor Visits Discussed/Reviewed Doctor Visits Discussed, Doctor Visits Reviewed  Exercise Interventions   Exercise Discussed/Reviewed Exercise Discussed  [PT/OT has been ordered]  Nutrition Interventions   Nutrition Discussed/Reviewed Nutrition Discussed, Nutrition Reviewed   Pharmacy Interventions   Pharmacy Dicussed/Reviewed Pharmacy Topics Discussed, Pharmacy Topics Reviewed      TOC Interventions Today    Flowsheet Row Most Recent Value  TOC Interventions   TOC Interventions Discussed/Reviewed TOC Interventions Discussed, TOC Interventions Reviewed        Gean Maidens BSN RN [facility] [phone]

## 2023-04-24 ENCOUNTER — Encounter: Payer: Medicare Other | Admitting: Neurosurgery

## 2023-04-24 ENCOUNTER — Ambulatory Visit: Payer: Medicare Other | Attending: Cardiology | Admitting: Cardiology

## 2023-04-24 ENCOUNTER — Encounter: Payer: Self-pay | Admitting: Cardiology

## 2023-04-24 ENCOUNTER — Telehealth: Payer: Self-pay

## 2023-04-24 ENCOUNTER — Telehealth: Payer: Medicare Other | Admitting: Physician Assistant

## 2023-04-24 VITALS — BP 110/60 | HR 89 | Ht 59.0 in | Wt 130.0 lb

## 2023-04-24 DIAGNOSIS — Z853 Personal history of malignant neoplasm of breast: Secondary | ICD-10-CM | POA: Diagnosis not present

## 2023-04-24 DIAGNOSIS — E039 Hypothyroidism, unspecified: Secondary | ICD-10-CM | POA: Diagnosis not present

## 2023-04-24 DIAGNOSIS — M810 Age-related osteoporosis without current pathological fracture: Secondary | ICD-10-CM | POA: Diagnosis not present

## 2023-04-24 DIAGNOSIS — R0602 Shortness of breath: Secondary | ICD-10-CM | POA: Diagnosis not present

## 2023-04-24 DIAGNOSIS — I340 Nonrheumatic mitral (valve) insufficiency: Secondary | ICD-10-CM | POA: Diagnosis not present

## 2023-04-24 DIAGNOSIS — R002 Palpitations: Secondary | ICD-10-CM | POA: Diagnosis not present

## 2023-04-24 DIAGNOSIS — Z923 Personal history of irradiation: Secondary | ICD-10-CM | POA: Diagnosis not present

## 2023-04-24 DIAGNOSIS — I10 Essential (primary) hypertension: Secondary | ICD-10-CM | POA: Diagnosis not present

## 2023-04-24 DIAGNOSIS — K219 Gastro-esophageal reflux disease without esophagitis: Secondary | ICD-10-CM | POA: Diagnosis not present

## 2023-04-24 DIAGNOSIS — Z48815 Encounter for surgical aftercare following surgery on the digestive system: Secondary | ICD-10-CM | POA: Diagnosis not present

## 2023-04-24 DIAGNOSIS — E782 Mixed hyperlipidemia: Secondary | ICD-10-CM | POA: Diagnosis not present

## 2023-04-24 DIAGNOSIS — E785 Hyperlipidemia, unspecified: Secondary | ICD-10-CM | POA: Diagnosis not present

## 2023-04-24 DIAGNOSIS — I4719 Other supraventricular tachycardia: Secondary | ICD-10-CM | POA: Diagnosis not present

## 2023-04-24 DIAGNOSIS — Z79891 Long term (current) use of opiate analgesic: Secondary | ICD-10-CM | POA: Diagnosis not present

## 2023-04-24 DIAGNOSIS — M1389 Other specified arthritis, multiple sites: Secondary | ICD-10-CM | POA: Diagnosis not present

## 2023-04-24 MED ORDER — METOPROLOL SUCCINATE ER 50 MG PO TB24
50.0000 mg | ORAL_TABLET | Freq: Every day | ORAL | 2 refills | Status: DC
Start: 2023-04-24 — End: 2023-08-11

## 2023-04-24 NOTE — Telephone Encounter (Signed)
Will forward forms to provider once received.

## 2023-04-24 NOTE — Patient Instructions (Addendum)
Medication Instructions:   Your physician recommends that you continue on your current medications as directed. Please refer to the Current Medication list given to you today.   Please Continue Metoprolol Succinate 50 mg tablet once daily.  *If you need a refill on your cardiac medications before your next appointment, please call your pharmacy*   Lab Work:  No lab work ordered today.  If you have labs (blood work) drawn today and your tests are completely normal, you will receive your results only by: MyChart Message (if you have MyChart) OR A paper copy in the mail If you have any lab test that is abnormal or we need to change your treatment, we will call you to review the results.   Testing/Procedures:  No testing ordered today.   Follow-Up: At [facility], you and your health needs are our priority.  As part of our continuing mission to provide you with exceptional heart care, we have created designated Provider Care Teams.  These Care Teams include your primary Cardiologist (physician) and Advanced Practice Providers (APPs -  Physician Assistants and Nurse Practitioners) who all work together to provide you with the care you need, when you need it.  We recommend signing up for the patient portal called "MyChart".  Sign up information is provided on this After Visit Summary.  MyChart is used to connect with patients for Virtual Visits (Telemedicine).  Patients are able to view lab/test results, encounter notes, upcoming appointments, etc.  Non-urgent messages can be sent to your provider as well.   To learn more about what you can do with MyChart, go to [email or web address].    Your next appointment:   6 month(s)  Provider:   You may see Yvonne Kendall, MD or one of the following Advanced Practice Providers on your designated Care Team:   Nicolasa Ducking, NP Eula Listen, PA-C Cadence Fransico Michael, PA-C Charlsie Quest, NP

## 2023-04-24 NOTE — Progress Notes (Signed)
Virtual Visit via Telephone Note   Because of Maria Spencer's co-morbid illnesses, she is at least at moderate risk for complications without adequate follow up.  This format is felt to be most appropriate for this patient at this time.  The patient did not have access to video technology/had technical difficulties with video requiring transitioning to audio format only (telephone).  All issues noted in this document were discussed and addressed.  No physical exam could be performed with this format.  Please refer to the patient's chart for her consent to telehealth for [facility].    Date:  04/24/2023   ID:  Maria Spencer, DOB 12/03/1945, MRN [id number] The patient was identified using 2 identifiers.  Patient Location: Home Provider Location: Office/Clinic   PCP:  Dana Allan, MD   [facility] Providers Cardiologist:  Yvonne Kendall, MD     Evaluation Performed:  Follow-Up Visit  Chief Complaint: Hypertension  History of Present Illness:    Maria Spencer is a 77 y.o. female with a past medical history of hypertension, hyperlipidemia, right breast cancer status postlumpectomy with radiation, hypothyroidism, esophagitis, just recently underwent robotic sigmoid colectomy on 04/16/2023 and was admitted in the hospital and discharged 04/20/2023.  Prior coronary CTA in 2020 showed normal coronary arteries.  Echocardiogram in 07/2019 revealed LVEF 55 to 60%, no regional wall abnormalities, mild LVH, G1 DD, mild MR.  She was last seen in clinic 01/21/2023 stating that she was doing okay.  Blood pressure was slightly elevated she occasionally had palpitations but she needed surgical restratification prior to having surgery.  She was found to have some shortness of breath and DOE with fatigue with a repeat echocardiogram that was ordered.  Toprol-XL was also increased to 50 mg twice daily.  She was cleared for surgery and had her follow-up call today.  Since being on  pain medications she has not decreased her Toprol dosing to daily from twice daily and her blood pressure has remained stable.  She denies any worsening shortness of breath denies any chest pain denies any swelling, or fatigue.  She is recovering at home with home health coming out from surgery at this point.  Her echocardiogram was completed previously and was stable.  Past Medical History:  Diagnosis Date   Arthritis    neck, back, left knee;    Breast cancer (HCC) 2009   right lumpectomy s/p radiation x 1 week 2x per day only    Chicken pox    Chronic UTI    established with urology    COVID-19    ? 12/2020 sob  took antiviral, 08/15/21   Diverticular disease    -osis and -itis    Diverticulitis    Esophagitis    egd 05/17/14 see report scanned into chart   GERD (gastroesophageal reflux disease)    h/o   History of kidney stones    Hormone disorder    Hyperlipidemia    Hypertension    controlled well with medication;    Hypothyroidism    LVH (left ventricular hypertrophy)    mild   MR (mitral regurgitation)    Osteoporosis    PAC (premature atrial contraction)    Personal history of radiation therapy 2009   F/U right breast cancer   PSVT (paroxysmal supraventricular tachycardia)    PVC (premature ventricular contraction)    Sepsis (HCC)    Thyroid disease    hypothyroidism    Past Surgical History:  Procedure Laterality Date   BONE  GRAFT HIP ILIAC CREST  2018   + cage left hip 10/23/17    BREAST BIOPSY Left 2009   clip,benign   BREAST BIOPSY Right 2009   +   BREAST LUMPECTOMY Right 2009   2009 lumpectomy    CARDIAC CATHETERIZATION  2001   No stents;  results were WNL Dr. requested it for HTN   CATARACT EXTRACTION, BILATERAL Bilateral 10/2019   COLONOSCOPY WITH PROPOFOL N/A 05/15/2020   Procedure: COLONOSCOPY WITH PROPOFOL;  Surgeon: Toney Reil, MD;  Location: [facility] ENDOSCOPY;  Service: Gastroenterology;  Laterality: N/A;   HARDWARE REMOVAL N/A 03/12/2023    Procedure: REMOVAL OF ILIAC SCREWS;  Surgeon: Venetia Night, MD;  Location: [facility] ORS;  Service: Neurosurgery;  Laterality: N/A;   INSERTION OF MESH N/A 05/29/2018   Procedure: INSERTION OF MESH;  Surgeon: Luretha Murphy, MD;  Location: [facility] ORS;  Service: General;  Laterality: N/A;   QUADRICEPS TENDON REPAIR Right 12/05/2020   Procedure: OPEN REPAIR OF RIGHT GLUTEUS MINIMUS TENDON;  Surgeon: Christena Flake, MD;  Location: [facility] ORS;  Service: Orthopedics;  Laterality: Right;   right gluteus medius/minimus repair Dr. Joice Lofts 11/2020      Dr. Joice Lofts   SHOULDER SURGERY Left 2008, 2009   x2 rotator cuff surgeries left shoulders   SPINAL FUSION  07/24/2020   SPINE SURGERY  10/23/2017   spinal 09/2017 h/o scoliosis [facility] L4-S1 OLIF T10 to ililum fusion   TONSILLECTOMY     age 66 y.o.    TOTAL KNEE ARTHROPLASTY Left 03/18/2022   Procedure: TOTAL KNEE ARTHROPLASTY;  Surgeon: Joen Laura, MD;  Location: [facility] ORS;  Service: Orthopedics;  Laterality: Left;   TOTAL SHOULDER REPLACEMENT Right 2012   TUBAL LIGATION     age 68   VENTRAL HERNIA REPAIR N/A 05/29/2018   Procedure: LAPAROSCOPIC VENTRAL / LATERAL HERNIA REPAIR;  Surgeon: Luretha Murphy, MD;  Location: [facility] ORS;  Service: General;  Laterality: N/A;     No outpatient medications have been marked as taking for the 04/24/23 encounter (Appointment) with Charlsie Quest, NP.     Allergies:   Sulfa antibiotics, Nitrofuran derivatives, Amlodipine besylate, and Other   Social History   Tobacco Use   Smoking status: Never    Passive exposure: Never   Smokeless tobacco: Never  Vaping Use   Vaping Use: Never used  Substance Use Topics   Alcohol use: Not Currently    Comment: rare   Drug use: No     Family Hx: The patient's family history includes AAA (abdominal aortic aneurysm) in her father; Arthritis in her father, mother, sister, and sister; Cancer in her brother and father; Coronary artery disease (age of onset: 62) in her mother;  Diabetes in her father; Drug abuse in her daughter; Heart disease (age of onset: 24) in her mother; Hyperlipidemia in her mother, sister, and sister; Hypertension in her mother and sister; Mental retardation in her brother. There is no history of Bladder Cancer, Kidney cancer, or Breast cancer.  ROS:   Please see the history of present illness.    10 point review of systems is considered negative with exception of what is listed in the HPI All other systems reviewed and are negative.   Prior CV studies:   The following studies were reviewed today: TTE 03/10/2023 1. Left ventricular ejection fraction, by estimation, is 60 to 65%. The  left ventricle has normal function. The left ventricle has no regional  wall motion abnormalities. Left ventricular diastolic parameters are  consistent  with Grade I diastolic  dysfunction (impaired relaxation).   2. Right ventricular systolic function is normal. The right ventricular  size is normal.   3. The mitral valve is normal in structure. Mild mitral valve  regurgitation.   4. The aortic valve is tricuspid. Aortic valve regurgitation is not  visualized.   5. The inferior vena cava is normal in size with greater than 50%  respiratory variability, suggesting right atrial pressure of 3 mmHg.   Labs/Other Tests and Data Reviewed:    EKG:    Recent Labs: 05/23/2022: TSH 2.98 04/20/2023: BUN 6; Creatinine, Ser 0.44; Hemoglobin 11.1; Platelets 246; Potassium 3.8; Sodium 138   Recent Lipid Panel Lab Results  Component Value Date/Time   CHOL 222 (H) 05/23/2022 11:15 AM   CHOL 208 (H) 07/12/2020 09:17 AM   TRIG 140.0 05/23/2022 11:15 AM   HDL 77.20 05/23/2022 11:15 AM   HDL 74 07/12/2020 09:17 AM   CHOLHDL 3 05/23/2022 11:15 AM   LDLCALC 116 (H) 05/23/2022 11:15 AM   LDLCALC 112 (H) 07/12/2020 09:17 AM   LDLCALC 138 (H) 09/10/2019 02:56 PM    Wt Readings from Last 3 Encounters:  04/16/23 135 lb (61.2 kg)  03/27/23 135 lb (61.2 kg)  03/25/23 135  lb (61.2 kg)     Risk Assessment/Calculations:          Objective:    Vital Signs:  There were no vitals taken for this visit.   VITAL SIGNS:  reviewed GEN:  no acute distress PSYCH:  normal affect  ASSESSMENT & PLAN:    Essential hypertension which she states that her blood pressures have been running in the 1 teens even with home health checking her blood pressure.  She has continued on pain medication which is reasonable that her blood pressure is lower.  Toprol-XL 50 mg daily and atorvastatin 80 mg daily.  She is encouraged to continue to monitor blood pressure at home as well as 1 to 2 hours after taking her medication.  Shortness of breath has improved since her last visit.  Echocardiogram revealed an LVEF of 60 to 65%, no regional wall motion abnormalities, G1 DD, and mild mitral valve regurgitation.  Palpitations minimal.  She has had 1 or 2 times lasting for few seconds since being home, hospital.  She is continued on Toprol-XL 50 mg daily.  Mixed hyperlipidemia which she is continued on atorvastatin 20 mg daily.  LDL 12/10/2021.  Will need an updated lipid panel on return.  Disposition patient return to clinic to see MD/APP in 6 months or sooner if needed.            Time:   Today, I have spent 10 minutes with the patient with telehealth technology discussing the above problems.     Medication Adjustments/Labs and Tests Ordered: Current medicines are reviewed at length with the patient today.  Concerns regarding medicines are outlined above.   Tests Ordered: No orders of the defined types were placed in this encounter.   Medication Changes: No orders of the defined types were placed in this encounter.   Follow Up:  In Person in 6 month(s)  Signed, Conall Vangorder, NP  04/24/2023 2:00 PM    [facility]

## 2023-04-24 NOTE — Telephone Encounter (Signed)
Misty Stanley, a physical therapist from [facility], called to state she is faxing a request to [place] for orders, but she is also required to call us with the same request.  Misty Stanley states she is requesting orders for PT at one week one, two week two, one week one.  Misty Stanley states they also had ordered OT for patient but patient declined, and Misty Stanley agrees so they won't have an OT come out.

## 2023-04-29 ENCOUNTER — Ambulatory Visit (INDEPENDENT_AMBULATORY_CARE_PROVIDER_SITE_OTHER): Payer: Medicare Other | Admitting: Surgery

## 2023-04-29 ENCOUNTER — Encounter: Payer: Self-pay | Admitting: Surgery

## 2023-04-29 VITALS — BP 139/75 | HR 85 | Temp 98.3°F | Ht 59.0 in | Wt 130.8 lb

## 2023-04-29 DIAGNOSIS — Z9049 Acquired absence of other specified parts of digestive tract: Secondary | ICD-10-CM

## 2023-04-29 DIAGNOSIS — K5792 Diverticulitis of intestine, part unspecified, without perforation or abscess without bleeding: Secondary | ICD-10-CM

## 2023-04-29 NOTE — Patient Instructions (Addendum)
Advised to pursue a goal of 25 to 30 g of fiber daily.  Made aware that the majority of this may be through natural sources, but advised to be aware of actual consumption and to ensure minimal consumption by daily supplementation.  Various forms of supplements discussed.  Recommended Psyllium husk, that mixes well with applesauce, or the powder which goes down well shaken with chocolate milk.  Strongly advised to consume more fluids to ensure adequate hydration, instructed to watch color of urine to determine adequacy of hydration.  Clarity is pursued in urine output, and bowel activity that correlates to significant meal intake.   We need to avoid deferring having bowel movements, advised to take the time at the first sign of sensation, typically following meals, and in the morning.   Subsequent utilization of MiraLAX may be needed ensure at least daily movement, ideally twice daily bowel movements.  If multiple doses of MiraLAX are necessary utilize them. Never skip a day...  To be regular, we must do the above EVERY day.    If you have any concerns or questions, please feel free to call our office.   GENERAL POST-OPERATIVE PATIENT INSTRUCTIONS   WOUND CARE INSTRUCTIONS:  Keep a dry clean dressing on the wound if there is drainage. The initial bandage may be removed after 24 hours.  Once the wound has quit draining you may leave it open to air.  If clothing rubs against the wound or causes irritation and the wound is not draining you may cover it with a dry dressing during the daytime.  Try to keep the wound dry and avoid ointments on the wound unless directed to do so.  If the wound becomes bright red and painful or starts to drain infected material that is not clear, please contact your physician immediately.  If the wound is mildly pink and has a thick firm ridge underneath it, this is normal, and is referred to as a healing ridge.  This will resolve over the next 4-6 weeks.  BATHING: You may  shower if you have been informed of this by your surgeon. However, Please do not submerge in a tub, hot tub, or pool until incisions are completely sealed or have been told by your surgeon that you may do so.  DIET:  You may eat any foods that you can tolerate.  It is a good idea to eat a high fiber diet and take in plenty of fluids to prevent constipation.  If you do become constipated you may want to take a mild laxative or take ducolax tablets on a daily basis until your bowel habits are regular.  Constipation can be very uncomfortable, along with straining, after recent surgery.  ACTIVITY:  You are encouraged to cough and deep breath or use your incentive spirometer if you were given one, every 15-30 minutes when awake.  This will help prevent respiratory complications and low grade fevers post-operatively if you had a general anesthetic.  You may want to hug a pillow when coughing and sneezing to add additional support to the surgical area, if you had abdominal or chest surgery, which will decrease pain during these times.  You are encouraged to walk and engage in light activity for the next two weeks.  You should not lift more than 20 pounds for 6 weeks total after surgery as it could put you at increased risk for complications.  Twenty pounds is roughly equivalent to a plastic bag of groceries. At that time- Listen to  your body when lifting, if you have pain when lifting, stop and then try again in a few days. Soreness after doing exercises or activities of daily living is normal as you get back in to your normal routine.  MEDICATIONS:  Try to take narcotic medications and anti-inflammatory medications, such as tylenol, ibuprofen, naprosyn, etc., with food.  This will minimize stomach upset from the medication.  Should you develop nausea and vomiting from the pain medication, or develop a rash, please discontinue the medication and contact your physician.  You should not drive, make important decisions,  or operate machinery when taking narcotic pain medication.  SUNBLOCK Use sun block to incision area over the next year if this area will be exposed to sun. This helps decrease scarring and will allow you avoid a permanent darkened area over your incision.  QUESTIONS:  Please feel free to call our office if you have any questions, and we will be glad to assist you. [phone]

## 2023-04-29 NOTE — Progress Notes (Signed)
[facility] POST-OP OFFICE VISIT  04/29/2023  HPI: Maria Spencer is a 77 y.o. female 13 days s/p robotic sigmoid colectomy for diverticulitis.  She reports early satiety, moderate sharp stinging pains in the right and left sides but mostly the right lateral area.  Reports regular easy to move bowel movements, without pain.  Denies fevers and chills.  Notes she has been having some worsening of her back pain of recent.  Vital signs: BP 139/75   Pulse 85   Temp 98.3 F (36.8 C) (Oral)   Ht 4\' 11"  (1.499 m)   Wt 130 lb 12.8 oz (59.3 kg)   SpO2 99%   BMI 26.42 kg/m    Physical Exam: Constitutional: She appears well.  Has some discomfort from some recurring back pain. Abdomen: Incisions are clean dry and intact, her abdomen is nondistended and soft.  There is some incisional or lateral to incisional tenderness, but I believe is abdominal wall.  Because she has no guarding with any distracted palpation of the abdomen itself. Skin: Dermabond peeling away, no evidence of ecchymosis, cellulitis or mass.   SURGICAL PATHOLOGY CASE: [id number] PATIENT: Maria Spencer Surgical Pathology Report  Specimen Submitted: A. Colon, sigmoid  Clinical History: Diverticulitis  DIAGNOSIS: A.  COLON, SIGMOID; RESECTION: - RUPTURED DIVERTICULITIS WITH PERICOLONIC ABSCESS FORMATION. - UNREMARKABLE MARGINS OF RESECTION. - NEGATIVE FOR DYSPLASIA AND MALIGNANCY. - NINE BENIGN LYMPH NODES (0/9).  GROSS DESCRIPTION: A. Labeled: Sigmoid colon Received: Fresh Collection time: 12:30 PM on 04/16/2023 Placed into formalin time: 1:43 PM on 04/16/2023 Specimens received: Portion of sigmoid colon Measurements: 17 cm long by 4.2 cm in circumference Specimen Integrity: Grossly intact Orientation: The specimen is received unoriented.  1 resection margin is received stapled while the other is received open. External surface: The serosa is pink-tan, smooth, and glistening with scattered  central areas of roughening. Description: The mucosa is tan and folded with at least 18 diverticula, up to 1.5 cm in length by 0.7 cm in diameter.  Within the wall adjacent to multiple diverticula is an ill-defined 2.7 x 1.5 x 0.5 cm abscess.  A distinct connection between the abscess and adjacent diverticula is not grossly appreciated. The wall is diffusely thickened, up to 0.9 cm, with adjacent areas of fibrosis extending into the mesentery.  Additionally received in the same container are 2 fragments of bowel, 2.6 x 1.4 x 0.8 cm and 2 x 1.9 x 1.3 cm.  The identifiable serosal surfaces are tan, smooth, and glistening.  The mucosa is tan and folded and grossly unremarkable.  The larger fragment has a circumferential embedded blue suture. Lymph nodes: 9 lymph node candidates are identified, ranging from 0.3 to 0.6 cm in greatest dimension.    Assessment/Plan: This is a 77 y.o. female 13 days s/p robotic sigmoid colectomy for sigmoid diverticulitis.  Seems to be right on course, as anticipated.  Patient Active Problem List   Diagnosis Date Noted   Sigmoid diverticulitis 04/16/2023   Cervical facet joint pain 03/25/2023   Failed orthopedic implant (HCC) 03/12/2023   S/P spinal fusion 03/12/2023   S/P hardware removal 03/12/2023   Pharyngitis 12/17/2022   Acute recurrent maxillary sinusitis 12/17/2022   Weakness of both lower extremities 06/26/2022   Localized osteoarthritis of left knee 03/18/2022   Left lateral knee pain 10/11/2021   Partial tear of right hamstring 09/25/2021   Diverticulitis 03/23/2021   Osteoarthritis of knee (Left) 03/05/2021   Prediabetes 02/06/2021   Chronic bursitis of shoulder area (Right) 11/09/2020  Chronic shoulder pain (Right) 11/09/2020   Shoulder blade pain (Right) 11/09/2020   Scoliosis of thoracic spine, unspecified scoliosis type 11/08/2020   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/20/2017) 11/08/2020   Spondylosis without  myelopathy or radiculopathy, cervical region 10/26/2020   Abnormal MRI, cervical spine (08/06/2022) 10/23/2020   Cervical facet syndrome (Bilateral) 10/23/2020   Cervicogenic headache (Bilateral) 10/23/2020   Occipital neuralgia (greater occipital nerve) (Bilateral) 10/23/2020   Cervico-occipital neuralgia (Bilateral) 10/23/2020   Cervical facet hypertrophy (Multilevel) (Bilateral) 10/23/2020   Tear of right gluteus minimus tendon 10/06/2020   Tear of right gluteus medius tendon 10/06/2020   Osteoarthritis of AC (acromioclavicular) joints (Bilateral) 10/05/2020   Chronic Acromioclavicular (AC) joint pain (Bilateral) 10/05/2020   Osteoarthritis of glenohumeral joint (Left) 10/05/2020   Chronic shoulder pain (Bilateral) (R>L) 10/02/2020   Chronic shoulder pain after shoulder replacement (Right) 10/02/2020   Cervicalgia 09/13/2020   Chronic upper extremity pain (Right) 09/13/2020   Cervical spondylosis with radiculopathy (Right) 09/13/2020   Cervical radiculopathy at C7 (Right) 09/13/2020   Atypical chest pain 07/13/2020   Diverticular disease 07/13/2020   Shortness of breath 07/13/2020   Palpitations 07/13/2020   GERD (gastroesophageal reflux disease) 07/13/2020   Insomnia 07/11/2020   Breast cancer (HCC) 06/22/2020   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) 06/22/2020   DDD (degenerative disc disease), cervical 06/22/2020   DDD (degenerative disc disease), thoracic 06/22/2020   DDD (degenerative disc disease), lumbar 06/22/2020   Displacement of thoracic intervertebral disc (C6-T3, T4-5, T6-7, T8-9) 06/22/2020   Non-traumatic compression fracture of T8 thoracic vertebra, sequela 06/22/2020   Spondylosis without myelopathy or radiculopathy, thoracic region 05/11/2020   Closed wedge compression fracture of T8 vertebra (HCC) 05/11/2020   Osteoarthritis of hips (Bilateral) 05/11/2020   Abnormal thoracolumbar CT myelogram (01/05/2020) 05/11/2020   Chronic pain syndrome 05/08/2020    Colon cancer screening 05/08/2020   Disorder of skeletal system 05/08/2020   Problems influencing health status 05/08/2020   Chronic lower extremity pain (3ry area of Pain) (Right) 05/08/2020   Chronic knee pain (4th area of Pain) (Posterior) (Left) 05/08/2020   Thoracic facet syndrome (Bilateral) 05/08/2020   Chronic sacroiliac joint pain (Bilateral) 05/08/2020   Osteoporosis 01/14/2020   Chronic low back pain with sciatica 01/14/2020   Chronic thoracic back pain (1ry area of Pain) (Bilateral) (R>L) 01/14/2020   Fusion of spine of lumbosacral region 12/13/2019   Lumbosacral spondylosis with radiculopathy 12/13/2019   S/P LASIK (laser assisted in situ keratomileusis) of both eyes 11/01/2019   Aortic atherosclerosis (HCC) 09/17/2019   Chronic hip pain (2ry area of Pain) (Bilateral) (R>L) 09/13/2019   Age-related nuclear cataract of right eye 08/19/2019   Age-related nuclear cataract of left eye 08/19/2019   Recurrent incisional hernia with incarceration & SBO s/p reduction 05/28/2018   Arthritis 01/07/2018   Chronic UTI 12/31/2017   IBS (irritable bowel syndrome) 12/29/2017   History of breast cancer 12/29/2017   Baker's cyst of knee (Left) 12/29/2017   Scoliosis of cervical region due to degenerative disease of spine in adult 09/18/2017   Hypothyroidism 02/15/2013   Hyperlipidemia 02/15/2013   Elevated blood pressure reading with diagnosis of hypertension 02/15/2013   Essential hypertension 02/15/2013    -Offered a month follow-up regarding any other concerns or issues.  Will be glad to respond to any new concerns in the interim.   Campbell Lerner M.D., FACS 04/29/2023, 1:36 PM

## 2023-04-30 DIAGNOSIS — M1389 Other specified arthritis, multiple sites: Secondary | ICD-10-CM | POA: Diagnosis not present

## 2023-04-30 DIAGNOSIS — I340 Nonrheumatic mitral (valve) insufficiency: Secondary | ICD-10-CM | POA: Diagnosis not present

## 2023-04-30 DIAGNOSIS — I4719 Other supraventricular tachycardia: Secondary | ICD-10-CM | POA: Diagnosis not present

## 2023-04-30 DIAGNOSIS — M810 Age-related osteoporosis without current pathological fracture: Secondary | ICD-10-CM | POA: Diagnosis not present

## 2023-04-30 DIAGNOSIS — Z48815 Encounter for surgical aftercare following surgery on the digestive system: Secondary | ICD-10-CM | POA: Diagnosis not present

## 2023-04-30 DIAGNOSIS — Z853 Personal history of malignant neoplasm of breast: Secondary | ICD-10-CM | POA: Diagnosis not present

## 2023-05-01 ENCOUNTER — Ambulatory Visit
Admission: RE | Admit: 2023-05-01 | Discharge: 2023-05-01 | Disposition: A | Discharge status: Home / Self Care | Payer: Medicare Other | Source: Ambulatory Visit | Attending: Neurosurgery | Admitting: Neurosurgery

## 2023-05-01 ENCOUNTER — Ambulatory Visit
Admission: RE | Admit: 2023-05-01 | Discharge: 2023-05-01 | Disposition: A | Discharge status: Home / Self Care | Payer: Medicare Other | Attending: Neurosurgery | Admitting: Neurosurgery

## 2023-05-01 ENCOUNTER — Encounter: Payer: Self-pay | Admitting: Neurosurgery

## 2023-05-01 ENCOUNTER — Encounter: Payer: Self-pay | Admitting: Family Medicine

## 2023-05-01 ENCOUNTER — Other Ambulatory Visit: Payer: Self-pay | Admitting: Family Medicine

## 2023-05-01 ENCOUNTER — Ambulatory Visit (INDEPENDENT_AMBULATORY_CARE_PROVIDER_SITE_OTHER): Payer: Medicare Other | Admitting: Neurosurgery

## 2023-05-01 ENCOUNTER — Ambulatory Visit (INDEPENDENT_AMBULATORY_CARE_PROVIDER_SITE_OTHER): Payer: Medicare Other | Admitting: Family Medicine

## 2023-05-01 VITALS — BP 128/76 | HR 77 | Ht 59.0 in | Wt 132.0 lb

## 2023-05-01 VITALS — BP 152/73 | HR 78 | Temp 98.5°F | Wt 131.4 lb

## 2023-05-01 DIAGNOSIS — Z09 Encounter for follow-up examination after completed treatment for conditions other than malignant neoplasm: Secondary | ICD-10-CM

## 2023-05-01 DIAGNOSIS — M5459 Other low back pain: Secondary | ICD-10-CM

## 2023-05-01 DIAGNOSIS — G8929 Other chronic pain: Secondary | ICD-10-CM | POA: Diagnosis not present

## 2023-05-01 DIAGNOSIS — M8588 Other specified disorders of bone density and structure, other site: Secondary | ICD-10-CM | POA: Diagnosis not present

## 2023-05-01 DIAGNOSIS — M810 Age-related osteoporosis without current pathological fracture: Secondary | ICD-10-CM

## 2023-05-01 DIAGNOSIS — T84498D Other mechanical complication of other internal orthopedic devices, implants and grafts, subsequent encounter: Secondary | ICD-10-CM

## 2023-05-01 DIAGNOSIS — M5440 Lumbago with sciatica, unspecified side: Secondary | ICD-10-CM | POA: Insufficient documentation

## 2023-05-01 DIAGNOSIS — R7309 Other abnormal glucose: Secondary | ICD-10-CM | POA: Diagnosis not present

## 2023-05-01 DIAGNOSIS — E538 Deficiency of other specified B group vitamins: Secondary | ICD-10-CM

## 2023-05-01 DIAGNOSIS — E039 Hypothyroidism, unspecified: Secondary | ICD-10-CM | POA: Diagnosis not present

## 2023-05-01 DIAGNOSIS — I700 Atherosclerosis of aorta: Secondary | ICD-10-CM

## 2023-05-01 DIAGNOSIS — Z9889 Other specified postprocedural states: Secondary | ICD-10-CM | POA: Diagnosis not present

## 2023-05-01 DIAGNOSIS — E785 Hyperlipidemia, unspecified: Secondary | ICD-10-CM | POA: Diagnosis not present

## 2023-05-01 DIAGNOSIS — Z23 Encounter for immunization: Secondary | ICD-10-CM

## 2023-05-01 DIAGNOSIS — E559 Vitamin D deficiency, unspecified: Secondary | ICD-10-CM

## 2023-05-01 DIAGNOSIS — M545 Low back pain, unspecified: Secondary | ICD-10-CM | POA: Diagnosis not present

## 2023-05-01 DIAGNOSIS — I10 Essential (primary) hypertension: Secondary | ICD-10-CM | POA: Diagnosis not present

## 2023-05-01 DIAGNOSIS — C50919 Malignant neoplasm of unspecified site of unspecified female breast: Secondary | ICD-10-CM

## 2023-05-01 DIAGNOSIS — Z1231 Encounter for screening mammogram for malignant neoplasm of breast: Secondary | ICD-10-CM

## 2023-05-01 DIAGNOSIS — K5790 Diverticulosis of intestine, part unspecified, without perforation or abscess without bleeding: Secondary | ICD-10-CM | POA: Diagnosis not present

## 2023-05-01 DIAGNOSIS — M25552 Pain in left hip: Secondary | ICD-10-CM | POA: Diagnosis not present

## 2023-05-01 MED ORDER — TELMISARTAN 80 MG PO TABS: 80.0000 mg | ORAL_TABLET | Freq: Every day | ORAL | 3 refills | Status: DC

## 2023-05-01 MED ORDER — TIZANIDINE HCL 4 MG PO TABS: 2.0000 mg | ORAL_TABLET | Freq: Three times a day (TID) | ORAL | 1 refills | Status: DC | PRN

## 2023-05-01 MED ORDER — OMEPRAZOLE 20 MG PO CPDR: 20.0000 mg | DELAYED_RELEASE_CAPSULE | Freq: Every day | ORAL | 3 refills | Status: DC

## 2023-05-01 MED ORDER — ATORVASTATIN CALCIUM 20 MG PO TABS: 20.0000 mg | ORAL_TABLET | Freq: Every day | ORAL | 3 refills | Status: DC

## 2023-05-01 MED ORDER — LEVOTHYROXINE SODIUM 88 MCG PO TABS
88.0000 ug | ORAL_TABLET | Freq: Every day | ORAL | 3 refills | Status: DC
Start: 2023-05-01 — End: 2023-08-11

## 2023-05-01 MED ORDER — NABUMETONE 500 MG PO TABS: 500.0000 mg | ORAL_TABLET | Freq: Every day | ORAL | 0 refills | Status: DC

## 2023-05-01 NOTE — Progress Notes (Signed)
SUBJECTIVE:   Chief Complaint  Patient presents with   transfer of care   HPI Patient presents to clinic to transfer care.  Patient's main concern is chronic pain.  Was recently seen by neurosurgery and was prescribed tizanidine.  She has not taken this medication.  She also endorses having missed out on a muscle biopsy as she was recently hospitalized 2 weeks ago for robotic laparoscopic sigmoidectomy.  Hypothyroid Asymptomatic.  Currently takes levothyroxine 88 mcg daily.  Mechanical back pain Follows with Dr. Marcell Barlow, neurosurgery.  Recently prescribed muscle relaxants and outpatient physical therapy.  If no improvement plan to consider spinal cord stimulation.  She has not used methocarbamol or at the tail.  Patient was seen today by Dr. Marcell Barlow and has not picked up tizanidine.  She is requesting pain medication.   Osteoporosis Follows with endocrinology.  Has been on Reclast infusion yearly x 3 years.  Hypertension Asymptomatic.  Takes metoprolol XL 50 mg daily and Micardis 80 mg daily.  Tolerating medication well.  Hyperlipidemia Takes Lipitor 20 mg daily.  Tolerating well.  Denies myalgias.    PERTINENT PMH / PSH: Hypothyroid  Left side lower back pain Recent colectomy   OBJECTIVE:  BP 128/76 (BP Location: Left Arm, Patient Position: Sitting, Cuff Size: Normal)   Pulse 77   Ht 4\' 11"  (1.499 m)   Wt 132 lb (59.9 kg)   SpO2 98%   BMI 26.66 kg/m    Physical Exam Constitutional:      General: She is not in acute distress.    Appearance: Normal appearance. She is normal weight. She is not ill-appearing.  HENT:     Head: Normocephalic.     Right Ear: Tympanic membrane, ear canal and external ear normal.     Left Ear: Tympanic membrane, ear canal and external ear normal.  Eyes:     Conjunctiva/sclera: Conjunctivae normal.  Cardiovascular:     Rate and Rhythm: Normal rate and regular rhythm.     Pulses: Normal pulses.     Heart sounds: Normal heart  sounds.  Pulmonary:     Effort: Pulmonary effort is normal.     Breath sounds: Normal breath sounds.  Abdominal:     General: Bowel sounds are normal.  Lymphadenopathy:     Cervical: No cervical adenopathy.  Neurological:     Mental Status: She is alert. Mental status is at baseline.  Psychiatric:        Mood and Affect: Mood normal.        Behavior: Behavior normal.        Thought Content: Thought content normal.        Judgment: Judgment normal.     ASSESSMENT/PLAN:  Breast cancer screening by mammogram -     3D Screening Mammogram, Left and Right; Future  Hyperlipidemia, unspecified hyperlipidemia type Assessment & Plan: Chronic Continue Lipitor 20 mg Check fasting lipids  Orders: -     Atorvastatin Calcium; Take 1 tablet (20 mg total) by mouth daily.  Dispense: 90 tablet; Refill: 3 -     Lipid panel; Future  Aortic atherosclerosis (HCC) Assessment & Plan: Chronic On statin therapy and tolerating well.   Hypertension, unspecified type -     Telmisartan; Take 1 tablet (80 mg total) by mouth daily.  Dispense: 90 tablet; Refill: 3  Hypothyroidism, unspecified type Assessment & Plan: Chronic. Continue levothyroxine 88 mcg daily Check TSH  Orders: -     Levothyroxine Sodium; Take 1 tablet (88 mcg total) by  mouth daily before breakfast. 30 minutes  Dispense: 90 tablet; Refill: 3 -     TSH; Future  Malignant neoplasm of female breast, unspecified estrogen receptor status, unspecified laterality, unspecified site of breast [facility]) Assessment & Plan: In remission Yearly mammograms   Essential hypertension Assessment & Plan: Chronic.  Well-controlled. Continue Micardis 80 mg daily Continue metoprolol 50 mg daily Check c-Met  Orders: -     Comprehensive metabolic panel; Future -     CBC with Differential/Platelet; Future  Abnormal glucose -     Hemoglobin A1c; Future  Vitamin B 12 deficiency -     Vitamin B12; Future  Vitamin D deficiency -     VITAMIN  D 25 Hydroxy (Vit-D Deficiency, Fractures); Future  Need for vaccination against Streptococcus pneumoniae -     Pneumococcal conjugate vaccine 20-valent  Osteoporosis, unspecified osteoporosis type, unspecified pathological fracture presence Assessment & Plan: Chronic.  On yearly Reclast infusion. 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 Marcell Barlow.  Last evaluated earlier today.  Was prescribed tizanidine.  Recommended if no improvement to consider spinal stimulator. Patient requesting pain medication. Prescribed nabumetone 500 mg daily, no indication of contraindication due to sulfa allergy however per receiving pharmacy contraindicated.  Medication discontinued.  Patient made aware.  Recommend she follow-up with neurosurgery.  Offered pain management consult, politely declined    PDMP reviewed  Return in about 4 weeks (around 05/29/2023) for PCP.  Dana Allan, MD

## 2023-05-01 NOTE — Progress Notes (Signed)
   REFERRING PHYSICIAN:  Dana Allan, Md [address] [place],  [place] [address]  DOS: 03/12/23 removal of iliac screws   HISTORY OF PRESENT ILLNESS: Maria Spencer is status post iliac screw removal.  She is still having some left-sided lower back pain.  She has no radicular pain.  She is doing home health physical therapy because she just had a colectomy.  Unfortunately, she is no better than she was prior to surgery.    PHYSICAL EXAMINATION:  General: Patient is well developed, well nourished, calm, collected, and in no apparent distress.   NEUROLOGICAL:  General: In no acute distress.   Awake, alert, oriented to person, place, and time.  Pupils equal round and reactive to light.  Facial tone is symmetric.    Strength:            Side Iliopsoas Quads Hamstring PF DF EHL  R 5 5 5 5 5 5   L 5 5 5 5 5 5    Incision c/d/i well-healed   ROS (Neurologic):  Negative except as noted above  IMAGING: No interval imaging to review today  ASSESSMENT/PLAN:  Maria Spencer is doing fair.  She is having mechanical back pain.  I would like to start her on some muscle relaxants.  I will start her on outpatient physical therapy.  If she does not improve over the next few weeks, we may have to consider spinal cord stimulation.  Will get x-rays today to make sure that she has not had a fracture.    Venetia Night MD Department of neurosurgery

## 2023-05-01 NOTE — Patient Instructions (Addendum)
It was a pleasure meeting you today. Thank you for allowing me to take part in your health care.  Our goals for today as we discussed include:  Pneumonia 20 given today  Do not take ibuprofen with Relafen Start Omeprazole daily  Follow up in 4 weeks  Schedule Medicare Annual Wellness Visit   Referral sent for Mammogram . Please call to schedule appointment. [facility] [address] [place], [place] [address] [phone]      If you have any questions or concerns, please do not hesitate to call the office at [phone].  I look forward to our next visit and until then take care and stay safe.  Regards,   Dana Allan, MD   [facility]

## 2023-05-02 ENCOUNTER — Telehealth: Payer: Self-pay

## 2023-05-02 ENCOUNTER — Telehealth: Payer: Self-pay | Admitting: Family Medicine

## 2023-05-02 ENCOUNTER — Other Ambulatory Visit: Payer: Self-pay | Admitting: Family Medicine

## 2023-05-02 DIAGNOSIS — I4719 Other supraventricular tachycardia: Secondary | ICD-10-CM | POA: Diagnosis not present

## 2023-05-02 DIAGNOSIS — M1389 Other specified arthritis, multiple sites: Secondary | ICD-10-CM | POA: Diagnosis not present

## 2023-05-02 DIAGNOSIS — Z48815 Encounter for surgical aftercare following surgery on the digestive system: Secondary | ICD-10-CM | POA: Diagnosis not present

## 2023-05-02 DIAGNOSIS — I340 Nonrheumatic mitral (valve) insufficiency: Secondary | ICD-10-CM | POA: Diagnosis not present

## 2023-05-02 DIAGNOSIS — M810 Age-related osteoporosis without current pathological fracture: Secondary | ICD-10-CM | POA: Diagnosis not present

## 2023-05-02 DIAGNOSIS — Z853 Personal history of malignant neoplasm of breast: Secondary | ICD-10-CM | POA: Diagnosis not present

## 2023-05-02 NOTE — Telephone Encounter (Signed)
Patient went to pick up her prescription that Dr Clent Ridges wanted her to take, nabumetone (RELAFEN) 500 MG tablet . Pharmacy told her that it has sulfur in the medication and she is allergic to sulfur.

## 2023-05-02 NOTE — Telephone Encounter (Signed)
Plan of care forms were received that need provider signature   Forms placed in provider folder

## 2023-05-05 DIAGNOSIS — M1389 Other specified arthritis, multiple sites: Secondary | ICD-10-CM | POA: Diagnosis not present

## 2023-05-05 DIAGNOSIS — I340 Nonrheumatic mitral (valve) insufficiency: Secondary | ICD-10-CM | POA: Diagnosis not present

## 2023-05-05 DIAGNOSIS — Z923 Personal history of irradiation: Secondary | ICD-10-CM | POA: Diagnosis not present

## 2023-05-05 DIAGNOSIS — Z853 Personal history of malignant neoplasm of breast: Secondary | ICD-10-CM | POA: Diagnosis not present

## 2023-05-05 DIAGNOSIS — E039 Hypothyroidism, unspecified: Secondary | ICD-10-CM | POA: Diagnosis not present

## 2023-05-05 DIAGNOSIS — Z48815 Encounter for surgical aftercare following surgery on the digestive system: Secondary | ICD-10-CM | POA: Diagnosis not present

## 2023-05-05 DIAGNOSIS — I4719 Other supraventricular tachycardia: Secondary | ICD-10-CM | POA: Diagnosis not present

## 2023-05-05 DIAGNOSIS — M810 Age-related osteoporosis without current pathological fracture: Secondary | ICD-10-CM | POA: Diagnosis not present

## 2023-05-05 DIAGNOSIS — E785 Hyperlipidemia, unspecified: Secondary | ICD-10-CM | POA: Diagnosis not present

## 2023-05-05 DIAGNOSIS — K219 Gastro-esophageal reflux disease without esophagitis: Secondary | ICD-10-CM | POA: Diagnosis not present

## 2023-05-05 DIAGNOSIS — Z79891 Long term (current) use of opiate analgesic: Secondary | ICD-10-CM | POA: Diagnosis not present

## 2023-05-05 NOTE — Telephone Encounter (Signed)
Called pt and she said that Dr. Clent Ridges prescribed her a anti inflammatory nerve pill, but she is unable to take because it has Sulfa in the medication. She did say that the muscle relaxer that the orthopedics prescribed has worked for the muscle but nothing for the nerve.

## 2023-05-08 ENCOUNTER — Other Ambulatory Visit (INDEPENDENT_AMBULATORY_CARE_PROVIDER_SITE_OTHER): Payer: Medicare Other

## 2023-05-08 DIAGNOSIS — R7309 Other abnormal glucose: Secondary | ICD-10-CM | POA: Diagnosis not present

## 2023-05-08 DIAGNOSIS — E538 Deficiency of other specified B group vitamins: Secondary | ICD-10-CM

## 2023-05-08 DIAGNOSIS — E559 Vitamin D deficiency, unspecified: Secondary | ICD-10-CM | POA: Diagnosis not present

## 2023-05-08 DIAGNOSIS — I10 Essential (primary) hypertension: Secondary | ICD-10-CM | POA: Diagnosis not present

## 2023-05-08 DIAGNOSIS — E785 Hyperlipidemia, unspecified: Secondary | ICD-10-CM | POA: Diagnosis not present

## 2023-05-08 DIAGNOSIS — E039 Hypothyroidism, unspecified: Secondary | ICD-10-CM

## 2023-05-08 LAB — TSH: TSH: 3.51 u[IU]/mL (ref 0.35–5.50)

## 2023-05-08 LAB — LIPID PANEL
Cholesterol: 173 mg/dL (ref 0–200)
HDL: 50.5 mg/dL (ref >39.00)
LDL Cholesterol: 97 mg/dL (ref 0–99)
NonHDL: 122.58
Total CHOL/HDL Ratio: 3
Triglycerides: 127 mg/dL (ref 0.0–149.0)
VLDL: 25.4 mg/dL (ref 0.0–40.0)

## 2023-05-08 LAB — CBC WITH DIFFERENTIAL/PLATELET
Basophils Absolute: 0 10*3/uL (ref 0.0–0.1)
Basophils Relative: 0.6 % (ref 0.0–3.0)
Eosinophils Absolute: 0.2 10*3/uL (ref 0.0–0.7)
Eosinophils Relative: 3.9 % (ref 0.0–5.0)
HCT: 37.4 % (ref 36.0–46.0)
Hemoglobin: 12.1 g/dL (ref 12.0–15.0)
Lymphocytes Relative: 19.9 % (ref 12.0–46.0)
Lymphs Abs: 1.1 10*3/uL (ref 0.7–4.0)
MCHC: 32.3 g/dL (ref 30.0–36.0)
MCV: 85.3 fl (ref 78.0–100.0)
Monocytes Absolute: 0.4 10*3/uL (ref 0.1–1.0)
Monocytes Relative: 6.6 % (ref 3.0–12.0)
Neutro Abs: 3.8 10*3/uL (ref 1.4–7.7)
Neutrophils Relative %: 69 % (ref 43.0–77.0)
Platelets: 266 10*3/uL (ref 150.0–400.0)
RBC: 4.39 Mil/uL (ref 3.87–5.11)
RDW: 15.4 % (ref 11.5–15.5)
WBC: 5.5 10*3/uL (ref 4.0–10.5)

## 2023-05-08 LAB — VITAMIN B12: Vitamin B-12: 285 pg/mL (ref 211–911)

## 2023-05-08 LAB — VITAMIN D 25 HYDROXY (VIT D DEFICIENCY, FRACTURES): VITD: 95.34 ng/mL (ref 30.00–100.00)

## 2023-05-08 LAB — COMPREHENSIVE METABOLIC PANEL
ALT: 11 U/L (ref 0–35)
AST: 16 U/L (ref 0–37)
Albumin: 4 g/dL (ref 3.5–5.2)
Alkaline Phosphatase: 124 U/L — ABNORMAL HIGH (ref 39–117)
BUN: 16 mg/dL (ref 6–23)
CO2: 29 mEq/L (ref 19–32)
Calcium: 9.1 mg/dL (ref 8.4–10.5)
Chloride: 103 mEq/L (ref 96–112)
Creatinine, Ser: 0.5 mg/dL (ref 0.40–1.20)
GFR: 91.09 mL/min (ref >60.00)
Glucose, Bld: 102 mg/dL — ABNORMAL HIGH (ref 70–99)
Potassium: 3.8 mEq/L (ref 3.5–5.1)
Sodium: 139 mEq/L (ref 135–145)
Total Bilirubin: 0.4 mg/dL (ref 0.2–1.2)
Total Protein: 7.1 g/dL (ref 6.0–8.3)

## 2023-05-08 LAB — HEMOGLOBIN A1C: Hgb A1c MFr Bld: 6.1 % (ref 4.6–6.5)

## 2023-05-11 ENCOUNTER — Encounter: Payer: Self-pay | Admitting: Family Medicine

## 2023-05-11 DIAGNOSIS — M5459 Other low back pain: Secondary | ICD-10-CM | POA: Insufficient documentation

## 2023-05-11 NOTE — Assessment & Plan Note (Signed)
In remission Yearly mammograms

## 2023-05-11 NOTE — Assessment & Plan Note (Signed)
Chronic. Continue levothyroxine 88 mcg daily Check TSH

## 2023-05-11 NOTE — Assessment & Plan Note (Signed)
Status post recent robotic laparoscopic sigmoidectomy 05/24 Follow-up with general surgery as scheduled

## 2023-05-11 NOTE — Assessment & Plan Note (Signed)
Chronic issue Follows with neurosurgery, Dr. Marcell Barlow.  Last evaluated earlier today.  Was prescribed tizanidine.  Recommended if no improvement to consider spinal stimulator. Patient requesting pain medication. Prescribed nabumetone 500 mg daily, no indication of contraindication due to sulfa allergy however per receiving pharmacy contraindicated.  Medication discontinued.  Patient made aware.  Recommend Maria Spencer follow-up with neurosurgery.  Offered pain management consult, politely declined

## 2023-05-11 NOTE — Assessment & Plan Note (Signed)
Chronic On statin therapy and tolerating well.

## 2023-05-11 NOTE — Assessment & Plan Note (Signed)
Chronic Continue Lipitor 20 mg Check fasting lipids

## 2023-05-11 NOTE — Assessment & Plan Note (Signed)
Chronic.  Well-controlled. Continue Micardis 80 mg daily Continue metoprolol 50 mg daily Check c-Met

## 2023-05-11 NOTE — Assessment & Plan Note (Signed)
Chronic.  On yearly Reclast infusion. Follows with endocrinology

## 2023-05-20 ENCOUNTER — Ambulatory Visit: Payer: Medicare Other | Admitting: Family Medicine

## 2023-05-23 ENCOUNTER — Other Ambulatory Visit: Payer: Self-pay | Admitting: Neurosurgery

## 2023-06-03 ENCOUNTER — Encounter: Payer: Self-pay | Admitting: Surgery

## 2023-06-03 ENCOUNTER — Encounter: Payer: Medicare Other | Admitting: Neurosurgery

## 2023-06-03 ENCOUNTER — Ambulatory Visit (INDEPENDENT_AMBULATORY_CARE_PROVIDER_SITE_OTHER): Payer: Medicare Other | Admitting: Surgery

## 2023-06-03 VITALS — BP 170/82 | HR 76 | Temp 98.1°F | Ht 59.0 in | Wt 130.2 lb

## 2023-06-03 DIAGNOSIS — Z09 Encounter for follow-up examination after completed treatment for conditions other than malignant neoplasm: Secondary | ICD-10-CM

## 2023-06-03 DIAGNOSIS — Z9049 Acquired absence of other specified parts of digestive tract: Secondary | ICD-10-CM

## 2023-06-03 DIAGNOSIS — K5792 Diverticulitis of intestine, part unspecified, without perforation or abscess without bleeding: Secondary | ICD-10-CM

## 2023-06-03 NOTE — Patient Instructions (Signed)
If you have any concerns or questions, please feel free to call our office.   Minimally Invasive Partial Colectomy, Adult, Care After The following information offers guidance on how to care for yourself after your procedure. Your health care provider may also give you more specific instructions. If you have problems or questions, contact your health care provider. What can I expect after the surgery? After the procedure, it is common to have: Pain, bruising, and swelling. Bloating. Weakness and tiredness (fatigue). Changes to your bowel movements, especially having bowel movements more often. Follow these instructions at home: Medicines Take over-the-counter and prescription medicines only as told by your health care provider. If you were prescribed an antibiotic medicine, take it as told by your health care provider. Do not stop using the antibiotic even if you start to feel better. Ask your health care provider if the medicine prescribed to you: Requires you to avoid driving or using machinery. Can cause constipation. You may need to take these actions to prevent or treat constipation: Drink enough fluids to keep your urine pale yellow. Take over-the-counter or prescription medicines. Limit foods that are high in fat and processed sugars, such as fried or sweet foods. Eating and drinking Follow instructions from your health care provider about what you may eat and drink. Do not drink alcohol if your health care provider tells you not to drink. Eat a low-fiber diet for the first 4 weeks after surgery or as told by your health care provider.. Most people on a low-fiber eating plan should eat less than 10 grams (g) of fiber a day. Follow recommendations from your health care provider or dietitian about how much fiber you should have each day. Always check food labels to know the fiber content of packaged foods. In general, a low-fiber food will have fewer than 2 g of fiber per serving. In  general, try to avoid whole grains, raw fruits and vegetables, dried fruit, tough cuts of meat, nuts, and seeds. Incision care  Follow instructions from your health care provider about how to take care of your incisions. Make sure you: Wash your hands with soap and water for at least 20 seconds before and after you change your bandage (dressing). If soap and water are not available, use hand sanitizer. Change your dressing as told by your health care provider. Leave stitches (sutures), skin glue, or adhesive strips in place. These skin closures may need to stay in place for 2 weeks or longer. If adhesive strip edges start to loosen and curl up, you may trim the loose edges. Do not remove adhesive strips completely unless your health care provider tells you to do that. Check your incision area every day for signs of infection. Check for: More redness, swelling, or pain. Fluid or blood. Warmth. Pus or a bad smell. Activity Rest as told by your health care provider. Avoid sitting for a long time without moving. Get up to take short walks every 1-2 hours. This is important to improve blood flow and breathing. Ask for help if you feel weak or unsteady. You may have to avoid lifting. Ask your health care provider how much you can safely lift. Return to your normal activities as told by your health care provider. Ask your health care provider what activities are safe for you. General instructions Do not use any products that contain nicotine or tobacco. These products include cigarettes, chewing tobacco, and vaping devices, such as e-cigarettes. If you need help quitting, ask your health care   provider. Do not take baths, swim, or use a hot tub until your health care provider approves. Ask your health care provider if you may take showers. You may only be allowed to take sponge baths. Wear compression stockings as told by your health care provider. These stockings help to prevent blood clots and reduce  swelling in your legs. Keep all follow-up visits. This is important to monitor healing and check for any complications. Contact a health care provider if: Medicine is not controlling your pain. You have chills or fever. You have any signs of infection in your incision areas. You have a persistent cough. You have nausea or vomiting. You develop a rash. You have not had a bowel movement in 3 days. Get help right away if: You have severe pain. Your incisions break open after sutures or staples have been removed. You are bleeding from your rectum or have blood in your stool. You have a warm, tender swelling in your leg. You have chest pain or trouble breathing. You have increased swelling in the abdomen. You feel light-headed or you faint. These symptoms may be an emergency. Get help right away. Call 911. Do not wait to see if the symptoms will go away. Do not drive yourself to the hospital. Summary After surgery, it is common to have some pain, bruising, swelling, bloating, tiredness, weakness, or changes to your bowel movements. Follow instructions from your health care provider about what to eat and drink. Return to your normal activities as told by your health care provider. Check your incision area every day for signs of infection. Get help right away if you have chest pain or trouble breathing. This information is not intended to replace advice given to you by your health care provider. Make sure you discuss any questions you have with your health care provider. Document Revised: 02/27/2022 Document Reviewed: 02/27/2022 Elsevier Patient Education  2024 Elsevier Inc.  

## 2023-06-03 NOTE — Progress Notes (Signed)
[facility] POST-OP OFFICE VISIT  06/03/2023  HPI: Maria Spencer is a 77 y.o. female s/p robotic assisted sigmoid colectomy, on Apr 16, 2023.  She reports her bowel movements are slightly frequent after a meal.  She reports she has some rectal urgency after a greasy meal of hamburger and fries.  She denies nausea, vomiting, fevers or chills.  She will occasionally take some Imodium to help diminished the bowel activity, but otherwise there is no pain with bowel movements.  She has a slight amount of pain in the largest port site which was likely her stapler site and right lower quadrant.  Vital signs: BP (!) 170/82   Pulse 76   Temp 98.1 F (36.7 C) (Oral)   Ht 4\' 11"  (1.499 m)   Wt 130 lb 3.2 oz (59.1 kg)   SpO2 97%   BMI 26.30 kg/m    Physical Exam: Constitutional: Appears well Abdomen: Soft nontender, benign.  Right lower quadrant scar with slight tenderness under it without mass or evidence of fascial defect. Skin: All incisions are well-healed.  Assessment/Plan: This is a 77 y.o. female  s/p robotic sigmoid colectomy from Apr 16, 2023.  Progressing well with expected post operative bowel habit changes.  She has no history of right upper quadrant or epigastric pain postprandially.  She does have her gallbladder.  Patient Active Problem List   Diagnosis Date Noted   Mechanical low back pain 05/11/2023   Status post laparoscopic colectomy 04/29/2023   Sigmoid diverticulitis 04/16/2023   Cervical facet joint pain 03/25/2023   Failed orthopedic implant (HCC) 03/12/2023   S/P spinal fusion 03/12/2023   S/P hardware removal 03/12/2023   Pharyngitis 12/17/2022   Acute recurrent maxillary sinusitis 12/17/2022   Weakness of both lower extremities 06/26/2022   Localized osteoarthritis of left knee 03/18/2022   Left lateral knee pain 10/11/2021   Partial tear of right hamstring 09/25/2021   Diverticulitis 03/23/2021   Osteoarthritis of knee (Left) 03/05/2021    Prediabetes 02/06/2021   Chronic bursitis of shoulder area (Right) 11/09/2020   Chronic shoulder pain (Right) 11/09/2020   Shoulder blade pain (Right) 11/09/2020   Scoliosis of thoracic spine, unspecified scoliosis type 11/08/2020   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/20/2017) 11/08/2020   Spondylosis without myelopathy or radiculopathy, cervical region 10/26/2020   Abnormal MRI, cervical spine (08/06/2022) 10/23/2020   Cervical facet syndrome (Bilateral) 10/23/2020   Cervicogenic headache (Bilateral) 10/23/2020   Occipital neuralgia (greater occipital nerve) (Bilateral) 10/23/2020   Cervico-occipital neuralgia (Bilateral) 10/23/2020   Cervical facet hypertrophy (Multilevel) (Bilateral) 10/23/2020   Tear of right gluteus minimus tendon 10/06/2020   Tear of right gluteus medius tendon 10/06/2020   Osteoarthritis of AC (acromioclavicular) joints (Bilateral) 10/05/2020   Chronic Acromioclavicular (AC) joint pain (Bilateral) 10/05/2020   Osteoarthritis of glenohumeral joint (Left) 10/05/2020   Chronic shoulder pain (Bilateral) (R>L) 10/02/2020   Chronic shoulder pain after shoulder replacement (Right) 10/02/2020   Cervicalgia 09/13/2020   Chronic upper extremity pain (Right) 09/13/2020   Cervical spondylosis with radiculopathy (Right) 09/13/2020   Cervical radiculopathy at C7 (Right) 09/13/2020   Atypical chest pain 07/13/2020   Diverticular disease 07/13/2020   Shortness of breath 07/13/2020   Palpitations 07/13/2020   GERD (gastroesophageal reflux disease) 07/13/2020   Insomnia 07/11/2020   Breast cancer (HCC) 06/22/2020   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) 06/22/2020   DDD (degenerative disc disease), cervical 06/22/2020   DDD (degenerative disc disease), thoracic 06/22/2020   DDD (degenerative disc  disease), lumbar 06/22/2020   Displacement of thoracic intervertebral disc (C6-T3, T4-5, T6-7, T8-9) 06/22/2020   Non-traumatic compression fracture of T8  thoracic vertebra, sequela 06/22/2020   Spondylosis without myelopathy or radiculopathy, thoracic region 05/11/2020   Closed wedge compression fracture of T8 vertebra (HCC) 05/11/2020   Osteoarthritis of hips (Bilateral) 05/11/2020   Abnormal thoracolumbar CT myelogram (01/05/2020) 05/11/2020   Chronic pain syndrome 05/08/2020   Colon cancer screening 05/08/2020   Disorder of skeletal system 05/08/2020   Problems influencing health status 05/08/2020   Chronic lower extremity pain (3ry area of Pain) (Right) 05/08/2020   Chronic knee pain (4th area of Pain) (Posterior) (Left) 05/08/2020   Thoracic facet syndrome (Bilateral) 05/08/2020   Chronic sacroiliac joint pain (Bilateral) 05/08/2020   Osteoporosis 01/14/2020   Chronic low back pain with sciatica 01/14/2020   Chronic thoracic back pain (1ry area of Pain) (Bilateral) (R>L) 01/14/2020   Fusion of spine of lumbosacral region 12/13/2019   Lumbosacral spondylosis with radiculopathy 12/13/2019   S/P LASIK (laser assisted in situ keratomileusis) of both eyes 11/01/2019   Aortic atherosclerosis (HCC) 09/17/2019   Chronic hip pain (2ry area of Pain) (Bilateral) (R>L) 09/13/2019   Age-related nuclear cataract of right eye 08/19/2019   Age-related nuclear cataract of left eye 08/19/2019   Recurrent incisional hernia with incarceration & SBO s/p reduction 05/28/2018   Arthritis 01/07/2018   Chronic UTI 12/31/2017   IBS (irritable bowel syndrome) 12/29/2017   History of breast cancer 12/29/2017   Baker's cyst of knee (Left) 12/29/2017   Scoliosis of cervical region due to degenerative disease of spine in adult 09/18/2017   Hypothyroidism 02/15/2013   Hyperlipidemia 02/15/2013   Elevated blood pressure reading with diagnosis of hypertension 02/15/2013   Essential hypertension 02/15/2013    -Will be glad to see this pleasant lady back as needed.   Campbell Lerner M.D., FACS 06/03/2023, 9:59 AM

## 2023-06-09 ENCOUNTER — Ambulatory Visit: Payer: Medicare Other | Admitting: Family Medicine

## 2023-06-11 DIAGNOSIS — M1711 Unilateral primary osteoarthritis, right knee: Secondary | ICD-10-CM | POA: Diagnosis not present

## 2023-06-11 DIAGNOSIS — M1611 Unilateral primary osteoarthritis, right hip: Secondary | ICD-10-CM | POA: Diagnosis not present

## 2023-06-12 ENCOUNTER — Ambulatory Visit (INDEPENDENT_AMBULATORY_CARE_PROVIDER_SITE_OTHER): Payer: Medicare Other | Admitting: Neurosurgery

## 2023-06-12 ENCOUNTER — Encounter: Payer: Self-pay | Admitting: Neurosurgery

## 2023-06-12 VITALS — BP 161/87 | HR 82 | Temp 98.5°F | Wt 132.0 lb

## 2023-06-12 DIAGNOSIS — T84498D Other mechanical complication of other internal orthopedic devices, implants and grafts, subsequent encounter: Secondary | ICD-10-CM

## 2023-06-12 DIAGNOSIS — Z09 Encounter for follow-up examination after completed treatment for conditions other than malignant neoplasm: Secondary | ICD-10-CM | POA: Diagnosis not present

## 2023-06-12 DIAGNOSIS — M542 Cervicalgia: Secondary | ICD-10-CM

## 2023-06-12 NOTE — Progress Notes (Signed)
   REFERRING PHYSICIAN:  No referring provider defined for this encounter.  DOS: 03/12/23 removal of iliac screws   HISTORY OF PRESENT ILLNESS: Maria Spencer is status post iliac screw removal.  She is doing much better.  She is happy with her improvements.    She previously saw Dr. Laban Emperor for neck injections and would like to see him again.   PHYSICAL EXAMINATION:  General: Patient is well developed, well nourished, calm, collected, and in no apparent distress.   NEUROLOGICAL:  General: In no acute distress.   Awake, alert, oriented to person, place, and time.  Pupils equal round and reactive to light.  Facial tone is symmetric.    Strength:            Side Iliopsoas Quads Hamstring PF DF EHL  R 5 5 5 5 5 5   L 5 5 5 5 5 5    Incision c/d/i well-healed   ROS (Neurologic):  Negative except as noted above  IMAGING: No interval imaging to review today  ASSESSMENT/PLAN:  Maria Spencer is doing much better.  She still having some back and leg pain, but she is much better than she was prior to surgery.  She is happy with her improvements.  I will refer her back to Dr. Laban Emperor for consideration of neck injections.  I will see her back on an as-needed basis.  Venetia Night MD Department of neurosurgery

## 2023-06-16 ENCOUNTER — Ambulatory Visit (INDEPENDENT_AMBULATORY_CARE_PROVIDER_SITE_OTHER): Payer: Medicare Other | Admitting: *Deleted

## 2023-06-16 VITALS — Ht 59.0 in | Wt 129.0 lb

## 2023-06-16 DIAGNOSIS — Z0000 Encounter for general adult medical examination without abnormal findings: Secondary | ICD-10-CM | POA: Diagnosis not present

## 2023-06-16 NOTE — Progress Notes (Signed)
Subjective:   Maria Spencer is a 77 y.o. female who presents for Medicare Annual (Subsequent) preventive examination.  Visit Complete: Virtual  I connected with  Lum Keas on 06/16/23 by a audio enabled telemedicine application and verified that I am speaking with the correct person using two identifiers.  Patient Location: Home  Provider Location: Office/Clinic  I discussed the limitations of evaluation and management by telemedicine. The patient expressed understanding and agreed to proceed.  Review of Systems     Cardiac Risk Factors include: advanced age (>58men, >17 women);dyslipidemia;hypertension     Objective:    Today's Vitals   06/16/23 1031 06/16/23 1032  Weight: 129 lb (58.5 kg)   Height: 4\' 11"  (1.499 m)   PainSc:  2    Body mass index is 26.05 kg/m.     06/16/2023   10:48 AM 04/16/2023    6:30 AM 04/08/2023   11:32 AM 03/12/2023    6:22 AM 03/06/2023   10:50 AM 03/18/2022    2:31 PM 03/11/2022    8:55 AM  Advanced Directives  Does Patient Have a Medical Advance Directive? Yes Yes Yes Yes Yes Yes Yes  Type of Estate agent of [place];Living will Healthcare Power of [place];Living will  Healthcare Power of [place];Living will  Healthcare Power of [place];Living will Healthcare Power of [place];Living will  Does patient want to make changes to medical advance directive? No - Patient declined No - Patient declined    No - Patient declined   Copy of Healthcare Power of Attorney in Chart? Yes - validated most recent copy scanned in chart (See row information) No - copy requested         Current Medications (verified) Outpatient Encounter Medications as of 06/16/2023  Medication Sig   atorvastatin (LIPITOR) 20 MG tablet Take 1 tablet (20 mg total) by mouth daily.   b complex vitamins capsule Take 1 capsule by mouth daily.   Calcium Carb-Cholecalciferol ([employer] CALCIUM 600 & VITAMIN D3 PO) Take 1 tablet by mouth daily.   cholecalciferol  (VITAMIN D) 25 MCG (1000 UNIT) tablet Take 1,000 Units by mouth daily.   D-MANNOSE PO Take 1,300 mg by mouth daily.   levothyroxine (SYNTHROID) 88 MCG tablet Take 1 tablet (88 mcg total) by mouth daily before breakfast. 30 minutes   Melatonin 10 MG TABS Take 20 mg by mouth daily.   metoprolol succinate (TOPROL-XL) 50 MG 24 hr tablet Take 1 tablet (50 mg total) by mouth daily. Take with or immediately following a meal.   Multiple Vitamin (MULTI-VITAMINS) TABS Take 1 tablet by mouth daily.   telmisartan (MICARDIS) 80 MG tablet Take 1 tablet (80 mg total) by mouth daily.   tiZANidine (ZANAFLEX) 4 MG tablet Take 0.5-1 tablets (2-4 mg total) by mouth every 8 (eight) hours as needed for muscle spasms.   traMADol (ULTRAM) 50 MG tablet Take 50 mg by mouth every 12 (twelve) hours as needed.   No facility-administered encounter medications on file as of 06/16/2023.    Allergies (verified) Sulfa antibiotics, Nitrofuran derivatives, and Amlodipine besylate   History: Past Medical History:  Diagnosis Date   Arthritis    neck, back, left knee;    Breast cancer (HCC) 2009   right lumpectomy s/p radiation x 1 week 2x per day only    Chicken pox    Chronic UTI    established with urology    COVID-19    ? 12/2020 sob  took antiviral, 08/15/21   Diverticular disease    -  osis and -itis    Diverticulitis    Esophagitis    egd 05/17/14 see report scanned into chart   GERD (gastroesophageal reflux disease)    h/o   History of kidney stones    Hormone disorder    Hyperlipidemia    Hypertension    controlled well with medication;    Hypothyroidism    LVH (left ventricular hypertrophy)    mild   MR (mitral regurgitation)    Osteoporosis    PAC (premature atrial contraction)    Personal history of radiation therapy 2009   F/U right breast cancer   PSVT (paroxysmal supraventricular tachycardia)    PVC (premature ventricular contraction)    Sepsis (HCC)    Thyroid disease    hypothyroidism     Past Surgical History:  Procedure Laterality Date   BONE GRAFT HIP ILIAC CREST  2018   + cage left hip 10/23/17    BREAST BIOPSY Left 2009   clip,benign   BREAST BIOPSY Right 2009   +   BREAST LUMPECTOMY Right 2009   2009 lumpectomy    CARDIAC CATHETERIZATION  2001   No stents;  results were WNL Dr. requested it for HTN   CATARACT EXTRACTION, BILATERAL Bilateral 10/2019   COLON SURGERY  03/2023   COLONOSCOPY WITH PROPOFOL N/A 05/15/2020   Procedure: COLONOSCOPY WITH PROPOFOL;  Surgeon: Toney Reil, MD;  Location: [facility] ENDOSCOPY;  Service: Gastroenterology;  Laterality: N/A;   HARDWARE REMOVAL N/A 03/12/2023   Procedure: REMOVAL OF ILIAC SCREWS;  Surgeon: Venetia Night, MD;  Location: [facility] ORS;  Service: Neurosurgery;  Laterality: N/A;   INSERTION OF MESH N/A 05/29/2018   Procedure: INSERTION OF MESH;  Surgeon: Luretha Murphy, MD;  Location: [facility] ORS;  Service: General;  Laterality: N/A;   QUADRICEPS TENDON REPAIR Right 12/05/2020   Procedure: OPEN REPAIR OF RIGHT GLUTEUS MINIMUS TENDON;  Surgeon: Christena Flake, MD;  Location: [facility] ORS;  Service: Orthopedics;  Laterality: Right;   right gluteus medius/minimus repair Dr. Joice Lofts 11/2020      Dr. Joice Lofts   SHOULDER SURGERY Left 2008, 2009   x2 rotator cuff surgeries left shoulders   SPINAL FUSION  07/24/2020   SPINE SURGERY  10/23/2017   spinal 09/2017 h/o scoliosis [facility] L4-S1 OLIF T10 to ililum fusion   TONSILLECTOMY     age 59 y.o.    TOTAL KNEE ARTHROPLASTY Left 03/18/2022   Procedure: TOTAL KNEE ARTHROPLASTY;  Surgeon: Joen Laura, MD;  Location: [facility] ORS;  Service: Orthopedics;  Laterality: Left;   TOTAL SHOULDER REPLACEMENT Right 2012   TUBAL LIGATION     age 72   VENTRAL HERNIA REPAIR N/A 05/29/2018   Procedure: LAPAROSCOPIC VENTRAL / LATERAL HERNIA REPAIR;  Surgeon: Luretha Murphy, MD;  Location: [facility] ORS;  Service: General;  Laterality: N/A;   Family History  Problem Relation Age of Onset   Arthritis  Mother    Heart disease Mother 69       stent    Hyperlipidemia Mother    Hypertension Mother    Coronary artery disease Mother 22   Arthritis Father    Diabetes Father    Cancer Father        colon   AAA (abdominal aortic aneurysm) Father    Arthritis Sister    Hyperlipidemia Sister    Hypertension Sister    Cancer Brother        lung, smoker   Mental retardation Brother    Drug abuse Daughter  overdose in Jul 24, 2017    Arthritis Sister    Hyperlipidemia Sister    Bladder Cancer Neg Hx    Kidney cancer Neg Hx    Breast cancer Neg Hx    Social History   Socioeconomic History   Marital status: Widowed    Spouse name: Not on file   Number of children: 2   Years of education: Not on file   Highest education level: Bachelor's degree (e.g., BA, AB, BS)  Occupational History   Not on file  Tobacco Use   Smoking status: Never    Passive exposure: Never   Smokeless tobacco: Never  Vaping Use   Vaping status: Never Used  Substance and Sexual Activity   Alcohol use: Not Currently    Comment: rare   Drug use: No   Sexual activity: Yes    Birth control/protection: Post-menopausal  Other Topics Concern   Not on file  Social History Narrative   College grad   widowed husband died Jul 25, 2019    Wears seatbelt and safe in relationship    3 kids : 1 daughter passed      [employer]   Social Determinants of Health   Financial Resource Strain: Low Risk  (06/16/2023)   Overall Financial Resource Strain (CARDIA)    Difficulty of Paying Living Expenses: Not hard at all  Food Insecurity: No Food Insecurity (06/16/2023)   Hunger Vital Sign    Worried About Running Out of Food in the Last Year: Never true    Ran Out of Food in the Last Year: Never true  Transportation Needs: No Transportation Needs (06/16/2023)   PRAPARE - Administrator, Civil Service (Medical): No    Lack of Transportation (Non-Medical): No  Physical Activity: Inactive (06/16/2023)    Exercise Vital Sign    Days of Exercise per Week: 0 days    Minutes of Exercise per Session: 0 min  Stress: Stress Concern Present (06/16/2023)   [employer] of Occupational Health - Occupational Stress Questionnaire    Feeling of Stress : Rather much  Social Connections: Moderately Integrated (06/16/2023)   Social Connection and Isolation Panel [NHANES]    Frequency of Communication with Friends and Family: More than three times a week    Frequency of Social Gatherings with Friends and Family: Once a week    Attends Religious Services: More than 4 times per year    Active Member of [employer] or Organizations: Yes    Attends Engineer, structural: More than 4 times per year    Marital Status: Divorced    Tobacco Counseling Counseling given: Not Answered   Clinical Intake:  Pre-visit preparation completed: Yes  Pain : 0-10 Pain Score: 2  Pain Location: Hip Pain Orientation: Right Pain Descriptors / Indicators: Radiating, Aching Pain Onset: More than a month ago Pain Frequency: Constant Pain Relieving Factors: medications  Pain Relieving Factors: medications  BMI - recorded: 26.05 Nutritional Status: BMI 25 -29 Overweight Nutritional Risks: None Diabetes: No  How often do you need to have someone help you when you read instructions, pamphlets, or other written materials from your doctor or pharmacy?: 1 - Never  Interpreter Needed?: No  Information entered by :: R. Maze Corniel LPN   Activities of Daily Living    06/16/2023   10:35 AM 06/12/2023    8:49 AM  In your present state of health, do you have any difficulty performing the following activities:  Hearing? 0 0  Vision? 0  0  Comment readers   Difficulty concentrating or making decisions? 0 0  Walking or climbing stairs? 1 1  Comment beause of hip   Dressing or bathing? 0 0  Doing errands, shopping? 0 0  Preparing Food and eating ? N N  Using the Toilet? N N  In the past six months, have you accidently  leaked urine? Y Y  Comment after colon surgery   Do you have problems with loss of bowel control? N N  Managing your Medications? N N  Managing your Finances? N N  Housekeeping or managing your Housekeeping? N N    Patient Care Team: Dana Allan, MD as PCP - General (Family Medicine) End, Cristal Deer, MD as PCP - Cardiology (Cardiology) Luretha Murphy, MD as Consulting Physician (General Surgery) Tula Nakayama, NP as Nurse Practitioner (Neurosurgery) [facility], Delorise Royals, MD as Consulting Physician (Neurosurgery) Desmond Dike, MD as Consulting Physician (Urology)  Indicate any recent Medical Services you may have received from other than [facility] providers in the past year (date may be approximate).     Assessment:   This is a routine wellness examination for Haely.  Hearing/Vision screen No results found.  Dietary issues and exercise activities discussed:     Goals Addressed             This Visit's Progress    Patient Stated       Wants to get her hip fixed so that she can start back exercising       Depression Screen    06/16/2023   10:40 AM 05/01/2023    3:40 PM 02/05/2023    1:22 PM 12/17/2022   11:40 AM 05/23/2022   10:10 AM 10/11/2021    2:53 PM 10/09/2021    9:46 AM  PHQ 2/9 Scores  PHQ - 2 Score 0 2 0 1 1 0   PHQ- 9 Score 4 8  8      Exception Documentation       --    Fall Risk    06/16/2023   10:51 AM 06/12/2023    8:49 AM 05/01/2023    3:40 PM 02/05/2023    1:22 PM 02/05/2023    1:05 PM  Fall Risk   Falls in the past year? 0 0 0 0 0  Number falls in past yr: 0  0    Injury with Fall? 0 0 0    Risk for fall due to : No Fall Risks      Follow up Falls prevention discussed;Falls evaluation completed        MEDICARE RISK AT HOME:  Medicare Risk at Home - 06/16/23 1052     Any stairs in or around the home? Yes    If so, are there any without handrails? Yes    Home free of loose throw rugs in walkways, pet beds, electrical cords, etc? Yes     Adequate lighting in your home to reduce risk of falls? Yes    Life alert? No    Use of a cane, walker or w/c? Yes   cane   Grab bars in the bathroom? Yes    Shower chair or bench in shower? Yes    Elevated toilet seat or a handicapped toilet? Yes              Cognitive Function:        06/16/2023   10:49 AM 09/08/2018    1:59 PM  6CIT Screen  What Year? 0 points 0  points  What month? 0 points 0 points  What time? 0 points 0 points  Count back from 20 0 points 0 points  Months in reverse 0 points 0 points  Repeat phrase 0 points 0 points  Total Score 0 points 0 points    Immunizations Immunization History  Administered Date(s) Administered   Influenza Split 11/25/2005, 08/25/2012   Influenza, High Dose Seasonal PF 08/13/2016, 08/02/2017, 08/07/2018, 08/09/2019, 08/29/2020, 09/10/2021, 08/22/2022   Influenza,inj,Quad PF,6+ Mos 08/25/2014, 08/26/2015   Influenza-Unspecified 08/25/2017   PFIZER Comirnaty(Gray Top)Covid-19 Tri-Sucrose Vaccine 12/24/2019, 01/07/2020, 08/22/2022   PFIZER(Purple Top)SARS-COV-2 Vaccination 12/24/2019, 01/07/2020, 08/29/2020   PNEUMOCOCCAL CONJUGATE-20 05/01/2023   Pfizer Covid-19 Vaccine Bivalent Booster 50yrs & up 09/10/2021   Pneumococcal Conjugate-13 01/30/2015, 12/29/2017   Pneumococcal Polysaccharide-23 11/26/2011, 12/09/2011, 10/10/2018   Tdap 12/09/2011, 06/25/2022   Zoster Recombinant(Shingrix) 07/08/2022   Zoster, Live 11/26/2010   Zoster, Unspecified 12/26/2022    TDAP status: Up to date  Flu Vaccine status: Up to date  Pneumococcal vaccine status: Up to date  Covid-19 vaccine status: Completed vaccines  Qualifies for Shingles Vaccine? Yes   Zostavax completed Yes   Shingrix Completed?: Yes  Screening Tests Health Maintenance  Topic Date Due   COVID-19 Vaccine (8 - 2023-24 season) 10/17/2022   INFLUENZA VACCINE  06/26/2023   Medicare Annual Wellness (AWV)  06/15/2024   Colonoscopy  05/16/2027   DTaP/Tdap/Td (3 -  Td or Tdap) 06/25/2032   Pneumonia Vaccine 56+ Years old  Completed   DEXA SCAN  Completed   Hepatitis C Screening  Completed   Zoster Vaccines- Shingrix  Completed   HPV VACCINES  Aged Out    Health Maintenance  Health Maintenance Due  Topic Date Due   COVID-19 Vaccine (8 - 2023-24 season) 10/17/2022    Colonoscopy 5/24, no longer needed because of age  Mammogram status: Completed 7/23. Repeat every year Scheduled 06/17/23   Bone Density status: Completed 10/21. Results reflect: Bone density results: OSTEOPOROSIS. Repeat every 2 years. Sees specialist at [facility]   Lung Cancer Screening: (Low Dose CT Chest recommended if Age 77-80 years, 20 pack-year currently smoking OR have quit w/in 15years.) does not qualify.     Additional Screening:  Hepatitis C Screening: does qualify; Completed 2/19  Vision Screening: Recommended annual ophthalmology exams for early detection of glaucoma and other disorders of the eye. Is the patient up to date with their annual eye exam?  Yes  Who is the provider or what is the name of the office in which the patient attends annual eye exams? [facility] If pt is not established with a provider, would they like to be referred to a provider to establish care? No .   Dental Screening: Recommended annual dental exams for proper oral hygiene    Community Resource Referral / Chronic Care Management: CRR required this visit?  No   CCM required this visit?  No     Plan:     I have personally reviewed and noted the following in the patient's chart:   Medical and social history Use of alcohol, tobacco or illicit drugs  Current medications and supplements including opioid prescriptions. Patient is not currently taking opioid prescriptions. Functional ability and status Nutritional status Physical activity Advanced directives List of other physicians Hospitalizations, surgeries, and ER visits in previous 12 months Vitals Screenings  to include cognitive, depression, and falls Referrals and appointments  In addition, I have reviewed and discussed with patient certain preventive protocols, quality metrics, and best practice  recommendations. A written personalized care plan for preventive services as well as general preventive health recommendations were provided to patient.     Sydell Axon, LPN   1/61/0960   After Visit Summary: (MyChart) Due to this being a telephonic visit, the after visit summary with patients personalized plan was offered to patient via MyChart   Nurse Notes: None

## 2023-06-16 NOTE — Patient Instructions (Signed)
Per patient no change in vitals since last visit, unable to obtain new vitals due to telehealth visit  Maria Spencer , Thank you for taking time to come for your Medicare Wellness Visit. I appreciate your ongoing commitment to your health goals. Please review the following plan we discussed and let me know if I can assist you in the future.   These are the goals we discussed:  Goals       Maintain Healthy Lifestyle (pt-stated)      I want to walk for exercise not using a cane Eat a healthy diet       Patient Stated      Wants to get her hip fixed so that she can start back exercising        This is a list of the screening recommended for you and due dates:  Health Maintenance  Topic Date Due   COVID-19 Vaccine (8 - 2023-24 season) 10/17/2022   Flu Shot  06/26/2023   Medicare Annual Wellness Visit  06/15/2024   Colon Cancer Screening  05/16/2027   DTaP/Tdap/Td vaccine (3 - Td or Tdap) 06/25/2032   Pneumonia Vaccine  Completed   DEXA scan (bone density measurement)  Completed   Hepatitis C Screening  Completed   Zoster (Shingles) Vaccine  Completed   HPV Vaccine  Aged Out    Advanced directives:On file  Conditions/risks identified: None  Next appointment: Follow up in one year for your annual wellness visit 06/16/24 @ 11:15   Preventive Care 65 Years and Older, Female Preventive care refers to lifestyle choices and visits with your health care provider that can promote health and wellness. What does preventive care include? A yearly physical exam. This is also called an annual well check. Dental exams once or twice a year. Routine eye exams. Ask your health care provider how often you should have your eyes checked. Personal lifestyle choices, including: Daily care of your teeth and gums. Regular physical activity. Eating a healthy diet. Avoiding tobacco and drug use. Limiting alcohol use. Practicing safe sex. Taking low-dose aspirin every day. Taking vitamin and mineral  supplements as recommended by your health care provider. What happens during an annual well check? The services and screenings done by your health care provider during your annual well check will depend on your age, overall health, lifestyle risk factors, and family history of disease. Counseling  Your health care provider may ask you questions about your: Alcohol use. Tobacco use. Drug use. Emotional well-being. Home and relationship well-being. Sexual activity. Eating habits. History of falls. Memory and ability to understand (cognition). Work and work Astronomer. Reproductive health. Screening  You may have the following tests or measurements: Height, weight, and BMI. Blood pressure. Lipid and cholesterol levels. These may be checked every 5 years, or more frequently if you are over 41 years old. Skin check. Lung cancer screening. You may have this screening every year starting at age 80 if you have a 30-pack-year history of smoking and currently smoke or have quit within the past 15 years. Fecal occult blood test (FOBT) of the stool. You may have this test every year starting at age 86. Flexible sigmoidoscopy or colonoscopy. You may have a sigmoidoscopy every 5 years or a colonoscopy every 10 years starting at age 80. Hepatitis C blood test. Hepatitis B blood test. Sexually transmitted disease (STD) testing. Diabetes screening. This is done by checking your blood sugar (glucose) after you have not eaten for a while (fasting). You may have  this done every 1-3 years. Bone density scan. This is done to screen for osteoporosis. You may have this done starting at age 22. Mammogram. This may be done every 1-2 years. Talk to your health care provider about how often you should have regular mammograms. Talk with your health care provider about your test results, treatment options, and if necessary, the need for more tests. Vaccines  Your health care provider may recommend certain  vaccines, such as: Influenza vaccine. This is recommended every year. Tetanus, diphtheria, and acellular pertussis (Tdap, Td) vaccine. You may need a Td booster every 10 years. Zoster vaccine. You may need this after age 56. Pneumococcal 13-valent conjugate (PCV13) vaccine. One dose is recommended after age 39. Pneumococcal polysaccharide (PPSV23) vaccine. One dose is recommended after age 98. Talk to your health care provider about which screenings and vaccines you need and how often you need them. This information is not intended to replace advice given to you by your health care provider. Make sure you discuss any questions you have with your health care provider. Document Released: 12/08/2015 Document Revised: 07/31/2016 Document Reviewed: 09/12/2015 Elsevier Interactive Patient Education  2017 [employer].  Fall Prevention in the Home Falls can cause injuries. They can happen to people of all ages. There are many things you can do to make your home safe and to help prevent falls. What can I do on the outside of my home? Regularly fix the edges of walkways and driveways and fix any cracks. Remove anything that might make you trip as you walk through a door, such as a raised step or threshold. Trim any bushes or trees on the path to your home. Use bright outdoor lighting. Clear any walking paths of anything that might make someone trip, such as rocks or tools. Regularly check to see if handrails are loose or broken. Make sure that both sides of any steps have handrails. Any raised decks and porches should have guardrails on the edges. Have any leaves, snow, or ice cleared regularly. Use sand or salt on walking paths during winter. Clean up any spills in your garage right away. This includes oil or grease spills. What can I do in the bathroom? Use night lights. Install grab bars by the toilet and in the tub and shower. Do not use towel bars as grab bars. Use non-skid mats or decals in  the tub or shower. If you need to sit down in the shower, use a plastic, non-slip stool. Keep the floor dry. Clean up any water that spills on the floor as soon as it happens. Remove soap buildup in the tub or shower regularly. Attach bath mats securely with double-sided non-slip rug tape. Do not have throw rugs and other things on the floor that can make you trip. What can I do in the bedroom? Use night lights. Make sure that you have a light by your bed that is easy to reach. Do not use any sheets or blankets that are too big for your bed. They should not hang down onto the floor. Have a firm chair that has side arms. You can use this for support while you get dressed. Do not have throw rugs and other things on the floor that can make you trip. What can I do in the kitchen? Clean up any spills right away. Avoid walking on wet floors. Keep items that you use a lot in easy-to-reach places. If you need to reach something above you, use a strong step stool  that has a grab bar. Keep electrical cords out of the way. Do not use floor polish or wax that makes floors slippery. If you must use wax, use non-skid floor wax. Do not have throw rugs and other things on the floor that can make you trip. What can I do with my stairs? Do not leave any items on the stairs. Make sure that there are handrails on both sides of the stairs and use them. Fix handrails that are broken or loose. Make sure that handrails are as long as the stairways. Check any carpeting to make sure that it is firmly attached to the stairs. Fix any carpet that is loose or worn. Avoid having throw rugs at the top or bottom of the stairs. If you do have throw rugs, attach them to the floor with carpet tape. Make sure that you have a light switch at the top of the stairs and the bottom of the stairs. If you do not have them, ask someone to add them for you. What else can I do to help prevent falls? Wear shoes that: Do not have high  heels. Have rubber bottoms. Are comfortable and fit you well. Are closed at the toe. Do not wear sandals. If you use a stepladder: Make sure that it is fully opened. Do not climb a closed stepladder. Make sure that both sides of the stepladder are locked into place. Ask someone to hold it for you, if possible. Clearly mark and make sure that you can see: Any grab bars or handrails. First and last steps. Where the edge of each step is. Use tools that help you move around (mobility aids) if they are needed. These include: Canes. Walkers. Scooters. Crutches. Turn on the lights when you go into a dark area. Replace any light bulbs as soon as they burn out. Set up your furniture so you have a clear path. Avoid moving your furniture around. If any of your floors are uneven, fix them. If there are any pets around you, be aware of where they are. Review your medicines with your doctor. Some medicines can make you feel dizzy. This can increase your chance of falling. Ask your doctor what other things that you can do to help prevent falls. This information is not intended to replace advice given to you by your health care provider. Make sure you discuss any questions you have with your health care provider. Document Released: 09/07/2009 Document Revised: 04/18/2016 Document Reviewed: 12/16/2014 Elsevier Interactive Patient Education  2017 [employer].

## 2023-06-17 ENCOUNTER — Ambulatory Visit
Admission: RE | Admit: 2023-06-17 | Discharge: 2023-06-17 | Disposition: A | Discharge status: Home / Self Care | Payer: Medicare Other | Source: Ambulatory Visit | Attending: Family Medicine | Admitting: Family Medicine

## 2023-06-17 DIAGNOSIS — Z1231 Encounter for screening mammogram for malignant neoplasm of breast: Secondary | ICD-10-CM | POA: Diagnosis not present

## 2023-06-19 NOTE — Progress Notes (Signed)
PROVIDER NOTE: Information contained herein reflects review and annotations entered in association with encounter. Interpretation of such information and data should be left to medically-trained personnel. Information provided to patient can be located elsewhere in the medical record under "Patient Instructions". Document created using STT-dictation technology, any transcriptional errors that may result from process are unintentional.    Patient: Maria Spencer  Service Category: E/M  Provider: Oswaldo Done, MD  DOB: 1946/07/05  DOS: 06/23/2023  Referring Provider: Venetia Night, MD  MRN: [id number]  Specialty: Interventional Pain Management  PCP: Dana Allan, MD  Type: Established Patient  Setting: Ambulatory outpatient    Location: Office  Delivery: Face-to-face     HPI  [name], a 77 y.o. year old female, is here today because of her Cervicalgia [M54.2]. Ms. Amend primary complain today is Neck Pain (mid)  Pertinent problems: Ms. 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; Arthritis; Chronic hip pain (2ry area of Pain) (Bilateral) (R>L); Chronic low back pain with sciatica; Chronic thoracic back pain (1ry area of Pain) (Bilateral) (R>L); Fusion of spine of lumbosacral region; Lumbosacral spondylosis with radiculopathy; Chronic pain syndrome; Chronic lower extremity pain (3ry area of Pain) (Right); Chronic knee pain (4th area of Pain) (Posterior) (Left); Thoracic facet syndrome (Bilateral); Chronic sacroiliac joint pain (Bilateral); Spondylosis without myelopathy or radiculopathy, thoracic region; Closed wedge compression fracture of T8 vertebra (HCC); Osteoarthritis of hips (Bilateral); Abnormal thoracolumbar CT myelogram (01/05/2020); Breast cancer (HCC); 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); DDD (degenerative disc disease), cervical; DDD (degenerative disc disease),  thoracic; DDD (degenerative disc disease), lumbar; Displacement of thoracic intervertebral disc (C6-T3, T4-5, T6-7, T8-9); Non-traumatic compression fracture of T8 thoracic vertebra, sequela; Atypical chest pain; Cervicalgia; Chronic upper extremity pain (Right); Cervical spondylosis with radiculopathy (Right); Cervical radiculopathy at C7 (Right); Chronic shoulder pain (Bilateral) (R>L); Chronic shoulder pain after shoulder replacement (Right); Osteoarthritis of AC (acromioclavicular) joints (Bilateral); Chronic Acromioclavicular (AC) joint pain (Bilateral); Osteoarthritis of glenohumeral joint (Left); Tear of right gluteus minimus tendon; Abnormal MRI, cervical spine (08/06/2022); Cervical facet syndrome (Bilateral); Cervicogenic headache (Bilateral); Occipital neuralgia (greater occipital nerve) (Bilateral); Cervico-occipital neuralgia (Bilateral); Cervical facet hypertrophy (Multilevel) (Bilateral); Spondylosis without myelopathy or radiculopathy, cervical region; Scoliosis of thoracic spine, unspecified scoliosis type; 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 Pain Assessment: Severity of   is reported as a 5 /10. Location: Shoulder Mid/shoulders bilateral, states going down back from spinal fusion. Onset: More than a month ago. Quality: Aching, Sharp, Discomfort, Constant (feels like a spider crawling down shoulder on the right side). Timing: Constant. Modifying factor(s): "Nothing". Vitals:  height is 4\' 11"  (1.499 m) and weight is 129 lb (58.5 kg). Her temperature is 97.5 F (36.4 C) (abnormal). Her blood pressure is 136/70 and her pulse is 87. Her respiration is 16 and oxygen saturation is 99%.  BMI: Estimated body mass index is  26.05 kg/m as calculated from the following:   Height as of this encounter: 4\' 11"  (1.499 m).   Weight as of this encounter: 129 lb (58.5 kg). Last encounter: 02/05/2023. Last procedure: 03/25/2023.  Reason for encounter: evaluation of worsening, or previously known (established) problem.  The patient was last seen on 03/25/2023  at which time she had a right cervical facet (C3-C6) MBB #2.  She was scheduled to return for follow-up evaluation 2 weeks after the procedure, but she did not keep that appointment.  As it turns out, the reason why the patient was unable to keep the appointment is because she had to undergo some surgeries.  She describes having had some of the fusion screws from the lower back removed by Dr. Marcell Barlow and at this point she does indicate for this to be feeling better.  However, she also had to have a sigmoid colectomy secondary to diverticulitis which she describes to care by surprise.  Today she describes her worst pain to be that of the back of the neck and going down both shoulders in the distribution of the trapezius muscle, bilaterally.  Today she indicates that the injections that she had on 03/25/2023 did help her pain, unfortunately they did not last long.  She indicated having attained 100% relief of the pain for the duration of the local anesthetic which eventually went down to a 50% improvement that only lasted for approximately 2 weeks.  Today she describes having this pain in the back of the neck, the trapezius area, and she has also been experiencing bilateral occipital headaches with the right side being worse than the left.  She also describes a pressure sensation over her chest whenever she moves her back.  This is likely to be secondary to the thoracolumbar fusion which extends quite a bit into the thoracic region.  In addition, she also describes having a sensation of formication (spiders crawling over her neck) over the back of the neck on the right side.  She  describes this to happen usually at bedtime.  Today she is interested in seeing what is it that we can do for this pain.  In view of the fact that the patient did get some benefit from the diagnostic cervical facet blocks, but no long-term benefit, I believe that the neck step would be to move onto radiofrequency ablation on the right cervical facets.  She describes the right side to be worse and therefore that is why we would start on that side.  Assuming that she gets good benefit, then we will consider other radiofrequency of the left side.  The plan was shared with the patient who understood and accepted.  Today I did remind the patient that she must have realistic expectations since there is no one procedure that will provide her with complete relief of all of her areas of pain.  She indicated understanding and knowing this.  Post-procedure evaluation   Procedure: Cervical Facet Medial Branch Block(s) #2  Laterality: Right  Level: C3, C4, C5, and C6 Medial Branch Level(s). Injecting these levels blocks the C3-4, C4-5, and C5-6 cervical facet joints.  Imaging: Fluoroscopic guidance Anesthesia: Local anesthesia (1-2% Lidocaine) Anxiolysis: None                 Sedation: No Sedation                       DOS: 03/25/2023  Performed by: Oswaldo Done, MD  Purpose: Diagnostic/Therapeutic Indications: Cervicalgia (cervical spine axial pain) severe enough to impact quality of life or function. 1. Cervical facet syndrome (Bilateral)   2. Cervical facet hypertrophy (Multilevel) (Bilateral)   3. Cervical facet joint pain   4. Spondylosis without myelopathy or radiculopathy, cervical region   5. Cervicalgia   6. DDD (degenerative disc disease),  cervical    NAS-11 Pain score:   Pre-procedure: 6 /10   Post-procedure: 0-No pain/10      Effectiveness:  Initial hour after procedure: 100 %. Subsequent 4-6 hours post-procedure: 100 %. Analgesia past initial 6 hours: 50 % (2 weeks). Ongoing  improvement:  Analgesic: The patient initially attain 100% relief of the pain for the duration of the local anesthetic which then slowly went down to a 50% improvement that lasted for approximately 2 weeks before going back to baseline. Function: Somewhat improved while the pain was down. ROM: Somewhat improved while the pain was down.  Pharmacotherapy Assessment  Analgesic: No opioid analgesics prescribed by our practice. Highest recorded MME/day: 75 mg/day MME/day: 0 mg/day   Monitoring: [place] PMP: PDMP reviewed during this encounter.       Pharmacotherapy: No side-effects or adverse reactions reported. Compliance: No problems identified. Effectiveness: Clinically acceptable.  Newman Pies, RN  06/23/2023 10:06 AM  Sign when Signing Visit Safety precautions to be maintained throughout the outpatient stay will include: orient to surroundings, keep bed in low position, maintain call bell within reach at all times, provide assistance with transfer out of bed and ambulation.     No results found for: "CBDTHCR" No results found for: "D8THCCBX" No results found for: "D9THCCBX"  UDS:  No results found for: "SUMMARY"    ROS  Constitutional: Denies any fever or chills Gastrointestinal: No reported hemesis, hematochezia, vomiting, or acute GI distress Musculoskeletal: Denies any acute onset joint swelling, redness, loss of ROM, or weakness Neurological: No reported episodes of acute onset apraxia, aphasia, dysarthria, agnosia, amnesia, paralysis, loss of coordination, or loss of consciousness  Medication Review  Calcium Carb-Cholecalciferol, D-Mannose, Melatonin, Multi-Vitamins, atorvastatin, b complex vitamins, cholecalciferol, levothyroxine, metoprolol succinate, telmisartan, tiZANidine, and traMADol  History Review  Allergy: Ms. Cassetta is allergic to sulfa antibiotics, nitrofuran derivatives, and amlodipine besylate. Drug: Ms. Tess  reports no history of drug use. Alcohol:  reports  that she does not currently use alcohol. Tobacco:  reports that she has never smoked. She has never been exposed to tobacco smoke. She has never used smokeless tobacco. Social: Ms. Holdman  reports that she has never smoked. She has never been exposed to tobacco smoke. She has never used smokeless tobacco. She reports that she does not currently use alcohol. She reports that she does not use drugs. Medical:  has a past medical history of Arthritis, Breast cancer (HCC) (2009), Chicken pox, Chronic UTI, COVID-19, Diverticular disease, Diverticulitis, Esophagitis, GERD (gastroesophageal reflux disease), History of kidney stones, Hormone disorder, Hyperlipidemia, Hypertension, Hypothyroidism, LVH (left ventricular hypertrophy), MR (mitral regurgitation), Osteoporosis, PAC (premature atrial contraction), Personal history of radiation therapy (2009), PSVT (paroxysmal supraventricular tachycardia), PVC (premature ventricular contraction), Sepsis (HCC), and Thyroid disease. Surgical: Ms. Frankie  has a past surgical history that includes Total shoulder replacement (Right, 2012); Shoulder surgery (Left, 2008, 2009); Tonsillectomy; Bone graft hip iliac crest (2018); Spine surgery (10/23/2017); Ventral hernia repair (N/A, 05/29/2018); Insertion of mesh (N/A, 05/29/2018); Cardiac catheterization (2001); Breast lumpectomy (Right, 2009); Breast biopsy (Left, 2009); Breast biopsy (Right, 2009); Colonoscopy with propofol (N/A, 05/15/2020); Spinal fusion (07/24/2020); Cataract extraction, bilateral (Bilateral, 10/2019); Tubal ligation; Quadriceps tendon repair (Right, 12/05/2020); right gluteus medius/minimus repair Dr. Joice Lofts 11/2020 ; Total knee arthroplasty (Left, 03/18/2022); Hardware Removal (N/A, 03/12/2023); and Colon surgery (03/2023). Family: family history includes AAA (abdominal aortic aneurysm) in her father; Arthritis in her father, mother, sister, and sister; Cancer in her brother and father; Coronary artery disease  (age of onset:  80) in her mother; Diabetes in her father; Drug abuse in her daughter; Heart disease (age of onset: 2) in her mother; Hyperlipidemia in her mother, sister, and sister; Hypertension in her mother and sister; Mental retardation in her brother.  Laboratory Chemistry Profile   Renal Lab Results  Component Value Date   BUN 16 05/08/2023   CREATININE 0.50 05/08/2023   BCR 28 (H) 09/10/2019   GFR 91.09 05/08/2023   GFRAA >60 05/08/2020   GFRNONAA >60 04/20/2023    Hepatic Lab Results  Component Value Date   AST 16 05/08/2023   ALT 11 05/08/2023   ALBUMIN 4.0 05/08/2023   ALKPHOS 124 (H) 05/08/2023   LIPASE 26 03/15/2021    Electrolytes Lab Results  Component Value Date   NA 139 05/08/2023   K 3.8 05/08/2023   CL 103 05/08/2023   CALCIUM 9.1 05/08/2023   MG 2.0 05/08/2020    Bone Lab Results  Component Value Date   VD25OH 95.34 05/08/2023    Inflammation (CRP: Acute Phase) (ESR: Chronic Phase) Lab Results  Component Value Date   CRP 0.6 05/08/2020   ESRSEDRATE 5 05/08/2020   LATICACIDVEN 0.7 03/15/2021         Note: Above Lab results reviewed.  Recent Imaging Review  MM 3D SCREENING MAMMOGRAM BILATERAL BREAST CLINICAL DATA:  Screening.  EXAM: DIGITAL SCREENING BILATERAL MAMMOGRAM WITH TOMOSYNTHESIS AND CAD  TECHNIQUE: Bilateral screening digital craniocaudal and mediolateral oblique mammograms were obtained. Bilateral screening digital breast tomosynthesis was performed. The images were evaluated with computer-aided detection.  COMPARISON:  Previous exam(s).  ACR Breast Density Category c: The breasts are heterogeneously dense, which may obscure small masses.  FINDINGS: There are no findings suspicious for malignancy.  IMPRESSION: No mammographic evidence of malignancy. A result letter of this screening mammogram will be mailed directly to the patient.  RECOMMENDATION: Screening mammogram in one year. (Code:SM-B-01Y)  BI-RADS CATEGORY   1: Negative.  Electronically Signed   By: Edwin Cap M.D.   On: 06/18/2023 16:13 Note: Reviewed        Physical Exam  General appearance: Well nourished, well developed, and well hydrated. In no apparent acute distress Mental status: Alert, oriented x 3 (person, place, & time)       Respiratory: No evidence of acute respiratory distress Eyes: PERLA Vitals: BP 136/70   Pulse 87   Temp (!) 97.5 F (36.4 C)   Resp 16   Ht 4\' 11"  (1.499 m)   Wt 129 lb (58.5 kg)   SpO2 99%   BMI 26.05 kg/m  BMI: Estimated body mass index is 26.05 kg/m as calculated from the following:   Height as of this encounter: 4\' 11"  (1.499 m).   Weight as of this encounter: 129 lb (58.5 kg). Ideal: Patient must be at least 60 in tall to calculate ideal body weight  Assessment   Diagnosis Status  1. Cervicalgia   2. Cervicogenic headache (Bilateral)   3. Cervical facet joint pain   4. Cervical facet syndrome (Bilateral)   5. Chronic upper extremity pain (Right)   6. Postop check    Controlled Controlled Controlled   Updated Problems: No problems updated.  Plan of Care  Problem-specific:  No problem-specific Assessment & Plan notes found for this encounter.  Ms. Myosha Cuadras has a current medication list which includes the following long-term medication(s): atorvastatin, levothyroxine, metoprolol succinate, and telmisartan.  Pharmacotherapy (Medications Ordered): No orders of the defined types were placed in this encounter.  Orders:  Orders Placed This Encounter  Procedures   Radiofrequency,Cervical    Standing Status:   Future    Standing Expiration Date:   10/24/2023    Scheduling Instructions:     Side(s): Right-sided     Level(s): C3, C4, C5, C6, and C7 Medial Branch Nerves     Sedation: With Sedation.     Scheduling Timeframe: As soon as pre-approved    Order Specific Question:   Where will this procedure be performed?    Answer:   [facility]   Nursing  Instructions:    Please complete this patient's postprocedure evaluation.    Scheduling Instructions:     Please complete this patient's postprocedure evaluation.   Follow-up plan:   Return for (ECT): (R) C-FCT RFA #1.      Interventional Therapies  Risk Factors  Considerations:   Aortic atherosclerosis  osteoporosis  HTN  GERD  palpitations  prediabetes  SOB  Hx. of breast cancer   Planned  Pending:   Therapeutic right cervical FCT RFA #1   (02/05/2023) TENS ordered   Under consideration:   Diagnostic left inferolateral and recurrent genicular NB #1  Therapeutic left inferolateral and recurrent genicular nerve RFA #1  Diagnostic right T9-10 thoracic ESI #1  Diagnostic bilateral T9, T10, T11, and T12 MMB thoracic facet RFA (DIFICULT CANDIDATE DUE TO HARDWARE)    Completed:   Diagnostic left genicular NB x1 (07/03/2021) (100/100/50/50)  Therapeutic left genicular nerve RFA x1 (08/09/2021) (100/100/30/0)  Diagnostic left cervical facet MBB x1 (10/26/2020) (100/100/0)  Diagnostic right cervical facet MBB x2 (03/25/2023) (100/100/50 x 3 wks/ 0)   Diagnostic bilateral suprascapular NB x1 (10/05/2020) (100/100/100 x1 day/0)  Diagnostic right cervical ESI x1 (09/14/2020) (100/100/100/90-100)  Diagnostic/therapeutic bilateral T9-T12 thoracic facet MBB x2 (06/06/2020) (100/100/0)  Diagnostic/Therapeutic right trapezoid bursa injection x1 (11/09/2020) (did not follow-up)    Therapeutic  Palliative (PRN) options:   Therapeutic/palliative bilateral T9-12 thoracic facet MBB #3  Therapeutic/palliative right trapezoid bursa injection #2       Recent Visits Date Type Provider Dept  03/25/23 Procedure visit Delano Metz, MD [facility]  Showing recent visits within past 90 days and meeting all other requirements Today's Visits Date Type Provider Dept  06/23/23 Office Visit Delano Metz, MD [facility]  Showing today's visits and meeting all other  requirements Future Appointments No visits were found meeting these conditions. Showing future appointments within next 90 days and meeting all other requirements  I discussed the assessment and treatment plan with the patient. The patient was provided an opportunity to ask questions and all were answered. The patient agreed with the plan and demonstrated an understanding of the instructions.  Patient advised to call back or seek an in-person evaluation if the symptoms or condition worsens.  Duration of encounter: 30 minutes.  Total time on encounter, as per AMA guidelines included both the face-to-face and non-face-to-face time personally spent by the physician and/or other qualified health care professional(s) on the day of the encounter (includes time in activities that require the physician or other qualified health care professional and does not include time in activities normally performed by clinical staff). Physician's time may include the following activities when performed: Preparing to see the patient (e.g., pre-charting review of records, searching for previously ordered imaging, lab work, and nerve conduction tests) Review of prior analgesic pharmacotherapies. Reviewing PMP Interpreting ordered tests (e.g., lab work, imaging, nerve conduction tests) Performing post-procedure evaluations, including interpretation of diagnostic procedures Obtaining and/or reviewing separately  obtained history Performing a medically appropriate examination and/or evaluation Counseling and educating the patient/family/caregiver Ordering medications, tests, or procedures Referring and communicating with other health care professionals (when not separately reported) Documenting clinical information in the electronic or other health record Independently interpreting results (not separately reported) and communicating results to the patient/ family/caregiver Care coordination (not separately  reported)  Note by: Oswaldo Done, MD Date: 06/23/2023; Time: 11:03 AM

## 2023-06-23 ENCOUNTER — Ambulatory Visit: Payer: Medicare Other | Attending: Pain Medicine | Admitting: Pain Medicine

## 2023-06-23 ENCOUNTER — Encounter: Payer: Self-pay | Admitting: Pain Medicine

## 2023-06-23 VITALS — BP 136/70 | HR 87 | Temp 97.5°F | Resp 16 | Ht 59.0 in | Wt 129.0 lb

## 2023-06-23 DIAGNOSIS — M47812 Spondylosis without myelopathy or radiculopathy, cervical region: Secondary | ICD-10-CM | POA: Insufficient documentation

## 2023-06-23 DIAGNOSIS — M542 Cervicalgia: Secondary | ICD-10-CM | POA: Insufficient documentation

## 2023-06-23 DIAGNOSIS — Z09 Encounter for follow-up examination after completed treatment for conditions other than malignant neoplasm: Secondary | ICD-10-CM | POA: Insufficient documentation

## 2023-06-23 DIAGNOSIS — G8929 Other chronic pain: Secondary | ICD-10-CM | POA: Diagnosis not present

## 2023-06-23 DIAGNOSIS — M79601 Pain in right arm: Secondary | ICD-10-CM | POA: Insufficient documentation

## 2023-06-23 DIAGNOSIS — G4486 Cervicogenic headache: Secondary | ICD-10-CM | POA: Insufficient documentation

## 2023-06-23 NOTE — Patient Instructions (Addendum)
Radiofrequency Ablation Radiofrequency ablation is a procedure that is performed to relieve pain. The procedure is often used for back, neck, or arm pain. Radiofrequency ablation involves the use of a machine that creates radio waves to make heat. During the procedure, the heat is applied to the nerve that carries the pain signal. The heat damages the nerve and interferes with the pain signal. Pain relief usually starts about 2 weeks after the procedure and lasts for 6 months to 1 year. Tell a health care provider about: Any allergies you have. All medicines you are taking, including vitamins, herbs, eye drops, creams, and over-the-counter medicines. Any problems you or family members have had with anesthetic medicines. Any bleeding problems you have. Any surgeries you have had. Any medical conditions you have. Whether you are pregnant or may be pregnant. What are the risks? Generally, this is a safe procedure. However, problems may occur, including: Pain or soreness at the injection site. Allergic reaction to medicines given during the procedure. Bleeding. Infection at the injection site. Damage to nerves or blood vessels. What happens before the procedure? When to stop eating and drinking Follow instructions from your health care provider about what you may eat and drink before your procedure. These may include: 8 hours before the procedure Stop eating most foods. Do not eat meat, fried foods, or fatty foods. Eat only light foods, such as toast or crackers. All liquids are okay except energy drinks and alcohol. 6 hours before the procedure Stop eating. Drink only clear liquids, such as water, clear fruit juice, black coffee, plain tea, and sports drinks. Do not drink energy drinks or alcohol. 2 hours before the procedure Stop drinking all liquids. You may be allowed to take medicine with small sips of water. If you do not follow your health care provider's instructions, your  procedure may be delayed or canceled. Medicines Ask your health care provider about: Changing or stopping your regular medicines. This is especially important if you are taking diabetes medicines or blood thinners. Taking medicines such as aspirin and ibuprofen. These medicines can thin your blood. Do not take these medicines unless your health care provider tells you to take them. Taking over-the-counter medicines, vitamins, herbs, and supplements. General instructions Ask your health care provider what steps will be taken to help prevent infection. These steps may include: Removing hair at the procedure site. Washing skin with a germ-killing soap. Taking antibiotic medicine. If you will be going home right after the procedure, plan to have a responsible adult: Take you home from the hospital or clinic. You will not be allowed to drive. Care for you for the time you are told. What happens during the procedure?  You will be awake during the procedure. You will need to be able to talk with the health care provider during the procedure. An IV will be inserted into one of your veins. You will be given one or more of the following: A medicine to help you relax (sedative). A medicine to numb the area (local anesthetic). Your health care provider will insert a radiofrequency needle into the area to be treated. This is done with the help of fluoroscopy. A wire that carries the radio waves (electrode) will be put through the radiofrequency needle. An electrical pulse will be sent through the electrode to verify the correct nerve that is causing your pain. You will feel a tingling sensation, and you may have muscle twitching. The tissue around the needle tip will be heated by an  electric current that comes from the radiofrequency machine. This will numb the nerves. The needle will be removed. A bandage (dressing) will be put on the insertion area. The procedure may vary among health care providers  and hospitals. What happens after the procedure? Your blood pressure, heart rate, breathing rate, and blood oxygen level will be monitored until you leave the hospital or clinic. Return to your normal activities as told by your health care provider. Ask your health care provider what activities are safe for you. If you were given a sedative during the procedure, it can affect you for several hours. Do not drive or operate machinery until your health care provider says that it is safe. Summary Radiofrequency ablation is a procedure that is performed to relieve pain. The procedure is often used for back, neck, or arm pain. Radiofrequency ablation involves the use of a machine that creates radio waves to make heat. Plan to have a responsible adult take you home from the hospital or clinic. Do not drive or operate machinery until your health care provider says that it is safe. Return to your normal activities as told by your health care provider. Ask your health care provider what activities are safe for you. This information is not intended to replace advice given to you by your health care provider. Make sure you discuss any questions you have with your health care provider. Document Revised: 05/01/2021 Document Reviewed: 05/01/2021 [employer] Patient Education  2024 [employer]  ______________________________________________________________________  Procedure instructions  Do not eat or drink fluids (other than water) for 6 hours before your procedure  No water for 2 hours before your procedure  Take your blood pressure medicine with a sip of water  Arrive 30 minutes before your appointment  Carefully read the "Preparing for your procedure" detailed instructions  If you have questions call us at [phone]  _____________________________________________________________________    ______________________________________________________________________  Preparing for your  procedure  Appointments: If you think you may not be able to keep your appointment, call 24-48 hours in advance to cancel. We need time to make it available to others.  During your procedure appointment there will be: No Prescription Refills. No disability issues to discussed. No medication changes or discussions.  Instructions: Food intake: Avoid eating anything solid for at least 8 hours prior to your procedure. Clear liquid intake: You may take clear liquids such as water up to 2 hours prior to your procedure. (No carbonated drinks. No soda.) Transportation: Unless otherwise stated by your physician, bring a driver. Morning Medicines: Except for blood thinners, take all of your other morning medications with a sip of water. Make sure to take your heart and blood pressure medicines. If your blood pressure's lower number is above 100, the case will be rescheduled. Blood thinners: Make sure to stop your blood thinners as instructed.  If you take a blood thinner, but were not instructed to stop it, call our office [phone] and ask to talk to a nurse. Not stopping a blood thinner prior to certain procedures could lead to serious complications. Diabetics on insulin: Notify the staff so that you can be scheduled 1st case in the morning. If your diabetes requires high dose insulin, take only  of your normal insulin dose the morning of the procedure and notify the staff that you have done so. Preventing infections: Shower with an antibacterial soap the morning of your procedure.  Build-up your immune system: Take 1000 mg of Vitamin C with every meal (  3 times a day) the day prior to your procedure. Antibiotics: Inform the nursing staff if you are taking any antibiotics or if you have any conditions that may require antibiotics prior to procedures. (Example: recent joint implants)   Pregnancy: If you are pregnant make sure to notify the nursing staff. Not doing so may result in injury to the  fetus, including death.  Sickness: If you have a cold, fever, or any active infections, call and cancel or reschedule your procedure. Receiving steroids while having an infection may result in complications. Arrival: You must be in the facility at least 30 minutes prior to your scheduled procedure. Tardiness: Your scheduled time is also the cutoff time. If you do not arrive at least 15 minutes prior to your procedure, you will be rescheduled.  Children: Do not bring any children with you. Make arrangements to keep them home. Dress appropriately: There is always a possibility that your clothing may get soiled. Avoid long dresses. Valuables: Do not bring any jewelry or valuables.  Reasons to call and reschedule or cancel your procedure: (Following these recommendations will minimize the risk of a serious complication.) Surgeries: Avoid having procedures within 2 weeks of any surgery. (Avoid for 2 weeks before or after any surgery). Flu Shots: Avoid having procedures within 2 weeks of a flu shots or . (Avoid for 2 weeks before or after immunizations). Barium: Avoid having a procedure within 7-10 days after having had a radiological study involving the use of radiological contrast. (Myelograms, Barium swallow or enema study). Heart attacks: Avoid any elective procedures or surgeries for the initial 6 months after a "Myocardial Infarction" (Heart Attack). Blood thinners: It is imperative that you stop these medications before procedures. Let us know if you if you take any blood thinner.  Infection: Avoid procedures during or within two weeks of an infection (including chest colds or gastrointestinal problems). Symptoms associated with infections include: Localized redness, fever, chills, night sweats or profuse sweating, burning sensation when voiding, cough, congestion, stuffiness, runny nose, sore throat, diarrhea, nausea, vomiting, cold or Flu symptoms, recent or current infections. It is specially  important if the infection is over the area that we intend to treat. Heart and lung problems: Symptoms that may suggest an active cardiopulmonary problem include: cough, chest pain, breathing difficulties or shortness of breath, dizziness, ankle swelling, uncontrolled high or unusually low blood pressure, and/or palpitations. If you are experiencing any of these symptoms, cancel your procedure and contact your primary care physician for an evaluation.  Remember:  Regular Business hours are:  Monday to Thursday 8:00 AM to 4:00 PM  Provider's Schedule: Delano Metz, MD:  Procedure days: Tuesday and Thursday 7:30 AM to 4:00 PM  Edward Jolly, MD:  Procedure days: Monday and Wednesday 7:30 AM to 4:00 PM  ______________________________________________________________________    ____________________________________________________________________________________________  General Risks and Possible Complications  Patient Responsibilities: It is important that you read this as it is part of your informed consent. It is our duty to inform you of the risks and possible complications associated with treatments offered to you. It is your responsibility as a patient to read this and to ask questions about anything that is not clear or that you believe was not covered in this document.  Patient's Rights: You have the right to refuse treatment. You also have the right to change your mind, even after initially having agreed to have the treatment done. However, under this last option, if you wait until the last second to change  your mind, you may be charged for the materials used up to that point.  Introduction: Medicine is not an Visual merchandiser. Everything in Medicine, including the lack of treatment(s), carries the potential for danger, harm, or loss (which is by definition: Risk). In Medicine, a complication is a secondary problem, condition, or disease that can aggravate an already existing one. All  treatments carry the risk of possible complications. The fact that a side effects or complications occurs, does not imply that the treatment was conducted incorrectly. It must be clearly understood that these can happen even when everything is done following the highest safety standards.  No treatment: You can choose not to proceed with the proposed treatment alternative. The "PRO(s)" would include: avoiding the risk of complications associated with the therapy. The "CON(s)" would include: not getting any of the treatment benefits. These benefits fall under one of three categories: diagnostic; therapeutic; and/or palliative. Diagnostic benefits include: getting information which can ultimately lead to improvement of the disease or symptom(s). Therapeutic benefits are those associated with the successful treatment of the disease. Finally, palliative benefits are those related to the decrease of the primary symptoms, without necessarily curing the condition (example: decreasing the pain from a flare-up of a chronic condition, such as incurable terminal cancer).  General Risks and Complications: These are associated to most interventional treatments. They can occur alone, or in combination. They fall under one of the following six (6) categories: no benefit or worsening of symptoms; bleeding; infection; nerve damage; allergic reactions; and/or death. No benefits or worsening of symptoms: In Medicine there are no guarantees, only probabilities. No healthcare provider can ever guarantee that a medical treatment will work, they can only state the probability that it may. Furthermore, there is always the possibility that the condition may worsen, either directly, or indirectly, as a consequence of the treatment. Bleeding: This is more common if the patient is taking a blood thinner, either prescription or over the counter (example: Goody Powders, Fish oil, Aspirin, Garlic, etc.), or if suffering a condition  associated with impaired coagulation (example: Hemophilia, cirrhosis of the liver, low platelet counts, etc.). However, even if you do not have one on these, it can still happen. If you have any of these conditions, or take one of these drugs, make sure to notify your treating physician. Infection: This is more common in patients with a compromised immune system, either due to disease (example: diabetes, cancer, human immunodeficiency virus [HIV], etc.), or due to medications or treatments (example: therapies used to treat cancer and rheumatological diseases). However, even if you do not have one on these, it can still happen. If you have any of these conditions, or take one of these drugs, make sure to notify your treating physician. Nerve Damage: This is more common when the treatment is an invasive one, but it can also happen with the use of medications, such as those used in the treatment of cancer. The damage can occur to small secondary nerves, or to large primary ones, such as those in the spinal cord and brain. This damage may be temporary or permanent and it may lead to impairments that can range from temporary numbness to permanent paralysis and/or brain death. Allergic Reactions: Any time a substance or material comes in contact with our body, there is the possibility of an allergic reaction. These can range from a mild skin rash (contact dermatitis) to a severe systemic reaction (anaphylactic reaction), which can result in death. Death: In general, any medical  intervention can result in death, most of the time due to an unforeseen complication. ____________________________________________________________________________________________

## 2023-06-23 NOTE — Progress Notes (Signed)
Safety precautions to be maintained throughout the outpatient stay will include: orient to surroundings, keep bed in low position, maintain call bell within reach at all times, provide assistance with transfer out of bed and ambulation.  

## 2023-06-24 ENCOUNTER — Other Ambulatory Visit: Payer: Self-pay | Admitting: Pain Medicine

## 2023-06-24 DIAGNOSIS — M542 Cervicalgia: Secondary | ICD-10-CM

## 2023-06-24 DIAGNOSIS — M47812 Spondylosis without myelopathy or radiculopathy, cervical region: Secondary | ICD-10-CM

## 2023-06-24 DIAGNOSIS — R937 Abnormal findings on diagnostic imaging of other parts of musculoskeletal system: Secondary | ICD-10-CM

## 2023-07-02 DIAGNOSIS — M1611 Unilateral primary osteoarthritis, right hip: Secondary | ICD-10-CM | POA: Diagnosis not present

## 2023-07-09 ENCOUNTER — Telehealth: Payer: Self-pay | Admitting: Family Medicine

## 2023-07-09 NOTE — Telephone Encounter (Signed)
Pt called in stating that she has some problems inside her mouth, that she usually gets a few times around the year, and usually med Sluconazole helps. And she was wondering if Dr. Clent Ridges can sent a rx over to her pharmacy? We don't have appt left for the day today, however, I offered for tomorrow and she stated she has something also to do.

## 2023-07-14 NOTE — Telephone Encounter (Signed)
Called pt to inform her to make an appt with provider. Pt stated she got some meds from the pharmacy and it been helping her.

## 2023-07-14 NOTE — Telephone Encounter (Signed)
FYI

## 2023-07-16 DIAGNOSIS — M1711 Unilateral primary osteoarthritis, right knee: Secondary | ICD-10-CM | POA: Diagnosis not present

## 2023-07-16 DIAGNOSIS — M25551 Pain in right hip: Secondary | ICD-10-CM | POA: Diagnosis not present

## 2023-07-16 DIAGNOSIS — M48061 Spinal stenosis, lumbar region without neurogenic claudication: Secondary | ICD-10-CM | POA: Diagnosis not present

## 2023-07-16 DIAGNOSIS — R269 Unspecified abnormalities of gait and mobility: Secondary | ICD-10-CM | POA: Diagnosis not present

## 2023-07-27 ENCOUNTER — Other Ambulatory Visit: Payer: Self-pay | Admitting: Family Medicine

## 2023-07-31 NOTE — Progress Notes (Signed)
PROVIDER NOTE: Interpretation of information contained herein should be left to medically-trained personnel. Specific patient instructions are provided elsewhere under "Patient Instructions" section of medical record. This document was created in part using STT-dictation technology, any transcriptional errors that may result from this process are unintentional.  Patient: Maria Spencer Type: Established DOB: 07-29-1946 MRN: [id number] PCP: Dana Allan, MD  Service: Procedure DOS: 08/05/2023 Setting: Ambulatory Location: Ambulatory outpatient facility Delivery: Face-to-face Provider: Oswaldo Done, MD Specialty: Interventional Pain Management Specialty designation: 09 Location: Outpatient facility Ref. Prov.: Delano Metz, MD       Interventional Therapy   Procedure: Cervical Facet, Medial Branch Radiofrequency Ablation (RFA)  #1  Laterality: Right (-RT)  Level: C3, C4, C5, C6, and C7 Medial Branch Level(s). Lesioning of these levels should completely denervate the C3-4, C4-5, and C5-6 cervical facet joints.  Imaging: Fluoroscopy-guided Spinal ([id number]) Anesthesia: Local anesthesia (1-2% Lidocaine) Anxiolysis: IV Versed 2.0 mg Sedation: Moderate Sedation Fentanyl 0.5 mL (25 mcg) DOS: 08/05/2023  Performed by: Oswaldo Done, MD  Purpose: Therapeutic/Palliative Indications: Chronic neck pain (cervicalgia) severe enough to impact quality of life or function. Rationale (medical necessity): procedure needed and proper for the diagnosis and/or treatment of [name]'s medical symptoms and needs. Indications: 1. Cervicalgia   2. Cervical facet joint pain   3. Cervical facet hypertrophy (Multilevel) (Bilateral)   4. Cervical facet syndrome (Bilateral)   5. Spondylosis without myelopathy or radiculopathy, cervical region   6. Cervico-occipital neuralgia (Bilateral)   7. Cervicogenic headache (Bilateral)   8. DDD (degenerative disc disease), cervical   9. Shoulder blade  pain (Right)    Maria Spencer has been dealing with the above chronic pain for longer than three months and has either failed to respond, was unable to tolerate, or simply did not get enough benefit from other more conservative therapies including, but not limited to: 1. Over-the-counter medications 2. Anti-inflammatory medications 3. Muscle relaxants 4. Membrane stabilizers 5. Opioids 6. Physical therapy and/or chiropractic manipulation 7. Modalities (Heat, ice, etc.) 8. Invasive techniques such as nerve blocks. [name] has attained more than 50% relief of the pain from a series of diagnostic injections conducted in separate occasions.  Pain Score: Pre-procedure: 6 /10 Post-procedure: 0-No pain/10    Target: Cervical medial nerve  Location: Posterolateral aspect of the waist of the cervical articular pillar. Region: Cervical  Approach: Paramedial  Type of procedure: Radiofrequency Neurolytic Ablation   Position / Prep / Materials:  Position: Prone with head of the table was raised to facilitate breathing.  Prep solution: DuraPrep (Iodine Povacrylex [0.7% available iodine] and Isopropyl Alcohol, 74% w/w) Prep Area: Entire Posterior Cervical Region  Materials:  Tray:  RFA (Radiofrequency) tray Needle(s):  Type: RFA (Teflon-coated radiofrequency ablation needles)  Gauge (G): 22  Length: Short (5cm)  Qty: 5      H&P (Pre-op Assessment):  Maria Spencer is a 77 y.o. (year old), female patient, seen today for interventional treatment. She  has a past surgical history that includes Total shoulder replacement (Right, 2012); Shoulder surgery (Left, 2008, 2009); Tonsillectomy; Bone graft hip iliac crest (2018); Spine surgery (10/23/2017); Ventral hernia repair (N/A, 05/29/2018); Insertion of mesh (N/A, 05/29/2018); Cardiac catheterization (2001); Breast lumpectomy (Right, 2009); Breast biopsy (Left, 2009); Breast biopsy (Right, 2009); Colonoscopy with propofol (N/A, 05/15/2020); Spinal fusion  (07/24/2020); Cataract extraction, bilateral (Bilateral, 10/2019); Tubal ligation; Quadriceps tendon repair (Right, 12/05/2020); right gluteus medius/minimus repair Dr. Joice Lofts 11/2020 ; Total knee arthroplasty (Left, 03/18/2022); Hardware Removal (N/A, 03/12/2023); and Colon surgery (03/2023). Ms.  Spencer has a current medication list which includes the following prescription(s): atorvastatin, b complex vitamins, calcium carb-cholecalciferol, cholecalciferol, d-mannose, hydrocodone-acetaminophen, [START ON 08/12/2023] hydrocodone-acetaminophen, levothyroxine, melatonin, metoprolol succinate, multi-vitamins, telmisartan, tizanidine, and tramadol, and the following Facility-Administered Medications: fentanyl. Her primarily concern today is the Neck Pain (Right side)  Initial Vital Signs:  Pulse/HCG Rate: 82ECG Heart Rate: 73 Temp: (!) 97.2 F (36.2 C) Resp: 16 BP: (!) 169/84 SpO2: 97 %  BMI: Estimated body mass index is 26.05 kg/m as calculated from the following:   Height as of this encounter: 4\' 11"  (1.499 m).   Weight as of this encounter: 129 lb (58.5 kg).  Risk Assessment: Allergies: Reviewed. She is allergic to sulfa antibiotics, nitrofuran derivatives, and amlodipine besylate.  Allergy Precautions: None required Coagulopathies: Reviewed. None identified.  Blood-thinner therapy: None at this time Active Infection(s): Reviewed. None identified. Maria Spencer is afebrile  Site Confirmation: Maria Spencer was asked to confirm the procedure and laterality before marking the site Procedure checklist: Completed Consent: Before the procedure and under the influence of no sedative(s), amnesic(s), or anxiolytics, the patient was informed of the treatment options, risks and possible complications. To fulfill our ethical and legal obligations, as recommended by the American Medical Association's Code of Ethics, I have informed the patient of my clinical impression; the nature and purpose of the treatment or  procedure; the risks, benefits, and possible complications of the intervention; the alternatives, including doing nothing; the risk(s) and benefit(s) of the alternative treatment(s) or procedure(s); and the risk(s) and benefit(s) of doing nothing. The patient was provided information about the general risks and possible complications associated with the procedure. These may include, but are not limited to: failure to achieve desired goals, infection, bleeding, organ or nerve damage, allergic reactions, paralysis, and death. In addition, the patient was informed of those risks and complications associated to Spine-related procedures, such as failure to decrease pain; infection (i.e.: Meningitis, epidural or intraspinal abscess); bleeding (i.e.: epidural hematoma, subarachnoid hemorrhage, or any other type of intraspinal or peri-dural bleeding); organ or nerve damage (i.e.: Any type of peripheral nerve, nerve root, or spinal cord injury) with subsequent damage to sensory, motor, and/or autonomic systems, resulting in permanent pain, numbness, and/or weakness of one or several areas of the body; allergic reactions; (i.e.: anaphylactic reaction); and/or death. Furthermore, the patient was informed of those risks and complications associated with the medications. These include, but are not limited to: allergic reactions (i.e.: anaphylactic or anaphylactoid reaction(s)); adrenal axis suppression; blood sugar elevation that in diabetics may result in ketoacidosis or comma; water retention that in patients with history of congestive heart failure may result in shortness of breath, pulmonary edema, and decompensation with resultant heart failure; weight gain; swelling or edema; medication-induced neural toxicity; particulate matter embolism and blood vessel occlusion with resultant organ, and/or nervous system infarction; and/or aseptic necrosis of one or more joints. Finally, the patient was informed that Medicine is  not an exact science; therefore, there is also the possibility of unforeseen or unpredictable risks and/or possible complications that may result in a catastrophic outcome. The patient indicated having understood very clearly. We have given the patient no guarantees and we have made no promises. Enough time was given to the patient to ask questions, all of which were answered to the patient's satisfaction. Ms. Redditt has indicated that she wanted to continue with the procedure. Attestation: I, the ordering provider, attest that I have discussed with the patient the benefits, risks, side-effects, alternatives, likelihood of achieving goals,  and potential problems during recovery for the procedure that I have provided informed consent. Date  Time: 08/05/2023  9:06 AM   Pre-Procedure Preparation:  Monitoring: As per clinic protocol. Respiration, ETCO2, SpO2, BP, heart rate and rhythm monitor placed and checked for adequate function Safety Precautions: Patient was assessed for positional comfort and pressure points before starting the procedure. Time-out: I initiated and conducted the "Time-out" before starting the procedure, as per protocol. The patient was asked to participate by confirming the accuracy of the "Time Out" information. Verification of the correct person, site, and procedure were performed and confirmed by me, the nursing staff, and the patient. "Time-out" conducted as per Joint Commission's Universal Protocol (UP.01.01.01). Time: 0948 Start Time: 0949 hrs.  Description/Narrative of Procedure:          Start Time: 0949 hrs. Rationale (medical necessity): procedure needed and proper for the diagnosis and/or treatment of the patient's medical symptoms and needs. Procedural Technique Safety Precautions: Aspiration looking for blood return was conducted prior to all injections. At no point did we inject any substances, as a needle was being advanced. No attempts were made at seeking any  paresthesias. Safe injection practices and needle disposal techniques used. Medications properly checked for expiration dates. SDV (single dose vial) medications used. Description of the Procedure: Protocol guidelines were followed. The patient was assisted into a comfortable position. The target area was identified and the area prepped in the usual manner. Skin & deeper tissues infiltrated with local anesthetic. Appropriate amount of time allowed to pass for local anesthetics to take effect. The procedure needles were then advanced to the target area. Proper needle placement secured. Negative aspiration confirmed. Solution injected in intermittent fashion, asking for systemic symptoms every 0.5cc of injectate. The needles were then removed and the area cleansed, making sure to leave some of the prepping solution back to take advantage of its long term bactericidal properties.  Technical description of procedure:  Laterality: See above. Radiofrequency Ablation (RFA)  C3 Medial Branch Nerve RFA: The target area for the C3 dorsal medial articular branch is the lateral concave waist of the articular pillar of C3. Under fluoroscopic guidance, a Radiofrequency needle was inserted until contact was made with os over the postero-lateral aspect of the articular pillar of C3 (target area). Sensory and motor testing was conducted to properly adjust the position of the needle. Once satisfactory placement of the needle was achieved, the numbing solution was slowly injected after negative aspiration for blood. 2.0 mL of the nerve block solution was injected without difficulty or complication. After waiting for at least 3 minutes, the ablation was performed. Once completed, the needle was removed intact. C4 Medial Branch Nerve RFA: The target area for the C4 dorsal medial articular branch is the lateral concave waist of the articular pillar of C4. Under fluoroscopic guidance, a Radiofrequency needle was inserted until  contact was made with os over the postero-lateral aspect of the articular pillar of C4 (target area). Sensory and motor testing was conducted to properly adjust the position of the needle. Once satisfactory placement of the needle was achieved, the numbing solution was slowly injected after negative aspiration for blood. 2.0 mL of the nerve block solution was injected without difficulty or complication. After waiting for at least 3 minutes, the ablation was performed. Once completed, the needle was removed intact. C5 Medial Branch Nerve RFA: The target area for the C5 dorsal medial articular branch is the lateral concave waist of the articular pillar of C5. Under  fluoroscopic guidance, a Radiofrequency needle was inserted until contact was made with os over the postero-lateral aspect of the articular pillar of C5 (target area). Sensory and motor testing was conducted to properly adjust the position of the needle. Once satisfactory placement of the needle was achieved, the numbing solution was slowly injected after negative aspiration for blood. 2.0 mL of the nerve block solution was injected without difficulty or complication. After waiting for at least 3 minutes, the ablation was performed. Once completed, the needle was removed intact. C6 Medial Branch Nerve RFA: The target area for the C6 dorsal medial articular branch is the lateral concave waist of the articular pillar of C6. Under fluoroscopic guidance, a Radiofrequency needle was inserted until contact was made with os over the postero-lateral aspect of the articular pillar of C6 (target area). Sensory and motor testing was conducted to properly adjust the position of the needle. Once satisfactory placement of the needle was achieved, the numbing solution was slowly injected after negative aspiration for blood. 2.0 mL of the nerve block solution was injected without difficulty or complication. After waiting for at least 3 minutes, the ablation was  performed. Once completed, the needle was removed intact. C7 Medial Branch Nerve RFA: The target for the C7 dorsal medial articular branch lies on the superior-medial tip of the C7 transverse process. Under fluoroscopic guidance, a Radiofrequency needle was inserted until contact was made with os over the postero-lateral aspect of the articular pillar of C7 (target area). Sensory and motor testing was conducted to properly adjust the position of the needle. Once satisfactory placement of the needle was achieved, the numbing solution was slowly injected after negative aspiration for blood. 2.0 mL of the nerve block solution was injected without difficulty or complication. After waiting for at least 3 minutes, the ablation was performed. Once completed, the needle was removed intact.  Radiofrequency lesioning (ablation):  Radiofrequency Generator: Medtronic AccurianTM AG 1000 RF Generator Sensory Stimulation Parameters: 50 Hz was used to locate & identify the nerve, making sure that the needle was positioned such that there was no sensory stimulation below 0.3 V or above 0.7 V. Motor Stimulation Parameters: 2 Hz was used to evaluate the motor component. Care was taken not to lesion any nerves that demonstrated motor stimulation of the lower extremities at an output of less than 2.5 times that of the sensory threshold, or a maximum of 2.0 V. Lesioning Technique Parameters: Standard Radiofrequency settings. (Not bipolar or pulsed.) Temperature Settings: 80 degrees C Lesioning time: 60 seconds Stationary intra-operative compliance: Compliant   Once the entire procedure was completed, the treated area was cleaned, making sure to leave some of the prepping solution back to take advantage of its long term bactericidal properties. Stationary intra-operative compliance: Compliant  Anatomy Reference Guide:         Facet Joint Innervation  C1-2 Third occipital Nerve (TON)  C2-3 TON, C3  Medial Branch   C3-4 C3, C4         "          "  C4-5 C4, C5         "          "  C5-6 C5, C6         "          "  C6-7 C6, C7         "          "  C7-T1 C7, C8         "          "  Cervical Facet Pain Pattern overlap:   Vitals:   08/05/23 1020 08/05/23 1027 08/05/23 1037 08/05/23 1047  BP: (!) 153/97 (!) 151/82 (!) 145/88 (!) 164/90  Pulse:      Resp: 16 16 16 16   Temp:  (!) 97 F (36.1 C)    SpO2: 100% 100% 98% 100%  Weight:      Height:         End Time: 1019 hrs.  Imaging Guidance (Spinal):          Type of Imaging Technique: Fluoroscopy Guidance (Spinal) Indication(s): Assistance in needle guidance and placement for procedures requiring needle placement in or near specific anatomical locations not easily accessible without such assistance. Exposure Time: Please see nurses notes. Contrast: None used. Fluoroscopic Guidance: I was personally present during the use of fluoroscopy. "Tunnel Vision Technique" used to obtain the best possible view of the target area. Parallax error corrected before commencing the procedure. "Direction-depth-direction" technique used to introduce the needle under continuous pulsed fluoroscopy. Once target was reached, antero-posterior, oblique, and lateral fluoroscopic projection used confirm needle placement in all planes. Images permanently stored in EMR. Interpretation: No contrast injected. I personally interpreted the imaging intraoperatively. Adequate needle placement confirmed in multiple planes. Permanent images saved into the patient's record.  Post-operative Assessment:  Post-procedure Vital Signs:  Pulse/HCG Rate: 8269 Temp: (!) 97 F (36.1 C) Resp: 16 BP: (!) 164/90 SpO2: 100 %  EBL: None  Complications: No immediate post-treatment complications observed by team, or reported by patient.  Note: The patient tolerated the entire procedure well. A repeat set of vitals were taken after the procedure and the patient was kept under observation  following institutional policy, for this type of procedure. Post-procedural neurological assessment was performed, showing return to baseline, prior to discharge. The patient was provided with post-procedure discharge instructions, including a section on how to identify potential problems. Should any problems arise concerning this procedure, the patient was given instructions to immediately contact us, at any time, without hesitation. In any case, we plan to contact the patient by telephone for a follow-up status report regarding this interventional procedure.  Comments:  No additional relevant information.  Plan of Care (POC)  Orders:  Orders Placed This Encounter  Procedures   Radiofrequency,Cervical    Scheduling Instructions:     Side(s): Right-sided     Level(s): C3, C4, C5, and C6 Medial Branch Nerves     Sedation: With Sedation.     Timeframe: Today    Order Specific Question:   Where will this procedure be performed?    Answer:   [facility]   Radiofrequency,Cervical    Standing Status:   Future    Standing Expiration Date:   12/05/2023    Scheduling Instructions:     Side(s): Left-sided     Level(s): C3, C4, C5, and C6 Medial Branch Nerves     Sedation: With Sedation.     Scheduling Timeframe: 2 weeks from now    Order Specific Question:   Where will this procedure be performed?    Answer:   [facility]   DG PAIN CLINIC C-ARM 1-60 MIN NO REPORT    Intraoperative interpretation by procedural physician at [facility].    Standing Status:   Standing    Number of Occurrences:   1    Order Specific Question:   Reason for exam:    Answer:   Assistance in needle guidance and placement for procedures requiring needle placement in  or near specific anatomical locations not easily accessible without such assistance.   Informed Consent Details: Physician/Practitioner Attestation; Transcribe to consent form and obtain patient signature    Nursing Order:  Transcribe to consent form and obtain patient signature. Note: Always confirm laterality of pain with Ms. Gustafson, before procedure.    Order Specific Question:   Physician/Practitioner attestation of informed consent for procedure/surgical case    Answer:   I, the physician/practitioner, attest that I have discussed with the patient the benefits, risks, side effects, alternatives, likelihood of achieving goals and potential problems during recovery for the procedure that I have provided informed consent.    Order Specific Question:   Procedure    Answer:   Cervical Facet Radiofrequency Ablation    Order Specific Question:   Physician/Practitioner performing the procedure    Answer:   Skarleth Delmonico A. Laban Emperor, MD    Order Specific Question:   Indication/Reason    Answer:   Neck Pain (Cervicalgia), with our without referred arm pain, due to Facet Joint Arthralgia (Joint Pain) known as Cervical Facet Syndrome, secondary to Cervical, and/or Cervico-thoracic Spondylosis (Arthritis of the Spine), without myelopathy or radiculop   Provide equipment / supplies at bedside    Procedure tray: "Radiofrequency Tray" Additional material: Large hemostat (x1); Small hemostat (x1); Towels (x8); 4x4 sterile sponge pack (x1) Needle type: Teflon-coated Radiofrequency Needle (Disposable  single use) Size: Short Quantity: 5    Standing Status:   Standing    Number of Occurrences:   1    Order Specific Question:   Specify    Answer:   Radiofrequency Tray   Chronic Opioid Analgesic:  No opioid analgesics prescribed by our practice. Highest recorded MME/day: 75 mg/day MME/day: 0 mg/day   Medications ordered for procedure: Meds ordered this encounter  Medications   lidocaine (XYLOCAINE) 2 % (with pres) injection 400 mg   pentafluoroprop-tetrafluoroeth (GEBAUERS) aerosol   lactated ringers infusion   midazolam (VERSED) 5 MG/5ML injection 0.5-2 mg    Make sure Flumazenil is available in the pyxis when using this  medication. If oversedation occurs, administer 0.2 mg IV over 15 sec. If after 45 sec no response, administer 0.2 mg again over 1 min; may repeat at 1 min intervals; not to exceed 4 doses (1 mg)   fentaNYL (SUBLIMAZE) injection 25-50 mcg    Make sure Narcan is available in the pyxis when using this medication. In the event of respiratory depression (RR< 8/min): Titrate NARCAN (naloxone) in increments of 0.1 to 0.2 mg IV at 2-3 minute intervals, until desired degree of reversal.   ropivacaine (PF) 2 mg/mL (0.2%) (NAROPIN) injection 9 mL   dexamethasone (DECADRON) injection 10 mg   HYDROcodone-acetaminophen (NORCO/VICODIN) 5-325 MG tablet    Sig: Take 1 tablet by mouth every 6 (six) hours as needed for up to 7 days for severe pain. Must last 7 days.    Dispense:  28 tablet    Refill:  0    For acute post-operative pain. Not to be refilled. Must last 7 days.   HYDROcodone-acetaminophen (NORCO/VICODIN) 5-325 MG tablet    Sig: Take 1 tablet by mouth every 6 (six) hours as needed for up to 7 days for severe pain. Must last 7 days.    Dispense:  28 tablet    Refill:  0    For acute post-operative pain. Not to be refilled.  Must last 7 days.   Medications administered: We administered lidocaine, pentafluoroprop-tetrafluoroeth, lactated ringers, midazolam, fentaNYL, ropivacaine (PF) 2 mg/mL (  0.2%), and dexamethasone.  See the medical record for exact dosing, route, and time of administration.  Follow-up plan:   Return in about 2 weeks (around 08/19/2023) for (ECT): (L) C-FCT RFA #1 + PPE.       Interventional Therapies  Risk Factors  Considerations:   Aortic atherosclerosis  osteoporosis  HTN  GERD  palpitations  prediabetes  SOB  Hx. of breast cancer   Planned  Pending:   Therapeutic right cervical FCT RFA #1   (02/05/2023) TENS ordered   Under consideration:   Diagnostic left inferolateral and recurrent genicular NB #1  Therapeutic left inferolateral and recurrent genicular nerve  RFA #1  Diagnostic right T9-10 thoracic ESI #1  Diagnostic bilateral T9, T10, T11, and T12 MMB thoracic facet RFA (DIFICULT CANDIDATE DUE TO HARDWARE)    Completed:   Diagnostic left genicular NB x1 (07/03/2021) (100/100/50/50)  Therapeutic left genicular nerve RFA x1 (08/09/2021) (100/100/30/0)  Diagnostic left cervical facet MBB x1 (10/26/2020) (100/100/0)  Diagnostic right cervical facet MBB x2 (03/25/2023) (100/100/50 x 3 wks/ 0)   Diagnostic bilateral suprascapular NB x1 (10/05/2020) (100/100/100 x1 day/0)  Diagnostic right cervical ESI x1 (09/14/2020) (100/100/100/90-100)  Diagnostic/therapeutic bilateral T9-T12 thoracic facet MBB x2 (06/06/2020) (100/100/0)  Diagnostic/Therapeutic right trapezoid bursa injection x1 (11/09/2020) (did not follow-up)    Therapeutic  Palliative (PRN) options:   Therapeutic/palliative bilateral T9-12 thoracic facet MBB #3  Therapeutic/palliative right trapezoid bursa injection #2        Recent Visits Date Type Provider Dept  06/23/23 Office Visit Delano Metz, MD [facility]  Showing recent visits within past 90 days and meeting all other requirements Today's Visits Date Type Provider Dept  08/05/23 Procedure visit Delano Metz, MD [facility]  Showing today's visits and meeting all other requirements Future Appointments No visits were found meeting these conditions. Showing future appointments within next 90 days and meeting all other requirements  Disposition: Discharge home  Discharge (Date  Time): 08/05/2023; 1048 hrs.   Primary Care Physician: Dana Allan, MD Location: [facility] Note by: Oswaldo Done, MD (TTS technology used. I apologize for any typographical errors that were not detected and corrected.) Date: 08/05/2023; Time: 11:20 AM  Disclaimer:  Medicine is not an Visual merchandiser. The only guarantee in medicine is that nothing is guaranteed. It is important to note  that the decision to proceed with this intervention was based on the information collected from the patient. The Data and conclusions were drawn from the patient's questionnaire, the interview, and the physical examination. Because the information was provided in large part by the patient, it cannot be guaranteed that it has not been purposely or unconsciously manipulated. Every effort has been made to obtain as much relevant data as possible for this evaluation. It is important to note that the conclusions that lead to this procedure are derived in large part from the available data. Always take into account that the treatment will also be dependent on availability of resources and existing treatment guidelines, considered by other Pain Management Practitioners as being common knowledge and practice, at the time of the intervention. For Medico-Legal purposes, it is also important to point out that variation in procedural techniques and pharmacological choices are the acceptable norm. The indications, contraindications, technique, and results of the above procedure should only be interpreted and judged by a Board-Certified Interventional Pain Specialist with extensive familiarity and expertise in the same exact procedure and technique.

## 2023-08-05 ENCOUNTER — Ambulatory Visit: Payer: Medicare Other | Attending: Pain Medicine | Admitting: Pain Medicine

## 2023-08-05 ENCOUNTER — Telehealth: Payer: Self-pay | Admitting: Family Medicine

## 2023-08-05 ENCOUNTER — Encounter: Payer: Self-pay | Admitting: Pain Medicine

## 2023-08-05 ENCOUNTER — Ambulatory Visit
Admission: RE | Admit: 2023-08-05 | Discharge: 2023-08-05 | Disposition: A | Discharge status: Home / Self Care | Payer: Medicare Other | Source: Ambulatory Visit | Attending: Pain Medicine | Admitting: Pain Medicine

## 2023-08-05 VITALS — BP 164/90 | HR 82 | Temp 97.0°F | Resp 16 | Ht 59.0 in | Wt 129.0 lb

## 2023-08-05 DIAGNOSIS — M5030 Other cervical disc degeneration, unspecified cervical region: Secondary | ICD-10-CM | POA: Diagnosis not present

## 2023-08-05 DIAGNOSIS — G4486 Cervicogenic headache: Secondary | ICD-10-CM | POA: Diagnosis not present

## 2023-08-05 DIAGNOSIS — G5600 Carpal tunnel syndrome, unspecified upper limb: Secondary | ICD-10-CM | POA: Insufficient documentation

## 2023-08-05 DIAGNOSIS — M898X1 Other specified disorders of bone, shoulder: Secondary | ICD-10-CM

## 2023-08-05 DIAGNOSIS — M5481 Occipital neuralgia: Secondary | ICD-10-CM

## 2023-08-05 DIAGNOSIS — M47812 Spondylosis without myelopathy or radiculopathy, cervical region: Secondary | ICD-10-CM | POA: Diagnosis not present

## 2023-08-05 DIAGNOSIS — M47816 Spondylosis without myelopathy or radiculopathy, lumbar region: Secondary | ICD-10-CM | POA: Insufficient documentation

## 2023-08-05 DIAGNOSIS — M542 Cervicalgia: Secondary | ICD-10-CM

## 2023-08-05 DIAGNOSIS — M19019 Primary osteoarthritis, unspecified shoulder: Secondary | ICD-10-CM | POA: Insufficient documentation

## 2023-08-05 DIAGNOSIS — M5412 Radiculopathy, cervical region: Secondary | ICD-10-CM | POA: Insufficient documentation

## 2023-08-05 DIAGNOSIS — G8918 Other acute postprocedural pain: Secondary | ICD-10-CM

## 2023-08-05 DIAGNOSIS — M75120 Complete rotator cuff tear or rupture of unspecified shoulder, not specified as traumatic: Secondary | ICD-10-CM | POA: Insufficient documentation

## 2023-08-05 DIAGNOSIS — M5417 Radiculopathy, lumbosacral region: Secondary | ICD-10-CM | POA: Insufficient documentation

## 2023-08-05 DIAGNOSIS — Z79891 Long term (current) use of opiate analgesic: Secondary | ICD-10-CM | POA: Insufficient documentation

## 2023-08-05 MED ORDER — LACTATED RINGERS IV SOLN: Freq: Once | INTRAVENOUS | Status: AC

## 2023-08-05 MED ORDER — LIDOCAINE HCL 2 % IJ SOLN
20.0000 mL | Freq: Once | INTRAMUSCULAR | Status: AC
  Administered 2023-08-05: 400 mg

## 2023-08-05 MED ORDER — FENTANYL CITRATE (PF) 100 MCG/2ML IJ SOLN
INTRAMUSCULAR | Status: AC
  Filled 2023-08-05: qty 2

## 2023-08-05 MED ORDER — DEXAMETHASONE SODIUM PHOSPHATE 10 MG/ML IJ SOLN
10.0000 mg | Freq: Once | INTRAMUSCULAR | Status: AC
  Administered 2023-08-05: 10 mg

## 2023-08-05 MED ORDER — HYDROCODONE-ACETAMINOPHEN 5-325 MG PO TABS
1.0000 | ORAL_TABLET | Freq: Four times a day (QID) | ORAL | 0 refills | Status: DC | PRN
Start: 2023-08-05 — End: 2023-08-11

## 2023-08-05 MED ORDER — HYDROCODONE-ACETAMINOPHEN 5-325 MG PO TABS
1.0000 | ORAL_TABLET | Freq: Four times a day (QID) | ORAL | 0 refills | Status: DC | PRN
Start: 2023-08-12 — End: 2023-08-11

## 2023-08-05 MED ORDER — MIDAZOLAM HCL 5 MG/5ML IJ SOLN
0.5000 mg | Freq: Once | INTRAMUSCULAR | Status: AC
  Administered 2023-08-05: 2 mg via INTRAVENOUS

## 2023-08-05 MED ORDER — FENTANYL CITRATE (PF) 100 MCG/2ML IJ SOLN
25.0000 ug | INTRAMUSCULAR | Status: DC | PRN
  Administered 2023-08-05: 50 ug via INTRAVENOUS

## 2023-08-05 MED ORDER — DEXAMETHASONE SODIUM PHOSPHATE 10 MG/ML IJ SOLN
INTRAMUSCULAR | Status: AC
  Filled 2023-08-05: qty 1

## 2023-08-05 MED ORDER — ROPIVACAINE HCL 2 MG/ML IJ SOLN
9.0000 mL | Freq: Once | INTRAMUSCULAR | Status: AC
  Administered 2023-08-05: 9 mL via PERINEURAL

## 2023-08-05 MED ORDER — LIDOCAINE HCL 2 % IJ SOLN
INTRAMUSCULAR | Status: AC
  Filled 2023-08-05: qty 20

## 2023-08-05 MED ORDER — PENTAFLUOROPROP-TETRAFLUOROETH EX AERO
INHALATION_SPRAY | Freq: Once | CUTANEOUS | Status: AC
  Administered 2023-08-05: 30 via TOPICAL
  Filled 2023-08-05: qty 30

## 2023-08-05 MED ORDER — MIDAZOLAM HCL 5 MG/5ML IJ SOLN
INTRAMUSCULAR | Status: AC
  Filled 2023-08-05: qty 5

## 2023-08-05 NOTE — Patient Instructions (Addendum)
You have 2 prescriptions for Hydrocodone each for 7 days to take post RF pain as needed.  ______________________________________________________________________    Post-Radiofrequency (RF) Discharge Instructions  You have just completed a Radiofrequency Neurotomy.  The following instructions will provide you with information and guidelines for self-care upon discharge.  If at any time you have questions or concerns please call your physician. DO NOT DRIVE YOURSELF!!  Instructions: Apply ice: Fill a plastic sandwich bag with crushed ice. Cover it with a small towel and apply to injection site. Apply for 15 minutes then remove x 15 minutes. Repeat sequence on day of procedure, until you go to bed. The purpose is to minimize swelling and discomfort after procedure. Apply heat: Apply heat to procedure site starting the day following the procedure. The purpose is to treat any soreness and discomfort from the procedure. Food intake: No eating limitations, unless stipulated above.  Nevertheless, if you have had sedation, you may experience some nausea.  In this case, it may be wise to wait at least two hours prior to resuming regular diet. Physical activities: Keep activities to a minimum for the first 8 hours after the procedure. For the first 24 hours after the procedure, do not drive a motor vehicle,  Operate heavy machinery, power tools, or handle any weapons.  Consider walking with the use of an assistive device or accompanied by an adult for the first 24 hours.  Do not drink alcoholic beverages including beer.  Do not make any important decisions or sign any legal documents. Go home and rest today.  Resume activities tomorrow, as tolerated.  Use caution in moving about as you may experience mild leg weakness.  Use caution in cooking, use of household electrical appliances and climbing steps. Driving: If you have received any sedation, you are not allowed to drive for 24 hours after your procedure. Blood  thinner: Restart your blood thinner 6 hours after your procedure. (Only for those taking blood thinners) Insulin: As soon as you can eat, you may resume your normal dosing schedule. (Only for those taking insulin) Medications: May resume pre-procedure medications.  Do not take any drugs, other than what has been prescribed to you. Infection prevention: Keep procedure site clean and dry. Post-procedure Pain Diary: Extremely important that this be done correctly and accurately. Recorded information will be used to determine the next step in treatment. Pain evaluated is that of treated area only. Do not include pain from an untreated area. Complete every hour, on the hour, for the initial 8 hours. Set an alarm to help you do this part accurately. Do not go to sleep and have it completed later. It will not be accurate. Follow-up appointment: Keep your follow-up appointment after the procedure. Usually 2-6 weeks after radiofrequency. Bring you pain diary. The information collected will be essential for your long-term care.   Expect: From numbing medicine (AKA: Local Anesthetics): Numbness or decrease in pain. Onset: Full effect within 15 minutes of injected. Duration: It will depend on the type of local anesthetic used. On the average, 1 to 8 hours.  From steroids (when added): Decrease in swelling or inflammation. Once inflammation is improved, relief of the pain will follow. Onset of benefits: Depends on the amount of swelling present. The more swelling, the longer it will take for the benefits to be seen. In some cases, up to 10 days. Duration: Steroids will stay in the system x 2 weeks. Duration of benefits will depend on multiple posibilities including persistent irritating factors.  From procedure: Some discomfort is to be expected once the numbing medicine wears off. In the case of radiofrequency procedures, this may last as long as 6 weeks. Additional post-procedure pain medication is provided for  this. Discomfort is minimized if ice and heat are applied as instructed.  Call if: You experience numbness and weakness that gets worse with time, as opposed to wearing off. He experience any unusual bleeding, difficulty breathing, or loss of the ability to control your bowel and bladder. (This applies to Spinal procedures only) You experience any redness, swelling, heat, red streaks, elevated temperature, fever, or any other signs of a possible infection.  Emergency Numbers: Durning business hours (Monday - Thursday, 8:00 AM - 4:00 PM) (Friday, 9:00 AM - 12:00 Noon): [phone] After hours: [phone] ______________________________________________________________________     ______________________________________________________________________    Procedure instructions  Stop blood-thinners  Do not eat or drink fluids (other than water) for 6 hours before your procedure  No water for 2 hours before your procedure  Take your blood pressure medicine with a sip of water  Arrive 30 minutes before your appointment  If sedation is planned, bring suitable driver. Pennie Banter, Benedetto Goad, & public transportation are NOT APPROVED)  Carefully read the "Preparing for your procedure" detailed instructions  If you have questions call us at [phone]  ______________________________________________________________________      ______________________________________________________________________    Preparing for your procedure  Appointments: If you think you may not be able to keep your appointment, call 24-48 hours in advance to cancel. We need time to make it available to others.  During your procedure appointment there will be: No Prescription Refills. No disability issues to discussed. No medication changes or discussions.  Instructions: Food intake: Avoid eating anything solid for at least 8 hours prior to your procedure. Clear liquid intake: You may take clear liquids such  as water up to 2 hours prior to your procedure. (No carbonated drinks. No soda.) Transportation: Unless otherwise stated by your physician, bring a driver. (Driver cannot be a Market researcher, Pharmacist, community, or any other form of public transportation.) Morning Medicines: Except for blood thinners, take all of your other morning medications with a sip of water. Make sure to take your heart and blood pressure medicines. If your blood pressure's lower number is above 100, the case will be rescheduled. Blood thinners: Make sure to stop your blood thinners as instructed.  If you take a blood thinner, but were not instructed to stop it, call our office [phone] and ask to talk to a nurse. Not stopping a blood thinner prior to certain procedures could lead to serious complications. Diabetics on insulin: Notify the staff so that you can be scheduled 1st case in the morning. If your diabetes requires high dose insulin, take only  of your normal insulin dose the morning of the procedure and notify the staff that you have done so. Preventing infections: Shower with an antibacterial soap the morning of your procedure.  Build-up your immune system: Take 1000 mg of Vitamin C with every meal (3 times a day) the day prior to your procedure. Antibiotics: Inform the nursing staff if you are taking any antibiotics or if you have any conditions that may require antibiotics prior to procedures. (Example: recent joint implants)   Pregnancy: If you are pregnant make sure to notify the nursing staff. Not doing so may result in injury to the fetus, including death.  Sickness: If you have a cold, fever, or any active infections,  call and cancel or reschedule your procedure. Receiving steroids while having an infection may result in complications. Arrival: You must be in the facility at least 30 minutes prior to your scheduled procedure. Tardiness: Your scheduled time is also the cutoff time. If you do not arrive at least 15 minutes prior to  your procedure, you will be rescheduled.  Children: Do not bring any children with you. Make arrangements to keep them home. Dress appropriately: There is always a possibility that your clothing may get soiled. Avoid long dresses. Valuables: Do not bring any jewelry or valuables.  Reasons to call and reschedule or cancel your procedure: (Following these recommendations will minimize the risk of a serious complication.) Surgeries: Avoid having procedures within 2 weeks of any surgery. (Avoid for 2 weeks before or after any surgery). Flu Shots: Avoid having procedures within 2 weeks of a flu shots or . (Avoid for 2 weeks before or after immunizations). Barium: Avoid having a procedure within 7-10 days after having had a radiological study involving the use of radiological contrast. (Myelograms, Barium swallow or enema study). Heart attacks: Avoid any elective procedures or surgeries for the initial 6 months after a "Myocardial Infarction" (Heart Attack). Blood thinners: It is imperative that you stop these medications before procedures. Let us know if you if you take any blood thinner.  Infection: Avoid procedures during or within two weeks of an infection (including chest colds or gastrointestinal problems). Symptoms associated with infections include: Localized redness, fever, chills, night sweats or profuse sweating, burning sensation when voiding, cough, congestion, stuffiness, runny nose, sore throat, diarrhea, nausea, vomiting, cold or Flu symptoms, recent or current infections. It is specially important if the infection is over the area that we intend to treat. Heart and lung problems: Symptoms that may suggest an active cardiopulmonary problem include: cough, chest pain, breathing difficulties or shortness of breath, dizziness, ankle swelling, uncontrolled high or unusually low blood pressure, and/or palpitations. If you are experiencing any of these symptoms, cancel your procedure and contact your  primary care physician for an evaluation.  Remember:  Regular Business hours are:  Monday to Thursday 8:00 AM to 4:00 PM  Provider's Schedule: Delano Metz, MD:  Procedure days: Tuesday and Thursday 7:30 AM to 4:00 PM  Edward Jolly, MD:  Procedure days: Monday and Wednesday 7:30 AM to 4:00 PM Last  Updated: 07/15/2023 ______________________________________________________________________      ______________________________________________________________________    General Risks and Possible Complications  Patient Responsibilities: It is important that you read this as it is part of your informed consent. It is our duty to inform you of the risks and possible complications associated with treatments offered to you. It is your responsibility as a patient to read this and to ask questions about anything that is not clear or that you believe was not covered in this document.  Patient's Rights: You have the right to refuse treatment. You also have the right to change your mind, even after initially having agreed to have the treatment done. However, under this last option, if you wait until the last second to change your mind, you may be charged for the materials used up to that point.  Introduction: Medicine is not an Visual merchandiser. Everything in Medicine, including the lack of treatment(s), carries the potential for danger, harm, or loss (which is by definition: Risk). In Medicine, a complication is a secondary problem, condition, or disease that can aggravate an already existing one. All treatments carry the risk of possible complications.  The fact that a side effects or complications occurs, does not imply that the treatment was conducted incorrectly. It must be clearly understood that these can happen even when everything is done following the highest safety standards.  No treatment: You can choose not to proceed with the proposed treatment alternative. The "PRO(s)" would include:  avoiding the risk of complications associated with the therapy. The "CON(s)" would include: not getting any of the treatment benefits. These benefits fall under one of three categories: diagnostic; therapeutic; and/or palliative. Diagnostic benefits include: getting information which can ultimately lead to improvement of the disease or symptom(s). Therapeutic benefits are those associated with the successful treatment of the disease. Finally, palliative benefits are those related to the decrease of the primary symptoms, without necessarily curing the condition (example: decreasing the pain from a flare-up of a chronic condition, such as incurable terminal cancer).  General Risks and Complications: These are associated to most interventional treatments. They can occur alone, or in combination. They fall under one of the following six (6) categories: no benefit or worsening of symptoms; bleeding; infection; nerve damage; allergic reactions; and/or death. No benefits or worsening of symptoms: In Medicine there are no guarantees, only probabilities. No healthcare provider can ever guarantee that a medical treatment will work, they can only state the probability that it may. Furthermore, there is always the possibility that the condition may worsen, either directly, or indirectly, as a consequence of the treatment. Bleeding: This is more common if the patient is taking a blood thinner, either prescription or over the counter (example: Goody Powders, Fish oil, Aspirin, Garlic, etc.), or if suffering a condition associated with impaired coagulation (example: Hemophilia, cirrhosis of the liver, low platelet counts, etc.). However, even if you do not have one on these, it can still happen. If you have any of these conditions, or take one of these drugs, make sure to notify your treating physician. Infection: This is more common in patients with a compromised immune system, either due to disease (example: diabetes,  cancer, human immunodeficiency virus [HIV], etc.), or due to medications or treatments (example: therapies used to treat cancer and rheumatological diseases). However, even if you do not have one on these, it can still happen. If you have any of these conditions, or take one of these drugs, make sure to notify your treating physician. Nerve Damage: This is more common when the treatment is an invasive one, but it can also happen with the use of medications, such as those used in the treatment of cancer. The damage can occur to small secondary nerves, or to large primary ones, such as those in the spinal cord and brain. This damage may be temporary or permanent and it may lead to impairments that can range from temporary numbness to permanent paralysis and/or brain death. Allergic Reactions: Any time a substance or material comes in contact with our body, there is the possibility of an allergic reaction. These can range from a mild skin rash (contact dermatitis) to a severe systemic reaction (anaphylactic reaction), which can result in death. Death: In general, any medical intervention can result in death, most of the time due to an unforeseen complication. ______________________________________________________________________

## 2023-08-05 NOTE — Telephone Encounter (Signed)
Prescription Request  08/05/2023  90 day supply with 3 refills  LOV: 05/01/2023  What is the name of the medication or equipment? levothyroxine (SYNTHROID) 88 MCG tablet , telmisartan (MICARDIS) 80 MG tablet   Have you contacted your pharmacy to request a refill? Yes   Which pharmacy would you like this sent to?   [facility] - [place], [place] - [address] [place] [address] Phone: [phone] Fax: [phone]    Patient notified that their request is being sent to the clinical staff for review and that they should receive a response within 2 business days.   Please advise at Home [phone]    90 day supply with 3 refills

## 2023-08-05 NOTE — Progress Notes (Signed)
Safety precautions to be maintained throughout the outpatient stay will include: orient to surroundings, keep bed in low position, maintain call bell within reach at all times, provide assistance with transfer out of bed and ambulation.  

## 2023-08-06 ENCOUNTER — Telehealth: Payer: Self-pay | Admitting: *Deleted

## 2023-08-06 NOTE — Telephone Encounter (Signed)
Patient informed that we have received a message the [facility] is out of stock of Hydrocodone, I asked her what other pharmacy she would like to have the script sent to. She stated that she does not need the medication, will not get if filled.

## 2023-08-06 NOTE — Telephone Encounter (Signed)
No problems post procedure. 

## 2023-08-07 DIAGNOSIS — R202 Paresthesia of skin: Secondary | ICD-10-CM | POA: Diagnosis not present

## 2023-08-08 NOTE — Telephone Encounter (Signed)
Pt called and given information to call [facility] to have prescriptions transferred.  While on the phone pt reported that she was having thrush again and needed an appointment.  Scheduled for an appointment 08/11/23

## 2023-08-11 ENCOUNTER — Ambulatory Visit (INDEPENDENT_AMBULATORY_CARE_PROVIDER_SITE_OTHER): Payer: Medicare Other | Admitting: Family Medicine

## 2023-08-11 ENCOUNTER — Encounter: Payer: Self-pay | Admitting: Family Medicine

## 2023-08-11 ENCOUNTER — Other Ambulatory Visit: Payer: Self-pay | Admitting: Family Medicine

## 2023-08-11 VITALS — BP 138/78 | HR 84 | Temp 97.7°F | Resp 16 | Ht 59.0 in | Wt 135.2 lb

## 2023-08-11 DIAGNOSIS — E785 Hyperlipidemia, unspecified: Secondary | ICD-10-CM | POA: Diagnosis not present

## 2023-08-11 DIAGNOSIS — Z1231 Encounter for screening mammogram for malignant neoplasm of breast: Secondary | ICD-10-CM

## 2023-08-11 DIAGNOSIS — I10 Essential (primary) hypertension: Secondary | ICD-10-CM

## 2023-08-11 DIAGNOSIS — J329 Chronic sinusitis, unspecified: Secondary | ICD-10-CM

## 2023-08-11 DIAGNOSIS — E039 Hypothyroidism, unspecified: Secondary | ICD-10-CM | POA: Diagnosis not present

## 2023-08-11 DIAGNOSIS — B370 Candidal stomatitis: Secondary | ICD-10-CM

## 2023-08-11 MED ORDER — NYSTATIN 100000 UNIT/ML MT SUSP
5.0000 mL | Freq: Four times a day (QID) | OROMUCOSAL | 0 refills | Status: DC
Start: 2023-08-11 — End: 2023-08-16

## 2023-08-11 MED ORDER — ATORVASTATIN CALCIUM 20 MG PO TABS
20.0000 mg | ORAL_TABLET | Freq: Every day | ORAL | 3 refills | Status: DC
Start: 2023-08-11 — End: 2024-06-14

## 2023-08-11 MED ORDER — TELMISARTAN 80 MG PO TABS
80.0000 mg | ORAL_TABLET | Freq: Every day | ORAL | 3 refills | Status: DC
Start: 2023-08-11 — End: 2024-08-30

## 2023-08-11 MED ORDER — FLUCONAZOLE 150 MG PO TABS: 150.0000 mg | ORAL_TABLET | Freq: Every day | ORAL | 0 refills | Status: DC

## 2023-08-11 MED ORDER — METOPROLOL SUCCINATE ER 50 MG PO TB24
50.0000 mg | ORAL_TABLET | Freq: Every day | ORAL | 2 refills | Status: DC
Start: 2023-08-11 — End: 2024-05-25

## 2023-08-11 MED ORDER — LEVOTHYROXINE SODIUM 88 MCG PO TABS
88.0000 ug | ORAL_TABLET | Freq: Every day | ORAL | 3 refills | Status: DC
Start: 2023-08-11 — End: 2024-08-30

## 2023-08-11 NOTE — Patient Instructions (Addendum)
It was a pleasure meeting you today. Thank you for allowing me to take part in your health care.  Our goals for today as we discussed include:  Start Nystatin 5 ml swish and swallow 4 times a day for 5 days Take Diflucan 1 tablet once Follow up if no improvement in symptoms  DMV forms completed.  Please sign and send in.  Refills sent for all requested medications to [facility]  Recommend Flu and COVID vaccine  Follow up as needed  If you have any questions or concerns, please do not hesitate to call the office at [phone].  I look forward to our next visit and until then take care and stay safe.  Regards,   Dana Allan, MD   [facility]

## 2023-08-11 NOTE — Progress Notes (Signed)
SUBJECTIVE:   Chief Complaint  Patient presents with   Thrush   HPI Presents for acute visit  Oral Thrush Symptoms started 1 month ago.  Used OTC mouthwash and somewhat resolved.  Recently symptoms recurred.  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  Requesting refill of Metoprolol and Micardis. Does not check BP at home.    Hypothyroid Asymptomatic. Requesting refill for Levothyroxine  HLD Tolerating statin therapy.  Requesting refill of Lipitor  PERTINENT PMH / PSH: HTN GERD Hypothyroid HLD  OBJECTIVE:  BP 138/78   Pulse 84   Temp 97.7 F (36.5 C)   Resp 16   Ht 4\' 11"  (1.499 m)   Wt 135 lb 4 oz (61.3 kg)   SpO2 95%   BMI 27.32 kg/m    Physical Exam HENT:     Mouth/Throat:     Mouth: Mucous membranes are moist.     Pharynx: Posterior oropharyngeal erythema present. No pharyngeal swelling, oropharyngeal exudate or uvula swelling.     Comments: Candida noted on tongue       08/11/2023    9:50 AM 06/16/2023   10:40 AM 05/01/2023    3:40 PM 02/05/2023    1:22 PM 12/17/2022   11:40 AM  Depression screen PHQ 2/9  Decreased Interest 0 0 1 0 1  Down, Depressed, Hopeless 1 0 1 0 0  PHQ - 2 Score 1 0 2 0 1  Altered sleeping 1 3 1  2   Tired, decreased energy 2 1 2  2   Change in appetite 0 0 0  1  Feeling bad or failure about yourself  0 0 0  0  Trouble concentrating 0 0 0  2  Moving slowly or fidgety/restless 0 0 3  0  Suicidal thoughts 0 0 0  0  PHQ-9 Score 4 4 8  8   Difficult doing work/chores Not difficult at all Not difficult at all Not difficult at all  Somewhat difficult      08/11/2023    9:51 AM 05/01/2023    3:42 PM 12/17/2022   11:41 AM  GAD 7 : Generalized Anxiety Score  Nervous, Anxious, on Edge 0 0 0  Control/stop worrying 1 1 0  Worry too much - different things 1 1 0  Trouble relaxing 0 1 0  Restless 0 0 0  Easily annoyed or irritable 0 0 0   Afraid - awful might happen 0 0 0  Total GAD 7 Score 2 3 0  Anxiety Difficulty Not difficult at all Not difficult at all Not difficult at all    ASSESSMENT/PLAN:  Thrush -     Fluconazole; Take 1 tablet (150 mg total) by mouth daily.  Dispense: 1 tablet; Refill: 0 -     Nystatin; Take 5 mLs (500,000 Units total) by mouth 4 (four) times daily for 5 days.  Dispense: 60 mL; Refill: 0  Hypothyroidism, unspecified type -     Levothyroxine Sodium; Take 1 tablet (88 mcg total) by mouth daily before breakfast. 30 minutes  Dispense: 90 tablet; Refill: 3  Hyperlipidemia, unspecified hyperlipidemia type -     Atorvastatin Calcium; Take 1 tablet (20 mg total) by mouth daily.  Dispense: 90 tablet; Refill: 3  Essential hypertension -     Telmisartan; Take 1 tablet (80 mg total) by mouth daily.  Dispense: 90 tablet; Refill: 3 -     Metoprolol Succinate ER; Take 1 tablet (50  mg total) by mouth daily. Take with or immediately following a meal.  Dispense: 90 tablet; Refill: 2   DMV forms completed and returned to patient  PDMP reviewed  Return if symptoms worsen or fail to improve, for PCP.  Dana Allan, MD

## 2023-08-13 DIAGNOSIS — R269 Unspecified abnormalities of gait and mobility: Secondary | ICD-10-CM | POA: Diagnosis not present

## 2023-08-18 DIAGNOSIS — M25551 Pain in right hip: Secondary | ICD-10-CM | POA: Diagnosis not present

## 2023-08-18 DIAGNOSIS — R269 Unspecified abnormalities of gait and mobility: Secondary | ICD-10-CM | POA: Diagnosis not present

## 2023-08-18 DIAGNOSIS — M25552 Pain in left hip: Secondary | ICD-10-CM | POA: Diagnosis not present

## 2023-08-20 ENCOUNTER — Telehealth: Payer: Self-pay

## 2023-08-20 NOTE — Telephone Encounter (Signed)
I called the patient to schedule her left RFA. She has questions that she wants to speak to someone before we schedule it. Can someone call her today. She will be gone from 11:30 -2pm today so either call before or after. Thanks.

## 2023-08-20 NOTE — Telephone Encounter (Signed)
Patient reports an uncomfortable sensation in the right side of her neck, but when she places her finger on a particular area , it resolves. I informed her that this may be expected, that she may experience symptoms up to 6 weeks following the RFA. She said her question was resolved, she has no other questions.

## 2023-08-23 ENCOUNTER — Other Ambulatory Visit: Payer: Self-pay | Admitting: Neurosurgery

## 2023-08-25 DIAGNOSIS — Z23 Encounter for immunization: Secondary | ICD-10-CM | POA: Diagnosis not present

## 2023-08-27 DIAGNOSIS — R269 Unspecified abnormalities of gait and mobility: Secondary | ICD-10-CM | POA: Diagnosis not present

## 2023-08-27 DIAGNOSIS — M25552 Pain in left hip: Secondary | ICD-10-CM | POA: Diagnosis not present

## 2023-08-27 DIAGNOSIS — M25551 Pain in right hip: Secondary | ICD-10-CM | POA: Diagnosis not present

## 2023-08-29 DIAGNOSIS — R269 Unspecified abnormalities of gait and mobility: Secondary | ICD-10-CM | POA: Diagnosis not present

## 2023-08-29 DIAGNOSIS — M25552 Pain in left hip: Secondary | ICD-10-CM | POA: Diagnosis not present

## 2023-08-29 DIAGNOSIS — M25551 Pain in right hip: Secondary | ICD-10-CM | POA: Diagnosis not present

## 2023-09-01 DIAGNOSIS — M25552 Pain in left hip: Secondary | ICD-10-CM | POA: Diagnosis not present

## 2023-09-01 DIAGNOSIS — R269 Unspecified abnormalities of gait and mobility: Secondary | ICD-10-CM | POA: Diagnosis not present

## 2023-09-01 DIAGNOSIS — M25551 Pain in right hip: Secondary | ICD-10-CM | POA: Diagnosis not present

## 2023-09-03 DIAGNOSIS — R269 Unspecified abnormalities of gait and mobility: Secondary | ICD-10-CM | POA: Diagnosis not present

## 2023-09-03 DIAGNOSIS — M25551 Pain in right hip: Secondary | ICD-10-CM | POA: Diagnosis not present

## 2023-09-03 DIAGNOSIS — M25552 Pain in left hip: Secondary | ICD-10-CM | POA: Diagnosis not present

## 2023-09-08 ENCOUNTER — Encounter: Payer: Self-pay | Admitting: Family Medicine

## 2023-09-08 DIAGNOSIS — R269 Unspecified abnormalities of gait and mobility: Secondary | ICD-10-CM | POA: Diagnosis not present

## 2023-09-08 DIAGNOSIS — M25552 Pain in left hip: Secondary | ICD-10-CM | POA: Diagnosis not present

## 2023-09-08 DIAGNOSIS — M25551 Pain in right hip: Secondary | ICD-10-CM | POA: Diagnosis not present

## 2023-09-09 ENCOUNTER — Ambulatory Visit: Payer: Medicare Other | Attending: Pain Medicine | Admitting: Pain Medicine

## 2023-09-09 ENCOUNTER — Encounter: Payer: Self-pay | Admitting: Pain Medicine

## 2023-09-09 ENCOUNTER — Ambulatory Visit
Admission: RE | Admit: 2023-09-09 | Discharge: 2023-09-09 | Disposition: A | Discharge status: Home / Self Care | Payer: Medicare Other | Source: Ambulatory Visit | Attending: Pain Medicine | Admitting: Pain Medicine

## 2023-09-09 VITALS — BP 140/73 | HR 77 | Temp 97.6°F | Resp 20 | Ht 59.0 in | Wt 132.0 lb

## 2023-09-09 DIAGNOSIS — G8929 Other chronic pain: Secondary | ICD-10-CM | POA: Diagnosis not present

## 2023-09-09 DIAGNOSIS — M25511 Pain in right shoulder: Secondary | ICD-10-CM | POA: Insufficient documentation

## 2023-09-09 DIAGNOSIS — Z5189 Encounter for other specified aftercare: Secondary | ICD-10-CM

## 2023-09-09 DIAGNOSIS — M47812 Spondylosis without myelopathy or radiculopathy, cervical region: Secondary | ICD-10-CM | POA: Diagnosis not present

## 2023-09-09 DIAGNOSIS — Z09 Encounter for follow-up examination after completed treatment for conditions other than malignant neoplasm: Secondary | ICD-10-CM

## 2023-09-09 DIAGNOSIS — M5030 Other cervical disc degeneration, unspecified cervical region: Secondary | ICD-10-CM | POA: Diagnosis not present

## 2023-09-09 DIAGNOSIS — M25512 Pain in left shoulder: Secondary | ICD-10-CM | POA: Diagnosis not present

## 2023-09-09 DIAGNOSIS — G4486 Cervicogenic headache: Secondary | ICD-10-CM | POA: Diagnosis not present

## 2023-09-09 DIAGNOSIS — M542 Cervicalgia: Secondary | ICD-10-CM

## 2023-09-09 DIAGNOSIS — G8918 Other acute postprocedural pain: Secondary | ICD-10-CM | POA: Diagnosis not present

## 2023-09-09 MED ORDER — LIDOCAINE HCL 2 % IJ SOLN
INTRAMUSCULAR | Status: AC
  Filled 2023-09-09: qty 20

## 2023-09-09 MED ORDER — HYDROCODONE-ACETAMINOPHEN 5-325 MG PO TABS
1.0000 | ORAL_TABLET | Freq: Four times a day (QID) | ORAL | 0 refills | Status: AC | PRN
Start: 2023-09-09 — End: 2023-09-16

## 2023-09-09 MED ORDER — HYDROCODONE-ACETAMINOPHEN 5-325 MG PO TABS
1.0000 | ORAL_TABLET | Freq: Four times a day (QID) | ORAL | 0 refills | Status: AC | PRN
Start: 2023-09-16 — End: 2023-09-23

## 2023-09-09 MED ORDER — FENTANYL CITRATE (PF) 100 MCG/2ML IJ SOLN
INTRAMUSCULAR | Status: AC
  Filled 2023-09-09: qty 2

## 2023-09-09 MED ORDER — PENTAFLUOROPROP-TETRAFLUOROETH EX AERO
INHALATION_SPRAY | Freq: Once | CUTANEOUS | Status: AC
  Administered 2023-09-09: 30 via TOPICAL
  Filled 2023-09-09: qty 30

## 2023-09-09 MED ORDER — ROPIVACAINE HCL 2 MG/ML IJ SOLN
9.0000 mL | Freq: Once | INTRAMUSCULAR | Status: AC
  Administered 2023-09-09: 9 mL via PERINEURAL

## 2023-09-09 MED ORDER — ROPIVACAINE HCL 2 MG/ML IJ SOLN
INTRAMUSCULAR | Status: AC
  Filled 2023-09-09: qty 20

## 2023-09-09 MED ORDER — DEXAMETHASONE SODIUM PHOSPHATE 10 MG/ML IJ SOLN
INTRAMUSCULAR | Status: AC
  Filled 2023-09-09: qty 1

## 2023-09-09 MED ORDER — FENTANYL CITRATE (PF) 100 MCG/2ML IJ SOLN
25.0000 ug | INTRAMUSCULAR | Status: DC | PRN
  Administered 2023-09-09: 25 ug via INTRAVENOUS

## 2023-09-09 MED ORDER — MIDAZOLAM HCL 5 MG/5ML IJ SOLN
0.5000 mg | Freq: Once | INTRAMUSCULAR | Status: AC
  Administered 2023-09-09: 2.5 mg via INTRAVENOUS

## 2023-09-09 MED ORDER — MIDAZOLAM HCL 5 MG/5ML IJ SOLN
INTRAMUSCULAR | Status: AC
  Filled 2023-09-09: qty 5

## 2023-09-09 MED ORDER — LIDOCAINE HCL 2 % IJ SOLN
20.0000 mL | Freq: Once | INTRAMUSCULAR | Status: AC
  Administered 2023-09-09: 400 mg

## 2023-09-09 MED ORDER — LACTATED RINGERS IV SOLN: Freq: Once | INTRAVENOUS | Status: AC

## 2023-09-09 MED ORDER — DEXAMETHASONE SODIUM PHOSPHATE 10 MG/ML IJ SOLN
10.0000 mg | Freq: Once | INTRAMUSCULAR | Status: AC
  Administered 2023-09-09: 10 mg

## 2023-09-09 NOTE — Patient Instructions (Signed)
___________________________________________________________________________________________  Post-Radiofrequency (RF) Discharge Instructions  You have just completed a Radiofrequency Neurotomy.  The following instructions will provide you with information and guidelines for self-care upon discharge.  If at any time you have questions or concerns please call your physician. DO NOT DRIVE YOURSELF!!  Instructions:  Apply ice: Fill a plastic sandwich bag with crushed ice. Cover it with a small towel and apply to injection site. Apply for 15 minutes then remove x 15 minutes. Repeat sequence on day of procedure, until you go to bed. The purpose is to minimize swelling and discomfort after procedure.  Apply heat: Apply heat to procedure site starting the day following the procedure. The purpose is to treat any soreness and discomfort from the procedure.  Food intake: No eating limitations, unless stipulated above.  Nevertheless, if you have had sedation, you may experience some nausea.  In this case, it may be wise to wait at least two hours prior to resuming regular diet.  Physical activities: Keep activities to a minimum for the first 8 hours after the procedure. For the first 24 hours after the procedure, do not drive a motor vehicle,  Operate heavy machinery, power tools, or handle any weapons.  Consider walking with the use of an assistive device or accompanied by an adult for the first 24 hours.  Do not drink alcoholic beverages including beer.  Do not make any important decisions or sign any legal documents. Go home and rest today.  Resume activities tomorrow, as tolerated.  Use caution in moving about as you may experience mild leg weakness.  Use caution in cooking, use of household electrical appliances and climbing steps.  Driving: If you have received any sedation, you are not allowed to drive for 24 hours after your procedure.  Blood thinner: Restart your blood thinner 6 hours after your  procedure. (Only for those taking blood thinners)  Insulin: As soon as you can eat, you may resume your normal dosing schedule. (Only for those taking insulin)  Medications: May resume pre-procedure medications.  Do not take any drugs, other than what has been prescribed to you.  Infection prevention: Keep procedure site clean and dry.  Post-procedure Pain Diary: Extremely important that this be done correctly and accurately. Recorded information will be used to determine the next step in treatment.  Pain evaluated is that of treated area only. Do not include pain from an untreated area.  Complete every hour, on the hour, for the initial 8 hours. Set an alarm to help you do this part accurately.  Do not go to sleep and have it completed later. It will not be accurate.  Follow-up appointment: Keep your follow-up appointment after the procedure. Usually 2-6 weeks after radiofrequency. Bring you pain diary. The information collected will be essential for your long-term care.   Expect:  From numbing medicine (AKA: Local Anesthetics): Numbness or decrease in pain.  Onset: Full effect within 15 minutes of injected.  Duration: It will depend on the type of local anesthetic used. On the average, 1 to 8 hours.   From steroids (when added): Decrease in swelling or inflammation. Once inflammation is improved, relief of the pain will follow.  Onset of benefits: Depends on the amount of swelling present. The more swelling, the longer it will take for the benefits to be seen. In some cases, up to 10 days.  Duration: Steroids will stay in the system x 2 weeks. Duration of benefits will depend on multiple posibilities including persistent irritating factors.  From procedure: Some discomfort is to be expected once the numbing medicine wears off. In the case of radiofrequency procedures, this may last as long as 6 weeks. Additional post-procedure pain medication is provided for this. Discomfort is  minimized if ice and heat are applied as instructed.  Call if:  You experience numbness and weakness that gets worse with time, as opposed to wearing off.  He experience any unusual bleeding, difficulty breathing, or loss of the ability to control your bowel and bladder. (This applies to Spinal procedures only)  You experience any redness, swelling, heat, red streaks, elevated temperature, fever, or any other signs of a possible infection.  Emergency Numbers:  Durning business hours (Monday - Thursday, 8:00 AM - 4:00 PM) (Friday, 9:00 AM - 12:00 Noon): [phone]  After hours: [phone] ____________________________________________________________________________________________

## 2023-09-09 NOTE — Progress Notes (Signed)
Safety precautions to be maintained throughout the outpatient stay will include: orient to surroundings, keep bed in low position, maintain call bell within reach at all times, provide assistance with transfer out of bed and ambulation.  

## 2023-09-09 NOTE — Addendum Note (Signed)
Addended by: Vertis Kelch on: 09/09/2023 03:58 PM   Modules accepted: Orders

## 2023-09-09 NOTE — Progress Notes (Signed)
PROVIDER NOTE: Interpretation of information contained herein should be left to medically-trained personnel. Specific patient instructions are provided elsewhere under "Patient Instructions" section of medical record. This document was created in part using STT-dictation technology, any transcriptional errors that may result from this process are unintentional.  Patient: Maria Spencer Type: Established DOB: Apr 05, 1946 MRN: [id number] PCP: Dana Allan, MD  Service: Procedure DOS: 09/09/2023 Setting: Ambulatory Location: Ambulatory outpatient facility Delivery: Face-to-face Provider: Oswaldo Done, MD Specialty: Interventional Pain Management Specialty designation: 09 Location: Outpatient facility Ref. Prov.: Dana Allan, MD       Interventional Therapy   Procedure: Cervical Facet, Medial Branch Radiofrequency Ablation (RFA)  #1  Laterality: Left (-LT)  Level: C3, C4, C5, C6, and C7 Medial Branch Level(s). Lesioning of these levels should completely denervate the C3-4, C4-5, and C5-6 cervical facet joints.  Imaging: Fluoroscopy-guided Spinal ([id number]) Anesthesia: Local anesthesia (1-2% Lidocaine) Anxiolysis: IV Versed 2.0 mg Sedation: Moderate Sedation Fentanyl 0.5 mL (25 mcg) DOS: 09/09/2023  Performed by: Oswaldo Done, MD  Purpose: Therapeutic/Palliative Indications: Chronic neck pain (cervicalgia) severe enough to impact quality of life or function. Rationale (medical necessity): procedure needed and proper for the diagnosis and/or treatment of Maria Spencer's medical symptoms and needs. Indications: 1. Cervicalgia   2. Cervical facet joint pain   3. Cervical facet syndrome (Bilateral)   4. Cervicogenic headache (Bilateral)   5. DDD (degenerative disc disease), cervical   6. Spondylosis without myelopathy or radiculopathy, cervical region   7. Chronic shoulder pain (Bilateral) (R>L)    Maria Spencer has been dealing with the above chronic pain for longer than three  months and has either failed to respond, was unable to tolerate, or simply did not get enough benefit from other more conservative therapies including, but not limited to: 1. Over-the-counter medications 2. Anti-inflammatory medications 3. Muscle relaxants 4. Membrane stabilizers 5. Opioids 6. Physical therapy and/or chiropractic manipulation 7. Modalities (Heat, ice, etc.) 8. Invasive techniques such as nerve blocks. Maria Spencer has attained more than 50% relief of the pain from a series of diagnostic injections conducted in separate occasions.  Pain Score: Pre-procedure: 5 /10 Post-procedure: 0-No pain/10    Target: Cervical medial nerve  Location: Posterolateral aspect of the waist of the cervical articular pillar. Region: Cervical  Approach: Paramedial  Type of procedure: Radiofrequency Neurolytic Ablation   Position / Prep / Materials:  Position: Prone with head of the table was raised to facilitate breathing.  Prep solution: DuraPrep (Iodine Povacrylex [0.7% available iodine] and Isopropyl Alcohol, 74% w/w) Prep Area: Entire Posterior Cervical Region  Materials:  Tray:  RFA (Radiofrequency) tray Needle(s):  Type: RFA (Teflon-coated radiofrequency ablation needles)  Gauge (G): 22  Length: Short (5cm)  Qty: 5     Post-procedure evaluation   Procedure: Cervical Facet, Medial Branch Radiofrequency Ablation (RFA)  #1  Laterality: Right (-RT)  Level: C3, C4, C5, C6, and C7 Medial Branch Level(s). Lesioning of these levels should completely denervate the C3-4, C4-5, and C5-6 cervical facet joints.  Imaging: Fluoroscopy-guided Spinal ([id number]) Anesthesia: Local anesthesia (1-2% Lidocaine) Anxiolysis: IV Versed 2.0 mg Sedation: Moderate Sedation Fentanyl 0.5 mL (25 mcg) DOS: 08/05/2023  Performed by: Oswaldo Done, MD  Purpose: Therapeutic/Palliative Indications: Chronic neck pain (cervicalgia) severe enough to impact quality of life or function. Rationale (medical  necessity): procedure needed and proper for the diagnosis and/or treatment of Maria Spencer's medical symptoms and needs.  Pain Score: Pre-procedure: 6 /10 Post-procedure: 0-No pain/10     Effectiveness:  Initial hour  after procedure: 50 %. Subsequent 4-6 hours post-procedure: 50 %. Analgesia past initial 6 hours: 50 %. Ongoing improvement:  Analgesic:  50% improvement at week 4. Function: Maria Spencer reports improvement in function ROM: Maria Spencer reports improvement in ROM  H&P (Pre-op Assessment):  Maria Spencer is a 77 y.o. (year old), female patient, seen today for interventional treatment. She  has a past surgical history that includes Total shoulder replacement (Right, 2012); Shoulder surgery (Left, 2008, 2009); Tonsillectomy; Bone graft hip iliac crest (2018); Spine surgery (10/23/2017); Ventral hernia repair (N/A, 05/29/2018); Insertion of mesh (N/A, 05/29/2018); Cardiac catheterization (2001); Breast lumpectomy (Right, 2009); Breast biopsy (Left, 2009); Breast biopsy (Right, 2009); Colonoscopy with propofol (N/A, 05/15/2020); Spinal fusion (07/24/2020); Cataract extraction, bilateral (Bilateral, 10/2019); Tubal ligation; Quadriceps tendon repair (Right, 12/05/2020); right gluteus medius/minimus repair Dr. Joice Lofts 11/2020 ; Total knee arthroplasty (Left, 03/18/2022); Hardware Removal (N/A, 03/12/2023); and Colon surgery (03/2023). Maria Spencer has a current medication list which includes the following prescription(s): atorvastatin, calcium carb-cholecalciferol, cholecalciferol, d-mannose, hydrocodone-acetaminophen, [START ON 09/16/2023] hydrocodone-acetaminophen, levothyroxine, melatonin, metoprolol succinate, multi-vitamins, and telmisartan, and the following Facility-Administered Medications: fentanyl. Her primarily concern today is the Neck Pain  Initial Vital Signs:  Pulse/HCG Rate: 77ECG Heart Rate: 80 Temp: (!) 97 F (36.1 C) Resp: 16 BP: (!) 152/73 SpO2: 100 %  BMI: Estimated body mass  index is 26.66 kg/m as calculated from the following:   Height as of this encounter: 4\' 11"  (1.499 m).   Weight as of this encounter: 132 lb (59.9 kg).  Risk Assessment: Allergies: Reviewed. She is allergic to sulfa antibiotics, nitrofuran derivatives, and amlodipine besylate.  Allergy Precautions: None required Coagulopathies: Reviewed. None identified.  Blood-thinner therapy: None at this time Active Infection(s): Reviewed. None identified. Ms. Lappen is afebrile  Site Confirmation: Ms. Stormont was asked to confirm the procedure and laterality before marking the site Procedure checklist: Completed Consent: Before the procedure and under the influence of no sedative(s), amnesic(s), or anxiolytics, the patient was informed of the treatment options, risks and possible complications. To fulfill our ethical and legal obligations, as recommended by the American Medical Association's Code of Ethics, I have informed the patient of my clinical impression; the nature and purpose of the treatment or procedure; the risks, benefits, and possible complications of the intervention; the alternatives, including doing nothing; the risk(s) and benefit(s) of the alternative treatment(s) or procedure(s); and the risk(s) and benefit(s) of doing nothing. The patient was provided information about the general risks and possible complications associated with the procedure. These may include, but are not limited to: failure to achieve desired goals, infection, bleeding, organ or nerve damage, allergic reactions, paralysis, and death. In addition, the patient was informed of those risks and complications associated to Spine-related procedures, such as failure to decrease pain; infection (i.e.: Meningitis, epidural or intraspinal abscess); bleeding (i.e.: epidural hematoma, subarachnoid hemorrhage, or any other type of intraspinal or peri-dural bleeding); organ or nerve damage (i.e.: Any type of peripheral nerve, nerve root, or  spinal cord injury) with subsequent damage to sensory, motor, and/or autonomic systems, resulting in permanent pain, numbness, and/or weakness of one or several areas of the body; allergic reactions; (i.e.: anaphylactic reaction); and/or death. Furthermore, the patient was informed of those risks and complications associated with the medications. These include, but are not limited to: allergic reactions (i.e.: anaphylactic or anaphylactoid reaction(s)); adrenal axis suppression; blood sugar elevation that in diabetics may result in ketoacidosis or comma; water retention that in patients with history of congestive heart failure  may result in shortness of breath, pulmonary edema, and decompensation with resultant heart failure; weight gain; swelling or edema; medication-induced neural toxicity; particulate matter embolism and blood vessel occlusion with resultant organ, and/or nervous system infarction; and/or aseptic necrosis of one or more joints. Finally, the patient was informed that Medicine is not an exact science; therefore, there is also the possibility of unforeseen or unpredictable risks and/or possible complications that may result in a catastrophic outcome. The patient indicated having understood very clearly. We have given the patient no guarantees and we have made no promises. Enough time was given to the patient to ask questions, all of which were answered to the patient's satisfaction. Ms. Langer has indicated that she wanted to continue with the procedure. Attestation: I, the ordering provider, attest that I have discussed with the patient the benefits, risks, side-effects, alternatives, likelihood of achieving goals, and potential problems during recovery for the procedure that I have provided informed consent. Date  Time: 09/09/2023  8:52 AM   Pre-Procedure Preparation:  Monitoring: As per clinic protocol. Respiration, ETCO2, SpO2, BP, heart rate and rhythm monitor placed and checked for  adequate function Safety Precautions: Patient was assessed for positional comfort and pressure points before starting the procedure. Time-out: I initiated and conducted the "Time-out" before starting the procedure, as per protocol. The patient was asked to participate by confirming the accuracy of the "Time Out" information. Verification of the correct person, site, and procedure were performed and confirmed by me, the nursing staff, and the patient. "Time-out" conducted as per Joint Commission's Universal Protocol (UP.01.01.01). Time: 0929 Start Time: 0929 hrs.  Description/Narrative of Procedure:          Start Time: 0929 hrs. Rationale (medical necessity): procedure needed and proper for the diagnosis and/or treatment of the patient's medical symptoms and needs. Procedural Technique Safety Precautions: Aspiration looking for blood return was conducted prior to all injections. At no point did we inject any substances, as a needle was being advanced. No attempts were made at seeking any paresthesias. Safe injection practices and needle disposal techniques used. Medications properly checked for expiration dates. SDV (single dose vial) medications used. Description of the Procedure: Protocol guidelines were followed. The patient was assisted into a comfortable position. The target area was identified and the area prepped in the usual manner. Skin & deeper tissues infiltrated with local anesthetic. Appropriate amount of time allowed to pass for local anesthetics to take effect. The procedure needles were then advanced to the target area. Proper needle placement secured. Negative aspiration confirmed. Solution injected in intermittent fashion, asking for systemic symptoms every 0.5cc of injectate. The needles were then removed and the area cleansed, making sure to leave some of the prepping solution back to take advantage of its long term bactericidal properties.  Technical description of procedure:   Laterality: See above. Radiofrequency Ablation (RFA) C3 Medial Branch Nerve RFA: The target area for the C3 dorsal medial articular branch is the lateral concave waist of the articular pillar of C3. Under fluoroscopic guidance, a Radiofrequency needle was inserted until contact was made with os over the postero-lateral aspect of the articular pillar of C3 (target area). Sensory and motor testing was conducted to properly adjust the position of the needle. Once satisfactory placement of the needle was achieved, the numbing solution was slowly injected after negative aspiration for blood. 2.0 mL of the nerve block solution was injected without difficulty or complication. After waiting for at least 3 minutes, the ablation was performed. Once  completed, the needle was removed intact. C4 Medial Branch Nerve RFA: The target area for the C4 dorsal medial articular branch is the lateral concave waist of the articular pillar of C4. Under fluoroscopic guidance, a Radiofrequency needle was inserted until contact was made with os over the postero-lateral aspect of the articular pillar of C4 (target area). Sensory and motor testing was conducted to properly adjust the position of the needle. Once satisfactory placement of the needle was achieved, the numbing solution was slowly injected after negative aspiration for blood. 2.0 mL of the nerve block solution was injected without difficulty or complication. After waiting for at least 3 minutes, the ablation was performed. Once completed, the needle was removed intact. C5 Medial Branch Nerve RFA: The target area for the C5 dorsal medial articular branch is the lateral concave waist of the articular pillar of C5. Under fluoroscopic guidance, a Radiofrequency needle was inserted until contact was made with os over the postero-lateral aspect of the articular pillar of C5 (target area). Sensory and motor testing was conducted to properly adjust the position of the needle. Once  satisfactory placement of the needle was achieved, the numbing solution was slowly injected after negative aspiration for blood. 2.0 mL of the nerve block solution was injected without difficulty or complication. After waiting for at least 3 minutes, the ablation was performed. Once completed, the needle was removed intact. C6 Medial Branch Nerve RFA: The target area for the C6 dorsal medial articular branch is the lateral concave waist of the articular pillar of C6. Under fluoroscopic guidance, a Radiofrequency needle was inserted until contact was made with os over the postero-lateral aspect of the articular pillar of C6 (target area). Sensory and motor testing was conducted to properly adjust the position of the needle. Once satisfactory placement of the needle was achieved, the numbing solution was slowly injected after negative aspiration for blood. 2.0 mL of the nerve block solution was injected without difficulty or complication. After waiting for at least 3 minutes, the ablation was performed. Once completed, the needle was removed intact. C7 Medial Branch Nerve RFA: The target for the C7 dorsal medial articular branch lies on the superior-medial tip of the C7 transverse process. Under fluoroscopic guidance, a Radiofrequency needle was inserted until contact was made with os over the postero-lateral aspect of the articular pillar of C7 (target area). Sensory and motor testing was conducted to properly adjust the position of the needle. Once satisfactory placement of the needle was achieved, the numbing solution was slowly injected after negative aspiration for blood. 2.0 mL of the nerve block solution was injected without difficulty or complication. After waiting for at least 3 minutes, the ablation was performed. Once completed, the needle was removed intact. Radiofrequency lesioning (ablation):  Radiofrequency Generator: Medtronic AccurianTM AG 1000 RF Generator Sensory Stimulation Parameters: 50 Hz  was used to locate & identify the nerve, making sure that the needle was positioned such that there was no sensory stimulation below 0.3 V or above 0.7 V. Motor Stimulation Parameters: 2 Hz was used to evaluate the motor component. Care was taken not to lesion any nerves that demonstrated motor stimulation of the lower extremities at an output of less than 2.5 times that of the sensory threshold, or a maximum of 2.0 V. Lesioning Technique Parameters: Standard Radiofrequency settings. (Not bipolar or pulsed.) Temperature Settings: 80 degrees C Lesioning time: 60 seconds Stationary intra-operative compliance: Compliant   Once the entire procedure was completed, the treated area was cleaned, making  sure to leave some of the prepping solution back to take advantage of its long term bactericidal properties. Stationary intra-operative compliance: Compliant  Anatomy Reference Guide:         Facet Joint Innervation  C1-2 Third occipital Nerve (TON)  C2-3 TON, C3  Medial Branch  C3-4 C3, C4         "          "  C4-5 C4, C5         "          "  C5-6 C5, C6         "          "  C6-7 C6, C7         "          "  C7-T1 C7, C8         "          "   Cervical Facet Pain Pattern overlap:   Vitals:   09/09/23 1005 09/09/23 1010 09/09/23 1020 09/09/23 1030  BP: (!) 155/91 95/60 (!) 148/85 (!) 140/73  Pulse:      Resp: (!) 22 13 20 20   Temp:  97.9 F (36.6 C)  97.6 F (36.4 C)  SpO2: 100% 100% 98% 97%  Weight:      Height:         End Time: 1005 hrs.  Imaging Guidance (Spinal):          Type of Imaging Technique: Fluoroscopy Guidance (Spinal) Indication(s): Assistance in needle guidance and placement for procedures requiring needle placement in or near specific anatomical locations not easily accessible without such assistance. Exposure Time: Please see nurses notes. Contrast: None used. Fluoroscopic Guidance: I was personally present during the use of fluoroscopy. "Tunnel Vision  Technique" used to obtain the best possible view of the target area. Parallax error corrected before commencing the procedure. "Direction-depth-direction" technique used to introduce the needle under continuous pulsed fluoroscopy. Once target was reached, antero-posterior, oblique, and lateral fluoroscopic projection used confirm needle placement in all planes. Images permanently stored in EMR. Interpretation: No contrast injected. I personally interpreted the imaging intraoperatively. Adequate needle placement confirmed in multiple planes. Permanent images saved into the patient's record.  Post-operative Assessment:  Post-procedure Vital Signs:  Pulse/HCG Rate: 7772 Temp: 97.6 F (36.4 C) Resp: 20 BP: (!) 140/73 SpO2: 97 %  EBL: None  Complications: No immediate post-treatment complications observed by team, or reported by patient.  Note: The patient tolerated the entire procedure well. A repeat set of vitals were taken after the procedure and the patient was kept under observation following institutional policy, for this type of procedure. Post-procedural neurological assessment was performed, showing return to baseline, prior to discharge. The patient was provided with post-procedure discharge instructions, including a section on how to identify potential problems. Should any problems arise concerning this procedure, the patient was given instructions to immediately contact us, at any time, without hesitation. In any case, we plan to contact the patient by telephone for a follow-up status report regarding this interventional procedure.  Comments:  No additional relevant information.  Plan of Care (POC)  Orders:  Orders Placed This Encounter  Procedures   Radiofrequency,Cervical    Scheduling Instructions:     Side(s): Left-sided     Level(s): C3, C4, C5, and C6 Medial Branch Nerves     Sedation: With Sedation.     Timeframe: Today    Order Specific Question:  Where will this  procedure be performed?    Answer:   [facility]   DG [facility] C-ARM 1-60 MIN NO REPORT    Intraoperative interpretation by procedural physician at [facility].    Standing Status:   Standing    Number of Occurrences:   1    Order Specific Question:   Reason for exam:    Answer:   Assistance in needle guidance and placement for procedures requiring needle placement in or near specific anatomical locations not easily accessible without such assistance.   Informed Consent Details: Physician/Practitioner Attestation; Transcribe to consent form and obtain patient signature    Nursing Order: Transcribe to consent form and obtain patient signature. Note: Always confirm laterality of pain with Ms. Ladage, before procedure.    Order Specific Question:   Physician/Practitioner attestation of informed consent for procedure/surgical case    Answer:   I, the physician/practitioner, attest that I have discussed with the patient the benefits, risks, side effects, alternatives, likelihood of achieving goals and potential problems during recovery for the procedure that I have provided informed consent.    Order Specific Question:   Procedure    Answer:   Cervical Facet Radiofrequency Ablation    Order Specific Question:   Physician/Practitioner performing the procedure    Answer:   Jalaiyah Throgmorton A. Laban Emperor, MD    Order Specific Question:   Indication/Reason    Answer:   Neck Pain (Cervicalgia), with our without referred arm pain, due to Facet Joint Arthralgia (Joint Pain) known as Cervical Facet Syndrome, secondary to Cervical, and/or Cervico-thoracic Spondylosis (Arthritis of the Spine), without myelopathy or radiculop   Provide equipment / supplies at bedside    Procedure tray: "Radiofrequency Tray" Additional material: Large hemostat (x1); Small hemostat (x1); Towels (x8); 4x4 sterile sponge pack (x1) Needle type: Teflon-coated Radiofrequency Needle (Disposable  single use) Size:  Short Quantity: 5    Standing Status:   Standing    Number of Occurrences:   1    Order Specific Question:   Specify    Answer:   Radiofrequency Tray   Nursing Instructions:    Please complete this patient's postprocedure evaluation.    Scheduling Instructions:     Please complete this patient's postprocedure evaluation.   Chronic Opioid Analgesic:  No opioid analgesics prescribed by our practice. Highest recorded MME/day: 75 mg/day MME/day: 0 mg/day   Medications ordered for procedure: Meds ordered this encounter  Medications   lidocaine (XYLOCAINE) 2 % (with pres) injection 400 mg   pentafluoroprop-tetrafluoroeth (GEBAUERS) aerosol   midazolam (VERSED) 5 MG/5ML injection 0.5-2 mg    Make sure Flumazenil is available in the pyxis when using this medication. If oversedation occurs, administer 0.2 mg IV over 15 sec. If after 45 sec no response, administer 0.2 mg again over 1 min; may repeat at 1 min intervals; not to exceed 4 doses (1 mg)   fentaNYL (SUBLIMAZE) injection 25-50 mcg    Make sure Narcan is available in the pyxis when using this medication. In the event of respiratory depression (RR< 8/min): Titrate NARCAN (naloxone) in increments of 0.1 to 0.2 mg IV at 2-3 minute intervals, until desired degree of reversal.   ropivacaine (PF) 2 mg/mL (0.2%) (NAROPIN) injection 9 mL   dexamethasone (DECADRON) injection 10 mg   HYDROcodone-acetaminophen (NORCO/VICODIN) 5-325 MG tablet    Sig: Take 1 tablet by mouth every 6 (six) hours as needed for up to 7 days for severe pain (pain score 7-10). Must last  7 days.    Dispense:  28 tablet    Refill:  0    For acute post-operative pain. Not to be refilled. Must last 7 days.   HYDROcodone-acetaminophen (NORCO/VICODIN) 5-325 MG tablet    Sig: Take 1 tablet by mouth every 6 (six) hours as needed for up to 7 days for severe pain (pain score 7-10). Must last 7 days.    Dispense:  28 tablet    Refill:  0    For acute post-operative pain. Not to  be refilled.  Must last 7 days.   lactated ringers infusion   Medications administered: We administered lidocaine, pentafluoroprop-tetrafluoroeth, midazolam, fentaNYL, ropivacaine (PF) 2 mg/mL (0.2%), dexamethasone, and lactated ringers.  See the medical record for exact dosing, route, and time of administration.  Follow-up plan:   Return in about 6 weeks (around 10/21/2023) for (Face2F), (PPE).       Interventional Therapies  Risk Factors  Considerations:   Aortic atherosclerosis  osteoporosis  HTN  GERD  palpitations  prediabetes  SOB  Hx. of breast cancer   Planned  Pending:   Therapeutic right cervical FCT RFA #1   (02/05/2023) TENS ordered   Under consideration:   Diagnostic left inferolateral and recurrent genicular NB #1  Therapeutic left inferolateral and recurrent genicular nerve RFA #1  Diagnostic right T9-10 thoracic ESI #1  Diagnostic bilateral T9, T10, T11, and T12 MMB thoracic facet RFA (DIFICULT CANDIDATE DUE TO HARDWARE)    Completed:   Diagnostic left genicular NB x1 (07/03/2021) (100/100/50/50)  Therapeutic left genicular nerve RFA x1 (08/09/2021) (100/100/30/0)  Diagnostic left cervical facet MBB x1 (10/26/2020) (100/100/0)  Diagnostic right cervical facet MBB x2 (03/25/2023) (100/100/50 x 3 wks/ 0)   Diagnostic bilateral suprascapular NB x1 (10/05/2020) (100/100/100 x1 day/0)  Diagnostic right cervical ESI x1 (09/14/2020) (100/100/100/90-100)  Diagnostic/therapeutic bilateral T9-T12 thoracic facet MBB x2 (06/06/2020) (100/100/0)  Diagnostic/Therapeutic right trapezoid bursa injection x1 (11/09/2020) (did not follow-up)    Therapeutic  Palliative (PRN) options:   Therapeutic/palliative bilateral T9-12 thoracic facet MBB #3  Therapeutic/palliative right trapezoid bursa injection #2       Recent Visits Date Type Provider Dept  08/05/23 Procedure visit Delano Metz, MD [facility]  06/23/23 Office Visit Delano Metz, MD [facility]  Showing recent visits within past 90 days and meeting all other requirements Today's Visits Date Type Provider Dept  09/09/23 Procedure visit Delano Metz, MD [facility]  Showing today's visits and meeting all other requirements Future Appointments No visits were found meeting these conditions. Showing future appointments within next 90 days and meeting all other requirements  Disposition: Discharge home  Discharge (Date  Time): 09/09/2023; 1036 hrs.   Primary Care Physician: Dana Allan, MD Location: [facility] Note by: Oswaldo Done, MD (TTS technology used. I apologize for any typographical errors that were not detected and corrected.) Date: 09/09/2023; Time: 12:45 PM  Disclaimer:  Medicine is not an Visual merchandiser. The only guarantee in medicine is that nothing is guaranteed. It is important to note that the decision to proceed with this intervention was based on the information collected from the patient. The Data and conclusions were drawn from the patient's questionnaire, the interview, and the physical examination. Because the information was provided in large part by the patient, it cannot be guaranteed that it has not been purposely or unconsciously manipulated. Every effort has been made to obtain as much relevant data as possible for this evaluation. It is  important to note that the conclusions that lead to this procedure are derived in large part from the available data. Always take into account that the treatment will also be dependent on availability of resources and existing treatment guidelines, considered by other Pain Management Practitioners as being common knowledge and practice, at the time of the intervention. For Medico-Legal purposes, it is also important to point out that variation in procedural techniques and pharmacological choices are the acceptable norm. The indications, contraindications, technique,  and results of the above procedure should only be interpreted and judged by a Board-Certified Interventional Pain Specialist with extensive familiarity and expertise in the same exact procedure and technique.

## 2023-09-10 ENCOUNTER — Telehealth: Payer: Self-pay

## 2023-09-10 NOTE — Telephone Encounter (Signed)
Post procedure follow up.  Patient states she is doing well.   ?

## 2023-09-15 DIAGNOSIS — J309 Allergic rhinitis, unspecified: Secondary | ICD-10-CM | POA: Diagnosis not present

## 2023-09-15 DIAGNOSIS — J301 Allergic rhinitis due to pollen: Secondary | ICD-10-CM | POA: Diagnosis not present

## 2023-09-15 DIAGNOSIS — R0981 Nasal congestion: Secondary | ICD-10-CM | POA: Diagnosis not present

## 2023-09-15 DIAGNOSIS — B379 Candidiasis, unspecified: Secondary | ICD-10-CM | POA: Diagnosis not present

## 2023-09-15 DIAGNOSIS — J329 Chronic sinusitis, unspecified: Secondary | ICD-10-CM | POA: Diagnosis not present

## 2023-09-17 DIAGNOSIS — J329 Chronic sinusitis, unspecified: Secondary | ICD-10-CM | POA: Diagnosis not present

## 2023-09-22 DIAGNOSIS — M1711 Unilateral primary osteoarthritis, right knee: Secondary | ICD-10-CM | POA: Diagnosis not present

## 2023-09-22 DIAGNOSIS — Z0189 Encounter for other specified special examinations: Secondary | ICD-10-CM | POA: Diagnosis not present

## 2023-10-09 DIAGNOSIS — M810 Age-related osteoporosis without current pathological fracture: Secondary | ICD-10-CM | POA: Diagnosis not present

## 2023-10-16 DIAGNOSIS — M810 Age-related osteoporosis without current pathological fracture: Secondary | ICD-10-CM | POA: Diagnosis not present

## 2023-10-27 DIAGNOSIS — M1711 Unilateral primary osteoarthritis, right knee: Secondary | ICD-10-CM | POA: Diagnosis not present

## 2023-10-27 DIAGNOSIS — M179 Osteoarthritis of knee, unspecified: Secondary | ICD-10-CM | POA: Diagnosis not present

## 2023-10-27 DIAGNOSIS — M25551 Pain in right hip: Secondary | ICD-10-CM | POA: Diagnosis not present

## 2023-10-27 DIAGNOSIS — M25552 Pain in left hip: Secondary | ICD-10-CM | POA: Diagnosis not present

## 2023-10-28 DIAGNOSIS — M1711 Unilateral primary osteoarthritis, right knee: Secondary | ICD-10-CM | POA: Insufficient documentation

## 2023-11-07 DIAGNOSIS — Z0189 Encounter for other specified special examinations: Secondary | ICD-10-CM | POA: Diagnosis not present

## 2023-11-12 DIAGNOSIS — Z96651 Presence of right artificial knee joint: Secondary | ICD-10-CM | POA: Insufficient documentation

## 2023-12-10 DIAGNOSIS — M810 Age-related osteoporosis without current pathological fracture: Secondary | ICD-10-CM | POA: Diagnosis not present

## 2023-12-18 DIAGNOSIS — Z96652 Presence of left artificial knee joint: Secondary | ICD-10-CM | POA: Diagnosis not present

## 2023-12-18 DIAGNOSIS — Z7982 Long term (current) use of aspirin: Secondary | ICD-10-CM | POA: Diagnosis not present

## 2023-12-18 DIAGNOSIS — M1711 Unilateral primary osteoarthritis, right knee: Secondary | ICD-10-CM | POA: Diagnosis not present

## 2023-12-18 DIAGNOSIS — Z853 Personal history of malignant neoplasm of breast: Secondary | ICD-10-CM | POA: Diagnosis not present

## 2023-12-18 DIAGNOSIS — G8918 Other acute postprocedural pain: Secondary | ICD-10-CM | POA: Diagnosis not present

## 2023-12-19 DIAGNOSIS — M1711 Unilateral primary osteoarthritis, right knee: Secondary | ICD-10-CM | POA: Diagnosis not present

## 2023-12-19 DIAGNOSIS — Z853 Personal history of malignant neoplasm of breast: Secondary | ICD-10-CM | POA: Diagnosis not present

## 2023-12-19 DIAGNOSIS — Z7982 Long term (current) use of aspirin: Secondary | ICD-10-CM | POA: Diagnosis not present

## 2023-12-19 DIAGNOSIS — Z96652 Presence of left artificial knee joint: Secondary | ICD-10-CM | POA: Diagnosis not present

## 2023-12-20 DIAGNOSIS — Z604 Social exclusion and rejection: Secondary | ICD-10-CM | POA: Diagnosis not present

## 2023-12-20 DIAGNOSIS — Z471 Aftercare following joint replacement surgery: Secondary | ICD-10-CM | POA: Diagnosis not present

## 2023-12-20 DIAGNOSIS — M810 Age-related osteoporosis without current pathological fracture: Secondary | ICD-10-CM | POA: Diagnosis not present

## 2023-12-20 DIAGNOSIS — Z7982 Long term (current) use of aspirin: Secondary | ICD-10-CM | POA: Diagnosis not present

## 2023-12-20 DIAGNOSIS — Z981 Arthrodesis status: Secondary | ICD-10-CM | POA: Diagnosis not present

## 2023-12-20 DIAGNOSIS — Z96653 Presence of artificial knee joint, bilateral: Secondary | ICD-10-CM | POA: Diagnosis not present

## 2023-12-20 DIAGNOSIS — M48061 Spinal stenosis, lumbar region without neurogenic claudication: Secondary | ICD-10-CM | POA: Diagnosis not present

## 2023-12-20 DIAGNOSIS — G629 Polyneuropathy, unspecified: Secondary | ICD-10-CM | POA: Diagnosis not present

## 2023-12-20 DIAGNOSIS — I10 Essential (primary) hypertension: Secondary | ICD-10-CM | POA: Diagnosis not present

## 2023-12-22 DIAGNOSIS — Z981 Arthrodesis status: Secondary | ICD-10-CM | POA: Diagnosis not present

## 2023-12-22 DIAGNOSIS — G629 Polyneuropathy, unspecified: Secondary | ICD-10-CM | POA: Diagnosis not present

## 2023-12-22 DIAGNOSIS — Z96653 Presence of artificial knee joint, bilateral: Secondary | ICD-10-CM | POA: Diagnosis not present

## 2023-12-22 DIAGNOSIS — M48061 Spinal stenosis, lumbar region without neurogenic claudication: Secondary | ICD-10-CM | POA: Diagnosis not present

## 2023-12-22 DIAGNOSIS — I10 Essential (primary) hypertension: Secondary | ICD-10-CM | POA: Diagnosis not present

## 2023-12-22 DIAGNOSIS — Z471 Aftercare following joint replacement surgery: Secondary | ICD-10-CM | POA: Diagnosis not present

## 2023-12-24 DIAGNOSIS — I10 Essential (primary) hypertension: Secondary | ICD-10-CM | POA: Diagnosis not present

## 2023-12-24 DIAGNOSIS — M48061 Spinal stenosis, lumbar region without neurogenic claudication: Secondary | ICD-10-CM | POA: Diagnosis not present

## 2023-12-24 DIAGNOSIS — Z471 Aftercare following joint replacement surgery: Secondary | ICD-10-CM | POA: Diagnosis not present

## 2023-12-24 DIAGNOSIS — Z96653 Presence of artificial knee joint, bilateral: Secondary | ICD-10-CM | POA: Diagnosis not present

## 2023-12-24 DIAGNOSIS — Z981 Arthrodesis status: Secondary | ICD-10-CM | POA: Diagnosis not present

## 2023-12-24 DIAGNOSIS — G629 Polyneuropathy, unspecified: Secondary | ICD-10-CM | POA: Diagnosis not present

## 2023-12-26 DIAGNOSIS — Z981 Arthrodesis status: Secondary | ICD-10-CM | POA: Diagnosis not present

## 2023-12-26 DIAGNOSIS — M48061 Spinal stenosis, lumbar region without neurogenic claudication: Secondary | ICD-10-CM | POA: Diagnosis not present

## 2023-12-26 DIAGNOSIS — I10 Essential (primary) hypertension: Secondary | ICD-10-CM | POA: Diagnosis not present

## 2023-12-26 DIAGNOSIS — Z96653 Presence of artificial knee joint, bilateral: Secondary | ICD-10-CM | POA: Diagnosis not present

## 2023-12-26 DIAGNOSIS — G629 Polyneuropathy, unspecified: Secondary | ICD-10-CM | POA: Diagnosis not present

## 2023-12-26 DIAGNOSIS — Z471 Aftercare following joint replacement surgery: Secondary | ICD-10-CM | POA: Diagnosis not present

## 2023-12-29 DIAGNOSIS — G629 Polyneuropathy, unspecified: Secondary | ICD-10-CM | POA: Diagnosis not present

## 2023-12-29 DIAGNOSIS — Z471 Aftercare following joint replacement surgery: Secondary | ICD-10-CM | POA: Diagnosis not present

## 2023-12-29 DIAGNOSIS — I10 Essential (primary) hypertension: Secondary | ICD-10-CM | POA: Diagnosis not present

## 2023-12-29 DIAGNOSIS — M48061 Spinal stenosis, lumbar region without neurogenic claudication: Secondary | ICD-10-CM | POA: Diagnosis not present

## 2023-12-29 DIAGNOSIS — Z981 Arthrodesis status: Secondary | ICD-10-CM | POA: Diagnosis not present

## 2023-12-29 DIAGNOSIS — Z96653 Presence of artificial knee joint, bilateral: Secondary | ICD-10-CM | POA: Diagnosis not present

## 2024-01-01 DIAGNOSIS — Z96653 Presence of artificial knee joint, bilateral: Secondary | ICD-10-CM | POA: Diagnosis not present

## 2024-01-01 DIAGNOSIS — Z981 Arthrodesis status: Secondary | ICD-10-CM | POA: Diagnosis not present

## 2024-01-01 DIAGNOSIS — Z471 Aftercare following joint replacement surgery: Secondary | ICD-10-CM | POA: Diagnosis not present

## 2024-01-01 DIAGNOSIS — I10 Essential (primary) hypertension: Secondary | ICD-10-CM | POA: Diagnosis not present

## 2024-01-01 DIAGNOSIS — M48061 Spinal stenosis, lumbar region without neurogenic claudication: Secondary | ICD-10-CM | POA: Diagnosis not present

## 2024-01-01 DIAGNOSIS — G629 Polyneuropathy, unspecified: Secondary | ICD-10-CM | POA: Diagnosis not present

## 2024-01-06 DIAGNOSIS — I10 Essential (primary) hypertension: Secondary | ICD-10-CM | POA: Diagnosis not present

## 2024-01-06 DIAGNOSIS — Z96653 Presence of artificial knee joint, bilateral: Secondary | ICD-10-CM | POA: Diagnosis not present

## 2024-01-06 DIAGNOSIS — G629 Polyneuropathy, unspecified: Secondary | ICD-10-CM | POA: Diagnosis not present

## 2024-01-06 DIAGNOSIS — Z471 Aftercare following joint replacement surgery: Secondary | ICD-10-CM | POA: Diagnosis not present

## 2024-01-06 DIAGNOSIS — Z981 Arthrodesis status: Secondary | ICD-10-CM | POA: Diagnosis not present

## 2024-01-06 DIAGNOSIS — M48061 Spinal stenosis, lumbar region without neurogenic claudication: Secondary | ICD-10-CM | POA: Diagnosis not present

## 2024-01-08 DIAGNOSIS — M48061 Spinal stenosis, lumbar region without neurogenic claudication: Secondary | ICD-10-CM | POA: Diagnosis not present

## 2024-01-08 DIAGNOSIS — G629 Polyneuropathy, unspecified: Secondary | ICD-10-CM | POA: Diagnosis not present

## 2024-01-08 DIAGNOSIS — Z96653 Presence of artificial knee joint, bilateral: Secondary | ICD-10-CM | POA: Diagnosis not present

## 2024-01-08 DIAGNOSIS — I10 Essential (primary) hypertension: Secondary | ICD-10-CM | POA: Diagnosis not present

## 2024-01-08 DIAGNOSIS — Z981 Arthrodesis status: Secondary | ICD-10-CM | POA: Diagnosis not present

## 2024-01-08 DIAGNOSIS — Z471 Aftercare following joint replacement surgery: Secondary | ICD-10-CM | POA: Diagnosis not present

## 2024-01-14 DIAGNOSIS — Z981 Arthrodesis status: Secondary | ICD-10-CM | POA: Diagnosis not present

## 2024-01-14 DIAGNOSIS — I10 Essential (primary) hypertension: Secondary | ICD-10-CM | POA: Diagnosis not present

## 2024-01-14 DIAGNOSIS — Z96653 Presence of artificial knee joint, bilateral: Secondary | ICD-10-CM | POA: Diagnosis not present

## 2024-01-14 DIAGNOSIS — G629 Polyneuropathy, unspecified: Secondary | ICD-10-CM | POA: Diagnosis not present

## 2024-01-14 DIAGNOSIS — M48061 Spinal stenosis, lumbar region without neurogenic claudication: Secondary | ICD-10-CM | POA: Diagnosis not present

## 2024-01-14 DIAGNOSIS — Z471 Aftercare following joint replacement surgery: Secondary | ICD-10-CM | POA: Diagnosis not present

## 2024-01-27 DIAGNOSIS — M25561 Pain in right knee: Secondary | ICD-10-CM | POA: Diagnosis not present

## 2024-01-28 DIAGNOSIS — M2170 Unequal limb length (acquired), unspecified site: Secondary | ICD-10-CM | POA: Diagnosis not present

## 2024-01-28 DIAGNOSIS — Z96651 Presence of right artificial knee joint: Secondary | ICD-10-CM | POA: Diagnosis not present

## 2024-02-02 DIAGNOSIS — M25561 Pain in right knee: Secondary | ICD-10-CM | POA: Diagnosis not present

## 2024-02-05 DIAGNOSIS — M25561 Pain in right knee: Secondary | ICD-10-CM | POA: Diagnosis not present

## 2024-02-11 DIAGNOSIS — M25561 Pain in right knee: Secondary | ICD-10-CM | POA: Diagnosis not present

## 2024-03-26 ENCOUNTER — Telehealth: Payer: Self-pay | Admitting: Gastroenterology

## 2024-03-26 NOTE — Telephone Encounter (Signed)
 The patient called to schedule an appointment due to experiencing painful bowel movements, excessive gas, and reporting that everything she eats passes through immediately. She mentioned having colon surgery last year.  An appointment was scheduled with Marcille Severance on April 30, 2024, at 10:40 AM.

## 2024-03-31 ENCOUNTER — Ambulatory Visit: Payer: Medicare Other | Attending: Internal Medicine | Admitting: Internal Medicine

## 2024-03-31 ENCOUNTER — Encounter: Payer: Self-pay | Admitting: Internal Medicine

## 2024-03-31 VITALS — BP 130/72 | HR 65 | Ht 59.0 in | Wt 139.8 lb

## 2024-03-31 DIAGNOSIS — R0602 Shortness of breath: Secondary | ICD-10-CM | POA: Diagnosis not present

## 2024-03-31 DIAGNOSIS — I10 Essential (primary) hypertension: Secondary | ICD-10-CM | POA: Diagnosis not present

## 2024-03-31 DIAGNOSIS — R079 Chest pain, unspecified: Secondary | ICD-10-CM | POA: Insufficient documentation

## 2024-03-31 DIAGNOSIS — R5383 Other fatigue: Secondary | ICD-10-CM | POA: Insufficient documentation

## 2024-03-31 DIAGNOSIS — R002 Palpitations: Secondary | ICD-10-CM | POA: Diagnosis not present

## 2024-03-31 DIAGNOSIS — R0609 Other forms of dyspnea: Secondary | ICD-10-CM | POA: Diagnosis not present

## 2024-03-31 NOTE — Patient Instructions (Signed)
 Medication Instructions:   Your Physician recommend you continue on your current medication as directed.     *If you need a refill on your cardiac medications before your next appointment, please call your pharmacy*  Lab Work: Your provider would like for you to have following labs drawn today CBC, CMP, BNP and TSH.   If you have labs (blood work) drawn today and your tests are completely normal, you will receive your results only by: MyChart Message (if you have MyChart) OR A paper copy in the mail If you have any lab test that is abnormal or we need to change your treatment, we will call you to review the results.  Testing/Procedures: Your physician has requested that you have an echocardiogram. Echocardiography is a painless test that uses sound waves to create images of your heart. It provides your doctor with information about the size and shape of your heart and how well your heart's chambers and valves are working.   You may receive an ultrasound enhancing agent through an IV if needed to better visualize your heart during the echo. This procedure takes approximately one hour.  There are no restrictions for this procedure.  This will take place at [address] [facility] [address]) [address], [place] [address]  Please note: We ask at that you not bring children with you during ultrasound (echo/ vascular) testing. Due to room size and safety concerns, children are not allowed in the ultrasound rooms during exams. Our front office staff cannot provide observation of children in our lobby area while testing is being conducted. An adult accompanying a patient to their appointment will only be allowed in the ultrasound room at the discretion of the ultrasound technician under special circumstances. We apologize for any inconvenience.   Follow-Up: At [facility], you and your health needs are our priority.  As part of our continuing mission to provide you with exceptional heart  care, our providers are all part of one team.  This team includes your primary Cardiologist (physician) and Advanced Practice Providers or APPs (Physician Assistants and Nurse Practitioners) who all work together to provide you with the care you need, when you need it.  Your next appointment:   Follow up after Echo  Provider:   You may see Sammy Crisp, MD or one of the following Advanced Practice Providers on your designated Care Team:   Laneta Pintos, NP Gildardo Labrador, PA-C Varney Gentleman, PA-C Cadence [place], PA-C Ronald Cockayne, NP Morey Ar, NP    We recommend signing up for the patient portal called "MyChart".  Sign up information is provided on this After Visit Summary.  MyChart is used to connect with patients for Virtual Visits (Telemedicine).  Patients are able to view lab/test results, encounter notes, upcoming appointments, etc.  Non-urgent messages can be sent to your provider as well.   To learn more about what you can do with MyChart, go to [email or web address].

## 2024-03-31 NOTE — Progress Notes (Signed)
 Cardiology Office Note:  .   Date:  03/31/2024  ID:  Maria Spencer, DOB [birth year], MRN [id number] PCP: Maria Gaw, MD  [facility] Providers Cardiologist:  Maria Crisp, MD     History of Present Illness: .   Maria Spencer is a 78 y.o. female with history of hypertension, hyperlipidemia, right breast cancer status post lumpectomy and radiation, hypothyroidism, diverticulitis status post sigmoidectomy, and esophagitis, who presents for follow-up of chest pain, shortness of breath, and hypertension.  She was last seen in our office in 12/2022 by Arlyce Berger hammock, NP, at which time she was feeling okay.  She was most bothered by knee pain.  She also noted fatigue and occasional exertional dyspnea.  She denied further chest pain.  Repeat echocardiogram demonstrated normal LVEF with grade 1 diastolic dysfunction and mild mitral regurgitation.  Due to suboptimal blood pressure control, metoprolol  succinate was increased to 50 mg twice daily.  Today, Ms. Arnzen reports that she has been feeling more short of breath, especially over the last 4 to 5 months.  She wonders if some of this is due to deconditioning, as her mobility remains quite restricted despite undergoing bilateral knee surgery.  She gets short of breath just walking from 1 room to another.  She also has transient shortness of breath and pain in the left side of her chest when she bends over.  She feels like her sternum is also tender at times.  She does not have any exertional chest pain.  She denies palpitation, Hima.  She occasionally feel if she stands up too quickly but has not passed out or fallen.  She denies bleeding.  ROS: See HPI  Studies Reviewed: Aaron Aas   EKG Interpretation Date/Time:  Wednesday Mar 31 2024 11:13:04 EDT Ventricular Rate:  65 PR Interval:  140 QRS Duration:  88 QT Interval:  396 QTC Calculation: 411 R Axis:   -3  Text Interpretation: Normal sinus rhythm Nonspecific ST abnormality Abnormal ECG When  compared with ECG of 11-Mar-2022 09:16, No significant change was found Confirmed by Aryah Doering, Veryl Gottron [phone]) on 03/31/2024 1:57:34 PM    TTE (03/05/2023): Normal LV size and wall thickness.  LVEF 60-65% with normal wall motion and grade 1 diastolic dysfunction.  Normal RV size and function.  Normal biatrial size.  No pericardial effusion.  Structurally normal mitral valve with mild regurgitation.  Mild tricuspid regurgitation.  Normal CVP.  ABIs (07/26/2022): Normal ABIs and TBI's bilaterally.  Risk Assessment/Calculations:             Physical Exam:   VS:  BP 130/72 (BP Location: Left Arm, Patient Position: Sitting, Cuff Size: Normal)   Pulse 65   Ht 4\' 11"  (1.499 m)   Wt 139 lb 12.8 oz (63.4 kg)   SpO2 98%   BMI 28.24 kg/m    Wt Readings from Last 3 Encounters:  03/31/24 139 lb 12.8 oz (63.4 kg)  09/09/23 132 lb (59.9 kg)  08/11/23 135 lb 4 oz (61.3 kg)    General:  NAD. Neck: No JVD or HJR. Lungs: Clear to auscultation bilaterally without wheezes or crackles. Heart: Regular rate and rhythm without murmurs, rubs, or gallops. Abdomen: Soft, nontender, nondistended. Extremities: No lower extremity edema.  ASSESSMENT AND PLAN: .    Dyspnea on exertion and fatigue: This has been a chronic issue for [name] but has progressed per her report even more over the last 4 to 5 months.  She now gets short of breath just walking across the room.  She appears euvolemic.  Echo last year showed normal LVEF with grade 1 diastolic dysfunction and mild mitral regurgitation, which would not explain her degree of dyspnea.  I have recommended that we repeat an echocardiogram and check a CBC, CMP, BNP, and TSH.  If this is reassuring, repeat noninvasive is anemia evaluation will need to be reconsidered (coronary CTA in 2020 showed absence of CAD).  If there is evidence of cardiomyopathy or pulmonary hypertension on echocardiogram, I would favor pursuing cardiac catheterization (likely right and left heart  catheterization) instead.  If aforementioned workup is unrevealing, consultation with pulmonology may be needed as well.  Chest pain: Left-sided chest discomfort present only when bending over.  Ms. Vergel also notes some sternal tenderness.  This is consistent with musculoskeletal etiology.  We will begin with an echocardiogram, as outlined above, and reconsider ischemia evaluation based on results as outlined above.  Hypertension: Blood pressure upper normal today.  No medication changes at this time.    Dispo: Return to clinic after echocardiogram.  Signed, Maria Crisp, MD

## 2024-04-02 ENCOUNTER — Encounter: Payer: Self-pay | Admitting: Internal Medicine

## 2024-04-02 LAB — COMPREHENSIVE METABOLIC PANEL WITH GFR
ALT: 25 IU/L (ref 0–32)
AST: 29 IU/L (ref 0–40)
Albumin: 4.5 g/dL (ref 3.8–4.8)
Alkaline Phosphatase: 113 IU/L (ref 44–121)
BUN/Creatinine Ratio: 33 — ABNORMAL HIGH (ref 12–28)
BUN: 18 mg/dL (ref 8–27)
Bilirubin Total: 0.4 mg/dL (ref 0.0–1.2)
CO2: 22 mmol/L (ref 20–29)
Calcium: 9.7 mg/dL (ref 8.7–10.3)
Chloride: 99 mmol/L (ref 96–106)
Creatinine, Ser: 0.54 mg/dL — ABNORMAL LOW (ref 0.57–1.00)
Globulin, Total: 2.2 g/dL (ref 1.5–4.5)
Glucose: 98 mg/dL (ref 70–99)
Potassium: 4.7 mmol/L (ref 3.5–5.2)
Sodium: 139 mmol/L (ref 134–144)
Total Protein: 6.7 g/dL (ref 6.0–8.5)
eGFR: 95 mL/min/{1.73_m2} (ref >59)

## 2024-04-02 LAB — CBC
Hematocrit: 43.6 % (ref 34.0–46.6)
Hemoglobin: 14.3 g/dL (ref 11.1–15.9)
MCH: 28.9 pg (ref 26.6–33.0)
MCHC: 32.8 g/dL (ref 31.5–35.7)
MCV: 88 fL (ref 79–97)
Platelets: 226 10*3/uL (ref 150–450)
RBC: 4.95 x10E6/uL (ref 3.77–5.28)
RDW: 13.2 % (ref 11.7–15.4)
WBC: 5.3 10*3/uL (ref 3.4–10.8)

## 2024-04-02 LAB — TSH: TSH: 4.12 u[IU]/mL (ref 0.450–4.500)

## 2024-04-02 LAB — BRAIN NATRIURETIC PEPTIDE: BNP: 60.7 pg/mL (ref 0.0–100.0)

## 2024-04-15 ENCOUNTER — Ambulatory Visit (INDEPENDENT_AMBULATORY_CARE_PROVIDER_SITE_OTHER): Admitting: Physician Assistant

## 2024-04-15 VITALS — BP 179/87 | HR 83 | Ht 59.0 in | Wt 138.0 lb

## 2024-04-15 DIAGNOSIS — N390 Urinary tract infection, site not specified: Secondary | ICD-10-CM | POA: Diagnosis not present

## 2024-04-15 DIAGNOSIS — N3020 Other chronic cystitis without hematuria: Secondary | ICD-10-CM | POA: Diagnosis not present

## 2024-04-15 LAB — MICROSCOPIC EXAMINATION: WBC, UA: >30 /HPF — AB (ref 0–5)

## 2024-04-15 LAB — URINALYSIS, COMPLETE
Bilirubin, UA: NEGATIVE
Glucose, UA: NEGATIVE
Ketones, UA: NEGATIVE
Nitrite, UA: POSITIVE — AB
Protein,UA: NEGATIVE
Specific Gravity, UA: 1.015 (ref 1.005–1.030)
Urobilinogen, Ur: 0.2 mg/dL (ref 0.2–1.0)
pH, UA: 7 (ref 5.0–7.5)

## 2024-04-15 MED ORDER — CIPROFLOXACIN HCL 250 MG PO TABS: 250.0000 mg | ORAL_TABLET | Freq: Two times a day (BID) | ORAL | 0 refills | Status: AC

## 2024-04-15 MED ORDER — FLUCONAZOLE 150 MG PO TABS: ORAL_TABLET | ORAL | 0 refills | Status: DC

## 2024-04-15 NOTE — Progress Notes (Signed)
 04/15/2024 10:33 AM   Maria Spencer [birth year] [id number]  CC: Chief Complaint  Patient presents with   Follow-up   HPI: Maria Spencer is a 78 y.o. 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   Today she reports 3 days of frequency, urgency, dysuria, fatigue, nausea, LLQ pain, and left flank pain.  She denies fever, chills, or vomiting.  In-office UA today positive for 2+ blood, nitrites, and 3+ leukocytes; urine microscopy with >30 WBCs/HPF, 11-30 RBCs/HPF, and many bacteria.  PMH: Past Medical History:  Diagnosis Date   Arthritis    neck, back, left knee;    Breast cancer (HCC) 2009   right lumpectomy s/p radiation x 1 week 2x per day only    Chicken pox    Chronic UTI    established with urology    COVID-19    ? 12/2020 sob  took antiviral, 08/15/21   Diverticular disease    -osis and -itis    Diverticulitis    Esophagitis    egd 05/17/14 see report scanned into chart   GERD (gastroesophageal reflux disease)    h/o   History of kidney stones    Hormone disorder    Hyperlipidemia    Hypertension    controlled well with medication;    Hypothyroidism    LVH (left ventricular hypertrophy)    mild   MR (mitral regurgitation)    Osteoporosis    PAC (premature atrial contraction)    Personal history of radiation therapy 2009   F/U right breast cancer   PSVT (paroxysmal supraventricular tachycardia) (HCC)    PVC (premature ventricular contraction)    Sepsis (HCC)    Thyroid  disease    hypothyroidism     Surgical History: Past Surgical History:  Procedure Laterality Date   BONE GRAFT HIP ILIAC CREST  2018   + cage left hip 10/23/17    BREAST BIOPSY Left 2009   clip,benign   BREAST BIOPSY Right 2009   +   BREAST LUMPECTOMY Right 2009   2009 lumpectomy    CARDIAC CATHETERIZATION  2001   No stents;  results were WNL Dr. requested it for HTN   CATARACT  EXTRACTION, BILATERAL Bilateral 10/2019   COLON SURGERY  03/2023   COLONOSCOPY WITH PROPOFOL  N/A 05/15/2020   Procedure: COLONOSCOPY WITH PROPOFOL ;  Surgeon: Selena Daily, MD;  Location: [facility];  Service: Gastroenterology;  Laterality: N/A;   HARDWARE REMOVAL N/A 03/12/2023   Procedure: REMOVAL OF ILIAC SCREWS;  Surgeon: Jodeen Munch, MD;  Location: [facility];  Service: Neurosurgery;  Laterality: N/A;   INSERTION OF MESH N/A 05/29/2018   Procedure: INSERTION OF MESH;  Surgeon: Jacolyn Matar, MD;  Location: [facility];  Service: General;  Laterality: N/A;   QUADRICEPS TENDON REPAIR Right 12/05/2020   Procedure: OPEN REPAIR OF RIGHT GLUTEUS MINIMUS TENDON;  Surgeon: Elner Hahn, MD;  Location: [facility];  Service: Orthopedics;  Laterality: Right;   right gluteus medius/minimus repair Dr. Daun Epstein 11/2020      Dr. Daun Epstein   SHOULDER SURGERY Left 2008, 2009   x2 rotator cuff surgeries left shoulders   SPINAL FUSION  07/24/2020   SPINE SURGERY  10/23/2017   spinal 09/2017 h/o scoliosis [facility] L4-S1 OLIF T10 to ililum fusion   TONSILLECTOMY     age 89 y.o.    TOTAL KNEE ARTHROPLASTY Left 03/18/2022   Procedure: TOTAL KNEE ARTHROPLASTY;  Surgeon: Murleen Arms,  MD;  Location: [facility];  Service: Orthopedics;  Laterality: Left;   TOTAL SHOULDER REPLACEMENT Right 2012   TUBAL LIGATION     age 49   VENTRAL HERNIA REPAIR N/A 05/29/2018   Procedure: LAPAROSCOPIC VENTRAL / LATERAL HERNIA REPAIR;  Surgeon: Jacolyn Matar, MD;  Location: [facility];  Service: General;  Laterality: N/A;    Home Medications:  Allergies as of 04/15/2024       Reactions   Sulfa Antibiotics Anaphylaxis, Hives, Itching, Shortness Of Breath, Swelling   Lips & throat swelled   Nitrofuran Derivatives Swelling   Lip swelling; "eyes get red and puffy"   Amlodipine  Besylate Other (See Comments)   gas, bloating  Heart racing         Medication List        Accurate as of Apr 15, 2024 10:33 AM. If you have  any questions, ask your nurse or doctor.          atorvastatin  20 MG tablet Commonly known as: LIPITOR Take 1 tablet (20 mg total) by mouth daily.   ciprofloxacin  250 MG tablet Commonly known as: CIPRO  Take 1 tablet (250 mg total) by mouth 2 (two) times daily for 7 days.   levothyroxine  88 MCG tablet Commonly known as: SYNTHROID  Take 1 tablet (88 mcg total) by mouth daily before breakfast. 30 minutes   Melatonin 10 MG Tabs Take 20 mg by mouth daily.   metoprolol  succinate 50 MG 24 hr tablet Commonly known as: TOPROL -XL Take 1 tablet (50 mg total) by mouth daily. Take with or immediately following a meal.   Multi-Vitamins Tabs Take 1 tablet by mouth daily.   telmisartan  80 MG tablet Commonly known as: MICARDIS  Take 1 tablet (80 mg total) by mouth daily.        Allergies:  Allergies  Allergen Reactions   Sulfa Antibiotics Anaphylaxis, Hives, Itching, Shortness Of Breath and Swelling    Lips & throat swelled   Nitrofuran Derivatives Swelling    Lip swelling; "eyes get red and puffy"   Amlodipine  Besylate Other (See Comments)    gas, bloating  Heart racing     Family History: Family History  Problem Relation Age of Onset   Arthritis Mother    Heart disease Mother 43       stent    Hyperlipidemia Mother    Hypertension Mother    Coronary artery disease Mother 39   Arthritis Father    Diabetes Father    Cancer Father        colon   AAA (abdominal aortic aneurysm) Father    Arthritis Sister    Hyperlipidemia Sister    Hypertension Sister    Cancer Brother        lung, smoker   Mental retardation Brother    Drug abuse Daughter        overdose in 2018    Arthritis Sister    Hyperlipidemia Sister    Bladder Cancer Neg Hx    Kidney cancer Neg Hx    Breast cancer Neg Hx     Social History:   reports that she has never smoked. She has never been exposed to tobacco smoke. She has never used smokeless tobacco. She reports that she does not currently use  alcohol. She reports that she does not use drugs.  Physical Exam: BP (!) 179/87   Pulse 83   Ht 4\' 11"  (1.499 m)   Wt 138 lb (62.6 kg)   BMI 27.87 kg/m  Constitutional:  Alert and oriented, no acute distress, nontoxic appearing HEENT: [place], AT Cardiovascular: No clubbing, cyanosis, or edema Respiratory: Normal respiratory effort, no increased work of breathing Skin: No rashes, bruises or suspicious lesions Neurologic: Grossly intact, no focal deficits, moving all 4 extremities Psychiatric: Normal mood and affect  Laboratory Data: Results for orders placed or performed in visit on 04/15/24  Microscopic Examination   Collection Time: 04/15/24  9:45 AM   Urine  Result Value Ref Range   WBC, UA >30 (A) 0 - 5 /hpf   RBC, Urine 11-30 (A) 0 - 2 /hpf   Epithelial Cells (non renal) 0-10 0 - 10 /hpf   Casts Present (A) None seen /lpf   Cast Type Hyaline casts N/A   Bacteria, UA Many (A) None seen/Few  Urinalysis, Complete   Collection Time: 04/15/24  9:45 AM  Result Value Ref Range   Specific Gravity, UA 1.015 1.005 - 1.030   pH, UA 7.0 5.0 - 7.5   Color, UA Yellow Yellow   Appearance Ur Slightly cloudy Clear   Leukocytes,UA 3+ (A) Negative   Protein,UA Negative Negative/Trace   Glucose, UA Negative Negative   Ketones, UA Negative Negative   RBC, UA 2+ (A) Negative   Bilirubin, UA Negative Negative   Urobilinogen, Ur 0.2 0.2 - 1.0 mg/dL   Nitrite, UA Positive (A) Negative   Microscopic Examination See below:    Assessment & Plan:   1. Recurrent UTI (Primary) UA grossly positive.  I am concerned for an ascending infection with reports of LLQ and left flank pain, nausea, and fatigue.  Will start empiric Cipro  250 mg twice daily x 7 days and send for culture for further evaluation.  I am also sending in 2 doses of Diflucan , because she tends to get yeast infections due to antibiotics.  We discussed return precautions including fever or worsening pain. - Urinalysis, Complete -  ciprofloxacin  (CIPRO ) 250 MG tablet; Take 1 tablet (250 mg total) by mouth 2 (two) times daily for 7 days.  Dispense: 14 tablet; Refill: 0 - CULTURE, URINE COMPREHENSIVE - fluconazole  (DIFLUCAN ) 150 MG tablet; Take one tablet by mouth. If symptoms are no better, take a second dose 2-3 days later.  Dispense: 2 tablet; Refill: 0   Return if symptoms worsen or fail to improve.  Kathreen Pare, PA-C  [facility] [address], [address] [place], [place] [address] [phone]

## 2024-04-20 LAB — CULTURE, URINE COMPREHENSIVE

## 2024-04-30 ENCOUNTER — Ambulatory Visit: Admitting: Physician Assistant

## 2024-05-05 ENCOUNTER — Telehealth: Payer: Self-pay

## 2024-05-05 NOTE — Telephone Encounter (Signed)
 Copied from CRM [id number]. Topic: Clinical - Request for Lab/Test Order >> May 05, 2024 11:35 AM Adonis Hoot wrote: Reason for CRM: Patient needs order for her mammogram to be sent to [facility] in [place] .She isnt due until after July 23,2025. But they are needing a new order.

## 2024-05-05 NOTE — Addendum Note (Signed)
 Addended by: Barb Levers on: 05/05/2024 11:49 AM   Modules accepted: Orders

## 2024-05-05 NOTE — Telephone Encounter (Signed)
 Mammo ordered has been sent.

## 2024-05-06 ENCOUNTER — Ambulatory Visit: Payer: Self-pay

## 2024-05-06 NOTE — Telephone Encounter (Signed)
 Called pt and she did not want me to scheduled her an appointment. She stated that she is going to look for another practice because [facility] always keeps changing dr, and she is not able to ever get in with her primary.

## 2024-05-06 NOTE — Telephone Encounter (Signed)
 FYI Only or Action Required?: Action required by provider  Patient was last seen in primary care on 08/11/2023 by Valli Gaw, MD. Called Nurse Triage reporting Pain. Symptoms began a week ago. Interventions attempted: OTC medications: motrin , Rest, hydration, or home remedies, and Ice/heat application. Symptoms are: unchanged.  Triage Disposition: See PCP When Office is Open (Within 3 Days)  Patient/caregiver understands and will follow disposition?: Unsure  Copied from CRM [id number]. Topic: Clinical - Red Word Triage >> May 06, 2024  3:04 PM Marlan Silva wrote: Red Word that prompted transfer to Nurse Triage: Pain left side, around the back where her waist is. Patient has tried OTC medication and the pain won't go away. Patient also stated that she is extremely tired. Reason for Disposition  [1] MODERATE back pain (e.g., interferes with normal activities) AND [2] present > 3 days  Answer Assessment - Initial Assessment Questions 1. ONSET: When did the pain begin?      1-1.5 weeks 2. LOCATION: Where does it hurt? (upper, mid or lower back)     Mid left sided back 3. SEVERITY: How bad is the pain?  (e.g., Scale 1-10; mild, moderate, or severe)   - MILD (1-3): Doesn't interfere with normal activities.    - MODERATE (4-7): Interferes with normal activities or awakens from sleep.    - SEVERE (8-10): Excruciating pain, unable to do any normal activities.      Moderate 4. PATTERN: Is the pain constant? (e.g., yes, no; constant, intermittent)      constant 5. RADIATION: Does the pain shoot into your legs or somewhere else?     Left leg 6. CAUSE:  What do you think is causing the back pain?      Unsure 7. BACK OVERUSE:  Any recent lifting of heavy objects, strenuous work or exercise?     denies 8. MEDICINES: What have you taken so far for the pain? (e.g., nothing, acetaminophen , NSAIDS)     ibuprophen 9. NEUROLOGIC SYMPTOMS: Do you have any weakness, numbness, or problems with  bowel/bladder control?     Denies 10. OTHER SYMPTOMS: Do you have any other symptoms? (e.g., fever, abdomen pain, burning with urination, blood in urine)       Denies, increase urination at night Additional notes: Ice, motrin  without effect. UTI a few weeks back, she completed treatment. Refused urgent care, would like appointment in office.  Protocols used: Back Pain-A-AH

## 2024-05-10 ENCOUNTER — Ambulatory Visit (INDEPENDENT_AMBULATORY_CARE_PROVIDER_SITE_OTHER): Admitting: Physician Assistant

## 2024-05-10 ENCOUNTER — Encounter: Payer: Self-pay | Admitting: Physician Assistant

## 2024-05-10 VITALS — BP 166/96 | HR 73 | Temp 98.2°F | Ht 59.0 in | Wt 139.0 lb

## 2024-05-10 DIAGNOSIS — R109 Unspecified abdominal pain: Secondary | ICD-10-CM | POA: Diagnosis not present

## 2024-05-10 DIAGNOSIS — Z87442 Personal history of urinary calculi: Secondary | ICD-10-CM

## 2024-05-10 DIAGNOSIS — N390 Urinary tract infection, site not specified: Secondary | ICD-10-CM | POA: Diagnosis not present

## 2024-05-10 LAB — URINALYSIS, COMPLETE
Bilirubin, UA: NEGATIVE
Glucose, UA: NEGATIVE
Ketones, UA: NEGATIVE
Leukocytes,UA: NEGATIVE
Nitrite, UA: NEGATIVE
Protein,UA: NEGATIVE
RBC, UA: NEGATIVE
Specific Gravity, UA: 1.01 (ref 1.005–1.030)
Urobilinogen, Ur: 0.2 mg/dL (ref 0.2–1.0)
pH, UA: 7 (ref 5.0–7.5)

## 2024-05-10 LAB — MICROSCOPIC EXAMINATION: Bacteria, UA: NONE SEEN

## 2024-05-10 NOTE — Progress Notes (Signed)
 05/10/2024 11:47 AM   [name] 16-Apr-1946 [id number]  CC: Chief Complaint  Patient presents with   Other   HPI: Maria Spencer is a 78 y.o. 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   I saw her in clinic most recently in last month for UTI.  Her UA was grossly positive and I started her on empiric Cipro .  Urine culture grew Cipro  susceptible Proteus mirabilis.  Today she reports onset of sharp, intermittent left flank pain and tenderness around the time of her UTI.  She has a history of back problems and wants to rule out urologic sources for her pain before she follows up with neurosurgery.  Notably, she does have a remote history of 1 kidney stone.  She is also been having some nausea, but admits that this could be associated with an ongoing sinus infection.  No rash, fever, chills, or vomiting.  In-office UA and microscopy pan negative.  PMH: Past Medical History:  Diagnosis Date   Arthritis    neck, back, left knee;    Breast cancer (HCC) 2009   right lumpectomy s/p radiation x 1 week 2x per day only    Chicken pox    Chronic UTI    established with urology    COVID-19    ? 12/2020 sob  took antiviral, 08/15/21   Diverticular disease    -osis and -itis    Diverticulitis    Esophagitis    egd 05/17/14 see report scanned into chart   GERD (gastroesophageal reflux disease)    h/o   History of kidney stones    Hormone disorder    Hyperlipidemia    Hypertension    controlled well with medication;    Hypothyroidism    LVH (left ventricular hypertrophy)    mild   MR (mitral regurgitation)    Osteoporosis    PAC (premature atrial contraction)    Personal history of radiation therapy 2009   F/U right breast cancer   PSVT (paroxysmal supraventricular tachycardia) (HCC)    PVC (premature ventricular contraction)    Sepsis (HCC)    Thyroid  disease     hypothyroidism     Surgical History: Past Surgical History:  Procedure Laterality Date   BONE GRAFT HIP ILIAC CREST  2018   + cage left hip 10/23/17    BREAST BIOPSY Left 2009   clip,benign   BREAST BIOPSY Right 2009   +   BREAST LUMPECTOMY Right 2009   2009 lumpectomy    CARDIAC CATHETERIZATION  2001   No stents;  results were WNL Dr. requested it for HTN   CATARACT EXTRACTION, BILATERAL Bilateral 10/2019   COLON SURGERY  03/2023   COLONOSCOPY WITH PROPOFOL  N/A 05/15/2020   Procedure: COLONOSCOPY WITH PROPOFOL ;  Surgeon: Selena Daily, MD;  Location: [facility];  Service: Gastroenterology;  Laterality: N/A;   HARDWARE REMOVAL N/A 03/12/2023   Procedure: REMOVAL OF ILIAC SCREWS;  Surgeon: Jodeen Munch, MD;  Location: [facility];  Service: Neurosurgery;  Laterality: N/A;   INSERTION OF MESH N/A 05/29/2018   Procedure: INSERTION OF MESH;  Surgeon: Jacolyn Matar, MD;  Location: [facility];  Service: General;  Laterality: N/A;   QUADRICEPS TENDON REPAIR Right 12/05/2020   Procedure: OPEN REPAIR OF RIGHT GLUTEUS MINIMUS TENDON;  Surgeon: Elner Hahn, MD;  Location: [facility];  Service: Orthopedics;  Laterality: Right;   right gluteus medius/minimus repair Dr. Daun Epstein 11/2020  Dr. Daun Epstein   SHOULDER SURGERY Left 2008, 2009   x2 rotator cuff surgeries left shoulders   SPINAL FUSION  07/24/2020   SPINE SURGERY  10/23/2017   spinal 09/2017 h/o scoliosis [facility] L4-S1 OLIF T10 to ililum fusion   TONSILLECTOMY     age 82 y.o.    TOTAL KNEE ARTHROPLASTY Left 03/18/2022   Procedure: TOTAL KNEE ARTHROPLASTY;  Surgeon: Murleen Arms, MD;  Location: [facility];  Service: Orthopedics;  Laterality: Left;   TOTAL SHOULDER REPLACEMENT Right 2012   TUBAL LIGATION     age 55   VENTRAL HERNIA REPAIR N/A 05/29/2018   Procedure: LAPAROSCOPIC VENTRAL / LATERAL HERNIA REPAIR;  Surgeon: Jacolyn Matar, MD;  Location: [facility];  Service: General;  Laterality: N/A;    Home Medications:   Allergies as of 05/10/2024       Reactions   Sulfa Antibiotics Anaphylaxis, Hives, Itching, Shortness Of Breath, Swelling   Lips & throat swelled   Nitrofuran Derivatives Swelling   Lip swelling; eyes get red and puffy   Amlodipine  Besylate Other (See Comments)   gas, bloating  Heart racing         Medication List        Accurate as of May 10, 2024 11:47 AM. If you have any questions, ask your nurse or doctor.          atorvastatin  20 MG tablet Commonly known as: LIPITOR Take 1 tablet (20 mg total) by mouth daily.   fluconazole  150 MG tablet Commonly known as: DIFLUCAN  Take one tablet by mouth. If symptoms are no better, take a second dose 2-3 days later.   levothyroxine  88 MCG tablet Commonly known as: SYNTHROID  Take 1 tablet (88 mcg total) by mouth daily before breakfast. 30 minutes   Melatonin 10 MG Tabs Take 20 mg by mouth daily.   metoprolol  succinate 50 MG 24 hr tablet Commonly known as: TOPROL -XL Take 1 tablet (50 mg total) by mouth daily. Take with or immediately following a meal.   Multi-Vitamins Tabs Take 1 tablet by mouth daily.   telmisartan  80 MG tablet Commonly known as: MICARDIS  Take 1 tablet (80 mg total) by mouth daily.        Allergies:  Allergies  Allergen Reactions   Sulfa Antibiotics Anaphylaxis, Hives, Itching, Shortness Of Breath and Swelling    Lips & throat swelled   Nitrofuran Derivatives Swelling    Lip swelling; eyes get red and puffy   Amlodipine  Besylate Other (See Comments)    gas, bloating  Heart racing     Family History: Family History  Problem Relation Age of Onset   Arthritis Mother    Heart disease Mother 65       stent    Hyperlipidemia Mother    Hypertension Mother    Coronary artery disease Mother 40   Arthritis Father    Diabetes Father    Cancer Father        colon   AAA (abdominal aortic aneurysm) Father    Arthritis Sister    Hyperlipidemia Sister    Hypertension Sister    Cancer  Brother        lung, smoker   Mental retardation Brother    Drug abuse Daughter        overdose in 2018    Arthritis Sister    Hyperlipidemia Sister    Bladder Cancer Neg Hx    Kidney cancer Neg Hx    Breast cancer Neg Hx  Social History:   reports that she has never smoked. She has never been exposed to tobacco smoke. She has never used smokeless tobacco. She reports that she does not currently use alcohol. She reports that she does not use drugs.  Physical Exam: BP (!) 166/96   Pulse 73   Temp 98.2 F (36.8 C) (Oral)   Ht 4' 11 (1.499 m)   Wt 139 lb (63 kg)   BMI 28.07 kg/m   Constitutional:  Alert and oriented, no acute distress, nontoxic appearing HEENT: [place], AT Cardiovascular: No clubbing, cyanosis, or edema Respiratory: Normal respiratory effort, no increased work of breathing Skin: No rashes, bruises or suspicious lesions Neurologic: Grossly intact, no focal deficits, moving all 4 extremities Psychiatric: Normal mood and affect  Laboratory Data: Results for orders placed or performed in visit on 05/10/24  Microscopic Examination   Collection Time: 05/10/24 10:59 AM   Urine  Result Value Ref Range   WBC, UA 0-5 0 - 5 /hpf   RBC, Urine 0-2 0 - 2 /hpf   Epithelial Cells (non renal) 0-10 0 - 10 /hpf   Bacteria, UA None seen None seen/Few  Urinalysis, Complete   Collection Time: 05/10/24 10:59 AM  Result Value Ref Range   Specific Gravity, UA 1.010 1.005 - 1.030   pH, UA 7.0 5.0 - 7.5   Color, UA Yellow Yellow   Appearance Ur Clear Clear   Leukocytes,UA Negative Negative   Protein,UA Negative Negative/Trace   Glucose, UA Negative Negative   Ketones, UA Negative Negative   RBC, UA Negative Negative   Bilirubin, UA Negative Negative   Urobilinogen, Ur 0.2 0.2 - 1.0 mg/dL   Nitrite, UA Negative Negative   Microscopic Examination Comment    Microscopic Examination See below:    Assessment & Plan:   1. Flank pain with history of urolithiasis (Primary) UA  bland.  I think her pain is more likely to be musculoskeletal in origin, though we will get a renal ultrasound given her history of urolithiasis.  She is in agreement. - Urinalysis, Complete - US  RENAL; Future   Return for Will call with results.  Kathreen Pare, PA-C  [facility] [place] [address] [place], [place] [address] [phone]

## 2024-05-11 ENCOUNTER — Ambulatory Visit: Attending: Internal Medicine

## 2024-05-11 DIAGNOSIS — R0609 Other forms of dyspnea: Secondary | ICD-10-CM | POA: Insufficient documentation

## 2024-05-11 LAB — ECHOCARDIOGRAM COMPLETE
AR max vel: 2.35 cm2
AV Area VTI: 2.42 cm2
AV Area mean vel: 2.3 cm2
AV Mean grad: 3 mmHg
AV Peak grad: 5.1 mmHg
Ao pk vel: 1.13 m/s
Area-P 1/2: 2.6 cm2
S' Lateral: 2.13 cm

## 2024-05-12 ENCOUNTER — Ambulatory Visit: Payer: Self-pay | Admitting: Internal Medicine

## 2024-05-18 ENCOUNTER — Ambulatory Visit: Admitting: Urology

## 2024-05-18 VITALS — BP 178/78 | HR 89 | Ht 59.0 in | Wt 139.0 lb

## 2024-05-18 DIAGNOSIS — N390 Urinary tract infection, site not specified: Secondary | ICD-10-CM | POA: Diagnosis not present

## 2024-05-18 DIAGNOSIS — Z87442 Personal history of urinary calculi: Secondary | ICD-10-CM

## 2024-05-18 DIAGNOSIS — N8111 Cystocele, midline: Secondary | ICD-10-CM

## 2024-05-18 DIAGNOSIS — R109 Unspecified abdominal pain: Secondary | ICD-10-CM | POA: Diagnosis not present

## 2024-05-18 DIAGNOSIS — N3281 Overactive bladder: Secondary | ICD-10-CM | POA: Diagnosis not present

## 2024-05-18 LAB — URINALYSIS, COMPLETE
Bilirubin, UA: NEGATIVE
Glucose, UA: NEGATIVE
Ketones, UA: NEGATIVE
Nitrite, UA: POSITIVE — AB
Specific Gravity, UA: 1.02 (ref 1.005–1.030)
Urobilinogen, Ur: 0.2 mg/dL (ref 0.2–1.0)
pH, UA: 6.5 (ref 5.0–7.5)

## 2024-05-18 LAB — MICROSCOPIC EXAMINATION: WBC, UA: >30 /HPF — AB (ref 0–5)

## 2024-05-18 MED ORDER — CIPROFLOXACIN HCL 250 MG PO TABS
250.0000 mg | ORAL_TABLET | Freq: Two times a day (BID) | ORAL | 0 refills | Status: DC
Start: 2024-05-18 — End: 2024-06-14

## 2024-05-18 NOTE — Telephone Encounter (Signed)
 Patient contacted [facility] and was informed that she needs authorization before she can be scheduled for her mammogram. Per patient they didn't elaborate on what type of authorization was needed. Please contact patient to advise [phone] as patient previously had breast cancer and gets annual mammograms.

## 2024-05-18 NOTE — Telephone Encounter (Unsigned)
 Copied from CRM [id number]. Topic: General - Call Back - No Documentation >> May 18, 2024  1:30 PM [name] wrote: Reason for CRM: Patient states she's returning a call to [facility], no notes or messages in system states it's regarding an authorization for breast exam  Maria Spencer [phone]

## 2024-05-18 NOTE — Telephone Encounter (Signed)
 Called and informed pt that her mammo has been order, and she can call and get it scheduled now.

## 2024-05-18 NOTE — Addendum Note (Signed)
 Addended by: Perkins Molina on: 05/18/2024 10:41 AM   Modules accepted: Orders

## 2024-05-18 NOTE — Telephone Encounter (Signed)
 Left message to return call to our office.  Please let pt know that the order for the mammography are in , and she can call and scheduled an appointment. Please document when pt is spoke to.

## 2024-05-20 ENCOUNTER — Ambulatory Visit: Admitting: Cardiology

## 2024-05-20 LAB — CULTURE, URINE COMPREHENSIVE

## 2024-05-21 ENCOUNTER — Ambulatory Visit
Admission: RE | Admit: 2024-05-21 | Discharge: 2024-05-21 | Disposition: A | Discharge status: Home / Self Care | Source: Ambulatory Visit | Attending: Physician Assistant

## 2024-05-21 DIAGNOSIS — N200 Calculus of kidney: Secondary | ICD-10-CM | POA: Diagnosis not present

## 2024-05-21 DIAGNOSIS — R109 Unspecified abdominal pain: Secondary | ICD-10-CM | POA: Insufficient documentation

## 2024-05-21 DIAGNOSIS — Z87442 Personal history of urinary calculi: Secondary | ICD-10-CM | POA: Diagnosis not present

## 2024-05-22 ENCOUNTER — Encounter: Payer: Self-pay | Admitting: Urology

## 2024-05-22 NOTE — Progress Notes (Signed)
 05/18/2024 3:54 PM   Maria Spencer 1946/05/24 [id number]  Referring provider: Hope Merle, MD [address] [place],  [place] [address]  Urological history: 1. rUTI's - Apr 15, 2024 - proteus mirabilis  2. Nephrolithiasis - spontaneous passage (2016)   3. Cystocele  Chief Complaint  Patient presents with   Flank Pain   HPI: Maria Spencer is a 78 y.o. woman who presents today for frequency, pressure and lower back pain with radiation.    Previous records reviewed.   On Saturday, she started to experience urinary frequecy, urgency, suprapubic pressure and lower back pain.  She has had some nausea.  She has been taking AZO for pain relief.   Patient denies any modifying or aggravating factors.  Patient denies any recent UTI's, gross hematuria, dysuria or flank pain.  Patient denies any fevers, chills or vomiting.    She has to lean forward to empty bladder.   UA grossly infected w/ micro heme.    She states she has had a history of rUTI's and had been on Cipro  daily for prophylaxis years ago.    She also expressed concerns that she has been having urinary issues since part of her colon was removed for diverticulitis last year.     She is a non smoker and does not consume alcohol.    Serum creatinine (03/2024) 0.54  She was seen on 05/10/2024 for symptoms of possible UTI.  She was having left flank pain.  At that visit, her urine was bland and a renal ultrasound was ordered.  It is scheduled for Friday.    PMH: Past Medical History:  Diagnosis Date   Arthritis    neck, back, left knee;    Breast cancer (HCC) 2009   right lumpectomy s/p radiation x 1 week 2x per day only    Chicken pox    Chronic UTI    established with urology    COVID-19    ? 12/2020 sob  took antiviral, 08/15/21   Diverticular disease    -osis and -itis    Diverticulitis    Esophagitis    egd 05/17/14 see report scanned into chart   GERD (gastroesophageal reflux disease)    h/o    History of kidney stones    Hormone disorder    Hyperlipidemia    Hypertension    controlled well with medication;    Hypothyroidism    LVH (left ventricular hypertrophy)    mild   MR (mitral regurgitation)    Osteoporosis    PAC (premature atrial contraction)    Personal history of radiation therapy 2009   F/U right breast cancer   PSVT (paroxysmal supraventricular tachycardia) (HCC)    PVC (premature ventricular contraction)    Sepsis (HCC)    Thyroid  disease    hypothyroidism     Surgical History: Past Surgical History:  Procedure Laterality Date   BONE GRAFT HIP ILIAC CREST  2018   + cage left hip 10/23/17    BREAST BIOPSY Left 2009   clip,benign   BREAST BIOPSY Right 2009   +   BREAST LUMPECTOMY Right 2009   2009 lumpectomy    CARDIAC CATHETERIZATION  2001   No stents;  results were WNL Dr. requested it for HTN   CATARACT EXTRACTION, BILATERAL Bilateral 10/2019   COLON SURGERY  03/2023   COLONOSCOPY WITH PROPOFOL  N/A 05/15/2020   Procedure: COLONOSCOPY WITH PROPOFOL ;  Surgeon: Unk Corinn Skiff, MD;  Location: [facility];  Service: Gastroenterology;  Laterality:  N/A;   HARDWARE REMOVAL N/A 03/12/2023   Procedure: REMOVAL OF ILIAC SCREWS;  Surgeon: Clois Fret, MD;  Location: [facility];  Service: Neurosurgery;  Laterality: N/A;   INSERTION OF MESH N/A 05/29/2018   Procedure: INSERTION OF MESH;  Surgeon: Gladis Cough, MD;  Location: [facility];  Service: General;  Laterality: N/A;   QUADRICEPS TENDON REPAIR Right 12/05/2020   Procedure: OPEN REPAIR OF RIGHT GLUTEUS MINIMUS TENDON;  Surgeon: Edie Norleen PARAS, MD;  Location: [facility];  Service: Orthopedics;  Laterality: Right;   right gluteus medius/minimus repair Dr. Edie 11/2020      Dr. Edie   SHOULDER SURGERY Left 2008, 2009   x2 rotator cuff surgeries left shoulders   SPINAL FUSION  07/24/2020   SPINE SURGERY  10/23/2017   spinal 09/2017 h/o scoliosis [facility] L4-S1 OLIF T10 to ililum fusion   TONSILLECTOMY      age 53 y.o.    TOTAL KNEE ARTHROPLASTY Left 03/18/2022   Procedure: TOTAL KNEE ARTHROPLASTY;  Surgeon: Edna Toribio LABOR, MD;  Location: [facility];  Service: Orthopedics;  Laterality: Left;   TOTAL SHOULDER REPLACEMENT Right 2012   TUBAL LIGATION     age 11   VENTRAL HERNIA REPAIR N/A 05/29/2018   Procedure: LAPAROSCOPIC VENTRAL / LATERAL HERNIA REPAIR;  Surgeon: Gladis Cough, MD;  Location: [facility];  Service: General;  Laterality: N/A;    Home Medications:  Allergies as of 05/18/2024       Reactions   Sulfa Antibiotics Anaphylaxis, Hives, Itching, Shortness Of Breath, Swelling   Lips & throat swelled   Nitrofuran Derivatives Swelling   Lip swelling; eyes get red and puffy   Amlodipine  Besylate Other (See Comments)   gas, bloating  Heart racing         Medication List        Accurate as of May 18, 2024 11:59 PM. If you have any questions, ask your nurse or doctor.          atorvastatin  20 MG tablet Commonly known as: LIPITOR Take 1 tablet (20 mg total) by mouth daily.   ciprofloxacin  250 MG tablet Commonly known as: Cipro  Take 1 tablet (250 mg total) by mouth 2 (two) times daily.   fluconazole  150 MG tablet Commonly known as: DIFLUCAN  Take one tablet by mouth. If symptoms are no better, take a second dose 2-3 days later.   levothyroxine  88 MCG tablet Commonly known as: SYNTHROID  Take 1 tablet (88 mcg total) by mouth daily before breakfast. 30 minutes   Melatonin 10 MG Tabs Take 20 mg by mouth daily.   metoprolol  succinate 50 MG 24 hr tablet Commonly known as: TOPROL -XL Take 1 tablet (50 mg total) by mouth daily. Take with or immediately following a meal.   Multi-Vitamins Tabs Take 1 tablet by mouth daily.   telmisartan  80 MG tablet Commonly known as: MICARDIS  Take 1 tablet (80 mg total) by mouth daily.        Allergies:  Allergies  Allergen Reactions   Sulfa Antibiotics Anaphylaxis, Hives, Itching, Shortness Of Breath and Swelling     Lips & throat swelled   Nitrofuran Derivatives Swelling    Lip swelling; eyes get red and puffy   Amlodipine  Besylate Other (See Comments)    gas, bloating  Heart racing     Family History: Family History  Problem Relation Age of Onset   Arthritis Mother    Heart disease Mother 53       stent    Hyperlipidemia Mother  Hypertension Mother    Coronary artery disease Mother 38   Arthritis Father    Diabetes Father    Cancer Father        colon   AAA (abdominal aortic aneurysm) Father    Arthritis Sister    Hyperlipidemia Sister    Hypertension Sister    Cancer Brother        lung, smoker   Mental retardation Brother    Drug abuse Daughter        overdose in 2018    Arthritis Sister    Hyperlipidemia Sister    Bladder Cancer Neg Hx    Kidney cancer Neg Hx    Breast cancer Neg Hx     Social History: See HPI for pertinent social history  ROS: Pertinent ROS in HPI  Physical Exam: BP (!) 178/78   Pulse 89   Ht 4' 11 (1.499 m)   Wt 139 lb (63 kg)   BMI 28.07 kg/m   Constitutional:  Well nourished. Alert and oriented, No acute distress. HEENT: [place] AT, moist mucus membranes.  Trachea midline Cardiovascular: No clubbing, cyanosis, or edema. Respiratory: Normal respiratory effort, no increased work of breathing. Neurologic: Grossly intact, no focal deficits, moving all 4 extremities. Psychiatric: Normal mood and affect.  Laboratory Data: See EPIC and HPI  I have reviewed the labs.   Pertinent Imaging: N/A  Assessment & Plan:    1. Recurrent UTI (Primary) - Urinalysis, Complete, grossly infected - CULTURE, URINE COMPREHENSIVE pending - start Cipro  250 mg BID x 7 days, will adjust if necessary once culture sensitivities are available - explained the black box warnings on Cipro  and we do not use this antibiotic as prophylaxis for rUTI's at this time and will need to consider a different antibiotic if we get to the point that she needs a prophylactic  antibiotic  2. Left flank pain - she has a remote history of stones - RUS is pending - reviewed red flag signs, (high grade fevers, intractable pain or vomiting)  3. Cystocele  - she will continue to lean forward to empty bladder for the time being - will reassess once possible infection is treated and RUS are available  4. OAB - states she has had urinary issues since bowel resection - will reassess once infection has cleared  Return for Pending urine culture and RUS results .  These notes generated with voice recognition software. I apologize for typographical errors.  CLOTILDA HELON RIGGERS  [facility] [address]  [address] [place], [place] [address] [phone]

## 2024-05-25 ENCOUNTER — Other Ambulatory Visit: Payer: Self-pay | Admitting: Urology

## 2024-05-25 ENCOUNTER — Ambulatory Visit: Payer: Self-pay | Admitting: Urology

## 2024-05-25 ENCOUNTER — Other Ambulatory Visit: Payer: Self-pay | Admitting: Family Medicine

## 2024-05-25 DIAGNOSIS — N390 Urinary tract infection, site not specified: Secondary | ICD-10-CM

## 2024-05-25 DIAGNOSIS — R102 Pelvic and perineal pain: Secondary | ICD-10-CM

## 2024-05-25 DIAGNOSIS — I10 Essential (primary) hypertension: Secondary | ICD-10-CM

## 2024-05-25 LAB — CULTURE, URINE COMPREHENSIVE

## 2024-05-25 MED ORDER — TRAMADOL HCL 50 MG PO TABS: 50.0000 mg | ORAL_TABLET | Freq: Four times a day (QID) | ORAL | 0 refills | Status: AC | PRN

## 2024-05-25 MED ORDER — AMOXICILLIN-POT CLAVULANATE 875-125 MG PO TABS: 1.0000 | ORAL_TABLET | Freq: Two times a day (BID) | ORAL | 0 refills | Status: DC

## 2024-05-31 ENCOUNTER — Encounter: Payer: Self-pay | Admitting: Physician Assistant

## 2024-05-31 ENCOUNTER — Ambulatory Visit: Payer: Self-pay | Admitting: Physician Assistant

## 2024-06-13 NOTE — Progress Notes (Unsigned)
 PROVIDER NOTE: Interpretation of information contained herein should be left to medically-trained personnel. Specific patient instructions are provided elsewhere under Patient Instructions section of medical record. This document was created in part using AI and STT-dictation technology, any transcriptional errors that may result from this process are unintentional.  Patient: Maria Spencer  Service: E/M   PCP: No primary care provider on file.  DOB: 1946-09-29  DOS: 06/14/2024  Provider: Eric DELENA Como, MD  MRN: [id number]  Delivery: Face-to-face  Specialty: Interventional Pain Management  Type: Established Patient  Setting: Ambulatory outpatient facility  Specialty designation: 09  Referring Prov.: No ref. provider found  Location: Outpatient office facility       History of present illness (HPI) [name], a 78 y.o. year old female, is here today because of her Cervicalgia [M54.2]. [name] primary complain today is No chief complaint on file.  Pertinent problems: 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; Arthritis; Chronic hip pain (2ry area of Pain) (Bilateral) (R>L); Chronic low back pain with sciatica; Chronic thoracic back pain (1ry area of Pain) (Bilateral) (R>L); Fusion of spine of lumbosacral region; Lumbosacral spondylosis with radiculopathy; Chronic pain syndrome; Chronic lower extremity pain (3ry area of Pain) (Right); Thoracic facet syndrome (Bilateral); Chronic sacroiliac joint pain (Bilateral); Spondylosis without myelopathy or radiculopathy, thoracic region; Closed wedge compression fracture of T8 vertebra (HCC); Osteoarthritis of hips (Bilateral); Abnormal thoracolumbar CT myelogram (01/05/2020); 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); DDD (degenerative disc disease), cervical; DDD (degenerative disc disease), thoracic; DDD (degenerative disc disease), lumbar; Displacement of thoracic  intervertebral disc (C6-T3, T4-5, T6-7, T8-9); Non-traumatic compression fracture of T8 thoracic vertebra, sequela; Chest pain of uncertain etiology; Cervicalgia; Chronic upper extremity pain (Right); Cervical spondylosis with radiculopathy (Right); Cervical radiculopathy at C7 (Right); Chronic shoulder pain (Bilateral) (R>L); Chronic shoulder pain after shoulder replacement (Right); Osteoarthritis of AC (acromioclavicular) joints (Bilateral); Chronic Acromioclavicular (AC) joint pain (Bilateral); Osteoarthritis of glenohumeral joint (Left); Tear of right gluteus minimus tendon; Abnormal MRI, cervical spine (08/06/2022); Cervical facet syndrome (Bilateral); Cervicogenic headache (Bilateral); Occipital neuralgia (greater occipital nerve) (Bilateral); Cervico-occipital neuralgia (Bilateral); Cervical facet hypertrophy (Multilevel) (Bilateral); Spondylosis without myelopathy or radiculopathy, cervical region; Scoliosis of thoracic spine, unspecified scoliosis type; 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/20/2017); Chronic bursitis of shoulder area (Right); Chronic shoulder pain (Right); Shoulder blade pain (Right); Tear of right gluteus medius tendon; Osteoarthritis of knee (Left); Partial tear of right hamstring; Localized osteoarthritis of left knee; Weakness of both lower extremities; Failed orthopedic implant (HCC); S/P spinal fusion; S/P hardware removal; Cervical facet joint pain; Mechanical low back pain; Pain in joint of right knee; Acquired spondylolisthesis; Arthritis of right knee; Carpal tunnel syndrome; Full thickness rotator cuff tear; Malignant neoplastic disease (HCC); Spinal stenosis of lumbar region; Tendinopathy of left gluteus medius; Trochanteric bursitis of right hip; Idiopathic peripheral neuropathy; Cervical radiculopathy; Degeneration of lumbar intervertebral disc; Lumbar spondylosis; Lumbosacral radiculitis; Lumbosacral radiculopathy; Lumbosacral spondylosis without myelopathy;  and Degenerative joint disease of shoulder region on their pertinent problem list.  Pain Assessment: Severity of   is reported as a  /10. Location:    / . Onset:  . Quality:  . Timing:  . Modifying factor(s):  SABRA Vitals:  vitals were not taken for this visit.  BMI: Estimated body mass index is 28.07 kg/m as calculated from the following:   Height as of 05/18/24: 4' 11 (1.499 m).   Weight as of 05/18/24: 139 lb (  63 kg).  Last encounter: 06/23/2023. Last procedure: 09/09/2023.  Reason for encounter: post-procedure evaluation and assessment.   Discussed the use of AI scribe software for clinical note transcription with the patient, who gave verbal consent to proceed.  History of Present Illness          Post-Procedure Evaluation   Procedure: Cervical Facet, Medial Branch Radiofrequency Ablation (RFA)  #1  Laterality: Left (-LT)  Level: C3, C4, C5, C6, and C7 Medial Branch Level(s). Lesioning of these levels should completely denervate the C3-4, C4-5, and C5-6 cervical facet joints.  Imaging: Fluoroscopy-guided Spinal ([id number]) Anesthesia: Local anesthesia (1-2% Lidocaine ) Anxiolysis: IV Versed  2.0 mg Sedation: Moderate Sedation Fentanyl  0.5 mL (25 mcg) DOS: 09/09/2023  Performed by: Eric DELENA Como, MD  Purpose: Therapeutic/Palliative Indications: Chronic neck pain (cervicalgia) severe enough to impact quality of life or function. Rationale (medical necessity): procedure needed and proper for the diagnosis and/or treatment of [name] medical symptoms and needs. Indications: 1. Cervicalgia   2. Cervical facet joint pain   3. Cervical facet syndrome (Bilateral)   4. Cervicogenic headache (Bilateral)   5. DDD (degenerative disc disease), cervical   6. Spondylosis without myelopathy or radiculopathy, cervical region   7. Chronic shoulder pain (Bilateral) (R>L)    Maria Spencer has been dealing with the above chronic pain for longer than three months and has either failed to  respond, was unable to tolerate, or simply did not get enough benefit from other more conservative therapies including, but not limited to: 1. Over-the-counter medications 2. Anti-inflammatory medications 3. Muscle relaxants 4. Membrane stabilizers 5. Opioids 6. Physical therapy and/or chiropractic manipulation 7. Modalities (Heat, ice, etc.) 8. Invasive techniques such as nerve blocks. [name] has attained more than 50% relief of the pain from a series of diagnostic injections conducted in separate occasions.  Pain Score: Pre-procedure: 5 /10 Post-procedure: 0-No pain/10    Effectiveness:  Initial hour after procedure:   ***. Subsequent 4-6 hours post-procedure:   ***. Analgesia past initial 6 hours:   ***. Ongoing improvement:  Analgesic:  *** Function:    ***    ROM:    ***     Pharmacotherapy Assessment   No opioid analgesics prescribed by our practice. Highest recorded MME/day: 75 mg/day MME/day: 0 mg/day   Monitoring:  PMP: PDMP reviewed during this encounter.       Pharmacotherapy: No side-effects or adverse reactions reported. Compliance: No problems identified. Effectiveness: Clinically acceptable.  No notes on file  UDS:  No results found for: SUMMARY  No results found for: CBDTHCR No results found for: D8THCCBX No results found for: D9THCCBX  ROS  Constitutional: Denies any fever or chills Gastrointestinal: No reported hemesis, hematochezia, vomiting, or acute GI distress Musculoskeletal: Denies any acute onset joint swelling, redness, loss of ROM, or weakness Neurological: No reported episodes of acute onset apraxia, aphasia, dysarthria, agnosia, amnesia, paralysis, loss of coordination, or loss of consciousness  Medication Review  Melatonin, Multi-Vitamins, amoxicillin -clavulanate, atorvastatin , ciprofloxacin , fluconazole , levothyroxine , metoprolol  succinate, and telmisartan   History Review  Allergy: [name] is allergic to sulfa  antibiotics, nitrofuran derivatives, amlodipine  besylate, and augmentin  [amoxicillin -pot clavulanate]. Drug: [name]  reports no history of drug use. Alcohol:  reports that she does not currently use alcohol. Tobacco:  reports that she has never smoked. She has never been exposed to tobacco smoke. She has never used smokeless tobacco. Social: [name]  reports that she has never smoked. She has never been exposed to tobacco smoke. She has  never used smokeless tobacco. She reports that she does not currently use alcohol. She reports that she does not use drugs. Medical:  has a past medical history of Arthritis, Breast cancer (HCC) (2009), Chicken pox, Chronic UTI, COVID-19, Diverticular disease, Diverticulitis, Esophagitis, GERD (gastroesophageal reflux disease), History of kidney stones, Hormone disorder, Hyperlipidemia, Hypertension, Hypothyroidism, LVH (left ventricular hypertrophy), MR (mitral regurgitation), Osteoporosis, PAC (premature atrial contraction), Personal history of radiation therapy (2009), PSVT (paroxysmal supraventricular tachycardia) (HCC), PVC (premature ventricular contraction), Sepsis (HCC), and Thyroid  disease. Surgical: [name]  has a past surgical history that includes Total shoulder replacement (Right, 2012); Shoulder surgery (Left, 2008, 2009); Tonsillectomy; Bone graft hip iliac crest (2018); Spine surgery (10/23/2017); Ventral hernia repair (N/A, 05/29/2018); Insertion of mesh (N/A, 05/29/2018); Cardiac catheterization (2001); Breast lumpectomy (Right, 2009); Breast biopsy (Left, 2009); Breast biopsy (Right, 2009); Colonoscopy with propofol  (N/A, 05/15/2020); Spinal fusion (07/24/2020); Cataract extraction, bilateral (Bilateral, 10/2019); Tubal ligation; Quadriceps tendon repair (Right, 12/05/2020); right gluteus medius/minimus repair Dr. Edie 11/2020 ; Total knee arthroplasty (Left, 03/18/2022); Hardware Removal (N/A, 03/12/2023); and Colon surgery (03/2023). Family: family  history includes AAA (abdominal aortic aneurysm) in her father; Arthritis in her father, mother, sister, and sister; Cancer in her brother and father; Coronary artery disease (age of onset: 13) in her mother; Diabetes in her father; Drug abuse in her daughter; Heart disease (age of onset: 89) in her mother; Hyperlipidemia in her mother, sister, and sister; Hypertension in her mother and sister; Mental retardation in her brother.  Laboratory Chemistry Profile   Renal Lab Results  Component Value Date   BUN 18 03/31/2024   CREATININE 0.54 (L) 03/31/2024   BCR 33 (H) 03/31/2024   GFR 91.09 05/08/2023   GFRAA >60 05/08/2020   GFRNONAA >60 04/20/2023    Hepatic Lab Results  Component Value Date   AST 29 03/31/2024   ALT 25 03/31/2024   ALBUMIN 4.5 03/31/2024   ALKPHOS 113 03/31/2024   LIPASE 26 03/15/2021    Electrolytes Lab Results  Component Value Date   NA 139 03/31/2024   K 4.7 03/31/2024   CL 99 03/31/2024   CALCIUM  9.7 03/31/2024   MG 2.0 05/08/2020    Bone Lab Results  Component Value Date   VD25OH 95.34 05/08/2023    Inflammation (CRP: Acute Phase) (ESR: Chronic Phase) Lab Results  Component Value Date   CRP 0.6 05/08/2020   ESRSEDRATE 5 05/08/2020   LATICACIDVEN 0.7 03/15/2021         Note: Above Lab results reviewed.  Recent Imaging Review  US  RENAL CLINICAL DATA:  Left flank pain in a patient with past medical history of nephrolithiasis  EXAM: RENAL / URINARY TRACT ULTRASOUND COMPLETE  COMPARISON:  CT of the abdomen and pelvis dated 02/12/2022  FINDINGS: Right Kidney:  Renal measurements: 9.9 cm x 4.9 cm x 5.0 cm = volume: 127.1 mL. 9.8 cm x 5.0 cm x 4.6 cm  Left Kidney:  Renal measurements: 116.6 = volume: mL. Echogenicity within normal limits. No mass or hydronephrosis visualized.  Bladder:  Appears normal for degree of bladder distention.  Other:  IMPRESSION: No hydronephrosis or nephrolithiasis bilaterally. Unremarkable for acute  abnormality.  Electronically Signed   By: Cordella Banner   On: 05/28/2024 12:50 Note: Reviewed        Physical Exam  Vitals: There were no vitals taken for this visit. BMI: Estimated body mass index is 28.07 kg/m as calculated from the following:   Height as of 05/18/24: 4' 11 (1.499 m).  Weight as of 05/18/24: 139 lb (63 kg). Ideal: Patient weight not recorded General appearance: Well nourished, well developed, and well hydrated. In no apparent acute distress Mental status: Alert, oriented x 3 (person, place, & time)       Respiratory: No evidence of acute respiratory distress Eyes: PERLA   Assessment   Diagnosis Status  1. Cervicalgia   2. Cervical facet joint pain   3. Cervical facet syndrome (Bilateral)   4. Cervicogenic headache (Bilateral)   5. Chronic shoulder pain (Bilateral) (R>L)   6. Spondylosis without myelopathy or radiculopathy, cervical region   7. Postop check    Controlled Controlled Controlled   Updated Problems: No problems updated.  Plan of Care  Problem-specific:  Assessment and Plan            Ms. Jeneal Maria Spencer has a current medication list which includes the following long-term medication(s): atorvastatin , levothyroxine , metoprolol  succinate, and telmisartan .  Pharmacotherapy (Medications Ordered): No orders of the defined types were placed in this encounter.  Orders:  No orders of the defined types were placed in this encounter.    Interventional Therapies  Risk Factors  Considerations:   Aortic atherosclerosis  osteoporosis  HTN  GERD  palpitations  prediabetes  SOB  Hx. of breast cancer   Planned  Pending:   Therapeutic right cervical FCT RFA #1   (02/05/2023) TENS ordered   Under consideration:   Diagnostic left inferolateral and recurrent genicular NB #1  Therapeutic left inferolateral and recurrent genicular nerve RFA #1  Diagnostic right T9-10 thoracic ESI #1  Diagnostic bilateral T9, T10, T11, and T12 MMB  thoracic facet RFA (DIFICULT CANDIDATE DUE TO HARDWARE)    Completed:   Diagnostic left genicular NB x1 (07/03/2021) (100/100/50/50)  Therapeutic left genicular nerve RFA x1 (08/09/2021) (100/100/30/0)  Diagnostic left cervical facet MBB x1 (10/26/2020) (100/100/0)  Diagnostic right cervical facet MBB x2 (03/25/2023) (100/100/50 x 3 wks/ 0)   Diagnostic bilateral suprascapular NB x1 (10/05/2020) (100/100/100 x1 day/0)  Diagnostic right cervical ESI x1 (09/14/2020) (100/100/100/90-100)  Diagnostic/therapeutic bilateral T9-T12 thoracic facet MBB x2 (06/06/2020) (100/100/0)  Diagnostic/Therapeutic right trapezoid bursa injection x1 (11/09/2020) (did not follow-up)    Therapeutic  Palliative (PRN) options:   Therapeutic/palliative bilateral T9-12 thoracic facet MBB #3  Therapeutic/palliative right trapezoid bursa injection #2      No follow-ups on file.    Recent Visits No visits were found meeting these conditions. Showing recent visits within past 90 days and meeting all other requirements Future Appointments Date Type Provider Dept  06/14/24 Appointment Tanya Glisson, MD [facility]  Showing future appointments within next 90 days and meeting all other requirements  I discussed the assessment and treatment plan with the patient. The patient was provided an opportunity to ask questions and all were answered. The patient agreed with the plan and demonstrated an understanding of the instructions.  Patient advised to call back or seek an in-person evaluation if the symptoms or condition worsens.  Duration of encounter: *** minutes.  Total time on encounter, as per AMA guidelines included both the face-to-face and non-face-to-face time personally spent by the physician and/or other qualified health care professional(s) on the day of the encounter (includes time in activities that require the physician or other qualified health care professional and does not include time in  activities normally performed by clinical staff). Physician's time may include the following activities when performed: Preparing to see the patient (e.g., pre-charting review of records, searching for previously ordered imaging, lab  work, and nerve conduction tests) Review of prior analgesic pharmacotherapies. Reviewing PMP Interpreting ordered tests (e.g., lab work, imaging, nerve conduction tests) Performing post-procedure evaluations, including interpretation of diagnostic procedures Obtaining and/or reviewing separately obtained history Performing a medically appropriate examination and/or evaluation Counseling and educating the patient/family/caregiver Ordering medications, tests, or procedures Referring and communicating with other health care professionals (when not separately reported) Documenting clinical information in the electronic or other health record Independently interpreting results (not separately reported) and communicating results to the patient/ family/caregiver Care coordination (not separately reported)  Note by: Eric DELENA Como, MD (TTS and AI technology used. I apologize for any typographical errors that were not detected and corrected.) Date: 06/14/2024; Time: 8:11 PM

## 2024-06-14 ENCOUNTER — Encounter: Payer: Self-pay | Admitting: Pain Medicine

## 2024-06-14 ENCOUNTER — Ambulatory Visit: Attending: Pain Medicine | Admitting: Pain Medicine

## 2024-06-14 VITALS — BP 162/71 | HR 70 | Temp 97.7°F | Resp 16 | Ht 59.0 in | Wt 140.0 lb

## 2024-06-14 DIAGNOSIS — G8929 Other chronic pain: Secondary | ICD-10-CM | POA: Diagnosis not present

## 2024-06-14 DIAGNOSIS — M4722 Other spondylosis with radiculopathy, cervical region: Secondary | ICD-10-CM | POA: Insufficient documentation

## 2024-06-14 DIAGNOSIS — M542 Cervicalgia: Secondary | ICD-10-CM | POA: Insufficient documentation

## 2024-06-14 DIAGNOSIS — M5412 Radiculopathy, cervical region: Secondary | ICD-10-CM | POA: Diagnosis not present

## 2024-06-14 DIAGNOSIS — G4486 Cervicogenic headache: Secondary | ICD-10-CM | POA: Insufficient documentation

## 2024-06-14 DIAGNOSIS — Z09 Encounter for follow-up examination after completed treatment for conditions other than malignant neoplasm: Secondary | ICD-10-CM | POA: Insufficient documentation

## 2024-06-14 DIAGNOSIS — M25512 Pain in left shoulder: Secondary | ICD-10-CM | POA: Diagnosis not present

## 2024-06-14 DIAGNOSIS — R937 Abnormal findings on diagnostic imaging of other parts of musculoskeletal system: Secondary | ICD-10-CM | POA: Insufficient documentation

## 2024-06-14 DIAGNOSIS — M25511 Pain in right shoulder: Secondary | ICD-10-CM | POA: Diagnosis not present

## 2024-06-14 DIAGNOSIS — M47812 Spondylosis without myelopathy or radiculopathy, cervical region: Secondary | ICD-10-CM | POA: Diagnosis not present

## 2024-06-14 NOTE — Patient Instructions (Signed)

## 2024-06-15 ENCOUNTER — Ambulatory Visit (HOSPITAL_BASED_OUTPATIENT_CLINIC_OR_DEPARTMENT_OTHER): Admitting: Pain Medicine

## 2024-06-15 ENCOUNTER — Ambulatory Visit
Admission: RE | Admit: 2024-06-15 | Discharge: 2024-06-15 | Disposition: A | Discharge status: Home / Self Care | Source: Ambulatory Visit | Attending: Pain Medicine | Admitting: Pain Medicine

## 2024-06-15 ENCOUNTER — Encounter: Payer: Self-pay | Admitting: Pain Medicine

## 2024-06-15 VITALS — BP 167/93 | HR 75 | Temp 97.6°F | Resp 17 | Ht 59.0 in | Wt 140.0 lb

## 2024-06-15 DIAGNOSIS — M542 Cervicalgia: Secondary | ICD-10-CM

## 2024-06-15 DIAGNOSIS — G4486 Cervicogenic headache: Secondary | ICD-10-CM

## 2024-06-15 DIAGNOSIS — R937 Abnormal findings on diagnostic imaging of other parts of musculoskeletal system: Secondary | ICD-10-CM | POA: Diagnosis not present

## 2024-06-15 DIAGNOSIS — G8929 Other chronic pain: Secondary | ICD-10-CM | POA: Insufficient documentation

## 2024-06-15 DIAGNOSIS — M4722 Other spondylosis with radiculopathy, cervical region: Secondary | ICD-10-CM

## 2024-06-15 DIAGNOSIS — M5481 Occipital neuralgia: Secondary | ICD-10-CM | POA: Insufficient documentation

## 2024-06-15 DIAGNOSIS — M5412 Radiculopathy, cervical region: Secondary | ICD-10-CM | POA: Diagnosis not present

## 2024-06-15 DIAGNOSIS — M5030 Other cervical disc degeneration, unspecified cervical region: Secondary | ICD-10-CM | POA: Diagnosis not present

## 2024-06-15 DIAGNOSIS — M79601 Pain in right arm: Secondary | ICD-10-CM | POA: Insufficient documentation

## 2024-06-15 MED ORDER — FENTANYL CITRATE (PF) 100 MCG/2ML IJ SOLN: 25.0000 ug | INTRAMUSCULAR | Status: DC | PRN

## 2024-06-15 MED ORDER — MIDAZOLAM HCL 5 MG/5ML IJ SOLN: 0.5000 mg | Freq: Once | INTRAMUSCULAR | Status: DC

## 2024-06-15 MED ORDER — SODIUM CHLORIDE 0.9% FLUSH
1.0000 mL | Freq: Once | INTRAVENOUS | Status: AC
  Administered 2024-06-15: 1 mL

## 2024-06-15 MED ORDER — IOHEXOL 180 MG/ML  SOLN
10.0000 mL | Freq: Once | INTRAMUSCULAR | Status: AC
  Administered 2024-06-15: 10 mL via EPIDURAL
  Filled 2024-06-15: qty 20

## 2024-06-15 MED ORDER — DEXAMETHASONE SODIUM PHOSPHATE 10 MG/ML IJ SOLN
10.0000 mg | Freq: Once | INTRAMUSCULAR | Status: AC
  Administered 2024-06-15: 10 mg
  Filled 2024-06-15: qty 1

## 2024-06-15 MED ORDER — LIDOCAINE HCL 2 % IJ SOLN
20.0000 mL | Freq: Once | INTRAMUSCULAR | Status: AC
  Administered 2024-06-15: 400 mg
  Filled 2024-06-15: qty 20

## 2024-06-15 MED ORDER — ROPIVACAINE HCL 2 MG/ML IJ SOLN
1.0000 mL | Freq: Once | INTRAMUSCULAR | Status: AC
  Administered 2024-06-15: 1 mL via EPIDURAL
  Filled 2024-06-15: qty 20

## 2024-06-15 MED ORDER — PENTAFLUOROPROP-TETRAFLUOROETH EX AERO
INHALATION_SPRAY | Freq: Once | CUTANEOUS | Status: AC
  Administered 2024-06-15: 30 via TOPICAL

## 2024-06-15 NOTE — Patient Instructions (Signed)

## 2024-06-15 NOTE — Progress Notes (Signed)
 Safety precautions to be maintained throughout the outpatient stay will include: orient to surroundings, keep bed in low position, maintain call bell within reach at all times, provide assistance with transfer out of bed and ambulation.

## 2024-06-15 NOTE — Progress Notes (Signed)
 PROVIDER NOTE: Interpretation of information contained herein should be left to medically-trained personnel. Specific patient instructions are provided elsewhere under Patient Instructions section of medical record. This document was created in part using STT-dictation technology, any transcriptional errors that may result from this process are unintentional.  Patient: Maddi Collar Type: Established DOB: 05-Jul-1946 MRN: [id number] PCP: No primary care provider on file.  Service: Procedure DOS: 06/15/2024 Setting: Ambulatory Location: Ambulatory outpatient facility Delivery: Face-to-face Provider: Eric DELENA Como, MD Specialty: Interventional Pain Management Specialty designation: 09 Location: Outpatient facility Ref. Prov.: No ref. provider found       Interventional Therapy   Type: Cervical Epidural Steroid injection (CESI) (Interlaminar) #2  Laterality: Right (-RT)  Level: C7-T1 DOS: 06/15/2024  Provider: Eric DELENA Como, MD Imaging: Fluoroscopy-guided Spinal ([id number]) Anesthesia: Local anesthesia (1-2% Lidocaine ) Anxiolysis: None                 Sedation: No Sedation                        Medical Necessity Purpose: Diagnostic/Therapeutic Rationale (medical necessity): procedure needed and proper for the diagnosis and/or treatment of Ms. Mandujano's medical symptoms and needs. Indications: Cervicalgia, cervical radicular pain, degenerative disc disease, severe enough to impact quality of life or function. 1. Cervicalgia   2. Cervicogenic headache (Bilateral)   3. Cervico-occipital neuralgia (Bilateral)   4. Chronic upper extremity pain (Right)   5. DDD (degenerative disc disease), cervical   6. Cervical radiculopathy   7. Cervical radiculopathy at C7 (Right)   8. Cervical spondylosis with radiculopathy (Right)   9. Abnormal MRI, cervical spine (08/06/2022)    NAS-11 Pain score:   Pre-procedure: 5 /10   Post-procedure: 5 /10     Position  Prep  Materials:   Location setting: Procedure suite Position: Prone, on modified reverse trendelenburg to facilitate breathing, with head in head-cradle. Pillows positioned under chest (below chin-level) with cervical spine flexed. Safety Precautions: Patient was assessed for positional comfort and pressure points before starting the procedure. Prepping solution: DuraPrep (Iodine  Povacrylex [0.7% available iodine ] and Isopropyl Alcohol, 74% w/w) Prep Area: Entire  cervicothoracic region Approach: percutaneous, paramedial Intended target: Posterior cervical epidural space Materials Procedure:  Tray: Epidural Needle(s): Epidural (Tuohy) Qty: 1 Length: (90mm) 3.5-inch Gauge: 17G  H&P (Pre-op Assessment):  Ms. Guizar is a 77 y.o. (year old), female patient, seen today for interventional treatment. She  has a past surgical history that includes Total shoulder replacement (Right, 2012); Shoulder surgery (Left, 2008, 2009); Tonsillectomy; Bone graft hip iliac crest (2018); Spine surgery (10/23/2017); Ventral hernia repair (N/A, 05/29/2018); Insertion of mesh (N/A, 05/29/2018); Cardiac catheterization (2001); Breast lumpectomy (Right, 2009); Breast biopsy (Left, 2009); Breast biopsy (Right, 2009); Colonoscopy with propofol  (N/A, 05/15/2020); Spinal fusion (07/24/2020); Cataract extraction, bilateral (Bilateral, 10/2019); Tubal ligation; Quadriceps tendon repair (Right, 12/05/2020); right gluteus medius/minimus repair Dr. Edie 11/2020 ; Total knee arthroplasty (Left, 03/18/2022); Hardware Removal (N/A, 03/12/2023); and Colon surgery (03/2023). Ms. Sailer has a current medication list which includes the following prescription(s): levothyroxine , melatonin, metoprolol  succinate, multi-vitamins, and telmisartan . Her primarily concern today is the Neck Pain  Initial Vital Signs:  Pulse/HCG Rate: 75ECG Heart Rate: 81 Temp: 97.6 F (36.4 C) Resp: 16 BP: (!) 145/92 SpO2: 99 %  BMI: Estimated body mass index is 28.28 kg/m as  calculated from the following:   Height as of this encounter: 4' 11 (1.499 m).   Weight as of this encounter: 140 lb (63.5 kg).  Risk Assessment:  Allergies: Reviewed. She is allergic to sulfa antibiotics, nitrofuran derivatives, amlodipine  besylate, and augmentin  [amoxicillin -pot clavulanate].  Allergy Precautions: None required Coagulopathies: Reviewed. None identified.  Blood-thinner therapy: None at this time Active Infection(s): Reviewed. None identified. Ms. Grothaus is afebrile  Site Confirmation: Ms. Waller was asked to confirm the procedure and laterality before marking the site Procedure checklist: Completed Consent: Before the procedure and under the influence of no sedative(s), amnesic(s), or anxiolytics, the patient was informed of the treatment options, risks and possible complications. To fulfill our ethical and legal obligations, as recommended by the American Medical Association's Code of Ethics, I have informed the patient of my clinical impression; the nature and purpose of the treatment or procedure; the risks, benefits, and possible complications of the intervention; the alternatives, including doing nothing; the risk(s) and benefit(s) of the alternative treatment(s) or procedure(s); and the risk(s) and benefit(s) of doing nothing. The patient was provided information about the general risks and possible complications associated with the procedure. These may include, but are not limited to: failure to achieve desired goals, infection, bleeding, organ or nerve damage, allergic reactions, paralysis, and death. In addition, the patient was informed of those risks and complications associated to Spine-related procedures, such as failure to decrease pain; infection (i.e.: Meningitis, epidural or intraspinal abscess); bleeding (i.e.: epidural hematoma, subarachnoid hemorrhage, or any other type of intraspinal or peri-dural bleeding); organ or nerve damage (i.e.: Any type of peripheral  nerve, nerve root, or spinal cord injury) with subsequent damage to sensory, motor, and/or autonomic systems, resulting in permanent pain, numbness, and/or weakness of one or several areas of the body; allergic reactions; (i.e.: anaphylactic reaction); and/or death. Furthermore, the patient was informed of those risks and complications associated with the medications. These include, but are not limited to: allergic reactions (i.e.: anaphylactic or anaphylactoid reaction(s)); adrenal axis suppression; blood sugar elevation that in diabetics may result in ketoacidosis or comma; water  retention that in patients with history of congestive heart failure may result in shortness of breath, pulmonary edema, and decompensation with resultant heart failure; weight gain; swelling or edema; medication-induced neural toxicity; particulate matter embolism and blood vessel occlusion with resultant organ, and/or nervous system infarction; and/or aseptic necrosis of one or more joints. Finally, the patient was informed that Medicine is not an exact science; therefore, there is also the possibility of unforeseen or unpredictable risks and/or possible complications that may result in a catastrophic outcome. The patient indicated having understood very clearly. We have given the patient no guarantees and we have made no promises. Enough time was given to the patient to ask questions, all of which were answered to the patient's satisfaction. Ms. Hauger has indicated that she wanted to continue with the procedure. Attestation: I, the ordering provider, attest that I have discussed with the patient the benefits, risks, side-effects, alternatives, likelihood of achieving goals, and potential problems during recovery for the procedure that I have provided informed consent. Date  Time: 06/15/2024 12:46 PM  Pre-Procedure Preparation:  Monitoring: As per clinic protocol. Respiration, ETCO2, SpO2, BP, heart rate and rhythm monitor placed  and checked for adequate function Safety Precautions: Patient was assessed for positional comfort and pressure points before starting the procedure. Time-out: I initiated and conducted the Time-out before starting the procedure, as per protocol. The patient was asked to participate by confirming the accuracy of the Time Out information. Verification of the correct person, site, and procedure were performed and confirmed by me, the nursing staff, and the patient. Time-out conducted  as per Joint Commission's Universal Protocol (UP.01.01.01). Time: 1306 Start Time: 1306 hrs.  Description  Narrative of Procedure:          Rationale (medical necessity): procedure needed and proper for the diagnosis and/or treatment of the patient's medical symptoms and needs. Start Time: 1306 hrs. Safety Precautions: Aspiration looking for blood return was conducted prior to all injections. At no point did we inject any substances, as a needle was being advanced. No attempts were made at seeking any paresthesias. Safe injection practices and needle disposal techniques used. Medications properly checked for expiration dates. SDV (single dose vial) medications used. Description of procedure: Protocol guidelines were followed. The patient was assisted into a comfortable position. The target area was identified and the area prepped in the usual manner. Skin & deeper tissues infiltrated with local anesthetic. Appropriate amount of time allowed to pass for local anesthetics to take effect. Using fluoroscopic guidance, the epidural needle was introduced through the skin, ipsilateral to the reported pain, and advanced to the target area. Posterior laminar os was contacted and the needle walked caudad, until the lamina was cleared. The ligamentum flavum was engaged and the epidural space identified using "loss-of-resistance technique" with 2-3 ml of PF-NaCl (0.9% NSS), in a 5cc dedicated LOR syringe. (See Imaging guidance below  for use of contrast details.) Once proper needle placement was secured, and negative aspiration confirmed, the solution was injected in intermittent fashion, asking for systemic symptoms every 0.5cc. The needles were then removed and the area cleansed, making sure to leave some of the prepping solution back to take advantage of its long term bactericidal properties.  Vitals:   06/15/24 1244 06/15/24 1305  BP: (!) 145/92 (!) 167/93  Pulse: 75   Resp: 16 17  Temp: 97.6 F (36.4 C)   SpO2: 99% 99%  Weight: 140 lb (63.5 kg)   Height: 4' 11 (1.499 m)      End Time:   hrs.  Imaging Guidance (Spinal):          Type of Imaging Technique: Fluoroscopy Guidance (Spinal) Indication(s): Fluoroscopy guidance for needle placement to enhance accuracy in procedures requiring precise needle localization for targeted delivery of medication in or near specific anatomical locations not easily accessible without such real-time imaging assistance. Exposure Time: Please see nurses notes. Contrast: Before injecting any contrast, we confirmed that the patient did not have an allergy to iodine , shellfish, or radiological contrast. Once satisfactory needle placement was completed at the desired level, radiological contrast was injected. Contrast injected under live fluoroscopy. No contrast complications. See chart for type and volume of contrast used. Fluoroscopic Guidance: I was personally present during the use of fluoroscopy. Tunnel Vision Technique used to obtain the best possible view of the target area. Parallax error corrected before commencing the procedure. Direction-depth-direction technique used to introduce the needle under continuous pulsed fluoroscopy. Once target was reached, antero-posterior, oblique, and lateral fluoroscopic projection used confirm needle placement in all planes. Images permanently stored in EMR. Interpretation: I personally interpreted the imaging intraoperatively. Adequate needle  placement confirmed in multiple planes. Appropriate spread of contrast into desired area was observed. No evidence of afferent or efferent intravascular uptake. No intrathecal or subarachnoid spread observed. Permanent images saved into the patient's record.  Post-operative Assessment:  Post-procedure Vital Signs:  Pulse/HCG Rate: 7581 Temp: 97.6 F (36.4 C) Resp: 17 BP: (!) 167/93 SpO2: 99 %  EBL: None  Complications: No immediate post-treatment complications observed by team, or reported by patient.  Note: The  patient tolerated the entire procedure well. A repeat set of vitals were taken after the procedure and the patient was kept under observation following institutional policy, for this type of procedure. Post-procedural neurological assessment was performed, showing return to baseline, prior to discharge. The patient was provided with post-procedure discharge instructions, including a section on how to identify potential problems. Should any problems arise concerning this procedure, the patient was given instructions to immediately contact us , at any time, without hesitation. In any case, we plan to contact the patient by telephone for a follow-up status report regarding this interventional procedure.  Comments:  No additional relevant information.  Plan of Care (POC)  Orders:  Orders Placed This Encounter  Procedures   Cervical Epidural Injection    Indication(s): Radiculitis and cervicalgia associated with cervical degenerative disc disease. Position: Prone Imaging guidance: Fluoroscopy required. Contrast required unless contraindicated by allergy or severe CKD. Equipment & Materials: Epidural tray & needle.    Scheduling Instructions:     Procedure: Cervical Epidural Steroid Injection/Block     Planned Level(s): C7-T1     Laterality: Right-sided     Anxiolysis: Patient's choice.     Date: 06/15/2024    Where will this procedure be performed?:   [facility]              by Dr. T[facility] 1-60 MIN NO REPORT    Intraoperative interpretation by procedural physician at [facility].    Standing Status:   Standing    Number of Occurrences:   1    Reason for exam::   Assistance in needle guidance and placement for procedures requiring needle placement in or near specific anatomical locations not easily accessible without such assistance.   Informed Consent Details: Physician/Practitioner Attestation; Transcribe to consent form and obtain patient signature    Nursing instructions: Transcribe to consent form and obtain patient signature. Always confirm laterality of pain with Ms. Insco, before procedure.    Physician/Practitioner attestation of informed consent for procedure/surgical case:   I, the physician/practitioner, attest that I have discussed with the patient the benefits, risks, side effects, alternatives, likelihood of achieving goals and potential problems during recovery for the procedure that I have provided informed consent.    Procedure:   Cervical Epidural Steroid Injection (CESI) under fluoroscopic guidance    Physician/Practitioner performing the procedure:   Tanae Petrosky A. Tanya MD    Indication/Reason:   Indications: Cervicalgia (neck pain), cervical radicular pain, radiculitis (arm/shoulder pain, numbness, and/or weakness), degenerative disc disease, severe enough to greatly impact quality of life or function.   Provide equipment / supplies at bedside    Procedural tray: Epidural Tray (Disposable  single use) Skin infiltration needle: Regular 1.5-in, 25-G, (x1) Block needle size: Regular standard Catheter: No catheter required    Standing Status:   Standing    Number of Occurrences:   1    Specify:   Epidural Tray     Opioid Analgesic: No opioid analgesics prescribed by our practice. MME/day: 0 mg/day    Medications ordered for procedure: Meds ordered this encounter  Medications   iohexol  (OMNIPAQUE ) 180  MG/ML injection 10 mL    Must be Myelogram-compatible. If not available, you may substitute with a water -soluble, non-ionic, hypoallergenic, myelogram-compatible radiological contrast medium.   lidocaine  (XYLOCAINE ) 2 % (with pres) injection 400 mg   pentafluoroprop-tetrafluoroeth (GEBAUERS) aerosol   DISCONTD: midazolam  (VERSED ) 5 MG/5ML injection 0.5-2 mg    Make sure Flumazenil is  available in the pyxis when using this medication. If oversedation occurs, administer 0.2 mg IV over 15 sec. If after 45 sec no response, administer 0.2 mg again over 1 min; may repeat at 1 min intervals; not to exceed 4 doses (1 mg)   DISCONTD: fentaNYL  (SUBLIMAZE ) injection 25-50 mcg    Make sure Narcan is available in the pyxis when using this medication. In the event of respiratory depression (RR< 8/min): Titrate NARCAN (naloxone) in increments of 0.1 to 0.2 mg IV at 2-3 minute intervals, until desired degree of reversal.   sodium chloride  flush (NS) 0.9 % injection 1 mL   ropivacaine  (PF) 2 mg/mL (0.2%) (NAROPIN ) injection 1 mL   dexamethasone  (DECADRON ) injection 10 mg   Medications administered: We administered iohexol , lidocaine , pentafluoroprop-tetrafluoroeth, sodium chloride  flush, ropivacaine  (PF) 2 mg/mL (0.2%), and dexamethasone .  See the medical record for exact dosing, route, and time of administration.    Interventional Therapies  Risk Factors  Considerations:   Aortic atherosclerosis  osteoporosis  HTN  GERD  palpitations  prediabetes  SOB  Hx. of breast cancer   Planned  Pending:   Therapeutic right cervical ESI #2  (02/05/2023) TENS ordered   Under consideration:   Diagnostic left inferolateral and recurrent genicular NB #1  Therapeutic left inferolateral and recurrent genicular nerve RFA #1  Diagnostic right T9-10 thoracic ESI #1  Diagnostic bilateral T9, T10, T11, and T12 MMB thoracic facet RFA (DIFICULT CANDIDATE DUE TO HARDWARE)    Completed:   Therapeutic left cervical  facet MB (3-C7) RFA x1 (09/09/2023) (100/100/50/20)  Therapeutic right cervical facet MB (3-C7) RFA x1 (08/05/2023) (100/100/50/50)  Diagnostic left genicular NB x1 (07/03/2021) (100/100/50/50)  Therapeutic left genicular nerve RFA x1 (08/09/2021) (100/100/30/0)  Diagnostic left cervical facet MBB x1 (10/26/2020) (100/100/0)  Diagnostic right cervical facet MBB x2 (03/25/2023) (100/100/50 x 3 wks/ 0)   Diagnostic bilateral suprascapular NB x1 (10/05/2020) (100/100/100 x1 day/0)  Diagnostic right cervical ESI x1 (09/14/2020) (100/100/100/90-100)  Diagnostic/therapeutic bilateral T9-T12 thoracic facet MBB x2 (06/06/2020) (100/100/0)  Diagnostic/Therapeutic right trapezoid bursa injection x1 (11/09/2020) (did not follow-up)    Therapeutic  Palliative (PRN) options:   Therapeutic/palliative bilateral T9-12 thoracic facet MBB #3  Therapeutic/palliative right trapezoid bursa injection #2       Follow-up plan:   Return in about 2 weeks (around 06/29/2024) for (Face2F), (PPE).     Recent Visits Date Type Provider Dept  06/14/24 Office Visit Tanya Glisson, MD [facility]  Showing recent visits within past 90 days and meeting all other requirements Today's Visits Date Type Provider Dept  06/15/24 Procedure visit Tanya Glisson, MD [facility]  Showing today's visits and meeting all other requirements Future Appointments No visits were found meeting these conditions. Showing future appointments within next 90 days and meeting all other requirements   Disposition: Discharge home  Discharge (Date  Time): 06/15/2024;   hrs.   Primary Care Physician: No primary care provider on file. Location: [facility] Note by: Glisson DELENA Tanya, MD (TTS technology used. I apologize for any typographical errors that were not detected and corrected.) Date: 06/15/2024; Time: 1:12 PM  Disclaimer:  Medicine is not an Visual merchandiser. The only guarantee in  medicine is that nothing is guaranteed. It is important to note that the decision to proceed with this intervention was based on the information collected from the patient. The Data and conclusions were drawn from the patient's questionnaire, the interview, and the physical examination. Because the information was  provided in large part by the patient, it cannot be guaranteed that it has not been purposely or unconsciously manipulated. Every effort has been made to obtain as much relevant data as possible for this evaluation. It is important to note that the conclusions that lead to this procedure are derived in large part from the available data. Always take into account that the treatment will also be dependent on availability of resources and existing treatment guidelines, considered by other Pain Management Practitioners as being common knowledge and practice, at the time of the intervention. For Medico-Legal purposes, it is also important to point out that variation in procedural techniques and pharmacological choices are the acceptable norm. The indications, contraindications, technique, and results of the above procedure should only be interpreted and judged by a Board-Certified Interventional Pain Specialist with extensive familiarity and expertise in the same exact procedure and technique.

## 2024-06-16 ENCOUNTER — Telehealth: Payer: Self-pay

## 2024-06-16 NOTE — Telephone Encounter (Signed)
Post procedure follow up.  Patient states she is doing great.

## 2024-06-18 DIAGNOSIS — M810 Age-related osteoporosis without current pathological fracture: Secondary | ICD-10-CM | POA: Diagnosis not present

## 2024-06-22 ENCOUNTER — Ambulatory Visit
Admission: RE | Admit: 2024-06-22 | Discharge: 2024-06-22 | Disposition: A | Discharge status: Home / Self Care | Source: Ambulatory Visit | Attending: Internal Medicine | Admitting: Internal Medicine

## 2024-06-22 DIAGNOSIS — Z1231 Encounter for screening mammogram for malignant neoplasm of breast: Secondary | ICD-10-CM | POA: Diagnosis not present

## 2024-06-24 DIAGNOSIS — M810 Age-related osteoporosis without current pathological fracture: Secondary | ICD-10-CM | POA: Diagnosis not present

## 2024-06-24 DIAGNOSIS — Z1331 Encounter for screening for depression: Secondary | ICD-10-CM | POA: Diagnosis not present

## 2024-06-29 NOTE — Progress Notes (Unsigned)
 PROVIDER NOTE: Interpretation of information contained herein should be left to medically-trained personnel. Specific patient instructions are provided elsewhere under Patient Instructions section of medical record. This document was created in part using AI and STT-dictation technology, any transcriptional errors that may result from this process are unintentional.  Patient: Maria Spencer  Service: E/M   PCP: Abbey Bruckner, MD  DOB: July 06, 1946  DOS: 06/30/2024  Provider: Eric DELENA Como, MD  MRN: [id number]  Delivery: Face-to-face  Specialty: Interventional Pain Management  Type: Established Patient  Setting: Ambulatory outpatient facility  Specialty designation: 09  Referring Prov.: No ref. provider found  Location: Outpatient office facility       History of present illness (HPI) [name], a 78 y.o. year old female, is here today because of her Cervicalgia [M54.2]. [name] primary complain today is No chief complaint on file.  Pertinent problems: [name] has History of breast cancer; Arthritis; Chronic hip pain (2ry area of Pain) (Bilateral) (R>L); Chronic thoracic back pain (1ry area of Pain) (Bilateral) (R>L); Fusion of spine of lumbosacral region; Chronic pain syndrome; Chronic lower extremity pain (3ry area of Pain) (Right); Spondylosis without myelopathy or radiculopathy, thoracic region; Closed wedge compression fracture of T8 vertebra (HCC); Osteoarthritis of hips (Bilateral); Abnormal thoracolumbar CT myelogram (01/05/2020); 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); DDD (degenerative disc disease), cervical; DDD (degenerative disc disease), thoracic; DDD (degenerative disc disease), lumbar; Displacement of thoracic intervertebral disc (C6-T3, T4-5, T6-7, T8-9); Non-traumatic compression fracture of T8 thoracic vertebra, sequela; Cervicalgia; Chronic upper extremity pain (Right); Cervical spondylosis with radiculopathy (Right); Cervical radiculopathy at C7 (Right);  Chronic shoulder pain (Bilateral) (R>L); Chronic shoulder pain after shoulder replacement (Right); Osteoarthritis of AC (acromioclavicular) joints (Bilateral); Chronic Acromioclavicular (AC) joint pain (Bilateral); Osteoarthritis of glenohumeral joint (Left); Tear of right gluteus minimus tendon; Abnormal MRI, cervical spine (08/06/2022); Cervical facet syndrome (Bilateral); Cervicogenic headache (Bilateral); Occipital neuralgia (greater occipital nerve) (Bilateral); Cervico-occipital neuralgia (Bilateral); Cervical facet hypertrophy (Multilevel) (Bilateral); Spondylosis without myelopathy or radiculopathy, cervical region; Scoliosis of thoracic spine, unspecified scoliosis type; 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/20/2017); Chronic bursitis of shoulder area (Right); Chronic shoulder pain (Right); Shoulder blade pain (Right); Tear of right gluteus medius tendon; Osteoarthritis of knee (Left); Partial tear of right hamstring; Localized osteoarthritis of left knee; Weakness of both lower extremities; Failed orthopedic implant (HCC); S/P spinal fusion; S/P hardware removal; Cervical facet joint pain; Mechanical low back pain; Acquired spondylolisthesis; Arthritis of right knee; Full thickness rotator cuff tear; Spinal stenosis of lumbar region; Tendinopathy of left gluteus medius; Trochanteric bursitis of right hip; Idiopathic peripheral neuropathy; Cervical radiculopathy; Degeneration of lumbar intervertebral disc; Lumbar spondylosis; Lumbosacral radiculitis; Lumbosacral radiculopathy; Lumbosacral spondylosis without myelopathy; and Degenerative joint disease of shoulder region on their pertinent problem list.  Pain Assessment: Severity of   is reported as a  /10. Location:    / . Onset:  . Quality:  . Timing:  . Modifying factor(s):  SABRA Vitals:  vitals were not taken for this visit.  BMI: Estimated body mass index is 28.28 kg/m as calculated from the following:   Height as of 06/15/24: 4' 11  (1.499 m).   Weight as of 06/15/24: 140 lb (63.5 kg).  Last encounter: 06/14/2024. Last procedure: 06/15/2024.  Reason for encounter: post-procedure evaluation and assessment.   Discussed the use of AI scribe software for clinical note transcription with the patient, who gave verbal consent to proceed.  History of Present Illness  Post-Procedure Evaluation   Type: Cervical Epidural Steroid injection (CESI) (Interlaminar) #2  Laterality: Right (-RT)  Level: C7-T1 DOS: 06/15/2024  Provider: Eric DELENA Como, MD Imaging: Fluoroscopy-guided Spinal ([id number]) Anesthesia: Local anesthesia (1-2% Lidocaine ) Anxiolysis: None                 Sedation: No Sedation                        Medical Necessity Purpose: Diagnostic/Therapeutic Rationale (medical necessity): procedure needed and proper for the diagnosis and/or treatment of Ms. Fuentes's medical symptoms and needs. Indications: Cervicalgia, cervical radicular pain, degenerative disc disease, severe enough to impact quality of life or function. 1. Cervicalgia   2. Cervicogenic headache (Bilateral)   3. Cervico-occipital neuralgia (Bilateral)   4. Chronic upper extremity pain (Right)   5. DDD (degenerative disc disease), cervical   6. Cervical radiculopathy   7. Cervical radiculopathy at C7 (Right)   8. Cervical spondylosis with radiculopathy (Right)   9. Abnormal MRI, cervical spine (08/06/2022)    NAS-11 Pain score:   Pre-procedure: 5 /10   Post-procedure: 5 /10     Effectiveness:  Initial hour after procedure:   ***. Subsequent 4-6 hours post-procedure:   ***. Analgesia past initial 6 hours:   ***. Ongoing improvement:  Analgesic:  *** Function:    ***    ROM:    ***     Pharmacotherapy Assessment   Opioid Analgesic: No opioid analgesics prescribed by our practice. MME/day: 0 mg/day   Monitoring: [place] PMP: PDMP reviewed during this encounter.       Pharmacotherapy: No side-effects or adverse reactions  reported. Compliance: No problems identified. Effectiveness: Clinically acceptable.  No notes on file  UDS:  No results found for: SUMMARY  No results found for: CBDTHCR No results found for: D8THCCBX No results found for: D9THCCBX  ROS  Constitutional: Denies any fever or chills Gastrointestinal: No reported hemesis, hematochezia, vomiting, or acute GI distress Musculoskeletal: Denies any acute onset joint swelling, redness, loss of ROM, or weakness Neurological: No reported episodes of acute onset apraxia, aphasia, dysarthria, agnosia, amnesia, paralysis, loss of coordination, or loss of consciousness  Medication Review  Melatonin, Multi-Vitamins, levothyroxine , metoprolol  succinate, and telmisartan   History Review  Allergy: [name] is allergic to sulfa antibiotics, nitrofuran derivatives, amlodipine  besylate, and augmentin  [amoxicillin -pot clavulanate]. Drug: [name]  reports no history of drug use. Alcohol:  reports that she does not currently use alcohol. Tobacco:  reports that she has never smoked. She has never been exposed to tobacco smoke. She has never used smokeless tobacco. Social: [name]  reports that she has never smoked. She has never been exposed to tobacco smoke. She has never used smokeless tobacco. She reports that she does not currently use alcohol. She reports that she does not use drugs. Medical:  has a past medical history of Arthritis, Breast cancer (HCC) (2009), Chicken pox, Chronic UTI, COVID-19, Diverticular disease, Diverticulitis, Esophagitis, GERD (gastroesophageal reflux disease), History of kidney stones, Hormone disorder, Hyperlipidemia, Hypertension, Hypothyroidism, LVH (left ventricular hypertrophy), MR (mitral regurgitation), Osteoporosis, PAC (premature atrial contraction), Personal history of radiation therapy (2009), PSVT (paroxysmal supraventricular tachycardia) (HCC), PVC (premature ventricular contraction), Sepsis (HCC), and Thyroid   disease. Surgical: Ms. Even  has a past surgical history that includes Total shoulder replacement (Right, 2012); Shoulder surgery (Left, 2008, 2009); Tonsillectomy; Bone graft hip iliac crest (2018); Spine surgery (10/23/2017); Ventral hernia repair (N/A, 05/29/2018); Insertion of mesh (N/A, 05/29/2018); Cardiac catheterization (2001);  Breast lumpectomy (Right, 2009); Breast biopsy (Left, 2009); Breast biopsy (Right, 2009); Colonoscopy with propofol  (N/A, 05/15/2020); Spinal fusion (07/24/2020); Cataract extraction, bilateral (Bilateral, 10/2019); Tubal ligation; Quadriceps tendon repair (Right, 12/05/2020); right gluteus medius/minimus repair Dr. Edie 11/2020 ; Total knee arthroplasty (Left, 03/18/2022); Hardware Removal (N/A, 03/12/2023); and Colon surgery (03/2023). Family: family history includes AAA (abdominal aortic aneurysm) in her father; Arthritis in her father, mother, sister, and sister; Cancer in her brother and father; Coronary artery disease (age of onset: 64) in her mother; Diabetes in her father; Drug abuse in her daughter; Heart disease (age of onset: 24) in her mother; Hyperlipidemia in her mother, sister, and sister; Hypertension in her mother and sister; Mental retardation in her brother.  Laboratory Chemistry Profile   Renal Lab Results  Component Value Date   BUN 18 03/31/2024   CREATININE 0.54 (L) 03/31/2024   BCR 33 (H) 03/31/2024   GFR 91.09 05/08/2023   GFRAA >60 05/08/2020   GFRNONAA >60 04/20/2023    Hepatic Lab Results  Component Value Date   AST 29 03/31/2024   ALT 25 03/31/2024   ALBUMIN 4.5 03/31/2024   ALKPHOS 113 03/31/2024   LIPASE 26 03/15/2021    Electrolytes Lab Results  Component Value Date   NA 139 03/31/2024   K 4.7 03/31/2024   CL 99 03/31/2024   CALCIUM  9.7 03/31/2024   MG 2.0 05/08/2020    Bone Lab Results  Component Value Date   VD25OH 95.34 05/08/2023    Inflammation (CRP: Acute Phase) (ESR: Chronic Phase) Lab Results  Component  Value Date   CRP 0.6 05/08/2020   ESRSEDRATE 5 05/08/2020   LATICACIDVEN 0.7 03/15/2021         Note: Above Lab results reviewed.  Recent Imaging Review  MM 3D SCREENING MAMMOGRAM BILATERAL BREAST CLINICAL DATA:  Screening.  EXAM: DIGITAL SCREENING BILATERAL MAMMOGRAM WITH TOMOSYNTHESIS AND CAD  TECHNIQUE: Bilateral screening digital craniocaudal and mediolateral oblique mammograms were obtained. Bilateral screening digital breast tomosynthesis was performed. The images were evaluated with computer-aided detection.  COMPARISON:  Previous exam(s).  ACR Breast Density Category c: The breasts are heterogeneously dense, which may obscure small masses.  FINDINGS: There are no findings suspicious for malignancy.  IMPRESSION: No mammographic evidence of malignancy. A result letter of this screening mammogram will be mailed directly to the patient.  RECOMMENDATION: Screening mammogram in one year. (Code:SM-B-01Y)  BI-RADS CATEGORY  1: Negative.  Electronically Signed   By: Toribio Agreste M.D.   On: 06/24/2024 10:42 Note: Reviewed        Physical Exam  Vitals: There were no vitals taken for this visit. BMI: Estimated body mass index is 28.28 kg/m as calculated from the following:   Height as of 06/15/24: 4' 11 (1.499 m).   Weight as of 06/15/24: 140 lb (63.5 kg). Ideal: Patient must be at least 60 in tall to calculate ideal body weight General appearance: Well nourished, well developed, and well hydrated. In no apparent acute distress Mental status: Alert, oriented x 3 (person, place, & time)       Respiratory: No evidence of acute respiratory distress Eyes: PERLA   Assessment   Diagnosis Status  1. Cervicalgia   2. Cervicogenic headache (Bilateral)   3. Chronic upper extremity pain (Right)   4. Cervical radiculopathy   5. Postop check    Controlled Controlled Controlled   Updated Problems: No problems updated.  Plan of Care  Problem-specific:   Assessment and Plan  [name] has a current medication list which includes the following long-term medication(s): levothyroxine , metoprolol  succinate, and telmisartan .  Pharmacotherapy (Medications Ordered): No orders of the defined types were placed in this encounter.  Orders:  No orders of the defined types were placed in this encounter.    Interventional Therapies  Risk Factors  Considerations:   Aortic atherosclerosis  osteoporosis  HTN  GERD  palpitations  prediabetes  SOB  Hx. of breast cancer   Planned  Pending:   Therapeutic right cervical ESI #2  (02/05/2023) TENS ordered   Under consideration:   Diagnostic left inferolateral and recurrent genicular NB #1  Therapeutic left inferolateral and recurrent genicular nerve RFA #1  Diagnostic right T9-10 thoracic ESI #1  Diagnostic bilateral T9, T10, T11, and T12 MMB thoracic facet RFA (DIFICULT CANDIDATE DUE TO HARDWARE)    Completed:   Therapeutic left cervical facet MB (3-C7) RFA x1 (09/09/2023) (100/100/50/20)  Therapeutic right cervical facet MB (3-C7) RFA x1 (08/05/2023) (100/100/50/50)  Diagnostic left genicular NB x1 (07/03/2021) (100/100/50/50)  Therapeutic left genicular nerve RFA x1 (08/09/2021) (100/100/30/0)  Diagnostic left cervical facet MBB x1 (10/26/2020) (100/100/0)  Diagnostic right cervical facet MBB x2 (03/25/2023) (100/100/50 x 3 wks/ 0)   Diagnostic bilateral suprascapular NB x1 (10/05/2020) (100/100/100 x1 day/0)  Diagnostic right cervical ESI x1 (09/14/2020) (100/100/100/90-100)  Diagnostic/therapeutic bilateral T9-T12 thoracic facet MBB x2 (06/06/2020) (100/100/0)  Diagnostic/Therapeutic right trapezoid bursa injection x1 (11/09/2020) (did not follow-up)    Therapeutic  Palliative (PRN) options:   Therapeutic/palliative bilateral T9-12 thoracic facet MBB #3  Therapeutic/palliative right trapezoid bursa injection #2      No follow-ups on file.    Recent Visits Date  Type Provider Dept  06/15/24 Procedure visit Tanya Glisson, MD [facility]  06/14/24 Office Visit Tanya Glisson, MD [facility]  Showing recent visits within past 90 days and meeting all other requirements Future Appointments Date Type Provider Dept  06/30/24 Appointment Tanya Glisson, MD [facility]  Showing future appointments within next 90 days and meeting all other requirements  I discussed the assessment and treatment plan with the patient. The patient was provided an opportunity to ask questions and all were answered. The patient agreed with the plan and demonstrated an understanding of the instructions.  Patient advised to call back or seek an in-person evaluation if the symptoms or condition worsens.  Duration of encounter: *** minutes.  Total time on encounter, as per AMA guidelines included both the face-to-face and non-face-to-face time personally spent by the physician and/or other qualified health care professional(s) on the day of the encounter (includes time in activities that require the physician or other qualified health care professional and does not include time in activities normally performed by clinical staff). Physician's time may include the following activities when performed: Preparing to see the patient (e.g., pre-charting review of records, searching for previously ordered imaging, lab work, and nerve conduction tests) Review of prior analgesic pharmacotherapies. Reviewing PMP Interpreting ordered tests (e.g., lab work, imaging, nerve conduction tests) Performing post-procedure evaluations, including interpretation of diagnostic procedures Obtaining and/or reviewing separately obtained history Performing a medically appropriate examination and/or evaluation Counseling and educating the patient/family/caregiver Ordering medications, tests, or procedures Referring and communicating with other health care professionals  (when not separately reported) Documenting clinical information in the electronic or other health record Independently interpreting results (not separately reported) and communicating results to the patient/ family/caregiver Care coordination (not separately reported)  Note by: Glisson DELENA Tanya, MD (TTS and  AI technology used. I apologize for any typographical errors that were not detected and corrected.) Date: 06/30/2024; Time: 7:49 AM

## 2024-06-30 ENCOUNTER — Ambulatory Visit
Admission: RE | Admit: 2024-06-30 | Discharge: 2024-06-30 | Disposition: A | Discharge status: Home / Self Care | Attending: Pain Medicine | Admitting: Pain Medicine

## 2024-06-30 ENCOUNTER — Ambulatory Visit
Admission: RE | Admit: 2024-06-30 | Discharge: 2024-06-30 | Disposition: A | Discharge status: Home / Self Care | Source: Ambulatory Visit | Attending: Pain Medicine | Admitting: Pain Medicine

## 2024-06-30 ENCOUNTER — Encounter: Payer: Self-pay | Admitting: Pain Medicine

## 2024-06-30 ENCOUNTER — Ambulatory Visit (HOSPITAL_BASED_OUTPATIENT_CLINIC_OR_DEPARTMENT_OTHER): Admitting: Pain Medicine

## 2024-06-30 VITALS — BP 192/72 | HR 67 | Temp 96.6°F | Resp 16 | Ht 59.0 in | Wt 141.0 lb

## 2024-06-30 DIAGNOSIS — G8929 Other chronic pain: Secondary | ICD-10-CM | POA: Insufficient documentation

## 2024-06-30 DIAGNOSIS — M79645 Pain in left finger(s): Secondary | ICD-10-CM | POA: Insufficient documentation

## 2024-06-30 DIAGNOSIS — M5412 Radiculopathy, cervical region: Secondary | ICD-10-CM | POA: Diagnosis not present

## 2024-06-30 DIAGNOSIS — M79601 Pain in right arm: Secondary | ICD-10-CM

## 2024-06-30 DIAGNOSIS — G4486 Cervicogenic headache: Secondary | ICD-10-CM | POA: Diagnosis not present

## 2024-06-30 DIAGNOSIS — M79642 Pain in left hand: Secondary | ICD-10-CM | POA: Diagnosis not present

## 2024-06-30 DIAGNOSIS — M542 Cervicalgia: Secondary | ICD-10-CM

## 2024-06-30 DIAGNOSIS — Z09 Encounter for follow-up examination after completed treatment for conditions other than malignant neoplasm: Secondary | ICD-10-CM

## 2024-06-30 DIAGNOSIS — M85842 Other specified disorders of bone density and structure, left hand: Secondary | ICD-10-CM | POA: Diagnosis not present

## 2024-06-30 DIAGNOSIS — M19042 Primary osteoarthritis, left hand: Secondary | ICD-10-CM | POA: Diagnosis not present

## 2024-06-30 NOTE — Patient Instructions (Addendum)
 ______________________________________________________________________    Procedure instructions  Stop blood-thinners  Do not eat or drink fluids (other than water ) for 6 hours before your procedure  No water  for 2 hours before your procedure  Take your blood pressure medicine with a sip of water   Arrive 30 minutes before your appointment  If sedation is planned, bring suitable driver. [place], [place], & public transportation are NOT APPROVED)  Carefully read the Preparing for your procedure detailed instructions  If you have questions call us  at [phone]  Procedure appointments are for procedures only.   NO medication refills or new problem evaluations will be done on procedure days.   Only the scheduled, pre-approved procedure and side will be done.   ______________________________________________________________________      ______________________________________________________________________    Preparing for your procedure  Appointments: If you think you may not be able to keep your appointment, call 24-48 hours in advance to cancel. We need time to make it available to others.  Procedure visits are for procedures only. During your procedure appointment there will be: NO Prescription Refills*. NO medication changes or discussions*. NO discussion of disability issues*. NO unrelated pain problem evaluations*. NO evaluations to order other pain procedures*. *These will be addressed at a separate and distinct evaluation encounter on the provider's evaluation schedule and not during procedure days.  Instructions: Food intake: Avoid eating anything solid for at least 8 hours prior to your procedure. Clear liquid intake: You may take clear liquids such as water  up to 2 hours prior to your procedure. (No carbonated drinks. No soda.) Transportation: Unless otherwise stated by your physician, bring a driver. (Driver cannot be a Market researcher, Pharmacist, community, or any other form of public  transportation.) Morning Medicines: Except for blood thinners, take all of your other morning medications with a sip of water . Make sure to take your heart and blood pressure medicines. If your blood pressure's lower number is above 100, the case will be rescheduled. Blood thinners: Make sure to stop your blood thinners as instructed.  If you take a blood thinner, but were not instructed to stop it, call our office [phone] and ask to talk to a nurse. Not stopping a blood thinner prior to certain procedures could lead to serious complications. Diabetics on insulin: Notify the staff so that you can be scheduled 1st case in the morning. If your diabetes requires high dose insulin, take only  of your normal insulin dose the morning of the procedure and notify the staff that you have done so. Preventing infections: Shower with an antibacterial soap the morning of your procedure.  Build-up your immune system: Take 1000 mg of Vitamin C with every meal (3 times a day) the day prior to your procedure. Antibiotics: Inform the nursing staff if you are taking any antibiotics or if you have any conditions that may require antibiotics prior to procedures. (Example: recent joint implants)   Pregnancy: If you are pregnant make sure to notify the nursing staff. Not doing so may result in injury to the fetus, including death.  Sickness: If you have a cold, fever, or any active infections, call and cancel or reschedule your procedure. Receiving steroids while having an infection may result in complications. Arrival: You must be in the facility at least 30 minutes prior to your scheduled procedure. Tardiness: Your scheduled time is also the cutoff time. If you do not arrive at least 15 minutes prior to your procedure, you will be rescheduled.  Children: Do not bring any children  with you. Make arrangements to keep them home. Dress appropriately: There is always a possibility that your clothing may get soiled. Avoid  long dresses. Valuables: Do not bring any jewelry or valuables.  Reasons to call and reschedule or cancel your procedure: (Following these recommendations will minimize the risk of a serious complication.) Surgeries: Avoid having procedures within 2 weeks of any surgery. (Avoid for 2 weeks before or after any surgery). Flu Shots: Avoid having procedures within 2 weeks of a flu shots or . (Avoid for 2 weeks before or after immunizations). Barium: Avoid having a procedure within 7-10 days after having had a radiological study involving the use of radiological contrast. (Myelograms, Barium swallow or enema study). Heart attacks: Avoid any elective procedures or surgeries for the initial 6 months after a Myocardial Infarction (Heart Attack). Blood thinners: It is imperative that you stop these medications before procedures. Let us  know if you if you take any blood thinner.  Infection: Avoid procedures during or within two weeks of an infection (including chest colds or gastrointestinal problems). Symptoms associated with infections include: Localized redness, fever, chills, night sweats or profuse sweating, burning sensation when voiding, cough, congestion, stuffiness, runny nose, sore throat, diarrhea, nausea, vomiting, cold or Flu symptoms, recent or current infections. It is specially important if the infection is over the area that we intend to treat. Heart and lung problems: Symptoms that may suggest an active cardiopulmonary problem include: cough, chest pain, breathing difficulties or shortness of breath, dizziness, ankle swelling, uncontrolled high or unusually low blood pressure, and/or palpitations. If you are experiencing any of these symptoms, cancel your procedure and contact your primary care physician for an evaluation.  Remember:  Regular Business hours are:  Monday to Thursday 8:00 AM to 4:00 PM  Provider's Schedule: Eric Como, MD:  Procedure days: Tuesday and Thursday 7:30  AM to 4:00 PM  Wallie Sherry, MD:  Procedure days: Monday and Wednesday 7:30 AM to 4:00 PM Last  Updated: 11/04/2023 ______________________________________________________________________      ______________________________________________________________________    General Risks and Possible Complications  Patient Responsibilities: It is important that you read this as it is part of your informed consent. It is our duty to inform you of the risks and possible complications associated with treatments offered to you. It is your responsibility as a patient to read this and to ask questions about anything that is not clear or that you believe was not covered in this document.  Patient's Rights: You have the right to refuse treatment. You also have the right to change your mind, even after initially having agreed to have the treatment done. However, under this last option, if you wait until the last second to change your mind, you may be charged for the materials used up to that point.  Introduction: Medicine is not an Visual merchandiser. Everything in Medicine, including the lack of treatment(s), carries the potential for danger, harm, or loss (which is by definition: Risk). In Medicine, a complication is a secondary problem, condition, or disease that can aggravate an already existing one. All treatments carry the risk of possible complications. The fact that a side effects or complications occurs, does not imply that the treatment was conducted incorrectly. It must be clearly understood that these can happen even when everything is done following the highest safety standards.  No treatment: You can choose not to proceed with the proposed treatment alternative. The "PRO(s)" would include: avoiding the risk of complications associated with the therapy. The "CON(s)"  would include: not getting any of the treatment benefits. These benefits fall under one of three categories: diagnostic; therapeutic; and/or  palliative. Diagnostic benefits include: getting information which can ultimately lead to improvement of the disease or symptom(s). Therapeutic benefits are those associated with the successful treatment of the disease. Finally, palliative benefits are those related to the decrease of the primary symptoms, without necessarily curing the condition (example: decreasing the pain from a flare-up of a chronic condition, such as incurable terminal cancer).  General Risks and Complications: These are associated to most interventional treatments. They can occur alone, or in combination. They fall under one of the following six (6) categories: no benefit or worsening of symptoms; bleeding; infection; nerve damage; allergic reactions; and/or death. No benefits or worsening of symptoms: In Medicine there are no guarantees, only probabilities. No healthcare provider can ever guarantee that a medical treatment will work, they can only state the probability that it may. Furthermore, there is always the possibility that the condition may worsen, either directly, or indirectly, as a consequence of the treatment. Bleeding: This is more common if the patient is taking a blood thinner, either prescription or over the counter (example: Goody Powders, Fish oil, Aspirin , Garlic, etc.), or if suffering a condition associated with impaired coagulation (example: Hemophilia, cirrhosis of the liver, low platelet counts, etc.). However, even if you do not have one on these, it can still happen. If you have any of these conditions, or take one of these drugs, make sure to notify your treating physician. Infection: This is more common in patients with a compromised immune system, either due to disease (example: diabetes, cancer, human immunodeficiency virus [HIV], etc.), or due to medications or treatments (example: therapies used to treat cancer and rheumatological diseases). However, even if you do not have one on these, it can still  happen. If you have any of these conditions, or take one of these drugs, make sure to notify your treating physician. Nerve Damage: This is more common when the treatment is an invasive one, but it can also happen with the use of medications, such as those used in the treatment of cancer. The damage can occur to small secondary nerves, or to large primary ones, such as those in the spinal cord and brain. This damage may be temporary or permanent and it may lead to impairments that can range from temporary numbness to permanent paralysis and/or brain death. Allergic Reactions: Any time a substance or material comes in contact with our body, there is the possibility of an allergic reaction. These can range from a mild skin rash (contact dermatitis) to a severe systemic reaction (anaphylactic reaction), which can result in death. Death: In general, any medical intervention can result in death, most of the time due to an unforeseen complication. ______________________________________________________________________     ______________________________________________________________________    TENS (Device can be purchased online, without prescription. Search: TENS 7000.) Transcutaneous electrical nerve stimulation (TENS) is a method of pain relief that involves the use of mild electrical stimulation. A TENS machine is a small, battery-operated device that has leads connected to sticky pads called electrodes. Available at [employer]. (Estimated price as of July 10th, 2025.)  (Estimated [employer] cost: $38.88) Rechargeable 9V batteries:  (Estimated [employer] cost: $12.98)  Larger Reusable 2 x 4 TENS Pads/Electrodes:  (Estimated [employer] cost: $9.99)  Total cost: $61.85    ELECTRODE PLACEMENT:   TENS UNIT SAFETY WARNING SHEET and INFORMATION INDICATIONS AND CONTRAINDICTIONS Read the operation manual before using the device.  Freight forwarder ([place] ) restricts this device to sale by or on the order of a  physician. Observe your physician's precise instructions and let him show you where to apply the electrodes. For a successful therapy, the correct application of the electrodes is an important factor. Carefully write down the settings your physician recommended. Indications for use This device is a prescription device and only for symptomatic relief of chronic intractable pain.  Contraindications:   Any electrode placement that applies current to the carotid sinus (neck) region.   Patients with implanted electronic devices (for example, a pacemaker) or metallic implants should not undertake.   Any electrode placement that causes current to flow transcerebrally (through the head). The use of unit whenever pain symptoms are undiagnosed, unit etiology is determined.   The use of TENS whenever pain syndromes are undiagnosed, until etiology is established.   WARNINGS AND PRECAUTIONS  Warnings:   The device must be kept out of reach of children.   The safety of device for use during pregnancy or delivery has not been established.   Do not place electrodes on front of the throat. This may result in spasms of the laryngeal and pharyngeal muscles.   Do not place the electrodes over the carotid nerve (side of neck below ear).   The device is not effective for pain of central origin (headaches).   The device may interfere with electronic monitoring equipment (such as ECG monitors and ECG alarms).   Electrodes should not be placed over the eyes, in the mouth, or internally.   These devices have no curative value.   TENS devices should be used only under the continued supervision of a physician.   TENS is a symptomatic treatment and as such suppresses the sensation of pain which would otherwise serve as a protective mechanism. Precautions/Adverse Reactions   Isolated cases of skin irritation may occur at the site of electrode placement following long-term application.   Stimulation should be stopped and electrodes removed  until the cause of the irritation can be determined.   Effectiveness is highly dependent upon patient selection by a person qualified in the management of pain patients.   If the device treatment becomes ineffective or unpleasant, stimulation should be discontinued until reevaluation by a physician/clinician.   Always turn the device off before applying or removing electrodes.   Skin irritation and electrode [place] are potential adverse reactions.  PURPOSE: A Transcutaneous Electrical Nerve Stimulator, or TENS, unit is designed to relieve post-operative, acute and chronic pain. It is used for pain caused by peripheral nerves and not central. TENS units are prescription-only devices.   OPERATION: TENS units work in a couple of ways. The first way they are thought to work is by a method called the [employer]. The [employer] states that our brains can only handle one stimulus at a time. When you have chronic pain, this pain signal is constantly being sent to your brain and recognized as pain. When an electrical stimulus is added to the area of pain the body feels this electrical stimulus, and since the brain can only handle one thing at a time, the pain is not transmitted to the brain. The second method thought to be part of TENS unit's success is by way of stimulating our own bodies to release their own natural painkillers. TENS units do not work for everyone and results may vary. Always follow the instructions and warnings in your user's manual.   USE: One of the most important tasks  that must be performed is battery maintenance. If you are using a Engineering geologist, always fully charge it and fully deplete it before charging it again. These batteries can develop memories and by not performing this charging task correctly, your battery's life can be greatly diminished. If your battery does develop a memory you can help expand the memory by charging for 12 - 13 hours and then completely  depleting the battery. Always prepare the skin before applying electrodes. Your skin should be clean and free of any lotions or creams. If you are using electrodes that use conductive gel, apply a small, even layer over the electrode. For carbon, self-adhesive electrodes, apply a drop of water  to the electrodes before applying to the skin. The electrodes attach to the lead wires and then the TENS unit. Always grasp the connector and not the cord when inserting or removing. When making adjustments, always make sure the unit's channels (1 and 2) are in the OFF position. The actual settings should be recommended and prescribed by your physician. Medical equipment suppliers don'tset or instruct users as to user settings. When you are using the BURST mode, the unit delivers a series of quick pulses followed by a rest. This cycle repeats itself frequently. Always have channels OFF before changing modes.   For MODULATION mode, the stimulation automatically varies the width of the pulse.   For CONVENTIONAL mode, the stimulation is constant. After the settings have been fine-tuned, set the timer to 30 or 60 minutes. Your physician should also prescribe the use time. When the lights become dim, it means your batteries should be replaced or recharged.   ACCESSORIES: The electrodes and lead wires can be obtained from your medical equipment supplier. Your medical equipment supplier can set up a recurring delivery to accommodate your needs. Electrodes should be replaced once a month and lead wires once every 6 months.  Video Tutorial https://youtu.be/[email or web address]  ______________________________________________________________________

## 2024-07-06 ENCOUNTER — Ambulatory Visit
Admission: RE | Admit: 2024-07-06 | Discharge: 2024-07-06 | Disposition: A | Discharge status: Home / Self Care | Source: Ambulatory Visit | Attending: Pain Medicine | Admitting: Pain Medicine

## 2024-07-06 ENCOUNTER — Encounter: Payer: Self-pay | Admitting: Pain Medicine

## 2024-07-06 ENCOUNTER — Ambulatory Visit (HOSPITAL_BASED_OUTPATIENT_CLINIC_OR_DEPARTMENT_OTHER): Admitting: Pain Medicine

## 2024-07-06 ENCOUNTER — Ambulatory Visit: Admitting: Neurosurgery

## 2024-07-06 ENCOUNTER — Encounter: Payer: Self-pay | Admitting: Neurosurgery

## 2024-07-06 VITALS — BP 162/100 | Ht 59.0 in | Wt 144.5 lb

## 2024-07-06 VITALS — BP 145/77 | HR 84 | Temp 97.4°F | Ht 59.0 in | Wt 144.0 lb

## 2024-07-06 DIAGNOSIS — M25552 Pain in left hip: Secondary | ICD-10-CM

## 2024-07-06 DIAGNOSIS — M79645 Pain in left finger(s): Secondary | ICD-10-CM | POA: Diagnosis not present

## 2024-07-06 DIAGNOSIS — M1612 Unilateral primary osteoarthritis, left hip: Secondary | ICD-10-CM | POA: Diagnosis not present

## 2024-07-06 DIAGNOSIS — Z9889 Other specified postprocedural states: Secondary | ICD-10-CM

## 2024-07-06 DIAGNOSIS — G8929 Other chronic pain: Secondary | ICD-10-CM

## 2024-07-06 DIAGNOSIS — M542 Cervicalgia: Secondary | ICD-10-CM

## 2024-07-06 DIAGNOSIS — R102 Pelvic and perineal pain: Secondary | ICD-10-CM | POA: Diagnosis not present

## 2024-07-06 DIAGNOSIS — M79642 Pain in left hand: Secondary | ICD-10-CM | POA: Insufficient documentation

## 2024-07-06 DIAGNOSIS — M1812 Unilateral primary osteoarthritis of first carpometacarpal joint, left hand: Secondary | ICD-10-CM | POA: Insufficient documentation

## 2024-07-06 MED ORDER — LIDOCAINE HCL 2 % IJ SOLN
1.0000 mL | Freq: Once | INTRAMUSCULAR | Status: AC
  Administered 2024-07-06 (×2): 20 mg
  Filled 2024-07-06: qty 20

## 2024-07-06 MED ORDER — PENTAFLUOROPROP-TETRAFLUOROETH EX AERO
INHALATION_SPRAY | Freq: Once | CUTANEOUS | Status: AC
  Administered 2024-07-06 (×2): 30 via TOPICAL

## 2024-07-06 MED ORDER — ROPIVACAINE HCL 2 MG/ML IJ SOLN
1.0000 mL | Freq: Once | INTRAMUSCULAR | Status: AC
  Administered 2024-07-06 (×2): 1 mL
  Filled 2024-07-06: qty 20

## 2024-07-06 MED ORDER — TRIAMCINOLONE ACETONIDE 40 MG/ML IJ SUSP
40.0000 mg | Freq: Once | INTRAMUSCULAR | Status: AC
  Administered 2024-07-06 (×2): 40 mg
  Filled 2024-07-06: qty 1

## 2024-07-06 NOTE — Patient Instructions (Signed)

## 2024-07-06 NOTE — Progress Notes (Signed)
   REFERRING PHYSICIAN:  No referring provider defined for this encounter.  DOS: 03/12/23 removal of iliac screws   HISTORY OF PRESENT ILLNESS: Maria Spencer continues to have pain and imbalance when she walks.  She has pain around the top of her left iliac crest.  I removed her iliac screws a little over a year ago.  This has helped with her pain, but she continues to have some discomfort and difficulty with her legs.  She has no numbness or tingling.  She is also suffering from some neck pain.  She has had multiple procedures which have helped.  Neck pain continues to bother her however.      PHYSICAL EXAMINATION:  General: Patient is well developed, well nourished, calm, collected, and in no apparent distress.   NEUROLOGICAL:  General: In no acute distress.   Awake, alert, oriented to person, place, and time.  Pupils equal round and reactive to light.  Facial tone is symmetric.    Strength:            Side Iliopsoas Quads Hamstring PF DF EHL  R 5 5 5 5 5 5   L 5 5 5 5 5 5    FABER testing shows some discomfort in her left hip   ROS (Neurologic):  Negative except as noted above  IMAGING: Older imaging reviewed-no new imaging to review today.  ASSESSMENT/PLAN:  Maria Spencer has some improvements from her removal of sacroiliac fixation.  Additionally, nonoperative management of her neck pain has helped at times.  She has no clear symptoms of lumbar or cervical radiculopathy at this time.  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    I am interested to see how her evaluation with Dr. Ezzard goes.    If she would like to look into her neck, she will let me know and I will obtain additional imaging.  I will see her back on an as-needed basis.  Reeves Daisy MD Department of neurosurgery

## 2024-07-06 NOTE — Progress Notes (Signed)
 PROVIDER NOTE: Interpretation of information contained herein should be left to medically-trained personnel. Specific patient instructions are provided elsewhere under Patient Instructions section of medical record. This document was created in part using STT-dictation technology, any transcriptional errors that may result from this process are unintentional.  Patient: Maria Spencer Type: Established DOB: 08-Nov-1946 MRN: [id number] PCP: Abbey Bruckner, MD  Service: Procedure DOS: 07/06/2024 Setting: Ambulatory Location: Ambulatory outpatient facility Delivery: Face-to-face Provider: Eric DELENA Como, MD Specialty: Interventional Pain Management Specialty designation: 09 Location: Outpatient facility Ref. Prov.: Bair, Kalpana, MD       Interventional Therapy   Primary Reason for Visit: Interventional Pain Management Treatment. CC: Hand Pain (Thumb, left)    Procedure:          Anesthesia, Analgesia, Anxiolysis:  Type: Therapeutic Small Joint (CPT 20600) Steroid Injection  #1  Laterality: Left  Type: Local Anesthesia Local Anesthetic: Lidocaine  1-2% Sedation: None  Indication(s):  Analgesia Route: Infiltration ([place]/IM) IV Access: N/A   Patient position: Sitting Extremity position: Supine   Target Area: Lateral aspect Target: First (thumb) CMC (Carpometacarpal)   Approach: Lateral approach.   1. 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 Chronic thumb pain (Left)   3. Chronic hand pain (Left)   4. Primary osteoarthritis of first carpometacarpal joint of left hand   5. Chronic pain of left thumb   6. Chronic hand pain, left    NAS-11 Pain score:   Pre-procedure: 5 /10   Post-procedure: 0-No pain/10     H&P (Pre-op Assessment):  Maria Spencer is a 78 y.o. (year old), female patient, seen today for interventional treatment. She  has a past surgical history that includes Total shoulder replacement (Right, 2012); Shoulder surgery (Left, 2008, 2009);  Tonsillectomy; Bone graft hip iliac crest (2018); Spine surgery (10/23/2017); Ventral hernia repair (N/A, 05/29/2018); Insertion of mesh (N/A, 05/29/2018); Cardiac catheterization (2001); Breast lumpectomy (Right, 2009); Breast biopsy (Left, 2009); Breast biopsy (Right, 2009); Colonoscopy with propofol  (N/A, 05/15/2020); Spinal fusion (07/24/2020); Cataract extraction, bilateral (Bilateral, 10/2019); Tubal ligation; Quadriceps tendon repair (Right, 12/05/2020); right gluteus medius/minimus repair Dr. Edie 11/2020 ; Total knee arthroplasty (Left, 03/18/2022); Hardware Removal (N/A, 03/12/2023); and Colon surgery (03/2023). Maria Spencer has a current medication list which includes the following prescription(s): atorvastatin , levothyroxine , metoprolol  succinate, multi-vitamins, and telmisartan . Her primarily concern today is the Hand Pain (Thumb, left)  Initial Vital Signs:  Pulse/HCG Rate: 84  Temp: (!) 97.4 F (36.3 C) Resp:   BP: (!) 145/77 SpO2: 96 %  BMI: Estimated body mass index is 29.08 kg/m as calculated from the following:   Height as of this encounter: 4' 11 (1.499 m).   Weight as of this encounter: 144 lb (65.3 kg).  Risk Assessment: Allergies: Reviewed. She is allergic to sulfa antibiotics, nitrofuran derivatives, amlodipine  besylate, and augmentin  [amoxicillin -pot clavulanate].  Allergy Precautions: None required Coagulopathies: Reviewed. None identified.  Blood-thinner therapy: None at this time Active Infection(s): Reviewed. None identified. Maria Spencer is afebrile  Site Confirmation: Maria Spencer was asked to confirm the procedure and laterality before marking the site Procedure checklist: Completed Consent: Before the procedure and under the influence of no sedative(s), amnesic(s), or anxiolytics, the patient was informed of the treatment options, risks and possible complications. To fulfill our ethical and legal obligations, as recommended by the American Medical Association's Code  of Ethics, I have informed the patient of my clinical impression; the nature and purpose of the treatment or procedure; the risks, benefits, and possible complications of the  intervention; the alternatives, including doing nothing; the risk(s) and benefit(s) of the alternative treatment(s) or procedure(s); and the risk(s) and benefit(s) of doing nothing. The patient was provided information about the general risks and possible complications associated with the procedure. These may include, but are not limited to: failure to achieve desired goals, infection, bleeding, organ or nerve damage, allergic reactions, paralysis, and death. In addition, the patient was informed of those risks and complications associated to the procedure, such as failure to decrease pain; infection; bleeding; organ or nerve damage with subsequent damage to sensory, motor, and/or autonomic systems, resulting in permanent pain, numbness, and/or weakness of one or several areas of the body; allergic reactions; (i.e.: anaphylactic reaction); and/or death. Furthermore, the patient was informed of those risks and complications associated with the medications. These include, but are not limited to: allergic reactions (i.e.: anaphylactic or anaphylactoid reaction(s)); adrenal axis suppression; blood sugar elevation that in diabetics may result in ketoacidosis or comma; water  retention that in patients with history of congestive heart failure may result in shortness of breath, pulmonary edema, and decompensation with resultant heart failure; weight gain; swelling or edema; medication-induced neural toxicity; particulate matter embolism and blood vessel occlusion with resultant organ, and/or nervous system infarction; and/or aseptic necrosis of one or more joints. Finally, the patient was informed that Medicine is not an exact science; therefore, there is also the possibility of unforeseen or unpredictable risks and/or possible complications that  may result in a catastrophic outcome. The patient indicated having understood very clearly. We have given the patient no guarantees and we have made no promises. Enough time was given to the patient to ask questions, all of which were answered to the patient's satisfaction. Maria Spencer has indicated that she wanted to continue with the procedure. Attestation: I, the ordering provider, attest that I have discussed with the patient the benefits, risks, side-effects, alternatives, likelihood of achieving goals, and potential problems during recovery for the procedure that I have provided informed consent. Date  Time: 07/06/2024  1:36 PM  Pre-Procedure Preparation:  Monitoring: As per clinic protocol. Respiration, ETCO2, SpO2, BP, heart rate and rhythm monitor placed and checked for adequate function Safety Precautions: Patient was assessed for positional comfort and pressure points before starting the procedure. Time-out: I initiated and conducted the Time-out before starting the procedure, as per protocol. The patient was asked to participate by confirming the accuracy of the Time Out information. Verification of the correct person, site, and procedure were performed and confirmed by me, the nursing staff, and the patient. Time-out conducted as per Joint Commission's Universal Protocol ([email or web address].01.01.01). Time: 1418 Start Time: 1418 hrs.  Description of Procedure:          Area Prepped: Entire hand and wrist area ChloraPrep (2% chlorhexidine  gluconate and 70% isopropyl alcohol) Safety Precautions: Aspiration looking for blood return was conducted prior to all injections. At no point did we inject any substances, as a needle was being advanced. No attempts were made at seeking any paresthesias. Safe injection practices and needle disposal techniques used. Medications properly checked for expiration dates. SDV (single dose vial) medications used. Description of the Procedure: Protocol guidelines were  followed. The patient was placed in position. The target area was identified and the area prepped in the usual manner. Skin & deeper tissues infiltrated with local anesthetic. Appropriate amount of time allowed to pass for local anesthetics to take effect. The procedure needles were then advanced to the target area. Proper needle placement secured. Negative aspiration confirmed.  Solution injected in intermittent fashion, asking for systemic symptoms every 0.5cc of injectate. The needles were then removed and the area cleansed, making sure to leave some of the prepping solution back to take advantage of its long term bactericidal properties.    Vitals:   07/06/24 1333  BP: (!) 145/77  Pulse: 84  Temp: (!) 97.4 F (36.3 C)  SpO2: 96%  Weight: 144 lb (65.3 kg)  Height: 4' 11 (1.499 m)    Start Time: 1418 hrs. End Time: 1421 hrs. Materials:  Needle(s) Type: Spinal Needle Gauge: 22G Length: 3.5-in Medication(s): Please see orders for medications and dosing details.  Type of Imaging Technique: Fluoroscopy Guidance (Non-spinal) Indication(s): Fluoroscopy guidance for needle placement to enhance accuracy in procedures requiring precise needle localization for targeted delivery of medication in or near specific anatomical locations not easily accessible without such real-time imaging assistance. Exposure Time: Please see nurses notes. Contrast: None used. Fluoroscopic Guidance: I was personally present during the use of fluoroscopy. Tunnel Vision Technique used to obtain the best possible view of the target area. Parallax error corrected before commencing the procedure. Direction-depth-direction technique used to introduce the needle under continuous pulsed fluoroscopy. Once target was reached, antero-posterior, oblique, and lateral fluoroscopic projection used confirm needle placement in all planes. Images permanently stored in EMR. Ultrasound Guidance: N/A Interpretation: No contrast  injected. I personally interpreted the imaging intraoperatively. Adequate needle placement confirmed in multiple planes. Permanent images saved into the patient's record.  Antibiotic Prophylaxis:   Anti-infectives (From admission, onward)    None      Indication(s): None identified  Post-operative Assessment:  Post-procedure Vital Signs:  Pulse/HCG Rate: 84  Temp: (!) 97.4 F (36.3 C) Resp:   BP: (!) 145/77 SpO2: 96 %  EBL: None  Complications: No immediate post-treatment complications observed by team, or reported by patient.  Note: The patient tolerated the entire procedure well. A repeat set of vitals were taken after the procedure and the patient was kept under observation following institutional policy, for this type of procedure. Post-procedural neurological assessment was performed, showing return to baseline, prior to discharge. The patient was provided with post-procedure discharge instructions, including a section on how to identify potential problems. Should any problems arise concerning this procedure, the patient was given instructions to immediately contact us , at any time, without hesitation. In any case, we plan to contact the patient by telephone for a follow-up status report regarding this interventional procedure.  Comments:  No additional relevant information.  Plan of Care (POC)  Orders:  Orders Placed This Encounter  Procedures   Small Joint Injection/Arthrocentesis    Joint: First carpometacarpal joint Laterality: Left Sedation: No Sedation Purpose: Therapeutic Indication(s): Chronic pain    Standing Status:   Future    Expiration Date:   10/06/2024    Scheduling Instructions:     Requested Scheduling Timeframe: Today   DG PAIN [facility] C-ARM 1-60 MIN NO REPORT    Intraoperative interpretation by procedural physician at [facility].    Standing Status:   Standing    Number of Occurrences:   1    Reason for exam::   Assistance in needle  guidance and placement for procedures requiring needle placement in or near specific anatomical locations not easily accessible without such assistance.   Informed Consent Details: Physician/Practitioner Attestation; Transcribe to consent form and obtain patient signature    Nursing Order: Transcribe to consent form and obtain patient signature. Note: Always confirm laterality of pain with Maria Spencer,  before procedure.    Physician/Practitioner attestation of informed consent for procedure/surgical case:   I, the physician/practitioner, attest that I have discussed with the patient the benefits, risks, side effects, alternatives, likelihood of achieving goals and potential problems during recovery for the procedure that I have provided informed consent.    Procedure:   Small Joint Injection and/or Aspiration (Arthrocentesis).     Physician/Practitioner performing the procedure:   Damarion Mendizabal A. Joelee Snoke, MD    Indication/Reason:   Chronic left thumb pain   Provide equipment / supplies at bedside    Procedure tray: Block Tray (Disposable  single use) Skin infiltration needle: Regular 1.5-in, 25-G, (x1) Block Needle type: Regular Amount/quantity: 1 Size: Short(1.5-inch) Gauge: (25G x1) + (22G x1)    Standing Status:   Standing    Number of Occurrences:   1    Specify:   Block Tray     Opioid Analgesic: No opioid analgesics prescribed by our practice. MME/day: 0 mg/day    Medications ordered for procedure: Meds ordered this encounter  Medications   pentafluoroprop-tetrafluoroeth (GEBAUERS) aerosol   triamcinolone  acetonide (KENALOG -40) injection 40 mg   lidocaine  (XYLOCAINE ) 2 % (with pres) injection 20 mg   ropivacaine  (PF) 2 mg/mL (0.2%) (NAROPIN ) injection 1 mL   Medications administered: We administered pentafluoroprop-tetrafluoroeth, triamcinolone  acetonide, lidocaine , and ropivacaine  (PF) 2 mg/mL (0.2%).  See the medical record for exact dosing, route, and time of  administration.    Interventional Therapies  Risk Factors  Considerations:   Aortic atherosclerosis  osteoporosis  HTN  GERD  palpitations  prediabetes  SOB  Hx. of breast cancer           Planned  Pending:   Diagnostic/therapeutic left thumb joint inj. #1  Diagnostic x-rays of the left thumb ordered today (06/30/2024). (02/05/2023) TENS ordered.  However, she did not seem to have obtained that unit.  Today (06/30/2024) I have again provided her with written information about a TENS unit and how to order there that through [employer].   Under consideration:  Therapeutic right cervical ESI #3   Diagnostic left inferolateral and recurrent genicular NB #1  Therapeutic left inferolateral and recurrent genicular nerve RFA #1  Diagnostic right T9-10 thoracic ESI #1  Diagnostic bilateral T9, T10, T11, and T12 MMB thoracic facet RFA (DIFICULT CANDIDATE DUE TO HARDWARE)    Completed:   Therapeutic left cervical facet MB (3-C7) RFA x1 (09/09/2023) (100/100/50/20)  Therapeutic right cervical facet MB (3-C7) RFA x1 (08/05/2023) (100/100/50/50)  Diagnostic left genicular NB x1 (07/03/2021) (100/100/50/50)  Therapeutic left genicular nerve RFA x1 (08/09/2021) (100/100/30/0)  Diagnostic left cervical facet MBB x1 (10/26/2020) (100/100/0)  Diagnostic right cervical facet MBB x2 (03/25/2023) (100/100/50 x 3 wks/ 0)   Diagnostic bilateral suprascapular NB x1 (10/05/2020) (100/100/100 x1 day/0)  Diagnostic right cervical ESI x2 (06/15/2024) (100/90/UEP:80Neck:20)  Diagnostic/therapeutic bilateral T9-T12 thoracic facet MBB x2 (06/06/2020) (100/100/0)  Diagnostic/Therapeutic right trapezoid bursa injection x1 (11/09/2020) (did not follow-up)    Therapeutic  Palliative (PRN) options:   Therapeutic/palliative bilateral T9-12 thoracic facet MBB #3  Therapeutic/palliative right trapezoid bursa injection #2       Follow-up plan:   Return in about 2 weeks (around 07/20/2024) for Eval-day (M,W), (Face2F),  (PPE).     Recent Visits Date Type Provider Dept  06/30/24 Office Visit Tanya Glisson, MD [facility]  06/15/24 Procedure visit Tanya Glisson, MD [facility]  06/14/24 Office Visit Tanya Glisson, MD [facility]  Showing recent visits within past 90 days and  meeting all other requirements Today's Visits Date Type Provider Dept  07/06/24 Procedure visit Tanya Glisson, MD [facility]  Showing today's visits and meeting all other requirements Future Appointments Date Type Provider Dept  07/21/24 Appointment Tanya Glisson, MD [facility]  Showing future appointments within next 90 days and meeting all other requirements   Disposition: Discharge home  Discharge (Date  Time): 07/06/2024; 1426 hrs.   Primary Care Physician: Bair, Kalpana, MD Location: [facility] Note by: Glisson DELENA Tanya, MD (TTS technology used. I apologize for any typographical errors that were not detected and corrected.) Date: 07/06/2024; Time: 3:23 PM  Disclaimer:  Medicine is not an Visual merchandiser. The only guarantee in medicine is that nothing is guaranteed. It is important to note that the decision to proceed with this intervention was based on the information collected from the patient. The Data and conclusions were drawn from the patient's questionnaire, the interview, and the physical examination. Because the information was provided in large part by the patient, it cannot be guaranteed that it has not been purposely or unconsciously manipulated. Every effort has been made to obtain as much relevant data as possible for this evaluation. It is important to note that the conclusions that lead to this procedure are derived in large part from the available data. Always take into account that the treatment will also be dependent on availability of resources and existing treatment guidelines, considered by other Pain  Management Practitioners as being common knowledge and practice, at the time of the intervention. For Medico-Legal purposes, it is also important to point out that variation in procedural techniques and pharmacological choices are the acceptable norm. The indications, contraindications, technique, and results of the above procedure should only be interpreted and judged by a Board-Certified Interventional Pain Specialist with extensive familiarity and expertise in the same exact procedure and technique.

## 2024-07-07 ENCOUNTER — Telehealth: Payer: Self-pay | Admitting: *Deleted

## 2024-07-07 NOTE — Telephone Encounter (Signed)
 No problems post procedure.

## 2024-07-14 DIAGNOSIS — S76011D Strain of muscle, fascia and tendon of right hip, subsequent encounter: Secondary | ICD-10-CM | POA: Diagnosis not present

## 2024-07-14 DIAGNOSIS — M160 Bilateral primary osteoarthritis of hip: Secondary | ICD-10-CM | POA: Diagnosis not present

## 2024-07-14 DIAGNOSIS — W19XXXD Unspecified fall, subsequent encounter: Secondary | ICD-10-CM | POA: Diagnosis not present

## 2024-07-20 NOTE — Progress Notes (Unsigned)
 PROVIDER NOTE: Interpretation of information contained herein should be left to medically-trained personnel. Specific patient instructions are provided elsewhere under Patient Instructions section of medical record. This document was created in part using AI and STT-dictation technology, any transcriptional errors that may result from this process are unintentional.  Patient: Maria Spencer  Service: E/M   PCP: Abbey Bruckner, MD  DOB: 05/11/1946  DOS: 07/21/2024  Provider: Eric DELENA Como, MD  MRN: [id number]  Delivery: Face-to-face  Specialty: Interventional Pain Management  Type: Established Patient  Setting: Ambulatory outpatient facility  Specialty designation: 09  Referring Prov.: Bair, Bruckner, MD  Location: Outpatient office facility       History of present illness (HPI) [name], a 78 y.o. year old female, is here today because of her Chronic pain of left thumb [M79.645, G89.29]. [name] primary complain today is thumb (left)  Pertinent problems: [name] has History of breast cancer; Arthritis; Chronic hip pain (2ry area of Pain) (Bilateral) (R>L); Chronic thoracic back pain (1ry area of Pain) (Bilateral) (R>L); Fusion of spine of lumbosacral region; Chronic pain syndrome; Chronic lower extremity pain (3ry area of Pain) (Right); Spondylosis without myelopathy or radiculopathy, thoracic region; Closed wedge compression fracture of T8 vertebra (HCC); Osteoarthritis of hips (Bilateral); Abnormal thoracolumbar CT myelogram (01/05/2020); 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); DDD (degenerative disc disease), cervical; DDD (degenerative disc disease), thoracic; DDD (degenerative disc disease), lumbar; Displacement of thoracic intervertebral disc (C6-T3, T4-5, T6-7, T8-9); Non-traumatic compression fracture of T8 thoracic vertebra, sequela; Cervicalgia; Chronic upper extremity pain (Right); Cervical spondylosis with radiculopathy (Right); Cervical radiculopathy at C7 (Right);  Chronic shoulder pain (Bilateral) (R>L); Chronic shoulder pain after shoulder replacement (Right); Osteoarthritis of AC (acromioclavicular) joints (Bilateral); Chronic Acromioclavicular (AC) joint pain (Bilateral); Osteoarthritis of glenohumeral joint (Left); Tear of right gluteus minimus tendon; Abnormal MRI, cervical spine (08/06/2022); Cervical facet syndrome (Bilateral); Cervicogenic headache (Bilateral); Occipital neuralgia (greater occipital nerve) (Bilateral); Cervico-occipital neuralgia (Bilateral); Cervical facet hypertrophy (Multilevel) (Bilateral); Spondylosis without myelopathy or radiculopathy, cervical region; Scoliosis of thoracic spine, unspecified scoliosis type; 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/20/2017); Chronic bursitis of shoulder area (Right); Chronic shoulder pain (Right); Shoulder blade pain (Right); Tear of right gluteus medius tendon; Osteoarthritis of knee (Left); Partial tear of right hamstring; Localized osteoarthritis of left knee; Weakness of both lower extremities; Failed orthopedic implant (HCC); S/P spinal fusion; S/P hardware removal; Cervical facet joint pain; Mechanical low back pain; Acquired spondylolisthesis; Arthritis of right knee; Full thickness rotator cuff tear; Spinal stenosis of lumbar region; Tendinopathy of left gluteus medius; Trochanteric bursitis of right hip; Idiopathic peripheral neuropathy; Cervical radiculopathy; Degeneration of lumbar intervertebral disc; Lumbar spondylosis; Lumbosacral radiculitis; Lumbosacral radiculopathy; Lumbosacral spondylosis without myelopathy; Degenerative joint disease of shoulder region; Chronic thumb pain (Left); 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  Pain Assessment: Severity of Chronic pain is reported as a 2 /10. Location: Hand Left/thumb. Onset: More than a month ago. Quality: Throbbing. Timing: Constant. Modifying  factor(s): tylenol . Vitals:  height is 4' 11 (1.499 m) and weight is 141 lb (64 kg). Her temperature is 97.1 F (36.2 C) (abnormal). Her blood pressure is 167/72 (abnormal) and her pulse is 72. Her oxygen saturation is 96%.  BMI: Estimated body mass index is 28.48 kg/m as calculated from the following:   Height as of this encounter: 4' 11 (1.499 m).   Weight as of this encounter: 141 lb (64 kg).  Last encounter: 06/30/2024.  Last procedure: 07/06/2024.  Reason for encounter: post-procedure evaluation and assessment.   Discussed the use of AI scribe software for clinical note transcription with the patient, who gave verbal consent to proceed.  History of Present Illness   Maria Spencer is a 78 year old female who presents for follow-up after a left carpometacarpal joint injection for thumb pain.  Following the left carpometacarpal joint injection on July 06, 2024, she experienced complete pain relief during the local anesthetic's effect. As the anesthetic wore off, pain relief decreased, but she now reports a 70% improvement in left thumb pain. Discomfort persists, especially at night, but her range of motion has improved, allowing for more free movement of the thumb.      Post-Procedure Evaluation    Procedure:          Anesthesia, Analgesia, Anxiolysis:  Type: Therapeutic Small Joint (CPT 20600) Steroid Injection  #1  Laterality: Left  Type: Local Anesthesia Local Anesthetic: Lidocaine  1-2% Sedation: None  Indication(s):  Analgesia Route: Infiltration ([place]/IM) IV Access: N/A   Patient position: Sitting Extremity position: Supine   Target Area: Lateral aspect Target: First (thumb) CMC (Carpometacarpal)   Approach: Lateral approach.   1. 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 Chronic thumb pain (Left)   3. Chronic hand pain (Left)   4. Primary osteoarthritis of first carpometacarpal joint of left hand   5. Chronic pain of left thumb   6.  Chronic hand pain, left    NAS-11 Pain score:   Pre-procedure: 5 /10   Post-procedure: 0-No pain/10     Effectiveness:  Initial hour after procedure: 100 %. Subsequent 4-6 hours post-procedure: 100 %. Analgesia past initial 6 hours: 70 % (ongoing). Ongoing improvement:  Analgesic: The patient indicates having attained 100% relief of the pain for the duration of the local anesthetic which later started to go down to an ongoing 70% improvement of the pain that she was experiencing on her left thumb area. Function: Ms. Shor reports improvement in function ROM: Ms. Hammerstrom reports improvement in ROM  Pharmacotherapy Assessment   Opioid Analgesic: No opioid analgesics prescribed by our practice. MME/day: 0 mg/day   Monitoring: [place] PMP: PDMP reviewed during this encounter.       Pharmacotherapy: No side-effects or adverse reactions reported. Compliance: No problems identified. Effectiveness: Clinically acceptable.  No notes on file  UDS:  No results found for: SUMMARY  No results found for: CBDTHCR No results found for: D8THCCBX No results found for: D9THCCBX  ROS  Constitutional: Denies any fever or chills Gastrointestinal: No reported hemesis, hematochezia, vomiting, or acute GI distress Musculoskeletal: Denies any acute onset joint swelling, redness, loss of ROM, or weakness Neurological: No reported episodes of acute onset apraxia, aphasia, dysarthria, agnosia, amnesia, paralysis, loss of coordination, or loss of consciousness  Medication Review  Multi-Vitamins, atorvastatin , levothyroxine , metoprolol  succinate, and telmisartan   History Review  Allergy: Ms. Raineri is allergic to sulfa antibiotics, nitrofuran derivatives, amlodipine  besylate, and augmentin  [amoxicillin -pot clavulanate]. Drug: Ms. Carte  reports no history of drug use. Alcohol:  reports that she does not currently use alcohol. Tobacco:  reports that she has never smoked. She has never been exposed to  tobacco smoke. She has never used smokeless tobacco. Social: Ms. Bellmore  reports that she has never smoked. She has never been exposed to tobacco smoke. She has never used smokeless tobacco. She reports that she does not currently use alcohol. She reports that she does not use drugs. Medical:  has a past medical history of Arthritis, Breast cancer (HCC) (2009), Chicken pox, Chronic UTI, COVID-19, Diverticular disease, Diverticulitis, Esophagitis, GERD (gastroesophageal reflux disease), History of kidney stones, Hormone disorder, Hyperlipidemia, Hypertension, Hypothyroidism, LVH (left ventricular hypertrophy), MR (mitral regurgitation), Osteoporosis, PAC (premature atrial contraction), Personal history of radiation therapy (2009), PSVT (paroxysmal supraventricular tachycardia) (HCC), PVC (premature ventricular contraction), Sepsis (HCC), and Thyroid  disease. Surgical: Ms. Cozza  has a past surgical history that includes Total shoulder replacement (Right, 2012); Shoulder surgery (Left, 2008, 2009); Tonsillectomy; Bone graft hip iliac crest (2018); Spine surgery (10/23/2017); Ventral hernia repair (N/A, 05/29/2018); Insertion of mesh (N/A, 05/29/2018); Cardiac catheterization (2001); Breast lumpectomy (Right, 2009); Breast biopsy (Left, 2009); Breast biopsy (Right, 2009); Colonoscopy with propofol  (N/A, 05/15/2020); Spinal fusion (07/24/2020); Cataract extraction, bilateral (Bilateral, 10/2019); Tubal ligation; Quadriceps tendon repair (Right, 12/05/2020); right gluteus medius/minimus repair Dr. Edie 11/2020 ; Total knee arthroplasty (Left, 03/18/2022); Hardware Removal (N/A, 03/12/2023); and Colon surgery (03/2023). Family: family history includes AAA (abdominal aortic aneurysm) in her father; Arthritis in her father, mother, sister, and sister; Cancer in her brother and father; Coronary artery disease (age of onset: 109) in her mother; Diabetes in her father; Drug abuse in her daughter; Heart disease (age of onset:  26) in her mother; Hyperlipidemia in her mother, sister, and sister; Hypertension in her mother and sister; Mental retardation in her brother.  Laboratory Chemistry Profile   Renal Lab Results  Component Value Date   BUN 18 03/31/2024   CREATININE 0.54 (L) 03/31/2024   BCR 33 (H) 03/31/2024   GFR 91.09 05/08/2023   GFRAA >60 05/08/2020   GFRNONAA >60 04/20/2023    Hepatic Lab Results  Component Value Date   AST 29 03/31/2024   ALT 25 03/31/2024   ALBUMIN 4.5 03/31/2024   ALKPHOS 113 03/31/2024   LIPASE 26 03/15/2021    Electrolytes Lab Results  Component Value Date   NA 139 03/31/2024   K 4.7 03/31/2024   CL 99 03/31/2024   CALCIUM  9.7 03/31/2024   MG 2.0 05/08/2020    Bone Lab Results  Component Value Date   VD25OH 95.34 05/08/2023    Inflammation (CRP: Acute Phase) (ESR: Chronic Phase) Lab Results  Component Value Date   CRP 0.6 05/08/2020   ESRSEDRATE 5 05/08/2020   LATICACIDVEN 0.7 03/15/2021         Note: Above Lab results reviewed.  Recent Imaging Review  DG PAIN CLINIC C-ARM 1-60 MIN NO REPORT Fluoro was used, but no Radiologist interpretation will be provided.  Please refer to NOTES tab for provider progress note. Note: Reviewed        Physical Exam  Vitals: BP (!) 167/72   Pulse 72   Temp (!) 97.1 F (36.2 C)   Ht 4' 11 (1.499 m)   Wt 141 lb (64 kg)   SpO2 96%   BMI 28.48 kg/m  BMI: Estimated body mass index is 28.48 kg/m as calculated from the following:   Height as of this encounter: 4' 11 (1.499 m).   Weight as of this encounter: 141 lb (64 kg). Ideal: Patient must be at least 60 in tall to calculate ideal body weight General appearance: Well nourished, well developed, and well hydrated. In no apparent acute distress Mental status: Alert, oriented x 3 (person, place, & time)       Respiratory: No evidence of acute respiratory distress Eyes: PERLA   Assessment   Diagnosis Status  1. Chronic thumb pain (Left)   2. Chronic hand  pain (Left)   3. 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 Postop check    Controlled Controlled Controlled   Updated Problems: No problems updated.  Plan of Care  Problem-specific:  Assessment and Plan    Left thumb carpometacarpal joint osteoarthritis   Chronic osteoarthritis in the left thumb carpometacarpal joint shows significant improvement after a recent injection, with 70% pain reduction and better range of motion. Pain persists, especially at night, but is much less than before the injection. Arthritis can flare up; early intervention enhances treatment effectiveness. Consider repeat steroid injection if symptoms worsen.       Ms. Ameia Morency has a current medication list which includes the following long-term medication(s): levothyroxine , metoprolol  succinate, and telmisartan .  Pharmacotherapy (Medications Ordered): No orders of the defined types were placed in this encounter.  Orders:  Orders Placed This Encounter  Procedures   Nursing Instructions:    Please complete this patient's postprocedure evaluation.    Scheduling Instructions:     Please complete this patient's postprocedure evaluation.     Interventional Therapies  Risk Factors  Considerations:   Aortic atherosclerosis  osteoporosis  HTN  GERD  palpitations  prediabetes  SOB  Hx. of breast cancer           Planned  Pending:   Diagnostic/therapeutic left thumb joint inj. #1  Diagnostic x-rays of the left thumb ordered today (06/30/2024). (02/05/2023) TENS ordered.  However, she did not seem to have obtained that unit.  Today (06/30/2024) I have again provided her with written information about a TENS unit and how to order there that through [employer].   Under consideration:  Therapeutic right cervical ESI #3   Diagnostic left inferolateral and recurrent genicular NB #1  Therapeutic left inferolateral and recurrent genicular nerve RFA #1  Diagnostic right T9-10 thoracic  ESI #1  Diagnostic bilateral T9, T10, T11, and T12 MMB thoracic facet RFA (DIFICULT CANDIDATE DUE TO HARDWARE)    Completed:   Therapeutic left cervical facet MB (3-C7) RFA x1 (09/09/2023) (100/100/50/20)  Therapeutic right cervical facet MB (3-C7) RFA x1 (08/05/2023) (100/100/50/50)  Diagnostic left genicular NB x1 (07/03/2021) (100/100/50/50)  Therapeutic left genicular nerve RFA x1 (08/09/2021) (100/100/30/0)  Diagnostic left cervical facet MBB x1 (10/26/2020) (100/100/0)  Diagnostic right cervical facet MBB x2 (03/25/2023) (100/100/50 x 3 wks/ 0)   Diagnostic bilateral suprascapular NB x1 (10/05/2020) (100/100/100 x1 day/0)  Diagnostic right cervical ESI x2 (06/15/2024) (100/90/UEP:80Neck:20)  Diagnostic/therapeutic bilateral T9-T12 thoracic facet MBB x2 (06/06/2020) (100/100/0)  Diagnostic/Therapeutic right trapezoid bursa injection x1 (11/09/2020) (did not follow-up)    Therapeutic  Palliative (PRN) options:   Therapeutic/palliative bilateral T9-12 thoracic facet MBB #3  Therapeutic/palliative right trapezoid bursa injection #2      Return if symptoms worsen or fail to improve.    Recent Visits Date Type Provider Dept  07/06/24 Procedure visit Tanya Glisson, MD [facility]  06/30/24 Office Visit Tanya Glisson, MD [facility]  06/15/24 Procedure visit Tanya Glisson, MD [facility]  06/14/24 Office Visit Tanya Glisson, MD [facility]  Showing recent visits within past 90 days and meeting all other requirements Today's Visits Date Type Provider Dept  07/21/24 Office Visit Tanya Glisson, MD [facility]  Showing today's visits and meeting all other requirements Future Appointments No visits were found meeting these conditions. Showing future appointments within next 90 days and meeting all other requirements  I discussed the assessment and treatment plan with the patient. The patient  was provided an  opportunity to ask questions and all were answered. The patient agreed with the plan and demonstrated an understanding of the instructions.  Patient advised to call back or seek an in-person evaluation if the symptoms or condition worsens.  Duration of encounter: 30 minutes.  Total time on encounter, as per AMA guidelines included both the face-to-face and non-face-to-face time personally spent by the physician and/or other qualified health care professional(s) on the day of the encounter (includes time in activities that require the physician or other qualified health care professional and does not include time in activities normally performed by clinical staff). Physician's time may include the following activities when performed: Preparing to see the patient (e.g., pre-charting review of records, searching for previously ordered imaging, lab work, and nerve conduction tests) Review of prior analgesic pharmacotherapies. Reviewing PMP Interpreting ordered tests (e.g., lab work, imaging, nerve conduction tests) Performing post-procedure evaluations, including interpretation of diagnostic procedures Obtaining and/or reviewing separately obtained history Performing a medically appropriate examination and/or evaluation Counseling and educating the patient/family/caregiver Ordering medications, tests, or procedures Referring and communicating with other health care professionals (when not separately reported) Documenting clinical information in the electronic or other health record Independently interpreting results (not separately reported) and communicating results to the patient/ family/caregiver Care coordination (not separately reported)  Note by: Eric DELENA Como, MD (TTS and AI technology used. I apologize for any typographical errors that were not detected and corrected.) Date: 07/21/2024; Time: 1:03 PM

## 2024-07-21 ENCOUNTER — Encounter: Payer: Self-pay | Admitting: Pain Medicine

## 2024-07-21 ENCOUNTER — Ambulatory Visit: Attending: Pain Medicine | Admitting: Pain Medicine

## 2024-07-21 VITALS — BP 167/72 | HR 72 | Temp 97.1°F | Ht 59.0 in | Wt 141.0 lb

## 2024-07-21 DIAGNOSIS — G8929 Other chronic pain: Secondary | ICD-10-CM | POA: Diagnosis not present

## 2024-07-21 DIAGNOSIS — M79645 Pain in left finger(s): Secondary | ICD-10-CM | POA: Insufficient documentation

## 2024-07-21 DIAGNOSIS — M79642 Pain in left hand: Secondary | ICD-10-CM | POA: Insufficient documentation

## 2024-07-21 DIAGNOSIS — M1812 Unilateral primary osteoarthritis of first carpometacarpal joint, left hand: Secondary | ICD-10-CM | POA: Insufficient documentation

## 2024-07-21 DIAGNOSIS — Z09 Encounter for follow-up examination after completed treatment for conditions other than malignant neoplasm: Secondary | ICD-10-CM | POA: Insufficient documentation

## 2024-07-21 NOTE — Patient Instructions (Signed)
 PRN. Will call if another appointment is needed

## 2024-08-02 DIAGNOSIS — M67854 Other specified disorders of tendon, left hip: Secondary | ICD-10-CM | POA: Diagnosis not present

## 2024-08-02 DIAGNOSIS — M160 Bilateral primary osteoarthritis of hip: Secondary | ICD-10-CM | POA: Diagnosis not present

## 2024-08-02 DIAGNOSIS — M25552 Pain in left hip: Secondary | ICD-10-CM | POA: Diagnosis not present

## 2024-08-02 DIAGNOSIS — M25551 Pain in right hip: Secondary | ICD-10-CM | POA: Diagnosis not present

## 2024-08-11 DIAGNOSIS — M25552 Pain in left hip: Secondary | ICD-10-CM | POA: Diagnosis not present

## 2024-08-11 DIAGNOSIS — M25551 Pain in right hip: Secondary | ICD-10-CM | POA: Diagnosis not present

## 2024-08-17 DIAGNOSIS — M25551 Pain in right hip: Secondary | ICD-10-CM | POA: Insufficient documentation

## 2024-08-24 ENCOUNTER — Other Ambulatory Visit: Payer: Self-pay

## 2024-08-24 MED ORDER — ATORVASTATIN CALCIUM 10 MG PO TABS: 10.0000 mg | ORAL_TABLET | Freq: Every day | ORAL | 0 refills | Status: DC

## 2024-08-25 DIAGNOSIS — M25552 Pain in left hip: Secondary | ICD-10-CM | POA: Diagnosis not present

## 2024-08-25 DIAGNOSIS — R202 Paresthesia of skin: Secondary | ICD-10-CM | POA: Diagnosis not present

## 2024-08-25 DIAGNOSIS — R200 Anesthesia of skin: Secondary | ICD-10-CM | POA: Diagnosis not present

## 2024-08-30 ENCOUNTER — Telehealth: Payer: Self-pay

## 2024-08-30 ENCOUNTER — Other Ambulatory Visit: Payer: Self-pay

## 2024-08-30 DIAGNOSIS — E039 Hypothyroidism, unspecified: Secondary | ICD-10-CM

## 2024-08-30 DIAGNOSIS — I10 Essential (primary) hypertension: Secondary | ICD-10-CM

## 2024-08-30 MED ORDER — LEVOTHYROXINE SODIUM 88 MCG PO TABS
88.0000 ug | ORAL_TABLET | Freq: Every day | ORAL | 0 refills | Status: DC
Start: 2024-08-30 — End: 2024-10-01

## 2024-08-30 MED ORDER — TELMISARTAN 80 MG PO TABS: 80.0000 mg | ORAL_TABLET | Freq: Every day | ORAL | 0 refills | Status: DC

## 2024-08-30 NOTE — Telephone Encounter (Signed)
 Noted

## 2024-08-30 NOTE — Telephone Encounter (Signed)
 Copied from CRM #[id number]. Topic: Clinical - Prescription Issue >> Aug 30, 2024 10:02 AM Burnard DEL wrote: Reason for CRM: Patient would like for all of her prescriptions  sent to    [facility] - [place], [place] - [address]  Phone: [phone] Fax: [phone]  Going forward. A few were sent to [facility]. She is not needing refill on anything a this time however.

## 2024-08-31 DIAGNOSIS — Z23 Encounter for immunization: Secondary | ICD-10-CM | POA: Diagnosis not present

## 2024-09-07 ENCOUNTER — Encounter: Payer: Self-pay | Admitting: Neurosurgery

## 2024-09-07 ENCOUNTER — Ambulatory Visit (INDEPENDENT_AMBULATORY_CARE_PROVIDER_SITE_OTHER): Admitting: Neurosurgery

## 2024-09-07 VITALS — BP 136/84 | Ht 59.0 in | Wt 144.5 lb

## 2024-09-07 DIAGNOSIS — G8929 Other chronic pain: Secondary | ICD-10-CM

## 2024-09-07 DIAGNOSIS — M542 Cervicalgia: Secondary | ICD-10-CM

## 2024-09-07 DIAGNOSIS — M4802 Spinal stenosis, cervical region: Secondary | ICD-10-CM

## 2024-09-07 DIAGNOSIS — M25512 Pain in left shoulder: Secondary | ICD-10-CM | POA: Diagnosis not present

## 2024-09-07 DIAGNOSIS — M5412 Radiculopathy, cervical region: Secondary | ICD-10-CM

## 2024-09-07 DIAGNOSIS — M4312 Spondylolisthesis, cervical region: Secondary | ICD-10-CM

## 2024-09-07 MED ORDER — GABAPENTIN 100 MG PO CAPS: 100.0000 mg | ORAL_CAPSULE | Freq: Three times a day (TID) | ORAL | 0 refills | Status: DC

## 2024-09-07 NOTE — Progress Notes (Signed)
   REFERRING PHYSICIAN:  Abbey Bruckner, Md [address] [place],  [place] [address]  DOS: 03/12/23 removal of iliac screws   HISTORY OF PRESENT ILLNESS: Maria Spencer continues to have pain in her lower back, tightness around her lower ribcage, and neck pain extending more into her L than R shoulder.  This has been ongoing for some time.  She has not had a great response to the extension of her fusion or the removal of her implants.  She saw her orthopedic surgeon who did not feel that her symptoms were from her hip.  She has had a nerve conduction study which showed evidence of a chronic radiculopathy.        PHYSICAL EXAMINATION:  General: Patient is well developed, well nourished, calm, collected, and in no apparent distress.   NEUROLOGICAL:  General: In no acute distress.   Awake, alert, oriented to person, place, and time.  Pupils equal round and reactive to light.  Facial tone is symmetric.    Strength:            Side Iliopsoas Quads Hamstring PF DF EHL  R 5 5 5 5 5 5   L 5 5 5 5 5 5      ROS (Neurologic):  Negative except as noted above  IMAGING: Older imaging reviewed-no new imaging to review today.  ASSESSMENT/PLAN:  Maria Spencer has some improvements from her removal of sacroiliac fixation.    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  She has neck pain with significant shoulder pain.  Some of this may be radicular from her foraminal stenosis and C4 on C5 anterolisthesis.  I would like to start some gabapentin  and start her on physical therapy.  Have asked her to contact me when she starts physical therapy so that we can set up a phone visit in approximately 6 weeks after she starts physical therapy.  If she does not improve, we will repeat her cervical spine MRI.  I spent a total of 15 minutes in this patient's care today. This time was spent reviewing pertinent records including imaging studies, obtaining and confirming history, performing  a directed evaluation, formulating and discussing my recommendations, and documenting the visit within the medical record.    Maria Daisy MD Department of neurosurgery

## 2024-09-27 DIAGNOSIS — M1612 Unilateral primary osteoarthritis, left hip: Secondary | ICD-10-CM | POA: Diagnosis not present

## 2024-09-27 DIAGNOSIS — M1712 Unilateral primary osteoarthritis, left knee: Secondary | ICD-10-CM | POA: Diagnosis not present

## 2024-10-04 ENCOUNTER — Ambulatory Visit: Payer: Self-pay

## 2024-10-04 ENCOUNTER — Ambulatory Visit (INDEPENDENT_AMBULATORY_CARE_PROVIDER_SITE_OTHER)

## 2024-10-04 VITALS — BP 128/82 | HR 73 | Temp 98.1°F | Ht 59.0 in | Wt 145.0 lb

## 2024-10-04 DIAGNOSIS — E039 Hypothyroidism, unspecified: Secondary | ICD-10-CM | POA: Diagnosis not present

## 2024-10-04 DIAGNOSIS — L609 Nail disorder, unspecified: Secondary | ICD-10-CM | POA: Diagnosis not present

## 2024-10-04 DIAGNOSIS — R7309 Other abnormal glucose: Secondary | ICD-10-CM | POA: Insufficient documentation

## 2024-10-04 DIAGNOSIS — E782 Mixed hyperlipidemia: Secondary | ICD-10-CM | POA: Diagnosis not present

## 2024-10-04 DIAGNOSIS — G894 Chronic pain syndrome: Secondary | ICD-10-CM

## 2024-10-04 DIAGNOSIS — R1013 Epigastric pain: Secondary | ICD-10-CM | POA: Insufficient documentation

## 2024-10-04 DIAGNOSIS — I10 Essential (primary) hypertension: Secondary | ICD-10-CM | POA: Diagnosis not present

## 2024-10-04 DIAGNOSIS — E538 Deficiency of other specified B group vitamins: Secondary | ICD-10-CM | POA: Diagnosis not present

## 2024-10-04 DIAGNOSIS — M810 Age-related osteoporosis without current pathological fracture: Secondary | ICD-10-CM | POA: Diagnosis not present

## 2024-10-04 DIAGNOSIS — E785 Hyperlipidemia, unspecified: Secondary | ICD-10-CM

## 2024-10-04 LAB — COMPREHENSIVE METABOLIC PANEL WITH GFR
ALT: 21 U/L (ref 0–35)
AST: 20 U/L (ref 0–37)
Albumin: 4.3 g/dL (ref 3.5–5.2)
Alkaline Phosphatase: 95 U/L (ref 39–117)
BUN: 30 mg/dL — ABNORMAL HIGH (ref 6–23)
CO2: 30 meq/L (ref 19–32)
Calcium: 9.5 mg/dL (ref 8.4–10.5)
Chloride: 101 meq/L (ref 96–112)
Creatinine, Ser: 0.58 mg/dL (ref 0.40–1.20)
GFR: 87.02 mL/min (ref >60.00)
Glucose, Bld: 100 mg/dL — ABNORMAL HIGH (ref 70–99)
Potassium: 4.1 meq/L (ref 3.5–5.1)
Sodium: 139 meq/L (ref 135–145)
Total Bilirubin: 0.7 mg/dL (ref 0.2–1.2)
Total Protein: 6.8 g/dL (ref 6.0–8.3)

## 2024-10-04 LAB — LIPID PANEL
Cholesterol: 189 mg/dL (ref 0–200)
HDL: 61.7 mg/dL (ref >39.00)
LDL Cholesterol: 99 mg/dL (ref 0–99)
NonHDL: 127.72
Total CHOL/HDL Ratio: 3
Triglycerides: 142 mg/dL (ref 0.0–149.0)
VLDL: 28.4 mg/dL (ref 0.0–40.0)

## 2024-10-04 LAB — VITAMIN B12: Vitamin B-12: 266 pg/mL (ref 211–911)

## 2024-10-04 LAB — HEMOGLOBIN A1C: Hgb A1c MFr Bld: 6 % (ref 4.6–6.5)

## 2024-10-04 LAB — TSH: TSH: 2.6 u[IU]/mL (ref 0.35–5.50)

## 2024-10-04 LAB — LIPASE: Lipase: 20 U/L (ref 11.0–59.0)

## 2024-10-04 MED ORDER — ATORVASTATIN CALCIUM 10 MG PO TABS: 10.0000 mg | ORAL_TABLET | Freq: Every day | ORAL | 3 refills | Status: AC

## 2024-10-04 MED ORDER — LEVOTHYROXINE SODIUM 88 MCG PO TABS: 88.0000 ug | ORAL_TABLET | Freq: Every day | ORAL | 0 refills | Status: DC

## 2024-10-04 MED ORDER — METOPROLOL SUCCINATE ER 50 MG PO TB24: 50.0000 mg | ORAL_TABLET | Freq: Every day | ORAL | 3 refills | Status: AC

## 2024-10-04 MED ORDER — TELMISARTAN 80 MG PO TABS: 80.0000 mg | ORAL_TABLET | Freq: Every day | ORAL | 3 refills | Status: AC

## 2024-10-04 NOTE — Assessment & Plan Note (Addendum)
 Previous blood glucose mildly elevated. Will check A1c today.  Orders:   Hemoglobin A1c

## 2024-10-04 NOTE — Assessment & Plan Note (Addendum)
 Previous B12 level from 05/08/2023 was 285, ideal B12 level around 400. Check B 12 level. Continue daily multivitamin.  Orders:   B12

## 2024-10-04 NOTE — Assessment & Plan Note (Signed)
 Chronic, management per endocrinologist at [facility] Dr. Damian. Planning on 5th annual infusion with Reclast  in 11/2024 and repeat DEXA around 11/2026.

## 2024-10-04 NOTE — Progress Notes (Signed)
 Established Patient Office Visit TOC from Dr. Otelia. Randine Neurosurgery: Dr. Katrina Pain specialist: Dr. Tanya Cardiologist: Dr. Mady  Endocrinologist: Dr. Damian Maria)  Orthopedic: Dr. Leora ([facility])     Subjective  Patient ID: Maria Spencer, female    DOB: [birth year]  Age: 78 y.o. MRN: [id number]  Chief Complaint  Patient presents with   Establish Care    Discussed the use of AI scribe software for clinical note transcription with the patient, who gave verbal consent to proceed.  History of Present Illness Maria Spencer is a 78 year old female with a history of osteoarthritis, osteoporosis and multiple back surgeries, hypertension, hyperlipidemia, right lower extremity weakness needing a cane for ambulation who presents for transfer of care from previous PCP.    - She experiences chronic pain primarily in her shoulders and neck, described as a constant throbbing nerve pain that radiates up her neck. She has a history of multiple back surgeries, including a spinal fusion on her thoracic spine, which she believes contributes to her symptoms. Gabapentin  100 mg three times a day was previously prescribed but was ineffective, and she has since discontinued it. She has not been able to attend physical therapy due to scheduling issues. She follow up with neurosurgery Dr. Katrina and is not interested in more surgeries at this time.    - She reports of bandlike discomfort ongoing for years now and describes a sensation of tightness around her chest, akin to wearing a brace. This symptom has been present since her thoracic spine surgery, which she attributes to a fall while caring for her husband. She uses a cane for ambulation and reports difficulty walking due to lack of muscle mass in her hip area on the right side following a gluteal tear repair. She continues to follow up with ortho Dr. Ezzard.   - Her past medical history includes osteoarthritis, for which she has  received multiple surgeries, and a history of diverticulitis, for which she underwent a sigmoid colectomy. She also has a history of thyroid  issues managed with Synthroid  88 mcg daily, and hyperlipidemia managed with Lipitor 10 mg daily. She takes telmisartan  80 mg and metoprolol  succinate 50 mg daily for blood pressure management. She denies chest pain, palpitations, lower leg edema. Does not check BP at home. She follows up with cardiologist Dr. Mady on an annual basis. She had echocardiogram in 04/2024 which was reassuring.   - She has a history of osteoporosis, managed with Reclast  infusions, with her final infusion scheduled for January 2026. She reports a history of urinary tract infections, which have resolved with the use of D-mannose. She also reports a fungal infection on her toenails, which has been persistent despite over-the-counter treatments.  - Her social history includes living alone in [place], with a supportive neighborhood community. She is active in her church and a widow's group, and has children living out of state. She reports difficulty sleeping due to racing thoughts and concerns about her future living situation.    ROS As per HPI    Objective:     BP 128/82   Pulse 73   Temp 98.1 F (36.7 C) (Oral)   Ht 4' 11 (1.499 m)   Wt 145 lb (65.8 kg)   SpO2 97%   BMI 29.29 kg/m      10/04/2024    9:05 AM 07/21/2024   12:54 PM 07/06/2024    1:51 PM  Depression screen PHQ 2/9  Decreased Interest 0 0 0  Down,  Depressed, Hopeless 0 0 0  PHQ - 2 Score 0 0 0  Altered sleeping 2    Tired, decreased energy 2    Change in appetite 0    Feeling bad or failure about yourself  0    Trouble concentrating 0    Moving slowly or fidgety/restless 0    Suicidal thoughts 0    PHQ-9 Score 4    Difficult doing work/chores Not difficult at all        10/04/2024    9:05 AM 08/11/2023    9:51 AM 05/01/2023    3:42 PM 12/17/2022   11:41 AM  GAD 7 : Generalized Anxiety Score   Nervous, Anxious, on Edge 0 0 0 0  Control/stop worrying 0 1 1 0  Worry too much - different things 0 1 1 0  Trouble relaxing 0 0 1 0  Restless 0 0 0 0  Easily annoyed or irritable 0 0 0 0  Afraid - awful might happen 0 0 0 0  Total GAD 7 Score 0 2 3 0  Anxiety Difficulty  Not difficult at all Not difficult at all Not difficult at all      10/04/2024    9:05 AM 07/21/2024   12:54 PM 07/06/2024    1:51 PM  Depression screen PHQ 2/9  Decreased Interest 0 0 0  Down, Depressed, Hopeless 0 0 0  PHQ - 2 Score 0 0 0  Altered sleeping 2    Tired, decreased energy 2    Change in appetite 0    Feeling bad or failure about yourself  0    Trouble concentrating 0    Moving slowly or fidgety/restless 0    Suicidal thoughts 0    PHQ-9 Score 4    Difficult doing work/chores Not difficult at all        10/04/2024    9:05 AM 08/11/2023    9:51 AM 05/01/2023    3:42 PM 12/17/2022   11:41 AM  GAD 7 : Generalized Anxiety Score  Nervous, Anxious, on Edge 0 0 0 0  Control/stop worrying 0 1 1 0  Worry too much - different things 0 1 1 0  Trouble relaxing 0 0 1 0  Restless 0 0 0 0  Easily annoyed or irritable 0 0 0 0  Afraid - awful might happen 0 0 0 0  Total GAD 7 Score 0 2 3 0  Anxiety Difficulty  Not difficult at all Not difficult at all Not difficult at all   SDOH Screenings   Food Insecurity: No Food Insecurity (09/30/2024)  Housing: Low Risk  (09/30/2024)  Transportation Needs: No Transportation Needs (09/30/2024)  Recent Concern: Transportation Needs - Unmet Transportation Needs (07/11/2024)   Received from [facility]  Utilities: Not At Risk (07/11/2024)   Received from [facility]  Alcohol Screen: Low Risk  (09/30/2024)  Depression (PHQ2-9): Low Risk  (10/04/2024)  Financial Resource Strain: Low Risk  (09/30/2024)  Physical Activity: Inactive (09/30/2024)  Social Connections: Moderately Integrated (09/30/2024)  Stress: No Stress Concern Present  (09/30/2024)  Tobacco Use: Low Risk  (09/07/2024)  Health Literacy: Adequate Health Literacy (06/16/2023)     Physical Exam Constitutional:      General: She is not in acute distress.    Appearance: Normal appearance.  HENT:     Head: Normocephalic and atraumatic.     Right Ear: Tympanic membrane normal. There is no impacted cerumen.     Left  Ear: Tympanic membrane normal. There is no impacted cerumen.     Mouth/Throat:     Mouth: Mucous membranes are moist.  Neck:     Thyroid : No thyroid  mass or thyroid  tenderness.  Cardiovascular:     Rate and Rhythm: Normal rate and regular rhythm.  Pulmonary:     Effort: Pulmonary effort is normal.     Breath sounds: Normal breath sounds. No wheezing.  Abdominal:     General: Bowel sounds are normal.     Palpations: Abdomen is soft.     Tenderness: There is no guarding or rebound.  Musculoskeletal:     Cervical back: Neck supple. No rigidity or tenderness.     Right lower leg: No edema.     Left lower leg: No edema.  Lymphadenopathy:     Cervical: No cervical adenopathy.  Skin:    General: Skin is warm.  Neurological:     Mental Status: She is alert and oriented to person, place, and time.     Gait: Gait abnormal (antalgic gait, shortened stance with less weight-bearing on the right lower extremity compared to the left. No foot drop noted. Using a cane for ambulation.).  Psychiatric:        Mood and Affect: Mood normal.        Behavior: Behavior normal.        No results found for any visits on 10/04/24.  The 10-year ASCVD risk score (Arnett DK, et al., 2019) is: 25.4%     Assessment & Plan:   Assessment & Plan Essential hypertension BP stable. Continue current medications. Will check CMP today. Continue annual follow up with cardiologist.  Orders:   metoprolol  succinate (TOPROL -XL) 50 MG 24 hr tablet; Take 1 tablet (50 mg total) by mouth daily. Take with or immediately following a meal.   telmisartan  (MICARDIS ) 80 MG  tablet; Take 1 tablet (80 mg total) by mouth daily.   Comprehensive metabolic panel  Acquired hypothyroidism Chronic, no h/o thyroid  surgery/radiation. Repeat TSH today. Continue Levothyroxine  88 mcg first thing in the morning.  Orders:   levothyroxine  (SYNTHROID ) 88 MCG tablet; Take 1 tablet (88 mcg total) by mouth daily before breakfast. 30 minutes   TSH  Abnormal glucose Previous blood glucose mildly elevated. Will check A1c today.  Orders:   Hemoglobin A1c  Mixed hyperlipidemia Tolerating Atorvastatin  10 mg at bedtime, continue. Check lipid panel, CMP today.  Orders:   atorvastatin  (LIPITOR) 10 MG tablet; Take 1 tablet (10 mg total) by mouth daily.   Comprehensive metabolic panel   Lipid panel  Epigastric abdominal pain Band like pain ongoing since spine surgery. Will rule out pancreatitis by checking lipase today. Given chronicity and stable symptoms with reassuring echocardiogram in 04/2024 less likely to be related to cardiac origin. Consider chest x-ray, PFT in the future if symptoms changes from baseline.  Orders:   Lipase  Low serum vitamin B12 Previous B12 level from 05/08/2023 was 285, ideal B12 level around 400. Check B 12 level. Continue daily multivitamin.  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 Referral to podiatry made today for further evaluation and management.  Orders:   Ambulatory referral to Podiatry  Post-menopausal osteoporosis Chronic, management per endocrinologist at [facility] Dr. Damian. Planning on 5th annual infusion with Reclast  in 11/2024 and repeat DEXA around 11/2026.     Chronic pain syndrome Chronic  pain and weakness post thoracic spinal fusion and gluteal tendon rupture on right. Gabapentin  was ineffective. Physical therapy not initiated due to scheduling issues. Concerns about potential  need for further imaging or surgical intervention for neck pain, shoulder pain. Prefers to avoid further surgery. Contact neurosurgeon to discuss ineffectiveness of gabapentin  and lack of physical therapy. Use walker to prevent falls. Continue follow up with pain clinic for chronic pain management.      I personally spent a total of 50 minutes in the care of the patient today including preparing to see the patient, getting/reviewing separately obtained history, performing a medically appropriate exam/evaluation, counseling and educating, placing orders, documenting clinical information in the EHR, independently interpreting results, and communicating results.    Return in about 5 months (around 03/04/2025) for Chronic follow up .   Luke Shade, MD

## 2024-10-04 NOTE — Assessment & Plan Note (Addendum)
 Band like pain ongoing since spine surgery. Will rule out pancreatitis by checking lipase today. Given chronicity and stable symptoms with reassuring echocardiogram in 04/2024 less likely to be related to cardiac origin. Consider chest x-ray, PFT in the future if symptoms changes from baseline.  Orders:   Lipase

## 2024-10-04 NOTE — Assessment & Plan Note (Addendum)
 BP stable. Continue current medications. Will check CMP today. Continue annual follow up with cardiologist.  Orders:   metoprolol  succinate (TOPROL -XL) 50 MG 24 hr tablet; Take 1 tablet (50 mg total) by mouth daily. Take with or immediately following a meal.   telmisartan  (MICARDIS ) 80 MG tablet; Take 1 tablet (80 mg total) by mouth daily.   Comprehensive metabolic panel

## 2024-10-04 NOTE — Assessment & Plan Note (Signed)
 Chronic pain and weakness post thoracic spinal fusion and gluteal tendon rupture on right. Gabapentin  was ineffective. Physical therapy not initiated due to scheduling issues. Concerns about potential need for further imaging or surgical intervention for neck pain, shoulder pain. Prefers to avoid further surgery. Contact neurosurgeon to discuss ineffectiveness of gabapentin  and lack of physical therapy. Use walker to prevent falls. Continue follow up with pain clinic for chronic pain management.

## 2024-10-04 NOTE — Assessment & Plan Note (Addendum)
 Tolerating Atorvastatin  10 mg at bedtime, continue. Check lipid panel, CMP today.  Orders:   atorvastatin  (LIPITOR) 10 MG tablet; Take 1 tablet (10 mg total) by mouth daily.   Comprehensive metabolic panel   Lipid panel

## 2024-10-04 NOTE — Assessment & Plan Note (Addendum)
 Chronic, no h/o thyroid  surgery/radiation. Repeat TSH today. Continue Levothyroxine  88 mcg first thing in the morning.  Orders:   levothyroxine  (SYNTHROID ) 88 MCG tablet; Take 1 tablet (88 mcg total) by mouth daily before breakfast. 30 minutes   TSH

## 2024-10-04 NOTE — Patient Instructions (Addendum)
 I am referring you to podiatrist. If you do not hear from us  within 2 weeks please reach out to our clinic.   Follow up with Dr. Abbey in 5 months, sooner if you need me.

## 2024-10-04 NOTE — Assessment & Plan Note (Addendum)
 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 Referral to podiatry made today for further evaluation and management.  Orders:   Ambulatory referral to Podiatry

## 2024-10-05 ENCOUNTER — Encounter

## 2024-10-13 DIAGNOSIS — M1612 Unilateral primary osteoarthritis, left hip: Secondary | ICD-10-CM | POA: Diagnosis not present

## 2024-10-20 ENCOUNTER — Ambulatory Visit

## 2024-10-26 ENCOUNTER — Ambulatory Visit: Admitting: Podiatry

## 2024-11-12 ENCOUNTER — Ambulatory Visit: Admitting: Urology

## 2024-11-15 ENCOUNTER — Other Ambulatory Visit (INDEPENDENT_AMBULATORY_CARE_PROVIDER_SITE_OTHER)

## 2024-11-15 ENCOUNTER — Encounter: Payer: Self-pay | Admitting: Urology

## 2024-11-15 ENCOUNTER — Ambulatory Visit: Payer: Self-pay

## 2024-11-15 ENCOUNTER — Telehealth: Payer: Self-pay | Admitting: *Deleted

## 2024-11-15 ENCOUNTER — Other Ambulatory Visit: Payer: Self-pay

## 2024-11-15 DIAGNOSIS — R300 Dysuria: Secondary | ICD-10-CM | POA: Diagnosis not present

## 2024-11-15 LAB — URINALYSIS, ROUTINE W REFLEX MICROSCOPIC
Bilirubin Urine: NEGATIVE
Ketones, ur: NEGATIVE
Nitrite: POSITIVE — AB
Specific Gravity, Urine: 1.015 (ref 1.000–1.030)
Total Protein, Urine: 30 — AB
Urine Glucose: NEGATIVE
Urobilinogen, UA: 0.2 (ref 0.0–1.0)
pH: 7.5 (ref 5.0–8.0)

## 2024-11-15 NOTE — Telephone Encounter (Signed)
 FYI Only or Action Required?: FYI only for provider: appointment scheduled on 11/16/24.  Patient was last seen in primary care on 10/04/2024 by Abbey Bruckner, MD.  Called Nurse Triage reporting Dysuria.  Symptoms began several days ago.  Interventions attempted: Nothing.  Symptoms are: unchanged.  Triage Disposition: No disposition on file.  Patient/caregiver understands and will follow disposition?:   Writer spoke with the pt. [name] - labs were completed and pt scheduled for virtual visit tomorrow 11/16/24. Patient has no further questions or concerns.   Copied from CRM #[id number]. Topic: Clinical - Red Word Triage >> Nov 15, 2024  9:30 AM Maria Spencer wrote: Red Word that prompted transfer to Nurse Triage: patient currently experiencing UTI symptoms per pt she is normally referred to urology she had an appt scheduled on Friday but  missed appt due to flu... currently still experience symptoms.... burning with urination. urine frequency and back pain.

## 2024-11-15 NOTE — Telephone Encounter (Signed)
 Agree with UA and urine culture. Management pending lab results.  Luke Shade, MD

## 2024-11-15 NOTE — Telephone Encounter (Signed)
 Pt walked in to report that she has UTI sx's and went to urgent care and they have a 3 hour wait and she didn't want to get sick in the waiting room.  Pt C/O sx's x 5 days: burning, back pain, dysuria, & urine cloudy. Pt denies vaginal discharge or itching.  A urine was obtained today and sent off for a urinalysis with micro and a urine culture.  Pt was also scheduled for a virtual visit with Dr Abbey on Tuesday 12/23 @ 4pm.  Pt advised to seek medical care sooner if sx's worsen and that she can also try [facility] clinic walk-in, next care, [place] urgent care, etc.  Vitals were also obtained today for tomorrow's visit:  BP: 164/79 Pulse:73 O2: 96 Temp: 98

## 2024-11-16 ENCOUNTER — Telehealth

## 2024-11-16 ENCOUNTER — Ambulatory Visit: Payer: Self-pay

## 2024-11-16 ENCOUNTER — Telehealth: Payer: Self-pay

## 2024-11-16 VITALS — BP 164/79 | HR 73 | Temp 98.1°F | Ht 59.0 in | Wt 139.2 lb

## 2024-11-16 DIAGNOSIS — N3001 Acute cystitis with hematuria: Secondary | ICD-10-CM

## 2024-11-16 MED ORDER — CIPROFLOXACIN HCL 250 MG PO TABS: 250.0000 mg | ORAL_TABLET | Freq: Two times a day (BID) | ORAL | 0 refills | Status: AC

## 2024-11-16 MED ORDER — FLUCONAZOLE 150 MG PO TABS: 150.0000 mg | ORAL_TABLET | Freq: Every day | ORAL | 0 refills | Status: DC

## 2024-11-16 NOTE — Assessment & Plan Note (Signed)
 UTI suspected on recent UA, result discussed with the patient. Allergies to sulfa antibiotics and Augmentin  listed in her chart. Start Ciprofloxacin  250 mg twice a day for 5 days. Await urine culture results to adjust antibiotics if necessary. Advised emergency care if worsening back pain, fever, or vomiting. Recommended probiotic midday to reduce antibiotic-related diarrhea. Prescribed Fluconazole  150 mg due to h/o yeast infections post-antibiotic treatment for UTIs. Prescribed fluconazole , one tablet at antibiotic start and a second dose if symptoms occur 72 hours later.

## 2024-11-16 NOTE — Progress Notes (Signed)
 "  Virtual Visit via Video Note  I connected with Avelina Hope on 11/16/2024 at  4:00 PM EST by a video enabled telemedicine application and verified that I am speaking with the correct person using two identifiers.  Patient Location: Home Provider Location: Office/Clinic  I discussed the limitations, risks, security, and privacy concerns of performing an evaluation and management service by video and the availability of in person appointments. I also discussed with the patient that there may be a patient responsible charge related to this service. The patient expressed understanding and agreed to proceed.  Subjective: PCP: Abbey Bruckner, MD  Chief Complaint  Patient presents with   Urinary Tract Infection   HPI:  Maria Spencer is a 78 year old female with recurrent urinary tract infections who presents virtually with symptoms of a urinary tract infection.  She has a history of recurrent urinary tract infections, with the current episode beginning on Thursday. Initially, she felt better and canceled an appointment with her urologist on Friday. However, by Monday, her symptoms had worsened significantly, prompting her to seek care. She attempted to visit urgent care but faced a long wait time and decided to come to this office instead.  Her symptoms include lower back pain, stomach cramping, cloudy and foul-smelling urine, and dysuria. She has been trying to stay hydrated by drinking cranberry juice and water . She also took azithromycin  to alleviate some of the pain, although she did not take it initially to ensure a urine sample could be collected. She tends to get vaginal yeast infection on antibiotic so gets fluconazole  when taking antibiotics.   She is lactose intolerant.    ROS: Per HPI Current Medications[1]  Observations/Objective: Today's Vitals   11/16/24 1444  BP: (!) 164/79  Pulse: 73  Temp: 98.1 F (36.7 C)  TempSrc: Oral  SpO2: 96%  Weight: 139 lb 3.2 oz  (63.1 kg)  Height: 4' 11 (1.499 m)   Physical Exam Constitutional:      General: She is not in acute distress. Neurological:     Mental Status: She is alert and oriented to person, place, and time.  Psychiatric:        Mood and Affect: Mood normal.    Following lab results discussed:  Component     Latest Ref Rng 11/15/2024  Color, Urine     Yellow;Lt. Yellow;Straw;Dark Yellow;Amber;Green;Red;Brown  YELLOW   Appearance     Clear;Turbid;Slightly Cloudy;Cloudy  Cloudy !   Specific Gravity, Urine     1.000 - 1.030  1.015   pH     5.0 - 8.0  7.5   Total Protein, Urine-UPE24     Negative  30 !   Urine Glucose     Negative  NEGATIVE   Ketones, ur     Negative  NEGATIVE   Bilirubin Urine     Negative  NEGATIVE   Hgb urine dipstick     Negative  TRACE-INTACT !   Urobilinogen, UA     0.0 - 1.0  0.2   Leukocytes,Ua     Negative  LARGE !   Nitrite     Negative  POSITIVE !   WBC, UA     0-2/hpf  TNTC(>50/hpf) !   RBC / HPF     0-2/hpf  0-2/hpf   Mucus, UA     None  Presence of !   Squamous Epithelial / HPF     Rare(0-4/hpf)  Rare(0-4/hpf)   Renal Epithel, UA     None  Rare(0-4/hpf) !   Bacteria, UA     None  Many(>50/hpf) !      Assessment and Plan: Acute cystitis with hematuria Assessment & Plan: UTI suspected on recent UA, result discussed with the patient. Allergies to sulfa antibiotics and Augmentin  listed in her chart. Start Ciprofloxacin  250 mg twice a day for 5 days. Await urine culture results to adjust antibiotics if necessary. Advised emergency care if worsening back pain, fever, or vomiting. Recommended probiotic midday to reduce antibiotic-related diarrhea. Prescribed Fluconazole  150 mg due to h/o yeast infections post-antibiotic treatment for UTIs. Prescribed fluconazole , one tablet at antibiotic start and a second dose if symptoms occur 72 hours later.  Orders: -     Ciprofloxacin  HCl; Take 1 tablet (250 mg total) by mouth 2 (two) times daily for 5 days.   Dispense: 10 tablet; Refill: 0 -     Fluconazole ; Take 1 tablet (150 mg total) by mouth daily.  Dispense: 2 tablet; Refill: 0    Follow Up Instructions: Return if symptoms worsen or fail to improve.   I discussed the assessment and treatment plan with the patient. The patient was provided an opportunity to ask questions, and all were answered. The patient agreed with the plan and demonstrated an understanding of the instructions.   The patient was advised to call back or seek an in-person evaluation if the symptoms worsen or if the condition fails to improve as anticipated.  The above assessment and management plan was discussed with the patient. The patient verbalized understanding of and has agreed to the management plan.   Nic Lampe, MD    [1]  Current Outpatient Medications:    atorvastatin  (LIPITOR) 10 MG tablet, Take 1 tablet (10 mg total) by mouth daily., Disp: 90 tablet, Rfl: 3   ciprofloxacin  (CIPRO ) 250 MG tablet, Take 1 tablet (250 mg total) by mouth 2 (two) times daily for 5 days., Disp: 10 tablet, Rfl: 0   fluconazole  (DIFLUCAN ) 150 MG tablet, Take 1 tablet (150 mg total) by mouth daily., Disp: 2 tablet, Rfl: 0   levothyroxine  (SYNTHROID ) 88 MCG tablet, Take 1 tablet (88 mcg total) by mouth daily before breakfast. 30 minutes, Disp: 90 tablet, Rfl: 0   metoprolol  succinate (TOPROL -XL) 50 MG 24 hr tablet, Take 1 tablet (50 mg total) by mouth daily. Take with or immediately following a meal., Disp: 90 tablet, Rfl: 3   Multiple Vitamin (MULTI-VITAMINS) TABS, Take 1 tablet by mouth daily., Disp: , Rfl:    telmisartan  (MICARDIS ) 80 MG tablet, Take 1 tablet (80 mg total) by mouth daily., Disp: 90 tablet, Rfl: 3   zoledronic  acid (RECLAST ) 5 MG/100ML SOLN injection, Inject 5 mg into the vein., Disp: , Rfl:   "

## 2024-11-16 NOTE — Telephone Encounter (Signed)
 LVM to call back to begin Video Visit

## 2024-11-16 NOTE — Progress Notes (Signed)
 Result discussed during visit on 11/16/24.   Luke Shade, MD

## 2024-11-17 LAB — URINE CULTURE
MICRO NUMBER:: 17386172
SPECIMEN QUALITY:: ADEQUATE

## 2024-11-19 NOTE — Progress Notes (Signed)
 Talked to the patient on the phone, updated her on urine C/S results. She is feeling better. Recommend finishing Ciprofloxacin  as prescribed. Counseled on Fluconazole  if she develops vaginitis symptoms (eg but not limited to cottage cheese like vagina discharge, pruritus). Patient verbalized understanding.   Luke Shade, MD

## 2024-11-23 ENCOUNTER — Telehealth: Payer: Self-pay

## 2024-11-23 NOTE — Telephone Encounter (Signed)
 Left VM and MyChart message to let pt know appt needs to be rescheduled from 03/04/2025.   E2C2, if this pt calls back please reschedule their follow-up appointment -kh

## 2024-12-07 ENCOUNTER — Ambulatory Visit: Admitting: Podiatry

## 2024-12-14 ENCOUNTER — Ambulatory Visit (INDEPENDENT_AMBULATORY_CARE_PROVIDER_SITE_OTHER): Admitting: *Deleted

## 2024-12-14 VITALS — Ht 59.0 in | Wt 141.0 lb

## 2024-12-14 DIAGNOSIS — Z0000 Encounter for general adult medical examination without abnormal findings: Secondary | ICD-10-CM | POA: Diagnosis not present

## 2024-12-14 NOTE — Progress Notes (Signed)
 "  Chief Complaint  Patient presents with   Medicare Wellness     Subjective:   Maria Spencer is a 79 y.o. female who presents for a Medicare Annual Wellness Visit.  Visit info / Clinical Intake: Medicare Wellness Visit Type:: Subsequent Annual Wellness Visit Persons participating in visit and providing information:: patient Medicare Wellness Visit Mode:: Video Since this visit was completed virtually, some vitals may be partially provided or unavailable. Missing vitals are due to the limitations of the virtual format.: Documented vitals are patient reported If Telephone or Video please confirm:: I connected with patient using audio/video enable telemedicine. I verified patient identity with two identifiers, discussed telehealth limitations, and patient agreed to proceed. Patient Location:: Home Provider Location:: Office/Home Interpreter Needed?: No Pre-visit prep was completed: yes AWV questionnaire completed by patient prior to visit?: no Living arrangements:: (!) lives alone Patient's Overall Health Status Rating: (!) fair Typical amount of pain: some (hip pain, arthritis for several years after a fall) Does pain affect daily life?: (!) yes Are you currently prescribed opioids?: no  Dietary Habits and Nutritional Risks How many meals a day?: 3 Eats fruit and vegetables daily?: yes Most meals are obtained by: preparing own meals In the last 2 weeks, have you had any of the following?: none Diabetic:: no  Functional Status Activities of Daily Living (to include ambulation/medication): Independent Ambulation: Independent with device- listed below Home Assistive Devices/Equipment: [place] (when going out) Medication Administration: Independent Home Management (perform basic housework or laundry): Independent Manage your own finances?: yes Primary transportation is: driving Concerns about vision?: no *vision screening is required for WTM* Concerns about hearing?: no  Fall  Screening Falls in the past year?: 0 Number of falls in past year: 0 Was there an injury with Fall?: 0 Fall Risk Category Calculator: 0 Patient Fall Risk Level: Low Fall Risk  Fall Risk Patient at Risk for Falls Due to: No Fall Risks Fall risk Follow up: Falls evaluation completed; Falls prevention discussed  Home and Transportation Safety: All rugs have non-skid backing?: yes All stairs or steps have railings?: yes Grab bars in the bathtub or shower?: yes Have non-skid surface in bathtub or shower?: yes Good home lighting?: yes Regular seat belt use?: yes Hospital stays in the last year:: no  Cognitive Assessment Difficulty concentrating, remembering, or making decisions? : no Will 6CIT or Mini Cog be Completed: yes What year is it?: 0 points What month is it?: 0 points Give patient an address phrase to remember (5 components): [address] [place] About what time is it?: 0 points Count backwards from 20 to 1: 0 points Say the months of the year in reverse: 0 points Repeat the address phrase from earlier: 2 points 6 CIT Score: 2 points  Advance Directives (For Healthcare) Does Patient Have a Medical Advance Directive?: Yes Does patient want to make changes to medical advance directive?: No - Patient declined Type of Advance Directive: Healthcare Power of [place]; Living will Copy of Healthcare Power of Attorney in Chart?: Yes - validated most recent copy scanned in chart (See row information) Copy of Living Will in Chart?: Yes - validated most recent copy scanned in chart (See row information)  Reviewed/Updated  Reviewed/Updated: Reviewed All (Medical, Surgical, Family, Medications, Allergies, Care Teams, Patient Goals)    Allergies (verified) Sulfa antibiotics, Nitrofuran derivatives, Amlodipine  besylate, and Augmentin  [amoxicillin -pot clavulanate]   Current Medications (verified) Outpatient Encounter Medications as of 12/14/2024  Medication Sig    atorvastatin  (LIPITOR) 10 MG tablet  Take 1 tablet (10 mg total) by mouth daily.   Cyanocobalamin  (VITAMIN B 12) 500 MCG TABS Take by mouth daily.   levothyroxine  (SYNTHROID ) 88 MCG tablet Take 1 tablet (88 mcg total) by mouth daily before breakfast. 30 minutes   metoprolol  succinate (TOPROL -XL) 50 MG 24 hr tablet Take 1 tablet (50 mg total) by mouth daily. Take with or immediately following a meal.   Multiple Vitamin (MULTI-VITAMINS) TABS Take 1 tablet by mouth daily.   telmisartan  (MICARDIS ) 80 MG tablet Take 1 tablet (80 mg total) by mouth daily.   URINARY HEALTH/CRANBERRY PO Take by mouth daily.   zoledronic  acid (RECLAST ) 5 MG/100ML SOLN injection Inject 5 mg into the vein.   [DISCONTINUED] fluconazole  (DIFLUCAN ) 150 MG tablet Take 1 tablet (150 mg total) by mouth daily.   No facility-administered encounter medications on file as of 12/14/2024.    History: Past Medical History:  Diagnosis Date   Arthritis    neck, back, left knee;    Breast cancer (HCC) 2009   right lumpectomy s/p radiation x 1 week 2x per day only    Chicken pox    Chronic UTI    established with urology    COVID-19    ? 12/2020 sob  took antiviral, 08/15/21   Diverticular disease    -osis and -itis    Diverticulitis    Esophagitis    egd 05/17/14 see report scanned into chart   GERD (gastroesophageal reflux disease)    h/o   History of kidney stones    Hormone disorder    Hyperlipidemia    Hypertension    controlled well with medication;    Hypothyroidism    LVH (left ventricular hypertrophy)    mild   MR (mitral regurgitation)    Osteoporosis    PAC (premature atrial contraction)    Personal history of radiation therapy 2009   F/U right breast cancer   PSVT (paroxysmal supraventricular tachycardia)    PVC (premature ventricular contraction)    Sepsis (HCC)    Thyroid  disease    hypothyroidism    Past Surgical History:  Procedure Laterality Date   BONE GRAFT HIP ILIAC CREST  2018   + cage left  hip 10/23/17    BREAST BIOPSY Left 2009   clip,benign   BREAST BIOPSY Right 2009   +   BREAST LUMPECTOMY Right 2009   2009 lumpectomy    CARDIAC CATHETERIZATION  2001   No stents;  results were WNL Dr. requested it for HTN   CATARACT EXTRACTION, BILATERAL Bilateral 10/2019   COLON SURGERY  03/2023   COLONOSCOPY WITH PROPOFOL  N/A 05/15/2020   Procedure: COLONOSCOPY WITH PROPOFOL ;  Surgeon: Unk Corinn Skiff, MD;  Location: [facility];  Service: Gastroenterology;  Laterality: N/A;   EYE SURGERY  2020   Cateracts   HARDWARE REMOVAL N/A 03/12/2023   Procedure: REMOVAL OF ILIAC SCREWS;  Surgeon: Clois Fret, MD;  Location: [facility];  Service: Neurosurgery;  Laterality: N/A;   HERNIA REPAIR  2019   INSERTION OF MESH N/A 05/29/2018   Procedure: INSERTION OF MESH;  Surgeon: Gladis Cough, MD;  Location: [facility];  Service: General;  Laterality: N/A;   JOINT REPLACEMENT  2012   Right Shoulder   QUADRICEPS TENDON REPAIR Right 12/05/2020   Procedure: OPEN REPAIR OF RIGHT GLUTEUS MINIMUS TENDON;  Surgeon: Edie Norleen PARAS, MD;  Location: [facility];  Service: Orthopedics;  Laterality: Right;   right gluteus medius/minimus repair Dr. Edie 11/2020  Dr. Edie   SHOULDER SURGERY Left 2008, 2009   x2 rotator cuff surgeries left shoulders   SPINAL FUSION  07/24/2020   SPINE SURGERY  10/23/2017   spinal 09/2017 h/o scoliosis [facility] L4-S1 OLIF T10 to ililum fusion   TONSILLECTOMY     age 30 y.o.    TOTAL KNEE ARTHROPLASTY Left 03/18/2022   Procedure: TOTAL KNEE ARTHROPLASTY;  Surgeon: Edna Toribio LABOR, MD;  Location: [facility];  Service: Orthopedics;  Laterality: Left;   TOTAL SHOULDER REPLACEMENT Right 2012   TUBAL LIGATION     age 22   VENTRAL HERNIA REPAIR N/A 05/29/2018   Procedure: LAPAROSCOPIC VENTRAL / LATERAL HERNIA REPAIR;  Surgeon: Gladis Cough, MD;  Location: [facility];  Service: General;  Laterality: N/A;   Family History  Problem Relation Age of Onset   Arthritis Mother     Heart disease Mother 55       stent    Hyperlipidemia Mother    Hypertension Mother    Coronary artery disease Mother 75   Arthritis Father    Diabetes Father    Cancer Father        colon   AAA (abdominal aortic aneurysm) Father    Arthritis Sister    Hyperlipidemia Sister    Hypertension Sister    Cancer Brother        lung, smoker   Mental retardation Brother    Drug abuse Daughter        overdose in 2018    Arthritis Sister    Hyperlipidemia Sister    Bladder Cancer Neg Hx    Kidney cancer Neg Hx    Breast cancer Neg Hx    Social History   Occupational History   Not on file  Tobacco Use   Smoking status: Never    Passive exposure: Never   Smokeless tobacco: Never  Vaping Use   Vaping status: Never Used  Substance and Sexual Activity   Alcohol use: Not Currently    Comment: rare   Drug use: No   Sexual activity: Yes    Birth control/protection: Post-menopausal   Tobacco Counseling Counseling given: Not Answered  SDOH Screenings   Food Insecurity: No Food Insecurity (12/14/2024)  Housing: Low Risk (12/14/2024)  Transportation Needs: No Transportation Needs (12/14/2024)  Utilities: Not At Risk (12/14/2024)  Alcohol Screen: Low Risk (12/14/2024)  Depression (PHQ2-9): Low Risk (12/14/2024)  Financial Resource Strain: Low Risk (12/14/2024)  Physical Activity: Insufficiently Active (12/14/2024)  Social Connections: Moderately Integrated (12/14/2024)  Stress: No Stress Concern Present (12/14/2024)  Tobacco Use: Low Risk (12/14/2024)  Health Literacy: Adequate Health Literacy (12/14/2024)   See flowsheets for full screening details  Depression Screen PHQ 2 & 9 Depression Scale- Over the past 2 weeks, how often have you been bothered by any of the following problems? Little interest or pleasure in doing things: 0 Feeling down, depressed, or hopeless (PHQ Adolescent also includes...irritable): 0 PHQ-2 Total Score: 0 Trouble falling or staying asleep, or sleeping too  much: 1 Feeling tired or having little energy: 0 Poor appetite or overeating (PHQ Adolescent also includes...weight loss): 0 Feeling bad about yourself - or that you are a failure or have let yourself or your family down: 0 Trouble concentrating on things, such as reading the newspaper or watching television (PHQ Adolescent also includes...like school work): 0 Moving or speaking so slowly that other people could have noticed. Or the opposite - being so fidgety or restless that you have been moving around a lot  more than usual: 0 Thoughts that you would be better off dead, or of hurting yourself in some way: 0 PHQ-9 Total Score: 1 If you checked off any problems, how difficult have these problems made it for you to do your work, take care of things at home, or get along with other people?: Not difficult at all     Goals Addressed             This Visit's Progress    Patient Stated       Needs to exercise and build muscle             Objective:    Today's Vitals   12/14/24 1056  Weight: 141 lb (64 kg)  Height: 4' 11 (1.499 m)   Body mass index is 28.48 kg/m.  Hearing/Vision screen Hearing Screening - Comments:: No issues Vision Screening - Comments:: Readers, Dr. Craig, [place] , up to date Immunizations and Health Maintenance Health Maintenance  Topic Date Due   COVID-19 Vaccine (10 - Pfizer risk 2025-26 season) 03/01/2025   Mammogram  06/22/2025   Medicare Annual Wellness (AWV)  12/14/2025   Colonoscopy  05/16/2027   DTaP/Tdap/Td (4 - Td or Tdap) 06/25/2032   Pneumococcal Vaccine: 50+ Years  Completed   Influenza Vaccine  Completed   Bone Density Scan  Completed   Hepatitis C Screening  Completed   Zoster Vaccines- Shingrix   Completed   Meningococcal B Vaccine  Aged Out        Assessment/Plan:  This is a routine wellness examination for Maria Spencer.  Patient Care Team: Bair, Kalpana, MD as PCP - General (Family Medicine) End, Lonni, MD as PCP -  Cardiology (Cardiology) Gladis Cough, MD as Consulting Physician (General Surgery) Judson, Melissa E, NP as Nurse Practitioner (Neurosurgery) [facility], Haroldine LABOR, MD as Consulting Physician (Neurosurgery) Laboccetta, Lydia, MD as Consulting Physician (Urology)  I have personally reviewed and noted the following in the patients chart:   Medical and social history Use of alcohol, tobacco or illicit drugs  Current medications and supplements including opioid prescriptions. Functional ability and status Nutritional status Physical activity Advanced directives List of other physicians Hospitalizations, surgeries, and ER visits in previous 12 months Vitals Screenings to include cognitive, depression, and falls Referrals and appointments  No orders of the defined types were placed in this encounter.  In addition, I have reviewed and discussed with patient certain preventive protocols, quality metrics, and best practice recommendations. A written personalized care plan for preventive services as well as general preventive health recommendations were provided to patient.   Angeline Fredericks, LPN   8/79/7973   Return in 1 year (on 12/14/2025).  After Visit Summary: (MyChart) Due to this being a telephonic visit, the after visit summary with patients personalized plan was offered to patient via MyChart   Nurse Notes: Discussed the need to keep vaccines up to date. "

## 2024-12-14 NOTE — Patient Instructions (Signed)
 Maria Spencer,  Thank you for taking the time for your Medicare Wellness Visit. I appreciate your continued commitment to your health goals. Please review the care plan we discussed, and feel free to reach out if I can assist you further.  Please note that Annual Wellness Visits do not include a physical exam. Some assessments may be limited, especially if the visit was conducted virtually. If needed, we may recommend an in-person follow-up with your provider.  Ongoing Care Seeing your primary care provider every 3 to 6 months helps us  monitor your health and provide consistent, personalized care.  Remember to keep your vaccines up to date.   Referrals If a referral was made during today's visit and you haven't received any updates within two weeks, please contact the referred provider directly to check on the status.  Recommended Screenings:  Health Maintenance  Topic Date Due   COVID-19 Vaccine (10 - Pfizer risk 2025-26 season) 03/01/2025   Breast Cancer Screening  06/22/2025   Medicare Annual Wellness Visit  12/14/2025   Colon Cancer Screening  05/16/2027   DTaP/Tdap/Td vaccine (4 - Td or Tdap) 06/25/2032   Pneumococcal Vaccine for age over 16  Completed   Flu Shot  Completed   Osteoporosis screening with Bone Density Scan  Completed   Hepatitis C Screening  Completed   Zoster (Shingles) Vaccine  Completed   Meningitis B Vaccine  Aged Out       12/14/2024   11:02 AM  Advanced Directives  Does Patient Have a Medical Advance Directive? Yes  Type of Estate Agent of [place];Living will  Does patient want to make changes to medical advance directive? No - Patient declined  Copy of Healthcare Power of Attorney in Chart? Yes - validated most recent copy scanned in chart (See row information)    Vision: Annual vision screenings are recommended for early detection of glaucoma, cataracts, and diabetic retinopathy. These exams can also reveal signs of chronic  conditions such as diabetes and high blood pressure.  Dental: Annual dental screenings help detect early signs of oral cancer, gum disease, and other conditions linked to overall health, including heart disease and diabetes.  Please see the attached documents for additional preventive care recommendations.

## 2024-12-15 ENCOUNTER — Other Ambulatory Visit: Payer: Self-pay

## 2024-12-15 DIAGNOSIS — E039 Hypothyroidism, unspecified: Secondary | ICD-10-CM

## 2024-12-18 ENCOUNTER — Ambulatory Visit
Admission: EM | Admit: 2024-12-18 | Discharge: 2024-12-18 | Disposition: A | Discharge status: Home / Self Care | Attending: Emergency Medicine | Admitting: Emergency Medicine

## 2024-12-18 ENCOUNTER — Encounter: Payer: Self-pay | Admitting: Emergency Medicine

## 2024-12-18 DIAGNOSIS — I10 Essential (primary) hypertension: Secondary | ICD-10-CM | POA: Diagnosis not present

## 2024-12-18 DIAGNOSIS — B349 Viral infection, unspecified: Secondary | ICD-10-CM | POA: Diagnosis not present

## 2024-12-18 LAB — POCT RAPID STREP A (OFFICE): Rapid Strep A Screen: NEGATIVE

## 2024-12-18 LAB — POC COVID19/FLU A&B COMBO
Covid Antigen, POC: NEGATIVE
Influenza A Antigen, POC: NEGATIVE
Influenza B Antigen, POC: NEGATIVE

## 2024-12-18 MED ORDER — AZITHROMYCIN 250 MG PO TABS: 250.0000 mg | ORAL_TABLET | Freq: Every day | ORAL | 0 refills | Status: AC

## 2024-12-18 NOTE — ED Provider Notes (Signed)
 " CAY RALPH PELT    CSN: [id number] Arrival date & time: 12/18/24  0827      History   Chief Complaint Chief Complaint  Patient presents with   URI    HPI Maria Spencer is a 79 y.o. female.  Patient presents on day 4 of fever, chills, sore throat, postnasal drainage, cough, decreased appetite.  No vomiting but 2 episodes of diarrhea yesterday.  Patient reports temp of 101 during the night.  No OTC medications taken today.  No shortness of breath or chest pain.  The history is provided by the patient and medical records.    Past Medical History:  Diagnosis Date   Arthritis    neck, back, left knee;    Breast cancer (HCC) 2009   right lumpectomy s/p radiation x 1 week 2x per day only    Chicken pox    Chronic UTI    established with urology    COVID-19    ? 12/2020 sob  took antiviral, 08/15/21   Diverticular disease    -osis and -itis    Diverticulitis    Esophagitis    egd 05/17/14 see report scanned into chart   GERD (gastroesophageal reflux disease)    h/o   History of kidney stones    Hormone disorder    Hyperlipidemia    Hypertension    controlled well with medication;    Hypothyroidism    LVH (left ventricular hypertrophy)    mild   MR (mitral regurgitation)    Osteoporosis    PAC (premature atrial contraction)    Personal history of radiation therapy 2009   F/U right breast cancer   PSVT (paroxysmal supraventricular tachycardia)    PVC (premature ventricular contraction)    Sepsis (HCC)    Thyroid  disease    hypothyroidism     Patient Active Problem List   Diagnosis Date Noted   Acute cystitis with hematuria 11/16/2024   Nail abnormalities 10/04/2024   Low serum vitamin B12 10/04/2024   Epigastric abdominal pain 10/04/2024   Abnormal glucose 10/04/2024   Greater trochanteric pain syndrome of both lower extremities 08/17/2024   Osteoarthritis of first carpometacarpal joint of hand (Left) 07/06/2024   Chronic hand pain (Left) 07/06/2024    Chronic thumb pain (Left) 06/30/2024   History of total right knee replacement 11/12/2023   Osteoarthritis of right knee 10/28/2023   Full thickness rotator cuff tear 08/05/2023   Cervical radiculopathy 08/05/2023   Lumbar spondylosis 08/05/2023   Lumbosacral radiculitis 08/05/2023   Degenerative joint disease of shoulder region 08/05/2023   Long term prescription opiate use 08/05/2023   Mechanical low back pain 05/11/2023   Status post laparoscopic colectomy 04/29/2023   Sigmoid diverticulitis 04/16/2023   Cervical facet joint pain 03/25/2023   Failed orthopedic implant 03/12/2023   S/P spinal fusion 03/12/2023   S/P hardware removal 03/12/2023   Arthritis of right knee 01/27/2023   Arthralgia of right knee 01/27/2023   Acute recurrent maxillary sinusitis 12/17/2022   Weakness of both lower extremities 06/26/2022   Localized osteoarthritis of left knee 03/18/2022   Partial tear of right hamstring 09/25/2021   Tendinopathy of left gluteus medius 08/31/2021   Osteoarthritis of knee (Left) 03/05/2021   Prediabetes 02/06/2021   Chronic bursitis of shoulder area (Right) 11/09/2020   Chronic shoulder pain (Right) 11/09/2020   Shoulder blade pain (Right) 11/09/2020   Scoliosis of thoracic spine, unspecified scoliosis type 11/08/2020   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/20/2017) 11/08/2020  Spondylosis without myelopathy or radiculopathy, cervical region 10/26/2020   Abnormal MRI, cervical spine (08/06/2022) 10/23/2020   Cervical facet syndrome (Bilateral) 10/23/2020   Cervicogenic headache (Bilateral) 10/23/2020   Occipital neuralgia (greater occipital nerve) (Bilateral) 10/23/2020   Cervico-occipital neuralgia (Bilateral) 10/23/2020   Cervical facet hypertrophy (Multilevel) (Bilateral) 10/23/2020   Tear of right gluteus minimus tendon 10/06/2020   Tear of right gluteus medius tendon 10/06/2020   Osteoarthritis of AC (acromioclavicular) joints (Bilateral)  10/05/2020   Chronic Acromioclavicular (AC) joint pain (Bilateral) 10/05/2020   Osteoarthritis of glenohumeral joint (Left) 10/05/2020   Chronic shoulder pain (Bilateral) (R>L) 10/02/2020   Chronic shoulder pain after shoulder replacement (Right) 10/02/2020   Cervicalgia 09/13/2020   Chronic upper extremity pain (Right) 09/13/2020   Cervical spondylosis with radiculopathy (Right) 09/13/2020   Cervical radiculopathy at C7 (Right) 09/13/2020   Diverticular disease 07/13/2020   Shortness of breath 07/13/2020   GERD (gastroesophageal reflux disease) 07/13/2020   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) 06/22/2020   DDD (degenerative disc disease), cervical 06/22/2020   DDD (degenerative disc disease), thoracic 06/22/2020   DDD (degenerative disc disease), lumbar 06/22/2020   Displacement of thoracic intervertebral disc (C6-T3, T4-5, T6-7, T8-9) 06/22/2020   Non-traumatic compression fracture of T8 thoracic vertebra, sequela 06/22/2020   Spondylosis without myelopathy or radiculopathy, thoracic region 05/11/2020   Closed wedge compression fracture of T8 vertebra (HCC) 05/11/2020   Osteoarthritis of hips (Bilateral) 05/11/2020   Abnormal thoracolumbar CT myelogram (01/05/2020) 05/11/2020   Chronic pain syndrome 05/08/2020   Disorder of skeletal system 05/08/2020   Chronic lower extremity pain (3ry area of Pain) (Right) 05/08/2020   Post-menopausal osteoporosis 01/14/2020   Chronic thoracic back pain (1ry area of Pain) (Bilateral) (R>L) 01/14/2020   Fusion of spine of lumbosacral region 12/13/2019   Aortic atherosclerosis 09/17/2019   Chronic hip pain (2ry area of Pain) (Bilateral) (R>L) 09/13/2019   Trochanteric bursitis of right hip 08/10/2019   Arthritis 01/07/2018   IBS (irritable bowel syndrome) 12/29/2017   History of breast cancer 12/29/2017   Degeneration of lumbar intervertebral disc 11/08/2015   Lumbosacral radiculopathy 11/08/2015   Lumbosacral spondylosis without  myelopathy 11/08/2015   Idiopathic peripheral neuropathy 06/14/2014   Hypothyroidism 02/15/2013   Hyperlipidemia 02/15/2013   Essential hypertension 02/15/2013   Acquired spondylolisthesis 06/03/2012   Family history of diabetes mellitus 06/03/2012   Family history of musculoskeletal disease 06/03/2012   Spinal stenosis of lumbar region 06/03/2012    Past Surgical History:  Procedure Laterality Date   BONE GRAFT HIP ILIAC CREST  2018   + cage left hip 10/23/17    BREAST BIOPSY Left 2009   clip,benign   BREAST BIOPSY Right 2009   +   BREAST LUMPECTOMY Right 2009   2009 lumpectomy    CARDIAC CATHETERIZATION  2001   No stents;  results were WNL Dr. requested it for HTN   CATARACT EXTRACTION, BILATERAL Bilateral 10/2019   COLON SURGERY  03/2023   COLONOSCOPY WITH PROPOFOL  N/A 05/15/2020   Procedure: COLONOSCOPY WITH PROPOFOL ;  Surgeon: Unk Corinn Skiff, MD;  Location: [facility] ENDOSCOPY;  Service: Gastroenterology;  Laterality: N/A;   EYE SURGERY  2020   [employer]   HARDWARE REMOVAL N/A 03/12/2023   Procedure: REMOVAL OF ILIAC SCREWS;  Surgeon: Clois Fret, MD;  Location: [facility] ORS;  Service: Neurosurgery;  Laterality: N/A;   HERNIA REPAIR  2019   INSERTION OF MESH N/A 05/29/2018   Procedure: INSERTION OF MESH;  Surgeon: Gladis Cough, MD;  Location: [facility] ORS;  Service: General;  Laterality: N/A;   JOINT REPLACEMENT  2012   Right Shoulder   QUADRICEPS TENDON REPAIR Right 12/05/2020   Procedure: OPEN REPAIR OF RIGHT GLUTEUS MINIMUS TENDON;  Surgeon: Edie Norleen PARAS, MD;  Location: [facility] ORS;  Service: Orthopedics;  Laterality: Right;   right gluteus medius/minimus repair Dr. Edie 11/2020      Dr. Edie   SHOULDER SURGERY Left 2008, 2009   x2 rotator cuff surgeries left shoulders   SPINAL FUSION  07/24/2020   SPINE SURGERY  10/23/2017   spinal 09/2017 h/o scoliosis [facility] L4-S1 OLIF T10 to ililum fusion   TONSILLECTOMY     age 89 y.o.    TOTAL KNEE ARTHROPLASTY Left 03/18/2022    Procedure: TOTAL KNEE ARTHROPLASTY;  Surgeon: Edna Toribio LABOR, MD;  Location: [facility] ORS;  Service: Orthopedics;  Laterality: Left;   TOTAL SHOULDER REPLACEMENT Right 2012   TUBAL LIGATION     age 62   VENTRAL HERNIA REPAIR N/A 05/29/2018   Procedure: LAPAROSCOPIC VENTRAL / LATERAL HERNIA REPAIR;  Surgeon: Gladis Cough, MD;  Location: [facility] ORS;  Service: General;  Laterality: N/A;    OB History   No obstetric history on file.      Home Medications    Prior to Admission medications  Medication Sig Start Date End Date Taking? Authorizing Provider  azithromycin  (ZITHROMAX ) 250 MG tablet Take 1 tablet (250 mg total) by mouth daily. Take first 2 tablets together, then 1 every day until finished. 12/18/24  Yes Corlis Burnard DEL, NP  atorvastatin  (LIPITOR) 10 MG tablet Take 1 tablet (10 mg total) by mouth daily. 10/04/24   Bair, Kalpana, MD  Cyanocobalamin  (VITAMIN B 12) 500 MCG TABS Take by mouth daily.    [provider]  levothyroxine  (SYNTHROID ) 88 MCG tablet TAKE 1 TABLET 30 MINUTES BEFORE BREAKFAST DAILY. 12/15/24   Bair, Kalpana, MD  metoprolol  succinate (TOPROL -XL) 50 MG 24 hr tablet Take 1 tablet (50 mg total) by mouth daily. Take with or immediately following a meal. 10/04/24   Bair, Kalpana, MD  Multiple Vitamin (MULTI-VITAMINS) TABS Take 1 tablet by mouth daily.    [provider]  telmisartan  (MICARDIS ) 80 MG tablet Take 1 tablet (80 mg total) by mouth daily. 10/04/24   Bair, Kalpana, MD  URINARY HEALTH/CRANBERRY PO Take by mouth daily.    [provider]  zoledronic  acid (RECLAST ) 5 MG/100ML SOLN injection Inject 5 mg into the vein.    [provider]    Family History Family History  Problem Relation Age of Onset   Arthritis Mother    Heart disease Mother 25       stent    Hyperlipidemia Mother    Hypertension Mother    Coronary artery disease Mother 85   Arthritis Father    Diabetes Father    Cancer Father        colon   AAA  (abdominal aortic aneurysm) Father    Arthritis Sister    Hyperlipidemia Sister    Hypertension Sister    Cancer Brother        lung, smoker   Mental retardation Brother    Drug abuse Daughter        overdose in 2018    Arthritis Sister    Hyperlipidemia Sister    Bladder Cancer Neg Hx    Kidney cancer Neg Hx    Breast cancer Neg Hx     Social History Social History[1]   Allergies   Sulfa antibiotics, Nitrofuran  derivatives, Amlodipine  besylate, and Augmentin  [amoxicillin -pot clavulanate]   Review of Systems Review of Systems  Constitutional:  Positive for chills and fever.  HENT:  Positive for postnasal drip, rhinorrhea and sore throat. Negative for ear pain.   Respiratory:  Positive for cough. Negative for shortness of breath.   Cardiovascular:  Negative for chest pain and palpitations.  Gastrointestinal:  Positive for diarrhea. Negative for abdominal pain and vomiting.     Physical Exam Triage Vital Signs ED Triage Vitals  Encounter Vitals Group     BP 12/18/24 0852 (!) 151/67     Girls Systolic BP Percentile --      Girls Diastolic BP Percentile --      Boys Systolic BP Percentile --      Boys Diastolic BP Percentile --      Pulse Rate 12/18/24 0852 100     Resp 12/18/24 0852 20     Temp 12/18/24 0852 98.5 F (36.9 C)     Temp Source 12/18/24 0852 Oral     SpO2 12/18/24 0852 96 %     Weight 12/18/24 0851 141 lb 1.5 oz (64 kg)     Height --      Head Circumference --      Peak Flow --      Pain Score 12/18/24 0850 8     Pain Loc --      Pain Education --      Exclude from Growth Chart --    No data found.  Updated Vital Signs BP (!) 151/67 (BP Location: Right Arm)   Pulse 100   Temp 98.5 F (36.9 C) (Oral)   Resp 20   Wt 141 lb 1.5 oz (64 kg)   SpO2 96%   BMI 28.50 kg/m   Visual Acuity Right Eye Distance:   Left Eye Distance:   Bilateral Distance:    Right Eye Near:   Left Eye Near:    Bilateral Near:     Physical  Exam Constitutional:      General: She is not in acute distress. HENT:     Right Ear: Tympanic membrane normal.     Left Ear: Tympanic membrane normal.     Nose: Congestion and rhinorrhea present.     Mouth/Throat:     Mouth: Mucous membranes are moist.     Pharynx: Oropharynx is clear.     Comments: PND Cardiovascular:     Rate and Rhythm: Normal rate and regular rhythm.     Heart sounds: Normal heart sounds.  Pulmonary:     Effort: Pulmonary effort is normal. No respiratory distress.     Breath sounds: Normal breath sounds.  Abdominal:     General: Bowel sounds are normal.     Palpations: Abdomen is soft.     Tenderness: There is no abdominal tenderness. There is no guarding or rebound.  Neurological:     Mental Status: She is alert.      UC Treatments / Results  Labs (all labs ordered are listed, but only abnormal results are displayed) Labs Reviewed  POC COVID19/FLU A&B COMBO  POCT RAPID STREP A (OFFICE)    EKG   Radiology No results found.  Procedures Procedures (including critical care time)  Medications Ordered in UC Medications - No data to display  Initial Impression / Assessment and Plan / UC Course  I have reviewed the triage vital signs and the nursing notes.  Pertinent labs & imaging results that were available during my care  of the patient were reviewed by me and considered in my medical decision making (see chart for details).   Viral illness, elevated blood pressure reading with hypertension.  Afebrile.  Lungs are clear and O2 sat is 96% on room air.  Patient is on day 4 of her symptoms.  COVID, flu, strep tests are negative.  Encouraged her to continue symptomatic care for at least another 2 to 3 days.  Zithromax  sent to her pharmacy for use if she is not improving.  Education provided on viral illness.  Also discussed with patient that her blood pressure is elevated today and needs to be rechecked by her PCP.  Education provided on managing  hypertension.  Patient agrees to plan of care. Final Clinical Impressions(s) / UC Diagnoses   Final diagnoses:  Viral illness  Elevated blood pressure reading in office with diagnosis of hypertension     Discharge Instructions      Your COVID, flu, and strep test are negative.  Continue symptomatic care for the next 2 to 3 days.  If your symptoms are not improving, you may start the Zithromax  as directed.  Follow-up with your primary care provider.  Your blood pressure is elevated today at 151/67.  Please have this rechecked by your primary care provider in 2-4 weeks.          ED Prescriptions     Medication Sig Dispense Auth. Provider   azithromycin  (ZITHROMAX ) 250 MG tablet Take 1 tablet (250 mg total) by mouth daily. Take first 2 tablets together, then 1 every day until finished. 6 tablet Corlis Burnard DEL, NP      PDMP not reviewed this encounter.    [1]  Social History Tobacco Use   Smoking status: Never    Passive exposure: Never   Smokeless tobacco: Never  Vaping Use   Vaping status: Never Used  Substance Use Topics   Alcohol use: Not Currently    Comment: rare   Drug use: No     Corlis Burnard DEL, NP 12/18/24 [phone]  "

## 2024-12-18 NOTE — ED Triage Notes (Signed)
 Pt presents c/o URI x 3 days. Pt states,  It started Wednesday with a really bad sore throat and it's progressively getting worse. I have lost my appetite, I have the chills and a cough. There's a lot of congestion in my throat.  Pt denies emesis but does c/o diarrhea.

## 2024-12-18 NOTE — Discharge Instructions (Addendum)
 Your COVID, flu, and strep test are negative.  Continue symptomatic care for the next 2 to 3 days.  If your symptoms are not improving, you may start the Zithromax  as directed.  Follow-up with your primary care provider.  Your blood pressure is elevated today at 151/67.  Please have this rechecked by your primary care provider in 2-4 weeks.

## 2025-03-04 ENCOUNTER — Ambulatory Visit

## 2025-03-10 ENCOUNTER — Ambulatory Visit

## 2025-12-20 ENCOUNTER — Ambulatory Visit
